# Patient Record
Sex: Female | Born: 1935
Health system: Southern US, Community
[De-identification: ages and names within clinical notes are randomized; demographics above are authoritative.]

## PROBLEM LIST (undated history)

## (undated) DIAGNOSIS — J449 Chronic obstructive pulmonary disease, unspecified: Secondary | ICD-10-CM

## (undated) DIAGNOSIS — K219 Gastro-esophageal reflux disease without esophagitis: Secondary | ICD-10-CM

## (undated) DIAGNOSIS — J45909 Unspecified asthma, uncomplicated: Secondary | ICD-10-CM

## (undated) DIAGNOSIS — C569 Malignant neoplasm of unspecified ovary: Secondary | ICD-10-CM

## (undated) DIAGNOSIS — M1990 Unspecified osteoarthritis, unspecified site: Secondary | ICD-10-CM

## (undated) DIAGNOSIS — E119 Type 2 diabetes mellitus without complications: Secondary | ICD-10-CM

## (undated) DIAGNOSIS — R0600 Dyspnea, unspecified: Secondary | ICD-10-CM

## (undated) DIAGNOSIS — H269 Unspecified cataract: Secondary | ICD-10-CM

## (undated) DIAGNOSIS — F419 Anxiety disorder, unspecified: Secondary | ICD-10-CM

## (undated) DIAGNOSIS — E039 Hypothyroidism, unspecified: Secondary | ICD-10-CM

## (undated) DIAGNOSIS — J189 Pneumonia, unspecified organism: Secondary | ICD-10-CM

## (undated) DIAGNOSIS — F32A Depression, unspecified: Secondary | ICD-10-CM

## (undated) DIAGNOSIS — Z8781 Personal history of (healed) traumatic fracture: Secondary | ICD-10-CM

## (undated) DIAGNOSIS — I10 Essential (primary) hypertension: Secondary | ICD-10-CM

## (undated) DIAGNOSIS — M797 Fibromyalgia: Secondary | ICD-10-CM

## (undated) DIAGNOSIS — F329 Major depressive disorder, single episode, unspecified: Secondary | ICD-10-CM

## (undated) DIAGNOSIS — M810 Age-related osteoporosis without current pathological fracture: Secondary | ICD-10-CM

## (undated) DIAGNOSIS — Z9221 Personal history of antineoplastic chemotherapy: Secondary | ICD-10-CM

## (undated) HISTORY — PX: OTHER SURGICAL HISTORY: SHX169

## (undated) HISTORY — PX: ANKLE FRACTURE SURGERY: SHX122

## (undated) HISTORY — PX: CATARACT EXTRACTION: SUR2

## (undated) HISTORY — PX: EYE SURGERY: SHX253

## (undated) HISTORY — PX: OOPHORECTOMY: SHX86

## (undated) HISTORY — DX: Personal history of antineoplastic chemotherapy: Z92.21

## (undated) HISTORY — PX: LUNG SURGERY: SHX703

## (undated) HISTORY — PX: COLONOSCOPY: SHX174

## (undated) HISTORY — DX: Unspecified osteoarthritis, unspecified site: M19.90

## (undated) HISTORY — DX: Personal history of (healed) traumatic fracture: Z87.81

## (undated) HISTORY — DX: Gastro-esophageal reflux disease without esophagitis: K21.9

## (undated) HISTORY — PX: BLADDER SUSPENSION: SHX72

## (undated) HISTORY — PX: TONSILLECTOMY: SUR1361

## (undated) HISTORY — DX: Age-related osteoporosis without current pathological fracture: M81.0

## (undated) HISTORY — PX: UPPER GI ENDOSCOPY: SHX6162

---
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
## 1994-03-22 HISTORY — PX: ABDOMINAL HYSTERECTOMY: SHX81

## 2000-11-13 LAB — HM DIABETES EYE EXAM

## 2012-09-26 LAB — PULMONARY FUNCTION TEST

## 2013-03-10 DIAGNOSIS — E785 Hyperlipidemia, unspecified: Secondary | ICD-10-CM | POA: Diagnosis not present

## 2013-03-10 DIAGNOSIS — M4800 Spinal stenosis, site unspecified: Secondary | ICD-10-CM | POA: Diagnosis not present

## 2013-03-10 DIAGNOSIS — J449 Chronic obstructive pulmonary disease, unspecified: Secondary | ICD-10-CM | POA: Diagnosis not present

## 2013-03-10 DIAGNOSIS — Z96659 Presence of unspecified artificial knee joint: Secondary | ICD-10-CM | POA: Diagnosis not present

## 2013-03-10 DIAGNOSIS — M353 Polymyalgia rheumatica: Secondary | ICD-10-CM | POA: Diagnosis not present

## 2013-03-10 DIAGNOSIS — Z7901 Long term (current) use of anticoagulants: Secondary | ICD-10-CM | POA: Diagnosis not present

## 2013-03-10 DIAGNOSIS — Z471 Aftercare following joint replacement surgery: Secondary | ICD-10-CM | POA: Diagnosis not present

## 2013-03-10 DIAGNOSIS — E119 Type 2 diabetes mellitus without complications: Secondary | ICD-10-CM | POA: Diagnosis not present

## 2013-03-10 DIAGNOSIS — IMO0001 Reserved for inherently not codable concepts without codable children: Secondary | ICD-10-CM | POA: Diagnosis not present

## 2013-03-10 DIAGNOSIS — I10 Essential (primary) hypertension: Secondary | ICD-10-CM | POA: Diagnosis not present

## 2013-03-10 DIAGNOSIS — J45909 Unspecified asthma, uncomplicated: Secondary | ICD-10-CM | POA: Diagnosis not present

## 2013-03-10 DIAGNOSIS — E039 Hypothyroidism, unspecified: Secondary | ICD-10-CM | POA: Diagnosis not present

## 2013-03-11 DIAGNOSIS — E119 Type 2 diabetes mellitus without complications: Secondary | ICD-10-CM | POA: Diagnosis not present

## 2013-03-11 DIAGNOSIS — J45909 Unspecified asthma, uncomplicated: Secondary | ICD-10-CM | POA: Diagnosis not present

## 2013-03-11 DIAGNOSIS — J449 Chronic obstructive pulmonary disease, unspecified: Secondary | ICD-10-CM | POA: Diagnosis not present

## 2013-03-11 DIAGNOSIS — I10 Essential (primary) hypertension: Secondary | ICD-10-CM | POA: Diagnosis not present

## 2013-03-11 DIAGNOSIS — Z471 Aftercare following joint replacement surgery: Secondary | ICD-10-CM | POA: Diagnosis not present

## 2013-03-11 DIAGNOSIS — M4800 Spinal stenosis, site unspecified: Secondary | ICD-10-CM | POA: Diagnosis not present

## 2013-03-12 DIAGNOSIS — Z471 Aftercare following joint replacement surgery: Secondary | ICD-10-CM | POA: Diagnosis not present

## 2013-03-12 DIAGNOSIS — E119 Type 2 diabetes mellitus without complications: Secondary | ICD-10-CM | POA: Diagnosis not present

## 2013-03-12 DIAGNOSIS — I10 Essential (primary) hypertension: Secondary | ICD-10-CM | POA: Diagnosis not present

## 2013-03-12 DIAGNOSIS — M4800 Spinal stenosis, site unspecified: Secondary | ICD-10-CM | POA: Diagnosis not present

## 2013-03-12 DIAGNOSIS — J45909 Unspecified asthma, uncomplicated: Secondary | ICD-10-CM | POA: Diagnosis not present

## 2013-03-12 DIAGNOSIS — J449 Chronic obstructive pulmonary disease, unspecified: Secondary | ICD-10-CM | POA: Diagnosis not present

## 2013-03-14 DIAGNOSIS — J45909 Unspecified asthma, uncomplicated: Secondary | ICD-10-CM | POA: Diagnosis not present

## 2013-03-14 DIAGNOSIS — Z471 Aftercare following joint replacement surgery: Secondary | ICD-10-CM | POA: Diagnosis not present

## 2013-03-14 DIAGNOSIS — E119 Type 2 diabetes mellitus without complications: Secondary | ICD-10-CM | POA: Diagnosis not present

## 2013-03-14 DIAGNOSIS — J449 Chronic obstructive pulmonary disease, unspecified: Secondary | ICD-10-CM | POA: Diagnosis not present

## 2013-03-14 DIAGNOSIS — I10 Essential (primary) hypertension: Secondary | ICD-10-CM | POA: Diagnosis not present

## 2013-03-14 DIAGNOSIS — M4800 Spinal stenosis, site unspecified: Secondary | ICD-10-CM | POA: Diagnosis not present

## 2013-03-16 DIAGNOSIS — J45909 Unspecified asthma, uncomplicated: Secondary | ICD-10-CM | POA: Diagnosis not present

## 2013-03-16 DIAGNOSIS — Z471 Aftercare following joint replacement surgery: Secondary | ICD-10-CM | POA: Diagnosis not present

## 2013-03-16 DIAGNOSIS — M4800 Spinal stenosis, site unspecified: Secondary | ICD-10-CM | POA: Diagnosis not present

## 2013-03-16 DIAGNOSIS — E119 Type 2 diabetes mellitus without complications: Secondary | ICD-10-CM | POA: Diagnosis not present

## 2013-03-16 DIAGNOSIS — J449 Chronic obstructive pulmonary disease, unspecified: Secondary | ICD-10-CM | POA: Diagnosis not present

## 2013-03-16 DIAGNOSIS — I10 Essential (primary) hypertension: Secondary | ICD-10-CM | POA: Diagnosis not present

## 2013-03-17 DIAGNOSIS — J45909 Unspecified asthma, uncomplicated: Secondary | ICD-10-CM | POA: Diagnosis not present

## 2013-03-17 DIAGNOSIS — I10 Essential (primary) hypertension: Secondary | ICD-10-CM | POA: Diagnosis not present

## 2013-03-17 DIAGNOSIS — J449 Chronic obstructive pulmonary disease, unspecified: Secondary | ICD-10-CM | POA: Diagnosis not present

## 2013-03-17 DIAGNOSIS — Z471 Aftercare following joint replacement surgery: Secondary | ICD-10-CM | POA: Diagnosis not present

## 2013-03-17 DIAGNOSIS — E119 Type 2 diabetes mellitus without complications: Secondary | ICD-10-CM | POA: Diagnosis not present

## 2013-03-17 DIAGNOSIS — M4800 Spinal stenosis, site unspecified: Secondary | ICD-10-CM | POA: Diagnosis not present

## 2013-03-19 DIAGNOSIS — J45909 Unspecified asthma, uncomplicated: Secondary | ICD-10-CM | POA: Diagnosis not present

## 2013-03-19 DIAGNOSIS — I10 Essential (primary) hypertension: Secondary | ICD-10-CM | POA: Diagnosis not present

## 2013-03-19 DIAGNOSIS — E119 Type 2 diabetes mellitus without complications: Secondary | ICD-10-CM | POA: Diagnosis not present

## 2013-03-19 DIAGNOSIS — M4800 Spinal stenosis, site unspecified: Secondary | ICD-10-CM | POA: Diagnosis not present

## 2013-03-19 DIAGNOSIS — Z471 Aftercare following joint replacement surgery: Secondary | ICD-10-CM | POA: Diagnosis not present

## 2013-03-19 DIAGNOSIS — J449 Chronic obstructive pulmonary disease, unspecified: Secondary | ICD-10-CM | POA: Diagnosis not present

## 2013-03-21 DIAGNOSIS — J45909 Unspecified asthma, uncomplicated: Secondary | ICD-10-CM | POA: Diagnosis not present

## 2013-03-21 DIAGNOSIS — E119 Type 2 diabetes mellitus without complications: Secondary | ICD-10-CM | POA: Diagnosis not present

## 2013-03-21 DIAGNOSIS — J449 Chronic obstructive pulmonary disease, unspecified: Secondary | ICD-10-CM | POA: Diagnosis not present

## 2013-03-21 DIAGNOSIS — M4800 Spinal stenosis, site unspecified: Secondary | ICD-10-CM | POA: Diagnosis not present

## 2013-03-21 DIAGNOSIS — I10 Essential (primary) hypertension: Secondary | ICD-10-CM | POA: Diagnosis not present

## 2013-03-21 DIAGNOSIS — Z471 Aftercare following joint replacement surgery: Secondary | ICD-10-CM | POA: Diagnosis not present

## 2013-03-23 DIAGNOSIS — K922 Gastrointestinal hemorrhage, unspecified: Secondary | ICD-10-CM | POA: Diagnosis not present

## 2013-03-23 DIAGNOSIS — J449 Chronic obstructive pulmonary disease, unspecified: Secondary | ICD-10-CM | POA: Diagnosis not present

## 2013-03-23 DIAGNOSIS — E119 Type 2 diabetes mellitus without complications: Secondary | ICD-10-CM | POA: Diagnosis not present

## 2013-03-23 DIAGNOSIS — D649 Anemia, unspecified: Secondary | ICD-10-CM | POA: Diagnosis not present

## 2013-03-23 DIAGNOSIS — I10 Essential (primary) hypertension: Secondary | ICD-10-CM | POA: Diagnosis not present

## 2013-03-23 DIAGNOSIS — E7800 Pure hypercholesterolemia, unspecified: Secondary | ICD-10-CM | POA: Diagnosis not present

## 2013-03-23 DIAGNOSIS — M4800 Spinal stenosis, site unspecified: Secondary | ICD-10-CM | POA: Diagnosis not present

## 2013-03-23 DIAGNOSIS — Z96659 Presence of unspecified artificial knee joint: Secondary | ICD-10-CM | POA: Diagnosis not present

## 2013-03-23 DIAGNOSIS — J45909 Unspecified asthma, uncomplicated: Secondary | ICD-10-CM | POA: Diagnosis not present

## 2013-03-23 DIAGNOSIS — K625 Hemorrhage of anus and rectum: Secondary | ICD-10-CM | POA: Diagnosis not present

## 2013-03-23 DIAGNOSIS — Z794 Long term (current) use of insulin: Secondary | ICD-10-CM | POA: Diagnosis not present

## 2013-03-23 DIAGNOSIS — Z471 Aftercare following joint replacement surgery: Secondary | ICD-10-CM | POA: Diagnosis not present

## 2013-03-23 DIAGNOSIS — R109 Unspecified abdominal pain: Secondary | ICD-10-CM | POA: Diagnosis not present

## 2013-03-23 DIAGNOSIS — K5289 Other specified noninfective gastroenteritis and colitis: Secondary | ICD-10-CM | POA: Diagnosis not present

## 2013-03-26 DIAGNOSIS — K5289 Other specified noninfective gastroenteritis and colitis: Secondary | ICD-10-CM | POA: Diagnosis not present

## 2013-03-28 DIAGNOSIS — E119 Type 2 diabetes mellitus without complications: Secondary | ICD-10-CM | POA: Diagnosis not present

## 2013-03-28 DIAGNOSIS — M4800 Spinal stenosis, site unspecified: Secondary | ICD-10-CM | POA: Diagnosis not present

## 2013-03-28 DIAGNOSIS — J449 Chronic obstructive pulmonary disease, unspecified: Secondary | ICD-10-CM | POA: Diagnosis not present

## 2013-03-28 DIAGNOSIS — I10 Essential (primary) hypertension: Secondary | ICD-10-CM | POA: Diagnosis not present

## 2013-03-28 DIAGNOSIS — Z471 Aftercare following joint replacement surgery: Secondary | ICD-10-CM | POA: Diagnosis not present

## 2013-03-28 DIAGNOSIS — J45909 Unspecified asthma, uncomplicated: Secondary | ICD-10-CM | POA: Diagnosis not present

## 2013-03-30 DIAGNOSIS — E119 Type 2 diabetes mellitus without complications: Secondary | ICD-10-CM | POA: Diagnosis not present

## 2013-03-30 DIAGNOSIS — K5229 Other allergic and dietetic gastroenteritis and colitis: Secondary | ICD-10-CM | POA: Diagnosis not present

## 2013-03-30 DIAGNOSIS — Z471 Aftercare following joint replacement surgery: Secondary | ICD-10-CM | POA: Diagnosis not present

## 2013-03-30 DIAGNOSIS — M4800 Spinal stenosis, site unspecified: Secondary | ICD-10-CM | POA: Diagnosis not present

## 2013-03-30 DIAGNOSIS — I10 Essential (primary) hypertension: Secondary | ICD-10-CM | POA: Diagnosis not present

## 2013-03-30 DIAGNOSIS — J45909 Unspecified asthma, uncomplicated: Secondary | ICD-10-CM | POA: Diagnosis not present

## 2013-03-30 DIAGNOSIS — J449 Chronic obstructive pulmonary disease, unspecified: Secondary | ICD-10-CM | POA: Diagnosis not present

## 2013-04-02 DIAGNOSIS — I10 Essential (primary) hypertension: Secondary | ICD-10-CM | POA: Diagnosis not present

## 2013-04-02 DIAGNOSIS — M479 Spondylosis, unspecified: Secondary | ICD-10-CM | POA: Diagnosis not present

## 2013-04-02 DIAGNOSIS — IMO0001 Reserved for inherently not codable concepts without codable children: Secondary | ICD-10-CM | POA: Diagnosis not present

## 2013-04-02 DIAGNOSIS — R197 Diarrhea, unspecified: Secondary | ICD-10-CM | POA: Diagnosis not present

## 2013-04-02 DIAGNOSIS — E782 Mixed hyperlipidemia: Secondary | ICD-10-CM | POA: Diagnosis not present

## 2013-04-02 DIAGNOSIS — Z794 Long term (current) use of insulin: Secondary | ICD-10-CM | POA: Diagnosis not present

## 2013-04-02 DIAGNOSIS — K5289 Other specified noninfective gastroenteritis and colitis: Secondary | ICD-10-CM | POA: Diagnosis not present

## 2013-04-03 DIAGNOSIS — M4800 Spinal stenosis, site unspecified: Secondary | ICD-10-CM | POA: Diagnosis not present

## 2013-04-03 DIAGNOSIS — J449 Chronic obstructive pulmonary disease, unspecified: Secondary | ICD-10-CM | POA: Diagnosis not present

## 2013-04-03 DIAGNOSIS — E119 Type 2 diabetes mellitus without complications: Secondary | ICD-10-CM | POA: Diagnosis not present

## 2013-04-03 DIAGNOSIS — Z471 Aftercare following joint replacement surgery: Secondary | ICD-10-CM | POA: Diagnosis not present

## 2013-04-03 DIAGNOSIS — J45909 Unspecified asthma, uncomplicated: Secondary | ICD-10-CM | POA: Diagnosis not present

## 2013-04-03 DIAGNOSIS — I10 Essential (primary) hypertension: Secondary | ICD-10-CM | POA: Diagnosis not present

## 2013-04-04 DIAGNOSIS — K922 Gastrointestinal hemorrhage, unspecified: Secondary | ICD-10-CM | POA: Diagnosis not present

## 2013-04-04 DIAGNOSIS — K5730 Diverticulosis of large intestine without perforation or abscess without bleeding: Secondary | ICD-10-CM | POA: Diagnosis not present

## 2013-04-04 DIAGNOSIS — D126 Benign neoplasm of colon, unspecified: Secondary | ICD-10-CM | POA: Diagnosis not present

## 2013-04-04 DIAGNOSIS — K648 Other hemorrhoids: Secondary | ICD-10-CM | POA: Diagnosis not present

## 2013-04-04 DIAGNOSIS — R197 Diarrhea, unspecified: Secondary | ICD-10-CM | POA: Diagnosis not present

## 2013-04-04 DIAGNOSIS — I10 Essential (primary) hypertension: Secondary | ICD-10-CM | POA: Diagnosis not present

## 2013-04-07 DIAGNOSIS — J449 Chronic obstructive pulmonary disease, unspecified: Secondary | ICD-10-CM | POA: Diagnosis not present

## 2013-04-07 DIAGNOSIS — E119 Type 2 diabetes mellitus without complications: Secondary | ICD-10-CM | POA: Diagnosis not present

## 2013-04-07 DIAGNOSIS — J45909 Unspecified asthma, uncomplicated: Secondary | ICD-10-CM | POA: Diagnosis not present

## 2013-04-07 DIAGNOSIS — Z471 Aftercare following joint replacement surgery: Secondary | ICD-10-CM | POA: Diagnosis not present

## 2013-04-07 DIAGNOSIS — I10 Essential (primary) hypertension: Secondary | ICD-10-CM | POA: Diagnosis not present

## 2013-04-07 DIAGNOSIS — M4800 Spinal stenosis, site unspecified: Secondary | ICD-10-CM | POA: Diagnosis not present

## 2013-04-10 DIAGNOSIS — J45909 Unspecified asthma, uncomplicated: Secondary | ICD-10-CM | POA: Diagnosis not present

## 2013-04-11 DIAGNOSIS — M4800 Spinal stenosis, site unspecified: Secondary | ICD-10-CM | POA: Diagnosis not present

## 2013-04-11 DIAGNOSIS — J449 Chronic obstructive pulmonary disease, unspecified: Secondary | ICD-10-CM | POA: Diagnosis not present

## 2013-04-11 DIAGNOSIS — I10 Essential (primary) hypertension: Secondary | ICD-10-CM | POA: Diagnosis not present

## 2013-04-11 DIAGNOSIS — J45909 Unspecified asthma, uncomplicated: Secondary | ICD-10-CM | POA: Diagnosis not present

## 2013-04-11 DIAGNOSIS — Z471 Aftercare following joint replacement surgery: Secondary | ICD-10-CM | POA: Diagnosis not present

## 2013-04-11 DIAGNOSIS — E119 Type 2 diabetes mellitus without complications: Secondary | ICD-10-CM | POA: Diagnosis not present

## 2013-04-12 DIAGNOSIS — R197 Diarrhea, unspecified: Secondary | ICD-10-CM | POA: Diagnosis not present

## 2013-04-13 DIAGNOSIS — K5289 Other specified noninfective gastroenteritis and colitis: Secondary | ICD-10-CM | POA: Diagnosis not present

## 2013-04-13 DIAGNOSIS — I10 Essential (primary) hypertension: Secondary | ICD-10-CM | POA: Diagnosis not present

## 2013-04-13 DIAGNOSIS — R5381 Other malaise: Secondary | ICD-10-CM | POA: Diagnosis not present

## 2013-04-13 DIAGNOSIS — D126 Benign neoplasm of colon, unspecified: Secondary | ICD-10-CM | POA: Diagnosis not present

## 2013-04-13 DIAGNOSIS — R5383 Other fatigue: Secondary | ICD-10-CM | POA: Diagnosis not present

## 2013-04-14 DIAGNOSIS — E119 Type 2 diabetes mellitus without complications: Secondary | ICD-10-CM | POA: Diagnosis not present

## 2013-04-14 DIAGNOSIS — J45909 Unspecified asthma, uncomplicated: Secondary | ICD-10-CM | POA: Diagnosis not present

## 2013-04-14 DIAGNOSIS — J449 Chronic obstructive pulmonary disease, unspecified: Secondary | ICD-10-CM | POA: Diagnosis not present

## 2013-04-14 DIAGNOSIS — I10 Essential (primary) hypertension: Secondary | ICD-10-CM | POA: Diagnosis not present

## 2013-04-14 DIAGNOSIS — M4800 Spinal stenosis, site unspecified: Secondary | ICD-10-CM | POA: Diagnosis not present

## 2013-04-14 DIAGNOSIS — Z471 Aftercare following joint replacement surgery: Secondary | ICD-10-CM | POA: Diagnosis not present

## 2013-04-16 DIAGNOSIS — M25539 Pain in unspecified wrist: Secondary | ICD-10-CM | POA: Diagnosis not present

## 2013-04-16 DIAGNOSIS — S1093XA Contusion of unspecified part of neck, initial encounter: Secondary | ICD-10-CM | POA: Diagnosis not present

## 2013-04-16 DIAGNOSIS — S59919A Unspecified injury of unspecified forearm, initial encounter: Secondary | ICD-10-CM | POA: Diagnosis not present

## 2013-04-16 DIAGNOSIS — S59909A Unspecified injury of unspecified elbow, initial encounter: Secondary | ICD-10-CM | POA: Diagnosis not present

## 2013-04-16 DIAGNOSIS — R079 Chest pain, unspecified: Secondary | ICD-10-CM | POA: Diagnosis not present

## 2013-04-16 DIAGNOSIS — I10 Essential (primary) hypertension: Secondary | ICD-10-CM | POA: Diagnosis not present

## 2013-04-16 DIAGNOSIS — R269 Unspecified abnormalities of gait and mobility: Secondary | ICD-10-CM | POA: Diagnosis not present

## 2013-04-16 DIAGNOSIS — G319 Degenerative disease of nervous system, unspecified: Secondary | ICD-10-CM | POA: Diagnosis not present

## 2013-04-16 DIAGNOSIS — R279 Unspecified lack of coordination: Secondary | ICD-10-CM | POA: Diagnosis not present

## 2013-04-16 DIAGNOSIS — S0990XA Unspecified injury of head, initial encounter: Secondary | ICD-10-CM | POA: Diagnosis not present

## 2013-04-16 DIAGNOSIS — S6990XA Unspecified injury of unspecified wrist, hand and finger(s), initial encounter: Secondary | ICD-10-CM | POA: Diagnosis not present

## 2013-04-16 DIAGNOSIS — S0003XA Contusion of scalp, initial encounter: Secondary | ICD-10-CM | POA: Diagnosis not present

## 2013-04-17 DIAGNOSIS — F332 Major depressive disorder, recurrent severe without psychotic features: Secondary | ICD-10-CM | POA: Diagnosis not present

## 2013-04-18 DIAGNOSIS — M4800 Spinal stenosis, site unspecified: Secondary | ICD-10-CM | POA: Diagnosis not present

## 2013-04-18 DIAGNOSIS — J449 Chronic obstructive pulmonary disease, unspecified: Secondary | ICD-10-CM | POA: Diagnosis not present

## 2013-04-18 DIAGNOSIS — I10 Essential (primary) hypertension: Secondary | ICD-10-CM | POA: Diagnosis not present

## 2013-04-18 DIAGNOSIS — Z471 Aftercare following joint replacement surgery: Secondary | ICD-10-CM | POA: Diagnosis not present

## 2013-04-18 DIAGNOSIS — E119 Type 2 diabetes mellitus without complications: Secondary | ICD-10-CM | POA: Diagnosis not present

## 2013-04-18 DIAGNOSIS — J45909 Unspecified asthma, uncomplicated: Secondary | ICD-10-CM | POA: Diagnosis not present

## 2013-04-20 DIAGNOSIS — M1710 Unilateral primary osteoarthritis, unspecified knee: Secondary | ICD-10-CM | POA: Diagnosis not present

## 2013-04-24 DIAGNOSIS — M4800 Spinal stenosis, site unspecified: Secondary | ICD-10-CM | POA: Diagnosis not present

## 2013-04-24 DIAGNOSIS — Z471 Aftercare following joint replacement surgery: Secondary | ICD-10-CM | POA: Diagnosis not present

## 2013-04-24 DIAGNOSIS — I10 Essential (primary) hypertension: Secondary | ICD-10-CM | POA: Diagnosis not present

## 2013-04-24 DIAGNOSIS — E119 Type 2 diabetes mellitus without complications: Secondary | ICD-10-CM | POA: Diagnosis not present

## 2013-04-24 DIAGNOSIS — J45909 Unspecified asthma, uncomplicated: Secondary | ICD-10-CM | POA: Diagnosis not present

## 2013-04-24 DIAGNOSIS — J449 Chronic obstructive pulmonary disease, unspecified: Secondary | ICD-10-CM | POA: Diagnosis not present

## 2013-04-26 DIAGNOSIS — E119 Type 2 diabetes mellitus without complications: Secondary | ICD-10-CM | POA: Diagnosis not present

## 2013-04-26 DIAGNOSIS — J449 Chronic obstructive pulmonary disease, unspecified: Secondary | ICD-10-CM | POA: Diagnosis not present

## 2013-04-26 DIAGNOSIS — J45909 Unspecified asthma, uncomplicated: Secondary | ICD-10-CM | POA: Diagnosis not present

## 2013-04-26 DIAGNOSIS — I10 Essential (primary) hypertension: Secondary | ICD-10-CM | POA: Diagnosis not present

## 2013-04-26 DIAGNOSIS — Z471 Aftercare following joint replacement surgery: Secondary | ICD-10-CM | POA: Diagnosis not present

## 2013-04-26 DIAGNOSIS — M4800 Spinal stenosis, site unspecified: Secondary | ICD-10-CM | POA: Diagnosis not present

## 2013-04-30 DIAGNOSIS — S52599A Other fractures of lower end of unspecified radius, initial encounter for closed fracture: Secondary | ICD-10-CM | POA: Diagnosis not present

## 2013-04-30 DIAGNOSIS — R079 Chest pain, unspecified: Secondary | ICD-10-CM | POA: Diagnosis not present

## 2013-05-01 DIAGNOSIS — J449 Chronic obstructive pulmonary disease, unspecified: Secondary | ICD-10-CM | POA: Diagnosis not present

## 2013-05-01 DIAGNOSIS — E119 Type 2 diabetes mellitus without complications: Secondary | ICD-10-CM | POA: Diagnosis not present

## 2013-05-01 DIAGNOSIS — I10 Essential (primary) hypertension: Secondary | ICD-10-CM | POA: Diagnosis not present

## 2013-05-01 DIAGNOSIS — M4800 Spinal stenosis, site unspecified: Secondary | ICD-10-CM | POA: Diagnosis not present

## 2013-05-01 DIAGNOSIS — J45909 Unspecified asthma, uncomplicated: Secondary | ICD-10-CM | POA: Diagnosis not present

## 2013-05-01 DIAGNOSIS — Z471 Aftercare following joint replacement surgery: Secondary | ICD-10-CM | POA: Diagnosis not present

## 2013-05-02 DIAGNOSIS — J45909 Unspecified asthma, uncomplicated: Secondary | ICD-10-CM | POA: Diagnosis not present

## 2013-05-02 DIAGNOSIS — E119 Type 2 diabetes mellitus without complications: Secondary | ICD-10-CM | POA: Diagnosis not present

## 2013-05-02 DIAGNOSIS — Z471 Aftercare following joint replacement surgery: Secondary | ICD-10-CM | POA: Diagnosis not present

## 2013-05-02 DIAGNOSIS — M4800 Spinal stenosis, site unspecified: Secondary | ICD-10-CM | POA: Diagnosis not present

## 2013-05-02 DIAGNOSIS — I10 Essential (primary) hypertension: Secondary | ICD-10-CM | POA: Diagnosis not present

## 2013-05-02 DIAGNOSIS — J449 Chronic obstructive pulmonary disease, unspecified: Secondary | ICD-10-CM | POA: Diagnosis not present

## 2013-05-07 DIAGNOSIS — M899 Disorder of bone, unspecified: Secondary | ICD-10-CM | POA: Diagnosis not present

## 2013-05-07 DIAGNOSIS — M353 Polymyalgia rheumatica: Secondary | ICD-10-CM | POA: Diagnosis not present

## 2013-05-07 DIAGNOSIS — M1990 Unspecified osteoarthritis, unspecified site: Secondary | ICD-10-CM | POA: Diagnosis not present

## 2013-05-07 DIAGNOSIS — M949 Disorder of cartilage, unspecified: Secondary | ICD-10-CM | POA: Diagnosis not present

## 2013-05-08 DIAGNOSIS — I10 Essential (primary) hypertension: Secondary | ICD-10-CM | POA: Diagnosis not present

## 2013-05-08 DIAGNOSIS — R079 Chest pain, unspecified: Secondary | ICD-10-CM | POA: Diagnosis not present

## 2013-05-08 DIAGNOSIS — E119 Type 2 diabetes mellitus without complications: Secondary | ICD-10-CM | POA: Diagnosis not present

## 2013-05-08 DIAGNOSIS — IMO0001 Reserved for inherently not codable concepts without codable children: Secondary | ICD-10-CM | POA: Diagnosis not present

## 2013-05-08 DIAGNOSIS — E041 Nontoxic single thyroid nodule: Secondary | ICD-10-CM | POA: Diagnosis not present

## 2013-05-08 DIAGNOSIS — J45909 Unspecified asthma, uncomplicated: Secondary | ICD-10-CM | POA: Diagnosis not present

## 2013-05-08 DIAGNOSIS — M4800 Spinal stenosis, site unspecified: Secondary | ICD-10-CM | POA: Diagnosis not present

## 2013-05-08 DIAGNOSIS — M25539 Pain in unspecified wrist: Secondary | ICD-10-CM | POA: Diagnosis not present

## 2013-05-08 DIAGNOSIS — J449 Chronic obstructive pulmonary disease, unspecified: Secondary | ICD-10-CM | POA: Diagnosis not present

## 2013-05-08 DIAGNOSIS — Z471 Aftercare following joint replacement surgery: Secondary | ICD-10-CM | POA: Diagnosis not present

## 2013-05-08 DIAGNOSIS — Z794 Long term (current) use of insulin: Secondary | ICD-10-CM | POA: Diagnosis not present

## 2013-05-08 DIAGNOSIS — E039 Hypothyroidism, unspecified: Secondary | ICD-10-CM | POA: Diagnosis not present

## 2013-05-09 DIAGNOSIS — E785 Hyperlipidemia, unspecified: Secondary | ICD-10-CM | POA: Diagnosis not present

## 2013-05-09 DIAGNOSIS — E039 Hypothyroidism, unspecified: Secondary | ICD-10-CM | POA: Diagnosis not present

## 2013-05-09 DIAGNOSIS — J449 Chronic obstructive pulmonary disease, unspecified: Secondary | ICD-10-CM | POA: Diagnosis not present

## 2013-05-09 DIAGNOSIS — J45909 Unspecified asthma, uncomplicated: Secondary | ICD-10-CM | POA: Diagnosis not present

## 2013-05-09 DIAGNOSIS — IMO0001 Reserved for inherently not codable concepts without codable children: Secondary | ICD-10-CM | POA: Diagnosis not present

## 2013-05-09 DIAGNOSIS — E119 Type 2 diabetes mellitus without complications: Secondary | ICD-10-CM | POA: Diagnosis not present

## 2013-05-09 DIAGNOSIS — M353 Polymyalgia rheumatica: Secondary | ICD-10-CM | POA: Diagnosis not present

## 2013-05-09 DIAGNOSIS — I10 Essential (primary) hypertension: Secondary | ICD-10-CM | POA: Diagnosis not present

## 2013-05-09 DIAGNOSIS — Z471 Aftercare following joint replacement surgery: Secondary | ICD-10-CM | POA: Diagnosis not present

## 2013-05-09 DIAGNOSIS — Z96659 Presence of unspecified artificial knee joint: Secondary | ICD-10-CM | POA: Diagnosis not present

## 2013-05-09 DIAGNOSIS — M4800 Spinal stenosis, site unspecified: Secondary | ICD-10-CM | POA: Diagnosis not present

## 2013-05-09 DIAGNOSIS — Z7901 Long term (current) use of anticoagulants: Secondary | ICD-10-CM | POA: Diagnosis not present

## 2013-05-10 DIAGNOSIS — J45909 Unspecified asthma, uncomplicated: Secondary | ICD-10-CM | POA: Diagnosis not present

## 2013-05-10 DIAGNOSIS — M4800 Spinal stenosis, site unspecified: Secondary | ICD-10-CM | POA: Diagnosis not present

## 2013-05-10 DIAGNOSIS — Z471 Aftercare following joint replacement surgery: Secondary | ICD-10-CM | POA: Diagnosis not present

## 2013-05-10 DIAGNOSIS — I10 Essential (primary) hypertension: Secondary | ICD-10-CM | POA: Diagnosis not present

## 2013-05-10 DIAGNOSIS — J449 Chronic obstructive pulmonary disease, unspecified: Secondary | ICD-10-CM | POA: Diagnosis not present

## 2013-05-10 DIAGNOSIS — E119 Type 2 diabetes mellitus without complications: Secondary | ICD-10-CM | POA: Diagnosis not present

## 2013-05-15 DIAGNOSIS — I10 Essential (primary) hypertension: Secondary | ICD-10-CM | POA: Diagnosis not present

## 2013-05-15 DIAGNOSIS — J45909 Unspecified asthma, uncomplicated: Secondary | ICD-10-CM | POA: Diagnosis not present

## 2013-05-15 DIAGNOSIS — J449 Chronic obstructive pulmonary disease, unspecified: Secondary | ICD-10-CM | POA: Diagnosis not present

## 2013-05-15 DIAGNOSIS — Z471 Aftercare following joint replacement surgery: Secondary | ICD-10-CM | POA: Diagnosis not present

## 2013-05-15 DIAGNOSIS — M4800 Spinal stenosis, site unspecified: Secondary | ICD-10-CM | POA: Diagnosis not present

## 2013-05-15 DIAGNOSIS — E119 Type 2 diabetes mellitus without complications: Secondary | ICD-10-CM | POA: Diagnosis not present

## 2013-05-16 DIAGNOSIS — J449 Chronic obstructive pulmonary disease, unspecified: Secondary | ICD-10-CM | POA: Diagnosis not present

## 2013-05-16 DIAGNOSIS — M4800 Spinal stenosis, site unspecified: Secondary | ICD-10-CM | POA: Diagnosis not present

## 2013-05-16 DIAGNOSIS — Z471 Aftercare following joint replacement surgery: Secondary | ICD-10-CM | POA: Diagnosis not present

## 2013-05-16 DIAGNOSIS — J45909 Unspecified asthma, uncomplicated: Secondary | ICD-10-CM | POA: Diagnosis not present

## 2013-05-16 DIAGNOSIS — E119 Type 2 diabetes mellitus without complications: Secondary | ICD-10-CM | POA: Diagnosis not present

## 2013-05-16 DIAGNOSIS — I10 Essential (primary) hypertension: Secondary | ICD-10-CM | POA: Diagnosis not present

## 2013-05-18 DIAGNOSIS — Z794 Long term (current) use of insulin: Secondary | ICD-10-CM | POA: Diagnosis not present

## 2013-05-18 DIAGNOSIS — E041 Nontoxic single thyroid nodule: Secondary | ICD-10-CM | POA: Diagnosis not present

## 2013-05-18 DIAGNOSIS — IMO0001 Reserved for inherently not codable concepts without codable children: Secondary | ICD-10-CM | POA: Diagnosis not present

## 2013-05-21 DIAGNOSIS — I10 Essential (primary) hypertension: Secondary | ICD-10-CM | POA: Diagnosis not present

## 2013-05-21 DIAGNOSIS — J449 Chronic obstructive pulmonary disease, unspecified: Secondary | ICD-10-CM | POA: Diagnosis not present

## 2013-05-21 DIAGNOSIS — M4800 Spinal stenosis, site unspecified: Secondary | ICD-10-CM | POA: Diagnosis not present

## 2013-05-21 DIAGNOSIS — Z471 Aftercare following joint replacement surgery: Secondary | ICD-10-CM | POA: Diagnosis not present

## 2013-05-21 DIAGNOSIS — E119 Type 2 diabetes mellitus without complications: Secondary | ICD-10-CM | POA: Diagnosis not present

## 2013-05-21 DIAGNOSIS — J45909 Unspecified asthma, uncomplicated: Secondary | ICD-10-CM | POA: Diagnosis not present

## 2013-05-23 DIAGNOSIS — J449 Chronic obstructive pulmonary disease, unspecified: Secondary | ICD-10-CM | POA: Diagnosis not present

## 2013-05-23 DIAGNOSIS — J45909 Unspecified asthma, uncomplicated: Secondary | ICD-10-CM | POA: Diagnosis not present

## 2013-05-23 DIAGNOSIS — I10 Essential (primary) hypertension: Secondary | ICD-10-CM | POA: Diagnosis not present

## 2013-05-23 DIAGNOSIS — Z471 Aftercare following joint replacement surgery: Secondary | ICD-10-CM | POA: Diagnosis not present

## 2013-05-23 DIAGNOSIS — E119 Type 2 diabetes mellitus without complications: Secondary | ICD-10-CM | POA: Diagnosis not present

## 2013-05-23 DIAGNOSIS — M4800 Spinal stenosis, site unspecified: Secondary | ICD-10-CM | POA: Diagnosis not present

## 2013-05-25 DIAGNOSIS — J449 Chronic obstructive pulmonary disease, unspecified: Secondary | ICD-10-CM | POA: Diagnosis not present

## 2013-05-25 DIAGNOSIS — E119 Type 2 diabetes mellitus without complications: Secondary | ICD-10-CM | POA: Diagnosis not present

## 2013-05-25 DIAGNOSIS — I10 Essential (primary) hypertension: Secondary | ICD-10-CM | POA: Diagnosis not present

## 2013-05-25 DIAGNOSIS — M4800 Spinal stenosis, site unspecified: Secondary | ICD-10-CM | POA: Diagnosis not present

## 2013-05-25 DIAGNOSIS — Z471 Aftercare following joint replacement surgery: Secondary | ICD-10-CM | POA: Diagnosis not present

## 2013-05-25 DIAGNOSIS — J45909 Unspecified asthma, uncomplicated: Secondary | ICD-10-CM | POA: Diagnosis not present

## 2013-05-29 DIAGNOSIS — M4800 Spinal stenosis, site unspecified: Secondary | ICD-10-CM | POA: Diagnosis not present

## 2013-05-29 DIAGNOSIS — H04129 Dry eye syndrome of unspecified lacrimal gland: Secondary | ICD-10-CM | POA: Diagnosis not present

## 2013-05-29 DIAGNOSIS — Z471 Aftercare following joint replacement surgery: Secondary | ICD-10-CM | POA: Diagnosis not present

## 2013-05-29 DIAGNOSIS — E119 Type 2 diabetes mellitus without complications: Secondary | ICD-10-CM | POA: Diagnosis not present

## 2013-05-29 DIAGNOSIS — H259 Unspecified age-related cataract: Secondary | ICD-10-CM | POA: Diagnosis not present

## 2013-05-29 DIAGNOSIS — H16109 Unspecified superficial keratitis, unspecified eye: Secondary | ICD-10-CM | POA: Diagnosis not present

## 2013-05-29 DIAGNOSIS — I10 Essential (primary) hypertension: Secondary | ICD-10-CM | POA: Diagnosis not present

## 2013-05-29 DIAGNOSIS — J449 Chronic obstructive pulmonary disease, unspecified: Secondary | ICD-10-CM | POA: Diagnosis not present

## 2013-05-29 DIAGNOSIS — H43399 Other vitreous opacities, unspecified eye: Secondary | ICD-10-CM | POA: Diagnosis not present

## 2013-05-29 DIAGNOSIS — J45909 Unspecified asthma, uncomplicated: Secondary | ICD-10-CM | POA: Diagnosis not present

## 2013-05-30 DIAGNOSIS — J45909 Unspecified asthma, uncomplicated: Secondary | ICD-10-CM | POA: Diagnosis not present

## 2013-05-30 DIAGNOSIS — Z96659 Presence of unspecified artificial knee joint: Secondary | ICD-10-CM | POA: Diagnosis not present

## 2013-05-30 DIAGNOSIS — J449 Chronic obstructive pulmonary disease, unspecified: Secondary | ICD-10-CM | POA: Diagnosis not present

## 2013-05-30 DIAGNOSIS — Z471 Aftercare following joint replacement surgery: Secondary | ICD-10-CM | POA: Diagnosis not present

## 2013-05-30 DIAGNOSIS — E119 Type 2 diabetes mellitus without complications: Secondary | ICD-10-CM | POA: Diagnosis not present

## 2013-05-30 DIAGNOSIS — M4800 Spinal stenosis, site unspecified: Secondary | ICD-10-CM | POA: Diagnosis not present

## 2013-05-30 DIAGNOSIS — I10 Essential (primary) hypertension: Secondary | ICD-10-CM | POA: Diagnosis not present

## 2013-05-30 DIAGNOSIS — M1710 Unilateral primary osteoarthritis, unspecified knee: Secondary | ICD-10-CM | POA: Diagnosis not present

## 2013-05-31 DIAGNOSIS — I10 Essential (primary) hypertension: Secondary | ICD-10-CM | POA: Diagnosis not present

## 2013-05-31 DIAGNOSIS — E782 Mixed hyperlipidemia: Secondary | ICD-10-CM | POA: Diagnosis not present

## 2013-05-31 DIAGNOSIS — J45901 Unspecified asthma with (acute) exacerbation: Secondary | ICD-10-CM | POA: Diagnosis not present

## 2013-05-31 DIAGNOSIS — IMO0001 Reserved for inherently not codable concepts without codable children: Secondary | ICD-10-CM | POA: Diagnosis not present

## 2013-06-01 DIAGNOSIS — Z471 Aftercare following joint replacement surgery: Secondary | ICD-10-CM | POA: Diagnosis not present

## 2013-06-01 DIAGNOSIS — J45909 Unspecified asthma, uncomplicated: Secondary | ICD-10-CM | POA: Diagnosis not present

## 2013-06-01 DIAGNOSIS — J449 Chronic obstructive pulmonary disease, unspecified: Secondary | ICD-10-CM | POA: Diagnosis not present

## 2013-06-01 DIAGNOSIS — I10 Essential (primary) hypertension: Secondary | ICD-10-CM | POA: Diagnosis not present

## 2013-06-01 DIAGNOSIS — E119 Type 2 diabetes mellitus without complications: Secondary | ICD-10-CM | POA: Diagnosis not present

## 2013-06-01 DIAGNOSIS — M4800 Spinal stenosis, site unspecified: Secondary | ICD-10-CM | POA: Diagnosis not present

## 2013-06-07 DIAGNOSIS — J449 Chronic obstructive pulmonary disease, unspecified: Secondary | ICD-10-CM | POA: Diagnosis not present

## 2013-06-07 DIAGNOSIS — M4800 Spinal stenosis, site unspecified: Secondary | ICD-10-CM | POA: Diagnosis not present

## 2013-06-07 DIAGNOSIS — I10 Essential (primary) hypertension: Secondary | ICD-10-CM | POA: Diagnosis not present

## 2013-06-07 DIAGNOSIS — J45909 Unspecified asthma, uncomplicated: Secondary | ICD-10-CM | POA: Diagnosis not present

## 2013-06-07 DIAGNOSIS — E119 Type 2 diabetes mellitus without complications: Secondary | ICD-10-CM | POA: Diagnosis not present

## 2013-06-07 DIAGNOSIS — Z471 Aftercare following joint replacement surgery: Secondary | ICD-10-CM | POA: Diagnosis not present

## 2013-06-12 DIAGNOSIS — I10 Essential (primary) hypertension: Secondary | ICD-10-CM | POA: Diagnosis not present

## 2013-06-12 DIAGNOSIS — J45909 Unspecified asthma, uncomplicated: Secondary | ICD-10-CM | POA: Diagnosis not present

## 2013-06-12 DIAGNOSIS — M4800 Spinal stenosis, site unspecified: Secondary | ICD-10-CM | POA: Diagnosis not present

## 2013-06-12 DIAGNOSIS — J449 Chronic obstructive pulmonary disease, unspecified: Secondary | ICD-10-CM | POA: Diagnosis not present

## 2013-06-12 DIAGNOSIS — E119 Type 2 diabetes mellitus without complications: Secondary | ICD-10-CM | POA: Diagnosis not present

## 2013-06-12 DIAGNOSIS — Z471 Aftercare following joint replacement surgery: Secondary | ICD-10-CM | POA: Diagnosis not present

## 2013-06-30 DIAGNOSIS — I10 Essential (primary) hypertension: Secondary | ICD-10-CM | POA: Diagnosis present

## 2013-06-30 DIAGNOSIS — E1142 Type 2 diabetes mellitus with diabetic polyneuropathy: Secondary | ICD-10-CM | POA: Diagnosis present

## 2013-06-30 DIAGNOSIS — Z96659 Presence of unspecified artificial knee joint: Secondary | ICD-10-CM | POA: Diagnosis not present

## 2013-06-30 DIAGNOSIS — Z8614 Personal history of Methicillin resistant Staphylococcus aureus infection: Secondary | ICD-10-CM | POA: Diagnosis not present

## 2013-06-30 DIAGNOSIS — E1149 Type 2 diabetes mellitus with other diabetic neurological complication: Secondary | ICD-10-CM | POA: Diagnosis present

## 2013-06-30 DIAGNOSIS — M810 Age-related osteoporosis without current pathological fracture: Secondary | ICD-10-CM | POA: Diagnosis present

## 2013-06-30 DIAGNOSIS — Z794 Long term (current) use of insulin: Secondary | ICD-10-CM | POA: Diagnosis not present

## 2013-06-30 DIAGNOSIS — M19079 Primary osteoarthritis, unspecified ankle and foot: Secondary | ICD-10-CM | POA: Diagnosis not present

## 2013-06-30 DIAGNOSIS — Z8543 Personal history of malignant neoplasm of ovary: Secondary | ICD-10-CM | POA: Diagnosis not present

## 2013-06-30 DIAGNOSIS — D649 Anemia, unspecified: Secondary | ICD-10-CM | POA: Diagnosis not present

## 2013-06-30 DIAGNOSIS — K219 Gastro-esophageal reflux disease without esophagitis: Secondary | ICD-10-CM | POA: Diagnosis present

## 2013-06-30 DIAGNOSIS — J449 Chronic obstructive pulmonary disease, unspecified: Secondary | ICD-10-CM | POA: Diagnosis present

## 2013-06-30 DIAGNOSIS — IMO0002 Reserved for concepts with insufficient information to code with codable children: Secondary | ICD-10-CM | POA: Diagnosis not present

## 2013-06-30 DIAGNOSIS — R079 Chest pain, unspecified: Secondary | ICD-10-CM | POA: Diagnosis not present

## 2013-06-30 DIAGNOSIS — E785 Hyperlipidemia, unspecified: Secondary | ICD-10-CM | POA: Diagnosis present

## 2013-06-30 DIAGNOSIS — L02619 Cutaneous abscess of unspecified foot: Secondary | ICD-10-CM | POA: Diagnosis present

## 2013-06-30 DIAGNOSIS — IMO0001 Reserved for inherently not codable concepts without codable children: Secondary | ICD-10-CM | POA: Diagnosis present

## 2013-06-30 DIAGNOSIS — M353 Polymyalgia rheumatica: Secondary | ICD-10-CM | POA: Diagnosis present

## 2013-06-30 DIAGNOSIS — M4800 Spinal stenosis, site unspecified: Secondary | ICD-10-CM | POA: Diagnosis present

## 2013-06-30 DIAGNOSIS — Z880 Allergy status to penicillin: Secondary | ICD-10-CM | POA: Diagnosis not present

## 2013-06-30 DIAGNOSIS — M79609 Pain in unspecified limb: Secondary | ICD-10-CM | POA: Diagnosis not present

## 2013-06-30 DIAGNOSIS — R7309 Other abnormal glucose: Secondary | ICD-10-CM | POA: Diagnosis not present

## 2013-06-30 DIAGNOSIS — J984 Other disorders of lung: Secondary | ICD-10-CM | POA: Diagnosis not present

## 2013-06-30 DIAGNOSIS — D72829 Elevated white blood cell count, unspecified: Secondary | ICD-10-CM | POA: Diagnosis not present

## 2013-07-01 DIAGNOSIS — L02619 Cutaneous abscess of unspecified foot: Secondary | ICD-10-CM | POA: Diagnosis not present

## 2013-07-01 DIAGNOSIS — E1142 Type 2 diabetes mellitus with diabetic polyneuropathy: Secondary | ICD-10-CM | POA: Diagnosis not present

## 2013-07-01 DIAGNOSIS — Z96659 Presence of unspecified artificial knee joint: Secondary | ICD-10-CM | POA: Diagnosis not present

## 2013-07-01 DIAGNOSIS — E1149 Type 2 diabetes mellitus with other diabetic neurological complication: Secondary | ICD-10-CM | POA: Diagnosis not present

## 2013-07-01 DIAGNOSIS — M79609 Pain in unspecified limb: Secondary | ICD-10-CM | POA: Diagnosis not present

## 2013-07-01 DIAGNOSIS — E785 Hyperlipidemia, unspecified: Secondary | ICD-10-CM | POA: Diagnosis not present

## 2013-07-01 DIAGNOSIS — M19079 Primary osteoarthritis, unspecified ankle and foot: Secondary | ICD-10-CM | POA: Diagnosis not present

## 2013-07-02 DIAGNOSIS — I10 Essential (primary) hypertension: Secondary | ICD-10-CM | POA: Diagnosis not present

## 2013-07-02 DIAGNOSIS — IMO0001 Reserved for inherently not codable concepts without codable children: Secondary | ICD-10-CM | POA: Diagnosis not present

## 2013-07-02 DIAGNOSIS — L0291 Cutaneous abscess, unspecified: Secondary | ICD-10-CM | POA: Diagnosis not present

## 2013-07-02 DIAGNOSIS — L0390 Cellulitis, unspecified: Secondary | ICD-10-CM | POA: Diagnosis not present

## 2013-07-02 DIAGNOSIS — M79609 Pain in unspecified limb: Secondary | ICD-10-CM | POA: Diagnosis not present

## 2013-07-09 DIAGNOSIS — L0291 Cutaneous abscess, unspecified: Secondary | ICD-10-CM | POA: Diagnosis not present

## 2013-07-09 DIAGNOSIS — L0390 Cellulitis, unspecified: Secondary | ICD-10-CM | POA: Diagnosis not present

## 2013-07-09 DIAGNOSIS — M79609 Pain in unspecified limb: Secondary | ICD-10-CM | POA: Diagnosis not present

## 2013-07-09 DIAGNOSIS — I10 Essential (primary) hypertension: Secondary | ICD-10-CM | POA: Diagnosis not present

## 2013-07-16 DIAGNOSIS — IMO0001 Reserved for inherently not codable concepts without codable children: Secondary | ICD-10-CM | POA: Diagnosis not present

## 2013-07-16 DIAGNOSIS — M109 Gout, unspecified: Secondary | ICD-10-CM | POA: Diagnosis not present

## 2013-07-16 DIAGNOSIS — M899 Disorder of bone, unspecified: Secondary | ICD-10-CM | POA: Diagnosis not present

## 2013-07-17 DIAGNOSIS — N879 Dysplasia of cervix uteri, unspecified: Secondary | ICD-10-CM | POA: Diagnosis not present

## 2013-07-17 DIAGNOSIS — N760 Acute vaginitis: Secondary | ICD-10-CM | POA: Diagnosis not present

## 2013-07-17 DIAGNOSIS — F332 Major depressive disorder, recurrent severe without psychotic features: Secondary | ICD-10-CM | POA: Diagnosis not present

## 2013-07-17 DIAGNOSIS — C569 Malignant neoplasm of unspecified ovary: Secondary | ICD-10-CM | POA: Diagnosis not present

## 2013-07-17 DIAGNOSIS — R32 Unspecified urinary incontinence: Secondary | ICD-10-CM | POA: Diagnosis not present

## 2013-07-18 DIAGNOSIS — J479 Bronchiectasis, uncomplicated: Secondary | ICD-10-CM | POA: Diagnosis not present

## 2013-07-18 DIAGNOSIS — J45909 Unspecified asthma, uncomplicated: Secondary | ICD-10-CM | POA: Diagnosis not present

## 2013-07-26 DIAGNOSIS — IMO0001 Reserved for inherently not codable concepts without codable children: Secondary | ICD-10-CM | POA: Diagnosis not present

## 2013-07-26 DIAGNOSIS — E7800 Pure hypercholesterolemia, unspecified: Secondary | ICD-10-CM | POA: Diagnosis not present

## 2013-07-26 DIAGNOSIS — E039 Hypothyroidism, unspecified: Secondary | ICD-10-CM | POA: Diagnosis not present

## 2013-07-26 DIAGNOSIS — I10 Essential (primary) hypertension: Secondary | ICD-10-CM | POA: Diagnosis not present

## 2013-08-23 DIAGNOSIS — Z96659 Presence of unspecified artificial knee joint: Secondary | ICD-10-CM | POA: Diagnosis not present

## 2013-08-23 DIAGNOSIS — M1710 Unilateral primary osteoarthritis, unspecified knee: Secondary | ICD-10-CM | POA: Diagnosis not present

## 2013-08-28 DIAGNOSIS — M810 Age-related osteoporosis without current pathological fracture: Secondary | ICD-10-CM | POA: Diagnosis not present

## 2013-08-29 DIAGNOSIS — E782 Mixed hyperlipidemia: Secondary | ICD-10-CM | POA: Diagnosis not present

## 2013-08-29 DIAGNOSIS — E0590 Thyrotoxicosis, unspecified without thyrotoxic crisis or storm: Secondary | ICD-10-CM | POA: Diagnosis not present

## 2013-08-29 DIAGNOSIS — Z0000 Encounter for general adult medical examination without abnormal findings: Secondary | ICD-10-CM | POA: Diagnosis not present

## 2013-08-29 DIAGNOSIS — M109 Gout, unspecified: Secondary | ICD-10-CM | POA: Diagnosis not present

## 2013-08-29 DIAGNOSIS — J45901 Unspecified asthma with (acute) exacerbation: Secondary | ICD-10-CM | POA: Diagnosis not present

## 2013-08-29 DIAGNOSIS — R5381 Other malaise: Secondary | ICD-10-CM | POA: Diagnosis not present

## 2013-08-29 DIAGNOSIS — R5383 Other fatigue: Secondary | ICD-10-CM | POA: Diagnosis not present

## 2013-08-29 DIAGNOSIS — IMO0001 Reserved for inherently not codable concepts without codable children: Secondary | ICD-10-CM | POA: Diagnosis not present

## 2013-08-29 DIAGNOSIS — J309 Allergic rhinitis, unspecified: Secondary | ICD-10-CM | POA: Diagnosis not present

## 2013-08-29 DIAGNOSIS — I10 Essential (primary) hypertension: Secondary | ICD-10-CM | POA: Diagnosis not present

## 2013-08-29 DIAGNOSIS — E559 Vitamin D deficiency, unspecified: Secondary | ICD-10-CM | POA: Diagnosis not present

## 2013-08-29 DIAGNOSIS — F341 Dysthymic disorder: Secondary | ICD-10-CM | POA: Diagnosis not present

## 2013-08-30 DIAGNOSIS — M48061 Spinal stenosis, lumbar region without neurogenic claudication: Secondary | ICD-10-CM | POA: Diagnosis not present

## 2013-08-30 DIAGNOSIS — M1710 Unilateral primary osteoarthritis, unspecified knee: Secondary | ICD-10-CM | POA: Diagnosis not present

## 2013-08-30 DIAGNOSIS — R269 Unspecified abnormalities of gait and mobility: Secondary | ICD-10-CM | POA: Diagnosis not present

## 2013-08-30 DIAGNOSIS — IMO0002 Reserved for concepts with insufficient information to code with codable children: Secondary | ICD-10-CM | POA: Diagnosis not present

## 2013-09-03 DIAGNOSIS — M1710 Unilateral primary osteoarthritis, unspecified knee: Secondary | ICD-10-CM | POA: Diagnosis not present

## 2013-09-03 DIAGNOSIS — R269 Unspecified abnormalities of gait and mobility: Secondary | ICD-10-CM | POA: Diagnosis not present

## 2013-09-03 DIAGNOSIS — M48061 Spinal stenosis, lumbar region without neurogenic claudication: Secondary | ICD-10-CM | POA: Diagnosis not present

## 2013-09-03 DIAGNOSIS — IMO0002 Reserved for concepts with insufficient information to code with codable children: Secondary | ICD-10-CM | POA: Diagnosis not present

## 2013-09-04 DIAGNOSIS — M48061 Spinal stenosis, lumbar region without neurogenic claudication: Secondary | ICD-10-CM | POA: Diagnosis not present

## 2013-09-04 DIAGNOSIS — M4310 Spondylolisthesis, site unspecified: Secondary | ICD-10-CM | POA: Diagnosis not present

## 2013-09-06 DIAGNOSIS — M48061 Spinal stenosis, lumbar region without neurogenic claudication: Secondary | ICD-10-CM | POA: Diagnosis not present

## 2013-09-06 DIAGNOSIS — R269 Unspecified abnormalities of gait and mobility: Secondary | ICD-10-CM | POA: Diagnosis not present

## 2013-09-06 DIAGNOSIS — M1710 Unilateral primary osteoarthritis, unspecified knee: Secondary | ICD-10-CM | POA: Diagnosis not present

## 2013-09-06 DIAGNOSIS — IMO0002 Reserved for concepts with insufficient information to code with codable children: Secondary | ICD-10-CM | POA: Diagnosis not present

## 2013-09-11 DIAGNOSIS — R269 Unspecified abnormalities of gait and mobility: Secondary | ICD-10-CM | POA: Diagnosis not present

## 2013-09-11 DIAGNOSIS — IMO0002 Reserved for concepts with insufficient information to code with codable children: Secondary | ICD-10-CM | POA: Diagnosis not present

## 2013-09-11 DIAGNOSIS — M48061 Spinal stenosis, lumbar region without neurogenic claudication: Secondary | ICD-10-CM | POA: Diagnosis not present

## 2013-09-11 DIAGNOSIS — M1710 Unilateral primary osteoarthritis, unspecified knee: Secondary | ICD-10-CM | POA: Diagnosis not present

## 2013-09-13 DIAGNOSIS — R269 Unspecified abnormalities of gait and mobility: Secondary | ICD-10-CM | POA: Diagnosis not present

## 2013-09-13 DIAGNOSIS — IMO0002 Reserved for concepts with insufficient information to code with codable children: Secondary | ICD-10-CM | POA: Diagnosis not present

## 2013-09-13 DIAGNOSIS — M1710 Unilateral primary osteoarthritis, unspecified knee: Secondary | ICD-10-CM | POA: Diagnosis not present

## 2013-09-13 DIAGNOSIS — M48061 Spinal stenosis, lumbar region without neurogenic claudication: Secondary | ICD-10-CM | POA: Diagnosis not present

## 2013-09-17 DIAGNOSIS — M48061 Spinal stenosis, lumbar region without neurogenic claudication: Secondary | ICD-10-CM | POA: Diagnosis not present

## 2013-09-17 DIAGNOSIS — M1710 Unilateral primary osteoarthritis, unspecified knee: Secondary | ICD-10-CM | POA: Diagnosis not present

## 2013-09-17 DIAGNOSIS — IMO0002 Reserved for concepts with insufficient information to code with codable children: Secondary | ICD-10-CM | POA: Diagnosis not present

## 2013-09-17 DIAGNOSIS — R269 Unspecified abnormalities of gait and mobility: Secondary | ICD-10-CM | POA: Diagnosis not present

## 2013-09-18 DIAGNOSIS — Z8543 Personal history of malignant neoplasm of ovary: Secondary | ICD-10-CM | POA: Diagnosis not present

## 2013-09-18 DIAGNOSIS — R922 Inconclusive mammogram: Secondary | ICD-10-CM | POA: Diagnosis not present

## 2013-09-18 DIAGNOSIS — R920 Mammographic microcalcification found on diagnostic imaging of breast: Secondary | ICD-10-CM | POA: Diagnosis not present

## 2013-09-20 DIAGNOSIS — R269 Unspecified abnormalities of gait and mobility: Secondary | ICD-10-CM | POA: Diagnosis not present

## 2013-09-20 DIAGNOSIS — M1710 Unilateral primary osteoarthritis, unspecified knee: Secondary | ICD-10-CM | POA: Diagnosis not present

## 2013-09-20 DIAGNOSIS — IMO0002 Reserved for concepts with insufficient information to code with codable children: Secondary | ICD-10-CM | POA: Diagnosis not present

## 2013-09-20 DIAGNOSIS — M48061 Spinal stenosis, lumbar region without neurogenic claudication: Secondary | ICD-10-CM | POA: Diagnosis not present

## 2013-09-24 DIAGNOSIS — M48061 Spinal stenosis, lumbar region without neurogenic claudication: Secondary | ICD-10-CM | POA: Diagnosis not present

## 2013-09-24 DIAGNOSIS — IMO0002 Reserved for concepts with insufficient information to code with codable children: Secondary | ICD-10-CM | POA: Diagnosis not present

## 2013-09-24 DIAGNOSIS — R269 Unspecified abnormalities of gait and mobility: Secondary | ICD-10-CM | POA: Diagnosis not present

## 2013-09-24 DIAGNOSIS — M1710 Unilateral primary osteoarthritis, unspecified knee: Secondary | ICD-10-CM | POA: Diagnosis not present

## 2013-09-25 DIAGNOSIS — L738 Other specified follicular disorders: Secondary | ICD-10-CM | POA: Diagnosis not present

## 2013-09-25 DIAGNOSIS — L819 Disorder of pigmentation, unspecified: Secondary | ICD-10-CM | POA: Diagnosis not present

## 2013-09-25 DIAGNOSIS — L723 Sebaceous cyst: Secondary | ICD-10-CM | POA: Diagnosis not present

## 2013-09-25 DIAGNOSIS — L821 Other seborrheic keratosis: Secondary | ICD-10-CM | POA: Diagnosis not present

## 2013-09-25 DIAGNOSIS — D485 Neoplasm of uncertain behavior of skin: Secondary | ICD-10-CM | POA: Diagnosis not present

## 2013-09-27 DIAGNOSIS — M1710 Unilateral primary osteoarthritis, unspecified knee: Secondary | ICD-10-CM | POA: Diagnosis not present

## 2013-09-27 DIAGNOSIS — R269 Unspecified abnormalities of gait and mobility: Secondary | ICD-10-CM | POA: Diagnosis not present

## 2013-09-27 DIAGNOSIS — IMO0002 Reserved for concepts with insufficient information to code with codable children: Secondary | ICD-10-CM | POA: Diagnosis not present

## 2013-09-27 DIAGNOSIS — M48061 Spinal stenosis, lumbar region without neurogenic claudication: Secondary | ICD-10-CM | POA: Diagnosis not present

## 2013-10-01 DIAGNOSIS — R269 Unspecified abnormalities of gait and mobility: Secondary | ICD-10-CM | POA: Diagnosis not present

## 2013-10-01 DIAGNOSIS — IMO0002 Reserved for concepts with insufficient information to code with codable children: Secondary | ICD-10-CM | POA: Diagnosis not present

## 2013-10-01 DIAGNOSIS — M1710 Unilateral primary osteoarthritis, unspecified knee: Secondary | ICD-10-CM | POA: Diagnosis not present

## 2013-10-01 DIAGNOSIS — M48061 Spinal stenosis, lumbar region without neurogenic claudication: Secondary | ICD-10-CM | POA: Diagnosis not present

## 2013-10-08 DIAGNOSIS — M48061 Spinal stenosis, lumbar region without neurogenic claudication: Secondary | ICD-10-CM | POA: Diagnosis not present

## 2013-10-08 DIAGNOSIS — M1710 Unilateral primary osteoarthritis, unspecified knee: Secondary | ICD-10-CM | POA: Diagnosis not present

## 2013-10-08 DIAGNOSIS — R269 Unspecified abnormalities of gait and mobility: Secondary | ICD-10-CM | POA: Diagnosis not present

## 2013-10-08 DIAGNOSIS — IMO0002 Reserved for concepts with insufficient information to code with codable children: Secondary | ICD-10-CM | POA: Diagnosis not present

## 2013-11-13 DIAGNOSIS — N390 Urinary tract infection, site not specified: Secondary | ICD-10-CM | POA: Diagnosis not present

## 2013-11-13 DIAGNOSIS — C569 Malignant neoplasm of unspecified ovary: Secondary | ICD-10-CM | POA: Diagnosis not present

## 2013-11-13 DIAGNOSIS — R32 Unspecified urinary incontinence: Secondary | ICD-10-CM | POA: Diagnosis not present

## 2013-11-13 DIAGNOSIS — N760 Acute vaginitis: Secondary | ICD-10-CM | POA: Diagnosis not present

## 2013-11-16 DIAGNOSIS — N760 Acute vaginitis: Secondary | ICD-10-CM | POA: Diagnosis not present

## 2013-11-16 DIAGNOSIS — C569 Malignant neoplasm of unspecified ovary: Secondary | ICD-10-CM | POA: Diagnosis not present

## 2013-11-16 DIAGNOSIS — R32 Unspecified urinary incontinence: Secondary | ICD-10-CM | POA: Diagnosis not present

## 2013-11-22 DIAGNOSIS — IMO0001 Reserved for inherently not codable concepts without codable children: Secondary | ICD-10-CM | POA: Diagnosis not present

## 2013-11-22 DIAGNOSIS — Z79899 Other long term (current) drug therapy: Secondary | ICD-10-CM | POA: Diagnosis not present

## 2013-11-22 DIAGNOSIS — Z794 Long term (current) use of insulin: Secondary | ICD-10-CM | POA: Diagnosis not present

## 2013-11-22 DIAGNOSIS — E7800 Pure hypercholesterolemia, unspecified: Secondary | ICD-10-CM | POA: Diagnosis not present

## 2013-11-22 DIAGNOSIS — I10 Essential (primary) hypertension: Secondary | ICD-10-CM | POA: Diagnosis not present

## 2013-11-28 DIAGNOSIS — Z79899 Other long term (current) drug therapy: Secondary | ICD-10-CM | POA: Diagnosis not present

## 2013-11-28 DIAGNOSIS — IMO0001 Reserved for inherently not codable concepts without codable children: Secondary | ICD-10-CM | POA: Diagnosis not present

## 2013-11-28 DIAGNOSIS — E7800 Pure hypercholesterolemia, unspecified: Secondary | ICD-10-CM | POA: Diagnosis not present

## 2013-12-05 DIAGNOSIS — R262 Difficulty in walking, not elsewhere classified: Secondary | ICD-10-CM | POA: Diagnosis not present

## 2013-12-05 DIAGNOSIS — M1710 Unilateral primary osteoarthritis, unspecified knee: Secondary | ICD-10-CM | POA: Diagnosis not present

## 2013-12-05 DIAGNOSIS — IMO0002 Reserved for concepts with insufficient information to code with codable children: Secondary | ICD-10-CM | POA: Diagnosis not present

## 2013-12-05 DIAGNOSIS — M542 Cervicalgia: Secondary | ICD-10-CM | POA: Diagnosis not present

## 2013-12-06 DIAGNOSIS — Z794 Long term (current) use of insulin: Secondary | ICD-10-CM | POA: Diagnosis not present

## 2013-12-06 DIAGNOSIS — IMO0001 Reserved for inherently not codable concepts without codable children: Secondary | ICD-10-CM | POA: Diagnosis not present

## 2013-12-06 DIAGNOSIS — I10 Essential (primary) hypertension: Secondary | ICD-10-CM | POA: Diagnosis not present

## 2013-12-06 DIAGNOSIS — E7800 Pure hypercholesterolemia, unspecified: Secondary | ICD-10-CM | POA: Diagnosis not present

## 2013-12-10 DIAGNOSIS — M1710 Unilateral primary osteoarthritis, unspecified knee: Secondary | ICD-10-CM | POA: Diagnosis not present

## 2013-12-10 DIAGNOSIS — IMO0002 Reserved for concepts with insufficient information to code with codable children: Secondary | ICD-10-CM | POA: Diagnosis not present

## 2013-12-10 DIAGNOSIS — M542 Cervicalgia: Secondary | ICD-10-CM | POA: Diagnosis not present

## 2013-12-10 DIAGNOSIS — R262 Difficulty in walking, not elsewhere classified: Secondary | ICD-10-CM | POA: Diagnosis not present

## 2013-12-11 DIAGNOSIS — E119 Type 2 diabetes mellitus without complications: Secondary | ICD-10-CM | POA: Diagnosis not present

## 2013-12-11 DIAGNOSIS — H259 Unspecified age-related cataract: Secondary | ICD-10-CM | POA: Diagnosis not present

## 2013-12-20 DIAGNOSIS — M179 Osteoarthritis of knee, unspecified: Secondary | ICD-10-CM | POA: Diagnosis not present

## 2013-12-20 DIAGNOSIS — M542 Cervicalgia: Secondary | ICD-10-CM | POA: Diagnosis not present

## 2013-12-20 DIAGNOSIS — R262 Difficulty in walking, not elsewhere classified: Secondary | ICD-10-CM | POA: Diagnosis not present

## 2013-12-21 DIAGNOSIS — I340 Nonrheumatic mitral (valve) insufficiency: Secondary | ICD-10-CM | POA: Diagnosis not present

## 2013-12-21 DIAGNOSIS — Z23 Encounter for immunization: Secondary | ICD-10-CM | POA: Diagnosis not present

## 2013-12-24 DIAGNOSIS — M542 Cervicalgia: Secondary | ICD-10-CM | POA: Diagnosis not present

## 2013-12-24 DIAGNOSIS — R262 Difficulty in walking, not elsewhere classified: Secondary | ICD-10-CM | POA: Diagnosis not present

## 2013-12-24 DIAGNOSIS — M179 Osteoarthritis of knee, unspecified: Secondary | ICD-10-CM | POA: Diagnosis not present

## 2013-12-26 DIAGNOSIS — N3941 Urge incontinence: Secondary | ICD-10-CM | POA: Diagnosis not present

## 2013-12-26 DIAGNOSIS — C675 Malignant neoplasm of bladder neck: Secondary | ICD-10-CM | POA: Diagnosis not present

## 2013-12-26 DIAGNOSIS — R351 Nocturia: Secondary | ICD-10-CM | POA: Diagnosis not present

## 2013-12-27 DIAGNOSIS — M542 Cervicalgia: Secondary | ICD-10-CM | POA: Diagnosis not present

## 2013-12-27 DIAGNOSIS — M179 Osteoarthritis of knee, unspecified: Secondary | ICD-10-CM | POA: Diagnosis not present

## 2013-12-27 DIAGNOSIS — R262 Difficulty in walking, not elsewhere classified: Secondary | ICD-10-CM | POA: Diagnosis not present

## 2013-12-28 DIAGNOSIS — M1712 Unilateral primary osteoarthritis, left knee: Secondary | ICD-10-CM | POA: Diagnosis not present

## 2013-12-28 DIAGNOSIS — Z96651 Presence of right artificial knee joint: Secondary | ICD-10-CM | POA: Diagnosis not present

## 2013-12-31 DIAGNOSIS — M62830 Muscle spasm of back: Secondary | ICD-10-CM | POA: Diagnosis not present

## 2013-12-31 DIAGNOSIS — M353 Polymyalgia rheumatica: Secondary | ICD-10-CM | POA: Diagnosis not present

## 2013-12-31 DIAGNOSIS — M810 Age-related osteoporosis without current pathological fracture: Secondary | ICD-10-CM | POA: Diagnosis not present

## 2013-12-31 DIAGNOSIS — M542 Cervicalgia: Secondary | ICD-10-CM | POA: Diagnosis not present

## 2013-12-31 DIAGNOSIS — M859 Disorder of bone density and structure, unspecified: Secondary | ICD-10-CM | POA: Diagnosis not present

## 2013-12-31 DIAGNOSIS — M797 Fibromyalgia: Secondary | ICD-10-CM | POA: Diagnosis not present

## 2013-12-31 DIAGNOSIS — R7989 Other specified abnormal findings of blood chemistry: Secondary | ICD-10-CM | POA: Diagnosis not present

## 2014-01-08 DIAGNOSIS — R32 Unspecified urinary incontinence: Secondary | ICD-10-CM | POA: Diagnosis not present

## 2014-01-08 DIAGNOSIS — N3941 Urge incontinence: Secondary | ICD-10-CM | POA: Diagnosis not present

## 2014-01-08 DIAGNOSIS — R351 Nocturia: Secondary | ICD-10-CM | POA: Diagnosis not present

## 2014-01-09 DIAGNOSIS — M542 Cervicalgia: Secondary | ICD-10-CM | POA: Diagnosis not present

## 2014-01-09 DIAGNOSIS — M179 Osteoarthritis of knee, unspecified: Secondary | ICD-10-CM | POA: Diagnosis not present

## 2014-01-09 DIAGNOSIS — R262 Difficulty in walking, not elsewhere classified: Secondary | ICD-10-CM | POA: Diagnosis not present

## 2014-01-09 DIAGNOSIS — Z23 Encounter for immunization: Secondary | ICD-10-CM | POA: Diagnosis not present

## 2014-01-14 DIAGNOSIS — J479 Bronchiectasis, uncomplicated: Secondary | ICD-10-CM | POA: Diagnosis not present

## 2014-01-15 DIAGNOSIS — M4802 Spinal stenosis, cervical region: Secondary | ICD-10-CM | POA: Diagnosis not present

## 2014-01-15 DIAGNOSIS — M4806 Spinal stenosis, lumbar region: Secondary | ICD-10-CM | POA: Diagnosis not present

## 2014-01-15 DIAGNOSIS — M4316 Spondylolisthesis, lumbar region: Secondary | ICD-10-CM | POA: Diagnosis not present

## 2014-01-15 DIAGNOSIS — M5416 Radiculopathy, lumbar region: Secondary | ICD-10-CM | POA: Diagnosis not present

## 2014-04-01 DIAGNOSIS — N390 Urinary tract infection, site not specified: Secondary | ICD-10-CM | POA: Diagnosis not present

## 2014-04-01 DIAGNOSIS — R351 Nocturia: Secondary | ICD-10-CM | POA: Diagnosis not present

## 2014-04-01 DIAGNOSIS — N3941 Urge incontinence: Secondary | ICD-10-CM | POA: Diagnosis not present

## 2014-04-02 DIAGNOSIS — N390 Urinary tract infection, site not specified: Secondary | ICD-10-CM | POA: Diagnosis not present

## 2014-04-03 DIAGNOSIS — R350 Frequency of micturition: Secondary | ICD-10-CM | POA: Diagnosis not present

## 2014-04-03 DIAGNOSIS — N3941 Urge incontinence: Secondary | ICD-10-CM | POA: Diagnosis not present

## 2014-04-08 DIAGNOSIS — R351 Nocturia: Secondary | ICD-10-CM | POA: Diagnosis not present

## 2014-04-08 DIAGNOSIS — R312 Other microscopic hematuria: Secondary | ICD-10-CM | POA: Diagnosis not present

## 2014-04-08 DIAGNOSIS — N3941 Urge incontinence: Secondary | ICD-10-CM | POA: Diagnosis not present

## 2014-04-09 DIAGNOSIS — N319 Neuromuscular dysfunction of bladder, unspecified: Secondary | ICD-10-CM | POA: Diagnosis not present

## 2014-04-10 DIAGNOSIS — R350 Frequency of micturition: Secondary | ICD-10-CM | POA: Diagnosis not present

## 2014-04-15 DIAGNOSIS — E039 Hypothyroidism, unspecified: Secondary | ICD-10-CM | POA: Diagnosis not present

## 2014-04-15 DIAGNOSIS — R109 Unspecified abdominal pain: Secondary | ICD-10-CM | POA: Diagnosis not present

## 2014-04-15 DIAGNOSIS — E119 Type 2 diabetes mellitus without complications: Secondary | ICD-10-CM | POA: Diagnosis not present

## 2014-04-15 DIAGNOSIS — E780 Pure hypercholesterolemia: Secondary | ICD-10-CM | POA: Diagnosis not present

## 2014-04-19 DIAGNOSIS — Z794 Long term (current) use of insulin: Secondary | ICD-10-CM | POA: Diagnosis not present

## 2014-04-19 DIAGNOSIS — E039 Hypothyroidism, unspecified: Secondary | ICD-10-CM | POA: Diagnosis not present

## 2014-04-19 DIAGNOSIS — E1065 Type 1 diabetes mellitus with hyperglycemia: Secondary | ICD-10-CM | POA: Diagnosis not present

## 2014-04-19 DIAGNOSIS — E780 Pure hypercholesterolemia: Secondary | ICD-10-CM | POA: Diagnosis not present

## 2014-05-08 DIAGNOSIS — M7051 Other bursitis of knee, right knee: Secondary | ICD-10-CM | POA: Diagnosis not present

## 2014-05-08 DIAGNOSIS — Z96651 Presence of right artificial knee joint: Secondary | ICD-10-CM | POA: Diagnosis not present

## 2014-05-08 DIAGNOSIS — M1712 Unilateral primary osteoarthritis, left knee: Secondary | ICD-10-CM | POA: Diagnosis not present

## 2014-05-20 DIAGNOSIS — M791 Myalgia: Secondary | ICD-10-CM | POA: Diagnosis not present

## 2014-05-20 DIAGNOSIS — M4806 Spinal stenosis, lumbar region: Secondary | ICD-10-CM | POA: Diagnosis not present

## 2014-06-03 DIAGNOSIS — H25813 Combined forms of age-related cataract, bilateral: Secondary | ICD-10-CM | POA: Diagnosis not present

## 2014-06-03 DIAGNOSIS — E119 Type 2 diabetes mellitus without complications: Secondary | ICD-10-CM | POA: Diagnosis not present

## 2014-06-03 DIAGNOSIS — H1851 Endothelial corneal dystrophy: Secondary | ICD-10-CM | POA: Diagnosis not present

## 2014-06-03 DIAGNOSIS — H04123 Dry eye syndrome of bilateral lacrimal glands: Secondary | ICD-10-CM | POA: Diagnosis not present

## 2014-06-06 DIAGNOSIS — M797 Fibromyalgia: Secondary | ICD-10-CM | POA: Diagnosis not present

## 2014-06-06 DIAGNOSIS — R7989 Other specified abnormal findings of blood chemistry: Secondary | ICD-10-CM | POA: Diagnosis not present

## 2014-06-06 DIAGNOSIS — M353 Polymyalgia rheumatica: Secondary | ICD-10-CM | POA: Diagnosis not present

## 2014-06-06 DIAGNOSIS — M810 Age-related osteoporosis without current pathological fracture: Secondary | ICD-10-CM | POA: Diagnosis not present

## 2014-06-06 DIAGNOSIS — M859 Disorder of bone density and structure, unspecified: Secondary | ICD-10-CM | POA: Diagnosis not present

## 2014-06-13 DIAGNOSIS — R350 Frequency of micturition: Secondary | ICD-10-CM | POA: Diagnosis not present

## 2014-06-13 DIAGNOSIS — R3914 Feeling of incomplete bladder emptying: Secondary | ICD-10-CM | POA: Diagnosis not present

## 2014-06-13 DIAGNOSIS — N3941 Urge incontinence: Secondary | ICD-10-CM | POA: Diagnosis not present

## 2014-06-25 DIAGNOSIS — M4806 Spinal stenosis, lumbar region: Secondary | ICD-10-CM | POA: Diagnosis not present

## 2014-07-10 ENCOUNTER — Other Ambulatory Visit: Payer: Self-pay | Admitting: Physical Medicine and Rehabilitation

## 2014-07-10 DIAGNOSIS — Z780 Asymptomatic menopausal state: Secondary | ICD-10-CM

## 2014-07-17 ENCOUNTER — Other Ambulatory Visit: Payer: Self-pay | Admitting: Physical Medicine and Rehabilitation

## 2014-07-17 DIAGNOSIS — E2839 Other primary ovarian failure: Secondary | ICD-10-CM

## 2014-07-25 ENCOUNTER — Ambulatory Visit
Admission: RE | Admit: 2014-07-25 | Discharge: 2014-07-25 | Disposition: A | Discharge status: Home / Self Care | Payer: Medicare Other | Source: Ambulatory Visit | Attending: Physical Medicine and Rehabilitation | Admitting: Physical Medicine and Rehabilitation

## 2014-07-25 DIAGNOSIS — E2839 Other primary ovarian failure: Secondary | ICD-10-CM

## 2014-07-25 DIAGNOSIS — M810 Age-related osteoporosis without current pathological fracture: Secondary | ICD-10-CM | POA: Diagnosis not present

## 2014-07-29 DIAGNOSIS — M810 Age-related osteoporosis without current pathological fracture: Secondary | ICD-10-CM | POA: Diagnosis not present

## 2014-08-02 ENCOUNTER — Emergency Department (HOSPITAL_COMMUNITY): Payer: Medicare Other

## 2014-08-02 ENCOUNTER — Observation Stay (HOSPITAL_COMMUNITY)
Admission: EM | Admit: 2014-08-02 | Discharge: 2014-08-03 | Disposition: A | Discharge status: Home / Self Care | Payer: Medicare Other | Attending: Internal Medicine | Admitting: Internal Medicine

## 2014-08-02 ENCOUNTER — Inpatient Hospital Stay (HOSPITAL_COMMUNITY): Payer: Medicare Other

## 2014-08-02 ENCOUNTER — Observation Stay (HOSPITAL_COMMUNITY): Payer: Medicare Other

## 2014-08-02 ENCOUNTER — Observation Stay (HOSPITAL_BASED_OUTPATIENT_CLINIC_OR_DEPARTMENT_OTHER): Payer: Medicare Other

## 2014-08-02 ENCOUNTER — Encounter (HOSPITAL_COMMUNITY): Payer: Self-pay | Admitting: Emergency Medicine

## 2014-08-02 ENCOUNTER — Observation Stay (HOSPITAL_BASED_OUTPATIENT_CLINIC_OR_DEPARTMENT_OTHER)
Admit: 2014-08-02 | Discharge: 2014-08-02 | Disposition: A | Discharge status: Home / Self Care | Payer: Medicare Other | Attending: Internal Medicine | Admitting: Internal Medicine

## 2014-08-02 DIAGNOSIS — R55 Syncope and collapse: Principal | ICD-10-CM | POA: Diagnosis present

## 2014-08-02 DIAGNOSIS — R52 Pain, unspecified: Secondary | ICD-10-CM | POA: Diagnosis not present

## 2014-08-02 DIAGNOSIS — S52612A Displaced fracture of left ulna styloid process, initial encounter for closed fracture: Secondary | ICD-10-CM | POA: Diagnosis not present

## 2014-08-02 DIAGNOSIS — T148 Other injury of unspecified body region: Secondary | ICD-10-CM | POA: Diagnosis not present

## 2014-08-02 DIAGNOSIS — I10 Essential (primary) hypertension: Secondary | ICD-10-CM

## 2014-08-02 DIAGNOSIS — Z91040 Latex allergy status: Secondary | ICD-10-CM | POA: Diagnosis not present

## 2014-08-02 DIAGNOSIS — Z794 Long term (current) use of insulin: Secondary | ICD-10-CM | POA: Diagnosis not present

## 2014-08-02 DIAGNOSIS — S299XXA Unspecified injury of thorax, initial encounter: Secondary | ICD-10-CM | POA: Diagnosis not present

## 2014-08-02 DIAGNOSIS — S022XXA Fracture of nasal bones, initial encounter for closed fracture: Secondary | ICD-10-CM

## 2014-08-02 DIAGNOSIS — E119 Type 2 diabetes mellitus without complications: Secondary | ICD-10-CM | POA: Diagnosis not present

## 2014-08-02 DIAGNOSIS — S0990XA Unspecified injury of head, initial encounter: Secondary | ICD-10-CM | POA: Diagnosis not present

## 2014-08-02 DIAGNOSIS — S62102A Fracture of unspecified carpal bone, left wrist, initial encounter for closed fracture: Secondary | ICD-10-CM | POA: Diagnosis present

## 2014-08-02 DIAGNOSIS — S52572A Other intraarticular fracture of lower end of left radius, initial encounter for closed fracture: Secondary | ICD-10-CM | POA: Diagnosis not present

## 2014-08-02 DIAGNOSIS — S62102S Fracture of unspecified carpal bone, left wrist, sequela: Secondary | ICD-10-CM

## 2014-08-02 DIAGNOSIS — Z90710 Acquired absence of both cervix and uterus: Secondary | ICD-10-CM | POA: Insufficient documentation

## 2014-08-02 DIAGNOSIS — E785 Hyperlipidemia, unspecified: Secondary | ICD-10-CM | POA: Diagnosis present

## 2014-08-02 DIAGNOSIS — W1839XA Other fall on same level, initial encounter: Secondary | ICD-10-CM | POA: Diagnosis not present

## 2014-08-02 DIAGNOSIS — E039 Hypothyroidism, unspecified: Secondary | ICD-10-CM | POA: Diagnosis not present

## 2014-08-02 DIAGNOSIS — M797 Fibromyalgia: Secondary | ICD-10-CM | POA: Diagnosis not present

## 2014-08-02 DIAGNOSIS — S52602A Unspecified fracture of lower end of left ulna, initial encounter for closed fracture: Secondary | ICD-10-CM | POA: Diagnosis not present

## 2014-08-02 DIAGNOSIS — M25562 Pain in left knee: Secondary | ICD-10-CM | POA: Diagnosis not present

## 2014-08-02 DIAGNOSIS — W19XXXS Unspecified fall, sequela: Secondary | ICD-10-CM

## 2014-08-02 DIAGNOSIS — W19XXXA Unspecified fall, initial encounter: Secondary | ICD-10-CM | POA: Insufficient documentation

## 2014-08-02 DIAGNOSIS — S199XXA Unspecified injury of neck, initial encounter: Secondary | ICD-10-CM | POA: Diagnosis not present

## 2014-08-02 DIAGNOSIS — Z880 Allergy status to penicillin: Secondary | ICD-10-CM | POA: Insufficient documentation

## 2014-08-02 DIAGNOSIS — J45909 Unspecified asthma, uncomplicated: Secondary | ICD-10-CM | POA: Insufficient documentation

## 2014-08-02 DIAGNOSIS — M1712 Unilateral primary osteoarthritis, left knee: Secondary | ICD-10-CM | POA: Diagnosis not present

## 2014-08-02 DIAGNOSIS — S52502A Unspecified fracture of the lower end of left radius, initial encounter for closed fracture: Secondary | ICD-10-CM | POA: Diagnosis not present

## 2014-08-02 DIAGNOSIS — D72820 Lymphocytosis (symptomatic): Secondary | ICD-10-CM | POA: Diagnosis not present

## 2014-08-02 DIAGNOSIS — S8992XA Unspecified injury of left lower leg, initial encounter: Secondary | ICD-10-CM | POA: Diagnosis not present

## 2014-08-02 HISTORY — DX: Type 2 diabetes mellitus without complications: E11.9

## 2014-08-02 HISTORY — DX: Unspecified asthma, uncomplicated: J45.909

## 2014-08-02 HISTORY — DX: Malignant neoplasm of unspecified ovary: C56.9

## 2014-08-02 HISTORY — DX: Essential (primary) hypertension: I10

## 2014-08-02 HISTORY — DX: Fibromyalgia: M79.7

## 2014-08-02 LAB — COMPREHENSIVE METABOLIC PANEL
ALT: 14 U/L (ref 14–54)
AST: 15 U/L (ref 15–41)
Albumin: 3.2 g/dL — ABNORMAL LOW (ref 3.5–5.0)
Alkaline Phosphatase: 47 U/L (ref 38–126)
Anion gap: 10 (ref 5–15)
BUN: 16 mg/dL (ref 6–20)
CO2: 25 mmol/L (ref 22–32)
Calcium: 8 mg/dL — ABNORMAL LOW (ref 8.9–10.3)
Chloride: 103 mmol/L (ref 101–111)
Creatinine, Ser: 0.79 mg/dL (ref 0.44–1.00)
GFR calc Af Amer: >60 mL/min (ref >60)
GFR calc non Af Amer: >60 mL/min (ref >60)
Glucose, Bld: 130 mg/dL — ABNORMAL HIGH (ref 65–99)
Potassium: 3.6 mmol/L (ref 3.5–5.1)
Sodium: 138 mmol/L (ref 135–145)
Total Bilirubin: 0.6 mg/dL (ref 0.3–1.2)
Total Protein: 6.1 g/dL — ABNORMAL LOW (ref 6.5–8.1)

## 2014-08-02 LAB — CBC WITH DIFFERENTIAL/PLATELET
Basophils Absolute: 0 10*3/uL (ref 0.0–0.1)
Basophils Absolute: 0 10*3/uL (ref 0.0–0.1)
Basophils Relative: 0 % (ref 0–1)
Basophils Relative: 0 % (ref 0–1)
Eosinophils Absolute: 1.1 10*3/uL — ABNORMAL HIGH (ref 0.0–0.7)
Eosinophils Absolute: 0.9 10*3/uL — ABNORMAL HIGH (ref 0.0–0.7)
Eosinophils Relative: 7 % — ABNORMAL HIGH (ref 0–5)
Eosinophils Relative: 6 % — ABNORMAL HIGH (ref 0–5)
HCT: 37.5 % (ref 36.0–46.0)
HCT: 32.4 % — ABNORMAL LOW (ref 36.0–46.0)
Hemoglobin: 11.7 g/dL — ABNORMAL LOW (ref 12.0–15.0)
Hemoglobin: 10.1 g/dL — ABNORMAL LOW (ref 12.0–15.0)
Lymphocytes Relative: 35 % (ref 12–46)
Lymphocytes Relative: 43 % (ref 12–46)
Lymphs Abs: 5.6 10*3/uL — ABNORMAL HIGH (ref 0.7–4.0)
Lymphs Abs: 6.4 10*3/uL — ABNORMAL HIGH (ref 0.7–4.0)
MCH: 29 pg (ref 26.0–34.0)
MCH: 28.7 pg (ref 26.0–34.0)
MCHC: 31.2 g/dL (ref 30.0–36.0)
MCHC: 31.2 g/dL (ref 30.0–36.0)
MCV: 92.8 fL (ref 78.0–100.0)
MCV: 92 fL (ref 78.0–100.0)
Monocytes Absolute: 0.6 10*3/uL (ref 0.1–1.0)
Monocytes Absolute: 0.9 10*3/uL (ref 0.1–1.0)
Monocytes Relative: 4 % (ref 3–12)
Monocytes Relative: 6 % (ref 3–12)
Neutro Abs: 8.6 10*3/uL — ABNORMAL HIGH (ref 1.7–7.7)
Neutro Abs: 6.6 10*3/uL (ref 1.7–7.7)
Neutrophils Relative %: 54 % (ref 43–77)
Neutrophils Relative %: 45 % (ref 43–77)
Platelets: 297 10*3/uL (ref 150–400)
Platelets: 307 10*3/uL (ref 150–400)
RBC: 4.04 MIL/uL (ref 3.87–5.11)
RBC: 3.52 MIL/uL — ABNORMAL LOW (ref 3.87–5.11)
RDW: 15.6 % — ABNORMAL HIGH (ref 11.5–15.5)
RDW: 15.6 % — ABNORMAL HIGH (ref 11.5–15.5)
WBC: 15.9 10*3/uL — ABNORMAL HIGH (ref 4.0–10.5)
WBC: 14.8 10*3/uL — ABNORMAL HIGH (ref 4.0–10.5)

## 2014-08-02 LAB — I-STAT TROPONIN, ED: Troponin i, poc: 0.01 ng/mL (ref 0.00–0.08)

## 2014-08-02 LAB — GLUCOSE, CAPILLARY
Glucose-Capillary: 161 mg/dL — ABNORMAL HIGH (ref 65–99)
Glucose-Capillary: 148 mg/dL — ABNORMAL HIGH (ref 65–99)

## 2014-08-02 LAB — URINE MICROSCOPIC-ADD ON

## 2014-08-02 LAB — URINALYSIS, ROUTINE W REFLEX MICROSCOPIC
Bilirubin Urine: NEGATIVE
Glucose, UA: 250 mg/dL — AB
Hgb urine dipstick: NEGATIVE
Ketones, ur: NEGATIVE mg/dL
Nitrite: NEGATIVE
Protein, ur: NEGATIVE mg/dL
Specific Gravity, Urine: 1.028 (ref 1.005–1.030)
Urobilinogen, UA: 0.2 mg/dL (ref 0.0–1.0)
pH: 5.5 (ref 5.0–8.0)

## 2014-08-02 LAB — I-STAT CHEM 8, ED
BUN: 22 mg/dL — ABNORMAL HIGH (ref 6–20)
Calcium, Ion: 1.08 mmol/L — ABNORMAL LOW (ref 1.13–1.30)
Chloride: 103 mmol/L (ref 101–111)
Creatinine, Ser: 0.9 mg/dL (ref 0.44–1.00)
Glucose, Bld: 201 mg/dL — ABNORMAL HIGH (ref 65–99)
HCT: 39 % (ref 36.0–46.0)
Hemoglobin: 13.3 g/dL (ref 12.0–15.0)
Potassium: 4.3 mmol/L (ref 3.5–5.1)
Sodium: 138 mmol/L (ref 135–145)
TCO2: 23 mmol/L (ref 0–100)

## 2014-08-02 LAB — TSH: TSH: 0.591 u[IU]/mL (ref 0.350–4.500)

## 2014-08-02 LAB — PATHOLOGIST SMEAR REVIEW

## 2014-08-02 MED ORDER — VITAMIN B-12 1000 MCG PO TABS
1000.0000 ug | ORAL_TABLET | Freq: Every day | ORAL | Status: DC
  Administered 2014-08-02 – 2014-08-03 (×2): 1000 ug via ORAL
  Filled 2014-08-02 (×2): qty 1

## 2014-08-02 MED ORDER — PREDNISONE 1 MG PO TABS
3.0000 mg | ORAL_TABLET | Freq: Every day | ORAL | Status: DC
  Administered 2014-08-02 – 2014-08-03 (×2): 3 mg via ORAL
  Filled 2014-08-02 (×3): qty 3

## 2014-08-02 MED ORDER — ALBUTEROL SULFATE (2.5 MG/3ML) 0.083% IN NEBU
2.5000 mg | INHALATION_SOLUTION | Freq: Every day | RESPIRATORY_TRACT | Status: DC
  Administered 2014-08-02 – 2014-08-03 (×2): 2.5 mg via RESPIRATORY_TRACT
  Filled 2014-08-02 (×2): qty 3

## 2014-08-02 MED ORDER — DEXTROSE 5 % IV SOLN: 1.0000 g | Freq: Once | INTRAVENOUS | Status: DC

## 2014-08-02 MED ORDER — ONDANSETRON HCL 4 MG PO TABS: 4.0000 mg | ORAL_TABLET | Freq: Four times a day (QID) | ORAL | Status: DC | PRN

## 2014-08-02 MED ORDER — DOXYCYCLINE HYCLATE 100 MG PO TABS
100.0000 mg | ORAL_TABLET | Freq: Two times a day (BID) | ORAL | Status: DC
  Administered 2014-08-02 – 2014-08-03 (×3): 100 mg via ORAL
  Filled 2014-08-02 (×3): qty 1

## 2014-08-02 MED ORDER — VITAMIN D 1000 UNITS PO TABS
2000.0000 [IU] | ORAL_TABLET | Freq: Two times a day (BID) | ORAL | Status: DC
  Administered 2014-08-02 – 2014-08-03 (×3): 2000 [IU] via ORAL
  Filled 2014-08-02 (×3): qty 2

## 2014-08-02 MED ORDER — FENTANYL CITRATE (PF) 100 MCG/2ML IJ SOLN
50.0000 ug | Freq: Once | INTRAMUSCULAR | Status: AC
  Administered 2014-08-02: 50 ug via INTRAVENOUS
  Filled 2014-08-02: qty 2

## 2014-08-02 MED ORDER — TIOTROPIUM BROMIDE MONOHYDRATE 18 MCG IN CAPS
18.0000 ug | ORAL_CAPSULE | Freq: Every day | RESPIRATORY_TRACT | Status: DC
  Administered 2014-08-02 – 2014-08-03 (×2): 18 ug via RESPIRATORY_TRACT
  Filled 2014-08-02: qty 5

## 2014-08-02 MED ORDER — FLUTICASONE PROPIONATE 50 MCG/ACT NA SUSP
1.0000 | Freq: Every day | NASAL | Status: DC | PRN
  Filled 2014-08-02: qty 16

## 2014-08-02 MED ORDER — MONTELUKAST SODIUM 10 MG PO TABS
10.0000 mg | ORAL_TABLET | Freq: Every day | ORAL | Status: DC
  Administered 2014-08-02 – 2014-08-03 (×2): 10 mg via ORAL
  Filled 2014-08-02 (×2): qty 1

## 2014-08-02 MED ORDER — OXYCODONE-ACETAMINOPHEN 5-325 MG PO TABS
1.0000 | ORAL_TABLET | Freq: Once | ORAL | Status: AC
  Administered 2014-08-02: 1 via ORAL
  Filled 2014-08-02: qty 1

## 2014-08-02 MED ORDER — ROSUVASTATIN CALCIUM 10 MG PO TABS
10.0000 mg | ORAL_TABLET | Freq: Every evening | ORAL | Status: DC
  Administered 2014-08-02: 10 mg via ORAL
  Filled 2014-08-02: qty 1

## 2014-08-02 MED ORDER — ONDANSETRON HCL 4 MG/2ML IJ SOLN: 4.0000 mg | Freq: Four times a day (QID) | INTRAMUSCULAR | Status: DC | PRN

## 2014-08-02 MED ORDER — ZOLPIDEM TARTRATE 5 MG PO TABS
5.0000 mg | ORAL_TABLET | Freq: Every day | ORAL | Status: DC
  Administered 2014-08-02: 5 mg via ORAL
  Filled 2014-08-02: qty 1

## 2014-08-02 MED ORDER — INSULIN GLARGINE 100 UNIT/ML ~~LOC~~ SOLN
30.0000 [IU] | Freq: Every morning | SUBCUTANEOUS | Status: DC
  Administered 2014-08-02 – 2014-08-03 (×2): 30 [IU] via SUBCUTANEOUS
  Filled 2014-08-02 (×2): qty 0.3

## 2014-08-02 MED ORDER — PANTOPRAZOLE SODIUM 40 MG PO TBEC
40.0000 mg | DELAYED_RELEASE_TABLET | Freq: Every day | ORAL | Status: DC
  Administered 2014-08-02 – 2014-08-03 (×2): 40 mg via ORAL
  Filled 2014-08-02 (×2): qty 1

## 2014-08-02 MED ORDER — FAMOTIDINE 20 MG PO TABS
20.0000 mg | ORAL_TABLET | Freq: Two times a day (BID) | ORAL | Status: DC
  Administered 2014-08-02 – 2014-08-03 (×3): 20 mg via ORAL
  Filled 2014-08-02 (×3): qty 1

## 2014-08-02 MED ORDER — OMEGA-3-ACID ETHYL ESTERS 1 G PO CAPS
1.0000 g | ORAL_CAPSULE | Freq: Every day | ORAL | Status: DC
  Administered 2014-08-02 – 2014-08-03 (×2): 1 g via ORAL
  Filled 2014-08-02 (×2): qty 1

## 2014-08-02 MED ORDER — LEVOTHYROXINE SODIUM 25 MCG PO TABS
50.0000 ug | ORAL_TABLET | Freq: Every day | ORAL | Status: DC
  Administered 2014-08-02 – 2014-08-03 (×2): 50 ug via ORAL
  Filled 2014-08-02 (×2): qty 2

## 2014-08-02 MED ORDER — OXYCODONE-ACETAMINOPHEN 5-325 MG PO TABS
1.0000 | ORAL_TABLET | Freq: Four times a day (QID) | ORAL | Status: DC | PRN
  Administered 2014-08-02 – 2014-08-03 (×3): 2 via ORAL
  Filled 2014-08-02: qty 2
  Filled 2014-08-02 (×2): qty 1
  Filled 2014-08-02: qty 2

## 2014-08-02 MED ORDER — SODIUM CHLORIDE 0.9 % IV SOLN
INTRAVENOUS | Status: AC
  Administered 2014-08-02 – 2014-08-03 (×2): via INTRAVENOUS

## 2014-08-02 MED ORDER — CARVEDILOL 6.25 MG PO TABS
6.2500 mg | ORAL_TABLET | Freq: Two times a day (BID) | ORAL | Status: DC
  Administered 2014-08-02 – 2014-08-03 (×3): 6.25 mg via ORAL
  Filled 2014-08-02 (×3): qty 1

## 2014-08-02 MED ORDER — MOMETASONE FURO-FORMOTEROL FUM 200-5 MCG/ACT IN AERO
2.0000 | INHALATION_SPRAY | Freq: Two times a day (BID) | RESPIRATORY_TRACT | Status: DC
  Administered 2014-08-02 – 2014-08-03 (×3): 2 via RESPIRATORY_TRACT
  Filled 2014-08-02: qty 8.8

## 2014-08-02 MED ORDER — TETANUS-DIPHTH-ACELL PERTUSSIS 5-2.5-18.5 LF-MCG/0.5 IM SUSP
0.5000 mL | Freq: Once | INTRAMUSCULAR | Status: AC
  Administered 2014-08-02: 0.5 mL via INTRAMUSCULAR
  Filled 2014-08-02: qty 0.5

## 2014-08-02 MED ORDER — KETOROLAC TROMETHAMINE 30 MG/ML IJ SOLN
30.0000 mg | Freq: Once | INTRAMUSCULAR | Status: AC
  Administered 2014-08-02: 30 mg via INTRAVENOUS
  Filled 2014-08-02: qty 1

## 2014-08-02 MED ORDER — DULOXETINE HCL 60 MG PO CPEP
60.0000 mg | ORAL_CAPSULE | Freq: Every day | ORAL | Status: DC
  Administered 2014-08-02 – 2014-08-03 (×2): 60 mg via ORAL
  Filled 2014-08-02 (×2): qty 1

## 2014-08-02 MED ORDER — ACETAMINOPHEN 650 MG RE SUPP: 650.0000 mg | Freq: Four times a day (QID) | RECTAL | Status: DC | PRN

## 2014-08-02 MED ORDER — ACETAMINOPHEN 325 MG PO TABS
650.0000 mg | ORAL_TABLET | Freq: Four times a day (QID) | ORAL | Status: DC | PRN
Start: 2014-08-02 — End: 2014-08-03
  Administered 2014-08-02: 650 mg via ORAL
  Filled 2014-08-02 (×2): qty 2

## 2014-08-02 NOTE — Progress Notes (Signed)
MD was paged to have POC discussion. Patient arrived from [facility] ED into room 1430.

## 2014-08-02 NOTE — Progress Notes (Signed)
EEG completed; results pending.    

## 2014-08-02 NOTE — ED Provider Notes (Signed)
CSN: [id number]     Arrival date & time 08/02/14  0043 History   First MD Initiated Contact with Patient 08/02/14 0109     Chief Complaint  Patient presents with  . Loss of Consciousness     (Consider location/radiation/quality/duration/timing/severity/associated sxs/prior Treatment) Patient is a 79 y.o. female presenting with syncope. The history is provided by the patient and a relative.  Loss of Consciousness Episode history:  Single Most recent episode:  Today Timing:  Constant Progression:  Resolved Chronicity:  New Context: not urination   Witnessed: no   Relieved by:  Nothing Worsened by:  Nothing tried Ineffective treatments:  None tried Associated symptoms: no chest pain, no confusion, no diaphoresis, no difficulty breathing, no palpitations, no recent fall, no seizures, no shortness of breath and no weakness   Risk factors: no seizures     History reviewed. No pertinent past medical history. History reviewed. No pertinent past surgical history. History reviewed. No pertinent family history. History  Substance Use Topics  . Smoking status: Not on file  . Smokeless tobacco: Not on file  . Alcohol Use: Not on file   OB History    No data available     Review of Systems  Constitutional: Negative for diaphoresis.  HENT: Negative for facial swelling.   Respiratory: Negative for shortness of breath.   Cardiovascular: Positive for syncope. Negative for chest pain and palpitations.  Neurological: Negative for seizures, weakness and numbness.  Psychiatric/Behavioral: Negative for confusion.  All other systems reviewed and are negative.     Allergies  Latex and Penicillins  Home Medications   Prior to Admission medications   Medication Sig Start Date End Date Taking? Authorizing Provider  albuterol (PROVENTIL) (2.5 MG/3ML) 0.083% nebulizer solution Take 2.5 mg by nebulization daily.   Yes Historical Provider, MD  Calcium Carbonate (CALTRATE 600 PO) Take 1  tablet by mouth daily.   Yes Historical Provider, MD  carvedilol (COREG) 6.25 MG tablet Take 6.25 mg by mouth 2 (two) times daily with a meal.   Yes Historical Provider, MD  cholecalciferol (VITAMIN D) 1000 UNITS tablet Take 2,000 Units by mouth 2 (two) times daily.    Yes Historical Provider, MD  denosumab (PROLIA) 60 MG/ML SOLN injection Inject 60 mg into the skin every 6 (six) months. Administer in upper arm, thigh, or abdomen   Yes Historical Provider, MD  Dextromethorphan-Guaifenesin (MUCINEX DM MAXIMUM STRENGTH) 60-1200 MG TB12 Take 1 tablet by mouth 2 (two) times daily as needed (for cough/phlegm).   Yes Historical Provider, MD  DULoxetine (CYMBALTA) 60 MG capsule Take 60 mg by mouth daily.   Yes Historical Provider, MD  fluticasone (FLONASE) 50 MCG/ACT nasal spray Place 1-2 sprays into both nostrils daily as needed for allergies or rhinitis.   Yes Historical Provider, MD  Fluticasone-Salmeterol (ADVAIR) 500-50 MCG/DOSE AEPB Inhale 1 puff into the lungs 2 (two) times daily.   Yes Historical Provider, MD  insulin aspart (NOVOLOG) 100 UNIT/ML injection Inject 4 Units into the skin 2 (two) times daily before a meal.   Yes Historical Provider, MD  insulin glargine (LANTUS) 100 UNIT/ML injection Inject 30 Units into the skin every morning.   Yes Historical Provider, MD  lansoprazole (PREVACID) 30 MG capsule Take 30 mg by mouth 2 (two) times daily.    Yes Historical Provider, MD  levothyroxine (SYNTHROID, LEVOTHROID) 50 MCG tablet Take 50 mcg by mouth daily before breakfast.   Yes Historical Provider, MD  montelukast (SINGULAIR) 10 MG tablet Take 10 mg  by mouth daily.    Yes Historical Provider, MD  Multiple Vitamin (MULTIVITAMIN WITH MINERALS) TABS tablet Take 1 tablet by mouth daily. Women 50+   Yes Historical Provider, MD  NON FORMULARY 1 Syringe by Epidural route every 3 (three) months. Epidural Steroid Injections for Lumbar Spinal Stenosis up to 4 times a year   Yes Historical Provider, MD    Omega-3 Fatty Acids (FISH OIL) 1000 MG CAPS Take 1,000 mg by mouth daily.   Yes Historical Provider, MD  phenazopyridine (PYRIDIUM) 200 MG tablet Take 200 mg by mouth 3 (three) times daily as needed for pain.   Yes Historical Provider, MD  predniSONE (DELTASONE) 1 MG tablet Take 3 mg by mouth daily with breakfast.   Yes Historical Provider, MD  ranitidine (ZANTAC) 150 MG tablet Take 150 mg by mouth 2 (two) times daily as needed for heartburn.   Yes Historical Provider, MD  rosuvastatin (CRESTOR) 10 MG tablet Take 10 mg by mouth every evening.    Yes Historical Provider, MD  tiotropium (SPIRIVA) 18 MCG inhalation capsule Place 18 mcg into inhaler and inhale daily.   Yes Historical Provider, MD  vitamin B-12 (CYANOCOBALAMIN) 1000 MCG tablet Take 1,000 mcg by mouth daily.   Yes Historical Provider, MD  zolpidem (AMBIEN) 10 MG tablet Take 10 mg by mouth at bedtime.   Yes Historical Provider, MD   BP 132/81 mmHg  Pulse 93  Temp(Src) 98 F (36.7 C) (Oral)  Resp 22  SpO2 95% Physical Exam  Constitutional: She is oriented to person, place, and time. She appears well-developed and well-nourished. No distress.  HENT:  Head: Normocephalic. Head is without raccoon's eyes and without Battle's sign.    Right Ear: No hemotympanum.  Left Ear: No hemotympanum.  Nose: No nasal septal hematoma.  Mouth/Throat: Oropharynx is clear and moist. No oropharyngeal exudate.  Eyes: Conjunctivae are normal. Pupils are equal, round, and reactive to light.  Neck: Normal range of motion. Neck supple. No tracheal deviation present.  Cardiovascular: Normal rate, regular rhythm and intact distal pulses.   Pulmonary/Chest: Effort normal and breath sounds normal. No respiratory distress. She has no wheezes. She has no rales.  Abdominal: Soft. Bowel sounds are normal. There is no tenderness. There is no rebound and no guarding.  Musculoskeletal: Normal range of motion. She exhibits no edema.  Neurological: She is alert  and oriented to person, place, and time. She has normal reflexes.  Skin: Skin is warm and dry.    ED Course  Procedures (including critical care time) Labs Review Labs Reviewed  CBC WITH DIFFERENTIAL/PLATELET - Abnormal; Notable for the following:    WBC 15.9 (*)    Hemoglobin 11.7 (*)    RDW 15.6 (*)    Eosinophils Relative 7 (*)    Neutro Abs 8.6 (*)    Lymphs Abs 5.6 (*)    Eosinophils Absolute 1.1 (*)    All other components within normal limits  I-STAT CHEM 8, ED - Abnormal; Notable for the following:    BUN 22 (*)    Glucose, Bld 201 (*)    Calcium, Ion 1.08 (*)    All other components within normal limits  URINALYSIS, ROUTINE W REFLEX MICROSCOPIC  URINALYSIS, ROUTINE W REFLEX MICROSCOPIC  I-STAT TROPOININ, ED    Imaging Review Dg Chest 2 View  08/02/2014   CLINICAL DATA:  [place] this morning.  EXAM: CHEST  2 VIEW  COMPARISON:  None.  FINDINGS: There is no pneumothorax. There is no effusion. Mediastinal  and hilar contours are normal. Heart size is normal.  There is diffuse interstitial coarsening which may be chronic. There is no focal airspace consolidation. No displaced fractures are evident.  IMPRESSION: Diffuse chronic appearing interstitial coarsening. No evidence of pneumothorax or displaced fracture.   Electronically Signed   By: Andreas Newport M.D.   On: 08/02/2014 02:18   Dg Forearm Left  08/02/2014   CLINICAL DATA:  [place] in her den this morning  EXAM: LEFT FOREARM - 2 VIEW  COMPARISON:  None.  FINDINGS: The fractures of the distal radius and ulna are again evident. Remainder of the forearm is intact. There is no radiopaque foreign body.  IMPRESSION: Fractures of the distal radius and ulna.   Electronically Signed   By: Andreas Newport M.D.   On: 08/02/2014 02:20   Dg Wrist Complete Left  08/02/2014   CLINICAL DATA:  [place] this morning  EXAM: LEFT WRIST - COMPLETE 3+ VIEW  COMPARISON:  None  FINDINGS: There is a comminuted mildly impacted intra-articular fracture  of the distal radius. There is a transverse minimally displaced ulnar styloid fracture.  There is chondrocalcinosis about the proximal carpal row and moderate degenerative changes.  IMPRESSION: Fractures of the distal radius and ulna.   Electronically Signed   By: Andreas Newport M.D.   On: 08/02/2014 02:19   Ct Head Wo Contrast  08/02/2014   CLINICAL DATA:  Syncopal episode.  Fell in [employer].  EXAM: CT HEAD WITHOUT CONTRAST  CT MAXILLOFACIAL WITHOUT CONTRAST  CT CERVICAL SPINE WITHOUT CONTRAST  TECHNIQUE: Multidetector CT imaging of the head, cervical spine, and maxillofacial structures were performed using the standard protocol without intravenous contrast. Multiplanar CT image reconstructions of the cervical spine and maxillofacial structures were also generated.  COMPARISON:  None.  FINDINGS: CT HEAD FINDINGS  There is no intracranial hemorrhage, mass or evidence of acute infarction. There is moderate atrophy and periventricular hypodensity consistent with chronic small vessel disease. There is no extra-axial fluid collection. Brainstem and posterior fossa appear intact. No bony abnormalities are evident.  CT MAXILLOFACIAL FINDINGS  There are mildly depressed nasal bone fractures, worse on the right. Orbits are intact. Orbital floors are intact. Zygomatic arches and pterygoid plates are intact. Mandible is intact.  CT CERVICAL SPINE FINDINGS  The vertebral column, pedicles and facet articulations are intact. There is no evidence of acute fracture. No acute soft tissue abnormalities are evident.  Moderate degenerative disc changes are present, greatest at C5-6 and C6-7.  IMPRESSION: 1. Negative for acute intracranial traumatic injury. 2. Moderate generalized atrophy and chronic small vessel ischemic disease. No acute intracranial findings. 3. Mildly depressed nasal bone fractures. 4. Negative for acute cervical spine fracture.   Electronically Signed   By: Andreas Newport M.D.   On: 08/02/2014 02:26   Ct  Cervical Spine Wo Contrast  08/02/2014   CLINICAL DATA:  Syncopal episode.  Fell in [employer].  EXAM: CT HEAD WITHOUT CONTRAST  CT MAXILLOFACIAL WITHOUT CONTRAST  CT CERVICAL SPINE WITHOUT CONTRAST  TECHNIQUE: Multidetector CT imaging of the head, cervical spine, and maxillofacial structures were performed using the standard protocol without intravenous contrast. Multiplanar CT image reconstructions of the cervical spine and maxillofacial structures were also generated.  COMPARISON:  None.  FINDINGS: CT HEAD FINDINGS  There is no intracranial hemorrhage, mass or evidence of acute infarction. There is moderate atrophy and periventricular hypodensity consistent with chronic small vessel disease. There is no extra-axial fluid collection. Brainstem and posterior fossa appear intact. No bony  abnormalities are evident.  CT MAXILLOFACIAL FINDINGS  There are mildly depressed nasal bone fractures, worse on the right. Orbits are intact. Orbital floors are intact. Zygomatic arches and pterygoid plates are intact. Mandible is intact.  CT CERVICAL SPINE FINDINGS  The vertebral column, pedicles and facet articulations are intact. There is no evidence of acute fracture. No acute soft tissue abnormalities are evident.  Moderate degenerative disc changes are present, greatest at C5-6 and C6-7.  IMPRESSION: 1. Negative for acute intracranial traumatic injury. 2. Moderate generalized atrophy and chronic small vessel ischemic disease. No acute intracranial findings. 3. Mildly depressed nasal bone fractures. 4. Negative for acute cervical spine fracture.   Electronically Signed   By: Andreas Newport M.D.   On: 08/02/2014 02:26   Ct Maxillofacial Wo Cm  08/02/2014   CLINICAL DATA:  Syncopal episode.  Fell in [employer].  EXAM: CT HEAD WITHOUT CONTRAST  CT MAXILLOFACIAL WITHOUT CONTRAST  CT CERVICAL SPINE WITHOUT CONTRAST  TECHNIQUE: Multidetector CT imaging of the head, cervical spine, and maxillofacial structures were performed using  the standard protocol without intravenous contrast. Multiplanar CT image reconstructions of the cervical spine and maxillofacial structures were also generated.  COMPARISON:  None.  FINDINGS: CT HEAD FINDINGS  There is no intracranial hemorrhage, mass or evidence of acute infarction. There is moderate atrophy and periventricular hypodensity consistent with chronic small vessel disease. There is no extra-axial fluid collection. Brainstem and posterior fossa appear intact. No bony abnormalities are evident.  CT MAXILLOFACIAL FINDINGS  There are mildly depressed nasal bone fractures, worse on the right. Orbits are intact. Orbital floors are intact. Zygomatic arches and pterygoid plates are intact. Mandible is intact.  CT CERVICAL SPINE FINDINGS  The vertebral column, pedicles and facet articulations are intact. There is no evidence of acute fracture. No acute soft tissue abnormalities are evident.  Moderate degenerative disc changes are present, greatest at C5-6 and C6-7.  IMPRESSION: 1. Negative for acute intracranial traumatic injury. 2. Moderate generalized atrophy and chronic small vessel ischemic disease. No acute intracranial findings. 3. Mildly depressed nasal bone fractures. 4. Negative for acute cervical spine fracture.   Electronically Signed   By: Andreas Newport M.D.   On: 08/02/2014 02:26     EKG Interpretation   Date/Time:  Friday Aug 02 2014 01:06:27 EDT Ventricular Rate:  87 PR Interval:  143 QRS Duration: 95 QT Interval:  404 QTC Calculation: 486 R Axis:   66 Text Interpretation:  Sinus rhythm Confirmed by [facility]  MD, Shaneece Stockburger  ([id number]) on 08/02/2014 2:53:41 AM      MDM   Final diagnoses:  Fall    Syncope.  Will admit     Yousif Edelson, MD 08/02/14 [phone]

## 2014-08-02 NOTE — H&P (Signed)
[facility] History and Physical  Kayla Thompson BTD:[id number] DOB: 1936/03/03 DOA: 08/02/2014  Referring physician: Dr. Nicholes Stairs. PCP: No primary care provider on file. patient just moved in from [place]. Specialists: Dr. Rip Harbour. Orthopedic surgeon.  Chief Complaint: Loss of consciousness.  HPI: Kayla Thompson is a 79 y.o. female with history of diabetes mellitus type 2, hypertension, asthma, fibromyalgia was brought to the ER after patient had a fall followed by loss of consciousness. As per patient's daughter patient is walking from the bathroom towards the hall when patient suddenly fell and lost consciousness. Prior to the fall patient felt mildly dizzy and yelled patient's daughter's name. After the fall patient does not recall the incident. Patient fell straight face forward and hit her face. In the ER CT of the maxillofacial shows nasal fracture and also has left wrist fracture. EKG is unremarkable CT head is unremarkable and patient has been admitted for further management of syncope. Patient's daughter stated that she has had recurrent falls the last one was year ago. Patient denies any chest pain shortness of breath palpitations headache nausea vomiting or diarrhea. Denies any new medications recently started. There was no episodes of hypoglycemia. Denies any incontinence of urine no seizure-like activities.   Review of Systems: As presented in the history of presenting illness, rest negative.  Past Medical History  Diagnosis Date  . Hypertension   . Diabetes mellitus without complication   . Asthma   . Ovarian cancer   . Fibromyalgia    Past Surgical History  Procedure Laterality Date  . Abdominal hysterectomy    . Hip surgey    . Knee surgey     Social History:  reports that she has never smoked. She does not have any smokeless tobacco history on file. She reports that she does not drink alcohol. Her drug history is not on file. Where does patient live home. Can  patient participate in ADLs? Yes.  Allergies  Allergen Reactions  . Latex Rash  . Penicillins Rash    Family History:  Family History  Problem Relation Age of Onset  . Lung cancer Mother   . CAD Father       Prior to Admission medications   Medication Sig Start Date End Date Taking? Authorizing Provider  albuterol (PROVENTIL) (2.5 MG/3ML) 0.083% nebulizer solution Take 2.5 mg by nebulization daily.   Yes Historical Provider, MD  Calcium Carbonate (CALTRATE 600 PO) Take 1 tablet by mouth daily.   Yes Historical Provider, MD  carvedilol (COREG) 6.25 MG tablet Take 6.25 mg by mouth 2 (two) times daily with a meal.   Yes Historical Provider, MD  cholecalciferol (VITAMIN D) 1000 UNITS tablet Take 2,000 Units by mouth 2 (two) times daily.    Yes Historical Provider, MD  denosumab (PROLIA) 60 MG/ML SOLN injection Inject 60 mg into the skin every 6 (six) months. Administer in upper arm, thigh, or abdomen   Yes Historical Provider, MD  Dextromethorphan-Guaifenesin (MUCINEX DM MAXIMUM STRENGTH) 60-1200 MG TB12 Take 1 tablet by mouth 2 (two) times daily as needed (for cough/phlegm).   Yes Historical Provider, MD  DULoxetine (CYMBALTA) 60 MG capsule Take 60 mg by mouth daily.   Yes Historical Provider, MD  fluticasone (FLONASE) 50 MCG/ACT nasal spray Place 1-2 sprays into both nostrils daily as needed for allergies or rhinitis.   Yes Historical Provider, MD  Fluticasone-Salmeterol (ADVAIR) 500-50 MCG/DOSE AEPB Inhale 1 puff into the lungs 2 (two) times daily.   Yes Historical Provider, MD  insulin aspart (  NOVOLOG) 100 UNIT/ML injection Inject 4 Units into the skin 2 (two) times daily before a meal.   Yes Historical Provider, MD  insulin glargine (LANTUS) 100 UNIT/ML injection Inject 30 Units into the skin every morning.   Yes Historical Provider, MD  lansoprazole (PREVACID) 30 MG capsule Take 30 mg by mouth 2 (two) times daily.    Yes Historical Provider, MD  levothyroxine (SYNTHROID, LEVOTHROID) 50  MCG tablet Take 50 mcg by mouth daily before breakfast.   Yes Historical Provider, MD  montelukast (SINGULAIR) 10 MG tablet Take 10 mg by mouth daily.    Yes Historical Provider, MD  Multiple Vitamin (MULTIVITAMIN WITH MINERALS) TABS tablet Take 1 tablet by mouth daily. Women 50+   Yes Historical Provider, MD  NON FORMULARY 1 Syringe by Epidural route every 3 (three) months. Epidural Steroid Injections for Lumbar Spinal Stenosis up to 4 times a year   Yes Historical Provider, MD  Omega-3 Fatty Acids (FISH OIL) 1000 MG CAPS Take 1,000 mg by mouth daily.   Yes Historical Provider, MD  phenazopyridine (PYRIDIUM) 200 MG tablet Take 200 mg by mouth 3 (three) times daily as needed for pain.   Yes Historical Provider, MD  predniSONE (DELTASONE) 1 MG tablet Take 3 mg by mouth daily with breakfast.   Yes Historical Provider, MD  ranitidine (ZANTAC) 150 MG tablet Take 150 mg by mouth 2 (two) times daily as needed for heartburn.   Yes Historical Provider, MD  rosuvastatin (CRESTOR) 10 MG tablet Take 10 mg by mouth every evening.    Yes Historical Provider, MD  tiotropium (SPIRIVA) 18 MCG inhalation capsule Place 18 mcg into inhaler and inhale daily.   Yes Historical Provider, MD  vitamin B-12 (CYANOCOBALAMIN) 1000 MCG tablet Take 1,000 mcg by mouth daily.   Yes Historical Provider, MD  zolpidem (AMBIEN) 10 MG tablet Take 10 mg by mouth at bedtime.   Yes Historical Provider, MD    Physical Exam: Filed Vitals:   08/02/14 0324 08/02/14 0330 08/02/14 0400 08/02/14 0430  BP: 141/73 144/85 142/77 134/65  Pulse: 89 88 85 91  Temp: 97.9 F (36.6 C)     TempSrc: Oral     Resp: 22 19 25 24   Height: 5\' 4"  (1.626 m)     Weight: 68.947 kg (152 lb)     SpO2: 97% 97% 95% 95%     General:  Well-developed and nourished.  Eyes: Anicteric no pallor.  ENT: No discharge from the ears eyes nose and mouth.  Neck: No mass felt.  Cardiovascular: S1 and S2 heard.  Respiratory: No rhonchi or  crepitations.  Abdomen: Soft nontender bowel sounds present.  Skin: No rash.  Musculoskeletal: Left upper extremity is in splint.  Psychiatric: Appears normal.  Neurologic: Alert awake oriented to time place and person. Moves all extremities.  Labs on Admission:  Basic Metabolic Panel:  Recent Labs Lab 08/02/14 0147  NA 138  K 4.3  CL 103  GLUCOSE 201*  BUN 22*  CREATININE 0.90   Liver Function Tests: No results for input(s): AST, ALT, ALKPHOS, BILITOT, PROT, ALBUMIN in the last 168 hours. No results for input(s): LIPASE, AMYLASE in the last 168 hours. No results for input(s): AMMONIA in the last 168 hours. CBC:  Recent Labs Lab 08/02/14 0140 08/02/14 0147  WBC 15.9*  --   NEUTROABS 8.6*  --   HGB 11.7* 13.3  HCT 37.5 39.0  MCV 92.8  --   PLT 297  --    Cardiac  Enzymes: No results for input(s): CKTOTAL, CKMB, CKMBINDEX, TROPONINI in the last 168 hours.  BNP (last 3 results) No results for input(s): BNP in the last 8760 hours.  ProBNP (last 3 results) No results for input(s): PROBNP in the last 8760 hours.  CBG: No results for input(s): GLUCAP in the last 168 hours.  Radiological Exams on Admission: Dg Chest 2 View  08/02/2014   CLINICAL DATA:  [place] this morning.  EXAM: CHEST  2 VIEW  COMPARISON:  None.  FINDINGS: There is no pneumothorax. There is no effusion. Mediastinal and hilar contours are normal. Heart size is normal.  There is diffuse interstitial coarsening which may be chronic. There is no focal airspace consolidation. No displaced fractures are evident.  IMPRESSION: Diffuse chronic appearing interstitial coarsening. No evidence of pneumothorax or displaced fracture.   Electronically Signed   By: Andreas Newport M.D.   On: 08/02/2014 02:18   Dg Forearm Left  08/02/2014   CLINICAL DATA:  [place] in her den this morning  EXAM: LEFT FOREARM - 2 VIEW  COMPARISON:  None.  FINDINGS: The fractures of the distal radius and ulna are again evident. Remainder  of the forearm is intact. There is no radiopaque foreign body.  IMPRESSION: Fractures of the distal radius and ulna.   Electronically Signed   By: Andreas Newport M.D.   On: 08/02/2014 02:20   Dg Wrist Complete Left  08/02/2014   CLINICAL DATA:  [place] this morning  EXAM: LEFT WRIST - COMPLETE 3+ VIEW  COMPARISON:  None  FINDINGS: There is a comminuted mildly impacted intra-articular fracture of the distal radius. There is a transverse minimally displaced ulnar styloid fracture.  There is chondrocalcinosis about the proximal carpal row and moderate degenerative changes.  IMPRESSION: Fractures of the distal radius and ulna.   Electronically Signed   By: Andreas Newport M.D.   On: 08/02/2014 02:19   Ct Head Wo Contrast  08/02/2014   CLINICAL DATA:  Syncopal episode.  Fell in [employer].  EXAM: CT HEAD WITHOUT CONTRAST  CT MAXILLOFACIAL WITHOUT CONTRAST  CT CERVICAL SPINE WITHOUT CONTRAST  TECHNIQUE: Multidetector CT imaging of the head, cervical spine, and maxillofacial structures were performed using the standard protocol without intravenous contrast. Multiplanar CT image reconstructions of the cervical spine and maxillofacial structures were also generated.  COMPARISON:  None.  FINDINGS: CT HEAD FINDINGS  There is no intracranial hemorrhage, mass or evidence of acute infarction. There is moderate atrophy and periventricular hypodensity consistent with chronic small vessel disease. There is no extra-axial fluid collection. Brainstem and posterior fossa appear intact. No bony abnormalities are evident.  CT MAXILLOFACIAL FINDINGS  There are mildly depressed nasal bone fractures, worse on the right. Orbits are intact. Orbital floors are intact. Zygomatic arches and pterygoid plates are intact. Mandible is intact.  CT CERVICAL SPINE FINDINGS  The vertebral column, pedicles and facet articulations are intact. There is no evidence of acute fracture. No acute soft tissue abnormalities are evident.  Moderate  degenerative disc changes are present, greatest at C5-6 and C6-7.  IMPRESSION: 1. Negative for acute intracranial traumatic injury. 2. Moderate generalized atrophy and chronic small vessel ischemic disease. No acute intracranial findings. 3. Mildly depressed nasal bone fractures. 4. Negative for acute cervical spine fracture.   Electronically Signed   By: Andreas Newport M.D.   On: 08/02/2014 02:26   Ct Cervical Spine Wo Contrast  08/02/2014   CLINICAL DATA:  Syncopal episode.  Fell in [employer].  EXAM: CT HEAD WITHOUT  CONTRAST  CT MAXILLOFACIAL WITHOUT CONTRAST  CT CERVICAL SPINE WITHOUT CONTRAST  TECHNIQUE: Multidetector CT imaging of the head, cervical spine, and maxillofacial structures were performed using the standard protocol without intravenous contrast. Multiplanar CT image reconstructions of the cervical spine and maxillofacial structures were also generated.  COMPARISON:  None.  FINDINGS: CT HEAD FINDINGS  There is no intracranial hemorrhage, mass or evidence of acute infarction. There is moderate atrophy and periventricular hypodensity consistent with chronic small vessel disease. There is no extra-axial fluid collection. Brainstem and posterior fossa appear intact. No bony abnormalities are evident.  CT MAXILLOFACIAL FINDINGS  There are mildly depressed nasal bone fractures, worse on the right. Orbits are intact. Orbital floors are intact. Zygomatic arches and pterygoid plates are intact. Mandible is intact.  CT CERVICAL SPINE FINDINGS  The vertebral column, pedicles and facet articulations are intact. There is no evidence of acute fracture. No acute soft tissue abnormalities are evident.  Moderate degenerative disc changes are present, greatest at C5-6 and C6-7.  IMPRESSION: 1. Negative for acute intracranial traumatic injury. 2. Moderate generalized atrophy and chronic small vessel ischemic disease. No acute intracranial findings. 3. Mildly depressed nasal bone fractures. 4. Negative for acute  cervical spine fracture.   Electronically Signed   By: Andreas Newport M.D.   On: 08/02/2014 02:26   Dg Knee Complete 4 Views Left  08/02/2014   CLINICAL DATA:  Fall after syncopal episode last night. Generalize left knee pain.  EXAM: LEFT KNEE - COMPLETE 4+ VIEW  COMPARISON:  None.  FINDINGS: Degenerative changes in the left knee with medial greater than lateral compartment narrowing and associated osteophytes mostly on the medial compartment. Calcification in the joint suggesting chondrocalcinosis. No significant effusion. No acute fracture or dislocation.  IMPRESSION: Degenerative changes in the left knee mostly involving the medial compartment. No acute bony abnormalities.   Electronically Signed   By: Lucienne Capers M.D.   On: 08/02/2014 05:09   Ct Maxillofacial Wo Cm  08/02/2014   CLINICAL DATA:  Syncopal episode.  Fell in [employer].  EXAM: CT HEAD WITHOUT CONTRAST  CT MAXILLOFACIAL WITHOUT CONTRAST  CT CERVICAL SPINE WITHOUT CONTRAST  TECHNIQUE: Multidetector CT imaging of the head, cervical spine, and maxillofacial structures were performed using the standard protocol without intravenous contrast. Multiplanar CT image reconstructions of the cervical spine and maxillofacial structures were also generated.  COMPARISON:  None.  FINDINGS: CT HEAD FINDINGS  There is no intracranial hemorrhage, mass or evidence of acute infarction. There is moderate atrophy and periventricular hypodensity consistent with chronic small vessel disease. There is no extra-axial fluid collection. Brainstem and posterior fossa appear intact. No bony abnormalities are evident.  CT MAXILLOFACIAL FINDINGS  There are mildly depressed nasal bone fractures, worse on the right. Orbits are intact. Orbital floors are intact. Zygomatic arches and pterygoid plates are intact. Mandible is intact.  CT CERVICAL SPINE FINDINGS  The vertebral column, pedicles and facet articulations are intact. There is no evidence of acute fracture. No acute  soft tissue abnormalities are evident.  Moderate degenerative disc changes are present, greatest at C5-6 and C6-7.  IMPRESSION: 1. Negative for acute intracranial traumatic injury. 2. Moderate generalized atrophy and chronic small vessel ischemic disease. No acute intracranial findings. 3. Mildly depressed nasal bone fractures. 4. Negative for acute cervical spine fracture.   Electronically Signed   By: Andreas Newport M.D.   On: 08/02/2014 02:26    EKG: Independently reviewed. Normal sinus rhythm with QTC of 486 ms.  Assessment/Plan Principal  Problem:   Syncope Active Problems:   Diabetes mellitus type 2, controlled   Hypertension   HLD (hyperlipidemia)   Hypothyroidism   Left wrist fracture   1. Syncope - cause not clear. At this time we will closely follow telemetry for any arrhythmias. Check orthostatics. Check 2-D echo. Check MRI and EEG as patient does not recall the incident. 2. Left wrist fracture - patient's left wrist is in a splint. Patient is being followed at [facility] and follows with Dr. Rip Harbour. Please notify Dr. Rip Harbour in a.m. 3. Nasal fracture appears stable at this time. 4. Leukocytosis - patient is on long-term steroids for fibromyalgia which could cause leukocytosis. Patient's urine showing possible UTI for which I have placed patient on doxycycline for now. Follow urine cultures and CBC. 5. Hypertension - continue home medications. 6. Diabetes mellitus type 2 - continue home medications and follow CBGs closely. 7. History of asthma - presently not wheezing continue home medications.   DVT Prophylaxis SCDs.  Code Status: Full code.  Family Communication: Patient's daughter and grandson at the bedside.  Disposition Plan: Admit for observation.    Yobani Schertzer N. [facility] Pager [phone].  If 7PM-7AM, please contact night-coverage [email or web address] Password [facility] 08/02/2014, 5:41 AM

## 2014-08-02 NOTE — Progress Notes (Signed)
Orthopedic Tech Progress Note Patient Details:  Kayla Thompson Apr 24, 1935 [id number]  Ortho Devices Type of Ortho Device: Volar splint Ortho Device/Splint Interventions: Application   Katheren Shams 08/02/2014, 3:33 AM

## 2014-08-02 NOTE — Evaluation (Signed)
Occupational Therapy Evaluation Patient Details Name: Kayla Thompson MRN: [id number] DOB: 1935-12-29 Today's Date: 08/02/2014    History of Present Illness 79 y.o. female with history of diabetes mellitus type 2, hypertension, asthma, fibromyalgia and presented to [facility] status post fall and loss of consciousness at home. Patient reports feeling slightly dizzy prior to the fall.  pt sustained L distal wrist fracture which has been splinted.   Clinical Impression   Pt was admitted for the above.  All education was completed, and pt does not need further OT at this time.  She plans to have daughter assist with ADLs and potentially use BSC next to chair when she is alone until she feels back to baseline.    Follow Up Recommendations  No OT follow up    Equipment Recommendations  3 in 1 bedside comode    Recommendations for Other Services       Precautions / Restrictions Precautions Precautions: Fall Precaution Comments: orthostatic hypotension Required Braces or Orthoses: Other Brace/Splint Other Brace/Splint: L wrist in splint Restrictions Weight Bearing Restrictions: Yes LUE Weight Bearing: Non weight bearing Other Position/Activity Restrictions: pt educated to NWB to L wrist and hand      Mobility Bed Mobility Overal bed mobility: Needs Assistance Bed Mobility: Supine to Sit;Sit to Supine     Supine to sit: Supervision Sit to supine: Supervision   General bed mobility comments: HOB elevated  Transfers Overall transfer level: Needs assistance Equipment used:  (pushed IV pole) Transfers: Sit to/from Stand Sit to Stand: Supervision         General transfer comment: no dizziness at this time    Balance                                            ADL Overall ADL's : Needs assistance/impaired Eating/Feeding: Set up;Sitting   Grooming: Minimal assistance;Standing   Upper Body Bathing: Minimal assitance;Sitting   Lower Body Bathing: Minimal  assistance;Sit to/from stand   Upper Body Dressing : Minimal assistance;Sitting   Lower Body Dressing: Moderate assistance;Sit to/from stand   Toilet Transfer: Min guard;Ambulation (IV pole)   Toileting- Clothing Manipulation and Hygiene: Minimal assistance;Sit to/from stand         General ADL Comments: Pt ambulated to bathroom with min guard with IV pole.  No dizziness.  Pt has incontinence and needs to get up frequenly at night.  Recommended 3:1 commode for bedside and also for over toilet to have R armrest to push up from.  She will likely sponge bathe, as her shower chair is in storage in [place]--recently moved up here.  She has a grab bar but only has use of RUE at this time.  Pt states bed is high.  Educated that she could get stable step stool but she should turn a light on so she doesn't forget this and trip over it.  Daughter will stay with her over weekend and when she is not working.  Suggested pt use BSC next to chair at first, when she is alone, for increased safety.  Pt plans to have daughter assist with ADLs and she plans to not wear underwear or just loose PJ bottoms for ease with clothing management.  Reinforced positioning for edema management     Vision     Perception     Praxis      Pertinent Vitals/Pain Pain Assessment: 0-10 Pain  Score: 2  Pain Location: L wrist  Pain Descriptors / Indicators: Sore Pain Intervention(s): Limited activity within patient's tolerance;Monitored during session;Repositioned     Hand Dominance Right   Extremity/Trunk Assessment Upper Extremity Assessment Upper Extremity Assessment: LUE deficits/detail LUE Deficits / Details: LUE immobilized fingers to proximal of elbow LUE: Unable to fully assess due to immobilization          Communication Communication Communication: No difficulties   Cognition Arousal/Alertness: Awake/alert Behavior During Therapy: WFL for tasks assessed/performed Overall Cognitive Status: Within  Functional Limits for tasks assessed                     General Comments       Exercises       Shoulder Instructions      Home Living Family/patient expects to be discharged to:: Private residence Living Arrangements: Children Available Help at Discharge: Family;Available PRN/intermittently Type of Home: House       Home Layout: One level     Bathroom Shower/Tub: Tub/shower unit Shower/tub characteristics: Architectural technologist: Standard     Home Equipment: Cane - single point;Grab bars - tub/shower          Prior Functioning/Environment Level of Independence: Independent             OT Diagnosis: Generalized weakness   OT Problem List:     OT Treatment/Interventions:      OT Goals(Current goals can be found in the care plan section) Acute Rehab OT Goals Patient Stated Goal: get back to driving with GPS so she can learn [place]  OT Frequency:     Barriers to D/C:            Co-evaluation              End of Session    Activity Tolerance: Patient tolerated treatment well Patient left: in bed;with bed alarm set   Time: 1520-1538 OT Time Calculation (min): 18 min Charges:  OT General Charges $OT Visit: 1 Procedure OT Evaluation $Initial OT Evaluation Tier I: 1 Procedure G-Codes: OT G-codes **NOT FOR INPATIENT CLASS** Functional Assessment Tool Used: clinical observation and judgment Functional Limitation: Self care Self Care Current Status (X5170): At least 40 percent but less than 60 percent impaired, limited or restricted Self Care Goal Status (Y1749): At least 40 percent but less than 60 percent impaired, limited or restricted Self Care Discharge Status [phone]): At least 40 percent but less than 60 percent impaired, limited or restricted  Reniah Cottingham 08/02/2014, 4:02 PM   Lesle Chris, OTR/L [phone] 08/02/2014

## 2014-08-02 NOTE — Procedures (Signed)
ELECTROENCEPHALOGRAM REPORT  Date of Study: 08/02/2014  Patient's Name: Kayla Thompson MRN: [id number] Date of Birth: 14-Oct-1935  Referring Provider: Dr. Gean Birchwood  Clinical History: This is a 79 year old woman with a fall followed by loss of consciousness.   Medications: acetaminophen (TYLENOL) tablet 650 mg albuterol (PROVENTIL) (2.5 MG/3ML) 0.083% nebulizer solution 2.5 mg carvedilol (COREG) tablet 6.25 mg cholecalciferol (VITAMIN D) tablet 2,000 Units doxycycline (VIBRA-TABS) tablet 100 mg DULoxetine (CYMBALTA) DR capsule 60 mg famotidine (PEPCID) tablet 20 mg fluticasone (FLONASE) 50 MCG/ACT nasal spray 1-2 spray insulin glargine (LANTUS) injection 30 Units levothyroxine (SYNTHROID, LEVOTHROID) tablet 50 mcg mometasone-formoterol (DULERA) 200-5 MCG/ACT inhaler 2 puff montelukast (SINGULAIR) tablet 10 mg predniSONE (DELTASONE) tablet 3 mg rosuvastatin (CRESTOR) tablet 10 mg vitamin B-12 (CYANOCOBALAMIN) tablet 1,000 mcg zolpidem (AMBIEN) tablet 5 mg  Technical Summary: A multichannel digital EEG recording measured by the international 10-20 system with electrodes applied with paste and impedances below 5000 ohms performed in our laboratory with EKG monitoring in an awake and asleep patient.  Hyperventilation and photic stimulation were not performed.  The digital EEG was referentially recorded, reformatted, and digitally filtered in a variety of bipolar and referential montages for optimal display.    Description: The patient is awake and asleep during the recording.  During maximal wakefulness, there is a symmetric, medium voltage 9 Hz posterior dominant rhythm that attenuates with eye opening.  The record is symmetric.  There is an excess amount of diffuse low voltage sharply contoured beta activity seen throughout the recording. During drowsiness and sleep, there is an increase in theta slowing of the background, with shifting asymmetry seen over the bilateral temporal  regions, left greater than right. At times, this is sharply contoured over the temporal regions, without clear epileptogenic potential. Vertex waves and symmetric sleep spindles were seen.  Hyperventilation and photic stimulation were not performed.  There were no epileptiform discharges or electrographic seizures seen.    EKG lead was unremarkable.  Impression: This awake and asleep EEG is within normal limits except for excess amount of diffuse low voltage beta activity.  Clinical Correlation: Diffuse low voltage beta activity is commonly seen with sedating medications such as benzodiazepines.  In the absence of sedating medications, anxiety and hyperthyroidism may produce generalized beta activity.  The absence of epileptiform discharges does not exclude a clinical diagnosis of epilepsy. Clinical correlation is advised.   Ellouise Newer, M.D.

## 2014-08-02 NOTE — Evaluation (Signed)
Physical Therapy Evaluation Patient Details Name: Kayla Thompson MRN: [id number] DOB: Oct 07, 1935 Today's Date: 08/02/2014   History of Present Illness  79 y.o. female with history of diabetes mellitus type 2, hypertension, asthma, fibromyalgia and presented to [facility] status post fall and loss of consciousness at home. Patient reports feeling slightly dizzy prior to the fall.  pt sustained L distal wrist fracture which has been splinted.  Clinical Impression  Pt admitted with above diagnosis. Pt currently with functional limitations due to the deficits listed below (see PT Problem List).  Pt will benefit from skilled PT to increase their independence and safety with mobility to allow discharge to the venue listed below.   Pt with slight dizziness during mobility and educated to wait for decrease in dizziness/resolve prior to continuing.  Pt states she has SPC at home she can use and her daughter can assist has needed however works during the day.  Pt reports she was seeing orthopaedic MD who recommended outpatient PT so she plans to start with outpatient and states daughter can drive her to appointments.  Recommended pt have daughter present in case of dizziness upon mobilizing initially at home.  If pt remains, will continue to assist with mobility and [facility] training.     Follow Up Recommendations Outpatient PT;Supervision for mobility/OOB    Equipment Recommendations  None recommended by PT    Recommendations for Other Services       Precautions / Restrictions Precautions Precautions: Fall Precaution Comments: orthostatic hypotension Required Braces or Orthoses: Other Brace/Splint Other Brace/Splint: L wrist in splint Restrictions Weight Bearing Restrictions: Yes LUE Weight Bearing: Non weight bearing Other Position/Activity Restrictions: pt educated to NWB to L wrist and hand      Mobility  Bed Mobility Overal bed mobility: Needs Assistance Bed Mobility: Supine to Sit;Sit to Supine     Supine to sit: Supervision Sit to supine: Supervision   General bed mobility comments: verbal cues for limited L wrist WBing  Transfers Overall transfer level: Needs assistance Equipment used:  (pushed IV pole) Transfers: Sit to/from Stand Sit to Stand: Supervision         General transfer comment: verbal cues for waiting for dizziness to subside prior to continuing with mobility, pt educated to wait upon sitting and standing and then ambulating depending on level of dizziness for safety  Ambulation/Gait Ambulation/Gait assistance: Min guard;Supervision Ambulation Distance (Feet): 120 Feet Assistive device:  (pushed IV pole) Gait Pattern/deviations: Step-through pattern;Decreased stride length;Narrow base of support Gait velocity: decreased   General Gait Details: pt utilized IV pole for a little more support, states she has SPC at home and agreeable to use upon d/c for safety  Stairs            Wheelchair Mobility    Modified Rankin (Stroke Patients Only)       Balance                                             Pertinent Vitals/Pain Pain Assessment: 0-10 Pain Score: 3  Pain Location: L wrist Pain Descriptors / Indicators: Sore Pain Intervention(s): Limited activity within patient's tolerance;Monitored during session;Repositioned    Home Living Family/patient expects to be discharged to:: Private residence Living Arrangements: Children (daughter) Available Help at Discharge: Family;Available PRN/intermittently Type of Home: House       Home Layout: One level Home Equipment: Cane - single  point      Prior Function Level of Independence: Independent               Hand Dominance        Extremity/Trunk Assessment   Upper Extremity Assessment: LUE deficits/detail       LUE Deficits / Details: L wrist splinted due to fracture   Lower Extremity Assessment: Generalized weakness         Communication    Communication: No difficulties  Cognition Arousal/Alertness: Awake/alert Behavior During Therapy: WFL for tasks assessed/performed Overall Cognitive Status: Within Functional Limits for tasks assessed                      General Comments      Exercises        Assessment/Plan    PT Assessment Patient needs continued PT services  PT Diagnosis Difficulty walking;Acute pain   PT Problem List Decreased strength;Decreased balance;Decreased mobility;Decreased knowledge of use of DME;Pain  PT Treatment Interventions DME instruction;Gait training;Functional mobility training;Patient/family education;Therapeutic activities;Therapeutic exercise;Balance training   PT Goals (Current goals can be found in the Care Plan section) Acute Rehab PT Goals PT Goal Formulation: With patient Time For Goal Achievement: 08/09/14 Potential to Achieve Goals: Good    Frequency Min 3X/week   Barriers to discharge        Co-evaluation               End of Session Equipment Utilized During Treatment: Gait belt Activity Tolerance: Patient tolerated treatment well Patient left: in bed;with call bell/phone within reach Nurse Communication: Mobility status    Functional Assessment Tool Used: clinical judgement Functional Limitation: Mobility: Walking and moving around Mobility: Walking and Moving Around Current Status (V2536): At least 1 percent but less than 20 percent impaired, limited or restricted Mobility: Walking and Moving Around Goal Status [phone]): 0 percent impaired, limited or restricted    Time: 1313-1326 PT Time Calculation (min) (ACUTE ONLY): 13 min   Charges:   PT Evaluation $Initial PT Evaluation Tier I: 1 Procedure     PT G Codes:   PT G-Codes **NOT FOR INPATIENT CLASS** Functional Assessment Tool Used: clinical judgement Functional Limitation: Mobility: Walking and moving around Mobility: Walking and Moving Around Current Status (K7425): At least 1 percent but  less than 20 percent impaired, limited or restricted Mobility: Walking and Moving Around Goal Status [phone]): 0 percent impaired, limited or restricted    Yuchen Fedor,KATHrine E 08/02/2014, 2:42 PM Carmelia Bake, PT, DPT 08/02/2014 Pager: [phone]

## 2014-08-02 NOTE — Plan of Care (Signed)
Problem: Phase I Progression Outcomes Goal: OOB as tolerated unless otherwise ordered Outcome: Progressing Got up and walked in the hall with PT today, one assist.

## 2014-08-02 NOTE — ED Notes (Signed)
[facility] presents with a 79 yo female from home with a syncopal episode.  Pt was walking in kitchen when she fell out and daughter heard pt fall; Daughter stated she was out no more than 1 minute.  Pt c/o left shoulder, wrist and knee pain; Bruising at bridge of nose with a laceration and right cheek and brow pain.  12 lead unremarkable.  CBG 210.  Hx of HTN, diabetes.  Pt was orthostatic with [facility] with lying BP at 110/80 and sitting BP at 80/50.  Pt took sleeping pill earlier around 8 pm.

## 2014-08-02 NOTE — ED Notes (Signed)
Bed: ES92 Expected date: 08/02/14 Expected time: 12:22 AM Means of arrival: Ambulance Comments: Fall, wrist and knee pain

## 2014-08-02 NOTE — Progress Notes (Signed)
Patient ID: Kayla Thompson, female   DOB: [birth year], 79 y.o.   MRN: [id number]  [facility] PROGRESS NOTE  Kayla Thompson FAO:[id number] DOB: 1935-05-26 DOA: 08/02/2014 PCP: No primary care provider on file. appreciate case management providing information on community health and wellness clinic so patient can have primary care physician established while she is here is in hospital  Brief narrative:    Addendum to admission note done 08/02/2014 79 y.o. female with history of diabetes mellitus type 2, hypertension, asthma, fibromyalgia. Patient presented to [facility] long hospital status post fall and loss of consciousness at home. Patient reports feeling slightly dizzy prior to the fall  On admission, patient was hemodynamically stable. Maxillofacial CT showed nasal fracture. She was also found to have fracture of the distal radius and ulna. Patient was admitted for further evaluation and management.   Assessment/Plan:    Principal Problem:   Syncope / fall - Unclear etiology of fall and syncope. - Patient reports feeling much better this morning. - Order placed for physical therapy evaluation - As mentioned above, CT head showed nasal fractures otherwise no acute intracranial findings. MRI of the brain did not reveal acute intracranial findings either.  Active Problems:   Diabetes mellitus type 2, controlled - Continue current insulin, Lantus 30 units daily    Hypothyroidism - Continue Synthroid    Left wrist fracture - In splint. Provide supportive care.   DVT Prophylaxis  - SCDs bilaterally   Code Status: Full.  Family Communication:  plan of care discussed with the patient; family not at the bedside. Disposition Plan: Home 08/03/2014.  IV access:  Peripheral IV  Procedures and diagnostic studies:    Dg Chest 2 View 08/02/2014  Diffuse chronic appearing interstitial coarsening. No evidence of pneumothorax or displaced fracture.   Electronically Signed   By: Andreas Newport M.D.   On: 08/02/2014 02:18   Dg Forearm Left 08/02/2014 Fractures of the distal radius and ulna.   Electronically Signed   By: Andreas Newport M.D.   On: 08/02/2014 02:20   Dg Wrist Complete Left 08/02/2014    Fractures of the distal radius and ulna.   Electronically Signed   By: Andreas Newport M.D.   On: 08/02/2014 02:19   Ct Head Wo Contrast 08/02/2014   1. Negative for acute intracranial traumatic injury. 2. Moderate generalized atrophy and chronic small vessel ischemic disease. No acute intracranial findings. 3. Mildly depressed nasal bone fractures. 4. Negative for acute cervical spine fracture.   Electronically Signed   By: Andreas Newport M.D.   On: 08/02/2014 02:26   Ct Cervical Spine Wo Contrast 08/02/2014  1. Negative for acute intracranial traumatic injury. 2. Moderate generalized atrophy and chronic small vessel ischemic disease. No acute intracranial findings. 3. Mildly depressed nasal bone fractures. 4. Negative for acute cervical spine fracture.   Electronically Signed   By: Andreas Newport M.D.   On: 08/02/2014 02:26   Mr Brain Wo Contrast 08/02/2014   1. No acute findings, including infarct. 2. Mild for age cerebral atrophy and small vessel disease. 3. Chronic right mastoiditis.   Electronically Signed   By: Monte Fantasia M.D.   On: 08/02/2014 11:38   Dg Knee Complete 4 Views Left 08/02/2014   Degenerative changes in the left knee mostly involving the medial compartment. No acute bony abnormalities.   Electronically Signed   By: Lucienne Capers M.D.   On: 08/02/2014 05:09   Ct Maxillofacial Wo Cm 08/02/2014    1.  Negative for acute intracranial traumatic injury. 2. Moderate generalized atrophy and chronic small vessel ischemic disease. No acute intracranial findings. 3. Mildly depressed nasal bone fractures. 4. Negative for acute cervical spine fracture.   Electronically Signed   By: Andreas Newport M.D.   On: 08/02/2014 02:26    Medical Consultants:  None  Other  Consultants:  Physical therapy  IAnti-Infectives:   None     Leisa Lenz, MD  [facility] Pager [phone]  Time spent in minutes: 15 minutes  If 7PM-7AM, please contact night-coverage [email or web address] Password [facility] 08/02/2014, 12:39 PM   LOS: 0 days    HPI/Subjective: No acute overnight events. Patient reports feeling better since admission. She has pain in the left arm.  Objective: Filed Vitals:   08/02/14 0541 08/02/14 0614 08/02/14 0634 08/02/14 0923  BP: 129/71 152/75 135/59   Pulse: 91 84 83   Temp:  98.1 F (36.7 C)    TempSrc:  Oral    Resp: 23 21    Height:  5' (1.524 m)    Weight:  68.992 kg (152 lb 1.6 oz)    SpO2: 95% 98%  98%   No intake or output data in the 24 hours ending 08/02/14 1239  Exam:   General:  Pt is alert, follows commands appropriately, not in acute distress  Cardiovascular: Regular rate and rhythm, S1/S2, no murmurs  Respiratory: Clear to auscultation bilaterally, no wheezing, no crackles, no rhonchi  Abdomen: Soft, non tender, non distended, bowel sounds present  Extremities: No edema, pulses DP and PT palpable bilaterally; left arm in splint  Neuro: Grossly nonfocal  Data Reviewed: Basic Metabolic Panel:  Recent Labs Lab 08/02/14 0147 08/02/14 0810  NA 138 138  K 4.3 3.6  CL 103 103  CO2  --  25  GLUCOSE 201* 130*  BUN 22* 16  CREATININE 0.90 0.79  CALCIUM  --  8.0*   Liver Function Tests:  Recent Labs Lab 08/02/14 0810  AST 15  ALT 14  ALKPHOS 47  BILITOT 0.6  PROT 6.1*  ALBUMIN 3.2*   No results for input(s): LIPASE, AMYLASE in the last 168 hours. No results for input(s): AMMONIA in the last 168 hours. CBC:  Recent Labs Lab 08/02/14 0140 08/02/14 0147 08/02/14 0810  WBC 15.9*  --  14.8*  NEUTROABS 8.6*  --  6.6  HGB 11.7* 13.3 10.1*  HCT 37.5 39.0 32.4*  MCV 92.8  --  92.0  PLT 297  --  307   Cardiac Enzymes: No results for input(s): CKTOTAL, CKMB, CKMBINDEX, TROPONINI in the last  168 hours. BNP: Invalid input(s): POCBNP CBG: No results for input(s): GLUCAP in the last 168 hours.  No results found for this or any previous visit (from the past 240 hour(s)).   Scheduled Meds: . albuterol  2.5 mg Nebulization Daily  . carvedilol  6.25 mg Oral BID WC  . cholecalciferol  2,000 Units Oral BID  . doxycycline  100 mg Oral Q12H  . DULoxetine  60 mg Oral Daily  . famotidine  20 mg Oral BID  . insulin glargine  30 Units Subcutaneous q morning - 10a  . levothyroxine  50 mcg Oral QAC breakfast  . mometasone-formoterol  2 puff Inhalation BID  . montelukast  10 mg Oral Daily  . omega-3 acid ethyl esters  1 g Oral Daily  . pantoprazole  40 mg Oral Daily  . predniSONE  3 mg Oral Q breakfast  . rosuvastatin  10 mg Oral QPM  .  tiotropium  18 mcg Inhalation Daily  . vitamin B-12  1,000 mcg Oral Daily  . zolpidem  5 mg Oral QHS   Continuous Infusions: . sodium chloride 75 mL/hr at 08/02/14 1203

## 2014-08-02 NOTE — Progress Notes (Signed)
  Echocardiogram 2D Echocardiogram has been performed.  Kayla Thompson 08/02/2014, 12:55 PM

## 2014-08-02 NOTE — Progress Notes (Signed)
Spoke with pt concerning [facility] needs. PT recommendation was OP PT.  Pt states that she is going to use [facility].  Pt states, There are no other needs at present time.

## 2014-08-03 DIAGNOSIS — S62102S Fracture of unspecified carpal bone, left wrist, sequela: Secondary | ICD-10-CM | POA: Diagnosis not present

## 2014-08-03 DIAGNOSIS — W19XXXS Unspecified fall, sequela: Secondary | ICD-10-CM | POA: Diagnosis not present

## 2014-08-03 DIAGNOSIS — R55 Syncope and collapse: Secondary | ICD-10-CM | POA: Diagnosis not present

## 2014-08-03 DIAGNOSIS — E119 Type 2 diabetes mellitus without complications: Secondary | ICD-10-CM | POA: Diagnosis not present

## 2014-08-03 LAB — URINE CULTURE: Colony Count: >=100000

## 2014-08-03 LAB — GLUCOSE, CAPILLARY: Glucose-Capillary: 97 mg/dL (ref 65–99)

## 2014-08-03 MED ORDER — DOXYCYCLINE HYCLATE 100 MG PO TABS: 100.0000 mg | ORAL_TABLET | Freq: Two times a day (BID) | ORAL | Status: DC

## 2014-08-03 NOTE — Discharge Instructions (Signed)
Syncope °Syncope is a medical term for fainting or passing out. This means you lose consciousness and drop to the ground. People are generally unconscious for less than 5 minutes. You may have some muscle twitches for up to 15 seconds before waking up and returning to normal. Syncope occurs more often in older adults, but it can happen to anyone. While most causes of syncope are not dangerous, syncope can be a sign of a serious medical problem. It is important to seek medical care.  °CAUSES  °Syncope is caused by a sudden drop in blood flow to the brain. The specific cause is often not determined. Factors that can bring on syncope include: °· Taking medicines that lower blood pressure. °· Sudden changes in posture, such as standing up quickly. °· Taking more medicine than prescribed. °· Standing in one place for too long. °· Seizure disorders. °· Dehydration and excessive exposure to heat. °· Low blood sugar (hypoglycemia). °· Straining to have a bowel movement. °· Heart disease, irregular heartbeat, or other circulatory problems. °· Fear, emotional distress, seeing blood, or severe pain. °SYMPTOMS  °Right before fainting, you may: °· Feel dizzy or light-headed. °· Feel nauseous. °· See all white or all black in your field of vision. °· Have cold, clammy skin. °DIAGNOSIS  °Your health care provider will ask about your symptoms, perform a physical exam, and perform an electrocardiogram (ECG) to record the electrical activity of your heart. Your health care provider may also perform other heart or blood tests to determine the cause of your syncope which may include: °· Transthoracic echocardiogram (TTE). During echocardiography, sound waves are used to evaluate how blood flows through your heart. °· Transesophageal echocardiogram (TEE). °· Cardiac monitoring. This allows your health care provider to monitor your heart rate and rhythm in real time. °· Holter monitor. This is a portable device that records your  heartbeat and can help diagnose heart arrhythmias. It allows your health care provider to track your heart activity for several days, if needed. °· Stress tests by exercise or by giving medicine that makes the heart beat faster. °TREATMENT  °In most cases, no treatment is needed. Depending on the cause of your syncope, your health care provider may recommend changing or stopping some of your medicines. °HOME CARE INSTRUCTIONS °· Have someone stay with you until you feel stable. °· Do not drive, use machinery, or play sports until your health care provider says it is okay. °· Keep all follow-up appointments as directed by your health care provider. °· Lie down right away if you start feeling like you might faint. Breathe deeply and steadily. Wait until all the symptoms have passed. °· Drink enough fluids to keep your urine clear or pale yellow. °· If you are taking blood pressure or heart medicine, get up slowly and take several minutes to sit and then stand. This can reduce dizziness. °SEEK IMMEDIATE MEDICAL CARE IF:  °· You have a severe headache. °· You have unusual pain in the chest, abdomen, or back. °· You are bleeding from your mouth or rectum, or you have black or tarry stool. °· You have an irregular or very fast heartbeat. °· You have pain with breathing. °· You have repeated fainting or seizure-like jerking during an episode. °· You faint when sitting or lying down. °· You have confusion. °· You have trouble walking. °· You have severe weakness. °· You have vision problems. °If you fainted, call your local emergency services (911 in [place] Do not drive   yourself to the hospital.  °MAKE SURE YOU: °· Understand these instructions. °· Will watch your condition. °· Will get help right away if you are not doing well or get worse. °Document Released: 03/08/2005 Document Revised: 03/13/2013 Document Reviewed: 05/07/2011 °[employer]® Patient Information ©2015 [employer]. This information is not intended to replace  advice given to you by your health care provider. Make sure you discuss any questions you have with your health care provider. ° °

## 2014-08-03 NOTE — Discharge Summary (Signed)
Physician Discharge Summary  Kayla Thompson [name] DOB: 1936-02-11 DOA: 08/02/2014  PCP: No primary care provider on file. Pt is new to the area and her daughter is trying to establish PCP. For now she will follow up with [facility] for her hand fracture   Admit date: 08/02/2014 Discharge date: 08/03/2014  Recommendations for Outpatient Follow-up:  1. Follow up with [facility] as scheduled 2. Please follow up with PCP as soon as follow up after this hospitalization  3. Doxycycline on discharge for UTI for 5 days.   Discharge Diagnoses:  Principal Problem:   Syncope Active Problems:   Diabetes mellitus type 2, controlled   Hypertension   HLD (hyperlipidemia)   Hypothyroidism   Left wrist fracture   Fall    Discharge Condition: stable   Diet recommendation: as tolerated   History of present illness:  79 y.o. female with history of diabetes mellitus type 2, hypertension, asthma, fibromyalgia. Patient presented to [facility] long hospital status post fall and loss of consciousness at home. Patient reports feeling slightly dizzy prior to the fall  On admission, patient was hemodynamically stable. Maxillofacial CT showed nasal fracture. She was also found to have fracture of the distal radius and ulna. EEG and MRI brain with no acute findings.    Hospital Course:   Principal Problem:  Syncope / fall - Unclear etiology of fall and syncope. - Per PT eval - outpt physical therapy recommended  - CT head and MRI did not reveal acute intracranial findings. - EEG was normal - 2-D echo showed normal ejection fraction - Patient reports feeling much better this morning. - Order placed for physical therapy evaluation - As mentioned above, CT head showed nasal fractures otherwise no acute intracranial findings. MRI of the brain did not reveal acute intracranial findings either.  Active Problems:  Diabetes mellitus type 2, controlled - Continue current  insulin, Lantus 30 units daily   Hypothyroidism - Continue Synthroid   Left wrist fracture - In splint. Pain meds tylenol or ibuprofen on discharge if needed.    Essential hypertension - Continue carvedilol 6.25 mg twice daily    UTI / Leukocytosis  - Small leukocytes on UA on admission - Urine culture not collected at the time of the admission - Pt on empiric doxycycline for 5 days on discharge  - Of note, leukocytosis probably present due to chronic steroids     Dyslipidemia  - Continue Crestor    DVT Prophylaxis  - SCDs bilaterally   Code Status: Full.     IV access:  Peripheral IV  Procedures and diagnostic studies:   Dg Chest 2 View 08/02/2014 Diffuse chronic appearing interstitial coarsening. No evidence of pneumothorax or displaced fracture. Electronically Signed By: Kayla Thompson M.D. On: 08/02/2014 02:18   Dg Forearm Left 08/02/2014 Fractures of the distal radius and ulna. Electronically Signed By: Kayla Thompson M.D. On: 08/02/2014 02:20   Dg Wrist Complete Left 08/02/2014 Fractures of the distal radius and ulna. Electronically Signed By: Kayla Thompson M.D. On: 08/02/2014 02:19   Ct Head Wo Contrast 08/02/2014 1. Negative for acute intracranial traumatic injury. 2. Moderate generalized atrophy and chronic small vessel ischemic disease. No acute intracranial findings. 3. Mildly depressed nasal bone fractures. 4. Negative for acute cervical spine fracture. Electronically Signed By: Kayla Thompson M.D. On: 08/02/2014 02:26   Ct Cervical Spine Wo Contrast 08/02/2014 1. Negative for acute intracranial traumatic injury. 2. Moderate generalized atrophy and chronic small vessel ischemic disease. No acute  intracranial findings. 3. Mildly depressed nasal bone fractures. 4. Negative for acute cervical spine fracture. Electronically Signed By: Kayla Thompson M.D. On: 08/02/2014 02:26   Mr Brain Wo Contrast 08/02/2014  1. No acute findings, including infarct. 2. Mild for age cerebral atrophy and small vessel disease. 3. Chronic right mastoiditis. Electronically Signed By: Kayla Thompson M.D. On: 08/02/2014 11:38   Dg Knee Complete 4 Views Left 08/02/2014 Degenerative changes in the left knee mostly involving the medial compartment. No acute bony abnormalities. Electronically Signed By: Kayla Thompson M.D. On: 08/02/2014 05:09   Ct Maxillofacial Wo Cm 08/02/2014 1. Negative for acute intracranial traumatic injury. 2. Moderate generalized atrophy and chronic small vessel ischemic disease. No acute intracranial findings. 3. Mildly depressed nasal bone fractures. 4. Negative for acute cervical spine fracture. Electronically Signed By: Kayla Thompson M.D. On: 08/02/2014 02:26    Medical Consultants:  None  Other Consultants:  Physical therapy    Signed:  Leisa Lenz, MD  [facility] 08/03/2014, 8:53 AM  Pager #: [phone]  Time spent in minutes: more than 30 minutes   Discharge Exam: Filed Vitals:   08/03/14 0516  BP: 124/71  Pulse: 76  Temp: 97.6 F (36.4 C)  Resp: 21   Filed Vitals:   08/02/14 1500 08/02/14 2123 08/02/14 2134 08/03/14 0516  BP: 132/63  135/63 124/71  Pulse: 78  82 76  Temp: 98.3 F (36.8 C)  98.5 F (36.9 C) 97.6 F (36.4 C)  TempSrc: Oral  Oral Oral  Resp: 22  22 21   Height:      Weight:      SpO2: 97% 96% 99% 97%    General: Pt is alert, follows commands appropriately, not in acute distress Cardiovascular: Regular rate and rhythm, S1/S2 +, no murmurs Respiratory: Clear to auscultation bilaterally, no wheezing, no crackles, no rhonchi Abdominal: Soft, non tender, non distended, bowel sounds +, no guarding Extremities: no edema, no cyanosis, pulses palpable bilaterally DP and PT Neuro: Grossly nonfocal  Discharge Instructions  Discharge Instructions    Call MD for:  difficulty breathing, headache or visual  disturbances    Complete by:  As directed      Call MD for:  persistant nausea and vomiting    Complete by:  As directed      Call MD for:  severe uncontrolled pain    Complete by:  As directed      Diet - low sodium heart healthy    Complete by:  As directed      Increase activity slowly    Complete by:  As directed             Medication List    TAKE these medications        albuterol (2.5 MG/3ML) 0.083% nebulizer solution  Commonly known as:  PROVENTIL  Take 2.5 mg by nebulization daily.     CALTRATE 600 PO  Take 1 tablet by mouth daily.     carvedilol 6.25 MG tablet  Commonly known as:  COREG  Take 6.25 mg by mouth 2 (two) times daily with a meal.     cholecalciferol 1000 UNITS tablet  Commonly known as:  VITAMIN D  Take 2,000 Units by mouth 2 (two) times daily.     denosumab 60 MG/ML Soln injection  Commonly known as:  PROLIA  Inject 60 mg into the skin every 6 (six) months. Administer in upper arm, thigh, or abdomen     doxycycline 100 MG tablet  Commonly  known as:  VIBRA-TABS  Take 1 tablet (100 mg total) by mouth every 12 (twelve) hours.     DULoxetine 60 MG capsule  Commonly known as:  CYMBALTA  Take 60 mg by mouth daily.     Fish Oil 1000 MG Caps  Take 1,000 mg by mouth daily.     fluticasone 50 MCG/ACT nasal spray  Commonly known as:  FLONASE  Place 1-2 sprays into both nostrils daily as needed for allergies or rhinitis.     Fluticasone-Salmeterol 500-50 MCG/DOSE Aepb  Commonly known as:  ADVAIR  Inhale 1 puff into the lungs 2 (two) times daily.     insulin aspart 100 UNIT/ML injection  Commonly known as:  novoLOG  Inject 4 Units into the skin 2 (two) times daily before a meal.     insulin glargine 100 UNIT/ML injection  Commonly known as:  LANTUS  Inject 30 Units into the skin every morning.     lansoprazole 30 MG capsule  Commonly known as:  PREVACID  Take 30 mg by mouth 2 (two) times daily.     levothyroxine 50 MCG tablet  Commonly  known as:  SYNTHROID, LEVOTHROID  Take 50 mcg by mouth daily before breakfast.     montelukast 10 MG tablet  Commonly known as:  SINGULAIR  Take 10 mg by mouth daily.     MUCINEX DM MAXIMUM STRENGTH 60-1200 MG Tb12  Take 1 tablet by mouth 2 (two) times daily as needed (for cough/phlegm).     multivitamin with minerals Tabs tablet  Take 1 tablet by mouth daily. Women 50+     NON FORMULARY  1 Syringe by Epidural route every 3 (three) months. Epidural Steroid Injections for Lumbar Spinal Stenosis up to 4 times a year     phenazopyridine 200 MG tablet  Commonly known as:  PYRIDIUM  Take 200 mg by mouth 3 (three) times daily as needed for pain.     predniSONE 1 MG tablet  Commonly known as:  DELTASONE  Take 3 mg by mouth daily with breakfast.     ranitidine 150 MG tablet  Commonly known as:  ZANTAC  Take 150 mg by mouth 2 (two) times daily as needed for heartburn.     rosuvastatin 10 MG tablet  Commonly known as:  CRESTOR  Take 10 mg by mouth every evening.     tiotropium 18 MCG inhalation capsule  Commonly known as:  SPIRIVA  Place 18 mcg into inhaler and inhale daily.     vitamin B-12 1000 MCG tablet  Commonly known as:  CYANOCOBALAMIN  Take 1,000 mcg by mouth daily.     zolpidem 10 MG tablet  Commonly known as:  AMBIEN  Take 10 mg by mouth at bedtime.          The results of significant diagnostics from this hospitalization (including imaging, microbiology, ancillary and laboratory) are listed below for reference.    Significant Diagnostic Studies: Dg Chest 2 View  08/02/2014   CLINICAL DATA:  [place] this morning.  EXAM: CHEST  2 VIEW  COMPARISON:  None.  FINDINGS: There is no pneumothorax. There is no effusion. Mediastinal and hilar contours are normal. Heart size is normal.  There is diffuse interstitial coarsening which may be chronic. There is no focal airspace consolidation. No displaced fractures are evident.  IMPRESSION: Diffuse chronic appearing interstitial  coarsening. No evidence of pneumothorax or displaced fracture.   Electronically Signed   By: Kayla Thompson M.D.   On:  08/02/2014 02:18   Dg Forearm Left  08/02/2014   CLINICAL DATA:  [place] in her den this morning  EXAM: LEFT FOREARM - 2 VIEW  COMPARISON:  None.  FINDINGS: The fractures of the distal radius and ulna are again evident. Remainder of the forearm is intact. There is no radiopaque foreign body.  IMPRESSION: Fractures of the distal radius and ulna.   Electronically Signed   By: Kayla Thompson M.D.   On: 08/02/2014 02:20   Dg Wrist Complete Left  08/02/2014   CLINICAL DATA:  [place] this morning  EXAM: LEFT WRIST - COMPLETE 3+ VIEW  COMPARISON:  None  FINDINGS: There is a comminuted mildly impacted intra-articular fracture of the distal radius. There is a transverse minimally displaced ulnar styloid fracture.  There is chondrocalcinosis about the proximal carpal row and moderate degenerative changes.  IMPRESSION: Fractures of the distal radius and ulna.   Electronically Signed   By: Kayla Thompson M.D.   On: 08/02/2014 02:19   Ct Head Wo Contrast  08/02/2014   CLINICAL DATA:  Syncopal episode.  Fell in [employer].  EXAM: CT HEAD WITHOUT CONTRAST  CT MAXILLOFACIAL WITHOUT CONTRAST  CT CERVICAL SPINE WITHOUT CONTRAST  TECHNIQUE: Multidetector CT imaging of the head, cervical spine, and maxillofacial structures were performed using the standard protocol without intravenous contrast. Multiplanar CT image reconstructions of the cervical spine and maxillofacial structures were also generated.  COMPARISON:  None.  FINDINGS: CT HEAD FINDINGS  There is no intracranial hemorrhage, mass or evidence of acute infarction. There is moderate atrophy and periventricular hypodensity consistent with chronic small vessel disease. There is no extra-axial fluid collection. Brainstem and posterior fossa appear intact. No bony abnormalities are evident.  CT MAXILLOFACIAL FINDINGS  There are mildly depressed nasal bone  fractures, worse on the right. Orbits are intact. Orbital floors are intact. Zygomatic arches and pterygoid plates are intact. Mandible is intact.  CT CERVICAL SPINE FINDINGS  The vertebral column, pedicles and facet articulations are intact. There is no evidence of acute fracture. No acute soft tissue abnormalities are evident.  Moderate degenerative disc changes are present, greatest at C5-6 and C6-7.  IMPRESSION: 1. Negative for acute intracranial traumatic injury. 2. Moderate generalized atrophy and chronic small vessel ischemic disease. No acute intracranial findings. 3. Mildly depressed nasal bone fractures. 4. Negative for acute cervical spine fracture.   Electronically Signed   By: Kayla Thompson M.D.   On: 08/02/2014 02:26   Ct Cervical Spine Wo Contrast  08/02/2014   CLINICAL DATA:  Syncopal episode.  Fell in [employer].  EXAM: CT HEAD WITHOUT CONTRAST  CT MAXILLOFACIAL WITHOUT CONTRAST  CT CERVICAL SPINE WITHOUT CONTRAST  TECHNIQUE: Multidetector CT imaging of the head, cervical spine, and maxillofacial structures were performed using the standard protocol without intravenous contrast. Multiplanar CT image reconstructions of the cervical spine and maxillofacial structures were also generated.  COMPARISON:  None.  FINDINGS: CT HEAD FINDINGS  There is no intracranial hemorrhage, mass or evidence of acute infarction. There is moderate atrophy and periventricular hypodensity consistent with chronic small vessel disease. There is no extra-axial fluid collection. Brainstem and posterior fossa appear intact. No bony abnormalities are evident.  CT MAXILLOFACIAL FINDINGS  There are mildly depressed nasal bone fractures, worse on the right. Orbits are intact. Orbital floors are intact. Zygomatic arches and pterygoid plates are intact. Mandible is intact.  CT CERVICAL SPINE FINDINGS  The vertebral column, pedicles and facet articulations are intact. There is no evidence of acute fracture. No  acute soft tissue  abnormalities are evident.  Moderate degenerative disc changes are present, greatest at C5-6 and C6-7.  IMPRESSION: 1. Negative for acute intracranial traumatic injury. 2. Moderate generalized atrophy and chronic small vessel ischemic disease. No acute intracranial findings. 3. Mildly depressed nasal bone fractures. 4. Negative for acute cervical spine fracture.   Electronically Signed   By: Kayla Thompson M.D.   On: 08/02/2014 02:26   Mr Brain Wo Contrast  08/02/2014   CLINICAL DATA:  Recent syncopal episodes. Previous fall. Evaluate for CVA.  EXAM: MRI HEAD WITHOUT CONTRAST  TECHNIQUE: Multiplanar, multiecho pulse sequences of the brain and surrounding structures were obtained without intravenous contrast.  COMPARISON:  Head CT from the same day  FINDINGS: Calvarium and upper cervical spine: No focal marrow signal abnormality. Degenerative disc narrowing in the visible upper cervical spine with advanced facet arthropathy and mild C2-3 anterolisthesis.  Orbits: No significant findings.  Sinuses and Mastoids: Clear paranasal sinuses. There is fluid or mucosal thickening in the right mastoid tip with sclerosis on previous head CT, consistent with chronic mastoiditis.  Brain: No acute infarct, hemorrhage, hydrocephalus, or mass lesion. No evidence of large vessel occlusion. Mild generalized cerebral volume loss. Mild chronic small vessel disease with ischemic gliosis around the lateral ventricles and in the right frontal white matter.  IMPRESSION: 1. No acute findings, including infarct. 2. Mild for age cerebral atrophy and small vessel disease. 3. Chronic right mastoiditis.   Electronically Signed   By: Kayla Thompson M.D.   On: 08/02/2014 11:38   Dg Bone Density  07/25/2014   CLINICAL DATA:  79 year old postmenopausal Caucasian female. Prior history of right hip, right ankle and right wrist fractures from simple falls. Prior 10 year history of glucocorticoid therapy for fibromyalgia. Current history of  hypothyroidism. Currently undergoing calcium and vitamin-D therapy. Currently undergoing bone building therapy with Prolia. Baseline evaluation.  EXAM: DUAL X-RAY ABSORPTIOMETRY (DXA) FOR BONE MINERAL DENSITY  FINDINGS: AP LUMBAR SPINE (L1, L2)  Bone Mineral Density (BMD):  0.970 g/cm2  Young Adult T-Score:  -0.1  Z-Score:  2.3  LEFT FEMUR NECK  Bone Mineral Density (BMD):  0.547 g/cm2  Young Adult T-Score: -2.7  Z-Score:  -0.5  L3 and L4 were excluded from the lumbar spine determination as their T-scores are greater than 1 standard deviation higher than that at L2 due to degenerative changes.  ASSESSMENT: Patient's diagnostic category is OSTEOPOROSIS by WHO Criteria.  FRACTURE RISK: Increased.  FRAX: World Health Organization FRAX assessment of absolute fracture risk is not calculated for this patient because the patient has osteoporosis and is undergoing bone building therapy currently.  COMPARISON: None.  Effective therapies are available in the form of bisphosphonates, selective estrogen receptor modulators, biologic agents, and hormone replacement therapy (for women). All patients should ensure an adequate intake of dietary calcium (1200 mg daily) and vitamin D (800 IU daily) unless contraindicated.  All treatment decisions require clinical judgment and consideration of individual patient factors, including patient preferences, co-morbidities, previous drug use, risk factors not captured in the FRAX model (e.g., frailty, falls, vitamin D deficiency, increased bone turnover, interval significant decline in bone density) and possible under- or over-estimation of fracture risk by FRAX.  The National Osteoporosis Foundation recommends that FDA-approved medical therapies be considered in postmenopausal women and men age 63 or older with a:  1. Hip or vertebral (clinical or morphometric) fracture.  2. T-score of -2.5 or lower at the spine or hip.  3. Ten-year fracture probability by  FRAX of 3% or greater for hip  fracture or 20% or greater for major osteoporotic fracture.  People with diagnosed cases of osteoporosis or at high risk for fracture should have regular bone mineral density tests. For patients eligible for Medicare, routine testing is allowed once every 2 years. The testing frequency can be increased to one year for patients who have rapidly progressing disease, those who are receiving or discontinuing medical therapy to restore bone mass, or have additional risk factors.  World Pharmacologist [facility]) Criteria:  Normal: T-scores from +1.0 to -1.0  Low Bone Mass (Osteopenia): T-scores between -1.0 and -2.5  Osteoporosis: T-scores -2.5 and below  Comparison to Reference Population:  T-score is the key measure used in the diagnosis of osteoporosis and relative risk determination for fracture. It provides a value for bone mass relative to the mean bone mass of a young adult reference population expressed in terms of standard deviation (SD).  Z-score is the age-matched score showing the patient's values compared to a population matched for age, sex, and race. This is also expressed in terms of standard deviation. The patient may have values that compare favorably to the age-matched values and still be at increased risk for fracture.  Interpreted by:  Peggye Fothergill, MD, CCD  (Certified Clinical Densitometrist)   Electronically Signed   By: Evangeline Dakin M.D.   On: 07/25/2014 15:09   Dg Knee Complete 4 Views Left  08/02/2014   CLINICAL DATA:  Fall after syncopal episode last night. Generalize left knee pain.  EXAM: LEFT KNEE - COMPLETE 4+ VIEW  COMPARISON:  None.  FINDINGS: Degenerative changes in the left knee with medial greater than lateral compartment narrowing and associated osteophytes mostly on the medial compartment. Calcification in the joint suggesting chondrocalcinosis. No significant effusion. No acute fracture or dislocation.  IMPRESSION: Degenerative changes in the left knee mostly involving the  medial compartment. No acute bony abnormalities.   Electronically Signed   By: Kayla Thompson M.D.   On: 08/02/2014 05:09   Ct Maxillofacial Wo Cm  08/02/2014   CLINICAL DATA:  Syncopal episode.  Fell in [employer].  EXAM: CT HEAD WITHOUT CONTRAST  CT MAXILLOFACIAL WITHOUT CONTRAST  CT CERVICAL SPINE WITHOUT CONTRAST  TECHNIQUE: Multidetector CT imaging of the head, cervical spine, and maxillofacial structures were performed using the standard protocol without intravenous contrast. Multiplanar CT image reconstructions of the cervical spine and maxillofacial structures were also generated.  COMPARISON:  None.  FINDINGS: CT HEAD FINDINGS  There is no intracranial hemorrhage, mass or evidence of acute infarction. There is moderate atrophy and periventricular hypodensity consistent with chronic small vessel disease. There is no extra-axial fluid collection. Brainstem and posterior fossa appear intact. No bony abnormalities are evident.  CT MAXILLOFACIAL FINDINGS  There are mildly depressed nasal bone fractures, worse on the right. Orbits are intact. Orbital floors are intact. Zygomatic arches and pterygoid plates are intact. Mandible is intact.  CT CERVICAL SPINE FINDINGS  The vertebral column, pedicles and facet articulations are intact. There is no evidence of acute fracture. No acute soft tissue abnormalities are evident.  Moderate degenerative disc changes are present, greatest at C5-6 and C6-7.  IMPRESSION: 1. Negative for acute intracranial traumatic injury. 2. Moderate generalized atrophy and chronic small vessel ischemic disease. No acute intracranial findings. 3. Mildly depressed nasal bone fractures. 4. Negative for acute cervical spine fracture.   Electronically Signed   By: Kayla Thompson M.D.   On: 08/02/2014 02:26    Microbiology: No  results found for this or any previous visit (from the past 240 hour(s)).   Labs: Basic Metabolic Panel:  Recent Labs Lab 08/02/14 0147 08/02/14 0810  NA 138  138  K 4.3 3.6  CL 103 103  CO2  --  25  GLUCOSE 201* 130*  BUN 22* 16  CREATININE 0.90 0.79  CALCIUM  --  8.0*   Liver Function Tests:  Recent Labs Lab 08/02/14 0810  AST 15  ALT 14  ALKPHOS 47  BILITOT 0.6  PROT 6.1*  ALBUMIN 3.2*   No results for input(s): LIPASE, AMYLASE in the last 168 hours. No results for input(s): AMMONIA in the last 168 hours. CBC:  Recent Labs Lab 08/02/14 0140 08/02/14 0147 08/02/14 0810  WBC 15.9*  --  14.8*  NEUTROABS 8.6*  --  6.6  HGB 11.7* 13.3 10.1*  HCT 37.5 39.0 32.4*  MCV 92.8  --  92.0  PLT 297  --  307   Cardiac Enzymes: No results for input(s): CKTOTAL, CKMB, CKMBINDEX, TROPONINI in the last 168 hours. BNP: BNP (last 3 results) No results for input(s): BNP in the last 8760 hours.  ProBNP (last 3 results) No results for input(s): PROBNP in the last 8760 hours.  CBG:  Recent Labs Lab 08/02/14 1405 08/02/14 1654 08/03/14 0742  GLUCAP 161* 148* 97

## 2014-08-07 DIAGNOSIS — S52502A Unspecified fracture of the lower end of left radius, initial encounter for closed fracture: Secondary | ICD-10-CM | POA: Diagnosis not present

## 2014-08-16 DIAGNOSIS — S52502D Unspecified fracture of the lower end of left radius, subsequent encounter for closed fracture with routine healing: Secondary | ICD-10-CM | POA: Diagnosis not present

## 2014-08-23 ENCOUNTER — Other Ambulatory Visit (HOSPITAL_COMMUNITY)
Admission: RE | Admit: 2014-08-23 | Discharge: 2014-08-23 | Disposition: A | Discharge status: Home / Self Care | Payer: Medicare Other | Source: Ambulatory Visit | Attending: Gynecology | Admitting: Gynecology

## 2014-08-23 ENCOUNTER — Encounter: Payer: Self-pay | Admitting: Gynecology

## 2014-08-23 ENCOUNTER — Ambulatory Visit (INDEPENDENT_AMBULATORY_CARE_PROVIDER_SITE_OTHER): Payer: Medicare Other | Admitting: Gynecology

## 2014-08-23 VITALS — BP 122/78

## 2014-08-23 DIAGNOSIS — Z01419 Encounter for gynecological examination (general) (routine) without abnormal findings: Secondary | ICD-10-CM

## 2014-08-23 DIAGNOSIS — R87619 Unspecified abnormal cytological findings in specimens from cervix uteri: Secondary | ICD-10-CM | POA: Diagnosis not present

## 2014-08-23 DIAGNOSIS — M810 Age-related osteoporosis without current pathological fracture: Secondary | ICD-10-CM

## 2014-08-23 DIAGNOSIS — Z124 Encounter for screening for malignant neoplasm of cervix: Secondary | ICD-10-CM | POA: Diagnosis not present

## 2014-08-23 DIAGNOSIS — Z1151 Encounter for screening for human papillomavirus (HPV): Secondary | ICD-10-CM | POA: Insufficient documentation

## 2014-08-23 DIAGNOSIS — Z8543 Personal history of malignant neoplasm of ovary: Secondary | ICD-10-CM | POA: Diagnosis not present

## 2014-08-23 DIAGNOSIS — Z01411 Encounter for gynecological examination (general) (routine) with abnormal findings: Secondary | ICD-10-CM | POA: Insufficient documentation

## 2014-08-23 NOTE — Progress Notes (Signed)
Kayla Thompson 05/09/1935 [id number]   History:    79 y.o. presented to the office today as a new patient to the practice for gynecological exam. Patient recently moved to [facility] after living in [place]. She has not established with a primary care physician year. Patient recently had been hospitalized after loss of consciousness at following and had a left forearm fracture. Patient's past medical history significant for the following:  Hypertension Diabetes type 2 Asthma Past history of ovarian cancer patient stated was a stage I and she had a radical hysterectomy as well as chemotherapy in 1996 in [place] Fibromyalgia Osteoporosis  Prior surgery consisting of radical hysterectomy, hip surgery and knee surgery. Patient also stated she has had history of hypothyroidism as well in the past currently on Synthroid 50 g daily.  Patient stated she was diagnosed with osteoporosis and was on Forteo for 2 years and this year she had her last bone density study done in [place] for which we do not have the result. She is now on probably a and her last injection was one month ago.  Patient stated she had a colonoscopy a year and half ago benign polyps were removed. She also   Past medical history,surgical history, family history and social history were all reviewed and documented in the EPIC chart.  Gynecologic History No LMP recorded. Patient has had a hysterectomy. Contraception: status post hysterectomy Last Pap: 2015. Results were: normal Last mammogram: Several years ago. Results were: normal  Obstetric History OB History  Gravida Para Term Preterm AB SAB TAB Ectopic Multiple Living  1 1        1     # Outcome Date GA Lbr Len/2nd Weight Sex Delivery Anes PTL Lv  1 Para     F Vag-Spont          ROS: A ROS was performed and pertinent positives and negatives are included in the history.  GENERAL: No fevers or chills. HEENT: No change in vision, no earache, sore  throat or sinus congestion. NECK: No pain or stiffness. CARDIOVASCULAR: No chest pain or pressure. No palpitations. PULMONARY: No shortness of breath, cough or wheeze. GASTROINTESTINAL: No abdominal pain, nausea, vomiting or diarrhea, melena or bright red blood per rectum. GENITOURINARY: No urinary frequency, urgency, hesitancy or dysuria. MUSCULOSKELETAL: No joint or muscle pain, no back pain, no recent trauma. DERMATOLOGIC: No rash, no itching, no lesions. ENDOCRINE: No polyuria, polydipsia, no heat or cold intolerance. No recent change in weight. HEMATOLOGICAL: No anemia or easy bruising or bleeding. NEUROLOGIC: No headache, seizures, numbness, tingling or weakness. PSYCHIATRIC: No depression, no loss of interest in normal activity or change in sleep pattern.     Exam: chaperone present  BP 122/78 mmHg  There is no weight on file to calculate BMI.  General appearance : Well developed well nourished female. No acute distress HEENT: Eyes: no retinal hemorrhage or exudates,  Neck supple, trachea midline, no carotid bruits, no thyroidmegaly Lungs: Clear to auscultation, no rhonchi or wheezes, or rib retractions  Heart: Regular rate and rhythm, no murmurs or gallops Breast:Examined in sitting and supine position were symmetrical in appearance, no palpable masses or tenderness,  no skin retraction, no nipple inversion, no nipple discharge, no skin discoloration, no axillary or supraclavicular lymphadenopathy Abdomen: no palpable masses or tenderness, no rebound or guarding Extremities: no edema or skin discoloration or tenderness  Pelvic:  Bartholin, Urethra, Skene Glands: Within normal limits  Vagina: No gross lesions or discharge  Cervix: Absent  Uterus  absent  Adnexa  Without masses or tenderness  Anus and perineum  normal   Rectovaginal  normal sphincter tone without palpated masses or tenderness             Hemoccult PCP will provide     Assessment/Plan:  79 y.o. female  for annual exam with past history of ovarian cancer according to patient it was a stage I and she had a radical hysterectomy and had received chemotherapy in [place] with no evidence of recurrent disease. She stated that they were checking her CA 125 yearly her GYN oncologist so we will check that today. He comes of her osteoporosis history we will check a calcium, vitamin D, as well as PTH. She was reminded of the importance of calcium and vitamin D and regular exercise. She is going to be referred to my colleagues at the [facility] to get established. Pap smear was done today.    Terrance Mass MD, 4:11 PM 08/23/2014

## 2014-08-24 LAB — VITAMIN D 25 HYDROXY (VIT D DEFICIENCY, FRACTURES): Vit D, 25-Hydroxy: 48 ng/mL (ref 30–100)

## 2014-08-24 LAB — CA 125: CA 125: 9 U/mL (ref <35)

## 2014-08-26 ENCOUNTER — Other Ambulatory Visit: Payer: Self-pay | Admitting: Gynecology

## 2014-08-26 DIAGNOSIS — E213 Hyperparathyroidism, unspecified: Secondary | ICD-10-CM

## 2014-08-26 LAB — PTH, INTACT AND CALCIUM
Calcium: 9.2 mg/dL (ref 8.4–10.5)
PTH: 243 pg/mL — ABNORMAL HIGH (ref 14–64)

## 2014-08-27 LAB — CYTOLOGY - PAP

## 2014-08-28 ENCOUNTER — Other Ambulatory Visit: Payer: Self-pay

## 2014-08-28 DIAGNOSIS — Z1231 Encounter for screening mammogram for malignant neoplasm of breast: Secondary | ICD-10-CM

## 2014-08-30 ENCOUNTER — Other Ambulatory Visit: Payer: Self-pay | Admitting: Gynecology

## 2014-08-30 MED ORDER — FLUCONAZOLE 150 MG PO TABS: 150.0000 mg | ORAL_TABLET | Freq: Once | ORAL | Status: DC

## 2014-09-10 ENCOUNTER — Other Ambulatory Visit: Payer: Medicare Other

## 2014-09-10 DIAGNOSIS — E213 Hyperparathyroidism, unspecified: Secondary | ICD-10-CM | POA: Diagnosis not present

## 2014-09-11 ENCOUNTER — Telehealth: Payer: Self-pay | Admitting: *Deleted

## 2014-09-11 DIAGNOSIS — E213 Hyperparathyroidism, unspecified: Secondary | ICD-10-CM

## 2014-09-11 LAB — PARATHYROID HORMONE, INTACT (NO CA): PTH: 190 pg/mL — ABNORMAL HIGH (ref 14–64)

## 2014-09-11 NOTE — Telephone Encounter (Signed)
Referral placed they will contact pt to schedule. 

## 2014-09-11 NOTE — Telephone Encounter (Signed)
-----   Message from Ramond Craver, [place] sent at 09/11/2014  4:54 PM EDT ----- Regarding: referral to Dr. Cruzita Lederer Notes Recorded by Terrance Mass, MD on 09/11/2014 at 2:31 PM Please inform patient that I would like to refer her to the medical endocrinologist [facility] as a result of her persistently elevated PTH (parathyroid hormone) level. This hormone is responsible for calcium metabolism. Her calcium and vitamin D level were normal. The endocrinologist needs further evaluation with a possible thyroid scan to further evaluate this Hyperparathyroidism

## 2014-09-13 DIAGNOSIS — S52502D Unspecified fracture of the lower end of left radius, subsequent encounter for closed fracture with routine healing: Secondary | ICD-10-CM | POA: Diagnosis not present

## 2014-09-16 NOTE — Telephone Encounter (Signed)
Appointment 10/28/14 @ 1:15pm

## 2014-09-17 ENCOUNTER — Encounter: Payer: Self-pay | Admitting: Internal Medicine

## 2014-09-17 ENCOUNTER — Ambulatory Visit (INDEPENDENT_AMBULATORY_CARE_PROVIDER_SITE_OTHER): Payer: Medicare Other | Admitting: Internal Medicine

## 2014-09-17 VITALS — BP 136/60 | HR 84 | Temp 98.5°F | Resp 14 | Ht 59.0 in | Wt 152.2 lb

## 2014-09-17 DIAGNOSIS — M797 Fibromyalgia: Secondary | ICD-10-CM | POA: Diagnosis not present

## 2014-09-17 DIAGNOSIS — E119 Type 2 diabetes mellitus without complications: Secondary | ICD-10-CM

## 2014-09-17 MED ORDER — PREGABALIN 50 MG PO CAPS: 50.0000 mg | ORAL_CAPSULE | Freq: Two times a day (BID) | ORAL | Status: DC

## 2014-09-17 MED ORDER — OXYCODONE-ACETAMINOPHEN 5-325 MG PO TABS: 1.0000 | ORAL_TABLET | Freq: Two times a day (BID) | ORAL | Status: DC | PRN

## 2014-09-17 MED ORDER — LEVOTHYROXINE SODIUM 50 MCG PO TABS: 50.0000 ug | ORAL_TABLET | Freq: Every day | ORAL | Status: DC

## 2014-09-17 NOTE — Patient Instructions (Signed)
We will try you on lyrica for the pain. Take 1 pill in the evening the first week. If you are having no problems you can increase to taking 1 pill twice a day after that. We may need to change the dose so call us in 2-3 weeks and let us know how you are doing.   Come back in about 3 months for a check in.   Think about going to a gym with a pool and doing some water aerobics classes to help with the fibromyalgia.

## 2014-09-17 NOTE — Progress Notes (Signed)
Pre visit review using our clinic review tool, if applicable. No additional management support is needed unless otherwise documented below in the visit note. 

## 2014-09-21 DIAGNOSIS — M797 Fibromyalgia: Secondary | ICD-10-CM | POA: Insufficient documentation

## 2014-09-21 NOTE — Assessment & Plan Note (Signed)
In fairly severe exacerbation. Given her fragility will start lyrica with low dose 50 mg BID and she will call back in 1-2 weeks and we can go up on dose. Continue cymbalta. Talked to her about starting with water exercise or water aerobics for the fibromyalgia as she needs exercise to help with pain.

## 2014-09-21 NOTE — Progress Notes (Signed)
   Subjective:    Patient ID: Kayla Thompson, female    DOB: 12-23-1935, 79 y.o.   MRN: [id number]  HPI The patient is a 79 YO female who is coming in new for fibromyalgia. She has been having a lot of problems with this for years. She is on cymbalta for it now which helps some. She is not exercising as she is in too much pain. It wanders around and affects different places. She has broken her wrist in a fall earlier this year and that worsened her fibromyalgia. She has complicated medical history but states that her other problems are fairly well controlled for now.   PMH, [facility], social history reviewed and updated.   Review of Systems  Constitutional: Positive for activity change. Negative for fever, appetite change, fatigue and unexpected weight change.  HENT: Negative.   Eyes: Negative.   Respiratory: Negative for cough, chest tightness, shortness of breath and wheezing.   Cardiovascular: Negative for chest pain, palpitations and leg swelling.  Gastrointestinal: Negative for abdominal pain, diarrhea, constipation and abdominal distention.  Musculoskeletal: Positive for myalgias, back pain and arthralgias.  Skin: Negative.   Neurological: Negative.   Psychiatric/Behavioral: Negative for dysphoric mood and decreased concentration.     Objective:   Physical Exam  Constitutional: She is oriented to person, place, and time. She appears well-developed and well-nourished.  Overweight  HENT:  Head: Normocephalic and atraumatic.  Eyes: EOM are normal.  Neck: Normal range of motion.  Cardiovascular: Normal rate and regular rhythm.   Pulmonary/Chest: Effort normal and breath sounds normal.  Abdominal: Soft. Bowel sounds are normal. She exhibits no distension. There is no tenderness. There is no rebound.  Musculoskeletal: She exhibits tenderness.  Brace on wrist  Neurological: She is alert and oriented to person, place, and time. Coordination normal.  Skin: Skin is warm and dry.    Psychiatric: She has a normal mood and affect.   Filed Vitals:   09/17/14 1451  BP: 136/60  Pulse: 84  Temp: 98.5 F (36.9 C)  TempSrc: Oral  Resp: 14  Height: 4\' 11"  (1.499 m)  Weight: 152 lb 3.2 oz (69.037 kg)  SpO2: 91%      Assessment & Plan:

## 2014-09-21 NOTE — Assessment & Plan Note (Signed)
Takes novolog and lantus with meals and seeing endocrinology. Not complicated that she knows of but will obtain records to search for complication. Not on ACE-I or ARB and unclear why. Will address once we get records. No hypoglycemia.

## 2014-09-25 ENCOUNTER — Ambulatory Visit: Payer: Medicare Other

## 2014-10-02 ENCOUNTER — Telehealth: Payer: Self-pay | Admitting: Internal Medicine

## 2014-10-02 NOTE — Telephone Encounter (Signed)
Patient was prescribed pregabalin (LYRICA) 50 MG capsule [[id number] and was told to call back if it wasn't working. It is not working and would like you to up the dose. [facility] on [address]

## 2014-10-03 NOTE — Telephone Encounter (Signed)
Informed pt per Dr. Doug Sou instructions, pt said she will do as advised.

## 2014-10-03 NOTE — Telephone Encounter (Signed)
Please advise her that she can double the dose and take 2 pills in the morning and 2 pills in the evening. When out we can send in the higher dose.

## 2014-10-08 DIAGNOSIS — M47816 Spondylosis without myelopathy or radiculopathy, lumbar region: Secondary | ICD-10-CM | POA: Diagnosis not present

## 2014-10-08 DIAGNOSIS — M1712 Unilateral primary osteoarthritis, left knee: Secondary | ICD-10-CM | POA: Diagnosis not present

## 2014-10-14 DIAGNOSIS — F331 Major depressive disorder, recurrent, moderate: Secondary | ICD-10-CM | POA: Diagnosis not present

## 2014-10-14 DIAGNOSIS — F411 Generalized anxiety disorder: Secondary | ICD-10-CM | POA: Diagnosis not present

## 2014-10-15 ENCOUNTER — Telehealth: Payer: Self-pay | Admitting: Internal Medicine

## 2014-10-15 ENCOUNTER — Other Ambulatory Visit: Payer: Self-pay | Admitting: Geriatric Medicine

## 2014-10-15 MED ORDER — FLUTICASONE PROPIONATE 50 MCG/ACT NA SUSP
1.0000 | Freq: Every day | NASAL | Status: DC | PRN
Start: 2014-10-15 — End: 2015-02-19

## 2014-10-15 MED ORDER — PREGABALIN 100 MG PO CAPS: 100.0000 mg | ORAL_CAPSULE | Freq: Two times a day (BID) | ORAL | Status: DC

## 2014-10-15 NOTE — Telephone Encounter (Signed)
Pt called in and wanted refills on:  Lyrica -200 mg twice a day  Fluticasone propionate -  She had broke her nose and Hospital gave her this to help. She is out of it

## 2014-10-15 NOTE — Telephone Encounter (Signed)
She should be taking 100 mg BID of lyrica and that refill approved please fax. Flonase sent in as well.

## 2014-10-16 ENCOUNTER — Telehealth: Payer: Self-pay | Admitting: Internal Medicine

## 2014-10-16 NOTE — Telephone Encounter (Signed)
Patient states that script for lyrica was not called in correctly.  States needs lyrica needs to be above 100mg  a day.  Patient would like pharmacy contacted at [facility] in [facility].

## 2014-10-16 NOTE — Telephone Encounter (Signed)
I spoke with patient and she says she takes lyrica 100 mg twice daily. She says her fibromyalgia is really bothering her and she would like to increase the dosage. Please advise if increase is allowed, thanks.

## 2014-10-17 NOTE — Telephone Encounter (Signed)
Pt called back in about this and also needs a referral to pulmonary because her chest has been hurting   Best number [phone]

## 2014-10-17 NOTE — Telephone Encounter (Signed)
She needs visit to discuss her chest symptoms. Can address the lyrica at the same time.

## 2014-10-17 NOTE — Telephone Encounter (Signed)
Left message advising patient to call back in to schedule appt with dr Doug Sou

## 2014-10-17 NOTE — Telephone Encounter (Signed)
Please advise on Lyrica increase and on a pulmonary referral, thanks.

## 2014-10-25 DIAGNOSIS — S52502D Unspecified fracture of the lower end of left radius, subsequent encounter for closed fracture with routine healing: Secondary | ICD-10-CM | POA: Diagnosis not present

## 2014-10-25 DIAGNOSIS — M1712 Unilateral primary osteoarthritis, left knee: Secondary | ICD-10-CM | POA: Diagnosis not present

## 2014-10-28 ENCOUNTER — Encounter: Payer: Self-pay | Admitting: Internal Medicine

## 2014-10-28 ENCOUNTER — Ambulatory Visit (INDEPENDENT_AMBULATORY_CARE_PROVIDER_SITE_OTHER): Payer: Medicare Other | Admitting: Internal Medicine

## 2014-10-28 VITALS — BP 122/68 | HR 73 | Temp 98.6°F | Resp 12 | Ht 60.0 in | Wt 149.4 lb

## 2014-10-28 DIAGNOSIS — E1165 Type 2 diabetes mellitus with hyperglycemia: Secondary | ICD-10-CM | POA: Diagnosis not present

## 2014-10-28 DIAGNOSIS — E213 Hyperparathyroidism, unspecified: Secondary | ICD-10-CM

## 2014-10-28 LAB — HEMOGLOBIN A1C: Hgb A1c MFr Bld: 6.2 % (ref 4.6–6.5)

## 2014-10-28 MED ORDER — INSULIN ASPART 100 UNIT/ML ~~LOC~~ SOLN: 4.0000 [IU] | Freq: Two times a day (BID) | SUBCUTANEOUS | Status: DC

## 2014-10-28 MED ORDER — INSULIN GLARGINE 100 UNIT/ML ~~LOC~~ SOLN: 30.0000 [IU] | Freq: Every morning | SUBCUTANEOUS | Status: DC

## 2014-10-28 MED ORDER — INSULIN PEN NEEDLE 32G X 4 MM MISC: Status: DC

## 2014-10-28 NOTE — Patient Instructions (Signed)
Please stop at the lab.  Please continue current insulin regimen for now: - Lantus 30 units in am - NovoLog 4-6 units 2x a day before meals  Check sugars 2-3x a day, usually before meals and at bedtime.  Please call and schedule an eye appt with Dr. Prudencio Burly: [facility]:  Dr. Sherlyn Lick MD ?  Address: [address], [place], [place] [address]  [phone]  Please return in 3 months with your sugar log.   PATIENT INSTRUCTIONS FOR TYPE 2 DIABETES:  **Please join MyChart!** - see attached instructions about how to join if you have not done so already.  DIET AND EXERCISE Diet and exercise is an important part of diabetic treatment.  We recommended aerobic exercise in the form of brisk walking (working between 40-60% of maximal aerobic capacity, similar to brisk walking) for 150 minutes per week (such as 30 minutes five days per week) along with 3 times per week performing 'resistance' training (using various gauge rubber tubes with handles) 5-10 exercises involving the major muscle groups (upper body, lower body and core) performing 10-15 repetitions (or near fatigue) each exercise. Start at half the above goal but build slowly to reach the above goals. If limited by weight, joint pain, or disability, we recommend daily walking in a swimming pool with water up to waist to reduce pressure from joints while allow for adequate exercise.    BLOOD GLUCOSES Monitoring your blood glucoses is important for continued management of your diabetes. Please check your blood glucoses 2-4 times a day: fasting, before meals and at bedtime (you can rotate these measurements - e.g. one day check before the 3 meals, the next day check before 2 of the meals and before bedtime, etc.).   HYPOGLYCEMIA (low blood sugar) Hypoglycemia is usually a reaction to not eating, exercising, or taking too much insulin/ other diabetes drugs.  Symptoms include tremors, sweating, hunger, confusion,  headache, etc. Treat IMMEDIATELY with 15 grams of Carbs: . 4 glucose tablets .  cup regular juice/soda . 2 tablespoons raisins . 4 teaspoons sugar . 1 tablespoon honey Recheck blood glucose in 15 mins and repeat above if still symptomatic/blood glucose <100.  RECOMMENDATIONS TO REDUCE YOUR RISK OF DIABETIC COMPLICATIONS: * Take your prescribed MEDICATION(S) * Follow a DIABETIC diet: Complex carbs, fiber rich foods, (monounsaturated and polyunsaturated) fats * AVOID saturated/trans fats, high fat foods, >2,300 mg salt per day. * EXERCISE at least 5 times a week for 30 minutes or preferably daily.  * DO NOT SMOKE OR DRINK more than 1 drink a day. * Check your FEET every day. Do not wear tightfitting shoes. Contact us if you develop an ulcer * See your EYE doctor once a year or more if needed * Get a FLU shot once a year * Get a PNEUMONIA vaccine once before and once after age 76 years  GOALS:  * Your Hemoglobin A1c of <7%  * fasting sugars need to be <130 * after meals sugars need to be <180 (2h after you start eating) * Your Systolic BP should be 854 or lower  * Your Diastolic BP should be 80 or lower  * Your HDL (Good Cholesterol) should be 40 or higher  * Your LDL (Bad Cholesterol) should be 100 or lower. * Your Triglycerides should be 150 or lower  * Your Urine microalbumin (kidney function) should be <30 * Your Body Mass Index should be 25 or lower    Please consider the following ways  to cut down carbs and fat and increase fiber and micronutrients in your diet: - substitute whole grain for white bread or pasta - substitute brown rice for white rice - substitute 90-calorie flat bread pieces for slices of bread when possible - substitute sweet potatoes or yams for white potatoes - substitute humus for margarine - substitute tofu for cheese when possible - substitute almond or rice milk for regular milk (would not drink soy milk daily due to concern for soy estrogen  influence on breast cancer risk) - substitute dark chocolate for other sweets when possible - substitute water - can add lemon or orange slices for taste - for diet sodas (artificial sweeteners will trick your body that you can eat sweets without getting calories and will lead you to overeating and weight gain in the long run) - do not skip breakfast or other meals (this will slow down the metabolism and will result in more weight gain over time)  - can try smoothies made from fruit and almond/rice milk in am instead of regular breakfast - can also try old-fashioned (not instant) oatmeal made with almond/rice milk in am - order the dressing on the side when eating salad at a restaurant (pour less than half of the dressing on the salad) - eat as little meat as possible - can try juicing, but should not forget that juicing will get rid of the fiber, so would alternate with eating raw veg./fruits or drinking smoothies - use as little oil as possible, even when using olive oil - can dress a salad with a mix of balsamic vinegar and lemon juice, for e.g. - use agave nectar, stevia sugar, or regular sugar rather than artificial sweateners - steam or broil/roast veggies  - snack on veggies/fruit/nuts (unsalted, preferably) when possible, rather than processed foods - reduce or eliminate aspartame in diet (it is in diet sodas, chewing gum, etc) Read the labels!  Try to read Dr. Janene Harvey book: "Program for Reversing Diabetes" for other ideas for healthy eating.

## 2014-10-28 NOTE — Progress Notes (Signed)
Patient ID: Kayla Thompson, female   DOB: July 16, 1935, 79 y.o.   MRN: [id number]   HPI  Kayla Thompson is a 79 y.o.-year-old female, referred by her OB/GYN doctor, Dr. Uvaldo Rising, for evaluation for hyperparathyroidism + normo/hypo-calcemia. She tells me she would also want me to address her DM2. She just moved to [place] from [place].   Pt was dx with hyperparathyroidism in 08/2014. Of note, I reviewed pt's pertinent labs: Lab Results  Component Value Date   PTH 190* 09/10/2014   PTH 243* 08/23/2014   CALCIUM 9.2 08/23/2014   CALCIUM 8.0* 08/02/2014   Of note, patient has osteoporosis, dx in 2007.  I reviewed pt's DEXA scans - last from 07/25/2014: Date L1-L2 (L3 and L4 excluded) T score FN T score  07/25/2014   -0.1  LFN: -2.7   She had multiple fractures: - 09/23/1990: pelvic fx - 02/2005: R hip fx - rod - 05/2006: rib fx - 12/2008: 3 R ankle fx - in [place] - plate and screws - 70/0174: wrist fx's 01/2012: steroid inj's R knee - 07/2014: forearm fx and nasal bone fx  She started Prolia 3 or 4 years ago - last inj 07/2014.  No h/o kidney stones.  No h/o CKD. Last BUN/Cr: Lab Results  Component Value Date   BUN 16 08/02/2014   CREATININE 0.79 08/02/2014   Pt is not on HCTZ.  No h/o vitamin D deficiency. Last vit D level was 48 in 08/23/2014.  Pt is on calcium 600 mg 2x a day and vitamin D 4000 IU daily; she also eats dairy and green, leafy, vegetables - but not lately during the move.  Had R knee replacement.  Pt does not have a FH of hypercalcemia, pituitary tumors, thyroid cancer, or osteoporosis.   She also has DM2: She was dx in 2010. She started insulin in 2010.  No HbA1c available.  She is on: - Lantus 30 units in am - Novolog 4-6 units 2x a day  She brings a log: - am: 76-122, 157  Last dilated eye exam: 06/2014 (in [place]) >> no DR.  FH of DM2 in paternal aunt.   I reviewed her chart and she also has a history of Ovarian cancer - 1996, urinary  incontinence, HTN, HL, hypothyroidism: Lab Results  Component Value Date   TSH 0.591 08/02/2014    ROS: Constitutional: no weight gain/loss, + fatigue, + hot flushes, + poor sleep, + frequent urination and nocturia Eyes: + blurry vision, no xerophthalmia ENT: no sore throat, no nodules palpated in throat, no dysphagia/odynophagia, no hoarseness Cardiovascular: no CP/SOB/palpitations/leg swelling Respiratory: + cough/+ SOB Gastrointestinal: no N/V/D/+ C, + acid reflux Musculoskeletal: + both: muscle/joint aches Skin: no rashes, + easy bruising, + itching Neurological: no tremors/numbness/tingling/dizziness, + HA Psychiatric: no depression/anxiety  Past Medical History  Diagnosis Date  . Hypertension   . Diabetes mellitus without complication   . Asthma   . Ovarian cancer   . Fibromyalgia   . Osteoporosis   . Arthritis   . GERD (gastroesophageal reflux disease)   . History of chemotherapy   . History of fractured pelvis   . Osteoporosis    Past Surgical History  Procedure Laterality Date  . Hip surgey    . Knee surgey    . Lung surgery    . Bladder suspension    . Ankle fracture surgery      plate and screws  . Abdominal hysterectomy  1996   History   Social History  .  Marital Status: Widowed    Spouse Name: N/A  . Number of Children: 2 (1 adopted)   Occupational History  . retired   Social History Main Topics  . Smoking status: Never Smoker   . Smokeless tobacco: Never Used  . Alcohol Use: No  . Drug Use: No   Current Outpatient Prescriptions on File Prior to Visit  Medication Sig Dispense Refill  . albuterol (PROVENTIL) (2.5 MG/3ML) 0.083% nebulizer solution Take 2.5 mg by nebulization daily.    . carvedilol (COREG) 6.25 MG tablet Take 6.25 mg by mouth 2 (two) times daily with a meal.    . denosumab (PROLIA) 60 MG/ML SOLN injection Inject 60 mg into the skin every 6 (six) months. Administer in upper arm, thigh, or abdomen    .  Dextromethorphan-Guaifenesin (MUCINEX DM MAXIMUM STRENGTH) 60-1200 MG TB12 Take 1 tablet by mouth 2 (two) times daily as needed (for cough/phlegm).    . DULoxetine (CYMBALTA) 60 MG capsule Take 60 mg by mouth daily.    . fluticasone (FLONASE) 50 MCG/ACT nasal spray Place 1-2 sprays into both nostrils daily as needed for allergies or rhinitis. 16 g 6  . Fluticasone-Salmeterol (ADVAIR) 500-50 MCG/DOSE AEPB Inhale 1 puff into the lungs 2 (two) times daily.    . insulin aspart (NOVOLOG) 100 UNIT/ML injection Inject 4 Units into the skin 2 (two) times daily before a meal.    . insulin glargine (LANTUS) 100 UNIT/ML injection Inject 30 Units into the skin every morning.    . lansoprazole (PREVACID) 30 MG capsule Take 30 mg by mouth 2 (two) times daily.     Marland Kitchen levothyroxine (SYNTHROID, LEVOTHROID) 50 MCG tablet Take 1 tablet (50 mcg total) by mouth daily before breakfast. 30 tablet 3  . montelukast (SINGULAIR) 10 MG tablet Take 10 mg by mouth daily.     . NON FORMULARY 1 Syringe by Epidural route every 3 (three) months. Epidural Steroid Injections for Lumbar Spinal Stenosis up to 4 times a year    . predniSONE (DELTASONE) 1 MG tablet Take 3 mg by mouth daily with breakfast.    . pregabalin (LYRICA) 100 MG capsule Take 1 capsule (100 mg total) by mouth 2 (two) times daily. 60 capsule 2  . ranitidine (ZANTAC) 150 MG tablet Take 150 mg by mouth 2 (two) times daily as needed for heartburn.    . rosuvastatin (CRESTOR) 10 MG tablet Take 10 mg by mouth every evening.     . tiotropium (SPIRIVA) 18 MCG inhalation capsule Place 18 mcg into inhaler and inhale daily.    Marland Kitchen zolpidem (AMBIEN) 10 MG tablet Take 10 mg by mouth at bedtime.    Marland Kitchen oxyCODONE-acetaminophen (PERCOCET/ROXICET) 5-325 MG per tablet Take 1 tablet by mouth 2 (two) times daily as needed for severe pain. (Patient not taking: Reported on 10/28/2014) 45 tablet 0   No current facility-administered medications on file prior to visit.   Allergies  Allergen  Reactions  . Latex Rash  . Penicillins Rash   Family History  Problem Relation Age of Onset  . Lung cancer Mother   . CAD Father   . Diabetes Father   . Lung cancer Maternal Aunt   . Diabetes Paternal Aunt   . Diabetes Paternal Uncle   . Diabetes Paternal Grandmother   . Diabetes Paternal Grandfather    PE: BP 122/68 mmHg  Pulse 73  Temp(Src) 98.6 F (37 C) (Oral)  Resp 12  Ht 5' (1.524 m)  Wt 149 lb 6.4 oz (67.767  kg)  BMI 29.18 kg/m2  SpO2 96% Wt Readings from Last 3 Encounters:  10/28/14 149 lb 6.4 oz (67.767 kg)  09/17/14 152 lb 3.2 oz (69.037 kg)  08/02/14 152 lb 1.6 oz (68.992 kg)   Constitutional: Normal weight, in NAD. No kyphosis. Eyes: PERRLA, EOMI, no exophthalmos ENT: moist mucous membranes, no thyromegaly, no cervical lymphadenopathy Cardiovascular: RRR, No MRG Respiratory: CTA B Gastrointestinal: abdomen soft, NT, ND, BS+ Musculoskeletal: no deformities, strength intact in all 4 Skin: moist, warm, no rashes Neurological: no tremor with outstretched hands, DTR normal in all 4  Assessment: 1. Hyperparathyroidism - Hypo-/normocalcemia   2. DM2, insulin-dependent, controlled, without complications  Plan: 1. [place] - Patient has had 2 instances of a high parathyroid hormone, in the setting of initially a low calcium (and low ionized calcium) and then normal calcium. Both set of labs were drawn apparently after Prolia infusion, which can lower calcium levels. She does not have a history of hypercalcemia. She is taking calcium supplements, 600 mg 2x a day (Caltrate). Of note, patient has a normal vitamin D level, at 48, 2 months ago. - Reviewing the chart, I believe that her low calcium level was due to her Prolia injection. I would like to first repeat the PTH ([facility] asay) and calcium and, if they remain abnormal, we will pursue investigation for primary hyperparathyroidism. - I discussed with the patient about the physiology of calcium and parathyroid  hormone; possible side effects from increased PTH are kidney stones (negative history), osteoporosis (positive history, with many fractures), abdominal pain (negative history), etc.  - if the PTH is still high in the setting of a normal calcium, we'll need to check: Magnesium Phosphorus 1,25 dihydroxy vitamin D 24h urinary calcium/creatinine ratio  - possible consequences of hyperparathyroidism: ~1/3 pts will develop complications over 15 years (OP, nephrolithiasis).  - If the tests indicate a parathyroid adenoma, she may need referral to surgery - criteria for parathyroid surgery:  Increased calcium by more than 1 mg/dL above the upper limit of normal  Kidney ds.  Osteoporosis (or Vb fx) Age <25 years old New (2013): High UCa >400 mg/d and increased stone risk by biochemical stone risk analysis Presence of nephrolithiasis or nephrocalcinosis Pt's preference!  - I will see the patient back in 3 months, but will discuss with her if we need further evaluation after the above results are back   2. DM2 - Patient with long-standing, on basal bolus insulin regimen, with apparently good control, however, she does not check sugars later in the day and I do not have a recent hemoglobin A1c to review. - I advised her to  Patient Instructions  Please stop at the lab.  Please continue current insulin regimen for now: - Lantus 30 units in am - NovoLog 4-6 units 2x a day before meals  Check sugars 2-3x a day, usually before meals and at bedtime.  Please call and schedule an eye appt with Dr. Prudencio Burly: [facility]:  Dr. Sherlyn Lick MD ?  Address: [address], [place], [place] [address]  [phone]  Please return in 3 months with your sugar log.   - Strongly advised her to start checking sugars at different times of the day - check 2-3 times a day, rotating checks - advised how to fill her sugar log and to bring it at next appt  - given foot care handout and  explained the principles  - given instructions for hypoglycemia management "15-15 rule"  -  advised for yearly eye exams >> recommended Dr. Prudencio Burly - Will check a hemoglobin A1c today - Return to clinic in 3 mo with sugar log   - time spent with the patient: 1 hour, of which >50% was spent in obtaining information about her hyperparathyroidism and diabetes reviewing her previous labs, evaluations, and treatments, counseling her about her conditions (please see the discussed topics above), and developing a plan to further investigate them; she had a number of questions which I addressed.  Component     Latest Ref Rng 10/28/2014 10/28/2014         1:51 PM  1:51 PM  Calcium     8.7 - 10.3 mg/dL  8.7  PTH     15 - 65 pg/mL  73 (H)  Calcium Ionized     1.12 - 1.32 mmol/L 1.21   Hemoglobin A1C     4.6 - 6.5 % 6.2    HbA1c great! Calcium still close to the lower limit of normal. PTH slightly elevated. We'll go ahead and obtain the rest of the labs to investigate for primary hyperparathyroidism, including the urine collection. If the 24 hour urinary calcium is low, I will suggest to switch to calcium citrate, which is better absorbed in the setting of PPI use.

## 2014-10-29 ENCOUNTER — Encounter: Payer: Self-pay | Admitting: *Deleted

## 2014-10-29 LAB — CALCIUM, IONIZED: Calcium, Ion: 1.21 mmol/L (ref 1.12–1.32)

## 2014-10-29 LAB — PTH, INTACT AND CALCIUM
Calcium: 8.7 mg/dL (ref 8.7–10.3)
PTH: 73 pg/mL — ABNORMAL HIGH (ref 15–65)

## 2014-11-01 ENCOUNTER — Ambulatory Visit: Payer: Medicare Other | Admitting: Internal Medicine

## 2014-11-04 ENCOUNTER — Other Ambulatory Visit: Payer: Medicare Other

## 2014-11-06 DIAGNOSIS — H2513 Age-related nuclear cataract, bilateral: Secondary | ICD-10-CM | POA: Diagnosis not present

## 2014-11-06 DIAGNOSIS — E119 Type 2 diabetes mellitus without complications: Secondary | ICD-10-CM | POA: Diagnosis not present

## 2014-11-08 ENCOUNTER — Other Ambulatory Visit (INDEPENDENT_AMBULATORY_CARE_PROVIDER_SITE_OTHER): Payer: Medicare Other

## 2014-11-08 DIAGNOSIS — E213 Hyperparathyroidism, unspecified: Secondary | ICD-10-CM

## 2014-11-08 LAB — MAGNESIUM: Magnesium: 2.1 mg/dL (ref 1.5–2.5)

## 2014-11-08 LAB — PHOSPHORUS: Phosphorus: 3.8 mg/dL (ref 2.3–4.6)

## 2014-11-12 ENCOUNTER — Ambulatory Visit (INDEPENDENT_AMBULATORY_CARE_PROVIDER_SITE_OTHER): Payer: Medicare Other | Admitting: Internal Medicine

## 2014-11-12 ENCOUNTER — Encounter: Payer: Self-pay | Admitting: Internal Medicine

## 2014-11-12 VITALS — BP 120/80 | HR 88 | Temp 98.4°F | Resp 12 | Wt 151.8 lb

## 2014-11-12 DIAGNOSIS — J4530 Mild persistent asthma, uncomplicated: Secondary | ICD-10-CM | POA: Diagnosis not present

## 2014-11-12 DIAGNOSIS — F411 Generalized anxiety disorder: Secondary | ICD-10-CM | POA: Diagnosis not present

## 2014-11-12 DIAGNOSIS — M797 Fibromyalgia: Secondary | ICD-10-CM | POA: Diagnosis not present

## 2014-11-12 DIAGNOSIS — J45909 Unspecified asthma, uncomplicated: Secondary | ICD-10-CM | POA: Insufficient documentation

## 2014-11-12 LAB — VITAMIN D 1,25 DIHYDROXY
Vitamin D 1, 25 (OH)2 Total: 52 pg/mL (ref 18–72)
Vitamin D2 1, 25 (OH)2: <8 pg/mL
Vitamin D3 1, 25 (OH)2: 52 pg/mL

## 2014-11-12 MED ORDER — BUSPIRONE HCL 10 MG PO TABS: 10.0000 mg | ORAL_TABLET | Freq: Two times a day (BID) | ORAL | Status: DC

## 2014-11-12 MED ORDER — PREGABALIN 225 MG PO CAPS: 225.0000 mg | ORAL_CAPSULE | Freq: Two times a day (BID) | ORAL | Status: DC

## 2014-11-12 MED ORDER — OXYCODONE-ACETAMINOPHEN 5-325 MG PO TABS: 1.0000 | ORAL_TABLET | Freq: Two times a day (BID) | ORAL | Status: DC | PRN

## 2014-11-12 MED ORDER — ZOLPIDEM TARTRATE 5 MG PO TABS: 5.0000 mg | ORAL_TABLET | Freq: Every day | ORAL | Status: DC

## 2014-11-12 NOTE — Assessment & Plan Note (Signed)
Unclear if the prednisone on her list is for the breathing. She is not sure and has not had flares in some times. Pulmonary referral placed today and likely needs decrease in therapy since she is flare free for several months. She does not want to make any changes with me today and wants to wait for the lung specialist. Never intubation and rare hospitalizations. Does not limit her life and denies much albuterol usage. Currently on prednisone (possibly) and singulair and advair and spiriva for maintenance.

## 2014-11-12 NOTE — Patient Instructions (Signed)
We have put in the referral to the lung doctor so you should hear back about it.  We have given you the refill of the oxycodone, the ambien.   We have also sent in the buspar which is a medicine for anxiety. It is much easier on the system than something like xanax. Xanax can cause memory problems such as forgetting things, and balance problems. It can increase your risk for falls especially with some of your other medicines and we would like to avoid it for your health.  Stress and Stress Management Stress is a normal reaction to life events. It is what you feel when life demands more than you are used to or more than you can handle. Some stress can be useful. For example, the stress reaction can help you catch the last bus of the day, study for a test, or meet a deadline at work. But stress that occurs too often or for too long can cause problems. It can affect your emotional health and interfere with relationships and normal daily activities. Too much stress can weaken your immune system and increase your risk for physical illness. If you already have a medical problem, stress can make it worse. CAUSES  All sorts of life events may cause stress. An event that causes stress for one person may not be stressful for another person. Major life events commonly cause stress. These may be positive or negative. Examples include losing your job, moving into a new home, getting married, having a baby, or losing a loved one. Less obvious life events may also cause stress, especially if they occur day after day or in combination. Examples include working long hours, driving in traffic, caring for children, being in debt, or being in a difficult relationship. SIGNS AND SYMPTOMS Stress may cause emotional symptoms including, the following:  Anxiety. This is feeling worried, afraid, on edge, overwhelmed, or out of control.  Anger. This is feeling irritated or impatient.  Depression. This is feeling sad, down,  helpless, or guilty.  Difficulty focusing, remembering, or making decisions. Stress may cause physical symptoms, including the following:   Aches and pains. These may affect your head, neck, back, stomach, or other areas of your body.  Tight muscles or clenched jaw.  Low energy or trouble sleeping. Stress may cause unhealthy behaviors, including the following:   Eating to feel better (overeating) or skipping meals.  Sleeping too little, too much, or both.  Working too much or putting off tasks (procrastination).  Smoking, drinking alcohol, or using drugs to feel better. DIAGNOSIS  Stress is diagnosed through an assessment by your health care provider. Your health care provider will ask questions about your symptoms and any stressful life events.Your health care provider will also ask about your medical history and may order blood tests or other tests. Certain medical conditions and medicine can cause physical symptoms similar to stress. Mental illness can cause emotional symptoms and unhealthy behaviors similar to stress. Your health care provider may refer you to a mental health professional for further evaluation.  TREATMENT  Stress management is the recommended treatment for stress.The goals of stress management are reducing stressful life events and coping with stress in healthy ways.  Techniques for reducing stressful life events include the following:  Stress identification. Self-monitor for stress and identify what causes stress for you. These skills may help you to avoid some stressful events.  Time management. Set your priorities, keep a calendar of events, and learn to say "no." These  tools can help you avoid making too many commitments. Techniques for coping with stress include the following:  Rethinking the problem. Try to think realistically about stressful events rather than ignoring them or overreacting. Try to find the positives in a stressful situation rather than  focusing on the negatives.  Exercise. Physical exercise can release both physical and emotional tension. The key is to find a form of exercise you enjoy and do it regularly.  Relaxation techniques. These relax the body and mind. Examples include yoga, meditation, tai chi, biofeedback, deep breathing, progressive muscle relaxation, listening to music, being out in nature, journaling, and other hobbies. Again, the key is to find one or more that you enjoy and can do regularly.  Healthy lifestyle. Eat a balanced diet, get plenty of sleep, and do not smoke. Avoid using alcohol or drugs to relax.  Strong support network. Spend time with family, friends, or other people you enjoy being around.Express your feelings and talk things over with someone you trust. Counseling or talktherapy with a mental health professional may be helpful if you are having difficulty managing stress on your own. Medicine is typically not recommended for the treatment of stress.Talk to your health care provider if you think you need medicine for symptoms of stress. HOME CARE INSTRUCTIONS  Keep all follow-up visits as directed by your health care provider.  Take all medicines as directed by your health care provider. SEEK MEDICAL CARE IF:  Your symptoms get worse or you start having new symptoms.  You feel overwhelmed by your problems and can no longer manage them on your own. SEEK IMMEDIATE MEDICAL CARE IF:  You feel like hurting yourself or someone else. Document Released: 09/01/2000 Document Revised: 07/23/2013 Document Reviewed: 10/31/2012 [facility] Patient Information 2015 [place], [place]. This information is not intended to replace advice given to you by your health care provider. Make sure you discuss any questions you have with your health care provider.

## 2014-11-12 NOTE — Assessment & Plan Note (Signed)
Would strongly oppose starting her on xanax as she is on many sedating medications already. Okay with trying buspar and prescribed today. Talked to her about the fact that xanax dramatically increases her risk of falls, memory problems, and confusion.

## 2014-11-12 NOTE — Progress Notes (Signed)
   Subjective:    Patient ID: Kayla Thompson, female    DOB: Jul 31, 1935, 79 y.o.   MRN: [id number]  HPI The patient is a 79 YO female coming in for her asthma and her fibromyalgia and anxiety. She has been doing well with her asthma but wishes to see a pulmonary doctor as she has always had one. Denies any history of intubation and rare hospitalization for her asthma (less than 3 ever). Uses inhalers everyday which generally do well. Uses albuterol maybe 1-2 times per week on a bad week.  Her fibromyalgia is better with the lyrica and she has increased (on her own against advice) to 200 mg BID of the lyrica. She would like to increase further as she is still having pain. She uses percocet for pain when she needs it. Generally at least once a day. She recently went to see an orthopedic and he gave her tramadol but she does not take at the same time as the percocet. It does not help as well.  Her anxiety is worse as she is planning to leave for one month and her daughter will be alone (and suffers from severe depression). She has been seeing a Social worker and states that they recommended she get some xanax to help her anxiety. She thinks that the counseling is helping. No panic attacks. Taking cymbalta which helps some but not that much.   Review of Systems  Constitutional: Positive for activity change. Negative for fever, appetite change, fatigue and unexpected weight change.  Respiratory: Negative for cough, chest tightness, shortness of breath and wheezing.   Cardiovascular: Negative for chest pain, palpitations and leg swelling.  Gastrointestinal: Negative for abdominal pain, diarrhea, constipation and abdominal distention.  Musculoskeletal: Positive for myalgias, back pain and arthralgias.  Skin: Negative.   Neurological: Negative.   Psychiatric/Behavioral: Positive for dysphoric mood and decreased concentration. Negative for suicidal ideas, sleep disturbance and self-injury. The patient is  nervous/anxious.       Objective:   Physical Exam  Constitutional: She is oriented to person, place, and time. She appears well-developed and well-nourished.  Overweight  HENT:  Head: Normocephalic and atraumatic.  Eyes: EOM are normal.  Neck: Normal range of motion.  Cardiovascular: Normal rate and regular rhythm.   Pulmonary/Chest: Effort normal and breath sounds normal.  Abdominal: Soft. Bowel sounds are normal. She exhibits no distension. There is no tenderness. There is no rebound.  Musculoskeletal: She exhibits tenderness.  Neurological: She is alert and oriented to person, place, and time. Coordination normal.  Skin: Skin is warm and dry.  Psychiatric: She has a normal mood and affect.   Filed Vitals:   11/12/14 1314  BP: 120/80  Pulse: 88  Temp: 98.4 F (36.9 C)  TempSrc: Oral  Resp: 12  Weight: 151 lb 12.8 oz (68.856 kg)  SpO2: 92%      Assessment & Plan:

## 2014-11-12 NOTE — Progress Notes (Signed)
Pre visit review using our clinic review tool, if applicable. No additional management support is needed unless otherwise documented below in the visit note. 

## 2014-11-12 NOTE — Assessment & Plan Note (Signed)
Can increase lyrica to 225 mg BID. She is also on cymbalta 60 mg daily. Refill of oxycodone given today and talked to her about not mixing tramadol and oxycodone and will not prescribe both and have strongly encouraged her not to take both. She will stop the tramadol as it is not as effective.

## 2014-11-14 ENCOUNTER — Telehealth: Payer: Self-pay | Admitting: Internal Medicine

## 2014-11-14 NOTE — Telephone Encounter (Signed)
Patient states Dr. Doug Sou changed her Lorrin Mais from 10mg  to 5mg .  Patient is requesting this to be changed back to 10mg  and sent to [facility] on [address].

## 2014-11-14 NOTE — Telephone Encounter (Signed)
Unfortunately it is not recommended for anyone over 65 to take more than 5 mg nightly. Would like her to try that.

## 2014-11-15 ENCOUNTER — Encounter: Payer: Self-pay | Admitting: *Deleted

## 2014-11-15 NOTE — Telephone Encounter (Signed)
Called and informed patient. 

## 2014-12-02 DIAGNOSIS — T169XXA Foreign body in ear, unspecified ear, initial encounter: Secondary | ICD-10-CM | POA: Diagnosis not present

## 2014-12-02 DIAGNOSIS — H905 Unspecified sensorineural hearing loss: Secondary | ICD-10-CM | POA: Diagnosis not present

## 2014-12-02 DIAGNOSIS — R1310 Dysphagia, unspecified: Secondary | ICD-10-CM | POA: Diagnosis not present

## 2014-12-02 DIAGNOSIS — J343 Hypertrophy of nasal turbinates: Secondary | ICD-10-CM | POA: Diagnosis not present

## 2014-12-02 DIAGNOSIS — J302 Other seasonal allergic rhinitis: Secondary | ICD-10-CM | POA: Diagnosis not present

## 2014-12-11 DIAGNOSIS — R350 Frequency of micturition: Secondary | ICD-10-CM | POA: Diagnosis not present

## 2014-12-11 DIAGNOSIS — R319 Hematuria, unspecified: Secondary | ICD-10-CM | POA: Diagnosis not present

## 2014-12-11 DIAGNOSIS — R3914 Feeling of incomplete bladder emptying: Secondary | ICD-10-CM | POA: Diagnosis not present

## 2014-12-11 DIAGNOSIS — R8299 Other abnormal findings in urine: Secondary | ICD-10-CM | POA: Diagnosis not present

## 2014-12-12 DIAGNOSIS — N390 Urinary tract infection, site not specified: Secondary | ICD-10-CM | POA: Diagnosis not present

## 2014-12-17 DIAGNOSIS — R351 Nocturia: Secondary | ICD-10-CM | POA: Diagnosis not present

## 2014-12-17 DIAGNOSIS — N319 Neuromuscular dysfunction of bladder, unspecified: Secondary | ICD-10-CM | POA: Diagnosis not present

## 2014-12-18 ENCOUNTER — Ambulatory Visit: Payer: Medicare Other | Admitting: Internal Medicine

## 2014-12-18 DIAGNOSIS — M1712 Unilateral primary osteoarthritis, left knee: Secondary | ICD-10-CM | POA: Diagnosis not present

## 2014-12-18 DIAGNOSIS — R3914 Feeling of incomplete bladder emptying: Secondary | ICD-10-CM | POA: Diagnosis not present

## 2014-12-28 ENCOUNTER — Other Ambulatory Visit: Payer: Self-pay | Admitting: Internal Medicine

## 2015-01-07 ENCOUNTER — Encounter: Payer: Self-pay | Admitting: Pulmonary Disease

## 2015-01-07 ENCOUNTER — Telehealth: Payer: Self-pay | Admitting: Pulmonary Disease

## 2015-01-07 ENCOUNTER — Ambulatory Visit (INDEPENDENT_AMBULATORY_CARE_PROVIDER_SITE_OTHER)
Admission: RE | Admit: 2015-01-07 | Discharge: 2015-01-07 | Disposition: A | Discharge status: Home / Self Care | Payer: Medicare Other | Source: Ambulatory Visit | Attending: Pulmonary Disease | Admitting: Pulmonary Disease

## 2015-01-07 ENCOUNTER — Other Ambulatory Visit (INDEPENDENT_AMBULATORY_CARE_PROVIDER_SITE_OTHER): Payer: Medicare Other

## 2015-01-07 ENCOUNTER — Ambulatory Visit (INDEPENDENT_AMBULATORY_CARE_PROVIDER_SITE_OTHER): Payer: Medicare Other | Admitting: Pulmonary Disease

## 2015-01-07 VITALS — BP 136/64 | HR 78 | Temp 98.3°F | Ht 60.0 in | Wt 153.8 lb

## 2015-01-07 DIAGNOSIS — R0600 Dyspnea, unspecified: Secondary | ICD-10-CM

## 2015-01-07 DIAGNOSIS — R05 Cough: Secondary | ICD-10-CM

## 2015-01-07 DIAGNOSIS — J479 Bronchiectasis, uncomplicated: Secondary | ICD-10-CM

## 2015-01-07 DIAGNOSIS — M797 Fibromyalgia: Secondary | ICD-10-CM

## 2015-01-07 DIAGNOSIS — M353 Polymyalgia rheumatica: Secondary | ICD-10-CM | POA: Diagnosis not present

## 2015-01-07 DIAGNOSIS — R059 Cough, unspecified: Secondary | ICD-10-CM

## 2015-01-07 DIAGNOSIS — M159 Polyosteoarthritis, unspecified: Secondary | ICD-10-CM | POA: Insufficient documentation

## 2015-01-07 DIAGNOSIS — M8949 Other hypertrophic osteoarthropathy, multiple sites: Secondary | ICD-10-CM

## 2015-01-07 DIAGNOSIS — M810 Age-related osteoporosis without current pathological fracture: Secondary | ICD-10-CM

## 2015-01-07 DIAGNOSIS — M150 Primary generalized (osteo)arthritis: Secondary | ICD-10-CM

## 2015-01-07 DIAGNOSIS — M1990 Unspecified osteoarthritis, unspecified site: Secondary | ICD-10-CM | POA: Insufficient documentation

## 2015-01-07 LAB — BASIC METABOLIC PANEL
BUN: 11 mg/dL (ref 6–23)
CO2: 32 mEq/L (ref 19–32)
Calcium: 9.7 mg/dL (ref 8.4–10.5)
Chloride: 105 mEq/L (ref 96–112)
Creatinine, Ser: 0.92 mg/dL (ref 0.40–1.20)
GFR: 62.82 mL/min (ref >60.00)
Glucose, Bld: 45 mg/dL — CL (ref 70–99)
Potassium: 4.6 mEq/L (ref 3.5–5.1)
Sodium: 140 mEq/L (ref 135–145)

## 2015-01-07 LAB — CBC WITH DIFFERENTIAL/PLATELET
Basophils Absolute: 0.1 10*3/uL (ref 0.0–0.1)
Basophils Relative: 0.3 % (ref 0.0–3.0)
Eosinophils Absolute: 0.6 10*3/uL (ref 0.0–0.7)
Eosinophils Relative: 3.8 % (ref 0.0–5.0)
HCT: 34.5 % — ABNORMAL LOW (ref 36.0–46.0)
Hemoglobin: 11 g/dL — ABNORMAL LOW (ref 12.0–15.0)
Lymphocytes Relative: 29.3 % (ref 12.0–46.0)
Lymphs Abs: 4.8 10*3/uL — ABNORMAL HIGH (ref 0.7–4.0)
MCHC: 31.9 g/dL (ref 30.0–36.0)
MCV: 87.6 fl (ref 78.0–100.0)
Monocytes Absolute: 0.6 10*3/uL (ref 0.1–1.0)
Monocytes Relative: 3.7 % (ref 3.0–12.0)
Neutro Abs: 10.2 10*3/uL — ABNORMAL HIGH (ref 1.4–7.7)
Neutrophils Relative %: 62.9 % (ref 43.0–77.0)
Platelets: 399 10*3/uL (ref 150.0–400.0)
RBC: 3.93 Mil/uL (ref 3.87–5.11)
RDW: 15.7 % — ABNORMAL HIGH (ref 11.5–15.5)
WBC: 16.3 10*3/uL — ABNORMAL HIGH (ref 4.0–10.5)

## 2015-01-07 LAB — SEDIMENTATION RATE: Sed Rate: 52 mm/hr — ABNORMAL HIGH (ref 0–22)

## 2015-01-07 NOTE — Progress Notes (Addendum)
Subjective:     Patient ID: Kayla Thompson, female   DOB: 11/21/1935, 79 y.o.   MRN: [id number]  HPI  ~  January 07, 2015:  Initial pulmonary consult w/ SN>      70 y/o WF, referred by [name]) for a pulmonary evaluation due to cough & dyspnea>  Caydee moved to [place] about 11mo ago to be near her daughter, granddaughter, and great-grands after her husb passed away;  Shortly after arrival she fell & was [facility] w/ fx nasal bones and left wrist (distal radius & ulna) managed by Burnett Sheng summary reviewed... She established Primary Care w/ DrKollar, [place], Endocrine- DrGherghe... She has a Hx of pulmonary problems identified by the Pt & records from [facility] as Bronchiectasis and Asthma;  Her CC is SOB/ DOE w/ min activity & it is apparent that she is rather sedentary and a poor historian; she notes mild cough, some yellow sputum, no hemoptysis; she denies CP, palpit, or signif cardiac history...  Smoking Hx>  She is a never smoker, but had signif 2nd hand exposure from her husb...  Pulmonary Hx>  She indicates hx left lung surg 2000 for benign granuloma; hx bronchiectasis but she does not know how the Dx was established; told asthma as well ever since early 2000's but not as a child or young adult; states she had pneumonia age 47 at same time as the measles (not hosp & made full recovery w/o apparent residual resp problems); she does admit to recurrent bronchitic infections as adult requiring antibiotics & occas Pred rx... She adds a rambling hx of work-ups at [facility] (in [place] in [place] but she does not know what this was for?       Notes from Dr. 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   Family Hx>  Mother died from lung cancer (as did one of her sisters);  Father passed from heart disease.  Occup Hx>  No known occupational exposures  Current Meds>  SEE FULL MED LIST BELOW  EXAM showed Afeb, VSS, O2sat=99% on RA;  Heent- neg, mallampati3;  Chest- decr BS bilat, not congested, w/o w/r/r heard;  Heart- RR gr1/6SEM w/o r/g detected;  Abd- soft, nontender, +panniculus;  Ext- VI w/o c/c/e...   CT Chest 11/17/09 in [place]> mult noncalcif pulm nodules bilat (stable when compared to older scans), mucoid impaction LUL & RML, pleuroparenchymal scarring right base (unchanged), no adenopathy, norm heart size & coronary calcif.  PFTs 02/04/10 in [place]> FVC=2.20 (95%), FEV1=1.42 (88%), %1sec=65, mid-flows reduced at 38% predicted; no improvement in FEV1 after bronchodil; TLC=4.70 (118%), RV=2.48 (153%), RV/TLC=53%; DLCO=54% predicted.  CXR 01/14/14 reported to show norm heart size, clear lungs, thoracic spondylosis, NAD...   CXR 08/02/14 in ER here after fall showed norm heart size,  diffuse interstitial coarsening w/o focal airsp dis...   2DEcho 08/02/14 in [place] after fall showed norm LVF w/ EF=55-60%, no regional wall motion abn, Gr1 DD, mild AS w/ mean gradient 20mmHg; mod calcif MV leaflets w/o stenosis or regurg; RV looked OK but could not est PAsys...  CXR here 01/07/15 showed norm heart size, incr AP diameter of chest & flat diaphragms, incr markings diffusely, old right rib fxs, osteopenia...   Spirometry 01/07/15> we attempted mult trial of simple spirometry but pt  could not generate an acceptable curve & results are not reliable...  Ambulatory oxygen saturation test 01/07/15> O2sat=98% on RA at rest w/ pulse=79/min; she ambulated 2 laps in office & stopped w/ dyspnea, lowest O2sat=87% w/ pulse=95/min  LABS 01/07/15>  Chems- wnl x BS=45;  CBC- Hg=11.0, MCV=87, WBC=16.3, diff ok;  Sed=52  IMP/PLAN>>  Complex 79 y/o woman:    Pulmonary> Hx COPD/ Bronchiectasis, Hx granulomatous lung dis & reported atyp mycobacterium in the past, ?other ILD- awaiting Hi-res CT Chest results; she has clinical hx asthma in the past but no reversibility on prev PFTs; REC to continue Advair500, Spiriva, NEBS, Singulair, Mucinex, Ventolin HFA for now; may need further ILD work-up w/ collagen-vasc screen QuantGold, sputum studies, etc... We will follow closely.    Cardiac> on Coreg6.25Bid for ?HBP; she has mild AS & DiastCHF on 2DEcho and this needs follow up...    Endocrine> IDDM, Hypothyroid, transiently elev serum calcium ?etiology (resolved spont); managed by DrGherghe; BS today is 45 & asked to eat & check w/ endocrine re- adjustment in Novolog...    GYN> Hx ovarian cancer (in remission- details unknown), s/p hyst    Ortho/ Rheum> DJD w/ TKR, FM on Lyrica etc, severe LBP requires epidural shots; PMR on Pred (details unknown); her Sed=52 and she will prob need Rheum to help sort this out...    Heme> Hg 10-11 range May2016 after fall w/ fx nose & wrist; similar now & may need further anemia work-up as well...  ADDENDUM>>  Hi-res CT Chest 01/14/15:  Norm heart size, atherosclerosis of Ao/ great vessels/ coronaries, no adenopathy, atrophic thyroid; ?small sliding pulm hernia betw the lat 7th & 8th ribs w/ scarring; extensive bronchiectasis bilat w/ mucoid impactions within dilated airways (infectious/inflamm bronchiolitis- r/o MAI), 34mm LLL nodule, tracheobronchomalacia w/ extensive bilat air trapping on expiration, smallHH, old healed right 6th-7th rib fx, DJD Tspine...  REC> continue  NEBS, Advair, Spiriva, Mucinex (maximize to 600mg  Qid w/ fluids), practice purse lip breathing to splint the airways open & aide in expectoration of phlegm, use FLUTTER valve & postural drainage to get the sput out; collect sput for routine C&S, AFB, Fungi => TC w/ pt: she has a lot going on, refuses further meds, refuses further eval, can't be bothered w/ learning Chest PT/ Flutter, postural drainage, purse lip breathing, collecting sputum specimen, etc; states she's had all this a long time & not interested in more aggressive pulmonary treatments at this time... She will f/u w/ me in November & we will discuss again.    Past Medical History  Diagnosis Date  . Hypertension   . Diabetes mellitus without complication ([place])   . Asthma   . Ovarian cancer ([place])   . Fibromyalgia   . Osteoporosis   . Arthritis   . GERD (gastroesophageal reflux disease)   . History of chemotherapy   . History of fractured pelvis   . Osteoporosis     Past Surgical History  Procedure Laterality Date  . Hip  surgey    . Knee surgey    . Lung surgery >> resection of nodule 2000, proved to be a benign granuloma    . Bladder suspension    . Ankle fracture surgery      plate and screws  . Abdominal hysterectomy  1996    Outpatient Encounter Prescriptions as of 01/07/2015  Medication Sig  . ADVAIR DISKUS 500-50 MCG/DOSE AEPB INHALE 1 PUFF BY MOUTH TWICE DAILY  . albuterol (PROVENTIL) (2.5 MG/3ML) 0.083% nebulizer solution Take 2.5 mg by nebulization daily.  . carvedilol (COREG) 6.25 MG tablet Take 6.25 mg by mouth 2 (two) times daily with a meal.  . clindamycin (CLEOCIN) 150 MG capsule TK 4 CS PO 1 HOUR PRIOR TO APPOINTMENT  . denosumab (PROLIA) 60 MG/ML SOLN injection Inject 60 mg into the skin every 6 (six) months. Administer in upper arm, thigh, or abdomen  . Dextromethorphan-Guaifenesin (MUCINEX DM MAXIMUM STRENGTH) 60-1200 MG TB12 Take 1 tablet by mouth 2 (two) times daily as needed (for cough/phlegm).  .  DULoxetine (CYMBALTA) 60 MG capsule Take 60 mg by mouth daily.  . fluticasone (FLONASE) 50 MCG/ACT nasal spray Place 1-2 sprays into both nostrils daily as needed for allergies or rhinitis.  Marland Kitchen insulin aspart (NOVOLOG) 100 UNIT/ML injection Inject 4-6 Units into the skin 2 (two) times daily before a meal. Pens.  . insulin glargine (LANTUS) 100 UNIT/ML injection Inject 0.3 mLs (30 Units total) into the skin every morning. Pens  . Insulin Pen Needle 32G X 4 MM MISC Use 4x a day  . lansoprazole (PREVACID) 30 MG capsule Take 30 mg by mouth 2 (two) times daily.   Marland Kitchen levothyroxine (SYNTHROID, LEVOTHROID) 50 MCG tablet Take 1 tablet (50 mcg total) by mouth daily before breakfast.  . montelukast (SINGULAIR) 10 MG tablet Take 10 mg by mouth daily.   . NON FORMULARY 1 Syringe by Epidural route every 3 (three) months. Epidural Steroid Injections for Lumbar Spinal Stenosis up to 4 times a year  . oxyCODONE-acetaminophen (PERCOCET/ROXICET) 5-325 MG per tablet Take 1 tablet by mouth 2 (two) times daily as needed for severe pain.  . predniSONE (DELTASONE) 1 MG tablet Take 2 mg by mouth daily with breakfast.   . pregabalin (LYRICA) 225 MG capsule Take 1 capsule (225 mg total) by mouth 2 (two) times daily.  . ranitidine (ZANTAC) 150 MG tablet Take 150 mg by mouth 2 (two) times daily as needed for heartburn.  . rosuvastatin (CRESTOR) 10 MG tablet Take 10 mg by mouth every evening.   . tiotropium (SPIRIVA) 18 MCG inhalation capsule Place 18 mcg into inhaler and inhale daily.  . traMADol (ULTRAM) 50 MG tablet 50 mg 2 (two) times daily.  Marland Kitchen zolpidem (AMBIEN) 5 MG tablet Take 1 tablet (5 mg total) by mouth at bedtime.  . busPIRone (BUSPAR) 10 MG tablet Take 1 tablet (10 mg total) by mouth 2 (two) times daily. (Patient not taking: Reported on 01/07/2015)   No facility-administered encounter medications on file as of 01/07/2015.    Allergies  Allergen Reactions  . Latex Rash  . Penicillins Rash    Family History   Problem Relation Age of Onset  . Lung cancer Mother   . CAD Father   . Diabetes Father   . Lung cancer Maternal Aunt   . Diabetes Paternal Aunt   . Diabetes Paternal Uncle   . Diabetes Paternal Grandmother   . Diabetes Paternal Grandfather     Social History   Social History  .  Marital Status: Widowed    Spouse Name: N/A  . Number of Children: N/A  . Years of Education: N/A   Occupational History  . Not on file.   Social History Main Topics  . Smoking status: Passive Smoke Exposure - Never Smoker  . Smokeless tobacco: Never Used  . Alcohol Use: No  . Drug Use: Not on file  . Sexual Activity: Not on file   Other Topics Concern  . Not on file   Social History Narrative    Current Medications, Allergies, Past Medical History, Past Surgical History, Family History, and Social History were reviewed in [employer] record.   Review of Systems             All symptoms NEG except where BOLDED >>  Constitutional:  F/C/S, fatigue, anorexia, unexpected weight change. HEENT:  HA, visual changes, hearing loss, earache, nasal symptoms, sore throat, mouth sores, hoarseness. Resp:  cough, sputum, hemoptysis; SOB, tightness, wheezing. Cardio:  CP, palpit, DOE, orthopnea, edema. GI:  N/V/D/C, blood in stool; reflux, abd pain, distention, gas. GU:  dysuria, freq, urgency, hematuria, flank pain, voiding difficulty. MS:  joint pain, swelling, tenderness, decr ROM; neck pain, back pain, etc. Neuro:  HA, tremors, seizures, dizziness, syncope, weakness, numbness, gait abn. Skin:  suspicious lesions or skin rash. Heme:  adenopathy, bruising, bleeding. Psyche:  confusion, agitation, sleep disturbance, hallucinations, anxiety, depression suicidal.   Objective:   Physical Exam       Vital Signs:  Reviewed...  General:  WD, WN, 79 y/o WF in NAD; alert & oriented; pleasant & cooperative... HEENT:  [place]/AT; Conjunctiva- pink, Sclera- nonicteric, EOM-wnl, PERRLA,  EACs-clear, TMs-wnl; NOSE-clear; THROAT-clear & wnl. Neck:  Supple w/ fairROM; no JVD; normal carotid impulses w/o bruits; no thyromegaly or nodules palpated; no lymphadenopathy. Chest:  Decr BS bilat, few scat rhonchi otherw clear not congested no wheezing rales or signs of consolidation... Heart:  Regular Rhythm; norm S1 & S2 w/ Gr1/6 SEM w/o rubs or gallops detected... Abdomen:  Soft & nontender, +panniculus- no guarding or rebound; normal bowel sounds; no organomegaly or masses palpated. Ext:  decrROM; without deformities +arthritic changes; +venous insuffic, no edema;  Pulses intact w/o bruits. Neuro:  No focal neuro deficit; abn gait & balance poor, uses cane... Derm:  No lesions noted; no rash etc. Lymph:  No cervical, supraclavicular, axillary, or inguinal adenopathy palpated.   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 We will follow closely.    Cardiac> on Coreg6.25Bid for ?HBP; she has mild AS & DiastCHF on 2DEcho and this needs follow up...    Endocrine> IDDM, Hypothyroid, transiently elev serum calcium ?etiology (resolved spont); managed by DrGherghe; BS today is 45 & asked to eat & check w/ endocrine re- adjustment in Novolog...    GYN> Hx ovarian cancer (in remission- details unknown), s/p hyst    Ortho/ Rheum> DJD w/ TKR, FM on Lyrica etc, severe LBP requires epidural shots; PMR on Pred (details unknown); her Sed=52 and she will prob need Rheum to help sort this out...    Heme> Hg 10-11 range May2016 after  fall w/ fx nose & wrist; similar now & may need further anemia work-up as  well...     Plan:     Patient's Medications  New Prescriptions   No medications on file  Previous Medications   ADVAIR DISKUS 500-50 MCG/DOSE AEPB    INHALE 1 PUFF BY MOUTH TWICE DAILY   ALBUTEROL (PROVENTIL) (2.5 MG/3ML) 0.083% NEBULIZER SOLUTION    Take 2.5 mg by nebulization daily.   BUSPIRONE (BUSPAR) 10 MG TABLET    Take 1 tablet (10 mg total) by mouth 2 (two) times daily.   CARVEDILOL (COREG) 6.25 MG TABLET    Take 6.25 mg by mouth 2 (two) times daily with a meal.   CLINDAMYCIN (CLEOCIN) 150 MG CAPSULE    TK 4 CS PO 1 HOUR PRIOR TO APPOINTMENT   DENOSUMAB (PROLIA) 60 MG/ML SOLN INJECTION    Inject 60 mg into the skin every 6 (six) months. Administer in upper arm, thigh, or abdomen   DEXTROMETHORPHAN-GUAIFENESIN (MUCINEX DM MAXIMUM STRENGTH) 60-1200 MG TB12    Take 1 tablet by mouth 2 (two) times daily as needed (for cough/phlegm).   DULOXETINE (CYMBALTA) 60 MG CAPSULE    Take 60 mg by mouth daily.   FLUTICASONE (FLONASE) 50 MCG/ACT NASAL SPRAY    Place 1-2 sprays into both nostrils daily as needed for allergies or rhinitis.   INSULIN ASPART (NOVOLOG) 100 UNIT/ML INJECTION    Inject 4-6 Units into the skin 2 (two) times daily before a meal. Pens.   INSULIN GLARGINE (LANTUS) 100 UNIT/ML INJECTION    Inject 0.3 mLs (30 Units total) into the skin every morning. Pens   INSULIN PEN NEEDLE 32G X 4 MM MISC    Use 4x a day   LANSOPRAZOLE (PREVACID) 30 MG CAPSULE    Take 30 mg by mouth 2 (two) times daily.    LEVOTHYROXINE (SYNTHROID, LEVOTHROID) 50 MCG TABLET    Take 1 tablet (50 mcg total) by mouth daily before breakfast.   MONTELUKAST (SINGULAIR) 10 MG TABLET    Take 10 mg by mouth daily.    NON FORMULARY    1 Syringe by Epidural route every 3 (three) months. Epidural Steroid Injections for Lumbar Spinal Stenosis up to 4 times a year   OXYCODONE-ACETAMINOPHEN (PERCOCET/ROXICET) 5-325 MG PER TABLET    Take 1 tablet by mouth 2 (two) times daily as needed for severe pain.   PREDNISONE  (DELTASONE) 1 MG TABLET    Take 2 mg by mouth daily with breakfast.    PREGABALIN (LYRICA) 225 MG CAPSULE    Take 1 capsule (225 mg total) by mouth 2 (two) times daily.   RANITIDINE (ZANTAC) 150 MG TABLET    Take 150 mg by mouth 2 (two) times daily as needed for heartburn.   ROSUVASTATIN (CRESTOR) 10 MG TABLET    Take 10 mg by mouth every evening.    TIOTROPIUM (SPIRIVA) 18 MCG INHALATION CAPSULE    Place 18 mcg into inhaler and inhale daily.   TRAMADOL (ULTRAM) 50 MG TABLET    50 mg 2 (two) times daily.   ZOLPIDEM (AMBIEN) 5 MG TABLET    Take 1 tablet (5 mg total) by mouth at bedtime.  Modified Medications   No medications on file  Discontinued Medications   No medications on file

## 2015-01-07 NOTE — Patient Instructions (Signed)
Kayla Thompson-- it was nice meeting you today...  We discussed your pulmonary history & outlined a work-up to re-assess your lung situation>>    In this regard we are checking a pulmonary function test, an oxygen saturation test, a follow up CXR AND a High resolution CT scan of your lungs...       We will contact you w/ the results when available...   In the meanwhile we are going to continue your same medication regimen>>    Use the ADVAIR 500/50 one inhalation twice daily every day...    Use the [facility] handihaler- inhale contents of one capsule daily...    Continue the NEBULIZER w/ Albuterol up to 4 times daily as needed (ok to continue your current usage)...    Ok to contnue the Singulair as well...  Continue the OTC MUCINEX at 1200mg  daily as you are currently doing...  Stay as active as possible, the exercise is good for you...  Call for any questions...  Let's plan a follow up visit in 6 weeks, sooner if needed for problems.Marland KitchenMarland Kitchen

## 2015-01-07 NOTE — Telephone Encounter (Signed)
I spoke with [place] in lab.  She reports a critical Glucose of 45.  Notified Apolonio Schneiders, Dr Jeannine Kitten nurse.  Will forward this message to her and Dr Lenna Gilford for review.

## 2015-01-08 NOTE — Telephone Encounter (Signed)
SN has reviewed message   Nothing further is needed

## 2015-01-13 ENCOUNTER — Ambulatory Visit (INDEPENDENT_AMBULATORY_CARE_PROVIDER_SITE_OTHER)
Admission: RE | Admit: 2015-01-13 | Discharge: 2015-01-13 | Disposition: A | Discharge status: Home / Self Care | Payer: Medicare Other | Source: Ambulatory Visit | Attending: Pulmonary Disease | Admitting: Pulmonary Disease

## 2015-01-13 DIAGNOSIS — R059 Cough, unspecified: Secondary | ICD-10-CM

## 2015-01-13 DIAGNOSIS — J479 Bronchiectasis, uncomplicated: Secondary | ICD-10-CM | POA: Diagnosis not present

## 2015-01-13 DIAGNOSIS — R05 Cough: Secondary | ICD-10-CM

## 2015-01-13 DIAGNOSIS — R0600 Dyspnea, unspecified: Secondary | ICD-10-CM | POA: Diagnosis not present

## 2015-01-14 ENCOUNTER — Ambulatory Visit (INDEPENDENT_AMBULATORY_CARE_PROVIDER_SITE_OTHER): Payer: Medicare Other | Admitting: Internal Medicine

## 2015-01-14 ENCOUNTER — Encounter: Payer: Self-pay | Admitting: Internal Medicine

## 2015-01-14 VITALS — BP 130/80 | HR 87 | Temp 98.2°F | Resp 12 | Ht 60.0 in | Wt 154.4 lb

## 2015-01-14 DIAGNOSIS — M797 Fibromyalgia: Secondary | ICD-10-CM | POA: Diagnosis not present

## 2015-01-14 DIAGNOSIS — Z23 Encounter for immunization: Secondary | ICD-10-CM

## 2015-01-14 MED ORDER — PREGABALIN 300 MG PO CAPS: 300.0000 mg | ORAL_CAPSULE | Freq: Two times a day (BID) | ORAL | Status: DC

## 2015-01-14 MED ORDER — DEXLANSOPRAZOLE 30 MG PO CPDR: 30.0000 mg | DELAYED_RELEASE_CAPSULE | Freq: Every day | ORAL | Status: DC

## 2015-01-14 NOTE — Progress Notes (Signed)
Pre visit review using our clinic review tool, if applicable. No additional management support is needed unless otherwise documented below in the visit note. 

## 2015-01-14 NOTE — Patient Instructions (Signed)
We will not check any blood work today.   We will increase the lyrica to 300 mg twice a day. We have sent in the dexilant for the stomach instead of the lansoprazole.

## 2015-01-16 ENCOUNTER — Telehealth: Payer: Self-pay | Admitting: Internal Medicine

## 2015-01-16 DIAGNOSIS — M1990 Unspecified osteoarthritis, unspecified site: Secondary | ICD-10-CM

## 2015-01-16 NOTE — Telephone Encounter (Signed)
For what?

## 2015-01-16 NOTE — Telephone Encounter (Signed)
Needs referral for rheumatologist.

## 2015-01-17 ENCOUNTER — Telehealth: Payer: Self-pay | Admitting: *Deleted

## 2015-01-17 ENCOUNTER — Telehealth: Payer: Self-pay | Admitting: Internal Medicine

## 2015-01-17 DIAGNOSIS — Z23 Encounter for immunization: Secondary | ICD-10-CM

## 2015-01-17 NOTE — Progress Notes (Signed)
   Subjective:    Patient ID: Kayla Thompson, female    DOB: March 21, 1936, 79 y.o.   MRN: [id number]  HPI The patient is a 79 YO female coming in for follow up of her fibromyalgia. She is doing okay on the lyrica 200 mg BID but wants to see if we can increase it slightly to do better. She is more functional. She is not drowsy or confused with the lyrica. Denies side effects. Sleeping better when she is not in pain.   Review of Systems  Constitutional: Positive for activity change. Negative for fever, appetite change, fatigue and unexpected weight change.  Respiratory: Negative for cough, chest tightness, shortness of breath and wheezing.   Cardiovascular: Negative for chest pain, palpitations and leg swelling.  Gastrointestinal: Negative for abdominal pain, diarrhea, constipation and abdominal distention.  Musculoskeletal: Positive for myalgias, back pain and arthralgias.  Skin: Negative.   Neurological: Negative.   Psychiatric/Behavioral: Negative for suicidal ideas, sleep disturbance, self-injury, dysphoric mood and decreased concentration. The patient is not nervous/anxious.       Objective:   Physical Exam  Constitutional: She is oriented to person, place, and time. She appears well-developed and well-nourished.  Overweight  HENT:  Head: Normocephalic and atraumatic.  Eyes: EOM are normal.  Neck: Normal range of motion.  Cardiovascular: Normal rate and regular rhythm.   Pulmonary/Chest: Effort normal and breath sounds normal.  Abdominal: Soft. Bowel sounds are normal. She exhibits no distension. There is no tenderness. There is no rebound.  Musculoskeletal: She exhibits tenderness.  Neurological: She is alert and oriented to person, place, and time. Coordination normal.  Skin: Skin is warm and dry.  Psychiatric: She has a normal mood and affect.    Filed Vitals:   01/14/15 1513  BP: 130/80  Pulse: 87  Temp: 98.2 F (36.8 C)  TempSrc: Oral  Resp: 12  Height: 5' (1.524 m)    Weight: 154 lb 6.4 oz (70.035 kg)  SpO2: 95%      Assessment & Plan:

## 2015-01-17 NOTE — Telephone Encounter (Signed)
Left msg on triage stating md was suppose to increase Lyrica 300 mg to twice a day. Pharmacy states they have not receive script. Pls advise...Kayla Thompson

## 2015-01-17 NOTE — Telephone Encounter (Signed)
Script was given to her at visit.

## 2015-01-17 NOTE — Telephone Encounter (Signed)
Notified pt with md response.../lmb 

## 2015-01-17 NOTE — Telephone Encounter (Signed)
Pt called stated she lost her written rx for Lyrica 300 mg , can we please send another one to [facility].

## 2015-01-17 NOTE — Assessment & Plan Note (Signed)
Improved but still needs better pain control. Increase lyrica to 300 mg BID which is maximum dosing. Rx given to her at visit. She will call back with problems or questions and follow up for control.

## 2015-01-17 NOTE — Telephone Encounter (Signed)
Referral placed.

## 2015-01-17 NOTE — Telephone Encounter (Signed)
Patient said she has always had a rheumatologist for her arthritis and she just moved here from [place] and she does not have one.

## 2015-01-17 NOTE — Addendum Note (Signed)
Addended by: Pricilla Holm A on: 01/17/2015 02:29 PM   Modules accepted: Orders

## 2015-01-28 ENCOUNTER — Ambulatory Visit (INDEPENDENT_AMBULATORY_CARE_PROVIDER_SITE_OTHER): Payer: Medicare Other | Admitting: Internal Medicine

## 2015-01-28 ENCOUNTER — Encounter: Payer: Self-pay | Admitting: Internal Medicine

## 2015-01-28 VITALS — BP 110/68 | HR 73 | Temp 98.0°F | Resp 12 | Wt 160.0 lb

## 2015-01-28 DIAGNOSIS — E213 Hyperparathyroidism, unspecified: Secondary | ICD-10-CM | POA: Diagnosis not present

## 2015-01-28 DIAGNOSIS — E1165 Type 2 diabetes mellitus with hyperglycemia: Secondary | ICD-10-CM | POA: Diagnosis not present

## 2015-01-28 DIAGNOSIS — Z794 Long term (current) use of insulin: Secondary | ICD-10-CM | POA: Diagnosis not present

## 2015-01-28 LAB — LIPID PANEL
Cholesterol: 161 mg/dL (ref 0–200)
HDL: 73.7 mg/dL (ref >39.00)
LDL Cholesterol: 76 mg/dL (ref 0–99)
NonHDL: 87
Total CHOL/HDL Ratio: 2
Triglycerides: 56 mg/dL (ref 0.0–149.0)
VLDL: 11.2 mg/dL (ref 0.0–40.0)

## 2015-01-28 LAB — HEMOGLOBIN A1C: Hgb A1c MFr Bld: 6.5 % (ref 4.6–6.5)

## 2015-01-28 LAB — VITAMIN D 25 HYDROXY (VIT D DEFICIENCY, FRACTURES): VITD: 65.6 ng/mL (ref 30.00–100.00)

## 2015-01-28 NOTE — Patient Instructions (Signed)
Please stop at the lab.  Please continue current insulin regimen for now: - Lantus 30 units in am - NovoLog 4-6 units 2x a day before meals  Check sugars 2-3x a day, usually before meals and at bedtime.  Please return in 3 months with your sugar log.   Patient information (Up-to-Date): Collection of a 24-hour urine specimen   - You should collect every drop of urine during each 24-hour period. It does not matter how much or little urine is passed each time, as long as every drop is collected. - Begin the urine collection in the morning after you wake up, after you have emptied your bladder for the first time. - Urinate (empty the bladder) for the first time and flush it down the toilet. Note the exact time (eg, 6:15 AM). You will begin the urine collection at this time. - Collect every drop of urine during the day and night in an empty collection bottle. Store the bottle at room temperature or in the refrigerator. - If you need to have a bowel movement, any urine passed with the bowel movement should be collected. Try not to include feces with the urine collection. If feces does get mixed in, do not try to remove the feces from the urine collection bottle. - Finish by collecting the first urine passed the next morning, adding it to the collection bottle. This should be within ten minutes before or after the time of the first morning void on the first day (which was flushed). In this example, you would try to void between 6:05 and 6:25 on the second day. - If you need to urinate one hour before the final collection time, drink a full glass of water so that you can void again at the appropriate time. If you have to urinate 20 minutes before, try to hold the urine until the proper time. - Please note the exact time of the final collection, even if it is not the same time as when collection began on day 1. - The bottle(s) may be kept at room temperature for a day or two, but should be kept cool or  refrigerated for longer periods of time.

## 2015-01-28 NOTE — Progress Notes (Signed)
Patient ID: Kayla Thompson, female   DOB: [birth year], 79 y.o.   MRN: [id number]   HPI  Kayla Thompson is a 79 y.o.-year-old female, initially referred by her OB/GYN doctor, Dr. Uvaldo Rising, for evaluation for hyperparathyroidism + normo/hypo-calcemia and DM2. Last visit 3 mo ago.  Reviewed and addended hx: Pt was dx with hyperparathyroidism in 08/2014.Pertinent labs: Lab Results  Component Value Date   PTH 73* 10/28/2014   PTH Comment 10/28/2014   PTH 190* 09/10/2014   PTH 243* 08/23/2014   CALCIUM 9.7 01/07/2015   CALCIUM 8.7 10/28/2014   CALCIUM 9.2 08/23/2014   CALCIUM 8.0* 08/02/2014   Of note, patient has osteoporosis, dx in 2007.  I reviewed pt's DEXA scans - last from 07/25/2014: Date L1-L2 (L3 and L4 excluded) T score FN T score  07/25/2014   -0.1  LFN: -2.7   She had multiple fractures: - 09/23/1990: pelvic fx - 02/2005: R hip fx - rod - 05/2006: rib fx - 12/2008: 3 R ankle fx - in [place] - plate and screws - 44/9201: wrist fx's 01/2012: steroid inj's R knee - 07/2014: forearm fx and nasal bone fx  She started Prolia 3 or 4 years ago - last inj 07/2014.  No h/o kidney stones.  No h/o CKD. Last BUN/Cr: Lab Results  Component Value Date   BUN 11 01/07/2015   CREATININE 0.92 01/07/2015   Pt is not on HCTZ.  No h/o vitamin D deficiency. Last vit D level was 48 in 08/23/2014.  Pt is on calcium (caltrate) 600 mg 2x a day and vitamin D 4000 IU daily; she also eats dairy and green, leafy, vegetables - but not lately during the move.  Had R knee replacement.  Pt does not have a FH of hypercalcemia, pituitary tumors, thyroid cancer, or osteoporosis.   Labs point towards secondary HPTH due to malabsorption of Ca or hyperexcretion of calcium: Component     Latest Ref Rng 10/28/2014 10/28/2014 11/08/2014         1:51 PM  1:51 PM   Calcium     8.7 - 10.3 mg/dL  8.7   PTH     15 - 65 pg/mL  73 (H)   Vitamin D 1, 25 (OH) Total     18 - 72 pg/mL   52  Vitamin D3 1,  25 (OH)        52  Vitamin D2 1, 25 (OH)        <8  Calcium Ionized     1.12 - 1.32 mmol/L 1.21    Magnesium     1.5 - 2.5 mg/dL   2.1  Phosphorus     2.3 - 4.6 mg/dL   3.8   At last visit, I advised her to bring a 24h urine collection to check urinary calcium. She did not bring this yet.  She also has DM2: She was dx in 2010. She started insulin in 2010.  Lab Results  Component Value Date   HGBA1C 6.2 10/28/2014   She is on: - Lantus 30 units in am - Novolog 4-6 units 2x a day  She brings a log: - am: 76-122, 157 >> 67, 77-137, 155 - after lunch: 120 - bedtime: 142, 228  Last dilated eye exam: 06/2014 (in [place]) >> no DR.  No Lipids available for review.She takes Crestor 10 mg daily.  Latest BUN/Cr: Lab Results  Component Value Date   BUN 11 01/07/2015   Lab Results  Component Value Date  CREATININE 0.92 01/07/2015   I reviewed her chart and she also has a history of Ovarian cancer - 1996, urinary incontinence, HTN, HL, hypothyroidism: Lab Results  Component Value Date   TSH 0.591 08/02/2014   ROS: Constitutional: no weight gain/loss, no fatigue, no subjective hyperthermia/hypothermia, + increased urination Eyes: no blurry vision, no xerophthalmia ENT: no sore throat, no nodules palpated in throat, no dysphagia/odynophagia, no hoarseness Cardiovascular: no CP/SOB/palpitations/leg swelling Respiratory: no cough/SOB Gastrointestinal: no N/V/D/C Musculoskeletal: no muscle/joint aches Skin: no rashes Neurological: no tremors/numbness/tingling/dizziness  I reviewed pt's medications, allergies, PMH, social hx, family hx, and changes were documented in the history of present illness. Otherwise, unchanged from my initial visit note.  Past Medical History  Diagnosis Date  . Hypertension   . Diabetes mellitus without complication ([place])   . Asthma   . Ovarian cancer ([place])   . Fibromyalgia   . Osteoporosis   . Arthritis   . GERD (gastroesophageal reflux  disease)   . History of chemotherapy   . History of fractured pelvis   . Osteoporosis    Past Surgical History  Procedure Laterality Date  . Hip surgey    . Knee surgey    . Lung surgery    . Bladder suspension    . Ankle fracture surgery      plate and screws  . Abdominal hysterectomy  1996   History   Social History  . Marital Status: Widowed    Spouse Name: N/A  . Number of Children: 2 (1 adopted)   Occupational History  . retired   Social History Main Topics  . Smoking status: Never Smoker   . Smokeless tobacco: Never Used  . Alcohol Use: No  . Drug Use: No   Current Outpatient Prescriptions on File Prior to Visit  Medication Sig Dispense Refill  . ADVAIR DISKUS 500-50 MCG/DOSE AEPB INHALE 1 PUFF BY MOUTH TWICE DAILY 180 each 3  . albuterol (PROVENTIL) (2.5 MG/3ML) 0.083% nebulizer solution Take 2.5 mg by nebulization daily.    . busPIRone (BUSPAR) 10 MG tablet Take 1 tablet (10 mg total) by mouth 2 (two) times daily. 60 tablet 2  . carvedilol (COREG) 6.25 MG tablet Take 6.25 mg by mouth 2 (two) times daily with a meal.    . clindamycin (CLEOCIN) 150 MG capsule TK 4 CS PO 1 HOUR PRIOR TO APPOINTMENT  0  . denosumab (PROLIA) 60 MG/ML SOLN injection Inject 60 mg into the skin every 6 (six) months. Administer in upper arm, thigh, or abdomen    . Dexlansoprazole 30 MG capsule Take 1 capsule (30 mg total) by mouth daily. 90 capsule 3  . Dextromethorphan-Guaifenesin (MUCINEX DM MAXIMUM STRENGTH) 60-1200 MG TB12 Take 1 tablet by mouth 2 (two) times daily as needed (for cough/phlegm).    . DULoxetine (CYMBALTA) 60 MG capsule Take 60 mg by mouth daily.    . fluticasone (FLONASE) 50 MCG/ACT nasal spray Place 1-2 sprays into both nostrils daily as needed for allergies or rhinitis. 16 g 6  . insulin aspart (NOVOLOG) 100 UNIT/ML injection Inject 4-6 Units into the skin 2 (two) times daily before a meal. Pens. 15 mL 2  . insulin glargine (LANTUS) 100 UNIT/ML injection Inject 0.3 mLs  (30 Units total) into the skin every morning. Pens 15 mL 2  . Insulin Pen Needle 32G X 4 MM MISC Use 4x a day 300 each 11  . lansoprazole (PREVACID) 30 MG capsule Take 30 mg by mouth 2 (two) times daily.     Marland Kitchen  levothyroxine (SYNTHROID, LEVOTHROID) 50 MCG tablet Take 1 tablet (50 mcg total) by mouth daily before breakfast. 30 tablet 3  . montelukast (SINGULAIR) 10 MG tablet Take 10 mg by mouth daily.     . NON FORMULARY 1 Syringe by Epidural route every 3 (three) months. Epidural Steroid Injections for Lumbar Spinal Stenosis up to 4 times a year    . oxyCODONE-acetaminophen (PERCOCET/ROXICET) 5-325 MG per tablet Take 1 tablet by mouth 2 (two) times daily as needed for severe pain. 45 tablet 0  . predniSONE (DELTASONE) 1 MG tablet Take 2 mg by mouth daily with breakfast.     . pregabalin (LYRICA) 300 MG capsule Take 1 capsule (300 mg total) by mouth 2 (two) times daily. 180 capsule 1  . ranitidine (ZANTAC) 150 MG tablet Take 150 mg by mouth 2 (two) times daily as needed for heartburn.    . rosuvastatin (CRESTOR) 10 MG tablet Take 10 mg by mouth every evening.     . tiotropium (SPIRIVA) 18 MCG inhalation capsule Place 18 mcg into inhaler and inhale daily.    . traMADol (ULTRAM) 50 MG tablet 50 mg 2 (two) times daily.    Marland Kitchen zolpidem (AMBIEN) 5 MG tablet Take 1 tablet (5 mg total) by mouth at bedtime. 30 tablet 2   No current facility-administered medications on file prior to visit.   Allergies  Allergen Reactions  . Latex Rash  . Penicillins Rash   Family History  Problem Relation Age of Onset  . Lung cancer Mother   . CAD Father   . Diabetes Father   . Lung cancer Maternal Aunt   . Diabetes Paternal Aunt   . Diabetes Paternal Uncle   . Diabetes Paternal Grandmother   . Diabetes Paternal Grandfather    PE: BP 110/68 mmHg  Pulse 73  Temp(Src) 98 F (36.7 C) (Oral)  Resp 12  Wt 160 lb (72.576 kg)  SpO2 94% Body mass index is 31.25 kg/(m^2). Wt Readings from Last 3 Encounters:   01/28/15 160 lb (72.576 kg)  01/14/15 154 lb 6.4 oz (70.035 kg)  01/07/15 153 lb 12.8 oz (69.763 kg)   Constitutional: Normal weight, in NAD. No kyphosis. Eyes: PERRLA, EOMI, no exophthalmos ENT: moist mucous membranes, no thyromegaly, no cervical lymphadenopathy Cardiovascular: RRR, No MRG Respiratory: CTA B Gastrointestinal: abdomen soft, NT, ND, BS+ Musculoskeletal: no deformities, strength intact in all 4 Skin: moist, warm, no rashes Neurological: no tremor with outstretched hands, DTR normal in all 4  Assessment: 1. Hyperparathyroidism - Hypo-/normocalcemia   2. DM2, insulin-dependent, controlled, without complications  Plan: 1. [place] - Patient has had 3 instances of a high parathyroid hormone, in the setting of initially a low calcium (and low ionized calcium) and then normal calcium. Both set of labs were drawn apparently after Prolia infusion, which can lower calcium levels. She does not have a history of hypercalcemia. She is taking calcium supplements, 600 mg 2x a day (Caltrate). Of note, patient has a normal vitamin D level, at 48, 4 months ago. - Her parathyroid hormone is decreasing, and her calcium is low normal. At last visit, I advised her to do the urine collection to see if her 24-hour urine calcium is low. In this case, her elevated PTH is likely secondary to low calcium intake/absorption. To help with this, we can switch to calcium citrate and increase her calcium dose. We also make sure that her vitamin D remains normal. Alternatively, if her urine calcium is high, I suspect that the  high PTH is secondary to hypercalciuria, rather than hypercalciuria being a consequence of high PTH. In this case, we may try a low-dose HCTZ. - I doubt that she has primary hyperparathyroidism, however this is a possibility. I discussed with the patient about consequences and the possibility of treatment eye surgery. She absolutely refuses surgery at this point.  2. DM2 - Patient with  long-standing, on basal bolus insulin regimen, with fairly good control, however, we again do not have enough sugars later in the day. - I advised her to  Patient Instructions  Please stop at the lab.  Please continue current insulin regimen for now: - Lantus 30 units in am - NovoLog 4-6 units 2x a day before meals  Check sugars 2-3x a day, usually before meals and at bedtime.  Please return in 3 months with your sugar log.   - continuechecking sugars at different times of the day - check 2-3 times a day, rotating checks - advised for yearly eye exams >> she is UTD - Will check a hemoglobin A1c and lipids today Return in about 4 months (around 05/28/2015).  Component     Latest Ref Rng 01/28/2015  Cholesterol     0 - 200 mg/dL 161  Triglycerides     0.0 - 149.0 mg/dL 56.0  HDL Cholesterol     >39.00 mg/dL 73.70  VLDL     0.0 - 40.0 mg/dL 11.2  LDL (calc)     0 - 99 mg/dL 76  Total CHOL/HDL Ratio      2  NonHDL      87.00  Hemoglobin A1C     4.6 - 6.5 % 6.5  VITD     30.00 - 100.00 ng/mL 65.60

## 2015-01-29 ENCOUNTER — Telehealth: Payer: Self-pay | Admitting: Internal Medicine

## 2015-01-29 MED ORDER — INSULIN PEN NEEDLE 32G X 4 MM MISC: Status: DC

## 2015-01-29 MED ORDER — INSULIN GLARGINE 100 UNIT/ML ~~LOC~~ SOLN: 30.0000 [IU] | Freq: Every morning | SUBCUTANEOUS | Status: DC

## 2015-01-29 NOTE — Telephone Encounter (Signed)
Refill sent to pt's pharmacy. 

## 2015-01-29 NOTE — Telephone Encounter (Signed)
Patient called stating that she would like a refill on her medication   Rx: Lantus and pen needles   Pharmacy: [facility]   Thank you

## 2015-01-30 ENCOUNTER — Ambulatory Visit: Payer: Medicare Other

## 2015-02-19 ENCOUNTER — Encounter: Payer: Self-pay | Admitting: Pulmonary Disease

## 2015-02-19 ENCOUNTER — Ambulatory Visit (INDEPENDENT_AMBULATORY_CARE_PROVIDER_SITE_OTHER): Payer: Medicare Other | Admitting: Pulmonary Disease

## 2015-02-19 ENCOUNTER — Telehealth: Payer: Self-pay | Admitting: Pulmonary Disease

## 2015-02-19 VITALS — BP 128/78 | HR 94 | Temp 98.0°F | Wt 162.4 lb

## 2015-02-19 DIAGNOSIS — R05 Cough: Secondary | ICD-10-CM | POA: Diagnosis not present

## 2015-02-19 DIAGNOSIS — J471 Bronchiectasis with (acute) exacerbation: Secondary | ICD-10-CM | POA: Diagnosis not present

## 2015-02-19 DIAGNOSIS — R0600 Dyspnea, unspecified: Secondary | ICD-10-CM

## 2015-02-19 DIAGNOSIS — R059 Cough, unspecified: Secondary | ICD-10-CM

## 2015-02-19 MED ORDER — LEVALBUTEROL HCL 0.63 MG/3ML IN NEBU
0.6300 mg | INHALATION_SOLUTION | Freq: Once | RESPIRATORY_TRACT | Status: AC
  Administered 2015-02-19: 0.63 mg via RESPIRATORY_TRACT

## 2015-02-19 MED ORDER — FLUTICASONE PROPIONATE 50 MCG/ACT NA SUSP: 1.0000 | Freq: Every day | NASAL | Status: DC | PRN

## 2015-02-19 MED ORDER — AZITHROMYCIN 500 MG PO TABS: 500.0000 mg | ORAL_TABLET | Freq: Every day | ORAL | Status: DC

## 2015-02-19 MED ORDER — ROSUVASTATIN CALCIUM 10 MG PO TABS: 10.0000 mg | ORAL_TABLET | Freq: Every evening | ORAL | Status: DC

## 2015-02-19 NOTE — Telephone Encounter (Signed)
Pt calling to check on status of rx, please advise.Kayla Thompson

## 2015-02-19 NOTE — Telephone Encounter (Signed)
Per 02/19/15: We are going to help your upper resp infection w/ Zithromax-ZPak (take as directed)... ---  Called spoke with pt. She reports Dr. Lenna Gilford was going to change the ZPAK since this does not help her. She checked with pharm and nothing has been called in. Please advise SN thanks  Allergies  Allergen Reactions  . Latex Rash  . Penicillins Rash     Current Outpatient Prescriptions on File Prior to Visit  Medication Sig Dispense Refill  . ADVAIR DISKUS 500-50 MCG/DOSE AEPB INHALE 1 PUFF BY MOUTH TWICE DAILY 180 each 3  . albuterol (PROVENTIL) (2.5 MG/3ML) 0.083% nebulizer solution Take 2.5 mg by nebulization daily.    . busPIRone (BUSPAR) 10 MG tablet Take 1 tablet (10 mg total) by mouth 2 (two) times daily. 60 tablet 2  . carvedilol (COREG) 6.25 MG tablet Take 6.25 mg by mouth 2 (two) times daily with a meal.    . clindamycin (CLEOCIN) 150 MG capsule TK 4 CS PO 1 HOUR PRIOR TO APPOINTMENT  0  . denosumab (PROLIA) 60 MG/ML SOLN injection Inject 60 mg into the skin every 6 (six) months. Administer in upper arm, thigh, or abdomen    . Dexlansoprazole 30 MG capsule Take 1 capsule (30 mg total) by mouth daily. 90 capsule 3  . Dextromethorphan-Guaifenesin (MUCINEX DM MAXIMUM STRENGTH) 60-1200 MG TB12 Take 1 tablet by mouth 2 (two) times daily as needed (for cough/phlegm).    . DULoxetine (CYMBALTA) 60 MG capsule Take 60 mg by mouth daily.    . fluticasone (FLONASE) 50 MCG/ACT nasal spray Place 1-2 sprays into both nostrils daily as needed for allergies or rhinitis. 16 g 6  . insulin aspart (NOVOLOG) 100 UNIT/ML injection Inject 4-6 Units into the skin 2 (two) times daily before a meal. Pens. 15 mL 2  . insulin glargine (LANTUS) 100 UNIT/ML injection Inject 0.3 mLs (30 Units total) into the skin every morning. Pens 15 mL 2  . Insulin Pen Needle 32G X 4 MM MISC Use 4x a day 360 each 1  . lansoprazole (PREVACID) 30 MG capsule Take 30 mg by mouth 2 (two) times daily.     Marland Kitchen levothyroxine  (SYNTHROID, LEVOTHROID) 50 MCG tablet Take 1 tablet (50 mcg total) by mouth daily before breakfast. (Patient not taking: Reported on 02/19/2015) 30 tablet 3  . montelukast (SINGULAIR) 10 MG tablet Take 10 mg by mouth daily.     . NON FORMULARY 1 Syringe by Epidural route every 3 (three) months. Epidural Steroid Injections for Lumbar Spinal Stenosis up to 4 times a year    . oxyCODONE-acetaminophen (PERCOCET/ROXICET) 5-325 MG per tablet Take 1 tablet by mouth 2 (two) times daily as needed for severe pain. 45 tablet 0  . predniSONE (DELTASONE) 1 MG tablet Take 2 mg by mouth daily with breakfast.     . pregabalin (LYRICA) 300 MG capsule Take 1 capsule (300 mg total) by mouth 2 (two) times daily. 180 capsule 1  . ranitidine (ZANTAC) 150 MG tablet Take 150 mg by mouth 2 (two) times daily as needed for heartburn.    . rosuvastatin (CRESTOR) 10 MG tablet Take 1 tablet (10 mg total) by mouth every evening. 30 tablet 2  . tiotropium (SPIRIVA) 18 MCG inhalation capsule Place 18 mcg into inhaler and inhale daily.    . traMADol (ULTRAM) 50 MG tablet 50 mg 2 (two) times daily.    Marland Kitchen zolpidem (AMBIEN) 5 MG tablet Take 1 tablet (5 mg total) by mouth at bedtime.  30 tablet 2   No current facility-administered medications on file prior to visit.

## 2015-02-19 NOTE — Patient Instructions (Signed)
Today we updated your med list in our EPIC system...    Continue your current medications the same...  We discussed a simple 3 time a day breathing regimen for you>> Morning/  Mid-day/  Evening    Start w/ your NEBULIZER treatment 3 times daily...    Follow this w/ ADVAIR- 2sprays after the AM & Eve nebs; & use the SPIRIVA after the mid-day neb...    Use the 1200mg  MUCINEX + water after the 1st & last treatments of the day...  Practice the purse-lip breathing to aide in expectorating the phlegm...  We are going to help your upper resp infection w/ Zithromax-ZPak (take as directed)...  Call for any questions...  Let's plan a follow up visit in 91mo, sooner if needed for problems.Marland KitchenMarland Kitchen

## 2015-02-19 NOTE — Telephone Encounter (Signed)
Per SN>> Send in Zithromax 500mg  # 5 with instructions to take 1 po q day until gone  Order sent to pharmacy  Nothing further is needed

## 2015-02-20 ENCOUNTER — Encounter: Payer: Self-pay | Admitting: Pulmonary Disease

## 2015-02-20 NOTE — Progress Notes (Signed)
Subjective:     Patient ID: Kayla Thompson, female   DOB: [birth year], 79 y.o.   MRN: [id number]  HPI   ~  January 07, 2015:  Initial pulmonary consult w/ SN>      66 y/o WF, referred by [name]) for a pulmonary evaluation due to cough & dyspnea>  Kayla Thompson moved to [place] about 80mo ago to be near her daughter, granddaughter, and great-grands after her husb passed away;  Shortly after arrival she fell & was [facility] w/ fx nasal bones and left wrist (distal radius & ulna) managed by Burnett Sheng summary reviewed... She established Primary Care w/ DrKollar, [place], Endocrine- DrGherghe... She has a Hx of pulmonary problems identified by the Pt & records from [facility] as Bronchiectasis and Asthma;  Her CC is SOB/ DOE w/ min activity & it is apparent that she is rather sedentary and a poor historian; she notes mild cough, some yellow sputum, no hemoptysis; she denies CP, palpit, or signif cardiac history...  Smoking Hx>  She is a never smoker, but had signif 2nd hand exposure from her husb...  Pulmonary Hx>  She indicates hx left lung surg 2000 for benign granuloma; hx bronchiectasis but she does not know how the Dx was established; told asthma as well ever since early 2000's but not as a child or young adult; states she had pneumonia age 87 at same time as the measles (not hosp & made full recovery w/o apparent residual resp problems); she does admit to recurrent bronchitic infections as adult requiring antibiotics & occas Pred rx... She adds a rambling hx of work-ups at [facility] (in [place] in [place] but she does not know what this was for?       Notes from Dr. 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   Family Hx>  Mother died from lung cancer (as did one of her sisters);  Father passed from heart disease.  Occup Hx>  No known occupational exposures  Current Meds>  SEE FULL MED LIST BELOW EXAM showed Afeb, VSS, O2sat=99% on RA;  Heent- neg, mallampati3;  Chest- decr BS bilat, not congested, w/o w/r/r heard;  Heart- RR gr1/6SEM w/o r/g detected;  Abd- soft, nontender, +panniculus;  Ext- VI w/o c/c/e...   CT Chest 11/17/09 in [place]> mult noncalcif pulm nodules bilat (stable when compared to older scans), mucoid impaction LUL & RML, pleuroparenchymal scarring right base (unchanged), no adenopathy, norm heart size & coronary calcif.  PFTs 02/04/10 in [place]> FVC=2.20 (95%), FEV1=1.42 (88%), %1sec=65, mid-flows reduced at 38% predicted; no improvement in FEV1 after bronchodil; TLC=4.70 (118%), RV=2.48 (153%), RV/TLC=53%; DLCO=54% predicted.  CXR 01/14/14 reported to show norm heart size, clear lungs, thoracic spondylosis, NAD...   CXR 08/02/14 in ER here after fall showed norm heart size,  diffuse interstitial coarsening w/o focal airsp dis...   2DEcho 08/02/14 in [place] after fall showed norm LVF w/ EF=55-60%, no regional wall motion abn, Gr1 DD, mild AS w/ mean gradient 55mmHg; mod calcif MV leaflets w/o stenosis or regurg; RV looked OK but could not est PAsys...  CXR here 01/07/15 showed norm heart size, incr AP diameter of chest & flat diaphragms, incr markings diffusely, old right rib fxs, osteopenia...   Spirometry 01/07/15> we attempted mult trial of simple spirometry but pt  could not generate an acceptable curve & results are not reliable...  Ambulatory oxygen saturation test 01/07/15> O2sat=98% on RA at rest w/ pulse=79/min; she ambulated 2 laps in office & stopped w/ dyspnea, lowest O2sat=87% w/ pulse=95/min  LABS 01/07/15>  Chems- wnl x BS=45;  CBC- Hg=11.0, MCV=87, WBC=16.3, diff ok;  Sed=52 IMP/PLAN>>  Complex 79 y/o woman:    Pulmonary> Hx COPD/ Bronchiectasis, Hx granulomatous lung dis & reported atyp mycobacterium in the past, ?other ILD- awaiting Hi-res CT Chest results; she has clinical hx asthma in the past but no reversibility on prev PFTs; REC to continue Advair500, Spiriva, NEBS, Singulair, Mucinex, Ventolin HFA for now; may need further ILD work-up w/ collagen-vasc screen [employer], sputum studies, etc... We will follow closely.    Cardiac> on Coreg6.25Bid for ?HBP; she has mild AS & DiastCHF on 2DEcho and this needs follow up...    Endocrine> IDDM, Hypothyroid, transiently elev serum calcium ?etiology (resolved spont); managed by DrGherghe; BS today is 45 & asked to eat & check w/ endocrine re- adjustment in [employer]...    GYN> Hx ovarian cancer (in remission- details unknown), s/p hyst    Ortho/ Rheum> DJD w/ TKR, FM on Lyrica etc, severe LBP requires epidural shots; PMR on Pred (details unknown); her Sed=52 and she will prob need Rheum to help sort this out...    Heme> Hg 10-11 range May2016 after fall w/ fx nose & wrist; similar now & may need further anemia work-up as well...  ADDENDUM>>  Hi-res CT Chest 01/14/15:  Norm heart size, atherosclerosis of Ao/ great vessels/ coronaries, no adenopathy, atrophic thyroid; ?small sliding pulm hernia betw the lat 7th & 8th ribs w/ scarring; extensive bronchiectasis bilat w/ mucoid impactions within dilated airways (infectious/inflamm bronchiolitis- r/o MAI), 7mm LLL nodule, tracheobronchomalacia w/ extensive bilat air trapping on expiration, smallHH, old healed right 6th-7th rib fx, DJD Tspine...  REC> continue  NEBS, Advair, Spiriva, Mucinex (maximize to 600mg  Qid w/ fluids), practice purse lip breathing to splint the airways open & aide in expectoration of phlegm, use FLUTTER valve & postural drainage to get the sput out; collect sput for routine C&S, AFB, Fungi => TC w/ pt: she has a lot going on, refuses further meds, refuses further eval, can't be bothered w/ learning Chest PT/ Flutter, postural drainage, purse lip breathing, collecting sputum specimen, etc; states she's had all this a long time & not interested in more aggressive pulmonary treatments at this time... She will f/u w/ me in November & we will discuss again.  ~  February 19, 2015:  6wk ROV w/ SN>  SEE ABOVE-- pt notes nasal congestion, chest congestion, sl cough & incr SOB for the last wk or so; in the next breath she says "I seldom get a cold", thinks it's all allergies, notes great-grnads recently ill;  She has NEBS w/ Albut (only using once per day), Advair500Bid, Spiriva once daily, Mucinex (?how much), Pred?1mg - 3/d, Singulair10/ Zyrtek10...  Since she was here last she has followed  up w/ PCP-DrCrawford 10/25> FM doing ok on Lyrica & she reported more functional & resting better...   She also had a f/u w/ Endocrine-DrGherghe 11/8> DM, osteoporosis, hypothyroid, & Hx hyperpara; Labs reviewed & ok (A1c=6.5 on her insulin, Ca is wnl and vitD=66)...    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     Cardiac> on Coreg6.25Bid for ?HBP; she has mild AS & DiastCHF on  2DEcho and this needs follow up w/ her PCP vs Cards...    Endocrine> IDDM, Hypothyroid, transiently elev serum calcium ?etiology (resolved spont); managed by DrGherghe...    GYN> Hx ovarian cancer (in remission- details unknown), s/p hyst & Ca125=9 (08/2014)...    Ortho/ Rheum> DJD w/ TKR, FM on Lyrica, Tramadol, Percocet,etc, severe LBP requires epidural shots; PMR on Pred (details unknown); her Sed=52 and she will prob need Rheum to help sort this out...    Heme> Hg 10-11 range May2016 after fall w/ fx nose & wrist; similar now & may need further anemia work-up as well... EXAM showed Afeb, VSS, O2sat=93% on RA;  Wt=162#;  Heent- neg, mallampati3;  Chest- decr BS bilat, not congested, w/o w/r/r heard;  Heart- RR gr1/6SEM w/o r/g detected;  Abd- soft, nontender, +panniculus;  Ext- VI w/o c/c/e...   Pt given a neb treatment in office- unable to produce any sput for cultures... IMP/PLAN>>  We discussed Rx w/ ZPak for her URI (it could also help her if she has atyp-mycobacterium); we compromised rx w/ NEBS TID- followed by AdvairBid & Spiriva once daily, plus the MUCINEX1200mg Bid w/ fluids;  She again declines the added benefit from a vigorous chest PT regimen, flutter valve, postural drainage, purse lip breathing, etc... we plan a recheck in 3 months...    Past Medical History  Diagnosis Date  . Hypertension   . Diabetes mellitus without complication ([place])   . Asthma   . Ovarian cancer ([place])   . Fibromyalgia   . Osteoporosis   . Arthritis   . GERD (gastroesophageal reflux disease)   . History of chemotherapy   . History of fractured pelvis   . Osteoporosis     Past Surgical History  Procedure Laterality Date  . Hip surgey    . Knee surgey    . Lung surgery >> resection of nodule 2000, proved to be a benign granuloma    . Bladder suspension    . Ankle fracture surgery      plate and screws  . Abdominal hysterectomy  1996    Outpatient Encounter Prescriptions as of 02/19/2015   Medication Sig  . ADVAIR DISKUS 500-50 MCG/DOSE AEPB INHALE 1 PUFF BY MOUTH TWICE DAILY  . albuterol (PROVENTIL) (2.5 MG/3ML) 0.083% nebulizer solution Take 2.5 mg by nebulization daily.  . busPIRone (BUSPAR) 10 MG tablet Take 1 tablet (10 mg total) by mouth 2 (two) times daily.  . carvedilol (COREG) 6.25 MG tablet Take 6.25 mg by mouth 2 (two) times daily with a meal.  . denosumab (PROLIA) 60 MG/ML SOLN injection Inject 60 mg into the skin every 6 (six) months. Administer in upper arm, thigh,  or abdomen  . Dexlansoprazole 30 MG capsule Take 1 capsule (30 mg total) by mouth daily.  Marland Kitchen Dextromethorphan-Guaifenesin (MUCINEX DM MAXIMUM STRENGTH) 60-1200 MG TB12 Take 1 tablet by mouth 2 (two) times daily as needed (for cough/phlegm).  . DULoxetine (CYMBALTA) 60 MG capsule Take 60 mg by mouth daily.  . fluticasone (FLONASE) 50 MCG/ACT nasal spray Place 1-2 sprays into both nostrils daily as needed for allergies or rhinitis.  Marland Kitchen insulin aspart (NOVOLOG) 100 UNIT/ML injection Inject 4-6 Units into the skin 2 (two) times daily before a meal. Pens.  . insulin glargine (LANTUS) 100 UNIT/ML injection Inject 0.3 mLs (30 Units total) into the skin every morning. Pens  . Insulin Pen Needle 32G X 4 MM MISC Use 4x a day  . lansoprazole (PREVACID) 30 MG capsule Take 30 mg by mouth 2 (two) times daily.   . montelukast (SINGULAIR) 10 MG tablet Take 10 mg by mouth daily.   . NON FORMULARY 1 Syringe by Epidural route every 3 (three) months. Epidural Steroid Injections for Lumbar Spinal Stenosis up to 4 times a year  . oxyCODONE-acetaminophen (PERCOCET/ROXICET) 5-325 MG per tablet Take 1 tablet by mouth 2 (two) times daily as needed for severe pain.  . predniSONE (DELTASONE) 1 MG tablet Take 2 mg by mouth daily with breakfast.   . pregabalin (LYRICA) 300 MG capsule Take 1 capsule (300 mg total) by mouth 2 (two) times daily.  . ranitidine (ZANTAC) 150 MG tablet Take 150 mg by mouth 2 (two) times daily as needed for  heartburn.  . rosuvastatin (CRESTOR) 10 MG tablet Take 1 tablet (10 mg total) by mouth every evening.  . tiotropium (SPIRIVA) 18 MCG inhalation capsule Place 18 mcg into inhaler and inhale daily.  . traMADol (ULTRAM) 50 MG tablet 50 mg 2 (two) times daily.  Marland Kitchen zolpidem (AMBIEN) 5 MG tablet Take 1 tablet (5 mg total) by mouth at bedtime.  . [DISCONTINUED] fluticasone (FLONASE) 50 MCG/ACT nasal spray Place 1-2 sprays into both nostrils daily as needed for allergies or rhinitis.  . [DISCONTINUED] rosuvastatin (CRESTOR) 10 MG tablet Take 10 mg by mouth every evening.   . clindamycin (CLEOCIN) 150 MG capsule TK 4 CS PO 1 HOUR PRIOR TO APPOINTMENT  . levothyroxine (SYNTHROID, LEVOTHROID) 50 MCG tablet Take 1 tablet (50 mcg total) by mouth daily before breakfast. (Patient not taking: Reported on 02/19/2015)  . [EXPIRED] levalbuterol (XOPENEX) nebulizer solution 0.63 mg    No facility-administered encounter medications on file as of 02/19/2015.    Allergies  Allergen Reactions  . Latex Rash  . Penicillins Rash    Current Medications, Allergies, Past Medical History, Past Surgical History, Family History, and Social History were reviewed in [employer] record.   Review of Systems             All symptoms NEG except where BOLDED >>  Constitutional:  F/C/S, fatigue, anorexia, unexpected weight change. HEENT:  HA, visual changes, hearing loss, earache, nasal symptoms, sore throat, mouth sores, hoarseness. Resp:  cough, sputum, hemoptysis; SOB, tightness, wheezing. Cardio:  CP, palpit, DOE, orthopnea, edema. GI:  N/V/D/C, blood in stool; reflux, abd pain, distention, gas. GU:  dysuria, freq, urgency, hematuria, flank pain, voiding difficulty. MS:  joint pain, swelling, tenderness, decr ROM; neck pain, back pain, etc. Neuro:  HA, tremors, seizures, dizziness, syncope, weakness, numbness, gait abn. Skin:  suspicious lesions or skin rash. Heme:  adenopathy, bruising,  bleeding. Psyche:  confusion, agitation, sleep disturbance, hallucinations, anxiety, depression suicidal.  Objective:   Physical Exam       Vital Signs:  Reviewed...  General:  WD, WN, 79 y/o WF in NAD; alert & oriented; pleasant & cooperative... HEENT:  [place]/AT; Conjunctiva- pink, Sclera- nonicteric, EOM-wnl, PERRLA, EACs-clear, TMs-wnl; NOSE-clear; THROAT-clear & wnl. Neck:  Supple w/ fairROM; no JVD; normal carotid impulses w/o bruits; no thyromegaly or nodules palpated; no lymphadenopathy. Chest:  Decr BS bilat, few scat rhonchi otherw clear not congested no wheezing rales or signs of consolidation... Heart:  Regular Rhythm; norm S1 & S2 w/ Gr1/6 SEM w/o rubs or gallops detected... Abdomen:  Soft & nontender, +panniculus- no guarding or rebound; normal bowel sounds; no organomegaly or masses palpated. Ext:  decrROM; without deformities +arthritic changes; +venous insuffic, no edema;  Pulses intact w/o bruits. Neuro:  No focal neuro deficit; abn gait & balance poor, uses cane... Derm:  No lesions noted; no rash etc. Lymph:  No cervical, supraclavicular, axillary, or inguinal adenopathy palpated.   Assessment:      IMP >>  Complex 79 y/o woman:    Pulmonary> Hx COPD/ Bronchiectasis, Hx granulomatous lung dis & reported atyp mycobacterium in the past>  Hi-res CTChest 12/2014 showed extensive bronchiectasis bilat w/ mucoid impactions within dilated airways, 55mm LLL nodule, and tracheobronchomalacia w/ extensive air trapping on expiration;  We encouraged a regular bronchodilator regimen, Mucinex 600mg Qid, Fluids, Flutter/ postural drainage/ purse-lip breathing exercises, & check sput for cultures but she could not be bothered & refused further pulm treatment/ testing sput/ etc...     Cardiac> on Coreg6.25Bid for ?HBP; she has mild AS & DiastCHF on 2DEcho and this needs follow up w/ her PCP vs Cards...    Endocrine> IDDM, Hypothyroid, transiently elev serum calcium ?etiology (resolved spont);  managed by DrGherghe...    GYN> Hx ovarian cancer (in remission- details unknown), s/p hyst & Ca125=9 (08/2014)...    Ortho/ Rheum> DJD w/ TKR, FM on Lyrica, Tramadol, Percocet,etc, severe LBP requires epidural shots; PMR on Pred (details unknown); her Sed=52 and she will prob need Rheum to help sort this out...    Heme> Hg 10-11 range May2016 after fall w/ fx nose & wrist; similar now & may need further anemia work-up as well...  PLAN >>  We discussed Rx w/ ZPak for her URI (it could also help her if she has atyp-mycobacterium); we compromised rx w/ NEBS TID- followed by AdvairBid & Spiriva once daily, plus the MUCINEX1200mg Bid w/ fluids;  She again declines the added benefit from a vigorous chest PT regimen, flutter valve, postural drainage, purse lip breathing, etc... we plan a recheck in 3 months...     Plan:     Patient's Medications  New Prescriptions   AZITHROMYCIN (ZITHROMAX) 500 MG TABLET    Take 1 tablet (500 mg total) by mouth daily.  Previous Medications   ADVAIR DISKUS 500-50 MCG/DOSE AEPB    INHALE 1 PUFF BY MOUTH TWICE DAILY   ALBUTEROL (PROVENTIL) (2.5 MG/3ML) 0.083% NEBULIZER SOLUTION    Take 2.5 mg by nebulization daily.   BUSPIRONE (BUSPAR) 10 MG TABLET    Take 1 tablet (10 mg total) by mouth 2 (two) times daily.   CARVEDILOL (COREG) 6.25 MG TABLET    Take 6.25 mg by mouth 2 (two) times daily with a meal.   CLINDAMYCIN (CLEOCIN) 150 MG CAPSULE    TK 4 CS PO 1 HOUR PRIOR TO APPOINTMENT   DENOSUMAB (PROLIA) 60 MG/ML SOLN INJECTION    Inject 60 mg into the skin every 6 (six)  months. Administer in upper arm, thigh, or abdomen   DEXLANSOPRAZOLE 30 MG CAPSULE    Take 1 capsule (30 mg total) by mouth daily.   DEXTROMETHORPHAN-GUAIFENESIN (MUCINEX DM MAXIMUM STRENGTH) 60-1200 MG TB12    Take 1 tablet by mouth 2 (two) times daily as needed (for cough/phlegm).   DULOXETINE (CYMBALTA) 60 MG CAPSULE    Take 60 mg by mouth daily.   INSULIN ASPART (NOVOLOG) 100 UNIT/ML INJECTION    Inject  4-6 Units into the skin 2 (two) times daily before a meal. Pens.   INSULIN GLARGINE (LANTUS) 100 UNIT/ML INJECTION    Inject 0.3 mLs (30 Units total) into the skin every morning. Pens   INSULIN PEN NEEDLE 32G X 4 MM MISC    Use 4x a day   LANSOPRAZOLE (PREVACID) 30 MG CAPSULE    Take 30 mg by mouth 2 (two) times daily.    LEVOTHYROXINE (SYNTHROID, LEVOTHROID) 50 MCG TABLET    Take 1 tablet (50 mcg total) by mouth daily before breakfast.   MONTELUKAST (SINGULAIR) 10 MG TABLET    Take 10 mg by mouth daily.    NON FORMULARY    1 Syringe by Epidural route every 3 (three) months. Epidural Steroid Injections for Lumbar Spinal Stenosis up to 4 times a year   OXYCODONE-ACETAMINOPHEN (PERCOCET/ROXICET) 5-325 MG PER TABLET    Take 1 tablet by mouth 2 (two) times daily as needed for severe pain.   PREDNISONE (DELTASONE) 1 MG TABLET    Take 2 mg by mouth daily with breakfast.    PREGABALIN (LYRICA) 300 MG CAPSULE    Take 1 capsule (300 mg total) by mouth 2 (two) times daily.   RANITIDINE (ZANTAC) 150 MG TABLET    Take 150 mg by mouth 2 (two) times daily as needed for heartburn.   TIOTROPIUM (SPIRIVA) 18 MCG INHALATION CAPSULE    Place 18 mcg into inhaler and inhale daily.   TRAMADOL (ULTRAM) 50 MG TABLET    50 mg 2 (two) times daily.   ZOLPIDEM (AMBIEN) 5 MG TABLET    Take 1 tablet (5 mg total) by mouth at bedtime.  Modified Medications   Modified Medication Previous Medication   FLUTICASONE (FLONASE) 50 MCG/ACT NASAL SPRAY fluticasone (FLONASE) 50 MCG/ACT nasal spray      Place 1-2 sprays into both nostrils daily as needed for allergies or rhinitis.    Place 1-2 sprays into both nostrils daily as needed for allergies or rhinitis.   ROSUVASTATIN (CRESTOR) 10 MG TABLET rosuvastatin (CRESTOR) 10 MG tablet      Take 1 tablet (10 mg total) by mouth every evening.    Take 10 mg by mouth every evening.   Discontinued Medications   No medications on file

## 2015-02-21 ENCOUNTER — Ambulatory Visit (INDEPENDENT_AMBULATORY_CARE_PROVIDER_SITE_OTHER)
Admission: RE | Admit: 2015-02-21 | Discharge: 2015-02-21 | Disposition: A | Discharge status: Home / Self Care | Payer: Medicare Other | Source: Ambulatory Visit | Attending: Internal Medicine | Admitting: Internal Medicine

## 2015-02-21 ENCOUNTER — Encounter: Payer: Self-pay | Admitting: Internal Medicine

## 2015-02-21 ENCOUNTER — Ambulatory Visit (INDEPENDENT_AMBULATORY_CARE_PROVIDER_SITE_OTHER): Payer: Medicare Other | Admitting: Internal Medicine

## 2015-02-21 VITALS — BP 140/78 | HR 81 | Temp 98.5°F | Ht 60.0 in | Wt 156.0 lb

## 2015-02-21 DIAGNOSIS — E119 Type 2 diabetes mellitus without complications: Secondary | ICD-10-CM

## 2015-02-21 DIAGNOSIS — I10 Essential (primary) hypertension: Secondary | ICD-10-CM | POA: Diagnosis not present

## 2015-02-21 DIAGNOSIS — R05 Cough: Secondary | ICD-10-CM | POA: Diagnosis not present

## 2015-02-21 DIAGNOSIS — J209 Acute bronchitis, unspecified: Secondary | ICD-10-CM | POA: Insufficient documentation

## 2015-02-21 MED ORDER — SULFAMETHOXAZOLE-TRIMETHOPRIM 800-160 MG PO TABS: 1.0000 | ORAL_TABLET | Freq: Two times a day (BID) | ORAL | Status: DC

## 2015-02-21 MED ORDER — HYDROCODONE-HOMATROPINE 5-1.5 MG/5ML PO SYRP: 5.0000 mL | ORAL_SOLUTION | Freq: Four times a day (QID) | ORAL | Status: DC | PRN

## 2015-02-21 NOTE — Progress Notes (Signed)
Subjective:    Patient ID: Kayla Thompson, female    DOB: October 25, 1935, 79 y.o.   MRN: [id number]  HPI 79yo F with hx of bronchiectasis, here after grandkids visited for the holidays, has just seen Dr Lenna Gilford 11/30 and rx zpack but unfortunately could not tolerate due to GI upset; Here with acute onset milST, HA, general weakness and malaise, with prod cough greenish sputum, but Pt denies chest pain, increased sob or doe, wheezing, orthopnea, PND, increased LE swelling, palpitations, dizziness or syncope.  Past Medical History  Diagnosis Date  . Hypertension   . Diabetes mellitus without complication ([place])   . Asthma   . Ovarian cancer ([place])   . Fibromyalgia   . Osteoporosis   . Arthritis   . GERD (gastroesophageal reflux disease)   . History of chemotherapy   . History of fractured pelvis   . Osteoporosis    Past Surgical History  Procedure Laterality Date  . Hip surgey    . Knee surgey    . Lung surgery    . Bladder suspension    . Ankle fracture surgery      plate and screws  . Abdominal hysterectomy  1996    reports that she has been passively smoking.  She has never used smokeless tobacco. She reports that she does not drink alcohol. Her drug history is not on file. family history includes CAD in her father; Diabetes in her father, paternal aunt, paternal grandfather, paternal grandmother, and paternal uncle; Lung cancer in her maternal aunt and mother. Allergies  Allergen Reactions  . Latex Rash  . Penicillins Rash   Current Outpatient Prescriptions on File Prior to Visit  Medication Sig Dispense Refill  . ADVAIR DISKUS 500-50 MCG/DOSE AEPB INHALE 1 PUFF BY MOUTH TWICE DAILY 180 each 3  . albuterol (PROVENTIL) (2.5 MG/3ML) 0.083% nebulizer solution Take 2.5 mg by nebulization daily.    . busPIRone (BUSPAR) 10 MG tablet Take 1 tablet (10 mg total) by mouth 2 (two) times daily. 60 tablet 2  . carvedilol (COREG) 6.25 MG tablet Take 6.25 mg by mouth 2 (two) times daily with a  meal.    . clindamycin (CLEOCIN) 150 MG capsule TK 4 CS PO 1 HOUR PRIOR TO APPOINTMENT  0  . denosumab (PROLIA) 60 MG/ML SOLN injection Inject 60 mg into the skin every 6 (six) months. Administer in upper arm, thigh, or abdomen    . Dexlansoprazole 30 MG capsule Take 1 capsule (30 mg total) by mouth daily. 90 capsule 3  . Dextromethorphan-Guaifenesin (MUCINEX DM MAXIMUM STRENGTH) 60-1200 MG TB12 Take 1 tablet by mouth 2 (two) times daily as needed (for cough/phlegm).    . DULoxetine (CYMBALTA) 60 MG capsule Take 60 mg by mouth daily.    . fluticasone (FLONASE) 50 MCG/ACT nasal spray Place 1-2 sprays into both nostrils daily as needed for allergies or rhinitis. 16 g 6  . insulin aspart (NOVOLOG) 100 UNIT/ML injection Inject 4-6 Units into the skin 2 (two) times daily before a meal. Pens. 15 mL 2  . insulin glargine (LANTUS) 100 UNIT/ML injection Inject 0.3 mLs (30 Units total) into the skin every morning. Pens 15 mL 2  . Insulin Pen Needle 32G X 4 MM MISC Use 4x a day 360 each 1  . lansoprazole (PREVACID) 30 MG capsule Take 30 mg by mouth 2 (two) times daily.     Marland Kitchen levothyroxine (SYNTHROID, LEVOTHROID) 50 MCG tablet Take 1 tablet (50 mcg total) by mouth daily before breakfast.  30 tablet 3  . montelukast (SINGULAIR) 10 MG tablet Take 10 mg by mouth daily.     . NON FORMULARY 1 Syringe by Epidural route every 3 (three) months. Epidural Steroid Injections for Lumbar Spinal Stenosis up to 4 times a year    . oxyCODONE-acetaminophen (PERCOCET/ROXICET) 5-325 MG per tablet Take 1 tablet by mouth 2 (two) times daily as needed for severe pain. 45 tablet 0  . predniSONE (DELTASONE) 1 MG tablet Take 2 mg by mouth daily with breakfast.     . pregabalin (LYRICA) 300 MG capsule Take 1 capsule (300 mg total) by mouth 2 (two) times daily. 180 capsule 1  . ranitidine (ZANTAC) 150 MG tablet Take 150 mg by mouth 2 (two) times daily as needed for heartburn.    . rosuvastatin (CRESTOR) 10 MG tablet Take 1 tablet (10 mg  total) by mouth every evening. 30 tablet 2  . tiotropium (SPIRIVA) 18 MCG inhalation capsule Place 18 mcg into inhaler and inhale daily.    . traMADol (ULTRAM) 50 MG tablet 50 mg 2 (two) times daily.    Marland Kitchen zolpidem (AMBIEN) 5 MG tablet Take 1 tablet (5 mg total) by mouth at bedtime. 30 tablet 2  . azithromycin (ZITHROMAX) 500 MG tablet Take 1 tablet (500 mg total) by mouth daily. (Patient not taking: Reported on 02/21/2015) 5 tablet 0   No current facility-administered medications on file prior to visit.   Review of Systems  Constitutional: Negative for unusual diaphoresis or night sweats HENT: Negative for ringing in ear or discharge Eyes: Negative for double vision or worsening visual disturbance.  Respiratory: Negative for choking and stridor.   Gastrointestinal: Negative for vomiting or other signifcant bowel change Genitourinary: Negative for hematuria or change in urine volume.  Musculoskeletal: Negative for other MSK pain or swelling Skin: Negative for color change and worsening wound.  Neurological: Negative for tremors and numbness other than noted  Psychiatric/Behavioral: Negative for decreased concentration or agitation other than above       Objective:   Physical Exam BP 140/78 mmHg  Pulse 81  Temp(Src) 98.5 F (36.9 C) (Oral)  Ht 5' (1.524 m)  Wt 156 lb (70.761 kg)  BMI 30.47 kg/m2  SpO2 95% VS noted,  Constitutional: Pt appears in no significant distress HENT: Head: NCAT.  Right Ear: External ear normal.  Left Ear: External ear normal.  Bilat tm's with mild erythema.  Max sinus areas non tender.  Pharynx with mild erythema, no exudate Eyes: . Pupils are equal, round, and reactive to light. Conjunctivae and EOM are normal Neck: Normal range of motion. Neck supple.  Cardiovascular: Normal rate and regular rhythm.   Pulmonary/Chest: Effort normal and breath sounds decreased bilat but without rales or wheezing.  Abd:  Soft, NT, ND, + BS Neurological: Pt is alert. Not  confused , motor grossly intact Skin: Skin is warm. No rash, no LE edema Psychiatric: Pt behavior is normal. No agitation.   CLINICAL DATA: Dyspnea. Productive cough. Nonsmoker EXAM: CT CHEST WITHOUT CONTRAST  TECHNIQUE: Multidetector CT imaging of the chest was performed following the standard protocol without intravenous contrast. High resolution imaging of the lungs, as well as inspiratory and expiratory imaging, was performed.  COMPARISON: 01/07/2015 chest radiograph. No prior chest CT.  FINDINGS: The routine volumetric sequence is significantly motion degraded.  Mediastinum/Nodes: Normal heart size. No pericardial fluid/thickening. There is atherosclerosis of the thoracic aorta, the great vessels of the mediastinum and the coronary arteries, including calcified atherosclerotic plaque in the  left anterior descending and right coronary arteries. Great vessels are normal in course and caliber. The thyroid is either atrophic or surgically absent. Normal esophagus. No axillary, mediastinal or gross hilar lymphadenopathy, noting limited sensitivity for the detection of hilar adenopathy on this noncontrast study.  Lungs/Pleura: No pneumothorax. No pleural effusion. There is a small sliding pulmonary hernia between the lateral right seventh and eighth ribs containing a small portion of the lateral right middle and right lower lobes (series 5/ image 31). There are associated peripheral parenchymal bands in the right middle and lower lobes with distortion at the areas of lung herniation, indicating scarring. No acute consolidative airspace disease. There is extensive mild cylindrical and varicoid bronchiectasis throughout both lungs. There is extensive patchy tree-in-bud opacity and centrilobular nodularity throughout both lungs, with much of the centrilobular nodularity likely representing mucoid impaction within dilated airways, as seen by a branching pattern with many  of the nodules (for example series 5/image 15 and series 602/image 20). The dominant pulmonary nodule measures 0.7 cm in the anterior left lower lobe (5/32). There is tracheobronchomalacia, which moderately involves the trachea (approximately 70% loss of AP tracheal height and cross-sectional area on expiration compared to inspiration), moderately involves the main and lobar bronchi bilaterally and severely involves the segmental and subsegmental bronchi, which demonstrate near complete collapse on expiration (for example as seen in the lower lobes on series 4/image 8). There is associated extensive air trapping throughout both lungs on the expiration sequence. There are no significant regions of traction bronchiectasis or frank honeycombing.  Upper abdomen: Small hiatal hernia.  Musculoskeletal: No aggressive appearing focal osseous lesions. Healed deformity in the right posterior sixth and seventh ribs. Marked degenerative changes in the thoracic spine.  IMPRESSION: 1. Extensive mild cylindrical and varicoid bronchiectasis throughout both lungs. 2. Extensive patchy tree-in-bud opacity and centrilobular nodularity throughout both lungs, most in keeping with infectious/inflammatory bronchiolitis (likely atypical mycobacterial infection (MAI) given the associated bronchiectasis) and mucus plugging. 3. Tracheobronchomalacia, which severely involves segmental and subsegmental bronchi, with extensive air trapping. 4. Small sliding pulmonary hernia between the lateral right seventh and eighth ribs. 5. Atherosclerosis, including two-vessel coronary artery disease. Please note that although the presence of coronary artery calcium documents the presence of coronary artery disease, the severity of this disease and any potential stenosis cannot be assessed on this non-gated CT examination. 6. Small hiatal hernia.   Electronically Signed  By: Ilona Sorrel M.D.  On: 01/14/2015  08:55     Assessment & Plan:

## 2015-02-21 NOTE — Patient Instructions (Addendum)
Please take all new medication as prescribed - the septra DS  Please continue all other medications as before, and refills have been done if requested.  Please have the pharmacy call with any other refills you may need.  Please continue your efforts at being more active, low cholesterol diet, and weight control.  Please keep your appointments with your specialists as you may have planned  Please go to the XRAY Department in the Basement (go straight as you get off the elevator) for the x-ray testing  You will be contacted by phone if any changes need to be made immediately.  Otherwise, you will receive a letter about your results with an explanation, but please check with MyChart first.  Please remember to sign up for MyChart if you have not done so, as this will be important to you in the future with finding out test results, communicating by private email, and scheduling acute appointments online when needed.

## 2015-02-21 NOTE — Assessment & Plan Note (Signed)
stable overall by history and exam, recent data reviewed with pt, and pt to continue medical treatment as before,  to f/u any worsening symptoms or concerns Lab Results  Component Value Date   HGBA1C 6.5 01/28/2015   Pt to call for onset polys or cbg > 200

## 2015-02-21 NOTE — Progress Notes (Signed)
Pre visit review using our clinic review tool, if applicable. No additional management support is needed unless otherwise documented below in the visit note. 

## 2015-02-21 NOTE — Assessment & Plan Note (Signed)
stable overall by history and exam, recent data reviewed with pt, and pt to continue medical treatment as before,  to f/u any worsening symptoms or concerns BP Readings from Last 3 Encounters:  02/21/15 140/78  02/19/15 128/78  01/28/15 110/68

## 2015-02-21 NOTE — Assessment & Plan Note (Signed)
Mild to mod, not hypoxic, suspect possible viral related to family sick contacts, but cant r/o bronchiectasis flare, cannot tolerate zpack, for bactrim course,  For cxr, to f/u any worsening symptoms or concerns

## 2015-02-24 ENCOUNTER — Ambulatory Visit: Payer: Medicare Other | Admitting: Internal Medicine

## 2015-02-25 ENCOUNTER — Encounter: Payer: Self-pay | Admitting: Pulmonary Disease

## 2015-02-27 DIAGNOSIS — F4323 Adjustment disorder with mixed anxiety and depressed mood: Secondary | ICD-10-CM | POA: Diagnosis not present

## 2015-02-28 ENCOUNTER — Telehealth: Payer: Self-pay | Admitting: Internal Medicine

## 2015-02-28 NOTE — Telephone Encounter (Signed)
Patient Name: Kayla Thompson DOB: 1935/07/11 Initial Comment Caller states she has a twitching in her hands in face. It started happening recently. Is on buspar, it helps. Would like something stronger. Nurse Assessment Nurse: Ronnald Ramp, RN, Miranda Date/Time (Eastern Time): 02/28/2015 11:30:30 AM Confirm and document reason for call. If symptomatic, describe symptoms. ---Caller states she was taking Xanex and recently changed to Buspar 4 months ago. About 1 week ago she has noticed twitching in her hands, neck, and face. She denies any weakness or numbness. No pain associated with the twitching and the twitching is not isolated to 1 side. Has the patient traveled out of the country within the last 30 days? ---Not Applicable Does the patient have any new or worsening symptoms? ---Yes Will a triage be completed? ---Yes Related visit to physician within the last 2 weeks? ---No Does the PT have any chronic conditions? (i.e. diabetes, asthma, etc.) ---Yes List chronic conditions. ---Diabetes, Asthma, Fibromyalgia. Is this a behavioral health or substance abuse call? ---No Guidelines Guideline Title Affirmed Question Affirmed Notes Final Disposition User Clinical Call Ronnald Ramp, RN, Miranda Comments No appropriate guideline available. Told caller she needs to be seen by MD within 2 weeks for evaluation. Appt scheduled with PCP, Pricilla Holm on Monday 12/12 at 4:15pm.

## 2015-03-03 ENCOUNTER — Other Ambulatory Visit (INDEPENDENT_AMBULATORY_CARE_PROVIDER_SITE_OTHER): Payer: Medicare Other

## 2015-03-03 ENCOUNTER — Ambulatory Visit (INDEPENDENT_AMBULATORY_CARE_PROVIDER_SITE_OTHER): Payer: Medicare Other | Admitting: Internal Medicine

## 2015-03-03 ENCOUNTER — Encounter: Payer: Self-pay | Admitting: Internal Medicine

## 2015-03-03 VITALS — BP 130/60 | HR 81 | Temp 98.5°F | Resp 18 | Ht 60.0 in | Wt 160.4 lb

## 2015-03-03 DIAGNOSIS — M62838 Other muscle spasm: Secondary | ICD-10-CM | POA: Diagnosis not present

## 2015-03-03 DIAGNOSIS — G252 Other specified forms of tremor: Secondary | ICD-10-CM

## 2015-03-03 DIAGNOSIS — E538 Deficiency of other specified B group vitamins: Secondary | ICD-10-CM

## 2015-03-03 LAB — CK: Total CK: 57 U/L (ref 7–177)

## 2015-03-03 LAB — COMPREHENSIVE METABOLIC PANEL
ALT: 26 U/L (ref 0–35)
AST: 18 U/L (ref 0–37)
Albumin: 3.8 g/dL (ref 3.5–5.2)
Alkaline Phosphatase: 61 U/L (ref 39–117)
BUN: 13 mg/dL (ref 6–23)
CO2: 31 mEq/L (ref 19–32)
Calcium: 9.4 mg/dL (ref 8.4–10.5)
Chloride: 103 mEq/L (ref 96–112)
Creatinine, Ser: 0.88 mg/dL (ref 0.40–1.20)
GFR: 66.1 mL/min (ref >60.00)
Glucose, Bld: 68 mg/dL — ABNORMAL LOW (ref 70–99)
Potassium: 4 mEq/L (ref 3.5–5.1)
Sodium: 141 mEq/L (ref 135–145)
Total Bilirubin: 0.3 mg/dL (ref 0.2–1.2)
Total Protein: 6.6 g/dL (ref 6.0–8.3)

## 2015-03-03 LAB — T4, FREE: Free T4: 0.69 ng/dL (ref 0.60–1.60)

## 2015-03-03 LAB — VITAMIN B12: Vitamin B-12: >1500 pg/mL — ABNORMAL HIGH (ref 211–911)

## 2015-03-03 LAB — MAGNESIUM: Magnesium: 1.9 mg/dL (ref 1.5–2.5)

## 2015-03-03 LAB — TSH: TSH: 0.36 u[IU]/mL (ref 0.35–4.50)

## 2015-03-03 MED ORDER — OXYCODONE-ACETAMINOPHEN 5-325 MG PO TABS: 1.0000 | ORAL_TABLET | Freq: Two times a day (BID) | ORAL | Status: DC | PRN

## 2015-03-03 MED ORDER — DONEPEZIL HCL 5 MG PO TABS: 5.0000 mg | ORAL_TABLET | Freq: Every day | ORAL | Status: DC

## 2015-03-03 NOTE — Progress Notes (Signed)
Pre visit review using our clinic review tool, if applicable. No additional management support is needed unless otherwise documented below in the visit note. 

## 2015-03-03 NOTE — Patient Instructions (Signed)
We are checking some blood tests today to see if we can find a good cause for the tremors.   If we cannot find a good cause and they continue we may have you see the nerve specialist.   We have sent in the medicine for the memory called aricept which helps to preserve memory.   Management of Memory Problems  There are some general things you can do to help manage your memory problems.  Your memory may not in fact recover, but by using techniques and strategies you will be able to manage your memory difficulties better.  1)  Establish a routine.  Try to establish and then stick to a regular routine.  By doing this, you will get used to what to expect and you will reduce the need to rely on your memory.  Also, try to do things at the same time of day, such as taking your medication or checking your calendar first thing in the morning.  Think about think that you can do as a part of a regular routine and make a list.  Then enter them into a daily planner to remind you.  This will help you establish a routine.  2)  Organize your environment.  Organize your environment so that it is uncluttered.  Decrease visual stimulation.  Place everyday items such as keys or cell phone in the same place every day (ie.  Basket next to front door)  Use post it notes with a brief message to yourself (ie. Turn off light, lock the door)  Use labels to indicate where things go (ie. Which cupboards are for food, dishes, etc.)  Keep a notepad and pen by the telephone to take messages  3)  Memory Aids  A diary or journal/notebook/daily planner  Making a list (shopping list, chore list, to do list that needs to be done)  Using an alarm as a reminder (kitchen timer or cell phone alarm)  Using cell phone to store information (Notes, Calendar, Reminders)  Calendar/White board placed in a prominent position  Post-it notes  In order for memory aids to be useful, you need to have good habits.  It's no good  remembering to make a note in your journal if you don't remember to look in it.  Try setting aside a certain time of day to look in journal.  4)  Improving mood and managing fatigue.  There may be other factors that contribute to memory difficulties.  Factors, such as anxiety, depression and tiredness can affect memory.  Regular gentle exercise can help improve your mood and give you more energy.  Simple relaxation techniques may help relieve symptoms of anxiety  Try to get back to completing activities or hobbies you enjoyed doing in the past.  Learn to pace yourself through activities to decrease fatigue.  Find out about some local support groups where you can share experiences with others.  Try and achieve 7-8 hours of sleep at night.

## 2015-03-04 LAB — SEDIMENTATION RATE: Sed Rate: 43 mm/hr — ABNORMAL HIGH (ref 0–22)

## 2015-03-06 ENCOUNTER — Telehealth: Payer: Self-pay | Admitting: Internal Medicine

## 2015-03-06 DIAGNOSIS — R252 Cramp and spasm: Secondary | ICD-10-CM | POA: Insufficient documentation

## 2015-03-06 NOTE — Assessment & Plan Note (Signed)
Checking thyroid levels to ensure this is not toxicity. Also checking CMP and ESR for prior history of PMR. No medicine today and if worsening she will call back.

## 2015-03-06 NOTE — Progress Notes (Signed)
   Subjective:    Patient ID: Kayla Thompson, female    DOB: Mar 25, 1935, 79 y.o.   MRN: [id number]  HPI The patient is a 79 YO female coming in for muscle spasms. Started about 1 week ago and have stayed about the same. Has not tried anything for them. No recent medicine changes to explain. No recent extra physical activity that could have caused the spasms. Prior history of PMR and she does not feel that this was like those symptoms.   Review of Systems  Constitutional: Positive for activity change. Negative for fever, appetite change, fatigue and unexpected weight change.  Respiratory: Negative for cough, chest tightness, shortness of breath and wheezing.   Cardiovascular: Negative for chest pain, palpitations and leg swelling.  Gastrointestinal: Negative for abdominal pain, diarrhea, constipation and abdominal distention.  Musculoskeletal: Positive for myalgias, back pain and arthralgias.  Skin: Negative.   Neurological: Negative.   Psychiatric/Behavioral: Negative for suicidal ideas, sleep disturbance, self-injury, dysphoric mood and decreased concentration. The patient is not nervous/anxious.       Objective:   Physical Exam  Constitutional: She is oriented to person, place, and time. She appears well-developed and well-nourished.  Overweight  HENT:  Head: Normocephalic and atraumatic.  Eyes: EOM are normal.  Neck: Normal range of motion.  Cardiovascular: Normal rate and regular rhythm.   Pulmonary/Chest: Effort normal and breath sounds normal.  Abdominal: Soft. Bowel sounds are normal. She exhibits no distension. There is no tenderness. There is no rebound.  Musculoskeletal: She exhibits tenderness.  No spasms or twitching on exam.   Neurological: She is alert and oriented to person, place, and time. Coordination normal.  Skin: Skin is warm and dry.  Psychiatric: She has a normal mood and affect.   Filed Vitals:   03/03/15 1614  BP: 130/60  Pulse: 81  Temp: 98.5 F (36.9  C)  TempSrc: Oral  Resp: 18  Height: 5' (1.524 m)  Weight: 160 lb 6.4 oz (72.757 kg)  SpO2: 91%      Assessment & Plan:

## 2015-03-10 ENCOUNTER — Encounter: Payer: Self-pay | Admitting: Internal Medicine

## 2015-03-10 ENCOUNTER — Ambulatory Visit (INDEPENDENT_AMBULATORY_CARE_PROVIDER_SITE_OTHER): Payer: Medicare Other | Admitting: Internal Medicine

## 2015-03-10 VITALS — BP 120/76 | HR 88 | Temp 97.9°F | Resp 20 | Wt 160.0 lb

## 2015-03-10 DIAGNOSIS — Z794 Long term (current) use of insulin: Secondary | ICD-10-CM

## 2015-03-10 DIAGNOSIS — E213 Hyperparathyroidism, unspecified: Secondary | ICD-10-CM | POA: Diagnosis not present

## 2015-03-10 DIAGNOSIS — E1165 Type 2 diabetes mellitus with hyperglycemia: Secondary | ICD-10-CM | POA: Diagnosis not present

## 2015-03-10 MED ORDER — INSULIN GLARGINE 100 UNIT/ML ~~LOC~~ SOLN: 25.0000 [IU] | Freq: Every morning | SUBCUTANEOUS | Status: DC

## 2015-03-10 NOTE — Patient Instructions (Signed)
Please bring the urine collection to the lab.  Please decrease the Lantus to 25 units in am. Continue Novolog 4-6 units 2x a day before meals.  Check sugars 2-3x a day, usually before meals and at bedtime.  Please return in 3 months with your sugar log.   Patient information (Up-to-Date): Collection of a 24-hour urine specimen  - You should collect every drop of urine during each 24-hour period. It does not matter how much or little urine is passed each time, as long as every drop is collected. - Begin the urine collection in the morning after you wake up, after you have emptied your bladder for the first time. - Urinate (empty the bladder) for the first time and flush it down the toilet. Note the exact time (eg, 6:15 AM). You will begin the urine collection at this time. - Collect every drop of urine during the day and night in an empty collection bottle. Store the bottle at room temperature or in the refrigerator. - If you need to have a bowel movement, any urine passed with the bowel movement should be collected. Try not to include feces with the urine collection. If feces does get mixed in, do not try to remove the feces from the urine collection bottle. - Finish by collecting the first urine passed the next morning, adding it to the collection bottle. This should be within ten minutes before or after the time of the first morning void on the first day (which was flushed). In this example, you would try to void between 6:05 and 6:25 on the second day. - If you need to urinate one hour before the final collection time, drink a full glass of water so that you can void again at the appropriate time. If you have to urinate 20 minutes before, try to hold the urine until the proper time. - Please note the exact time of the final collection, even if it is not the same time as when collection began on day 1. - The bottle(s) may be kept at room temperature for a day or two, but should be kept cool or  refrigerated for longer periods of time.

## 2015-03-10 NOTE — Progress Notes (Signed)
Patient ID: Kayla Thompson, female   DOB: [birth year], 79 y.o.   MRN: [id number]   HPI  Kayla Thompson is a 79 y.o.-year-old female, initially referred by her OB/GYN doctor, Dr. Uvaldo Rising, for evaluation for hyperparathyroidism + normo/hypo-calcemia and DM2, dx 2010, insulin-dependent since 2010, controlled, w/o complications. Last visit 1.5 mo ago.  Reviewed and addended hx: Pt was dx with hyperparathyroidism in 08/2014.Pertinent labs: Lab Results  Component Value Date   PTH 73* 10/28/2014   PTH Comment 10/28/2014   PTH 190* 09/10/2014   PTH 243* 08/23/2014   CALCIUM 9.4 03/03/2015   CALCIUM 9.7 01/07/2015   CALCIUM 8.7 10/28/2014   CALCIUM 9.2 08/23/2014   CALCIUM 8.0* 08/02/2014   Of note, patient has osteoporosis, dx in 2007.  I reviewed pt's DEXA scans - last from 07/25/2014: Date L1-L2 (L3 and L4 excluded) T score FN T score  07/25/2014   -0.1  LFN: -2.7   She had multiple fractures: - 09/23/1990: pelvic fx - 02/2005: R hip fx - rod - 05/2006: rib fx - 12/2008: 3 R ankle fx - in [place] - plate and screws - 075-GRM: wrist fx's 01/2012: steroid inj's R knee - 07/2014: forearm fx and nasal bone fx  She started Prolia 3 or 4 years ago - last inj 07/2014.  No h/o kidney stones.  No h/o CKD. Last BUN/Cr: Lab Results  Component Value Date   BUN 13 03/03/2015   CREATININE 0.88 03/03/2015   Pt is not on HCTZ.  No h/o vitamin D deficiency. Last vit D level was   - 01/28/2015: 65.6 - 08/23/2014: 48  Pt is on calcium (caltrate) 600 mg 2x a day and vitamin D 4000 IU daily; she also eats dairy and green, leafy, vegetables - but not lately during the move.  Had R knee replacement.  Pt does not have a FH of hypercalcemia, pituitary tumors, thyroid cancer, or osteoporosis.   Labs point towards secondary HPTH due to malabsorption of Ca or hyperexcretion of calcium: Component     Latest Ref Rng 10/28/2014 10/28/2014 11/08/2014         1:51 PM  1:51 PM   Calcium     8.7 -  10.3 mg/dL  8.7   PTH     15 - 65 pg/mL  73 (H)   Vitamin D 1, 25 (OH) Total     18 - 72 pg/mL   52  Vitamin D3 1, 25 (OH)        52  Vitamin D2 1, 25 (OH)        <8  Calcium Ionized     1.12 - 1.32 mmol/L 1.21    Magnesium     1.5 - 2.5 mg/dL   2.1  Phosphorus     2.3 - 4.6 mg/dL   3.8   At last visit, I advised her to bring a 24h urine collection to check urinary calcium. She did not bring this yet.  She also has DM2: She was dx in 2010. She started insulin in 2010.  Lab Results  Component Value Date   HGBA1C 6.5 01/28/2015   HGBA1C 6.2 10/28/2014   She is on: - Lantus 30 units in am - Novolog 4-6 units 2x a day  She brings a log: - am: 76-122, 157 >> 67, 77-137, 155 >> 61-119, 140 - after lunch: 120 >> 116-154 - after dinner: 132 - bedtime: 142, 228 >> 97, 116  She skipped b'fast one am >> 46 before lunch.   -  Latest lipids:  Lab Results  Component Value Date   CHOL 161 01/28/2015   HDL 73.70 01/28/2015   LDLCALC 76 01/28/2015   TRIG 56.0 01/28/2015   CHOLHDL 2 01/28/2015  On Crestor 10 mg daily.  Latest BUN/Cr: Lab Results  Component Value Date   BUN 13 03/03/2015   Lab Results  Component Value Date   CREATININE 0.88 03/03/2015   Last dilated eye exam: 06/2014 (in [place]) >> no DR.  She also has a history of Ovarian cancer - 1996, urinary incontinence, HTN, HL, hypothyroidism: Lab Results  Component Value Date   TSH 0.36 03/03/2015   ROS: Constitutional: + weight gain, + fatigue, no subjective hyperthermia/hypothermia, + increased urination Eyes: + blurry vision, no xerophthalmia ENT: no sore throat, no nodules palpated in throat, no dysphagia/odynophagia, no hoarseness Cardiovascular: no CP/+ SOB/+ palpitations/+ leg swelling R>L (metal plate) Respiratory: + cough/+ SOB Gastrointestinal: no N/V/D/+ C Musculoskeletal: no muscle/joint aches Skin: no rashes Neurological:+ tremors/no numbness/tingling/dizziness  I reviewed pt's  medications, allergies, PMH, social hx, family hx, and changes were documented in the history of present illness. Otherwise, unchanged from my initial visit note. She staopped Buspar and started Aricept.  Past Medical History  Diagnosis Date  . Hypertension   . Diabetes mellitus without complication ([place])   . Asthma   . Ovarian cancer ([place])   . Fibromyalgia   . Osteoporosis   . Arthritis   . GERD (gastroesophageal reflux disease)   . History of chemotherapy   . History of fractured pelvis   . Osteoporosis    Past Surgical History  Procedure Laterality Date  . Hip surgey    . Knee surgey    . Lung surgery    . Bladder suspension    . Ankle fracture surgery      plate and screws  . Abdominal hysterectomy  1996   History   Social History  . Marital Status: Widowed    Spouse Name: N/A  . Number of Children: 2 (1 adopted)   Occupational History  . retired   Social History Main Topics  . Smoking status: Never Smoker   . Smokeless tobacco: Never Used  . Alcohol Use: No  . Drug Use: No   Current Outpatient Prescriptions on File Prior to Visit  Medication Sig Dispense Refill  . ADVAIR DISKUS 500-50 MCG/DOSE AEPB INHALE 1 PUFF BY MOUTH TWICE DAILY 180 each 3  . albuterol (PROVENTIL) (2.5 MG/3ML) 0.083% nebulizer solution Take 2.5 mg by nebulization daily.    . carvedilol (COREG) 6.25 MG tablet Take 6.25 mg by mouth 2 (two) times daily with a meal.    . clindamycin (CLEOCIN) 150 MG capsule TK 4 CS PO 1 HOUR PRIOR TO APPOINTMENT  0  . denosumab (PROLIA) 60 MG/ML SOLN injection Inject 60 mg into the skin every 6 (six) months. Administer in upper arm, thigh, or abdomen    . Dexlansoprazole 30 MG capsule Take 1 capsule (30 mg total) by mouth daily. 90 capsule 3  . Dextromethorphan-Guaifenesin (MUCINEX DM MAXIMUM STRENGTH) 60-1200 MG TB12 Take 1 tablet by mouth 2 (two) times daily as needed (for cough/phlegm).    Marland Kitchen donepezil (ARICEPT) 5 MG tablet Take 1 tablet (5 mg total) by mouth  at bedtime. 30 tablet 3  . DULoxetine (CYMBALTA) 60 MG capsule Take 60 mg by mouth daily.    . fluticasone (FLONASE) 50 MCG/ACT nasal spray Place 1-2 sprays into both nostrils daily as needed for allergies or rhinitis. 16 g 6  .  insulin aspart (NOVOLOG) 100 UNIT/ML injection Inject 4-6 Units into the skin 2 (two) times daily before a meal. Pens. 15 mL 2  . insulin glargine (LANTUS) 100 UNIT/ML injection Inject 0.3 mLs (30 Units total) into the skin every morning. Pens 15 mL 2  . Insulin Pen Needle 32G X 4 MM MISC Use 4x a day 360 each 1  . lansoprazole (PREVACID) 30 MG capsule Take 30 mg by mouth 2 (two) times daily.     Marland Kitchen levothyroxine (SYNTHROID, LEVOTHROID) 50 MCG tablet Take 1 tablet (50 mcg total) by mouth daily before breakfast. 30 tablet 3  . montelukast (SINGULAIR) 10 MG tablet Take 10 mg by mouth daily.     . NON FORMULARY 1 Syringe by Epidural route every 3 (three) months. Epidural Steroid Injections for Lumbar Spinal Stenosis up to 4 times a year    . oxyCODONE-acetaminophen (PERCOCET/ROXICET) 5-325 MG tablet Take 1 tablet by mouth 2 (two) times daily as needed for severe pain. 45 tablet 0  . predniSONE (DELTASONE) 1 MG tablet Take 2 mg by mouth daily with breakfast.     . pregabalin (LYRICA) 300 MG capsule Take 1 capsule (300 mg total) by mouth 2 (two) times daily. 180 capsule 1  . ranitidine (ZANTAC) 150 MG tablet Take 150 mg by mouth 2 (two) times daily as needed for heartburn.    . rosuvastatin (CRESTOR) 10 MG tablet Take 1 tablet (10 mg total) by mouth every evening. 30 tablet 2  . sulfamethoxazole-trimethoprim (BACTRIM DS,SEPTRA DS) 800-160 MG tablet Take 1 tablet by mouth 2 (two) times daily. 20 tablet 0  . tiotropium (SPIRIVA) 18 MCG inhalation capsule Place 18 mcg into inhaler and inhale daily.    . traMADol (ULTRAM) 50 MG tablet 50 mg 2 (two) times daily.    Marland Kitchen zolpidem (AMBIEN) 5 MG tablet Take 1 tablet (5 mg total) by mouth at bedtime. 30 tablet 2   No current  facility-administered medications on file prior to visit.   Allergies  Allergen Reactions  . Latex Rash  . Penicillins Rash   Family History  Problem Relation Age of Onset  . Lung cancer Mother   . CAD Father   . Diabetes Father   . Lung cancer Maternal Aunt   . Diabetes Paternal Aunt   . Diabetes Paternal Uncle   . Diabetes Paternal Grandmother   . Diabetes Paternal Grandfather    PE: BP 120/76 mmHg  Pulse 88  Temp(Src) 97.9 F (36.6 C)  Resp 20  Wt 160 lb (72.576 kg)  SpO2 96% Body mass index is 31.25 kg/(m^2). Wt Readings from Last 3 Encounters:  03/10/15 160 lb (72.576 kg)  03/03/15 160 lb 6.4 oz (72.757 kg)  02/21/15 156 lb (70.761 kg)   Constitutional: Normal weight, in NAD. No kyphosis. Eyes: PERRLA, EOMI, no exophthalmos ENT: moist mucous membranes, no thyromegaly, no cervical lymphadenopathy Cardiovascular: RRR, No MRG Respiratory: CTA B Gastrointestinal: abdomen soft, NT, ND, BS+ Musculoskeletal: no deformities, strength intact in all 4 Skin: moist, warm, no rashes Neurological: no tremor with outstretched hands, DTR normal in all 4  Assessment: 1. Hyperparathyroidism - Hypo-/normocalcemia   2. DM2, insulin-dependent, controlled, without complications  Plan: 1. Carmen - Patient has had 3 instances of a high parathyroid hormone, in the setting of initially a low calcium (and low ionized calcium) and then normal calcium. Both set of labs were drawn apparently after Prolia infusion, which can lower calcium levels. She does not have a history of hypercalcemia. She is  taking calcium supplements, 600 mg 2x a day (Caltrate). Of note, patient has normal vitamin D levels. - Her parathyroid hormone is decreasing, and her calcium is low normal. At last visit, I advised her to do the urine collection to see if her 24-hour urine calcium is low. In this case, her elevated PTH is likely secondary to low calcium intake/absorption. To help with this, we can switch to calcium  citrate and increase her calcium dose. We also make sure that her vitamin D remains normal. Alternatively, if her urine calcium is high, I suspect that the high PTH is secondary to hypercalciuria, rather than hypercalciuria being a consequence of high PTH. In this case, we may try a low-dose HCTZ. She did not bring the collection yet but will do this soon >> given again written instructions. - I doubt that she has primary hyperparathyroidism, however this is a possibility. At last visit, I discussed with the patient about consequences and the possibility of surgery. She absolutely refused surgery.  2. DM2 - Patient with long-standing, on basal bolus insulin regimen, with fairly good control, however, with some sugars in the 60s, 70s in am >> will decrease Lantus to 25 units. Also, she had a low CBG episode after she took am Novolog and skipped b'fast >> sugars later 46. Discussed correct insulin adm and that she should not skip meals, but if she does, she needs to also skip Novolog. - I advised her to  Patient Instructions  Please bring the urine collection to the lab.  Please decrease the Lantus to 25 units in am. Continue Novolog 4-6 units 2x a day before meals.  Check sugars 2-3x a day, usually before meals and at bedtime.  Please return in 3 months with your sugar log.   - continue checking sugars at different times of the day - check 2-3 times a day, rotating checks - advised for yearly eye exams >> she is UTD - reviewed last hemoglobin A1c - great, at 6.5% Return in about 3 months (around 06/08/2015).

## 2015-03-11 NOTE — Telephone Encounter (Signed)
error 

## 2015-03-27 DIAGNOSIS — Z96651 Presence of right artificial knee joint: Secondary | ICD-10-CM | POA: Diagnosis not present

## 2015-03-27 DIAGNOSIS — Z9181 History of falling: Secondary | ICD-10-CM | POA: Diagnosis not present

## 2015-03-27 DIAGNOSIS — M25562 Pain in left knee: Secondary | ICD-10-CM | POA: Diagnosis not present

## 2015-03-27 DIAGNOSIS — M25561 Pain in right knee: Secondary | ICD-10-CM | POA: Diagnosis not present

## 2015-03-28 ENCOUNTER — Encounter: Payer: Medicare Other | Admitting: Dietician

## 2015-03-28 ENCOUNTER — Encounter: Payer: Medicare Other | Attending: Internal Medicine | Admitting: Dietician

## 2015-03-28 ENCOUNTER — Encounter: Payer: Self-pay | Admitting: Dietician

## 2015-03-28 VITALS — Ht 60.0 in | Wt 161.0 lb

## 2015-03-28 DIAGNOSIS — E119 Type 2 diabetes mellitus without complications: Secondary | ICD-10-CM | POA: Diagnosis not present

## 2015-03-28 DIAGNOSIS — Z713 Dietary counseling and surveillance: Secondary | ICD-10-CM | POA: Diagnosis not present

## 2015-03-28 NOTE — Progress Notes (Signed)
Diabetes Self-Management Education  Visit Type: First/Initial  Appt. Start Time: 1430 Appt. End Time: 1600  03/28/2015  [name], identified by name and date of birth, is a 80 y.o. female with a diagnosis of Diabetes: Type 2. For the past 8 years and was started on insulin at that time.  Other hx includes hyperparathyroidism, multiple fractures, and chronic pain from fibromyalgia and multiple orthopedic problems.  She manages her own medication and was able to verbalize her insulin timing and appropriate doses. She skips lunch at times because she is not home with resulting low blood sugar.  She does continue to drive and this is a concern for her daughter.  She was started on Aricept 1 month ago due to memory issues but her daughter does not notice any difference.    Patient lives with her daughter since a fall about 9 months ago.  She is from [place] and [place].  She is a retired Academic librarian.   ASSESSMENT  Height 5' (1.524 m), weight 161 lb (73.029 kg). Body mass index is 31.44 kg/(m^2).      Diabetes Self-Management Education - 03/28/15 1512    Visit Information   Visit Type First/Initial   Initial Visit   Diabetes Type Type 2   Are you taking your medications as prescribed? Yes   Date Diagnosed 2008 and insulin started 2008   Health Coping   How would you rate your overall health? Fair   Psychosocial Assessment   Patient Belief/Attitude about Diabetes Motivated to manage diabetes   Self-care barriers Other (comment)  memory problems   Self-management support Doctor's office;Family   Other persons present Patient;Family Member  daughter   Patient Concerns Nutrition/Meal planning   How often do you need to have someone help you when you read instructions, pamphlets, or other written materials from your doctor or pharmacy? 4 - Often  due to memory problems   What is the last grade level you completed in school? 2 years college   Complications   Last  HgB A1C per patient/outside source 6.5 %  01/28/15   How often do you check your blood sugar? 1-2 times/day  2x/day   Fasting Blood glucose range (mg/dL) 70-129   Postprandial Blood glucose range (mg/dL) 130-179   Number of hypoglycemic episodes per month 1   Can you tell when your blood sugar is low? Yes   What do you do if your blood sugar is low? eat   Number of hyperglycemic episodes per week 0   Have you had a dilated eye exam in the past 12 months? Yes   Have you had a dental exam in the past 12 months? Yes   Are you checking your feet? Yes   How many days per week are you checking your feet? 7   Dietary Intake   Breakfast wheat toast or everything bagel with [place] and pickles or chicken salad OR eggs and bacon OR cereal and milk  9-10   Snack (morning) --  none   Lunch sometimes skips   Snack (afternoon) pretzels or sugar free cake or peanut butter   Dinner salmon or pork chop or chicken or steaks with vegetables and salad   Snack (evening) pretzels or seldom sugar free ice cream   Beverage(s) water, seltzer, coffee with splenda and sugar free creamer   Exercise   Exercise Type ADL's   Patient Education   Previous Diabetes Education Yes (please comment)  when diagnosed   Disease state  Definition of diabetes, type 1 and 2, and the diagnosis of diabetes   Nutrition management  Role of diet in the treatment of diabetes and the relationship between the three main macronutrients and blood glucose level;Food label reading, portion sizes and measuring food.;Meal options for control of blood glucose level and chronic complications.;Other (comment)  discussed sick day nutrition   Physical activity and exercise  Helped patient identify appropriate exercises in relation to his/her diabetes, diabetes complications and other health issue.;Role of exercise on diabetes management, blood pressure control and cardiac health.   Medications Reviewed patients medication for diabetes, action,  purpose, timing of dose and side effects.   Monitoring Purpose and frequency of SMBG.;Identified appropriate SMBG and/or A1C goals.   Acute complications Taught treatment of hypoglycemia - the 15 rule.;Covered sick day management with medication and food.   Chronic complications Relationship between chronic complications and blood glucose control   Psychosocial adjustment Worked with patient to identify barriers to care and solutions;Role of stress on diabetes   Personal strategies to promote health Lifestyle issues that need to be addressed for better diabetes care   Individualized Goals (developed by patient)   Nutrition General guidelines for healthy choices and portions discussed   Physical Activity Not Applicable   Medications take my medication as prescribed   Monitoring  test my blood glucose as discussed   Outcomes   Expected Outcomes Demonstrated interest in learning. Expect positive outcomes   Future DMSE PRN   Program Status Completed      Individualized Plan for Diabetes Self-Management Training:   Learning Objective:  Patient will have a greater understanding of diabetes self-management. Patient education plan is to attend individual and/or group sessions per assessed needs and concerns.   Plan:   Patient Instructions  Do not skip meals.  Eat Breakfast, Lunch, Dinner and snacks as needed. Go no more than 4-5 hours between meals. Have a small portion of protein (peanut butter, nuts, meat, yogurt, cottage cheese) each time you have a meal or snack. Check your blood sugar 2-3 times per day rotating when you check. Take your medications as prescribed. Call or e-mail for any questions.    Expected Outcomes:  Demonstrated interest in learning. Expect positive outcomes  Education material provided: Living Well with Diabetes, Meal plan card, My Plate and Snack sheet, label reading  If problems or questions, patient to contact team via:  Phone and Email  Future DSME  appointment: PRN

## 2015-03-28 NOTE — Patient Instructions (Signed)
Do not skip meals.  Eat Breakfast, Lunch, Dinner and snacks as needed. Go no more than 4-5 hours between meals. Have a small portion of protein (peanut butter, nuts, meat, yogurt, cottage cheese) each time you have a meal or snack. Check your blood sugar 2-3 times per day rotating when you check. Take your medications as prescribed. Call or e-mail for any questions.

## 2015-03-31 ENCOUNTER — Institutional Professional Consult (permissible substitution): Payer: Medicare Other | Admitting: Pulmonary Disease

## 2015-03-31 DIAGNOSIS — E213 Hyperparathyroidism, unspecified: Secondary | ICD-10-CM | POA: Diagnosis not present

## 2015-04-03 ENCOUNTER — Other Ambulatory Visit: Payer: Medicare Other

## 2015-04-03 DIAGNOSIS — E213 Hyperparathyroidism, unspecified: Secondary | ICD-10-CM

## 2015-04-04 ENCOUNTER — Ambulatory Visit
Admission: RE | Admit: 2015-04-04 | Discharge: 2015-04-04 | Disposition: A | Discharge status: Home / Self Care | Payer: Medicare Other | Source: Ambulatory Visit

## 2015-04-04 DIAGNOSIS — Z1231 Encounter for screening mammogram for malignant neoplasm of breast: Secondary | ICD-10-CM

## 2015-04-04 DIAGNOSIS — M25571 Pain in right ankle and joints of right foot: Secondary | ICD-10-CM | POA: Diagnosis not present

## 2015-04-04 DIAGNOSIS — M25512 Pain in left shoulder: Secondary | ICD-10-CM | POA: Diagnosis not present

## 2015-04-04 LAB — CREATININE, URINE, 24 HOUR
Creatinine, 24H Ur: 0.94 g/(24.h) (ref 0.63–2.50)
Creatinine, Urine: 78 mg/dL (ref 20–320)

## 2015-04-04 LAB — CALCIUM, URINE, 24 HOUR
Calcium, 24 hour urine: 108 mg/24 h (ref 35–250)
Calcium, Ur: 9 mg/dL

## 2015-04-09 ENCOUNTER — Telehealth: Payer: Self-pay | Admitting: Internal Medicine

## 2015-04-09 NOTE — Telephone Encounter (Signed)
Patient states she is going to have several teeth taken out on 1/23.  States usually she is given antibiotic.  She is requesting clindamycin for this.  [facility] [address].

## 2015-04-10 DIAGNOSIS — F4323 Adjustment disorder with mixed anxiety and depressed mood: Secondary | ICD-10-CM | POA: Diagnosis not present

## 2015-04-10 NOTE — Telephone Encounter (Signed)
Patient states due to her asthma and diabetes and other health conditions that it is required.

## 2015-04-10 NOTE — Telephone Encounter (Signed)
Patient aware and will speak to her dentist.

## 2015-04-10 NOTE — Telephone Encounter (Signed)
Left message for patient to call back to discuss dental procedure and to find out exactly why she needs antibiotics.

## 2015-04-10 NOTE — Telephone Encounter (Signed)
Why does she take this? We do not have listed any heart conditions that would require this. She may not need.

## 2015-04-10 NOTE — Telephone Encounter (Signed)
These do not warrant antibiotics from me. If the dentist feels that she needs antibiotics for the procedure then they should prescribe those.

## 2015-04-14 DIAGNOSIS — R600 Localized edema: Secondary | ICD-10-CM | POA: Diagnosis not present

## 2015-04-14 DIAGNOSIS — R262 Difficulty in walking, not elsewhere classified: Secondary | ICD-10-CM | POA: Diagnosis not present

## 2015-04-14 DIAGNOSIS — M25571 Pain in right ankle and joints of right foot: Secondary | ICD-10-CM | POA: Diagnosis not present

## 2015-04-14 DIAGNOSIS — M25512 Pain in left shoulder: Secondary | ICD-10-CM | POA: Diagnosis not present

## 2015-04-21 DIAGNOSIS — M25512 Pain in left shoulder: Secondary | ICD-10-CM | POA: Diagnosis not present

## 2015-04-21 DIAGNOSIS — R262 Difficulty in walking, not elsewhere classified: Secondary | ICD-10-CM | POA: Diagnosis not present

## 2015-04-21 DIAGNOSIS — R600 Localized edema: Secondary | ICD-10-CM | POA: Diagnosis not present

## 2015-04-21 DIAGNOSIS — M25571 Pain in right ankle and joints of right foot: Secondary | ICD-10-CM | POA: Diagnosis not present

## 2015-04-22 ENCOUNTER — Telehealth: Payer: Self-pay | Admitting: *Deleted

## 2015-04-22 MED ORDER — DEXLANSOPRAZOLE 30 MG PO CPDR: 30.0000 mg | DELAYED_RELEASE_CAPSULE | Freq: Every day | ORAL | Status: DC

## 2015-04-22 NOTE — Telephone Encounter (Signed)
Received call pt is wanting [facility]. Inform pt will send electronically...Kayla Thompson

## 2015-04-23 DIAGNOSIS — R262 Difficulty in walking, not elsewhere classified: Secondary | ICD-10-CM | POA: Diagnosis not present

## 2015-04-23 DIAGNOSIS — M25512 Pain in left shoulder: Secondary | ICD-10-CM | POA: Diagnosis not present

## 2015-04-23 DIAGNOSIS — M25571 Pain in right ankle and joints of right foot: Secondary | ICD-10-CM | POA: Diagnosis not present

## 2015-04-23 DIAGNOSIS — R600 Localized edema: Secondary | ICD-10-CM | POA: Diagnosis not present

## 2015-05-09 DIAGNOSIS — Z1389 Encounter for screening for other disorder: Secondary | ICD-10-CM | POA: Diagnosis not present

## 2015-05-09 DIAGNOSIS — M810 Age-related osteoporosis without current pathological fracture: Secondary | ICD-10-CM | POA: Diagnosis not present

## 2015-05-09 DIAGNOSIS — E039 Hypothyroidism, unspecified: Secondary | ICD-10-CM | POA: Diagnosis not present

## 2015-05-09 DIAGNOSIS — E109 Type 1 diabetes mellitus without complications: Secondary | ICD-10-CM | POA: Diagnosis not present

## 2015-05-09 DIAGNOSIS — R5383 Other fatigue: Secondary | ICD-10-CM | POA: Diagnosis not present

## 2015-05-09 DIAGNOSIS — H00019 Hordeolum externum unspecified eye, unspecified eyelid: Secondary | ICD-10-CM | POA: Diagnosis not present

## 2015-05-09 DIAGNOSIS — I10 Essential (primary) hypertension: Secondary | ICD-10-CM | POA: Diagnosis not present

## 2015-05-09 DIAGNOSIS — Z0001 Encounter for general adult medical examination with abnormal findings: Secondary | ICD-10-CM | POA: Diagnosis not present

## 2015-05-09 DIAGNOSIS — E7801 Familial hypercholesterolemia: Secondary | ICD-10-CM | POA: Diagnosis not present

## 2015-05-14 ENCOUNTER — Telehealth: Payer: Self-pay | Admitting: Internal Medicine

## 2015-05-14 NOTE — Telephone Encounter (Signed)
Pt usually gets her prolia shot through her ortho doctor at [facility]. They no longer give those shots and told her to call her PCP. She says she is 2 mos overdue and her insurance always covers this. Please advise

## 2015-05-19 DIAGNOSIS — M25512 Pain in left shoulder: Secondary | ICD-10-CM | POA: Diagnosis not present

## 2015-05-19 DIAGNOSIS — R262 Difficulty in walking, not elsewhere classified: Secondary | ICD-10-CM | POA: Diagnosis not present

## 2015-05-19 DIAGNOSIS — M25571 Pain in right ankle and joints of right foot: Secondary | ICD-10-CM | POA: Diagnosis not present

## 2015-05-19 DIAGNOSIS — R600 Localized edema: Secondary | ICD-10-CM | POA: Diagnosis not present

## 2015-05-19 NOTE — Telephone Encounter (Signed)
Pt is following up on the encounter below. Please advise

## 2015-05-20 ENCOUNTER — Ambulatory Visit: Payer: Medicare Other | Admitting: Pulmonary Disease

## 2015-05-20 NOTE — Telephone Encounter (Signed)
Pt called back and I let her know Dr. Nathanial Millman notes. I put her through to medical records. Please give her a call if there is anything else.

## 2015-05-20 NOTE — Telephone Encounter (Signed)
Is it ok for [place] to start managing her prolia and if so, do you want me to order it? Please advise. I left a message with patient asking her to call me back.

## 2015-05-20 NOTE — Telephone Encounter (Signed)
We would need to get those records from them about last prolia and how long she has been on. But yes we would be able to do that.

## 2015-05-21 DIAGNOSIS — F4323 Adjustment disorder with mixed anxiety and depressed mood: Secondary | ICD-10-CM | POA: Diagnosis not present

## 2015-05-23 NOTE — Telephone Encounter (Signed)
So do I need to run verification or are you going to wait till the records are back?

## 2015-05-23 NOTE — Telephone Encounter (Signed)
Rose, is there a way to know (from [place] or from patient's insurance) when patient's last Prolia injection was?   If you can find that information out---we can verify with insurance now if patient is due for prolia now

## 2015-05-23 NOTE — Telephone Encounter (Signed)
The quickest way to find out would be for her to contact [facility] and find out from them.

## 2015-05-26 DIAGNOSIS — M25512 Pain in left shoulder: Secondary | ICD-10-CM | POA: Diagnosis not present

## 2015-05-26 DIAGNOSIS — M25571 Pain in right ankle and joints of right foot: Secondary | ICD-10-CM | POA: Diagnosis not present

## 2015-05-26 DIAGNOSIS — R600 Localized edema: Secondary | ICD-10-CM | POA: Diagnosis not present

## 2015-05-26 DIAGNOSIS — R262 Difficulty in walking, not elsewhere classified: Secondary | ICD-10-CM | POA: Diagnosis not present

## 2015-05-27 ENCOUNTER — Telehealth: Payer: Self-pay | Admitting: Internal Medicine

## 2015-05-27 MED ORDER — FREESTYLE LANCETS MISC: Status: DC

## 2015-05-27 MED ORDER — GLUCOSE BLOOD VI STRP: ORAL_STRIP | Status: DC

## 2015-05-27 NOTE — Telephone Encounter (Signed)
Pt called requesting refills on her Freestyle Strips and Lancets

## 2015-05-27 NOTE — Telephone Encounter (Signed)
Refill sent to pt's pharmacy. 

## 2015-05-29 DIAGNOSIS — R262 Difficulty in walking, not elsewhere classified: Secondary | ICD-10-CM | POA: Diagnosis not present

## 2015-05-29 DIAGNOSIS — R600 Localized edema: Secondary | ICD-10-CM | POA: Diagnosis not present

## 2015-05-29 DIAGNOSIS — M25571 Pain in right ankle and joints of right foot: Secondary | ICD-10-CM | POA: Diagnosis not present

## 2015-05-29 DIAGNOSIS — M25512 Pain in left shoulder: Secondary | ICD-10-CM | POA: Diagnosis not present

## 2015-05-29 NOTE — Telephone Encounter (Signed)
Spoke with patient. She is going to [facility] today. She is going to have them fax [place] the last date of her most recent Prolia injection and it will be in Tamara's attention.

## 2015-05-30 DIAGNOSIS — H0012 Chalazion right lower eyelid: Secondary | ICD-10-CM | POA: Diagnosis not present

## 2015-06-02 ENCOUNTER — Telehealth: Payer: Self-pay

## 2015-06-02 DIAGNOSIS — M25512 Pain in left shoulder: Secondary | ICD-10-CM | POA: Diagnosis not present

## 2015-06-02 DIAGNOSIS — M25571 Pain in right ankle and joints of right foot: Secondary | ICD-10-CM | POA: Diagnosis not present

## 2015-06-02 DIAGNOSIS — R600 Localized edema: Secondary | ICD-10-CM | POA: Diagnosis not present

## 2015-06-02 DIAGNOSIS — R262 Difficulty in walking, not elsewhere classified: Secondary | ICD-10-CM | POA: Diagnosis not present

## 2015-06-02 NOTE — Telephone Encounter (Signed)
i did receive faxed information for last prolia injection from ortho doctor---patient is being resubmitted in prolia portal for insurance verification and patient cost---once we receive that info from insurance co, patient will be contacted for costs of prolia and scheduled for appt----patients last prolia injection was may/2016, patient is due for prolia now (once we get approval from insurance)

## 2015-06-02 NOTE — Telephone Encounter (Signed)
Recd faxed info about prior prolia injection----patient is due now for another prolia injection, i have resubmitted to patient's insurance for verification---will advise patient after insurance responds to request

## 2015-06-04 ENCOUNTER — Ambulatory Visit: Payer: Medicare Other | Admitting: Internal Medicine

## 2015-06-04 DIAGNOSIS — M25571 Pain in right ankle and joints of right foot: Secondary | ICD-10-CM | POA: Diagnosis not present

## 2015-06-04 DIAGNOSIS — R600 Localized edema: Secondary | ICD-10-CM | POA: Diagnosis not present

## 2015-06-04 DIAGNOSIS — R262 Difficulty in walking, not elsewhere classified: Secondary | ICD-10-CM | POA: Diagnosis not present

## 2015-06-04 DIAGNOSIS — M25512 Pain in left shoulder: Secondary | ICD-10-CM | POA: Diagnosis not present

## 2015-06-04 NOTE — Telephone Encounter (Signed)
Pt called back and I let her know that we are waiting for insurance approval.  She is wanting to get this asap and was wondering if she contacted insurance, would it speed things up any? If so, can you please call her to let her know what to ask for and say.

## 2015-06-05 DIAGNOSIS — F4323 Adjustment disorder with mixed anxiety and depressed mood: Secondary | ICD-10-CM | POA: Diagnosis not present

## 2015-06-09 ENCOUNTER — Ambulatory Visit (INDEPENDENT_AMBULATORY_CARE_PROVIDER_SITE_OTHER): Payer: Medicare Other | Admitting: Internal Medicine

## 2015-06-09 ENCOUNTER — Encounter: Payer: Self-pay | Admitting: Internal Medicine

## 2015-06-09 VITALS — BP 126/68 | HR 72 | Temp 98.1°F | Ht 60.0 in | Wt 160.0 lb

## 2015-06-09 DIAGNOSIS — E1165 Type 2 diabetes mellitus with hyperglycemia: Secondary | ICD-10-CM

## 2015-06-09 DIAGNOSIS — E213 Hyperparathyroidism, unspecified: Secondary | ICD-10-CM | POA: Diagnosis not present

## 2015-06-09 DIAGNOSIS — R260 Ataxic gait: Secondary | ICD-10-CM | POA: Diagnosis not present

## 2015-06-09 DIAGNOSIS — R600 Localized edema: Secondary | ICD-10-CM | POA: Diagnosis not present

## 2015-06-09 DIAGNOSIS — Z794 Long term (current) use of insulin: Secondary | ICD-10-CM

## 2015-06-09 DIAGNOSIS — M25512 Pain in left shoulder: Secondary | ICD-10-CM | POA: Diagnosis not present

## 2015-06-09 DIAGNOSIS — M25571 Pain in right ankle and joints of right foot: Secondary | ICD-10-CM | POA: Diagnosis not present

## 2015-06-09 LAB — VITAMIN D 25 HYDROXY (VIT D DEFICIENCY, FRACTURES): VITD: 81.19 ng/mL (ref 30.00–100.00)

## 2015-06-09 LAB — HEMOGLOBIN A1C: Hgb A1c MFr Bld: 6.3 % (ref 4.6–6.5)

## 2015-06-09 MED ORDER — INSULIN ASPART 100 UNIT/ML ~~LOC~~ SOLN: 4.0000 [IU] | Freq: Every day | SUBCUTANEOUS | Status: DC

## 2015-06-09 MED ORDER — INSULIN GLARGINE 100 UNIT/ML ~~LOC~~ SOLN: 20.0000 [IU] | Freq: Every morning | SUBCUTANEOUS | Status: DC

## 2015-06-09 NOTE — Progress Notes (Signed)
Patient ID: Kayla Thompson, female   DOB: 1935/05/30, 80 y.o.   MRN: [id number]   HPI  Kayla Thompson is a 80 y.o.-year-old female, initially referred by her OB/GYN doctor, Dr. Uvaldo Rising, for evaluation for hyperparathyroidism + normo/hypo-calcemia and DM2, dx 2010, insulin-dependent since 2010, controlled, w/o complications. Last visit 3 mo ago.  Reviewed and addended hx: Pt was dx with hyperparathyroidism in 08/2014.Pertinent labs: Lab Results  Component Value Date   PTH 73* 10/28/2014   PTH Comment 10/28/2014   PTH 190* 09/10/2014   PTH 243* 08/23/2014   CALCIUM 9.4 03/03/2015   CALCIUM 9.7 01/07/2015   CALCIUM 8.7 10/28/2014   CALCIUM 9.2 08/23/2014   CALCIUM 8.0* 08/02/2014   Of note, patient has osteoporosis, dx in 2007.  I reviewed pt's DEXA scans - last from 07/25/2014: Date L1-L2 (L3 and L4 excluded) T score FN T score  07/25/2014   -0.1  LFN: -2.7   She had multiple fractures: - 09/23/1990: pelvic fx - 02/2005: R hip fx - rod - 05/2006: rib fx - 12/2008: 3 R ankle fx - in [place] - plate and screws - [id number]-GRM: wrist fx's 01/2012: steroid inj's R knee - 07/2014: forearm fx and nasal bone fx  She started Prolia 3 or 4 years ago - last inj 07/2014.  No h/o kidney stones.  No h/o CKD. Last BUN/Cr: Lab Results  Component Value Date   BUN 13 03/03/2015   CREATININE 0.88 03/03/2015   Pt is not on HCTZ.  Pt is on calcium (caltrate) 600 mg 2x a day and vitamin D 4000 IU daily; she also eats dairy and green, leafy, vegetables - but not lately during the move.  Had R knee replacement.  Pt does not have a FH of hypercalcemia, pituitary tumors, thyroid cancer, or osteoporosis.   Labs point towards secondary HPTH due to malabsorption of Ca or hyperexcretion of calcium: Component     Latest Ref Rng 10/28/2014 10/28/2014 11/08/2014         1:51 PM  1:51 PM   Calcium     8.7 - 10.3 mg/dL  8.7   PTH     15 - 65 pg/mL  73 (H)   Vitamin D 1, 25 (OH) Total     18 - 72  pg/mL   52  Vitamin D3 1, 25 (OH)        52  Vitamin D2 1, 25 (OH)        <8  Calcium Ionized     1.12 - 1.32 mmol/L 1.21    Magnesium     1.5 - 2.5 mg/dL   2.1  Phosphorus     2.3 - 4.6 mg/dL   3.8   Latest vit D normal: Component     Latest Ref Rng 01/28/2015  VITD     30.00 - 100.00 ng/mL 65.60   A 24h urine collection to check urinary calcium >> normal: Component     Latest Ref Rng 03/31/2015  Calcium, Ur     Not estab mg/dL 9  Calcium, 24 hour urine     35 - 250 mg/24 h 108  Creatinine, Urine     20 - 320 mg/dL 78  Creatinine, 24H Ur     0.63 - 2.50 g/24 h 0.94    She also has DM2: She was dx in 2010. She started insulin in 2010.  Lab Results  Component Value Date   HGBA1C 6.5 01/28/2015   HGBA1C 6.2 10/28/2014   She  is on: - Lantus 30 >> 25 units in am - Novolog 4-6 units 1x a day (in am)  She brings a log: - am: 76-122, 157 >> 67, 77-137, 155 >> 61-119, 140 >> 46, 80-121, 138 - after lunch: 120 >> 116-154 >> n/c  - after dinner: 132 >> 70 - bedtime: 142, 228 >> 97, 116 >> 60  She skipped b'fast one am >> 46 before lunch.   - Latest lipids:  Lab Results  Component Value Date   CHOL 161 01/28/2015   HDL 73.70 01/28/2015   LDLCALC 76 01/28/2015   TRIG 56.0 01/28/2015   CHOLHDL 2 01/28/2015  On Crestor 10 mg daily.  Latest BUN/Cr: Lab Results  Component Value Date   BUN 13 03/03/2015   Lab Results  Component Value Date   CREATININE 0.88 03/03/2015   Last dilated eye exam: 06/2014 (in [place]) >> no DR >> will havea new  one next month.  She also has a history of Ovarian cancer - 1996, urinary incontinence, HTN, HL, hypothyroidism: Lab Results  Component Value Date   TSH 0.36 03/03/2015   ROS: Constitutional: no weight gain, no fatigue, no subjective hyperthermia/hypothermia, + nocturia Eyes: + blurry vision, no xerophthalmia ENT: no sore throat, no nodules palpated in throat, no dysphagia/odynophagia, no hoarseness Cardiovascular: no  CP/+ SOB/no palpitations/+ leg swelling R>L (metal plate) Respiratory: + cough/+ SOB Gastrointestinal: no N/V/D/+ C Musculoskeletal: no muscle/joint aches Skin: no rashes Neurological:+ tremors/no numbness/tingling/dizziness  I reviewed pt's medications, allergies, PMH, social hx, family hx, and changes were documented in the history of present illness. Otherwise, unchanged from my initial visit note. She staopped Buspar and started Aricept.  Past Medical History  Diagnosis Date  . Hypertension   . Diabetes mellitus without complication ([place])   . Asthma   . Ovarian cancer ([place])   . Fibromyalgia   . Osteoporosis   . Arthritis   . GERD (gastroesophageal reflux disease)   . History of chemotherapy   . History of fractured pelvis   . Osteoporosis    Past Surgical History  Procedure Laterality Date  . Hip surgey    . Knee surgey    . Lung surgery    . Bladder suspension    . Ankle fracture surgery      plate and screws  . Abdominal hysterectomy  1996   History   Social History  . Marital Status: Widowed    Spouse Name: N/A  . Number of Children: 2 (1 adopted)   Occupational History  . retired   Social History Main Topics  . Smoking status: Never Smoker   . Smokeless tobacco: Never Used  . Alcohol Use: No  . Drug Use: No   Current Outpatient Prescriptions on File Prior to Visit  Medication Sig Dispense Refill  . ADVAIR DISKUS 500-50 MCG/DOSE AEPB INHALE 1 PUFF BY MOUTH TWICE DAILY 180 each 3  . albuterol (PROVENTIL) (2.5 MG/3ML) 0.083% nebulizer solution Take 2.5 mg by nebulization daily.    . carvedilol (COREG) 6.25 MG tablet Take 6.25 mg by mouth 2 (two) times daily with a meal.    . clindamycin (CLEOCIN) 150 MG capsule Reported on 03/28/2015  0  . denosumab (PROLIA) 60 MG/ML SOLN injection Inject 60 mg into the skin every 6 (six) months. Administer in upper arm, thigh, or abdomen    . Dexlansoprazole 30 MG capsule Take 1 capsule (30 mg total) by mouth daily. 90  capsule 2  . Dextromethorphan-Guaifenesin (Hardy DM MAXIMUM STRENGTH)  60-1200 MG TB12 Take 1 tablet by mouth 2 (two) times daily as needed (for cough/phlegm).    Marland Kitchen donepezil (ARICEPT) 5 MG tablet Take 1 tablet (5 mg total) by mouth at bedtime. 30 tablet 3  . DULoxetine (CYMBALTA) 60 MG capsule Take 60 mg by mouth daily.    . fluticasone (FLONASE) 50 MCG/ACT nasal spray Place 1-2 sprays into both nostrils daily as needed for allergies or rhinitis. 16 g 6  . glucose blood (FREESTYLE TEST STRIPS) test strip Use to test blood sugar 3 times daily. Dx: E11.65 100 each 3  . insulin aspart (NOVOLOG) 100 UNIT/ML injection Inject 4-6 Units into the skin 2 (two) times daily before a meal. Pens. 15 mL 2  . insulin glargine (LANTUS) 100 UNIT/ML injection Inject 0.25 mLs (25 Units total) into the skin every morning. Pens 15 mL 2  . Insulin Pen Needle 32G X 4 MM MISC Use 4x a day 360 each 1  . Lancets (FREESTYLE) lancets Use to test blood sugar 3 times daily. Dx: E11.65 100 each 3  . lansoprazole (PREVACID) 30 MG capsule Take 30 mg by mouth 2 (two) times daily. Reported on 03/28/2015    . levothyroxine (SYNTHROID, LEVOTHROID) 50 MCG tablet Take 1 tablet (50 mcg total) by mouth daily before breakfast. 30 tablet 3  . montelukast (SINGULAIR) 10 MG tablet Take 10 mg by mouth daily.     . NON FORMULARY 1 Syringe by Epidural route every 3 (three) months. Epidural Steroid Injections for Lumbar Spinal Stenosis up to 4 times a year    . oxyCODONE-acetaminophen (PERCOCET/ROXICET) 5-325 MG tablet Take 1 tablet by mouth 2 (two) times daily as needed for severe pain. 45 tablet 0  . predniSONE (DELTASONE) 1 MG tablet Take 2 mg by mouth daily with breakfast.     . pregabalin (LYRICA) 300 MG capsule Take 1 capsule (300 mg total) by mouth 2 (two) times daily. 180 capsule 1  . ranitidine (ZANTAC) 150 MG tablet Take 150 mg by mouth 2 (two) times daily as needed for heartburn.    . rosuvastatin (CRESTOR) 10 MG tablet Take 1 tablet  (10 mg total) by mouth every evening. 30 tablet 2  . tiotropium (SPIRIVA) 18 MCG inhalation capsule Place 18 mcg into inhaler and inhale daily.    . traMADol (ULTRAM) 50 MG tablet 50 mg 2 (two) times daily. Reported on 03/28/2015    . zolpidem (AMBIEN) 5 MG tablet Take 1 tablet (5 mg total) by mouth at bedtime. 30 tablet 2   No current facility-administered medications on file prior to visit.   Allergies  Allergen Reactions  . Latex Rash  . Penicillins Rash   Family History  Problem Relation Age of Onset  . Lung cancer Mother   . CAD Father   . Diabetes Father   . Lung cancer Maternal Aunt   . Diabetes Paternal Aunt   . Diabetes Paternal Uncle   . Diabetes Paternal Grandmother   . Diabetes Paternal Grandfather    PE: BP 126/68 mmHg  Pulse 72  Temp(Src) 98.1 F (36.7 C) (Oral)  Ht 5' (1.524 m)  Wt 160 lb (72.576 kg)  BMI 31.25 kg/m2 Body mass index is 31.25 kg/(m^2). Wt Readings from Last 3 Encounters:  06/09/15 160 lb (72.576 kg)  03/28/15 161 lb (73.029 kg)  03/10/15 160 lb (72.576 kg)   Constitutional: Normal weight, in NAD. No kyphosis. Eyes: PERRLA, EOMI, no exophthalmos ENT: moist mucous membranes, no thyromegaly, no cervical lymphadenopathy Cardiovascular: RRR,  No MRG, R periankle swelling Respiratory: CTA B Gastrointestinal: abdomen soft, NT, ND, BS+ Musculoskeletal: no deformities, strength intact in all 4 Skin: moist, warm, no rashes Neurological: no tremor with outstretched hands, DTR normal in all 4  Assessment: 1. Hyperparathyroidism - Hypo-/normocalcemia   2. DM2, insulin-dependent, controlled, without complications  Plan: 1. [place] - Patient has had 3 instances of a high parathyroid hormone, in the setting of initially a low calcium (and low ionized calcium) and then normal calcium. Both set of labs were drawn apparently after Prolia infusion, which can lower calcium levels. She does not have a history of hypercalcemia. She is taking calcium  supplements, 600 mg 2x a day (Caltrate). Of note, patient has normal vitamin D levels, last 65.  - Her parathyroid hormone is decreasing, and her calcium is low normal. A 24-hour urine calcium is normal.Her vitamin D is normal. - will continue to keep an eye on her PTH, Ca and vitamin D. She absolutely refused surgery for a possible parathyroid adenoma, but my suspicion is low, though.  2. DM2 - Patient with long-standing, on basal bolus insulin regimen, with fairly good control, however, with some low sugars  >> will decrease Lantus to 20 units. She is taking Novolog only once a day in am >> will continue this for now as she does not check sugars later in the day - I advised her to  Patient Instructions  Please stop at the lab.  Please decrease Lantus to 20 units in am.  Continue NovoLog 4-6 units once a day, before b'fast.  Check sugars 1x a day, rotating check times.  Please come back for a follow-up appointment in 3 months.  - continue checking sugars at different times of the day - check at least 1 x a day, rotating checks - advised for yearly eye exams >> she is UTD - reviewed last hemoglobin A1c - great, at 6.5% >> recheck today Return in about 3 months (around 09/09/2015).  Component     Latest Ref Rng 06/09/2015 06/09/2015         3:51 PM  3:51 PM  Calcium     8.7 - 10.3 mg/dL  9.2  PTH     15 - 65 pg/mL  37  Hemoglobin A1C     4.6 - 6.5 % 6.3   VITD     30.00 - 100.00 ng/mL 81.19   Labs are great!

## 2015-06-09 NOTE — Patient Instructions (Signed)
Please stop at the lab.  Please decrease Lantus to 20 units in am.  Continue NovoLog 4-6 units once a day, before b'fast.  Check sugars 1x a day, rotating check times.  Please come back for a follow-up appointment in 3 months.

## 2015-06-10 ENCOUNTER — Encounter: Payer: Self-pay | Admitting: *Deleted

## 2015-06-10 LAB — PTH, INTACT AND CALCIUM
Calcium: 9.2 mg/dL (ref 8.7–10.3)
PTH: 37 pg/mL (ref 15–65)

## 2015-06-11 DIAGNOSIS — M25512 Pain in left shoulder: Secondary | ICD-10-CM | POA: Diagnosis not present

## 2015-06-11 DIAGNOSIS — M1712 Unilateral primary osteoarthritis, left knee: Secondary | ICD-10-CM | POA: Diagnosis not present

## 2015-06-11 DIAGNOSIS — M25562 Pain in left knee: Secondary | ICD-10-CM | POA: Diagnosis not present

## 2015-06-11 DIAGNOSIS — R262 Difficulty in walking, not elsewhere classified: Secondary | ICD-10-CM | POA: Diagnosis not present

## 2015-06-11 DIAGNOSIS — R600 Localized edema: Secondary | ICD-10-CM | POA: Diagnosis not present

## 2015-06-11 DIAGNOSIS — M25571 Pain in right ankle and joints of right foot: Secondary | ICD-10-CM | POA: Diagnosis not present

## 2015-06-11 DIAGNOSIS — M19012 Primary osteoarthritis, left shoulder: Secondary | ICD-10-CM | POA: Diagnosis not present

## 2015-06-12 ENCOUNTER — Ambulatory Visit: Payer: Medicare Other | Admitting: Pulmonary Disease

## 2015-06-13 DIAGNOSIS — H2513 Age-related nuclear cataract, bilateral: Secondary | ICD-10-CM | POA: Diagnosis not present

## 2015-06-16 NOTE — Telephone Encounter (Signed)
Patient called back in. Advised that you were in a meeting and would return her call.

## 2015-06-18 DIAGNOSIS — M25571 Pain in right ankle and joints of right foot: Secondary | ICD-10-CM | POA: Diagnosis not present

## 2015-06-18 DIAGNOSIS — M25512 Pain in left shoulder: Secondary | ICD-10-CM | POA: Diagnosis not present

## 2015-06-18 DIAGNOSIS — R262 Difficulty in walking, not elsewhere classified: Secondary | ICD-10-CM | POA: Diagnosis not present

## 2015-06-18 DIAGNOSIS — R600 Localized edema: Secondary | ICD-10-CM | POA: Diagnosis not present

## 2015-06-23 ENCOUNTER — Encounter: Payer: Self-pay | Admitting: Pulmonary Disease

## 2015-06-23 ENCOUNTER — Ambulatory Visit (INDEPENDENT_AMBULATORY_CARE_PROVIDER_SITE_OTHER): Payer: Medicare Other | Admitting: Pulmonary Disease

## 2015-06-23 VITALS — BP 124/78 | HR 70 | Temp 97.7°F | Ht 60.0 in | Wt 160.6 lb

## 2015-06-23 DIAGNOSIS — M069 Rheumatoid arthritis, unspecified: Secondary | ICD-10-CM

## 2015-06-23 DIAGNOSIS — J479 Bronchiectasis, uncomplicated: Secondary | ICD-10-CM

## 2015-06-23 DIAGNOSIS — R05 Cough: Secondary | ICD-10-CM

## 2015-06-23 DIAGNOSIS — R0600 Dyspnea, unspecified: Secondary | ICD-10-CM | POA: Diagnosis not present

## 2015-06-23 DIAGNOSIS — R059 Cough, unspecified: Secondary | ICD-10-CM

## 2015-06-23 MED ORDER — MONTELUKAST SODIUM 10 MG PO TABS: 10.0000 mg | ORAL_TABLET | Freq: Every day | ORAL | Status: DC

## 2015-06-23 MED ORDER — TIOTROPIUM BROMIDE MONOHYDRATE 18 MCG IN CAPS: 18.0000 ug | ORAL_CAPSULE | Freq: Every day | RESPIRATORY_TRACT | Status: DC

## 2015-06-23 MED ORDER — FLUTICASONE-SALMETEROL 500-50 MCG/DOSE IN AEPB: 1.0000 | INHALATION_SPRAY | Freq: Two times a day (BID) | RESPIRATORY_TRACT | Status: DC

## 2015-06-23 NOTE — Progress Notes (Signed)
Subjective:     Patient ID: Kayla Thompson, female   DOB: Jul 01, 1935, 80 y.o.   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  ~  January 07, 2015:  Initial pulmonary consult w/ SN>      80 y/o WF, referred by Kayla Thompson) for a pulmonary evaluation due to cough & dyspnea>  Kayla Thompson moved to [place] about 27mo ago to be near her daughter, granddaughter, and great-grands after her husb passed away;  Shortly after arrival she fell & was [facility] w/ fx nasal bones and left wrist (distal radius & ulna) managed by Kayla Thompson summary reviewed... She established Primary Care w/ Kayla Thompson, Endocrine- Kayla Thompson... She has a Hx of pulmonary problems identified by the Pt & records from [facility] as Bronchiectasis and Asthma;  Her CC is SOB/ DOE w/ min activity & it is apparent that she is rather sedentary and a poor historian; she notes mild cough, some yellow sputum, no hemoptysis; she denies CP, palpit, or signif cardiac history...  Smoking Hx>  She is a never smoker, but had signif 2nd hand exposure from her husb...  Pulmonary Hx>  She indicates hx left lung surg 2000 for benign granuloma; hx bronchiectasis but she does not know how the Dx was established; told asthma as well ever since early 2000's but not as a child or young adult; states she had pneumonia age 52 at same time as the measles (not hosp & made full recovery w/o apparent residual resp problems); she does admit to recurrent bronchitic infections as adult requiring antibiotics & occas Pred rx... She adds a rambling hx of work-ups at [facility] (in [place] in [place] but she does not know what this was for?       Notes from Kayla Thompson in [place]> Bronchiectasis, mult pulm nodules, & mild Asthma; prev pulm nodule resected in 2000 which proved to be a granuloma; he reported that an atypical  mycobacterium was found at the time of lung bx in the past- no further details provided; seen for cough w/ yellow sput & incr dyspnea- reported to be stable on Advair500Bid, Spiriva daily, Singulair10, NEB w/ Albut-Bid, Mucinex, VentolinHFA rescue prn; she was also on Pred 3mg /d for PMR from her Rheum;   Medical Hx>  Diastolic Dysfunction on 2DEcho, mild AS, HL, IDDM, Hypothyroid, GERD, DJD w/ TKR and ankle surg; Fibromyalgia on Lyrica/ Percocet/ Tramadol; PMR per Rheum in [place] on Pred2mg /d x 34yr (it was last dropped from 3mg  ~12yr ago); Osteoporosis; Anemia; Anxiety.   Family Hx>  Mother died from lung cancer (as did one of her sisters);  Father passed from heart disease.  Occup Hx>  No known occupational exposures  Current Meds>  SEE FULL MED LIST BELOW EXAM showed Afeb, VSS, O2sat=99% on RA;  Heent- neg, mallampati3;  Chest- decr BS bilat, not congested, w/o w/r/r heard;  Heart- RR gr1/6SEM w/o r/g detected;  Abd- soft, nontender, +panniculus;  Ext- VI w/o c/c/e...   CT Chest 11/17/09 in [place]> mult noncalcif pulm nodules bilat (stable when compared to older scans), mucoid impaction LUL & RML, pleuroparenchymal scarring right base (unchanged), no adenopathy, norm heart size & coronary calcif.  PFTs 02/04/10 in [place]> FVC=2.20 (95%), FEV1=1.42 (88%), %1sec=65, mid-flows reduced at 38% predicted; no improvement in FEV1 after bronchodil; TLC=4.70 (118%), RV=2.48 (153%), RV/TLC=53%; DLCO=54% predicted.  CXR 01/14/14 reported to show norm heart  size, clear lungs, thoracic spondylosis, NAD...   CXR 08/02/14 in ER here after fall showed norm heart size, diffuse interstitial coarsening w/o focal airsp dis...   2DEcho 08/02/14 in [place] after fall showed norm LVF w/ EF=55-60%, no regional wall motion abn, Gr1 DD, mild AS w/ mean gradient 48mmHg; mod calcif MV leaflets w/o stenosis or regurg; RV looked OK but could not est PAsys...  CXR here 01/07/15 showed norm heart size, incr AP diameter of chest & flat  diaphragms, incr markings diffusely, old right rib fxs, osteopenia...   Spirometry 01/07/15> we attempted mult trial of simple spirometry but pt could not generate an acceptable curve & results are not reliable...  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 We will follow closely.    Cardiac> on Coreg6.25Bid for ?HBP; she has mild AS & DiastCHF on 2DEcho and this needs follow up...    Endocrine> IDDM, Hypothyroid, transiently elev serum calcium ?etiology (resolved spont); managed by Kayla Thompson; BS today is 45 & asked to eat & check w/ endocrine re- adjustment in Novolog...    GYN> Hx ovarian cancer (in remission- details unknown), s/p hyst    Ortho/ Rheum> DJD w/ TKR, FM on Lyrica etc, severe LBP requires epidural shots; PMR on Pred (details unknown); her Sed=52 and she will prob need Rheum to help sort this out...    Heme> Hg 10-11 range May2016 after fall w/ fx nose & wrist; similar now & may need further anemia work-up as well...  ADDENDUM>>  Hi-res CT Chest 01/14/15:  Norm heart size, atherosclerosis of Ao/ great vessels/ coronaries, no adenopathy, atrophic thyroid; ?small sliding pulm hernia betw the lat 7th & 8th ribs w/ scarring; extensive bronchiectasis bilat w/ mucoid impactions within dilated airways (infectious/inflamm bronchiolitis- r/o MAI), 1mm LLL  nodule, tracheobronchomalacia w/ extensive bilat air trapping on expiration, smallHH, old healed right 6th-7th rib fx, DJD Tspine...  REC> continue NEBS, Advair, Spiriva, Mucinex (maximize to 600mg  Qid w/ fluids), practice purse lip breathing to splint the airways open & aide in expectoration of phlegm, use FLUTTER valve & postural drainage to get the sput out; collect sput for routine C&S, AFB, Fungi => TC w/ pt: she has a lot going on, refuses further meds, refuses further eval, can't be bothered w/ learning Chest PT/ Flutter, postural drainage, purse lip breathing, collecting sputum specimen, etc; states she's had all this a long time & not interested in more aggressive pulmonary treatments at this time... She will f/u w/ me in November & we will discuss again.  ADDENDUM>>  Records from Kayla Thompson, Pulmonary in [place]=> note of 07/18/13:  Hx mild asthma & bronchiectasis, hx mult pulm nodules followed for several yrs & one removed surgically in 2000 that proved to be a granuloma & report of an atypical mycobacterium being found at the same time (but no details given), on NEB w/ Albuterol,  Advair, Spiriva, Singulair, Mucinex, low-dose Pred for PMR;  She was given all indicated vaccinations;  No change in her med regimen for yrs...   ~  February 19, 2015:  6wk ROV w/ SN>  SEE ABOVE-- pt notes nasal congestion, chest congestion, sl cough & incr SOB for the last wk or so; in the next breath she says "I seldom get a cold", thinks it's all allergies, notes great-grnads recently ill;  She has NEBS w/ Albut (only using once per day), Advair500Bid, Spiriva once daily, Mucinex (?how much), Pred?1mg - 3/d, Singulair10/ Zyrtek10...  Since she was here last she has followed up w/ PCP-DrCrawford 10/25> FM doing ok on Lyrica & she reported more functional & resting better...   She also had a f/u w/ Endocrine-Kayla Thompson 11/8> DM, osteoporosis, hypothyroid, & Hx hyperpara; Labs reviewed & ok (A1c=6.5 on her insulin,  Ca is wnl and vitD=66)...    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     Cardiac> on Coreg6.25Bid for ?HBP; she has mild AS & DiastCHF on 2DEcho and this needs follow up w/ her PCP vs Cards...    Endocrine> IDDM, Hypothyroid, transiently elev serum calcium ?etiology (resolved spont); managed by Kayla Thompson...    GYN> Hx ovarian cancer (in remission- details unknown), s/p hyst & Ca125=9 (08/2014)...    Ortho/ Rheum> DJD w/ TKR, FM on Lyrica, Tramadol, Percocet,etc, severe LBP requires epidural shots; PMR on Pred (details unknown); her Sed=52 and she will prob need Rheum to help sort this out...    Heme> Hg 10-11 range May2016 after fall w/ fx nose & wrist; similar now & may need further anemia work-up as well... EXAM showed Afeb, VSS, O2sat=93% on RA;  Wt=162#;  Heent- neg, mallampati3;  Chest- decr BS bilat, not congested, w/o w/r/r heard;  Heart- RR gr1/6SEM w/o r/g detected;  Abd- soft, nontender, +panniculus;  Ext- VI w/o c/c/e...   Pt given a neb treatment in office- unable to produce any sput for cultures... IMP/PLAN>>  We discussed Rx w/ ZPak for her URI (it could also help her if she has atyp-mycobacterium); we compromised rx w/ NEBS TID- followed by AdvairBid & Spiriva once daily, plus the MUCINEX1200mg Bid w/ fluids;  She again declines the added benefit from a vigorous chest PT regimen, flutter valve, postural drainage, purse lip breathing, etc... we plan a recheck in 3 months...  ~  June 23, 2015:  75mo ROV  w/ SN>  Kayla Thompson returns unaccompanied and states that her breathing is good- no problem she says; notes min cough, sm amt whitish sput (no color, no blood), admits to mild SOB/DOE but swears there is no progression, and denies CP/ f/c/s/ etc... She states that she was INTOL to ZPak given last OV but can't recall the reaction ("?didn't work for me"), doesn't want it again;  We reviewed her meds & again she is not being aggressive enough to effectively treat her bronchiectasis & mucoid impactions> eg. Only using the Albut NEBS prn & ave once Qod, only using the Mucinex 1200 once daily, she says she is using the ADVAIR500Bid, SPIRIVA once daily, SINGULAIR10, PRED1mg -2tabs  Qam ?per PCP/ ?Rheum... 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    Cardiac> on Coreg6.25- 1/2Bid for ?HBP; she has mild AS & DiastCHF on 2DEcho and this needs follow up w/ her PCP vs Cards...    Endocrine> IDDM on Lantus & Novolog (A1c=6.3), HL on Crestor10 (FLP- controlled), Hypothyroid on Synthroid50, transiently elev serum calcium ?etiology (resolved spont), osteoporosis w/ mult fxs on Prolia Q19mo; managed by Kayla Thompson & last seen 06/09/15- note reviewed...    GYN> Hx ovarian cancer (in remission- details unknown), s/p hyst & Ca125=9 (08/2014)...    Ortho/ Rheum> DJD w/ TKR, FM on Lyrica,  Tramadol, Percocet,etc, severe LBP requires epidural shots; PMR on Pred (details unknown); her Sed=52 and she will prob need Rheum to help sort this out...    Neuro> on Aricept5 per DrCrawford, I wonder if she is taking any/ all of her meds properly, no care giver accompanies pt to office visits, she is ?stoic ?oblivious...    Heme> Hg 10-11 range May2016 after fall w/ fx nose & wrist; similar now & may need further anemia work-up as well, deferred to PCP... EXAM showed Afeb, VSS, O2sat=98% on RA;  Wt=161#;  Heent- neg, mallampati3;  Chest- decr BS bilat, not congested, w/o w/r/r heard;  Heart- RR gr1/6SEM w/o r/g detected;  Abd- soft, nontender, +panniculus;  Ext- VI w/o c/c/e...   LABS 2016:  Reviewed... IMP/PLAN>>  We again outlined for Kayla Thompson a vigorous home treatment regimen for her bronchiectasis & mucoid impactions including a TID regimen>  NEB w/ Albut vs Duoneb Tid, followed by her Advair500Bid & Spiriva once/d, Guaifenesin 400mg  tabs- 2Tid w/ fluids, take her low dose Pred in AM & Singulair in PM; she declines to do chest PT/ Flutter & postural drainage treatments...    Past Medical History  Diagnosis Date  . Hypertension   . Diabetes mellitus without complication ([place])   . Asthma   . Ovarian cancer ([place])   . Fibromyalgia   . Osteoporosis   . Arthritis   . GERD (gastroesophageal reflux disease)   . History of chemotherapy   . History of fractured pelvis   . Osteoporosis     Past Surgical History  Procedure Laterality Date  . Hip surgey    . Knee surgey    . Lung surgery >> resection of nodule 2000, proved to be a benign granuloma & ?atyp mycobacterium ident at the same time?    . Bladder suspension    . Ankle fracture surgery      plate and screws  . Abdominal hysterectomy  1996    Outpatient Encounter Prescriptions as of 06/23/2015  Medication Sig  . albuterol (PROVENTIL) (2.5 MG/3ML) 0.083% nebulizer solution Take 2.5 mg by nebulization daily.  . carvedilol (COREG)  6.25 MG tablet Take 3.125 mg by mouth 2 (two) times daily with a meal.   . denosumab (PROLIA) 60 MG/ML SOLN  injection Inject 60 mg into the skin every 6 (six) months. Administer in upper arm, thigh, or abdomen  . Dexlansoprazole 30 MG capsule Take 1 capsule (30 mg total) by mouth daily.  Marland Kitchen Dextromethorphan-Guaifenesin (MUCINEX DM MAXIMUM STRENGTH) 60-1200 MG TB12 Take 1 tablet by mouth 2 (two) times daily as needed (for cough/phlegm). Reported on 06/23/2015  . donepezil (ARICEPT) 5 MG tablet Take 1 tablet (5 mg total) by mouth at bedtime.  . DULoxetine (CYMBALTA) 60 MG capsule Take 60 mg by mouth daily.  . fluticasone (FLONASE) 50 MCG/ACT nasal spray Place 1-2 sprays into both nostrils daily as needed for allergies or rhinitis.  . Fluticasone-Salmeterol (ADVAIR DISKUS) 500-50 MCG/DOSE AEPB Inhale 1 puff into the lungs 2 (two) times daily.  Marland Kitchen glucose blood (FREESTYLE TEST STRIPS) test strip Use to test blood sugar 3 times daily. Dx: E11.65  . insulin aspart (NOVOLOG) 100 UNIT/ML injection Inject 4-6 Units into the skin daily before breakfast. Pens. (Patient taking differently: Inject 4 Units into the skin daily before breakfast. Pens.)  . insulin glargine (LANTUS) 100 UNIT/ML injection Inject 0.2 mLs (20 Units total) into the skin every morning. Pens  . Insulin Pen Needle 32G X 4 MM MISC Use 4x a day  . Lancets (FREESTYLE) lancets Use to test blood sugar 3 times daily. Dx: E11.65  . levothyroxine (SYNTHROID, LEVOTHROID) 50 MCG tablet Take 1 tablet (50 mcg total) by mouth daily before breakfast.  . montelukast (SINGULAIR) 10 MG tablet Take 1 tablet (10 mg total) by mouth daily.  . NON FORMULARY 1 Syringe by Epidural route every 3 (three) months. Epidural Steroid Injections for Lumbar Spinal Stenosis up to 4 times a year  . oxyCODONE-acetaminophen (PERCOCET/ROXICET) 5-325 MG tablet Take 1 tablet by mouth 2 (two) times daily as needed for severe pain.  . pravastatin (PRAVACHOL) 40 MG tablet Take 40 mg by  mouth daily.  . predniSONE (DELTASONE) 1 MG tablet Take 2 mg by mouth daily with breakfast.   . pregabalin (LYRICA) 300 MG capsule Take 1 capsule (300 mg total) by mouth 2 (two) times daily.  . ranitidine (ZANTAC) 150 MG tablet Take 150 mg by mouth 2 (two) times daily as needed for heartburn.  . rosuvastatin (CRESTOR) 10 MG tablet Take 1 tablet (10 mg total) by mouth every evening.  . tiotropium (SPIRIVA) 18 MCG inhalation capsule Place 1 capsule (18 mcg total) into inhaler and inhale daily.  Marland Kitchen zolpidem (AMBIEN) 5 MG tablet Take 1 tablet (5 mg total) by mouth at bedtime. (Patient taking differently: Take 5 mg by mouth at bedtime as needed. )  . [DISCONTINUED] ADVAIR DISKUS 500-50 MCG/DOSE AEPB INHALE 1 PUFF BY MOUTH TWICE DAILY  . [DISCONTINUED] montelukast (SINGULAIR) 10 MG tablet Take 10 mg by mouth daily.   . [DISCONTINUED] tiotropium (SPIRIVA) 18 MCG inhalation capsule Place 18 mcg into inhaler and inhale daily.  . [DISCONTINUED] clindamycin (CLEOCIN) 150 MG capsule Reported on 06/23/2015  . [DISCONTINUED] lansoprazole (PREVACID) 30 MG capsule Take 30 mg by mouth 2 (two) times daily. Reported on 06/23/2015  . [DISCONTINUED] traMADol (ULTRAM) 50 MG tablet 50 mg 2 (two) times daily. Reported on 06/23/2015   No facility-administered encounter medications on file as of 06/23/2015.    Allergies  Allergen Reactions  . Latex Rash  . Penicillins Rash    Current Medications, Allergies, Past Medical History, Past Surgical History, Family History, and Social History were reviewed in [employer] record.   Review of Systems  All symptoms NEG except where BOLDED >>  Constitutional:  F/C/S, fatigue, anorexia, unexpected weight change. HEENT:  HA, visual changes, hearing loss, earache, nasal symptoms, sore throat, mouth sores, hoarseness. Resp:  cough, sputum, hemoptysis; SOB, tightness, wheezing. Cardio:  CP, palpit, DOE, orthopnea, edema. GI:  N/V/D/C, blood in stool;  reflux, abd pain, distention, gas. GU:  dysuria, freq, urgency, hematuria, flank pain, voiding difficulty. MS:  joint pain, swelling, tenderness, decr ROM; neck pain, back pain, etc. Neuro:  HA, tremors, seizures, dizziness, syncope, weakness, numbness, gait abn. Skin:  suspicious lesions or skin rash. Heme:  adenopathy, bruising, bleeding. Psyche:  confusion, agitation, sleep disturbance, hallucinations, anxiety, depression suicidal.   Objective:   Physical Exam       Vital Signs:  Reviewed...  General:  WD, WN, 80 y/o WF in NAD; alert & oriented; pleasant & cooperative... HEENT:  [place]/AT; Conjunctiva- pink, Sclera- nonicteric, EOM-wnl, PERRLA, EACs-clear, TMs-wnl; NOSE-clear; THROAT-clear & wnl. Neck:  Supple w/ fairROM; no JVD; normal carotid impulses w/o bruits; no thyromegaly or nodules palpated; no lymphadenopathy. Chest:  Decr BS bilat, few scat rhonchi otherw clear not congested no wheezing rales or signs of consolidation... Heart:  Regular Rhythm; norm S1 & S2 w/ Gr1/6 SEM w/o rubs or gallops detected... Abdomen:  Soft & nontender, +panniculus- no guarding or rebound; normal bowel sounds; no organomegaly or masses palpated. Ext:  decrROM; without deformities +arthritic changes; +venous insuffic, no edema;  Pulses intact w/o bruits. Neuro:  No focal neuro deficit; abn gait & balance poor, uses cane... Derm:  No lesions noted; no rash etc. Lymph:  No cervical, supraclavicular, axillary, or inguinal adenopathy palpated.   Assessment:      IMP >>  Complex 80 y/o woman:    Pulmonary> Hx COPD/ Bronchiectasis, Hx granulomatous lung dis & reported atyp mycobacterium in the past>  Hi-res CTChest 12/2014 showed extensive bronchiectasis bilat w/ mucoid impactions within dilated airways, 23mm LLL nodule, and tracheobronchomalacia w/ extensive air trapping on expiration;  We encouraged a regular bronchodilator regimen, Mucinex 600mg Qid, Fluids, Flutter/ postural drainage/ purse-lip breathing  exercises, & check sput for cultures but she could not be bothered & refused further pulm treatment/ testing sput/ etc...     Cardiac> on Coreg6.25Bid for ?HBP; she has mild AS & DiastCHF on 2DEcho and this needs follow up w/ her PCP vs Cards...    Endocrine> IDDM, Hypothyroid, transiently elev serum calcium ?etiology (resolved spont); managed by Kayla Thompson...    GYN> Hx ovarian cancer (in remission- details unknown), s/p hyst & Ca125=9 (08/2014)...    Ortho/ Rheum> DJD w/ TKR, FM on Lyrica, Tramadol, Percocet,etc, severe LBP requires epidural shots; PMR on Pred (details unknown); her Sed=52 and she will prob need Rheum to help sort this out...    Heme> Hg 10-11 range May2016 after fall w/ fx nose & wrist; similar now & may need further anemia work-up as well...  PLAN >>   01/19/15>   We discussed Rx w/ ZPak for her URI (it could also help her if she has atyp-mycobacterium); we compromised rx w/ NEBS TID- followed by AdvairBid & Spiriva once daily, plus the MUCINEX1200mg Bid w/ fluids;  She again declines the added benefit from a vigorous chest PT regimen, flutter valve, postural drainage, purse lip breathing, etc... 06/23/15>   We again outlined for Kayla Thompson a vigorous home treatment regimen for her bronchiectasis & mucoid impactions including a TID regimen>  NEB w/ Albut vs Duoneb Tid, followed by her Advair500Bid & Spiriva once/d, Guaifenesin 400mg  tabs- 2Tid  w/ fluids, take her low dose Pred in AM & Singulair in PM; she declines to do chest PT/ Flutter & postural drainage treatments...     Plan:     Patient's Medications  New Prescriptions   No medications on file  Previous Medications   ALBUTEROL (PROVENTIL) (2.5 MG/3ML) 0.083% NEBULIZER SOLUTION    Take 2.5 mg by nebulization daily.   CARVEDILOL (COREG) 6.25 MG TABLET    Take 3.125 mg by mouth 2 (two) times daily with a meal.    DENOSUMAB (PROLIA) 60 MG/ML SOLN INJECTION    Inject 60 mg into the skin every 6 (six) months. Administer in upper  arm, thigh, or abdomen   DEXLANSOPRAZOLE 30 MG CAPSULE    Take 1 capsule (30 mg total) by mouth daily.   DEXTROMETHORPHAN-GUAIFENESIN (MUCINEX DM MAXIMUM STRENGTH) 60-1200 MG TB12    Take 1 tablet by mouth 2 (two) times daily as needed (for cough/phlegm). Reported on 06/23/2015   DONEPEZIL (ARICEPT) 5 MG TABLET    Take 1 tablet (5 mg total) by mouth at bedtime.   DULOXETINE (CYMBALTA) 60 MG CAPSULE    Take 60 mg by mouth daily.   FLUTICASONE (FLONASE) 50 MCG/ACT NASAL SPRAY    Place 1-2 sprays into both nostrils daily as needed for allergies or rhinitis.   GLUCOSE BLOOD (FREESTYLE TEST STRIPS) TEST STRIP    Use to test blood sugar 3 times daily. Dx: E11.65   INSULIN ASPART (NOVOLOG) 100 UNIT/ML INJECTION    Inject 4-6 Units into the skin daily before breakfast. Pens.   INSULIN GLARGINE (LANTUS) 100 UNIT/ML INJECTION    Inject 0.2 mLs (20 Units total) into the skin every morning. Pens   INSULIN PEN NEEDLE 32G X 4 MM MISC    Use 4x a day   LANCETS (FREESTYLE) LANCETS    Use to test blood sugar 3 times daily. Dx: E11.65   LEVOTHYROXINE (SYNTHROID, LEVOTHROID) 50 MCG TABLET    Take 1 tablet (50 mcg total) by mouth daily before breakfast.   NON FORMULARY    1 Syringe by Epidural route every 3 (three) months. Epidural Steroid Injections for Lumbar Spinal Stenosis up to 4 times a year   OXYCODONE-ACETAMINOPHEN (PERCOCET/ROXICET) 5-325 MG TABLET    Take 1 tablet by mouth 2 (two) times daily as needed for severe pain.   PRAVASTATIN (PRAVACHOL) 40 MG TABLET    Take 40 mg by mouth daily.   PREDNISONE (DELTASONE) 1 MG TABLET    Take 2 mg by mouth daily with breakfast.    PREGABALIN (LYRICA) 300 MG CAPSULE    Take 1 capsule (300 mg total) by mouth 2 (two) times daily.   RANITIDINE (ZANTAC) 150 MG TABLET    Take 150 mg by mouth 2 (two) times daily as needed for heartburn.   ROSUVASTATIN (CRESTOR) 10 MG TABLET    Take 1 tablet (10 mg total) by mouth every evening.   ZOLPIDEM (AMBIEN) 5 MG TABLET    Take 1 tablet  (5 mg total) by mouth at bedtime.  Modified Medications   Modified Medication Previous Medication   FLUTICASONE-SALMETEROL (ADVAIR DISKUS) 500-50 MCG/DOSE AEPB ADVAIR DISKUS 500-50 MCG/DOSE AEPB      Inhale 1 puff into the lungs 2 (two) times daily.    INHALE 1 PUFF BY MOUTH TWICE DAILY   MONTELUKAST (SINGULAIR) 10 MG TABLET montelukast (SINGULAIR) 10 MG tablet      Take 1 tablet (10 mg total) by mouth daily.    Take 10 mg  by mouth daily.    TIOTROPIUM (SPIRIVA) 18 MCG INHALATION CAPSULE tiotropium (SPIRIVA) 18 MCG inhalation capsule      Place 1 capsule (18 mcg total) into inhaler and inhale daily.    Place 18 mcg into inhaler and inhale daily.  Discontinued Medications   CLINDAMYCIN (CLEOCIN) 150 MG CAPSULE    Reported on 06/23/2015   LANSOPRAZOLE (PREVACID) 30 MG CAPSULE    Take 30 mg by mouth 2 (two) times daily. Reported on 06/23/2015   TRAMADOL (ULTRAM) 50 MG TABLET    50 mg 2 (two) times daily. Reported on 06/23/2015

## 2015-06-23 NOTE — Patient Instructions (Signed)
Today we updated your med list in our EPIC system...    Continue your current medications the same...  We refilled your Advair, Spiriva, and Singulair as requested...  You may try the [employer] brand of GUAIFENESIN (generic Mucinex)...    It comes in 400mg  tabs and you still need 2400mg  per day for your bronchiectasis.Marland KitchenMarland Kitchen    I recommend taking 2 tabs 3 times daily w/ extra water/ fluids...  Call for any questions...  Let's plan a follow up visit in 37months, sooner if needed for problems.Marland KitchenMarland Kitchen

## 2015-06-24 DIAGNOSIS — F4323 Adjustment disorder with mixed anxiety and depressed mood: Secondary | ICD-10-CM | POA: Diagnosis not present

## 2015-07-02 DIAGNOSIS — H16223 Keratoconjunctivitis sicca, not specified as Sjogren's, bilateral: Secondary | ICD-10-CM | POA: Diagnosis not present

## 2015-07-10 ENCOUNTER — Other Ambulatory Visit: Payer: Self-pay | Admitting: Internal Medicine

## 2015-07-10 DIAGNOSIS — M1712 Unilateral primary osteoarthritis, left knee: Secondary | ICD-10-CM | POA: Diagnosis not present

## 2015-07-10 DIAGNOSIS — M19012 Primary osteoarthritis, left shoulder: Secondary | ICD-10-CM | POA: Diagnosis not present

## 2015-07-10 DIAGNOSIS — M7052 Other bursitis of knee, left knee: Secondary | ICD-10-CM | POA: Diagnosis not present

## 2015-07-15 ENCOUNTER — Ambulatory Visit: Payer: Medicare Other | Admitting: Internal Medicine

## 2015-07-17 DIAGNOSIS — M1712 Unilateral primary osteoarthritis, left knee: Secondary | ICD-10-CM | POA: Diagnosis not present

## 2015-07-17 DIAGNOSIS — F4323 Adjustment disorder with mixed anxiety and depressed mood: Secondary | ICD-10-CM | POA: Diagnosis not present

## 2015-07-17 DIAGNOSIS — M7052 Other bursitis of knee, left knee: Secondary | ICD-10-CM | POA: Diagnosis not present

## 2015-07-18 ENCOUNTER — Telehealth: Payer: Self-pay | Admitting: Internal Medicine

## 2015-07-18 NOTE — Telephone Encounter (Signed)
Pt called request to speak to Jonelle Sidle concern about prolia injection. Please give her a call back, she said its been a year.   # [phone]

## 2015-07-28 DIAGNOSIS — M1712 Unilateral primary osteoarthritis, left knee: Secondary | ICD-10-CM | POA: Diagnosis not present

## 2015-07-29 ENCOUNTER — Telehealth: Payer: Self-pay

## 2015-07-29 NOTE — Telephone Encounter (Signed)
Patient called requesting Kayla Thompson to call her back about a question she has---i have called patient and left message for her to call Kayla Thompson back

## 2015-07-29 NOTE — Telephone Encounter (Signed)
i have talked with patient, she is trying to find out if prolia injection has been verified with her insurance----she's never been in prolia portal yet, i am having margaret to check on insurance verif and call patient back

## 2015-07-30 ENCOUNTER — Other Ambulatory Visit: Payer: Self-pay | Admitting: Internal Medicine

## 2015-07-30 ENCOUNTER — Other Ambulatory Visit: Payer: Self-pay | Admitting: Pulmonary Disease

## 2015-07-30 DIAGNOSIS — H16223 Keratoconjunctivitis sicca, not specified as Sjogren's, bilateral: Secondary | ICD-10-CM | POA: Diagnosis not present

## 2015-07-31 DIAGNOSIS — M79642 Pain in left hand: Secondary | ICD-10-CM | POA: Diagnosis not present

## 2015-07-31 DIAGNOSIS — M150 Primary generalized (osteo)arthritis: Secondary | ICD-10-CM | POA: Diagnosis not present

## 2015-07-31 DIAGNOSIS — M2550 Pain in unspecified joint: Secondary | ICD-10-CM | POA: Diagnosis not present

## 2015-07-31 DIAGNOSIS — R700 Elevated erythrocyte sedimentation rate: Secondary | ICD-10-CM | POA: Diagnosis not present

## 2015-07-31 DIAGNOSIS — M797 Fibromyalgia: Secondary | ICD-10-CM | POA: Diagnosis not present

## 2015-07-31 DIAGNOSIS — J479 Bronchiectasis, uncomplicated: Secondary | ICD-10-CM | POA: Diagnosis not present

## 2015-07-31 DIAGNOSIS — M353 Polymyalgia rheumatica: Secondary | ICD-10-CM | POA: Diagnosis not present

## 2015-07-31 DIAGNOSIS — M79641 Pain in right hand: Secondary | ICD-10-CM | POA: Diagnosis not present

## 2015-08-01 ENCOUNTER — Ambulatory Visit (INDEPENDENT_AMBULATORY_CARE_PROVIDER_SITE_OTHER): Payer: Medicare Other | Admitting: *Deleted

## 2015-08-01 DIAGNOSIS — M810 Age-related osteoporosis without current pathological fracture: Secondary | ICD-10-CM

## 2015-08-01 MED ORDER — DENOSUMAB 60 MG/ML ~~LOC~~ SOLN
60.0000 mg | Freq: Once | SUBCUTANEOUS | Status: AC
  Administered 2015-08-01: 60 mg via SUBCUTANEOUS

## 2015-08-04 DIAGNOSIS — M1712 Unilateral primary osteoarthritis, left knee: Secondary | ICD-10-CM | POA: Diagnosis not present

## 2015-08-04 NOTE — Telephone Encounter (Signed)
Recd prolia injection 08/01/15

## 2015-08-06 DIAGNOSIS — F4323 Adjustment disorder with mixed anxiety and depressed mood: Secondary | ICD-10-CM | POA: Diagnosis not present

## 2015-08-11 DIAGNOSIS — M1712 Unilateral primary osteoarthritis, left knee: Secondary | ICD-10-CM | POA: Diagnosis not present

## 2015-08-17 ENCOUNTER — Other Ambulatory Visit: Payer: Self-pay | Admitting: Internal Medicine

## 2015-08-19 NOTE — Telephone Encounter (Signed)
Faxed to pharmacy

## 2015-09-02 ENCOUNTER — Ambulatory Visit (INDEPENDENT_AMBULATORY_CARE_PROVIDER_SITE_OTHER): Payer: Medicare Other | Admitting: Gynecology

## 2015-09-02 ENCOUNTER — Encounter: Payer: Self-pay | Admitting: Gynecology

## 2015-09-02 VITALS — BP 120/78 | Ht 59.5 in | Wt 158.0 lb

## 2015-09-02 DIAGNOSIS — L720 Epidermal cyst: Secondary | ICD-10-CM | POA: Insufficient documentation

## 2015-09-02 DIAGNOSIS — Z01419 Encounter for gynecological examination (general) (routine) without abnormal findings: Secondary | ICD-10-CM | POA: Diagnosis not present

## 2015-09-02 DIAGNOSIS — M810 Age-related osteoporosis without current pathological fracture: Secondary | ICD-10-CM

## 2015-09-02 NOTE — Progress Notes (Signed)
Kayla Thompson 22-Dec-1935 [id number]   History:    80 y.o.  for annual gyn exam who presents with no complaints today. She now has established with a PCP here in our community since she moved from [facility] far her last year. Patient with past history of osteoporosis her PCP has her currently on Prolia 60 mg subcutaneous every 6 months. She is also monitor her calcium, vitamin D and PTH levels as well. She is also being treated for hypertension as well as type 2 diabetes and has history of asthma. She is also being treated for hypothyroidism.  Patient had a colonoscopy in 2014 whereby benign polyps had been removed. Bone density study 2016 demonstrated the lowest T score was at the left femoral neck with a value of -2.7 indicating osteoporosis.  GYN history significant for the fact that patient had ovarian cancer patient stated was a stage I and she had a radical hysterectomy   Past medical history,surgical history, family history and social history were all reviewed and documented in the EPIC chart.  Gynecologic History No LMP recorded. Patient has had a hysterectomy. Contraception: status post hysterectomy Last Pap: 2016. Results were: normal Last mammogram: 2017. Results were: normal  Obstetric History OB History  Gravida Para Term Preterm AB SAB TAB Ectopic Multiple Living  1 1        1     # Outcome Date GA Lbr Len/2nd Weight Sex Delivery Anes PTL Lv  1 Para     F Vag-Spont          ROS: A ROS was performed and pertinent positives and negatives are included in the history.  GENERAL: No fevers or chills. HEENT: No change in vision, no earache, sore throat or sinus congestion. NECK: No pain or stiffness. CARDIOVASCULAR: No chest pain or pressure. No palpitations. PULMONARY: No shortness of breath, cough or wheeze. GASTROINTESTINAL: No abdominal pain, nausea, vomiting or diarrhea, melena or bright red blood per rectum. GENITOURINARY: No urinary frequency, urgency, hesitancy or  dysuria. MUSCULOSKELETAL: No joint or muscle pain, no back pain, no recent trauma. DERMATOLOGIC: No rash, no itching, no lesions. ENDOCRINE: No polyuria, polydipsia, no heat or cold intolerance. No recent change in weight. HEMATOLOGICAL: No anemia or easy bruising or bleeding. NEUROLOGIC: No headache, seizures, numbness, tingling or weakness. PSYCHIATRIC: No depression, no loss of interest in normal activity or change in sleep pattern.     Exam: chaperone present  BP 120/78 mmHg  Ht 4' 11.5" (1.511 m)  Wt 158 lb (71.668 kg)  BMI 31.39 kg/m2  Body mass index is 31.39 kg/(m^2).  General appearance : Well developed well nourished female. No acute distress HEENT: Eyes: no retinal hemorrhage or exudates,  Neck supple, trachea midline, no carotid bruits, no thyroidmegaly Lungs: Clear to auscultation, no rhonchi or wheezes, or rib retractions  Heart: Regular rate and rhythm, no murmurs or gallops Breast:Examined in sitting and supine position were symmetrical in appearance, no palpable masses or tenderness,  no skin retraction, no nipple inversion, no nipple discharge, no skin discoloration, no axillary or supraclavicular lymphadenopathy Abdomen: no palpable masses or tenderness, no rebound or guarding Extremities: no edema or skin discoloration or tenderness  Pelvic:  Bartholin, Urethra, Skene Glands: Within normal limits             Vagina: No gross lesions or discharge  Cervix: Absent  Uterus  absent  Adnexa  Without masses or tenderness  Anus and perineum  normal   Rectovaginal  normal sphincter  tone without palpated masses or tenderness             Hemoccult PCP provides     Assessment/Plan:  80 y.o. female for annual exam with past history of ovarian cancer according to patient it was a stage I and she had a radical hysterectomy and had received chemotherapy in [place] with no evidence of recurrent disease. Patient with a small epidermal inclusion cysts of left groin area patient was  reassured. Pap smear no longer needed. PCP we'll be doing her bone density study next year.   Terrance Mass MD, 2:57 PM 09/02/2015

## 2015-09-02 NOTE — Patient Instructions (Signed)
Epidermal Cyst An epidermal cyst is sometimes called a sebaceous cyst, epidermal inclusion cyst, or infundibular cyst. These cysts usually contain a substance that looks "pasty" or "cheesy" and may have a bad smell. This substance is a protein called keratin. Epidermal cysts are usually found on the face, neck, or trunk. They may also occur in the vaginal area or other parts of the genitalia of both men and women. Epidermal cysts are usually small, painless, slow-growing bumps or lumps that move freely under the skin. It is important not to try to pop them. This may cause an infection and lead to tenderness and swelling. CAUSES  Epidermal cysts may be caused by a deep penetrating injury to the skin or a plugged hair follicle, often associated with acne. SYMPTOMS  Epidermal cysts can become inflamed and cause:  Redness.  Tenderness.  Increased temperature of the skin over the bumps or lumps.  Grayish-white, bad smelling material that drains from the bump or lump. DIAGNOSIS  Epidermal cysts are easily diagnosed by your caregiver during an exam. Rarely, a tissue sample (biopsy) may be taken to rule out other conditions that may resemble epidermal cysts. TREATMENT   Epidermal cysts often get better and disappear on their own. They are rarely ever cancerous.  If a cyst becomes infected, it may become inflamed and tender. This may require opening and draining the cyst. Treatment with antibiotics may be necessary. When the infection is gone, the cyst may be removed with minor surgery.  Small, inflamed cysts can often be treated with antibiotics or by injecting steroid medicines.  Sometimes, epidermal cysts become large and bothersome. If this happens, surgical removal in your caregiver's office may be necessary. HOME CARE INSTRUCTIONS  Only take over-the-counter or prescription medicines as directed by your caregiver.  Take your antibiotics as directed. Finish them even if you start to feel  better. SEEK MEDICAL CARE IF:   Your cyst becomes tender, red, or swollen.  Your condition is not improving or is getting worse.  You have any other questions or concerns. MAKE SURE YOU:  Understand these instructions.  Will watch your condition.  Will get help right away if you are not doing well or get worse.   This information is not intended to replace advice given to you by your health care provider. Make sure you discuss any questions you have with your health care provider.   Document Released: 02/07/2004 Document Revised: 05/31/2011 Document Reviewed: 09/14/2010 Elsevier Interactive Patient Education 2016 [employer]  

## 2015-09-03 ENCOUNTER — Other Ambulatory Visit: Payer: Self-pay | Admitting: Internal Medicine

## 2015-09-03 DIAGNOSIS — M353 Polymyalgia rheumatica: Secondary | ICD-10-CM | POA: Diagnosis not present

## 2015-09-03 DIAGNOSIS — J479 Bronchiectasis, uncomplicated: Secondary | ICD-10-CM | POA: Diagnosis not present

## 2015-09-03 DIAGNOSIS — M2550 Pain in unspecified joint: Secondary | ICD-10-CM | POA: Diagnosis not present

## 2015-09-03 DIAGNOSIS — M154 Erosive (osteo)arthritis: Secondary | ICD-10-CM | POA: Diagnosis not present

## 2015-09-03 DIAGNOSIS — M150 Primary generalized (osteo)arthritis: Secondary | ICD-10-CM | POA: Diagnosis not present

## 2015-09-03 DIAGNOSIS — M797 Fibromyalgia: Secondary | ICD-10-CM | POA: Diagnosis not present

## 2015-09-04 NOTE — Telephone Encounter (Signed)
Faxed script back to [facility].../lmb 

## 2015-09-05 ENCOUNTER — Telehealth: Payer: Self-pay | Admitting: Internal Medicine

## 2015-09-05 NOTE — Telephone Encounter (Signed)
Patient called to advise that the med she needs is oxyCODONE-acetaminophen (PERCOCET/ROXICET) 5-325 MG tablet [id number]

## 2015-09-05 NOTE — Telephone Encounter (Signed)
Tried to reach patient. No answer. Left message asking patient to call back to specify which medication she wants filled.

## 2015-09-05 NOTE — Telephone Encounter (Signed)
Pt called in and would like refill on her pain meds

## 2015-09-08 ENCOUNTER — Ambulatory Visit: Payer: Medicare Other | Admitting: Internal Medicine

## 2015-10-01 ENCOUNTER — Ambulatory Visit (INDEPENDENT_AMBULATORY_CARE_PROVIDER_SITE_OTHER): Payer: Medicare Other | Admitting: Pulmonary Disease

## 2015-10-01 ENCOUNTER — Encounter: Payer: Self-pay | Admitting: Pulmonary Disease

## 2015-10-01 VITALS — BP 120/70 | HR 76 | Temp 98.3°F | Ht 60.0 in | Wt 166.5 lb

## 2015-10-01 DIAGNOSIS — M353 Polymyalgia rheumatica: Secondary | ICD-10-CM

## 2015-10-01 DIAGNOSIS — J479 Bronchiectasis, uncomplicated: Secondary | ICD-10-CM | POA: Diagnosis not present

## 2015-10-01 MED ORDER — ALBUTEROL SULFATE (2.5 MG/3ML) 0.083% IN NEBU: 2.5000 mg | INHALATION_SOLUTION | Freq: Every day | RESPIRATORY_TRACT | Status: DC

## 2015-10-01 MED ORDER — DOXYCYCLINE HYCLATE 100 MG PO TABS: 100.0000 mg | ORAL_TABLET | Freq: Two times a day (BID) | ORAL | Status: DC

## 2015-10-01 MED ORDER — FLUTICASONE-SALMETEROL 500-50 MCG/DOSE IN AEPB: 1.0000 | INHALATION_SPRAY | Freq: Two times a day (BID) | RESPIRATORY_TRACT | Status: DC

## 2015-10-01 MED ORDER — METHYLPREDNISOLONE 4 MG PO TBPK: ORAL_TABLET | ORAL | Status: DC

## 2015-10-01 MED ORDER — ALBUTEROL SULFATE HFA 108 (90 BASE) MCG/ACT IN AERS: 2.0000 | INHALATION_SPRAY | Freq: Four times a day (QID) | RESPIRATORY_TRACT | Status: DC | PRN

## 2015-10-01 MED ORDER — MONTELUKAST SODIUM 10 MG PO TABS: ORAL_TABLET | ORAL | Status: DC

## 2015-10-01 NOTE — Patient Instructions (Signed)
Today we updated your med list in our EPIC system...    Continue your current medications the same...  Continue your NEBULIZER, ADVAIR, SPIRIVA, GUAIFENESIN, FLONASE, SINGULAIR as we have outlined...  For your upper resp infection & airway inflammation>    Take the DOXYCYCLINE 100mg  twice daily til gone...    And the MEDROL Dosepak as directed...  Call for any questions...  Let's plan a follow up visit in 24mo, sooner if needed for problems.Marland KitchenMarland Kitchen

## 2015-10-02 ENCOUNTER — Ambulatory Visit: Payer: Medicare Other | Admitting: Pulmonary Disease

## 2015-10-02 NOTE — Progress Notes (Signed)
Subjective:     Patient ID: Kayla Thompson, female   DOB: 19-Mar-1936, 80 y.o.   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  ~  January 07, 2015:  Initial pulmonary consult w/ SN>      8 y/o WF, referred by [name]) for a pulmonary evaluation due to cough & dyspnea>  Joey moved to [place] about 72mo ago to be near her daughter, granddaughter, and great-grands after her husb passed away;  Shortly after arrival she fell & was [facility] w/ fx nasal bones and left wrist (distal radius & ulna) managed by Burnett Sheng summary reviewed... She established Primary Care w/ DrKollar, [place], Endocrine- DrGherghe... She has a Hx of pulmonary problems identified by the Pt & records from [facility] as Bronchiectasis and Asthma;  Her CC is SOB/ DOE w/ min activity & it is apparent that she is rather sedentary and a poor historian; she notes mild cough, some yellow sputum, no hemoptysis; she denies CP, palpit, or signif cardiac history...  Smoking Hx>  She is a never smoker, but had signif 2nd hand exposure from her husb...  Pulmonary Hx>  She indicates hx left lung surg 2000 for benign granuloma; hx bronchiectasis but she does not know how the Dx was established; told asthma as well ever since early 2000's but not as a child or young adult; states she had pneumonia age 46 at same time as the measles (not hosp & made full recovery w/o apparent residual resp problems); she does admit to recurrent bronchitic infections as adult requiring antibiotics & occas Pred rx... She adds a rambling hx of work-ups at [facility] (in [place] in [place] but she does not know what this was for?       Notes from Dr. Mick Sell in [place]> Bronchiectasis, mult pulm nodules, & mild Asthma; prev pulm nodule resected in 2000 which proved to be a granuloma; he reported that an atypical  mycobacterium was found at the time of lung bx in the past- no further details provided; seen for cough w/ yellow sput & incr dyspnea- reported to be stable on Advair500Bid, Spiriva daily, Singulair10, NEB w/ Albut-Bid, Mucinex, VentolinHFA rescue prn; she was also on Pred 3mg /d for PMR from her Rheum;   Medical Hx>  Diastolic Dysfunction on 2DEcho, mild AS, HL, IDDM, Hypothyroid, GERD, DJD w/ TKR and ankle surg; Fibromyalgia on Lyrica/ Percocet/ Tramadol; PMR per Rheum in [place] on Pred2mg /d x 29yr (it was last dropped from 3mg  ~89yr ago); Osteoporosis; Anemia; Anxiety.   Family Hx>  Mother died from lung cancer (as did one of her sisters);  Father passed from heart disease.  Occup Hx>  No known occupational exposures  Current Meds>  SEE FULL MED LIST BELOW EXAM showed Afeb, VSS, O2sat=99% on RA;  Heent- neg, mallampati3;  Chest- decr BS bilat, not congested, w/o w/r/r heard;  Heart- RR gr1/6SEM w/o r/g detected;  Abd- soft, nontender, +panniculus;  Ext- VI w/o c/c/e...   CT Chest 11/17/09 in [place]> mult noncalcif pulm nodules bilat (stable when compared to older scans), mucoid impaction LUL & RML, pleuroparenchymal scarring right base (unchanged), no adenopathy, norm heart size & coronary calcif.  PFTs 02/04/10 in [place]> FVC=2.20 (95%), FEV1=1.42 (88%), %1sec=65, mid-flows reduced at 38% predicted; no improvement in FEV1 after bronchodil; TLC=4.70 (118%), RV=2.48 (153%), RV/TLC=53%; DLCO=54% predicted.  CXR 01/14/14 reported to show norm heart  size, clear lungs, thoracic spondylosis, NAD...   CXR 08/02/14 in ER here after fall showed norm heart size, diffuse interstitial coarsening w/o focal airsp dis...   2DEcho 08/02/14 in [place] after fall showed norm LVF w/ EF=55-60%, no regional wall motion abn, Gr1 DD, mild AS w/ mean gradient 48mmHg; mod calcif MV leaflets w/o stenosis or regurg; RV looked OK but could not est PAsys...  CXR here 01/07/15 showed norm heart size, incr AP diameter of chest & flat  diaphragms, incr markings diffusely, old right rib fxs, osteopenia...   Spirometry 01/07/15> we attempted mult trial of simple spirometry but pt could not generate an acceptable curve & results are not reliable...  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 We will follow closely.    Cardiac> on Coreg6.25Bid for ?HBP; she has mild AS & DiastCHF on 2DEcho and this needs follow up...    Endocrine> IDDM, Hypothyroid, transiently elev serum calcium ?etiology (resolved spont); managed by DrGherghe; BS today is 45 & asked to eat & check w/ endocrine re- adjustment in Novolog...    GYN> Hx ovarian cancer (in remission- details unknown), s/p hyst    Ortho/ Rheum> DJD w/ TKR, FM on Lyrica etc, severe LBP requires epidural shots; PMR on Pred (details unknown); her Sed=52 and she will prob need Rheum to help sort this out...    Heme> Hg 10-11 range May2016 after fall w/ fx nose & wrist; similar now & may need further anemia work-up as well...  ADDENDUM>>  Hi-res CT Chest 01/14/15:  Norm heart size, atherosclerosis of Ao/ great vessels/ coronaries, no adenopathy, atrophic thyroid; ?small sliding pulm hernia betw the lat 7th & 8th ribs w/ scarring; extensive bronchiectasis bilat w/ mucoid impactions within dilated airways (infectious/inflamm bronchiolitis- r/o MAI), 1mm LLL  nodule, tracheobronchomalacia w/ extensive bilat air trapping on expiration, smallHH, old healed right 6th-7th rib fx, DJD Tspine...  REC> continue NEBS, Advair, Spiriva, Mucinex (maximize to 600mg  Qid w/ fluids), practice purse lip breathing to splint the airways open & aide in expectoration of phlegm, use FLUTTER valve & postural drainage to get the sput out; collect sput for routine C&S, AFB, Fungi => TC w/ pt: she has a lot going on, refuses further meds, refuses further eval, can't be bothered w/ learning Chest PT/ Flutter, postural drainage, purse lip breathing, collecting sputum specimen, etc; states she's had all this a long time & not interested in more aggressive pulmonary treatments at this time... She will f/u w/ me in November & we will discuss again.  ADDENDUM>>  Records from Dr. Mick Sell, Pulmonary in [place]=> note of 07/18/13:  Hx mild asthma & bronchiectasis, hx mult pulm nodules followed for several yrs & one removed surgically in 2000 that proved to be a granuloma & report of an atypical mycobacterium being found at the same time (but no details given), on NEB w/ Albuterol,  Advair, Spiriva, Singulair, Mucinex, low-dose Pred for PMR;  She was given all indicated vaccinations;  No change in her med regimen for yrs...   ~  February 19, 2015:  6wk ROV w/ SN>  SEE ABOVE-- pt notes nasal congestion, chest congestion, sl cough & incr SOB for the last wk or so; in the next breath she says "I seldom get a cold", thinks it's all allergies, notes great-grnads recently ill;  She has NEBS w/ Albut (only using once per day), Advair500Bid, Spiriva once daily, Mucinex (?how much), Pred?1mg - 3/d, Singulair10/ Zyrtek10...  Since she was here last she has followed up w/ PCP-DrCrawford 10/25> FM doing ok on Lyrica & she reported more functional & resting better...   She also had a f/u w/ Endocrine-DrGherghe 11/8> DM, osteoporosis, hypothyroid, & Hx hyperpara; Labs reviewed & ok (A1c=6.5 on her insulin,  Ca is wnl and vitD=66)...    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     Cardiac> on Coreg6.25Bid for ?HBP; she has mild AS & DiastCHF on 2DEcho and this needs follow up w/ her PCP vs Cards...    Endocrine> IDDM, Hypothyroid, transiently elev serum calcium ?etiology (resolved spont); managed by DrGherghe...    GYN> Hx ovarian cancer (in remission- details unknown), s/p hyst & Ca125=9 (08/2014)...    Ortho/ Rheum> DJD w/ TKR, FM on Lyrica, Tramadol, Percocet,etc, severe LBP requires epidural shots; PMR on Pred (details unknown); her Sed=52 and she will prob need Rheum to help sort this out...    Heme> Hg 10-11 range May2016 after fall w/ fx nose & wrist; similar now & may need further anemia work-up as well... EXAM showed Afeb, VSS, O2sat=93% on RA;  Wt=162#;  Heent- neg, mallampati3;  Chest- decr BS bilat, not congested, w/o w/r/r heard;  Heart- RR gr1/6SEM w/o r/g detected;  Abd- soft, nontender, +panniculus;  Ext- VI w/o c/c/e...   Pt given a neb treatment in office- unable to produce any sput for cultures... IMP/PLAN>>  We discussed Rx w/ ZPak for her URI (it could also help her if she has atyp-mycobacterium); we compromised rx w/ NEBS TID- followed by AdvairBid & Spiriva once daily, plus the MUCINEX1200mg Bid w/ fluids;  She again declines the added benefit from a vigorous chest PT regimen, flutter valve, postural drainage, purse lip breathing, etc... we plan a recheck in 3 months...  ~  June 23, 2015:  75mo ROV  w/ SN>  Kayla Thompson returns unaccompanied and states that her breathing is good- no problem she says; notes min cough, sm amt whitish sput (no color, no blood), admits to mild SOB/DOE but swears there is no progression, and denies CP/ f/c/s/ etc... She states that she was INTOL to ZPak given last OV but can't recall the reaction ("?didn't work for me"), doesn't want it again;  We reviewed her meds & again she is not being aggressive enough to effectively treat her bronchiectasis & mucoid impactions> eg. Only using the Albut NEBS prn & ave once Qod, only using the Mucinex 1200 once daily, she says she is using the ADVAIR500Bid, SPIRIVA once daily, SINGULAIR10, PRED1mg -2tabs  Qam ?per PCP/ ?Rheum... 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    Cardiac> on Coreg6.25- 1/2Bid for ?HBP; she has mild AS & DiastCHF on 2DEcho and this needs follow up w/ her PCP vs Cards...    Endocrine> IDDM on Lantus & Novolog (A1c=6.3), HL on Crestor10 (FLP- controlled), Hypothyroid on Synthroid50, transiently elev serum calcium ?etiology (resolved spont), osteoporosis w/ mult fxs on Prolia Q55mo; managed by DrGherghe & last seen 06/09/15- note reviewed...    GYN> Hx ovarian cancer (in remission- details unknown), s/p hyst & Ca125=9 (08/2014)...    Ortho/ Rheum> DJD w/ TKR, FM on Lyrica,  Tramadol, Percocet,etc, severe LBP requires epidural shots; she is on a huge dose of Lyrica; PMR on Pred (details unknown); her Sed=52 and she will prob need Rheum to help sort this out...    Neuro> on Aricept5 per DrCrawford, I wonder if she is taking any/ all of her meds properly, no care giver accompanies pt to office visits, she is ?stoic ?oblivious...    Heme> Hg 10-11 range May2016 after fall w/ fx nose & wrist; similar now & may need further anemia work-up as well, deferred to PCP... EXAM showed Afeb, VSS, O2sat=98% on RA;  Wt=161#;  Heent- neg, mallampati3;  Chest- decr BS bilat, not congested, w/o w/r/r heard;  Heart- RR gr1/6SEM w/o r/g detected;  Abd- soft, nontender, +panniculus;  Ext- VI w/o c/c/e...   LABS 2016:  Reviewed... IMP/PLAN>>  We again outlined for Kayla Thompson a vigorous home treatment regimen for her bronchiectasis & mucoid impactions including a TID regimen>  NEB w/ Albut vs Duoneb Tid, followed by her Advair500Bid & Spiriva once/d, Guaifenesin 400mg  tabs- 2Tid w/ fluids, take her low dose Pred in AM & Singulair in PM; she declines to do chest PT/ Flutter & postural drainage treatments...  ~  October 01, 2015:  42mo ROV & add-on appt requested by pt for COPD exac>  She reports that she went to a granddaughter's wedding in [place] 6/24, then spent 1wk in [place] and "caught a cold", didn't go to UC or see a physician, just "toughed it out", getting better slowly but symptoms lingering she says> head is woozy, nasal drainage, sl cough, white sput (no discoloration or blood), sl tight/ wheezy/ congested but no CP/ f/c/s, etc... She reminds me that "ZPak doesn't work for me", and she wants Doxy;  It is apparent that she is still NOT doing her Resp treatment regimen as I have prev requested & outlined for her (see below)- acts as if this is the first time she has heard this from me, she remains on Aricept5 from PCP & has not seen Neuro...we reviewed the following medical problems during today's office  visit >>     Pulmonary> Hx COPD/ Bronchiectasis/ Abn CT Chest w/ mucoid impactions, tracheobronchomalacia w/ air trapping, 10mm LLL nodule, Hx granulomatous lung dis & reported atyp mycobacterium in  the past>  See Hi-res CTChest 12/2014;  We encouraged a regular bronchodilator regimen (NEBS w/ Albut vs Duoneb Tid, ADVAIR500Bid, SPIRIVA daily), Guaifenesin 400mg -2Tid, Singulair10, Fluids, Flutter/ postural drainage/ purse-lip breathing exercises, & check sput for cultures but she could not be bothered & refused further pulm treatment/ testing sput/ etc=> still not cooperating w/ max chest physiotherapy regimen & states she only gets a sm amt of whitish sput (unable to successfully induce sput in office...    Cardiac> on Coreg6.25- 1/2Bid for ?HBP; she has mild AS & DiastCHF on 2DEcho and this needs follow up w/ her PCP vs Cards...    Endocrine> IDDM on Lantus & Novolog (A1c=6.3), HL on Crestor10 (FLP- controlled), Hypothyroid on Synthroid50, transiently elev serum calcium ?etiology (resolved spont), osteoporosis w/ mult fxs on Prolia Q53mo; managed by DrGherghe & last seen 06/09/15- note reviewed...    GYN> Hx ovarian cancer (in remission- details unknown), s/p hyst & Ca125=9 (08/2014)... She is followed by DrFernandez now...    Ortho/ Rheum> DJD w/ TKR, FM on Lyrica, Tramadol, Percocet,etc, severe LBP requires epidural shots; she is on a huge dose of Lyrica; PMR on Pred (details unknown); her Sed=52 and she saw [facility] for Rheum last 09/03/15- severe OA, FM, PMR, & osteoporosis on Lyrica, Pred, Percocet, & Prolia; she felt worse when out of her Lyrica    Neuro> on Aricept5 per DrCrawford, I wonder if she is taking any/ all of her meds properly, no care giver accompanies pt to office visits, she is ?stoic ?oblivious...    Heme> Hg 10-11 range May2016 after fall w/ fx nose & wrist; similar now & may need further anemia work-up as well, deferred to PCP... EXAM showed Afeb, VSS, O2sat=96% on RA;  Wt=167#;  Heent-  neg, mallampati3;  Chest- decr BS bilat, not congested, w/o w/r/r heard;  Heart- RR gr1/6SEM w/o r/g detected;  Abd- soft, nontender, +panniculus;  Ext- VI w/o c/c/e...  IMP/PLAN>>  Kayla Thompson has a URI & mild COPD exac=> we will treat w/ Doxy100Bid (her request) and Medrol dosepak;  We again reviewed the recommended treatment protocol for her bronchiectasis (it was like she heard it for the 1st time)>  NEBS Tid, followed by [facility] & SPIRIVA daily, +Guaifenesin400-2Tid + Fluids, take the low dose Pred in AM (per Rheum) and the Singulair10 in PM, work to expectorate the phlegm but she declines to do chest PT, Flutter, postural drainage, etc... we plan ROV recheck in 87mo...    Past Medical History  Diagnosis Date  . Hypertension   . Diabetes mellitus without complication ([place])   . Asthma   . Ovarian cancer ([place])   . Fibromyalgia   . Osteoporosis   . Arthritis   . GERD (gastroesophageal reflux disease)   . History of chemotherapy   . History of fractured pelvis   . Osteoporosis     Past Surgical History  Procedure Laterality Date  . Hip surgey    . Knee surgey    . Lung surgery >> resection of nodule 2000, proved to be a benign granuloma & ?atyp mycobacterium ident at the same time?    . Bladder suspension    . Ankle fracture surgery      plate and screws  . Abdominal hysterectomy  1996    Outpatient Encounter Prescriptions as of 10/01/2015  Medication Sig  . albuterol (PROVENTIL) (2.5 MG/3ML) 0.083% nebulizer solution Take 3 mLs (2.5 mg total) by nebulization daily.  . carvedilol (COREG) 6.25 MG tablet Take 3.125 mg by  mouth 2 (two) times daily with a meal.   . denosumab (PROLIA) 60 MG/ML SOLN injection Inject 60 mg into the skin every 6 (six) months. Administer in upper arm, thigh, or abdomen  . Dexlansoprazole 30 MG capsule Take 1 capsule (30 mg total) by mouth daily.  Marland Kitchen Dextromethorphan-Guaifenesin (MUCINEX DM MAXIMUM STRENGTH) 60-1200 MG TB12 Take 1 tablet by mouth 2 (two)  times daily as needed (for cough/phlegm). Reported on 06/23/2015  . donepezil (ARICEPT) 5 MG tablet TAKE 1 TABLET (5 MG TOTAL) BY MOUTH AT BEDTIME.  . DULoxetine (CYMBALTA) 60 MG capsule Take 60 mg by mouth daily.  . fluticasone (FLONASE) 50 MCG/ACT nasal spray Place 1-2 sprays into both nostrils daily as needed for allergies or rhinitis.  . Fluticasone-Salmeterol (ADVAIR DISKUS) 500-50 MCG/DOSE AEPB Inhale 1 puff into the lungs 2 (two) times daily.  Marland Kitchen glucose blood (FREESTYLE TEST STRIPS) test strip Use to test blood sugar 3 times daily. Dx: E11.65  . insulin aspart (NOVOLOG) 100 UNIT/ML injection Inject 4-6 Units into the skin daily before breakfast. Pens. (Patient taking differently: Inject 4 Units into the skin daily before breakfast. Pens.)  . Insulin Glargine (LANTUS SOLOSTAR) 100 UNIT/ML Solostar Pen Inject 30 Units into the skin every morning. (Patient taking differently: Inject 20 Units into the skin every morning. )  . Insulin Pen Needle 32G X 4 MM MISC Use 4x a day  . Lancets (FREESTYLE) lancets Use to test blood sugar 3 times daily. Dx: E11.65  . levothyroxine (SYNTHROID, LEVOTHROID) 50 MCG tablet Take 1 tablet (50 mcg total) by mouth daily before breakfast.  . LYRICA 300 MG capsule TAKE ONE CAPSULE BY MOUTH TWO TIMES DAILY  . montelukast (SINGULAIR) 10 MG tablet TAKE 1 TABLET (10 MG TOTAL) BY MOUTH DAILY.  . NON FORMULARY 1 Syringe by Epidural route every 3 (three) months. Epidural Steroid Injections for Lumbar Spinal Stenosis up to 4 times a year  . oxyCODONE-acetaminophen (PERCOCET/ROXICET) 5-325 MG tablet Take 1 tablet by mouth 2 (two) times daily as needed for severe pain.  . predniSONE (DELTASONE) 1 MG tablet Take 2 mg by mouth daily with breakfast.   . ranitidine (ZANTAC) 150 MG tablet Take 150 mg by mouth 2 (two) times daily as needed for heartburn.  . rosuvastatin (CRESTOR) 10 MG tablet Take 1 tablet (10 mg total) by mouth every evening.  Marland Kitchen SPIRIVA HANDIHALER 18 MCG inhalation  capsule PLACE 1 CAPSULE (18 MCG TOTAL) INTO INHALER AND INHALE DAILY.  Marland Kitchen zolpidem (AMBIEN) 5 MG tablet Take 1 tablet (5 mg total) by mouth at bedtime. (Patient taking differently: Take 5 mg by mouth at bedtime as needed. )  . [DISCONTINUED] albuterol (PROVENTIL) (2.5 MG/3ML) 0.083% nebulizer solution Take 2.5 mg by nebulization daily.  . [DISCONTINUED] Fluticasone-Salmeterol (ADVAIR DISKUS) 500-50 MCG/DOSE AEPB Inhale 1 puff into the lungs 2 (two) times daily.  . [DISCONTINUED] montelukast (SINGULAIR) 10 MG tablet TAKE 1 TABLET (10 MG TOTAL) BY MOUTH DAILY.  Marland Kitchen albuterol (PROVENTIL HFA;VENTOLIN HFA) 108 (90 Base) MCG/ACT inhaler Inhale 2 puffs into the lungs every 6 (six) hours as needed for wheezing or shortness of breath.  . doxycycline (VIBRA-TABS) 100 MG tablet Take 1 tablet (100 mg total) by mouth 2 (two) times daily.  . methylPREDNISolone (MEDROL DOSEPAK) 4 MG TBPK tablet Take as directed   No facility-administered encounter medications on file as of 10/01/2015.    Allergies  Allergen Reactions  . Latex Rash  . Penicillins Rash    Current Medications, Allergies, Past Medical History,  Past Surgical History, Family History, and Social History were reviewed in [employer] record.   Review of Systems             All symptoms NEG except where BOLDED >>  Constitutional:  F/C/S, fatigue, anorexia, unexpected weight change. HEENT:  HA, visual changes, hearing loss, earache, nasal symptoms, sore throat, mouth sores, hoarseness. Resp:  cough, sputum, hemoptysis; SOB, tightness, wheezing. Cardio:  CP, palpit, DOE, orthopnea, edema. GI:  N/V/D/C, blood in stool; reflux, abd pain, distention, gas. GU:  dysuria, freq, urgency, hematuria, flank pain, voiding difficulty. MS:  joint pain, swelling, tenderness, decr ROM; neck pain, back pain, etc. Neuro:  HA, tremors, seizures, dizziness, syncope, weakness, numbness, gait abn. Skin:  suspicious lesions or skin rash. Heme:   adenopathy, bruising, bleeding. Psyche:  confusion, agitation, sleep disturbance, hallucinations, anxiety, depression suicidal.   Objective:   Physical Exam       Vital Signs:  Reviewed...  General:  WD, WN, 80 y/o WF in NAD; alert & oriented; pleasant & cooperative... HEENT:  /AT; Conjunctiva- pink, Sclera- nonicteric, EOM-wnl, PERRLA, EACs-clear, TMs-wnl; NOSE-clear; THROAT-clear & wnl. Neck:  Supple w/ fairROM; no JVD; normal carotid impulses w/o bruits; no thyromegaly or nodules palpated; no lymphadenopathy. Chest:  Decr BS bilat, few scat rhonchi otherw clear not congested no wheezing rales or signs of consolidation... Heart:  Regular Rhythm; norm S1 & S2 w/ Gr1/6 SEM w/o rubs or gallops detected... Abdomen:  Soft & nontender, +panniculus- no guarding or rebound; normal bowel sounds; no organomegaly or masses palpated. Ext:  decrROM; without deformities +arthritic changes; +venous insuffic, no edema;  Pulses intact w/o bruits. Neuro:  No focal neuro deficit; abn gait & balance poor, uses cane... Derm:  No lesions noted; no rash etc. Lymph:  No cervical, supraclavicular, axillary, or inguinal adenopathy palpated.   Assessment:      IMP >>  Complex 80 y/o woman:    Pulmonary> Hx COPD/ Bronchiectasis/ Abn CT Chest w/ mucoid impactions, tracheobronchomalacia w/ air trapping, 4mm LLL nodule, Hx granulomatous lung dis & reported atyp mycobacterium in the past>  See Hi-res CTChest 12/2014;  We encouraged a regular bronchodilator regimen (NEBS w/ Albut vs Duoneb Tid, ADVAIR500Bid, SPIRIVA daily), Guaifenesin 400mg -2Tid, Singulair10, Fluids, Flutter/ postural drainage/ purse-lip breathing exercises, & check sput for cultures but she could not be bothered & refused further pulm treatment/ testing sput/ etc=> still not cooperating w/ max chest physiotherapy regimen & states she only gets a sm amt of whitish sput (unable to successfully induce sput in office...    Cardiac> on Coreg6.25- 1/2Bid  for ?HBP; she has mild AS & DiastCHF on 2DEcho and this needs follow up w/ her PCP vs Cards...    Endocrine> IDDM on Lantus & Novolog (A1c=6.3), HL on Crestor10 (FLP- controlled), Hypothyroid on Synthroid50, transiently elev serum calcium ?etiology (resolved spont), osteoporosis w/ mult fxs on Prolia Q72mo; managed by DrGherghe & last seen 06/09/15- note reviewed...    GYN> Hx ovarian cancer (in remission- details unknown), s/p hyst & Ca125=9 (08/2014)... She is followed by DrFernandez now...    Ortho/ Rheum> DJD w/ TKR, FM on Lyrica, Tramadol, Percocet,etc, severe LBP requires epidural shots; she is on a huge dose of Lyrica; PMR on Pred (details unknown); her Sed=52 and she saw [facility] for Rheum last 09/03/15- severe OA, FM, PMR, & osteoporosis on Lyrica, Pred, Percocet, & Prolia; she felt worse when out of her Lyrica    Neuro> on Aricept5 per DrCrawford, I wonder if she  is taking any/ all of her meds properly, no care giver accompanies pt to office visits, she is ?stoic ?oblivious...    Heme> Hg 10-11 range May2016 after fall w/ fx nose & wrist; similar now & may need further anemia work-up as well, deferred to PCP...  PLAN >>   01/19/15>   We discussed Rx w/ ZPak for her URI (it could also help her if she has atyp-mycobacterium); we compromised rx w/ NEBS TID- followed by AdvairBid & Spiriva once daily, plus the MUCINEX1200mg Bid w/ fluids;  She again declines the added benefit from a vigorous chest PT regimen, flutter valve, postural drainage, purse lip breathing, etc... 06/23/15>   We again outlined for Kayla Thompson a vigorous home treatment regimen for her bronchiectasis & mucoid impactions including a TID regimen>  NEB w/ Albut vs Duoneb Tid, followed by her Advair500Bid & Spiriva once/d, Guaifenesin 400mg  tabs- 2Tid w/ fluids, take her low dose Pred in AM & Singulair in PM; she declines to do chest PT/ Flutter & postural drainage treatments... 10/01/15>   Kayla Thompson has a URI & mild COPD exac=> we will treat w/  Doxy100Bid (her request) and Medrol dosepak;  We again reviewed the recommended treatment protocol for her bronchiectasis (it was like she heard it for the 1st time)>  NEBS Tid, followed by [facility] & SPIRIVA daily, +Guaifenesin400-2Tid + Fluids, take the low dose Pred in AM (per Rheum) and the Singulair10 in PM, work to expectorate the phlegm but she declines to do chest PT, Flutter, postural drainage, etc... we plan ROV recheck in 26mo...     Plan:     Patient's Medications  New Prescriptions   ALBUTEROL (PROVENTIL HFA;VENTOLIN HFA) 108 (90 BASE) MCG/ACT INHALER    Inhale 2 puffs into the lungs every 6 (six) hours as needed for wheezing or shortness of breath.   DOXYCYCLINE (VIBRA-TABS) 100 MG TABLET    Take 1 tablet (100 mg total) by mouth 2 (two) times daily.   METHYLPREDNISOLONE (MEDROL DOSEPAK) 4 MG TBPK TABLET    Take as directed  Previous Medications   CARVEDILOL (COREG) 6.25 MG TABLET    Take 3.125 mg by mouth 2 (two) times daily with a meal.    DENOSUMAB (PROLIA) 60 MG/ML SOLN INJECTION    Inject 60 mg into the skin every 6 (six) months. Administer in upper arm, thigh, or abdomen   DEXLANSOPRAZOLE 30 MG CAPSULE    Take 1 capsule (30 mg total) by mouth daily.   DEXTROMETHORPHAN-GUAIFENESIN (MUCINEX DM MAXIMUM STRENGTH) 60-1200 MG TB12    Take 1 tablet by mouth 2 (two) times daily as needed (for cough/phlegm). Reported on 06/23/2015   DONEPEZIL (ARICEPT) 5 MG TABLET    TAKE 1 TABLET (5 MG TOTAL) BY MOUTH AT BEDTIME.   DULOXETINE (CYMBALTA) 60 MG CAPSULE    Take 60 mg by mouth daily.   FLUTICASONE (FLONASE) 50 MCG/ACT NASAL SPRAY    Place 1-2 sprays into both nostrils daily as needed for allergies or rhinitis.   GLUCOSE BLOOD (FREESTYLE TEST STRIPS) TEST STRIP    Use to test blood sugar 3 times daily. Dx: E11.65   INSULIN ASPART (NOVOLOG) 100 UNIT/ML INJECTION    Inject 4-6 Units into the skin daily before breakfast. Pens.   INSULIN GLARGINE (LANTUS SOLOSTAR) 100 UNIT/ML SOLOSTAR PEN     Inject 30 Units into the skin every morning.   INSULIN PEN NEEDLE 32G X 4 MM MISC    Use 4x a day   LANCETS (FREESTYLE) LANCETS  Use to test blood sugar 3 times daily. Dx: E11.65   LEVOTHYROXINE (SYNTHROID, LEVOTHROID) 50 MCG TABLET    Take 1 tablet (50 mcg total) by mouth daily before breakfast.   LYRICA 300 MG CAPSULE    TAKE ONE CAPSULE BY MOUTH TWO TIMES DAILY   NON FORMULARY    1 Syringe by Epidural route every 3 (three) months. Epidural Steroid Injections for Lumbar Spinal Stenosis up to 4 times a year   OXYCODONE-ACETAMINOPHEN (PERCOCET/ROXICET) 5-325 MG TABLET    Take 1 tablet by mouth 2 (two) times daily as needed for severe pain.   PREDNISONE (DELTASONE) 1 MG TABLET    Take 2 mg by mouth daily with breakfast.    RANITIDINE (ZANTAC) 150 MG TABLET    Take 150 mg by mouth 2 (two) times daily as needed for heartburn.   ROSUVASTATIN (CRESTOR) 10 MG TABLET    Take 1 tablet (10 mg total) by mouth every evening.   SPIRIVA HANDIHALER 18 MCG INHALATION CAPSULE    PLACE 1 CAPSULE (18 MCG TOTAL) INTO INHALER AND INHALE DAILY.   ZOLPIDEM (AMBIEN) 5 MG TABLET    Take 1 tablet (5 mg total) by mouth at bedtime.  Modified Medications   Modified Medication Previous Medication   ALBUTEROL (PROVENTIL) (2.5 MG/3ML) 0.083% NEBULIZER SOLUTION albuterol (PROVENTIL) (2.5 MG/3ML) 0.083% nebulizer solution      Take 3 mLs (2.5 mg total) by nebulization daily.    Take 2.5 mg by nebulization daily.   FLUTICASONE-SALMETEROL (ADVAIR DISKUS) 500-50 MCG/DOSE AEPB Fluticasone-Salmeterol (ADVAIR DISKUS) 500-50 MCG/DOSE AEPB      Inhale 1 puff into the lungs 2 (two) times daily.    Inhale 1 puff into the lungs 2 (two) times daily.   MONTELUKAST (SINGULAIR) 10 MG TABLET montelukast (SINGULAIR) 10 MG tablet      TAKE 1 TABLET (10 MG TOTAL) BY MOUTH DAILY.    TAKE 1 TABLET (10 MG TOTAL) BY MOUTH DAILY.  Discontinued Medications   No medications on file

## 2015-10-13 DIAGNOSIS — M19012 Primary osteoarthritis, left shoulder: Secondary | ICD-10-CM | POA: Diagnosis not present

## 2015-10-13 DIAGNOSIS — M1712 Unilateral primary osteoarthritis, left knee: Secondary | ICD-10-CM | POA: Diagnosis not present

## 2015-10-16 ENCOUNTER — Telehealth: Payer: Self-pay | Admitting: Pulmonary Disease

## 2015-10-16 MED ORDER — CIPROFLOXACIN HCL 500 MG PO TABS: 500.0000 mg | ORAL_TABLET | Freq: Two times a day (BID) | ORAL | 0 refills | Status: DC

## 2015-10-16 NOTE — Telephone Encounter (Signed)
Spoke with patient, c/o cough with mucus production.  t states that the mucus is very pale yellow.  Runny nose and pt states that her head is not congested but she has a lot of mucus running from her nose.  Pt requesting Cipro 500mg  BID Pt states that the most recent treatment of Doxy 100mg  and Medrol dose pak given on 10/01/15 did not give much relief.   Allergies  Allergen Reactions  . Latex Rash  . Penicillins Rash     Medication List       Accurate as of 10/16/15 12:37 PM. Always use your most recent med list.          albuterol (2.5 MG/3ML) 0.083% nebulizer solution Commonly known as:  PROVENTIL Take 3 mLs (2.5 mg total) by nebulization daily.   albuterol 108 (90 Base) MCG/ACT inhaler Commonly known as:  PROVENTIL HFA;VENTOLIN HFA Inhale 2 puffs into the lungs every 6 (six) hours as needed for wheezing or shortness of breath.   carvedilol 6.25 MG tablet Commonly known as:  COREG Take 3.125 mg by mouth 2 (two) times daily with a meal.   denosumab 60 MG/ML Soln injection Commonly known as:  PROLIA Inject 60 mg into the skin every 6 (six) months. Administer in upper arm, thigh, or abdomen   Dexlansoprazole 30 MG capsule Take 1 capsule (30 mg total) by mouth daily.   donepezil 5 MG tablet Commonly known as:  ARICEPT TAKE 1 TABLET (5 MG TOTAL) BY MOUTH AT BEDTIME.   doxycycline 100 MG tablet Commonly known as:  VIBRA-TABS Take 1 tablet (100 mg total) by mouth 2 (two) times daily.   DULoxetine 60 MG capsule Commonly known as:  CYMBALTA Take 60 mg by mouth daily.   fluticasone 50 MCG/ACT nasal spray Commonly known as:  FLONASE Place 1-2 sprays into both nostrils daily as needed for allergies or rhinitis.   Fluticasone-Salmeterol 500-50 MCG/DOSE Aepb Commonly known as:  ADVAIR DISKUS Inhale 1 puff into the lungs 2 (two) times daily.   freestyle lancets Use to test blood sugar 3 times daily. Dx: E11.65   glucose blood test strip Commonly known as:  FREESTYLE  TEST STRIPS Use to test blood sugar 3 times daily. Dx: E11.65   insulin aspart 100 UNIT/ML injection Commonly known as:  novoLOG Inject 4-6 Units into the skin daily before breakfast. Pens.   Insulin Glargine 100 UNIT/ML Solostar Pen Commonly known as:  LANTUS SOLOSTAR Inject 30 Units into the skin every morning.   Insulin Pen Needle 32G X 4 MM Misc Use 4x a day   levothyroxine 50 MCG tablet Commonly known as:  SYNTHROID, LEVOTHROID Take 1 tablet (50 mcg total) by mouth daily before breakfast.   LYRICA 300 MG capsule Generic drug:  pregabalin TAKE ONE CAPSULE BY MOUTH TWO TIMES DAILY   methylPREDNISolone 4 MG Tbpk tablet Commonly known as:  MEDROL DOSEPAK Take as directed   montelukast 10 MG tablet Commonly known as:  SINGULAIR TAKE 1 TABLET (10 MG TOTAL) BY MOUTH DAILY.   MUCINEX DM MAXIMUM STRENGTH 60-1200 MG Tb12 Take 1 tablet by mouth 2 (two) times daily as needed (for cough/phlegm). Reported on 06/23/2015   NON FORMULARY 1 Syringe by Epidural route every 3 (three) months. Epidural Steroid Injections for Lumbar Spinal Stenosis up to 4 times a year   oxyCODONE-acetaminophen 5-325 MG tablet Commonly known as:  PERCOCET/ROXICET Take 1 tablet by mouth 2 (two) times daily as needed for severe pain.   predniSONE 1 MG tablet  Commonly known as:  DELTASONE Take 2 mg by mouth daily with breakfast.   ranitidine 150 MG tablet Commonly known as:  ZANTAC Take 150 mg by mouth 2 (two) times daily as needed for heartburn.   rosuvastatin 10 MG tablet Commonly known as:  CRESTOR Take 1 tablet (10 mg total) by mouth every evening.   SPIRIVA HANDIHALER 18 MCG inhalation capsule Generic drug:  tiotropium PLACE 1 CAPSULE (18 MCG TOTAL) INTO INHALER AND INHALE DAILY.   zolpidem 5 MG tablet Commonly known as:  AMBIEN Take 1 tablet (5 mg total) by mouth at bedtime.      Please advise Dr Lenna Gilford. Thanks.

## 2015-10-16 NOTE — Telephone Encounter (Signed)
Per SN--  Ok for pt to have cipro 500mg   #14  1 po bid per pts request. Called and spoke with pt and she is aware of this and nothing further is needed.

## 2015-10-17 ENCOUNTER — Telehealth: Payer: Self-pay | Admitting: Emergency Medicine

## 2015-10-17 DIAGNOSIS — H16223 Keratoconjunctivitis sicca, not specified as Sjogren's, bilateral: Secondary | ICD-10-CM | POA: Diagnosis not present

## 2015-10-17 MED ORDER — ZOLPIDEM TARTRATE 5 MG PO TABS: 5.0000 mg | ORAL_TABLET | Freq: Every day | ORAL | 2 refills | Status: DC

## 2015-10-17 NOTE — Telephone Encounter (Signed)
Pt called and needs a refill on zolpidem (AMBIEN) 5 MG tablet. Patient has an appt on 10/21/15. Please follow up thanks.

## 2015-10-17 NOTE — Telephone Encounter (Signed)
Sent to pharmacy 

## 2015-10-20 DIAGNOSIS — M25512 Pain in left shoulder: Secondary | ICD-10-CM | POA: Diagnosis not present

## 2015-10-20 DIAGNOSIS — M19012 Primary osteoarthritis, left shoulder: Secondary | ICD-10-CM | POA: Diagnosis not present

## 2015-10-21 ENCOUNTER — Other Ambulatory Visit (INDEPENDENT_AMBULATORY_CARE_PROVIDER_SITE_OTHER): Payer: Medicare Other

## 2015-10-21 ENCOUNTER — Encounter: Payer: Self-pay | Admitting: Internal Medicine

## 2015-10-21 ENCOUNTER — Ambulatory Visit (INDEPENDENT_AMBULATORY_CARE_PROVIDER_SITE_OTHER): Payer: Medicare Other | Admitting: Internal Medicine

## 2015-10-21 ENCOUNTER — Telehealth: Payer: Self-pay | Admitting: *Deleted

## 2015-10-21 VITALS — BP 122/60 | HR 93 | Temp 98.0°F | Resp 18 | Ht 60.0 in | Wt 162.0 lb

## 2015-10-21 DIAGNOSIS — E038 Other specified hypothyroidism: Secondary | ICD-10-CM

## 2015-10-21 DIAGNOSIS — E119 Type 2 diabetes mellitus without complications: Secondary | ICD-10-CM

## 2015-10-21 DIAGNOSIS — Z794 Long term (current) use of insulin: Secondary | ICD-10-CM

## 2015-10-21 DIAGNOSIS — J209 Acute bronchitis, unspecified: Secondary | ICD-10-CM

## 2015-10-21 DIAGNOSIS — J4530 Mild persistent asthma, uncomplicated: Secondary | ICD-10-CM | POA: Diagnosis not present

## 2015-10-21 LAB — COMPREHENSIVE METABOLIC PANEL
ALT: 15 U/L (ref 0–35)
AST: 11 U/L (ref 0–37)
Albumin: 3.6 g/dL (ref 3.5–5.2)
Alkaline Phosphatase: 41 U/L (ref 39–117)
BUN: 23 mg/dL (ref 6–23)
CO2: 30 mEq/L (ref 19–32)
Calcium: 9.2 mg/dL (ref 8.4–10.5)
Chloride: 104 mEq/L (ref 96–112)
Creatinine, Ser: 0.88 mg/dL (ref 0.40–1.20)
GFR: 65.99 mL/min (ref >60.00)
Glucose, Bld: 98 mg/dL (ref 70–99)
Potassium: 4.4 mEq/L (ref 3.5–5.1)
Sodium: 139 mEq/L (ref 135–145)
Total Bilirubin: 0.3 mg/dL (ref 0.2–1.2)
Total Protein: 6.6 g/dL (ref 6.0–8.3)

## 2015-10-21 LAB — HEMOGLOBIN A1C: Hgb A1c MFr Bld: 7.2 % — ABNORMAL HIGH (ref 4.6–6.5)

## 2015-10-21 LAB — CBC
HCT: 32 % — ABNORMAL LOW (ref 36.0–46.0)
Hemoglobin: 10.3 g/dL — ABNORMAL LOW (ref 12.0–15.0)
MCHC: 32.2 g/dL (ref 30.0–36.0)
MCV: 82.8 fl (ref 78.0–100.0)
Platelets: 302 10*3/uL (ref 150.0–400.0)
RBC: 3.87 Mil/uL (ref 3.87–5.11)
RDW: 17.9 % — ABNORMAL HIGH (ref 11.5–15.5)
WBC: 18.6 10*3/uL (ref 4.0–10.5)

## 2015-10-21 LAB — TSH: TSH: 0.61 u[IU]/mL (ref 0.35–4.50)

## 2015-10-21 MED ORDER — OXYCODONE-ACETAMINOPHEN 5-325 MG PO TABS: 1.0000 | ORAL_TABLET | Freq: Two times a day (BID) | ORAL | 0 refills | Status: DC | PRN

## 2015-10-21 MED ORDER — LEVOTHYROXINE SODIUM 50 MCG PO TABS: 50.0000 ug | ORAL_TABLET | Freq: Every day | ORAL | 3 refills | Status: DC

## 2015-10-21 NOTE — Assessment & Plan Note (Signed)
Did not respond to doxycycline and increasing her steroids. She is now taking ciprofloxacin and if that does not help she needs cxr.

## 2015-10-21 NOTE — Progress Notes (Signed)
Pre visit review using our clinic review tool, if applicable. No additional management support is needed unless otherwise documented below in the visit note. 

## 2015-10-21 NOTE — Telephone Encounter (Signed)
Patient's WBC count is critical at 18.6. PCP verbally informed as well.

## 2015-10-21 NOTE — Patient Instructions (Signed)
We have sent in the refills for your medicines except the breathing medicines. Those Dr. Lenna Gilford should fill and you can call their office or stop upstairs for a refill.   If you are not better after the ciprofloxacin call back Dr. Lenna Gilford and have him order a chest x-ray.

## 2015-10-21 NOTE — Progress Notes (Deleted)
Subjective:     Patient ID: Kayla Thompson, female   DOB: 08/30/1935, 80 y.o.   MRN: [id number]  HPI   Review of Systems     Objective:   Physical Exam     Assessment:     ***    Plan:     ***

## 2015-10-21 NOTE — Progress Notes (Signed)
   Subjective:    Patient ID: Kayla Thompson, female    DOB: [birth year], 80 y.o.   MRN: [id number]  HPI The patient is a 80 YO female coming in for cough and SOB. This is ongoing for the last 3 weeks. She saw her pulmonary doctor about 2 weeks ago and they gave her antibiotics and steroids which did not help much. They then called in another antibiotic for her ciprofloxacin. She is taking this and is on day 2 or so (7 days called in). She is still feeling poorly and mild SOB with exertion. She is also needing follow up on some of her other medical problems and we are not able to get to all of them. Please see A/P for details. Her shoulder is better and her knee is not improved (seeing ortho).  Kayla Thompson copy of blood work   Review of Systems  Constitutional: Positive for activity change. Negative for appetite change, fatigue, fever and unexpected weight change.  HENT: Positive for congestion, postnasal drip, rhinorrhea, sinus pressure and sore throat.   Eyes: Negative.   Respiratory: Positive for cough, chest tightness and shortness of breath. Negative for wheezing.   Cardiovascular: Negative for chest pain, palpitations and leg swelling.  Gastrointestinal: Negative for abdominal distention, abdominal pain, constipation and diarrhea.  Musculoskeletal: Positive for arthralgias, back pain and myalgias.  Skin: Negative.   Neurological: Negative.   Psychiatric/Behavioral: Negative for decreased concentration, dysphoric mood, self-injury, sleep disturbance and suicidal ideas. The patient is not nervous/anxious.       Objective:   Physical Exam  Constitutional: She is oriented to person, place, and time. She appears well-developed and well-nourished.  Overweight  HENT:  Head: Normocephalic and atraumatic.  Right Ear: External ear normal.  Left Ear: External ear normal.  Oropharynx with redness and drainage, no crusting nose  Eyes: EOM are normal.  Neck: Normal range of motion.    Cardiovascular: Normal rate and regular rhythm.   Pulmonary/Chest: Effort normal. No respiratory distress. She has wheezes. She has no rales. She exhibits tenderness.  Mild wheezing LUL, no rhonchi and moving air well  Abdominal: Soft. Bowel sounds are normal. She exhibits no distension. There is no tenderness. There is no rebound.  Neurological: She is alert and oriented to person, place, and time. Coordination normal.  Skin: Skin is warm and dry.   Vitals:   10/21/15 1103  BP: 122/60  Pulse: 93  Resp: 18  Temp: 98 F (36.7 C)  TempSrc: Oral  SpO2: 97%  Weight: 162 lb (73.5 kg)  Height: 5' (1.524 m)      Assessment & Plan:

## 2015-10-21 NOTE — Assessment & Plan Note (Signed)
I do not think she needs more steroids today as no wheezing on exam but needs CXR if no recovery with the cipro.

## 2015-10-21 NOTE — Assessment & Plan Note (Signed)
Last HgA1c 6.3 and will recheck and forward to her endocrinologist. It is unclear if maybe she is overtreated as several low sugars on recent CMP within the last year.

## 2015-10-21 NOTE — Telephone Encounter (Signed)
Thanks, noted

## 2015-10-21 NOTE — Assessment & Plan Note (Signed)
Checking TSH and adjust her synthroid 50 mcg daily as needed.

## 2015-10-22 DIAGNOSIS — M25562 Pain in left knee: Secondary | ICD-10-CM | POA: Diagnosis not present

## 2015-10-22 DIAGNOSIS — M25512 Pain in left shoulder: Secondary | ICD-10-CM | POA: Diagnosis not present

## 2015-10-22 DIAGNOSIS — M1712 Unilateral primary osteoarthritis, left knee: Secondary | ICD-10-CM | POA: Diagnosis not present

## 2015-10-22 DIAGNOSIS — M19012 Primary osteoarthritis, left shoulder: Secondary | ICD-10-CM | POA: Diagnosis not present

## 2015-10-23 ENCOUNTER — Other Ambulatory Visit: Payer: Self-pay | Admitting: Internal Medicine

## 2015-10-23 ENCOUNTER — Telehealth: Payer: Self-pay | Admitting: Pulmonary Disease

## 2015-10-23 MED ORDER — ALBUTEROL SULFATE HFA 108 (90 BASE) MCG/ACT IN AERS: 2.0000 | INHALATION_SPRAY | Freq: Four times a day (QID) | RESPIRATORY_TRACT | 2 refills | Status: DC | PRN

## 2015-10-23 MED ORDER — TIOTROPIUM BROMIDE MONOHYDRATE 18 MCG IN CAPS: 18.0000 ug | ORAL_CAPSULE | Freq: Every day | RESPIRATORY_TRACT | 5 refills | Status: DC

## 2015-10-23 NOTE — Telephone Encounter (Signed)
Spoke with the pt  She is requesting refills on proventil HFA and spiriva  Rxs were sent and nothing further needed

## 2015-10-29 ENCOUNTER — Telehealth: Payer: Self-pay | Admitting: Geriatric Medicine

## 2015-10-29 DIAGNOSIS — M25512 Pain in left shoulder: Secondary | ICD-10-CM | POA: Diagnosis not present

## 2015-10-29 DIAGNOSIS — M19012 Primary osteoarthritis, left shoulder: Secondary | ICD-10-CM | POA: Diagnosis not present

## 2015-10-29 NOTE — Telephone Encounter (Signed)
Patient called and said she is still having a very bad cough and was told to call back that she would probably need a chest x ray if not any better. She thinks she needs one. Please advise, thanks.

## 2015-10-30 NOTE — Telephone Encounter (Signed)
Called patient and advised her to call Dr. Lenna Gilford to get cxr ordered.

## 2015-10-30 NOTE — Telephone Encounter (Signed)
Dr. Jeannine Kitten office should order and she needs to call them but that would be my recommendation.

## 2015-10-31 ENCOUNTER — Telehealth: Payer: Self-pay | Admitting: Pulmonary Disease

## 2015-10-31 DIAGNOSIS — R05 Cough: Secondary | ICD-10-CM

## 2015-10-31 DIAGNOSIS — J4530 Mild persistent asthma, uncomplicated: Secondary | ICD-10-CM

## 2015-10-31 DIAGNOSIS — R059 Cough, unspecified: Secondary | ICD-10-CM

## 2015-10-31 NOTE — Telephone Encounter (Signed)
Per SN---  Is she wants a cxr she will need to go to the ER for eval.  We have no appt available today.  She just finished 2 abx and SN recs the following:  1.  Neb with albuterol TID--morning, midday and evening 2  advair bid and spiriva once daily 3. Use otc mucinex or generic guaffenison 2 po tid with plenty of fluids.   4. Flutter valve treatment TID 5. singulair 10 mg  1 po qhs  If fever, worsening SOB or breathing then she will need to go to the ER for full assessment.      Called and spoke with pt and she stated that she is doing all of these things that SN has recs for.  She stated that she did finish the abx last week and wanted to know how long she will need to wait before she can get the cxr done?  SN please advise. thanks

## 2015-10-31 NOTE — Telephone Encounter (Signed)
Patient states that she is coughing a lot, clear mucus.  Patient finished rounds of Doxycycline and Cipro last week. She said that she called her PCP and they recommended that she contact our office and get a CXR.  Dr. Lenna Gilford, please advise.   Current Outpatient Prescriptions on File Prior to Visit  Medication Sig Dispense Refill  . albuterol (PROVENTIL HFA;VENTOLIN HFA) 108 (90 Base) MCG/ACT inhaler Inhale 2 puffs into the lungs every 6 (six) hours as needed for wheezing or shortness of breath. 1 Inhaler 2  . albuterol (PROVENTIL) (2.5 MG/3ML) 0.083% nebulizer solution Take 3 mLs (2.5 mg total) by nebulization daily. 270 mL 3  . carvedilol (COREG) 6.25 MG tablet Take 3.125 mg by mouth 2 (two) times daily with a meal.     . ciprofloxacin (CIPRO) 500 MG tablet Take 1 tablet (500 mg total) by mouth 2 (two) times daily. 14 tablet 0  . denosumab (PROLIA) 60 MG/ML SOLN injection Inject 60 mg into the skin every 6 (six) months. Administer in upper arm, thigh, or abdomen    . Dexlansoprazole 30 MG capsule Take 1 capsule (30 mg total) by mouth daily. 90 capsule 2  . Dextromethorphan-Guaifenesin (MUCINEX DM MAXIMUM STRENGTH) 60-1200 MG TB12 Take 1 tablet by mouth 2 (two) times daily as needed (for cough/phlegm). Reported on 06/23/2015    . donepezil (ARICEPT) 5 MG tablet TAKE 1 TABLET (5 MG TOTAL) BY MOUTH AT BEDTIME. 30 tablet 3  . DULoxetine (CYMBALTA) 60 MG capsule Take 60 mg by mouth daily.    . fluticasone (FLONASE) 50 MCG/ACT nasal spray Place 1-2 sprays into both nostrils daily as needed for allergies or rhinitis. 16 g 6  . Fluticasone-Salmeterol (ADVAIR DISKUS) 500-50 MCG/DOSE AEPB Inhale 1 puff into the lungs 2 (two) times daily. 3 each 3  . glucose blood (FREESTYLE TEST STRIPS) test strip Use to test blood sugar 3 times daily. Dx: E11.65 100 each 3  . insulin aspart (NOVOLOG) 100 UNIT/ML injection Inject 4-6 Units into the skin daily before breakfast. Pens. (Patient taking differently: Inject 4 Units  into the skin daily before breakfast. Pens.) 15 mL 2  . Insulin Glargine (LANTUS SOLOSTAR) 100 UNIT/ML Solostar Pen Inject 30 Units into the skin every morning. (Patient taking differently: Inject 20 Units into the skin every morning. ) 15 mL 2  . Insulin Pen Needle 32G X 4 MM MISC Use 4x a day 360 each 1  . Lancets (FREESTYLE) lancets Use to test blood sugar 3 times daily. Dx: E11.65 100 each 3  . levothyroxine (SYNTHROID, LEVOTHROID) 50 MCG tablet Take 1 tablet (50 mcg total) by mouth daily before breakfast. 90 tablet 3  . LYRICA 300 MG capsule TAKE ONE CAPSULE BY MOUTH TWO TIMES DAILY 180 capsule 1  . montelukast (SINGULAIR) 10 MG tablet TAKE 1 TABLET (10 MG TOTAL) BY MOUTH DAILY. 90 tablet 3  . NON FORMULARY 1 Syringe by Epidural route every 3 (three) months. Epidural Steroid Injections for Lumbar Spinal Stenosis up to 4 times a year    . oxyCODONE-acetaminophen (PERCOCET/ROXICET) 5-325 MG tablet Take 1 tablet by mouth 2 (two) times daily as needed for severe pain. 45 tablet 0  . predniSONE (DELTASONE) 1 MG tablet Take 2 mg by mouth daily with breakfast.     . ranitidine (ZANTAC) 150 MG tablet Take 150 mg by mouth 2 (two) times daily as needed for heartburn.    . rosuvastatin (CRESTOR) 10 MG tablet Take 1 tablet (10 mg total) by mouth every  evening. 30 tablet 2  . tiotropium (SPIRIVA HANDIHALER) 18 MCG inhalation capsule Place 1 capsule (18 mcg total) into inhaler and inhale daily. 30 capsule 5  . zolpidem (AMBIEN) 5 MG tablet Take 1 tablet (5 mg total) by mouth at bedtime. 30 tablet 2   No current facility-administered medications on file prior to visit.    Allergies  Allergen Reactions  . Latex Rash  . Penicillins Rash

## 2015-11-03 DIAGNOSIS — F331 Major depressive disorder, recurrent, moderate: Secondary | ICD-10-CM | POA: Diagnosis not present

## 2015-11-03 NOTE — Telephone Encounter (Signed)
lmtcb x1 for pt. 

## 2015-11-03 NOTE — Telephone Encounter (Signed)
Per SN---  Ok for her to come in for cxr 2 view.  thanks

## 2015-11-04 ENCOUNTER — Ambulatory Visit (INDEPENDENT_AMBULATORY_CARE_PROVIDER_SITE_OTHER)
Admission: RE | Admit: 2015-11-04 | Discharge: 2015-11-04 | Disposition: A | Discharge status: Home / Self Care | Payer: Medicare Other | Source: Ambulatory Visit | Attending: Pulmonary Disease | Admitting: Pulmonary Disease

## 2015-11-04 DIAGNOSIS — J4530 Mild persistent asthma, uncomplicated: Secondary | ICD-10-CM

## 2015-11-04 DIAGNOSIS — R0602 Shortness of breath: Secondary | ICD-10-CM | POA: Diagnosis not present

## 2015-11-04 DIAGNOSIS — R059 Cough, unspecified: Secondary | ICD-10-CM

## 2015-11-04 DIAGNOSIS — R05 Cough: Secondary | ICD-10-CM

## 2015-11-04 NOTE — Telephone Encounter (Signed)
Called and spoke with pt and she is aware of order placed for her to come in for cxr.  Pt stated that she cannot come today but may make it by tomorrow.  She is aware of order in and can come when able. Nothing further is needed.

## 2015-11-05 ENCOUNTER — Telehealth: Payer: Self-pay | Admitting: Pulmonary Disease

## 2015-11-05 NOTE — Telephone Encounter (Signed)
Please notify patient> CXR was requested by her PCP DrCrawford...  CXR is unchanged from her old film 02/2015- no signs of pneumonia...  REC>> as we have prev discussed>> do 3 breathing treatments per day--  1) NEBS w/ Duoneb Tid  2) ADVAIR500- one inhalation twice daily  3) SPIRIVA- one inhalation daily  4) GUAIFENESIN 400mg  tabs- 2 tabs three times daily w/ fluids  5) Take the Prednisone each AM as directed by Rheum  6) take the SINGULAIR10mg  each eve  7) use the FLUTTER VALVE to help expectorate the sputum Tid after these breathing treatments...  ---  I spoke with patient about results and she verbalized understanding and had no questions

## 2015-11-06 DIAGNOSIS — M25512 Pain in left shoulder: Secondary | ICD-10-CM | POA: Diagnosis not present

## 2015-11-06 DIAGNOSIS — M19012 Primary osteoarthritis, left shoulder: Secondary | ICD-10-CM | POA: Diagnosis not present

## 2015-11-11 DIAGNOSIS — M25512 Pain in left shoulder: Secondary | ICD-10-CM | POA: Diagnosis not present

## 2015-11-11 DIAGNOSIS — M19012 Primary osteoarthritis, left shoulder: Secondary | ICD-10-CM | POA: Diagnosis not present

## 2015-11-18 ENCOUNTER — Other Ambulatory Visit: Payer: Self-pay

## 2015-11-18 ENCOUNTER — Telehealth: Payer: Self-pay | Admitting: Internal Medicine

## 2015-11-18 DIAGNOSIS — F331 Major depressive disorder, recurrent, moderate: Secondary | ICD-10-CM | POA: Diagnosis not present

## 2015-11-18 MED ORDER — GLUCOSE BLOOD VI STRP: ORAL_STRIP | 3 refills | Status: DC

## 2015-11-18 NOTE — Telephone Encounter (Signed)
glucose blood (FREESTYLE TEST STRIPS) test strip and pens [facility] - [place], [place] - [address] [phone] (Phone) [phone] (Fax)

## 2015-11-18 NOTE — Telephone Encounter (Signed)
OK to call in

## 2015-12-04 DIAGNOSIS — F331 Major depressive disorder, recurrent, moderate: Secondary | ICD-10-CM | POA: Diagnosis not present

## 2015-12-07 ENCOUNTER — Other Ambulatory Visit: Payer: Self-pay | Admitting: Pulmonary Disease

## 2015-12-15 DIAGNOSIS — M2550 Pain in unspecified joint: Secondary | ICD-10-CM | POA: Diagnosis not present

## 2015-12-15 DIAGNOSIS — M154 Erosive (osteo)arthritis: Secondary | ICD-10-CM | POA: Diagnosis not present

## 2015-12-15 DIAGNOSIS — M797 Fibromyalgia: Secondary | ICD-10-CM | POA: Diagnosis not present

## 2015-12-15 DIAGNOSIS — M150 Primary generalized (osteo)arthritis: Secondary | ICD-10-CM | POA: Diagnosis not present

## 2015-12-15 DIAGNOSIS — M353 Polymyalgia rheumatica: Secondary | ICD-10-CM | POA: Diagnosis not present

## 2015-12-18 ENCOUNTER — Other Ambulatory Visit (INDEPENDENT_AMBULATORY_CARE_PROVIDER_SITE_OTHER): Payer: Medicare Other

## 2015-12-18 ENCOUNTER — Encounter: Payer: Self-pay | Admitting: Internal Medicine

## 2015-12-18 ENCOUNTER — Telehealth: Payer: Self-pay | Admitting: Internal Medicine

## 2015-12-18 ENCOUNTER — Ambulatory Visit (INDEPENDENT_AMBULATORY_CARE_PROVIDER_SITE_OTHER): Payer: Medicare Other | Admitting: Internal Medicine

## 2015-12-18 VITALS — BP 130/78 | HR 72 | Temp 98.1°F | Resp 18 | Ht 60.0 in | Wt 163.0 lb

## 2015-12-18 DIAGNOSIS — Z23 Encounter for immunization: Secondary | ICD-10-CM

## 2015-12-18 DIAGNOSIS — M353 Polymyalgia rheumatica: Secondary | ICD-10-CM | POA: Diagnosis not present

## 2015-12-18 DIAGNOSIS — D649 Anemia, unspecified: Secondary | ICD-10-CM | POA: Diagnosis not present

## 2015-12-18 DIAGNOSIS — M797 Fibromyalgia: Secondary | ICD-10-CM | POA: Diagnosis not present

## 2015-12-18 LAB — CBC
HCT: 32.5 % — ABNORMAL LOW (ref 36.0–46.0)
Hemoglobin: 10.3 g/dL — ABNORMAL LOW (ref 12.0–15.0)
MCHC: 31.8 g/dL (ref 30.0–36.0)
MCV: 84.2 fl (ref 78.0–100.0)
Platelets: 390 10*3/uL (ref 150.0–400.0)
RBC: 3.86 Mil/uL — ABNORMAL LOW (ref 3.87–5.11)
RDW: 17.5 % — ABNORMAL HIGH (ref 11.5–15.5)
WBC: 15.7 10*3/uL — ABNORMAL HIGH (ref 4.0–10.5)

## 2015-12-18 MED ORDER — OXYCODONE-ACETAMINOPHEN 5-325 MG PO TABS: 1.0000 | ORAL_TABLET | Freq: Two times a day (BID) | ORAL | 0 refills | Status: DC | PRN

## 2015-12-18 MED ORDER — ZOLPIDEM TARTRATE 5 MG PO TABS: 5.0000 mg | ORAL_TABLET | Freq: Every day | ORAL | 2 refills | Status: DC

## 2015-12-18 NOTE — Assessment & Plan Note (Signed)
Needs UDS today, per review of [place] narcotic database she is getting alprazolam from MD in [facility] as well as hydrocodone in June from another doctor in [place]. Refill of oxycodone given today.

## 2015-12-18 NOTE — Assessment & Plan Note (Signed)
From her report it sounds like she is having a flare and she was asked to increase prednisone dosing. She is encouraged to do that and try again and call her rheumatologist if she is not able to follow their plan.

## 2015-12-18 NOTE — Telephone Encounter (Signed)
Inform pt of the test result. Pt was wondering what else can she do to get better. Please call her back

## 2015-12-18 NOTE — Progress Notes (Signed)
Pre visit review using our clinic review tool, if applicable. No additional management support is needed unless otherwise documented below in the visit note. 

## 2015-12-18 NOTE — Patient Instructions (Signed)
We would like you to take 10 mg of prednisone daily for 3 days then 5 mg daily for 3 days then go back to taking your 2 mg prednisone daily.

## 2015-12-18 NOTE — Progress Notes (Signed)
   Subjective:    Patient ID: Kayla Thompson, female    DOB: 10-16-1935, 80 y.o.   MRN: [id number]  HPI The patient is a 80 YO female coming in for diffuse body pain. Her rheumatologist gave her a prescription for prednisone but she was not able to take that. She tried and got insomnia. She was asked to take 15 mg for 3 days then 10 mg for 3 days then 5 mg for 3 days then return to her 2 mg dosing. She took 15 mg one day then 5 mg the next and now is here today has not taken any. It was helping with her pain on the 15 mg. The insomnia was bad. She does have history of PMR and was previously maintained on 2 mg prednisone daily. She also has severe OA. Pain is mostly in her knees and left shoulder (has severe OA in those joints which is known). No injury to that area or increased activity to explain the worsening pain.   Review of Systems  Constitutional: Positive for activity change. Negative for appetite change, fatigue, fever and unexpected weight change.  Respiratory: Negative for cough, chest tightness, shortness of breath and wheezing.   Cardiovascular: Negative for chest pain, palpitations and leg swelling.  Gastrointestinal: Negative for abdominal distention, abdominal pain, constipation and diarrhea.  Musculoskeletal: Positive for arthralgias, back pain and myalgias.  Neurological: Negative.   Psychiatric/Behavioral: Negative for decreased concentration, dysphoric mood, self-injury, sleep disturbance and suicidal ideas. The patient is not nervous/anxious.       Objective:   Physical Exam  Constitutional: She is oriented to person, place, and time. She appears well-developed and well-nourished.  Overweight  HENT:  Head: Normocephalic and atraumatic.  Right Ear: External ear normal.  Left Ear: External ear normal.  Eyes: EOM are normal.  Neck: Normal range of motion.  Cardiovascular: Normal rate and regular rhythm.   Pulmonary/Chest: Effort normal. No respiratory distress. She has no  wheezes. She has no rales. She exhibits no tenderness.  Abdominal: Soft. Bowel sounds are normal. She exhibits no distension. There is no tenderness. There is no rebound.  Musculoskeletal:  Pain in the left shoulder around the muscles, no swelling or tenderness in the [facility] joint itself.  Neurological: She is alert and oriented to person, place, and time. Coordination normal.  Skin: Skin is warm and dry.   Vitals:   12/18/15 0942 12/18/15 1006  BP: (!) 154/86 130/78  Pulse: 72   Resp: 18   Temp: 98.1 F (36.7 C)   TempSrc: Oral   SpO2: 94%   Weight: 163 lb (73.9 kg)   Height: 5' (1.524 m)       Assessment & Plan:  Flu and pneumonia shot today.

## 2015-12-19 ENCOUNTER — Other Ambulatory Visit: Payer: Self-pay | Admitting: *Deleted

## 2015-12-19 MED ORDER — DONEPEZIL HCL 5 MG PO TABS: ORAL_TABLET | ORAL | 3 refills | Status: DC

## 2015-12-19 NOTE — Telephone Encounter (Signed)
Rec'd call pt is requesting refill on Donepezil inform sent to [facility].../lmb

## 2015-12-19 NOTE — Telephone Encounter (Signed)
Patient wanted to know why she is taking extra prednisone. Per, Dr. Sharlet Salina patient is to follow rheumatologists plan of care and if she is having a problems call the rheumatologist. Patient aware.

## 2015-12-22 ENCOUNTER — Telehealth: Payer: Self-pay

## 2015-12-22 DIAGNOSIS — M1712 Unilateral primary osteoarthritis, left knee: Secondary | ICD-10-CM | POA: Diagnosis not present

## 2015-12-22 DIAGNOSIS — M25512 Pain in left shoulder: Secondary | ICD-10-CM | POA: Diagnosis not present

## 2015-12-22 NOTE — Telephone Encounter (Signed)
Cation ordered.

## 2015-12-23 ENCOUNTER — Ambulatory Visit: Payer: Medicare Other | Admitting: Pulmonary Disease

## 2015-12-23 DIAGNOSIS — F331 Major depressive disorder, recurrent, moderate: Secondary | ICD-10-CM | POA: Diagnosis not present

## 2015-12-24 ENCOUNTER — Other Ambulatory Visit: Payer: Self-pay

## 2015-12-24 ENCOUNTER — Ambulatory Visit: Payer: Medicare Other | Admitting: Pulmonary Disease

## 2015-12-24 MED ORDER — INSULIN ASPART 100 UNIT/ML FLEXPEN: PEN_INJECTOR | SUBCUTANEOUS | 2 refills | Status: DC

## 2015-12-25 ENCOUNTER — Encounter: Payer: Self-pay | Admitting: Pulmonary Disease

## 2015-12-25 ENCOUNTER — Telehealth: Payer: Self-pay | Admitting: Geriatric Medicine

## 2015-12-25 ENCOUNTER — Ambulatory Visit: Payer: Medicare Other | Admitting: Pulmonary Disease

## 2015-12-25 ENCOUNTER — Ambulatory Visit (INDEPENDENT_AMBULATORY_CARE_PROVIDER_SITE_OTHER): Payer: Medicare Other | Admitting: Pulmonary Disease

## 2015-12-25 VITALS — BP 128/78 | HR 77 | Temp 97.6°F | Ht 60.0 in | Wt 162.0 lb

## 2015-12-25 DIAGNOSIS — R05 Cough: Secondary | ICD-10-CM

## 2015-12-25 DIAGNOSIS — I5032 Chronic diastolic (congestive) heart failure: Secondary | ICD-10-CM | POA: Diagnosis not present

## 2015-12-25 DIAGNOSIS — M353 Polymyalgia rheumatica: Secondary | ICD-10-CM

## 2015-12-25 DIAGNOSIS — M159 Polyosteoarthritis, unspecified: Secondary | ICD-10-CM

## 2015-12-25 DIAGNOSIS — F411 Generalized anxiety disorder: Secondary | ICD-10-CM

## 2015-12-25 DIAGNOSIS — E1121 Type 2 diabetes mellitus with diabetic nephropathy: Secondary | ICD-10-CM

## 2015-12-25 DIAGNOSIS — E039 Hypothyroidism, unspecified: Secondary | ICD-10-CM

## 2015-12-25 DIAGNOSIS — M8949 Other hypertrophic osteoarthropathy, multiple sites: Secondary | ICD-10-CM

## 2015-12-25 DIAGNOSIS — R059 Cough, unspecified: Secondary | ICD-10-CM

## 2015-12-25 DIAGNOSIS — J471 Bronchiectasis with (acute) exacerbation: Secondary | ICD-10-CM | POA: Diagnosis not present

## 2015-12-25 DIAGNOSIS — K219 Gastro-esophageal reflux disease without esophagitis: Secondary | ICD-10-CM

## 2015-12-25 DIAGNOSIS — Z794 Long term (current) use of insulin: Secondary | ICD-10-CM

## 2015-12-25 DIAGNOSIS — I10 Essential (primary) hypertension: Secondary | ICD-10-CM | POA: Diagnosis not present

## 2015-12-25 DIAGNOSIS — M150 Primary generalized (osteo)arthritis: Secondary | ICD-10-CM

## 2015-12-25 MED ORDER — HYDROCODONE-HOMATROPINE 5-1.5 MG/5ML PO SYRP: 5.0000 mL | ORAL_SOLUTION | Freq: Four times a day (QID) | ORAL | 0 refills | Status: DC | PRN

## 2015-12-25 MED ORDER — ALBUTEROL SULFATE HFA 108 (90 BASE) MCG/ACT IN AERS: 2.0000 | INHALATION_SPRAY | Freq: Four times a day (QID) | RESPIRATORY_TRACT | 11 refills | Status: DC | PRN

## 2015-12-25 MED ORDER — METHYLPREDNISOLONE 4 MG PO TBPK: ORAL_TABLET | ORAL | 0 refills | Status: DC

## 2015-12-25 NOTE — Patient Instructions (Signed)
Today we updated your med list in our EPIC system...    Continue your current medications the same...  REMEMBER to use your ADVAIR twice daily & your [facility] once daily...  Be sure to take a breathing treatment on your NEBULIZER three times daily before using the Advair & Spiriva...  We wrote of a 6d MEDROL Pak for the inflammation...  We wrote for a cough syrup> HYCODAN one tsp every [place] as needed...   Finally we need to address the other mechanism causing cough & chest congestion+> your reflux!    Take the DEXLANSOPRAZOLE 30mg  in the Am before breakfast...    Take the ZANTAC 150mg  in the evening...    Do no eat or drink after dinner in the eve...    Keep the head of your be elevated by ~30 degrees...  We will arrange for a G*I consultation regarding this refracttory problem...  Call for any questions...  Let's plan a follow up visit in 3-35mo, sooner if needed for problems.Marland KitchenMarland Kitchen

## 2015-12-25 NOTE — Telephone Encounter (Signed)
Spoke with patient and she left a urine sample for a UDS. There was not enough urine to run test. Patient must come in, per Dr. Sharlet Salina within the next 24 hours to leave another sample for UDS. Patient aware and will be in by tomorrow afternoon.

## 2015-12-25 NOTE — Progress Notes (Signed)
Subjective:     Patient ID: Kayla Thompson, female   DOB: Jul 01, 1935, 80 y.o.   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  ~  January 07, 2015:  Initial pulmonary consult w/ SN>      80 y/o WF, referred by [name]) for a pulmonary evaluation due to cough & dyspnea>  Kayla Thompson moved to [place] about 27mo ago to be near her daughter, granddaughter, and great-grands after her husb passed away;  Shortly after arrival she fell & was [facility] w/ fx nasal bones and left wrist (distal radius & ulna) managed by Burnett Sheng summary reviewed... She established Primary Care w/ DrKollar, [place], Endocrine- DrGherghe... She has a Hx of pulmonary problems identified by the Pt & records from [facility] as Bronchiectasis and Asthma;  Her CC is SOB/ DOE w/ min activity & it is apparent that she is rather sedentary and a poor historian; she notes mild cough, some yellow sputum, no hemoptysis; she denies CP, palpit, or signif cardiac history...  Smoking Hx>  She is a never smoker, but had signif 2nd hand exposure from her husb...  Pulmonary Hx>  She indicates hx left lung surg 2000 for benign granuloma; hx bronchiectasis but she does not know how the Dx was established; told asthma as well ever since early 2000's but not as a child or young adult; states she had pneumonia age 52 at same time as the measles (not hosp & made full recovery w/o apparent residual resp problems); she does admit to recurrent bronchitic infections as adult requiring antibiotics & occas Pred rx... She adds a rambling hx of work-ups at [facility] (in [place] in [place] but she does not know what this was for?       Notes from Dr. Mick Sell in [place]> Bronchiectasis, mult pulm nodules, & mild Asthma; prev pulm nodule resected in 2000 which proved to be a granuloma; he reported that an atypical  mycobacterium was found at the time of lung bx in the past- no further details provided; seen for cough w/ yellow sput & incr dyspnea- reported to be stable on Advair500Bid, Spiriva daily, Singulair10, NEB w/ Albut-Bid, Mucinex, VentolinHFA rescue prn; she was also on Pred 3mg /d for PMR from her Rheum;   Medical Hx>  Diastolic Dysfunction on 2DEcho, mild AS, HL, IDDM, Hypothyroid, GERD, DJD w/ TKR and ankle surg; Fibromyalgia on Lyrica/ Percocet/ Tramadol; PMR per Rheum in [place] on Pred2mg /d x 34yr (it was last dropped from 3mg  ~12yr ago); Osteoporosis; Anemia; Anxiety.   Family Hx>  Mother died from lung cancer (as did one of her sisters);  Father passed from heart disease.  Occup Hx>  No known occupational exposures  Current Meds>  SEE FULL MED LIST BELOW EXAM showed Afeb, VSS, O2sat=99% on RA;  Heent- neg, mallampati3;  Chest- decr BS bilat, not congested, w/o w/r/r heard;  Heart- RR gr1/6SEM w/o r/g detected;  Abd- soft, nontender, +panniculus;  Ext- VI w/o c/c/e...   CT Chest 11/17/09 in [place]> mult noncalcif pulm nodules bilat (stable when compared to older scans), mucoid impaction LUL & RML, pleuroparenchymal scarring right base (unchanged), no adenopathy, norm heart size & coronary calcif.  PFTs 02/04/10 in [place]> FVC=2.20 (95%), FEV1=1.42 (88%), %1sec=65, mid-flows reduced at 38% predicted; no improvement in FEV1 after bronchodil; TLC=4.70 (118%), RV=2.48 (153%), RV/TLC=53%; DLCO=54% predicted.  CXR 01/14/14 reported to show norm heart  size, clear lungs, thoracic spondylosis, NAD...   CXR 08/02/14 in ER here after fall showed norm heart size, diffuse interstitial coarsening w/o focal airsp dis...   2DEcho 08/02/14 in [place] after fall showed norm LVF w/ EF=55-60%, no regional wall motion abn, Gr1 DD, mild AS w/ mean gradient 48mmHg; mod calcif MV leaflets w/o stenosis or regurg; RV looked OK but could not est PAsys...  CXR here 01/07/15 showed norm heart size, incr AP diameter of chest & flat  diaphragms, incr markings diffusely, old right rib fxs, osteopenia...   Spirometry 01/07/15> we attempted mult trial of simple spirometry but pt could not generate an acceptable curve & results are not reliable...  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 We will follow closely.    Cardiac> on Coreg6.25Bid for ?HBP; she has mild AS & DiastCHF on 2DEcho and this needs follow up...    Endocrine> IDDM, Hypothyroid, transiently elev serum calcium ?etiology (resolved spont); managed by DrGherghe; BS today is 45 & asked to eat & check w/ endocrine re- adjustment in Novolog...    GYN> Hx ovarian cancer (in remission- details unknown), s/p hyst    Ortho/ Rheum> DJD w/ TKR, FM on Lyrica etc, severe LBP requires epidural shots; PMR on Pred (details unknown); her Sed=52 and she will prob need Rheum to help sort this out...    Heme> Hg 10-11 range May2016 after fall w/ fx nose & wrist; similar now & may need further anemia work-up as well...  ADDENDUM>>  Hi-res CT Chest 01/14/15:  Norm heart size, atherosclerosis of Ao/ great vessels/ coronaries, no adenopathy, atrophic thyroid; ?small sliding pulm hernia betw the lat 7th & 8th ribs w/ scarring; extensive bronchiectasis bilat w/ mucoid impactions within dilated airways (infectious/inflamm bronchiolitis- r/o MAI), 1mm LLL  nodule, tracheobronchomalacia w/ extensive bilat air trapping on expiration, smallHH, old healed right 6th-7th rib fx, DJD Tspine...  REC> continue NEBS, Advair, Spiriva, Mucinex (maximize to 600mg  Qid w/ fluids), practice purse lip breathing to splint the airways open & aide in expectoration of phlegm, use FLUTTER valve & postural drainage to get the sput out; collect sput for routine C&S, AFB, Fungi => TC w/ pt: she has a lot going on, refuses further meds, refuses further eval, can't be bothered w/ learning Chest PT/ Flutter, postural drainage, purse lip breathing, collecting sputum specimen, etc; states she's had all this a long time & not interested in more aggressive pulmonary treatments at this time... She will f/u w/ me in November & we will discuss again.  ADDENDUM>>  Records from Dr. Mick Sell, Pulmonary in [place]=> note of 07/18/13:  Hx mild asthma & bronchiectasis, hx mult pulm nodules followed for several yrs & one removed surgically in 2000 that proved to be a granuloma & report of an atypical mycobacterium being found at the same time (but no details given), on NEB w/ Albuterol,  Advair, Spiriva, Singulair, Mucinex, low-dose Pred for PMR;  She was given all indicated vaccinations;  No change in her med regimen for yrs...   ~  February 19, 2015:  6wk ROV w/ SN>  SEE ABOVE-- pt notes nasal congestion, chest congestion, sl cough & incr SOB for the last wk or so; in the next breath she says "I seldom get a cold", thinks it's all allergies, notes great-grnads recently ill;  She has NEBS w/ Albut (only using once per day), Advair500Bid, Spiriva once daily, Mucinex (?how much), Pred?1mg - 3/d, Singulair10/ Zyrtek10...  Since she was here last she has followed up w/ PCP-DrCrawford 10/25> FM doing ok on Lyrica & she reported more functional & resting better...   She also had a f/u w/ Endocrine-DrGherghe 11/8> DM, osteoporosis, hypothyroid, & Hx hyperpara; Labs reviewed & ok (A1c=6.5 on her insulin,  Ca is wnl and vitD=66)...    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     Cardiac> on Coreg6.25Bid for ?HBP; she has mild AS & DiastCHF on 2DEcho and this needs follow up w/ her PCP vs Cards...    Endocrine> IDDM, Hypothyroid, transiently elev serum calcium ?etiology (resolved spont); managed by DrGherghe...    GYN> Hx ovarian cancer (in remission- details unknown), s/p hyst & Ca125=9 (08/2014)...    Ortho/ Rheum> DJD w/ TKR, FM on Lyrica, Tramadol, Percocet,etc, severe LBP requires epidural shots; PMR on Pred (details unknown); her Sed=52 and she will prob need Rheum to help sort this out...    Heme> Hg 10-11 range May2016 after fall w/ fx nose & wrist; similar now & may need further anemia work-up as well... EXAM showed Afeb, VSS, O2sat=93% on RA;  Wt=162#;  Heent- neg, mallampati3;  Chest- decr BS bilat, not congested, w/o w/r/r heard;  Heart- RR gr1/6SEM w/o r/g detected;  Abd- soft, nontender, +panniculus;  Ext- VI w/o c/c/e...   Pt given a neb treatment in office- unable to produce any sput for cultures... IMP/PLAN>>  We discussed Rx w/ ZPak for her URI (it could also help her if she has atyp-mycobacterium); we compromised rx w/ NEBS TID- followed by AdvairBid & Spiriva once daily, plus the MUCINEX1200mg Bid w/ fluids;  She again declines the added benefit from a vigorous chest PT regimen, flutter valve, postural drainage, purse lip breathing, etc... we plan a recheck in 3 months...  ~  June 23, 2015:  75mo ROV  w/ SN>  Kayla Thompson returns unaccompanied and states that her breathing is good- no problem she says; notes min cough, sm amt whitish sput (no color, no blood), admits to mild SOB/DOE but swears there is no progression, and denies CP/ f/c/s/ etc... She states that she was INTOL to ZPak given last OV but can't recall the reaction ("?didn't work for me"), doesn't want it again;  We reviewed her meds & again she is not being aggressive enough to effectively treat her bronchiectasis & mucoid impactions> eg. Only using the Albut NEBS prn & ave once Qod, only using the Mucinex 1200 once daily, she says she is using the ADVAIR500Bid, SPIRIVA once daily, SINGULAIR10, PRED1mg -2tabs  Qam ?per PCP/ ?Rheum... 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    Cardiac> on Coreg6.25- 1/2Bid for ?HBP; she has mild AS & DiastCHF on 2DEcho and this needs follow up w/ her PCP vs Cards...    Endocrine> IDDM on Lantus & Novolog (A1c=6.3), HL on Crestor10 (FLP- controlled), Hypothyroid on Synthroid50, transiently elev serum calcium ?etiology (resolved spont), osteoporosis w/ mult fxs on Prolia Q55mo; managed by DrGherghe & last seen 06/09/15- note reviewed...    GYN> Hx ovarian cancer (in remission- details unknown), s/p hyst & Ca125=9 (08/2014)...    Ortho/ Rheum> DJD w/ TKR, FM on Lyrica,  Tramadol, Percocet,etc, severe LBP requires epidural shots; she is on a huge dose of Lyrica; PMR on Pred (details unknown); her Sed=52 and she will prob need Rheum to help sort this out...    Neuro> on Aricept5 per DrCrawford, I wonder if she is taking any/ all of her meds properly, no care giver accompanies pt to office visits, she is ?stoic ?oblivious...    Heme> Hg 10-11 range May2016 after fall w/ fx nose & wrist; similar now & may need further anemia work-up as well, deferred to PCP... EXAM showed Afeb, VSS, O2sat=98% on RA;  Wt=161#;  Heent- neg, mallampati3;  Chest- decr BS bilat, not congested, w/o w/r/r heard;  Heart- RR gr1/6SEM w/o r/g detected;  Abd- soft, nontender, +panniculus;  Ext- VI w/o c/c/e...   LABS 2016:  Reviewed... IMP/PLAN>>  We again outlined for Kayla Thompson a vigorous home treatment regimen for her bronchiectasis & mucoid impactions including a TID regimen>  NEB w/ Albut vs Duoneb Tid, followed by her Advair500Bid & Spiriva once/d, Guaifenesin 400mg  tabs- 2Tid w/ fluids, take her low dose Pred in AM & Singulair in PM; she declines to do chest PT/ Flutter & postural drainage treatments...  ~  October 01, 2015:  42mo ROV & add-on appt requested by pt for COPD exac>  She reports that she went to a granddaughter's wedding in [place] 6/24, then spent 1wk in [place] and "caught a cold", didn't go to UC or see a physician, just "toughed it out", getting better slowly but symptoms lingering she says> head is woozy, nasal drainage, sl cough, white sput (no discoloration or blood), sl tight/ wheezy/ congested but no CP/ f/c/s, etc... She reminds me that "ZPak doesn't work for me", and she wants Doxy;  It is apparent that she is still NOT doing her Resp treatment regimen as I have prev requested & outlined for her (see below)- acts as if this is the first time she has heard this from me, she remains on Aricept5 from PCP & has not seen Neuro...we reviewed the following medical problems during today's office  visit >>     Pulmonary> Hx COPD/ Bronchiectasis/ Abn CT Chest w/ mucoid impactions, tracheobronchomalacia w/ air trapping, 10mm LLL nodule, Hx granulomatous lung dis & reported atyp mycobacterium in  the past>  See Hi-res CTChest 12/2014;  We encouraged a regular bronchodilator regimen (NEBS w/ Albut vs Duoneb Tid, ADVAIR500Bid, SPIRIVA daily), Guaifenesin 400mg -2Tid, Singulair10, Fluids, Flutter/ postural drainage/ purse-lip breathing exercises, & check sput for cultures but she could not be bothered & refused further pulm treatment/ testing sput/ etc=> still not cooperating w/ max chest physiotherapy regimen & states she only gets a sm amt of whitish sput (unable to successfully induce sput in office...    Cardiac> on Coreg6.25- 1/2Bid for ?HBP; she has mild AS & DiastCHF on 2DEcho and this needs follow up w/ her PCP vs Cards...    Endocrine> IDDM on Lantus & Novolog (A1c=6.3), HL on Crestor10 (FLP- controlled), Hypothyroid on Synthroid50, transiently elev serum calcium ?etiology (resolved spont), osteoporosis w/ mult fxs on Prolia Q3mo; managed by DrGherghe & last seen 06/09/15- note reviewed...    GYN> Hx ovarian cancer (in remission- details unknown), s/p hyst & Ca125=9 (08/2014)... She is followed by DrFernandez now...    Ortho/ Rheum> DJD w/ TKR, FM on Lyrica, Tramadol, Percocet,etc, severe LBP requires epidural shots; she is on a huge dose of Lyrica; PMR on Pred (details unknown); her Sed=52 and she saw [facility] for Rheum last 09/03/15- severe OA, FM, PMR, & osteoporosis on Lyrica, Pred, Percocet, & Prolia; she felt worse when out of her Lyrica    Neuro> on Aricept5 per DrCrawford, I wonder if she is taking any/ all of her meds properly, no care giver accompanies pt to office visits, she is ?stoic ?oblivious...    Heme> Hg 10-11 range May2016 after fall w/ fx nose & wrist; similar now & may need further anemia work-up as well, deferred to PCP... EXAM showed Afeb, VSS, O2sat=96% on RA;  Wt=167#;  Heent-  neg, mallampati3;  Chest- decr BS bilat, not congested, w/o w/r/r heard;  Heart- RR gr1/6SEM w/o r/g detected;  Abd- soft, nontender, +panniculus;  Ext- VI w/o c/c/e...  IMP/PLAN>>  Kayla Thompson has a URI & mild COPD exac=> we will treat w/ Doxy100Bid (her request) and Medrol dosepak;  We again reviewed the recommended treatment protocol for her bronchiectasis (it was like she heard it for the 1st time)>  NEBS Tid, followed by [facility] & SPIRIVA daily, +Guaifenesin400-2Tid + Fluids, take the low dose Pred in AM (per Rheum) and the Singulair10 in PM, work to expectorate the phlegm but she declines to do chest PT, Flutter, postural drainage, etc... we plan ROV recheck in 59mo...  ~  December 25, 2015:  53mo ROV & Kayla Thompson is c/o same chr cough, intermit wheezing & notes that the cough is worse when she talks or eats; as above she is followed for COPD/bronchiectasis and an abn CTChest w/ mucoid impactions/ tracheobronchomalacia w/ air trapping, 65mm LLL nodule, and hx granulomatous lung dis w/ reported atyp mycobac in the past; she is 80 y/o & has a senile dementia on Aricept & I have suspected that she does NOT take her meds properly- when quizzed in the office she is unsure of her diagnoses & medications...     Pulmonary> Hx COPD/ Bronchiectasis/ Abn CT Chest w/ mucoid impactions, tracheobronchomalacia w/ air trapping, 60mm LLL nodule, Hx granulomatous lung dis & reported atyp mycobacterium in the past>  See Hi-res CTChest 12/2014;  We encouraged a regular bronchodilator regimen (NEBS w/ Albut vs Duoneb Tid, ADVAIR500Bid, SPIRIVA daily), Guaifenesin 400mg -2Tid, Singulair10, Fluids, Flutter/ postural drainage/ purse-lip breathing exercises, & check sput for cultures but she could not be bothered & refused further pulm treatment/ testing sput/  etc=> still not cooperating w/ max chest physiotherapy regimen & states she only gets a sm amt of whitish sput (unable to successfully induce sput in office...    GI> prev eval  apparently in [place] pt doesn't recall, no records avail, she has Prevacid30 & Zantac150Bid listed on med list but she says just prn... EXAM showed Afeb, VSS, O2sat=97% on RA;  Wt=162#;  Heent- neg, mallampati3;  Chest- decr BS bilat, not congested, w/o w/r/r heard;  Heart- RR gr1/6SEM w/o r/g detected;  Abd- soft, nontender, +panniculus;  Ext- VI w/o c/c/e...  IMP/PLAN>>  Kayla Thompson has serious multisys dis & is managed by DrCrawford-PCP;  From the pulm standpoint she continues to downplay the severity of her lung dis & still refuses/ forgets/ etc to do her breathing treatments as we have outlined for her- REC to use NEB Tid, followed by Advair500Bid, Spiriva once dailyGFN400-2Tid w/ fluids, Singulair10, & Flutter valve/ postural drain/ purse-lip breathig treatments TID... Today we reviewed this regimen & added a Medrol Dosepak... She now appears to have signif GI-GERD/ reflux/ prob LPR & we added a vigorous antireflux regimen=> Prevacid30 taken 30 miin before dinner, NPO after dinner, elev HOB 6" blocks... She needs to see GI for further eval & needs these recommendations reinforced by them & her PCP...    Past Medical History:  Diagnosis Date  . Arthritis   . Asthma   . Diabetes mellitus without complication ([place])   . Fibromyalgia   . GERD (gastroesophageal reflux disease)   . History of chemotherapy   . History of fractured pelvis   . Hypertension   . Osteoporosis   . Osteoporosis   . Ovarian cancer [facility])     Past Surgical History  Procedure Laterality Date  . Hip surgey    . Knee surgey    . Lung surgery >> resection of nodule 2000, proved to be a benign granuloma & ?atyp mycobacterium ident at the same time?    . Bladder suspension    . Ankle fracture surgery      plate and screws  . Abdominal hysterectomy  1996    Outpatient Encounter Prescriptions as of 12/25/2015  Medication Sig  . albuterol (PROVENTIL HFA;VENTOLIN HFA) 108 (90 Base) MCG/ACT inhaler Inhale 2 puffs into  the lungs every 6 (six) hours as needed for wheezing or shortness of breath.  Marland Kitchen albuterol (PROVENTIL) (2.5 MG/3ML) 0.083% nebulizer solution Take 3 mLs (2.5 mg total) by nebulization daily.  . carvedilol (COREG) 6.25 MG tablet Take 3.125 mg by mouth 2 (two) times daily with a meal.   . denosumab (PROLIA) 60 MG/ML SOLN injection Inject 60 mg into the skin every 6 (six) months. Administer in upper arm, thigh, or abdomen  . Dexlansoprazole 30 MG capsule Take 1 capsule (30 mg total) by mouth daily.  Marland Kitchen Dextromethorphan-Guaifenesin (MUCINEX DM MAXIMUM STRENGTH) 60-1200 MG TB12 Take 1 tablet by mouth 2 (two) times daily as needed (for cough/phlegm). Reported on 06/23/2015  . donepezil (ARICEPT) 5 MG tablet TAKE 1 TABLET (5 MG TOTAL) BY MOUTH AT BEDTIME.  . DULoxetine (CYMBALTA) 60 MG capsule Take 60 mg by mouth daily.  . fluticasone (FLONASE) 50 MCG/ACT nasal spray Place 1-2 sprays into both nostrils daily as needed for allergies or rhinitis.  . Fluticasone-Salmeterol (ADVAIR DISKUS) 500-50 MCG/DOSE AEPB Inhale 1 puff into the lungs 2 (two) times daily.  Marland Kitchen glucose blood (FREESTYLE TEST STRIPS) test strip Use to test blood sugar 3 times daily. Dx: E11.65  . HYDROcodone-homatropine (  HYCODAN) 5-1.5 MG/5ML syrup Take 5 mLs by mouth every 6 (six) hours as needed for cough.  . insulin aspart (NOVOLOG FLEXPEN) 100 UNIT/ML FlexPen Inject 4-6 units into the skin daily  . Insulin Glargine (LANTUS SOLOSTAR) 100 UNIT/ML Solostar Pen Inject 30 Units into the skin every morning. (Patient taking differently: Inject 20 Units into the skin every morning. )  . Insulin Pen Needle 32G X 4 MM MISC Use 4x a day  . Lancets (FREESTYLE) lancets Use to test blood sugar 3 times daily. Dx: E11.65  . levothyroxine (SYNTHROID, LEVOTHROID) 50 MCG tablet Take 1 tablet (50 mcg total) by mouth daily before breakfast.  . LYRICA 300 MG capsule TAKE ONE CAPSULE BY MOUTH TWO TIMES DAILY  . methylPREDNISolone (MEDROL) 4 MG TBPK tablet Take as  directed  . montelukast (SINGULAIR) 10 MG tablet TAKE 1 TABLET (10 MG TOTAL) BY MOUTH DAILY.  . montelukast (SINGULAIR) 10 MG tablet TAKE 1 TABLET (10 MG TOTAL) BY MOUTH DAILY. (Patient not taking: Reported on 12/18/2015)  . NON FORMULARY 1 Syringe by Epidural route every 3 (three) months. Epidural Steroid Injections for Lumbar Spinal Stenosis up to 4 times a year  . oxyCODONE-acetaminophen (PERCOCET/ROXICET) 5-325 MG tablet Take 1 tablet by mouth 2 (two) times daily as needed for severe pain.  . predniSONE (DELTASONE) 1 MG tablet Take 2 mg by mouth daily with breakfast.   . ranitidine (ZANTAC) 150 MG tablet Take 150 mg by mouth 2 (two) times daily as needed for heartburn.  . rosuvastatin (CRESTOR) 10 MG tablet Take 1 tablet (10 mg total) by mouth every evening.  . tiotropium (SPIRIVA HANDIHALER) 18 MCG inhalation capsule Place 1 capsule (18 mcg total) into inhaler and inhale daily.  Marland Kitchen zolpidem (AMBIEN) 5 MG tablet Take 1 tablet (5 mg total) by mouth at bedtime.  . [DISCONTINUED] albuterol (PROVENTIL HFA;VENTOLIN HFA) 108 (90 Base) MCG/ACT inhaler Inhale 2 puffs into the lungs every 6 (six) hours as needed for wheezing or shortness of breath.   No facility-administered encounter medications on file as of 12/25/2015.     Allergies  Allergen Reactions  . Latex Rash  . Penicillins Rash    Current Medications, Allergies, Past Medical History, Past Surgical History, Family History, and Social History were reviewed in [employer] record.   Review of Systems             All symptoms NEG except where BOLDED >>  Constitutional:  F/C/S, fatigue, anorexia, unexpected weight change. HEENT:  HA, visual changes, hearing loss, earache, nasal symptoms, sore throat, mouth sores, hoarseness. Resp:  cough, sputum, hemoptysis; SOB, tightness, wheezing. Cardio:  CP, palpit, DOE, orthopnea, edema. GI:  N/V/D/C, blood in stool; reflux, abd pain, distention, gas. GU:  dysuria, freq,  urgency, hematuria, flank pain, voiding difficulty. MS:  joint pain, swelling, tenderness, decr ROM; neck pain, back pain, etc. Neuro:  HA, tremors, seizures, dizziness, syncope, weakness, numbness, gait abn. Skin:  suspicious lesions or skin rash. Heme:  adenopathy, bruising, bleeding. Psyche:  confusion, agitation, sleep disturbance, hallucinations, anxiety, depression suicidal.   Objective:   Physical Exam       Vital Signs:  Reviewed...   General:  WD, WN, 80 y/o WF in NAD; alert & oriented; pleasant & cooperative... HEENT:  [place]/AT; Conjunctiva- pink, Sclera- nonicteric, EOM-wnl, PERRLA, EACs-clear, TMs-wnl; NOSE-clear; THROAT-clear & wnl.  Neck:  Supple w/ fairROM; no JVD; normal carotid impulses w/o bruits; no thyromegaly or nodules palpated; no lymphadenopathy.  Chest:  Decr BS bilat,  few scat rhonchi otherw clear not congested no wheezing rales or signs of consolidation... Heart:  Regular Rhythm; norm S1 & S2 w/ Gr1/6 SEM w/o rubs or gallops detected... Abdomen:  Soft & nontender, +panniculus- no guarding or rebound; normal bowel sounds; no organomegaly or masses palpated. Ext:  decrROM; without deformities +arthritic changes; +venous insuffic, no edema;  Pulses intact w/o bruits. Neuro:  No focal neuro deficit; abn gait & balance poor, uses cane... Derm:  No lesions noted; no rash etc. Lymph:  No cervical, supraclavicular, axillary, or inguinal adenopathy palpated.   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    Cardiac> on Coreg6.25- 1/2Bid for ?HBP; she has mild AS & DiastCHF on 2DEcho and this needs follow up w/ her PCP vs Cards...    Endocrine> IDDM on Lantus & Novolog (A1c=6.3), HL on Crestor10 (FLP- controlled), Hypothyroid on Synthroid50, transiently elev serum calcium ?etiology (resolved spont), osteoporosis w/ mult fxs on Prolia Q70mo; managed by DrGherghe & last seen 06/09/15- note reviewed...    GI> She presented w/ symptoms of reflux/ LPR w/ cough while eating etc => needs GI eval & we will refer (see recs below)...    GYN> Hx ovarian cancer (in remission- details unknown), s/p hyst & Ca125=9 (08/2014)... She is followed by DrFernandez now...    Ortho/ Rheum> DJD w/ TKR, FM on Lyrica, Tramadol, Percocet,etc, severe LBP requires epidural shots; she is on a huge dose of Lyrica; PMR on Pred (details unknown); her Sed=52 and she saw [facility] for Rheum last 09/03/15- severe OA, FM, PMR, & osteoporosis on Lyrica, Pred, Percocet, & Prolia; she felt worse when out of her Lyrica    Neuro> on Aricept5 per DrCrawford, I wonder if she is taking any/ all of her meds properly, no care giver accompanies pt to office visits, she is ?stoic ?oblivious...    Heme> Hg 10-11 range May2016 after fall w/ fx nose & wrist; similar now & may need further anemia work-up as well, deferred to PCP...  PLAN >>   01/19/15>   We discussed Rx w/ ZPak for her URI (it could also help her if she has atyp-mycobacterium); we compromised rx w/ NEBS TID- followed by AdvairBid & Spiriva once daily, plus the MUCINEX1200mg Bid w/ fluids;  She again declines the added benefit from a vigorous chest PT regimen, flutter valve, postural drainage, purse lip breathing, etc... 06/23/15>   We again outlined for Kayla Thompson a vigorous  home treatment regimen for her bronchiectasis & mucoid impactions including a TID regimen>  NEB w/ Albut vs Duoneb Tid, followed by her Advair500Bid & Spiriva once/d, Guaifenesin 400mg  tabs- 2Tid w/ fluids, take her low dose Pred in AM & Singulair in PM; she declines to do chest PT/ Flutter & postural drainage treatments... 10/01/15>   Kayla Thompson has a URI & mild COPD exac=> we will treat  w/ Doxy100Bid (her request) and Medrol dosepak;  We again reviewed the recommended treatment protocol for her bronchiectasis (it was like she heard it for the 1st time)>  NEBS Tid, followed by [facility] & SPIRIVA daily, +Guaifenesin400-2Tid + Fluids, take the low dose Pred in AM (per Rheum) and the Singulair10 in PM, work to expectorate the phlegm but she declines to do chest PT, Flutter, postural drainage, etc... we plan ROV recheck in 64mo... 12/25/15>   Kayla Thompson has serious multisys dis & is managed by DrCrawford-PCP;  From the pulm standpoint she continues to downplay the severity of her lung dis & still refuses/ forgets/ etc to do her breathing treatments as we have outlined for her- REC to use NEB Tid, followed by Advair500Bid, Spiriva once dailyGFN400-2Tid w/ fluids, Singulair10, & Flutter valve/ postural drain/ purse-lip breathig treatments TID... Today we reviewed this regimen & added a Medrol Dosepak... She now appears to have signif GI-GERD/ reflux/ prob LPR & we added a vigorous antireflux regimen=> Prevacid30 taken 30 miin before dinner, NPO after dinner, elev HOB 6" blocks... She needs to see GI for further eval & needs these recommendations reinforced by them & her PCP.     Plan:     Patient's Medications  New Prescriptions   HYDROCODONE-HOMATROPINE (HYCODAN) 5-1.5 MG/5ML SYRUP    Take 5 mLs by mouth every 6 (six) hours as needed for cough.   METHYLPREDNISOLONE (MEDROL) 4 MG TBPK TABLET    Take as directed  Previous Medications   ALBUTEROL (PROVENTIL) (2.5 MG/3ML) 0.083% NEBULIZER SOLUTION    Take 3 mLs (2.5  mg total) by nebulization daily.   CARVEDILOL (COREG) 6.25 MG TABLET    Take 3.125 mg by mouth 2 (two) times daily with a meal.    DENOSUMAB (PROLIA) 60 MG/ML SOLN INJECTION    Inject 60 mg into the skin every 6 (six) months. Administer in upper arm, thigh, or abdomen   DEXLANSOPRAZOLE 30 MG CAPSULE    Take 1 capsule (30 mg total) by mouth daily.   DEXTROMETHORPHAN-GUAIFENESIN (MUCINEX DM MAXIMUM STRENGTH) 60-1200 MG TB12    Take 1 tablet by mouth 2 (two) times daily as needed (for cough/phlegm). Reported on 06/23/2015   DONEPEZIL (ARICEPT) 5 MG TABLET    TAKE 1 TABLET (5 MG TOTAL) BY MOUTH AT BEDTIME.   DULOXETINE (CYMBALTA) 60 MG CAPSULE    Take 60 mg by mouth daily.   FLUTICASONE (FLONASE) 50 MCG/ACT NASAL SPRAY    Place 1-2 sprays into both nostrils daily as needed for allergies or rhinitis.   FLUTICASONE-SALMETEROL (ADVAIR DISKUS) 500-50 MCG/DOSE AEPB    Inhale 1 puff into the lungs 2 (two) times daily.   GLUCOSE BLOOD (FREESTYLE TEST STRIPS) TEST STRIP    Use to test blood sugar 3 times daily. Dx: E11.65   INSULIN ASPART (NOVOLOG FLEXPEN) 100 UNIT/ML FLEXPEN    Inject 4-6 units into the skin daily   INSULIN GLARGINE (LANTUS SOLOSTAR) 100 UNIT/ML SOLOSTAR PEN    Inject 30 Units into the skin every morning.   INSULIN PEN NEEDLE 32G X 4 MM MISC    Use 4x a day   LANCETS (FREESTYLE) LANCETS    Use to test blood sugar 3 times daily. Dx: E11.65   LEVOTHYROXINE (SYNTHROID, LEVOTHROID) 50 MCG TABLET    Take 1 tablet (50 mcg total) by mouth daily before breakfast.   LYRICA 300 MG CAPSULE    TAKE ONE CAPSULE BY MOUTH TWO TIMES DAILY   MONTELUKAST (SINGULAIR) 10 MG TABLET  TAKE 1 TABLET (10 MG TOTAL) BY MOUTH DAILY.   MONTELUKAST (SINGULAIR) 10 MG TABLET    TAKE 1 TABLET (10 MG TOTAL) BY MOUTH DAILY.   NON FORMULARY    1 Syringe by Epidural route every 3 (three) months. Epidural Steroid Injections for Lumbar Spinal Stenosis up to 4 times a year   OXYCODONE-ACETAMINOPHEN (PERCOCET/ROXICET) 5-325 MG  TABLET    Take 1 tablet by mouth 2 (two) times daily as needed for severe pain.   PREDNISONE (DELTASONE) 1 MG TABLET    Take 2 mg by mouth daily with breakfast.    RANITIDINE (ZANTAC) 150 MG TABLET    Take 150 mg by mouth 2 (two) times daily as needed for heartburn.   ROSUVASTATIN (CRESTOR) 10 MG TABLET    Take 1 tablet (10 mg total) by mouth every evening.   TIOTROPIUM (SPIRIVA HANDIHALER) 18 MCG INHALATION CAPSULE    Place 1 capsule (18 mcg total) into inhaler and inhale daily.   ZOLPIDEM (AMBIEN) 5 MG TABLET    Take 1 tablet (5 mg total) by mouth at bedtime.  Modified Medications   Modified Medication Previous Medication   ALBUTEROL (PROVENTIL HFA;VENTOLIN HFA) 108 (90 BASE) MCG/ACT INHALER albuterol (PROVENTIL HFA;VENTOLIN HFA) 108 (90 Base) MCG/ACT inhaler      Inhale 2 puffs into the lungs every 6 (six) hours as needed for wheezing or shortness of breath.    Inhale 2 puffs into the lungs every 6 (six) hours as needed for wheezing or shortness of breath.  Discontinued Medications   No medications on file

## 2015-12-26 ENCOUNTER — Encounter: Payer: Self-pay | Admitting: Internal Medicine

## 2015-12-26 DIAGNOSIS — Z79899 Other long term (current) drug therapy: Secondary | ICD-10-CM | POA: Diagnosis not present

## 2015-12-26 DIAGNOSIS — Z79891 Long term (current) use of opiate analgesic: Secondary | ICD-10-CM | POA: Diagnosis not present

## 2015-12-27 ENCOUNTER — Other Ambulatory Visit: Payer: Self-pay | Admitting: Internal Medicine

## 2015-12-29 DIAGNOSIS — M1712 Unilateral primary osteoarthritis, left knee: Secondary | ICD-10-CM | POA: Diagnosis not present

## 2015-12-29 DIAGNOSIS — M25562 Pain in left knee: Secondary | ICD-10-CM | POA: Diagnosis not present

## 2015-12-29 DIAGNOSIS — M19021 Primary osteoarthritis, right elbow: Secondary | ICD-10-CM | POA: Diagnosis not present

## 2015-12-30 ENCOUNTER — Other Ambulatory Visit: Payer: Self-pay

## 2015-12-30 ENCOUNTER — Telehealth: Payer: Self-pay | Admitting: Internal Medicine

## 2015-12-30 MED ORDER — INSULIN LISPRO 100 UNIT/ML (KWIKPEN): PEN_INJECTOR | SUBCUTANEOUS | 1 refills | Status: DC

## 2015-12-30 NOTE — Telephone Encounter (Signed)
Yes, okay to switch.

## 2015-12-30 NOTE — Telephone Encounter (Signed)
Patient stated that her insurance will not cover the Novalog but will cover the Humalog.please advise

## 2015-12-31 ENCOUNTER — Other Ambulatory Visit: Payer: Self-pay | Admitting: Internal Medicine

## 2016-01-13 DIAGNOSIS — F331 Major depressive disorder, recurrent, moderate: Secondary | ICD-10-CM | POA: Diagnosis not present

## 2016-01-19 ENCOUNTER — Telehealth: Payer: Self-pay

## 2016-01-19 NOTE — Telephone Encounter (Signed)
Patients next prolia injection is due either on/after 02/02/16---I am verifying insurance benefits and will call patient to schedule appt---can talk with tamara if any questions

## 2016-01-26 ENCOUNTER — Encounter: Payer: Self-pay | Admitting: Gastroenterology

## 2016-01-28 ENCOUNTER — Telehealth: Payer: Self-pay

## 2016-01-28 NOTE — Telephone Encounter (Signed)
Talked with patient, insurance has been verified for prolia injection---estimated $0 copay---patient has scheduled nurse visit

## 2016-01-29 ENCOUNTER — Other Ambulatory Visit: Payer: Self-pay | Admitting: Internal Medicine

## 2016-02-03 DIAGNOSIS — E119 Type 2 diabetes mellitus without complications: Secondary | ICD-10-CM | POA: Diagnosis not present

## 2016-02-03 DIAGNOSIS — M150 Primary generalized (osteo)arthritis: Secondary | ICD-10-CM | POA: Diagnosis not present

## 2016-02-03 DIAGNOSIS — E039 Hypothyroidism, unspecified: Secondary | ICD-10-CM | POA: Diagnosis not present

## 2016-02-03 DIAGNOSIS — I10 Essential (primary) hypertension: Secondary | ICD-10-CM | POA: Diagnosis not present

## 2016-02-03 DIAGNOSIS — E7801 Familial hypercholesterolemia: Secondary | ICD-10-CM | POA: Diagnosis not present

## 2016-02-04 DIAGNOSIS — N3941 Urge incontinence: Secondary | ICD-10-CM | POA: Diagnosis not present

## 2016-02-04 DIAGNOSIS — R300 Dysuria: Secondary | ICD-10-CM | POA: Diagnosis not present

## 2016-02-04 DIAGNOSIS — R3121 Asymptomatic microscopic hematuria: Secondary | ICD-10-CM | POA: Diagnosis not present

## 2016-02-04 DIAGNOSIS — R351 Nocturia: Secondary | ICD-10-CM | POA: Diagnosis not present

## 2016-02-05 DIAGNOSIS — N390 Urinary tract infection, site not specified: Secondary | ICD-10-CM | POA: Diagnosis not present

## 2016-02-11 ENCOUNTER — Other Ambulatory Visit: Payer: Self-pay

## 2016-02-11 MED ORDER — MONTELUKAST SODIUM 10 MG PO TABS: ORAL_TABLET | ORAL | 3 refills | Status: DC

## 2016-02-16 DIAGNOSIS — E1165 Type 2 diabetes mellitus with hyperglycemia: Secondary | ICD-10-CM | POA: Diagnosis not present

## 2016-02-16 DIAGNOSIS — H04121 Dry eye syndrome of right lacrimal gland: Secondary | ICD-10-CM | POA: Diagnosis not present

## 2016-02-16 DIAGNOSIS — H25813 Combined forms of age-related cataract, bilateral: Secondary | ICD-10-CM | POA: Diagnosis not present

## 2016-02-16 DIAGNOSIS — H04122 Dry eye syndrome of left lacrimal gland: Secondary | ICD-10-CM | POA: Diagnosis not present

## 2016-02-16 DIAGNOSIS — H04123 Dry eye syndrome of bilateral lacrimal glands: Secondary | ICD-10-CM | POA: Diagnosis not present

## 2016-02-16 DIAGNOSIS — H1851 Endothelial corneal dystrophy: Secondary | ICD-10-CM | POA: Diagnosis not present

## 2016-02-18 DIAGNOSIS — F331 Major depressive disorder, recurrent, moderate: Secondary | ICD-10-CM | POA: Diagnosis not present

## 2016-02-24 ENCOUNTER — Ambulatory Visit (INDEPENDENT_AMBULATORY_CARE_PROVIDER_SITE_OTHER): Payer: Medicare Other | Admitting: General Practice

## 2016-02-24 DIAGNOSIS — Z79899 Other long term (current) drug therapy: Secondary | ICD-10-CM | POA: Diagnosis not present

## 2016-02-24 DIAGNOSIS — Z5181 Encounter for therapeutic drug level monitoring: Secondary | ICD-10-CM

## 2016-02-24 MED ORDER — DENOSUMAB 60 MG/ML ~~LOC~~ SOLN
60.0000 mg | Freq: Once | SUBCUTANEOUS | Status: AC
  Administered 2016-02-24: 60 mg via SUBCUTANEOUS

## 2016-02-24 NOTE — Progress Notes (Signed)
Medical treatment/procedure(s) were performed by non-physician practitioner and as supervising physician I was immediately available for consultation/collaboration. I agree with above. Elizabeth A Crawford, MD  

## 2016-03-23 DIAGNOSIS — M2550 Pain in unspecified joint: Secondary | ICD-10-CM | POA: Diagnosis not present

## 2016-03-23 DIAGNOSIS — D638 Anemia in other chronic diseases classified elsewhere: Secondary | ICD-10-CM | POA: Diagnosis not present

## 2016-03-23 DIAGNOSIS — M353 Polymyalgia rheumatica: Secondary | ICD-10-CM | POA: Diagnosis not present

## 2016-03-23 DIAGNOSIS — Z6830 Body mass index (BMI) 30.0-30.9, adult: Secondary | ICD-10-CM | POA: Diagnosis not present

## 2016-03-23 DIAGNOSIS — M797 Fibromyalgia: Secondary | ICD-10-CM | POA: Diagnosis not present

## 2016-03-23 DIAGNOSIS — M150 Primary generalized (osteo)arthritis: Secondary | ICD-10-CM | POA: Diagnosis not present

## 2016-03-23 DIAGNOSIS — E669 Obesity, unspecified: Secondary | ICD-10-CM | POA: Diagnosis not present

## 2016-03-23 DIAGNOSIS — M154 Erosive (osteo)arthritis: Secondary | ICD-10-CM | POA: Diagnosis not present

## 2016-03-24 DIAGNOSIS — F331 Major depressive disorder, recurrent, moderate: Secondary | ICD-10-CM | POA: Diagnosis not present

## 2016-04-05 ENCOUNTER — Ambulatory Visit: Payer: Medicare Other | Admitting: Gastroenterology

## 2016-04-06 ENCOUNTER — Encounter: Payer: Self-pay | Admitting: Internal Medicine

## 2016-04-06 ENCOUNTER — Ambulatory Visit (INDEPENDENT_AMBULATORY_CARE_PROVIDER_SITE_OTHER): Payer: Medicare Other | Admitting: Internal Medicine

## 2016-04-06 DIAGNOSIS — F411 Generalized anxiety disorder: Secondary | ICD-10-CM | POA: Diagnosis not present

## 2016-04-06 DIAGNOSIS — R059 Cough, unspecified: Secondary | ICD-10-CM

## 2016-04-06 DIAGNOSIS — J4530 Mild persistent asthma, uncomplicated: Secondary | ICD-10-CM

## 2016-04-06 DIAGNOSIS — R05 Cough: Secondary | ICD-10-CM | POA: Diagnosis not present

## 2016-04-06 MED ORDER — HYDROCODONE-HOMATROPINE 5-1.5 MG/5ML PO SYRP: 5.0000 mL | ORAL_SOLUTION | Freq: Four times a day (QID) | ORAL | 0 refills | Status: DC | PRN

## 2016-04-06 MED ORDER — PREGABALIN 300 MG PO CAPS: 300.0000 mg | ORAL_CAPSULE | Freq: Two times a day (BID) | ORAL | 1 refills | Status: DC

## 2016-04-06 MED ORDER — OXYCODONE-ACETAMINOPHEN 5-325 MG PO TABS: 1.0000 | ORAL_TABLET | Freq: Two times a day (BID) | ORAL | 0 refills | Status: DC | PRN

## 2016-04-06 MED ORDER — ZOLPIDEM TARTRATE 5 MG PO TABS: 5.0000 mg | ORAL_TABLET | Freq: Every day | ORAL | 2 refills | Status: DC

## 2016-04-06 MED ORDER — BUSPIRONE HCL 10 MG PO TABS: 10.0000 mg | ORAL_TABLET | Freq: Two times a day (BID) | ORAL | 0 refills | Status: DC

## 2016-04-06 NOTE — Progress Notes (Signed)
Pre visit review using our clinic review tool, if applicable. No additional management support is needed unless otherwise documented below in the visit note. 

## 2016-04-06 NOTE — Patient Instructions (Signed)
We have given you the refills to take to the pharmacy.   We have sent in a medicine for anxiety which is safe for you to take called buspar that you can take only as needed up to twice per day.   You can use the cough syrup to help with the cough. You do not need any antibiotics or steroids today.

## 2016-04-06 NOTE — Progress Notes (Signed)
   Subjective:    Patient ID: Kayla Thompson, female    DOB: [birth year], 81 y.o.   MRN: [id number]  HPI The patient is a 81 YO female coming in for sick visit. Initially she was having GI symptoms but these have resolved fully. She is now having some sore throat for the last 4 days or so. She is not having fevers or chills. She has taken some tylenol at home which helped with the pain. She is having some sinus drainage and congestion as well. Some cough as well but no new SOB. She has been trying some cough drops at home which have not been working well. She denies using her inhalers more.  She is also having more anxiety at her home and is requesting rx for xanax which she has been on in the past for short term.   Review of Systems  Constitutional: Positive for fatigue. Negative for activity change, appetite change, chills, fever and unexpected weight change.  HENT: Positive for congestion, postnasal drip and rhinorrhea. Negative for dental problem, drooling, ear discharge, ear pain, sinus pain, sinus pressure, sore throat and trouble swallowing.   Eyes: Negative.   Respiratory: Positive for cough. Negative for chest tightness, shortness of breath and wheezing.   Cardiovascular: Negative.   Gastrointestinal: Negative.   Musculoskeletal: Negative.       Objective:   Physical Exam  Constitutional: She is oriented to person, place, and time. She appears well-developed and well-nourished.  HENT:  Head: Normocephalic and atraumatic.  Oropharynx with redness and clear drainage, no nasal crusting, TMs normal bilaterally, no sinus pain.   Eyes: EOM are normal.  Neck: Normal range of motion.  Cardiovascular: Normal rate and regular rhythm.   Pulmonary/Chest: Effort normal and breath sounds normal. No respiratory distress. She has no wheezes. She has no rales.  Lungs sound stable from prior.   Abdominal: Soft.  Lymphadenopathy:    She has no cervical adenopathy.  Neurological: She is alert and  oriented to person, place, and time.  Skin: Skin is warm and dry.  Psychiatric:  Upset when she found out that I would not prescribe xanax and kept asking for the same thing multiple times.    Vitals:   04/06/16 1525  BP: 140/82  Pulse: 80  Resp: 12  Temp: 97.7 F (36.5 C)  TempSrc: Oral  SpO2: 98%  Weight: 157 lb (71.2 kg)  Height: 5' (1.524 m)      Assessment & Plan:

## 2016-04-09 NOTE — Assessment & Plan Note (Signed)
Discussed black box warning with benzos and her oxycodone and will not rx for her. Rx for buspar for her anxiety. She is also taking cymbalta daily. We discussed non-medication ways of reducing stress and anxiety and she will think about trying those.

## 2016-04-09 NOTE — Assessment & Plan Note (Signed)
Rx for hycodan cough syrup for the cough. She does have a likely viral illness and does not have indication for steroids or antibiotics today.

## 2016-04-09 NOTE — Assessment & Plan Note (Signed)
No flare today and no indication for steroids or antibiotics. Likely viral illness and no increase in inhaler usage.

## 2016-04-13 ENCOUNTER — Ambulatory Visit (INDEPENDENT_AMBULATORY_CARE_PROVIDER_SITE_OTHER): Payer: Medicare Other | Admitting: Pulmonary Disease

## 2016-04-13 ENCOUNTER — Telehealth: Payer: Self-pay | Admitting: Pulmonary Disease

## 2016-04-13 VITALS — BP 110/70 | HR 76 | Temp 98.6°F | Ht 60.0 in | Wt 159.4 lb

## 2016-04-13 DIAGNOSIS — Z794 Long term (current) use of insulin: Secondary | ICD-10-CM | POA: Diagnosis not present

## 2016-04-13 DIAGNOSIS — I5032 Chronic diastolic (congestive) heart failure: Secondary | ICD-10-CM

## 2016-04-13 DIAGNOSIS — J479 Bronchiectasis, uncomplicated: Secondary | ICD-10-CM

## 2016-04-13 DIAGNOSIS — F411 Generalized anxiety disorder: Secondary | ICD-10-CM

## 2016-04-13 DIAGNOSIS — K219 Gastro-esophageal reflux disease without esophagitis: Secondary | ICD-10-CM | POA: Diagnosis not present

## 2016-04-13 DIAGNOSIS — M150 Primary generalized (osteo)arthritis: Secondary | ICD-10-CM

## 2016-04-13 DIAGNOSIS — I10 Essential (primary) hypertension: Secondary | ICD-10-CM

## 2016-04-13 DIAGNOSIS — M353 Polymyalgia rheumatica: Secondary | ICD-10-CM | POA: Diagnosis not present

## 2016-04-13 DIAGNOSIS — E1121 Type 2 diabetes mellitus with diabetic nephropathy: Secondary | ICD-10-CM | POA: Diagnosis not present

## 2016-04-13 DIAGNOSIS — E039 Hypothyroidism, unspecified: Secondary | ICD-10-CM

## 2016-04-13 DIAGNOSIS — M159 Polyosteoarthritis, unspecified: Secondary | ICD-10-CM

## 2016-04-13 DIAGNOSIS — M8949 Other hypertrophic osteoarthropathy, multiple sites: Secondary | ICD-10-CM

## 2016-04-13 MED ORDER — MONTELUKAST SODIUM 10 MG PO TABS: ORAL_TABLET | ORAL | 3 refills | Status: DC

## 2016-04-13 NOTE — Telephone Encounter (Signed)
Order has been placed and SN has been asked to sign order.  I have made [facility] aware of this. Nothing further needed.

## 2016-04-13 NOTE — Patient Instructions (Addendum)
Today we updated your med list in our EPIC system...    Continue your current medications the same...  Continue the NEBULIZER w/ Albuterol 3 times daily...    Followed by the ADVAIR twice daily & the [facility] once daily...    Follow the treatments w/ the FLUTTER VALVE to aide in sputum expectoration...    Continue the GUAIFENESIN 400mg  tabs ([employer]) taking 2 tabs 3 times daily w/ FLUIDS...  Continue the ANTIREFLUX regimen>>     Take the DEXLANSOPRAZOLE (Prevacid) 30mg  about 30 min before dinner in the eve...    Do not eat or drink after dinner...    Keep the head of your bed elevated on 6" blocks (~30 degrees elevation)     Your oxygen level dropped with minimal ambulation now so you will need a portable oxygen system to help you get about safely... We will contact a DME company to provide the needed portable Oxygen concentrator at 2L/min flow...  Call for any questions...  Let's plan a follow up visit in 9mo, sooner if needed for problems.Marland KitchenMarland Kitchen

## 2016-04-14 DIAGNOSIS — F419 Anxiety disorder, unspecified: Secondary | ICD-10-CM | POA: Diagnosis not present

## 2016-04-14 DIAGNOSIS — F331 Major depressive disorder, recurrent, moderate: Secondary | ICD-10-CM | POA: Diagnosis not present

## 2016-04-15 ENCOUNTER — Telehealth: Payer: Self-pay | Admitting: Pulmonary Disease

## 2016-04-15 NOTE — Telephone Encounter (Signed)
Pt's questions about her flutter valve have been answered. Pt states she not not currently find it, however she will call us back if she is unable to find it. Pt is also concerned about her oxygen. Pt states she has not taken any of her medication that day of her apt with SN, pt feels that this may be why her O2 levels were low. Pt states she would like to call us back once she is feeling better to be reevaluated for her O2 use.  Nothing further needed.

## 2016-04-16 ENCOUNTER — Telehealth: Payer: Self-pay | Admitting: Pulmonary Disease

## 2016-04-16 DIAGNOSIS — J479 Bronchiectasis, uncomplicated: Secondary | ICD-10-CM

## 2016-04-16 NOTE — Progress Notes (Signed)
Subjective:     Patient ID: Kayla Thompson, female   DOB: [birth year], 81 y.o.   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  ~  January 07, 2015:  Initial pulmonary consult w/ SN>      6 y/o WF, referred by [name]) for a pulmonary evaluation due to cough & dyspnea>  Dosia moved to [place] about 13mo ago to be near her daughter, granddaughter, and great-grands after her husb passed away;  Shortly after arrival she fell & was [facility] w/ fx nasal bones and left wrist (distal radius & ulna) managed by Burnett Sheng summary reviewed... She established Primary Care w/ DrKollar, [place], Endocrine- DrGherghe... She has a Hx of pulmonary problems identified by the Pt & records from [facility] as Bronchiectasis and Asthma;  Her CC is SOB/ DOE w/ min activity & it is apparent that she is rather sedentary and a poor historian; she notes mild cough, some yellow sputum, no hemoptysis; she denies CP, palpit, or signif cardiac history...  Smoking Hx>  She is a never smoker, but had signif 2nd hand exposure from her husb...  Pulmonary Hx>  She indicates hx left lung surg 2000 for benign granuloma; hx bronchiectasis but she does not know how the Dx was established; told asthma as well ever since early 2000's but not as a child or young adult; states she had pneumonia age 89 at same time as the measles (not hosp & made full recovery w/o apparent residual resp problems); she does admit to recurrent bronchitic infections as adult requiring antibiotics & occas Pred rx... She adds a rambling hx of work-ups at [facility] (in [place] in [place] but she does not know what this was for?       Notes from Dr. Mick Sell in [place]> Bronchiectasis, mult pulm nodules, & mild Asthma; prev pulm nodule resected in 2000 which proved to be a granuloma; he reported that an atypical  mycobacterium was found at the time of lung bx in the past- no further details provided; seen for cough w/ yellow sput & incr dyspnea- reported to be stable on Advair500Bid, Spiriva daily, Singulair10, NEB w/ Albut-Bid, Mucinex, VentolinHFA rescue prn; she was also on Pred 3mg /d for PMR from her Rheum;   Medical Hx>  Diastolic Dysfunction on 2DEcho, mild AS, HL, IDDM, Hypothyroid, GERD, DJD w/ TKR and ankle surg; Fibromyalgia on Lyrica/ Percocet/ Tramadol; PMR per Rheum in [place] on Pred2mg /d x 42yr (it was last dropped from 3mg  ~80yr ago); Osteoporosis; Anemia; Anxiety.   Family Hx>  Mother died from lung cancer (as did one of her sisters);  Father passed from heart disease.  Occup Hx>  No known occupational exposures  Current Meds>  SEE FULL MED LIST BELOW EXAM showed Afeb, VSS, O2sat=99% on RA;  Heent- neg, mallampati3;  Chest- decr BS bilat, not congested, w/o w/r/r heard;  Heart- RR gr1/6SEM w/o r/g detected;  Abd- soft, nontender, +panniculus;  Ext- VI w/o c/c/e...   CT Chest 11/17/09 in [place]> mult noncalcif pulm nodules bilat (stable when compared to older scans), mucoid impaction LUL & RML, pleuroparenchymal scarring right base (unchanged), no adenopathy, norm heart size & coronary calcif.  PFTs 02/04/10 in [place]> FVC=2.20 (95%), FEV1=1.42 (88%), %1sec=65, mid-flows reduced at 38% predicted; no improvement in FEV1 after bronchodil; TLC=4.70 (118%), RV=2.48 (153%), RV/TLC=53%; DLCO=54% predicted.  CXR 01/14/14 reported to show norm heart  size, clear lungs, thoracic spondylosis, NAD...   CXR 08/02/14 in ER here after fall showed norm heart size, diffuse interstitial coarsening w/o focal airsp dis...   2DEcho 08/02/14 in [place] after fall showed norm LVF w/ EF=55-60%, no regional wall motion abn, Gr1 DD, mild AS w/ mean gradient 48mmHg; mod calcif MV leaflets w/o stenosis or regurg; RV looked OK but could not est PAsys...  CXR here 01/07/15 showed norm heart size, incr AP diameter of chest & flat  diaphragms, incr markings diffusely, old right rib fxs, osteopenia...   Spirometry 01/07/15> we attempted mult trial of simple spirometry but pt could not generate an acceptable curve & results are not reliable...  Ambulatory oxygen saturation test 01/07/15> O2sat=98% on RA at rest w/ pulse=79/min; she ambulated 2 laps in office & stopped w/ dyspnea, lowest O2sat=87% w/ pulse=95/min  LABS 01/07/15>  Chems- wnl x BS=45;  CBC- Hg=11.0, MCV=87, WBC=16.3, diff ok;  Sed=52 IMP/PLAN>>  Complex 81 y/o woman:    Pulmonary> Hx COPD/ Bronchiectasis, Hx granulomatous lung dis & reported atyp mycobacterium in the past, ?other ILD- awaiting Hi-res CT Chest results; she has clinical hx asthma in the past but no reversibility on prev PFTs; REC to continue Advair500, Spiriva, NEBS, Singulair, Mucinex, Ventolin HFA for now; may need further ILD work-up w/ collagen-vasc screen QuantGold, sputum studies, etc... We will follow closely.    Cardiac> on Coreg6.25Bid for ?HBP; she has mild AS & DiastCHF on 2DEcho and this needs follow up...    Endocrine> IDDM, Hypothyroid, transiently elev serum calcium ?etiology (resolved spont); managed by DrGherghe; BS today is 45 & asked to eat & check w/ endocrine re- adjustment in Novolog...    GYN> Hx ovarian cancer (in remission- details unknown), s/p hyst    Ortho/ Rheum> DJD w/ TKR, FM on Lyrica etc, severe LBP requires epidural shots; PMR on Pred (details unknown); her Sed=52 and she will prob need Rheum to help sort this out...    Heme> Hg 10-11 range May2016 after fall w/ fx nose & wrist; similar now & may need further anemia work-up as well...  ADDENDUM>>  Hi-res CT Chest 01/14/15:  Norm heart size, atherosclerosis of Ao/ great vessels/ coronaries, no adenopathy, atrophic thyroid; ?small sliding pulm hernia betw the lat 7th & 8th ribs w/ scarring; extensive bronchiectasis bilat w/ mucoid impactions within dilated airways (infectious/inflamm bronchiolitis- r/o MAI), 1mm LLL  nodule, tracheobronchomalacia w/ extensive bilat air trapping on expiration, smallHH, old healed right 6th-7th rib fx, DJD Tspine...  REC> continue NEBS, Advair, Spiriva, Mucinex (maximize to 600mg  Qid w/ fluids), practice purse lip breathing to splint the airways open & aide in expectoration of phlegm, use FLUTTER valve & postural drainage to get the sput out; collect sput for routine C&S, AFB, Fungi => TC w/ pt: she has a lot going on, refuses further meds, refuses further eval, can't be bothered w/ learning Chest PT/ Flutter, postural drainage, purse lip breathing, collecting sputum specimen, etc; states she's had all this a long time & not interested in more aggressive pulmonary treatments at this time... She will f/u w/ me in November & we will discuss again.  ADDENDUM>>  Records from Dr. Mick Sell, Pulmonary in [place]=> note of 07/18/13:  Hx mild asthma & bronchiectasis, hx mult pulm nodules followed for several yrs & one removed surgically in 2000 that proved to be a granuloma & report of an atypical mycobacterium being found at the same time (but no details given), on NEB w/ Albuterol,  Advair, Spiriva, Singulair, Mucinex, low-dose Pred for PMR;  She was given all indicated vaccinations;  No change in her med regimen for yrs...   ~  February 19, 2015:  6wk ROV w/ SN>  SEE ABOVE-- pt notes nasal congestion, chest congestion, sl cough & incr SOB for the last wk or so; in the next breath she says "I seldom get a cold", thinks it's all allergies, notes great-grnads recently ill;  She has NEBS w/ Albut (only using once per day), Advair500Bid, Spiriva once daily, Mucinex (?how much), Pred?1mg - 3/d, Singulair10/ Zyrtek10...  Since she was here last she has followed up w/ PCP-DrCrawford 10/25> FM doing ok on Lyrica & she reported more functional & resting better...   She also had a f/u w/ Endocrine-DrGherghe 11/8> DM, osteoporosis, hypothyroid, & Hx hyperpara; Labs reviewed & ok (A1c=6.5 on her insulin,  Ca is wnl and vitD=66)...    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     Cardiac> on Coreg6.25Bid for ?HBP; she has mild AS & DiastCHF on 2DEcho and this needs follow up w/ her PCP vs Cards...    Endocrine> IDDM, Hypothyroid, transiently elev serum calcium ?etiology (resolved spont); managed by DrGherghe...    GYN> Hx ovarian cancer (in remission- details unknown), s/p hyst & Ca125=9 (08/2014)...    Ortho/ Rheum> DJD w/ TKR, FM on Lyrica, Tramadol, Percocet,etc, severe LBP requires epidural shots; PMR on Pred (details unknown); her Sed=52 and she will prob need Rheum to help sort this out...    Heme> Hg 10-11 range May2016 after fall w/ fx nose & wrist; similar now & may need further anemia work-up as well... EXAM showed Afeb, VSS, O2sat=93% on RA;  Wt=162#;  Heent- neg, mallampati3;  Chest- decr BS bilat, not congested, w/o w/r/r heard;  Heart- RR gr1/6SEM w/o r/g detected;  Abd- soft, nontender, +panniculus;  Ext- VI w/o c/c/e...   Pt given a neb treatment in office- unable to produce any sput for cultures... IMP/PLAN>>  We discussed Rx w/ ZPak for her URI (it could also help her if she has atyp-mycobacterium); we compromised rx w/ NEBS TID- followed by AdvairBid & Spiriva once daily, plus the MUCINEX1200mg Bid w/ fluids;  She again declines the added benefit from a vigorous chest PT regimen, flutter valve, postural drainage, purse lip breathing, etc... we plan a recheck in 3 months...  ~  June 23, 2015:  75mo ROV  w/ SN>  Alonna returns unaccompanied and states that her breathing is good- no problem she says; notes min cough, sm amt whitish sput (no color, no blood), admits to mild SOB/DOE but swears there is no progression, and denies CP/ f/c/s/ etc... She states that she was INTOL to ZPak given last OV but can't recall the reaction ("?didn't work for me"), doesn't want it again;  We reviewed her meds & again she is not being aggressive enough to effectively treat her bronchiectasis & mucoid impactions> eg. Only using the Albut NEBS prn & ave once Qod, only using the Mucinex 1200 once daily, she says she is using the ADVAIR500Bid, SPIRIVA once daily, SINGULAIR10, PRED1mg -2tabs  Qam ?per PCP/ ?Rheum... 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    Cardiac> on Coreg6.25- 1/2Bid for ?HBP; she has mild AS & DiastCHF on 2DEcho and this needs follow up w/ her PCP vs Cards...    Endocrine> IDDM on Lantus & Novolog (A1c=6.3), HL on Crestor10 (FLP- controlled), Hypothyroid on Synthroid50, transiently elev serum calcium ?etiology (resolved spont), osteoporosis w/ mult fxs on Prolia Q55mo; managed by DrGherghe & last seen 06/09/15- note reviewed...    GYN> Hx ovarian cancer (in remission- details unknown), s/p hyst & Ca125=9 (08/2014)...    Ortho/ Rheum> DJD w/ TKR, FM on Lyrica,  Tramadol, Percocet,etc, severe LBP requires epidural shots; she is on a huge dose of Lyrica; PMR on Pred (details unknown); her Sed=52 and she will prob need Rheum to help sort this out...    Neuro> on Aricept5 per DrCrawford, I wonder if she is taking any/ all of her meds properly, no care giver accompanies pt to office visits, she is ?stoic ?oblivious...    Heme> Hg 10-11 range May2016 after fall w/ fx nose & wrist; similar now & may need further anemia work-up as well, deferred to PCP... EXAM showed Afeb, VSS, O2sat=98% on RA;  Wt=161#;  Heent- neg, mallampati3;  Chest- decr BS bilat, not congested, w/o w/r/r heard;  Heart- RR gr1/6SEM w/o r/g detected;  Abd- soft, nontender, +panniculus;  Ext- VI w/o c/c/e...   LABS 2016:  Reviewed... IMP/PLAN>>  We again outlined for MrsNatter a vigorous home treatment regimen for her bronchiectasis & mucoid impactions including a TID regimen>  NEB w/ Albut vs Duoneb Tid, followed by her Advair500Bid & Spiriva once/d, Guaifenesin 400mg  tabs- 2Tid w/ fluids, take her low dose Pred in AM & Singulair in PM; she declines to do chest PT/ Flutter & postural drainage treatments...  ~  October 01, 2015:  42mo ROV & add-on appt requested by pt for COPD exac>  She reports that she went to a granddaughter's wedding in [place] 6/24, then spent 1wk in [place] and "caught a cold", didn't go to UC or see a physician, just "toughed it out", getting better slowly but symptoms lingering she says> head is woozy, nasal drainage, sl cough, white sput (no discoloration or blood), sl tight/ wheezy/ congested but no CP/ f/c/s, etc... She reminds me that "ZPak doesn't work for me", and she wants Doxy;  It is apparent that she is still NOT doing her Resp treatment regimen as I have prev requested & outlined for her (see below)- acts as if this is the first time she has heard this from me, she remains on Aricept5 from PCP & has not seen Neuro...we reviewed the following medical problems during today's office  visit >>     Pulmonary> Hx COPD/ Bronchiectasis/ Abn CT Chest w/ mucoid impactions, tracheobronchomalacia w/ air trapping, 10mm LLL nodule, Hx granulomatous lung dis & reported atyp mycobacterium in  the past>  See Hi-res CTChest 12/2014;  We encouraged a regular bronchodilator regimen (NEBS w/ Albut vs Duoneb Tid, ADVAIR500Bid, SPIRIVA daily), Guaifenesin 400mg -2Tid, Singulair10, Fluids, Flutter/ postural drainage/ purse-lip breathing exercises, & check sput for cultures but she could not be bothered & refused further pulm treatment/ testing sput/ etc=> still not cooperating w/ max chest physiotherapy regimen & states she only gets a sm amt of whitish sput (unable to successfully induce sput in office...    Cardiac> on Coreg6.25- 1/2Bid for ?HBP; she has mild AS & DiastCHF on 2DEcho and this needs follow up w/ her PCP vs Cards...    Endocrine> IDDM on Lantus & Novolog (A1c=6.3), HL on Crestor10 (FLP- controlled), Hypothyroid on Synthroid50, transiently elev serum calcium ?etiology (resolved spont), osteoporosis w/ mult fxs on Prolia Q64mo; managed by DrGherghe & last seen 06/09/15- note reviewed...    GYN> Hx ovarian cancer (in remission- details unknown), s/p hyst & Ca125=9 (08/2014)... She is followed by DrFernandez now...    Ortho/ Rheum> DJD w/ TKR, FM on Lyrica, Tramadol, Percocet,etc, severe LBP requires epidural shots; she is on a huge dose of Lyrica; PMR on Pred (details unknown); her Sed=52 and she saw [facility] for Rheum [facility] 09/03/15- severe OA, FM, PMR, & osteoporosis on Lyrica, Pred, Percocet, & Prolia; she felt worse when out of her Lyrica    Neuro> on Aricept5 per DrCrawford, I wonder if she is taking any/ all of her meds properly, no care giver accompanies pt to office visits, she is ?stoic ?oblivious...    Heme> Hg 10-11 range May2016 after fall w/ fx nose & wrist; similar now & may need further anemia work-up as well, deferred to PCP... EXAM showed Afeb, VSS, O2sat=96% on RA;  Wt=167#;  Heent-  neg, mallampati3;  Chest- decr BS bilat, not congested, w/o w/r/r heard;  Heart- RR gr1/6SEM w/o r/g detected;  Abd- soft, nontender, +panniculus;  Ext- VI w/o c/c/e...   CXR 11/04/15 showedCOPD, scarring in right mid lung, chr changes and NAD... IMP/PLAN>>  Berneice has a URI & mild COPD exac=> we will treat w/ Doxy100Bid (her request) and Medrol dosepak;  We again reviewed the recommended treatment protocol for her bronchiectasis (it was like she heard it for the 1st time)>  NEBS Tid, followed by [facility] & SPIRIVA daily, +Guaifenesin400-2Tid + Fluids, take the low dose Pred in AM (per Rheum) and the Singulair10 in PM, work to expectorate the phlegm but she declines to do chest PT, Flutter, postural drainage, etc... we plan ROV recheck in 15mo...  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    GI> prev eval apparently in [place] pt doesn't recall, no records avail, she has Prevacid30 & Zantac150Bid listed on med list but she says just prn... EXAM showed Afeb, VSS, O2sat=97% on RA;  Wt=162#;  Heent- neg, mallampati3;  Chest- decr BS bilat, not congested, w/o w/r/r heard;  Heart- RR gr1/6SEM w/o r/g detected;  Abd- soft, nontender, +panniculus;  Ext- VI w/o c/c/e...  IMP/PLAN>>  MsNatter has serious multisys dis & is managed by DrCrawford-PCP;  From the pulm standpoint she continues to downplay the severity of her lung dis & still refuses/ forgets/ etc to do her breathing treatments as we have outlined for her- REC to use NEB Tid, followed by Advair500Bid, Spiriva once dailyGFN400-2Tid w/ fluids, Singulair10, & Flutter valve/ postural drain/ purse-lip breathig treatments TID... Today we reviewed this regimen & added a Medrol Dosepak... She now appears to have signif GI-GERD/ reflux/ prob LPR & we added a vigorous antireflux regimen=> Prevacid30 taken 30 miin before dinner, NPO after dinner, elev HOB 6" blocks... She needs to see GI for further eval & needs these recommendations reinforced by them & her PCP...  ~  April 13, 2016:  40mo ROV & once again MsNatter returns by herself for a f/u visit> she tells me that she is doing satis, no new complaints or concerns;  When asked about her meds she says they are all the same but she does not know the names or the dosing regimen;  She states her breathing is the same- min SOB w/o change- eg w/ walking (says lim by knee); notes mild cough/ sput- clear w/o color or blood seen, no CP;  Today's agenda involves getting a handicap placard- we filled it out for her... 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 We re-iterate the max regimen at each office visit & print it out on the AVS for family to review & help pt with...    Cardiac> on Coreg6.25- 1/2Bid for ?HBP; she has mild AS & DiastCHF on 2DEcho and this needs follow up w/ her PCP (DrCrawford) vs Cards consult ...    Endocrine> IDDM on Lantus & Novolog (A1c=6.3), HL on Crestor10 (FLP- controlled), Hypothyroid on Synthroid50, transiently elev serum calcium ?etiology (resolved spont), osteoporosis w/ mult fxs on Prolia Q67mo; managed by DrGherghe but last seen 06/09/15- note reviewed; she sees DrCrawford frequently...    GYN> Hx  ovarian cancer (in remission- details unknown), s/p hyst & Ca125=9 (08/2014)... She is followed by DrFernandez now...    Ortho/ Rheum> DJD w/ TKR, FM on Lyrica, Tramadol, Percocet,etc, severe LBP requires epidural shots; she is on a huge dose of Lyrica; PMR on Pred (details unknown); her Sed=52 and she saw [facility] for Rheum 03/23/16- 57mo rov for  severe OA, FM, PMR, & osteoporosis on Lyrica 300Bid, Pred 2mg /d, Percocet, & Prolia (last dose 02/24/16); she felt worse when out of her Lyrica    Neuro & Psyche> on Aricept5 per DrCrawford plus Buspar & Cymbalta- I wonder if she is taking any/ all of her meds properly, no care  giver accompanies pt to office visits, she is ?stoic ?oblivious...    Heme> Hg 10-11 range May2016 after fall w/ fx nose & wrist; similar now & may need further anemia work-up as well, deferred to PCP... EXAM showed Afeb, VSS, O2sat=95% on RA;  Wt=160#;  Heent- neg, mallampati3;  Chest- decr BS bilat, not congested, w/o w/r/r heard;  Heart- RR gr1/6SEM w/o r/g detected;  Abd- soft, nontender, +panniculus;  Ext- VI w/o c/c/e...   Ambulatory Oximetry 04/13/16>  O2sat=92% on RA at rest w/ pulse= 80/min... She ambulated 1 Lap in office (185') w/ O2sat drop to 84% with pulse=89/min... IMP/PLAN>>  Ngozi has exercise induced hypoxemia & needs a POC w/ O2 at 2L/min w/ activity;  We will check an ONO in the interim to see if she needs nocturnal O2 as well;  In the meanwhile we once again reviewe4d w/ the pt regarding her MAX respiratory regimen-- Albut via NEB Tid, followed by Advair500Bid & Spiriva daily; continue GFN 400mg -2Tid w/ fluids; on Pred 2mg /d by rheum for PMR; on Singulair10 & flonase;  We also reviewed her antireflux regimen w/ Prevacid30 taken 30 min before dinner, NPO after dinner, elev HOB 6"... we plan another recheck in 12mo...    Past Medical History:  Diagnosis Date  . Arthritis   . Asthma   . Diabetes mellitus without complication ([place])   . Fibromyalgia   . GERD (gastroesophageal reflux disease)   . History of chemotherapy   . History of fractured pelvis   . Hypertension   . Osteoporosis   . Osteoporosis   . Ovarian cancer [facility])     Past Surgical History  Procedure Laterality Date  . Hip surgey    . Knee surgey    . Lung surgery >> resection of nodule 2000, proved to be a benign granuloma & ?atyp mycobacterium ident at the same time?    . Bladder suspension    . Ankle fracture surgery      plate and screws  . Abdominal hysterectomy  1996    Outpatient Encounter Prescriptions as of 04/13/2016  Medication Sig  . albuterol (PROVENTIL HFA;VENTOLIN HFA) 108 (90 Base) MCG/ACT  inhaler Inhale 2 puffs into the lungs every 6 (six) hours as needed for wheezing or shortness of breath.  Marland Kitchen albuterol (PROVENTIL) (2.5 MG/3ML) 0.083% nebulizer solution Take 3 mLs (2.5 mg total) by nebulization daily.  . busPIRone (BUSPAR) 10 MG tablet Take 1 tablet (10 mg total) by mouth 2 (two) times daily.  . carvedilol (COREG) 6.25 MG tablet Take 3.125 mg by mouth 2 (two) times daily with a meal.   . denosumab (PROLIA) 60 MG/ML SOLN injection Inject 60 mg into the skin every 6 (six) months. Administer in upper arm, thigh, or abdomen  . Dexlansoprazole 30 MG capsule Take 1 capsule (30 mg total) by  mouth daily.  Marland Kitchen Dextromethorphan-Guaifenesin (MUCINEX DM MAXIMUM STRENGTH) 60-1200 MG TB12 Take 1 tablet by mouth 2 (two) times daily as needed (for cough/phlegm). Reported on 06/23/2015  . donepezil (ARICEPT) 5 MG tablet TAKE 1 TABLET (5 MG TOTAL) BY MOUTH AT BEDTIME.  . DULoxetine (CYMBALTA) 60 MG capsule Take 60 mg by mouth daily.  . fluticasone (FLONASE) 50 MCG/ACT nasal spray Place 1-2 sprays into both nostrils daily as needed for allergies or rhinitis.  . Fluticasone-Salmeterol (ADVAIR DISKUS) 500-50 MCG/DOSE AEPB Inhale 1 puff into the lungs 2 (two) times daily.  Marland Kitchen glucose blood (FREESTYLE TEST STRIPS) test strip Use to test blood sugar 3 times daily. Dx: E11.65  . HYDROcodone-homatropine (HYCODAN) 5-1.5 MG/5ML syrup Take 5 mLs by mouth every 6 (six) hours as needed for cough.  . insulin lispro (HUMALOG KWIKPEN) 100 UNIT/ML KiwkPen Inject 4-6 units into the skin daily.  . Insulin Pen Needle 32G X 4 MM MISC Use 4x a day  . Lancets (FREESTYLE) lancets Use to test blood sugar 3 times daily. Dx: E11.65  . LANTUS SOLOSTAR 100 UNIT/ML Solostar Pen INJECT 30 UNITS SUBCUTANEOUSLY EVERY MORNING  . levothyroxine (SYNTHROID, LEVOTHROID) 50 MCG tablet Take 1 tablet (50 mcg total) by mouth daily before breakfast.  . methylPREDNISolone (MEDROL) 4 MG TBPK tablet Take as directed  . montelukast (SINGULAIR) 10 MG  tablet TAKE 1 TABLET (10 MG TOTAL) BY MOUTH DAILY.  . NON FORMULARY 1 Syringe by Epidural route every 3 (three) months. Epidural Steroid Injections for Lumbar Spinal Stenosis up to 4 times a year  . oxyCODONE-acetaminophen (PERCOCET/ROXICET) 5-325 MG tablet Take 1 tablet by mouth 2 (two) times daily as needed for severe pain.  . predniSONE (DELTASONE) 1 MG tablet Take 2 mg by mouth daily with breakfast.   . pregabalin (LYRICA) 300 MG capsule Take 1 capsule (300 mg total) by mouth 2 (two) times daily.  . ranitidine (ZANTAC) 150 MG tablet Take 150 mg by mouth 2 (two) times daily as needed for heartburn.  . rosuvastatin (CRESTOR) 10 MG tablet Take 1 tablet (10 mg total) by mouth every evening.  . tiotropium (SPIRIVA HANDIHALER) 18 MCG inhalation capsule Place 1 capsule (18 mcg total) into inhaler and inhale daily.  Marland Kitchen zolpidem (AMBIEN) 5 MG tablet Take 1 tablet (5 mg total) by mouth at bedtime.  . [DISCONTINUED] montelukast (SINGULAIR) 10 MG tablet TAKE 1 TABLET (10 MG TOTAL) BY MOUTH DAILY.   No facility-administered encounter medications on file as of 04/13/2016.     Allergies  Allergen Reactions  . Latex Rash  . Penicillins Rash    Current Medications, Allergies, Past Medical History, Past Surgical History, Family History, and Social History were reviewed in [employer] record.   Review of Systems             All symptoms NEG except where BOLDED >>  Constitutional:  F/C/S, fatigue, anorexia, unexpected weight change. HEENT:  HA, visual changes, hearing loss, earache, nasal symptoms, sore throat, mouth sores, hoarseness. Resp:  cough, sputum, hemoptysis; SOB, tightness, wheezing. Cardio:  CP, palpit, DOE, orthopnea, edema. GI:  N/V/D/C, blood in stool; reflux, abd pain, distention, gas. GU:  dysuria, freq, urgency, hematuria, flank pain, voiding difficulty. MS:  joint pain, swelling, tenderness, decr ROM; neck pain, back pain, etc. Neuro:  HA, tremors, seizures,  dizziness, syncope, weakness, numbness, gait abn. Skin:  suspicious lesions or skin rash. Heme:  adenopathy, bruising, bleeding. Psyche:  confusion, agitation, sleep disturbance, hallucinations, anxiety, depression suicidal.  Objective:   Physical Exam       Vital Signs:  Reviewed...   General:  WD, WN, 81 y/o WF in NAD; alert & oriented; pleasant & cooperative... HEENT:  [place]/AT; Conjunctiva- pink, Sclera- nonicteric, EOM-wnl, PERRLA, EACs-clear, TMs-wnl; NOSE-clear; THROAT-clear & wnl.  Neck:  Supple w/ fairROM; no JVD; normal carotid impulses w/o bruits; no thyromegaly or nodules palpated; no lymphadenopathy.  Chest:  Decr BS bilat, few scat rhonchi otherw clear not congested no wheezing rales or signs of consolidation... Heart:  Regular Rhythm; norm S1 & S2 w/ Gr1/6 SEM w/o rubs or gallops detected... Abdomen:  Soft & nontender, +panniculus- no guarding or rebound; normal bowel sounds; no organomegaly or masses palpated. Ext:  decrROM; without deformities +arthritic changes; +venous insuffic, no edema;  Pulses intact w/o bruits. Neuro:  No focal neuro deficit; abn gait & balance poor, uses cane... Derm:  No lesions noted; no rash etc. Lymph:  No cervical, supraclavicular, axillary, or inguinal adenopathy palpated.   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    Cardiac> on Coreg6.25- 1/2Bid for ?HBP; she has mild AS & DiastCHF on 2DEcho and this needs follow up w/ her PCP vs Cards...    Endocrine> IDDM on Lantus & Novolog (A1c=6.3), HL on Crestor10 (FLP- controlled), Hypothyroid on Synthroid50, transiently elev serum calcium ?etiology (resolved spont), osteoporosis w/ mult fxs on Prolia Q22mo; managed by DrGherghe & last seen 06/09/15- note reviewed...    GI> She presented w/ symptoms of reflux/ LPR w/ cough while eating etc => needs GI eval & we will refer (see recs below)...    GYN> Hx ovarian cancer (in remission- details unknown), s/p hyst & Ca125=9 (08/2014)... She is followed by DrFernandez now...    Ortho/ Rheum> DJD w/ TKR, FM on Lyrica, Tramadol, Percocet,etc, severe LBP requires epidural shots; she is on a huge dose of Lyrica; PMR on Pred (details unknown); her Sed=52 and she saw [facility] for Rheum last 09/03/15- severe OA, FM, PMR, & osteoporosis on Lyrica, Pred, Percocet, & Prolia; she felt worse when out of her Lyrica    Neuro> on Aricept5 per DrCrawford, I wonder if she is taking any/ all of her meds properly, no care giver accompanies pt to office visits, she is ?stoic ?oblivious...    Heme> Hg 10-11 range May2016 after fall w/ fx nose & wrist; similar now & may need further anemia work-up as well, deferred to PCP...  PLAN >>   01/19/15>   We discussed Rx w/ ZPak for her URI (it could also help her if she has atyp-mycobacterium); we compromised rx w/ NEBS TID- followed by AdvairBid & Spiriva once daily, plus the MUCINEX1200mg Bid w/ fluids;  She again declines the added benefit from a vigorous chest PT regimen, flutter valve, postural drainage, purse lip breathing, etc... 06/23/15>   We again outlined for  MrsNatter a vigorous home treatment regimen for her bronchiectasis & mucoid impactions including a TID regimen>  NEB w/ Albut vs Duoneb Tid, followed by her Advair500Bid & Spiriva  once/d, Guaifenesin 400mg  tabs- 2Tid w/ fluids, take her low dose Pred in AM & Singulair in PM; she declines to do chest PT/ Flutter & postural drainage treatments... 10/01/15>   Karalena has a URI & mild COPD exac=> we will treat w/ Doxy100Bid (her request) and Medrol dosepak;  We again reviewed the recommended treatment protocol for her bronchiectasis (it was like she heard it for the 1st time)>  NEBS Tid, followed by [employer] & SPIRIVA daily, +Guaifenesin400-2Tid + Fluids, take the low dose Pred in AM (per Rheum) and the Singulair10 in PM, work to expectorate the phlegm but she declines to do chest PT, Flutter, postural drainage, etc... we plan ROV recheck in 15mo... 12/25/15>   MsNatter has serious multisys dis & is managed by DrCrawford-PCP;  From the pulm standpoint she continues to downplay the severity of her lung dis & still refuses/ forgets/ etc to do her breathing treatments as we have outlined for her- REC to use NEB Tid, followed by Advair500Bid, Spiriva once dailyGFN400-2Tid w/ fluids, Singulair10, & Flutter valve/ postural drain/ purse-lip breathig treatments TID... Today we reviewed this regimen & added a Medrol Dosepak... She now appears to have signif GI-GERD/ reflux/ prob LPR & we added a vigorous antireflux regimen=> Prevacid30 taken 30 miin before dinner, NPO after dinner, elev HOB 6" blocks... She needs to see GI for further eval & needs these recommendations reinforced by them & her PCP. 04/13/16>   Cecily has exercise induced hypoxemia & needs a POC w/ O2 at 2L/min w/ activity;  We will check an ONO in the interim to see if she needs nocturnal O2 as well;  In the meanwhile we once again reviewe4d w/ the pt regarding her MAX respiratory regimen-- Albut via NEB Tid, followed by Advair500Bid & Spiriva daily; continue GFN 400mg -2Tid w/ fluids; on Pred 2mg /d by rheum for PMR; on Singulair10 & flonase;  We also reviewed her antireflux regimen w/ Prevacid30 taken 30 min before dinner, NPO after  dinner, elev HOB 6"     Plan:     Patient's Medications  New Prescriptions   No medications on file  Previous Medications   ALBUTEROL (PROVENTIL HFA;VENTOLIN HFA) 108 (90 BASE) MCG/ACT INHALER    Inhale 2 puffs into the lungs every 6 (six) hours as needed for wheezing or shortness of breath.   ALBUTEROL (PROVENTIL) (2.5 MG/3ML) 0.083% NEBULIZER SOLUTION    Take 3 mLs (2.5 mg total) by nebulization daily.   BUSPIRONE (BUSPAR) 10 MG TABLET    Take 1 tablet (10 mg total) by mouth 2 (two) times daily.   CARVEDILOL (COREG) 6.25 MG TABLET    Take 3.125 mg by mouth 2 (two) times daily with a meal.    DENOSUMAB (PROLIA) 60 MG/ML SOLN INJECTION    Inject 60 mg into the skin every 6 (six) months. Administer in upper arm, thigh, or abdomen   DEXLANSOPRAZOLE 30 MG CAPSULE    Take 1 capsule (30 mg total) by mouth daily.   DEXTROMETHORPHAN-GUAIFENESIN (MUCINEX DM MAXIMUM STRENGTH) 60-1200 MG TB12    Take 1 tablet by mouth 2 (two) times daily as needed (for cough/phlegm). Reported on 06/23/2015   DONEPEZIL (ARICEPT) 5 MG TABLET    TAKE 1 TABLET (5 MG TOTAL) BY MOUTH AT BEDTIME.   DULOXETINE (CYMBALTA) 60 MG CAPSULE    Take  60 mg by mouth daily.   FLUTICASONE (FLONASE) 50 MCG/ACT NASAL SPRAY    Place 1-2 sprays into both nostrils daily as needed for allergies or rhinitis.   FLUTICASONE-SALMETEROL (ADVAIR DISKUS) 500-50 MCG/DOSE AEPB    Inhale 1 puff into the lungs 2 (two) times daily.   GLUCOSE BLOOD (FREESTYLE TEST STRIPS) TEST STRIP    Use to test blood sugar 3 times daily. Dx: E11.65   HYDROCODONE-HOMATROPINE (HYCODAN) 5-1.5 MG/5ML SYRUP    Take 5 mLs by mouth every 6 (six) hours as needed for cough.   INSULIN LISPRO (HUMALOG KWIKPEN) 100 UNIT/ML KIWKPEN    Inject 4-6 units into the skin daily.   INSULIN PEN NEEDLE 32G X 4 MM MISC    Use 4x a day   LANCETS (FREESTYLE) LANCETS    Use to test blood sugar 3 times daily. Dx: E11.65   LANTUS SOLOSTAR 100 UNIT/ML SOLOSTAR PEN    INJECT 30 UNITS SUBCUTANEOUSLY  EVERY MORNING   LEVOTHYROXINE (SYNTHROID, LEVOTHROID) 50 MCG TABLET    Take 1 tablet (50 mcg total) by mouth daily before breakfast.   METHYLPREDNISOLONE (MEDROL) 4 MG TBPK TABLET    Take as directed   NON FORMULARY    1 Syringe by Epidural route every 3 (three) months. Epidural Steroid Injections for Lumbar Spinal Stenosis up to 4 times a year   OXYCODONE-ACETAMINOPHEN (PERCOCET/ROXICET) 5-325 MG TABLET    Take 1 tablet by mouth 2 (two) times daily as needed for severe pain.   PREDNISONE (DELTASONE) 1 MG TABLET    Take 2 mg by mouth daily with breakfast.    PREGABALIN (LYRICA) 300 MG CAPSULE    Take 1 capsule (300 mg total) by mouth 2 (two) times daily.   RANITIDINE (ZANTAC) 150 MG TABLET    Take 150 mg by mouth 2 (two) times daily as needed for heartburn.   ROSUVASTATIN (CRESTOR) 10 MG TABLET    Take 1 tablet (10 mg total) by mouth every evening.   TIOTROPIUM (SPIRIVA HANDIHALER) 18 MCG INHALATION CAPSULE    Place 1 capsule (18 mcg total) into inhaler and inhale daily.   ZOLPIDEM (AMBIEN) 5 MG TABLET    Take 1 tablet (5 mg total) by mouth at bedtime.  Modified Medications   Modified Medication Previous Medication   MONTELUKAST (SINGULAIR) 10 MG TABLET montelukast (SINGULAIR) 10 MG tablet      TAKE 1 TABLET (10 MG TOTAL) BY MOUTH DAILY.    TAKE 1 TABLET (10 MG TOTAL) BY MOUTH DAILY.  Discontinued Medications   No medications on file

## 2016-04-16 NOTE — Telephone Encounter (Signed)
SN  Please Advise-  I spoke with pt. And she states her oxygen tank makes too much noise and causes her migraines so she no longer wants it. She has stopped using it last night, and feels like she is doing fine.

## 2016-04-19 ENCOUNTER — Telehealth: Payer: Self-pay | Admitting: Internal Medicine

## 2016-04-19 DIAGNOSIS — R32 Unspecified urinary incontinence: Secondary | ICD-10-CM

## 2016-04-19 NOTE — Telephone Encounter (Signed)
Referral placed although I do not know how many female providers they have and new patient availability but they will try.

## 2016-04-19 NOTE — Telephone Encounter (Signed)
Pt called requesting referral for incontinence to [facility], she wants a female doctor. Please advise MS

## 2016-04-19 NOTE — Telephone Encounter (Signed)
Per SN: call [facility] to see if there is anyway they can fix her current machine or find her one that is not as loud.  If this still doesn't make her happy, okay to pick up her O2 noting "patient refuses oxygen."    Order placed to [employer] spoke with patient to inform her of the above.  Pt voiced her understanding and stated that instead of [facility] coming to check her tanks, she would just like them to go ahead and pick up all her O2.  Since SN already authorized this, will adjust order.

## 2016-04-20 NOTE — Telephone Encounter (Signed)
Order placed. Nothing further needed. 

## 2016-04-21 DIAGNOSIS — M1712 Unilateral primary osteoarthritis, left knee: Secondary | ICD-10-CM | POA: Diagnosis not present

## 2016-04-21 DIAGNOSIS — M25562 Pain in left knee: Secondary | ICD-10-CM | POA: Diagnosis not present

## 2016-04-21 DIAGNOSIS — M25512 Pain in left shoulder: Secondary | ICD-10-CM | POA: Diagnosis not present

## 2016-04-21 DIAGNOSIS — M19012 Primary osteoarthritis, left shoulder: Secondary | ICD-10-CM | POA: Diagnosis not present

## 2016-04-26 ENCOUNTER — Other Ambulatory Visit: Payer: Self-pay | Admitting: Internal Medicine

## 2016-04-26 MED ORDER — OSELTAMIVIR PHOSPHATE 75 MG PO CAPS: 75.0000 mg | ORAL_CAPSULE | Freq: Two times a day (BID) | ORAL | 0 refills | Status: DC

## 2016-04-27 ENCOUNTER — Other Ambulatory Visit: Payer: Self-pay | Admitting: Internal Medicine

## 2016-04-28 DIAGNOSIS — F331 Major depressive disorder, recurrent, moderate: Secondary | ICD-10-CM | POA: Diagnosis not present

## 2016-04-28 DIAGNOSIS — F419 Anxiety disorder, unspecified: Secondary | ICD-10-CM | POA: Diagnosis not present

## 2016-05-10 ENCOUNTER — Telehealth: Payer: Self-pay | Admitting: Pulmonary Disease

## 2016-05-10 NOTE — Telephone Encounter (Signed)
[employer] parking permit forms signed by SN and given back to pt. Nothing further needed.

## 2016-05-19 ENCOUNTER — Telehealth: Payer: Self-pay | Admitting: Pulmonary Disease

## 2016-05-19 DIAGNOSIS — M25512 Pain in left shoulder: Secondary | ICD-10-CM | POA: Diagnosis not present

## 2016-05-19 DIAGNOSIS — M19012 Primary osteoarthritis, left shoulder: Secondary | ICD-10-CM | POA: Diagnosis not present

## 2016-05-19 NOTE — Telephone Encounter (Signed)
Form filled out to best of my ability and placed on SN's cart for signature. Will forward to [facility] for follow up.

## 2016-05-20 ENCOUNTER — Telehealth: Payer: Self-pay | Admitting: *Deleted

## 2016-05-20 NOTE — Telephone Encounter (Signed)
Pt called requesting order for 3D mammogram screening at [facility], I told pt no order needed to call and schedule.

## 2016-05-21 ENCOUNTER — Other Ambulatory Visit: Payer: Self-pay | Admitting: Gynecology

## 2016-05-21 DIAGNOSIS — Z1231 Encounter for screening mammogram for malignant neoplasm of breast: Secondary | ICD-10-CM

## 2016-05-25 DIAGNOSIS — M25512 Pain in left shoulder: Secondary | ICD-10-CM | POA: Diagnosis not present

## 2016-05-25 DIAGNOSIS — M19012 Primary osteoarthritis, left shoulder: Secondary | ICD-10-CM | POA: Diagnosis not present

## 2016-05-25 DIAGNOSIS — F419 Anxiety disorder, unspecified: Secondary | ICD-10-CM | POA: Diagnosis not present

## 2016-05-25 DIAGNOSIS — F331 Major depressive disorder, recurrent, moderate: Secondary | ICD-10-CM | POA: Diagnosis not present

## 2016-05-25 NOTE — Telephone Encounter (Signed)
Leigh please advise if this form has been completed. Thanks.

## 2016-05-26 NOTE — Telephone Encounter (Signed)
Spoke with pt, aware that form is filled out.  Pt requesting we mail this back to her-verified address on file.  Form placed in outgoing mail.  Nothing further needed.

## 2016-05-26 NOTE — Telephone Encounter (Signed)
Patient is calling to check on form, CB is [phone].

## 2016-05-27 DIAGNOSIS — M25512 Pain in left shoulder: Secondary | ICD-10-CM | POA: Diagnosis not present

## 2016-05-27 DIAGNOSIS — M19012 Primary osteoarthritis, left shoulder: Secondary | ICD-10-CM | POA: Diagnosis not present

## 2016-06-01 DIAGNOSIS — M25512 Pain in left shoulder: Secondary | ICD-10-CM | POA: Diagnosis not present

## 2016-06-01 DIAGNOSIS — M19012 Primary osteoarthritis, left shoulder: Secondary | ICD-10-CM | POA: Diagnosis not present

## 2016-06-03 DIAGNOSIS — M25512 Pain in left shoulder: Secondary | ICD-10-CM | POA: Diagnosis not present

## 2016-06-03 DIAGNOSIS — M19012 Primary osteoarthritis, left shoulder: Secondary | ICD-10-CM | POA: Diagnosis not present

## 2016-06-08 DIAGNOSIS — M25512 Pain in left shoulder: Secondary | ICD-10-CM | POA: Diagnosis not present

## 2016-06-08 DIAGNOSIS — M19012 Primary osteoarthritis, left shoulder: Secondary | ICD-10-CM | POA: Diagnosis not present

## 2016-06-09 ENCOUNTER — Ambulatory Visit: Payer: Medicare Other

## 2016-06-10 DIAGNOSIS — M19012 Primary osteoarthritis, left shoulder: Secondary | ICD-10-CM | POA: Diagnosis not present

## 2016-06-10 DIAGNOSIS — M25512 Pain in left shoulder: Secondary | ICD-10-CM | POA: Diagnosis not present

## 2016-06-12 ENCOUNTER — Other Ambulatory Visit: Payer: Self-pay | Admitting: Internal Medicine

## 2016-06-14 DIAGNOSIS — M7052 Other bursitis of knee, left knee: Secondary | ICD-10-CM | POA: Diagnosis not present

## 2016-06-14 DIAGNOSIS — M1712 Unilateral primary osteoarthritis, left knee: Secondary | ICD-10-CM | POA: Diagnosis not present

## 2016-06-15 DIAGNOSIS — F419 Anxiety disorder, unspecified: Secondary | ICD-10-CM | POA: Diagnosis not present

## 2016-06-15 DIAGNOSIS — F331 Major depressive disorder, recurrent, moderate: Secondary | ICD-10-CM | POA: Diagnosis not present

## 2016-06-17 DIAGNOSIS — F331 Major depressive disorder, recurrent, moderate: Secondary | ICD-10-CM | POA: Diagnosis not present

## 2016-06-29 ENCOUNTER — Ambulatory Visit: Payer: Medicare Other

## 2016-06-29 DIAGNOSIS — N3941 Urge incontinence: Secondary | ICD-10-CM | POA: Diagnosis not present

## 2016-06-29 DIAGNOSIS — R350 Frequency of micturition: Secondary | ICD-10-CM | POA: Diagnosis not present

## 2016-06-29 DIAGNOSIS — R351 Nocturia: Secondary | ICD-10-CM | POA: Diagnosis not present

## 2016-07-06 DIAGNOSIS — F419 Anxiety disorder, unspecified: Secondary | ICD-10-CM | POA: Diagnosis not present

## 2016-07-06 DIAGNOSIS — F331 Major depressive disorder, recurrent, moderate: Secondary | ICD-10-CM | POA: Diagnosis not present

## 2016-07-07 ENCOUNTER — Ambulatory Visit
Admission: RE | Admit: 2016-07-07 | Discharge: 2016-07-07 | Disposition: A | Discharge status: Home / Self Care | Payer: Medicare Other | Source: Ambulatory Visit | Attending: Gynecology | Admitting: Gynecology

## 2016-07-07 DIAGNOSIS — Z1231 Encounter for screening mammogram for malignant neoplasm of breast: Secondary | ICD-10-CM | POA: Diagnosis not present

## 2016-07-12 ENCOUNTER — Ambulatory Visit (INDEPENDENT_AMBULATORY_CARE_PROVIDER_SITE_OTHER): Payer: Medicare Other | Admitting: Pulmonary Disease

## 2016-07-12 ENCOUNTER — Encounter: Payer: Self-pay | Admitting: Pulmonary Disease

## 2016-07-12 VITALS — BP 100/66 | HR 74 | Temp 97.8°F | Ht 60.0 in | Wt 151.0 lb

## 2016-07-12 DIAGNOSIS — M8949 Other hypertrophic osteoarthropathy, multiple sites: Secondary | ICD-10-CM

## 2016-07-12 DIAGNOSIS — K219 Gastro-esophageal reflux disease without esophagitis: Secondary | ICD-10-CM

## 2016-07-12 DIAGNOSIS — M159 Polyosteoarthritis, unspecified: Secondary | ICD-10-CM

## 2016-07-12 DIAGNOSIS — E1165 Type 2 diabetes mellitus with hyperglycemia: Secondary | ICD-10-CM

## 2016-07-12 DIAGNOSIS — R0902 Hypoxemia: Secondary | ICD-10-CM | POA: Diagnosis not present

## 2016-07-12 DIAGNOSIS — Z794 Long term (current) use of insulin: Secondary | ICD-10-CM

## 2016-07-12 DIAGNOSIS — M353 Polymyalgia rheumatica: Secondary | ICD-10-CM | POA: Diagnosis not present

## 2016-07-12 DIAGNOSIS — I10 Essential (primary) hypertension: Secondary | ICD-10-CM | POA: Diagnosis not present

## 2016-07-12 DIAGNOSIS — M150 Primary generalized (osteo)arthritis: Secondary | ICD-10-CM

## 2016-07-12 DIAGNOSIS — F411 Generalized anxiety disorder: Secondary | ICD-10-CM

## 2016-07-12 DIAGNOSIS — E039 Hypothyroidism, unspecified: Secondary | ICD-10-CM | POA: Diagnosis not present

## 2016-07-12 DIAGNOSIS — J479 Bronchiectasis, uncomplicated: Secondary | ICD-10-CM | POA: Diagnosis not present

## 2016-07-12 DIAGNOSIS — I5032 Chronic diastolic (congestive) heart failure: Secondary | ICD-10-CM | POA: Diagnosis not present

## 2016-07-12 NOTE — Patient Instructions (Signed)
Today we updated your med list in our EPIC system...    Continue your current medications the same...  Continue the NEBULIZER 3 times daily followed by the ADVAIR twice daily & the [facility] once daily...  Today we rechecked your OXYGEN w/ walking, and we will set up an OVERNIGHT OXIMETRY test to check your oxygen at night -- this way we can prescribe the right amt of oxygen in each situation...   For your EYE needs I recommend >> they have specialists for all your needs... [address] [place], [place] [address] [phone]  Call for any questions...  Let's plan a follow up visit in 5mo, sooner if needed for problems.Marland KitchenMarland Kitchen

## 2016-07-12 NOTE — Progress Notes (Signed)
Subjective:     Patient ID: Tawona Filsinger, female   DOB: [birth year], 81 y.o.   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  ~  January 07, 2015:  Initial pulmonary consult w/ SN>      77 y/o WF, referred by [name]) for a pulmonary evaluation due to cough & dyspnea>  Cesia moved to [place] about 22mo ago to be near her daughter, granddaughter, and great-grands after her husb passed away;  Shortly after arrival she fell & was [facility] w/ fx nasal bones and left wrist (distal radius & ulna) managed by Burnett Sheng summary reviewed... She established Primary Care w/ DrKollar, [place], Endocrine- DrGherghe... She has a Hx of pulmonary problems identified by the Pt & records from [facility] as Bronchiectasis and Asthma;  Her CC is SOB/ DOE w/ min activity & it is apparent that she is rather sedentary and a poor historian; she notes mild cough, some yellow sputum, no hemoptysis; she denies CP, palpit, or signif cardiac history...  Smoking Hx>  She is a never smoker, but had signif 2nd hand exposure from her husb...  Pulmonary Hx>  She indicates hx left lung surg 2000 for benign granuloma; hx bronchiectasis but she does not know how the Dx was established; told asthma as well ever since early 2000's but not as a child or young adult; states she had pneumonia age 66 at same time as the measles (not hosp & made full recovery w/o apparent residual resp problems); she does admit to recurrent bronchitic infections as adult requiring antibiotics & occas Pred rx... She adds a rambling hx of work-ups at [facility] (in [place] in [place] but she does not know what this was for?       Notes from Dr. Mick Sell in [place]> Bronchiectasis, mult pulm nodules, & mild Asthma; prev pulm nodule resected in 2000 which proved to be a granuloma; he reported that an atypical  mycobacterium was found at the time of lung bx in the past- no further details provided; seen for cough w/ yellow sput & incr dyspnea- reported to be stable on Advair500Bid, Spiriva daily, Singulair10, NEB w/ Albut-Bid, Mucinex, VentolinHFA rescue prn; she was also on Pred 3mg /d for PMR from her Rheum;   Medical Hx>  Diastolic Dysfunction on 2DEcho, mild AS, HL, IDDM, Hypothyroid, GERD, DJD w/ TKR and ankle surg; Fibromyalgia on Lyrica/ Percocet/ Tramadol; PMR per Rheum in [place] on Pred2mg /d x 61yr (it was last dropped from 3mg  ~27yr ago); Osteoporosis; Anemia; Anxiety.   Family Hx>  Mother died from lung cancer (as did one of her sisters);  Father passed from heart disease.  Occup Hx>  No known occupational exposures  Current Meds>  SEE FULL MED LIST BELOW EXAM showed Afeb, VSS, O2sat=99% on RA;  Heent- neg, mallampati3;  Chest- decr BS bilat, not congested, w/o w/r/r heard;  Heart- RR gr1/6SEM w/o r/g detected;  Abd- soft, nontender, +panniculus;  Ext- VI w/o c/c/e...   CT Chest 11/17/09 in [place]> mult noncalcif pulm nodules bilat (stable when compared to older scans), mucoid impaction LUL & RML, pleuroparenchymal scarring right base (unchanged), no adenopathy, norm heart size & coronary calcif.  PFTs 02/04/10 in [place]> FVC=2.20 (95%), FEV1=1.42 (88%), %1sec=65, mid-flows reduced at 38% predicted; no improvement in FEV1 after bronchodil; TLC=4.70 (118%), RV=2.48 (153%), RV/TLC=53%; DLCO=54% predicted.  CXR 01/14/14 reported to show norm heart  size, clear lungs, thoracic spondylosis, NAD...   CXR 08/02/14 in ER here after fall showed norm heart size, diffuse interstitial coarsening w/o focal airsp dis...   2DEcho 08/02/14 in [place] after fall showed norm LVF w/ EF=55-60%, no regional wall motion abn, Gr1 DD, mild AS w/ mean gradient 48mmHg; mod calcif MV leaflets w/o stenosis or regurg; RV looked OK but could not est PAsys...  CXR here 01/07/15 showed norm heart size, incr AP diameter of chest & flat  diaphragms, incr markings diffusely, old right rib fxs, osteopenia...   Spirometry 01/07/15> we attempted mult trial of simple spirometry but pt could not generate an acceptable curve & results are not reliable...  Ambulatory oxygen saturation test 01/07/15> O2sat=98% on RA at rest w/ pulse=79/min; she ambulated 2 laps in office & stopped w/ dyspnea, lowest O2sat=87% w/ pulse=95/min  LABS 01/07/15>  Chems- wnl x BS=45;  CBC- Hg=11.0, MCV=87, WBC=16.3, diff ok;  Sed=52 IMP/PLAN>>  Complex 81 y/o woman:    Pulmonary> Hx COPD/ Bronchiectasis, Hx granulomatous lung dis & reported atyp mycobacterium in the past, ?other ILD- awaiting Hi-res CT Chest results; she has clinical hx asthma in the past but no reversibility on prev PFTs; REC to continue Advair500, Spiriva, NEBS, Singulair, Mucinex, Ventolin HFA for now; may need further ILD work-up w/ collagen-vasc screen QuantGold, sputum studies, etc... We will follow closely.    Cardiac> on Coreg6.25Bid for ?HBP; she has mild AS & DiastCHF on 2DEcho and this needs follow up...    Endocrine> IDDM, Hypothyroid, transiently elev serum calcium ?etiology (resolved spont); managed by DrGherghe; BS today is 45 & asked to eat & check w/ endocrine re- adjustment in Novolog...    GYN> Hx ovarian cancer (in remission- details unknown), s/p hyst    Ortho/ Rheum> DJD w/ TKR, FM on Lyrica etc, severe LBP requires epidural shots; PMR on Pred (details unknown); her Sed=52 and she will prob need Rheum to help sort this out...    Heme> Hg 10-11 range May2016 after fall w/ fx nose & wrist; similar now & may need further anemia work-up as well...  ADDENDUM>>  Hi-res CT Chest 01/14/15:  Norm heart size, atherosclerosis of Ao/ great vessels/ coronaries, no adenopathy, atrophic thyroid; ?small sliding pulm hernia betw the lat 7th & 8th ribs w/ scarring; extensive bronchiectasis bilat w/ mucoid impactions within dilated airways (infectious/inflamm bronchiolitis- r/o MAI), 1mm LLL  nodule, tracheobronchomalacia w/ extensive bilat air trapping on expiration, smallHH, old healed right 6th-7th rib fx, DJD Tspine...  REC> continue NEBS, Advair, Spiriva, Mucinex (maximize to 600mg  Qid w/ fluids), practice purse lip breathing to splint the airways open & aide in expectoration of phlegm, use FLUTTER valve & postural drainage to get the sput out; collect sput for routine C&S, AFB, Fungi => TC w/ pt: she has a lot going on, refuses further meds, refuses further eval, can't be bothered w/ learning Chest PT/ Flutter, postural drainage, purse lip breathing, collecting sputum specimen, etc; states she's had all this a long time & not interested in more aggressive pulmonary treatments at this time... She will f/u w/ me in November & we will discuss again.  ADDENDUM>>  Records from Dr. Mick Sell, Pulmonary in [place]=> note of 07/18/13:  Hx mild asthma & bronchiectasis, hx mult pulm nodules followed for several yrs & one removed surgically in 2000 that proved to be a granuloma & report of an atypical mycobacterium being found at the same time (but no details given), on NEB w/ Albuterol,  Advair, Spiriva, Singulair, Mucinex, low-dose Pred for PMR;  She was given all indicated vaccinations;  No change in her med regimen for yrs...   ~  February 19, 2015:  6wk ROV w/ SN>  SEE ABOVE-- pt notes nasal congestion, chest congestion, sl cough & incr SOB for the last wk or so; in the next breath she says "I seldom get a cold", thinks it's all allergies, notes great-grnads recently ill;  She has NEBS w/ Albut (only using once per day), Advair500Bid, Spiriva once daily, Mucinex (?how much), Pred?1mg - 3/d, Singulair10/ Zyrtek10...  Since she was here last she has followed up w/ PCP-DrCrawford 10/25> FM doing ok on Lyrica & she reported more functional & resting better...   She also had a f/u w/ Endocrine-DrGherghe 11/8> DM, osteoporosis, hypothyroid, & Hx hyperpara; Labs reviewed & ok (A1c=6.5 on her insulin,  Ca is wnl and vitD=66)...    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     Cardiac> on Coreg6.25Bid for ?HBP; she has mild AS & DiastCHF on 2DEcho and this needs follow up w/ her PCP vs Cards...    Endocrine> IDDM, Hypothyroid, transiently elev serum calcium ?etiology (resolved spont); managed by DrGherghe...    GYN> Hx ovarian cancer (in remission- details unknown), s/p hyst & Ca125=9 (08/2014)...    Ortho/ Rheum> DJD w/ TKR, FM on Lyrica, Tramadol, Percocet,etc, severe LBP requires epidural shots; PMR on Pred (details unknown); her Sed=52 and she will prob need Rheum to help sort this out...    Heme> Hg 10-11 range May2016 after fall w/ fx nose & wrist; similar now & may need further anemia work-up as well... EXAM showed Afeb, VSS, O2sat=93% on RA;  Wt=162#;  Heent- neg, mallampati3;  Chest- decr BS bilat, not congested, w/o w/r/r heard;  Heart- RR gr1/6SEM w/o r/g detected;  Abd- soft, nontender, +panniculus;  Ext- VI w/o c/c/e...   Pt given a neb treatment in office- unable to produce any sput for cultures... IMP/PLAN>>  We discussed Rx w/ ZPak for her URI (it could also help her if she has atyp-mycobacterium); we compromised rx w/ NEBS TID- followed by AdvairBid & Spiriva once daily, plus the MUCINEX1200mg Bid w/ fluids;  She again declines the added benefit from a vigorous chest PT regimen, flutter valve, postural drainage, purse lip breathing, etc... we plan a recheck in 3 months...  ~  June 23, 2015:  75mo ROV  w/ SN>  Alonna returns unaccompanied and states that her breathing is good- no problem she says; notes min cough, sm amt whitish sput (no color, no blood), admits to mild SOB/DOE but swears there is no progression, and denies CP/ f/c/s/ etc... She states that she was INTOL to ZPak given last OV but can't recall the reaction ("?didn't work for me"), doesn't want it again;  We reviewed her meds & again she is not being aggressive enough to effectively treat her bronchiectasis & mucoid impactions> eg. Only using the Albut NEBS prn & ave once Qod, only using the Mucinex 1200 once daily, she says she is using the ADVAIR500Bid, SPIRIVA once daily, SINGULAIR10, PRED1mg -2tabs  Qam ?per PCP/ ?Rheum... 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    Cardiac> on Coreg6.25- 1/2Bid for ?HBP; she has mild AS & DiastCHF on 2DEcho and this needs follow up w/ her PCP vs Cards...    Endocrine> IDDM on Lantus & Novolog (A1c=6.3), HL on Crestor10 (FLP- controlled), Hypothyroid on Synthroid50, transiently elev serum calcium ?etiology (resolved spont), osteoporosis w/ mult fxs on Prolia Q55mo; managed by DrGherghe & last seen 06/09/15- note reviewed...    GYN> Hx ovarian cancer (in remission- details unknown), s/p hyst & Ca125=9 (08/2014)...    Ortho/ Rheum> DJD w/ TKR, FM on Lyrica,  Tramadol, Percocet,etc, severe LBP requires epidural shots; she is on a huge dose of Lyrica; PMR on Pred (details unknown); her Sed=52 and she will prob need Rheum to help sort this out...    Neuro> on Aricept5 per DrCrawford, I wonder if she is taking any/ all of her meds properly, no care giver accompanies pt to office visits, she is ?stoic ?oblivious...    Heme> Hg 10-11 range May2016 after fall w/ fx nose & wrist; similar now & may need further anemia work-up as well, deferred to PCP... EXAM showed Afeb, VSS, O2sat=98% on RA;  Wt=161#;  Heent- neg, mallampati3;  Chest- decr BS bilat, not congested, w/o w/r/r heard;  Heart- RR gr1/6SEM w/o r/g detected;  Abd- soft, nontender, +panniculus;  Ext- VI w/o c/c/e...   LABS 2016:  Reviewed... IMP/PLAN>>  We again outlined for MrsNatter a vigorous home treatment regimen for her bronchiectasis & mucoid impactions including a TID regimen>  NEB w/ Albut vs Duoneb Tid, followed by her Advair500Bid & Spiriva once/d, Guaifenesin 400mg  tabs- 2Tid w/ fluids, take her low dose Pred in AM & Singulair in PM; she declines to do chest PT/ Flutter & postural drainage treatments...  ~  October 01, 2015:  42mo ROV & add-on appt requested by pt for COPD exac>  She reports that she went to a granddaughter's wedding in [place] 6/24, then spent 1wk in [place] and "caught a cold", didn't go to UC or see a physician, just "toughed it out", getting better slowly but symptoms lingering she says> head is woozy, nasal drainage, sl cough, white sput (no discoloration or blood), sl tight/ wheezy/ congested but no CP/ f/c/s, etc... She reminds me that "ZPak doesn't work for me", and she wants Doxy;  It is apparent that she is still NOT doing her Resp treatment regimen as I have prev requested & outlined for her (see below)- acts as if this is the first time she has heard this from me, she remains on Aricept5 from PCP & has not seen Neuro...we reviewed the following medical problems during today's office  visit >>     Pulmonary> Hx COPD/ Bronchiectasis/ Abn CT Chest w/ mucoid impactions, tracheobronchomalacia w/ air trapping, 10mm LLL nodule, Hx granulomatous lung dis & reported atyp mycobacterium in  the past>  See Hi-res CTChest 12/2014;  We encouraged a regular bronchodilator regimen (NEBS w/ Albut vs Duoneb Tid, ADVAIR500Bid, SPIRIVA daily), Guaifenesin 400mg -2Tid, Singulair10, Fluids, Flutter/ postural drainage/ purse-lip breathing exercises, & check sput for cultures but she could not be bothered & refused further pulm treatment/ testing sput/ etc=> still not cooperating w/ max chest physiotherapy regimen & states she only gets a sm amt of whitish sput (unable to successfully induce sput in office...    Cardiac> on Coreg6.25- 1/2Bid for ?HBP; she has mild AS & DiastCHF on 2DEcho and this needs follow up w/ her PCP vs Cards...    Endocrine> IDDM on Lantus & Novolog (A1c=6.3), HL on Crestor10 (FLP- controlled), Hypothyroid on Synthroid50, transiently elev serum calcium ?etiology (resolved spont), osteoporosis w/ mult fxs on Prolia Q64mo; managed by DrGherghe & last seen 06/09/15- note reviewed...    GYN> Hx ovarian cancer (in remission- details unknown), s/p hyst & Ca125=9 (08/2014)... She is followed by DrFernandez now...    Ortho/ Rheum> DJD w/ TKR, FM on Lyrica, Tramadol, Percocet,etc, severe LBP requires epidural shots; she is on a huge dose of Lyrica; PMR on Pred (details unknown); her Sed=52 and she saw [facility] for Rheum [facility] 09/03/15- severe OA, FM, PMR, & osteoporosis on Lyrica, Pred, Percocet, & Prolia; she felt worse when out of her Lyrica    Neuro> on Aricept5 per DrCrawford, I wonder if she is taking any/ all of her meds properly, no care giver accompanies pt to office visits, she is ?stoic ?oblivious...    Heme> Hg 10-11 range May2016 after fall w/ fx nose & wrist; similar now & may need further anemia work-up as well, deferred to PCP... EXAM showed Afeb, VSS, O2sat=96% on RA;  Wt=167#;  Heent-  neg, mallampati3;  Chest- decr BS bilat, not congested, w/o w/r/r heard;  Heart- RR gr1/6SEM w/o r/g detected;  Abd- soft, nontender, +panniculus;  Ext- VI w/o c/c/e...   CXR 11/04/15 showedCOPD, scarring in right mid lung, chr changes and NAD... IMP/PLAN>>  Berneice has a URI & mild COPD exac=> we will treat w/ Doxy100Bid (her request) and Medrol dosepak;  We again reviewed the recommended treatment protocol for her bronchiectasis (it was like she heard it for the 1st time)>  NEBS Tid, followed by [facility] & SPIRIVA daily, +Guaifenesin400-2Tid + Fluids, take the low dose Pred in AM (per Rheum) and the Singulair10 in PM, work to expectorate the phlegm but she declines to do chest PT, Flutter, postural drainage, etc... we plan ROV recheck in 15mo...  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    GI> prev eval apparently in [place] pt doesn't recall, no records avail, she has Prevacid30 & Zantac150Bid listed on med list but she says just prn... EXAM showed Afeb, VSS, O2sat=97% on RA;  Wt=162#;  Heent- neg, mallampati3;  Chest- decr BS bilat, not congested, w/o w/r/r heard;  Heart- RR gr1/6SEM w/o r/g detected;  Abd- soft, nontender, +panniculus;  Ext- VI w/o c/c/e...  IMP/PLAN>>  MsNatter has serious multisys dis & is managed by DrCrawford-PCP;  From the pulm standpoint she continues to downplay the severity of her lung dis & still refuses/ forgets/ etc to do her breathing treatments as we have outlined for her- REC to use NEB Tid, followed by Advair500Bid, Spiriva once dailyGFN400-2Tid w/ fluids, Singulair10, & Flutter valve/ postural drain/ purse-lip breathig treatments TID... Today we reviewed this regimen & added a Medrol Dosepak... She now appears to have signif GI-GERD/ reflux/ prob LPR & we added a vigorous antireflux regimen=> Prevacid30 taken 30 miin before dinner, NPO after dinner, elev HOB 6" blocks... She needs to see GI for further eval & needs these recommendations reinforced by them & her PCP...  ~  April 13, 2016:  40mo ROV & once again MsNatter returns by herself for a f/u visit> she tells me that she is doing satis, no new complaints or concerns;  When asked about her meds she says they are all the same but she does not know the names or the dosing regimen;  She states her breathing is the same- min SOB w/o change- eg w/ walking (says lim by knee); notes mild cough/ sput- clear w/o color or blood seen, no CP;  Today's agenda involves getting a handicap placard- we filled it out for her... 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 We re-iterate the max regimen at each office visit & print it out on the AVS for family to review & help pt with...    Cardiac> on Coreg6.25- 1/2Bid for ?HBP; she has mild AS & DiastCHF on 2DEcho and this needs follow up w/ her PCP (DrCrawford) vs Cards consult ...    Endocrine> IDDM on Lantus & Novolog (A1c=6.3), HL on Crestor10 (FLP- controlled), Hypothyroid on Synthroid50, transiently elev serum calcium ?etiology (resolved spont), osteoporosis w/ mult fxs on Prolia Q67mo; managed by DrGherghe but last seen 06/09/15- note reviewed; she sees DrCrawford frequently...    GYN> Hx  ovarian cancer (in remission- details unknown), s/p hyst & Ca125=9 (08/2014)... She is followed by DrFernandez now...    Ortho/ Rheum> DJD w/ TKR, FM on Lyrica, Tramadol, Percocet,etc, severe LBP requires epidural shots; she is on a huge dose of Lyrica; PMR on Pred (details unknown); her Sed=52 and she saw [facility] for Rheum 03/23/16- 60mo rov for  severe OA, FM, PMR, & osteoporosis on Lyrica 300Bid, Pred 2mg /d, Percocet, & Prolia (last dose 02/24/16); she felt worse when out of her Lyrica    Neuro & Psyche> on Aricept5 per DrCrawford plus Buspar & Cymbalta- I wonder if she is taking any/ all of her meds properly, no care  giver accompanies pt to office visits, she is ?stoic ?oblivious...    Heme> Hg 10-11 range May2016 after fall w/ fx nose & wrist; similar now & may need further anemia work-up as well, deferred to PCP... EXAM showed Afeb, VSS, O2sat=95% on RA;  Wt=160#;  Heent- neg, mallampati3;  Chest- decr BS bilat, not congested, w/o w/r/r heard;  Heart- RR gr1/6SEM w/o r/g detected;  Abd- soft, nontender, +panniculus;  Ext- VI w/o c/c/e...   Ambulatory Oximetry 04/13/16>  O2sat=92% on RA at rest w/ pulse= 80/min... She ambulated 1 Lap in office (185') w/ O2sat drop to 84% with pulse=89/min... IMP/PLAN>>  Kensly has exercise induced hypoxemia & needs a POC w/ O2 at 2L/min w/ activity;  We will check an ONO in the interim to see if she needs nocturnal O2 as well;  In the meanwhile we once again reviewed w/ the pt regarding her MAX respiratory regimen-- Albut via NEB Tid, followed by Advair500Bid & Spiriva daily; continue GFN 400mg -2Tid w/ fluids; on Pred 2mg /d by rheum for PMR; on Singulair10 & flonase;  We also reviewed her antireflux regimen w/ Prevacid30 taken 30 min before dinner, NPO after dinner, elev HOB 6"... we plan another recheck in 60mo...   ~  July 12, 2016:  77mo ROV & Zoiey is here by herself again today- no complaints or concerns- states her breathing is good, she uses her portable O2 as needed (she doesn't like the O2 conc- makes too much noise, gives her migraines, etc), denies CP/ palpt/ SOB/ edema; notes min cough small amt whitish sput, no hemoptysis; denies f/c/s and says she is active & exercising on her own (does not go to gym etc- we discussed silver sneakers etc...    Her PCP is DrCrawford, requested referral to Urology for urinary incont, given handicap placard for parking, wants a new eye doctor due to droopy eyelids (rec [facility]), pt says that [facility] checked her anemia- "it's good, not to worry"... We reviewed the following medical problems during today's office visit >>     Pulmonary> Hx COPD/  Bronchiectasis/ Abn CT Chest w/ mucoid impactions, tracheobronchomalacia w/ air trapping, 108mm LLL nodule, Hx granulomatous lung dis & reported atyp mycobacterium in the past>  See Hi-res CTChest 12/2014;  We encouraged a regular bronchodilator regimen (NEBS w/ Albut vs Duoneb Tid, ADVAIR500Bid, SPIRIVA daily), Guaifenesin 400mg -2Tid, Singulair10, Fluids, Flutter/ postural drainage/ purse-lip breathing exercises, & check sput for cultures but she could not be bothered & refused further pulm treatment/ testing sput/ etc=> still not cooperating w/ max chest physiotherapy regimen & states she only gets a sm amt of whitish sput (unable to successfully induce sput in office... We re-iterate the max regimen at each office visit & print it out on the AVS for family to review & help pt with...    Cardiac> on  Coreg6.25- 1/2Bid for ?HBP; she has mild AS & DiastCHF on 2DEcho and this needs follow up w/ her PCP (DrCrawford) vs Cards consult ...    Endocrine> IDDM on Lantus & Novolog (A1c=6.3), HL on Crestor10 (FLP- controlled), Hypothyroid on Synthroid50, transiently elev serum calcium ?etiology (resolved spont), osteoporosis w/ mult fxs on Prolia Q42mo; managed by DrGherghe but last seen 06/09/15- note reviewed; she sees DrCrawford frequently...    GYN> Hx ovarian cancer (in remission- details unknown), s/p hyst & Ca125=9 (08/2014)... She is followed by DrFernandez now...    Ortho/ Rheum> DJD w/ TKR, FM on Lyrica, Tramadol, Percocet,etc, severe LBP requires epidural shots; she is on a huge dose of Lyrica; PMR on Pred (details unknown); her Sed=52 and she saw [facility] for Rheum 03/23/16- 72mo rov for  severe OA, FM, PMR, & osteoporosis on Lyrica 300Bid, Pred 2mg /d, Percocet, & Prolia (last dose 02/24/16); she felt worse when out of her Lyrica    Neuro & Psyche> on Aricept5 per DrCrawford plus Buspar & Cymbalta- I wonder if she is taking any/ all of her meds properly, no care giver accompanies pt to office visits, she is ?stoic  ?oblivious...    Heme> Hg 10-11 range May2016 after fall w/ fx nose & wrist; similar now & may need further anemia work-up as well, deferred to PCP... EXAM showed Afeb, VSS, O2sat=97% on RA;  Wt=151#;  Heent- neg, mallampati3;  Chest- decr BS bilat, not congested, w/o w/r/r heard;  Heart- RR gr1/6SEM w/o r/g detected;  Abd- soft, nontender, +panniculus;  Ext- VI w/o c/c/e...   Ambulatory Oximetry 07/12/16>  O2sat=94% on RA at rest w/ pulse=97/min;  She ambulated 2 laps in office (185'ea) w/ lowest O2sat=88% w/ pulse=112/min;  This is improved from ZYS0630;  Advised O2 at 2L/min w/ activities...  IMP/PLAN>>  Pt is asked to use O2 w/ ambulation & reminded again about the recommended pulm Rx- NEBS w/ Albut vs Duoneb Tid, ADVAIR500Bid, SPIRIVA daily, Guaifenesin 400mg -2Tid, Singulair10, Fluids, Flutter/ postural drainage/ purse-lip breathing exercises; call for any breathing issues, rov ~8mo...    Past Medical History:  Diagnosis Date  . Arthritis   . Asthma   . Diabetes mellitus without complication ([place])   . Fibromyalgia   . GERD (gastroesophageal reflux disease)   . History of chemotherapy   . History of fractured pelvis   . Hypertension   . Osteoporosis   . Osteoporosis   . Ovarian cancer [facility])     Past Surgical History  Procedure Laterality Date  . Hip surgey    . Knee surgey    . Lung surgery >> resection of nodule 2000, proved to be a benign granuloma & ?atyp mycobacterium ident at the same time?    . Bladder suspension    . Ankle fracture surgery      plate and screws  . Abdominal hysterectomy  1996    Outpatient Encounter Prescriptions as of 07/12/2016  Medication Sig  . albuterol (PROVENTIL HFA;VENTOLIN HFA) 108 (90 Base) MCG/ACT inhaler Inhale 2 puffs into the lungs every 6 (six) hours as needed for wheezing or shortness of breath.  Marland Kitchen albuterol (PROVENTIL) (2.5 MG/3ML) 0.083% nebulizer solution Take 3 mLs (2.5 mg total) by nebulization daily.  . BD PEN NEEDLE NANO U/F 32G  X 4 MM MISC USE 4 TIMES A DAY  . busPIRone (BUSPAR) 10 MG tablet Take 1 tablet (10 mg total) by mouth 2 (two) times daily.  . carvedilol (COREG) 6.25 MG tablet Take 3.125 mg by mouth 2 (  two) times daily with a meal.   . denosumab (PROLIA) 60 MG/ML SOLN injection Inject 60 mg into the skin every 6 (six) months. Administer in upper arm, thigh, or abdomen  . Dexlansoprazole 30 MG capsule Take 1 capsule (30 mg total) by mouth daily.  Marland Kitchen Dextromethorphan-Guaifenesin (MUCINEX DM MAXIMUM STRENGTH) 60-1200 MG TB12 Take 1 tablet by mouth 2 (two) times daily as needed (for cough/phlegm). Reported on 06/23/2015  . donepezil (ARICEPT) 5 MG tablet TAKE 1 TABLET (5 MG TOTAL) BY MOUTH AT BEDTIME.  . DULoxetine (CYMBALTA) 60 MG capsule Take 60 mg by mouth daily.  . fluticasone (FLONASE) 50 MCG/ACT nasal spray Place 1-2 sprays into both nostrils daily as needed for allergies or rhinitis.  . Fluticasone-Salmeterol (ADVAIR DISKUS) 500-50 MCG/DOSE AEPB Inhale 1 puff into the lungs 2 (two) times daily.  Marland Kitchen glucose blood (FREESTYLE TEST STRIPS) test strip Use to test blood sugar 3 times daily. Dx: E11.65  . HYDROcodone-homatropine (HYCODAN) 5-1.5 MG/5ML syrup Take 5 mLs by mouth every 6 (six) hours as needed for cough.  . insulin lispro (HUMALOG KWIKPEN) 100 UNIT/ML KiwkPen Inject 4-6 units into the skin daily.  . Lancets (FREESTYLE) lancets Use to test blood sugar 3 times daily. Dx: E11.65  . LANTUS SOLOSTAR 100 UNIT/ML Solostar Pen INJECT 30 UNITS SUBCUTANEOUSLY EVERY MORNING  . levothyroxine (SYNTHROID, LEVOTHROID) 50 MCG tablet Take 1 tablet (50 mcg total) by mouth daily before breakfast.  . montelukast (SINGULAIR) 10 MG tablet TAKE 1 TABLET (10 MG TOTAL) BY MOUTH DAILY.  . NON FORMULARY 1 Syringe by Epidural route every 3 (three) months. Epidural Steroid Injections for Lumbar Spinal Stenosis up to 4 times a year  . oxyCODONE-acetaminophen (PERCOCET/ROXICET) 5-325 MG tablet Take 1 tablet by mouth 2 (two) times daily as  needed for severe pain.  . predniSONE (DELTASONE) 1 MG tablet Take 2 mg by mouth daily with breakfast.   . pregabalin (LYRICA) 300 MG capsule Take 1 capsule (300 mg total) by mouth 2 (two) times daily.  . ranitidine (ZANTAC) 150 MG tablet Take 150 mg by mouth 2 (two) times daily as needed for heartburn.  . rosuvastatin (CRESTOR) 10 MG tablet Take 1 tablet (10 mg total) by mouth every evening.  . tiotropium (SPIRIVA HANDIHALER) 18 MCG inhalation capsule Place 1 capsule (18 mcg total) into inhaler and inhale daily.  Marland Kitchen zolpidem (AMBIEN) 5 MG tablet Take 1 tablet (5 mg total) by mouth at bedtime.  . [DISCONTINUED] methylPREDNISolone (MEDROL) 4 MG TBPK tablet Take as directed (Patient not taking: Reported on 07/12/2016)  . [DISCONTINUED] oseltamivir (TAMIFLU) 75 MG capsule Take 1 capsule (75 mg total) by mouth 2 (two) times daily. (Patient not taking: Reported on 07/12/2016)   No facility-administered encounter medications on file as of 07/12/2016.     Allergies  Allergen Reactions  . Latex Rash  . Penicillins Rash    Current Medications, Allergies, Past Medical History, Past Surgical History, Family History, and Social History were reviewed in [employer] record.   Review of Systems             All symptoms NEG except where BOLDED >>  Constitutional:  F/C/S, fatigue, anorexia, unexpected weight change. HEENT:  HA, visual changes, hearing loss, earache, nasal symptoms, sore throat, mouth sores, hoarseness. Resp:  cough, sputum, hemoptysis; SOB, tightness, wheezing. Cardio:  CP, palpit, DOE, orthopnea, edema. GI:  N/V/D/C, blood in stool; reflux, abd pain, distention, gas. GU:  dysuria, freq, urgency, hematuria, flank pain, voiding difficulty. MS:  joint pain, swelling, tenderness, decr ROM; neck pain, back pain, etc. Neuro:  HA, tremors, seizures, dizziness, syncope, weakness, numbness, gait abn. Skin:  suspicious lesions or skin rash. Heme:  adenopathy, bruising,  bleeding. Psyche:  confusion, agitation, sleep disturbance, hallucinations, anxiety, depression suicidal.   Objective:   Physical Exam       Vital Signs:  Reviewed...   General:  WD, WN, 81 y/o WF in NAD; alert & oriented; pleasant & cooperative... HEENT:  [place]/AT; Conjunctiva- pink, Sclera- nonicteric, EOM-wnl, PERRLA, EACs-clear, TMs-wnl; NOSE-clear; THROAT-clear & wnl.  Neck:  Supple w/ fairROM; no JVD; normal carotid impulses w/o bruits; no thyromegaly or nodules palpated; no lymphadenopathy.  Chest:  Decr BS bilat, few scat rhonchi otherw clear not congested no wheezing rales or signs of consolidation... Heart:  Regular Rhythm; norm S1 & S2 w/ Gr1/6 SEM w/o rubs or gallops detected... Abdomen:  Soft & nontender, +panniculus- no guarding or rebound; normal bowel sounds; no organomegaly or masses palpated. Ext:  decrROM; without deformities +arthritic changes; +venous insuffic, no edema;  Pulses intact w/o bruits. Neuro:  No focal neuro deficit; abn gait & balance poor, uses cane... Derm:  No lesions noted; no rash etc. Lymph:  No cervical, supraclavicular, axillary, or inguinal adenopathy palpated.   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    Cardiac> on Coreg6.25- 1/2Bid for ?HBP; she has  mild AS & DiastCHF on 2DEcho and this needs follow up w/ her PCP vs Cards...    Endocrine> IDDM on Lantus & Novolog (A1c=6.3), HL on Crestor10 (FLP- controlled), Hypothyroid on Synthroid50, transiently elev serum calcium ?etiology (resolved spont), osteoporosis w/ mult fxs on Prolia Q61mo; managed by DrGherghe & last seen 06/09/15- note reviewed...    GI> She presented w/ symptoms of reflux/ LPR w/ cough while eating etc => needs GI eval & we will refer (see recs below)...    GYN> Hx ovarian cancer (in remission- details unknown), s/p hyst & Ca125=9 (08/2014)... She is followed by DrFernandez now...    Ortho/ Rheum> DJD w/ TKR, FM on Lyrica, Tramadol, Percocet,etc, severe LBP requires epidural shots; she is on a huge dose of Lyrica; PMR on Pred (details unknown); her Sed=52 and she saw [facility] for Rheum last 09/03/15- severe OA, FM, PMR, & osteoporosis on Lyrica, Pred, Percocet, & Prolia; she felt worse when out of her Lyrica    Neuro> on Aricept5 per DrCrawford, I wonder if she is taking any/ all of her meds properly, no care giver accompanies pt to office visits, she is ?stoic ?oblivious...    Heme> Hg 10-11 range May2016 after fall w/ fx nose & wrist; similar now & may need further anemia work-up as well, deferred to PCP...  PLAN >>   01/19/15>   We discussed Rx w/ ZPak for her URI (it could also help her if she has  atyp-mycobacterium); we compromised rx w/ NEBS TID- followed by AdvairBid & Spiriva once daily, plus the MUCINEX1200mg Bid w/ fluids;  She again declines the added benefit from a vigorous chest PT regimen, flutter valve, postural drainage, purse lip breathing, etc... 06/23/15>   We again outlined for MrsNatter a vigorous home treatment regimen for her bronchiectasis & mucoid impactions including a TID regimen>  NEB w/ Albut vs Duoneb Tid, followed by her Advair500Bid & Spiriva once/d, Guaifenesin 400mg  tabs- 2Tid w/ fluids, take her low dose Pred in AM & Singulair in PM; she declines to do chest  PT/ Flutter & postural drainage treatments... 10/01/15>   Marshayla has a URI & mild COPD exac=> we will treat w/ Doxy100Bid (her request) and Medrol dosepak;  We again reviewed the recommended treatment protocol for her bronchiectasis (it was like she heard it for the 1st time)>  NEBS Tid, followed by [facility] & SPIRIVA daily, +Guaifenesin400-2Tid + Fluids, take the low dose Pred in AM (per Rheum) and the Singulair10 in PM, work to expectorate the phlegm but she declines to do chest PT, Flutter, postural drainage, etc... we plan ROV recheck in 43mo... 12/25/15>   MsNatter has serious multisys dis & is managed by DrCrawford-PCP;  From the pulm standpoint she continues to downplay the severity of her lung dis & still refuses/ forgets/ etc to do her breathing treatments as we have outlined for her- REC to use NEB Tid, followed by Advair500Bid, Spiriva once dailyGFN400-2Tid w/ fluids, Singulair10, & Flutter valve/ postural drain/ purse-lip breathig treatments TID... Today we reviewed this regimen & added a Medrol Dosepak... She now appears to have signif GI-GERD/ reflux/ prob LPR & we added a vigorous antireflux regimen=> Prevacid30 taken 30 miin before dinner, NPO after dinner, elev HOB 6" blocks... She needs to see GI for further eval & needs these recommendations reinforced by them & her PCP. 04/13/16>   Mindee has exercise induced hypoxemia & needs a POC w/ O2 at 2L/min w/ activity;  We will check an ONO in the interim to see if she needs nocturnal O2 as well;  In the meanwhile we once again reviewe4d w/ the pt regarding her MAX respiratory regimen-- Albut via NEB Tid, followed by Advair500Bid & Spiriva daily; continue GFN 400mg -2Tid w/ fluids; on Pred 2mg /d by rheum for PMR; on Singulair10 & flonase;  We also reviewed her antireflux regimen w/ Prevacid30 taken 30 min before dinner, NPO after dinner, elev HOB 6" 07/12/16>   Pt is asked to use O2 w/ ambulation & reminded again about the recommended pulm Rx- NEBS w/  Albut vs Duoneb Tid, ADVAIR500Bid, SPIRIVA daily, Guaifenesin 400mg -2Tid, Singulair10, Fluids, Flutter/ postural drainage/ purse-lip breathing exercises; call for any breathing issues, rov ~89mo   Plan:     Patient's Medications  New Prescriptions   No medications on file  Previous Medications   ALBUTEROL (PROVENTIL HFA;VENTOLIN HFA) 108 (90 BASE) MCG/ACT INHALER    Inhale 2 puffs into the lungs every 6 (six) hours as needed for wheezing or shortness of breath.   ALBUTEROL (PROVENTIL) (2.5 MG/3ML) 0.083% NEBULIZER SOLUTION    Take 3 mLs (2.5 mg total) by nebulization daily.   BD PEN NEEDLE NANO U/F 32G X 4 MM MISC    USE 4 TIMES A DAY   BUSPIRONE (BUSPAR) 10 MG TABLET    Take 1 tablet (10 mg total) by mouth 2 (two) times daily.   CARVEDILOL (COREG) 6.25 MG TABLET    Take 3.125 mg by mouth 2 (two) times daily  with a meal.    DENOSUMAB (PROLIA) 60 MG/ML SOLN INJECTION    Inject 60 mg into the skin every 6 (six) months. Administer in upper arm, thigh, or abdomen   DEXLANSOPRAZOLE 30 MG CAPSULE    Take 1 capsule (30 mg total) by mouth daily.   DEXTROMETHORPHAN-GUAIFENESIN (MUCINEX DM MAXIMUM STRENGTH) 60-1200 MG TB12    Take 1 tablet by mouth 2 (two) times daily as needed (for cough/phlegm). Reported on 06/23/2015   DONEPEZIL (ARICEPT) 5 MG TABLET    TAKE 1 TABLET (5 MG TOTAL) BY MOUTH AT BEDTIME.   DULOXETINE (CYMBALTA) 60 MG CAPSULE    Take 60 mg by mouth daily.   FLUTICASONE (FLONASE) 50 MCG/ACT NASAL SPRAY    Place 1-2 sprays into both nostrils daily as needed for allergies or rhinitis.   FLUTICASONE-SALMETEROL (ADVAIR DISKUS) 500-50 MCG/DOSE AEPB    Inhale 1 puff into the lungs 2 (two) times daily.   GLUCOSE BLOOD (FREESTYLE TEST STRIPS) TEST STRIP    Use to test blood sugar 3 times daily. Dx: E11.65   HYDROCODONE-HOMATROPINE (HYCODAN) 5-1.5 MG/5ML SYRUP    Take 5 mLs by mouth every 6 (six) hours as needed for cough.   INSULIN LISPRO (HUMALOG KWIKPEN) 100 UNIT/ML KIWKPEN    Inject 4-6 units into  the skin daily.   LANCETS (FREESTYLE) LANCETS    Use to test blood sugar 3 times daily. Dx: E11.65   LANTUS SOLOSTAR 100 UNIT/ML SOLOSTAR PEN    INJECT 30 UNITS SUBCUTANEOUSLY EVERY MORNING   LEVOTHYROXINE (SYNTHROID, LEVOTHROID) 50 MCG TABLET    Take 1 tablet (50 mcg total) by mouth daily before breakfast.   MONTELUKAST (SINGULAIR) 10 MG TABLET    TAKE 1 TABLET (10 MG TOTAL) BY MOUTH DAILY.   NON FORMULARY    1 Syringe by Epidural route every 3 (three) months. Epidural Steroid Injections for Lumbar Spinal Stenosis up to 4 times a year   OXYCODONE-ACETAMINOPHEN (PERCOCET/ROXICET) 5-325 MG TABLET    Take 1 tablet by mouth 2 (two) times daily as needed for severe pain.   PREDNISONE (DELTASONE) 1 MG TABLET    Take 2 mg by mouth daily with breakfast.    PREGABALIN (LYRICA) 300 MG CAPSULE    Take 1 capsule (300 mg total) by mouth 2 (two) times daily.   RANITIDINE (ZANTAC) 150 MG TABLET    Take 150 mg by mouth 2 (two) times daily as needed for heartburn.   ROSUVASTATIN (CRESTOR) 10 MG TABLET    Take 1 tablet (10 mg total) by mouth every evening.   TIOTROPIUM (SPIRIVA HANDIHALER) 18 MCG INHALATION CAPSULE    Place 1 capsule (18 mcg total) into inhaler and inhale daily.   ZOLPIDEM (AMBIEN) 5 MG TABLET    Take 1 tablet (5 mg total) by mouth at bedtime.  Modified Medications   No medications on file  Discontinued Medications   METHYLPREDNISOLONE (MEDROL) 4 MG TBPK TABLET    Take as directed   OSELTAMIVIR (TAMIFLU) 75 MG CAPSULE    Take 1 capsule (75 mg total) by mouth 2 (two) times daily.

## 2016-07-13 ENCOUNTER — Other Ambulatory Visit: Payer: Self-pay | Admitting: Internal Medicine

## 2016-07-14 ENCOUNTER — Other Ambulatory Visit: Payer: Self-pay

## 2016-07-14 ENCOUNTER — Telehealth: Payer: Self-pay | Admitting: Internal Medicine

## 2016-07-14 MED ORDER — FREESTYLE LANCETS MISC: 3 refills | Status: DC

## 2016-07-14 NOTE — Telephone Encounter (Signed)
Need prescription for  Lancets (FREESTYLE) lancets  [id number] - [place], [place] - [address]

## 2016-07-14 NOTE — Telephone Encounter (Signed)
Submitted

## 2016-07-15 ENCOUNTER — Ambulatory Visit (INDEPENDENT_AMBULATORY_CARE_PROVIDER_SITE_OTHER): Payer: Medicare Other | Admitting: Internal Medicine

## 2016-07-15 ENCOUNTER — Encounter: Payer: Self-pay | Admitting: Internal Medicine

## 2016-07-15 ENCOUNTER — Other Ambulatory Visit (INDEPENDENT_AMBULATORY_CARE_PROVIDER_SITE_OTHER): Payer: Medicare Other

## 2016-07-15 VITALS — BP 140/90 | HR 82 | Temp 98.2°F | Resp 14 | Ht 60.0 in | Wt 152.0 lb

## 2016-07-15 DIAGNOSIS — E039 Hypothyroidism, unspecified: Secondary | ICD-10-CM | POA: Diagnosis not present

## 2016-07-15 DIAGNOSIS — M797 Fibromyalgia: Secondary | ICD-10-CM

## 2016-07-15 DIAGNOSIS — E1121 Type 2 diabetes mellitus with diabetic nephropathy: Secondary | ICD-10-CM | POA: Diagnosis not present

## 2016-07-15 DIAGNOSIS — Z794 Long term (current) use of insulin: Secondary | ICD-10-CM

## 2016-07-15 LAB — COMPREHENSIVE METABOLIC PANEL
ALT: 16 U/L (ref 0–35)
AST: 16 U/L (ref 0–37)
Albumin: 3.7 g/dL (ref 3.5–5.2)
Alkaline Phosphatase: 39 U/L (ref 39–117)
BUN: 15 mg/dL (ref 6–23)
CO2: 31 mEq/L (ref 19–32)
Calcium: 9.3 mg/dL (ref 8.4–10.5)
Chloride: 105 mEq/L (ref 96–112)
Creatinine, Ser: 0.86 mg/dL (ref 0.40–1.20)
GFR: 67.64 mL/min (ref >60.00)
Glucose, Bld: 86 mg/dL (ref 70–99)
Potassium: 4.2 mEq/L (ref 3.5–5.1)
Sodium: 141 mEq/L (ref 135–145)
Total Bilirubin: 0.3 mg/dL (ref 0.2–1.2)
Total Protein: 6.6 g/dL (ref 6.0–8.3)

## 2016-07-15 LAB — LIPID PANEL
Cholesterol: 219 mg/dL — ABNORMAL HIGH (ref 0–200)
HDL: 68.6 mg/dL (ref >39.00)
LDL Cholesterol: 113 mg/dL — ABNORMAL HIGH (ref 0–99)
NonHDL: 149.93
Total CHOL/HDL Ratio: 3
Triglycerides: 187 mg/dL — ABNORMAL HIGH (ref 0.0–149.0)
VLDL: 37.4 mg/dL (ref 0.0–40.0)

## 2016-07-15 LAB — TSH: TSH: 0.61 u[IU]/mL (ref 0.35–4.50)

## 2016-07-15 LAB — HEMOGLOBIN A1C: Hgb A1c MFr Bld: 6.9 % — ABNORMAL HIGH (ref 4.6–6.5)

## 2016-07-15 LAB — T4, FREE: Free T4: 0.69 ng/dL (ref 0.60–1.60)

## 2016-07-15 MED ORDER — GLUCOSE BLOOD VI STRP: ORAL_STRIP | 3 refills | Status: DC

## 2016-07-15 MED ORDER — OXYCODONE-ACETAMINOPHEN 5-325 MG PO TABS: 1.0000 | ORAL_TABLET | Freq: Two times a day (BID) | ORAL | 0 refills | Status: DC | PRN

## 2016-07-15 MED ORDER — FLUTICASONE PROPIONATE 50 MCG/ACT NA SUSP: 1.0000 | Freq: Every day | NASAL | 6 refills | Status: DC | PRN

## 2016-07-15 MED ORDER — ROSUVASTATIN CALCIUM 10 MG PO TABS: 10.0000 mg | ORAL_TABLET | Freq: Every evening | ORAL | 2 refills | Status: DC

## 2016-07-15 MED ORDER — ZOLPIDEM TARTRATE 5 MG PO TABS: 5.0000 mg | ORAL_TABLET | Freq: Every day | ORAL | 2 refills | Status: DC

## 2016-07-15 MED ORDER — DEXLANSOPRAZOLE 30 MG PO CPDR: 30.0000 mg | DELAYED_RELEASE_CAPSULE | Freq: Every day | ORAL | 2 refills | Status: DC

## 2016-07-15 MED ORDER — FREESTYLE LANCETS MISC: 3 refills | Status: DC

## 2016-07-15 NOTE — Patient Instructions (Signed)
We have done the refills today and will check the labs.

## 2016-07-15 NOTE — Progress Notes (Signed)
   Subjective:    Patient ID: Kayla Thompson, female    DOB: 08-14-1935, 81 y.o.   MRN: [id number]  HPI The patient is a 81 YO female coming in for follow up of her chronic pain syndrome. She has been using the oxycodone rarely when she is not able to do ADLS and be mobile. She does take the lyrica everyday and this typically helps a good amount. She does have chronic fibromyalgia and rheumatoid which causes her some of her chronic pain as it was not treated for some time due to lack of interventions. She is seeing psych for her anxiety and depression and they have recently increased her cymbalta.   Review of Systems  Constitutional: Positive for activity change. Negative for appetite change, chills, fatigue, fever and unexpected weight change.  Respiratory: Negative.   Cardiovascular: Negative.   Gastrointestinal: Negative.   Musculoskeletal: Positive for arthralgias, back pain and myalgias. Negative for gait problem and joint swelling.  Skin: Negative.   Psychiatric/Behavioral: Negative.       Objective:   Physical Exam  Constitutional: She is oriented to person, place, and time. She appears well-developed and well-nourished.  HENT:  Head: Normocephalic and atraumatic.  Eyes: EOM are normal.  Neck: Normal range of motion.  Cardiovascular: Normal rate and regular rhythm.   Pulmonary/Chest: Effort normal. No respiratory distress. She has no wheezes.  Abdominal: Soft. She exhibits no distension. There is no tenderness.  Musculoskeletal:  Chronic pain  Neurological: She is alert and oriented to person, place, and time.  Skin: Skin is warm and dry.   Vitals:   07/15/16 1451  BP: 140/90  Pulse: 82  Resp: 14  Temp: 98.2 F (36.8 C)  TempSrc: Oral  SpO2: 94%  Weight: 152 lb (68.9 kg)  Height: 5' (1.524 m)      Assessment & Plan:

## 2016-07-15 NOTE — Progress Notes (Signed)
Pre visit review using our clinic review tool, if applicable. No additional management support is needed unless otherwise documented below in the visit note. 

## 2016-07-16 NOTE — Assessment & Plan Note (Signed)
Checking Hga1c, referral to ophtho for eye exam. Well controlled and seeing endo soon and wants to do labs at the same time. Taking insulin without low sugars.

## 2016-07-16 NOTE — Assessment & Plan Note (Signed)
Seeing endo and checking TSH and free T4 in anticipation of her upcoming visit. She is taking synthroid 50 mcg daily at this time and adjust as needed.

## 2016-07-16 NOTE — Assessment & Plan Note (Signed)
Refill her oxycodone. Reminded about our controlled substance policy. Reviewed the [place] controlled substance database and no irregular fills or red flags.

## 2016-07-21 DIAGNOSIS — N390 Urinary tract infection, site not specified: Secondary | ICD-10-CM | POA: Diagnosis not present

## 2016-07-21 DIAGNOSIS — N3941 Urge incontinence: Secondary | ICD-10-CM | POA: Diagnosis not present

## 2016-07-29 DIAGNOSIS — F419 Anxiety disorder, unspecified: Secondary | ICD-10-CM | POA: Diagnosis not present

## 2016-07-29 DIAGNOSIS — F331 Major depressive disorder, recurrent, moderate: Secondary | ICD-10-CM | POA: Diagnosis not present

## 2016-08-02 DIAGNOSIS — H2513 Age-related nuclear cataract, bilateral: Secondary | ICD-10-CM | POA: Diagnosis not present

## 2016-08-02 DIAGNOSIS — Z794 Long term (current) use of insulin: Secondary | ICD-10-CM | POA: Diagnosis not present

## 2016-08-02 DIAGNOSIS — H04123 Dry eye syndrome of bilateral lacrimal glands: Secondary | ICD-10-CM | POA: Diagnosis not present

## 2016-08-02 DIAGNOSIS — E119 Type 2 diabetes mellitus without complications: Secondary | ICD-10-CM | POA: Diagnosis not present

## 2016-08-04 ENCOUNTER — Encounter: Payer: Self-pay | Admitting: Gynecology

## 2016-08-12 DIAGNOSIS — F419 Anxiety disorder, unspecified: Secondary | ICD-10-CM | POA: Diagnosis not present

## 2016-08-12 DIAGNOSIS — F331 Major depressive disorder, recurrent, moderate: Secondary | ICD-10-CM | POA: Diagnosis not present

## 2016-08-18 DIAGNOSIS — S8011XA Contusion of right lower leg, initial encounter: Secondary | ICD-10-CM | POA: Diagnosis not present

## 2016-08-26 ENCOUNTER — Ambulatory Visit: Payer: Medicare Other | Admitting: Internal Medicine

## 2016-08-30 ENCOUNTER — Ambulatory Visit (INDEPENDENT_AMBULATORY_CARE_PROVIDER_SITE_OTHER): Payer: Medicare Other | Admitting: Nurse Practitioner

## 2016-08-30 ENCOUNTER — Ambulatory Visit: Payer: Medicare Other | Admitting: Nurse Practitioner

## 2016-08-30 ENCOUNTER — Encounter: Payer: Self-pay | Admitting: Nurse Practitioner

## 2016-08-30 ENCOUNTER — Ambulatory Visit: Payer: Medicare Other | Admitting: Internal Medicine

## 2016-08-30 VITALS — BP 130/78 | HR 91 | Temp 98.6°F | Ht 60.0 in | Wt 149.0 lb

## 2016-08-30 DIAGNOSIS — R609 Edema, unspecified: Secondary | ICD-10-CM

## 2016-08-30 DIAGNOSIS — M159 Polyosteoarthritis, unspecified: Secondary | ICD-10-CM

## 2016-08-30 DIAGNOSIS — M8949 Other hypertrophic osteoarthropathy, multiple sites: Secondary | ICD-10-CM

## 2016-08-30 DIAGNOSIS — M7052 Other bursitis of knee, left knee: Secondary | ICD-10-CM | POA: Diagnosis not present

## 2016-08-30 DIAGNOSIS — E039 Hypothyroidism, unspecified: Secondary | ICD-10-CM | POA: Diagnosis not present

## 2016-08-30 DIAGNOSIS — M1712 Unilateral primary osteoarthritis, left knee: Secondary | ICD-10-CM | POA: Diagnosis not present

## 2016-08-30 DIAGNOSIS — M150 Primary generalized (osteo)arthritis: Secondary | ICD-10-CM | POA: Diagnosis not present

## 2016-08-30 DIAGNOSIS — S8011XD Contusion of right lower leg, subsequent encounter: Secondary | ICD-10-CM

## 2016-08-30 DIAGNOSIS — M797 Fibromyalgia: Secondary | ICD-10-CM

## 2016-08-30 DIAGNOSIS — M25562 Pain in left knee: Secondary | ICD-10-CM | POA: Diagnosis not present

## 2016-08-30 MED ORDER — OXYCODONE-ACETAMINOPHEN 5-325 MG PO TABS: 1.0000 | ORAL_TABLET | Freq: Two times a day (BID) | ORAL | 0 refills | Status: DC | PRN

## 2016-08-30 MED ORDER — PREGABALIN 300 MG PO CAPS: 300.0000 mg | ORAL_CAPSULE | Freq: Two times a day (BID) | ORAL | 1 refills | Status: DC

## 2016-08-30 MED ORDER — LEVOTHYROXINE SODIUM 50 MCG PO TABS: 50.0000 ug | ORAL_TABLET | Freq: Every day | ORAL | 1 refills | Status: DC

## 2016-08-30 MED ORDER — DONEPEZIL HCL 5 MG PO TABS: ORAL_TABLET | ORAL | 1 refills | Status: DC

## 2016-08-30 NOTE — Progress Notes (Signed)
Subjective:  Patient ID: Kayla Thompson, female    DOB: [birth year]  Age: 81 y.o. MRN: [id number]  CC: Follow-up (medication follow up: Aricept, oxycodone, ambian/ fell 2 wk ago, left leg pain--got knee inj from ortho doc--right leg blood clot---will hold the prolia today. )   HPI  Kayla Thompson is here for medication refill. She also informs me that she fell while at [place] 2weeks ago. States she tripped on her cane. Sustained a right LE hematoma. She was evaluated by [facility] after incident. No fracture or head injury sustained. Fall not related to dizziness or syncope.  Indication for chronic opioid: oxycodone, ambien Rate Pain (1-10 scale):10 Medication and dose: 5/325mg  and 5mg  # pills per month: 45tabs and 30tabs Date narcotic database last reviewed (include red flags): reviewed today, no red flags Pharmacy Used: [facility] on [facility].  Outpatient Medications Prior to Visit  Medication Sig Dispense Refill  . albuterol (PROVENTIL HFA;VENTOLIN HFA) 108 (90 Base) MCG/ACT inhaler Inhale 2 puffs into the lungs every 6 (six) hours as needed for wheezing or shortness of breath. 1 Inhaler 11  . albuterol (PROVENTIL) (2.5 MG/3ML) 0.083% nebulizer solution Take 3 mLs (2.5 mg total) by nebulization daily. 270 mL 3  . BD PEN NEEDLE NANO U/F 32G X 4 MM MISC USE 4 TIMES A DAY 400 each 1  . carvedilol (COREG) 6.25 MG tablet Take 3.125 mg by mouth 2 (two) times daily with a meal.     . Dexlansoprazole 30 MG capsule Take 1 capsule (30 mg total) by mouth daily. 90 capsule 2  . Dextromethorphan-Guaifenesin (MUCINEX DM MAXIMUM STRENGTH) 60-1200 MG TB12 Take 1 tablet by mouth 2 (two) times daily as needed (for cough/phlegm). Reported on 06/23/2015    . DULoxetine (CYMBALTA) 60 MG capsule Take 60 mg by mouth daily.    . fluticasone (FLONASE) 50 MCG/ACT nasal spray Place 1-2 sprays into both nostrils daily as needed for allergies or rhinitis. 16 g 6  . Fluticasone-Salmeterol (ADVAIR DISKUS) 500-50 MCG/DOSE  AEPB Inhale 1 puff into the lungs 2 (two) times daily. 3 each 3  . glucose blood (FREESTYLE TEST STRIPS) test strip Use to test blood sugar 3 times daily. Dx: E11.65 100 each 3  . insulin lispro (HUMALOG KWIKPEN) 100 UNIT/ML KiwkPen Inject 4-6 units into the skin daily. 5 pen 1  . Lancets (FREESTYLE) lancets Use to test blood sugar 3 times daily. Dx: E11.65 100 each 3  . LANTUS SOLOSTAR 100 UNIT/ML Solostar Pen INJECT 30 UNITS SUBCUTANEOUSLY EVERY MORNING 15 pen 0  . montelukast (SINGULAIR) 10 MG tablet TAKE 1 TABLET (10 MG TOTAL) BY MOUTH DAILY. 90 tablet 3  . NON FORMULARY 1 Syringe by Epidural route every 3 (three) months. Epidural Steroid Injections for Lumbar Spinal Stenosis up to 4 times a year    . predniSONE (DELTASONE) 1 MG tablet Take 2 mg by mouth daily with breakfast.     . ranitidine (ZANTAC) 150 MG tablet Take 150 mg by mouth 2 (two) times daily as needed for heartburn.    . rosuvastatin (CRESTOR) 10 MG tablet Take 1 tablet (10 mg total) by mouth every evening. 90 tablet 2  . tiotropium (SPIRIVA HANDIHALER) 18 MCG inhalation capsule Place 1 capsule (18 mcg total) into inhaler and inhale daily. 30 capsule 5  . zolpidem (AMBIEN) 5 MG tablet Take 1 tablet (5 mg total) by mouth at bedtime. 30 tablet 2  . busPIRone (BUSPAR) 10 MG tablet Take 1 tablet (10 mg total) by mouth  2 (two) times daily. 60 tablet 0  . donepezil (ARICEPT) 5 MG tablet TAKE 1 TABLET (5 MG TOTAL) BY MOUTH AT BEDTIME. 30 tablet 3  . levothyroxine (SYNTHROID, LEVOTHROID) 50 MCG tablet Take 1 tablet (50 mcg total) by mouth daily before breakfast. 90 tablet 3  . oxyCODONE-acetaminophen (PERCOCET/ROXICET) 5-325 MG tablet Take 1 tablet by mouth 2 (two) times daily as needed for severe pain. 45 tablet 0  . pregabalin (LYRICA) 300 MG capsule Take 1 capsule (300 mg total) by mouth 2 (two) times daily. 180 capsule 1  . denosumab (PROLIA) 60 MG/ML SOLN injection Inject 60 mg into the skin every 6 (six) months. Administer in upper  arm, thigh, or abdomen     No facility-administered medications prior to visit.     ROS See HPI  Objective:  BP 130/78   Pulse 91   Temp 98.6 F (37 C)   Ht 5' (1.524 m)   Wt 149 lb (67.6 kg)   SpO2 98%   BMI 29.10 kg/m   BP Readings from Last 3 Encounters:  08/30/16 130/78  07/15/16 140/90  07/12/16 100/66    Wt Readings from Last 3 Encounters:  08/30/16 149 lb (67.6 kg)  07/15/16 152 lb (68.9 kg)  07/12/16 151 lb (68.5 kg)    Physical Exam  Constitutional: She is oriented to person, place, and time. No distress.  Cardiovascular: Normal rate and normal heart sounds.   Pulmonary/Chest: Effort normal and breath sounds normal.  Musculoskeletal: She exhibits edema and tenderness. She exhibits no deformity.  Right LE hematoma with dependent edema.  Neurological: She is alert and oriented to person, place, and time.  Skin: Skin is warm and dry.  Vitals reviewed.   Lab Results  Component Value Date   WBC 15.7 (H) 12/18/2015   HGB 10.3 (L) 12/18/2015   HCT 32.5 (L) 12/18/2015   PLT 390.0 12/18/2015   GLUCOSE 86 07/15/2016   CHOL 219 (H) 07/15/2016   TRIG 187.0 (H) 07/15/2016   HDL 68.60 07/15/2016   LDLCALC 113 (H) 07/15/2016   ALT 16 07/15/2016   AST 16 07/15/2016   NA 141 07/15/2016   K 4.2 07/15/2016   CL 105 07/15/2016   CREATININE 0.86 07/15/2016   BUN 15 07/15/2016   CO2 31 07/15/2016   TSH 0.61 07/15/2016   HGBA1C 6.9 (H) 07/15/2016    Mm Screening Breast Tomo Bilateral  Result Date: 07/07/2016 CLINICAL DATA:  Screening. EXAM: 2D DIGITAL SCREENING BILATERAL MAMMOGRAM WITH CAD AND ADJUNCT TOMO COMPARISON:  Previous exam(s). ACR Breast Density Category c: The breast tissue is heterogeneously dense, which may obscure small masses. FINDINGS: There are no findings suspicious for malignancy. Images were processed with CAD. IMPRESSION: No mammographic evidence of malignancy. A result letter of this screening mammogram will be mailed directly to the patient.  RECOMMENDATION: Screening mammogram in one year. (Code:SM-B-01Y) BI-RADS CATEGORY  1: Negative. Electronically Signed   By: Franki Cabot M.D.   On: 07/07/2016 17:12    Assessment & Plan:   Anjannette was seen today for follow-up.  Diagnoses and all orders for this visit:  Hypothyroidism, unspecified type -     levothyroxine (SYNTHROID, LEVOTHROID) 50 MCG tablet; Take 1 tablet (50 mcg total) by mouth daily before breakfast.  Fibromyalgia -     pregabalin (LYRICA) 300 MG capsule; Take 1 capsule (300 mg total) by mouth 2 (two) times daily. -     oxyCODONE-acetaminophen (PERCOCET/ROXICET) 5-325 MG tablet; Take 1 tablet by mouth 2 (two)  times daily as needed for severe pain.  Primary osteoarthritis involving multiple joints  Other orders -     donepezil (ARICEPT) 5 MG tablet; TAKE 1 TABLET (5 MG TOTAL) BY MOUTH AT BEDTIME.   I have discontinued Ms. Schwalm's busPIRone. I am also having her maintain her carvedilol, predniSONE, MUCINEX DM MAXIMUM STRENGTH, ranitidine, denosumab, DULoxetine, NON FORMULARY, albuterol, Fluticasone-Salmeterol, tiotropium, albuterol, insulin lispro, montelukast, BD PEN NEEDLE NANO U/F, LANTUS SOLOSTAR, freestyle, zolpidem, Dexlansoprazole, fluticasone, glucose blood, rosuvastatin, pregabalin, levothyroxine, oxyCODONE-acetaminophen, and donepezil.  Meds ordered this encounter  Medications  . pregabalin (LYRICA) 300 MG capsule    Sig: Take 1 capsule (300 mg total) by mouth 2 (two) times daily.    Dispense:  180 capsule    Refill:  1    This request is for a new prescription for a controlled substance as required by Federal/State law..    Order Specific Question:   Supervising Provider    Answer:   Cassandria Anger [[id number]]  . levothyroxine (SYNTHROID, LEVOTHROID) 50 MCG tablet    Sig: Take 1 tablet (50 mcg total) by mouth daily before breakfast.    Dispense:  90 tablet    Refill:  1    Order Specific Question:   Supervising Provider    Answer:   Cassandria Anger [[id number]]  . oxyCODONE-acetaminophen (PERCOCET/ROXICET) 5-325 MG tablet    Sig: Take 1 tablet by mouth 2 (two) times daily as needed for severe pain.    Dispense:  45 tablet    Refill:  0    This is a 30 day supply    Order Specific Question:   Supervising Provider    Answer:   Cassandria Anger [[id number]]  . donepezil (ARICEPT) 5 MG tablet    Sig: TAKE 1 TABLET (5 MG TOTAL) BY MOUTH AT BEDTIME.    Dispense:  90 tablet    Refill:  1    Order Specific Question:   Supervising Provider    Answer:   Cassandria Anger [[id number]]   Follow-up: Return in about 6 months (around 03/01/2017).  Wilfred Lacy, NP

## 2016-08-30 NOTE — Patient Instructions (Addendum)
I do not see any documentation from Dr. Sharlet Salina about increasing Aricept dosage. Please discuss dosage increase with Dr. Sharlet Salina when she return. Last prescription was 03/2016.  Call office 09/2016 for [facility] prescription.

## 2016-08-31 DIAGNOSIS — M79661 Pain in right lower leg: Secondary | ICD-10-CM | POA: Diagnosis not present

## 2016-08-31 DIAGNOSIS — Z9181 History of falling: Secondary | ICD-10-CM | POA: Diagnosis not present

## 2016-09-07 ENCOUNTER — Ambulatory Visit: Payer: Medicare Other

## 2016-09-08 DIAGNOSIS — M25562 Pain in left knee: Secondary | ICD-10-CM | POA: Diagnosis not present

## 2016-09-12 DIAGNOSIS — M25562 Pain in left knee: Secondary | ICD-10-CM | POA: Diagnosis not present

## 2016-09-14 DIAGNOSIS — M1712 Unilateral primary osteoarthritis, left knee: Secondary | ICD-10-CM | POA: Diagnosis not present

## 2016-09-14 DIAGNOSIS — M25462 Effusion, left knee: Secondary | ICD-10-CM | POA: Diagnosis not present

## 2016-09-14 DIAGNOSIS — M25562 Pain in left knee: Secondary | ICD-10-CM | POA: Diagnosis not present

## 2016-09-20 DIAGNOSIS — M1712 Unilateral primary osteoarthritis, left knee: Secondary | ICD-10-CM | POA: Diagnosis not present

## 2016-09-20 DIAGNOSIS — M79661 Pain in right lower leg: Secondary | ICD-10-CM | POA: Diagnosis not present

## 2016-09-20 DIAGNOSIS — Z9181 History of falling: Secondary | ICD-10-CM | POA: Diagnosis not present

## 2016-09-21 ENCOUNTER — Ambulatory Visit: Payer: Medicare Other

## 2016-09-27 DIAGNOSIS — N3941 Urge incontinence: Secondary | ICD-10-CM | POA: Diagnosis not present

## 2016-09-27 DIAGNOSIS — R351 Nocturia: Secondary | ICD-10-CM | POA: Diagnosis not present

## 2016-09-27 DIAGNOSIS — M1712 Unilateral primary osteoarthritis, left knee: Secondary | ICD-10-CM | POA: Diagnosis not present

## 2016-09-27 DIAGNOSIS — M25562 Pain in left knee: Secondary | ICD-10-CM | POA: Diagnosis not present

## 2016-09-28 DIAGNOSIS — M1712 Unilateral primary osteoarthritis, left knee: Secondary | ICD-10-CM | POA: Diagnosis not present

## 2016-09-28 DIAGNOSIS — Z9181 History of falling: Secondary | ICD-10-CM | POA: Diagnosis not present

## 2016-09-28 DIAGNOSIS — M79661 Pain in right lower leg: Secondary | ICD-10-CM | POA: Diagnosis not present

## 2016-09-29 ENCOUNTER — Ambulatory Visit (INDEPENDENT_AMBULATORY_CARE_PROVIDER_SITE_OTHER): Payer: Medicare Other

## 2016-09-29 DIAGNOSIS — M810 Age-related osteoporosis without current pathological fracture: Secondary | ICD-10-CM | POA: Diagnosis not present

## 2016-09-29 MED ORDER — DENOSUMAB 60 MG/ML ~~LOC~~ SOLN
60.0000 mg | Freq: Once | SUBCUTANEOUS | Status: AC
  Administered 2016-09-29: 60 mg via SUBCUTANEOUS

## 2016-09-29 NOTE — Progress Notes (Signed)
prolia Injection given.   Kayla Scadden J [name], MD  

## 2016-09-30 ENCOUNTER — Ambulatory Visit (INDEPENDENT_AMBULATORY_CARE_PROVIDER_SITE_OTHER): Payer: Medicare Other | Admitting: Gynecology

## 2016-09-30 ENCOUNTER — Encounter: Payer: Self-pay | Admitting: Gynecology

## 2016-09-30 VITALS — BP 114/74 | Ht 60.0 in | Wt 153.0 lb

## 2016-09-30 DIAGNOSIS — Z8543 Personal history of malignant neoplasm of ovary: Secondary | ICD-10-CM

## 2016-09-30 DIAGNOSIS — Z01419 Encounter for gynecological examination (general) (routine) without abnormal findings: Secondary | ICD-10-CM | POA: Diagnosis not present

## 2016-09-30 DIAGNOSIS — Z9289 Personal history of other medical treatment: Secondary | ICD-10-CM | POA: Diagnosis not present

## 2016-09-30 DIAGNOSIS — Z9189 Other specified personal risk factors, not elsewhere classified: Secondary | ICD-10-CM

## 2016-09-30 DIAGNOSIS — M810 Age-related osteoporosis without current pathological fracture: Secondary | ICD-10-CM

## 2016-09-30 NOTE — Progress Notes (Signed)
Kayla Thompson 1935/05/30 [id number]   History:    81 y.o.  for annual gyn exam who is asymptomatic today.Patient with past history of osteoporosis her PCP has her currently on Prolia 60 mg subcutaneous every 6 months received an injection yesterday at her PCP office. They have been monitoring her calcium vitamin D and PTH levels. They have also been treating her for hypertension and type 2 diabetes as well as asthma. Also she's been treating her hypothyroidism.Patient had a colonoscopy in 2014 whereby benign polyps had been removed. Bone density study 2016 demonstrated the lowest T score was at the left femoral neck with a value of -2.7 indicating osteoporosis.  GYN history significant for the fact that patient had ovarian cancer patient stated was a stage I and she had a radical hysterectomy    Past medical history,surgical history, family history and social history were all reviewed and documented in the EPIC chart.  Gynecologic History No LMP recorded. Patient has had a hysterectomy. Contraception: status post hysterectomy Last Pap: 2016. Results were: normal Last mammogram: 2018. Results were: normal  Obstetric History OB History  Gravida Para Term Preterm AB Living  1 1       1   SAB TAB Ectopic Multiple Live Births               # Outcome Date GA Lbr Len/2nd Weight Sex Delivery Anes PTL Lv  1 Para     F Vag-Spont          ROS: A ROS was performed and pertinent positives and negatives are included in the history.  GENERAL: No fevers or chills. HEENT: No change in vision, no earache, sore throat or sinus congestion. NECK: No pain or stiffness. CARDIOVASCULAR: No chest pain or pressure. No palpitations. PULMONARY: No shortness of breath, cough or wheeze. GASTROINTESTINAL: No abdominal pain, nausea, vomiting or diarrhea, melena or bright red blood per rectum. GENITOURINARY: No urinary frequency, urgency, hesitancy or dysuria. MUSCULOSKELETAL: No joint or muscle pain, no back pain,  no recent trauma. DERMATOLOGIC: No rash, no itching, no lesions. ENDOCRINE: No polyuria, polydipsia, no heat or cold intolerance. No recent change in weight. HEMATOLOGICAL: No anemia or easy bruising or bleeding. NEUROLOGIC: No headache, seizures, numbness, tingling or weakness. PSYCHIATRIC: No depression, no loss of interest in normal activity or change in sleep pattern.     Exam: chaperone present  BP 114/74   Ht 5' (1.524 m)   Wt 153 lb (69.4 kg)   BMI 29.88 kg/m   Body mass index is 29.88 kg/m.  General appearance : Well developed well nourished female. No acute distress HEENT: Eyes: no retinal hemorrhage or exudates,  Neck supple, trachea midline, no carotid bruits, no thyroidmegaly Lungs: Clear to auscultation, no rhonchi or wheezes, or rib retractions  Heart: Regular rate and rhythm, no murmurs or gallops Breast:Examined in sitting and supine position were symmetrical in appearance, no palpable masses or tenderness,  no skin retraction, no nipple inversion, no nipple discharge, no skin discoloration, no axillary or supraclavicular lymphadenopathy Abdomen: no palpable masses or tenderness, no rebound or guarding Extremities: no edema or skin discoloration or tenderness  Pelvic:  Bartholin, Urethra, Skene Glands: Within normal limits             Vagina: No gross lesions or discharge, atrophic changes  Cervix: Absent  Uterus  absent  Adnexa  Without masses or tenderness  Anus and perineum  normal   Rectovaginal  normal sphincter tone without palpated masses or  tenderness             Hemoccult PCP provides     Assessment/Plan:  81 y.o. female for annual exam with past history of ovarian cancer according to patient it was a stage I and she had a radical hysterectomy and had received chemotherapy in [place] with no evidence of recurrent disease. Patient with a small epidermal inclusion cysts of left groin area patient was reassured. Pap smear no longer needed. Patient scheduled  to see her PCP later this month will be scheduling her bone density study.    Terrance Mass MD, 3:30 PM 09/30/2016

## 2016-10-04 DIAGNOSIS — M1712 Unilateral primary osteoarthritis, left knee: Secondary | ICD-10-CM | POA: Diagnosis not present

## 2016-10-05 DIAGNOSIS — M1712 Unilateral primary osteoarthritis, left knee: Secondary | ICD-10-CM | POA: Diagnosis not present

## 2016-10-05 DIAGNOSIS — M79661 Pain in right lower leg: Secondary | ICD-10-CM | POA: Diagnosis not present

## 2016-10-05 DIAGNOSIS — Z9181 History of falling: Secondary | ICD-10-CM | POA: Diagnosis not present

## 2016-10-07 DIAGNOSIS — F419 Anxiety disorder, unspecified: Secondary | ICD-10-CM | POA: Diagnosis not present

## 2016-10-07 DIAGNOSIS — F331 Major depressive disorder, recurrent, moderate: Secondary | ICD-10-CM | POA: Diagnosis not present

## 2016-10-08 DIAGNOSIS — M79661 Pain in right lower leg: Secondary | ICD-10-CM | POA: Diagnosis not present

## 2016-10-08 DIAGNOSIS — Z9181 History of falling: Secondary | ICD-10-CM | POA: Diagnosis not present

## 2016-10-08 DIAGNOSIS — M1712 Unilateral primary osteoarthritis, left knee: Secondary | ICD-10-CM | POA: Diagnosis not present

## 2016-10-11 DIAGNOSIS — H00024 Hordeolum internum left upper eyelid: Secondary | ICD-10-CM | POA: Diagnosis not present

## 2016-10-14 ENCOUNTER — Ambulatory Visit (INDEPENDENT_AMBULATORY_CARE_PROVIDER_SITE_OTHER): Payer: Medicare Other | Admitting: Internal Medicine

## 2016-10-14 ENCOUNTER — Encounter: Payer: Self-pay | Admitting: Internal Medicine

## 2016-10-14 VITALS — BP 122/80 | HR 104 | Ht 60.0 in | Wt 150.0 lb

## 2016-10-14 DIAGNOSIS — E559 Vitamin D deficiency, unspecified: Secondary | ICD-10-CM | POA: Diagnosis not present

## 2016-10-14 DIAGNOSIS — Z794 Long term (current) use of insulin: Secondary | ICD-10-CM

## 2016-10-14 DIAGNOSIS — E1165 Type 2 diabetes mellitus with hyperglycemia: Secondary | ICD-10-CM | POA: Diagnosis not present

## 2016-10-14 LAB — POCT GLYCOSYLATED HEMOGLOBIN (HGB A1C): Hemoglobin A1C: 6.7

## 2016-10-14 MED ORDER — INSULIN GLARGINE 100 UNIT/ML SOLOSTAR PEN: PEN_INJECTOR | SUBCUTANEOUS | 5 refills | Status: DC

## 2016-10-14 MED ORDER — GLUCOSE BLOOD VI STRP: ORAL_STRIP | 3 refills | Status: DC

## 2016-10-14 MED ORDER — INSULIN LISPRO 100 UNIT/ML (KWIKPEN): PEN_INJECTOR | SUBCUTANEOUS | 5 refills | Status: DC

## 2016-10-14 MED ORDER — FREESTYLE LANCETS MISC: 3 refills | Status: DC

## 2016-10-14 NOTE — Patient Instructions (Addendum)
Please stop at the lab.  Please continue: - Lantus 20 units in am. - Humalog 4-6 units once a day, before b'fast.  Check sugars 1x a day, rotating check times.  Please come back for a follow-up appointment in 6 months.

## 2016-10-14 NOTE — Progress Notes (Signed)
Patient ID: Kayla Thompson, female   DOB: [birth year], 81 y.o.   MRN: [id number]   HPI  Kayla Thompson is a 81 y.o.-year-old female, initially referred by her OB/GYN doctor, Dr. Uvaldo Rising, for evaluation for hyperparathyroidism + normo/hypo-calcemia, vitamin D deficiency,  and DM2, dx 2010, insulin-dependent since 2010, controlled, w/o long-term complications. Last visit 1 year and 4 months ago.  She fell x 1 since last visit. Pain in L knee (tear in medial meniscus) >> getting gel inj's >> they do no work.  Reviewed and addended hx: Pt was dx with hyperparathyroidism in 08/2014.Pertinent labs: Lab Results  Component Value Date   PTH 37 06/09/2015   PTH Comment 06/09/2015   PTH 73 (H) 10/28/2014   PTH Comment 10/28/2014   PTH 190 (H) 09/10/2014   PTH 243 (H) 08/23/2014   CALCIUM 9.3 07/15/2016   CALCIUM 9.2 10/21/2015   CALCIUM 9.2 06/09/2015   CALCIUM 9.4 03/03/2015   CALCIUM 9.7 01/07/2015   CALCIUM 8.7 10/28/2014   CALCIUM 9.2 08/23/2014   CALCIUM 8.0 (L) 08/02/2014   Of note, patient has osteoporosis, dx in 2007.  I reviewed pt's DEXA scans - last from 07/25/2014: Date L1-L2 (L3 and L4 excluded) T score FN T score  07/25/2014   -0.1  LFN: -2.7   She has a history of multiple fractures: - 09/23/1990: pelvic fx - 02/2005: R hip fx - rod - 05/2006: rib fx - 12/2008: 3 R ankle fx - in [place] - plate and screws - 40/9811: wrist fx's 01/2012: steroid inj's R knee - 07/2014: forearm fx and nasal bone fx  She continues Prolia - started ~3 years.  No h/o kidney stones.  No h/o CKD. Last BUN/Cr: Lab Results  Component Value Date   BUN 15 07/15/2016   CREATININE 0.86 07/15/2016   Pt is not on HCTZ.  Patient continues on Caltrate 600 mg 2x a day and vitamin D 4000 units daily. Had R knee replacement.  Pt does not have a FH of hypercalcemia, pituitary tumors, thyroid cancer, or osteoporosis.   Latest vit D normal: Lab Results  Component Value Date   VD25OH 81.19  06/09/2015   VD25OH 65.60 01/28/2015   VD25OH 48 08/23/2014   A 24h urine collection to check urinary calcium >> Normal: Component     Latest Ref Rng 03/31/2015  Calcium, Ur     Not estab mg/dL 9  Calcium, 24 hour urine     35 - 250 mg/24 h 108  Creatinine, Urine     20 - 320 mg/dL 78  Creatinine, 24H Ur     0.63 - 2.50 g/24 h 0.94    She also has DM2: - dx 20101 >> Started insulin at diagnosis  Lab Results  Component Value Date   HGBA1C 6.9 (H) 07/15/2016   HGBA1C 7.2 (H) 10/21/2015   HGBA1C 6.3 06/09/2015   She is on: - Lantus 30 >> 25 >> 20 units in am - Humalog 4-6 units 1x a day (in am)  She checks sugars 1x a day: - am:  61-119, 140 >> 46, 80-121, 138 >> 102-112 - after lunch: 120 >> 116-154 >> n/c  - after dinner: 132 >> 70 >> n/c - bedtime: 142, 228 >> 97, 116 >> 60 >> n/c Lowest: 92. Highest: 189.  - Latest lipids:  Lab Results  Component Value Date   CHOL 219 (H) 07/15/2016   HDL 68.60 07/15/2016   LDLCALC 113 (H) 07/15/2016   TRIG 187.0 (H) 07/15/2016  CHOLHDL 3 07/15/2016  On Crestor 10.  No CKD. Latest BUN/Cr: Lab Results  Component Value Date   BUN 15 07/15/2016   Lab Results  Component Value Date   CREATININE 0.86 07/15/2016   Last dilated eye exam: 2018. No DR. Dr Gershon Crane. She will have cataract sx. She has a chalazion.  She also has a history of Ovarian cancer - 1996, urinary incontinence, HTN, HL, hypothyroidism - last TSH reviewed >> normal: Lab Results  Component Value Date   TSH 0.61 07/15/2016   ROS: Constitutional: no weight gain/no weight loss, + fatigue, no subjective hyperthermia, no subjective hypothermia Eyes: + blurry vision, no xerophthalmia ENT: no sore throat, no nodules palpated in throat, no dysphagia, no odynophagia, + hoarseness Cardiovascular: no CP/+ SOB/no palpitations/no leg swelling Respiratory: no cough/+ SOB/no wheezing Gastrointestinal: no N/no V/no D/no C/no acid reflux Musculoskeletal: + muscle aches/+  joint aches Skin: no rashes, no hair loss Neurological: + tremors/no numbness/no tingling/no dizziness, + HA  I reviewed pt's medications, allergies, PMH, social hx, family hx, and changes were documented in the history of present illness. Otherwise, unchanged from my initial visit note.  Past Medical History:  Diagnosis Date  . Arthritis   . Asthma   . Diabetes mellitus without complication ([place])   . Fibromyalgia   . GERD (gastroesophageal reflux disease)   . History of chemotherapy   . History of fractured pelvis   . Hypertension   . Osteoporosis   . Osteoporosis   . Ovarian cancer [facility]    Past Surgical History:  Procedure Laterality Date  . ABDOMINAL HYSTERECTOMY  1996  . ANKLE FRACTURE SURGERY     plate and screws  . BLADDER SUSPENSION    . hip surgey    . knee surgey    . LUNG SURGERY    . OOPHORECTOMY     BSO   History   Social History  . Marital Status: Widowed    Spouse Name: N/A  . Number of Children: 2 (1 adopted)   Occupational History  . retired   Social History Main Topics  . Smoking status: Never Smoker   . Smokeless tobacco: Never Used  . Alcohol Use: No  . Drug Use: No   Current Outpatient Prescriptions on File Prior to Visit  Medication Sig Dispense Refill  . albuterol (PROVENTIL HFA;VENTOLIN HFA) 108 (90 Base) MCG/ACT inhaler Inhale 2 puffs into the lungs every 6 (six) hours as needed for wheezing or shortness of breath. 1 Inhaler 11  . albuterol (PROVENTIL) (2.5 MG/3ML) 0.083% nebulizer solution Take 3 mLs (2.5 mg total) by nebulization daily. 270 mL 3  . BD PEN NEEDLE NANO U/F 32G X 4 MM MISC USE 4 TIMES A DAY 400 each 1  . carvedilol (COREG) 6.25 MG tablet Take 3.125 mg by mouth 2 (two) times daily with a meal.     . denosumab (PROLIA) 60 MG/ML SOLN injection Inject 60 mg into the skin every 6 (six) months. Administer in upper arm, thigh, or abdomen    . Dexlansoprazole 30 MG capsule Take 1 capsule (30 mg total) by mouth daily. 90 capsule 2   . Dextromethorphan-Guaifenesin (MUCINEX DM MAXIMUM STRENGTH) 60-1200 MG TB12 Take 1 tablet by mouth 2 (two) times daily as needed (for cough/phlegm). Reported on 06/23/2015    . donepezil (ARICEPT) 5 MG tablet TAKE 1 TABLET (5 MG TOTAL) BY MOUTH AT BEDTIME. 90 tablet 1  . DULoxetine (CYMBALTA) 60 MG capsule Take 60 mg by mouth daily.    Marland Kitchen  fluticasone (FLONASE) 50 MCG/ACT nasal spray Place 1-2 sprays into both nostrils daily as needed for allergies or rhinitis. 16 g 6  . Fluticasone-Salmeterol (ADVAIR DISKUS) 500-50 MCG/DOSE AEPB Inhale 1 puff into the lungs 2 (two) times daily. 3 each 3  . glucose blood (FREESTYLE TEST STRIPS) test strip Use to test blood sugar 3 times daily. Dx: E11.65 100 each 3  . insulin lispro (HUMALOG KWIKPEN) 100 UNIT/ML KiwkPen Inject 4-6 units into the skin daily. 5 pen 1  . Lancets (FREESTYLE) lancets Use to test blood sugar 3 times daily. Dx: E11.65 100 each 3  . LANTUS SOLOSTAR 100 UNIT/ML Solostar Pen INJECT 30 UNITS SUBCUTANEOUSLY EVERY MORNING 15 pen 0  . levothyroxine (SYNTHROID, LEVOTHROID) 50 MCG tablet Take 1 tablet (50 mcg total) by mouth daily before breakfast. 90 tablet 1  . montelukast (SINGULAIR) 10 MG tablet TAKE 1 TABLET (10 MG TOTAL) BY MOUTH DAILY. 90 tablet 3  . NON FORMULARY 1 Syringe by Epidural route every 3 (three) months. Epidural Steroid Injections for Lumbar Spinal Stenosis up to 4 times a year    . oxyCODONE-acetaminophen (PERCOCET/ROXICET) 5-325 MG tablet Take 1 tablet by mouth 2 (two) times daily as needed for severe pain. 45 tablet 0  . predniSONE (DELTASONE) 1 MG tablet Take 2 mg by mouth daily with breakfast.     . pregabalin (LYRICA) 300 MG capsule Take 1 capsule (300 mg total) by mouth 2 (two) times daily. 180 capsule 1  . ranitidine (ZANTAC) 150 MG tablet Take 150 mg by mouth 2 (two) times daily as needed for heartburn.    . rosuvastatin (CRESTOR) 10 MG tablet Take 1 tablet (10 mg total) by mouth every evening. 90 tablet 2  . tiotropium  (SPIRIVA HANDIHALER) 18 MCG inhalation capsule Place 1 capsule (18 mcg total) into inhaler and inhale daily. 30 capsule 5  . zolpidem (AMBIEN) 5 MG tablet Take 1 tablet (5 mg total) by mouth at bedtime. 30 tablet 2   No current facility-administered medications on file prior to visit.    Allergies  Allergen Reactions  . Latex Rash  . Penicillins Rash   Family History  Problem Relation Age of Onset  . Lung cancer Mother   . CAD Father   . Diabetes Father   . Lung cancer Maternal Aunt   . Diabetes Paternal Aunt   . Diabetes Paternal Uncle   . Diabetes Paternal Grandmother   . Diabetes Paternal Grandfather    PE: BP 122/80 (BP Location: Left Arm, Patient Position: Sitting)   Pulse (!) 104   Ht 5' (1.524 m)   Wt 150 lb (68 kg)   SpO2 95%   BMI 29.29 kg/m  Body mass index is 29.29 kg/m. Wt Readings from Last 3 Encounters:  10/14/16 150 lb (68 kg)  09/30/16 153 lb (69.4 kg)  08/30/16 149 lb (67.6 kg)   Constitutional: overweight, in NAD, has a walker Eyes: PERRLA, EOMI, no exophthalmos ENT: moist mucous membranes, no thyromegaly, no cervical lymphadenopathy Cardiovascular: RRR, No MRG Respiratory: CTA B Gastrointestinal: abdomen soft, NT, ND, BS+ Musculoskeletal: no deformities, strength intact in all 4 Skin: moist, warm, no rashes Neurological: + tremor with outstretched hands, DTR normal in all 4  Assessment: 1. H/o Hyperparathyroidism - recent normocalcemia  2. Vitamin D def  3. DM2, insulin-dependent, controlled, without complications  Plan: 1. HPTH and 2. Vit D def - pt with h/o high parathyroid hormone, in the setting of low calcium initially, which normalized subsequently.  Both set  of labs were drawn apparently after Prolia infusion, which can lower calcium levels. A 24 h UCa was normal. No h/o hypercalcemia. She continues calcium (600 mg 2x a day) and vit D supplements (4000 IU daily).  - at last visit, her PTH was normal and calcium was also normal. A  calcium level was again normal 3 mo ago. Will not recheck. - will continue to follow calcium but will not check PTH levels unless Calcium starts rising - will check vit D today. For now, continue current dose.  3. DM2 - Patient with long standing, controlled DM now, on asal-bolus regimen, with good control and no more low CBGs after we decreased Lantus to 20 units.will decrease Lantus to 20 units. She is taking NovoLog only once a day but this appears to be enough. Today, HbA1c is 6.7% (Great!) - I advised her to  Patient Instructions  Please stop at the lab.  Please continue: - Lantus 20 units in am. - NovoLog 4-6 units once a day, before b'fast.  Check sugars 1x a day, rotating check times.  Please come back for a follow-up appointment in 3 months.  - continue checking sugars at different times of the day - check 1-2x a day, rotating checks - advised for yearly eye exams >> she is UTD - Return to clinic in 3 mo with sugar log   Pt did not stop at the lab >> will check vit D at next visit...  Philemon Kingdom, MD PhD [facility] Endocrinology

## 2016-10-18 DIAGNOSIS — M1712 Unilateral primary osteoarthritis, left knee: Secondary | ICD-10-CM | POA: Diagnosis not present

## 2016-10-18 DIAGNOSIS — Z9181 History of falling: Secondary | ICD-10-CM | POA: Diagnosis not present

## 2016-10-18 DIAGNOSIS — M79661 Pain in right lower leg: Secondary | ICD-10-CM | POA: Diagnosis not present

## 2016-10-21 DIAGNOSIS — M79661 Pain in right lower leg: Secondary | ICD-10-CM | POA: Diagnosis not present

## 2016-10-21 DIAGNOSIS — Z9181 History of falling: Secondary | ICD-10-CM | POA: Diagnosis not present

## 2016-10-21 DIAGNOSIS — N3941 Urge incontinence: Secondary | ICD-10-CM | POA: Diagnosis not present

## 2016-10-21 DIAGNOSIS — R350 Frequency of micturition: Secondary | ICD-10-CM | POA: Diagnosis not present

## 2016-10-21 DIAGNOSIS — M1712 Unilateral primary osteoarthritis, left knee: Secondary | ICD-10-CM | POA: Diagnosis not present

## 2016-10-25 DIAGNOSIS — F331 Major depressive disorder, recurrent, moderate: Secondary | ICD-10-CM | POA: Diagnosis not present

## 2016-10-25 DIAGNOSIS — F419 Anxiety disorder, unspecified: Secondary | ICD-10-CM | POA: Diagnosis not present

## 2016-10-28 DIAGNOSIS — M79661 Pain in right lower leg: Secondary | ICD-10-CM | POA: Diagnosis not present

## 2016-10-28 DIAGNOSIS — N3941 Urge incontinence: Secondary | ICD-10-CM | POA: Diagnosis not present

## 2016-10-28 DIAGNOSIS — Z9181 History of falling: Secondary | ICD-10-CM | POA: Diagnosis not present

## 2016-10-28 DIAGNOSIS — M1712 Unilateral primary osteoarthritis, left knee: Secondary | ICD-10-CM | POA: Diagnosis not present

## 2016-11-01 DIAGNOSIS — M79661 Pain in right lower leg: Secondary | ICD-10-CM | POA: Diagnosis not present

## 2016-11-01 DIAGNOSIS — Z9181 History of falling: Secondary | ICD-10-CM | POA: Diagnosis not present

## 2016-11-01 DIAGNOSIS — M1712 Unilateral primary osteoarthritis, left knee: Secondary | ICD-10-CM | POA: Diagnosis not present

## 2016-11-04 DIAGNOSIS — N3941 Urge incontinence: Secondary | ICD-10-CM | POA: Diagnosis not present

## 2016-11-04 DIAGNOSIS — Z9181 History of falling: Secondary | ICD-10-CM | POA: Diagnosis not present

## 2016-11-04 DIAGNOSIS — M1712 Unilateral primary osteoarthritis, left knee: Secondary | ICD-10-CM | POA: Diagnosis not present

## 2016-11-04 DIAGNOSIS — M79661 Pain in right lower leg: Secondary | ICD-10-CM | POA: Diagnosis not present

## 2016-11-08 DIAGNOSIS — M79661 Pain in right lower leg: Secondary | ICD-10-CM | POA: Diagnosis not present

## 2016-11-08 DIAGNOSIS — M1712 Unilateral primary osteoarthritis, left knee: Secondary | ICD-10-CM | POA: Diagnosis not present

## 2016-11-08 DIAGNOSIS — Z9181 History of falling: Secondary | ICD-10-CM | POA: Diagnosis not present

## 2016-11-09 DIAGNOSIS — N3941 Urge incontinence: Secondary | ICD-10-CM | POA: Diagnosis not present

## 2016-11-11 ENCOUNTER — Ambulatory Visit: Payer: Medicare Other | Admitting: Pulmonary Disease

## 2016-11-11 DIAGNOSIS — L570 Actinic keratosis: Secondary | ICD-10-CM | POA: Diagnosis not present

## 2016-11-15 DIAGNOSIS — F419 Anxiety disorder, unspecified: Secondary | ICD-10-CM | POA: Diagnosis not present

## 2016-11-15 DIAGNOSIS — F331 Major depressive disorder, recurrent, moderate: Secondary | ICD-10-CM | POA: Diagnosis not present

## 2016-11-18 ENCOUNTER — Ambulatory Visit: Payer: Medicare Other | Admitting: Pulmonary Disease

## 2016-11-18 DIAGNOSIS — R350 Frequency of micturition: Secondary | ICD-10-CM | POA: Diagnosis not present

## 2016-11-18 DIAGNOSIS — N3941 Urge incontinence: Secondary | ICD-10-CM | POA: Diagnosis not present

## 2016-11-21 ENCOUNTER — Other Ambulatory Visit: Payer: Self-pay | Admitting: Internal Medicine

## 2016-11-23 DIAGNOSIS — D485 Neoplasm of uncertain behavior of skin: Secondary | ICD-10-CM | POA: Diagnosis not present

## 2016-11-26 DIAGNOSIS — L720 Epidermal cyst: Secondary | ICD-10-CM | POA: Diagnosis not present

## 2016-12-01 ENCOUNTER — Encounter: Payer: Self-pay | Admitting: Pulmonary Disease

## 2016-12-01 ENCOUNTER — Ambulatory Visit (INDEPENDENT_AMBULATORY_CARE_PROVIDER_SITE_OTHER): Payer: Medicare Other | Admitting: Pulmonary Disease

## 2016-12-01 ENCOUNTER — Other Ambulatory Visit (INDEPENDENT_AMBULATORY_CARE_PROVIDER_SITE_OTHER): Payer: Medicare Other

## 2016-12-01 ENCOUNTER — Encounter: Payer: Self-pay | Admitting: Internal Medicine

## 2016-12-01 ENCOUNTER — Ambulatory Visit (INDEPENDENT_AMBULATORY_CARE_PROVIDER_SITE_OTHER): Payer: Medicare Other | Admitting: Internal Medicine

## 2016-12-01 VITALS — BP 128/74 | HR 60 | Temp 97.7°F | Ht 60.0 in | Wt 144.0 lb

## 2016-12-01 VITALS — BP 130/78 | HR 81 | Temp 98.3°F | Ht 60.0 in | Wt 144.0 lb

## 2016-12-01 DIAGNOSIS — E039 Hypothyroidism, unspecified: Secondary | ICD-10-CM

## 2016-12-01 DIAGNOSIS — Z794 Long term (current) use of insulin: Secondary | ICD-10-CM

## 2016-12-01 DIAGNOSIS — M353 Polymyalgia rheumatica: Secondary | ICD-10-CM

## 2016-12-01 DIAGNOSIS — R0902 Hypoxemia: Secondary | ICD-10-CM | POA: Diagnosis not present

## 2016-12-01 DIAGNOSIS — M818 Other osteoporosis without current pathological fracture: Secondary | ICD-10-CM | POA: Diagnosis not present

## 2016-12-01 DIAGNOSIS — I5032 Chronic diastolic (congestive) heart failure: Secondary | ICD-10-CM

## 2016-12-01 DIAGNOSIS — J479 Bronchiectasis, uncomplicated: Secondary | ICD-10-CM

## 2016-12-01 DIAGNOSIS — M150 Primary generalized (osteo)arthritis: Secondary | ICD-10-CM | POA: Diagnosis not present

## 2016-12-01 DIAGNOSIS — M797 Fibromyalgia: Secondary | ICD-10-CM

## 2016-12-01 DIAGNOSIS — Z23 Encounter for immunization: Secondary | ICD-10-CM

## 2016-12-01 DIAGNOSIS — E1165 Type 2 diabetes mellitus with hyperglycemia: Secondary | ICD-10-CM | POA: Diagnosis not present

## 2016-12-01 DIAGNOSIS — I10 Essential (primary) hypertension: Secondary | ICD-10-CM

## 2016-12-01 DIAGNOSIS — K219 Gastro-esophageal reflux disease without esophagitis: Secondary | ICD-10-CM

## 2016-12-01 DIAGNOSIS — E538 Deficiency of other specified B group vitamins: Secondary | ICD-10-CM

## 2016-12-01 DIAGNOSIS — M8949 Other hypertrophic osteoarthropathy, multiple sites: Secondary | ICD-10-CM

## 2016-12-01 DIAGNOSIS — M159 Polyosteoarthritis, unspecified: Secondary | ICD-10-CM

## 2016-12-01 LAB — COMPREHENSIVE METABOLIC PANEL
ALT: 14 U/L (ref 0–35)
AST: 13 U/L (ref 0–37)
Albumin: 3.7 g/dL (ref 3.5–5.2)
Alkaline Phosphatase: 30 U/L — ABNORMAL LOW (ref 39–117)
BUN: 15 mg/dL (ref 6–23)
CO2: 30 mEq/L (ref 19–32)
Calcium: 9.3 mg/dL (ref 8.4–10.5)
Chloride: 103 mEq/L (ref 96–112)
Creatinine, Ser: 0.98 mg/dL (ref 0.40–1.20)
GFR: 58.12 mL/min — ABNORMAL LOW (ref >60.00)
Glucose, Bld: 110 mg/dL — ABNORMAL HIGH (ref 70–99)
Potassium: 4.2 mEq/L (ref 3.5–5.1)
Sodium: 142 mEq/L (ref 135–145)
Total Bilirubin: 0.3 mg/dL (ref 0.2–1.2)
Total Protein: 6.2 g/dL (ref 6.0–8.3)

## 2016-12-01 LAB — CBC
HCT: 38.5 % (ref 36.0–46.0)
Hemoglobin: 12.3 g/dL (ref 12.0–15.0)
MCHC: 32 g/dL (ref 30.0–36.0)
MCV: 96.8 fl (ref 78.0–100.0)
Platelets: 326 10*3/uL (ref 150.0–400.0)
RBC: 3.98 Mil/uL (ref 3.87–5.11)
RDW: 15.5 % (ref 11.5–15.5)
WBC: 11.5 10*3/uL — ABNORMAL HIGH (ref 4.0–10.5)

## 2016-12-01 LAB — VITAMIN B12: Vitamin B-12: >1500 pg/mL — ABNORMAL HIGH (ref 211–911)

## 2016-12-01 LAB — T4, FREE: Free T4: 0.82 ng/dL (ref 0.60–1.60)

## 2016-12-01 LAB — TSH: TSH: 0.23 u[IU]/mL — ABNORMAL LOW (ref 0.35–4.50)

## 2016-12-01 MED ORDER — OXYCODONE-ACETAMINOPHEN 5-325 MG PO TABS: 1.0000 | ORAL_TABLET | Freq: Two times a day (BID) | ORAL | 0 refills | Status: DC | PRN

## 2016-12-01 MED ORDER — ZOLPIDEM TARTRATE 5 MG PO TABS: 5.0000 mg | ORAL_TABLET | Freq: Every day | ORAL | 2 refills | Status: DC

## 2016-12-01 MED ORDER — FLUTICASONE PROPIONATE 50 MCG/ACT NA SUSP: 1.0000 | Freq: Every day | NASAL | 6 refills | Status: DC | PRN

## 2016-12-01 MED ORDER — APOAEQUORIN 10 MG PO CAPS: 10.0000 | ORAL_CAPSULE | Freq: Every day | ORAL | 3 refills | Status: DC

## 2016-12-01 NOTE — Assessment & Plan Note (Signed)
She will continue the lyrica 300 mg BID for pain. The tremor is not noticeable on exam and she is not bothered by it enough to change.

## 2016-12-01 NOTE — Patient Instructions (Addendum)
Today we updated your med list in our EPIC system...    Continue your current medications the same...  Remember to use your NEBULIZER w/ Duoneb medication 3 times daily (Morn, Mid-day, Eve)...  Follow these treatments w/ your ADVAIR (morn & eve) and the SPIRIVA (mid-day)...  Stay as active as possible & use your portable oxygen at 2L/min flow while walking & exercising...  Call for any questions...  Let's plan a follow up visit in 61mo, sooner if needed for problems.Marland KitchenMarland Kitchen

## 2016-12-01 NOTE — Assessment & Plan Note (Signed)
BP at goal on her coreg. No problems with lightheaded or dizziness.

## 2016-12-01 NOTE — Assessment & Plan Note (Signed)
Checking TSH and free t4 and adjust levothyroxine 50 mcg daily as needed.

## 2016-12-01 NOTE — Progress Notes (Signed)
   Subjective:    Patient ID: Kayla Thompson, female    DOB: 1935/07/13, 81 y.o.   MRN: [id number]  HPI The patient is a 81 YO female coming in for tremors that have been ongoing. She is concerned that her lyrica could be the cause. She is using lyrica for pain and it is working well. She is not wanting to change. Tremors are mild in the neck but not noticeable.  She is also wanting to change her aricept to prevagen as she has heard it works better. She is not having any side effects from aricept. She has not noticed much difference but a little bit better. She does have some problems with memory and short term mostly but she is not bothered by it.   She has had Dr Dema Severin [facility] eyelid ([address])  Review of Systems  Constitutional: Negative.   HENT: Negative.   Eyes: Negative.   Respiratory: Positive for shortness of breath. Negative for cough and chest tightness.        Chronic and stable  Cardiovascular: Negative for chest pain, palpitations and leg swelling.  Gastrointestinal: Negative for abdominal distention, abdominal pain, constipation, diarrhea, nausea and vomiting.  Musculoskeletal: Positive for arthralgias, back pain and myalgias.  Skin: Negative.   Neurological: Positive for numbness. Negative for dizziness, tremors, light-headedness and headaches.       Memory change  Psychiatric/Behavioral: Negative.       Objective:   Physical Exam  Constitutional: She is oriented to person, place, and time. She appears well-developed and well-nourished.  HENT:  Head: Normocephalic and atraumatic.  Eyes: EOM are normal.  Neck: Normal range of motion.  Cardiovascular: Normal rate and regular rhythm.   Pulmonary/Chest: Effort normal and breath sounds normal. No respiratory distress. She has no wheezes. She has no rales.  Abdominal: Soft. Bowel sounds are normal. She exhibits no distension. There is no tenderness. There is no rebound.  Musculoskeletal: She  exhibits no edema.  Neurological: She is alert and oriented to person, place, and time. Coordination normal.  Skin: Skin is warm and dry.  Psychiatric: She has a normal mood and affect.   Vitals:   12/01/16 0932  BP: 130/78  Pulse: 81  Temp: 98.3 F (36.8 C)  TempSrc: Oral  SpO2: 93%  Weight: 144 lb (65.3 kg)  Height: 5' (1.524 m)      Assessment & Plan:  Flu shot given at visit.

## 2016-12-01 NOTE — Patient Instructions (Addendum)
We will check the labs today and call you back with the results.   We have sent in the prevagen but as I mentioned it could be very expensive.

## 2016-12-01 NOTE — Progress Notes (Signed)
Subjective:     Patient ID: Kayla Thompson, female   DOB: Sep 26, 1935, 81 y.o.   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  ~  January 07, 2015:  Initial pulmonary consult w/ SN>      39 y/o WF, referred by [name]) for a pulmonary evaluation due to cough & dyspnea>  [place]alia moved to [place] about 41mo ago to be near her daughter, granddaughter, and great-grands after her husb passed away;  Shortly after arrival she fell & was [facility] w/ fx nasal bones and left wrist (distal radius & ulna) managed by Burnett Sheng summary reviewed... She established Primary Care w/ DrKollar, Naz[place], Endocrine- DrGherghe... She has a Hx of pulmonary problems identified by the Pt & records from [facility] as Bronchiectasis and Asthma;  Her CC is SOB/ DOE w/ min activity & it is apparent that she is rather sedentary and a poor historian; she notes mild cough, some yellow sputum, no hemoptysis; she denies CP, palpit, or signif cardiac history...  Smoking Hx>  She is a never smoker, but had signif 2nd hand exposure from her husb...  Pulmonary Hx>  She indicates hx left lung surg 2000 for benign granuloma; hx bronchiectasis but she does not know how the Dx was established; told asthma as well ever since early 2000's but not as a child or young adult; states she had pneumonia age 81 at same time as the measles (not hosp & made full recovery w/o apparent residual resp problems); she does admit to recurrent bronchitic infections as adult requiring antibiotics & occas Pred rx... She adds a rambling hx of work-ups at [facility] (in [place] in [place] but she does not know what this was for?       Notes from Dr. Mick Sell in [place]> Bronchiectasis, mult pulm nodules, & mild Asthma; prev pulm nodule resected in 2000 which proved to be a granuloma; he reported that an atypical  mycobacterium was found at the time of lung bx in the past- no further details provided; seen for cough w/ yellow sput & incr dyspnea- reported to be stable on Advair500Bid, Spiriva daily, Singulair10, NEB w/ Albut-Bid, Mucinex, VentolinHFA rescue prn; she was also on Pred 3mg /d for PMR from her Rheum;   Medical Hx>  Diastolic Dysfunction on 2DEcho, mild AS, HL, IDDM, Hypothyroid, GERD, DJD w/ TKR and ankle surg; Fibromyalgia on Lyrica/ Percocet/ Tramadol; PMR per Rheum in [place] on Pred2mg /d x 20yr (it was last dropped from 3mg  ~48yr ago); Osteoporosis; Anemia; Anxiety.   Family Hx>  Mother died from lung cancer (as did one of her sisters);  Father passed from heart disease.  Occup Hx>  No known occupational exposures  Current Meds>  SEE FULL MED LIST BELOW EXAM showed Afeb, VSS, O2sat=99% on RA;  Heent- neg, mallampati3;  Chest- decr BS bilat, not congested, w/o w/r/r heard;  Heart- RR gr1/6SEM w/o r/g detected;  Abd- soft, nontender, +panniculus;  Ext- VI w/o c/c/e...   CT Chest 11/17/09 in [place]> mult noncalcif pulm nodules bilat (stable when compared to older scans), mucoid impaction LUL & RML, pleuroparenchymal scarring right base (unchanged), no adenopathy, norm heart size & coronary calcif.  PFTs 02/04/10 in [place]> FVC=2.20 (95%), FEV1=1.42 (88%), %1sec=65, mid-flows reduced at 38% predicted; no improvement in FEV1 after bronchodil; TLC=4.70 (118%), RV=2.48 (153%), RV/TLC=53%; DLCO=54% predicted.  CXR 01/14/14 reported to show norm heart  size, clear lungs, thoracic spondylosis, NAD...   CXR 08/02/14 in ER here after fall showed norm heart size, diffuse interstitial coarsening w/o focal airsp dis...   2DEcho 08/02/14 in [place] after fall showed norm LVF w/ EF=55-60%, no regional wall motion abn, Gr1 DD, mild AS w/ mean gradient 48mmHg; mod calcif MV leaflets w/o stenosis or regurg; RV looked OK but could not est PAsys...  CXR here 01/07/15 showed norm heart size, incr AP diameter of chest & flat  diaphragms, incr markings diffusely, old right rib fxs, osteopenia...   Spirometry 01/07/15> we attempted mult trial of simple spirometry but pt could not generate an acceptable curve & results are not reliable...  Ambulatory oxygen saturation test 01/07/15> O2sat=98% on RA at rest w/ pulse=79/min; she ambulated 2 laps in office & stopped w/ dyspnea, lowest O2sat=87% w/ pulse=95/min  LABS 01/07/15>  Chems- wnl x BS=45;  CBC- Hg=11.0, MCV=87, WBC=16.3, diff ok;  Sed=52 IMP/PLAN>>  Complex 81 y/o woman:    Pulmonary> Hx COPD/ Bronchiectasis, Hx granulomatous lung dis & reported atyp mycobacterium in the past, ?other ILD- awaiting Hi-res CT Chest results; she has clinical hx asthma in the past but no reversibility on prev PFTs; REC to continue Advair500, Spiriva, NEBS, Singulair, Mucinex, Ventolin HFA for now; may need further ILD work-up w/ collagen-vasc screen QuantGold, sputum studies, etc... We will follow closely.    Cardiac> on Coreg6.25Bid for ?HBP; she has mild AS & DiastCHF on 2DEcho and this needs follow up...    Endocrine> IDDM, Hypothyroid, transiently elev serum calcium ?etiology (resolved spont); managed by DrGherghe; BS today is 45 & asked to eat & check w/ endocrine re- adjustment in Novolog...    GYN> Hx ovarian cancer (in remission- details unknown), s/p hyst    Ortho/ Rheum> DJD w/ TKR, FM on Lyrica etc, severe LBP requires epidural shots; PMR on Pred (details unknown); her Sed=52 and she will prob need Rheum to help sort this out...    Heme> Hg 10-11 range May2016 after fall w/ fx nose & wrist; similar now & may need further anemia work-up as well...  ADDENDUM>>  Hi-res CT Chest 01/14/15:  Norm heart size, atherosclerosis of Ao/ great vessels/ coronaries, no adenopathy, atrophic thyroid; ?small sliding pulm hernia betw the lat 7th & 8th ribs w/ scarring; extensive bronchiectasis bilat w/ mucoid impactions within dilated airways (infectious/inflamm bronchiolitis- r/o MAI), 1mm LLL  nodule, tracheobronchomalacia w/ extensive bilat air trapping on expiration, smallHH, old healed right 6th-7th rib fx, DJD Tspine...  REC> continue NEBS, Advair, Spiriva, Mucinex (maximize to 600mg  Qid w/ fluids), practice purse lip breathing to splint the airways open & aide in expectoration of phlegm, use FLUTTER valve & postural drainage to get the sput out; collect sput for routine C&S, AFB, Fungi => TC w/ pt: she has a lot going on, refuses further meds, refuses further eval, can't be bothered w/ learning Chest PT/ Flutter, postural drainage, purse lip breathing, collecting sputum specimen, etc; states she's had all this a long time & not interested in more aggressive pulmonary treatments at this time... She will f/u w/ me in November & we will discuss again.  ADDENDUM>>  Records from Dr. Mick Sell, Pulmonary in [place]=> note of 07/18/13:  Hx mild asthma & bronchiectasis, hx mult pulm nodules followed for several yrs & one removed surgically in 2000 that proved to be a granuloma & report of an atypical mycobacterium being found at the same time (but no details given), on NEB w/ Albuterol,  Advair, Spiriva, Singulair, Mucinex, low-dose Pred for PMR;  She was given all indicated vaccinations;  No change in her med regimen for yrs...   ~  February 19, 2015:  6wk ROV w/ SN>  SEE ABOVE-- pt notes nasal congestion, chest congestion, sl cough & incr SOB for the last wk or so; in the next breath she says "I seldom get a cold", thinks it's all allergies, notes great-grnads recently ill;  She has NEBS w/ Albut (only using once per day), Advair500Bid, Spiriva once daily, Mucinex (?how much), Pred?1mg - 3/d, Singulair10/ Zyrtek10...  Since she was here last she has followed up w/ PCP-DrCrawford 10/25> FM doing ok on Lyrica & she reported more functional & resting better...   She also had a f/u w/ Endocrine-DrGherghe 11/8> DM, osteoporosis, hypothyroid, & Hx hyperpara; Labs reviewed & ok (A1c=6.5 on her insulin,  Ca is wnl and vitD=66)...    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     Cardiac> on Coreg6.25Bid for ?HBP; she has mild AS & DiastCHF on 2DEcho and this needs follow up w/ her PCP vs Cards...    Endocrine> IDDM, Hypothyroid, transiently elev serum calcium ?etiology (resolved spont); managed by DrGherghe...    GYN> Hx ovarian cancer (in remission- details unknown), s/p hyst & Ca125=9 (08/2014)...    Ortho/ Rheum> DJD w/ TKR, FM on Lyrica, Tramadol, Percocet,etc, severe LBP requires epidural shots; PMR on Pred (details unknown); her Sed=52 and she will prob need Rheum to help sort this out...    Heme> Hg 10-11 range May2016 after fall w/ fx nose & wrist; similar now & may need further anemia work-up as well... EXAM showed Afeb, VSS, O2sat=93% on RA;  Wt=162#;  Heent- neg, mallampati3;  Chest- decr BS bilat, not congested, w/o w/r/r heard;  Heart- RR gr1/6SEM w/o r/g detected;  Abd- soft, nontender, +panniculus;  Ext- VI w/o c/c/e...   Pt given a neb treatment in office- unable to produce any sput for cultures... IMP/PLAN>>  We discussed Rx w/ ZPak for her URI (it could also help her if she has atyp-mycobacterium); we compromised rx w/ NEBS TID- followed by AdvairBid & Spiriva once daily, plus the MUCINEX1200mg Bid w/ fluids;  She again declines the added benefit from a vigorous chest PT regimen, flutter valve, postural drainage, purse lip breathing, etc... we plan a recheck in 3 months...  ~  June 23, 2015:  75mo ROV  w/ SN>  Alonna returns unaccompanied and states that her breathing is good- no problem she says; notes min cough, sm amt whitish sput (no color, no blood), admits to mild SOB/DOE but swears there is no progression, and denies CP/ f/c/s/ etc... She states that she was INTOL to ZPak given last OV but can't recall the reaction ("?didn't work for me"), doesn't want it again;  We reviewed her meds & again she is not being aggressive enough to effectively treat her bronchiectasis & mucoid impactions> eg. Only using the Albut NEBS prn & ave once Qod, only using the Mucinex 1200 once daily, she says she is using the ADVAIR500Bid, SPIRIVA once daily, SINGULAIR10, PRED1mg -2tabs  Qam ?per PCP/ ?Rheum... 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    Cardiac> on Coreg6.25- 1/2Bid for ?HBP; she has mild AS & DiastCHF on 2DEcho and this needs follow up w/ her PCP vs Cards...    Endocrine> IDDM on Lantus & Novolog (A1c=6.3), HL on Crestor10 (FLP- controlled), Hypothyroid on Synthroid50, transiently elev serum calcium ?etiology (resolved spont), osteoporosis w/ mult fxs on Prolia Q55mo; managed by DrGherghe & last seen 06/09/15- note reviewed...    GYN> Hx ovarian cancer (in remission- details unknown), s/p hyst & Ca125=9 (08/2014)...    Ortho/ Rheum> DJD w/ TKR, FM on Lyrica,  Tramadol, Percocet,etc, severe LBP requires epidural shots; she is on a huge dose of Lyrica; PMR on Pred (details unknown); her Sed=52 and she will prob need Rheum to help sort this out...    Neuro> on Aricept5 per DrCrawford, I wonder if she is taking any/ all of her meds properly, no care giver accompanies pt to office visits, she is ?stoic ?oblivious...    Heme> Hg 10-11 range May2016 after fall w/ fx nose & wrist; similar now & may need further anemia work-up as well, deferred to PCP... EXAM showed Afeb, VSS, O2sat=98% on RA;  Wt=161#;  Heent- neg, mallampati3;  Chest- decr BS bilat, not congested, w/o w/r/r heard;  Heart- RR gr1/6SEM w/o r/g detected;  Abd- soft, nontender, +panniculus;  Ext- VI w/o c/c/e...   LABS 2016:  Reviewed... IMP/PLAN>>  We again outlined for MrsNatter a vigorous home treatment regimen for her bronchiectasis & mucoid impactions including a TID regimen>  NEB w/ Albut vs Duoneb Tid, followed by her Advair500Bid & Spiriva once/d, Guaifenesin 400mg  tabs- 2Tid w/ fluids, take her low dose Pred in AM & Singulair in PM; she declines to do chest PT/ Flutter & postural drainage treatments...  ~  October 01, 2015:  42mo ROV & add-on appt requested by pt for COPD exac>  She reports that she went to a granddaughter's wedding in [place] 6/24, then spent 1wk in [place] and "caught a cold", didn't go to UC or see a physician, just "toughed it out", getting better slowly but symptoms lingering she says> head is woozy, nasal drainage, sl cough, white sput (no discoloration or blood), sl tight/ wheezy/ congested but no CP/ f/c/s, etc... She reminds me that "ZPak doesn't work for me", and she wants Doxy;  It is apparent that she is still NOT doing her Resp treatment regimen as I have prev requested & outlined for her (see below)- acts as if this is the first time she has heard this from me, she remains on Aricept5 from PCP & has not seen Neuro...we reviewed the following medical problems during today's office  visit >>     Pulmonary> Hx COPD/ Bronchiectasis/ Abn CT Chest w/ mucoid impactions, tracheobronchomalacia w/ air trapping, 10mm LLL nodule, Hx granulomatous lung dis & reported atyp mycobacterium in  the past>  See Hi-res CTChest 12/2014;  We encouraged a regular bronchodilator regimen (NEBS w/ Albut vs Duoneb Tid, ADVAIR500Bid, SPIRIVA daily), Guaifenesin 400mg -2Tid, Singulair10, Fluids, Flutter/ postural drainage/ purse-lip breathing exercises, & check sput for cultures but she could not be bothered & refused further pulm treatment/ testing sput/ etc=> still not cooperating w/ max chest physiotherapy regimen & states she only gets a sm amt of whitish sput (unable to successfully induce sput in office...    Cardiac> on Coreg6.25- 1/2Bid for ?HBP; she has mild AS & DiastCHF on 2DEcho and this needs follow up w/ her PCP vs Cards...    Endocrine> IDDM on Lantus & Novolog (A1c=6.3), HL on Crestor10 (FLP- controlled), Hypothyroid on Synthroid50, transiently elev serum calcium ?etiology (resolved spont), osteoporosis w/ mult fxs on Prolia Q64mo; managed by DrGherghe & last seen 06/09/15- note reviewed...    GYN> Hx ovarian cancer (in remission- details unknown), s/p hyst & Ca125=9 (08/2014)... She is followed by DrFernandez now...    Ortho/ Rheum> DJD w/ TKR, FM on Lyrica, Tramadol, Percocet,etc, severe LBP requires epidural shots; she is on a huge dose of Lyrica; PMR on Pred (details unknown); her Sed=52 and she saw [facility] for Rheum [facility] 09/03/15- severe OA, FM, PMR, & osteoporosis on Lyrica, Pred, Percocet, & Prolia; she felt worse when out of her Lyrica    Neuro> on Aricept5 per DrCrawford, I wonder if she is taking any/ all of her meds properly, no care giver accompanies pt to office visits, she is ?stoic ?oblivious...    Heme> Hg 10-11 range May2016 after fall w/ fx nose & wrist; similar now & may need further anemia work-up as well, deferred to PCP... EXAM showed Afeb, VSS, O2sat=96% on RA;  Wt=167#;  Heent-  neg, mallampati3;  Chest- decr BS bilat, not congested, w/o w/r/r heard;  Heart- RR gr1/6SEM w/o r/g detected;  Abd- soft, nontender, +panniculus;  Ext- VI w/o c/c/e...   CXR 11/04/15 showedCOPD, scarring in right mid lung, chr changes and NAD... IMP/PLAN>>  Berneice has a URI & mild COPD exac=> we will treat w/ Doxy100Bid (her request) and Medrol dosepak;  We again reviewed the recommended treatment protocol for her bronchiectasis (it was like she heard it for the 1st time)>  NEBS Tid, followed by [facility] & SPIRIVA daily, +Guaifenesin400-2Tid + Fluids, take the low dose Pred in AM (per Rheum) and the Singulair10 in PM, work to expectorate the phlegm but she declines to do chest PT, Flutter, postural drainage, etc... we plan ROV recheck in 15mo...  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    GI> prev eval apparently in [place] pt doesn't recall, no records avail, she has Prevacid30 & Zantac150Bid listed on med list but she says just prn... EXAM showed Afeb, VSS, O2sat=97% on RA;  Wt=162#;  Heent- neg, mallampati3;  Chest- decr BS bilat, not congested, w/o w/r/r heard;  Heart- RR gr1/6SEM w/o r/g detected;  Abd- soft, nontender, +panniculus;  Ext- VI w/o c/c/e...  IMP/PLAN>>  MsNatter has serious multisys dis & is managed by DrCrawford-PCP;  From the pulm standpoint she continues to downplay the severity of her lung dis & still refuses/ forgets/ etc to do her breathing treatments as we have outlined for her- REC to use NEB Tid, followed by Advair500Bid, Spiriva once dailyGFN400-2Tid w/ fluids, Singulair10, & Flutter valve/ postural drain/ purse-lip breathig treatments TID... Today we reviewed this regimen & added a Medrol Dosepak... She now appears to have signif GI-GERD/ reflux/ prob LPR & we added a vigorous antireflux regimen=> Prevacid30 taken 30 miin before dinner, NPO after dinner, elev HOB 6" blocks... She needs to see GI for further eval & needs these recommendations reinforced by them & her PCP...  ~  April 13, 2016:  40mo ROV & once again MsNatter returns by herself for a f/u visit> she tells me that she is doing satis, no new complaints or concerns;  When asked about her meds she says they are all the same but she does not know the names or the dosing regimen;  She states her breathing is the same- min SOB w/o change- eg w/ walking (says lim by knee); notes mild cough/ sput- clear w/o color or blood seen, no CP;  Today's agenda involves getting a handicap placard- we filled it out for her... 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 We re-iterate the max regimen at each office visit & print it out on the AVS for family to review & help pt with...    Cardiac> on Coreg6.25- 1/2Bid for ?HBP; she has mild AS & DiastCHF on 2DEcho and this needs follow up w/ her PCP (DrCrawford) vs Cards consult ...    Endocrine> IDDM on Lantus & Novolog (A1c=6.3), HL on Crestor10 (FLP- controlled), Hypothyroid on Synthroid50, transiently elev serum calcium ?etiology (resolved spont), osteoporosis w/ mult fxs on Prolia Q67mo; managed by DrGherghe but last seen 06/09/15- note reviewed; she sees DrCrawford frequently...    GYN> Hx  ovarian cancer (in remission- details unknown), s/p hyst & Ca125=9 (08/2014)... She is followed by DrFernandez now...    Ortho/ Rheum> DJD w/ TKR, FM on Lyrica, Tramadol, Percocet,etc, severe LBP requires epidural shots; she is on a huge dose of Lyrica; PMR on Pred (details unknown); her Sed=52 and she saw [facility] for Rheum 03/23/16- 26mo rov for  severe OA, FM, PMR, & osteoporosis on Lyrica 300Bid, Pred 2mg /d, Percocet, & Prolia (last dose 02/24/16); she felt worse when out of her Lyrica    Neuro & Psyche> on Aricept5 per DrCrawford plus Buspar & Cymbalta- I wonder if she is taking any/ all of her meds properly, no care  giver accompanies pt to office visits, she is ?stoic ?oblivious...    Heme> Hg 10-11 range May2016 after fall w/ fx nose & wrist; similar now & may need further anemia work-up as well, deferred to PCP... EXAM showed Afeb, VSS, O2sat=95% on RA;  Wt=160#;  Heent- neg, mallampati3;  Chest- decr BS bilat, not congested, w/o w/r/r heard;  Heart- RR gr1/6SEM w/o r/g detected;  Abd- soft, nontender, +panniculus;  Ext- VI w/o c/c/e...   Ambulatory Oximetry 04/13/16>  O2sat=92% on RA at rest w/ pulse= 80/min... She ambulated 1 Lap in office (185') w/ O2sat drop to 84% with pulse=89/min... IMP/PLAN>>  Zayonna has exercise induced hypoxemia & needs a POC w/ O2 at 2L/min w/ activity;  We will check an ONO in the interim to see if she needs nocturnal O2 as well;  In the meanwhile we once again reviewed w/ the pt regarding her MAX respiratory regimen-- Albut via NEB Tid, followed by Advair500Bid & Spiriva daily; continue GFN 400mg -2Tid w/ fluids; on Pred 2mg /d by rheum for PMR; on Singulair10 & flonase;  We also reviewed her antireflux regimen w/ Prevacid30 taken 30 min before dinner, NPO after dinner, elev HOB 6"... we plan another recheck in 43mo...  ~  July 12, 2016:  4mo ROV & Marlaine is here by herself again today- no complaints or concerns- states her breathing is good, she uses her portable O2 as needed (she doesn't like the O2 conc- makes too much noise, gives her migraines, etc), denies CP/ palpt/ SOB/ edema; notes min cough small amt whitish sput, no hemoptysis; denies f/c/s and says she is active & exercising on her own (does not go to gym etc- we discussed silver sneakers etc...    Her PCP is DrCrawford, requested referral to Urology for urinary incont, given handicap placard for parking, wants a new eye doctor due to droopy eyelids (rec [facility]), pt says that [facility] checked her anemia- "it's good, not to worry"... We reviewed the following medical problems during today's office visit >>     Pulmonary> Hx COPD/  Bronchiectasis/ Abn CT Chest w/ mucoid impactions, tracheobronchomalacia w/ air trapping, 55mm LLL nodule, Hx granulomatous lung dis & reported atyp mycobacterium in the past>  See Hi-res CTChest 12/2014;  We encouraged a regular bronchodilator regimen (NEBS w/ Albut vs Duoneb Tid, ADVAIR500Bid, SPIRIVA daily), Guaifenesin 400mg -2Tid, Singulair10, Fluids, Flutter/ postural drainage/ purse-lip breathing exercises, & check sput for cultures but she could not be bothered & refused further pulm treatment/ testing sput/ etc=> still not cooperating w/ max chest physiotherapy regimen & states she only gets a sm amt of whitish sput (unable to successfully induce sput in office... We re-iterate the max regimen at each office visit & print it out on the AVS for family to review & help pt with...    Cardiac> on Coreg6.25-  1/2Bid for ?HBP; she has mild AS & DiastCHF on 2DEcho and this needs follow up w/ her PCP (DrCrawford) vs Cards consult ...    Endocrine> IDDM on Lantus & Novolog (A1c=6.3), HL on Crestor10 (FLP- controlled), Hypothyroid on Synthroid50, transiently elev serum calcium ?etiology (resolved spont), osteoporosis w/ mult fxs on Prolia Q64mo; managed by DrGherghe but last seen 06/09/15- note reviewed; she sees DrCrawford frequently...    GYN> Hx ovarian cancer (in remission- details unknown), s/p hyst & Ca125=9 (08/2014)... She is followed by DrFernandez now...    Ortho/ Rheum> DJD w/ TKR, FM on Lyrica, Tramadol, Percocet,etc, severe LBP requires epidural shots; she is on a huge dose of Lyrica; PMR on Pred (details unknown); her Sed=52 and she saw [facility] for Rheum 03/23/16- 73mo rov for  severe OA, FM, PMR, & osteoporosis on Lyrica 300Bid, Pred 2mg /d, Percocet, & Prolia (last dose 02/24/16); she felt worse when out of her Lyrica    Neuro & Psyche> on Aricept5 per DrCrawford plus Buspar & Cymbalta- I wonder if she is taking any/ all of her meds properly, no care giver accompanies pt to office visits, she is ?stoic  ?oblivious...    Heme> Hg 10-11 range May2016 after fall w/ fx nose & wrist; similar now & may need further anemia work-up as well, deferred to PCP... EXAM showed Afeb, VSS, O2sat=97% on RA;  Wt=151#;  Heent- neg, mallampati3;  Chest- decr BS bilat, not congested, w/o w/r/r heard;  Heart- RR gr1/6SEM w/o r/g detected;  Abd- soft, nontender, +panniculus;  Ext- VI w/o c/c/e...   Ambulatory Oximetry 07/12/16>  O2sat=94% on RA at rest w/ pulse=97/min;  She ambulated 2 laps in office (185'ea) w/ lowest O2sat=88% w/ pulse=112/min;  This is improved from [id number];  Advised O2 at 2L/min w/ activities...  IMP/PLAN>>  Pt is asked to use O2 w/ ambulation & reminded again about the recommended pulm Rx- NEBS w/ Albut vs Duoneb Tid, ADVAIR500Bid, SPIRIVA daily, Guaifenesin 400mg -2Tid, Singulair10, Fluids, Flutter/ postural drainage/ purse-lip breathing exercises; call for any breathing issues, rov ~76mo...   ~  December 01, 2016:  61mo ROV & Brealynn is here by herself again today (duaghter brought her but is waiting in the car per pt)>  She was referred by DrCrawford for pulm eval due to cough & dyspnea, records from [place] indicated a hx of asthma/ bronchiectasis/ mult pulm nodules, she had left lung surg in 2000 for a benign granuloma & there was a question of an atypical mycobacterium; she is rather sedentary & poorly conditioned; she is a never smoker w/ 2nd hand smoke exposure;  CT Chest 10/16 showed> Norm heart size, atherosclerosis of Ao/ great vessels/ coronaries, no adenopathy, atrophic thyroid; ?small sliding pulm hernia betw the lat 7th & 8th ribs w/ scarring; extensive bronchiectasis bilat w/ mucoid impactions within dilated airways (infectious/inflamm bronchiolitis- r/o MAI), 82mm LLL nodule, tracheobronchomalacia w/ extensive bilat air trapping on expiration, [place], old healed right 6th-7th rib fx, DJD Tspine... She has demonstrated exercise hypoxemia & never did the requested ONO- she refuses to use her [place] O2  stating "I have allergies" and uses it prn only; she lives w/ daughter but she never comes back to the exam room w/ her mother during the OVs; pt says she rests well, not SOB, breathing well & denies cough/ sput/ hemoptysis, CP, f/c/s, etc... We have stressed a MAX bronchodil regimen w/ NEBS w/ Albut vs Duoneb Tid, ADVAIR500Bid, SPIRIVA daily, Guaifenesin 400mg -2Tid, Singulair10, Fluids, Flutter/ postural drainage/ purse-lip breathing exercises and an  antireflux regimen...     She has numerous medical specialists per Epic review>  Ophthalmology- DrShapiro, sent to oculofacial plastic surg DrWhite, Endocrine- DrGherghe, [place], PCP- DrCrawford, on Prolia q65mo... EXAM showed Afeb, VSS, O2sat=100% on RA;  Wt=144#;  Heent- neg, mallampati3;  Chest- decr BS bilat, not congested, w/o w/r/r heard;  Heart- RR gr1/6SEM w/o r/g detected;  Abd- soft, nontender, +panniculus;  Ext- VI w/o c/c/e...  IMP/PLAN>>  She is reminded about her mandatory pulm regimen & this is again written for her to share w/ family/ care givers but she remains unconcerned, indifferent, not motivated...     Past Medical History:  Diagnosis Date  . Arthritis   . Asthma   . Diabetes mellitus without complication ([place])   . Fibromyalgia   . GERD (gastroesophageal reflux disease)   . History of chemotherapy   . History of fractured pelvis   . Hypertension   . Osteoporosis   . Osteoporosis   . Ovarian cancer [facility])     Past Surgical History  Procedure Laterality Date  . Hip surgey    . Knee surgey    . Lung surgery >> resection of nodule 2000, proved to be a benign granuloma & ?atyp mycobacterium ident at the same time?    . Bladder suspension    . Ankle fracture surgery      plate and screws  . Abdominal hysterectomy  1996    Outpatient Encounter Prescriptions as of 12/01/2016  Medication Sig  . albuterol (PROVENTIL HFA;VENTOLIN HFA) 108 (90 Base) MCG/ACT inhaler Inhale 2 puffs into the lungs every 6 (six) hours as  needed for wheezing or shortness of breath.  Marland Kitchen albuterol (PROVENTIL) (2.5 MG/3ML) 0.083% nebulizer solution Take 3 mLs (2.5 mg total) by nebulization daily.  Marland Kitchen Apoaequorin (PREVAGEN) 10 MG CAPS Take 10 m by mouth daily.  . BD PEN NEEDLE NANO U/F 32G X 4 MM MISC USE 4 TIMES A DAY  . carvedilol (COREG) 6.25 MG tablet Take 3.125 mg by mouth 2 (two) times daily with a meal.   . denosumab (PROLIA) 60 MG/ML SOLN injection Inject 60 mg into the skin every 6 (six) months. Administer in upper arm, thigh, or abdomen  . Dexlansoprazole 30 MG capsule Take 1 capsule (30 mg total) by mouth daily.  Marland Kitchen Dextromethorphan-Guaifenesin (MUCINEX DM MAXIMUM STRENGTH) 60-1200 MG TB12 Take 1 tablet by mouth 2 (two) times daily as needed (for cough/phlegm). Reported on 06/23/2015  . donepezil (ARICEPT) 5 MG tablet TAKE 1 TABLET (5 MG TOTAL) BY MOUTH AT BEDTIME.  . DULoxetine (CYMBALTA) 60 MG capsule Take 60 mg by mouth daily.  . fluticasone (FLONASE) 50 MCG/ACT nasal spray Place 1-2 sprays into both nostrils daily as needed for allergies or rhinitis.  . Fluticasone-Salmeterol (ADVAIR DISKUS) 500-50 MCG/DOSE AEPB Inhale 1 puff into the lungs 2 (two) times daily.  Marland Kitchen glucose blood (FREESTYLE TEST STRIPS) test strip Use to test blood sugar 3 times daily. Dx: E11.65  . Insulin Glargine (LANTUS SOLOSTAR) 100 UNIT/ML Solostar Pen INJECT 20 UNITS SUBCUTANEOUSLY EVERY MORNING  . insulin lispro (HUMALOG KWIKPEN) 100 UNIT/ML KiwkPen Inject 4-6 units into the skin daily.  . Lancets (FREESTYLE) lancets Use to test blood sugar 3 times daily. Dx: E11.65  . LANTUS SOLOSTAR 100 UNIT/ML Solostar Pen INJECT 30 UNITS SUBCUTANEOUSLY EVERY MORNING  . levothyroxine (SYNTHROID, LEVOTHROID) 50 MCG tablet Take 1 tablet (50 mcg total) by mouth daily before breakfast.  . montelukast (SINGULAIR) 10 MG tablet TAKE 1 TABLET (10 MG TOTAL)  BY MOUTH DAILY.  . NON FORMULARY 1 Syringe by Epidural route every 3 (three) months. Epidural Steroid Injections for  Lumbar Spinal Stenosis up to 4 times a year  . oxyCODONE-acetaminophen (PERCOCET/ROXICET) 5-325 MG tablet Take 1 tablet by mouth 2 (two) times daily as needed for severe pain.  . predniSONE (DELTASONE) 1 MG tablet Take 2 mg by mouth daily with breakfast.   . pregabalin (LYRICA) 300 MG capsule Take 1 capsule (300 mg total) by mouth 2 (two) times daily.  . ranitidine (ZANTAC) 150 MG tablet Take 150 mg by mouth 2 (two) times daily as needed for heartburn.  . rosuvastatin (CRESTOR) 10 MG tablet Take 1 tablet (10 mg total) by mouth every evening.  . tiotropium (SPIRIVA HANDIHALER) 18 MCG inhalation capsule Place 1 capsule (18 mcg total) into inhaler and inhale daily.  Marland Kitchen zolpidem (AMBIEN) 5 MG tablet Take 1 tablet (5 mg total) by mouth at bedtime.   No facility-administered encounter medications on file as of 12/01/2016.     Allergies  Allergen Reactions  . Latex Rash  . Penicillins Rash     Immunization History  Administered Date(s) Administered  . Influenza, High Dose Seasonal PF 12/01/2016  . Influenza,inj,Quad PF,6+ Mos 01/17/2015, 12/18/2015  . Pneumococcal Polysaccharide-23 12/18/2015  . Tdap 08/02/2014    Current Medications, Allergies, Past Medical History, Past Surgical History, Family History, and Social History were reviewed in [employer] record.   Review of Systems             All symptoms NEG except where BOLDED >>  Constitutional:  F/C/S, fatigue, anorexia, unexpected weight change. HEENT:  HA, visual changes, hearing loss, earache, nasal symptoms, sore throat, mouth sores, hoarseness. Resp:  cough, sputum, hemoptysis; SOB, tightness, wheezing. Cardio:  CP, palpit, DOE, orthopnea, edema. GI:  N/V/D/C, blood in stool; reflux, abd pain, distention, gas. GU:  dysuria, freq, urgency, hematuria, flank pain, voiding difficulty. MS:  joint pain, swelling, tenderness, decr ROM; neck pain, back pain, etc. Neuro:  HA, tremors, seizures, dizziness, syncope,  weakness, numbness, gait abn. Skin:  suspicious lesions or skin rash. Heme:  adenopathy, bruising, bleeding. Psyche:  confusion, agitation, sleep disturbance, hallucinations, anxiety, depression suicidal.   Objective:   Physical Exam       Vital Signs:  Reviewed...   General:  WD, WN, 81 y/o WF in NAD; alert & oriented; pleasant & cooperative... HEENT:  [place]/AT; Conjunctiva- pink, Sclera- nonicteric, EOM-wnl, PERRLA, EACs-clear, TMs-wnl; NOSE-clear; THROAT-clear & wnl.  Neck:  Supple w/ fairROM; no JVD; normal carotid impulses w/o bruits; no thyromegaly or nodules palpated; no lymphadenopathy.  Chest:  Decr BS bilat, few scat rhonchi otherw clear not congested no wheezing rales or signs of consolidation... Heart:  Regular Rhythm; norm S1 & S2 w/ Gr1/6 SEM w/o rubs or gallops detected... Abdomen:  Soft & nontender, +panniculus- no guarding or rebound; normal bowel sounds; no organomegaly or masses palpated. Ext:  decrROM; without deformities +arthritic changes; +venous insuffic, no edema;  Pulses intact w/o bruits. Neuro:  No focal neuro deficit; abn gait & balance poor, uses cane... Derm:  No lesions noted; no rash etc. Lymph:  No cervical, supraclavicular, axillary, or inguinal adenopathy palpated.   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    Cardiac> on Coreg6.25- 1/2Bid for ?HBP; she has mild AS & DiastCHF on 2DEcho and this needs follow up w/ her PCP vs Cards...    Endocrine> IDDM on Lantus & Novolog (A1c=6.3), HL on Crestor10 (FLP- controlled), Hypothyroid on Synthroid50, transiently elev serum calcium ?etiology (resolved spont), osteoporosis w/ mult fxs on Prolia Q15mo; managed by DrGherghe & last seen 06/09/15- note reviewed...    GI> She presented w/ symptoms of reflux/ LPR w/ cough while eating etc => needs GI eval & we will refer (see recs below)...    GYN> Hx ovarian cancer (in remission- details unknown), s/p hyst & Ca125=9 (08/2014)... She is followed by DrFernandez now...    Ortho/ Rheum> DJD w/ TKR, FM on Lyrica, Tramadol, Percocet,etc, severe LBP requires epidural shots; she is on a huge dose of Lyrica; PMR on Pred (details unknown); her Sed=52 and she saw [facility] for Rheum last 09/03/15- severe OA, FM, PMR, & osteoporosis on Lyrica, Pred, Percocet, & Prolia; she felt worse when out of her Lyrica    Neuro> on Aricept5 per DrCrawford, I wonder if she is taking any/ all of her meds properly, no care giver accompanies pt to office visits, she is ?stoic ?oblivious...    Heme> Hg 10-11 range May2016 after fall w/ fx nose & wrist; similar now & may need further anemia work-up as well, deferred to PCP...  PLAN >>   01/19/15>   We discussed Rx w/ ZPak for her URI (it could also help her if she has atyp-mycobacterium); we compromised rx w/ NEBS TID- followed by AdvairBid & Spiriva once daily, plus the MUCINEX1200mg Bid w/ fluids;  She again declines the added benefit from a vigorous chest PT regimen, flutter valve, postural drainage, purse lip breathing, etc... 06/23/15>   We again outlined for MrsNatter a vigorous home treatment regimen for her bronchiectasis & mucoid impactions including a TID regimen>  NEB w/ Albut vs Duoneb Tid, followed by her Advair500Bid & Spiriva once/d, Guaifenesin  400mg  tabs- 2Tid w/ fluids, take her low dose Pred in AM & Singulair in PM; she declines to do chest PT/ Flutter & postural drainage treatments... 10/01/15>   Pleshette has a URI & mild COPD exac=> we will treat w/ Doxy100Bid (her request) and Medrol dosepak;  We again reviewed the recommended treatment protocol for her bronchiectasis (it was like she heard it for the 1st time)>  NEBS Tid, followed by [facility] & SPIRIVA daily, +Guaifenesin400-2Tid + Fluids, take the low dose Pred in AM (per Rheum) and the Singulair10 in PM, work to expectorate the phlegm but she declines to do chest PT, Flutter, postural drainage, etc... we plan ROV recheck in 89mo... 12/25/15>   MsNatter has serious multisys dis & is managed by DrCrawford-PCP;  From the pulm standpoint she continues to downplay the severity of her lung dis & still refuses/ forgets/ etc to do her breathing treatments as we have outlined for her- REC to use NEB Tid, followed by Advair500Bid, Spiriva once dailyGFN400-2Tid w/ fluids, Singulair10, & Flutter valve/ postural drain/ purse-lip breathig treatments TID... Today we reviewed this regimen &  added a Medrol Dosepak... She now appears to have signif GI-GERD/ reflux/ prob LPR & we added a vigorous antireflux regimen=> Prevacid30 taken 30 miin before dinner, NPO after dinner, elev HOB 6" blocks... She needs to see GI for further eval & needs these recommendations reinforced by them & her PCP. 04/13/16>   Shataria has exercise induced hypoxemia & needs a POC w/ O2 at 2L/min w/ activity;  We will check an ONO in the interim to see if she needs nocturnal O2 as well;  In the meanwhile we once again reviewe4d w/ the pt regarding her MAX respiratory regimen-- Albut via NEB Tid, followed by Advair500Bid & Spiriva daily; continue GFN 400mg -2Tid w/ fluids; on Pred 2mg /d by rheum for PMR; on Singulair10 & flonase;  We also reviewed her antireflux regimen w/ Prevacid30 taken 30 min before dinner, NPO after dinner, elev HOB  6" 07/12/16>   Pt is asked to use O2 w/ ambulation & reminded again about the recommended pulm Rx- NEBS w/ Albut vs Duoneb Tid, ADVAIR500Bid, SPIRIVA daily, Guaifenesin 400mg -2Tid, Singulair10, Fluids, Flutter/ postural drainage/ purse-lip breathing exercises; call for any breathing issues, rov ~64mo 12/01/16>   She is reminded about her mandatory pulm regimen & this is again written for her to share w/ family/ care givers but she remains unconcerned, indifferent, not motivated   Plan:     Patient's Medications  New Prescriptions   No medications on file  Previous Medications   ALBUTEROL (PROVENTIL HFA;VENTOLIN HFA) 108 (90 BASE) MCG/ACT INHALER    Inhale 2 puffs into the lungs every 6 (six) hours as needed for wheezing or shortness of breath.   ALBUTEROL (PROVENTIL) (2.5 MG/3ML) 0.083% NEBULIZER SOLUTION    Take 3 mLs (2.5 mg total) by nebulization daily.   APOAEQUORIN (PREVAGEN) 10 MG CAPS    Take 10 m by mouth daily.   BD PEN NEEDLE NANO U/F 32G X 4 MM MISC    USE 4 TIMES A DAY   CARVEDILOL (COREG) 6.25 MG TABLET    Take 3.125 mg by mouth 2 (two) times daily with a meal.    DENOSUMAB (PROLIA) 60 MG/ML SOLN INJECTION    Inject 60 mg into the skin every 6 (six) months. Administer in upper arm, thigh, or abdomen   DEXLANSOPRAZOLE 30 MG CAPSULE    Take 1 capsule (30 mg total) by mouth daily.   DEXTROMETHORPHAN-GUAIFENESIN (MUCINEX DM MAXIMUM STRENGTH) 60-1200 MG TB12    Take 1 tablet by mouth 2 (two) times daily as needed (for cough/phlegm). Reported on 06/23/2015   DONEPEZIL (ARICEPT) 5 MG TABLET    TAKE 1 TABLET (5 MG TOTAL) BY MOUTH AT BEDTIME.   DULOXETINE (CYMBALTA) 60 MG CAPSULE    Take 60 mg by mouth daily.   FLUTICASONE (FLONASE) 50 MCG/ACT NASAL SPRAY    Place 1-2 sprays into both nostrils daily as needed for allergies or rhinitis.   FLUTICASONE-SALMETEROL (ADVAIR DISKUS) 500-50 MCG/DOSE AEPB    Inhale 1 puff into the lungs 2 (two) times daily.   GLUCOSE BLOOD (FREESTYLE TEST STRIPS) TEST  STRIP    Use to test blood sugar 3 times daily. Dx: E11.65   INSULIN GLARGINE (LANTUS SOLOSTAR) 100 UNIT/ML SOLOSTAR PEN    INJECT 20 UNITS SUBCUTANEOUSLY EVERY MORNING   INSULIN LISPRO (HUMALOG KWIKPEN) 100 UNIT/ML KIWKPEN    Inject 4-6 units into the skin daily.   LANCETS (FREESTYLE) LANCETS    Use to test blood sugar 3 times daily. Dx: E11.65   LANTUS SOLOSTAR 100  UNIT/ML SOLOSTAR PEN    INJECT 30 UNITS SUBCUTANEOUSLY EVERY MORNING   LEVOTHYROXINE (SYNTHROID, LEVOTHROID) 50 MCG TABLET    Take 1 tablet (50 mcg total) by mouth daily before breakfast.   MONTELUKAST (SINGULAIR) 10 MG TABLET    TAKE 1 TABLET (10 MG TOTAL) BY MOUTH DAILY.   NON FORMULARY    1 Syringe by Epidural route every 3 (three) months. Epidural Steroid Injections for Lumbar Spinal Stenosis up to 4 times a year   OXYCODONE-ACETAMINOPHEN (PERCOCET/ROXICET) 5-325 MG TABLET    Take 1 tablet by mouth 2 (two) times daily as needed for severe pain.   PREDNISONE (DELTASONE) 1 MG TABLET    Take 2 mg by mouth daily with breakfast.    PREGABALIN (LYRICA) 300 MG CAPSULE    Take 1 capsule (300 mg total) by mouth 2 (two) times daily.   RANITIDINE (ZANTAC) 150 MG TABLET    Take 150 mg by mouth 2 (two) times daily as needed for heartburn.   ROSUVASTATIN (CRESTOR) 10 MG TABLET    Take 1 tablet (10 mg total) by mouth every evening.   TIOTROPIUM (SPIRIVA HANDIHALER) 18 MCG INHALATION CAPSULE    Place 1 capsule (18 mcg total) into inhaler and inhale daily.   ZOLPIDEM (AMBIEN) 5 MG TABLET    Take 1 tablet (5 mg total) by mouth at bedtime.  Modified Medications   No medications on file  Discontinued Medications   No medications on file

## 2016-12-02 DIAGNOSIS — M79661 Pain in right lower leg: Secondary | ICD-10-CM | POA: Diagnosis not present

## 2016-12-02 DIAGNOSIS — R350 Frequency of micturition: Secondary | ICD-10-CM | POA: Diagnosis not present

## 2016-12-02 DIAGNOSIS — M1712 Unilateral primary osteoarthritis, left knee: Secondary | ICD-10-CM | POA: Diagnosis not present

## 2016-12-02 DIAGNOSIS — N3941 Urge incontinence: Secondary | ICD-10-CM | POA: Diagnosis not present

## 2016-12-02 DIAGNOSIS — Z9181 History of falling: Secondary | ICD-10-CM | POA: Diagnosis not present

## 2016-12-09 DIAGNOSIS — R350 Frequency of micturition: Secondary | ICD-10-CM | POA: Diagnosis not present

## 2016-12-09 DIAGNOSIS — N3941 Urge incontinence: Secondary | ICD-10-CM | POA: Diagnosis not present

## 2016-12-10 DIAGNOSIS — M1712 Unilateral primary osteoarthritis, left knee: Secondary | ICD-10-CM | POA: Diagnosis not present

## 2016-12-10 DIAGNOSIS — M79661 Pain in right lower leg: Secondary | ICD-10-CM | POA: Diagnosis not present

## 2016-12-10 DIAGNOSIS — Z9181 History of falling: Secondary | ICD-10-CM | POA: Diagnosis not present

## 2016-12-13 DIAGNOSIS — F331 Major depressive disorder, recurrent, moderate: Secondary | ICD-10-CM | POA: Diagnosis not present

## 2016-12-13 DIAGNOSIS — F419 Anxiety disorder, unspecified: Secondary | ICD-10-CM | POA: Diagnosis not present

## 2016-12-16 DIAGNOSIS — R350 Frequency of micturition: Secondary | ICD-10-CM | POA: Diagnosis not present

## 2016-12-16 DIAGNOSIS — N3941 Urge incontinence: Secondary | ICD-10-CM | POA: Diagnosis not present

## 2016-12-22 DIAGNOSIS — M79661 Pain in right lower leg: Secondary | ICD-10-CM | POA: Diagnosis not present

## 2016-12-22 DIAGNOSIS — Z9181 History of falling: Secondary | ICD-10-CM | POA: Diagnosis not present

## 2016-12-22 DIAGNOSIS — M1712 Unilateral primary osteoarthritis, left knee: Secondary | ICD-10-CM | POA: Diagnosis not present

## 2016-12-23 DIAGNOSIS — R350 Frequency of micturition: Secondary | ICD-10-CM | POA: Diagnosis not present

## 2016-12-23 DIAGNOSIS — N3941 Urge incontinence: Secondary | ICD-10-CM | POA: Diagnosis not present

## 2016-12-30 DIAGNOSIS — R350 Frequency of micturition: Secondary | ICD-10-CM | POA: Diagnosis not present

## 2016-12-30 DIAGNOSIS — N3941 Urge incontinence: Secondary | ICD-10-CM | POA: Diagnosis not present

## 2017-01-05 DIAGNOSIS — M25512 Pain in left shoulder: Secondary | ICD-10-CM | POA: Diagnosis not present

## 2017-01-05 DIAGNOSIS — M19012 Primary osteoarthritis, left shoulder: Secondary | ICD-10-CM | POA: Diagnosis not present

## 2017-01-06 DIAGNOSIS — R350 Frequency of micturition: Secondary | ICD-10-CM | POA: Diagnosis not present

## 2017-01-06 DIAGNOSIS — N3941 Urge incontinence: Secondary | ICD-10-CM | POA: Diagnosis not present

## 2017-01-11 DIAGNOSIS — F419 Anxiety disorder, unspecified: Secondary | ICD-10-CM | POA: Diagnosis not present

## 2017-01-11 DIAGNOSIS — F331 Major depressive disorder, recurrent, moderate: Secondary | ICD-10-CM | POA: Diagnosis not present

## 2017-01-13 DIAGNOSIS — R350 Frequency of micturition: Secondary | ICD-10-CM | POA: Diagnosis not present

## 2017-01-13 DIAGNOSIS — N3941 Urge incontinence: Secondary | ICD-10-CM | POA: Diagnosis not present

## 2017-01-21 DIAGNOSIS — M25512 Pain in left shoulder: Secondary | ICD-10-CM | POA: Diagnosis not present

## 2017-01-21 DIAGNOSIS — M13812 Other specified arthritis, left shoulder: Secondary | ICD-10-CM | POA: Diagnosis not present

## 2017-01-28 DIAGNOSIS — M13812 Other specified arthritis, left shoulder: Secondary | ICD-10-CM | POA: Diagnosis not present

## 2017-01-28 DIAGNOSIS — M25512 Pain in left shoulder: Secondary | ICD-10-CM | POA: Diagnosis not present

## 2017-01-31 DIAGNOSIS — M25512 Pain in left shoulder: Secondary | ICD-10-CM | POA: Diagnosis not present

## 2017-01-31 DIAGNOSIS — M13812 Other specified arthritis, left shoulder: Secondary | ICD-10-CM | POA: Diagnosis not present

## 2017-02-07 DIAGNOSIS — M13812 Other specified arthritis, left shoulder: Secondary | ICD-10-CM | POA: Diagnosis not present

## 2017-02-07 DIAGNOSIS — M25512 Pain in left shoulder: Secondary | ICD-10-CM | POA: Diagnosis not present

## 2017-02-22 DIAGNOSIS — N3941 Urge incontinence: Secondary | ICD-10-CM | POA: Diagnosis not present

## 2017-02-24 ENCOUNTER — Ambulatory Visit (INDEPENDENT_AMBULATORY_CARE_PROVIDER_SITE_OTHER): Payer: Medicare Other | Admitting: Pulmonary Disease

## 2017-02-24 ENCOUNTER — Encounter: Payer: Self-pay | Admitting: Pulmonary Disease

## 2017-02-24 VITALS — BP 118/72 | HR 67 | Temp 98.0°F | Ht 60.0 in | Wt 137.6 lb

## 2017-02-24 DIAGNOSIS — M150 Primary generalized (osteo)arthritis: Secondary | ICD-10-CM | POA: Diagnosis not present

## 2017-02-24 DIAGNOSIS — E039 Hypothyroidism, unspecified: Secondary | ICD-10-CM

## 2017-02-24 DIAGNOSIS — I5032 Chronic diastolic (congestive) heart failure: Secondary | ICD-10-CM

## 2017-02-24 DIAGNOSIS — Z794 Long term (current) use of insulin: Secondary | ICD-10-CM | POA: Diagnosis not present

## 2017-02-24 DIAGNOSIS — M159 Polyosteoarthritis, unspecified: Secondary | ICD-10-CM

## 2017-02-24 DIAGNOSIS — M8949 Other hypertrophic osteoarthropathy, multiple sites: Secondary | ICD-10-CM

## 2017-02-24 DIAGNOSIS — M353 Polymyalgia rheumatica: Secondary | ICD-10-CM | POA: Diagnosis not present

## 2017-02-24 DIAGNOSIS — R0902 Hypoxemia: Secondary | ICD-10-CM | POA: Diagnosis not present

## 2017-02-24 DIAGNOSIS — M818 Other osteoporosis without current pathological fracture: Secondary | ICD-10-CM

## 2017-02-24 DIAGNOSIS — E1165 Type 2 diabetes mellitus with hyperglycemia: Secondary | ICD-10-CM | POA: Diagnosis not present

## 2017-02-24 DIAGNOSIS — J479 Bronchiectasis, uncomplicated: Secondary | ICD-10-CM

## 2017-02-24 DIAGNOSIS — K219 Gastro-esophageal reflux disease without esophagitis: Secondary | ICD-10-CM

## 2017-02-24 DIAGNOSIS — I10 Essential (primary) hypertension: Secondary | ICD-10-CM | POA: Diagnosis not present

## 2017-02-24 NOTE — Patient Instructions (Signed)
Today we updated your med list in our EPIC system...    Keep up the good work & continue your current medications the same...  Today we did an ambulatory oximetry test in the office...    And we will arrange for a home care company to do an [place] test at home...  Once we have both results we will contact your OXYGEN supplier to determine if you qualify to continue your oxygen treatment or not (based on Medicare's requirements for continued oxygen)...  Call for any questions...  Let's plan a follow up visit in 50mo, sooner if needed for problems.Marland KitchenMarland Kitchen

## 2017-02-24 NOTE — Progress Notes (Signed)
Subjective:     Patient ID: Kayla Thompson, female   DOB: [birth year], 81 y.o.   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  ~  January 07, 2015:  Initial pulmonary consult w/ SN>      77 y/o WF, referred by [name]) for a pulmonary evaluation due to cough & dyspnea>  Kayla Thompson moved to [place] about 22mo ago to be near her daughter, granddaughter, and great-grands after her husb passed away;  Shortly after arrival she fell & was [facility] w/ fx nasal bones and left wrist (distal radius & ulna) managed by Burnett Sheng summary reviewed... She established Primary Care w/ DrKollar, [place], Endocrine- DrGherghe... She has a Hx of pulmonary problems identified by the Pt & records from [facility] as Bronchiectasis and Asthma;  Her CC is SOB/ DOE w/ min activity & it is apparent that she is rather sedentary and a poor historian; she notes mild cough, some yellow sputum, no hemoptysis; she denies CP, palpit, or signif cardiac history...  Smoking Hx>  She is a never smoker, but had signif 2nd hand exposure from her husb...  Pulmonary Hx>  She indicates hx left lung surg 2000 for benign granuloma; hx bronchiectasis but she does not know how the Dx was established; told asthma as well ever since early 2000's but not as a child or young adult; states she had pneumonia age 66 at same time as the measles (not hosp & made full recovery w/o apparent residual resp problems); she does admit to recurrent bronchitic infections as adult requiring antibiotics & occas Pred rx... She adds a rambling hx of work-ups at [facility] (in [place] in [place] but she does not know what this was for?       Notes from Dr. Mick Sell in [place]> Bronchiectasis, mult pulm nodules, & mild Asthma; prev pulm nodule resected in 2000 which proved to be a granuloma; he reported that an atypical  mycobacterium was found at the time of lung bx in the past- no further details provided; seen for cough w/ yellow sput & incr dyspnea- reported to be stable on Advair500Bid, Spiriva daily, Singulair10, NEB w/ Albut-Bid, Mucinex, VentolinHFA rescue prn; she was also on Pred 3mg /d for PMR from her Rheum;   Medical Hx>  Diastolic Dysfunction on 2DEcho, mild AS, HL, IDDM, Hypothyroid, GERD, DJD w/ TKR and ankle surg; Fibromyalgia on Lyrica/ Percocet/ Tramadol; PMR per Rheum in [place] on Pred2mg /d x 61yr (it was last dropped from 3mg  ~27yr ago); Osteoporosis; Anemia; Anxiety.   Family Hx>  Mother died from lung cancer (as did one of her sisters);  Father passed from heart disease.  Occup Hx>  No known occupational exposures  Current Meds>  SEE FULL MED LIST BELOW EXAM showed Afeb, VSS, O2sat=99% on RA;  Heent- neg, mallampati3;  Chest- decr BS bilat, not congested, w/o w/r/r heard;  Heart- RR gr1/6SEM w/o r/g detected;  Abd- soft, nontender, +panniculus;  Ext- VI w/o c/c/e...   CT Chest 11/17/09 in [place]> mult noncalcif pulm nodules bilat (stable when compared to older scans), mucoid impaction LUL & RML, pleuroparenchymal scarring right base (unchanged), no adenopathy, norm heart size & coronary calcif.  PFTs 02/04/10 in [place]> FVC=2.20 (95%), FEV1=1.42 (88%), %1sec=65, mid-flows reduced at 38% predicted; no improvement in FEV1 after bronchodil; TLC=4.70 (118%), RV=2.48 (153%), RV/TLC=53%; DLCO=54% predicted.  CXR 01/14/14 reported to show norm heart  size, clear lungs, thoracic spondylosis, NAD...   CXR 08/02/14 in ER here after fall showed norm heart size, diffuse interstitial coarsening w/o focal airsp dis...   2DEcho 08/02/14 in [place] after fall showed norm LVF w/ EF=55-60%, no regional wall motion abn, Gr1 DD, mild AS w/ mean gradient 48mmHg; mod calcif MV leaflets w/o stenosis or regurg; RV looked OK but could not est PAsys...  CXR here 01/07/15 showed norm heart size, incr AP diameter of chest & flat  diaphragms, incr markings diffusely, old right rib fxs, osteopenia...   Spirometry 01/07/15> we attempted mult trial of simple spirometry but pt could not generate an acceptable curve & results are not reliable...  Ambulatory oxygen saturation test 01/07/15> O2sat=98% on RA at rest w/ pulse=79/min; she ambulated 2 laps in office & stopped w/ dyspnea, lowest O2sat=87% w/ pulse=95/min  LABS 01/07/15>  Chems- wnl x BS=45;  CBC- Hg=11.0, MCV=87, WBC=16.3, diff ok;  Sed=52 IMP/PLAN>>  Complex 81 y/o woman:    Pulmonary> Hx COPD/ Bronchiectasis, Hx granulomatous lung dis & reported atyp mycobacterium in the past, ?other ILD- awaiting Hi-res CT Chest results; she has clinical hx asthma in the past but no reversibility on prev PFTs; REC to continue Advair500, Spiriva, NEBS, Singulair, Mucinex, Ventolin HFA for now; may need further ILD work-up w/ collagen-vasc screen QuantGold, sputum studies, etc... We will follow closely.    Cardiac> on Coreg6.25Bid for ?HBP; she has mild AS & DiastCHF on 2DEcho and this needs follow up...    Endocrine> IDDM, Hypothyroid, transiently elev serum calcium ?etiology (resolved spont); managed by DrGherghe; BS today is 45 & asked to eat & check w/ endocrine re- adjustment in Novolog...    GYN> Hx ovarian cancer (in remission- details unknown), s/p hyst    Ortho/ Rheum> DJD w/ TKR, FM on Lyrica etc, severe LBP requires epidural shots; PMR on Pred (details unknown); her Sed=52 and she will prob need Rheum to help sort this out...    Heme> Hg 10-11 range May2016 after fall w/ fx nose & wrist; similar now & may need further anemia work-up as well...  ADDENDUM>>  Hi-res CT Chest 01/14/15:  Norm heart size, atherosclerosis of Ao/ great vessels/ coronaries, no adenopathy, atrophic thyroid; ?small sliding pulm hernia betw the lat 7th & 8th ribs w/ scarring; extensive bronchiectasis bilat w/ mucoid impactions within dilated airways (infectious/inflamm bronchiolitis- r/o MAI), 1mm LLL  nodule, tracheobronchomalacia w/ extensive bilat air trapping on expiration, smallHH, old healed right 6th-7th rib fx, DJD Tspine...  REC> continue NEBS, Advair, Spiriva, Mucinex (maximize to 600mg  Qid w/ fluids), practice purse lip breathing to splint the airways open & aide in expectoration of phlegm, use FLUTTER valve & postural drainage to get the sput out; collect sput for routine C&S, AFB, Fungi => TC w/ pt: she has a lot going on, refuses further meds, refuses further eval, can't be bothered w/ learning Chest PT/ Flutter, postural drainage, purse lip breathing, collecting sputum specimen, etc; states she's had all this a long time & not interested in more aggressive pulmonary treatments at this time... She will f/u w/ me in November & we will discuss again.  ADDENDUM>>  Records from Dr. Mick Sell, Pulmonary in [place]=> note of 07/18/13:  Hx mild asthma & bronchiectasis, hx mult pulm nodules followed for several yrs & one removed surgically in 2000 that proved to be a granuloma & report of an atypical mycobacterium being found at the same time (but no details given), on NEB w/ Albuterol,  Advair, Spiriva, Singulair, Mucinex, low-dose Pred for PMR;  She was given all indicated vaccinations;  No change in her med regimen for yrs...   ~  February 19, 2015:  6wk ROV w/ SN>  SEE ABOVE-- pt notes nasal congestion, chest congestion, sl cough & incr SOB for the last wk or so; in the next breath she says "I seldom get a cold", thinks it's all allergies, notes great-grnads recently ill;  She has NEBS w/ Albut (only using once per day), Advair500Bid, Spiriva once daily, Mucinex (?how much), Pred?1mg - 3/d, Singulair10/ Zyrtek10...  Since she was here last she has followed up w/ PCP-DrCrawford 10/25> FM doing ok on Lyrica & she reported more functional & resting better...   She also had a f/u w/ Endocrine-DrGherghe 11/8> DM, osteoporosis, hypothyroid, & Hx hyperpara; Labs reviewed & ok (A1c=6.5 on her insulin,  Ca is wnl and vitD=66)...    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     Cardiac> on Coreg6.25Bid for ?HBP; she has mild AS & DiastCHF on 2DEcho and this needs follow up w/ her PCP vs Cards...    Endocrine> IDDM, Hypothyroid, transiently elev serum calcium ?etiology (resolved spont); managed by DrGherghe...    GYN> Hx ovarian cancer (in remission- details unknown), s/p hyst & Ca125=9 (08/2014)...    Ortho/ Rheum> DJD w/ TKR, FM on Lyrica, Tramadol, Percocet,etc, severe LBP requires epidural shots; PMR on Pred (details unknown); her Sed=52 and she will prob need Rheum to help sort this out...    Heme> Hg 10-11 range May2016 after fall w/ fx nose & wrist; similar now & may need further anemia work-up as well... EXAM showed Afeb, VSS, O2sat=93% on RA;  Wt=162#;  Heent- neg, mallampati3;  Chest- decr BS bilat, not congested, w/o w/r/r heard;  Heart- RR gr1/6SEM w/o r/g detected;  Abd- soft, nontender, +panniculus;  Ext- VI w/o c/c/e...   Pt given a neb treatment in office- unable to produce any sput for cultures... IMP/PLAN>>  We discussed Rx w/ ZPak for her URI (it could also help her if she has atyp-mycobacterium); we compromised rx w/ NEBS TID- followed by AdvairBid & Spiriva once daily, plus the MUCINEX1200mg Bid w/ fluids;  She again declines the added benefit from a vigorous chest PT regimen, flutter valve, postural drainage, purse lip breathing, etc... we plan a recheck in 3 months...  ~  June 23, 2015:  75mo ROV  w/ SN>  Kayla Thompson returns unaccompanied and states that her breathing is good- no problem she says; notes min cough, sm amt whitish sput (no color, no blood), admits to mild SOB/DOE but swears there is no progression, and denies CP/ f/c/s/ etc... She states that she was INTOL to ZPak given last OV but can't recall the reaction ("?didn't work for me"), doesn't want it again;  We reviewed her meds & again she is not being aggressive enough to effectively treat her bronchiectasis & mucoid impactions> eg. Only using the Albut NEBS prn & ave once Qod, only using the Mucinex 1200 once daily, she says she is using the ADVAIR500Bid, SPIRIVA once daily, SINGULAIR10, PRED1mg -2tabs  Qam ?per PCP/ ?Rheum... 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    Cardiac> on Coreg6.25- 1/2Bid for ?HBP; she has mild AS & DiastCHF on 2DEcho and this needs follow up w/ her PCP vs Cards...    Endocrine> IDDM on Lantus & Novolog (A1c=6.3), HL on Crestor10 (FLP- controlled), Hypothyroid on Synthroid50, transiently elev serum calcium ?etiology (resolved spont), osteoporosis w/ mult fxs on Prolia Q55mo; managed by DrGherghe & last seen 06/09/15- note reviewed...    GYN> Hx ovarian cancer (in remission- details unknown), s/p hyst & Ca125=9 (08/2014)...    Ortho/ Rheum> DJD w/ TKR, FM on Lyrica,  Tramadol, Percocet,etc, severe LBP requires epidural shots; she is on a huge dose of Lyrica; PMR on Pred (details unknown); her Sed=52 and she will prob need Rheum to help sort this out...    Neuro> on Aricept5 per DrCrawford, I wonder if she is taking any/ all of her meds properly, no care giver accompanies pt to office visits, she is ?stoic ?oblivious...    Heme> Hg 10-11 range May2016 after fall w/ fx nose & wrist; similar now & may need further anemia work-up as well, deferred to PCP... EXAM showed Afeb, VSS, O2sat=98% on RA;  Wt=161#;  Heent- neg, mallampati3;  Chest- decr BS bilat, not congested, w/o w/r/r heard;  Heart- RR gr1/6SEM w/o r/g detected;  Abd- soft, nontender, +panniculus;  Ext- VI w/o c/c/e...   LABS 2016:  Reviewed... IMP/PLAN>>  We again outlined for Kayla Thompson a vigorous home treatment regimen for her bronchiectasis & mucoid impactions including a TID regimen>  NEB w/ Albut vs Duoneb Tid, followed by her Advair500Bid & Spiriva once/d, Guaifenesin 400mg  tabs- 2Tid w/ fluids, take her low dose Pred in AM & Singulair in PM; she declines to do chest PT/ Flutter & postural drainage treatments...  ~  October 01, 2015:  42mo ROV & add-on appt requested by pt for COPD exac>  She reports that she went to a granddaughter's wedding in [place] 6/24, then spent 1wk in [place] and "caught a cold", didn't go to UC or see a physician, just "toughed it out", getting better slowly but symptoms lingering she says> head is woozy, nasal drainage, sl cough, white sput (no discoloration or blood), sl tight/ wheezy/ congested but no CP/ f/c/s, etc... She reminds me that "ZPak doesn't work for me", and she wants Doxy;  It is apparent that she is still NOT doing her Resp treatment regimen as I have prev requested & outlined for her (see below)- acts as if this is the first time she has heard this from me, she remains on Aricept5 from PCP & has not seen Neuro...we reviewed the following medical problems during today's office  visit >>     Pulmonary> Hx COPD/ Bronchiectasis/ Abn CT Chest w/ mucoid impactions, tracheobronchomalacia w/ air trapping, 10mm LLL nodule, Hx granulomatous lung dis & reported atyp mycobacterium in  the past>  See Hi-res CTChest 12/2014;  We encouraged a regular bronchodilator regimen (NEBS w/ Albut vs Duoneb Tid, ADVAIR500Bid, SPIRIVA daily), Guaifenesin 400mg -2Tid, Singulair10, Fluids, Flutter/ postural drainage/ purse-lip breathing exercises, & check sput for cultures but she could not be bothered & refused further pulm treatment/ testing sput/ etc=> still not cooperating w/ max chest physiotherapy regimen & states she only gets a sm amt of whitish sput (unable to successfully induce sput in office...    Cardiac> on Coreg6.25- 1/2Bid for ?HBP; she has mild AS & DiastCHF on 2DEcho and this needs follow up w/ her PCP vs Cards...    Endocrine> IDDM on Lantus & Novolog (A1c=6.3), HL on Crestor10 (FLP- controlled), Hypothyroid on Synthroid50, transiently elev serum calcium ?etiology (resolved spont), osteoporosis w/ mult fxs on Prolia Q64mo; managed by DrGherghe & last seen 06/09/15- note reviewed...    GYN> Hx ovarian cancer (in remission- details unknown), s/p hyst & Ca125=9 (08/2014)... She is followed by DrFernandez now...    Ortho/ Rheum> DJD w/ TKR, FM on Lyrica, Tramadol, Percocet,etc, severe LBP requires epidural shots; she is on a huge dose of Lyrica; PMR on Pred (details unknown); her Sed=52 and she saw [facility] for Rheum [facility] 09/03/15- severe OA, FM, PMR, & osteoporosis on Lyrica, Pred, Percocet, & Prolia; she felt worse when out of her Lyrica    Neuro> on Aricept5 per DrCrawford, I wonder if she is taking any/ all of her meds properly, no care giver accompanies pt to office visits, she is ?stoic ?oblivious...    Heme> Hg 10-11 range May2016 after fall w/ fx nose & wrist; similar now & may need further anemia work-up as well, deferred to PCP... EXAM showed Afeb, VSS, O2sat=96% on RA;  Wt=167#;  Heent-  neg, mallampati3;  Chest- decr BS bilat, not congested, w/o w/r/r heard;  Heart- RR gr1/6SEM w/o r/g detected;  Abd- soft, nontender, +panniculus;  Ext- VI w/o c/c/e...   CXR 11/04/15 showedCOPD, scarring in right mid lung, chr changes and NAD... IMP/PLAN>>  Berneice has a URI & mild COPD exac=> we will treat w/ Doxy100Bid (her request) and Medrol dosepak;  We again reviewed the recommended treatment protocol for her bronchiectasis (it was like she heard it for the 1st time)>  NEBS Tid, followed by [facility] & SPIRIVA daily, +Guaifenesin400-2Tid + Fluids, take the low dose Pred in AM (per Rheum) and the Singulair10 in PM, work to expectorate the phlegm but she declines to do chest PT, Flutter, postural drainage, etc... we plan ROV recheck in 15mo...  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    GI> prev eval apparently in [place] pt doesn't recall, no records avail, she has Prevacid30 & Zantac150Bid listed on med list but she says just prn... EXAM showed Afeb, VSS, O2sat=97% on RA;  Wt=162#;  Heent- neg, mallampati3;  Chest- decr BS bilat, not congested, w/o w/r/r heard;  Heart- RR gr1/6SEM w/o r/g detected;  Abd- soft, nontender, +panniculus;  Ext- VI w/o c/c/e...  IMP/PLAN>>  Kayla Thompson has serious multisys dis & is managed by DrCrawford-PCP;  From the pulm standpoint she continues to downplay the severity of her lung dis & still refuses/ forgets/ etc to do her breathing treatments as we have outlined for her- REC to use NEB Tid, followed by Advair500Bid, Spiriva once dailyGFN400-2Tid w/ fluids, Singulair10, & Flutter valve/ postural drain/ purse-lip breathig treatments TID... Today we reviewed this regimen & added a Medrol Dosepak... She now appears to have signif GI-GERD/ reflux/ prob LPR & we added a vigorous antireflux regimen=> Prevacid30 taken 30 miin before dinner, NPO after dinner, elev HOB 6" blocks... She needs to see GI for further eval & needs these recommendations reinforced by them & her PCP...  ~  April 13, 2016:  40mo ROV & once again Kayla Thompson returns by herself for a f/u visit> she tells me that she is doing satis, no new complaints or concerns;  When asked about her meds she says they are all the same but she does not know the names or the dosing regimen;  She states her breathing is the same- min SOB w/o change- eg w/ walking (says lim by knee); notes mild cough/ sput- clear w/o color or blood seen, no CP;  Today's agenda involves getting a handicap placard- we filled it out for her... 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 We re-iterate the max regimen at each office visit & print it out on the AVS for family to review & help pt with...    Cardiac> on Coreg6.25- 1/2Bid for ?HBP; she has mild AS & DiastCHF on 2DEcho and this needs follow up w/ her PCP (DrCrawford) vs Cards consult ...    Endocrine> IDDM on Lantus & Novolog (A1c=6.3), HL on Crestor10 (FLP- controlled), Hypothyroid on Synthroid50, transiently elev serum calcium ?etiology (resolved spont), osteoporosis w/ mult fxs on Prolia Q67mo; managed by DrGherghe but last seen 06/09/15- note reviewed; she sees DrCrawford frequently...    GYN> Hx  ovarian cancer (in remission- details unknown), s/p hyst & Ca125=9 (08/2014)... She is followed by DrFernandez now...    Ortho/ Rheum> DJD w/ TKR, FM on Lyrica, Tramadol, Percocet,etc, severe LBP requires epidural shots; she is on a huge dose of Lyrica; PMR on Pred (details unknown); her Sed=52 and she saw [facility] for Rheum 03/23/16- 26mo rov for  severe OA, FM, PMR, & osteoporosis on Lyrica 300Bid, Pred 2mg /d, Percocet, & Prolia (last dose 02/24/16); she felt worse when out of her Lyrica    Neuro & Psyche> on Aricept5 per DrCrawford plus Buspar & Cymbalta- I wonder if she is taking any/ all of her meds properly, no care  giver accompanies pt to office visits, she is ?stoic ?oblivious...    Heme> Hg 10-11 range May2016 after fall w/ fx nose & wrist; similar now & may need further anemia work-up as well, deferred to PCP... EXAM showed Afeb, VSS, O2sat=95% on RA;  Wt=160#;  Heent- neg, mallampati3;  Chest- decr BS bilat, not congested, w/o w/r/r heard;  Heart- RR gr1/6SEM w/o r/g detected;  Abd- soft, nontender, +panniculus;  Ext- VI w/o c/c/e...   Ambulatory Oximetry 04/13/16>  O2sat=92% on RA at rest w/ pulse= 80/min... She ambulated 1 Lap in office (185') w/ O2sat drop to 84% with pulse=89/min... IMP/PLAN>>  Kayla Thompson has exercise induced hypoxemia & needs a POC w/ O2 at 2L/min w/ activity;  We will check an ONO in the interim to see if she needs nocturnal O2 as well;  In the meanwhile we once again reviewed w/ the pt regarding her MAX respiratory regimen-- Albut via NEB Tid, followed by Advair500Bid & Spiriva daily; continue GFN 400mg -2Tid w/ fluids; on Pred 2mg /d by rheum for PMR; on Singulair10 & flonase;  We also reviewed her antireflux regimen w/ Prevacid30 taken 30 min before dinner, NPO after dinner, elev HOB 6"... we plan another recheck in 43mo...  ~  July 12, 2016:  4mo ROV & Kayla Thompson is here by herself again today- no complaints or concerns- states her breathing is good, she uses her portable O2 as needed (she doesn't like the O2 conc- makes too much noise, gives her migraines, etc), denies CP/ palpt/ SOB/ edema; notes min cough small amt whitish sput, no hemoptysis; denies f/c/s and says she is active & exercising on her own (does not go to gym etc- we discussed silver sneakers etc...    Her PCP is DrCrawford, requested referral to Urology for urinary incont, given handicap placard for parking, wants a new eye doctor due to droopy eyelids (rec [facility]), pt says that [facility] checked her anemia- "it's good, not to worry"... We reviewed the following medical problems during today's office visit >>     Pulmonary> Hx COPD/  Bronchiectasis/ Abn CT Chest w/ mucoid impactions, tracheobronchomalacia w/ air trapping, 55mm LLL nodule, Hx granulomatous lung dis & reported atyp mycobacterium in the past>  See Hi-res CTChest 12/2014;  We encouraged a regular bronchodilator regimen (NEBS w/ Albut vs Duoneb Tid, ADVAIR500Bid, SPIRIVA daily), Guaifenesin 400mg -2Tid, Singulair10, Fluids, Flutter/ postural drainage/ purse-lip breathing exercises, & check sput for cultures but she could not be bothered & refused further pulm treatment/ testing sput/ etc=> still not cooperating w/ max chest physiotherapy regimen & states she only gets a sm amt of whitish sput (unable to successfully induce sput in office... We re-iterate the max regimen at each office visit & print it out on the AVS for family to review & help pt with...    Cardiac> on Coreg6.25-  1/2Bid for ?HBP; she has mild AS & DiastCHF on 2DEcho and this needs follow up w/ her PCP (DrCrawford) vs Cards consult ...    Endocrine> IDDM on Lantus & Novolog (A1c=6.3), HL on Crestor10 (FLP- controlled), Hypothyroid on Synthroid50, transiently elev serum calcium ?etiology (resolved spont), osteoporosis w/ mult fxs on Prolia Q11mo; managed by DrGherghe but last seen 06/09/15- note reviewed; she sees DrCrawford frequently...    GYN> Hx ovarian cancer (in remission- details unknown), s/p hyst & Ca125=9 (08/2014)... She is followed by DrFernandez now...    Ortho/ Rheum> DJD w/ TKR, FM on Lyrica, Tramadol, Percocet,etc, severe LBP requires epidural shots; she is on a huge dose of Lyrica; PMR on Pred (details unknown); her Sed=52 and she [facility] for [facility]um 03/23/16- 108mo rov for  severe OA, FM, PMR, & osteoporosis on Lyrica 300Bid, Pred 2mg /d, Percocet, & Prolia (last dose 02/24/16); she felt worse when out of her Lyrica    Neuro & Psyche> on Aricept5 per DrCrawford plus Buspar & Cymbalta- I wonder if she is taking any/ all of her meds properly, no care giver accompanies pt to office visits, she is ?stoic  ?oblivious...    Heme> Hg 10-11 range May2016 after fall w/ fx nose & wrist; similar now & may need further anemia work-up as well, deferred to PCP... EXAM showed Afeb, VSS, O2sat=97% on RA;  Wt=151#;  Heent- neg, mallampati3;  Chest- decr BS bilat, not congested, w/o w/r/r heard;  Heart- RR gr1/6SEM w/o r/g detected;  Abd- soft, nontender, +panniculus;  Ext- VI w/o c/c/e...   Ambulatory Oximetry 07/12/16>  O2sat=94% on RA at rest w/ pulse=97/min;  She ambulated 2 laps in office (185'ea) w/ lowest O2sat=88% w/ pulse=112/min;  This is improved from [id number];  Advised O2 at 2L/min w/ activities...  IMP/PLAN>>  Pt is asked to use O2 w/ ambulation & reminded again about the recommended pulm Rx- NEBS w/ Albut vs Duoneb Tid, ADVAIR500Bid, SPIRIVA daily, Guaifenesin 400mg -2Tid, Singulair10, Fluids, Flutter/ postural drainage/ purse-lip breathing exercises; call for any breathing issues, rov ~32mo...  ~  December 01, 2016:  62mo ROV & Kayla Thompson is here by herself again today (daughter brought her but is waiting in the car per pt)>  She was referred by DrCrawford for pulm eval due to cough & dyspnea, records from [facility] indicated a hx of asthma/ bronchiectasis/ mult pulm nodules, she had left lung surg in 2000 for a benign granuloma & there was a question of an atypical mycobacterium; she is rather sedentary & poorly conditioned; she is a never smoker w/ 2nd hand smoke exposure;  CT Chest 10/16 showed> Norm heart size, atherosclerosis of Ao/ great vessels/ coronaries, no adenopathy, atrophic thyroid; ?small sliding pulm hernia betw the lat 7th & 8th ribs w/ scarring; extensive bronchiectasis bilat w/ mucoid impactions within dilated airways (infectious/inflamm bronchiolitis- r/o MAI), 80mm LLL nodule, tracheobronchomalacia w/ extensive bilat air trapping on expiration, [place], old healed right 6th-7th rib fx, DJD Tspine... She has demonstrated exercise hypoxemia & never did the requested ONO- she refuses to use her Port O2  stating "I have allergies" and uses it prn only; she lives w/ daughter but she never comes back to the exam room w/ her mother during the OVs; pt says she rests well, not SOB, breathing well & denies cough/ sput/ hemoptysis, CP, f/c/s, etc... We have stressed a MAX bronchodil regimen w/ NEBS w/ Albut vs Duoneb Tid, ADVAIR500Bid, SPIRIVA daily, Guaifenesin 400mg -2Tid, Singulair10, Fluids, Flutter/ postural drainage/ purse-lip breathing exercises and an antireflux  regimen...     She has numerous medical specialists per Epic review>  Ophthalmology- DrShapiro, sent to oculofacial plastic surg DrWhite, Endocrine- DrGherghe, [place], PCP- DrCrawford, on Prolia q10mo... EXAM showed Afeb, VSS, O2sat=100% on RA;  Wt=144#;  Heent- neg, mallampati3;  Chest- decr BS bilat, not congested, w/o w/r/r heard;  Heart- RR gr1/6SEM w/o r/g detected;  Abd- soft, nontender, +panniculus;  Ext- VI w/o c/c/e...  IMP/PLAN>>  She is reminded about her mandatory pulm regimen & this is again written for her to share w/ family/ care givers but she remains unconcerned, indifferent, not motivated...    ~  February 24, 2017:  95mo ROV & add-on appt> pt has returned for oxygen recertification per [employer]> Their letter indicates that a new CMN is needed after 51mo of service & requires re-eval within 90d of the new medicare oxygen CMN... SEE ABOVE NOTES-- She was referred by DrCrawford for pulm eval due to cough & dyspnea, records from [facility] indicated a hx of asthma/ bronchiectasis/ mult pulm nodules, she had left lung surg in 2000 for a benign granuloma & there was a question of an atypical mycobacterium; she is rather sedentary & poorly conditioned; she is a never smoker w/ 2nd hand smoke exposure;  CT Chest 10/16 showed> Norm heart size, atherosclerosis of Ao/ great vessels/ coronaries, no adenopathy, atrophic thyroid; ?small sliding pulm hernia betw the lat 7th & 8th ribs w/ scarring; extensive bronchiectasis bilat w/ mucoid impactions  within dilated airways (infectious/inflamm bronchiolitis- r/o MAI), 22mm LLL nodule, tracheobronchomalacia w/ extensive bilat air trapping on expiration, [place], old healed right 6th-7th rib fx, DJD Tspine... She has demonstrated exercise hypoxemia & never did the requested ONO- she refuses to use her Port O2 stating "I have allergies" and uses it prn only; she lives w/ daughter but she never comes back to the exam room w/ her mother during the OVs; pt says she rests well, not SOB, breathing well & denies cough/ sput/ hemoptysis, CP, f/c/s, etc... We have stressed a MAX bronchodil regimen w/ NEBS w/ Albut vs Duoneb Tid, ADVAIR500Bid, SPIRIVA daily, Guaifenesin 400mg -2Tid, Singulair10, Fluids, Flutter/ postural drainage/ purse-lip breathing exercises and an antireflux regimen...  EXAM showed Afeb, VSS, O2sat=100% on RA;  Wt=144#;  Heent- neg, mallampati3;  Chest- decr BS bilat, not congested, w/o w/r/r heard;  Heart- RR gr1/6SEM w/o r/g detected;  Abd- soft, nontender, +panniculus;  Ext- VI w/o c/c/e...   Ambulatory oximetry 02/24/17>  O2sat=96% on RA at rest w/ pulse=87/min;  She ambulated 3 laps (185'ea) in the office w/ nadir O2sat=88% w/ pulse=113/min IMP/PLAN>>  She has an O2 conc at home & not using it, won't use it at night (too noisey, the cord is yoo long, she's NEVER usedit she says);  [employer]- uses "as needed" "I like it" she says, averages once per week "to give myself a breather";  She didn't take it on her trip to [facility], not using it when out & about, uses it some when doing PT exercises... She is again reminded to use the O2 at 1L/min Qhs & should check w/ DME [employer] regarding the cord & machine noise;  Advised to use the O2 at 2L/min w/ ambulation;  Continue Rx w/ NEB rx Tid followed by Advair250Bid & Spiriva once daily, mucinex 1200Bid + fluids, and the antireflux regimen- Axid150 ~78min before dinner, NPO after dinner, elev HOB 6", ok Zantac150 at bedtime w/ sip of water... We plan ROV in  65mo w/ CXR...    Past Medical  History:  Diagnosis Date  . Arthritis   . Asthma   . Diabetes mellitus without complication ([place])   . Fibromyalgia   . GERD (gastroesophageal reflux disease)   . History of chemotherapy   . History of fractured pelvis   . Hypertension   . Osteoporosis   . Osteoporosis   . Ovarian cancer [facility])     Past Surgical History  Procedure Laterality Date  . Hip surgey    . Knee surgey    . Lung surgery >> resection of nodule 2000, proved to be a benign granuloma & ?atyp mycobacterium ident at the same time?    . Bladder suspension    . Ankle fracture surgery      plate and screws  . Abdominal hysterectomy  1996    Outpatient Encounter Medications as of 02/24/2017  Medication Sig  . albuterol (PROVENTIL HFA;VENTOLIN HFA) 108 (90 Base) MCG/ACT inhaler Inhale 2 puffs into the lungs every 6 (six) hours as needed for wheezing or shortness of breath.  Marland Kitchen albuterol (PROVENTIL) (2.5 MG/3ML) 0.083% nebulizer solution Take 3 mLs (2.5 mg total) by nebulization daily.  Marland Kitchen Apoaequorin (PREVAGEN) 10 MG CAPS Take 10 m by mouth daily.  . BD PEN NEEDLE NANO U/F 32G X 4 MM MISC USE 4 TIMES A DAY  . carvedilol (COREG) 6.25 MG tablet Take 3.125 mg by mouth 2 (two) times daily with a meal.   . denosumab (PROLIA) 60 MG/ML SOLN injection Inject 60 mg into the skin every 6 (six) months. Administer in upper arm, thigh, or abdomen  . Dexlansoprazole 30 MG capsule Take 1 capsule (30 mg total) by mouth daily.  Marland Kitchen Dextromethorphan-Guaifenesin (MUCINEX DM MAXIMUM STRENGTH) 60-1200 MG TB12 Take 1 tablet by mouth 2 (two) times daily as needed (for cough/phlegm). Reported on 06/23/2015  . donepezil (ARICEPT) 5 MG tablet TAKE 1 TABLET (5 MG TOTAL) BY MOUTH AT BEDTIME.  . DULoxetine (CYMBALTA) 60 MG capsule Take 60 mg by mouth daily.  . fluticasone (FLONASE) 50 MCG/ACT nasal spray Place 1-2 sprays into both nostrils daily as needed for allergies or rhinitis.  . Fluticasone-Salmeterol (ADVAIR  DISKUS) 500-50 MCG/DOSE AEPB Inhale 1 puff into the lungs 2 (two) times daily.  Marland Kitchen glucose blood (FREESTYLE TEST STRIPS) test strip Use to test blood sugar 3 times daily. Dx: E11.65  . Insulin Glargine (LANTUS SOLOSTAR) 100 UNIT/ML Solostar Pen INJECT 20 UNITS SUBCUTANEOUSLY EVERY MORNING  . insulin lispro (HUMALOG KWIKPEN) 100 UNIT/ML KiwkPen Inject 4-6 units into the skin daily.  . Lancets (FREESTYLE) lancets Use to test blood sugar 3 times daily. Dx: E11.65  . LANTUS SOLOSTAR 100 UNIT/ML Solostar Pen INJECT 30 UNITS SUBCUTANEOUSLY EVERY MORNING  . levothyroxine (SYNTHROID, LEVOTHROID) 50 MCG tablet Take 1 tablet (50 mcg total) by mouth daily before breakfast.  . montelukast (SINGULAIR) 10 MG tablet TAKE 1 TABLET (10 MG TOTAL) BY MOUTH DAILY.  . NON FORMULARY 1 Syringe by Epidural route every 3 (three) months. Epidural Steroid Injections for Lumbar Spinal Stenosis up to 4 times a year  . oxyCODONE-acetaminophen (PERCOCET/ROXICET) 5-325 MG tablet Take 1 tablet by mouth 2 (two) times daily as needed for severe pain.  . predniSONE (DELTASONE) 1 MG tablet Take 2 mg by mouth daily with breakfast.   . pregabalin (LYRICA) 300 MG capsule Take 1 capsule (300 mg total) by mouth 2 (two) times daily.  . ranitidine (ZANTAC) 150 MG tablet Take 150 mg by mouth 2 (two) times daily as needed for heartburn.  . rosuvastatin (CRESTOR)  10 MG tablet Take 1 tablet (10 mg total) by mouth every evening.  . tiotropium (SPIRIVA HANDIHALER) 18 MCG inhalation capsule Place 1 capsule (18 mcg total) into inhaler and inhale daily.  Marland Kitchen zolpidem (AMBIEN) 5 MG tablet Take 1 tablet (5 mg total) by mouth at bedtime.   No facility-administered encounter medications on file as of 02/24/2017.     Allergies  Allergen Reactions  . Latex Rash  . Penicillins Rash     Immunization History  Administered Date(s) Administered  . Influenza, High Dose Seasonal PF 12/01/2016  . Influenza,inj,Quad PF,6+ Mos 01/17/2015, 12/18/2015  .  Pneumococcal Polysaccharide-23 12/18/2015  . Tdap 08/02/2014    Current Medications, Allergies, Past Medical History, Past Surgical History, Family History, and Social History were reviewed in [employer] record.   Review of Systems             All symptoms NEG except where BOLDED >>  Constitutional:  F/C/S, fatigue, anorexia, unexpected weight change. HEENT:  HA, visual changes, hearing loss, earache, nasal symptoms, sore throat, mouth sores, hoarseness. Resp:  cough, sputum, hemoptysis; SOB, tightness, wheezing. Cardio:  CP, palpit, DOE, orthopnea, edema. GI:  N/V/D/C, blood in stool; reflux, abd pain, distention, gas. GU:  dysuria, freq, urgency, hematuria, flank pain, voiding difficulty. MS:  joint pain, swelling, tenderness, decr ROM; neck pain, back pain, etc. Neuro:  HA, tremors, seizures, dizziness, syncope, weakness, numbness, gait abn. Skin:  suspicious lesions or skin rash. Heme:  adenopathy, bruising, bleeding. Psyche:  confusion, agitation, sleep disturbance, hallucinations, anxiety, depression suicidal.   Objective:   Physical Exam       Vital Signs:  Reviewed...   General:  WD, WN, 81 y/o WF in NAD; alert & oriented; pleasant & cooperative... HEENT:  [place]/AT; Conjunctiva- pink, Sclera- nonicteric, EOM-wnl, PERRLA, EACs-clear, TMs-wnl; NOSE-clear; THROAT-clear & wnl.  Neck:  Supple w/ fairROM; no JVD; normal carotid impulses w/o bruits; no thyromegaly or nodules palpated; no lymphadenopathy.  Chest:  Decr BS bilat, few scat rhonchi otherw clear not congested no wheezing rales or signs of consolidation... Heart:  Regular Rhythm; norm S1 & S2 w/ Gr1/6 SEM w/o rubs or gallops detected... Abdomen:  Soft & nontender, +panniculus- no guarding or rebound; normal bowel sounds; no organomegaly or masses palpated. Ext:  decrROM; without deformities +arthritic changes; +venous insuffic, no edema;  Pulses intact w/o bruits. Neuro:  No focal neuro deficit; abn  gait & balance poor, uses cane... Derm:  No lesions noted; no rash etc. Lymph:  No cervical, supraclavicular, axillary, or inguinal adenopathy palpated.   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    Cardiac> on Coreg6.25- 1/2Bid for ?HBP; she has mild AS & DiastCHF on 2DEcho and this needs follow up w/ her PCP vs Cards...    Endocrine> IDDM on Lantus & Novolog (A1c=6.3), HL on Crestor10 (  FLP- controlled), Hypothyroid on Synthroid50, transiently elev serum calcium ?etiology (resolved spont), osteoporosis w/ mult fxs on Prolia Q79mo; managed by DrGherghe & last seen 06/09/15- note reviewed...    GI> She presented w/ symptoms of reflux/ LPR w/ cough while eating etc => needs GI eval & we will refer (see recs below)...    GYN> Hx ovarian cancer (in remission- details unknown), s/p hyst & Ca125=9 (08/2014)... She is followed by DrFernandez now...    Ortho/ Rheum> DJD w/ TKR, FM on Lyrica, Tramadol, Percocet,etc, severe LBP requires epidural shots; she is on a huge dose of Lyrica; PMR on Pred (details unknown); her Sed=52 and she saw [facility] for Rheum last 09/03/15- severe OA, FM, PMR, & osteoporosis on Lyrica, Pred, Percocet, & Prolia; she felt worse when out of her Lyrica    Neuro> on Aricept5 per DrCrawford, I  wonder if she is taking any/ all of her meds properly, no care giver accompanies pt to office visits, she is ?stoic ?oblivious...    Heme> Hg 10-11 range May2016 after fall w/ fx nose & wrist; similar now & may need further anemia work-up as well, deferred to PCP...  PLAN >>   01/19/15>   We discussed Rx w/ ZPak for her URI (it could also help her if she has atyp-mycobacterium); we compromised rx w/ NEBS TID- followed by AdvairBid & Spiriva once daily, plus the MUCINEX1200mg Bid w/ fluids;  She again declines the added benefit from a vigorous chest PT regimen, flutter valve, postural drainage, purse lip breathing, etc... 06/23/15>   We again outlined for Kayla Thompson a vigorous home treatment regimen for her bronchiectasis & mucoid impactions including a TID regimen>  NEB w/ Albut vs Duoneb Tid, followed by her Advair500Bid & Spiriva once/d, Guaifenesin 400mg  tabs- 2Tid w/ fluids, take her low dose Pred in AM & Singulair in PM; she declines to do chest PT/ Flutter & postural drainage treatments... 10/01/15>   Kayla Thompson has a URI & mild COPD exac=> we will treat w/ Doxy100Bid (her request) and Medrol dosepak;  We again reviewed the recommended treatment protocol for her bronchiectasis (it was like she heard it for the 1st time)>  NEBS Tid, followed by [facility] & SPIRIVA daily, +Guaifenesin400-2Tid + Fluids, take the low dose Pred in AM (per Rheum) and the Singulair10 in PM, work to expectorate the phlegm but she declines to do chest PT, Flutter, postural drainage, etc... we plan ROV recheck in 70mo... 12/25/15>   Kayla Thompson has serious multisys dis & is managed by DrCrawford-PCP;  From the pulm standpoint she continues to downplay the severity of her lung dis & still refuses/ forgets/ etc to do her breathing treatments as we have outlined for her- REC to use NEB Tid, followed by Advair500Bid, Spiriva once dailyGFN400-2Tid w/ fluids, Singulair10, & Flutter valve/ postural drain/ purse-lip breathig treatments TID... Today  we reviewed this regimen & added a Medrol Dosepak... She now appears to have signif GI-GERD/ reflux/ prob LPR & we added a vigorous antireflux regimen=> Prevacid30 taken 30 miin before dinner, NPO after dinner, elev HOB 6" blocks... She needs to see GI for further eval & needs these recommendations reinforced by them & her PCP. 04/13/16>   Zykira has exercise induced hypoxemia & needs a POC w/ O2 at 2L/min w/ activity;  We will check an ONO in the interim to see if she needs nocturnal O2 as well;  In the meanwhile we once again reviewe4d w/ the pt regarding her MAX respiratory regimen-- Albut via NEB Tid,  followed by Advair500Bid & Spiriva daily; continue GFN 400mg -2Tid w/ fluids; on Pred 2mg /d by rheum for PMR; on Singulair10 & flonase;  We also reviewed her antireflux regimen w/ Prevacid30 taken 30 min before dinner, NPO after dinner, elev HOB 6" 07/12/16>   Pt is asked to use O2 w/ ambulation & reminded again about the recommended pulm Rx- NEBS w/ Albut vs Duoneb Tid, ADVAIR500Bid, SPIRIVA daily, Guaifenesin 400mg -2Tid, Singulair10, Fluids, Flutter/ postural drainage/ purse-lip breathing exercises; call for any breathing issues, rov ~49mo 12/01/16>   She is reminded about her mandatory pulm regimen & this is again written for her to share w/ family/ care givers but she remains unconcerned, indifferent, not motivated 02/24/17>   Sent by [employer] for O2 recert...   Plan:       Medication List        Accurate as of 02/24/17  3:20 PM. Always use your most recent med list.          * albuterol (2.5 MG/3ML) 0.083% nebulizer solution Commonly known as:  PROVENTIL Take 3 mLs (2.5 mg total) by nebulization daily.   * albuterol 108 (90 Base) MCG/ACT inhaler Commonly known as:  PROVENTIL HFA;VENTOLIN HFA Inhale 2 puffs into the lungs every 6 (six) hours as needed for wheezing or shortness of breath.   Apoaequorin 10 MG Caps Commonly known as:  PREVAGEN Take 10 m by mouth daily.   BD PEN NEEDLE NANO  U/F 32G X 4 MM Misc Generic drug:  Insulin Pen Needle USE 4 TIMES A DAY   carvedilol 6.25 MG tablet Commonly known as:  COREG   denosumab 60 MG/ML Soln injection Commonly known as:  PROLIA   Dexlansoprazole 30 MG capsule Take 1 capsule (30 mg total) by mouth daily.   donepezil 5 MG tablet Commonly known as:  ARICEPT TAKE 1 TABLET (5 MG TOTAL) BY MOUTH AT BEDTIME.   DULoxetine 60 MG capsule Commonly known as:  CYMBALTA   fluticasone 50 MCG/ACT nasal spray Commonly known as:  FLONASE Place 1-2 sprays into both nostrils daily as needed for allergies or rhinitis.   Fluticasone-Salmeterol 500-50 MCG/DOSE Aepb Commonly known as:  ADVAIR DISKUS Inhale 1 puff into the lungs 2 (two) times daily.   freestyle lancets Use to test blood sugar 3 times daily. Dx: E11.65   glucose blood test strip Commonly known as:  FREESTYLE TEST STRIPS Use to test blood sugar 3 times daily. Dx: E11.65   * Insulin Glargine 100 UNIT/ML Solostar Pen Commonly known as:  LANTUS SOLOSTAR INJECT 20 UNITS SUBCUTANEOUSLY EVERY MORNING   * LANTUS SOLOSTAR 100 UNIT/ML Solostar Pen Generic drug:  Insulin Glargine INJECT 30 UNITS SUBCUTANEOUSLY EVERY MORNING   insulin lispro 100 UNIT/ML KiwkPen Commonly known as:  HUMALOG KWIKPEN Inject 4-6 units into the skin daily.   levothyroxine 50 MCG tablet Commonly known as:  SYNTHROID, LEVOTHROID Take 1 tablet (50 mcg total) by mouth daily before breakfast.   montelukast 10 MG tablet Commonly known as:  SINGULAIR TAKE 1 TABLET (10 MG TOTAL) BY MOUTH DAILY.   MUCINEX DM MAXIMUM STRENGTH 60-1200 MG Tb12   NON FORMULARY   oxyCODONE-acetaminophen 5-325 MG tablet Commonly known as:  PERCOCET/ROXICET Take 1 tablet by mouth 2 (two) times daily as needed for severe pain.   predniSONE 1 MG tablet Commonly known as:  DELTASONE   pregabalin 300 MG capsule Commonly known as:  LYRICA Take 1 capsule (300 mg total) by mouth 2 (two) times daily.   ranitidine 150  MG  tablet Commonly known as:  ZANTAC   rosuvastatin 10 MG tablet Commonly known as:  CRESTOR Take 1 tablet (10 mg total) by mouth every evening.   tiotropium 18 MCG inhalation capsule Commonly known as:  SPIRIVA HANDIHALER Place 1 capsule (18 mcg total) into inhaler and inhale daily.   zolpidem 5 MG tablet Commonly known as:  AMBIEN Take 1 tablet (5 mg total) by mouth at bedtime.      * This list has 4 medication(s) that are the same as other medications prescribed for you. Read the directions carefully, and ask your doctor or other care provider to review them with you.

## 2017-03-04 ENCOUNTER — Telehealth: Payer: Self-pay | Admitting: Pulmonary Disease

## 2017-03-04 ENCOUNTER — Encounter: Payer: Self-pay | Admitting: Internal Medicine

## 2017-03-04 ENCOUNTER — Ambulatory Visit (INDEPENDENT_AMBULATORY_CARE_PROVIDER_SITE_OTHER): Payer: Medicare Other | Admitting: Internal Medicine

## 2017-03-04 DIAGNOSIS — R42 Dizziness and giddiness: Secondary | ICD-10-CM

## 2017-03-04 MED ORDER — FLUTICASONE-SALMETEROL 500-50 MCG/DOSE IN AEPB: 1.0000 | INHALATION_SPRAY | Freq: Two times a day (BID) | RESPIRATORY_TRACT | 3 refills | Status: DC

## 2017-03-04 MED ORDER — ALBUTEROL SULFATE HFA 108 (90 BASE) MCG/ACT IN AERS: 2.0000 | INHALATION_SPRAY | Freq: Four times a day (QID) | RESPIRATORY_TRACT | 11 refills | Status: DC | PRN

## 2017-03-04 MED ORDER — GUAIFENESIN-CODEINE 100-10 MG/5ML PO SYRP: 5.0000 mL | ORAL_SOLUTION | Freq: Four times a day (QID) | ORAL | 0 refills | Status: DC | PRN

## 2017-03-04 NOTE — Telephone Encounter (Signed)
Per SN: Ok to fill Cheratussin AC #29ml 1 tsp po q6h prn cough.  rx printed and left up front for pickup.  Pt aware.  Nothing further needed.

## 2017-03-04 NOTE — Assessment & Plan Note (Signed)
Likely due to Lisbeth Ply which is common side effect. Since it is not effective ask her to stop taking and have removed from her list. If symptoms do not resolve with medication change would be reasonable to do some lab work. Will not repeat today.

## 2017-03-04 NOTE — Telephone Encounter (Signed)
Spoke with pt, she states she is coughing with mucus and would like to get a Rx for cough medication, hydrocodone syrup. She states SN has written this before and uses it as needed for cough. SN please advise on refill.

## 2017-03-04 NOTE — Progress Notes (Signed)
   Subjective:    Patient ID: Kayla Thompson, female    DOB: August 21, 1935, 82 y.o.   MRN: [id number]  HPI The patient is a 81 YO female coming in for dizziness. Going on since starting [place] although she is not sure that is related. Started Lisbeth Ply about 4 weeks ago and does not think it is helping her bladder problems at all. She denies other medication changes or dose adjustments. She is not exercising in the last 1-2 weeks and this is making her hurt more. Denies headaches or recent falls. She denies numbness, weakness, speech changes. Denies syncope. Denies worse with certain head movements.   Review of Systems  Constitutional: Positive for activity change and fatigue. Negative for appetite change, fever and unexpected weight change.  Respiratory: Negative for cough, chest tightness and shortness of breath.   Cardiovascular: Negative for chest pain, palpitations and leg swelling.  Gastrointestinal: Negative for abdominal distention, abdominal pain, constipation, diarrhea, nausea and vomiting.  Musculoskeletal: Positive for arthralgias, joint swelling and myalgias.  Skin: Negative.   Neurological: Positive for dizziness and light-headedness. Negative for seizures, speech difficulty, weakness and numbness.  Psychiatric/Behavioral: Negative.       Objective:   Physical Exam  Constitutional: She is oriented to person, place, and time. She appears well-developed and well-nourished.  HENT:  Head: Normocephalic and atraumatic.  Eyes: EOM are normal.  Neck: Normal range of motion.  Cardiovascular: Normal rate and regular rhythm.  Pulmonary/Chest: Effort normal and breath sounds normal. No respiratory distress. She has no wheezes. She has no rales.  Abdominal: Soft. Bowel sounds are normal. She exhibits no distension. There is no tenderness. There is no rebound.  Musculoskeletal: She exhibits tenderness. She exhibits no edema.  Neurological: She is alert and oriented to person, place, and time.  Coordination normal.  Skin: Skin is warm and dry.   Vitals:   03/04/17 1340  BP: 110/70  Pulse: 89  Temp: 98.4 F (36.9 C)  TempSrc: Oral  SpO2: 98%  Weight: 140 lb (63.5 kg)  Height: 5' (1.524 m)      Assessment & Plan:

## 2017-03-04 NOTE — Patient Instructions (Addendum)
We have sent in the albuterol and advair.  Do not take more than 3000 mg of the tylenol daily.   Stop taking Kayla Thompson as it can be causing the dizziness.

## 2017-03-09 DIAGNOSIS — M13812 Other specified arthritis, left shoulder: Secondary | ICD-10-CM | POA: Diagnosis not present

## 2017-03-09 DIAGNOSIS — M25512 Pain in left shoulder: Secondary | ICD-10-CM | POA: Diagnosis not present

## 2017-03-11 ENCOUNTER — Telehealth: Payer: Self-pay | Admitting: Pulmonary Disease

## 2017-03-11 DIAGNOSIS — H2513 Age-related nuclear cataract, bilateral: Secondary | ICD-10-CM | POA: Diagnosis not present

## 2017-03-11 DIAGNOSIS — H25013 Cortical age-related cataract, bilateral: Secondary | ICD-10-CM | POA: Diagnosis not present

## 2017-03-11 MED ORDER — HYDROCODONE-HOMATROPINE 5-1.5 MG/5ML PO SYRP: 5.0000 mL | ORAL_SOLUTION | Freq: Four times a day (QID) | ORAL | 0 refills | Status: DC | PRN

## 2017-03-11 NOTE — Telephone Encounter (Signed)
Spoke with pt. We gave her Cheratussin cough syrup last week. This isn't working for her. Pt would like a prescription for Hycodan.  SN - please advise. Thanks.

## 2017-03-11 NOTE — Telephone Encounter (Signed)
Per SN >> okay to refill Hycodan 8oz 1 tsp po q6h prn cough.  Rx has been printed, signed and placed up front for pick up. Pt is aware. Nothing further was needed.

## 2017-03-17 DIAGNOSIS — M19012 Primary osteoarthritis, left shoulder: Secondary | ICD-10-CM | POA: Diagnosis not present

## 2017-03-17 DIAGNOSIS — M25562 Pain in left knee: Secondary | ICD-10-CM | POA: Diagnosis not present

## 2017-03-17 DIAGNOSIS — M1712 Unilateral primary osteoarthritis, left knee: Secondary | ICD-10-CM | POA: Diagnosis not present

## 2017-03-17 DIAGNOSIS — M25512 Pain in left shoulder: Secondary | ICD-10-CM | POA: Diagnosis not present

## 2017-03-23 DIAGNOSIS — H25013 Cortical age-related cataract, bilateral: Secondary | ICD-10-CM | POA: Diagnosis not present

## 2017-03-23 DIAGNOSIS — H2513 Age-related nuclear cataract, bilateral: Secondary | ICD-10-CM | POA: Diagnosis not present

## 2017-03-27 ENCOUNTER — Other Ambulatory Visit: Payer: Self-pay | Admitting: Internal Medicine

## 2017-03-30 DIAGNOSIS — H2512 Age-related nuclear cataract, left eye: Secondary | ICD-10-CM | POA: Diagnosis not present

## 2017-03-30 DIAGNOSIS — H25012 Cortical age-related cataract, left eye: Secondary | ICD-10-CM | POA: Diagnosis not present

## 2017-04-05 DIAGNOSIS — R350 Frequency of micturition: Secondary | ICD-10-CM | POA: Diagnosis not present

## 2017-04-06 ENCOUNTER — Ambulatory Visit: Payer: Self-pay | Admitting: *Deleted

## 2017-04-06 ENCOUNTER — Telehealth: Payer: Self-pay | Admitting: Internal Medicine

## 2017-04-06 NOTE — Telephone Encounter (Signed)
Opened in error. Converting to Telephone encounter.

## 2017-04-06 NOTE — Telephone Encounter (Signed)
Kayla Thompson phoned to say she was having the "jitters and tremors" from her medication, Lyrica. She stated she has just started back on the medication after not having taken it for about 4 weeks. Today is day #2 of starting back up with Lyrica 300mg  daily for fibromyalgia. Stated she no longer wants to take this medication and is requesting a different medication for fibromyalgia, please. Please advise. Preferred Pharmacy on file.

## 2017-04-07 ENCOUNTER — Ambulatory Visit: Payer: Medicare Other | Admitting: Internal Medicine

## 2017-04-07 ENCOUNTER — Emergency Department (HOSPITAL_BASED_OUTPATIENT_CLINIC_OR_DEPARTMENT_OTHER)
Admission: EM | Admit: 2017-04-07 | Discharge: 2017-04-07 | Disposition: A | Discharge status: Home / Self Care | Payer: Medicare Other | Attending: Emergency Medicine | Admitting: Emergency Medicine

## 2017-04-07 ENCOUNTER — Emergency Department (HOSPITAL_BASED_OUTPATIENT_CLINIC_OR_DEPARTMENT_OTHER): Payer: Medicare Other

## 2017-04-07 ENCOUNTER — Encounter (HOSPITAL_BASED_OUTPATIENT_CLINIC_OR_DEPARTMENT_OTHER): Payer: Self-pay

## 2017-04-07 ENCOUNTER — Other Ambulatory Visit: Payer: Self-pay

## 2017-04-07 DIAGNOSIS — M25522 Pain in left elbow: Secondary | ICD-10-CM | POA: Diagnosis not present

## 2017-04-07 DIAGNOSIS — Z91040 Latex allergy status: Secondary | ICD-10-CM | POA: Diagnosis not present

## 2017-04-07 DIAGNOSIS — Z794 Long term (current) use of insulin: Secondary | ICD-10-CM | POA: Diagnosis not present

## 2017-04-07 DIAGNOSIS — S299XXA Unspecified injury of thorax, initial encounter: Secondary | ICD-10-CM | POA: Diagnosis not present

## 2017-04-07 DIAGNOSIS — Z23 Encounter for immunization: Secondary | ICD-10-CM | POA: Insufficient documentation

## 2017-04-07 DIAGNOSIS — I110 Hypertensive heart disease with heart failure: Secondary | ICD-10-CM | POA: Insufficient documentation

## 2017-04-07 DIAGNOSIS — Y999 Unspecified external cause status: Secondary | ICD-10-CM | POA: Insufficient documentation

## 2017-04-07 DIAGNOSIS — W0110XA Fall on same level from slipping, tripping and stumbling with subsequent striking against unspecified object, initial encounter: Secondary | ICD-10-CM | POA: Insufficient documentation

## 2017-04-07 DIAGNOSIS — Y9301 Activity, walking, marching and hiking: Secondary | ICD-10-CM | POA: Diagnosis not present

## 2017-04-07 DIAGNOSIS — J45909 Unspecified asthma, uncomplicated: Secondary | ICD-10-CM | POA: Insufficient documentation

## 2017-04-07 DIAGNOSIS — R0781 Pleurodynia: Secondary | ICD-10-CM | POA: Diagnosis not present

## 2017-04-07 DIAGNOSIS — Z79899 Other long term (current) drug therapy: Secondary | ICD-10-CM | POA: Diagnosis not present

## 2017-04-07 DIAGNOSIS — Y929 Unspecified place or not applicable: Secondary | ICD-10-CM | POA: Insufficient documentation

## 2017-04-07 DIAGNOSIS — S51812A Laceration without foreign body of left forearm, initial encounter: Secondary | ICD-10-CM | POA: Diagnosis not present

## 2017-04-07 DIAGNOSIS — E119 Type 2 diabetes mellitus without complications: Secondary | ICD-10-CM | POA: Insufficient documentation

## 2017-04-07 DIAGNOSIS — S51012A Laceration without foreign body of left elbow, initial encounter: Secondary | ICD-10-CM | POA: Diagnosis not present

## 2017-04-07 DIAGNOSIS — S59902A Unspecified injury of left elbow, initial encounter: Secondary | ICD-10-CM | POA: Diagnosis not present

## 2017-04-07 DIAGNOSIS — E039 Hypothyroidism, unspecified: Secondary | ICD-10-CM | POA: Diagnosis not present

## 2017-04-07 DIAGNOSIS — S41112A Laceration without foreign body of left upper arm, initial encounter: Secondary | ICD-10-CM

## 2017-04-07 DIAGNOSIS — R0789 Other chest pain: Secondary | ICD-10-CM | POA: Diagnosis not present

## 2017-04-07 DIAGNOSIS — I5032 Chronic diastolic (congestive) heart failure: Secondary | ICD-10-CM | POA: Diagnosis not present

## 2017-04-07 DIAGNOSIS — W19XXXA Unspecified fall, initial encounter: Secondary | ICD-10-CM

## 2017-04-07 MED ORDER — HYDROCODONE-ACETAMINOPHEN 5-325 MG PO TABS: 1.0000 | ORAL_TABLET | ORAL | 0 refills | Status: DC | PRN

## 2017-04-07 MED ORDER — TETANUS-DIPHTH-ACELL PERTUSSIS 5-2.5-18.5 LF-MCG/0.5 IM SUSP
0.5000 mL | Freq: Once | INTRAMUSCULAR | Status: AC
  Administered 2017-04-07: 0.5 mL via INTRAMUSCULAR
  Filled 2017-04-07: qty 0.5

## 2017-04-07 MED ORDER — LIDOCAINE-EPINEPHRINE 2 %-1:100000 IJ SOLN
20.0000 mL | Freq: Once | INTRAMUSCULAR | Status: AC
  Administered 2017-04-07: 1 mL via INTRADERMAL
  Filled 2017-04-07: qty 1

## 2017-04-07 MED ORDER — DOXYCYCLINE HYCLATE 100 MG PO TABS
100.0000 mg | ORAL_TABLET | Freq: Once | ORAL | Status: AC
  Administered 2017-04-07: 100 mg via ORAL
  Filled 2017-04-07: qty 1

## 2017-04-07 MED ORDER — DOXYCYCLINE HYCLATE 100 MG PO CAPS: 100.0000 mg | ORAL_CAPSULE | Freq: Two times a day (BID) | ORAL | 0 refills | Status: DC

## 2017-04-07 NOTE — ED Notes (Signed)
Family at bedside. 

## 2017-04-07 NOTE — ED Notes (Signed)
ED Provider at bedside. 

## 2017-04-07 NOTE — ED Triage Notes (Signed)
Pt states she tripped/fell into coffee table last night -pain to left rib area and left elbow-large lac noted to elbow area-gauze/kling applied-pt denies head/neck pain/injury-denies LOC

## 2017-04-07 NOTE — Telephone Encounter (Signed)
There really are no good alternatives to the lyrica for her. She should not have started back on such a high dose of lyrica after stopping. This medication has to be gradually increased and this may be why she is having problems. We can start her back on small dose if she wants.

## 2017-04-07 NOTE — ED Provider Notes (Signed)
[facility] EMERGENCY DEPARTMENT Provider Note   CSN: [id number] Arrival date & time: 04/07/17  1302     History   Chief Complaint Chief Complaint  Patient presents with  . Fall    HPI Kayla Thompson is a 82 y.o. female.  The history is provided by the patient, a relative and medical records. No language interpreter was used.  Fall  This is a new problem. The current episode started yesterday (over 24 hours ago). The problem occurs constantly. The problem has not changed since onset.Associated symptoms include chest pain. Pertinent negatives include no abdominal pain, no headaches and no shortness of breath. The symptoms are aggravated by coughing. Nothing relieves the symptoms. She has tried nothing for the symptoms. The treatment provided no relief.    Past Medical History:  Diagnosis Date  . Arthritis   . Asthma   . Diabetes mellitus without complication ([place])   . Fibromyalgia   . GERD (gastroesophageal reflux disease)   . History of chemotherapy   . History of fractured pelvis   . Hypertension   . Osteoporosis   . Osteoporosis   . Ovarian cancer [facility])     Patient Active Problem List   Diagnosis Date Noted  . Dizziness 03/04/2017  . Age-related osteoporosis without current pathological fracture 09/30/2016  . Exercise hypoxemia 07/12/2016  . Diastolic CHF, chronic ([place]) 12/25/2015  . GERD (gastroesophageal reflux disease) 12/25/2015  . Epidermal inclusion cyst 09/02/2015  . Type 2 diabetes mellitus with hyperglycemia, with long-term current use of insulin ([place]) 03/10/2015  . Bronchiectasis without acute exacerbation ([place]) 01/07/2015  . PMR (polymyalgia rheumatica) ([place]) 01/07/2015  . Osteoarthritis 01/07/2015  . Asthma, chronic 11/12/2014  . Anxiety state 11/12/2014  . Hyperparathyroidism ([place]) 10/28/2014  . Fibromyalgia 09/21/2014  . Personal history of ovarian cancer 08/23/2014  . Osteoporosis 08/23/2014  . Syncope 08/02/2014  . Hypertension  08/02/2014  . HLD (hyperlipidemia) 08/02/2014  . Hypothyroidism 08/02/2014    Past Surgical History:  Procedure Laterality Date  . ABDOMINAL HYSTERECTOMY  1996  . ANKLE FRACTURE SURGERY     plate and screws  . BLADDER SUSPENSION    . CATARACT EXTRACTION    . EYE SURGERY    . hip surgey    . knee surgey    . LUNG SURGERY    . OOPHORECTOMY     BSO    OB History    Gravida Para Term Preterm AB Living   1 1       1    SAB TAB Ectopic Multiple Live Births                   Home Medications    Prior to Admission medications   Medication Sig Start Date End Date Taking? Authorizing Provider  albuterol (PROVENTIL HFA;VENTOLIN HFA) 108 (90 Base) MCG/ACT inhaler Inhale 2 puffs into the lungs every 6 (six) hours as needed for wheezing or shortness of breath. 03/04/17   [name], MD  albuterol (PROVENTIL) (2.5 MG/3ML) 0.083% nebulizer solution Take 3 mLs (2.5 mg total) by nebulization daily. 10/01/15   Noralee Space, MD  Apoaequorin (PREVAGEN) 10 MG CAPS Take 10 m by mouth daily. 12/01/16   [name], MD  BD PEN NEEDLE NANO U/F 32G X 4 MM MISC USE 4 TIMES A DAY 06/14/16   [name], MD  carvedilol (COREG) 6.25 MG tablet Take 3.125 mg by mouth 2 (two) times daily with a meal.     [provider]  denosumab (PROLIA) 60 MG/ML SOLN injection Inject 60 mg into the skin every 6 (six) months. Administer in upper arm, thigh, or abdomen    [provider]  Dexlansoprazole 30 MG capsule Take 1 capsule (30 mg total) by mouth daily. Patient not taking: Reported on 03/04/2017 07/15/16   [name], MD  Dextromethorphan-Guaifenesin [facility] DM MAXIMUM STRENGTH) 60-1200 MG TB12 Take 1 tablet by mouth 2 (two) times daily as needed (for cough/phlegm). Reported on 06/23/2015    [provider]  donepezil (ARICEPT) 5 MG tablet TAKE 1 TABLET (5 MG TOTAL) BY MOUTH AT BEDTIME. Patient not taking: Reported on 03/04/2017 08/30/16   Nche,  Charlene Brooke, NP  DULoxetine (CYMBALTA) 60 MG capsule Take 60 mg by mouth daily.    [provider]  fluticasone (FLONASE) 50 MCG/ACT nasal spray Place 1-2 sprays into both nostrils daily as needed for allergies or rhinitis. 12/01/16   [name], MD  Fluticasone-Salmeterol (ADVAIR DISKUS) 500-50 MCG/DOSE AEPB Inhale 1 puff into the lungs 2 (two) times daily. 03/04/17   [name], MD  glucose blood (FREESTYLE TEST STRIPS) test strip Use to test blood sugar 3 times daily. Dx: E11.65 10/14/16   Philemon Kingdom, MD  guaiFENesin-codeine (CHERATUSSIN AC) 100-10 MG/5ML syrup Take 5 mLs by mouth 4 (four) times daily as needed for cough. 03/04/17   Noralee Space, MD  HYDROcodone-homatropine [facility]) 5-1.5 MG/5ML syrup Take 5 mLs by mouth every 6 (six) hours as needed for cough. 03/11/17   Noralee Space, MD  Insulin Glargine (LANTUS SOLOSTAR) 100 UNIT/ML Solostar Pen INJECT 20 UNITS SUBCUTANEOUSLY EVERY MORNING 10/14/16   Philemon Kingdom, MD  insulin lispro (HUMALOG KWIKPEN) 100 UNIT/ML KiwkPen Inject 4-6 units into the skin daily. 10/14/16   Philemon Kingdom, MD  Lancets (FREESTYLE) lancets Use to test blood sugar 3 times daily. Dx: E11.65 10/14/16   Philemon Kingdom, MD  LANTUS SOLOSTAR 100 UNIT/ML Solostar Pen INJECT 30 UNITS SUBCUTANEOUSLY EVERY MORNING 03/28/17   Philemon Kingdom, MD  levothyroxine (SYNTHROID, LEVOTHROID) 50 MCG tablet Take 1 tablet (50 mcg total) by mouth daily before breakfast. 08/30/16   Nche, Charlene Brooke, NP  montelukast (SINGULAIR) 10 MG tablet TAKE 1 TABLET (10 MG TOTAL) BY MOUTH DAILY. 04/13/16   Noralee Space, MD  NON FORMULARY 1 Syringe by Epidural route every 3 (three) months. Epidural Steroid Injections for Lumbar Spinal Stenosis up to 4 times a year    [provider]  oxyCODONE-acetaminophen (PERCOCET/ROXICET) 5-325 MG tablet Take 1 tablet by mouth 2 (two) times daily as needed for severe pain. 12/01/16   [name], MD    predniSONE (DELTASONE) 1 MG tablet Take 2 mg by mouth daily with breakfast.     [provider]  pregabalin (LYRICA) 300 MG capsule Take 1 capsule (300 mg total) by mouth 2 (two) times daily. 08/30/16   Nche, Charlene Brooke, NP  ranitidine (ZANTAC) 150 MG tablet Take 150 mg by mouth 2 (two) times daily as needed for heartburn.    [provider]  rosuvastatin (CRESTOR) 10 MG tablet Take 1 tablet (10 mg total) by mouth every evening. 07/15/16   [name], MD  tiotropium (SPIRIVA HANDIHALER) 18 MCG inhalation capsule Place 1 capsule (18 mcg total) into inhaler and inhale daily. 10/23/15   Noralee Space, MD  trimethoprim (TRIMPEX) 100 MG tablet Take 100 mg by mouth daily. 02/23/17   [provider]  zolpidem (AMBIEN) 5 MG tablet Take 1  tablet (5 mg total) by mouth at bedtime. 12/01/16   [name], MD    Family History Family History  Problem Relation Age of Onset  . Lung cancer Mother   . CAD Father   . Diabetes Father   . Lung cancer Maternal Aunt   . Diabetes Paternal Aunt   . Diabetes Paternal Uncle   . Diabetes Paternal Grandmother   . Diabetes Paternal Grandfather     Social History Social History   Tobacco Use  . Smoking status: Never Smoker  . Smokeless tobacco: Never Used  Substance Use Topics  . Alcohol use: No    Alcohol/week: 0.0 oz  . Drug use: No     Allergies   Latex and Penicillins   Review of Systems Review of Systems  Constitutional: Negative for chills, diaphoresis, fatigue and fever.  HENT: Negative for congestion.   Eyes: Negative for visual disturbance.  Respiratory: Positive for chest tightness. Negative for cough, choking, shortness of breath, wheezing and stridor.   Cardiovascular: Positive for chest pain. Negative for palpitations and leg swelling.  Gastrointestinal: Negative for abdominal pain, constipation, diarrhea, nausea and vomiting.  Genitourinary: Negative for dysuria and flank pain.   Musculoskeletal: Negative for back pain and neck stiffness.  Skin: Positive for wound. Negative for rash.  Neurological: Negative for light-headedness and headaches.  All other systems reviewed and are negative.    Physical Exam Updated Vital Signs BP 121/74 (BP Location: Right Arm)   Pulse 86   Temp 98.8 F (37.1 C) (Oral)   Resp 18   Ht 5' (1.524 m)   Wt 63.5 kg (140 lb)   SpO2 100%   BMI 27.34 kg/m   Physical Exam  Constitutional: She is oriented to person, place, and time. She appears well-developed and well-nourished. No distress.  HENT:  Head: Normocephalic.  Mouth/Throat: Oropharynx is clear and moist. No oropharyngeal exudate.  Eyes: Conjunctivae are normal. Pupils are equal, round, and reactive to light.  Neck: Normal range of motion. No JVD present.  Cardiovascular: Normal rate, regular rhythm and intact distal pulses.  No murmur heard. Pulmonary/Chest: Effort normal and breath sounds normal. No stridor. No respiratory distress. She has no wheezes. She exhibits tenderness. She exhibits no laceration, no crepitus, no edema, no deformity and no retraction.    Abdominal: Bowel sounds are normal. There is no tenderness. There is no guarding.  Musculoskeletal: She exhibits tenderness. She exhibits no edema.       Left elbow: She exhibits laceration. She exhibits normal range of motion, no swelling and no effusion. Tenderness found.       Arms: 6cm laceration in a V shape on the elbow. Normal elbow ROM. Deep laceration. Normal Pulse in R wrist. Normal sensation and grip strength. Hemostatic.   Neurological: She is alert and oriented to person, place, and time. No sensory deficit. She exhibits normal muscle tone.  Skin: Capillary refill takes less than 2 seconds. No rash noted. She is not diaphoretic. No erythema.  Psychiatric: She has a normal mood and affect.  Nursing note and vitals reviewed.    ED Treatments / Results  Labs (all labs ordered are listed, but only  abnormal results are displayed) Labs Reviewed - No data to display  EKG  EKG Interpretation None       Radiology Dg Ribs Unilateral W/chest Left  Result Date: 04/07/2017 CLINICAL DATA:  Left rib pain after fall. EXAM: LEFT RIBS AND CHEST - 3+ VIEW COMPARISON:  Chest x-ray dated  November 04, 2015. FINDINGS: No acute fracture or other bone lesions are seen involving the ribs. Old right-sided rib fractures. There is no evidence of pneumothorax or pleural effusion. The lungs remain hyperinflated. Unchanged small pulmonary hernia along the right lower lung. Heart size and mediastinal contours are within normal limits. IMPRESSION: 1. No acute fracture.  No acute cardiopulmonary disease. 2. Stable chronic changes in both lungs, with small pulmonary hernia along the right lower lung. Electronically Signed   By: Titus Dubin M.D.   On: 04/07/2017 13:37   Dg Elbow Complete Left  Result Date: 04/07/2017 CLINICAL DATA:  Left elbow pain after fall. EXAM: LEFT ELBOW - COMPLETE 3+ VIEW COMPARISON:  Left forearm x-rays dated Aug 02, 2014. FINDINGS: There is no evidence of fracture, dislocation, or joint effusion. There is no evidence of arthropathy or other focal bone abnormality. Soft tissues are unremarkable. IMPRESSION: Negative. Electronically Signed   By: Titus Dubin M.D.   On: 04/07/2017 13:39    Procedures .Marland KitchenLaceration Repair Date/Time: 04/08/2017 4:01 AM Performed by: Courtney Paris, MD Authorized by: Courtney Paris, MD   Consent:    Consent obtained:  Verbal   Consent given by:  Patient   Risks discussed:  Infection, pain, poor cosmetic result, poor wound healing, retained foreign body and need for additional repair   Alternatives discussed:  No treatment Anesthesia (see MAR for exact dosages):    Anesthesia method:  Local infiltration   Local anesthetic:  Lidocaine 1% WITH epi Laceration details:    Location:  Shoulder/arm   Shoulder/arm location:  L elbow   Length  (cm):  6   Depth (mm):  1 Repair type:    Repair type:  Complex Pre-procedure details:    Preparation:  Patient was prepped and draped in usual sterile fashion and imaging obtained to evaluate for foreign bodies Exploration:    Hemostasis achieved with:  Direct pressure   Wound exploration: wound explored through full range of motion and entire depth of wound probed and visualized     Wound extent: no muscle damage noted   Treatment:    Area cleansed with:  Saline   Amount of cleaning:  Extensive   Irrigation solution:  Sterile saline   Irrigation volume:  2L   Irrigation method:  Syringe   Debridement:  Minimal Skin repair:    Repair method:  Sutures   Suture size:  4-0   Suture material:  Nylon   Suture technique:  Horizontal mattress   Number of sutures:  3 Approximation:    Approximation:  Loose Post-procedure details:    Dressing:  Non-adherent dressing   Patient tolerance of procedure:  Tolerated well, no immediate complications   (including critical care time)  Medications Ordered in ED Medications  Tdap (BOOSTRIX) injection 0.5 mL (0.5 mLs Intramuscular Given 04/07/17 1553)  lidocaine-EPINEPHrine (XYLOCAINE W/EPI) 2 %-1:100000 (with pres) injection 20 mL (1 mL Intradermal Given 04/07/17 1918)  doxycycline (VIBRA-TABS) tablet 100 mg (100 mg Oral Given 04/07/17 1920)     Initial Impression / Assessment and Plan / ED Course  I have reviewed the triage vital signs and the nursing notes.  Pertinent labs & imaging results that were available during my care of the patient were reviewed by me and considered in my medical decision making (see chart for details).     Kayla Thompson is a 82 y.o. female with a past medical history significant for hypertension, diabetes, GERD, asthma and prior ovarian cancer who presents for fall.  Patient reports that yesterday, she was at home and bent over to clean up some mass that a new puppy in the home made when she had a mechanical fall  and tripped and fell into a coffee table.  She reports she had her left lateral chest and elbow on the coffee table.  She denied hitting her head and denied loss of consciousness.  She denies any neck pain, headache, back pain, anterior chest pain, abdominal pain, or lower extremity pains.  She reports that she sustained a laceration to the left elbow but did not want to bother her family so she cleaned it up, used rubbing alcohol on it, and bandage it up.  She says that her left chest does hurt when she takes a deep breath but otherwise is not painful when she is at rest.  She denies any shortness of breath, nausea, vomiting, vision changes, or other symptoms.  She denied any preceding symptoms.  On exam, patient has a large deep 7 cm laceration to the left elbow.  It is in a angle at the elbow.  Patient has normal sensation, grip strength, and pulses in upper extremities.  Patient's lungs were clear.  Left lateral chest was tender to palpation however no significant lacerations or bruising was seen on the chest wall.  Neck was nontender.  No focal neurologic deficits.  Patient is right-handed.  Patient reports that her fall occurred at 3 PM yesterday, just over 24 hours ago.  Normally, a skin laceration would not be repaired after 24 hours however, as this is a very high tension area over the elbow and it is a deep laceration with a large flap, patient will need some sort of approximation.  Patient will have her tetanus updated.  Wound will be washed out.  Patient will have minimal sutures placed in order to loosely approximate the wound and will likely be started on antibiotics.  Patient will have x-rays of the elbow and chest prior to wound management.  Anticipate discharge with antibiotics with close follow-up with PCP.  X-rays revealed no evidence of acute fracture or dislocation in the ribs or chest.  No pneumothorax or pulmonary contusion seen.  Suspect muscular injury to the chest wall.  Patient  remained on room air and was able to ambulate.  X-ray obtained of the elbow showing no evidence of foreign body, fracture, or dislocation.  No air inside the elbow joint on x-ray.  Given reassuring x-rays, wound was repaired.  As it has been greater than 24 hours, there was very loose approximation performed with horizontal mattress suture and 2 simple interrupted sutures.  Wound was extensively washed with several liters of normal saline.  Decision made to do a partial repair given the large flap on the elbow and a high tension area, I do not feel this would heal and was not approximated without suture placement.  Patient advised that this will likely get infected and to help prevent this, patient was started on antibiotics.  Patient had tetanus updated.  Patient counseled on wound care as well as PCP follow-up in the next several days.  Patient was given incentive spirometer to help prevent pneumonia.  Patient understands return precautions for any signs or symptoms of arm infection patient and family had no other questions or concerns and patient was discharged in good condition.   Final Clinical Impressions(s) / ED Diagnoses   Final diagnoses:  Fall, initial encounter  Laceration of arm, left, initial encounter  Left-sided chest wall pain  ED Discharge Orders        Ordered    doxycycline (VIBRAMYCIN) 100 MG capsule  2 times daily     04/07/17 1853    HYDROcodone-acetaminophen (NORCO/VICODIN) 5-325 MG tablet  Every 4 hours PRN     04/07/17 1853      Clinical Impression: 1. Fall, initial encounter   2. Laceration of arm, left, initial encounter   3. Left-sided chest wall pain     Disposition: Discharge  Condition: Good  I have discussed the results, Dx and Tx plan with the pt(& family if present). He/she/they expressed understanding and agree(s) with the plan. Discharge instructions discussed at great length. Strict return precautions discussed and pt &/or family have  verbalized understanding of the instructions. No further questions at time of discharge.    Discharge Medication List as of 04/07/2017  6:55 PM    START taking these medications   Details  doxycycline (VIBRAMYCIN) 100 MG capsule Take 1 capsule (100 mg total) by mouth 2 (two) times daily for 10 days., Starting Thu 04/07/2017, Until Sun 04/17/2017, Print    HYDROcodone-acetaminophen (NORCO/VICODIN) 5-325 MG tablet Take 1 tablet by mouth every 4 (four) hours as needed., Starting Thu 04/07/2017, Print        Follow Up: [name], MD [place] [id number] [place] [address] [id number] PQ [facility]       Carlas Vandyne, Gwenyth Allegra, MD 04/08/17 [phone]

## 2017-04-07 NOTE — Telephone Encounter (Signed)
Called patient and stated that she had fallen from the "tremors" and wanted to make a visit for today

## 2017-04-07 NOTE — Discharge Instructions (Signed)
Your imaging showed no evidence of rib fracture or severe traumatic injury to the chest wall however I suspect he will have pain for the next few days.  Please use the incentive spirometer as instructed by the respiratory therapy team to prevent development of pneumonias and keep taking deep breaths.  Please use the pain medicine to help with your discomfort.  Your laceration on the elbow was loosely approximated with several sutures.  The wound will continue to drain after it was extensively washed.  Please take the antibiotics to help prevent infection.  Please follow-up with your primary care physician in several days for reassessment and follow-up in approximately 7-10 days for suture removal.  Please try and keep the elbow straight so as not to put tension on the sutures.  Please watch for signs and symptoms of infection.  If any symptoms change or worsen, please return to the nearest emergency department.

## 2017-04-12 ENCOUNTER — Ambulatory Visit: Payer: Self-pay | Admitting: *Deleted

## 2017-04-12 NOTE — Telephone Encounter (Signed)
Patient's daughter phoned to report Mrs. Dizon noticed blood in her soft stool today after taking 4 stool softners yesterday. She denies any other symptoms and already has a hospital follow-up appointment with Dr. Sharlet Salina on 1/24. Gave home care advice at this time as protocol read see PCP within 3 days.  Reason for Disposition . MILD rectal bleeding (more than just a few drops or streaks)  Answer Assessment - Initial Assessment Questions 1. APPEARANCE of BLOOD: "What color is it?" "Is it passed separately, on the surface of the stool, or mixed in with the stool?"      Bright red blood noticed in sani pad and in water with today's BM. 2. AMOUNT: "How much blood was passed?"      Medium amount of blood noticed. 3. FREQUENCY: "How many times has blood been passed with the stools?"      Has had one stool today and it was soft-mushy. 4. ONSET: "When was the blood first seen in the stools?" (Days or weeks)      Blood noticed only today 5. DIARRHEA: "Is there also some diarrhea?" If so, ask: "How many diarrhea stools were passed in past 24 hours?"      She took 4 stool softners yesterday then had several loose stools last night. 6. CONSTIPATION: "Do you have constipation?" If so, "How bad is it?"    Had been on pain medication with cataract surgery so it had been almost a week without a BM. 7. RECURRENT SYMPTOMS: "Have you had blood in your stools before?" If so, ask: "When was the last time?" and "What happened that time?"     A long time ago. 8. BLOOD THINNERS: "Do you take any blood thinners?" (e.g., Coumadin/warfarin, Pradaxa/dabigatran, aspirin)     no 9. OTHER SYMPTOMS: "Do you have any other symptoms?"  (e.g., abdominal pain, vomiting, dizziness, fever)     Only nausea this morning but she has nausea every morning 10. PREGNANCY: "Is there any chance you are pregnant?" "When was your last menstrual period?"    no  Protocols used: RECTAL BLEEDING-A-AH

## 2017-04-14 ENCOUNTER — Encounter: Payer: Self-pay | Admitting: Internal Medicine

## 2017-04-14 ENCOUNTER — Ambulatory Visit (INDEPENDENT_AMBULATORY_CARE_PROVIDER_SITE_OTHER): Payer: Medicare Other | Admitting: Internal Medicine

## 2017-04-14 ENCOUNTER — Telehealth: Payer: Self-pay | Admitting: Internal Medicine

## 2017-04-14 VITALS — BP 128/82 | HR 91 | Temp 98.6°F | Ht 60.0 in | Wt 132.0 lb

## 2017-04-14 DIAGNOSIS — M810 Age-related osteoporosis without current pathological fracture: Secondary | ICD-10-CM

## 2017-04-14 DIAGNOSIS — R531 Weakness: Secondary | ICD-10-CM

## 2017-04-14 DIAGNOSIS — R0781 Pleurodynia: Secondary | ICD-10-CM | POA: Diagnosis not present

## 2017-04-14 DIAGNOSIS — M25522 Pain in left elbow: Secondary | ICD-10-CM | POA: Diagnosis not present

## 2017-04-14 DIAGNOSIS — J479 Bronchiectasis, uncomplicated: Secondary | ICD-10-CM | POA: Diagnosis not present

## 2017-04-14 MED ORDER — DICLOFENAC SODIUM 1 % TD GEL: 2.0000 g | Freq: Four times a day (QID) | TRANSDERMAL | 3 refills | Status: DC

## 2017-04-14 NOTE — Telephone Encounter (Signed)
I'll start this tomorrow

## 2017-04-14 NOTE — Patient Instructions (Signed)
We have sent in the refill of hydrocodone. You can take 1 pill every 4 hours as needed. As the pain decreases in the ribs you will not need this as often.   We have also sent in voltaren gel to rub on the area where it hurts. Try this before the hydrocodone to see if it works as it is safer.   Do not take any more antibiotics as they are not needed.   We have removed the sutures so just keep the wound covered and you can change the bandage every 2-3 days. It is okay to gently pat the wound clean if needed.   We will get home health out to the house to help while you are recovering.

## 2017-04-14 NOTE — Telephone Encounter (Signed)
Spoke to pharmacy regarding the patient's diclofenac sodium (VOLTAREN) 1 % GEL. This is needing a prior authorization.

## 2017-04-14 NOTE — Progress Notes (Signed)
   Subjective:    Patient ID: Kayla Thompson, female    DOB: 06-05-1935, 82 y.o.   MRN: [id number]  HPI The patient is a 82 YO female coming in for ER follow up (in for fall after mechanical fall, cut elbow on the table but denies head injury or LOC, given several stitches and doxycycline for ppx of infection). She is taking the doxycycline but it is making her very nauseous and she is wondering if we can switch it. She is taking it with food. She denies further fall or injury. She had taken some of her codeine cough syrup prior to fall and her daughter made her get rid of it. She is taking hydrocodone for the pain still and is about the run out. She does not have other pain medications at home.Tried plain tylenol and this did not help at all. She is wanting to get the wound checked for infection today.  Review of Systems  Constitutional: Negative.   HENT: Negative.   Eyes: Negative.   Respiratory: Positive for cough. Negative for chest tightness and shortness of breath.   Cardiovascular: Positive for chest pain. Negative for palpitations and leg swelling.  Gastrointestinal: Negative for abdominal distention, abdominal pain, constipation, diarrhea, nausea and vomiting.  Musculoskeletal: Positive for myalgias.  Skin: Positive for wound.  Neurological: Negative.   Psychiatric/Behavioral: Negative.       Objective:   Physical Exam  Constitutional: She is oriented to person, place, and time. She appears well-developed and well-nourished.  HENT:  Head: Normocephalic and atraumatic.  Eyes: EOM are normal.  Neck: Normal range of motion.  Cardiovascular: Normal rate and regular rhythm.  Pulmonary/Chest: Effort normal and breath sounds normal. No respiratory distress. She has no wheezes. She has no rales.  Abdominal: Soft. Bowel sounds are normal. She exhibits no distension. There is no tenderness. There is no rebound.  Musculoskeletal: She exhibits no edema.  Neurological: She is alert and  oriented to person, place, and time. Coordination normal.  Skin: Skin is warm and dry.  V shaped skin tear on the left elbow with no signs of infection. Good granulation tissue present, no abscess or fluctuance.   Psychiatric: She has a normal mood and affect.   Vitals:   04/14/17 1351  BP: 128/82  Pulse: 91  Temp: 98.6 F (37 C)  TempSrc: Oral  SpO2: 98%  Weight: 132 lb (59.9 kg)  Height: 5' (1.524 m)   Suture removal done in the office with removal of 3 sutures (per ER notes this is 100% removal). Patient tolerated the procedure well with 0 EBL and no topical anaesthetic required    Assessment & Plan:

## 2017-04-15 ENCOUNTER — Other Ambulatory Visit: Payer: Self-pay

## 2017-04-15 MED ORDER — HYDROCODONE-ACETAMINOPHEN 5-325 MG PO TABS: 1.0000 | ORAL_TABLET | ORAL | 0 refills | Status: DC | PRN

## 2017-04-15 MED ORDER — ZOLPIDEM TARTRATE 5 MG PO TABS: 5.0000 mg | ORAL_TABLET | Freq: Every day | ORAL | 2 refills | Status: DC

## 2017-04-15 MED ORDER — DICLOFENAC SODIUM 1 % TD GEL: 2.0000 g | Freq: Four times a day (QID) | TRANSDERMAL | 3 refills | Status: DC

## 2017-04-15 NOTE — Telephone Encounter (Signed)
Contacted patient to inform them that no insurance covers this medication but that I can send it to [facility] out of [place] and they mail the medication to her for anywhere between 0-10 dollars. Patient agreed and medication sent there.

## 2017-04-16 DIAGNOSIS — R0781 Pleurodynia: Secondary | ICD-10-CM | POA: Insufficient documentation

## 2017-04-16 DIAGNOSIS — M25522 Pain in left elbow: Secondary | ICD-10-CM | POA: Insufficient documentation

## 2017-04-16 NOTE — Assessment & Plan Note (Signed)
Sutures removed as wound is granulated and healing well, no signs of infection. Wound was not dirty and mechanism of cut was clean so it is unclear why she was given 10 days of antibiotics. As no signs of infection will just stop doxycycline and not replace. She has done about 1 week of antibiotics currently.

## 2017-04-16 NOTE — Assessment & Plan Note (Signed)
Since her codeine/hydrocodone cough syrup was to blame for her unsteadiness have removed from her list and would recommend not to prescribe any codeine or hydrocodone cough syrups in the future. Given her osteoporosis she is high risk for fracture with fall.

## 2017-04-16 NOTE — Assessment & Plan Note (Signed)
Doing prolia currently for treatment. No fracture with recent fall.

## 2017-04-16 NOTE — Assessment & Plan Note (Signed)
Rx for 5 day supply of hydrocodone and [place] narcotic database reviewed and appropriate prior to rx. She is advised not to drive after taking and discussed precautions for return. Should try heat prior to using,. Rx for voltaren gel which she is also supposed to try prior to any pain medication. Rx for home health for stability wound care at home.

## 2017-04-18 ENCOUNTER — Ambulatory Visit: Payer: Medicare Other | Admitting: Internal Medicine

## 2017-04-19 ENCOUNTER — Telehealth: Payer: Self-pay

## 2017-04-19 NOTE — Telephone Encounter (Signed)
Left message advising patient that her next prolia injection is due, she can schedule at her earliest [facility] [place] has been verified for 2019 and she has estimated copay is $0---can schedule nurse visit to get injection---can talk with Teyah Rossy,RN at [place] office if any further questions

## 2017-04-21 DIAGNOSIS — I5032 Chronic diastolic (congestive) heart failure: Secondary | ICD-10-CM | POA: Diagnosis not present

## 2017-04-21 DIAGNOSIS — M810 Age-related osteoporosis without current pathological fracture: Secondary | ICD-10-CM | POA: Diagnosis not present

## 2017-04-21 DIAGNOSIS — I110 Hypertensive heart disease with heart failure: Secondary | ICD-10-CM | POA: Diagnosis not present

## 2017-04-21 DIAGNOSIS — M797 Fibromyalgia: Secondary | ICD-10-CM | POA: Diagnosis not present

## 2017-04-21 DIAGNOSIS — J479 Bronchiectasis, uncomplicated: Secondary | ICD-10-CM | POA: Diagnosis not present

## 2017-04-21 DIAGNOSIS — J45909 Unspecified asthma, uncomplicated: Secondary | ICD-10-CM | POA: Diagnosis not present

## 2017-04-21 DIAGNOSIS — Z794 Long term (current) use of insulin: Secondary | ICD-10-CM | POA: Diagnosis not present

## 2017-04-21 DIAGNOSIS — Z7951 Long term (current) use of inhaled steroids: Secondary | ICD-10-CM | POA: Diagnosis not present

## 2017-04-21 DIAGNOSIS — S51812D Laceration without foreign body of left forearm, subsequent encounter: Secondary | ICD-10-CM | POA: Diagnosis not present

## 2017-04-21 DIAGNOSIS — Z9181 History of falling: Secondary | ICD-10-CM | POA: Diagnosis not present

## 2017-04-21 DIAGNOSIS — M353 Polymyalgia rheumatica: Secondary | ICD-10-CM | POA: Diagnosis not present

## 2017-04-21 DIAGNOSIS — M25522 Pain in left elbow: Secondary | ICD-10-CM | POA: Diagnosis not present

## 2017-04-21 DIAGNOSIS — Z7952 Long term (current) use of systemic steroids: Secondary | ICD-10-CM | POA: Diagnosis not present

## 2017-04-21 DIAGNOSIS — E1165 Type 2 diabetes mellitus with hyperglycemia: Secondary | ICD-10-CM | POA: Diagnosis not present

## 2017-04-22 DIAGNOSIS — I110 Hypertensive heart disease with heart failure: Secondary | ICD-10-CM | POA: Diagnosis not present

## 2017-04-22 DIAGNOSIS — S51812D Laceration without foreign body of left forearm, subsequent encounter: Secondary | ICD-10-CM | POA: Diagnosis not present

## 2017-04-22 DIAGNOSIS — I5032 Chronic diastolic (congestive) heart failure: Secondary | ICD-10-CM | POA: Diagnosis not present

## 2017-04-22 DIAGNOSIS — E1165 Type 2 diabetes mellitus with hyperglycemia: Secondary | ICD-10-CM | POA: Diagnosis not present

## 2017-04-22 DIAGNOSIS — M353 Polymyalgia rheumatica: Secondary | ICD-10-CM | POA: Diagnosis not present

## 2017-04-22 DIAGNOSIS — M797 Fibromyalgia: Secondary | ICD-10-CM | POA: Diagnosis not present

## 2017-04-23 DIAGNOSIS — M353 Polymyalgia rheumatica: Secondary | ICD-10-CM | POA: Diagnosis not present

## 2017-04-23 DIAGNOSIS — E1165 Type 2 diabetes mellitus with hyperglycemia: Secondary | ICD-10-CM | POA: Diagnosis not present

## 2017-04-23 DIAGNOSIS — I110 Hypertensive heart disease with heart failure: Secondary | ICD-10-CM | POA: Diagnosis not present

## 2017-04-23 DIAGNOSIS — I5032 Chronic diastolic (congestive) heart failure: Secondary | ICD-10-CM | POA: Diagnosis not present

## 2017-04-23 DIAGNOSIS — S51812D Laceration without foreign body of left forearm, subsequent encounter: Secondary | ICD-10-CM | POA: Diagnosis not present

## 2017-04-23 DIAGNOSIS — M797 Fibromyalgia: Secondary | ICD-10-CM | POA: Diagnosis not present

## 2017-04-25 ENCOUNTER — Telehealth: Payer: Self-pay | Admitting: Internal Medicine

## 2017-04-25 ENCOUNTER — Ambulatory Visit: Payer: Medicare Other | Admitting: Internal Medicine

## 2017-04-25 DIAGNOSIS — S51812D Laceration without foreign body of left forearm, subsequent encounter: Secondary | ICD-10-CM | POA: Diagnosis not present

## 2017-04-25 DIAGNOSIS — I110 Hypertensive heart disease with heart failure: Secondary | ICD-10-CM | POA: Diagnosis not present

## 2017-04-25 DIAGNOSIS — I5032 Chronic diastolic (congestive) heart failure: Secondary | ICD-10-CM | POA: Diagnosis not present

## 2017-04-25 DIAGNOSIS — M797 Fibromyalgia: Secondary | ICD-10-CM | POA: Diagnosis not present

## 2017-04-25 DIAGNOSIS — E1165 Type 2 diabetes mellitus with hyperglycemia: Secondary | ICD-10-CM | POA: Diagnosis not present

## 2017-04-25 DIAGNOSIS — M353 Polymyalgia rheumatica: Secondary | ICD-10-CM | POA: Diagnosis not present

## 2017-04-25 NOTE — Telephone Encounter (Signed)
Copied from [place]. Topic: General - Other >> Apr 25, 2017  9:44 AM Lolita Rieger, RMA wrote: Reason for CRM: Liji from [facility] home health called needing verbal for pt for 2 times for 6 weeks  please call [phone]

## 2017-04-26 ENCOUNTER — Encounter: Payer: Self-pay | Admitting: Internal Medicine

## 2017-04-26 ENCOUNTER — Ambulatory Visit (INDEPENDENT_AMBULATORY_CARE_PROVIDER_SITE_OTHER): Payer: Medicare Other | Admitting: Internal Medicine

## 2017-04-26 ENCOUNTER — Telehealth: Payer: Self-pay | Admitting: Internal Medicine

## 2017-04-26 VITALS — BP 110/70 | HR 82 | Temp 97.6°F | Ht 60.0 in | Wt 133.0 lb

## 2017-04-26 DIAGNOSIS — M25522 Pain in left elbow: Secondary | ICD-10-CM | POA: Diagnosis not present

## 2017-04-26 DIAGNOSIS — R0781 Pleurodynia: Secondary | ICD-10-CM

## 2017-04-26 DIAGNOSIS — I110 Hypertensive heart disease with heart failure: Secondary | ICD-10-CM | POA: Diagnosis not present

## 2017-04-26 DIAGNOSIS — S51812D Laceration without foreign body of left forearm, subsequent encounter: Secondary | ICD-10-CM | POA: Diagnosis not present

## 2017-04-26 DIAGNOSIS — M810 Age-related osteoporosis without current pathological fracture: Secondary | ICD-10-CM | POA: Diagnosis not present

## 2017-04-26 DIAGNOSIS — I5032 Chronic diastolic (congestive) heart failure: Secondary | ICD-10-CM | POA: Diagnosis not present

## 2017-04-26 DIAGNOSIS — M797 Fibromyalgia: Secondary | ICD-10-CM | POA: Diagnosis not present

## 2017-04-26 DIAGNOSIS — M353 Polymyalgia rheumatica: Secondary | ICD-10-CM | POA: Diagnosis not present

## 2017-04-26 DIAGNOSIS — E1165 Type 2 diabetes mellitus with hyperglycemia: Secondary | ICD-10-CM | POA: Diagnosis not present

## 2017-04-26 MED ORDER — MIRABEGRON ER 50 MG PO TB24: 50.0000 mg | ORAL_TABLET | Freq: Every day | ORAL | 3 refills | Status: DC

## 2017-04-26 MED ORDER — HYDROCODONE-ACETAMINOPHEN 5-325 MG PO TABS: 1.0000 | ORAL_TABLET | ORAL | 0 refills | Status: DC | PRN

## 2017-04-26 MED ORDER — DENOSUMAB 60 MG/ML ~~LOC~~ SOLN
60.0000 mg | Freq: Once | SUBCUTANEOUS | Status: AC
  Administered 2017-04-26: 60 mg via SUBCUTANEOUS

## 2017-04-26 MED ORDER — PREGABALIN 100 MG PO CAPS: 100.0000 mg | ORAL_CAPSULE | Freq: Two times a day (BID) | ORAL | 2 refills | Status: DC

## 2017-04-26 MED ORDER — MIRABEGRON ER 50 MG PO TB24
50.0000 mg | ORAL_TABLET | Freq: Every day | ORAL | 3 refills | Status: DC
Start: 2017-04-26 — End: 2017-04-26

## 2017-04-26 NOTE — Telephone Encounter (Signed)
Copied from [place] [id number]. Topic: Quick Communication - See Telephone Encounter >> Apr 26, 2017  9:00 AM Cleaster Corin, NT wrote: CRM for notification. See Telephone encounter for:   04/26/17. Sharyn Lull from [place] home health calling to get verbal orders for pt. For home OT 1 week 1 then place on hold for 2 weeks per pt. Request then OT 2 week 2 and 1 week1. Sharyn Lull can be reached at [phone].

## 2017-04-26 NOTE — Telephone Encounter (Signed)
Fine

## 2017-04-26 NOTE — Patient Instructions (Signed)
We have given you the prolia today.  We have sent in the hydrocodone and the lyrica.   We have sent in the myrbetriq to take 1 pill daily for the bladder. Give it 1-2 months to see if it works.

## 2017-04-26 NOTE — Telephone Encounter (Signed)
Verbals given  

## 2017-04-26 NOTE — Progress Notes (Signed)
   Subjective:    Patient ID: Kayla Thompson, female    DOB: 20-Apr-1935, 83 y.o.   MRN: [id number]  HPI The patient is a 82 YO female coming in for several concerns including follow up on her suture removal of skin tear on her elbow (home health is changing bandages about every 1-2 days, she is not having as much pain from the area currently, no drainage or fevers or rash), and rib pain on the left (still present but not as severe as prior, still has some hydrocodone but not much left, has tried otc medications and they have not been helpful), and her fibromyalgia (she is now off lyrica as she felt it was causing shaking/tremors, previously was on maximum dosing, she does not have a lot of good options for the pain, is not active right now due to the injury and fall in the last month or so, wants to try getting back on lyrica lower dose if that is possible).   Review of Systems  Constitutional: Positive for activity change. Negative for appetite change, chills, fatigue, fever and unexpected weight change.  HENT: Negative.   Eyes: Negative.   Respiratory: Negative.   Cardiovascular: Negative.   Gastrointestinal: Negative.   Musculoskeletal: Positive for arthralgias, back pain, myalgias and neck pain. Negative for gait problem and joint swelling.  Skin: Positive for wound.  Neurological: Negative.   Psychiatric/Behavioral: Negative.       Objective:   Physical Exam  Constitutional: She is oriented to person, place, and time. She appears well-developed and well-nourished.  HENT:  Head: Normocephalic and atraumatic.  Eyes: EOM are normal.  Neck: Normal range of motion.  Cardiovascular: Normal rate and regular rhythm.  Pulmonary/Chest: Effort normal and breath sounds normal. No respiratory distress. She has no wheezes. She has no rales.  Abdominal: Soft. Bowel sounds are normal. She exhibits no distension. There is no tenderness. There is no rebound.  Musculoskeletal: She exhibits no edema.    Neurological: She is alert and oriented to person, place, and time. Coordination normal.  Skin: Skin is warm and dry.  Skin tear on the left elbow region covered in granulation tissue without signs of infection and decreasing in size since suture removal about 1-2 weeks ago.   Psychiatric: She has a normal mood and affect.   Vitals:   04/26/17 1328  BP: 110/70  Pulse: 82  Temp: 97.6 F (36.4 C)  TempSrc: Oral  SpO2: 98%  Weight: 133 lb (60.3 kg)  Height: 5' (1.524 m)      Assessment & Plan:  prolia given at visit

## 2017-04-27 DIAGNOSIS — E1165 Type 2 diabetes mellitus with hyperglycemia: Secondary | ICD-10-CM | POA: Diagnosis not present

## 2017-04-27 DIAGNOSIS — M797 Fibromyalgia: Secondary | ICD-10-CM | POA: Diagnosis not present

## 2017-04-27 DIAGNOSIS — I110 Hypertensive heart disease with heart failure: Secondary | ICD-10-CM | POA: Diagnosis not present

## 2017-04-27 DIAGNOSIS — I5032 Chronic diastolic (congestive) heart failure: Secondary | ICD-10-CM | POA: Diagnosis not present

## 2017-04-27 DIAGNOSIS — M353 Polymyalgia rheumatica: Secondary | ICD-10-CM | POA: Diagnosis not present

## 2017-04-27 DIAGNOSIS — S51812D Laceration without foreign body of left forearm, subsequent encounter: Secondary | ICD-10-CM | POA: Diagnosis not present

## 2017-04-29 ENCOUNTER — Other Ambulatory Visit: Payer: Self-pay | Admitting: Internal Medicine

## 2017-04-29 ENCOUNTER — Encounter: Payer: Self-pay | Admitting: Internal Medicine

## 2017-04-29 DIAGNOSIS — S51812D Laceration without foreign body of left forearm, subsequent encounter: Secondary | ICD-10-CM | POA: Diagnosis not present

## 2017-04-29 DIAGNOSIS — M353 Polymyalgia rheumatica: Secondary | ICD-10-CM | POA: Diagnosis not present

## 2017-04-29 DIAGNOSIS — E1165 Type 2 diabetes mellitus with hyperglycemia: Secondary | ICD-10-CM | POA: Diagnosis not present

## 2017-04-29 DIAGNOSIS — I110 Hypertensive heart disease with heart failure: Secondary | ICD-10-CM | POA: Diagnosis not present

## 2017-04-29 DIAGNOSIS — M797 Fibromyalgia: Secondary | ICD-10-CM | POA: Diagnosis not present

## 2017-04-29 DIAGNOSIS — I5032 Chronic diastolic (congestive) heart failure: Secondary | ICD-10-CM | POA: Diagnosis not present

## 2017-04-29 NOTE — Assessment & Plan Note (Signed)
Rx for 5 day supply hydrocodone for pain and she should be healing fairly soon and will need to transition to tramadol or otc at next visit.

## 2017-04-29 NOTE — Assessment & Plan Note (Signed)
Wound is healing nicely without signs of infection. Good granulation tissue and mild clear drainage.

## 2017-04-29 NOTE — Assessment & Plan Note (Signed)
Add back lyrica 100 mg BID and adjust if needed in about 1 month. She is also taking cymbalta which does help her some.

## 2017-04-29 NOTE — Assessment & Plan Note (Signed)
Prolia due and given at visit. She will need to continue. Likely related to her chronic prednisone usage.

## 2017-04-29 NOTE — Telephone Encounter (Signed)
Copied from [place]. Topic: Inquiry >> Apr 29, 2017  4:41 PM Neva Seat wrote: [place] [phone]  Pt couldn't have her evaluation this week.  Needing Verbal Orders:  Evaluation LCSW to next week

## 2017-05-02 ENCOUNTER — Ambulatory Visit: Payer: Self-pay | Admitting: *Deleted

## 2017-05-02 DIAGNOSIS — M353 Polymyalgia rheumatica: Secondary | ICD-10-CM | POA: Diagnosis not present

## 2017-05-02 DIAGNOSIS — I110 Hypertensive heart disease with heart failure: Secondary | ICD-10-CM | POA: Diagnosis not present

## 2017-05-02 DIAGNOSIS — M797 Fibromyalgia: Secondary | ICD-10-CM | POA: Diagnosis not present

## 2017-05-02 DIAGNOSIS — I5032 Chronic diastolic (congestive) heart failure: Secondary | ICD-10-CM | POA: Diagnosis not present

## 2017-05-02 DIAGNOSIS — E1165 Type 2 diabetes mellitus with hyperglycemia: Secondary | ICD-10-CM | POA: Diagnosis not present

## 2017-05-02 DIAGNOSIS — S51812D Laceration without foreign body of left forearm, subsequent encounter: Secondary | ICD-10-CM | POA: Diagnosis not present

## 2017-05-02 NOTE — Telephone Encounter (Signed)
Eleanor phoned to report Mrs. 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 She is asking if she could pack the pocket in the wound with aquacell packing or Ca alginate dressing, as there has been no change in the would over the last 3 visits of dressing it with Neosporin and a dry dressing cover.  Please advise [place] RN, Langley Gauss. [phone]

## 2017-05-02 NOTE — Telephone Encounter (Signed)
Fine

## 2017-05-02 NOTE — Telephone Encounter (Signed)
Please do not pack wound. She was seen in the office last week and it is healing appropriately. Can use aquacell on the wound or liberal vaseline with gauze.

## 2017-05-02 NOTE — Telephone Encounter (Signed)
Kayla Thompson was informed, states that she did not see any reason for aquacell. So will continue with previous plan from patients office visit

## 2017-05-02 NOTE — Telephone Encounter (Signed)
Verbals given  

## 2017-05-03 DIAGNOSIS — E1165 Type 2 diabetes mellitus with hyperglycemia: Secondary | ICD-10-CM | POA: Diagnosis not present

## 2017-05-03 DIAGNOSIS — M353 Polymyalgia rheumatica: Secondary | ICD-10-CM | POA: Diagnosis not present

## 2017-05-03 DIAGNOSIS — I5032 Chronic diastolic (congestive) heart failure: Secondary | ICD-10-CM | POA: Diagnosis not present

## 2017-05-03 DIAGNOSIS — I110 Hypertensive heart disease with heart failure: Secondary | ICD-10-CM | POA: Diagnosis not present

## 2017-05-03 DIAGNOSIS — S51812D Laceration without foreign body of left forearm, subsequent encounter: Secondary | ICD-10-CM | POA: Diagnosis not present

## 2017-05-03 DIAGNOSIS — M797 Fibromyalgia: Secondary | ICD-10-CM | POA: Diagnosis not present

## 2017-05-04 DIAGNOSIS — M353 Polymyalgia rheumatica: Secondary | ICD-10-CM | POA: Diagnosis not present

## 2017-05-04 DIAGNOSIS — I110 Hypertensive heart disease with heart failure: Secondary | ICD-10-CM | POA: Diagnosis not present

## 2017-05-04 DIAGNOSIS — S51812D Laceration without foreign body of left forearm, subsequent encounter: Secondary | ICD-10-CM | POA: Diagnosis not present

## 2017-05-04 DIAGNOSIS — E1165 Type 2 diabetes mellitus with hyperglycemia: Secondary | ICD-10-CM | POA: Diagnosis not present

## 2017-05-04 DIAGNOSIS — M797 Fibromyalgia: Secondary | ICD-10-CM | POA: Diagnosis not present

## 2017-05-04 DIAGNOSIS — I5032 Chronic diastolic (congestive) heart failure: Secondary | ICD-10-CM | POA: Diagnosis not present

## 2017-05-05 ENCOUNTER — Telehealth: Payer: Self-pay | Admitting: Internal Medicine

## 2017-05-05 NOTE — Telephone Encounter (Signed)
Should not be refilled. Is her pain improving at all?

## 2017-05-05 NOTE — Telephone Encounter (Signed)
Rx refill request for provider review:  Hydrocodone-acetaminophen 5-325 mg  LOV:04/26/17 Last filled - 04/26/17  Pharmacy: [facility]

## 2017-05-05 NOTE — Telephone Encounter (Signed)
Copied from [place] [phone]. Topic: Quick Communication - Rx Refill/Question >> May 05, 2017  2:21 PM Bea Graff, NT wrote: Medication:  HYDROcodone-acetaminophen (NORCO/VICODIN)    Has the patient contacted their pharmacy? Yes.     (Agent: If no, request that the patient contact the pharmacy for the refill.)   Preferred Pharmacy (with phone number or street name): [facility]  [address]   Agent: Please be advised that RX refills may take up to 3 business days. We ask that you follow-up with your pharmacy.

## 2017-05-06 MED ORDER — TRAMADOL HCL 50 MG PO TABS: 50.0000 mg | ORAL_TABLET | Freq: Three times a day (TID) | ORAL | 0 refills | Status: DC | PRN

## 2017-05-06 NOTE — Telephone Encounter (Signed)
We have sent in tramadol to help with her pain. She can take 1 pill up to 3 times per day.

## 2017-05-06 NOTE — Telephone Encounter (Signed)
Notified pt w/MD response. Pt states no her pain is still there. She didn't have anything to take all day yesterday, and when she went to bed the pain was very severe. She states she is needing something to relieve the pain.Marland KitchenJohny Chess

## 2017-05-06 NOTE — Telephone Encounter (Signed)
Called pt no answer LMOM w/MD response../lmb 

## 2017-05-06 NOTE — Telephone Encounter (Signed)
E-prescribe is not working so printed and signed for tramadol to help with her pain.

## 2017-05-09 ENCOUNTER — Telehealth: Payer: Self-pay | Admitting: Internal Medicine

## 2017-05-09 DIAGNOSIS — M797 Fibromyalgia: Secondary | ICD-10-CM | POA: Diagnosis not present

## 2017-05-09 DIAGNOSIS — M353 Polymyalgia rheumatica: Secondary | ICD-10-CM | POA: Diagnosis not present

## 2017-05-09 DIAGNOSIS — I5032 Chronic diastolic (congestive) heart failure: Secondary | ICD-10-CM | POA: Diagnosis not present

## 2017-05-09 DIAGNOSIS — S51812D Laceration without foreign body of left forearm, subsequent encounter: Secondary | ICD-10-CM | POA: Diagnosis not present

## 2017-05-09 DIAGNOSIS — I110 Hypertensive heart disease with heart failure: Secondary | ICD-10-CM | POA: Diagnosis not present

## 2017-05-09 DIAGNOSIS — E1165 Type 2 diabetes mellitus with hyperglycemia: Secondary | ICD-10-CM | POA: Diagnosis not present

## 2017-05-09 NOTE — Telephone Encounter (Signed)
Copied from [place] [phone]. Topic: Quick Communication - See Telephone Encounter >> May 09, 2017 12:33 PM Vernona Rieger wrote: CRM for notification. See Telephone encounter for:   05/09/17.  Geni Bers from [employer] ([employer] officer, [employer]) needs verbal orders for 1 time a week for one week to follow up on community resources.  Call back is [phone]

## 2017-05-09 NOTE — Telephone Encounter (Signed)
Called jacqueline back no answer LMOM w/MD response.Marland KitchenJohny Chess

## 2017-05-09 NOTE — Telephone Encounter (Signed)
Follow

## 2017-05-10 DIAGNOSIS — I5032 Chronic diastolic (congestive) heart failure: Secondary | ICD-10-CM | POA: Diagnosis not present

## 2017-05-10 DIAGNOSIS — E1165 Type 2 diabetes mellitus with hyperglycemia: Secondary | ICD-10-CM | POA: Diagnosis not present

## 2017-05-10 DIAGNOSIS — S51812D Laceration without foreign body of left forearm, subsequent encounter: Secondary | ICD-10-CM | POA: Diagnosis not present

## 2017-05-10 DIAGNOSIS — M353 Polymyalgia rheumatica: Secondary | ICD-10-CM | POA: Diagnosis not present

## 2017-05-10 DIAGNOSIS — M797 Fibromyalgia: Secondary | ICD-10-CM | POA: Diagnosis not present

## 2017-05-10 DIAGNOSIS — I110 Hypertensive heart disease with heart failure: Secondary | ICD-10-CM | POA: Diagnosis not present

## 2017-05-11 ENCOUNTER — Encounter: Payer: Self-pay | Admitting: Internal Medicine

## 2017-05-11 ENCOUNTER — Telehealth: Payer: Self-pay | Admitting: Internal Medicine

## 2017-05-11 ENCOUNTER — Ambulatory Visit (INDEPENDENT_AMBULATORY_CARE_PROVIDER_SITE_OTHER): Payer: Medicare Other | Admitting: Internal Medicine

## 2017-05-11 DIAGNOSIS — M25522 Pain in left elbow: Secondary | ICD-10-CM

## 2017-05-11 DIAGNOSIS — M797 Fibromyalgia: Secondary | ICD-10-CM

## 2017-05-11 NOTE — Telephone Encounter (Signed)
Patient stated she need dates for insurance co. for the last time she was seen by the MD  Called [facility]

## 2017-05-11 NOTE — Assessment & Plan Note (Signed)
Kayla Thompson is improving and now she is using tylenol for pain mostly. Rx for tramadol at home which she can use for severe pain. She is breathing deeper on exam and less tenderness along the left flank on exam today. Advised her to call her orthopedic for her left shoulder and left knee pain.

## 2017-05-11 NOTE — Progress Notes (Signed)
   Subjective:    Patient ID: Kayla Thompson, female    DOB: 04/30/1935, 82 y.o.   MRN: [id number]  HPI The patient is a 82 YO female coming in for wound check of left elbow. She is having more pain in that area and some itching. She has been having nurses come out and change bandages about 2-3 times per week. She denies drainage or fevers. No chills. She denies further injury. She is still having pain in her shoulder and knee (both left). She has not contacted her orthopedic (dr Rip Harbour) yet but has severe arthritis in those joints. She still takes prednisone daily for her RA.   Review of Systems  Constitutional: Negative.   HENT: Negative.   Eyes: Negative.   Respiratory: Negative for cough, chest tightness and shortness of breath.   Cardiovascular: Negative for chest pain, palpitations and leg swelling.  Gastrointestinal: Negative for abdominal distention, abdominal pain, constipation, diarrhea, nausea and vomiting.  Musculoskeletal: Positive for arthralgias, gait problem and myalgias. Negative for joint swelling.  Skin: Positive for wound.  Psychiatric/Behavioral: Negative.       Objective:   Physical Exam  Constitutional: She is oriented to person, place, and time. She appears well-developed and well-nourished.  HENT:  Head: Normocephalic and atraumatic.  Eyes: EOM are normal.  Neck: Normal range of motion.  Cardiovascular: Normal rate and regular rhythm.  Pulmonary/Chest: Effort normal and breath sounds normal. No respiratory distress. She has no wheezes. She has no rales.  Abdominal: Soft. Bowel sounds are normal. She exhibits no distension. There is no tenderness. There is no rebound.  Musculoskeletal: She exhibits tenderness. She exhibits no edema.  Pain in the left shoulder and neck region, pain in the left knee and some more swollen than usual  Neurological: She is alert and oriented to person, place, and time. Coordination abnormal.  Walker for ambulation  Skin: Skin is  warm and dry.  Wound on the left elbow some smaller than prior, still about 1-2 mm open wound without drainage and no abscess, no redness around the wound and no cellulitis   Vitals:   05/11/17 0821  BP: 130/70  Pulse: 85  Temp: 97.7 F (36.5 C)  TempSrc: Oral  SpO2: 97%  Weight: 132 lb (59.9 kg)  Height: 5' (1.524 m)      Assessment & Plan:

## 2017-05-11 NOTE — Patient Instructions (Signed)
We have bandaged you up today and it is okay if you don't at home. The bandage is just for protection and you are able to bath and shower and travel with it.   If the wound gets dirty use gentle soapy water to clean.

## 2017-05-11 NOTE — Assessment & Plan Note (Signed)
Wound continues to heal, no signs of infection. No indication for antibiotics today. Using tylenol for pain only which is an improvement. Continue with bandaging for protection and allowing to heal. Neosporin as needed.

## 2017-05-11 NOTE — Telephone Encounter (Signed)
Called pt and gave her the dates of her last OV and her first OV as requested

## 2017-05-12 DIAGNOSIS — I5032 Chronic diastolic (congestive) heart failure: Secondary | ICD-10-CM | POA: Diagnosis not present

## 2017-05-12 DIAGNOSIS — M25562 Pain in left knee: Secondary | ICD-10-CM | POA: Diagnosis not present

## 2017-05-12 DIAGNOSIS — M797 Fibromyalgia: Secondary | ICD-10-CM | POA: Diagnosis not present

## 2017-05-12 DIAGNOSIS — M25512 Pain in left shoulder: Secondary | ICD-10-CM | POA: Diagnosis not present

## 2017-05-12 DIAGNOSIS — S51812D Laceration without foreign body of left forearm, subsequent encounter: Secondary | ICD-10-CM | POA: Diagnosis not present

## 2017-05-12 DIAGNOSIS — E1165 Type 2 diabetes mellitus with hyperglycemia: Secondary | ICD-10-CM | POA: Diagnosis not present

## 2017-05-12 DIAGNOSIS — I110 Hypertensive heart disease with heart failure: Secondary | ICD-10-CM | POA: Diagnosis not present

## 2017-05-12 DIAGNOSIS — M353 Polymyalgia rheumatica: Secondary | ICD-10-CM | POA: Diagnosis not present

## 2017-05-13 ENCOUNTER — Ambulatory Visit: Payer: Medicare Other | Admitting: Internal Medicine

## 2017-05-16 ENCOUNTER — Telehealth: Payer: Self-pay | Admitting: Internal Medicine

## 2017-05-16 ENCOUNTER — Telehealth: Payer: Self-pay

## 2017-05-16 DIAGNOSIS — M353 Polymyalgia rheumatica: Secondary | ICD-10-CM | POA: Diagnosis not present

## 2017-05-16 DIAGNOSIS — I5032 Chronic diastolic (congestive) heart failure: Secondary | ICD-10-CM | POA: Diagnosis not present

## 2017-05-16 DIAGNOSIS — E1165 Type 2 diabetes mellitus with hyperglycemia: Secondary | ICD-10-CM | POA: Diagnosis not present

## 2017-05-16 DIAGNOSIS — M797 Fibromyalgia: Secondary | ICD-10-CM | POA: Diagnosis not present

## 2017-05-16 DIAGNOSIS — S51812D Laceration without foreign body of left forearm, subsequent encounter: Secondary | ICD-10-CM | POA: Diagnosis not present

## 2017-05-16 DIAGNOSIS — I110 Hypertensive heart disease with heart failure: Secondary | ICD-10-CM | POA: Diagnosis not present

## 2017-05-16 NOTE — Telephone Encounter (Signed)
Tried calling Kayla Thompson she was on another call she will be calling back. Dr. Jolene Provost that she is patients PCP and will sign off on plan of care

## 2017-05-16 NOTE — Telephone Encounter (Signed)
Verbals given to beth 

## 2017-05-16 NOTE — Telephone Encounter (Signed)
fine

## 2017-05-16 NOTE — Telephone Encounter (Signed)
Copied from [place] [phone]. Topic: Quick Communication - See Telephone Encounter >> May 16, 2017  9:45 AM Vernona Rieger wrote: CRM for notification. See Telephone encounter for:   05/16/17.   Beth RN from [employer] needs verbal orders that Dr Sharlet Salina is her PCP & will sign off on plan of care. [phone].

## 2017-05-16 NOTE — Telephone Encounter (Signed)
Copied from [place] [id number]. Topic: Quick Communication - See Telephone Encounter >> May 16, 2017 10:59 AM Blondell Reveal, CMA wrote: CRM for notification. See Telephone encounter for: 05/16/2017  Tried calling beth she was on another call she will be calling back. Dr. Sharlet Salina Isaias Cowman that she is patients PCP and will sign off on plan of care  05/16/17. >> May 16, 2017  2:55 PM Hewitt Shorts wrote: Sharyn Lull -from [place] needs a verbal home aid 2x  3 weeks   Best number [phone]

## 2017-05-16 NOTE — Telephone Encounter (Signed)
Fine

## 2017-05-16 NOTE — Telephone Encounter (Signed)
Verbal given 

## 2017-05-17 ENCOUNTER — Encounter (HOSPITAL_BASED_OUTPATIENT_CLINIC_OR_DEPARTMENT_OTHER): Payer: Medicare Other | Attending: Internal Medicine

## 2017-05-17 DIAGNOSIS — Z7952 Long term (current) use of systemic steroids: Secondary | ICD-10-CM | POA: Diagnosis not present

## 2017-05-17 DIAGNOSIS — Z9221 Personal history of antineoplastic chemotherapy: Secondary | ICD-10-CM | POA: Insufficient documentation

## 2017-05-17 DIAGNOSIS — E11622 Type 2 diabetes mellitus with other skin ulcer: Secondary | ICD-10-CM | POA: Insufficient documentation

## 2017-05-17 DIAGNOSIS — M069 Rheumatoid arthritis, unspecified: Secondary | ICD-10-CM | POA: Diagnosis not present

## 2017-05-17 DIAGNOSIS — I5032 Chronic diastolic (congestive) heart failure: Secondary | ICD-10-CM | POA: Diagnosis not present

## 2017-05-17 DIAGNOSIS — Z8543 Personal history of malignant neoplasm of ovary: Secondary | ICD-10-CM | POA: Insufficient documentation

## 2017-05-17 DIAGNOSIS — M353 Polymyalgia rheumatica: Secondary | ICD-10-CM | POA: Diagnosis not present

## 2017-05-17 DIAGNOSIS — L98493 Non-pressure chronic ulcer of skin of other sites with necrosis of muscle: Secondary | ICD-10-CM | POA: Insufficient documentation

## 2017-05-17 DIAGNOSIS — J449 Chronic obstructive pulmonary disease, unspecified: Secondary | ICD-10-CM | POA: Insufficient documentation

## 2017-05-17 DIAGNOSIS — Z794 Long term (current) use of insulin: Secondary | ICD-10-CM | POA: Insufficient documentation

## 2017-05-17 DIAGNOSIS — E1165 Type 2 diabetes mellitus with hyperglycemia: Secondary | ICD-10-CM | POA: Diagnosis not present

## 2017-05-17 DIAGNOSIS — I110 Hypertensive heart disease with heart failure: Secondary | ICD-10-CM | POA: Diagnosis not present

## 2017-05-17 DIAGNOSIS — M797 Fibromyalgia: Secondary | ICD-10-CM | POA: Diagnosis not present

## 2017-05-17 DIAGNOSIS — S51812D Laceration without foreign body of left forearm, subsequent encounter: Secondary | ICD-10-CM | POA: Diagnosis not present

## 2017-05-17 DIAGNOSIS — M810 Age-related osteoporosis without current pathological fracture: Secondary | ICD-10-CM | POA: Diagnosis not present

## 2017-05-17 DIAGNOSIS — S51012A Laceration without foreign body of left elbow, initial encounter: Secondary | ICD-10-CM | POA: Diagnosis not present

## 2017-05-18 ENCOUNTER — Ambulatory Visit (INDEPENDENT_AMBULATORY_CARE_PROVIDER_SITE_OTHER): Payer: Medicare Other | Admitting: Pulmonary Disease

## 2017-05-18 ENCOUNTER — Other Ambulatory Visit (INDEPENDENT_AMBULATORY_CARE_PROVIDER_SITE_OTHER): Payer: Medicare Other

## 2017-05-18 ENCOUNTER — Telehealth: Payer: Self-pay | Admitting: Pulmonary Disease

## 2017-05-18 ENCOUNTER — Ambulatory Visit (INDEPENDENT_AMBULATORY_CARE_PROVIDER_SITE_OTHER): Payer: Medicare Other

## 2017-05-18 ENCOUNTER — Encounter: Payer: Self-pay | Admitting: Pulmonary Disease

## 2017-05-18 VITALS — BP 112/80 | HR 92 | Temp 98.2°F | Ht 60.0 in | Wt 131.2 lb

## 2017-05-18 DIAGNOSIS — I5032 Chronic diastolic (congestive) heart failure: Secondary | ICD-10-CM | POA: Diagnosis not present

## 2017-05-18 DIAGNOSIS — R0902 Hypoxemia: Secondary | ICD-10-CM

## 2017-05-18 DIAGNOSIS — J441 Chronic obstructive pulmonary disease with (acute) exacerbation: Secondary | ICD-10-CM

## 2017-05-18 DIAGNOSIS — J479 Bronchiectasis, uncomplicated: Secondary | ICD-10-CM | POA: Diagnosis not present

## 2017-05-18 DIAGNOSIS — I110 Hypertensive heart disease with heart failure: Secondary | ICD-10-CM | POA: Diagnosis not present

## 2017-05-18 DIAGNOSIS — M797 Fibromyalgia: Secondary | ICD-10-CM | POA: Diagnosis not present

## 2017-05-18 DIAGNOSIS — M25522 Pain in left elbow: Secondary | ICD-10-CM | POA: Diagnosis not present

## 2017-05-18 DIAGNOSIS — Z794 Long term (current) use of insulin: Secondary | ICD-10-CM | POA: Diagnosis not present

## 2017-05-18 DIAGNOSIS — S51812D Laceration without foreign body of left forearm, subsequent encounter: Secondary | ICD-10-CM | POA: Diagnosis not present

## 2017-05-18 DIAGNOSIS — E1165 Type 2 diabetes mellitus with hyperglycemia: Secondary | ICD-10-CM | POA: Diagnosis not present

## 2017-05-18 DIAGNOSIS — I10 Essential (primary) hypertension: Secondary | ICD-10-CM | POA: Diagnosis not present

## 2017-05-18 DIAGNOSIS — Z7951 Long term (current) use of inhaled steroids: Secondary | ICD-10-CM | POA: Diagnosis not present

## 2017-05-18 DIAGNOSIS — M353 Polymyalgia rheumatica: Secondary | ICD-10-CM | POA: Diagnosis not present

## 2017-05-18 DIAGNOSIS — J45909 Unspecified asthma, uncomplicated: Secondary | ICD-10-CM | POA: Diagnosis not present

## 2017-05-18 DIAGNOSIS — Z7952 Long term (current) use of systemic steroids: Secondary | ICD-10-CM

## 2017-05-18 DIAGNOSIS — Z9181 History of falling: Secondary | ICD-10-CM

## 2017-05-18 DIAGNOSIS — M810 Age-related osteoporosis without current pathological fracture: Secondary | ICD-10-CM | POA: Diagnosis not present

## 2017-05-18 LAB — COMPREHENSIVE METABOLIC PANEL
ALT: 26 U/L (ref 0–35)
AST: 17 U/L (ref 0–37)
Albumin: 3.6 g/dL (ref 3.5–5.2)
Alkaline Phosphatase: 41 U/L (ref 39–117)
BUN: 22 mg/dL (ref 6–23)
CO2: 31 mEq/L (ref 19–32)
Calcium: 9.5 mg/dL (ref 8.4–10.5)
Chloride: 102 mEq/L (ref 96–112)
Creatinine, Ser: 1.01 mg/dL (ref 0.40–1.20)
GFR: 56.07 mL/min — ABNORMAL LOW (ref >60.00)
Glucose, Bld: 37 mg/dL — CL (ref 70–99)
Potassium: 4.6 mEq/L (ref 3.5–5.1)
Sodium: 139 mEq/L (ref 135–145)
Total Bilirubin: 0.5 mg/dL (ref 0.2–1.2)
Total Protein: 6.7 g/dL (ref 6.0–8.3)

## 2017-05-18 LAB — CBC WITH DIFFERENTIAL/PLATELET
Basophils Absolute: 0.2 10*3/uL — ABNORMAL HIGH (ref 0.0–0.1)
Basophils Relative: 0.9 % (ref 0.0–3.0)
Eosinophils Absolute: 0.5 10*3/uL (ref 0.0–0.7)
Eosinophils Relative: 2.6 % (ref 0.0–5.0)
HCT: 37.9 % (ref 36.0–46.0)
Hemoglobin: 12.6 g/dL (ref 12.0–15.0)
Lymphocytes Relative: 23.8 % (ref 12.0–46.0)
Lymphs Abs: 4.8 10*3/uL — ABNORMAL HIGH (ref 0.7–4.0)
MCHC: 33.1 g/dL (ref 30.0–36.0)
MCV: 100 fl (ref 78.0–100.0)
Monocytes Absolute: 0.9 10*3/uL (ref 0.1–1.0)
Monocytes Relative: 4.3 % (ref 3.0–12.0)
Neutro Abs: 13.7 10*3/uL — ABNORMAL HIGH (ref 1.4–7.7)
Neutrophils Relative %: 68.4 % (ref 43.0–77.0)
Platelets: 430 10*3/uL — ABNORMAL HIGH (ref 150.0–400.0)
RBC: 3.79 Mil/uL — ABNORMAL LOW (ref 3.87–5.11)
RDW: 14.1 % (ref 11.5–15.5)
WBC: 20.1 10*3/uL (ref 4.0–10.5)

## 2017-05-18 LAB — SEDIMENTATION RATE: Sed Rate: 23 mm/hr (ref 0–30)

## 2017-05-18 MED ORDER — IPRATROPIUM-ALBUTEROL 0.5-2.5 (3) MG/3ML IN SOLN: 3.0000 mL | Freq: Three times a day (TID) | RESPIRATORY_TRACT | 0 refills | Status: DC

## 2017-05-18 MED ORDER — LEVOFLOXACIN 750 MG PO TABS: 750.0000 mg | ORAL_TABLET | Freq: Every day | ORAL | 0 refills | Status: DC

## 2017-05-18 MED ORDER — METHYLPREDNISOLONE 4 MG PO TBPK: ORAL_TABLET | ORAL | 0 refills | Status: DC

## 2017-05-18 MED ORDER — METHYLPREDNISOLONE ACETATE 80 MG/ML IJ SUSP
80.0000 mg | Freq: Once | INTRAMUSCULAR | Status: AC
  Administered 2017-05-18: 80 mg via INTRAMUSCULAR

## 2017-05-18 NOTE — Telephone Encounter (Signed)
Lab just called with critical lab result. Glucose level 37.  Will route to [facility] and verbally tell Dr. Lenna Gilford.

## 2017-05-18 NOTE — Patient Instructions (Addendum)
Today we updated your med list in our EPIC system...    Continue your current medications the same...  We discussed using the new DUONEB in your neb machine 3 times daily (morn, mid-day, eve)    Followed by your ADVAIR twice daily & the [facility] once daily...  Continue the [facility] 1200mg  twice daily w/ lots of water...  Use the INCENTIVE SPIROMETER (lung exerciser) to get goo deep breaths & help w/ coughing up the mucus...  Today we did some follow up blood work & tried to get a good deep sputum specimen for culture...    We will contact you w/ the results when available...   In the meanwhile we gave you a Depo shot for the inflammation...    Start the MEDROL Dosepak 2/28 AM & follw directions on the pack...    Take the new ANTIBIOTIC- Levaquin one tab daily x 7d til gone...  Call for any questions or if we can be of service in any way...  Let's plan a follow up visit in 63mo, sooner if needed for problems.Marland KitchenMarland Kitchen

## 2017-05-18 NOTE — Progress Notes (Signed)
Subjective:     Patient ID: Kayla Thompson, female   DOB: [birth year], 82 y.o.   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  ~  January 07, 2015:  Initial pulmonary consult w/ SN>      45 y/o WF, referred by [name]) for a pulmonary evaluation due to cough & dyspnea>  [name] moved to [place] about 40mo ago to be near her daughter, granddaughter, and great-grands after her husb passed away;  Shortly after arrival she fell & was [facility] w/ fx nasal bones and left wrist (distal radius & ulna) managed by Burnett Sheng summary reviewed... She established Primary Care w/ DrKollar, [place], Endocrine- DrGherghe... She has a Hx of pulmonary problems identified by the Pt & records from [facility] as Bronchiectasis and Asthma;  Her CC is SOB/ DOE w/ min activity & it is apparent that she is rather sedentary and a poor historian; she notes mild cough, some yellow sputum, no hemoptysis; she denies CP, palpit, or signif cardiac history...  Smoking Hx>  She is a never smoker, but had signif 2nd hand exposure from her husb...  Pulmonary Hx>  She indicates hx left lung surg 2000 for benign granuloma; hx bronchiectasis but she does not know how the Dx was established; told asthma as well ever since early 2000's but not as a child or young adult; states she had pneumonia age 87 at same time as the measles (not hosp & made full recovery w/o apparent residual resp problems); she does admit to recurrent bronchitic infections as adult requiring antibiotics & occas Pred rx... She adds a rambling hx of work-ups at [facility] (in [place] in [place] but she does not know what this was for?       Notes from Dr. Mick Sell in [place]> Bronchiectasis, mult pulm nodules, & mild Asthma; prev pulm nodule resected in 2000 which proved to be a granuloma; he reported that an atypical  mycobacterium was found at the time of lung bx in the past- no further details provided; seen for cough w/ yellow sput & incr dyspnea- reported to be stable on Advair500Bid, Spiriva daily, Singulair10, NEB w/ Albut-Bid, Mucinex, VentolinHFA rescue prn; she was also on Pred 3mg /d for PMR from her Rheum;   Medical Hx>  Diastolic Dysfunction on 2DEcho, mild AS, HL, IDDM, Hypothyroid, GERD, DJD w/ TKR and ankle surg; Fibromyalgia on Lyrica/ Percocet/ Tramadol; PMR per Rheum in [place] on Pred2mg /d x 52yr (it was last dropped from 3mg  ~54yr ago); Osteoporosis; Anemia; Anxiety.   Family Hx>  Mother died from lung cancer (as did one of her sisters);  Father passed from heart disease.  Occup Hx>  No known occupational exposures  Current Meds>  SEE FULL MED LIST BELOW EXAM showed Afeb, VSS, O2sat=99% on RA;  Heent- neg, mallampati3;  Chest- decr BS bilat, not congested, w/o w/r/r heard;  Heart- RR gr1/6SEM w/o r/g detected;  Abd- soft, nontender, +panniculus;  Ext- VI w/o c/c/e...   CT Chest 11/17/09 in [place]> mult noncalcif pulm nodules bilat (stable when compared to older scans), mucoid impaction LUL & RML, pleuroparenchymal scarring right base (unchanged), no adenopathy, norm heart size & coronary calcif.  PFTs 02/04/10 in [place]> FVC=2.20 (95%), FEV1=1.42 (88%), %1sec=65, mid-flows reduced at 38% predicted; no improvement in FEV1 after bronchodil; TLC=4.70 (118%), RV=2.48 (153%), RV/TLC=53%; DLCO=54% predicted.  CXR 01/14/14 reported to show norm heart  size, clear lungs, thoracic spondylosis, NAD...   CXR 08/02/14 in ER here after fall showed norm heart size, diffuse interstitial coarsening w/o focal airsp dis...   2DEcho 08/02/14 in [place] after fall showed norm LVF w/ EF=55-60%, no regional wall motion abn, Gr1 DD, mild AS w/ mean gradient 48mmHg; mod calcif MV leaflets w/o stenosis or regurg; RV looked OK but could not est PAsys...  CXR here 01/07/15 showed norm heart size, incr AP diameter of chest & flat  diaphragms, incr markings diffusely, old right rib fxs, osteopenia...   Spirometry 01/07/15> we attempted mult trial of simple spirometry but pt could not generate an acceptable curve & results are not reliable...  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 We will follow closely.    Cardiac> on Coreg6.25Bid for ?HBP; she has mild AS & DiastCHF on 2DEcho and this needs follow up...    Endocrine> IDDM, Hypothyroid, transiently elev serum calcium ?etiology (resolved spont); managed by DrGherghe; BS today is 45 & asked to eat & check w/ endocrine re- adjustment in Novolog...    GYN> Hx ovarian cancer (in remission- details unknown), s/p hyst    Ortho/ Rheum> DJD w/ TKR, FM on Lyrica etc, severe LBP requires epidural shots; PMR on Pred (details unknown); her Sed=52 and she will prob need Rheum to help sort this out...    Heme> Hg 10-11 range May2016 after fall w/ fx nose & wrist; similar now & may need further anemia work-up as well...  ADDENDUM>>  Hi-res CT Chest 01/14/15:  Norm heart size, atherosclerosis of Ao/ great vessels/ coronaries, no adenopathy, atrophic thyroid; ?small sliding pulm hernia betw the lat 7th & 8th ribs w/ scarring; extensive bronchiectasis bilat w/ mucoid impactions within dilated airways (infectious/inflamm bronchiolitis- r/o MAI), 1mm LLL  nodule, tracheobronchomalacia w/ extensive bilat air trapping on expiration, smallHH, old healed right 6th-7th rib fx, DJD Tspine...  REC> continue NEBS, Advair, Spiriva, Mucinex (maximize to 600mg  Qid w/ fluids), practice purse lip breathing to splint the airways open & aide in expectoration of phlegm, use FLUTTER valve & postural drainage to get the sput out; collect sput for routine C&S, AFB, Fungi => TC w/ pt: she has a lot going on, refuses further meds, refuses further eval, can't be bothered w/ learning Chest PT/ Flutter, postural drainage, purse lip breathing, collecting sputum specimen, etc; states she's had all this a long time & not interested in more aggressive pulmonary treatments at this time... She will f/u w/ me in November & we will discuss again.  ADDENDUM>>  Records from Dr. Mick Sell, Pulmonary in [place]=> note of 07/18/13:  Hx mild asthma & bronchiectasis, hx mult pulm nodules followed for several yrs & one removed surgically in 2000 that proved to be a granuloma & report of an atypical mycobacterium being found at the same time (but no details given), on NEB w/ Albuterol,  Advair, Spiriva, Singulair, Mucinex, low-dose Pred for PMR;  She was given all indicated vaccinations;  No change in her med regimen for yrs...   ~  February 19, 2015:  6wk ROV w/ SN>  SEE ABOVE-- pt notes nasal congestion, chest congestion, sl cough & incr SOB for the last wk or so; in the next breath she says "I seldom get a cold", thinks it's all allergies, notes great-grnads recently ill;  She has NEBS w/ Albut (only using once per day), Advair500Bid, Spiriva once daily, Mucinex (?how much), Pred?1mg - 3/d, Singulair10/ Zyrtek10...  Since she was here last she has followed up w/ PCP-DrCrawford 10/25> FM doing ok on Lyrica & she reported more functional & resting better...   She also had a f/u w/ Endocrine-DrGherghe 11/8> DM, osteoporosis, hypothyroid, & Hx hyperpara; Labs reviewed & ok (A1c=6.5 on her insulin,  Ca is wnl and vitD=66)...    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     Cardiac> on Coreg6.25Bid for ?HBP; she has mild AS & DiastCHF on 2DEcho and this needs follow up w/ her PCP vs Cards...    Endocrine> IDDM, Hypothyroid, transiently elev serum calcium ?etiology (resolved spont); managed by DrGherghe...    GYN> Hx ovarian cancer (in remission- details unknown), s/p hyst & Ca125=9 (08/2014)...    Ortho/ Rheum> DJD w/ TKR, FM on Lyrica, Tramadol, Percocet,etc, severe LBP requires epidural shots; PMR on Pred (details unknown); her Sed=52 and she will prob need Rheum to help sort this out...    Heme> Hg 10-11 range May2016 after fall w/ fx nose & wrist; similar now & may need further anemia work-up as well... EXAM showed Afeb, VSS, O2sat=93% on RA;  Wt=162#;  Heent- neg, mallampati3;  Chest- decr BS bilat, not congested, w/o w/r/r heard;  Heart- RR gr1/6SEM w/o r/g detected;  Abd- soft, nontender, +panniculus;  Ext- VI w/o c/c/e...   Pt given a neb treatment in office- unable to produce any sput for cultures... IMP/PLAN>>  We discussed Rx w/ ZPak for her URI (it could also help her if she has atyp-mycobacterium); we compromised rx w/ NEBS TID- followed by AdvairBid & Spiriva once daily, plus the MUCINEX1200mg Bid w/ fluids;  She again declines the added benefit from a vigorous chest PT regimen, flutter valve, postural drainage, purse lip breathing, etc... we plan a recheck in 3 months...  ~  June 23, 2015:  75mo ROV  w/ SN>  Alonna returns unaccompanied and states that her breathing is good- no problem she says; notes min cough, sm amt whitish sput (no color, no blood), admits to mild SOB/DOE but swears there is no progression, and denies CP/ f/c/s/ etc... She states that she was INTOL to ZPak given last OV but can't recall the reaction ("?didn't work for me"), doesn't want it again;  We reviewed her meds & again she is not being aggressive enough to effectively treat her bronchiectasis & mucoid impactions> eg. Only using the Albut NEBS prn & ave once Qod, only using the Mucinex 1200 once daily, she says she is using the ADVAIR500Bid, SPIRIVA once daily, SINGULAIR10, PRED1mg -2tabs  Qam ?per PCP/ ?Rheum... 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    Cardiac> on Coreg6.25- 1/2Bid for ?HBP; she has mild AS & DiastCHF on 2DEcho and this needs follow up w/ her PCP vs Cards...    Endocrine> IDDM on Lantus & Novolog (A1c=6.3), HL on Crestor10 (FLP- controlled), Hypothyroid on Synthroid50, transiently elev serum calcium ?etiology (resolved spont), osteoporosis w/ mult fxs on Prolia Q55mo; managed by DrGherghe & last seen 06/09/15- note reviewed...    GYN> Hx ovarian cancer (in remission- details unknown), s/p hyst & Ca125=9 (08/2014)...    Ortho/ Rheum> DJD w/ TKR, FM on Lyrica,  Tramadol, Percocet,etc, severe LBP requires epidural shots; she is on a huge dose of Lyrica; PMR on Pred (details unknown); her Sed=52 and she will prob need Rheum to help sort this out...    Neuro> on Aricept5 per DrCrawford, I wonder if she is taking any/ all of her meds properly, no care giver accompanies pt to office visits, she is ?stoic ?oblivious...    Heme> Hg 10-11 range May2016 after fall w/ fx nose & wrist; similar now & may need further anemia work-up as well, deferred to PCP... EXAM showed Afeb, VSS, O2sat=98% on RA;  Wt=161#;  Heent- neg, mallampati3;  Chest- decr BS bilat, not congested, w/o w/r/r heard;  Heart- RR gr1/6SEM w/o r/g detected;  Abd- soft, nontender, +panniculus;  Ext- VI w/o c/c/e...   LABS 2016:  Reviewed... IMP/PLAN>>  We again outlined for MrsNatter a vigorous home treatment regimen for her bronchiectasis & mucoid impactions including a TID regimen>  NEB w/ Albut vs Duoneb Tid, followed by her Advair500Bid & Spiriva once/d, Guaifenesin 400mg  tabs- 2Tid w/ fluids, take her low dose Pred in AM & Singulair in PM; she declines to do chest PT/ Flutter & postural drainage treatments...  ~  October 01, 2015:  42mo ROV & add-on appt requested by pt for COPD exac>  She reports that she went to a granddaughter's wedding in [place] 6/24, then spent 1wk in [place] and "caught a cold", didn't go to UC or see a physician, just "toughed it out", getting better slowly but symptoms lingering she says> head is woozy, nasal drainage, sl cough, white sput (no discoloration or blood), sl tight/ wheezy/ congested but no CP/ f/c/s, etc... She reminds me that "ZPak doesn't work for me", and she wants Doxy;  It is apparent that she is still NOT doing her Resp treatment regimen as I have prev requested & outlined for her (see below)- acts as if this is the first time she has heard this from me, she remains on Aricept5 from PCP & has not seen Neuro...we reviewed the following medical problems during today's office  visit >>     Pulmonary> Hx COPD/ Bronchiectasis/ Abn CT Chest w/ mucoid impactions, tracheobronchomalacia w/ air trapping, 10mm LLL nodule, Hx granulomatous lung dis & reported atyp mycobacterium in  the past>  See Hi-res CTChest 12/2014;  We encouraged a regular bronchodilator regimen (NEBS w/ Albut vs Duoneb Tid, ADVAIR500Bid, SPIRIVA daily), Guaifenesin 400mg -2Tid, Singulair10, Fluids, Flutter/ postural drainage/ purse-lip breathing exercises, & check sput for cultures but she could not be bothered & refused further pulm treatment/ testing sput/ etc=> still not cooperating w/ max chest physiotherapy regimen & states she only gets a sm amt of whitish sput (unable to successfully induce sput in office...    Cardiac> on Coreg6.25- 1/2Bid for ?HBP; she has mild AS & DiastCHF on 2DEcho and this needs follow up w/ her PCP vs Cards...    Endocrine> IDDM on Lantus & Novolog (A1c=6.3), HL on Crestor10 (FLP- controlled), Hypothyroid on Synthroid50, transiently elev serum calcium ?etiology (resolved spont), osteoporosis w/ mult fxs on Prolia Q64mo; managed by DrGherghe & last seen 06/09/15- note reviewed...    GYN> Hx ovarian cancer (in remission- details unknown), s/p hyst & Ca125=9 (08/2014)... She is followed by DrFernandez now...    Ortho/ Rheum> DJD w/ TKR, FM on Lyrica, Tramadol, Percocet,etc, severe LBP requires epidural shots; she is on a huge dose of Lyrica; PMR on Pred (details unknown); her Sed=52 and she saw [facility] for Rheum last 09/03/15- severe OA, FM, PMR, & osteoporosis on Lyrica, Pred, Percocet, & Prolia; she felt worse when out of her Lyrica    Neuro> on Aricept5 per DrCrawford, I wonder if she is taking any/ all of her meds properly, no care giver accompanies pt to office visits, she is ?stoic ?oblivious...    Heme> Hg 10-11 range May2016 after fall w/ fx nose & wrist; similar now & may need further anemia work-up as well, deferred to PCP... EXAM showed Afeb, VSS, O2sat=96% on RA;  Wt=167#;  Heent-  neg, mallampati3;  Chest- decr BS bilat, not congested, w/o w/r/r heard;  Heart- RR gr1/6SEM w/o r/g detected;  Abd- soft, nontender, +panniculus;  Ext- VI w/o c/c/e...   CXR 11/04/15 showedCOPD, scarring in right mid lung, chr changes and NAD... IMP/PLAN>>  Berneice has a URI & mild COPD exac=> we will treat w/ Doxy100Bid (her request) and Medrol dosepak;  We again reviewed the recommended treatment protocol for her bronchiectasis (it was like she heard it for the 1st time)>  NEBS Tid, followed by [facility] & SPIRIVA daily, +Guaifenesin400-2Tid + Fluids, take the low dose Pred in AM (per Rheum) and the Singulair10 in PM, work to expectorate the phlegm but she declines to do chest PT, Flutter, postural drainage, etc... we plan ROV recheck in 15mo...  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    GI> prev eval apparently in [place] pt doesn't recall, no records avail, she has Prevacid30 & Zantac150Bid listed on med list but she says just prn... EXAM showed Afeb, VSS, O2sat=97% on RA;  Wt=162#;  Heent- neg, mallampati3;  Chest- decr BS bilat, not congested, w/o w/r/r heard;  Heart- RR gr1/6SEM w/o r/g detected;  Abd- soft, nontender, +panniculus;  Ext- VI w/o c/c/e...  IMP/PLAN>>  MsNatter has serious multisys dis & is managed by DrCrawford-PCP;  From the pulm standpoint she continues to downplay the severity of her lung dis & still refuses/ forgets/ etc to do her breathing treatments as we have outlined for her- REC to use NEB Tid, followed by Advair500Bid, Spiriva once dailyGFN400-2Tid w/ fluids, Singulair10, & Flutter valve/ postural drain/ purse-lip breathig treatments TID... Today we reviewed this regimen & added a Medrol Dosepak... She now appears to have signif GI-GERD/ reflux/ prob LPR & we added a vigorous antireflux regimen=> Prevacid30 taken 30 miin before dinner, NPO after dinner, elev HOB 6" blocks... She needs to see GI for further eval & needs these recommendations reinforced by them & her PCP...  ~  April 13, 2016:  40mo ROV & once again MsNatter returns by herself for a f/u visit> she tells me that she is doing satis, no new complaints or concerns;  When asked about her meds she says they are all the same but she does not know the names or the dosing regimen;  She states her breathing is the same- min SOB w/o change- eg w/ walking (says lim by knee); notes mild cough/ sput- clear w/o color or blood seen, no CP;  Today's agenda involves getting a handicap placard- we filled it out for her... 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 We re-iterate the max regimen at each office visit & print it out on the AVS for family to review & help pt with...    Cardiac> on Coreg6.25- 1/2Bid for ?HBP; she has mild AS & DiastCHF on 2DEcho and this needs follow up w/ her PCP (DrCrawford) vs Cards consult ...    Endocrine> IDDM on Lantus & Novolog (A1c=6.3), HL on Crestor10 (FLP- controlled), Hypothyroid on Synthroid50, transiently elev serum calcium ?etiology (resolved spont), osteoporosis w/ mult fxs on Prolia Q67mo; managed by DrGherghe but last seen 06/09/15- note reviewed; she sees DrCrawford frequently...    GYN> Hx  ovarian cancer (in remission- details unknown), s/p hyst & Ca125=9 (08/2014)... She is followed by DrFernandez now...    Ortho/ Rheum> DJD w/ TKR, FM on Lyrica, Tramadol, Percocet,etc, severe LBP requires epidural shots; she is on a huge dose of Lyrica; PMR on Pred (details unknown); her Sed=52 and she saw [facility] for Rheum 03/23/16- 26mo rov for  severe OA, FM, PMR, & osteoporosis on Lyrica 300Bid, Pred 2mg /d, Percocet, & Prolia (last dose 02/24/16); she felt worse when out of her Lyrica    Neuro & Psyche> on Aricept5 per DrCrawford plus Buspar & Cymbalta- I wonder if she is taking any/ all of her meds properly, no care  giver accompanies pt to office visits, she is ?stoic ?oblivious...    Heme> Hg 10-11 range May2016 after fall w/ fx nose & wrist; similar now & may need further anemia work-up as well, deferred to PCP... EXAM showed Afeb, VSS, O2sat=95% on RA;  Wt=160#;  Heent- neg, mallampati3;  Chest- decr BS bilat, not congested, w/o w/r/r heard;  Heart- RR gr1/6SEM w/o r/g detected;  Abd- soft, nontender, +panniculus;  Ext- VI w/o c/c/e...   Ambulatory Oximetry 04/13/16>  O2sat=92% on RA at rest w/ pulse= 80/min... She ambulated 1 Lap in office (185') w/ O2sat drop to 84% with pulse=89/min... IMP/PLAN>>  Zayonna has exercise induced hypoxemia & needs a POC w/ O2 at 2L/min w/ activity;  We will check an ONO in the interim to see if she needs nocturnal O2 as well;  In the meanwhile we once again reviewed w/ the pt regarding her MAX respiratory regimen-- Albut via NEB Tid, followed by Advair500Bid & Spiriva daily; continue GFN 400mg -2Tid w/ fluids; on Pred 2mg /d by rheum for PMR; on Singulair10 & flonase;  We also reviewed her antireflux regimen w/ Prevacid30 taken 30 min before dinner, NPO after dinner, elev HOB 6"... we plan another recheck in 43mo...  ~  July 12, 2016:  4mo ROV & Marlaine is here by herself again today- no complaints or concerns- states her breathing is good, she uses her portable O2 as needed (she doesn't like the O2 conc- makes too much noise, gives her migraines, etc), denies CP/ palpt/ SOB/ edema; notes min cough small amt whitish sput, no hemoptysis; denies f/c/s and says she is active & exercising on her own (does not go to gym etc- we discussed silver sneakers etc...    Her PCP is DrCrawford, requested referral to Urology for urinary incont, given handicap placard for parking, wants a new eye doctor due to droopy eyelids (rec [facility]), pt says that [facility] checked her anemia- "it's good, not to worry"... We reviewed the following medical problems during today's office visit >>     Pulmonary> Hx COPD/  Bronchiectasis/ Abn CT Chest w/ mucoid impactions, tracheobronchomalacia w/ air trapping, 55mm LLL nodule, Hx granulomatous lung dis & reported atyp mycobacterium in the past>  See Hi-res CTChest 12/2014;  We encouraged a regular bronchodilator regimen (NEBS w/ Albut vs Duoneb Tid, ADVAIR500Bid, SPIRIVA daily), Guaifenesin 400mg -2Tid, Singulair10, Fluids, Flutter/ postural drainage/ purse-lip breathing exercises, & check sput for cultures but she could not be bothered & refused further pulm treatment/ testing sput/ etc=> still not cooperating w/ max chest physiotherapy regimen & states she only gets a sm amt of whitish sput (unable to successfully induce sput in office... We re-iterate the max regimen at each office visit & print it out on the AVS for family to review & help pt with...    Cardiac> on Coreg6.25-  1/2Bid for ?HBP; she has mild AS & DiastCHF on 2DEcho and this needs follow up w/ her PCP (DrCrawford) vs Cards consult ...    Endocrine> IDDM on Lantus & Novolog (A1c=6.3), HL on Crestor10 (FLP- controlled), Hypothyroid on Synthroid50, transiently elev serum calcium ?etiology (resolved spont), osteoporosis w/ mult fxs on Prolia Q11mo; managed by DrGherghe but last seen 06/09/15- note reviewed; she sees DrCrawford frequently...    GYN> Hx ovarian cancer (in remission- details unknown), s/p hyst & Ca125=9 (08/2014)... She is followed by DrFernandez now...    Ortho/ Rheum> DJD w/ TKR, FM on Lyrica, Tramadol, Percocet,etc, severe LBP requires epidural shots; she is on a huge dose of Lyrica; PMR on Pred (details unknown); her Sed=52 and she saw [facility] for Rheum 03/23/16- 108mo rov for  severe OA, FM, PMR, & osteoporosis on Lyrica 300Bid, Pred 2mg /d, Percocet, & Prolia (last dose 02/24/16); she felt worse when out of her Lyrica    Neuro & Psyche> on Aricept5 per DrCrawford plus Buspar & Cymbalta- I wonder if she is taking any/ all of her meds properly, no care giver accompanies pt to office visits, she is ?stoic  ?oblivious...    Heme> Hg 10-11 range May2016 after fall w/ fx nose & wrist; similar now & may need further anemia work-up as well, deferred to PCP... EXAM showed Afeb, VSS, O2sat=97% on RA;  Wt=151#;  Heent- neg, mallampati3;  Chest- decr BS bilat, not congested, w/o w/r/r heard;  Heart- RR gr1/6SEM w/o r/g detected;  Abd- soft, nontender, +panniculus;  Ext- VI w/o c/c/e...   Ambulatory Oximetry 07/12/16>  O2sat=94% on RA at rest w/ pulse=97/min;  She ambulated 2 laps in office (185'ea) w/ lowest O2sat=88% w/ pulse=112/min;  This is improved from [id number];  Advised O2 at 2L/min w/ activities...  IMP/PLAN>>  Pt is asked to use O2 w/ ambulation & reminded again about the recommended pulm Rx- NEBS w/ Albut vs Duoneb Tid, ADVAIR500Bid, SPIRIVA daily, Guaifenesin 400mg -2Tid, Singulair10, Fluids, Flutter/ postural drainage/ purse-lip breathing exercises; call for any breathing issues, rov ~32mo...  ~  December 01, 2016:  62mo ROV & Rozlyn is here by herself again today (daughter brought her but is waiting in the car per pt)>  She was referred by DrCrawford for pulm eval due to cough & dyspnea, records from [facility] indicated a hx of asthma/ bronchiectasis/ mult pulm nodules, she had left lung surg in 2000 for a benign granuloma & there was a question of an atypical mycobacterium; she is rather sedentary & poorly conditioned; she is a never smoker w/ 2nd hand smoke exposure;  CT Chest 10/16 showed> Norm heart size, atherosclerosis of Ao/ great vessels/ coronaries, no adenopathy, atrophic thyroid; ?small sliding pulm hernia betw the lat 7th & 8th ribs w/ scarring; extensive bronchiectasis bilat w/ mucoid impactions within dilated airways (infectious/inflamm bronchiolitis- r/o MAI), 80mm LLL nodule, tracheobronchomalacia w/ extensive bilat air trapping on expiration, [place], old healed right 6th-7th rib fx, DJD Tspine... She has demonstrated exercise hypoxemia & never did the requested ONO- she refuses to use her Port O2  stating "I have allergies" and uses it prn only; she lives w/ daughter but she never comes back to the exam room w/ her mother during the OVs; pt says she rests well, not SOB, breathing well & denies cough/ sput/ hemoptysis, CP, f/c/s, etc... We have stressed a MAX bronchodil regimen w/ NEBS w/ Albut vs Duoneb Tid, ADVAIR500Bid, SPIRIVA daily, Guaifenesin 400mg -2Tid, Singulair10, Fluids, Flutter/ postural drainage/ purse-lip breathing exercises and an antireflux  regimen...     She has numerous medical specialists per Epic review>  Ophthalmology- DrShapiro, sent to oculofacial plastic surg DrWhite, Endocrine- DrGherghe, [place], PCP- DrCrawford, on Prolia q1mo... EXAM showed Afeb, VSS, O2sat=100% on RA;  Wt=144#;  Heent- neg, mallampati3;  Chest- decr BS bilat, not congested, w/o w/r/r heard;  Heart- RR gr1/6SEM w/o r/g detected;  Abd- soft, nontender, +panniculus;  Ext- VI w/o c/c/e...  IMP/PLAN>>  She is reminded about her mandatory pulm regimen & this is again written for her to share w/ family/ care givers but she remains unconcerned, indifferent, not motivated...   ~  February 24, 2017:  39mo ROV & add-on appt> pt has returned for oxygen recertification per [employer]> Their letter indicates that a new CMN is needed after 39mo of service & requires re-eval within 90d of the new medicare oxygen CMN... SEE ABOVE NOTES-- She was referred by DrCrawford for pulm eval due to cough & dyspnea, records from [facility] indicated a hx of asthma/ bronchiectasis/ mult pulm nodules, she had left lung surg in 2000 for a benign granuloma & there was a question of an atypical mycobacterium; she is rather sedentary & poorly conditioned; she is a never smoker w/ 2nd hand smoke exposure;  CT Chest 10/16 showed> Norm heart size, atherosclerosis of Ao/ great vessels/ coronaries, no adenopathy, atrophic thyroid; ?small sliding pulm hernia betw the lat 7th & 8th ribs w/ scarring; extensive bronchiectasis bilat w/ mucoid impactions  within dilated airways (infectious/inflamm bronchiolitis- r/o MAI), 12mm LLL nodule, tracheobronchomalacia w/ extensive bilat air trapping on expiration, [place], old healed right 6th-7th rib fx, DJD Tspine... She has demonstrated exercise hypoxemia & never did the requested ONO- she refuses to use her Port O2 stating "I have allergies" and uses it prn only; she lives w/ daughter but she never comes back to the exam room w/ her mother during the OVs; pt says she rests well, not SOB, breathing well & denies cough/ sput/ hemoptysis, CP, f/c/s, etc... We have stressed a MAX bronchodil regimen w/ NEBS w/ Albut vs Duoneb Tid, ADVAIR500Bid, SPIRIVA daily, Guaifenesin 400mg -2Tid, Singulair10, Fluids, Flutter/ postural drainage/ purse-lip breathing exercises and an antireflux regimen...  EXAM showed Afeb, VSS, O2sat=100% on RA;  Wt=144#;  Heent- neg, mallampati3;  Chest- decr BS bilat, not congested, w/o w/r/r heard;  Heart- RR gr1/6SEM w/o r/g detected;  Abd- soft, nontender, +panniculus;  Ext- VI w/o c/c/e...   Ambulatory oximetry 02/24/17>  O2sat=96% on RA at rest w/ pulse=87/min;  She ambulated 3 laps (185'ea) in the office w/ nadir O2sat=88% w/ pulse=113/min IMP/PLAN>>  She has an O2 conc at home & not using it, won't use it at night (too noisey, the cord is yoo long, she's NEVER usedit she says);  [employer]- uses "as needed" "I like it" she says, averages once per week "to give myself a breather";  She didn't take it on her trip to [facility], not using it when out & about, uses it some when doing PT exercises... She is again reminded to use the O2 at 1L/min Qhs & should check w/ DME [employer] regarding the cord & machine noise;  Advised to use the O2 at 2L/min w/ ambulation;  Continue Rx w/ NEB rx Tid followed by Advair250Bid & Spiriva once daily, mucinex 1200Bid + fluids, and the antireflux regimen- Axid150 ~106min before dinner, NPO after dinner, elev HOB 6", ok Zantac150 at bedtime w/ sip of water... We plan ROV in  104mo w/ CXR...   ~  May 18, 2017:  32mo ROV & add-on appt requested for increased cough, brown mucus, chest soreness>  Nini returns for an add-on appt w/ her daughter from [place] c/o several days hx cogh, yellow brown mucus, incr SOB, etc;  She fell 6wks ago w/ ER eval after injuring lower left ribs & laceration at left elbow;  CXR/Ribs 04/07/17 showed no acute fx, stable chr changes in both lungs- NAD;  Placed on Doxy & given hydrocodone for prn use...     Rye says she knows her meds and is great w/ the dosages; they have upcoming appt at the wound care clinic due to the elbow laceration healing...    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  EXAM showed Afeb, VSS, O2sat=96% on RA;  Wt=131#;  Heent- neg, mallampati3;  Chest- decr BS bilat, not congested, w/o w/r/r heard;  Heart- RR gr1/6SEM w/o r/g detected;  Abd- soft, nontender, +panniculus;  Ext- VI w/o c/c/e...   LABS 05/18/17>  [place]- ok x BS=37 (asked to eat ASAP), Cr=1.01, LFTs- wnl;  CBC- Hg=12.6, WBC=20.1;  Sed=23  Sputum C&S> she was unable to collect a specimen while here in the office... IMP/PLAN>>  Beaulah has a COPD exac w/ yellow brown mucus and WBC ct of 20K-- we deiced to treat w/ LEVAQUIN 750mg  one daily x 7d, and give her a Depo shot & Medrol dosepak for the bronchial inflamm;  We are going to send in new rx for DUONEB to [place] ask her (again) to do this TID regularly followed by the Advair bid & Spiriva Qd;  She will continue the [facility] 1200mg  Bid, fluids, and use the IS Tid as part of her breathing treatments    Past Medical History:  Diagnosis Date  . Arthritis   . Asthma   . Diabetes mellitus without complication ([place])    . Fibromyalgia   . GERD (gastroesophageal reflux disease)   . History of chemotherapy   . History of fractured pelvis   . Hypertension   . Osteoporosis   . Osteoporosis   . Ovarian cancer [facility])     Past Surgical History  Procedure Laterality Date  . Hip surgey    . Knee surgey    . Lung surgery >> resection of nodule 2000, proved to be a benign granuloma & ?atyp mycobacterium ident at the same time?    . Bladder suspension    . Ankle fracture surgery      plate and screws  . Abdominal hysterectomy  1996    Outpatient Encounter Medications as of 05/18/2017  Medication Sig  . albuterol (PROVENTIL HFA;VENTOLIN HFA) 108 (90 Base) MCG/ACT inhaler Inhale 2 puffs into the lungs every 6 (six) hours as needed for wheezing or shortness of breath.  . Apoaequorin (PREVAGEN) 10 MG CAPS Take 10 m by mouth daily.  . BD PEN NEEDLE NANO U/F 32G X 4 MM MISC USE 4 TIMES A DAY  . carvedilol (COREG) 6.25 MG tablet Take 3.125 mg by mouth 2 (two) times daily with a meal.   . denosumab (PROLIA) 60 MG/ML SOLN injection Inject 60 mg into the skin every 6 (six) months. Administer in upper arm, thigh, or abdomen  . Dexlansoprazole 30 MG capsule Take 1 capsule (  30 mg total) by mouth daily.  Marland Kitchen Dextromethorphan-Guaifenesin (MUCINEX DM MAXIMUM STRENGTH) 60-1200 MG TB12 Take 1 tablet by mouth 2 (two) times daily as needed (for cough/phlegm). Reported on 06/23/2015  . donepezil (ARICEPT) 5 MG tablet TAKE 1 TABLET (5 MG TOTAL) BY MOUTH AT BEDTIME.  . DULoxetine HCl (CYMBALTA PO) Take 90 mg by mouth daily.  . Fluticasone-Salmeterol (ADVAIR DISKUS) 500-50 MCG/DOSE AEPB Inhale 1 puff into the lungs 2 (two) times daily.  Marland Kitchen glucose blood (FREESTYLE TEST STRIPS) test strip Use to test blood sugar 3 times daily. Dx: E11.65  . Insulin Glargine (LANTUS SOLOSTAR) 100 UNIT/ML Solostar Pen INJECT 20 UNITS SUBCUTANEOUSLY EVERY MORNING  . insulin lispro (HUMALOG KWIKPEN) 100 UNIT/ML KiwkPen Inject 4-6 units into the skin daily.   . Lancets (FREESTYLE) lancets Use to test blood sugar 3 times daily. Dx: E11.65  . LANTUS SOLOSTAR 100 UNIT/ML Solostar Pen INJECT 30 UNITS SUBCUTANEOUSLY EVERY MORNING  . levothyroxine (SYNTHROID, LEVOTHROID) 50 MCG tablet Take 1 tablet (50 mcg total) by mouth daily before breakfast.  . montelukast (SINGULAIR) 10 MG tablet TAKE 1 TABLET (10 MG TOTAL) BY MOUTH DAILY.  Marland Kitchen predniSONE 2 MG TBEC Take 2 mg by mouth daily.  . pregabalin (LYRICA) 100 MG capsule Take 1 capsule (100 mg total) by mouth 2 (two) times daily.  . ranitidine (ZANTAC) 150 MG tablet Take 150 mg by mouth 2 (two) times daily as needed for heartburn.  . rosuvastatin (CRESTOR) 10 MG tablet Take 1 tablet (10 mg total) by mouth every evening.  . tiotropium (SPIRIVA HANDIHALER) 18 MCG inhalation capsule Place 1 capsule (18 mcg total) into inhaler and inhale daily.  . traMADol (ULTRAM) 50 MG tablet Take 1 tablet (50 mg total) by mouth every 8 (eight) hours as needed.  . trimethoprim (TRIMPEX) 100 MG tablet Take 100 mg by mouth daily.  . [DISCONTINUED] albuterol (PROVENTIL) (2.5 MG/3ML) 0.083% nebulizer solution Take 3 mLs (2.5 mg total) by nebulization daily.  . [DISCONTINUED] diclofenac sodium (VOLTAREN) 1 % GEL Apply 2 g topically 4 (four) times daily.  . [DISCONTINUED] DULoxetine (CYMBALTA) 60 MG capsule Take 60 mg by mouth daily.  . [DISCONTINUED] fluticasone (FLONASE) 50 MCG/ACT nasal spray Place 1-2 sprays into both nostrils daily as needed for allergies or rhinitis.  . [DISCONTINUED] predniSONE (DELTASONE) 1 MG tablet Take 2 mg by mouth daily with breakfast.   . ipratropium-albuterol (DUONEB) 0.5-2.5 (3) MG/3ML SOLN Take 3 mLs by nebulization 3 (three) times daily.  Marland Kitchen levofloxacin (LEVAQUIN) 750 MG tablet Take 1 tablet (750 mg total) by mouth daily.  . methylPREDNISolone (MEDROL DOSEPAK) 4 MG TBPK tablet 6 day pak-take as directed  . mirabegron ER (MYRBETRIQ) 50 MG TB24 tablet Take 1 tablet (50 mg total) by mouth daily. (Patient  not taking: Reported on 05/18/2017)  . zolpidem (AMBIEN) 5 MG tablet Take 1 tablet (5 mg total) by mouth at bedtime. (Patient not taking: Reported on 05/18/2017)   No facility-administered encounter medications on file as of 05/18/2017.     Allergies  Allergen Reactions  . Latex Rash  . Penicillins Rash     Immunization History  Administered Date(s) Administered  . Influenza, High Dose Seasonal PF 12/01/2016  . Influenza,inj,Quad PF,6+ Mos 01/17/2015, 12/18/2015  . Pneumococcal Polysaccharide-23 12/18/2015  . Tdap 08/02/2014, 04/07/2017    Current Medications, Allergies, Past Medical History, Past Surgical History, Family History, and Social History were reviewed in [employer] record.   Review of Systems  All symptoms NEG except where BOLDED >>  Constitutional:  F/C/S, fatigue, anorexia, unexpected weight change. HEENT:  HA, visual changes, hearing loss, earache, nasal symptoms, sore throat, mouth sores, hoarseness. Resp:  cough, sputum, hemoptysis; SOB, tightness, wheezing. Cardio:  CP, palpit, DOE, orthopnea, edema. GI:  N/V/D/C, blood in stool; reflux, abd pain, distention, gas. GU:  dysuria, freq, urgency, hematuria, flank pain, voiding difficulty. MS:  joint pain, swelling, tenderness, decr ROM; neck pain, back pain, etc. Neuro:  HA, tremors, seizures, dizziness, syncope, weakness, numbness, gait abn. Skin:  suspicious lesions or skin rash. Heme:  adenopathy, bruising, bleeding. Psyche:  confusion, agitation, sleep disturbance, hallucinations, anxiety, depression suicidal.   Objective:   Physical Exam       Vital Signs:  Reviewed...   General:  WD, WN, 82 y/o WF in NAD; alert & oriented; pleasant & cooperative... HEENT:  [place]/AT; Conjunctiva- pink, Sclera- nonicteric, EOM-wnl, PERRLA, EACs-clear, TMs-wnl; NOSE-clear; THROAT-clear & wnl.  Neck:  Supple w/ fairROM; no JVD; normal carotid impulses w/o bruits; no thyromegaly or nodules  palpated; no lymphadenopathy.  Chest:  Decr BS bilat, few scat rhonchi otherw clear not congested no wheezing rales or signs of consolidation... Heart:  Regular Rhythm; norm S1 & S2 w/ Gr1/6 SEM w/o rubs or gallops detected... Abdomen:  Soft & nontender, +panniculus- no guarding or rebound; normal bowel sounds; no organomegaly or masses palpated. Ext:  decrROM; without deformities +arthritic changes; +venous insuffic, no edema;  Pulses intact w/o bruits. Neuro:  No focal neuro deficit; abn gait & balance poor, uses cane... Derm:  No lesions noted; no rash etc. Lymph:  No cervical, supraclavicular, axillary, or inguinal adenopathy palpated.   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    Cardiac> on Coreg6.25- 1/2Bid for ?HBP; she has mild AS & DiastCHF on 2DEcho and this needs follow up w/ her PCP vs Cards...    Endocrine> IDDM on Lantus & Novolog (A1c=6.3), HL on Crestor10 (FLP- controlled), Hypothyroid on Synthroid50, transiently elev serum calcium ?etiology (resolved spont), osteoporosis w/ mult fxs on Prolia Q59mo; managed by DrGherghe & last seen 06/09/15- note reviewed...    GI> She presented w/ symptoms of reflux/ LPR w/ cough while eating etc => needs GI eval & we will refer (see recs below)...     GYN> Hx ovarian cancer (in remission- details unknown), s/p hyst & Ca125=9 (08/2014)... She is followed by DrFernandez now...    Ortho/ Rheum> DJD w/ TKR, FM on Lyrica, Tramadol, Percocet,etc, severe LBP requires epidural shots; she is on a huge dose of Lyrica; PMR on Pred (details unknown); her Sed=52 and she saw [facility] for Rheum last 09/03/15- severe OA, FM, PMR, & osteoporosis on Lyrica, Pred, Percocet, & Prolia; she felt worse when out of her Lyrica    Neuro> on Aricept5 per DrCrawford, I wonder if she is taking any/  all of her meds properly, no care giver accompanies pt to office visits, she is ?stoic ?oblivious...    Heme> Hg 10-11 range May2016 after fall w/ fx nose & wrist; similar now & may need further anemia work-up as well, deferred to PCP...  PLAN >>   01/19/15>   We discussed Rx w/ ZPak for her URI (it could also help her if she has atyp-mycobacterium); we compromised rx w/ NEBS TID- followed by AdvairBid & Spiriva once daily, plus the MUCINEX1200mg Bid w/ fluids;  She again declines the added benefit from a vigorous chest PT regimen, flutter valve, postural drainage, purse lip breathing, etc... 06/23/15>   We again outlined for MrsNatter a vigorous home treatment regimen for her bronchiectasis & mucoid impactions including a TID regimen>  NEB w/ Albut vs Duoneb Tid, followed by her Advair500Bid & Spiriva once/d, Guaifenesin 400mg  tabs- 2Tid w/ fluids, take her low dose Pred in AM & Singulair in PM; she declines to do chest PT/ Flutter & postural drainage treatments... 10/01/15>   Yaris has a URI & mild COPD exac=> we will treat w/ Doxy100Bid (her request) and Medrol dosepak;  We again reviewed the recommended treatment protocol for her bronchiectasis (it was like she heard it for the 1st time)>  NEBS Tid, followed by [facility] & SPIRIVA daily, +Guaifenesin400-2Tid + Fluids, take the low dose Pred in AM (per Rheum) and the Singulair10 in PM, work to expectorate the phlegm but she declines to do  chest PT, Flutter, postural drainage, etc... we plan ROV recheck in 54mo... 12/25/15>   MsNatter has serious multisys dis & is managed by DrCrawford-PCP;  From the pulm standpoint she continues to downplay the severity of her lung dis & still refuses/ forgets/ etc to do her breathing treatments as we have outlined for her- REC to use NEB Tid, followed by Advair500Bid, Spiriva once dailyGFN400-2Tid w/ fluids, Singulair10, & Flutter valve/ postural drain/ purse-lip breathig treatments TID... Today we reviewed this regimen & added a Medrol Dosepak... She now appears to have signif GI-GERD/ reflux/ prob LPR & we added a vigorous antireflux regimen=> Prevacid30 taken 30 miin before dinner, NPO after dinner, elev HOB 6" blocks... She needs to see GI for further eval & needs these recommendations reinforced by them & her PCP. 04/13/16>   Genie has exercise induced hypoxemia & needs a POC w/ O2 at 2L/min w/ activity;  We will check an ONO in the interim to see if she needs nocturnal O2 as well;  In the meanwhile we once again reviewe4d w/ the pt regarding her MAX respiratory regimen-- Albut via NEB Tid, followed by Advair500Bid & Spiriva daily; continue GFN 400mg -2Tid w/ fluids; on Pred 2mg /d by rheum for PMR; on Singulair10 & flonase;  We also reviewed her antireflux regimen w/ Prevacid30 taken 30 min before dinner, NPO after dinner, elev HOB 6" 07/12/16>   Pt is asked to use O2 w/ ambulation & reminded again about the recommended pulm Rx- NEBS w/ Albut vs Duoneb Tid, ADVAIR500Bid, SPIRIVA daily, Guaifenesin 400mg -2Tid, Singulair10, Fluids, Flutter/ postural drainage/ purse-lip breathing exercises; call for any breathing issues, rov ~40mo 12/01/16>   She is reminded about her mandatory pulm regimen & this is again written for her to share w/ family/ care givers but she remains unconcerned, indifferent, not motivated 02/24/17>   Sent by [facility] for O2 recert... 05/18/17>    Neria has a COPD exac w/ yellow brown mucus and  WBC ct of 20K-- we deiced to treat w/ LEVAQUIN 750mg  one  daily x 7d, and give her a Depo shot & Medrol dosepak for the bronchial inflamm;  We are going to send in new rx for DUONEB to [place] ask her (again) to do this TID regularly followed by the Advair bid & Spiriva Qd;  She will continue the [facility] 1200mg  Bid, fluids, and use the IS Tid as part of her breathing treatments   Plan:     Patient's Medications  New Prescriptions   IPRATROPIUM-ALBUTEROL (DUONEB) 0.5-2.5 (3) MG/3ML SOLN    Take 3 mLs by nebulization 3 (three) times daily.   LEVOFLOXACIN (LEVAQUIN) 750 MG TABLET    Take 1 tablet (750 mg total) by mouth daily.   METHYLPREDNISOLONE (MEDROL DOSEPAK) 4 MG TBPK TABLET    6 day pak-take as directed  Previous Medications   ALBUTEROL (PROVENTIL HFA;VENTOLIN HFA) 108 (90 BASE) MCG/ACT INHALER    Inhale 2 puffs into the lungs every 6 (six) hours as needed for wheezing or shortness of breath.   APOAEQUORIN (PREVAGEN) 10 MG CAPS    Take 10 m by mouth daily.   BD PEN NEEDLE NANO U/F 32G X 4 MM MISC    USE 4 TIMES A DAY   CARVEDILOL (COREG) 6.25 MG TABLET    Take 3.125 mg by mouth 2 (two) times daily with a meal.    DENOSUMAB (PROLIA) 60 MG/ML SOLN INJECTION    Inject 60 mg into the skin every 6 (six) months. Administer in upper arm, thigh, or abdomen   DEXLANSOPRAZOLE 30 MG CAPSULE    Take 1 capsule (30 mg total) by mouth daily.   DEXTROMETHORPHAN-GUAIFENESIN (MUCINEX DM MAXIMUM STRENGTH) 60-1200 MG TB12    Take 1 tablet by mouth 2 (two) times daily as needed (for cough/phlegm). Reported on 06/23/2015   DONEPEZIL (ARICEPT) 5 MG TABLET    TAKE 1 TABLET (5 MG TOTAL) BY MOUTH AT BEDTIME.   DULOXETINE HCL (CYMBALTA PO)    Take 90 mg by mouth daily.   FLUTICASONE-SALMETEROL (ADVAIR DISKUS) 500-50 MCG/DOSE AEPB    Inhale 1 puff into the lungs 2 (two) times daily.   GLUCOSE BLOOD (FREESTYLE TEST STRIPS) TEST STRIP    Use to test blood sugar 3 times daily. Dx: E11.65   INSULIN GLARGINE (LANTUS  SOLOSTAR) 100 UNIT/ML SOLOSTAR PEN    INJECT 20 UNITS SUBCUTANEOUSLY EVERY MORNING   INSULIN LISPRO (HUMALOG KWIKPEN) 100 UNIT/ML KIWKPEN    Inject 4-6 units into the skin daily.   LANCETS (FREESTYLE) LANCETS    Use to test blood sugar 3 times daily. Dx: E11.65   LANTUS SOLOSTAR 100 UNIT/ML SOLOSTAR PEN    INJECT 30 UNITS SUBCUTANEOUSLY EVERY MORNING   LEVOTHYROXINE (SYNTHROID, LEVOTHROID) 50 MCG TABLET    Take 1 tablet (50 mcg total) by mouth daily before breakfast.   MIRABEGRON ER (MYRBETRIQ) 50 MG TB24 TABLET    Take 1 tablet (50 mg total) by mouth daily.   MONTELUKAST (SINGULAIR) 10 MG TABLET    TAKE 1 TABLET (10 MG TOTAL) BY MOUTH DAILY.   PREDNISONE 2 MG TBEC    Take 2 mg by mouth daily.   PREGABALIN (LYRICA) 100 MG CAPSULE    Take 1 capsule (100 mg total) by mouth 2 (two) times daily.   RANITIDINE (ZANTAC) 150 MG TABLET    Take 150 mg by mouth 2 (two) times daily as needed for heartburn.   ROSUVASTATIN (CRESTOR) 10 MG TABLET    Take 1 tablet (10 mg total) by mouth every evening.   TIOTROPIUM (  SPIRIVA HANDIHALER) 18 MCG INHALATION CAPSULE    Place 1 capsule (18 mcg total) into inhaler and inhale daily.   TRAMADOL (ULTRAM) 50 MG TABLET    Take 1 tablet (50 mg total) by mouth every 8 (eight) hours as needed.   TRIMETHOPRIM (TRIMPEX) 100 MG TABLET    Take 100 mg by mouth daily.   ZOLPIDEM (AMBIEN) 5 MG TABLET    Take 1 tablet (5 mg total) by mouth at bedtime.  Modified Medications   No medications on file  Discontinued Medications   ALBUTEROL (PROVENTIL) (2.5 MG/3ML) 0.083% NEBULIZER SOLUTION    Take 3 mLs (2.5 mg total) by nebulization daily.   DICLOFENAC SODIUM (VOLTAREN) 1 % GEL    Apply 2 g topically 4 (four) times daily.   DULOXETINE (CYMBALTA) 60 MG CAPSULE    Take 60 mg by mouth daily.   FLUTICASONE (FLONASE) 50 MCG/ACT NASAL SPRAY    Place 1-2 sprays into both nostrils daily as needed for allergies or rhinitis.   PREDNISONE (DELTASONE) 1 MG TABLET    Take 2 mg by mouth daily with  breakfast.

## 2017-05-19 ENCOUNTER — Other Ambulatory Visit: Payer: Self-pay | Admitting: Family Medicine

## 2017-05-19 ENCOUNTER — Telehealth: Payer: Self-pay | Admitting: Pulmonary Disease

## 2017-05-19 ENCOUNTER — Ambulatory Visit
Admission: RE | Admit: 2017-05-19 | Discharge: 2017-05-19 | Disposition: A | Discharge status: Home / Self Care | Payer: Medicare Other | Source: Ambulatory Visit | Attending: Family Medicine | Admitting: Family Medicine

## 2017-05-19 DIAGNOSIS — M25551 Pain in right hip: Secondary | ICD-10-CM | POA: Diagnosis not present

## 2017-05-19 DIAGNOSIS — M353 Polymyalgia rheumatica: Secondary | ICD-10-CM | POA: Diagnosis not present

## 2017-05-19 DIAGNOSIS — I110 Hypertensive heart disease with heart failure: Secondary | ICD-10-CM | POA: Diagnosis not present

## 2017-05-19 DIAGNOSIS — I5032 Chronic diastolic (congestive) heart failure: Secondary | ICD-10-CM | POA: Diagnosis not present

## 2017-05-19 DIAGNOSIS — S51812D Laceration without foreign body of left forearm, subsequent encounter: Secondary | ICD-10-CM | POA: Diagnosis not present

## 2017-05-19 DIAGNOSIS — S32511A Fracture of superior rim of right pubis, initial encounter for closed fracture: Secondary | ICD-10-CM | POA: Diagnosis not present

## 2017-05-19 DIAGNOSIS — E1165 Type 2 diabetes mellitus with hyperglycemia: Secondary | ICD-10-CM | POA: Diagnosis not present

## 2017-05-19 DIAGNOSIS — M797 Fibromyalgia: Secondary | ICD-10-CM | POA: Diagnosis not present

## 2017-05-19 NOTE — Telephone Encounter (Signed)
SN---  Jonelle Sidle from [facility] needs you to sign off of the OV from 2/27 so they can get her order filled that was sent to them for the duoneb.  Thanks

## 2017-05-20 ENCOUNTER — Telehealth: Payer: Self-pay | Admitting: Pulmonary Disease

## 2017-05-20 DIAGNOSIS — I110 Hypertensive heart disease with heart failure: Secondary | ICD-10-CM | POA: Diagnosis not present

## 2017-05-20 DIAGNOSIS — M353 Polymyalgia rheumatica: Secondary | ICD-10-CM | POA: Diagnosis not present

## 2017-05-20 DIAGNOSIS — E1165 Type 2 diabetes mellitus with hyperglycemia: Secondary | ICD-10-CM | POA: Diagnosis not present

## 2017-05-20 DIAGNOSIS — M797 Fibromyalgia: Secondary | ICD-10-CM | POA: Diagnosis not present

## 2017-05-20 DIAGNOSIS — I5032 Chronic diastolic (congestive) heart failure: Secondary | ICD-10-CM | POA: Diagnosis not present

## 2017-05-20 DIAGNOSIS — S51812D Laceration without foreign body of left forearm, subsequent encounter: Secondary | ICD-10-CM | POA: Diagnosis not present

## 2017-05-20 NOTE — Telephone Encounter (Signed)
Patient's daughter, Monico Hoar calling about order for Duoneb.  CB is [phone].

## 2017-05-20 NOTE — Telephone Encounter (Signed)
Called and spoke with pt letting her know we are waiting for Dr. Lenna Gilford to sign off on note and at that point we can fill duoneb for pt.  Called [facility] and spoke with Estill Bamberg who stated that the pt's office visit has to be signed before they can have pharmacy ship pt's neb sol.  Stated to [place] that Dr. Lenna Gilford is working on note and will have it signed today. Estill Bamberg expressed understanding and stated once visit is signed, they will ship pt's meds.

## 2017-05-20 NOTE — Telephone Encounter (Signed)
Spoke with Langley Gauss verifying the current meds pt was put on based on pt's last OV with Dr. Lenna Gilford.  Denise expressed understanding. Nothing further needed at this current time.

## 2017-05-20 NOTE — Telephone Encounter (Signed)
Routing this note to [name] to keep an eye out for Dr. Lenna Gilford to sign off on note.

## 2017-05-23 ENCOUNTER — Telehealth: Payer: Self-pay | Admitting: Internal Medicine

## 2017-05-23 DIAGNOSIS — I5032 Chronic diastolic (congestive) heart failure: Secondary | ICD-10-CM | POA: Diagnosis not present

## 2017-05-23 DIAGNOSIS — E1165 Type 2 diabetes mellitus with hyperglycemia: Secondary | ICD-10-CM | POA: Diagnosis not present

## 2017-05-23 DIAGNOSIS — M797 Fibromyalgia: Secondary | ICD-10-CM | POA: Diagnosis not present

## 2017-05-23 DIAGNOSIS — S51812D Laceration without foreign body of left forearm, subsequent encounter: Secondary | ICD-10-CM | POA: Diagnosis not present

## 2017-05-23 DIAGNOSIS — I110 Hypertensive heart disease with heart failure: Secondary | ICD-10-CM | POA: Diagnosis not present

## 2017-05-23 DIAGNOSIS — M353 Polymyalgia rheumatica: Secondary | ICD-10-CM | POA: Diagnosis not present

## 2017-05-23 NOTE — Telephone Encounter (Signed)
After looking through chart it appears it may have been Pulmonary regarding her labs. Called patient and transferred to Pulmonary.

## 2017-05-23 NOTE — Telephone Encounter (Signed)
Copied from [place] [phone]. Topic: Quick Communication - Office Called Patient >> May 23, 2017 11:03 AM Robina Ade, Helene Kelp D wrote: Reason for CRM: Patient called and said that she had a missed call from the office. I could not fine the reason of the call. Patient said there was no vm left. Please call patient back, thanks.

## 2017-05-24 DIAGNOSIS — M353 Polymyalgia rheumatica: Secondary | ICD-10-CM | POA: Diagnosis not present

## 2017-05-24 DIAGNOSIS — I110 Hypertensive heart disease with heart failure: Secondary | ICD-10-CM | POA: Diagnosis not present

## 2017-05-24 DIAGNOSIS — I5032 Chronic diastolic (congestive) heart failure: Secondary | ICD-10-CM | POA: Diagnosis not present

## 2017-05-24 DIAGNOSIS — M797 Fibromyalgia: Secondary | ICD-10-CM | POA: Diagnosis not present

## 2017-05-24 DIAGNOSIS — S51812D Laceration without foreign body of left forearm, subsequent encounter: Secondary | ICD-10-CM | POA: Diagnosis not present

## 2017-05-24 DIAGNOSIS — E1165 Type 2 diabetes mellitus with hyperglycemia: Secondary | ICD-10-CM | POA: Diagnosis not present

## 2017-05-25 ENCOUNTER — Encounter (HOSPITAL_BASED_OUTPATIENT_CLINIC_OR_DEPARTMENT_OTHER): Payer: Medicare Other

## 2017-05-25 DIAGNOSIS — I110 Hypertensive heart disease with heart failure: Secondary | ICD-10-CM | POA: Diagnosis not present

## 2017-05-25 DIAGNOSIS — S51812D Laceration without foreign body of left forearm, subsequent encounter: Secondary | ICD-10-CM | POA: Diagnosis not present

## 2017-05-25 DIAGNOSIS — E1165 Type 2 diabetes mellitus with hyperglycemia: Secondary | ICD-10-CM | POA: Diagnosis not present

## 2017-05-25 DIAGNOSIS — M353 Polymyalgia rheumatica: Secondary | ICD-10-CM | POA: Diagnosis not present

## 2017-05-25 DIAGNOSIS — I5032 Chronic diastolic (congestive) heart failure: Secondary | ICD-10-CM | POA: Diagnosis not present

## 2017-05-25 DIAGNOSIS — M797 Fibromyalgia: Secondary | ICD-10-CM | POA: Diagnosis not present

## 2017-05-26 DIAGNOSIS — E1165 Type 2 diabetes mellitus with hyperglycemia: Secondary | ICD-10-CM | POA: Diagnosis not present

## 2017-05-26 DIAGNOSIS — M797 Fibromyalgia: Secondary | ICD-10-CM | POA: Diagnosis not present

## 2017-05-26 DIAGNOSIS — I110 Hypertensive heart disease with heart failure: Secondary | ICD-10-CM | POA: Diagnosis not present

## 2017-05-26 DIAGNOSIS — M353 Polymyalgia rheumatica: Secondary | ICD-10-CM | POA: Diagnosis not present

## 2017-05-26 DIAGNOSIS — S51812D Laceration without foreign body of left forearm, subsequent encounter: Secondary | ICD-10-CM | POA: Diagnosis not present

## 2017-05-26 DIAGNOSIS — I5032 Chronic diastolic (congestive) heart failure: Secondary | ICD-10-CM | POA: Diagnosis not present

## 2017-05-27 DIAGNOSIS — E1165 Type 2 diabetes mellitus with hyperglycemia: Secondary | ICD-10-CM | POA: Diagnosis not present

## 2017-05-27 DIAGNOSIS — S51812D Laceration without foreign body of left forearm, subsequent encounter: Secondary | ICD-10-CM | POA: Diagnosis not present

## 2017-05-27 DIAGNOSIS — I5032 Chronic diastolic (congestive) heart failure: Secondary | ICD-10-CM | POA: Diagnosis not present

## 2017-05-27 DIAGNOSIS — I110 Hypertensive heart disease with heart failure: Secondary | ICD-10-CM | POA: Diagnosis not present

## 2017-05-27 DIAGNOSIS — M353 Polymyalgia rheumatica: Secondary | ICD-10-CM | POA: Diagnosis not present

## 2017-05-27 DIAGNOSIS — M797 Fibromyalgia: Secondary | ICD-10-CM | POA: Diagnosis not present

## 2017-05-30 DIAGNOSIS — I110 Hypertensive heart disease with heart failure: Secondary | ICD-10-CM | POA: Diagnosis not present

## 2017-05-30 DIAGNOSIS — M353 Polymyalgia rheumatica: Secondary | ICD-10-CM | POA: Diagnosis not present

## 2017-05-30 DIAGNOSIS — I5032 Chronic diastolic (congestive) heart failure: Secondary | ICD-10-CM | POA: Diagnosis not present

## 2017-05-30 DIAGNOSIS — E1165 Type 2 diabetes mellitus with hyperglycemia: Secondary | ICD-10-CM | POA: Diagnosis not present

## 2017-05-30 DIAGNOSIS — M797 Fibromyalgia: Secondary | ICD-10-CM | POA: Diagnosis not present

## 2017-05-30 DIAGNOSIS — S51812D Laceration without foreign body of left forearm, subsequent encounter: Secondary | ICD-10-CM | POA: Diagnosis not present

## 2017-05-31 ENCOUNTER — Encounter (HOSPITAL_BASED_OUTPATIENT_CLINIC_OR_DEPARTMENT_OTHER): Payer: Medicare Other | Attending: Internal Medicine

## 2017-05-31 DIAGNOSIS — M069 Rheumatoid arthritis, unspecified: Secondary | ICD-10-CM | POA: Diagnosis not present

## 2017-05-31 DIAGNOSIS — E119 Type 2 diabetes mellitus without complications: Secondary | ICD-10-CM | POA: Insufficient documentation

## 2017-05-31 DIAGNOSIS — I509 Heart failure, unspecified: Secondary | ICD-10-CM | POA: Diagnosis not present

## 2017-05-31 DIAGNOSIS — Z7952 Long term (current) use of systemic steroids: Secondary | ICD-10-CM | POA: Diagnosis not present

## 2017-05-31 DIAGNOSIS — Z9221 Personal history of antineoplastic chemotherapy: Secondary | ICD-10-CM | POA: Insufficient documentation

## 2017-05-31 DIAGNOSIS — T8131XA Disruption of external operation (surgical) wound, not elsewhere classified, initial encounter: Secondary | ICD-10-CM | POA: Diagnosis not present

## 2017-05-31 DIAGNOSIS — J449 Chronic obstructive pulmonary disease, unspecified: Secondary | ICD-10-CM | POA: Insufficient documentation

## 2017-05-31 DIAGNOSIS — Y838 Other surgical procedures as the cause of abnormal reaction of the patient, or of later complication, without mention of misadventure at the time of the procedure: Secondary | ICD-10-CM | POA: Diagnosis not present

## 2017-05-31 DIAGNOSIS — S51012A Laceration without foreign body of left elbow, initial encounter: Secondary | ICD-10-CM | POA: Diagnosis not present

## 2017-06-01 DIAGNOSIS — I5032 Chronic diastolic (congestive) heart failure: Secondary | ICD-10-CM | POA: Diagnosis not present

## 2017-06-01 DIAGNOSIS — S51812D Laceration without foreign body of left forearm, subsequent encounter: Secondary | ICD-10-CM | POA: Diagnosis not present

## 2017-06-01 DIAGNOSIS — M797 Fibromyalgia: Secondary | ICD-10-CM | POA: Diagnosis not present

## 2017-06-01 DIAGNOSIS — M353 Polymyalgia rheumatica: Secondary | ICD-10-CM | POA: Diagnosis not present

## 2017-06-01 DIAGNOSIS — E1165 Type 2 diabetes mellitus with hyperglycemia: Secondary | ICD-10-CM | POA: Diagnosis not present

## 2017-06-01 DIAGNOSIS — I110 Hypertensive heart disease with heart failure: Secondary | ICD-10-CM | POA: Diagnosis not present

## 2017-06-02 DIAGNOSIS — E1165 Type 2 diabetes mellitus with hyperglycemia: Secondary | ICD-10-CM | POA: Diagnosis not present

## 2017-06-02 DIAGNOSIS — S51812D Laceration without foreign body of left forearm, subsequent encounter: Secondary | ICD-10-CM | POA: Diagnosis not present

## 2017-06-02 DIAGNOSIS — I5032 Chronic diastolic (congestive) heart failure: Secondary | ICD-10-CM | POA: Diagnosis not present

## 2017-06-02 DIAGNOSIS — I110 Hypertensive heart disease with heart failure: Secondary | ICD-10-CM | POA: Diagnosis not present

## 2017-06-02 DIAGNOSIS — M797 Fibromyalgia: Secondary | ICD-10-CM | POA: Diagnosis not present

## 2017-06-02 DIAGNOSIS — M353 Polymyalgia rheumatica: Secondary | ICD-10-CM | POA: Diagnosis not present

## 2017-06-03 DIAGNOSIS — I5032 Chronic diastolic (congestive) heart failure: Secondary | ICD-10-CM | POA: Diagnosis not present

## 2017-06-03 DIAGNOSIS — M797 Fibromyalgia: Secondary | ICD-10-CM | POA: Diagnosis not present

## 2017-06-03 DIAGNOSIS — E1165 Type 2 diabetes mellitus with hyperglycemia: Secondary | ICD-10-CM | POA: Diagnosis not present

## 2017-06-03 DIAGNOSIS — S51812D Laceration without foreign body of left forearm, subsequent encounter: Secondary | ICD-10-CM | POA: Diagnosis not present

## 2017-06-03 DIAGNOSIS — I110 Hypertensive heart disease with heart failure: Secondary | ICD-10-CM | POA: Diagnosis not present

## 2017-06-03 DIAGNOSIS — M353 Polymyalgia rheumatica: Secondary | ICD-10-CM | POA: Diagnosis not present

## 2017-06-06 ENCOUNTER — Other Ambulatory Visit: Payer: Self-pay | Admitting: Pulmonary Disease

## 2017-06-06 DIAGNOSIS — E1165 Type 2 diabetes mellitus with hyperglycemia: Secondary | ICD-10-CM | POA: Diagnosis not present

## 2017-06-06 DIAGNOSIS — M353 Polymyalgia rheumatica: Secondary | ICD-10-CM | POA: Diagnosis not present

## 2017-06-06 DIAGNOSIS — I5032 Chronic diastolic (congestive) heart failure: Secondary | ICD-10-CM | POA: Diagnosis not present

## 2017-06-06 DIAGNOSIS — M797 Fibromyalgia: Secondary | ICD-10-CM | POA: Diagnosis not present

## 2017-06-06 DIAGNOSIS — I110 Hypertensive heart disease with heart failure: Secondary | ICD-10-CM | POA: Diagnosis not present

## 2017-06-06 DIAGNOSIS — S51812D Laceration without foreign body of left forearm, subsequent encounter: Secondary | ICD-10-CM | POA: Diagnosis not present

## 2017-06-07 DIAGNOSIS — M353 Polymyalgia rheumatica: Secondary | ICD-10-CM | POA: Diagnosis not present

## 2017-06-07 DIAGNOSIS — I110 Hypertensive heart disease with heart failure: Secondary | ICD-10-CM | POA: Diagnosis not present

## 2017-06-07 DIAGNOSIS — I5032 Chronic diastolic (congestive) heart failure: Secondary | ICD-10-CM | POA: Diagnosis not present

## 2017-06-07 DIAGNOSIS — E1165 Type 2 diabetes mellitus with hyperglycemia: Secondary | ICD-10-CM | POA: Diagnosis not present

## 2017-06-07 DIAGNOSIS — S51812D Laceration without foreign body of left forearm, subsequent encounter: Secondary | ICD-10-CM | POA: Diagnosis not present

## 2017-06-07 DIAGNOSIS — M797 Fibromyalgia: Secondary | ICD-10-CM | POA: Diagnosis not present

## 2017-06-08 DIAGNOSIS — M353 Polymyalgia rheumatica: Secondary | ICD-10-CM | POA: Diagnosis not present

## 2017-06-08 DIAGNOSIS — S51812D Laceration without foreign body of left forearm, subsequent encounter: Secondary | ICD-10-CM | POA: Diagnosis not present

## 2017-06-08 DIAGNOSIS — M797 Fibromyalgia: Secondary | ICD-10-CM | POA: Diagnosis not present

## 2017-06-08 DIAGNOSIS — I5032 Chronic diastolic (congestive) heart failure: Secondary | ICD-10-CM | POA: Diagnosis not present

## 2017-06-08 DIAGNOSIS — E1165 Type 2 diabetes mellitus with hyperglycemia: Secondary | ICD-10-CM | POA: Diagnosis not present

## 2017-06-08 DIAGNOSIS — I110 Hypertensive heart disease with heart failure: Secondary | ICD-10-CM | POA: Diagnosis not present

## 2017-06-09 DIAGNOSIS — M797 Fibromyalgia: Secondary | ICD-10-CM | POA: Diagnosis not present

## 2017-06-09 DIAGNOSIS — S51812D Laceration without foreign body of left forearm, subsequent encounter: Secondary | ICD-10-CM | POA: Diagnosis not present

## 2017-06-09 DIAGNOSIS — I5032 Chronic diastolic (congestive) heart failure: Secondary | ICD-10-CM | POA: Diagnosis not present

## 2017-06-09 DIAGNOSIS — M353 Polymyalgia rheumatica: Secondary | ICD-10-CM | POA: Diagnosis not present

## 2017-06-09 DIAGNOSIS — I110 Hypertensive heart disease with heart failure: Secondary | ICD-10-CM | POA: Diagnosis not present

## 2017-06-09 DIAGNOSIS — E1165 Type 2 diabetes mellitus with hyperglycemia: Secondary | ICD-10-CM | POA: Diagnosis not present

## 2017-06-10 DIAGNOSIS — I5032 Chronic diastolic (congestive) heart failure: Secondary | ICD-10-CM | POA: Diagnosis not present

## 2017-06-10 DIAGNOSIS — M797 Fibromyalgia: Secondary | ICD-10-CM | POA: Diagnosis not present

## 2017-06-10 DIAGNOSIS — E1165 Type 2 diabetes mellitus with hyperglycemia: Secondary | ICD-10-CM | POA: Diagnosis not present

## 2017-06-10 DIAGNOSIS — I110 Hypertensive heart disease with heart failure: Secondary | ICD-10-CM | POA: Diagnosis not present

## 2017-06-10 DIAGNOSIS — S51812D Laceration without foreign body of left forearm, subsequent encounter: Secondary | ICD-10-CM | POA: Diagnosis not present

## 2017-06-10 DIAGNOSIS — M353 Polymyalgia rheumatica: Secondary | ICD-10-CM | POA: Diagnosis not present

## 2017-06-12 ENCOUNTER — Other Ambulatory Visit: Payer: Self-pay | Admitting: Pulmonary Disease

## 2017-06-13 DIAGNOSIS — M353 Polymyalgia rheumatica: Secondary | ICD-10-CM | POA: Diagnosis not present

## 2017-06-13 DIAGNOSIS — S51812D Laceration without foreign body of left forearm, subsequent encounter: Secondary | ICD-10-CM | POA: Diagnosis not present

## 2017-06-13 DIAGNOSIS — M797 Fibromyalgia: Secondary | ICD-10-CM | POA: Diagnosis not present

## 2017-06-13 DIAGNOSIS — I5032 Chronic diastolic (congestive) heart failure: Secondary | ICD-10-CM | POA: Diagnosis not present

## 2017-06-13 DIAGNOSIS — E1165 Type 2 diabetes mellitus with hyperglycemia: Secondary | ICD-10-CM | POA: Diagnosis not present

## 2017-06-13 DIAGNOSIS — I110 Hypertensive heart disease with heart failure: Secondary | ICD-10-CM | POA: Diagnosis not present

## 2017-06-14 ENCOUNTER — Ambulatory Visit (INDEPENDENT_AMBULATORY_CARE_PROVIDER_SITE_OTHER): Payer: Medicare Other | Admitting: Pulmonary Disease

## 2017-06-14 ENCOUNTER — Encounter: Payer: Self-pay | Admitting: Pulmonary Disease

## 2017-06-14 VITALS — BP 117/72 | HR 81 | Temp 98.0°F | Ht 60.0 in | Wt 137.6 lb

## 2017-06-14 DIAGNOSIS — I5032 Chronic diastolic (congestive) heart failure: Secondary | ICD-10-CM

## 2017-06-14 DIAGNOSIS — E119 Type 2 diabetes mellitus without complications: Secondary | ICD-10-CM | POA: Diagnosis not present

## 2017-06-14 DIAGNOSIS — J398 Other specified diseases of upper respiratory tract: Secondary | ICD-10-CM | POA: Diagnosis not present

## 2017-06-14 DIAGNOSIS — J479 Bronchiectasis, uncomplicated: Secondary | ICD-10-CM | POA: Diagnosis not present

## 2017-06-14 DIAGNOSIS — Z9221 Personal history of antineoplastic chemotherapy: Secondary | ICD-10-CM | POA: Diagnosis not present

## 2017-06-14 DIAGNOSIS — R0902 Hypoxemia: Secondary | ICD-10-CM

## 2017-06-14 DIAGNOSIS — I10 Essential (primary) hypertension: Secondary | ICD-10-CM | POA: Diagnosis not present

## 2017-06-14 DIAGNOSIS — S51012A Laceration without foreign body of left elbow, initial encounter: Secondary | ICD-10-CM | POA: Diagnosis not present

## 2017-06-14 DIAGNOSIS — J8410 Pulmonary fibrosis, unspecified: Secondary | ICD-10-CM | POA: Insufficient documentation

## 2017-06-14 DIAGNOSIS — J449 Chronic obstructive pulmonary disease, unspecified: Secondary | ICD-10-CM | POA: Diagnosis not present

## 2017-06-14 DIAGNOSIS — T8131XA Disruption of external operation (surgical) wound, not elsewhere classified, initial encounter: Secondary | ICD-10-CM | POA: Diagnosis not present

## 2017-06-14 DIAGNOSIS — M069 Rheumatoid arthritis, unspecified: Secondary | ICD-10-CM | POA: Diagnosis not present

## 2017-06-14 DIAGNOSIS — I509 Heart failure, unspecified: Secondary | ICD-10-CM | POA: Diagnosis not present

## 2017-06-14 NOTE — Progress Notes (Signed)
Subjective:     Patient ID: Kayla Thompson, female   DOB: May 05, 1935, 82 y.o.   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  ~  January 07, 2015:  Initial pulmonary consult w/ SN>      9 y/o WF, referred by Kayla Thompson) for a pulmonary evaluation due to cough & dyspnea>  Kayla Thompson moved to [place] about 55mo ago to be near her daughter, granddaughter, and great-grands after her husb passed away;  Shortly after arrival she fell & was [facility] w/ fx nasal bones and left wrist (distal radius & ulna) managed by Kayla Thompson summary reviewed... She established Primary Care w/ Kayla Thompson, [place], Endocrine- Kayla Thompson... She has a Hx of pulmonary problems identified by the Pt & records from [facility] as Bronchiectasis and Asthma;  Her CC is SOB/ DOE w/ min activity & it is apparent that she is rather sedentary and a poor historian; she notes mild cough, some yellow sputum, no hemoptysis; she denies CP, palpit, or signif cardiac history...  Smoking Hx>  She is a never smoker, but had signif 2nd hand exposure from her husb...  Pulmonary Hx>  She indicates hx left lung surg 2000 for benign granuloma; hx bronchiectasis but she does not know how the Dx was established; told asthma as well ever since early 2000's but not as a child or young adult; states she had pneumonia age 52 at same time as the measles (not hosp & made full recovery w/o apparent residual resp problems); she does admit to recurrent bronchitic infections as adult requiring antibiotics & occas Pred rx... She adds a rambling hx of work-ups at [facility] (in [place] in [place] but she does not know what this was for?       Notes from Kayla Thompson in [place]> Bronchiectasis, mult pulm nodules, & mild Asthma; prev pulm nodule resected in 2000 which proved to be a granuloma; he reported that an atypical  mycobacterium was found at the time of lung bx in the past- no further details provided; seen for cough w/ yellow sput & incr dyspnea- reported to be stable on Advair500Bid, Spiriva daily, Singulair10, NEB w/ Albut-Bid, Mucinex, VentolinHFA rescue prn; she was also on Pred 3mg /d for PMR from her Rheum;   Medical Hx>  Diastolic Dysfunction on 2DEcho, mild AS, HL, IDDM, Hypothyroid, GERD, DJD w/ TKR and ankle surg; Fibromyalgia on Lyrica/ Percocet/ Tramadol; PMR per Rheum in [place] on Pred2mg /d x 45yr (it was last dropped from 3mg  ~16yr ago); Osteoporosis; Anemia; Anxiety.   Family Hx>  Mother died from lung cancer (as did one of her sisters);  Father passed from heart disease.  Occup Hx>  No known occupational exposures  Current Meds>  SEE FULL MED LIST BELOW EXAM showed Afeb, VSS, O2sat=99% on RA;  Heent- neg, mallampati3;  Chest- decr BS bilat, not congested, w/o w/r/r heard;  Heart- RR gr1/6SEM w/o r/g detected;  Abd- soft, nontender, +panniculus;  Ext- VI w/o c/c/e...   CT Chest 11/17/09 in [place]> mult noncalcif pulm nodules bilat (stable when compared to older scans), mucoid impaction LUL & RML, pleuroparenchymal scarring right base (unchanged), no adenopathy, norm heart size & coronary calcif.  PFTs 02/04/10 in [place]> FVC=2.20 (95%), FEV1=1.42 (88%), %1sec=65, mid-flows reduced at 38% predicted; no improvement in FEV1 after bronchodil; TLC=4.70 (118%), RV=2.48 (153%), RV/TLC=53%; DLCO=54% predicted.  CXR 01/14/14 reported to show norm heart  size, clear lungs, thoracic spondylosis, NAD...   CXR 08/02/14 in ER here after fall showed norm heart size, diffuse interstitial coarsening w/o focal airsp dis...   2DEcho 08/02/14 in [place] after fall showed norm LVF w/ EF=55-60%, no regional wall motion abn, Gr1 DD, mild AS w/ mean gradient 48mmHg; mod calcif MV leaflets w/o stenosis or regurg; RV looked OK but could not est PAsys...  CXR here 01/07/15 showed norm heart size, incr AP diameter of chest & flat  diaphragms, incr markings diffusely, old right rib fxs, osteopenia...   Spirometry 01/07/15> we attempted mult trial of simple spirometry but pt could not generate an acceptable curve & results are not reliable...  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 We will follow closely.    Cardiac> on Coreg6.25Bid for ?HBP; she has mild AS & DiastCHF on 2DEcho and this needs follow up...    Endocrine> IDDM, Hypothyroid, transiently elev serum calcium ?etiology (resolved spont); managed by Kayla Thompson; BS today is 45 & asked to eat & check w/ endocrine re- adjustment in Novolog...    GYN> Hx ovarian cancer (in remission- details unknown), s/p hyst    Ortho/ Rheum> DJD w/ TKR, FM on Lyrica etc, severe LBP requires epidural shots; PMR on Pred (details unknown); her Sed=52 and she will prob need Rheum to help sort this out...    Heme> Hg 10-11 range May2016 after fall w/ fx nose & wrist; similar now & may need further anemia work-up as well...  ADDENDUM>>  Hi-res CT Chest 01/14/15:  Norm heart size, atherosclerosis of Ao/ great vessels/ coronaries, no adenopathy, atrophic thyroid; ?small sliding pulm hernia betw the lat 7th & 8th ribs w/ scarring; extensive bronchiectasis bilat w/ mucoid impactions within dilated airways (infectious/inflamm bronchiolitis- r/o MAI), 1mm LLL  nodule, tracheobronchomalacia w/ extensive bilat air trapping on expiration, smallHH, old healed right 6th-7th rib fx, DJD Tspine...  REC> continue NEBS, Advair, Spiriva, Mucinex (maximize to 600mg  Qid w/ fluids), practice purse lip breathing to splint the airways open & aide in expectoration of phlegm, use FLUTTER valve & postural drainage to get the sput out; collect sput for routine C&S, AFB, Fungi => TC w/ pt: she has a lot going on, refuses further meds, refuses further eval, can't be bothered w/ learning Chest PT/ Flutter, postural drainage, purse lip breathing, collecting sputum specimen, etc; states she's had all this a long time & not interested in more aggressive pulmonary treatments at this time... She will f/u w/ me in November & we will discuss again.  ADDENDUM>>  Records from Kayla Thompson, Pulmonary in [place]=> note of 07/18/13:  Hx mild asthma & bronchiectasis, hx mult pulm nodules followed for several yrs & one removed surgically in 2000 that proved to be a granuloma & report of an atypical mycobacterium being found at the same time (but no details given), on NEB w/ Albuterol,  Advair, Spiriva, Singulair, Mucinex, low-dose Pred for PMR;  She was given all indicated vaccinations;  No change in her med regimen for yrs...   ~  February 19, 2015:  6wk ROV w/ SN>  SEE ABOVE-- pt notes nasal congestion, chest congestion, sl cough & incr SOB for the last wk or so; in the next breath she says "I seldom get a cold", thinks it's all allergies, notes great-grnads recently ill;  She has NEBS w/ Albut (only using once per day), Advair500Bid, Spiriva once daily, Mucinex (?how much), Pred?1mg - 3/d, Singulair10/ Zyrtek10...  Since she was here last she has followed up w/ PCP-DrCrawford 10/25> FM doing ok on Lyrica & she reported more functional & resting better...   She also had a f/u w/ Endocrine-Kayla Thompson 11/8> DM, osteoporosis, hypothyroid, & Hx hyperpara; Labs reviewed & ok (A1c=6.5 on her insulin,  Ca is wnl and vitD=66)...    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     Cardiac> on Coreg6.25Bid for ?HBP; she has mild AS & DiastCHF on 2DEcho and this needs follow up w/ her PCP vs Cards...    Endocrine> IDDM, Hypothyroid, transiently elev serum calcium ?etiology (resolved spont); managed by Kayla Thompson...    GYN> Hx ovarian cancer (in remission- details unknown), s/p hyst & Ca125=9 (08/2014)...    Ortho/ Rheum> DJD w/ TKR, FM on Lyrica, Tramadol, Percocet,etc, severe LBP requires epidural shots; PMR on Pred (details unknown); her Sed=52 and she will prob need Rheum to help sort this out...    Heme> Hg 10-11 range May2016 after fall w/ fx nose & wrist; similar now & may need further anemia work-up as well... EXAM showed Afeb, VSS, O2sat=93% on RA;  Wt=162#;  Heent- neg, mallampati3;  Chest- decr BS bilat, not congested, w/o w/r/r heard;  Heart- RR gr1/6SEM w/o r/g detected;  Abd- soft, nontender, +panniculus;  Ext- VI w/o c/c/e...   Pt given a neb treatment in office- unable to produce any sput for cultures... IMP/PLAN>>  We discussed Rx w/ ZPak for her URI (it could also help her if she has atyp-mycobacterium); we compromised rx w/ NEBS TID- followed by AdvairBid & Spiriva once daily, plus the MUCINEX1200mg Bid w/ fluids;  She again declines the added benefit from a vigorous chest PT regimen, flutter valve, postural drainage, purse lip breathing, etc... we plan a recheck in 3 months...  ~  June 23, 2015:  75mo ROV  w/ SN>  Kayla Thompson returns unaccompanied and states that her breathing is good- no problem she says; notes min cough, sm amt whitish sput (no color, no blood), admits to mild SOB/DOE but swears there is no progression, and denies CP/ f/c/s/ etc... She states that she was INTOL to ZPak given last OV but can't recall the reaction ("?didn't work for me"), doesn't want it again;  We reviewed her meds & again she is not being aggressive enough to effectively treat her bronchiectasis & mucoid impactions> eg. Only using the Albut NEBS prn & ave once Qod, only using the Mucinex 1200 once daily, she says she is using the ADVAIR500Bid, SPIRIVA once daily, SINGULAIR10, PRED1mg -2tabs  Qam ?per PCP/ ?Rheum... 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    Cardiac> on Coreg6.25- 1/2Bid for ?HBP; she has mild AS & DiastCHF on 2DEcho and this needs follow up w/ her PCP vs Cards...    Endocrine> IDDM on Lantus & Novolog (A1c=6.3), HL on Crestor10 (FLP- controlled), Hypothyroid on Synthroid50, transiently elev serum calcium ?etiology (resolved spont), osteoporosis w/ mult fxs on Prolia Q55mo; managed by Kayla Thompson & last seen 06/09/15- note reviewed...    GYN> Hx ovarian cancer (in remission- details unknown), s/p hyst & Ca125=9 (08/2014)...    Ortho/ Rheum> DJD w/ TKR, FM on Lyrica,  Tramadol, Percocet,etc, severe LBP requires epidural shots; she is on a huge dose of Lyrica; PMR on Pred (details unknown); her Sed=52 and she will prob need Rheum to help sort this out...    Neuro> on Aricept5 per DrCrawford, I wonder if she is taking any/ all of her meds properly, no care giver accompanies pt to office visits, she is ?stoic ?oblivious...    Heme> Hg 10-11 range May2016 after fall w/ fx nose & wrist; similar now & may need further anemia work-up as well, deferred to PCP... EXAM showed Afeb, VSS, O2sat=98% on RA;  Wt=161#;  Heent- neg, mallampati3;  Chest- decr BS bilat, not congested, w/o w/r/r heard;  Heart- RR gr1/6SEM w/o r/g detected;  Abd- soft, nontender, +panniculus;  Ext- VI w/o c/c/e...   LABS 2016:  Reviewed... IMP/PLAN>>  We again outlined for Kayla Thompson a vigorous home treatment regimen for her bronchiectasis & mucoid impactions including a TID regimen>  NEB w/ Albut vs Duoneb Tid, followed by her Advair500Bid & Spiriva once/d, Guaifenesin 400mg  tabs- 2Tid w/ fluids, take her low dose Pred in AM & Singulair in PM; she declines to do chest PT/ Flutter & postural drainage treatments...  ~  October 01, 2015:  42mo ROV & add-on appt requested by pt for COPD exac>  She reports that she went to a granddaughter's wedding in [place] 6/24, then spent 1wk in [place] and "caught a cold", didn't go to UC or see a physician, just "toughed it out", getting better slowly but symptoms lingering she says> head is woozy, nasal drainage, sl cough, white sput (no discoloration or blood), sl tight/ wheezy/ congested but no CP/ f/c/s, etc... She reminds me that "ZPak doesn't work for me", and she wants Doxy;  It is apparent that she is still NOT doing her Resp treatment regimen as I have prev requested & outlined for her (see below)- acts as if this is the first time she has heard this from me, she remains on Aricept5 from PCP & has not seen Neuro...we reviewed the following medical problems during today's office  visit >>     Pulmonary> Hx COPD/ Bronchiectasis/ Abn CT Chest w/ mucoid impactions, tracheobronchomalacia w/ air trapping, 10mm LLL nodule, Hx granulomatous lung dis & reported atyp mycobacterium in  the past>  See Hi-res CTChest 12/2014;  We encouraged a regular bronchodilator regimen (NEBS w/ Albut vs Duoneb Tid, ADVAIR500Bid, SPIRIVA daily), Guaifenesin 400mg -2Tid, Singulair10, Fluids, Flutter/ postural drainage/ purse-lip breathing exercises, & check sput for cultures but she could not be bothered & refused further pulm treatment/ testing sput/ etc=> still not cooperating w/ max chest physiotherapy regimen & states she only gets a sm amt of whitish sput (unable to successfully induce sput in office...    Cardiac> on Coreg6.25- 1/2Bid for ?HBP; she has mild AS & DiastCHF on 2DEcho and this needs follow up w/ her PCP vs Cards...    Endocrine> IDDM on Lantus & Novolog (A1c=6.3), HL on Crestor10 (FLP- controlled), Hypothyroid on Synthroid50, transiently elev serum calcium ?etiology (resolved spont), osteoporosis w/ mult fxs on Prolia Q64mo; managed by Kayla Thompson & last seen 06/09/15- note reviewed...    GYN> Hx ovarian cancer (in remission- details unknown), s/p hyst & Ca125=9 (08/2014)... She is followed by DrFernandez now...    Ortho/ Rheum> DJD w/ TKR, FM on Lyrica, Tramadol, Percocet,etc, severe LBP requires epidural shots; she is on a huge dose of Lyrica; PMR on Pred (details unknown); her Sed=52 and she saw [facility] for Rheum [facility] 09/03/15- severe OA, FM, PMR, & osteoporosis on Lyrica, Pred, Percocet, & Prolia; she felt worse when out of her Lyrica    Neuro> on Aricept5 per DrCrawford, I wonder if she is taking any/ all of her meds properly, no care giver accompanies pt to office visits, she is ?stoic ?oblivious...    Heme> Hg 10-11 range May2016 after fall w/ fx nose & wrist; similar now & may need further anemia work-up as well, deferred to PCP... EXAM showed Afeb, VSS, O2sat=96% on RA;  Wt=167#;  Heent-  neg, mallampati3;  Chest- decr BS bilat, not congested, w/o w/r/r heard;  Heart- RR gr1/6SEM w/o r/g detected;  Abd- soft, nontender, +panniculus;  Ext- VI w/o c/c/e...   CXR 11/04/15 showedCOPD, scarring in right mid lung, chr changes and NAD... IMP/PLAN>>  Berneice has a URI & mild COPD exac=> we will treat w/ Doxy100Bid (her request) and Medrol dosepak;  We again reviewed the recommended treatment protocol for her bronchiectasis (it was like she heard it for the 1st time)>  NEBS Tid, followed by [facility] & SPIRIVA daily, +Guaifenesin400-2Tid + Fluids, take the low dose Pred in AM (per Rheum) and the Singulair10 in PM, work to expectorate the phlegm but she declines to do chest PT, Flutter, postural drainage, etc... we plan ROV recheck in 15mo...  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    GI> prev eval apparently in [place] pt doesn't recall, no records avail, she has Prevacid30 & Zantac150Bid listed on med list but she says just prn... EXAM showed Afeb, VSS, O2sat=97% on RA;  Wt=162#;  Heent- neg, mallampati3;  Chest- decr BS bilat, not congested, w/o w/r/r heard;  Heart- RR gr1/6SEM w/o r/g detected;  Abd- soft, nontender, +panniculus;  Ext- VI w/o c/c/e...  IMP/PLAN>>  Kayla Thompson has serious multisys dis & is managed by DrCrawford-PCP;  From the pulm standpoint she continues to downplay the severity of her lung dis & still refuses/ forgets/ etc to do her breathing treatments as we have outlined for her- REC to use NEB Tid, followed by Advair500Bid, Spiriva once dailyGFN400-2Tid w/ fluids, Singulair10, & Flutter valve/ postural drain/ purse-lip breathig treatments TID... Today we reviewed this regimen & added a Medrol Dosepak... She now appears to have signif GI-GERD/ reflux/ prob LPR & we added a vigorous antireflux regimen=> Prevacid30 taken 30 miin before dinner, NPO after dinner, elev HOB 6" blocks... She needs to see GI for further eval & needs these recommendations reinforced by them & her PCP...  ~  April 13, 2016:  40mo ROV & once again Kayla Thompson returns by herself for a f/u visit> she tells me that she is doing satis, no new complaints or concerns;  When asked about her meds she says they are all the same but she does not know the names or the dosing regimen;  She states her breathing is the same- min SOB w/o change- eg w/ walking (says lim by knee); notes mild cough/ sput- clear w/o color or blood seen, no CP;  Today's agenda involves getting a handicap placard- we filled it out for her... 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 We re-iterate the max regimen at each office visit & print it out on the AVS for family to review & help pt with...    Cardiac> on Coreg6.25- 1/2Bid for ?HBP; she has mild AS & DiastCHF on 2DEcho and this needs follow up w/ her PCP (DrCrawford) vs Cards consult ...    Endocrine> IDDM on Lantus & Novolog (A1c=6.3), HL on Crestor10 (FLP- controlled), Hypothyroid on Synthroid50, transiently elev serum calcium ?etiology (resolved spont), osteoporosis w/ mult fxs on Prolia Q67mo; managed by Kayla Thompson but last seen 06/09/15- note reviewed; she sees DrCrawford frequently...    GYN> Hx  ovarian cancer (in remission- details unknown), s/p hyst & Ca125=9 (08/2014)... She is followed by DrFernandez now...    Ortho/ Rheum> DJD w/ TKR, FM on Lyrica, Tramadol, Percocet,etc, severe LBP requires epidural shots; she is on a huge dose of Lyrica; PMR on Pred (details unknown); her Sed=52 and she saw [facility] for Rheum 03/23/16- 26mo rov for  severe OA, FM, PMR, & osteoporosis on Lyrica 300Bid, Pred 2mg /d, Percocet, & Prolia (last dose 02/24/16); she felt worse when out of her Lyrica    Neuro & Psyche> on Aricept5 per DrCrawford plus Buspar & Cymbalta- I wonder if she is taking any/ all of her meds properly, no care  giver accompanies pt to office visits, she is ?stoic ?oblivious...    Heme> Hg 10-11 range May2016 after fall w/ fx nose & wrist; similar now & may need further anemia work-up as well, deferred to PCP... EXAM showed Afeb, VSS, O2sat=95% on RA;  Wt=160#;  Heent- neg, mallampati3;  Chest- decr BS bilat, not congested, w/o w/r/r heard;  Heart- RR gr1/6SEM w/o r/g detected;  Abd- soft, nontender, +panniculus;  Ext- VI w/o c/c/e...   Ambulatory Oximetry 04/13/16>  O2sat=92% on RA at rest w/ pulse= 80/min... She ambulated 1 Lap in office (185') w/ O2sat drop to 84% with pulse=89/min... IMP/PLAN>>  Kayla Thompson has exercise induced hypoxemia & needs a POC w/ O2 at 2L/min w/ activity;  We will check an ONO in the interim to see if she needs nocturnal O2 as well;  In the meanwhile we once again reviewed w/ the pt regarding her MAX respiratory regimen-- Albut via NEB Tid, followed by Advair500Bid & Spiriva daily; continue GFN 400mg -2Tid w/ fluids; on Pred 2mg /d by rheum for PMR; on Singulair10 & flonase;  We also reviewed her antireflux regimen w/ Prevacid30 taken 30 min before dinner, NPO after dinner, elev HOB 6"... we plan another recheck in 43mo...  ~  July 12, 2016:  4mo ROV & Kayla Thompson is here by herself again today- no complaints or concerns- states her breathing is good, she uses her portable O2 as needed (she doesn't like the O2 conc- makes too much noise, gives her migraines, etc), denies CP/ palpt/ SOB/ edema; notes min cough small amt whitish sput, no hemoptysis; denies f/c/s and says she is active & exercising on her own (does not go to gym etc- we discussed silver sneakers etc...    Her PCP is DrCrawford, requested referral to Urology for urinary incont, given handicap placard for parking, wants a new eye doctor due to droopy eyelids (rec [facility]), pt says that [facility] checked her anemia- "it's good, not to worry"... We reviewed the following medical problems during today's office visit >>     Pulmonary> Hx COPD/  Bronchiectasis/ Abn CT Chest w/ mucoid impactions, tracheobronchomalacia w/ air trapping, 55mm LLL nodule, Hx granulomatous lung dis & reported atyp mycobacterium in the past>  See Hi-res CTChest 12/2014;  We encouraged a regular bronchodilator regimen (NEBS w/ Albut vs Duoneb Tid, ADVAIR500Bid, SPIRIVA daily), Guaifenesin 400mg -2Tid, Singulair10, Fluids, Flutter/ postural drainage/ purse-lip breathing exercises, & check sput for cultures but she could not be bothered & refused further pulm treatment/ testing sput/ etc=> still not cooperating w/ max chest physiotherapy regimen & states she only gets a sm amt of whitish sput (unable to successfully induce sput in office... We re-iterate the max regimen at each office visit & print it out on the AVS for family to review & help pt with...    Cardiac> on Coreg6.25-  1/2Bid for ?HBP; she has mild AS & DiastCHF on 2DEcho and this needs follow up w/ her PCP (DrCrawford) vs Cards consult ...    Endocrine> IDDM on Lantus & Novolog (A1c=6.3), HL on Crestor10 (FLP- controlled), Hypothyroid on Synthroid50, transiently elev serum calcium ?etiology (resolved spont), osteoporosis w/ mult fxs on Prolia Q11mo; managed by Kayla Thompson but last seen 06/09/15- note reviewed; she sees DrCrawford frequently...    GYN> Hx ovarian cancer (in remission- details unknown), s/p hyst & Ca125=9 (08/2014)... She is followed by DrFernandez now...    Ortho/ Rheum> DJD w/ TKR, FM on Lyrica, Tramadol, Percocet,etc, severe LBP requires epidural shots; she is on a huge dose of Lyrica; PMR on Pred (details unknown); her Sed=52 and she [facility] for Rheum 03/23/16- 108mo rov for  severe OA, FM, PMR, & osteoporosis on Lyrica 300Bid, Pred 2mg /d, Percocet, & Prolia (last dose 02/24/16); she felt worse when out of her Lyrica    Neuro & Psyche> on Aricept5 per DrCrawford plus Buspar & Cymbalta- I wonder if she is taking any/ all of her meds properly, no care giver accompanies pt to office visits, she is ?stoic  ?oblivious...    Heme> Hg 10-11 range May2016 after fall w/ fx nose & wrist; similar now & may need further anemia work-up as well, deferred to PCP... EXAM showed Afeb, VSS, O2sat=97% on RA;  Wt=151#;  Heent- neg, mallampati3;  Chest- decr BS bilat, not congested, w/o w/r/r heard;  Heart- RR gr1/6SEM w/o r/g detected;  Abd- soft, nontender, +panniculus;  Ext- VI w/o c/c/e...   Ambulatory Oximetry 07/12/16>  O2sat=94% on RA at rest w/ pulse=97/min;  She ambulated 2 laps in office (185'ea) w/ lowest O2sat=88% w/ pulse=112/min;  This is improved from [id number];  Advised O2 at 2L/min w/ activities...  IMP/PLAN>>  Pt is asked to use O2 w/ ambulation & reminded again about the recommended pulm Rx- NEBS w/ Albut vs Duoneb Tid, ADVAIR500Bid, SPIRIVA daily, Guaifenesin 400mg -2Tid, Singulair10, Fluids, Flutter/ postural drainage/ purse-lip breathing exercises; call for any breathing issues, rov ~32mo...  ~  December 01, 2016:  62mo ROV & Kayla Thompson is here by herself again today (daughter brought her but is waiting in the car per pt)>  She was referred by DrCrawford for pulm eval due to cough & dyspnea, records from [facility] indicated a hx of asthma/ bronchiectasis/ mult pulm nodules, she had left lung surg in 2000 for a benign granuloma & there was a question of an atypical mycobacterium; she is rather sedentary & poorly conditioned; she is a never smoker w/ 2nd hand smoke exposure;  CT Chest 10/16 showed> Norm heart size, atherosclerosis of Ao/ great vessels/ coronaries, no adenopathy, atrophic thyroid; ?small sliding pulm hernia betw the lat 7th & 8th ribs w/ scarring; extensive bronchiectasis bilat w/ mucoid impactions within dilated airways (infectious/inflamm bronchiolitis- r/o MAI), 80mm LLL nodule, tracheobronchomalacia w/ extensive bilat air trapping on expiration, [place], old healed right 6th-7th rib fx, DJD Tspine... She has demonstrated exercise hypoxemia & never did the requested ONO- she refuses to use her Port O2  stating "I have allergies" and uses it prn only; she lives w/ daughter but she never comes back to the exam room w/ her mother during the OVs; pt says she rests well, not SOB, breathing well & denies cough/ sput/ hemoptysis, CP, f/c/s, etc... We have stressed a MAX bronchodil regimen w/ NEBS w/ Albut vs Duoneb Tid, ADVAIR500Bid, SPIRIVA daily, Guaifenesin 400mg -2Tid, Singulair10, Fluids, Flutter/ postural drainage/ purse-lip breathing exercises and an antireflux  regimen...     She has numerous medical specialists per Epic review>  Ophthalmology- DrShapiro, sent to oculofacial plastic surg DrWhite, Endocrine- Kayla Thompson, [place], PCP- DrCrawford, on Prolia q33mo... EXAM showed Afeb, VSS, O2sat=100% on RA;  Wt=144#;  Heent- neg, mallampati3;  Chest- decr BS bilat, not congested, w/o w/r/r heard;  Heart- RR gr1/6SEM w/o r/g detected;  Abd- soft, nontender, +panniculus;  Ext- VI w/o c/c/e...  IMP/PLAN>>  She is reminded about her mandatory pulm regimen & this is again written for her to share w/ family/ care givers but she remains unconcerned, indifferent, not motivated...   ~  February 24, 2017:  15mo ROV & add-on appt> pt has returned for oxygen recertification per [employer]> Their letter indicates that a new CMN is needed after 2mo of service & requires re-eval within 90d of the new medicare oxygen CMN... SEE ABOVE NOTES-- She was referred by DrCrawford for pulm eval due to cough & dyspnea, records from [facility] indicated a hx of asthma/ bronchiectasis/ mult pulm nodules, she had left lung surg in 2000 for a benign granuloma & there was a question of an atypical mycobacterium; she is rather sedentary & poorly conditioned; she is a never smoker w/ 2nd hand smoke exposure;  CT Chest 10/16 showed> Norm heart size, atherosclerosis of Ao/ great vessels/ coronaries, no adenopathy, atrophic thyroid; ?small sliding pulm hernia betw the lat 7th & 8th ribs w/ scarring; extensive bronchiectasis bilat w/ mucoid impactions  within dilated airways (infectious/inflamm bronchiolitis- r/o MAI), 63mm LLL nodule, tracheobronchomalacia w/ extensive bilat air trapping on expiration, [place], old healed right 6th-7th rib fx, DJD Tspine... She has demonstrated exercise hypoxemia & never did the requested ONO- she refuses to use her Port O2 stating "I have allergies" and uses it prn only; she lives w/ daughter but she never comes back to the exam room w/ her mother during the OVs; pt says she rests well, not SOB, breathing well & denies cough/ sput/ hemoptysis, CP, f/c/s, etc... We have stressed a MAX bronchodil regimen w/ NEBS w/ Albut vs Duoneb Tid, ADVAIR500Bid, SPIRIVA daily, Guaifenesin 400mg -2Tid, Singulair10, Fluids, Flutter/ postural drainage/ purse-lip breathing exercises and an antireflux regimen...  EXAM showed Afeb, VSS, O2sat=100% on RA;  Wt=144#;  Heent- neg, mallampati3;  Chest- decr BS bilat, not congested, w/o w/r/r heard;  Heart- RR gr1/6SEM w/o r/g detected;  Abd- soft, nontender, +panniculus;  Ext- VI w/o c/c/e...   Ambulatory oximetry 02/24/17>  O2sat=96% on RA at rest w/ pulse=87/min;  She ambulated 3 laps (185'ea) in the office w/ nadir O2sat=88% w/ pulse=113/min IMP/PLAN>>  She has an O2 conc at home & not using it, won't use it at night (too noisey, the cord is yoo long, she's NEVER usedit she says);  [employer]- uses "as needed" "I like it" she says, averages once per week "to give myself a breather";  She didn't take it on her trip to [facility], not using it when out & about, uses it some when doing PT exercises... She is again reminded to use the O2 at 1L/min Qhs & should check w/ DME [employer] regarding the cord & machine noise;  Advised to use the O2 at 2L/min w/ ambulation;  Continue Rx w/ NEB rx Tid followed by Advair250Bid & Spiriva once daily, mucinex 1200Bid + fluids, and the antireflux regimen- Axid150 ~28min before dinner, NPO after dinner, elev HOB 6", ok Zantac150 at bedtime w/ sip of water... We plan ROV in  65mo w/ CXR...  ~  May 18, 2017:  27mo ROV & add-on appt requested for increased cough, brown mucus, chest soreness>  Kayla Thompson returns for an add-on appt w/ her daughter from [place] c/o several days hx cogh, yellow brown mucus, incr SOB, etc;  She fell 6wks ago w/ ER eval after injuring lower left ribs & laceration at left elbow;  CXR/Ribs 04/07/17 showed no acute fx, stable chr changes in both lungs- NAD;  Placed on Doxy & given hydrocodone for prn use...     [place] says she knows her meds and is great w/ the dosages; they have upcoming appt at the wound care clinic due to the elbow laceration healing...    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  EXAM showed Afeb, VSS, O2sat=96% on RA;  Wt=131#;  Heent- neg, mallampati3;  Chest- decr BS bilat, not congested, w/o w/r/r heard;  Heart- RR gr1/6SEM w/o r/g detected;  Abd- soft, nontender, +panniculus;  Ext- VI w/o c/c/e...   LABS 05/18/17>  [place]- ok x BS=37 (asked to eat ASAP), Cr=1.01, LFTs- wnl;  CBC- Hg=12.6, WBC=20.1;  Sed=23  Sputum C&S> she was unable to collect a specimen while here in the office... IMP/PLAN>>  Kayla Thompson has a COPD exac w/ yellow brown mucus and WBC ct of 20K-- we deiced to treat w/ LEVAQUIN 750mg  one daily x 7d, and give her a Depo shot & Medrol dosepak for the bronchial inflamm;  We are going to send in new rx for DUONEB to [place] ask her (again) to do this TID regularly followed by the Advair bid & Spiriva Qd;  She will continue the [facility] 1200mg  Bid, fluids, and use the IS Tid as part of her breathing treatments   ~  June 14, 2017:  17mo ROV & Kayla Thompson returns having fallen again, this time w/ a pelvic fx & presents in wheelchair; she  has had XRays and CT scan by Ortho- DrMcKinley ([facility]- PCP & sports med) but we do not have notes from them;  She has Hx of COPD/ Bronchiectasis/ Abn CT Chest w/ mucoid impactions, tracheobronchomalacia w/ air trapping, 26mm LLL nodule, granulomatous lung dis & 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     She notes min cough/ sput/ DOE w/ min activ & getting PT at home; prev treated w/ Levaquin & Medrol dosepak=> she states improved back to baseline... EXAM showed Afeb, VSS, O2sat=95% on RA;  Wt=137#;  Heent- neg, mallampati3;  Chest- decr BS bilat, not congested, w/o w/r/r heard;  Heart- RR gr1/6SEM w/o r/g detected;  Abd- soft, nontender, +panniculus;  Ext- VI w/o c/c/e...  IMP/PLAN>>  Again reminded to be diligent w/ her bronchodilator regimen, Mucinex, fluids, IS/flutter;  We plan recheck in 24mo...    Past Medical History:  Diagnosis Date  . Arthritis   . Asthma   . Diabetes mellitus without complication ([place])   . Fibromyalgia   . GERD (gastroesophageal reflux disease)   . History of chemotherapy   . History of fractured pelvis   . Hypertension   . Osteoporosis   . Osteoporosis   .  Ovarian cancer [facility])     Past Surgical History  Procedure Laterality Date  . Hip surgey    . Knee surgey    . Lung surgery >> resection of nodule 2000, proved to be a benign granuloma & ?atyp mycobacterium ident at the same time?    . Bladder suspension    . Ankle fracture surgery      plate and screws  . Abdominal hysterectomy  1996    Outpatient Encounter Medications as of 06/14/2017  Medication Sig  . albuterol (PROVENTIL HFA;VENTOLIN HFA) 108 (90 Base) MCG/ACT inhaler Inhale 2 puffs into the lungs every 6 (six) hours as needed for wheezing or shortness of breath.  . Apoaequorin (PREVAGEN) 10 MG CAPS Take 10 m  by mouth daily.  . BD PEN NEEDLE NANO U/F 32G X 4 MM MISC USE 4 TIMES A DAY  . carvedilol (COREG) 6.25 MG tablet Take 3.125 mg by mouth 2 (two) times daily with a meal.   . denosumab (PROLIA) 60 MG/ML SOLN injection Inject 60 mg into the skin every 6 (six) months. Administer in upper arm, thigh, or abdomen  . Dexlansoprazole 30 MG capsule Take 1 capsule (30 mg total) by mouth daily.  Marland Kitchen Dextromethorphan-Guaifenesin (MUCINEX DM MAXIMUM STRENGTH) 60-1200 MG TB12 Take 1 tablet by mouth 2 (two) times daily as needed (for cough/phlegm). Reported on 06/23/2015  . donepezil (ARICEPT) 5 MG tablet TAKE 1 TABLET (5 MG TOTAL) BY MOUTH AT BEDTIME.  . DULoxetine HCl (CYMBALTA PO) Take 90 mg by mouth daily.  . Fluticasone-Salmeterol (ADVAIR DISKUS) 500-50 MCG/DOSE AEPB Inhale 1 puff into the lungs 2 (two) times daily.  Marland Kitchen glucose blood (FREESTYLE TEST STRIPS) test strip Use to test blood sugar 3 times daily. Dx: E11.65  . Insulin Glargine (LANTUS SOLOSTAR) 100 UNIT/ML Solostar Pen INJECT 20 UNITS SUBCUTANEOUSLY EVERY MORNING  . insulin lispro (HUMALOG KWIKPEN) 100 UNIT/ML KiwkPen Inject 4-6 units into the skin daily.  Marland Kitchen ipratropium-albuterol (DUONEB) 0.5-2.5 (3) MG/3ML SOLN Take 3 mLs by nebulization 3 (three) times daily.  . Lancets (FREESTYLE) lancets Use to test blood sugar 3 times daily. Dx: E11.65  . LANTUS SOLOSTAR 100 UNIT/ML Solostar Pen INJECT 30 UNITS SUBCUTANEOUSLY EVERY MORNING  . levothyroxine (SYNTHROID, LEVOTHROID) 50 MCG tablet Take 1 tablet (50 mcg total) by mouth daily before breakfast.  . montelukast (SINGULAIR) 10 MG tablet TAKE 1 TABLET (10 MG TOTAL) BY MOUTH DAILY.  Marland Kitchen predniSONE 2 MG TBEC Take 2 mg by mouth daily.  . pregabalin (LYRICA) 100 MG capsule Take 1 capsule (100 mg total) by mouth 2 (two) times daily.  . ranitidine (ZANTAC) 150 MG tablet Take 150 mg by mouth 2 (two) times daily as needed for heartburn.  . rosuvastatin (CRESTOR) 10 MG tablet Take 1 tablet (10 mg total) by mouth every  evening.  Marland Kitchen SPIRIVA HANDIHALER 18 MCG inhalation capsule PLACE 1 CAPSULE INTO INHALER AND INHALE AS DIRECTED EVERY DAY  . traMADol (ULTRAM) 50 MG tablet Take 1 tablet (50 mg total) by mouth every 8 (eight) hours as needed.  . trimethoprim (TRIMPEX) 100 MG tablet Take 100 mg by mouth daily.  Marland Kitchen zolpidem (AMBIEN) 5 MG tablet Take 1 tablet (5 mg total) by mouth at bedtime.  . [DISCONTINUED] levofloxacin (LEVAQUIN) 750 MG tablet Take 1 tablet (750 mg total) by mouth daily.  . [DISCONTINUED] methylPREDNISolone (MEDROL DOSEPAK) 4 MG TBPK tablet 6 day pak-take as directed  . [DISCONTINUED] mirabegron ER (MYRBETRIQ) 50 MG TB24 tablet Take 1 tablet (50 mg total)  by mouth daily.   No facility-administered encounter medications on file as of 06/14/2017.     Allergies  Allergen Reactions  . Latex Rash  . Penicillins Rash     Immunization History  Administered Date(s) Administered  . Influenza, High Dose Seasonal PF 12/01/2016  . Influenza,inj,Quad PF,6+ Mos 01/17/2015, 12/18/2015  . Pneumococcal Polysaccharide-23 12/18/2015  . Tdap 08/02/2014, 04/07/2017    Current Medications, Allergies, Past Medical History, Past Surgical History, Family History, and Social History were reviewed in [employer] record.   Review of Systems             All symptoms NEG except where BOLDED >>  Constitutional:  F/C/S, fatigue, anorexia, unexpected weight change. HEENT:  HA, visual changes, hearing loss, earache, nasal symptoms, sore throat, mouth sores, hoarseness. Resp:  cough, sputum, hemoptysis; SOB, tightness, wheezing. Cardio:  CP, palpit, DOE, orthopnea, edema. GI:  N/V/D/C, blood in stool; reflux, abd pain, distention, gas. GU:  dysuria, freq, urgency, hematuria, flank pain, voiding difficulty. MS:  joint pain, swelling, tenderness, decr ROM; neck pain, back pain, etc. Neuro:  HA, tremors, seizures, dizziness, syncope, weakness, numbness, gait abn. Skin:  suspicious lesions or  skin rash. Heme:  adenopathy, bruising, bleeding. Psyche:  confusion, agitation, sleep disturbance, hallucinations, anxiety, depression suicidal.   Objective:   Physical Exam       Vital Signs:  Reviewed...   General:  WD, WN, 82 y/o WF in NAD; alert & oriented; pleasant & cooperative... HEENT:  [place]/AT; Conjunctiva- pink, Sclera- nonicteric, EOM-wnl, PERRLA, EACs-clear, TMs-wnl; NOSE-clear; THROAT-clear & wnl.  Neck:  Supple w/ fairROM; no JVD; normal carotid impulses w/o bruits; no thyromegaly or nodules palpated; no lymphadenopathy.  Chest:  Decr BS bilat, few scat rhonchi otherw clear not congested no wheezing rales or signs of consolidation... Heart:  Regular Rhythm; norm S1 & S2 w/ Gr1/6 SEM w/o rubs or gallops detected... Abdomen:  Soft & nontender, +panniculus- no guarding or rebound; normal bowel sounds; no organomegaly or masses palpated. Ext:  decrROM; without deformities +arthritic changes; +venous insuffic, no edema;  Pulses intact w/o bruits. Neuro:  No focal neuro deficit; abn gait & balance poor, uses cane... Derm:  No lesions noted; no rash etc. Lymph:  No cervical, supraclavicular, axillary, or inguinal adenopathy palpated.   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    Cardiac>  on Coreg6.25- 1/2Bid for ?HBP; she has mild AS & DiastCHF on 2DEcho and this needs follow up w/ her PCP vs Cards...    Endocrine> IDDM on Lantus & Novolog (A1c=6.3), HL on Crestor10 (FLP- controlled), Hypothyroid on Synthroid50, transiently elev serum calcium ?etiology (resolved spont), osteoporosis w/ mult fxs on Prolia Q18mo; managed by Kayla Thompson & last seen 06/09/15- note reviewed...    GI> She presented w/ symptoms of reflux/ LPR w/ cough while eating etc => needs  GI eval & we will refer (see recs below)...    GYN> Hx ovarian cancer (in remission- details unknown), s/p hyst & Ca125=9 (08/2014)... She is followed by DrFernandez now...    Ortho/ Rheum> DJD w/ TKR, FM on Lyrica, Tramadol, Percocet,etc, severe LBP requires epidural shots; she is on a huge dose of Lyrica; PMR on Pred (details unknown); her Sed=52 and she saw [facility] for Rheum last 09/03/15- severe OA, FM, PMR, & osteoporosis on Lyrica, Pred, Percocet, & Prolia; she felt worse when out of her Lyrica    Neuro> on Aricept5 per DrCrawford, I wonder if she is taking any/ all of her meds properly, no care giver accompanies pt to office visits, she is ?stoic ?oblivious...    Heme> Hg 10-11 range May2016 after fall w/ fx nose & wrist; similar now & may need further anemia work-up as well, deferred to PCP...  PLAN >>   01/19/15>   We discussed Rx w/ ZPak for her URI (it could also help her if she has atyp-mycobacterium); we compromised rx w/ NEBS TID- followed by AdvairBid & Spiriva once daily, plus the MUCINEX1200mg Bid w/ fluids;  She again declines the added benefit from a vigorous chest PT regimen, flutter valve, postural drainage, purse lip breathing, etc... 06/23/15>   We again outlined for Kayla Thompson a vigorous home treatment regimen for her bronchiectasis & mucoid impactions including a TID regimen>  NEB w/ Albut vs Duoneb Tid, followed by her Advair500Bid & Spiriva once/d, Guaifenesin 400mg  tabs- 2Tid w/ fluids, take her low dose Pred in AM &  Singulair in PM; she declines to do chest PT/ Flutter & postural drainage treatments... 10/01/15>   Kayla Thompson has a URI & mild COPD exac=> we will treat w/ Doxy100Bid (her request) and Medrol dosepak;  We again reviewed the recommended treatment protocol for her bronchiectasis (it was like she heard it for the 1st time)>  NEBS Tid, followed by [facility] & SPIRIVA daily, +Guaifenesin400-2Tid + Fluids, take the low dose Pred in AM (per Rheum) and the Singulair10 in PM, work to expectorate the phlegm but she declines to do chest PT, Flutter, postural drainage, etc... we plan ROV recheck in 43mo... 12/25/15>   Kayla Thompson has serious multisys dis & is managed by DrCrawford-PCP;  From the pulm standpoint she continues to downplay the severity of her lung dis & still refuses/ forgets/ etc to do her breathing treatments as we have outlined for her- REC to use NEB Tid, followed by Advair500Bid, Spiriva once dailyGFN400-2Tid w/ fluids, Singulair10, & Flutter valve/ postural drain/ purse-lip breathig treatments TID... Today we reviewed this regimen & added a Medrol Dosepak... She now appears to have signif GI-GERD/ reflux/ prob LPR & we added a vigorous antireflux regimen=> Prevacid30 taken 30 miin before dinner, NPO after dinner, elev HOB 6" blocks... She needs to see GI for further eval & needs these recommendations reinforced by them & her PCP. 04/13/16>   Kayla Thompson has exercise induced hypoxemia & needs a POC w/ O2 at 2L/min w/ activity;  We will check an ONO in the interim to see if she needs nocturnal O2 as well;  In the meanwhile we once again reviewe4d w/ the pt regarding her MAX respiratory regimen-- Albut via NEB Tid, followed by Advair500Bid & Spiriva daily; continue GFN 400mg -2Tid w/ fluids; on Pred 2mg /d by rheum for PMR; on Singulair10 & flonase;  We also reviewed her antireflux regimen w/ Prevacid30 taken 30 min before dinner, NPO after dinner, elev HOB 6" 07/12/16>   Pt is  asked to use O2 w/ ambulation & reminded  again about the recommended pulm Rx- NEBS w/ Albut vs Duoneb Tid, ADVAIR500Bid, SPIRIVA daily, Guaifenesin 400mg -2Tid, Singulair10, Fluids, Flutter/ postural drainage/ purse-lip breathing exercises; call for any breathing issues, rov ~55mo 12/01/16>   She is reminded about her mandatory pulm regimen & this is again written for her to share w/ family/ care givers but she remains unconcerned, indifferent, not motivated 02/24/17>   Sent by [employer] for O2 recert... 05/18/17>    Kayla Thompson has a COPD exac w/ yellow brown mucus and WBC ct of 20K-- we deiced to treat w/ LEVAQUIN 750mg  one daily x 7d, and give her a Depo shot & Medrol dosepak for the bronchial inflamm;  We are going to send in new rx for DUONEB to [place] ask her (again) to do this TID regularly followed by the Advair bid & Spiriva Qd;  She will continue the [facility] 1200mg  Bid, fluids, and use the IS Tid as part of her breathing treatments 06/14/17>   Again reminded to be diligent w/ her bronchodilator regimen, Mucinex, fluids, IS/flutter;  We plan recheck in 72mo...   Plan:     Patient's Medications  New Prescriptions   No medications on file  Previous Medications   ALBUTEROL (PROVENTIL HFA;VENTOLIN HFA) 108 (90 BASE) MCG/ACT INHALER    Inhale 2 puffs into the lungs every 6 (six) hours as needed for wheezing or shortness of breath.   APOAEQUORIN (PREVAGEN) 10 MG CAPS    Take 10 m by mouth daily.   BD PEN NEEDLE NANO U/F 32G X 4 MM MISC    USE 4 TIMES A DAY   CARVEDILOL (COREG) 6.25 MG TABLET    Take 3.125 mg by mouth 2 (two) times daily with a meal.    DENOSUMAB (PROLIA) 60 MG/ML SOLN INJECTION    Inject 60 mg into the skin every 6 (six) months. Administer in upper arm, thigh, or abdomen   DEXLANSOPRAZOLE 30 MG CAPSULE    Take 1 capsule (30 mg total) by mouth daily.   DEXTROMETHORPHAN-GUAIFENESIN (MUCINEX DM MAXIMUM STRENGTH) 60-1200 MG TB12    Take 1 tablet by mouth 2 (two) times daily as needed (for cough/phlegm). Reported on 06/23/2015    DONEPEZIL (ARICEPT) 5 MG TABLET    TAKE 1 TABLET (5 MG TOTAL) BY MOUTH AT BEDTIME.   DULOXETINE HCL (CYMBALTA PO)    Take 90 mg by mouth daily.   FLUTICASONE-SALMETEROL (ADVAIR DISKUS) 500-50 MCG/DOSE AEPB    Inhale 1 puff into the lungs 2 (two) times daily.   GLUCOSE BLOOD (FREESTYLE TEST STRIPS) TEST STRIP    Use to test blood sugar 3 times daily. Dx: E11.65   INSULIN GLARGINE (LANTUS SOLOSTAR) 100 UNIT/ML SOLOSTAR PEN    INJECT 20 UNITS SUBCUTANEOUSLY EVERY MORNING   INSULIN LISPRO (HUMALOG KWIKPEN) 100 UNIT/ML KIWKPEN    Inject 4-6 units into the skin daily.   IPRATROPIUM-ALBUTEROL (DUONEB) 0.5-2.5 (3) MG/3ML SOLN    Take 3 mLs by nebulization 3 (three) times daily.   LANCETS (FREESTYLE) LANCETS    Use to test blood sugar 3 times daily. Dx: E11.65   LANTUS SOLOSTAR 100 UNIT/ML SOLOSTAR PEN    INJECT 30 UNITS SUBCUTANEOUSLY EVERY MORNING   LEVOTHYROXINE (SYNTHROID, LEVOTHROID) 50 MCG TABLET    Take 1 tablet (50 mcg total) by mouth daily before breakfast.   MONTELUKAST (SINGULAIR) 10 MG TABLET    TAKE 1 TABLET (10 MG TOTAL) BY MOUTH DAILY.   PREDNISONE 2 MG TBEC  Take 2 mg by mouth daily.   PREGABALIN (LYRICA) 100 MG CAPSULE    Take 1 capsule (100 mg total) by mouth 2 (two) times daily.   RANITIDINE (ZANTAC) 150 MG TABLET    Take 150 mg by mouth 2 (two) times daily as needed for heartburn.   ROSUVASTATIN (CRESTOR) 10 MG TABLET    Take 1 tablet (10 mg total) by mouth every evening.   SPIRIVA HANDIHALER 18 MCG INHALATION CAPSULE    PLACE 1 CAPSULE INTO INHALER AND INHALE AS DIRECTED EVERY DAY   TRAMADOL (ULTRAM) 50 MG TABLET    Take 1 tablet (50 mg total) by mouth every 8 (eight) hours as needed.   TRIMETHOPRIM (TRIMPEX) 100 MG TABLET    Take 100 mg by mouth daily.   ZOLPIDEM (AMBIEN) 5 MG TABLET    Take 1 tablet (5 mg total) by mouth at bedtime.  Modified Medications   No medications on file  Discontinued Medications   LEVOFLOXACIN (LEVAQUIN) 750 MG TABLET    Take 1 tablet (750 mg total) by  mouth daily.   METHYLPREDNISOLONE (MEDROL DOSEPAK) 4 MG TBPK TABLET    6 day pak-take as directed   MIRABEGRON ER (MYRBETRIQ) 50 MG TB24 TABLET    Take 1 tablet (50 mg total) by mouth daily.

## 2017-06-14 NOTE — Patient Instructions (Signed)
Today we updated your med list in our EPIC system...    Continue your current medications the same...  Keep up the good work w/ your breathing treatments--    DUONEB via nebulizer machine 3 times daily...    Followed by the ADVIAR twice daily & the Spiriva once daily...    Add-in the INCENTIVE SPIROMETER lung exerciser 3 times daily as well...  Continue the [facility] 1200mg  twice daily w/ extra fluids by mouth...  Keep up the good work w/ your physical therapy & gradually increase your activity level...  Let's plan a follow up visit in 33mo, sooner if needed for problems.Marland KitchenMarland Kitchen

## 2017-06-15 ENCOUNTER — Ambulatory Visit: Payer: Medicare Other | Admitting: Pulmonary Disease

## 2017-06-15 DIAGNOSIS — M353 Polymyalgia rheumatica: Secondary | ICD-10-CM | POA: Diagnosis not present

## 2017-06-15 DIAGNOSIS — I110 Hypertensive heart disease with heart failure: Secondary | ICD-10-CM | POA: Diagnosis not present

## 2017-06-15 DIAGNOSIS — S51812D Laceration without foreign body of left forearm, subsequent encounter: Secondary | ICD-10-CM | POA: Diagnosis not present

## 2017-06-15 DIAGNOSIS — E1165 Type 2 diabetes mellitus with hyperglycemia: Secondary | ICD-10-CM | POA: Diagnosis not present

## 2017-06-15 DIAGNOSIS — M797 Fibromyalgia: Secondary | ICD-10-CM | POA: Diagnosis not present

## 2017-06-15 DIAGNOSIS — I5032 Chronic diastolic (congestive) heart failure: Secondary | ICD-10-CM | POA: Diagnosis not present

## 2017-06-16 DIAGNOSIS — S51812D Laceration without foreign body of left forearm, subsequent encounter: Secondary | ICD-10-CM | POA: Diagnosis not present

## 2017-06-16 DIAGNOSIS — I5032 Chronic diastolic (congestive) heart failure: Secondary | ICD-10-CM | POA: Diagnosis not present

## 2017-06-16 DIAGNOSIS — M353 Polymyalgia rheumatica: Secondary | ICD-10-CM | POA: Diagnosis not present

## 2017-06-16 DIAGNOSIS — I110 Hypertensive heart disease with heart failure: Secondary | ICD-10-CM | POA: Diagnosis not present

## 2017-06-16 DIAGNOSIS — M25551 Pain in right hip: Secondary | ICD-10-CM | POA: Diagnosis not present

## 2017-06-16 DIAGNOSIS — M797 Fibromyalgia: Secondary | ICD-10-CM | POA: Diagnosis not present

## 2017-06-16 DIAGNOSIS — E1165 Type 2 diabetes mellitus with hyperglycemia: Secondary | ICD-10-CM | POA: Diagnosis not present

## 2017-06-20 DIAGNOSIS — M25522 Pain in left elbow: Secondary | ICD-10-CM | POA: Diagnosis not present

## 2017-06-20 DIAGNOSIS — M810 Age-related osteoporosis without current pathological fracture: Secondary | ICD-10-CM | POA: Diagnosis not present

## 2017-06-20 DIAGNOSIS — Z9181 History of falling: Secondary | ICD-10-CM | POA: Diagnosis not present

## 2017-06-20 DIAGNOSIS — E1165 Type 2 diabetes mellitus with hyperglycemia: Secondary | ICD-10-CM | POA: Diagnosis not present

## 2017-06-20 DIAGNOSIS — M353 Polymyalgia rheumatica: Secondary | ICD-10-CM | POA: Diagnosis not present

## 2017-06-20 DIAGNOSIS — Z7952 Long term (current) use of systemic steroids: Secondary | ICD-10-CM | POA: Diagnosis not present

## 2017-06-20 DIAGNOSIS — I110 Hypertensive heart disease with heart failure: Secondary | ICD-10-CM | POA: Diagnosis not present

## 2017-06-20 DIAGNOSIS — Z794 Long term (current) use of insulin: Secondary | ICD-10-CM | POA: Diagnosis not present

## 2017-06-20 DIAGNOSIS — J479 Bronchiectasis, uncomplicated: Secondary | ICD-10-CM | POA: Diagnosis not present

## 2017-06-20 DIAGNOSIS — M797 Fibromyalgia: Secondary | ICD-10-CM | POA: Diagnosis not present

## 2017-06-20 DIAGNOSIS — I5032 Chronic diastolic (congestive) heart failure: Secondary | ICD-10-CM | POA: Diagnosis not present

## 2017-06-20 DIAGNOSIS — J45909 Unspecified asthma, uncomplicated: Secondary | ICD-10-CM | POA: Diagnosis not present

## 2017-06-20 DIAGNOSIS — Z7951 Long term (current) use of inhaled steroids: Secondary | ICD-10-CM | POA: Diagnosis not present

## 2017-06-21 DIAGNOSIS — E1165 Type 2 diabetes mellitus with hyperglycemia: Secondary | ICD-10-CM | POA: Diagnosis not present

## 2017-06-21 DIAGNOSIS — I5032 Chronic diastolic (congestive) heart failure: Secondary | ICD-10-CM | POA: Diagnosis not present

## 2017-06-21 DIAGNOSIS — M25522 Pain in left elbow: Secondary | ICD-10-CM | POA: Diagnosis not present

## 2017-06-21 DIAGNOSIS — M797 Fibromyalgia: Secondary | ICD-10-CM | POA: Diagnosis not present

## 2017-06-21 DIAGNOSIS — M353 Polymyalgia rheumatica: Secondary | ICD-10-CM | POA: Diagnosis not present

## 2017-06-21 DIAGNOSIS — I110 Hypertensive heart disease with heart failure: Secondary | ICD-10-CM | POA: Diagnosis not present

## 2017-06-22 ENCOUNTER — Other Ambulatory Visit: Payer: Self-pay | Admitting: Internal Medicine

## 2017-06-22 NOTE — Telephone Encounter (Signed)
Please schedule appt

## 2017-06-23 DIAGNOSIS — I5032 Chronic diastolic (congestive) heart failure: Secondary | ICD-10-CM | POA: Diagnosis not present

## 2017-06-23 DIAGNOSIS — E1165 Type 2 diabetes mellitus with hyperglycemia: Secondary | ICD-10-CM | POA: Diagnosis not present

## 2017-06-23 DIAGNOSIS — I110 Hypertensive heart disease with heart failure: Secondary | ICD-10-CM | POA: Diagnosis not present

## 2017-06-23 DIAGNOSIS — M25522 Pain in left elbow: Secondary | ICD-10-CM | POA: Diagnosis not present

## 2017-06-23 DIAGNOSIS — M797 Fibromyalgia: Secondary | ICD-10-CM | POA: Diagnosis not present

## 2017-06-23 DIAGNOSIS — M353 Polymyalgia rheumatica: Secondary | ICD-10-CM | POA: Diagnosis not present

## 2017-06-28 DIAGNOSIS — E1165 Type 2 diabetes mellitus with hyperglycemia: Secondary | ICD-10-CM | POA: Diagnosis not present

## 2017-06-28 DIAGNOSIS — I110 Hypertensive heart disease with heart failure: Secondary | ICD-10-CM | POA: Diagnosis not present

## 2017-06-28 DIAGNOSIS — I5032 Chronic diastolic (congestive) heart failure: Secondary | ICD-10-CM | POA: Diagnosis not present

## 2017-06-28 DIAGNOSIS — M353 Polymyalgia rheumatica: Secondary | ICD-10-CM | POA: Diagnosis not present

## 2017-06-28 DIAGNOSIS — M797 Fibromyalgia: Secondary | ICD-10-CM | POA: Diagnosis not present

## 2017-06-28 DIAGNOSIS — M25522 Pain in left elbow: Secondary | ICD-10-CM | POA: Diagnosis not present

## 2017-06-30 DIAGNOSIS — M25522 Pain in left elbow: Secondary | ICD-10-CM | POA: Diagnosis not present

## 2017-06-30 DIAGNOSIS — M797 Fibromyalgia: Secondary | ICD-10-CM | POA: Diagnosis not present

## 2017-06-30 DIAGNOSIS — M353 Polymyalgia rheumatica: Secondary | ICD-10-CM | POA: Diagnosis not present

## 2017-06-30 DIAGNOSIS — I5032 Chronic diastolic (congestive) heart failure: Secondary | ICD-10-CM | POA: Diagnosis not present

## 2017-06-30 DIAGNOSIS — I110 Hypertensive heart disease with heart failure: Secondary | ICD-10-CM | POA: Diagnosis not present

## 2017-06-30 DIAGNOSIS — E1165 Type 2 diabetes mellitus with hyperglycemia: Secondary | ICD-10-CM | POA: Diagnosis not present

## 2017-07-04 DIAGNOSIS — I5032 Chronic diastolic (congestive) heart failure: Secondary | ICD-10-CM | POA: Diagnosis not present

## 2017-07-04 DIAGNOSIS — M25522 Pain in left elbow: Secondary | ICD-10-CM | POA: Diagnosis not present

## 2017-07-04 DIAGNOSIS — M797 Fibromyalgia: Secondary | ICD-10-CM | POA: Diagnosis not present

## 2017-07-04 DIAGNOSIS — M353 Polymyalgia rheumatica: Secondary | ICD-10-CM | POA: Diagnosis not present

## 2017-07-04 DIAGNOSIS — I110 Hypertensive heart disease with heart failure: Secondary | ICD-10-CM | POA: Diagnosis not present

## 2017-07-04 DIAGNOSIS — E1165 Type 2 diabetes mellitus with hyperglycemia: Secondary | ICD-10-CM | POA: Diagnosis not present

## 2017-07-05 DIAGNOSIS — M353 Polymyalgia rheumatica: Secondary | ICD-10-CM | POA: Diagnosis not present

## 2017-07-05 DIAGNOSIS — I5032 Chronic diastolic (congestive) heart failure: Secondary | ICD-10-CM | POA: Diagnosis not present

## 2017-07-05 DIAGNOSIS — E1165 Type 2 diabetes mellitus with hyperglycemia: Secondary | ICD-10-CM | POA: Diagnosis not present

## 2017-07-05 DIAGNOSIS — M25522 Pain in left elbow: Secondary | ICD-10-CM | POA: Diagnosis not present

## 2017-07-05 DIAGNOSIS — I110 Hypertensive heart disease with heart failure: Secondary | ICD-10-CM | POA: Diagnosis not present

## 2017-07-05 DIAGNOSIS — M797 Fibromyalgia: Secondary | ICD-10-CM | POA: Diagnosis not present

## 2017-07-10 ENCOUNTER — Other Ambulatory Visit: Payer: Self-pay | Admitting: Internal Medicine

## 2017-07-12 DIAGNOSIS — M353 Polymyalgia rheumatica: Secondary | ICD-10-CM | POA: Diagnosis not present

## 2017-07-12 DIAGNOSIS — Z7951 Long term (current) use of inhaled steroids: Secondary | ICD-10-CM | POA: Diagnosis not present

## 2017-07-12 DIAGNOSIS — J45909 Unspecified asthma, uncomplicated: Secondary | ICD-10-CM | POA: Diagnosis not present

## 2017-07-12 DIAGNOSIS — E1165 Type 2 diabetes mellitus with hyperglycemia: Secondary | ICD-10-CM | POA: Diagnosis not present

## 2017-07-12 DIAGNOSIS — J479 Bronchiectasis, uncomplicated: Secondary | ICD-10-CM | POA: Diagnosis not present

## 2017-07-12 DIAGNOSIS — Z794 Long term (current) use of insulin: Secondary | ICD-10-CM | POA: Diagnosis not present

## 2017-07-12 DIAGNOSIS — Z9181 History of falling: Secondary | ICD-10-CM

## 2017-07-12 DIAGNOSIS — I5032 Chronic diastolic (congestive) heart failure: Secondary | ICD-10-CM | POA: Diagnosis not present

## 2017-07-12 DIAGNOSIS — Z7952 Long term (current) use of systemic steroids: Secondary | ICD-10-CM | POA: Diagnosis not present

## 2017-07-12 DIAGNOSIS — I110 Hypertensive heart disease with heart failure: Secondary | ICD-10-CM | POA: Diagnosis not present

## 2017-07-12 DIAGNOSIS — M25551 Pain in right hip: Secondary | ICD-10-CM | POA: Diagnosis not present

## 2017-07-12 DIAGNOSIS — M797 Fibromyalgia: Secondary | ICD-10-CM | POA: Diagnosis not present

## 2017-07-12 DIAGNOSIS — M810 Age-related osteoporosis without current pathological fracture: Secondary | ICD-10-CM | POA: Diagnosis not present

## 2017-07-12 DIAGNOSIS — M25522 Pain in left elbow: Secondary | ICD-10-CM | POA: Diagnosis not present

## 2017-07-12 DIAGNOSIS — S32511D Fracture of superior rim of right pubis, subsequent encounter for fracture with routine healing: Secondary | ICD-10-CM | POA: Diagnosis not present

## 2017-07-13 ENCOUNTER — Telehealth: Payer: Self-pay | Admitting: Internal Medicine

## 2017-07-13 NOTE — Telephone Encounter (Signed)
FYI

## 2017-07-13 NOTE — Telephone Encounter (Unsigned)
Copied from [place] [id number]. Topic: Quick Communication - See Telephone Encounter >> Jul 13, 2017  2:36 PM Neva Seat wrote: [place]  Pt has refused PT visit this week due to her orthopedic dr says she has a sprain.

## 2017-07-15 ENCOUNTER — Other Ambulatory Visit: Payer: Self-pay | Admitting: Internal Medicine

## 2017-07-15 NOTE — Telephone Encounter (Signed)
Copied from [place]. Topic: Quick Communication - Rx Refill/Question >> Jul 15, 2017 11:17 AM Antonieta Iba C wrote: Medication: carvedilol (COREG) 6.25 MG tablet   Has the patient contacted their pharmacy? No   (Agent: If no, request that the patient contact the pharmacy for the refill.)  Preferred Pharmacy (with phone number or street name): [id number] - [place], [place]: Please be advised that RX refills may take up to 3 business days. We ask that you follow-up with your pharmacy.

## 2017-07-16 NOTE — Telephone Encounter (Signed)
Carvedilol refill Last OV:12/01/16 Last filled:08/02/2014 by historical provider PCP: [place]: [id number] [address], [place] [phone] (Phone) [phone] (Fax)

## 2017-07-17 ENCOUNTER — Other Ambulatory Visit: Payer: Self-pay | Admitting: Internal Medicine

## 2017-07-18 MED ORDER — CARVEDILOL 6.25 MG PO TABS: 3.1250 mg | ORAL_TABLET | Freq: Two times a day (BID) | ORAL | 5 refills | Status: DC

## 2017-07-25 ENCOUNTER — Telehealth: Payer: Self-pay

## 2017-07-25 DIAGNOSIS — M25512 Pain in left shoulder: Secondary | ICD-10-CM | POA: Diagnosis not present

## 2017-07-25 DIAGNOSIS — M25562 Pain in left knee: Secondary | ICD-10-CM | POA: Diagnosis not present

## 2017-07-25 NOTE — Telephone Encounter (Signed)
Recd call from dr Rip Harbour at [facility] feels patient needs appt with dr crawford this week, probably a social type visit to evaluate reoccurent falls---there are some descrepancies between what grandson says he is seeing and what patient is reporting as happening---Dr. Rip Harbour has requested we call her grandson, Burt Ek at [phone] to schedule appt for this week with patient to address this issue---routing to dr Sharlet Salina, Juluis Rainier.....routing to scheduling, can you please call grandson, Elta Guadeloupe to schedule appt this week for patient evaluation

## 2017-07-26 DIAGNOSIS — M797 Fibromyalgia: Secondary | ICD-10-CM | POA: Diagnosis not present

## 2017-07-26 DIAGNOSIS — I110 Hypertensive heart disease with heart failure: Secondary | ICD-10-CM | POA: Diagnosis not present

## 2017-07-26 DIAGNOSIS — M353 Polymyalgia rheumatica: Secondary | ICD-10-CM | POA: Diagnosis not present

## 2017-07-26 DIAGNOSIS — E1165 Type 2 diabetes mellitus with hyperglycemia: Secondary | ICD-10-CM | POA: Diagnosis not present

## 2017-07-26 DIAGNOSIS — I5032 Chronic diastolic (congestive) heart failure: Secondary | ICD-10-CM | POA: Diagnosis not present

## 2017-07-26 DIAGNOSIS — M25522 Pain in left elbow: Secondary | ICD-10-CM | POA: Diagnosis not present

## 2017-07-27 NOTE — Telephone Encounter (Signed)
Appointment scheduled.

## 2017-07-28 DIAGNOSIS — E1165 Type 2 diabetes mellitus with hyperglycemia: Secondary | ICD-10-CM | POA: Diagnosis not present

## 2017-07-28 DIAGNOSIS — M353 Polymyalgia rheumatica: Secondary | ICD-10-CM | POA: Diagnosis not present

## 2017-07-28 DIAGNOSIS — I110 Hypertensive heart disease with heart failure: Secondary | ICD-10-CM | POA: Diagnosis not present

## 2017-07-28 DIAGNOSIS — I5032 Chronic diastolic (congestive) heart failure: Secondary | ICD-10-CM | POA: Diagnosis not present

## 2017-07-28 DIAGNOSIS — M797 Fibromyalgia: Secondary | ICD-10-CM | POA: Diagnosis not present

## 2017-07-28 DIAGNOSIS — M25522 Pain in left elbow: Secondary | ICD-10-CM | POA: Diagnosis not present

## 2017-07-29 ENCOUNTER — Ambulatory Visit (INDEPENDENT_AMBULATORY_CARE_PROVIDER_SITE_OTHER): Payer: Medicare Other | Admitting: Internal Medicine

## 2017-07-29 ENCOUNTER — Encounter: Payer: Self-pay | Admitting: Internal Medicine

## 2017-07-29 VITALS — BP 120/68 | HR 74 | Temp 98.2°F | Ht 60.0 in | Wt 138.0 lb

## 2017-07-29 DIAGNOSIS — R413 Other amnesia: Secondary | ICD-10-CM

## 2017-07-29 DIAGNOSIS — Z794 Long term (current) use of insulin: Secondary | ICD-10-CM

## 2017-07-29 DIAGNOSIS — M810 Age-related osteoporosis without current pathological fracture: Secondary | ICD-10-CM

## 2017-07-29 DIAGNOSIS — E1165 Type 2 diabetes mellitus with hyperglycemia: Secondary | ICD-10-CM

## 2017-07-29 NOTE — Patient Instructions (Signed)
We will get the social worker in touch with you for the options.   Think about a nanny camera to keep watch in case of falls.

## 2017-07-29 NOTE — Assessment & Plan Note (Signed)
Continues with prolia. Her bone loss is related to chronic prednisone usage.

## 2017-07-29 NOTE — Progress Notes (Signed)
   Subjective:    Patient ID: Kayla Thompson, female    DOB: 1935/06/27, 82 y.o.   MRN: [id number]  HPI The patient is an 82 YO female coming in at the request of her family and orthopedic doctor for evaluation of her social situation and safety. She is having more problems with memory at home and falls. They are currently having someone with her all the time and this is very costly. She is getting up at night to urinate. She has fallen several times in the last several months. She currently has someone with her 24/7 but this is expensive and her granddaughter is with her and asking about other options or if medicare covers this at all. The patient does admit to some memory problems over the years but does not feel it is worsening. Her family is concerned as there has been some more confusion about these falls with stories sometimes not matching between patient and other witnesses to the falls. She is seeing orthopedics for healing. She is no longer taking ambien or any pain medication. She denies high or low sugars recently. No throw rugs or cords obstructing her path.   Review of Systems  Constitutional: Positive for activity change. Negative for appetite change, chills, fatigue, fever and unexpected weight change.  HENT: Negative.   Eyes: Negative.   Respiratory: Positive for cough and shortness of breath. Negative for chest tightness.   Cardiovascular: Negative for chest pain, palpitations and leg swelling.  Gastrointestinal: Negative for abdominal distention, abdominal pain, constipation, diarrhea, nausea and vomiting.  Musculoskeletal: Positive for arthralgias, gait problem and myalgias.  Skin: Negative.   Neurological: Positive for weakness. Negative for dizziness, facial asymmetry, light-headedness and numbness.       Memory change  Psychiatric/Behavioral: Negative.       Objective:   Physical Exam  Constitutional: She is oriented to person, place, and time. She appears well-developed  and well-nourished.  HENT:  Head: Normocephalic and atraumatic.  Eyes: EOM are normal.  Neck: Normal range of motion.  Cardiovascular: Normal rate and regular rhythm.  Pulmonary/Chest: Effort normal. No respiratory distress. She has no wheezes. She has no rales.  Lung exam stable.  Abdominal: Soft. Bowel sounds are normal. She exhibits no distension. There is no tenderness. There is no rebound.  Musculoskeletal: She exhibits tenderness. She exhibits no edema.  Neurological: She is alert and oriented to person, place, and time. Coordination abnormal.  Wheeled walker  Skin: Skin is warm and dry.  Psychiatric:  Not defensive during the visit even when discussing possible memory changes   Vitals:   07/29/17 1310  BP: 120/68  Pulse: 74  Temp: 98.2 F (36.8 C)  TempSrc: Oral  SpO2: 97%  Weight: 138 lb (62.6 kg)  Height: 5' (1.524 m)      Assessment & Plan:

## 2017-07-29 NOTE — Assessment & Plan Note (Signed)
She agrees to neuropsych testing to help [place] determine if some of her inattention to details and mild change is related to dementia or possible depression from change in her health situation. Referral to thn for social worker to talk to family about options for home care, daycare, assistance from insurance options for care. She is currently with care all the time. Has life alert.

## 2017-07-29 NOTE — Assessment & Plan Note (Signed)
Sugars stable recently.

## 2017-08-01 ENCOUNTER — Encounter: Payer: Self-pay | Admitting: Neurology

## 2017-08-02 DIAGNOSIS — I5032 Chronic diastolic (congestive) heart failure: Secondary | ICD-10-CM | POA: Diagnosis not present

## 2017-08-02 DIAGNOSIS — I110 Hypertensive heart disease with heart failure: Secondary | ICD-10-CM | POA: Diagnosis not present

## 2017-08-02 DIAGNOSIS — E1165 Type 2 diabetes mellitus with hyperglycemia: Secondary | ICD-10-CM | POA: Diagnosis not present

## 2017-08-02 DIAGNOSIS — M25522 Pain in left elbow: Secondary | ICD-10-CM | POA: Diagnosis not present

## 2017-08-02 DIAGNOSIS — M797 Fibromyalgia: Secondary | ICD-10-CM | POA: Diagnosis not present

## 2017-08-02 DIAGNOSIS — M353 Polymyalgia rheumatica: Secondary | ICD-10-CM | POA: Diagnosis not present

## 2017-08-07 ENCOUNTER — Other Ambulatory Visit: Payer: Self-pay | Admitting: Internal Medicine

## 2017-08-08 DIAGNOSIS — F419 Anxiety disorder, unspecified: Secondary | ICD-10-CM | POA: Diagnosis not present

## 2017-08-08 DIAGNOSIS — F331 Major depressive disorder, recurrent, moderate: Secondary | ICD-10-CM | POA: Diagnosis not present

## 2017-08-09 DIAGNOSIS — M25462 Effusion, left knee: Secondary | ICD-10-CM | POA: Diagnosis not present

## 2017-08-09 DIAGNOSIS — M19012 Primary osteoarthritis, left shoulder: Secondary | ICD-10-CM | POA: Diagnosis not present

## 2017-08-10 DIAGNOSIS — E1165 Type 2 diabetes mellitus with hyperglycemia: Secondary | ICD-10-CM | POA: Diagnosis not present

## 2017-08-10 DIAGNOSIS — I110 Hypertensive heart disease with heart failure: Secondary | ICD-10-CM | POA: Diagnosis not present

## 2017-08-10 DIAGNOSIS — M797 Fibromyalgia: Secondary | ICD-10-CM | POA: Diagnosis not present

## 2017-08-10 DIAGNOSIS — I5032 Chronic diastolic (congestive) heart failure: Secondary | ICD-10-CM | POA: Diagnosis not present

## 2017-08-10 DIAGNOSIS — M25522 Pain in left elbow: Secondary | ICD-10-CM | POA: Diagnosis not present

## 2017-08-10 DIAGNOSIS — M353 Polymyalgia rheumatica: Secondary | ICD-10-CM | POA: Diagnosis not present

## 2017-08-17 ENCOUNTER — Other Ambulatory Visit: Payer: Self-pay | Admitting: *Deleted

## 2017-08-17 NOTE — Patient Outreach (Signed)
[place] [facility]) Care Management  08/17/2017  Kayla Thompson [birth year] [id number]  Referral from MD office: Reason: recurrent falls; want to know about resources;  Per office visit chart review: Last visit 07/29/2017  Patient has problem with memory & falls at home. Wants [facility] CSW referral for options for home care, day care.  Telephone call to patient who responded her name and asked reason for call. Patient was advised of referral & [facility] services. States she has caregiver but she takes her own calls. Patient advised that she did not need [facility] care management services and disconnected call. .   Plan: Will follow up.  Sherrin Daisy, RN BSN [place] Management Coordinator [facility] Care Management  [phone]

## 2017-08-18 DIAGNOSIS — H2511 Age-related nuclear cataract, right eye: Secondary | ICD-10-CM | POA: Diagnosis not present

## 2017-08-18 DIAGNOSIS — Z961 Presence of intraocular lens: Secondary | ICD-10-CM | POA: Diagnosis not present

## 2017-08-25 ENCOUNTER — Ambulatory Visit: Payer: Medicare Other | Admitting: Pulmonary Disease

## 2017-08-30 ENCOUNTER — Other Ambulatory Visit: Payer: Self-pay | Admitting: *Deleted

## 2017-08-30 NOTE — Patient Outreach (Signed)
[name] [facility]) Care Management  08/30/2017  Kayla Thompson 22-Apr-1935 [id number]  Referral from MD office: Reason: recurrent falls; want to know about resources;  Late entry- Telephone call made to MD office 08/26/2017. [name] was advised that patient had refused [facility] services. Patient also advised that she had caregiver, but that she took her own calls and spoke for herself. [name] took message and will have someone call back.  Plan. Close case. Can reopen as needed.  Sherrin Daisy, RN BSN [place] Management Coordinator [facility] Care Management  [phone]

## 2017-09-01 ENCOUNTER — Other Ambulatory Visit: Payer: Self-pay | Admitting: *Deleted

## 2017-09-01 NOTE — Patient Outreach (Signed)
[facility]) Care Management  09/01/2017  Kayla Thompson 1935/11/05 [id number]  Received voice message from Cecille Rubin with [facility] that she has received message regarding patient and will forward to primary care provider.   Sherrin Daisy, RN BSN [place] Management Coordinator [facility] Care Management  [phone]

## 2017-09-02 ENCOUNTER — Other Ambulatory Visit: Payer: Self-pay | Admitting: Internal Medicine

## 2017-09-14 DIAGNOSIS — H2511 Age-related nuclear cataract, right eye: Secondary | ICD-10-CM | POA: Diagnosis not present

## 2017-09-19 ENCOUNTER — Ambulatory Visit (INDEPENDENT_AMBULATORY_CARE_PROVIDER_SITE_OTHER)
Admission: RE | Admit: 2017-09-19 | Discharge: 2017-09-19 | Disposition: A | Discharge status: Home / Self Care | Payer: Medicare Other | Source: Ambulatory Visit | Attending: Pulmonary Disease | Admitting: Pulmonary Disease

## 2017-09-19 ENCOUNTER — Encounter: Payer: Self-pay | Admitting: Pulmonary Disease

## 2017-09-19 ENCOUNTER — Ambulatory Visit (INDEPENDENT_AMBULATORY_CARE_PROVIDER_SITE_OTHER): Payer: Medicare Other | Admitting: Pulmonary Disease

## 2017-09-19 VITALS — BP 130/76 | HR 77 | Temp 97.9°F | Ht 60.0 in | Wt 138.4 lb

## 2017-09-19 DIAGNOSIS — R413 Other amnesia: Secondary | ICD-10-CM | POA: Diagnosis not present

## 2017-09-19 DIAGNOSIS — J479 Bronchiectasis, uncomplicated: Secondary | ICD-10-CM | POA: Diagnosis not present

## 2017-09-19 DIAGNOSIS — I5032 Chronic diastolic (congestive) heart failure: Secondary | ICD-10-CM | POA: Diagnosis not present

## 2017-09-19 DIAGNOSIS — I10 Essential (primary) hypertension: Secondary | ICD-10-CM | POA: Diagnosis not present

## 2017-09-19 DIAGNOSIS — J398 Other specified diseases of upper respiratory tract: Secondary | ICD-10-CM

## 2017-09-19 DIAGNOSIS — J8410 Pulmonary fibrosis, unspecified: Secondary | ICD-10-CM

## 2017-09-19 DIAGNOSIS — R0902 Hypoxemia: Secondary | ICD-10-CM

## 2017-09-19 DIAGNOSIS — S2242XA Multiple fractures of ribs, left side, initial encounter for closed fracture: Secondary | ICD-10-CM | POA: Diagnosis not present

## 2017-09-19 MED ORDER — ALBUTEROL SULFATE HFA 108 (90 BASE) MCG/ACT IN AERS: 2.0000 | INHALATION_SPRAY | Freq: Four times a day (QID) | RESPIRATORY_TRACT | 11 refills | Status: DC | PRN

## 2017-09-19 NOTE — Patient Instructions (Signed)
Today we updated your med list in our EPIC system...    Continue your current medications the same...  Try to get 3 breathing treatments in every day & follow these w/ your ADVAIR twice & Spiriva once...  Continue the MUCINEX + fluids...  Today we did a follow up CXR...    We will contact you w/ the results when available...   Stay as active as possible, but be careful- no falling allowed!!!  Call for any questions...  Let's plan a follow up visit in 5-3mo, sooner if needed for problems.Marland KitchenMarland Kitchen

## 2017-09-19 NOTE — Progress Notes (Addendum)
Subjective:     Patient ID: Kayla Thompson, female   DOB: May 26, 1935, 82 y.o.   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  ~  January 07, 2015:  Initial pulmonary consult w/ SN>      89 y/o WF, referred by Kayla Thompson) for a pulmonary evaluation due to cough & dyspnea>  Kayla Thompson moved to [place] about 74mo ago to be near her daughter, granddaughter, and great-grands after her husb passed away;  Shortly after arrival she fell & was [facility] w/ fx nasal bones and left wrist (distal radius & ulna) managed by Kayla Thompson summary reviewed... She established Primary Care w/ Kayla Thompson, [place], Endocrine- Kayla Thompson... She has a Hx of pulmonary problems identified by the Pt & records from [facility] as Bronchiectasis and Asthma;  Her CC is SOB/ DOE w/ min activity & it is apparent that she is rather sedentary and a poor historian; she notes mild cough, some yellow sputum, no hemoptysis; she denies CP, palpit, or signif cardiac history...  Smoking Hx>  She is a never smoker, but had signif 2nd hand exposure from her husb...  Pulmonary Hx>  She indicates hx left lung surg 2000 for benign granuloma; hx bronchiectasis but she does not know how the Dx was established; told asthma as well ever since early 2000's but not as a child or young adult; states she had pneumonia age 51 at same time as the measles (not hosp & made full recovery w/o apparent residual resp problems); she does admit to recurrent bronchitic infections as adult requiring antibiotics & occas Pred rx... She adds a rambling hx of work-ups at [facility] (in [place] in [place] but she does not know what this was for?       Notes from Kayla Thompson in [place]> Bronchiectasis, mult pulm nodules, & mild Asthma; prev pulm nodule resected in 2000 which proved to be a granuloma; he reported that an atypical  mycobacterium was found at the time of lung bx in the past- no further details provided; seen for cough w/ yellow sput & incr dyspnea- reported to be stable on Advair500Bid, Spiriva daily, Singulair10, NEB w/ Albut-Bid, Mucinex, VentolinHFA rescue prn; she was also on Pred 3mg /d for PMR from her Rheum;   Medical Hx>  Diastolic Dysfunction on 2DEcho, mild AS, HL, IDDM, Hypothyroid, GERD, DJD w/ TKR and ankle surg; Fibromyalgia on Lyrica/ Percocet/ Tramadol; PMR per Rheum in [place] on Pred2mg /d x 58yr (it was last dropped from 3mg  ~60yr ago); Osteoporosis; Anemia; Anxiety.   Family Hx>  Mother died from lung cancer (as did one of her sisters);  Father passed from heart disease.  Occup Hx>  No known occupational exposures  Current Meds>  SEE FULL MED LIST BELOW EXAM showed Afeb, VSS, O2sat=99% on RA;  Heent- neg, mallampati3;  Chest- decr BS bilat, not congested, w/o w/r/r heard;  Heart- RR gr1/6SEM w/o r/g detected;  Abd- soft, nontender, +panniculus;  Ext- VI w/o c/c/e...   CT Chest 11/17/09 in [place]> mult noncalcif pulm nodules bilat (stable when compared to older scans), mucoid impaction LUL & RML, pleuroparenchymal scarring right base (unchanged), no adenopathy, norm heart size & coronary calcif.  PFTs 02/04/10 in [place]> FVC=2.20 (95%), FEV1=1.42 (88%), %1sec=65, mid-flows reduced at 38% predicted; no improvement in FEV1 after bronchodil; TLC=4.70 (118%), RV=2.48 (153%), RV/TLC=53%; DLCO=54% predicted.  CXR 01/14/14 reported to show norm heart  size, clear lungs, thoracic spondylosis, NAD...   CXR 08/02/14 in ER here after fall showed norm heart size, diffuse interstitial coarsening w/o focal airsp dis...   2DEcho 08/02/14 in [place] after fall showed norm LVF w/ EF=55-60%, no regional wall motion abn, Gr1 DD, mild AS w/ mean gradient 48mmHg; mod calcif MV leaflets w/o stenosis or regurg; RV looked OK but could not est PAsys...  CXR here 01/07/15 showed norm heart size, incr AP diameter of chest & flat  diaphragms, incr markings diffusely, old right rib fxs, osteopenia...   Spirometry 01/07/15> we attempted mult trial of simple spirometry but pt could not generate an acceptable curve & results are not reliable...  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 We will follow closely.    Cardiac> on Coreg6.25Bid for ?HBP; she has mild AS & DiastCHF on 2DEcho and this needs follow up...    Endocrine> IDDM, Hypothyroid, transiently elev serum calcium ?etiology (resolved spont); managed by Kayla Thompson; BS today is 45 & asked to eat & check w/ endocrine re- adjustment in Novolog...    GYN> Hx ovarian cancer (in remission- details unknown), s/p hyst    Ortho/ Rheum> DJD w/ TKR, FM on Lyrica etc, severe LBP requires epidural shots; PMR on Pred (details unknown); her Sed=52 and she will prob need Rheum to help sort this out...    Heme> Hg 10-11 range May2016 after fall w/ fx nose & wrist; similar now & may need further anemia work-up as well...  ADDENDUM>>  Hi-res CT Chest 01/14/15:  Norm heart size, atherosclerosis of Ao/ great vessels/ coronaries, no adenopathy, atrophic thyroid; ?small sliding pulm hernia betw the lat 7th & 8th ribs w/ scarring; extensive bronchiectasis bilat w/ mucoid impactions within dilated airways (infectious/inflamm bronchiolitis- r/o MAI), 1mm LLL  nodule, tracheobronchomalacia w/ extensive bilat air trapping on expiration, smallHH, old healed right 6th-7th rib fx, DJD Tspine...  REC> continue NEBS, Advair, Spiriva, Mucinex (maximize to 600mg  Qid w/ fluids), practice purse lip breathing to splint the airways open & aide in expectoration of phlegm, use FLUTTER valve & postural drainage to get the sput out; collect sput for routine C&S, AFB, Fungi => TC w/ pt: she has a lot going on, refuses further meds, refuses further eval, can't be bothered w/ learning Chest PT/ Flutter, postural drainage, purse lip breathing, collecting sputum specimen, etc; states she's had all this a long time & not interested in more aggressive pulmonary treatments at this time... She will f/u w/ me in November & we will discuss again.  ADDENDUM>>  Records from Kayla Thompson, Pulmonary in [place]=> note of 07/18/13:  Hx mild asthma & bronchiectasis, hx mult pulm nodules followed for several yrs & one removed surgically in 2000 that proved to be a granuloma & report of an atypical mycobacterium being found at the same time (but no details given), on NEB w/ Albuterol,  Advair, Spiriva, Singulair, Mucinex, low-dose Pred for PMR;  She was given all indicated vaccinations;  No change in her med regimen for yrs...   ~  February 19, 2015:  6wk ROV w/ SN>  SEE ABOVE-- pt notes nasal congestion, chest congestion, sl cough & incr SOB for the last wk or so; in the next breath she says "I seldom get a cold", thinks it's all allergies, notes great-grnads recently ill;  She has NEBS w/ Albut (only using once per day), Advair500Bid, Spiriva once daily, Mucinex (?how much), Pred?1mg - 3/d, Singulair10/ Zyrtek10...  Since she was here last she has followed up w/ PCP-DrCrawford 10/25> FM doing ok on Lyrica & she reported more functional & resting better...   She also had a f/u w/ Endocrine-Kayla Thompson 11/8> DM, osteoporosis, hypothyroid, & Hx hyperpara; Labs reviewed & ok (A1c=6.5 on her insulin,  Ca is wnl and vitD=66)...    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     Cardiac> on Coreg6.25Bid for ?HBP; she has mild AS & DiastCHF on 2DEcho and this needs follow up w/ her PCP vs Cards...    Endocrine> IDDM, Hypothyroid, transiently elev serum calcium ?etiology (resolved spont); managed by Kayla Thompson...    GYN> Hx ovarian cancer (in remission- details unknown), s/p hyst & Ca125=9 (08/2014)...    Ortho/ Rheum> DJD w/ TKR, FM on Lyrica, Tramadol, Percocet,etc, severe LBP requires epidural shots; PMR on Pred (details unknown); her Sed=52 and she will prob need Rheum to help sort this out...    Heme> Hg 10-11 range May2016 after fall w/ fx nose & wrist; similar now & may need further anemia work-up as well... EXAM showed Afeb, VSS, O2sat=93% on RA;  Wt=162#;  Heent- neg, mallampati3;  Chest- decr BS bilat, not congested, w/o w/r/r heard;  Heart- RR gr1/6SEM w/o r/g detected;  Abd- soft, nontender, +panniculus;  Ext- VI w/o c/c/e...   Pt given a neb treatment in office- unable to produce any sput for cultures... IMP/PLAN>>  We discussed Rx w/ ZPak for her URI (it could also help her if she has atyp-mycobacterium); we compromised rx w/ NEBS TID- followed by AdvairBid & Spiriva once daily, plus the MUCINEX1200mg Bid w/ fluids;  She again declines the added benefit from a vigorous chest PT regimen, flutter valve, postural drainage, purse lip breathing, etc... we plan a recheck in 3 months...  ~  June 23, 2015:  75mo ROV  w/ SN>  Kayla Thompson returns unaccompanied and states that her breathing is good- no problem she says; notes min cough, sm amt whitish sput (no color, no blood), admits to mild SOB/DOE but swears there is no progression, and denies CP/ f/c/s/ etc... She states that she was INTOL to ZPak given last OV but can't recall the reaction ("?didn't work for me"), doesn't want it again;  We reviewed her meds & again she is not being aggressive enough to effectively treat her bronchiectasis & mucoid impactions> eg. Only using the Albut NEBS prn & ave once Qod, only using the Mucinex 1200 once daily, she says she is using the ADVAIR500Bid, SPIRIVA once daily, SINGULAIR10, PRED1mg -2tabs  Qam ?per PCP/ ?Rheum... 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    Cardiac> on Coreg6.25- 1/2Bid for ?HBP; she has mild AS & DiastCHF on 2DEcho and this needs follow up w/ her PCP vs Cards...    Endocrine> IDDM on Lantus & Novolog (A1c=6.3), HL on Crestor10 (FLP- controlled), Hypothyroid on Synthroid50, transiently elev serum calcium ?etiology (resolved spont), osteoporosis w/ mult fxs on Prolia Q55mo; managed by Kayla Thompson & last seen 06/09/15- note reviewed...    GYN> Hx ovarian cancer (in remission- details unknown), s/p hyst & Ca125=9 (08/2014)...    Ortho/ Rheum> DJD w/ TKR, FM on Lyrica,  Tramadol, Percocet,etc, severe LBP requires epidural shots; she is on a huge dose of Lyrica; PMR on Pred (details unknown); her Sed=52 and she will prob need Rheum to help sort this out...    Neuro> on Aricept5 per DrCrawford, I wonder if she is taking any/ all of her meds properly, no care giver accompanies pt to office visits, she is ?stoic ?oblivious...    Heme> Hg 10-11 range May2016 after fall w/ fx nose & wrist; similar now & may need further anemia work-up as well, deferred to PCP... EXAM showed Afeb, VSS, O2sat=98% on RA;  Wt=161#;  Heent- neg, mallampati3;  Chest- decr BS bilat, not congested, w/o w/r/r heard;  Heart- RR gr1/6SEM w/o r/g detected;  Abd- soft, nontender, +panniculus;  Ext- VI w/o c/c/e...   LABS 2016:  Reviewed... IMP/PLAN>>  We again outlined for Kayla Thompson a vigorous home treatment regimen for her bronchiectasis & mucoid impactions including a TID regimen>  NEB w/ Albut vs Duoneb Tid, followed by her Advair500Bid & Spiriva once/d, Guaifenesin 400mg  tabs- 2Tid w/ fluids, take her low dose Pred in AM & Singulair in PM; she declines to do chest PT/ Flutter & postural drainage treatments...  ~  October 01, 2015:  42mo ROV & add-on appt requested by pt for COPD exac>  She reports that she went to a granddaughter's wedding in [place] 6/24, then spent 1wk in [place] and "caught a cold", didn't go to UC or see a physician, just "toughed it out", getting better slowly but symptoms lingering she says> head is woozy, nasal drainage, sl cough, white sput (no discoloration or blood), sl tight/ wheezy/ congested but no CP/ f/c/s, etc... She reminds me that "ZPak doesn't work for me", and she wants Doxy;  It is apparent that she is still NOT doing her Resp treatment regimen as I have prev requested & outlined for her (see below)- acts as if this is the first time she has heard this from me, she remains on Aricept5 from PCP & has not seen Neuro...we reviewed the following medical problems during today's office  visit >>     Pulmonary> Hx COPD/ Bronchiectasis/ Abn CT Chest w/ mucoid impactions, tracheobronchomalacia w/ air trapping, 10mm LLL nodule, Hx granulomatous lung dis & reported atyp mycobacterium in  the past>  See Hi-res CTChest 12/2014;  We encouraged a regular bronchodilator regimen (NEBS w/ Albut vs Duoneb Tid, ADVAIR500Bid, SPIRIVA daily), Guaifenesin 400mg -2Tid, Singulair10, Fluids, Flutter/ postural drainage/ purse-lip breathing exercises, & check sput for cultures but she could not be bothered & refused further pulm treatment/ testing sput/ etc=> still not cooperating w/ max chest physiotherapy regimen & states she only gets a sm amt of whitish sput (unable to successfully induce sput in office...    Cardiac> on Coreg6.25- 1/2Bid for ?HBP; she has mild AS & DiastCHF on 2DEcho and this needs follow up w/ her PCP vs Cards...    Endocrine> IDDM on Lantus & Novolog (A1c=6.3), HL on Crestor10 (FLP- controlled), Hypothyroid on Synthroid50, transiently elev serum calcium ?etiology (resolved spont), osteoporosis w/ mult fxs on Prolia Q64mo; managed by Kayla Thompson & last seen 06/09/15- note reviewed...    GYN> Hx ovarian cancer (in remission- details unknown), s/p hyst & Ca125=9 (08/2014)... She is followed by DrFernandez now...    Ortho/ Rheum> DJD w/ TKR, FM on Lyrica, Tramadol, Percocet,etc, severe LBP requires epidural shots; she is on a huge dose of Lyrica; PMR on Pred (details unknown); her Sed=52 and she saw [facility] for Rheum last 09/03/15- severe OA, FM, PMR, & osteoporosis on Lyrica, Pred, Percocet, & Prolia; she felt worse when out of her Lyrica    Neuro> on Aricept5 per DrCrawford, I wonder if she is taking any/ all of her meds properly, no care giver accompanies pt to office visits, she is ?stoic ?oblivious...    Heme> Hg 10-11 range May2016 after fall w/ fx nose & wrist; similar now & may need further anemia work-up as well, deferred to PCP... EXAM showed Afeb, VSS, O2sat=96% on RA;  Wt=167#;  Heent-  neg, mallampati3;  Chest- decr BS bilat, not congested, w/o w/r/r heard;  Heart- RR gr1/6SEM w/o r/g detected;  Abd- soft, nontender, +panniculus;  Ext- VI w/o c/c/e...   CXR 11/04/15 showedCOPD, scarring in right mid lung, chr changes and NAD... IMP/PLAN>>  Kayla Thompson has a URI & mild COPD exac=> we will treat w/ Doxy100Bid (her request) and Medrol dosepak;  We again reviewed the recommended treatment protocol for her bronchiectasis (it was like she heard it for the 1st time)>  NEBS Tid, followed by [facility] & SPIRIVA daily, +Guaifenesin400-2Tid + Fluids, take the low dose Pred in AM (per Rheum) and the Singulair10 in PM, work to expectorate the phlegm but she declines to do chest PT, Flutter, postural drainage, etc... we plan ROV recheck in 15mo...  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    GI> prev eval apparently in [place] pt doesn't recall, no records avail, she has Prevacid30 & Zantac150Bid listed on med list but she says just prn... EXAM showed Afeb, VSS, O2sat=97% on RA;  Wt=162#;  Heent- neg, mallampati3;  Chest- decr BS bilat, not congested, w/o w/r/r heard;  Heart- RR gr1/6SEM w/o r/g detected;  Abd- soft, nontender, +panniculus;  Ext- VI w/o c/c/e...  IMP/PLAN>>  Kayla Thompson has serious multisys dis & is managed by DrCrawford-PCP;  From the pulm standpoint she continues to downplay the severity of her lung dis & still refuses/ forgets/ etc to do her breathing treatments as we have outlined for her- REC to use NEB Tid, followed by Advair500Bid, Spiriva once dailyGFN400-2Tid w/ fluids, Singulair10, & Flutter valve/ postural drain/ purse-lip breathig treatments TID... Today we reviewed this regimen & added a Medrol Dosepak... She now appears to have signif GI-GERD/ reflux/ prob LPR & we added a vigorous antireflux regimen=> Prevacid30 taken 30 miin before dinner, NPO after dinner, elev HOB 6" blocks... She needs to see GI for further eval & needs these recommendations reinforced by them & her PCP...  ~  April 13, 2016:  40mo ROV & once again Kayla Thompson returns by herself for a f/u visit> she tells me that she is doing satis, no new complaints or concerns;  When asked about her meds she says they are all the same but she does not know the names or the dosing regimen;  She states her breathing is the same- min SOB w/o change- eg w/ walking (says lim by knee); notes mild cough/ sput- clear w/o color or blood seen, no CP;  Today's agenda involves getting a handicap placard- we filled it out for her... 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 We re-iterate the max regimen at each office visit & print it out on the AVS for family to review & help pt with...    Cardiac> on Coreg6.25- 1/2Bid for ?HBP; she has mild AS & DiastCHF on 2DEcho and this needs follow up w/ her PCP (DrCrawford) vs Cards consult ...    Endocrine> IDDM on Lantus & Novolog (A1c=6.3), HL on Crestor10 (FLP- controlled), Hypothyroid on Synthroid50, transiently elev serum calcium ?etiology (resolved spont), osteoporosis w/ mult fxs on Prolia Q67mo; managed by Kayla Thompson but last seen 06/09/15- note reviewed; she sees DrCrawford frequently...    GYN> Hx  ovarian cancer (in remission- details unknown), s/p hyst & Ca125=9 (08/2014)... She is followed by DrFernandez now...    Ortho/ Rheum> DJD w/ TKR, FM on Lyrica, Tramadol, Percocet,etc, severe LBP requires epidural shots; she is on a huge dose of Lyrica; PMR on Pred (details unknown); her Sed=52 and she saw [facility] for Rheum 03/23/16- 26mo rov for  severe OA, FM, PMR, & osteoporosis on Lyrica 300Bid, Pred 2mg /d, Percocet, & Prolia (last dose 02/24/16); she felt worse when out of her Lyrica    Neuro & Psyche> on Aricept5 per DrCrawford plus Buspar & Cymbalta- I wonder if she is taking any/ all of her meds properly, no care  giver accompanies pt to office visits, she is ?stoic ?oblivious...    Heme> Hg 10-11 range May2016 after fall w/ fx nose & wrist; similar now & may need further anemia work-up as well, deferred to PCP... EXAM showed Afeb, VSS, O2sat=95% on RA;  Wt=160#;  Heent- neg, mallampati3;  Chest- decr BS bilat, not congested, w/o w/r/r heard;  Heart- RR gr1/6SEM w/o r/g detected;  Abd- soft, nontender, +panniculus;  Ext- VI w/o c/c/e...   Ambulatory Oximetry 04/13/16>  O2sat=92% on RA at rest w/ pulse= 80/min... She ambulated 1 Lap in office (185') w/ O2sat drop to 84% with pulse=89/min... IMP/PLAN>>  Zayonna has exercise induced hypoxemia & needs a POC w/ O2 at 2L/min w/ activity;  We will check an ONO in the interim to see if she needs nocturnal O2 as well;  In the meanwhile we once again reviewed w/ the pt regarding her MAX respiratory regimen-- Albut via NEB Tid, followed by Advair500Bid & Spiriva daily; continue GFN 400mg -2Tid w/ fluids; on Pred 2mg /d by rheum for PMR; on Singulair10 & flonase;  We also reviewed her antireflux regimen w/ Prevacid30 taken 30 min before dinner, NPO after dinner, elev HOB 6"... we plan another recheck in 43mo...  ~  July 12, 2016:  4mo ROV & Kayla Thompson is here by herself again today- no complaints or concerns- states her breathing is good, she uses her portable O2 as needed (she doesn't like the O2 conc- makes too much noise, gives her migraines, etc), denies CP/ palpt/ SOB/ edema; notes min cough small amt whitish sput, no hemoptysis; denies f/c/s and says she is active & exercising on her own (does not go to gym etc- we discussed silver sneakers etc...    Her PCP is DrCrawford, requested referral to Urology for urinary incont, given handicap placard for parking, wants a new eye doctor due to droopy eyelids (rec [facility]), pt says that [facility] checked her anemia- "it's good, not to worry"... We reviewed the following medical problems during today's office visit >>     Pulmonary> Hx COPD/  Bronchiectasis/ Abn CT Chest w/ mucoid impactions, tracheobronchomalacia w/ air trapping, 55mm LLL nodule, Hx granulomatous lung dis & reported atyp mycobacterium in the past>  See Hi-res CTChest 12/2014;  We encouraged a regular bronchodilator regimen (NEBS w/ Albut vs Duoneb Tid, ADVAIR500Bid, SPIRIVA daily), Guaifenesin 400mg -2Tid, Singulair10, Fluids, Flutter/ postural drainage/ purse-lip breathing exercises, & check sput for cultures but she could not be bothered & refused further pulm treatment/ testing sput/ etc=> still not cooperating w/ max chest physiotherapy regimen & states she only gets a sm amt of whitish sput (unable to successfully induce sput in office... We re-iterate the max regimen at each office visit & print it out on the AVS for family to review & help pt with...    Cardiac> on Coreg6.25-  1/2Bid for ?HBP; she has mild AS & DiastCHF on 2DEcho and this needs follow up w/ her PCP (DrCrawford) vs Cards consult ...    Endocrine> IDDM on Lantus & Novolog (A1c=6.3), HL on Crestor10 (FLP- controlled), Hypothyroid on Synthroid50, transiently elev serum calcium ?etiology (resolved spont), osteoporosis w/ mult fxs on Prolia Q11mo; managed by Kayla Thompson but last seen 06/09/15- note reviewed; she sees DrCrawford frequently...    GYN> Hx ovarian cancer (in remission- details unknown), s/p hyst & Ca125=9 (08/2014)... She is followed by DrFernandez now...    Ortho/ Rheum> DJD w/ TKR, FM on Lyrica, Tramadol, Percocet,etc, severe LBP requires epidural shots; she is on a huge dose of Lyrica; PMR on Pred (details unknown); her Sed=52 and she saw [facility] for Rheum 03/23/16- 108mo rov for  severe OA, FM, PMR, & osteoporosis on Lyrica 300Bid, Pred 2mg /d, Percocet, & Prolia (last dose 02/24/16); she felt worse when out of her Lyrica    Neuro & Psyche> on Aricept5 per DrCrawford plus Buspar & Cymbalta- I wonder if she is taking any/ all of her meds properly, no care giver accompanies pt to office visits, she is ?stoic  ?oblivious...    Heme> Hg 10-11 range May2016 after fall w/ fx nose & wrist; similar now & may need further anemia work-up as well, deferred to PCP... EXAM showed Afeb, VSS, O2sat=97% on RA;  Wt=151#;  Heent- neg, mallampati3;  Chest- decr BS bilat, not congested, w/o w/r/r heard;  Heart- RR gr1/6SEM w/o r/g detected;  Abd- soft, nontender, +panniculus;  Ext- VI w/o c/c/e...   Ambulatory Oximetry 07/12/16>  O2sat=94% on RA at rest w/ pulse=97/min;  She ambulated 2 laps in office (185'ea) w/ lowest O2sat=88% w/ pulse=112/min;  This is improved from [id number];  Advised O2 at 2L/min w/ activities...  IMP/PLAN>>  Pt is asked to use O2 w/ ambulation & reminded again about the recommended pulm Rx- NEBS w/ Albut vs Duoneb Tid, ADVAIR500Bid, SPIRIVA daily, Guaifenesin 400mg -2Tid, Singulair10, Fluids, Flutter/ postural drainage/ purse-lip breathing exercises; call for any breathing issues, rov ~32mo...  ~  December 01, 2016:  62mo ROV & Kayla Thompson is here by herself again today (daughter brought her but is waiting in the car per pt)>  She was referred by DrCrawford for pulm eval due to cough & dyspnea, records from [facility] indicated a hx of asthma/ bronchiectasis/ mult pulm nodules, she had left lung surg in 2000 for a benign granuloma & there was a question of an atypical mycobacterium; she is rather sedentary & poorly conditioned; she is a never smoker w/ 2nd hand smoke exposure;  CT Chest 10/16 showed> Norm heart size, atherosclerosis of Ao/ great vessels/ coronaries, no adenopathy, atrophic thyroid; ?small sliding pulm hernia betw the lat 7th & 8th ribs w/ scarring; extensive bronchiectasis bilat w/ mucoid impactions within dilated airways (infectious/inflamm bronchiolitis- r/o MAI), 80mm LLL nodule, tracheobronchomalacia w/ extensive bilat air trapping on expiration, [place], old healed right 6th-7th rib fx, DJD Tspine... She has demonstrated exercise hypoxemia & never did the requested ONO- she refuses to use her Port O2  stating "I have allergies" and uses it prn only; she lives w/ daughter but she never comes back to the exam room w/ her mother during the OVs; pt says she rests well, not SOB, breathing well & denies cough/ sput/ hemoptysis, CP, f/c/s, etc... We have stressed a MAX bronchodil regimen w/ NEBS w/ Albut vs Duoneb Tid, ADVAIR500Bid, SPIRIVA daily, Guaifenesin 400mg -2Tid, Singulair10, Fluids, Flutter/ postural drainage/ purse-lip breathing exercises and an antireflux  regimen...     She has numerous medical specialists per Epic review>  Ophthalmology- DrShapiro, sent to oculofacial plastic surg DrWhite, Endocrine- Kayla Thompson, Ivor, PCP- DrCrawford, on Prolia q33mo... EXAM showed Afeb, VSS, O2sat=100% on RA;  Wt=144#;  Heent- neg, mallampati3;  Chest- decr BS bilat, not congested, w/o w/r/r heard;  Heart- RR gr1/6SEM w/o r/g detected;  Abd- soft, nontender, +panniculus;  Ext- VI w/o c/c/e...  IMP/PLAN>>  She is reminded about her mandatory pulm regimen & this is again written for her to share w/ family/ care givers but she remains unconcerned, indifferent, not motivated...   ~  February 24, 2017:  15mo ROV & add-on appt> pt has returned for oxygen recertification per [employer]> Their letter indicates that a new CMN is needed after 2mo of service & requires re-eval within 90d of the new medicare oxygen CMN... SEE ABOVE NOTES-- She was referred by DrCrawford for pulm eval due to cough & dyspnea, records from [facility] indicated a hx of asthma/ bronchiectasis/ mult pulm nodules, she had left lung surg in 2000 for a benign granuloma & there was a question of an atypical mycobacterium; she is rather sedentary & poorly conditioned; she is a never smoker w/ 2nd hand smoke exposure;  CT Chest 10/16 showed> Norm heart size, atherosclerosis of Ao/ great vessels/ coronaries, no adenopathy, atrophic thyroid; ?small sliding pulm hernia betw the lat 7th & 8th ribs w/ scarring; extensive bronchiectasis bilat w/ mucoid impactions  within dilated airways (infectious/inflamm bronchiolitis- r/o MAI), 63mm LLL nodule, tracheobronchomalacia w/ extensive bilat air trapping on expiration, [place], old healed right 6th-7th rib fx, DJD Tspine... She has demonstrated exercise hypoxemia & never did the requested ONO- she refuses to use her Port O2 stating "I have allergies" and uses it prn only; she lives w/ daughter but she never comes back to the exam room w/ her mother during the OVs; pt says she rests well, not SOB, breathing well & denies cough/ sput/ hemoptysis, CP, f/c/s, etc... We have stressed a MAX bronchodil regimen w/ NEBS w/ Albut vs Duoneb Tid, ADVAIR500Bid, SPIRIVA daily, Guaifenesin 400mg -2Tid, Singulair10, Fluids, Flutter/ postural drainage/ purse-lip breathing exercises and an antireflux regimen...  EXAM showed Afeb, VSS, O2sat=100% on RA;  Wt=144#;  Heent- neg, mallampati3;  Chest- decr BS bilat, not congested, w/o w/r/r heard;  Heart- RR gr1/6SEM w/o r/g detected;  Abd- soft, nontender, +panniculus;  Ext- VI w/o c/c/e...   Ambulatory oximetry 02/24/17>  O2sat=96% on RA at rest w/ pulse=87/min;  She ambulated 3 laps (185'ea) in the office w/ nadir O2sat=88% w/ pulse=113/min IMP/PLAN>>  She has an O2 conc at home & not using it, won't use it at night (too noisey, the cord is yoo long, she's NEVER usedit she says);  [employer]- uses "as needed" "I like it" she says, averages once per week "to give myself a breather";  She didn't take it on her trip to [facility], not using it when out & about, uses it some when doing PT exercises... She is again reminded to use the O2 at 1L/min Qhs & should check w/ DME [employer] regarding the cord & machine noise;  Advised to use the O2 at 2L/min w/ ambulation;  Continue Rx w/ NEB rx Tid followed by Advair250Bid & Spiriva once daily, mucinex 1200Bid + fluids, and the antireflux regimen- Axid150 ~28min before dinner, NPO after dinner, elev HOB 6", ok Zantac150 at bedtime w/ sip of water... We plan ROV in  65mo w/ CXR...  ~  May 18, 2017:  73mo ROV & add-on appt requested for increased cough, brown mucus, chest soreness>  Kayla Thompson returns for an add-on appt w/ her daughter from [place] c/o several days hx cogh, yellow brown mucus, incr SOB, etc;  She fell 6wks ago w/ ER eval after injuring lower left ribs & laceration at left elbow;  CXR/Ribs 04/07/17 showed no acute fx, stable chr changes in both lungs- NAD;  Placed on Doxy & given hydrocodone for prn use...     [place] says she knows her meds and is great w/ the dosages; they have upcoming appt at the wound care clinic due to the elbow laceration healing...    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  EXAM showed Afeb, VSS, O2sat=96% on RA;  Wt=131#;  Heent- neg, mallampati3;  Chest- decr BS bilat, not congested, w/o w/r/r heard;  Heart- RR gr1/6SEM w/o r/g detected;  Abd- soft, nontender, +panniculus;  Ext- VI w/o c/c/e...   LABS 05/18/17>  Chems- ok x BS=37 (asked to eat ASAP), Cr=1.01, LFTs- wnl;  CBC- Hg=12.6, WBC=20.1;  Sed=23  Sputum C&S> she was unable to collect a specimen while here in the office... IMP/PLAN>>  Kayla Thompson has a COPD exac w/ yellow brown mucus and WBC ct of 20K-- we deiced to treat w/ LEVAQUIN 750mg  one daily x 7d, and give her a Depo shot & Medrol dosepak for the bronchial inflamm;  We are going to send in new rx for DUONEB to [place] ask her (again) to do this TID regularly followed by the Advair bid & Spiriva Qd;  She will continue the [facility] 1200mg  Bid, fluids, and use the IS Tid as part of her breathing treatments  ~  June 14, 2017:  39mo ROV & Kayla Thompson returns having fallen again, this time w/ a pelvic fx & presents in wheelchair; she  has had XRays and CT scan by Ortho- DrMcKinley ([facility]- PCP & sports med) but we do not have notes from them;  She has Hx of COPD/ Bronchiectasis/ Abn CT Chest w/ mucoid impactions, tracheobronchomalacia w/ air trapping, 25mm LLL nodule, granulomatous lung dis & 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     She notes min cough/ sput/ DOE w/ min activ & getting PT at home; prev treated w/ Levaquin & Medrol dosepak=> she states improved back to baseline... EXAM showed Afeb, VSS, O2sat=95% on RA;  Wt=137#;  Heent- neg, mallampati3;  Chest- decr BS bilat, not congested, w/o w/r/r heard;  Heart- RR gr1/6SEM w/o r/g detected;  Abd- soft, nontender, +panniculus;  Ext- VI w/o c/c/e...  IMP/PLAN>>  Again reminded to be diligent w/ her bronchodilator regimen, Mucinex, fluids, IS/flutter;  We plan recheck in 37mo...   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  She denies SOB "except after walking 2 blocks", denies CP, denies f/c/s... Her PCP is DrCrawford and last medical note was 07/29/17, reviewed... Pt refused [facility] services...    EXAM showed Afeb, VSS, O2sat=95% on RA;  Wt=137#;  Heent- neg, mallampati3;  Chest- decr BS bilat, not congested, w/o w/r/r heard;  Heart- RR gr1/6SEM w/o r/g detected;   Abd- soft, nontender, +panniculus;  Ext- VI w/o c/c/e...   CXR 09/19/17 (independently reviewed by me in the PACS system) showed norm heart size, clear lungs- NAD, healing bilat rib fxs...  IMP/PLAN>>  Kayla Thompson remains stable on her NEBULIZER w/ Duoneb (using it Bid) followed by the Advair500Bid & Spiriva once daily;  She has NOT been using her Oxygen at all "I'm fine" she says & "I haven't needed a breather" (she prev refused to do an ONO & qualified for port O2 w/ desat to 88% while walking);  She is asked to let me know if she feels increasingly short of breath etc, and asked to maintain a regular exercise program...     Past Medical History:  Diagnosis Date  . Arthritis   . Asthma   . Diabetes mellitus without complication ([place])   . Fibromyalgia   . GERD (gastroesophageal reflux disease)   . History of chemotherapy   . History of fractured pelvis   . Hypertension   . Osteoporosis   . Osteoporosis   . Ovarian cancer [facility])     Past Surgical History  Procedure Laterality Date  . Hip surgey    . Knee surgey    . Lung surgery >> resection of nodule 2000, proved to be a benign granuloma & ?atyp mycobacterium ident at the same time?    . Bladder suspension    . Ankle fracture surgery      plate and screws  . Abdominal hysterectomy  1996    Outpatient Encounter Medications as of 09/19/2017  Medication Sig  . albuterol (PROVENTIL HFA;VENTOLIN HFA) 108 (90 Base) MCG/ACT inhaler Inhale 2 puffs into the lungs every 6 (six) hours as needed for wheezing or shortness of breath.  . Apoaequorin (PREVAGEN) 10 MG CAPS Take 10 m by mouth daily.  . BD PEN NEEDLE NANO U/F 32G X 4 MM MISC USE 4 TIMES A DAY  . carvedilol (COREG) 6.25 MG tablet Take 0.5 tablets (3.125 mg total) by mouth 2 (two) times daily with a meal.  . denosumab (PROLIA) 60 MG/ML SOLN injection Inject 60 mg into the skin every 6 (six) months. Administer in upper arm, thigh, or abdomen  . Dextromethorphan-Guaifenesin (MUCINEX DM  MAXIMUM STRENGTH) 60-1200 MG TB12 Take 1 tablet by mouth 2 (two) times daily as needed (for cough/phlegm). Reported on 06/23/2015  . donepezil (ARICEPT) 5 MG tablet TAKE 1 TABLET (5 MG TOTAL) BY MOUTH AT BEDTIME.  . DULoxetine HCl (CYMBALTA PO) Take 90 mg by mouth daily.  Marland Kitchen glucose blood (FREESTYLE TEST STRIPS) test strip Use to test blood sugar 3 times daily. Dx: E11.65  . insulin lispro (HUMALOG KWIKPEN) 100 UNIT/ML KiwkPen Inject 4-6 units into the skin daily.  Marland Kitchen ipratropium-albuterol (DUONEB) 0.5-2.5 (3) MG/3ML SOLN Take 3 mLs by nebulization 3 (three) times daily.  . Lancets (FREESTYLE) lancets Use to test blood sugar 3  times daily. Dx: E11.65  . LANTUS SOLOSTAR 100 UNIT/ML Solostar Pen INJECT 30 UNITS SUBCUTANEOUSLY EVERY MORNING  . levothyroxine (SYNTHROID, LEVOTHROID) 50 MCG tablet Take 1 tablet (50 mcg total) by mouth daily before breakfast.  . montelukast (SINGULAIR) 10 MG tablet TAKE 1 TABLET (10 MG TOTAL) BY MOUTH DAILY.  Marland Kitchen predniSONE 2 MG TBEC Take 2 mg by mouth daily.  . ranitidine (ZANTAC) 150 MG tablet Take 150 mg by mouth 2 (two) times daily as needed for heartburn.  . rosuvastatin (CRESTOR) 10 MG tablet TAKE 1 TABLET (10 MG TOTAL) BY MOUTH EVERY EVENING.  . SPIRIVA HANDIHALER 18 MCG inhalation capsule PLACE 1 CAPSULE INTO INHALER AND INHALE AS DIRECTED EVERY DAY  . trimethoprim (TRIMPEX) 100 MG tablet Take 100 mg by mouth daily.  Grant Ruts INHUB 500-50 MCG/DOSE AEPB INHALE 1 PUFF BY MOUTH TWICE A DAY  . [DISCONTINUED] albuterol (PROVENTIL HFA;VENTOLIN HFA) 108 (90 Base) MCG/ACT inhaler Inhale 2 puffs into the lungs every 6 (six) hours as needed for wheezing or shortness of breath.  . [DISCONTINUED] pregabalin (LYRICA) 100 MG capsule Take 1 capsule (100 mg total) by mouth 2 (two) times daily.   No facility-administered encounter medications on file as of 09/19/2017.     Allergies  Allergen Reactions  . Latex Rash  . Penicillins Rash     Immunization History  Administered  Date(s) Administered  . Influenza, High Dose Seasonal PF 12/01/2016  . Influenza,inj,Quad PF,6+ Mos 01/17/2015, 12/18/2015  . Pneumococcal Polysaccharide-23 12/18/2015  . Tdap 08/02/2014, 04/07/2017    Current Medications, Allergies, Past Medical History, Past Surgical History, Family History, and Social History were reviewed in [employer] record.   Review of Systems             All symptoms NEG except where BOLDED >>  Constitutional:  F/C/S, fatigue, anorexia, unexpected weight change. HEENT:  HA, visual changes, hearing loss, earache, nasal symptoms, sore throat, mouth sores, hoarseness. Resp:  cough, sputum, hemoptysis; SOB, tightness, wheezing. Cardio:  CP, palpit, DOE, orthopnea, edema. GI:  N/V/D/C, blood in stool; reflux, abd pain, distention, gas. GU:  dysuria, freq, urgency, hematuria, flank pain, voiding difficulty. MS:  joint pain, swelling, tenderness, decr ROM; neck pain, back pain, etc. Neuro:  HA, tremors, seizures, dizziness, syncope, weakness, numbness, gait abn. Skin:  suspicious lesions or skin rash. Heme:  adenopathy, bruising, bleeding. Psyche:  confusion, agitation, sleep disturbance, hallucinations, anxiety, depression suicidal.   Objective:   Physical Exam       Vital Signs:  Reviewed...   General:  WD, WN, 82 y/o WF in NAD; alert & oriented; pleasant & cooperative... HEENT:  [place]/AT; Conjunctiva- pink, Sclera- nonicteric, EOM-wnl, PERRLA, EACs-clear, TMs-wnl; NOSE-clear; THROAT-clear & wnl.  Neck:  Supple w/ fairROM; no JVD; normal carotid impulses w/o bruits; no thyromegaly or nodules palpated; no lymphadenopathy.  Chest:  Decr BS bilat, few scat rhonchi otherw clear not congested no wheezing rales or signs of consolidation... Heart:  Regular Rhythm; norm S1 & S2 w/ Gr1/6 SEM w/o rubs or gallops detected... Abdomen:  Soft & nontender, +panniculus- no guarding or rebound; normal bowel sounds; no organomegaly or masses palpated. Ext:   decrROM; without deformities +arthritic changes; +venous insuffic, no edema;  Pulses intact w/o bruits. Neuro:  No focal neuro deficit; abn gait & balance poor, uses cane... Derm:  No lesions noted; no rash etc. Lymph:  No cervical, supraclavicular, axillary, or inguinal adenopathy palpated.   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    Cardiac> on Coreg6.25- 1/2Bid for ?HBP; she has mild AS & DiastCHF on 2DEcho and this needs follow up w/ her PCP vs Cards...    Endocrine> IDDM on Lantus & Novolog (A1c=6.3), HL on Crestor10 (FLP- controlled), Hypothyroid on Synthroid50, transiently elev serum calcium ?etiology (resolved spont), osteoporosis w/ mult fxs on Prolia Q62mo; managed by Kayla Thompson & last seen 06/09/15- note reviewed...    GI> She presented w/ symptoms of reflux/ LPR w/ cough while eating etc => needs GI eval & we will refer (see recs below)...    GYN> Hx ovarian cancer (in remission- details unknown), s/p hyst & Ca125=9 (08/2014)... She is followed by DrFernandez now...    Ortho/ Rheum> DJD w/ TKR, FM on Lyrica, Tramadol, Percocet,etc, severe LBP requires epidural shots; she is on a huge dose of Lyrica; PMR on Pred (details unknown); her Sed=52 and she saw [facility] for Rheum last 09/03/15- severe OA,  FM, PMR, & osteoporosis on Lyrica, Pred, Percocet, & Prolia; she felt worse when out of her Lyrica    Neuro> on Aricept5 per DrCrawford, I wonder if she is taking any/ all of her meds properly, no care giver accompanies pt to office visits, she is ?stoic ?oblivious...    Heme> Hg 10-11 range May2016 after fall w/ fx nose & wrist; similar now & may need further anemia work-up as well, deferred to PCP...  PLAN >>   01/19/15>   We discussed Rx w/ ZPak for her URI (it could also help her if she has atyp-mycobacterium); we compromised rx w/ NEBS TID- followed by AdvairBid & Spiriva once daily, plus the MUCINEX1200mg Bid w/ fluids;  She again declines the added benefit from a vigorous chest PT regimen, flutter valve, postural drainage, purse lip breathing, etc... 06/23/15>   We again outlined for Kayla Thompson a vigorous home treatment regimen for her bronchiectasis & mucoid impactions including a TID regimen>  NEB w/ Albut vs Duoneb Tid, followed by her Advair500Bid & Spiriva once/d, Guaifenesin 400mg  tabs- 2Tid w/ fluids, take her low dose Pred in AM & Singulair in PM; she declines to do chest PT/ Flutter & postural drainage treatments... 10/01/15>   Kayla Thompson has a URI & mild COPD exac=> we will treat w/ Doxy100Bid (her request) and Medrol dosepak;  We again reviewed the recommended treatment protocol for her bronchiectasis (it was like she heard it for the 1st time)>  NEBS Tid, followed by [facility] & SPIRIVA daily, +Guaifenesin400-2Tid + Fluids, take the low dose Pred in AM (per Rheum) and the Singulair10 in PM, work to expectorate the phlegm but she declines to do chest PT, Flutter, postural drainage, etc... we plan ROV recheck in 56mo... 12/25/15>   Kayla Thompson has serious multisys dis & is managed by DrCrawford-PCP;  From the pulm standpoint she continues to downplay the severity of her lung dis & still refuses/ forgets/  etc to do her breathing treatments as we have outlined for her- REC to use NEB Tid, followed by  Advair500Bid, Spiriva once dailyGFN400-2Tid w/ fluids, Singulair10, & Flutter valve/ postural drain/ purse-lip breathig treatments TID... Today we reviewed this regimen & added a Medrol Dosepak... She now appears to have signif GI-GERD/ reflux/ prob LPR & we added a vigorous antireflux regimen=> Prevacid30 taken 30 miin before dinner, NPO after dinner, elev HOB 6" blocks... She needs to see GI for further eval & needs these recommendations reinforced by them & her PCP. 04/13/16>   Kayla Thompson has exercise induced hypoxemia & needs a POC w/ O2 at 2L/min w/ activity;  We will check an ONO in the interim to see if she needs nocturnal O2 as well;  In the meanwhile we once again reviewe4d w/ the pt regarding her MAX respiratory regimen-- Albut via NEB Tid, followed by Advair500Bid & Spiriva daily; continue GFN 400mg -2Tid w/ fluids; on Pred 2mg /d by rheum for PMR; on Singulair10 & flonase;  We also reviewed her antireflux regimen w/ Prevacid30 taken 30 min before dinner, NPO after dinner, elev HOB 6" 07/12/16>   Pt is asked to use O2 w/ ambulation & reminded again about the recommended pulm Rx- NEBS w/ Albut vs Duoneb Tid, ADVAIR500Bid, SPIRIVA daily, Guaifenesin 400mg -2Tid, Singulair10, Fluids, Flutter/ postural drainage/ purse-lip breathing exercises; call for any breathing issues, rov ~54mo 12/01/16>   She is reminded about her mandatory pulm regimen & this is again written for her to share w/ family/ care givers but she remains unconcerned, indifferent, not motivated 02/24/17>   Sent by [employer] for O2 recert... 05/18/17>    Kayla Thompson has a COPD exac w/ yellow brown mucus and WBC ct of 20K-- we deiced to treat w/ LEVAQUIN 750mg  one daily x 7d, and give her a Depo shot & Medrol dosepak for the bronchial inflamm;  We are going to send in new rx for DUONEB to [place] ask her (again) to do this TID regularly followed by the Advair bid & Spiriva Qd;  She will continue the [facility] 1200mg  Bid, fluids, and use the IS Tid as part of  her breathing treatments 06/14/17>   Again reminded to be diligent w/ her bronchodilator regimen, Mucinex, fluids, IS/flutter;  We plan recheck in 73mo... 09/19/17>   Kayla Thompson remains stable on her NEBULIZER w/ Duoneb (using it Bid) followed by the Advair500Bid & Spiriva once daily;  She has NOT been using her Oxygen at all "I'm fine" she says & "I haven't needed a breather" (she prev refused to do an ONO & qualified for port O2 w/ desat to 88% while walking);  She is asked to let me know if she feels increasingly short of breath etc, and asked to maintain a regular exercise program...    Plan:     Patient's Medications  New Prescriptions   No medications on file  Previous Medications   APOAEQUORIN (PREVAGEN) 10 MG CAPS    Take 10 m by mouth daily.   BD PEN NEEDLE NANO U/F 32G X 4 MM MISC    USE 4 TIMES A DAY   CARVEDILOL (COREG) 6.25 MG TABLET    Take 0.5 tablets (3.125 mg total) by mouth 2 (two) times daily with a meal.   DENOSUMAB (PROLIA) 60 MG/ML SOLN INJECTION    Inject 60 mg into the skin every 6 (six) months. Administer in upper arm, thigh, or abdomen   DEXTROMETHORPHAN-GUAIFENESIN (MUCINEX DM MAXIMUM STRENGTH) 60-1200 MG TB12    Take  1 tablet by mouth 2 (two) times daily as needed (for cough/phlegm). Reported on 06/23/2015   DONEPEZIL (ARICEPT) 5 MG TABLET    TAKE 1 TABLET (5 MG TOTAL) BY MOUTH AT BEDTIME.   DULOXETINE HCL (CYMBALTA PO)    Take 90 mg by mouth daily.   GLUCOSE BLOOD (FREESTYLE TEST STRIPS) TEST STRIP    Use to test blood sugar 3 times daily. Dx: E11.65   INSULIN LISPRO (HUMALOG KWIKPEN) 100 UNIT/ML KIWKPEN    Inject 4-6 units into the skin daily.   IPRATROPIUM-ALBUTEROL (DUONEB) 0.5-2.5 (3) MG/3ML SOLN    Take 3 mLs by nebulization 3 (three) times daily.   LANCETS (FREESTYLE) LANCETS    Use to test blood sugar 3 times daily. Dx: E11.65   LANTUS SOLOSTAR 100 UNIT/ML SOLOSTAR PEN    INJECT 30 UNITS SUBCUTANEOUSLY EVERY MORNING   LEVOTHYROXINE (SYNTHROID, LEVOTHROID) 50 MCG  TABLET    Take 1 tablet (50 mcg total) by mouth daily before breakfast.   MONTELUKAST (SINGULAIR) 10 MG TABLET    TAKE 1 TABLET (10 MG TOTAL) BY MOUTH DAILY.   PREDNISONE 2 MG TBEC    Take 2 mg by mouth daily.   RANITIDINE (ZANTAC) 150 MG TABLET    Take 150 mg by mouth 2 (two) times daily as needed for heartburn.   ROSUVASTATIN (CRESTOR) 10 MG TABLET    TAKE 1 TABLET (10 MG TOTAL) BY MOUTH EVERY EVENING.   SPIRIVA HANDIHALER 18 MCG INHALATION CAPSULE    PLACE 1 CAPSULE INTO INHALER AND INHALE AS DIRECTED EVERY DAY   TRIMETHOPRIM (TRIMPEX) 100 MG TABLET    Take 100 mg by mouth daily.   WIXELA INHUB 500-50 MCG/DOSE AEPB    INHALE 1 PUFF BY MOUTH TWICE A DAY  Modified Medications   Modified Medication Previous Medication   ALBUTEROL (PROVENTIL HFA;VENTOLIN HFA) 108 (90 BASE) MCG/ACT INHALER albuterol (PROVENTIL HFA;VENTOLIN HFA) 108 (90 Base) MCG/ACT inhaler      Inhale 2 puffs into the lungs every 6 (six) hours as needed for wheezing or shortness of breath.    Inhale 2 puffs into the lungs every 6 (six) hours as needed for wheezing or shortness of breath.  Discontinued Medications   PREGABALIN (LYRICA) 100 MG CAPSULE    Take 1 capsule (100 mg total) by mouth 2 (two) times daily.

## 2017-09-21 ENCOUNTER — Telehealth: Payer: Self-pay | Admitting: Pulmonary Disease

## 2017-09-21 NOTE — Telephone Encounter (Signed)
Per SN- notify Patient of CXR results.  CXR is ok, heart size is normal, clear lungs, and bilateral old rib fracture noted. Patient notified of CXR results.  Understanding stated. Nothing further needed at this time.

## 2017-10-06 ENCOUNTER — Ambulatory Visit: Payer: Medicare Other | Admitting: Neurology

## 2017-10-09 ENCOUNTER — Inpatient Hospital Stay (HOSPITAL_BASED_OUTPATIENT_CLINIC_OR_DEPARTMENT_OTHER)
Admission: EM | Admit: 2017-10-09 | Discharge: 2017-10-12 | DRG: 871 | Disposition: A | Discharge status: Home Health | Payer: Medicare Other | Attending: Internal Medicine | Admitting: Internal Medicine

## 2017-10-09 ENCOUNTER — Emergency Department (HOSPITAL_BASED_OUTPATIENT_CLINIC_OR_DEPARTMENT_OTHER): Payer: Medicare Other

## 2017-10-09 ENCOUNTER — Other Ambulatory Visit: Payer: Self-pay

## 2017-10-09 ENCOUNTER — Encounter (HOSPITAL_BASED_OUTPATIENT_CLINIC_OR_DEPARTMENT_OTHER): Payer: Self-pay | Admitting: Emergency Medicine

## 2017-10-09 DIAGNOSIS — E1165 Type 2 diabetes mellitus with hyperglycemia: Secondary | ICD-10-CM | POA: Diagnosis present

## 2017-10-09 DIAGNOSIS — J189 Pneumonia, unspecified organism: Secondary | ICD-10-CM | POA: Diagnosis present

## 2017-10-09 DIAGNOSIS — Z7952 Long term (current) use of systemic steroids: Secondary | ICD-10-CM | POA: Diagnosis not present

## 2017-10-09 DIAGNOSIS — Y92230 Patient room in hospital as the place of occurrence of the external cause: Secondary | ICD-10-CM | POA: Diagnosis not present

## 2017-10-09 DIAGNOSIS — R05 Cough: Secondary | ICD-10-CM | POA: Diagnosis not present

## 2017-10-09 DIAGNOSIS — M25531 Pain in right wrist: Secondary | ICD-10-CM | POA: Diagnosis not present

## 2017-10-09 DIAGNOSIS — S6992XA Unspecified injury of left wrist, hand and finger(s), initial encounter: Secondary | ICD-10-CM | POA: Diagnosis not present

## 2017-10-09 DIAGNOSIS — Z6829 Body mass index (BMI) 29.0-29.9, adult: Secondary | ICD-10-CM | POA: Diagnosis not present

## 2017-10-09 DIAGNOSIS — K219 Gastro-esophageal reflux disease without esophagitis: Secondary | ICD-10-CM | POA: Diagnosis present

## 2017-10-09 DIAGNOSIS — M810 Age-related osteoporosis without current pathological fracture: Secondary | ICD-10-CM | POA: Diagnosis present

## 2017-10-09 DIAGNOSIS — Z91040 Latex allergy status: Secondary | ICD-10-CM

## 2017-10-09 DIAGNOSIS — R51 Headache: Secondary | ICD-10-CM | POA: Diagnosis present

## 2017-10-09 DIAGNOSIS — E440 Moderate protein-calorie malnutrition: Secondary | ICD-10-CM | POA: Diagnosis present

## 2017-10-09 DIAGNOSIS — Z9221 Personal history of antineoplastic chemotherapy: Secondary | ICD-10-CM | POA: Diagnosis not present

## 2017-10-09 DIAGNOSIS — W19XXXA Unspecified fall, initial encounter: Secondary | ICD-10-CM

## 2017-10-09 DIAGNOSIS — Z8543 Personal history of malignant neoplasm of ovary: Secondary | ICD-10-CM

## 2017-10-09 DIAGNOSIS — S6991XA Unspecified injury of right wrist, hand and finger(s), initial encounter: Secondary | ICD-10-CM | POA: Diagnosis not present

## 2017-10-09 DIAGNOSIS — F0390 Unspecified dementia without behavioral disturbance: Secondary | ICD-10-CM | POA: Diagnosis present

## 2017-10-09 DIAGNOSIS — Z7983 Long term (current) use of bisphosphonates: Secondary | ICD-10-CM | POA: Diagnosis not present

## 2017-10-09 DIAGNOSIS — E785 Hyperlipidemia, unspecified: Secondary | ICD-10-CM | POA: Diagnosis present

## 2017-10-09 DIAGNOSIS — J440 Chronic obstructive pulmonary disease with acute lower respiratory infection: Secondary | ICD-10-CM | POA: Diagnosis present

## 2017-10-09 DIAGNOSIS — S79911A Unspecified injury of right hip, initial encounter: Secondary | ICD-10-CM | POA: Diagnosis not present

## 2017-10-09 DIAGNOSIS — M797 Fibromyalgia: Secondary | ICD-10-CM | POA: Diagnosis present

## 2017-10-09 DIAGNOSIS — J9601 Acute respiratory failure with hypoxia: Secondary | ICD-10-CM

## 2017-10-09 DIAGNOSIS — I10 Essential (primary) hypertension: Secondary | ICD-10-CM | POA: Diagnosis present

## 2017-10-09 DIAGNOSIS — J181 Lobar pneumonia, unspecified organism: Secondary | ICD-10-CM | POA: Diagnosis present

## 2017-10-09 DIAGNOSIS — S8992XA Unspecified injury of left lower leg, initial encounter: Secondary | ICD-10-CM | POA: Diagnosis not present

## 2017-10-09 DIAGNOSIS — Z794 Long term (current) use of insulin: Secondary | ICD-10-CM | POA: Diagnosis not present

## 2017-10-09 DIAGNOSIS — W1839XA Other fall on same level, initial encounter: Secondary | ICD-10-CM | POA: Diagnosis not present

## 2017-10-09 DIAGNOSIS — I5032 Chronic diastolic (congestive) heart failure: Secondary | ICD-10-CM | POA: Diagnosis present

## 2017-10-09 DIAGNOSIS — Z79899 Other long term (current) drug therapy: Secondary | ICD-10-CM | POA: Diagnosis not present

## 2017-10-09 DIAGNOSIS — R000 Tachycardia, unspecified: Secondary | ICD-10-CM | POA: Diagnosis not present

## 2017-10-09 DIAGNOSIS — R509 Fever, unspecified: Secondary | ICD-10-CM | POA: Diagnosis not present

## 2017-10-09 DIAGNOSIS — J9621 Acute and chronic respiratory failure with hypoxia: Secondary | ICD-10-CM | POA: Diagnosis present

## 2017-10-09 DIAGNOSIS — M25562 Pain in left knee: Secondary | ICD-10-CM | POA: Diagnosis not present

## 2017-10-09 DIAGNOSIS — Z880 Allergy status to penicillin: Secondary | ICD-10-CM

## 2017-10-09 DIAGNOSIS — M542 Cervicalgia: Secondary | ICD-10-CM | POA: Diagnosis not present

## 2017-10-09 DIAGNOSIS — E039 Hypothyroidism, unspecified: Secondary | ICD-10-CM | POA: Diagnosis present

## 2017-10-09 DIAGNOSIS — A419 Sepsis, unspecified organism: Secondary | ICD-10-CM | POA: Diagnosis present

## 2017-10-09 DIAGNOSIS — M25552 Pain in left hip: Secondary | ICD-10-CM | POA: Diagnosis not present

## 2017-10-09 DIAGNOSIS — R413 Other amnesia: Secondary | ICD-10-CM | POA: Diagnosis present

## 2017-10-09 DIAGNOSIS — S8991XA Unspecified injury of right lower leg, initial encounter: Secondary | ICD-10-CM | POA: Diagnosis not present

## 2017-10-09 DIAGNOSIS — S199XXA Unspecified injury of neck, initial encounter: Secondary | ICD-10-CM | POA: Diagnosis not present

## 2017-10-09 DIAGNOSIS — Z9981 Dependence on supplemental oxygen: Secondary | ICD-10-CM

## 2017-10-09 DIAGNOSIS — S79912A Unspecified injury of left hip, initial encounter: Secondary | ICD-10-CM | POA: Diagnosis not present

## 2017-10-09 DIAGNOSIS — M25551 Pain in right hip: Secondary | ICD-10-CM | POA: Diagnosis not present

## 2017-10-09 DIAGNOSIS — Z96651 Presence of right artificial knee joint: Secondary | ICD-10-CM | POA: Diagnosis present

## 2017-10-09 DIAGNOSIS — E43 Unspecified severe protein-calorie malnutrition: Secondary | ICD-10-CM

## 2017-10-09 DIAGNOSIS — M25532 Pain in left wrist: Secondary | ICD-10-CM | POA: Diagnosis not present

## 2017-10-09 DIAGNOSIS — I110 Hypertensive heart disease with heart failure: Secondary | ICD-10-CM | POA: Diagnosis present

## 2017-10-09 DIAGNOSIS — E119 Type 2 diabetes mellitus without complications: Secondary | ICD-10-CM | POA: Diagnosis present

## 2017-10-09 DIAGNOSIS — M25561 Pain in right knee: Secondary | ICD-10-CM | POA: Diagnosis not present

## 2017-10-09 LAB — CBC WITH DIFFERENTIAL/PLATELET
Basophils Absolute: 0 10*3/uL (ref 0.0–0.1)
Basophils Relative: 0 %
Eosinophils Absolute: 0.3 10*3/uL (ref 0.0–0.7)
Eosinophils Relative: 2 %
HCT: 36.2 % (ref 36.0–46.0)
Hemoglobin: 11.8 g/dL — ABNORMAL LOW (ref 12.0–15.0)
Lymphocytes Relative: 15 %
Lymphs Abs: 3.1 10*3/uL (ref 0.7–4.0)
MCH: 34.7 pg — ABNORMAL HIGH (ref 26.0–34.0)
MCHC: 32.6 g/dL (ref 30.0–36.0)
MCV: 106.5 fL — ABNORMAL HIGH (ref 78.0–100.0)
Monocytes Absolute: 1.1 10*3/uL — ABNORMAL HIGH (ref 0.1–1.0)
Monocytes Relative: 5 %
Neutro Abs: 15.8 10*3/uL — ABNORMAL HIGH (ref 1.7–7.7)
Neutrophils Relative %: 78 %
Platelets: 271 10*3/uL (ref 150–400)
RBC: 3.4 MIL/uL — ABNORMAL LOW (ref 3.87–5.11)
RDW: 12.3 % (ref 11.5–15.5)
WBC: 20.3 10*3/uL — ABNORMAL HIGH (ref 4.0–10.5)

## 2017-10-09 LAB — COMPREHENSIVE METABOLIC PANEL
ALT: 23 U/L (ref 0–44)
AST: 18 U/L (ref 15–41)
Albumin: 3.4 g/dL — ABNORMAL LOW (ref 3.5–5.0)
Alkaline Phosphatase: 65 U/L (ref 38–126)
Anion gap: 10 (ref 5–15)
BUN: 16 mg/dL (ref 8–23)
CO2: 25 mmol/L (ref 22–32)
Calcium: 8.7 mg/dL — ABNORMAL LOW (ref 8.9–10.3)
Chloride: 100 mmol/L (ref 98–111)
Creatinine, Ser: 0.88 mg/dL (ref 0.44–1.00)
GFR calc Af Amer: >60 mL/min (ref >60)
GFR calc non Af Amer: >60 mL/min (ref >60)
Glucose, Bld: 139 mg/dL — ABNORMAL HIGH (ref 70–99)
Potassium: 4 mmol/L (ref 3.5–5.1)
Sodium: 135 mmol/L (ref 135–145)
Total Bilirubin: 0.9 mg/dL (ref 0.3–1.2)
Total Protein: 6.9 g/dL (ref 6.5–8.1)

## 2017-10-09 LAB — URINALYSIS, MICROSCOPIC (REFLEX)

## 2017-10-09 LAB — URINALYSIS, ROUTINE W REFLEX MICROSCOPIC
Bilirubin Urine: NEGATIVE
Glucose, UA: NEGATIVE mg/dL
Ketones, ur: 15 mg/dL — AB
Leukocytes, UA: NEGATIVE
Nitrite: NEGATIVE
Protein, ur: NEGATIVE mg/dL
Specific Gravity, Urine: 1.01 (ref 1.005–1.030)
pH: 6 (ref 5.0–8.0)

## 2017-10-09 LAB — TROPONIN I: Troponin I: <0.03 ng/mL (ref <0.03)

## 2017-10-09 LAB — CBG MONITORING, ED: Glucose-Capillary: 101 mg/dL — ABNORMAL HIGH (ref 70–99)

## 2017-10-09 LAB — I-STAT CG4 LACTIC ACID, ED: Lactic Acid, Venous: 1.38 mmol/L (ref 0.5–1.9)

## 2017-10-09 LAB — GLUCOSE, CAPILLARY: Glucose-Capillary: 178 mg/dL — ABNORMAL HIGH (ref 70–99)

## 2017-10-09 MED ORDER — AZITHROMYCIN 250 MG PO TABS
500.0000 mg | ORAL_TABLET | ORAL | Status: DC
  Administered 2017-10-09 – 2017-10-11 (×3): 500 mg via ORAL
  Filled 2017-10-09 (×3): qty 2

## 2017-10-09 MED ORDER — TIOTROPIUM BROMIDE MONOHYDRATE 18 MCG IN CAPS
18.0000 ug | ORAL_CAPSULE | Freq: Every day | RESPIRATORY_TRACT | Status: DC
  Administered 2017-10-10 – 2017-10-11 (×2): 18 ug via RESPIRATORY_TRACT
  Filled 2017-10-09: qty 5

## 2017-10-09 MED ORDER — DONEPEZIL HCL 10 MG PO TABS
5.0000 mg | ORAL_TABLET | Freq: Every day | ORAL | Status: DC
  Administered 2017-10-09 – 2017-10-11 (×3): 5 mg via ORAL
  Filled 2017-10-09 (×3): qty 1

## 2017-10-09 MED ORDER — SODIUM CHLORIDE 0.9% FLUSH: 3.0000 mL | INTRAVENOUS | Status: DC | PRN

## 2017-10-09 MED ORDER — DULOXETINE HCL 60 MG PO CPEP
90.0000 mg | ORAL_CAPSULE | Freq: Every day | ORAL | Status: DC
  Administered 2017-10-10 – 2017-10-12 (×3): 90 mg via ORAL
  Filled 2017-10-09 (×3): qty 1

## 2017-10-09 MED ORDER — ENSURE ENLIVE PO LIQD: 237.0000 mL | Freq: Two times a day (BID) | ORAL | Status: DC

## 2017-10-09 MED ORDER — DM-GUAIFENESIN ER 30-600 MG PO TB12
2.0000 | ORAL_TABLET | Freq: Two times a day (BID) | ORAL | Status: DC | PRN
Start: 2017-10-09 — End: 2017-10-11
  Administered 2017-10-09: 2 via ORAL
  Filled 2017-10-09 (×2): qty 2

## 2017-10-09 MED ORDER — MONTELUKAST SODIUM 10 MG PO TABS
10.0000 mg | ORAL_TABLET | Freq: Every day | ORAL | Status: DC
  Administered 2017-10-09 – 2017-10-11 (×3): 10 mg via ORAL
  Filled 2017-10-09 (×3): qty 1

## 2017-10-09 MED ORDER — ALBUTEROL SULFATE (2.5 MG/3ML) 0.083% IN NEBU
3.0000 mL | INHALATION_SOLUTION | Freq: Four times a day (QID) | RESPIRATORY_TRACT | Status: DC | PRN
Start: 2017-10-09 — End: 2017-10-12

## 2017-10-09 MED ORDER — HYDROCODONE-ACETAMINOPHEN 5-325 MG PO TABS
1.0000 | ORAL_TABLET | ORAL | Status: DC | PRN
  Administered 2017-10-10: 1 via ORAL
  Filled 2017-10-09: qty 1

## 2017-10-09 MED ORDER — ACETAMINOPHEN 325 MG PO TABS
650.0000 mg | ORAL_TABLET | Freq: Four times a day (QID) | ORAL | Status: DC | PRN
  Administered 2017-10-09 – 2017-10-10 (×3): 650 mg via ORAL
  Filled 2017-10-09 (×3): qty 2

## 2017-10-09 MED ORDER — INSULIN ASPART 100 UNIT/ML ~~LOC~~ SOLN: 0.0000 [IU] | Freq: Every day | SUBCUTANEOUS | Status: DC

## 2017-10-09 MED ORDER — ACETAMINOPHEN 650 MG RE SUPP: 650.0000 mg | Freq: Four times a day (QID) | RECTAL | Status: DC | PRN

## 2017-10-09 MED ORDER — ENOXAPARIN SODIUM 40 MG/0.4ML ~~LOC~~ SOLN
40.0000 mg | SUBCUTANEOUS | Status: DC
  Administered 2017-10-09 – 2017-10-11 (×3): 40 mg via SUBCUTANEOUS
  Filled 2017-10-09 (×3): qty 0.4

## 2017-10-09 MED ORDER — ENOXAPARIN SODIUM 40 MG/0.4ML ~~LOC~~ SOLN: 40.0000 mg | SUBCUTANEOUS | Status: DC

## 2017-10-09 MED ORDER — SODIUM CHLORIDE 0.9 % IV BOLUS
500.0000 mL | Freq: Once | INTRAVENOUS | Status: AC
Start: 2017-10-09 — End: 2017-10-09
  Administered 2017-10-09: 500 mL via INTRAVENOUS

## 2017-10-09 MED ORDER — SENNOSIDES-DOCUSATE SODIUM 8.6-50 MG PO TABS: 1.0000 | ORAL_TABLET | Freq: Every evening | ORAL | Status: DC | PRN

## 2017-10-09 MED ORDER — IPRATROPIUM-ALBUTEROL 0.5-2.5 (3) MG/3ML IN SOLN
3.0000 mL | Freq: Three times a day (TID) | RESPIRATORY_TRACT | Status: DC
  Administered 2017-10-09 – 2017-10-11 (×4): 3 mL via RESPIRATORY_TRACT
  Filled 2017-10-09 (×5): qty 3

## 2017-10-09 MED ORDER — SODIUM CHLORIDE 0.9 % IV SOLN
1.0000 g | Freq: Once | INTRAVENOUS | Status: AC
  Administered 2017-10-09: 1 g via INTRAVENOUS
  Filled 2017-10-09: qty 10

## 2017-10-09 MED ORDER — DOXYCYCLINE HYCLATE 100 MG PO TABS
100.0000 mg | ORAL_TABLET | Freq: Once | ORAL | Status: AC
  Administered 2017-10-09: 100 mg via ORAL
  Filled 2017-10-09: qty 1

## 2017-10-09 MED ORDER — INSULIN GLARGINE 100 UNIT/ML ~~LOC~~ SOLN
30.0000 [IU] | Freq: Every day | SUBCUTANEOUS | Status: DC
  Administered 2017-10-09: 30 [IU] via SUBCUTANEOUS
  Filled 2017-10-09 (×2): qty 0.3

## 2017-10-09 MED ORDER — LEVOTHYROXINE SODIUM 50 MCG PO TABS
50.0000 ug | ORAL_TABLET | Freq: Every day | ORAL | Status: DC
  Administered 2017-10-10 – 2017-10-12 (×3): 50 ug via ORAL
  Filled 2017-10-09 (×3): qty 1

## 2017-10-09 MED ORDER — CARVEDILOL 3.125 MG PO TABS
3.1250 mg | ORAL_TABLET | Freq: Two times a day (BID) | ORAL | Status: DC
  Administered 2017-10-09 – 2017-10-12 (×6): 3.125 mg via ORAL
  Filled 2017-10-09 (×6): qty 1

## 2017-10-09 MED ORDER — ONDANSETRON HCL 4 MG/2ML IJ SOLN
4.0000 mg | Freq: Four times a day (QID) | INTRAMUSCULAR | Status: DC | PRN
  Administered 2017-10-09: 4 mg via INTRAVENOUS
  Filled 2017-10-09: qty 2

## 2017-10-09 MED ORDER — CEFTRIAXONE SODIUM 1 G IJ SOLR
1.0000 g | INTRAMUSCULAR | Status: DC
  Administered 2017-10-10 – 2017-10-12 (×3): 1 g via INTRAVENOUS
  Filled 2017-10-09 (×3): qty 1

## 2017-10-09 MED ORDER — INSULIN ASPART 100 UNIT/ML ~~LOC~~ SOLN
0.0000 [IU] | Freq: Three times a day (TID) | SUBCUTANEOUS | Status: DC
  Administered 2017-10-10: 2 [IU] via SUBCUTANEOUS
  Administered 2017-10-10: 1 [IU] via SUBCUTANEOUS
  Administered 2017-10-11 – 2017-10-12 (×2): 2 [IU] via SUBCUTANEOUS

## 2017-10-09 MED ORDER — APOAEQUORIN 10 MG PO CAPS: 10.0000 | ORAL_CAPSULE | Freq: Every day | ORAL | Status: DC

## 2017-10-09 MED ORDER — ONDANSETRON HCL 4 MG PO TABS: 4.0000 mg | ORAL_TABLET | Freq: Four times a day (QID) | ORAL | Status: DC | PRN

## 2017-10-09 MED ORDER — ROSUVASTATIN CALCIUM 10 MG PO TABS
10.0000 mg | ORAL_TABLET | Freq: Every evening | ORAL | Status: DC
  Administered 2017-10-09 – 2017-10-11 (×3): 10 mg via ORAL
  Filled 2017-10-09 (×3): qty 1

## 2017-10-09 MED ORDER — FAMOTIDINE 20 MG PO TABS
20.0000 mg | ORAL_TABLET | Freq: Two times a day (BID) | ORAL | Status: DC
  Administered 2017-10-09 – 2017-10-12 (×6): 20 mg via ORAL
  Filled 2017-10-09 (×6): qty 1

## 2017-10-09 MED ORDER — PREDNISONE 1 MG PO TABS
2.0000 mg | ORAL_TABLET | Freq: Every day | ORAL | Status: DC
  Administered 2017-10-10 – 2017-10-12 (×3): 2 mg via ORAL
  Filled 2017-10-09 (×4): qty 2

## 2017-10-09 MED ORDER — SODIUM CHLORIDE 0.9% FLUSH
3.0000 mL | Freq: Two times a day (BID) | INTRAVENOUS | Status: DC
  Administered 2017-10-10 – 2017-10-12 (×5): 3 mL via INTRAVENOUS

## 2017-10-09 MED ORDER — SODIUM CHLORIDE 0.9 % IV SOLN: 250.0000 mL | INTRAVENOUS | Status: DC | PRN

## 2017-10-09 MED ORDER — IPRATROPIUM-ALBUTEROL 0.5-2.5 (3) MG/3ML IN SOLN
3.0000 mL | Freq: Once | RESPIRATORY_TRACT | Status: AC
  Administered 2017-10-09: 3 mL via RESPIRATORY_TRACT
  Filled 2017-10-09: qty 3

## 2017-10-09 NOTE — ED Notes (Signed)
CBG measured per pt request. Family states she has not eaten all day and is diabetic.

## 2017-10-09 NOTE — ED Provider Notes (Addendum)
[facility] EMERGENCY DEPARTMENT Provider Note   CSN: [id number] Arrival date & time: 10/09/17  1242     History   Chief Complaint Chief Complaint  Patient presents with  . Cough  . Fever    HPI Kayla Thompson is a 82 y.o. female.  82 year old female with past medical history including bronchiectasis, asthma, memory loss, dCHF, hypertension, ovarian cancer, type 2 diabetes mellitus who presents with cough.  The patient has had 3 days of cough productive of yellow to green sputum associated with congestion, shortness of breath, and malaise.  She reports body aches.  She has taken Mucinex at home without relief.  No known fevers and no vomiting, diarrhea, sick contacts, or recent travel.  Daughter states that she normally does breathing treatments twice daily but has not had one today and she has not taken her morning medications because she did not feel well.  Patient denies any chest pain. She can't remember whether she took tylenol PTA.   The history is provided by the patient and a relative.  Cough   Fever   Associated symptoms include cough.    Past Medical History:  Diagnosis Date  . Arthritis   . Asthma   . Diabetes mellitus without complication ([place])   . Fibromyalgia   . GERD (gastroesophageal reflux disease)   . History of chemotherapy   . History of fractured pelvis   . Hypertension   . Osteoporosis   . Osteoporosis   . Ovarian cancer [facility])     Patient Active Problem List   Diagnosis Date Noted  . Memory changes 07/29/2017  . Granulomatous lung disease ([place]) 06/14/2017  . Tracheobronchomalacia 06/14/2017  . Dizziness 03/04/2017  . Age-related osteoporosis without current pathological fracture 09/30/2016  . Exercise hypoxemia 07/12/2016  . Diastolic CHF, chronic ([place]) 12/25/2015  . GERD (gastroesophageal reflux disease) 12/25/2015  . Epidermal inclusion cyst 09/02/2015  . Type 2 diabetes mellitus with hyperglycemia, with long-term current use of  insulin ([place]) 03/10/2015  . Bronchiectasis without acute exacerbation ([place]) 01/07/2015  . PMR (polymyalgia rheumatica) ([place]) 01/07/2015  . Osteoarthritis 01/07/2015  . Asthma, chronic 11/12/2014  . Anxiety state 11/12/2014  . Hyperparathyroidism ([place]) 10/28/2014  . Fibromyalgia 09/21/2014  . Personal history of ovarian cancer 08/23/2014  . Osteoporosis 08/23/2014  . Syncope 08/02/2014  . Hypertension 08/02/2014  . HLD (hyperlipidemia) 08/02/2014  . Hypothyroidism 08/02/2014    Past Surgical History:  Procedure Laterality Date  . ABDOMINAL HYSTERECTOMY  1996  . ANKLE FRACTURE SURGERY     plate and screws  . BLADDER SUSPENSION    . CATARACT EXTRACTION    . EYE SURGERY    . hip surgey    . knee surgey    . LUNG SURGERY    . OOPHORECTOMY     BSO     OB History    Gravida  1   Para  1   Term      Preterm      AB      Living  1     SAB      TAB      Ectopic      Multiple      Live Births               Home Medications    Prior to Admission medications   Medication Sig Start Date End Date Taking? Authorizing Provider  albuterol (PROVENTIL HFA;VENTOLIN HFA) 108 (90 Base) MCG/ACT inhaler Inhale 2 puffs into the  lungs every 6 (six) hours as needed for wheezing or shortness of breath. 09/19/17   Noralee Space, MD  Apoaequorin (PREVAGEN) 10 MG CAPS Take 10 m by mouth daily. 12/01/16   [name], MD  BD PEN NEEDLE NANO U/F 32G X 4 MM MISC USE 4 TIMES A DAY 08/08/17   [name], MD  carvedilol (COREG) 6.25 MG tablet Take 0.5 tablets (3.125 mg total) by mouth 2 (two) times daily with a meal. 07/18/17   [name], MD  denosumab (PROLIA) 60 MG/ML SOLN injection Inject 60 mg into the skin every 6 (six) months. Administer in upper arm, thigh, or abdomen    [provider]  Dextromethorphan-Guaifenesin (MUCINEX DM MAXIMUM STRENGTH) 60-1200 MG TB12 Take 1 tablet by mouth 2 (two) times daily as needed (for cough/phlegm).  Reported on 06/23/2015    [provider]  donepezil (ARICEPT) 5 MG tablet TAKE 1 TABLET (5 MG TOTAL) BY MOUTH AT BEDTIME. 08/30/16   Nche, Charlene Brooke, NP  DULoxetine HCl (CYMBALTA PO) Take 90 mg by mouth daily.    [provider]  glucose blood (FREESTYLE TEST STRIPS) test strip Use to test blood sugar 3 times daily. Dx: E11.65 10/14/16   Philemon Kingdom, MD  insulin lispro (HUMALOG KWIKPEN) 100 UNIT/ML KiwkPen Inject 4-6 units into the skin daily. 10/14/16   Philemon Kingdom, MD  ipratropium-albuterol (DUONEB) 0.5-2.5 (3) MG/3ML SOLN Take 3 mLs by nebulization 3 (three) times daily. 05/18/17   Noralee Space, MD  Lancets (FREESTYLE) lancets Use to test blood sugar 3 times daily. Dx: E11.65 10/14/16   Philemon Kingdom, MD  LANTUS SOLOSTAR 100 UNIT/ML Solostar Pen INJECT 30 UNITS SUBCUTANEOUSLY EVERY MORNING 06/22/17   Philemon Kingdom, MD  levothyroxine (SYNTHROID, LEVOTHROID) 50 MCG tablet Take 1 tablet (50 mcg total) by mouth daily before breakfast. 08/30/16   Nche, Charlene Brooke, NP  montelukast (SINGULAIR) 10 MG tablet TAKE 1 TABLET (10 MG TOTAL) BY MOUTH DAILY. 06/06/17   Noralee Space, MD  predniSONE 2 MG TBEC Take 2 mg by mouth daily.    [provider]  ranitidine (ZANTAC) 150 MG tablet Take 150 mg by mouth 2 (two) times daily as needed for heartburn.    [provider]  rosuvastatin (CRESTOR) 10 MG tablet TAKE 1 TABLET (10 MG TOTAL) BY MOUTH EVERY EVENING. 09/02/17   [name], MD  SPIRIVA HANDIHALER 18 MCG inhalation capsule PLACE 1 CAPSULE INTO INHALER AND INHALE AS DIRECTED EVERY DAY 06/13/17   Noralee Space, MD  trimethoprim (TRIMPEX) 100 MG tablet Take 100 mg by mouth daily. 02/23/17   [provider]    Family History Family History  Problem Relation Age of Onset  . Lung cancer Mother   . CAD Father   . Diabetes Father   . Lung cancer Maternal Aunt   . Diabetes Paternal Aunt   . Diabetes Paternal Uncle   . Diabetes  Paternal Grandmother   . Diabetes Paternal Grandfather     Social History Social History   Tobacco Use  . Smoking status: Never Smoker  . Smokeless tobacco: Never Used  Substance Use Topics  . Alcohol use: No    Alcohol/week: 0.0 oz  . Drug use: No     Allergies   Latex and Penicillins   Review of Systems Review of Systems  Constitutional: Positive for fever.  Respiratory: Positive for cough.    All other systems reviewed and are negative except that which was  mentioned in HPI   Physical Exam Updated Vital Signs BP (!) 152/84   Pulse (!) 113   Temp 100.3 F (37.9 C) (Oral)   Resp (!) 27   Ht 5' (1.524 m)   Wt 69.4 kg (153 lb)   SpO2 98%   BMI 29.88 kg/m   Physical Exam  Constitutional: She is oriented to person, place, and time. She appears well-developed and well-nourished. No distress.  HENT:  Head: Normocephalic and atraumatic.  dry mucous membranes  Eyes: Conjunctivae are normal.  Neck: Neck supple.  Cardiovascular: Regular rhythm and normal heart sounds. Tachycardia present.  No murmur heard. Pulmonary/Chest: She has no wheezes. She has no rales.  Very mildly increased WOB, diminished b/l  Abdominal: Soft. Bowel sounds are normal. She exhibits no distension. There is no tenderness.  Musculoskeletal: She exhibits no edema.  Neurological: She is alert and oriented to person, place, and time.  Fluent speech  Skin: Skin is warm and dry.  Psychiatric: She has a normal mood and affect. Judgment normal.  Nursing note and vitals reviewed.    ED Treatments / Results  Labs (all labs ordered are listed, but only abnormal results are displayed) Labs Reviewed  COMPREHENSIVE METABOLIC PANEL - Abnormal; Notable for the following components:      Result Value   Glucose, Bld 139 (*)    Calcium 8.7 (*)    Albumin 3.4 (*)    All other components within normal limits  CBC WITH DIFFERENTIAL/PLATELET - Abnormal; Notable for the following components:   WBC 20.3  (*)    RBC 3.40 (*)    Hemoglobin 11.8 (*)    MCV 106.5 (*)    MCH 34.7 (*)    Neutro Abs 15.8 (*)    Monocytes Absolute 1.1 (*)    All other components within normal limits  CBG MONITORING, ED - Abnormal; Notable for the following components:   Glucose-Capillary 101 (*)    All other components within normal limits  CULTURE, BLOOD (ROUTINE X 2)  CULTURE, BLOOD (ROUTINE X 2)  URINALYSIS, ROUTINE W REFLEX MICROSCOPIC  I-STAT CG4 LACTIC ACID, ED  CBG MONITORING, ED  I-STAT CG4 LACTIC ACID, ED    EKG EKG Interpretation  Date/Time:  Sunday October 09 2017 12:56:55 EDT Ventricular Rate:  112 PR Interval:    QRS Duration: 102 QT Interval:  340 QTC Calculation: 465 R Axis:   75 Text Interpretation:  Sinus tachycardia Paired ventricular premature complexes Aberrant conduction of SV complex(es) Low voltage, precordial leads Borderline ST depression, anterolateral leads Minimal ST elevation, inferior leads Baseline wander in lead(s) V5 Interpretation limited secondary to artifact roughly similar to previous Confirmed by Theotis Burrow [phone]) on 10/09/2017 1:25:50 PM   Radiology Dg Chest 2 View  Result Date: 10/09/2017 CLINICAL DATA:  Cough EXAM: CHEST - 2 VIEW COMPARISON:  09/19/2017 FINDINGS: Increased left lower lobe nodular bronchovascular opacity suspicious for pneumonia when compared to the prior study. Normal heart size and vascularity. Right lung remains clear. No large effusion or pneumothorax. Rib fracture deformities bilaterally. Degenerative changes of the spine and shoulders, worse on the left. Trachea is midline. Aorta is atherosclerotic. IMPRESSION: Left lower lobe nodular bronchovascular opacities suspicious for pneumonia. Electronically Signed   By: Jerilynn Mages.  Shick M.D.   On: 10/09/2017 14:04    Procedures .Critical Care Performed by: Sharlett Iles, MD Authorized by: Sharlett Iles, MD   Critical care provider statement:    Critical care time (minutes):  30    Critical  care time was exclusive of:  Separately billable procedures and treating other patients   Critical care was necessary to treat or prevent imminent or life-threatening deterioration of the following conditions:  Sepsis and respiratory failure   Critical care was time spent personally by me on the following activities:  Development of treatment plan with patient or surrogate, evaluation of patient's response to treatment, examination of patient, obtaining history from patient or surrogate, ordering and performing treatments and interventions, ordering and review of laboratory studies, ordering and review of radiographic studies, re-evaluation of patient's condition and review of old charts   (including critical care time)  Medications Ordered in ED Medications  sodium chloride 0.9 % bolus 500 mL (0 mLs Intravenous Stopped 10/09/17 1421)  ipratropium-albuterol (DUONEB) 0.5-2.5 (3) MG/3ML nebulizer solution 3 mL (3 mLs Nebulization Given 10/09/17 1346)  cefTRIAXone (ROCEPHIN) 1 g in sodium chloride 0.9 % 100 mL IVPB (1 g Intravenous New Bag/Given 10/09/17 1420)  doxycycline (VIBRA-TABS) tablet 100 mg (100 mg Oral Given 10/09/17 1424)     Initial Impression / Assessment and Plan / ED Course  I have reviewed the triage vital signs and the nursing notes.  Pertinent labs & imaging results that were available during my care of the patient were reviewed by me and considered in my medical decision making (see chart for details).    PT uncomfortable but non-toxic on exam, T 100.3, HR 117, mild hypoxia requiring 2L [place]. No respiratory distress. Obtained labs including blood cultures and gave CTX and doxycycline for presumed pneumonia.  LAb work shows normal lactate, normal creatinine, WBC 20.3, unclear when the last time patient was on prednisone.  X x-ray shows left lower lobe pneumonia.  The patient remains on 2 L oxygen requirement which is new for her.  Therefore recommended admission for pneumonia  treatment.  Discussed admission with [facility], Dr. Zigmund Daniel, appreciate assistance.  Patient will be transferred to [facility] for [facility].  Final Clinical Impressions(s) / ED Diagnoses   Final diagnoses:  Pneumonia of left lower lobe due to infectious organism [facility])  Acute respiratory failure with hypoxia [facility])    ED Discharge Orders    None       Little, Wenda Overland, MD 10/09/17 1501    Little, Wenda Overland, MD 10/24/17 1258

## 2017-10-09 NOTE — ED Notes (Signed)
Per Dr Rex Kras, cancel 2nd lactic acid due to first one is normal

## 2017-10-09 NOTE — Progress Notes (Signed)
Per pt and her daughter she is supposed to wear 02 at home but does not use it.  Pt placed on 2L [place] due to sats of 89-90% on RA.  BBS are clear to diminished.  Pt complains of head and ear pain.  RT will continue to monitor.  Sats are 94% on 2L.

## 2017-10-09 NOTE — Plan of Care (Signed)
82 yo female comes from home with cough fever and congestion.she has history of bronchiectasis, asthma.  In the ER she was tachypneic tachycardic pulse ox in the 80s had a temp of 100.3. She was placed on 2 L of oxygen.  She also reported that she use it.  Has oxygen at home to be used as needed but she does not she was also found to have leukocytosis 20,000 WBC count, chest x-ray showed left lower lobe pneumonia normal lactate otherwise lab work is essentially normal.  She received doxycycline and ceftriaxone in the ER.  She is being admitted to [facility].

## 2017-10-09 NOTE — ED Notes (Signed)
Attempt to call report to floor, RN unavailable

## 2017-10-09 NOTE — Progress Notes (Signed)
PHARMACIST - PHYSICIAN ORDER COMMUNICATION  CONCERNING: P&T Medication Policy on Herbal Medications  DESCRIPTION:  This patient's order for:  Apoaequorin (Prevagen)  has been noted.  This product(s) is classified as an "herbal" or natural product. Due to a lack of definitive safety studies or FDA approval, nonstandard manufacturing practices, plus the potential risk of unknown drug-drug interactions while on inpatient medications, the Pharmacy and Therapeutics Committee does not permit the use of "herbal" or natural products of this type within [facility].   ACTION TAKEN: The pharmacy department is unable to verify this order at this time and your patient has been informed of this safety policy. Please reevaluate patient's clinical condition at discharge and address if the herbal or natural product(s) should be resumed at that time.  Leone Haven, PharmD

## 2017-10-09 NOTE — ED Triage Notes (Signed)
Cough and congestion x 3 days

## 2017-10-09 NOTE — H&P (Signed)
History and Physical    Kayla Thompson ZOX:[id number] DOB: March 19, 1936 DOA: 10/09/2017  PCP: [name], MD  Patient coming from: Home --> [facility]   Chief Complaint: Cough, shortness of breath  HPI: Kayla Thompson is a 82 y.o. female with medical history significant of COPD, on home O2 as needed, fibromyalgia, HTN, HLD, DM who presented to [place] with chief complaint of cough and shortness of breath.  She states that she always has a cough.  However in the last 2 days, her productive cough has increased in nature.  She also admits to shortness of breath, central chest pain that rates at about an 8 and is a pressure sensation in nature.  She complains of a headache, nausea without vomiting or diarrhea or abdominal pain.  She denies any peripheral edema.  No fevers or chills at home.  ED Course:  In the ED, her pulse ox was in the 80s and was placed on [place] O2.  She had a leukocytosis of 20.3.  Chest x-ray revealed left lobar pneumonia.  She was transferred to [facility] for further treatment and care.  Review of Systems: As per HPI otherwise 10 point review of systems negative.   Past Medical History:  Diagnosis Date  . Arthritis   . Asthma   . Diabetes mellitus without complication ([place])   . Fibromyalgia   . GERD (gastroesophageal reflux disease)   . History of chemotherapy   . History of fractured pelvis   . Hypertension   . Osteoporosis   . Osteoporosis   . Ovarian cancer [facility])     Past Surgical History:  Procedure Laterality Date  . ABDOMINAL HYSTERECTOMY  1996  . ANKLE FRACTURE SURGERY     plate and screws  . BLADDER SUSPENSION    . CATARACT EXTRACTION    . EYE SURGERY    . hip surgey    . knee surgey    . LUNG SURGERY    . OOPHORECTOMY     BSO     reports that she has never smoked. She has never used smokeless tobacco. She reports that she does not drink alcohol or use drugs.  Allergies  Allergen Reactions  . Latex Rash  . Penicillins  Rash    Family History  Problem Relation Age of Onset  . Lung cancer Mother   . CAD Father   . Diabetes Father   . Lung cancer Maternal Aunt   . Diabetes Paternal Aunt   . Diabetes Paternal Uncle   . Diabetes Paternal Grandmother   . Diabetes Paternal Grandfather     Prior to Admission medications   Medication Sig Start Date End Date Taking? Authorizing Provider  albuterol (PROVENTIL HFA;VENTOLIN HFA) 108 (90 Base) MCG/ACT inhaler Inhale 2 puffs into the lungs every 6 (six) hours as needed for wheezing or shortness of breath. 09/19/17   Noralee Space, MD  Apoaequorin (PREVAGEN) 10 MG CAPS Take 10 m by mouth daily. 12/01/16   [name], MD  BD PEN NEEDLE NANO U/F 32G X 4 MM MISC USE 4 TIMES A DAY 08/08/17   [name], MD  carvedilol (COREG) 6.25 MG tablet Take 0.5 tablets (3.125 mg total) by mouth 2 (two) times daily with a meal. 07/18/17   [name], MD  denosumab (PROLIA) 60 MG/ML SOLN injection Inject 60 mg into the skin every 6 (six) months. Administer in upper arm, thigh, or abdomen    [provider]  Dextromethorphan-Guaifenesin (MUCINEX DM MAXIMUM STRENGTH) 60-1200 MG TB12 Take 1 tablet by mouth 2 (two) times daily as needed (for cough/phlegm). Reported on 06/23/2015    [provider]  donepezil (ARICEPT) 5 MG tablet TAKE 1 TABLET (5 MG TOTAL) BY MOUTH AT BEDTIME. 08/30/16   Nche, Charlene Brooke, NP  DULoxetine HCl (CYMBALTA PO) Take 90 mg by mouth daily.    [provider]  glucose blood (FREESTYLE TEST STRIPS) test strip Use to test blood sugar 3 times daily. Dx: E11.65 10/14/16   Philemon Kingdom, MD  insulin lispro (HUMALOG KWIKPEN) 100 UNIT/ML KiwkPen Inject 4-6 units into the skin daily. 10/14/16   Philemon Kingdom, MD  ipratropium-albuterol (DUONEB) 0.5-2.5 (3) MG/3ML SOLN Take 3 mLs by nebulization 3 (three) times daily. 05/18/17   Noralee Space, MD  Lancets (FREESTYLE) lancets Use to test blood sugar 3 times daily.  Dx: E11.65 10/14/16   Philemon Kingdom, MD  LANTUS SOLOSTAR 100 UNIT/ML Solostar Pen INJECT 30 UNITS SUBCUTANEOUSLY EVERY MORNING 06/22/17   Philemon Kingdom, MD  levothyroxine (SYNTHROID, LEVOTHROID) 50 MCG tablet Take 1 tablet (50 mcg total) by mouth daily before breakfast. 08/30/16   Nche, Charlene Brooke, NP  montelukast (SINGULAIR) 10 MG tablet TAKE 1 TABLET (10 MG TOTAL) BY MOUTH DAILY. 06/06/17   Noralee Space, MD  predniSONE 2 MG TBEC Take 2 mg by mouth daily.    [provider]  ranitidine (ZANTAC) 150 MG tablet Take 150 mg by mouth 2 (two) times daily as needed for heartburn.    [provider]  rosuvastatin (CRESTOR) 10 MG tablet TAKE 1 TABLET (10 MG TOTAL) BY MOUTH EVERY EVENING. 09/02/17   [name], MD  SPIRIVA HANDIHALER 18 MCG inhalation capsule PLACE 1 CAPSULE INTO INHALER AND INHALE AS DIRECTED EVERY DAY 06/13/17   Noralee Space, MD  trimethoprim (TRIMPEX) 100 MG tablet Take 100 mg by mouth daily. 02/23/17   [provider]    Physical Exam: Vitals:   10/09/17 1439 10/09/17 1532 10/09/17 1601 10/09/17 1715  BP: (!) 152/84 (!) 143/60  104/65  Pulse:    (!) 112  Resp: (!) 27 (!) 27  20  Temp:   99.5 F (37.5 C) 100.3 F (37.9 C)  TempSrc:   Oral Oral  SpO2:    96%  Weight:    67.4 kg (148 lb 9.4 oz)  Height:    5' (1.524 m)    Constitutional: NAD, calm, comfortable Eyes: PERRL, lids and conjunctivae normal ENMT: Mucous membranes are moist. Posterior pharynx clear of any exudate or lesions.Normal dentition.  Neck: normal, supple, no masses, no thyromegaly Respiratory: clear to auscultation bilaterally, diminished in the bases, no wheeze. Normal respiratory effort. No accessory muscle use.  On nasal cannula O2 Cardiovascular: Tachycardic rate and regular rhythm, no murmurs / rubs / gallops. No extremity edema.  Abdomen: no tenderness, no masses palpated. No hepatosplenomegaly. Bowel sounds positive.  Musculoskeletal: no clubbing /  cyanosis. No joint deformity upper and lower extremities. Good ROM, no contractures. Normal muscle tone.  Skin: no rashes, lesions, ulcers. No induration Neurologic: CN 2-12 grossly intact. Strength 5/5 in all 4.  Psychiatric: Normal judgment and insight. Alert and oriented x 3. Normal mood.   Labs on Admission: I have personally reviewed following labs and imaging studies  CBC: Recent Labs  Lab 10/09/17 1318  WBC 20.3*  NEUTROABS 15.8*  HGB 11.8*  HCT 36.2  MCV 106.5*  PLT 009   Basic Metabolic Panel: Recent Labs  Lab 10/09/17 1318  NA 135  K 4.0  CL 100  CO2 25  GLUCOSE 139*  BUN 16  CREATININE 0.88  CALCIUM 8.7*   GFR: Estimated Creatinine Clearance: 43.7 mL/min (by C-G formula based on SCr of 0.88 mg/dL). Liver Function Tests: Recent Labs  Lab 10/09/17 1318  AST 18  ALT 23  ALKPHOS 65  BILITOT 0.9  PROT 6.9  ALBUMIN 3.4*   No results for input(s): LIPASE, AMYLASE in the last 168 hours. No results for input(s): AMMONIA in the last 168 hours. Coagulation Profile: No results for input(s): INR, PROTIME in the last 168 hours. Cardiac Enzymes: No results for input(s): CKTOTAL, CKMB, CKMBINDEX, TROPONINI in the last 168 hours. BNP (last 3 results) No results for input(s): PROBNP in the last 8760 hours. HbA1C: No results for input(s): HGBA1C in the last 72 hours. CBG: Recent Labs  Lab 10/09/17 1359  GLUCAP 101*   Lipid Profile: No results for input(s): CHOL, HDL, LDLCALC, TRIG, CHOLHDL, LDLDIRECT in the last 72 hours. Thyroid Function Tests: No results for input(s): TSH, T4TOTAL, FREET4, T3FREE, THYROIDAB in the last 72 hours. Anemia Panel: No results for input(s): VITAMINB12, FOLATE, FERRITIN, TIBC, IRON, RETICCTPCT in the last 72 hours. Urine analysis:    Component Value Date/Time   COLORURINE YELLOW 10/09/2017 1507   APPEARANCEUR CLEAR 10/09/2017 1507   LABSPEC 1.010 10/09/2017 1507   PHURINE 6.0 10/09/2017 1507   GLUCOSEU NEGATIVE 10/09/2017  1507   HGBUR TRACE (A) 10/09/2017 1507   BILIRUBINUR NEGATIVE 10/09/2017 1507   KETONESUR 15 (A) 10/09/2017 1507   PROTEINUR NEGATIVE 10/09/2017 1507   UROBILINOGEN 0.2 08/02/2014 0231   NITRITE NEGATIVE 10/09/2017 1507   LEUKOCYTESUR NEGATIVE 10/09/2017 1507   Sepsis Labs: !!!!!!!!!!!!!!!!!!!!!!!!!!!!!!!!!!!!!!!!!!!! @LABRCNTIP (procalcitonin:4,lacticidven:4) )No results found for this or any previous visit (from the past 240 hour(s)).   Radiological Exams on Admission: Dg Chest 2 View  Result Date: 10/09/2017 CLINICAL DATA:  Cough EXAM: CHEST - 2 VIEW COMPARISON:  09/19/2017 FINDINGS: Increased left lower lobe nodular bronchovascular opacity suspicious for pneumonia when compared to the prior study. Normal heart size and vascularity. Right lung remains clear. No large effusion or pneumothorax. Rib fracture deformities bilaterally. Degenerative changes of the spine and shoulders, worse on the left. Trachea is midline. Aorta is atherosclerotic. IMPRESSION: Left lower lobe nodular bronchovascular opacities suspicious for pneumonia. Electronically Signed   By: Jerilynn Mages.  Shick M.D.   On: 10/09/2017 14:04    EKG: Independently reviewed.  Sinus tachycardia, no ST elevation noted.  Artifact noted.  Assessment/Plan Principal Problem:   Pneumonia Active Problems:   Hypertension   HLD (hyperlipidemia)   Hypothyroidism   Fibromyalgia   Type 2 diabetes mellitus with hyperglycemia, with long-term current use of insulin (HCC)   Memory changes   Sepsis secondary to left lobar pneumonia -Blood cultures pending -Continue rocephin/azithromycin   Acute on chronic hypoxemic respiratory failure  -Patient reports that she does not always wear her oxygen at home.  She is supposed to use it as needed.   -Continue [place] O2, wean as able  COPD without exacerbation -Continue nebulizer treatments -Chronic prednisone 2mg  daily   Atypical chest pain -Reproducible with palpation -EKG without acute ST  elevation -Trend trop  Diabetes mellitus type 2 -Continue Lantus, sliding scale insulin  Hypothyroidism -Continue Synthroid  Hyperlipidemia -Continue Crestor  Essential hypertension -Continue Coreg  Fibromyalgia -Continue Cymbalta  Dementia -Continue Aricept   DVT prophylaxis: Lovenox Code Status: Full  Family Communication: Daughter at bedside Disposition Plan: Pending improvement, will  need PT OT prior to discharge to determine disposition  Consults called: None  Admission status: Inpatient   * I certify that at the point of admission it is my clinical judgment that the patient will require inpatient hospital care spanning beyond 2 midnights from the point of admission due to high intensity of service, high risk for further deterioration and high frequency of surveillance required.*   Dessa Phi, DO [facility] [email or web address] Password [facility] 10/09/2017, 5:40 PM

## 2017-10-10 DIAGNOSIS — E43 Unspecified severe protein-calorie malnutrition: Secondary | ICD-10-CM

## 2017-10-10 DIAGNOSIS — E440 Moderate protein-calorie malnutrition: Secondary | ICD-10-CM

## 2017-10-10 LAB — CBC
HCT: 35.3 % — ABNORMAL LOW (ref 36.0–46.0)
Hemoglobin: 11.2 g/dL — ABNORMAL LOW (ref 12.0–15.0)
MCH: 33.8 pg (ref 26.0–34.0)
MCHC: 31.7 g/dL (ref 30.0–36.0)
MCV: 106.6 fL — ABNORMAL HIGH (ref 78.0–100.0)
Platelets: 264 10*3/uL (ref 150–400)
RBC: 3.31 MIL/uL — ABNORMAL LOW (ref 3.87–5.11)
RDW: 13.1 % (ref 11.5–15.5)
WBC: 18.5 10*3/uL — ABNORMAL HIGH (ref 4.0–10.5)

## 2017-10-10 LAB — GLUCOSE, CAPILLARY
Glucose-Capillary: 101 mg/dL — ABNORMAL HIGH (ref 70–99)
Glucose-Capillary: 194 mg/dL — ABNORMAL HIGH (ref 70–99)
Glucose-Capillary: 127 mg/dL — ABNORMAL HIGH (ref 70–99)
Glucose-Capillary: 145 mg/dL — ABNORMAL HIGH (ref 70–99)

## 2017-10-10 LAB — BASIC METABOLIC PANEL
Anion gap: 11 (ref 5–15)
BUN: 13 mg/dL (ref 8–23)
CO2: 29 mmol/L (ref 22–32)
Calcium: 9 mg/dL (ref 8.9–10.3)
Chloride: 105 mmol/L (ref 98–111)
Creatinine, Ser: 0.79 mg/dL (ref 0.44–1.00)
GFR calc Af Amer: >60 mL/min (ref >60)
GFR calc non Af Amer: >60 mL/min (ref >60)
Glucose, Bld: 115 mg/dL — ABNORMAL HIGH (ref 70–99)
Potassium: 3.9 mmol/L (ref 3.5–5.1)
Sodium: 145 mmol/L (ref 135–145)

## 2017-10-10 LAB — EXPECTORATED SPUTUM ASSESSMENT W GRAM STAIN, RFLX TO RESP C

## 2017-10-10 LAB — TROPONIN I
Troponin I: <0.03 ng/mL (ref <0.03)
Troponin I: <0.03 ng/mL (ref <0.03)

## 2017-10-10 LAB — EXPECTORATED SPUTUM ASSESSMENT W REFEX TO RESP CULTURE

## 2017-10-10 MED ORDER — MELATONIN 5 MG PO TABS
5.0000 mg | ORAL_TABLET | Freq: Every day | ORAL | Status: DC
  Filled 2017-10-10 (×2): qty 1

## 2017-10-10 MED ORDER — DIFLUPREDNATE 0.05 % OP EMUL: 1.0000 [drp] | Freq: Two times a day (BID) | OPHTHALMIC | Status: DC

## 2017-10-10 MED ORDER — PANTOPRAZOLE SODIUM 40 MG PO TBEC
40.0000 mg | DELAYED_RELEASE_TABLET | Freq: Every day | ORAL | Status: DC
Start: 2017-10-10 — End: 2017-10-12
  Administered 2017-10-10 – 2017-10-12 (×3): 40 mg via ORAL
  Filled 2017-10-10 (×3): qty 1

## 2017-10-10 MED ORDER — OFLOXACIN 0.3 % OP SOLN
1.0000 [drp] | Freq: Two times a day (BID) | OPHTHALMIC | Status: DC
  Filled 2017-10-10: qty 5

## 2017-10-10 MED ORDER — ACETAMINOPHEN 500 MG PO TABS
1000.0000 mg | ORAL_TABLET | Freq: Four times a day (QID) | ORAL | Status: DC | PRN
  Administered 2017-10-10 – 2017-10-12 (×4): 1000 mg via ORAL
  Filled 2017-10-10 (×4): qty 2

## 2017-10-10 MED ORDER — CYCLOSPORINE 0.05 % OP EMUL
1.0000 [drp] | Freq: Two times a day (BID) | OPHTHALMIC | Status: DC
  Administered 2017-10-10 – 2017-10-12 (×5): 1 [drp] via OPHTHALMIC
  Filled 2017-10-10 (×5): qty 1

## 2017-10-10 MED ORDER — FLUTICASONE PROPIONATE 50 MCG/ACT NA SUSP
2.0000 | Freq: Every day | NASAL | Status: DC
  Administered 2017-10-10 – 2017-10-12 (×3): 2 via NASAL
  Filled 2017-10-10: qty 16

## 2017-10-10 MED ORDER — INSULIN GLARGINE 100 UNIT/ML ~~LOC~~ SOLN
20.0000 [IU] | Freq: Every day | SUBCUTANEOUS | Status: DC
  Administered 2017-10-10 – 2017-10-11 (×2): 20 [IU] via SUBCUTANEOUS
  Filled 2017-10-10 (×3): qty 0.2

## 2017-10-10 MED ORDER — GLUCERNA SHAKE PO LIQD
237.0000 mL | Freq: Three times a day (TID) | ORAL | Status: DC
  Administered 2017-10-10 – 2017-10-12 (×5): 237 mL via ORAL
  Filled 2017-10-10 (×8): qty 237

## 2017-10-10 MED ORDER — MOMETASONE FURO-FORMOTEROL FUM 200-5 MCG/ACT IN AERO
2.0000 | INHALATION_SPRAY | Freq: Two times a day (BID) | RESPIRATORY_TRACT | Status: DC
  Administered 2017-10-11 – 2017-10-12 (×3): 2 via RESPIRATORY_TRACT
  Filled 2017-10-10: qty 8.8

## 2017-10-10 MED ORDER — ZOLPIDEM TARTRATE 5 MG PO TABS
5.0000 mg | ORAL_TABLET | Freq: Every day | ORAL | Status: DC
  Administered 2017-10-10 – 2017-10-11 (×2): 5 mg via ORAL
  Filled 2017-10-10 (×2): qty 1

## 2017-10-10 NOTE — Progress Notes (Signed)
PROGRESS NOTE    Kayla Thompson  UVO:[id number] DOB: 08/12/1935 DOA: 10/09/2017 PCP: [name], MD     Brief Narrative:  Kayla Thompson is a 82 y.o. female with medical history significant of COPD, on home O2 as needed, fibromyalgia, HTN, HLD, DM who presented to [place] with chief complaint of cough and shortness of breath.  She states that she always has a cough.  However in the last 2 days, her productive cough has increased in nature.  She also admits to shortness of breath, central chest pain that rates at about an 8 and is a pressure sensation in nature.  She complains of a headache, nausea without vomiting or diarrhea or abdominal pain.  She denies any peripheral edema.  No fevers or chills at home.  In the ED, her pulse ox was in the 80s and was placed on [place] O2.  She had a leukocytosis of 20.3.  Chest x-ray revealed left lobar pneumonia.  She was transferred to [facility] for further treatment and care.  New events last 24 hours / Subjective: Continues to have a productive cough, which per patient is chronic for her. States she feels a little bit better. Has a headache and usually takes 2 x 650mg  tylenol at home.   Assessment & Plan:   Principal Problem:   Pneumonia Active Problems:   Hypertension   HLD (hyperlipidemia)   Hypothyroidism   Fibromyalgia   Type 2 diabetes mellitus with hyperglycemia, with long-term current use of insulin (HCC)   Memory changes   Sepsis secondary to left lobar pneumonia -Blood cultures pending -Continue rocephin/azithromycin   Acute on chronic hypoxemic respiratory failure  -Patient reports that she does not always wear her oxygen at home.  She is supposed to use it as needed.   -Continue [place] O2, wean as able  COPD without exacerbation -Continue nebulizer treatments, dulera, spiriva  -Chronic prednisone 2mg  daily   Atypical chest pain -Reproducible with palpation -EKG without acute ST elevation -Trop  neg x 3   Diabetes mellitus type 2 -Continue Lantus, sliding scale insulin  Hypothyroidism -Continue Synthroid  Hyperlipidemia -Continue Crestor  Essential hypertension -Continue Coreg  Fibromyalgia -Continue Cymbalta  Dementia -Continue Aricept     DVT prophylaxis: Lovenox Code Status: Full Family Communication: No family at bedside Disposition Plan: Pending improvement, PT OT eval   Consultants:   None  Procedures:   None   Antimicrobials:  Anti-infectives (From admission, onward)   Start     Dose/Rate Route Frequency Ordered Stop   10/10/17 1400  cefTRIAXone (ROCEPHIN) 1 g in sodium chloride 0.9 % 100 mL IVPB     1 g 200 mL/hr over 30 Minutes Intravenous Every 24 hours 10/09/17 1807 10/17/17 1359   10/09/17 1830  azithromycin (ZITHROMAX) tablet 500 mg     500 mg Oral Every 24 hours 10/09/17 1807 10/16/17 1829   10/09/17 1330  cefTRIAXone (ROCEPHIN) 1 g in sodium chloride 0.9 % 100 mL IVPB     1 g 200 mL/hr over 30 Minutes Intravenous  Once 10/09/17 1320 10/09/17 1532   10/09/17 1330  doxycycline (VIBRA-TABS) tablet 100 mg     100 mg Oral  Once 10/09/17 1320 10/09/17 1424        Objective: Vitals:   10/09/17 1715 10/09/17 2053 10/10/17 0528 10/10/17 0748  BP: 104/65 (!) 104/58 126/60   Pulse: (!) 112 92 75   Resp: 20 20 20    Temp: 100.3 F (37.9 C) 99.1  F (37.3 C) 98.5 F (36.9 C)   TempSrc: Oral Oral Oral   SpO2: 96% 96% 98% 96%  Weight: 67.4 kg (148 lb 9.4 oz)     Height: 5' (1.524 m)       Intake/Output Summary (Last 24 hours) at 10/10/2017 0941 Last data filed at 10/10/2017 0842 Gross per 24 hour  Intake 860 ml  Output 100 ml  Net 760 ml   Filed Weights   10/09/17 1249 10/09/17 1715  Weight: 69.4 kg (153 lb) 67.4 kg (148 lb 9.4 oz)    Examination:  General exam: Appears calm and comfortable  Respiratory system: Clear to auscultation. Respiratory effort normal. Minimal wheezes  Cardiovascular system: S1 & S2 heard, RRR.  No JVD, murmurs, rubs, gallops or clicks. No pedal edema. Gastrointestinal system: Abdomen is nondistended, soft and nontender. No organomegaly or masses felt. Normal bowel sounds heard. Central nervous system: Alert and oriented. No focal neurological deficits. Extremities: Symmetric 5 x 5 power. Skin: No rashes, lesions or ulcers Psychiatry: Judgement and insight appear normal. Mood & affect appropriate.   Data Reviewed: I have personally reviewed following labs and imaging studies  CBC: Recent Labs  Lab 10/09/17 1318 10/10/17 0636  WBC 20.3* 18.5*  NEUTROABS 15.8*  --   HGB 11.8* 11.2*  HCT 36.2 35.3*  MCV 106.5* 106.6*  PLT 271 443   Basic Metabolic Panel: Recent Labs  Lab 10/09/17 1318 10/10/17 0636  NA 135 145  K 4.0 3.9  CL 100 105  CO2 25 29  GLUCOSE 139* 115*  BUN 16 13  CREATININE 0.88 0.79  CALCIUM 8.7* 9.0   GFR: Estimated Creatinine Clearance: 48.1 mL/min (by C-G formula based on SCr of 0.79 mg/dL). Liver Function Tests: Recent Labs  Lab 10/09/17 1318  AST 18  ALT 23  ALKPHOS 65  BILITOT 0.9  PROT 6.9  ALBUMIN 3.4*   No results for input(s): LIPASE, AMYLASE in the last 168 hours. No results for input(s): AMMONIA in the last 168 hours. Coagulation Profile: No results for input(s): INR, PROTIME in the last 168 hours. Cardiac Enzymes: Recent Labs  Lab 10/09/17 1842 10/10/17 0012 10/10/17 0636  TROPONINI <0.03 <0.03 <0.03   BNP (last 3 results) No results for input(s): PROBNP in the last 8760 hours. HbA1C: No results for input(s): HGBA1C in the last 72 hours. CBG: Recent Labs  Lab 10/09/17 1359 10/09/17 2049 10/10/17 0753  GLUCAP 101* 178* 101*   Lipid Profile: No results for input(s): CHOL, HDL, LDLCALC, TRIG, CHOLHDL, LDLDIRECT in the last 72 hours. Thyroid Function Tests: No results for input(s): TSH, T4TOTAL, FREET4, T3FREE, THYROIDAB in the last 72 hours. Anemia Panel: No results for input(s): VITAMINB12, FOLATE, FERRITIN,  TIBC, IRON, RETICCTPCT in the last 72 hours. Sepsis Labs: Recent Labs  Lab 10/09/17 1330  LATICACIDVEN 1.38    Recent Results (from the past 240 hour(s))  Culture, sputum-assessment     Status: None   Collection Time: 10/10/17 12:45 AM  Result Value Ref Range Status   Specimen Description EXPECTORATED SPUTUM  Final   Special Requests NONE  Final   Sputum evaluation   Final    THIS SPECIMEN IS ACCEPTABLE FOR SPUTUM CULTURE Performed at [facility], [place] [address] [place], [place] [address]    Report Status 10/10/2017 FINAL  Final  Culture, respiratory (NON-Expectorated)     Status: None (Preliminary result)   Collection Time: 10/10/17 12:45 AM  Result Value Ref Range Status   Specimen Description  Final    EXPECTORATED SPUTUM Performed at [facility] [address], [place], [place] [address]    Special Requests   Final    NONE Reflexed from [id number] Performed at [facility], [place] [address], [place], [place] [address]    Gram Stain   Final    NO WBC SEEN FEW SQUAMOUS EPITHELIAL CELLS PRESENT RARE GRAM POSITIVE COCCI IN PAIRS Performed at [facility], [place] [address], [place], [place] [address]    Culture PENDING  Incomplete   Report Status PENDING  Incomplete       Radiology Studies: Dg Chest 2 View  Result Date: 10/09/2017 CLINICAL DATA:  Cough EXAM: CHEST - 2 VIEW COMPARISON:  09/19/2017 FINDINGS: Increased left lower lobe nodular bronchovascular opacity suspicious for pneumonia when compared to the prior study. Normal heart size and vascularity. Right lung remains clear. No large effusion or pneumothorax. Rib fracture deformities bilaterally. Degenerative changes of the spine and shoulders, worse on the left. Trachea is midline. Aorta is atherosclerotic. IMPRESSION: Left lower lobe nodular bronchovascular opacities suspicious for pneumonia. Electronically Signed   By: Jerilynn Mages.  Shick M.D.   On: 10/09/2017  14:04      Scheduled Meds: . azithromycin  500 mg Oral Q24H  . carvedilol  3.125 mg Oral BID WC  . cycloSPORINE  1 drop Both Eyes BID  . Difluprednate  1 drop Right Eye BID  . donepezil  5 mg Oral QHS  . DULoxetine  90 mg Oral Daily  . enoxaparin (LOVENOX) injection  40 mg Subcutaneous Q24H  . famotidine  20 mg Oral BID  . feeding supplement (ENSURE ENLIVE)  237 mL Oral BID BM  . fluticasone  2 spray Each Nare Daily  . insulin aspart  0-5 Units Subcutaneous QHS  . insulin aspart  0-9 Units Subcutaneous TID WC  . insulin glargine  30 Units Subcutaneous QHS  . ipratropium-albuterol  3 mL Nebulization TID  . levothyroxine  50 mcg Oral QAC breakfast  . Melatonin  5 mg Oral QHS  . mometasone-formoterol  2 puff Inhalation BID  . montelukast  10 mg Oral QHS  . ofloxacin  1 drop Right Eye BID  . predniSONE  2 mg Oral Q breakfast  . rosuvastatin  10 mg Oral QPM  . sodium chloride flush  3 mL Intravenous Q12H  . tiotropium  18 mcg Inhalation Daily   Continuous Infusions: . sodium chloride    . cefTRIAXone (ROCEPHIN)  IV       LOS: 1 day    Time spent: 25 minutes   Dessa Phi, DO [facility] [email or web address] Password [facility] 10/10/2017, 9:41 AM

## 2017-10-10 NOTE — Evaluation (Addendum)
Occupational Therapy Evaluation Patient Details Name: Kayla Thompson MRN: [id number] DOB: Nov 10, 1935 Today's Date: 10/10/2017    History of Present Illness Pt was admitted for pna. PMH:  COPD, fibromyalgia, HTN, DM and dementia   Clinical Impression   This 82 year old female was admitted for the above.  She has ADL assistance at home 5x week but performs a lof of this by herself. She and daughter live together.  Will follow in acute setting with supervision level goals for toileting    Follow Up Recommendations  Supervision/Assistance - 24 hour    Equipment Recommendations  None recommended by OT    Recommendations for Other Services       Precautions / Restrictions Precautions Precautions: Fall Restrictions Weight Bearing Restrictions: No      Mobility Bed Mobility               General bed mobility comments: oob  Transfers Overall transfer level: Needs assistance Equipment used: Rolling walker (2 wheeled) Transfers: Sit to/from Stand Sit to Stand: Supervision              Balance                                           ADL either performed or assessed with clinical judgement   ADL Overall ADL's : Needs assistance/impaired     Grooming: Oral care;Supervision/safety;Standing       Lower Body Bathing: Minimal assistance;Sit to/from stand       Lower Body Dressing: Moderate assistance;Sit to/from stand   Toilet Transfer: Min guard;Ambulation;BSC;RW   Toileting- Clothing Manipulation and Hygiene: Supervision/safety;Sit to/from stand         General ADL Comments: performed ADL in bathroom. Used RW and cues to keep it in front of her.  Pt has ADL assistance at baseline     Vision         Perception     Praxis      Pertinent Vitals/Pain Pain Assessment: No/denies pain     Hand Dominance     Extremity/Trunk Assessment Upper Extremity Assessment Upper Extremity Assessment: Generalized weakness(decreased  ROM/strength L shoulder)           Communication Communication Communication: No difficulties   Cognition Arousal/Alertness: Awake/alert Behavior During Therapy: WFL for tasks assessed/performed Overall Cognitive Status: h/o cognitive impairment at baseline:  Decreased memory/safety                                     General Comments   Pt has not been wearing 02 all the time at home. Used 2 liters during adl    Exercises     Shoulder Instructions      Home Living Family/patient expects to be discharged to:: Private residence Living Arrangements: Children Available Help at Discharge: Family               Bathroom Shower/Tub: Walk-in shower   Bathroom Toilet: Handicapped height     Home Equipment: Environmental consultant - 2 wheels;Cane - single point;Shower seat          Prior Functioning/Environment          Comments: has aidex 5x week; did a lot on her own        OT Problem List: Decreased strength;Decreased activity tolerance;Decreased safety awareness  OT Treatment/Interventions: Self-care/ADL training;Energy conservation;DME and/or AE instruction;Patient/family education;Balance training    OT Goals(Current goals can be found in the care plan section) Acute Rehab OT Goals Patient Stated Goal: none stated OT Goal Formulation: With patient Time For Goal Achievement: 10/24/17 Potential to Achieve Goals: Good ADL Goals Pt Will Transfer to Toilet: with modified independence;ambulating;regular height toilet;bedside commode Pt Will Perform Toileting - Clothing Manipulation and hygiene: with modified independence;sit to/from stand  OT Frequency: Min 2X/week   Barriers to D/C:            Co-evaluation              AM-PAC PT "6 Clicks" Daily Activity     Outcome Measure Help from another person eating meals?: None Help from another person taking care of personal grooming?: A Little Help from another person toileting, which includes  using toliet, bedpan, or urinal?: A Little Help from another person bathing (including washing, rinsing, drying)?: A Little Help from another person to put on and taking off regular upper body clothing?: A Little Help from another person to put on and taking off regular lower body clothing?: A Lot 6 Click Score: 18   End of Session    Activity Tolerance: Patient tolerated treatment well Patient left: in chair;with call bell/phone within reach;with family/visitor present  OT Visit Diagnosis: Muscle weakness (generalized) (M62.81)                Time: 0102-7253 OT Time Calculation (min): 29 min Charges:  OT General Charges $OT Visit: 1 Visit OT Evaluation $OT Eval Low Complexity: 1 Low OT Treatments $Self Care/Home Management : 8-22 mins G-Codes:     [place], OTR/L [phone] 10/10/2017  Kayla Thompson 10/10/2017, 12:55 PM

## 2017-10-10 NOTE — Evaluation (Signed)
Physical Therapy Evaluation Patient Details Name: Kayla Thompson MRN: [id number] DOB: [birth year] Today's Date: 10/10/2017   History of Present Illness  82 yo female admitted with pna. Hx of COPD, fibromyalgia, HTN, DM, dementia  Clinical Impression  On eval, pt was Min guard assist for mobility. She walked ~60 feet with a RW. Pt participated well. Dyspnea 2/4 with ambulation. Will follow during hospital stay. Recommend HHPT f/u, if pt and family are agreeable.     Follow Up Recommendations Home health PT;Supervision - Intermittent    Equipment Recommendations  None recommended by PT    Recommendations for Other Services       Precautions / Restrictions Precautions Precautions: Fall Precaution Comments: monitor O2. O2 dep Restrictions Weight Bearing Restrictions: No      Mobility  Bed Mobility Overal bed mobility: Needs Assistance Bed Mobility: Supine to Sit;Sit to Supine     Supine to sit: Supervision Sit to supine: Supervision   General bed mobility comments: for safety  Transfers Overall transfer level: Needs assistance Equipment used: Rolling walker (2 wheeled) Transfers: Sit to/from Stand Sit to Stand: Supervision         General transfer comment: for safety  Ambulation/Gait Ambulation/Gait assistance: Min guard Gait Distance (Feet): 60 Feet Assistive device: Rolling walker (2 wheeled) Gait Pattern/deviations: Step-through pattern     General Gait Details: close guard for safety. dyspnea 2/4.   Stairs            Wheelchair Mobility    Modified Rankin (Stroke Patients Only)       Balance Overall balance assessment: Needs assistance         Standing balance support: Bilateral upper extremity supported Standing balance-Leahy Scale: Poor Standing balance comment: requires RW                             Pertinent Vitals/Pain Pain Assessment: No/denies pain    Home Living Family/patient expects to be discharged to::  Private residence Living Arrangements: Children Available Help at Discharge: Family Type of Home: House         Home Equipment: Gilford Rile - 2 wheels;Cane - single point;Shower seat      Prior Function Level of Independence: Independent with assistive device(s)         Comments: has aidex 5x week; did a lot on her own. uses tripod walker for ambulation     Hand Dominance        Extremity/Trunk Assessment   Upper Extremity Assessment Upper Extremity Assessment: Defer to OT evaluation    Lower Extremity Assessment Lower Extremity Assessment: Generalized weakness    Cervical / Trunk Assessment Cervical / Trunk Assessment: Normal  Communication   Communication: No difficulties  Cognition Arousal/Alertness: Awake/alert Behavior During Therapy: WFL for tasks assessed/performed Overall Cognitive Status: History of cognitive impairments - at baseline                                        General Comments      Exercises     Assessment/Plan    PT Assessment Patient needs continued PT services  PT Problem List Decreased mobility;Decreased activity tolerance       PT Treatment Interventions DME instruction;Gait training;Functional mobility training;Therapeutic activities;Balance training;Patient/family education;Therapeutic exercise    PT Goals (Current goals can be found in the Care Plan section)  Acute Rehab PT Goals Patient  Stated Goal: none stated PT Goal Formulation: With patient Time For Goal Achievement: 10/24/17 Potential to Achieve Goals: Good    Frequency Min 3X/week   Barriers to discharge        Co-evaluation               AM-PAC PT "6 Clicks" Daily Activity  Outcome Measure Difficulty turning over in bed (including adjusting bedclothes, sheets and blankets)?: None Difficulty moving from lying on back to sitting on the side of the bed? : None Difficulty sitting down on and standing up from a chair with arms (e.g.,  wheelchair, bedside commode, etc,.)?: None Help needed moving to and from a bed to chair (including a wheelchair)?: A Little Help needed walking in hospital room?: A Little Help needed climbing 3-5 steps with a railing? : A Little 6 Click Score: 21    End of Session Equipment Utilized During Treatment: Gait belt Activity Tolerance: Patient tolerated treatment well Patient left: in bed;with call bell/phone within reach;with bed alarm set Nurse Communication: (made NT aware of need for purewick replacement) PT Visit Diagnosis: Muscle weakness (generalized) (M62.81)    Time: 3612-2449 PT Time Calculation (min) (ACUTE ONLY): 16 min   Charges:   PT Evaluation $PT Eval Moderate Complexity: 1 Mod     PT G Codes:          Weston Anna, MPT Pager: [phone]

## 2017-10-10 NOTE — Progress Notes (Signed)
Initial Nutrition Assessment  DOCUMENTATION CODES:   Non-severe (moderate) malnutrition in context of chronic illness  INTERVENTION:   - Glucerna Shake po TID, each supplement provides 220 kcal and 10 grams of protein (chocolate)  - d/c Ensure Enlive po BID  NUTRITION DIAGNOSIS:   Moderate Malnutrition related to chronic illness(COPD) as evidenced by mild fat depletion, mild muscle depletion, moderate muscle depletion.  GOAL:   Patient will meet greater than or equal to 90% of their needs  MONITOR:   PO intake, Supplement acceptance, Labs, Weight trends, I & O's  REASON FOR ASSESSMENT:   Malnutrition Screening Tool    ASSESSMENT:   82 year old female who presented to the ED with cough and fever. PMH significant for bronchiectasis, COPD, asthma, hypertension, ovarian cancer, and type 2 diabetes mellitus. Chest xray revealed L lobar PNA.  Spoke with pt and daughter at bedside. Pt reports that her appetite is "up and down." Pt endorses eating 2 meals daily. Pt reports that she typically skips breakfast and just has coffee. A typical lunch may include tuna salad, egg salad, or peanut butter toast. Pt reports she dislikes peanut butter but eats it "for the protein." A typical dinner may include rotisserie chicken or salmon with vegetables. Pt endorses purchasing oral nutrition supplements for home use but denies consuming them. Pt agreeable to trying Glucerna during admission.  Pt states that "the food is good" and reports consuming 75% of breakfast. No meal completion noted in pt's chart.  Pt endorses weight loss, stating that her UBW is 160-170 lbs and that she last weighed this 5 months ago; however, this does not align with weight history in chart that lists 131 lbs in Feburary 2010. Last measured weight above 160 lbs was charted in September 2017.  Pt and daughter with questions about diabetes nutrition therapy. All questions answered.  Medications reviewed and include: 20 mg  Pepcid BID, Ensure Enlive BID, sliding scale Novolog, 30 units Lantus daily, 50 mcg levothyroxine daily  Labs reviewed. CBG's: 101, 178, 101  NUTRITION - FOCUSED PHYSICAL EXAM:    Most Recent Value  Orbital Region  Mild depletion  Upper Arm Region  Mild depletion  Thoracic and Lumbar Region  No depletion  Buccal Region  Mild depletion  Temple Region  Mild depletion  Clavicle Bone Region  Mild depletion  Clavicle and Acromion Bone Region  Moderate depletion  Scapular Bone Region  Unable to assess  Dorsal Hand  Mild depletion  Patellar Region  Mild depletion  Anterior Thigh Region  Moderate depletion  Posterior Calf Region  Mild depletion  Edema (RD Assessment)  None  Hair  Reviewed  Eyes  Reviewed  Mouth  Reviewed  Skin  Reviewed  Nails  Reviewed       Diet Order:   Diet Order           Diet Carb Modified Fluid consistency: Thin; Room service appropriate? Yes  Diet effective now          EDUCATION NEEDS:   Education needs have been addressed  Skin:  Skin Assessment: Reviewed RN Assessment  Last BM:  10/08/17  Height:   Ht Readings from Last 1 Encounters:  10/09/17 5' (1.524 m)    Weight:   Wt Readings from Last 1 Encounters:  10/09/17 148 lb 9.4 oz (67.4 kg)    Ideal Body Weight:  45.45 kg  BMI:  Body mass index is 29.02 kg/m.  Estimated Nutritional Needs:   Kcal:  1550-1750 kcal/day  Protein:  75-90 grams/day  Fluid:  >/= 1.6 L/day    Gaynell Face, MS, RD, LDN Pager: [phone] Weekend/After Hours: [phone]

## 2017-10-11 ENCOUNTER — Encounter (HOSPITAL_COMMUNITY): Payer: Self-pay | Admitting: Radiology

## 2017-10-11 ENCOUNTER — Inpatient Hospital Stay (HOSPITAL_COMMUNITY): Payer: Medicare Other

## 2017-10-11 LAB — GLUCOSE, CAPILLARY
Glucose-Capillary: 104 mg/dL — ABNORMAL HIGH (ref 70–99)
Glucose-Capillary: 180 mg/dL — ABNORMAL HIGH (ref 70–99)
Glucose-Capillary: 83 mg/dL (ref 70–99)
Glucose-Capillary: 140 mg/dL — ABNORMAL HIGH (ref 70–99)

## 2017-10-11 LAB — CBC
HCT: 34.2 % — ABNORMAL LOW (ref 36.0–46.0)
Hemoglobin: 10.7 g/dL — ABNORMAL LOW (ref 12.0–15.0)
MCH: 33.4 pg (ref 26.0–34.0)
MCHC: 31.3 g/dL (ref 30.0–36.0)
MCV: 106.9 fL — ABNORMAL HIGH (ref 78.0–100.0)
Platelets: 270 10*3/uL (ref 150–400)
RBC: 3.2 MIL/uL — ABNORMAL LOW (ref 3.87–5.11)
RDW: 12.9 % (ref 11.5–15.5)
WBC: 15.7 10*3/uL — ABNORMAL HIGH (ref 4.0–10.5)

## 2017-10-11 MED ORDER — HYDROCODONE-ACETAMINOPHEN 5-325 MG PO TABS: 1.0000 | ORAL_TABLET | ORAL | Status: DC | PRN

## 2017-10-11 MED ORDER — IPRATROPIUM-ALBUTEROL 0.5-2.5 (3) MG/3ML IN SOLN
3.0000 mL | Freq: Two times a day (BID) | RESPIRATORY_TRACT | Status: DC
  Administered 2017-10-11 – 2017-10-12 (×2): 3 mL via RESPIRATORY_TRACT
  Filled 2017-10-11 (×2): qty 3

## 2017-10-11 MED ORDER — BENZONATATE 100 MG PO CAPS: 100.0000 mg | ORAL_CAPSULE | Freq: Three times a day (TID) | ORAL | Status: DC | PRN

## 2017-10-11 NOTE — Care Management Note (Signed)
Case Management Note  Patient Details  Name: Joleena Weisenburger MRN: [id number] Date of Birth: 02-05-1936  Subjective/Objective:     82 yo admitted with Pneumonia. Hx of COPD.              Action/Plan: Pt has home 02 but per report from pt and family she doesn't use it. PT recommendations gone over with pt at bedside and choice offered for home health services. Pt states she has used [facility] in the past and would like to use them again. [facility] rep alerted of referral. Will need MD orders for HHPT at discharge.  Expected Discharge Date:                  Expected Discharge Plan:  [place]  In-House Referral:     Discharge planning Services  CM Consult  Post Acute Care Choice:  Home Health Choice offered to:  Patient  DME Arranged:    DME Agency:     HH Arranged:  PT HH Agency:  [facility]  Status of Service:  In process, will continue to follow  If discussed at Long Length of Stay Meetings, dates discussed:    Additional CommentsLynnell Catalan, RN 10/11/2017, 2:27 PM  [phone]

## 2017-10-11 NOTE — Progress Notes (Signed)
PROGRESS NOTE    Kayla Thompson  XBL:[id number] DOB: [birth year] DOA: 10/09/2017 PCP: [name], MD     Brief Narrative:  Kayla Thompson is a 82 y.o. female with medical history significant of COPD, on home O2 as needed, fibromyalgia, HTN, HLD, DM who presented to [facility] with chief complaint of cough and shortness of breath.  She states that she always has a cough.  However in the last 2 days, her productive cough has increased in nature.  She also admits to shortness of breath, central chest pain that rates at about an 8 and is a pressure sensation in nature.  She complains of a headache, nausea without vomiting or diarrhea or abdominal pain.  She denies any peripheral edema.  No fevers or chills at home.  In the ED, her pulse ox was in the 80s and was placed on [place] O2.  She had a leukocytosis of 20.3.  Chest x-ray revealed left lobar pneumonia.  She was transferred to [facility] for further treatment and care.  New events last 24 hours / Subjective: Had an unwitnessed fall last night. She states she was trying to get some pads and sheets to change the bed when she lost her balance and fell on her wrists and knees. This morning, she underwent xrays which all were negative for acute fractures. She complains of left knee swelling. Breathing is better, cough has improved.   Assessment & Plan:   Principal Problem:   Pneumonia Active Problems:   Hypertension   HLD (hyperlipidemia)   Hypothyroidism   Fibromyalgia   Type 2 diabetes mellitus with hyperglycemia, with long-term current use of insulin (HCC)   Memory changes   Malnutrition of moderate degree   Sepsis secondary to left lobar pneumonia -Blood cultures negative to date -Sputum culture pending  -Continue rocephin/azithromycin   Acute on chronic hypoxemic respiratory failure  -Patient reports that she does not always wear her oxygen at home.  She is supposed to use it as needed.   -Continue [place] O2,  wean as able  COPD without exacerbation -Continue nebulizer treatments, dulera, spiriva  -Chronic prednisone 2mg  daily   Atypical chest pain -Reproducible with palpation -EKG without acute ST elevation -Trop neg x 3   Diabetes mellitus type 2 -Continue Lantus, sliding scale insulin  Hypothyroidism -Continue Synthroid  Hyperlipidemia -Continue Crestor  Essential hypertension -Continue Coreg  Fibromyalgia -Continue Cymbalta  Dementia -Continue Aricept  Fall -Xrays without acute fractures  -PT OT     DVT prophylaxis: Lovenox Code Status: Full Family Communication: Daughter at bedside  Disposition Plan: Will keep another night due to fall overnight, need PT OT again to evaluate. Continue to wean O2. Possibly discharge 7/24 if improved and stable.    Consultants:   None  Procedures:   None   Antimicrobials:  Anti-infectives (From admission, onward)   Start     Dose/Rate Route Frequency Ordered Stop   10/10/17 1400  cefTRIAXone (ROCEPHIN) 1 g in sodium chloride 0.9 % 100 mL IVPB     1 g 200 mL/hr over 30 Minutes Intravenous Every 24 hours 10/09/17 1807 10/17/17 1359   10/09/17 1830  azithromycin (ZITHROMAX) tablet 500 mg     500 mg Oral Every 24 hours 10/09/17 1807 10/16/17 1829   10/09/17 1330  cefTRIAXone (ROCEPHIN) 1 g in sodium chloride 0.9 % 100 mL IVPB     1 g 200 mL/hr over 30 Minutes Intravenous  Once 10/09/17 1320 10/09/17 1532  10/09/17 1330  doxycycline (VIBRA-TABS) tablet 100 mg     100 mg Oral  Once 10/09/17 1320 10/09/17 1424       Objective: Vitals:   10/10/17 1523 10/10/17 1755 10/10/17 2059 10/11/17 0449  BP:  135/72 133/62 115/74  Pulse:  81 87 79  Resp:   15 16  Temp:   99 F (37.2 C) 98 F (36.7 C)  TempSrc:   Oral Oral  SpO2: 100%  100% 99%  Weight:      Height:        Intake/Output Summary (Last 24 hours) at 10/11/2017 0926 Last data filed at 10/11/2017 0500 Gross per 24 hour  Intake 366.33 ml  Output 200 ml    Net 166.33 ml   Filed Weights   10/09/17 1249 10/09/17 1715  Weight: 69.4 kg (153 lb) 67.4 kg (148 lb 9.4 oz)    Examination: General exam: Appears calm and comfortable  Respiratory system: Clear to auscultation. Respiratory effort normal. Cardiovascular system: S1 & S2 heard, RRR. No JVD, murmurs, rubs, gallops or clicks. No pedal edema. Gastrointestinal system: Abdomen is nondistended, soft and nontender. No organomegaly or masses felt. Normal bowel sounds heard. Central nervous system: Alert and oriented. No focal neurological deficits. Extremities: Left knee with swelling, not TTP Skin: No rashes, lesions or ulcers Psychiatry: Judgement and insight appear normal. Mood & affect appropriate.    Data Reviewed: I have personally reviewed following labs and imaging studies  CBC: Recent Labs  Lab 10/09/17 1318 10/10/17 0636 10/11/17 0521  WBC 20.3* 18.5* 15.7*  NEUTROABS 15.8*  --   --   HGB 11.8* 11.2* 10.7*  HCT 36.2 35.3* 34.2*  MCV 106.5* 106.6* 106.9*  PLT 271 264 081   Basic Metabolic Panel: Recent Labs  Lab 10/09/17 1318 10/10/17 0636  NA 135 145  K 4.0 3.9  CL 100 105  CO2 25 29  GLUCOSE 139* 115*  BUN 16 13  CREATININE 0.88 0.79  CALCIUM 8.7* 9.0   GFR: Estimated Creatinine Clearance: 48.1 mL/min (by C-G formula based on SCr of 0.79 mg/dL). Liver Function Tests: Recent Labs  Lab 10/09/17 1318  AST 18  ALT 23  ALKPHOS 65  BILITOT 0.9  PROT 6.9  ALBUMIN 3.4*   No results for input(s): LIPASE, AMYLASE in the last 168 hours. No results for input(s): AMMONIA in the last 168 hours. Coagulation Profile: No results for input(s): INR, PROTIME in the last 168 hours. Cardiac Enzymes: Recent Labs  Lab 10/09/17 1842 10/10/17 0012 10/10/17 0636  TROPONINI <0.03 <0.03 <0.03   BNP (last 3 results) No results for input(s): PROBNP in the last 8760 hours. HbA1C: No results for input(s): HGBA1C in the last 72 hours. CBG: Recent Labs  Lab  10/10/17 0753 10/10/17 1153 10/10/17 1714 10/10/17 2101 10/11/17 0907  GLUCAP 101* 194* 127* 145* 104*   Lipid Profile: No results for input(s): CHOL, HDL, LDLCALC, TRIG, CHOLHDL, LDLDIRECT in the last 72 hours. Thyroid Function Tests: No results for input(s): TSH, T4TOTAL, FREET4, T3FREE, THYROIDAB in the last 72 hours. Anemia Panel: No results for input(s): VITAMINB12, FOLATE, FERRITIN, TIBC, IRON, RETICCTPCT in the last 72 hours. Sepsis Labs: Recent Labs  Lab 10/09/17 1330  LATICACIDVEN 1.38    Recent Results (from the past 240 hour(s))  Culture, blood (routine x 2)     Status: None (Preliminary result)   Collection Time: 10/09/17  1:18 PM  Result Value Ref Range Status   Specimen Description   Final  BLOOD RIGHT ARM Performed at [facility], [address] [place], [place] [address]    Special Requests   Final    BOTTLES DRAWN AEROBIC AND ANAEROBIC Blood Culture adequate volume Performed at [facility] [address] [place], [place] [address]    Culture   Final    NO GROWTH 1 DAY Performed at [facility], [place] [address], [place], [place] [address]    Report Status PENDING  Incomplete  Culture, blood (routine x 2)     Status: None (Preliminary result)   Collection Time: 10/09/17  2:19 PM  Result Value Ref Range Status   Specimen Description   Final    BLOOD RIGHT HAND Performed at [facility], [address] [place], [place] [address]    Special Requests   Final    BOTTLES DRAWN AEROBIC AND ANAEROBIC Blood Culture adequate volume Performed at [facility], [address] [place], [place] [address]    Culture   Final    NO GROWTH < 24 HOURS Performed at [facility], [place] [address], [place], [place] [address]    Report Status PENDING  Incomplete  Culture, sputum-assessment     Status: None   Collection Time: 10/10/17 12:45 AM  Result Value Ref Range Status   Specimen Description EXPECTORATED  SPUTUM  Final   Special Requests NONE  Final   Sputum evaluation   Final    THIS SPECIMEN IS ACCEPTABLE FOR SPUTUM CULTURE Performed at [facility], [place] [address], [place], [place] [address]    Report Status 10/10/2017 FINAL  Final  Culture, respiratory (NON-Expectorated)     Status: None (Preliminary result)   Collection Time: 10/10/17 12:45 AM  Result Value Ref Range Status   Specimen Description   Final    EXPECTORATED SPUTUM Performed at [place] [address], [place], [place] [address]    Special Requests   Final    NONE Reflexed from [id number] Performed at [facility], [place] [address], [place], [place] [address]    Gram Stain   Final    NO WBC SEEN FEW SQUAMOUS EPITHELIAL CELLS PRESENT RARE GRAM POSITIVE COCCI IN PAIRS Performed at [facility], [place] [address] [place], [place] [address]    Culture PENDING  Incomplete   Report Status PENDING  Incomplete       Radiology Studies: Dg Chest 2 View  Result Date: 10/09/2017 CLINICAL DATA:  Cough EXAM: CHEST - 2 VIEW COMPARISON:  09/19/2017 FINDINGS: Increased left lower lobe nodular bronchovascular opacity suspicious for pneumonia when compared to the prior study. Normal heart size and vascularity. Right lung remains clear. No large effusion or pneumothorax. Rib fracture deformities bilaterally. Degenerative changes of the spine and shoulders, worse on the left. Trachea is midline. Aorta is atherosclerotic. IMPRESSION: Left lower lobe nodular bronchovascular opacities suspicious for pneumonia. Electronically Signed   By: Jerilynn Mages.  Shick M.D.   On: 10/09/2017 14:04   Dg Wrist Complete Left  Result Date: 10/11/2017 CLINICAL DATA:  [place] yesterday with hip, wrist, and knee pain EXAM: LEFT WRIST - COMPLETE 3+ VIEW COMPARISON:  None. FINDINGS: There is deformity of the distal left radius which appears old consistent with old impaction fracture with healing. No acute  cortical disruption is seen. There are significant degenerative changes along the radial aspect of the carpal bones particularly involving the articulation of the navicula with  the trapezium where there is compression of the trapezium and degenerative change. No acute fracture is seen. IMPRESSION: 1. Probable old healed fracture of the distal left radius. 2. Significant degenerative change in the radial aspect of the wrist. Electronically Signed   By: Ivar Drape M.D.   On: 10/11/2017 08:40   Dg Wrist Complete Right  Result Date: 10/11/2017 CLINICAL DATA:  [place] yesterday with wrist pain bilaterally EXAM: RIGHT WRIST - COMPLETE 3+ VIEW COMPARISON:  None. FINDINGS: There is and old healed fracture of the distal right radius present. Significant degenerative changes present along the radial aspect of the wrist at the articulation of the navicula with the trapezium and the trapezium with the first metacarpal with loss of joint space and sclerosis. No acute fracture is seen. Chondrocalcinosis of the triangular fibrocartilage is present which may indicate CPPD arthropathy. IMPRESSION: 1. No acute fracture. 2. Old deformity of the distal right radius from prior fracture. 3. Degenerative change along the radial aspect of the wrist. 4. Chondrocalcinosis consistent with CPPD arthropathy. Electronically Signed   By: Ivar Drape M.D.   On: 10/11/2017 08:42   Ct Cervical Spine Wo Contrast  Result Date: 10/11/2017 CLINICAL DATA:  Fall, neck pain EXAM: CT CERVICAL SPINE WITHOUT CONTRAST TECHNIQUE: Multidetector CT imaging of the cervical spine was performed without intravenous contrast. Multiplanar CT image reconstructions were also generated. COMPARISON:  08/02/2014 FINDINGS: Alignment: Slight anterolisthesis of C2 on C3 and C7 on T1 related to facet disease. Skull base and vertebrae: No acute fracture. No primary bone lesion or focal pathologic process. Soft tissues and spinal canal: No prevertebral fluid or swelling. No  visible canal hematoma. Disc levels: Severe diffuse degenerative disc disease and facet disease bilaterally. Upper chest: No acute findings Other: No acute findings IMPRESSION: Advanced diffuse degenerative disc and facet disease. Associated slight grade 1 anterolisthesis of C2 on C3 and C7 on T1. No acute bony abnormality. Electronically Signed   By: Rolm Baptise M.D.   On: 10/11/2017 08:54   Dg Hips Bilat With Pelvis 3-4 Views  Result Date: 10/11/2017 CLINICAL DATA:  Fall yesterday.  Bilateral hip pain. EXAM: DG HIP (WITH OR WITHOUT PELVIS) 3-4V BILAT COMPARISON:  CT pelvis 05/19/2017 FINDINGS: Hardware noted in the right femur related to old fracture. Old bilateral inferior pubic rami and right superior pubic ramus fractures noted. No acute fracture. No subluxation or dislocation. Mild degenerative changes in the hips bilaterally. IMPRESSION: Evidence of multiple remote fractures as above. No acute bony abnormality. Electronically Signed   By: Rolm Baptise M.D.   On: 10/11/2017 08:36   Dg Knee 3 Views Left  Result Date: 10/11/2017 CLINICAL DATA:  [place] yesterday with hip, wrist, and knee pain EXAM: LEFT KNEE - 3 VIEW COMPARISON:  MR left knee of 09/12/2016 FINDINGS: There is tricompartmental degenerative joint disease of the left knee primarily involving the medial compartment, where there is more loss of joint space, sclerosis, and spurring present. There is spurring from both the patellofemoral and lateral compartments as well with better preservation of joint spaces. Linear calcification in the knee is consistent with chondrocalcinosis indicative of CPPD arthropathy. No joint effusion is seen and no fracture is noted. IMPRESSION: 1. Tricompartmental degenerative joint disease of the left knee primarily involving the medial compartment. 2. No fracture or effusion. 3. Calcification consistent with CPPD arthropathy. Electronically Signed   By: Ivar Drape M.D.   On: 10/11/2017 08:39   Dg Knee 3 View  Right  Result Date: 10/11/2017 CLINICAL DATA:  Fell yesterday with hip, knee, and wrist pain EXAM: RIGHT KNEE - 3 VIEW COMPARISON:  None. FINDINGS: The femoral and tibial components of the right total knee replacement appear to be in good position. No fracture is seen. No joint effusion is noted. IMPRESSION: Stable appearance of right total knee replacement components. No acute abnormality. Electronically Signed   By: Ivar Drape M.D.   On: 10/11/2017 08:37      Scheduled Meds: . azithromycin  500 mg Oral Q24H  . carvedilol  3.125 mg Oral BID WC  . cycloSPORINE  1 drop Both Eyes BID  . donepezil  5 mg Oral QHS  . DULoxetine  90 mg Oral Daily  . enoxaparin (LOVENOX) injection  40 mg Subcutaneous Q24H  . famotidine  20 mg Oral BID  . feeding supplement (GLUCERNA SHAKE)  237 mL Oral TID WC  . fluticasone  2 spray Each Nare Daily  . insulin aspart  0-5 Units Subcutaneous QHS  . insulin aspart  0-9 Units Subcutaneous TID WC  . insulin glargine  20 Units Subcutaneous QHS  . ipratropium-albuterol  3 mL Nebulization TID  . levothyroxine  50 mcg Oral QAC breakfast  . Melatonin  5 mg Oral QHS  . mometasone-formoterol  2 puff Inhalation BID  . montelukast  10 mg Oral QHS  . ofloxacin  1 drop Right Eye BID  . pantoprazole  40 mg Oral Daily  . predniSONE  2 mg Oral Q breakfast  . rosuvastatin  10 mg Oral QPM  . sodium chloride flush  3 mL Intravenous Q12H  . tiotropium  18 mcg Inhalation Daily  . zolpidem  5 mg Oral QHS   Continuous Infusions: . sodium chloride    . cefTRIAXone (ROCEPHIN)  IV Stopped (10/10/17 1400)     LOS: 2 days    Time spent: 25 minutes   Dessa Phi, DO [facility] [email or web address] Password [facility] 10/11/2017, 9:26 AM

## 2017-10-11 NOTE — Progress Notes (Signed)
Daughter at bedside, requesting full body scan rather than xray due to pt's frequent falls and fragile bones.

## 2017-10-11 NOTE — Progress Notes (Signed)
Physical Therapy Treatment Patient Details Name: Kayla Thompson MRN: [id number] DOB: 1935/06/14 Today's Date: 10/11/2017    History of Present Illness 82 yo female admitted with pna. Hx of COPD, fibromyalgia, HTN, DM, dementia    PT Comments    Pt participated well. No LOB with walker use. No c/o pain.    Follow Up Recommendations  Home health PT;Supervision - Intermittent     Equipment Recommendations  None recommended by PT    Recommendations for Other Services       Precautions / Restrictions Precautions Precautions: Fall Restrictions Weight Bearing Restrictions: No    Mobility  Bed Mobility Overal bed mobility: Needs Assistance Bed Mobility: Supine to Sit;Sit to Supine     Supine to sit: Supervision Sit to supine: Supervision   General bed mobility comments: for safety, lines  Transfers Overall transfer level: Needs assistance Equipment used: 4-wheeled walker Transfers: Sit to/from Stand Sit to Stand: Supervision         General transfer comment: for safety  Ambulation/Gait Ambulation/Gait assistance: Supervision Gait Distance (Feet): 160 Feet Assistive device: 4-wheeled walker Gait Pattern/deviations: Step-through pattern;Decreased stride length     General Gait Details: for safety.    Stairs             Wheelchair Mobility    Modified Rankin (Stroke Patients Only)       Balance                                            Cognition Arousal/Alertness: Awake/alert Behavior During Therapy: WFL for tasks assessed/performed Overall Cognitive Status: History of cognitive impairments - at baseline                                        Exercises      General Comments        Pertinent Vitals/Pain Pain Assessment: No/denies pain    Home Living                      Prior Function            PT Goals (current goals can now be found in the care plan section) Progress towards PT  goals: Progressing toward goals    Frequency    Min 3X/week      PT Plan Current plan remains appropriate    Co-evaluation              AM-PAC PT "6 Clicks" Daily Activity  Outcome Measure  Difficulty turning over in bed (including adjusting bedclothes, sheets and blankets)?: None Difficulty moving from lying on back to sitting on the side of the bed? : None Difficulty sitting down on and standing up from a chair with arms (e.g., wheelchair, bedside commode, etc,.)?: None Help needed moving to and from a bed to chair (including a wheelchair)?: A Little Help needed walking in hospital room?: A Little Help needed climbing 3-5 steps with a railing? : A Little 6 Click Score: 21    End of Session Equipment Utilized During Treatment: Gait belt Activity Tolerance: Patient tolerated treatment well Patient left: in bed;with call bell/phone within reach;with bed alarm set(tele-sitter)   PT Visit Diagnosis: Muscle weakness (generalized) (M62.81)     Time: 3419-3790 PT Time Calculation (min) (ACUTE ONLY): 18  min  Charges:  $Gait Training: 8-22 mins                    G Codes:          Weston Anna, MPT Pager: [phone]

## 2017-10-11 NOTE — Progress Notes (Addendum)
Pt called RN phone around [phone] and asked RN to come to her room. RN went to room and pt was resting in bed. Patient stated "I had a fall over there near the closet, my bed was wet due to the thing not working (purwick) and I went to get a pad from the couch to change the bed." Pt states that she lost her balance and fell on her knees and pulled herself up by the couch in the room. When asked why pt didn't notify staff, pt stated "I didn't want to bother you all, you might be out there resting or sleeping." TRH paged and notified at 0418 and daughter [name] notified at 32. Daughter states that mom has a hx of osteoporosis, diabetes and a knee replacement, states that mom breaks easily so she would feel comfortable if mom had an x-ray done to confirm or deny injuries. TR[facility] notified of x-ray need also.  Add on - When pt's daughter came pt told her that she broke her fall with her hands (information was not disclosed to RN when she was assessed)

## 2017-10-11 NOTE — Progress Notes (Signed)
OT Cancellation Note  Patient Details Name: [name] MRN: [id number] DOB: 1935-08-29   Cancelled Treatment:    Reason Eval/Treat Not Completed: Fatigue/lethargy limiting ability to participate. Will check back another day.  Alixandra Alfieri 10/11/2017, 2:08 PM  Lesle Chris, OTR/L [phone] 10/11/2017

## 2017-10-12 DIAGNOSIS — J181 Lobar pneumonia, unspecified organism: Secondary | ICD-10-CM

## 2017-10-12 LAB — CBC
HCT: 32.4 % — ABNORMAL LOW (ref 36.0–46.0)
Hemoglobin: 10.3 g/dL — ABNORMAL LOW (ref 12.0–15.0)
MCH: 33.8 pg (ref 26.0–34.0)
MCHC: 31.8 g/dL (ref 30.0–36.0)
MCV: 106.2 fL — ABNORMAL HIGH (ref 78.0–100.0)
Platelets: 305 10*3/uL (ref 150–400)
RBC: 3.05 MIL/uL — ABNORMAL LOW (ref 3.87–5.11)
RDW: 12.8 % (ref 11.5–15.5)
WBC: 12.9 10*3/uL — ABNORMAL HIGH (ref 4.0–10.5)

## 2017-10-12 LAB — CULTURE, RESPIRATORY W GRAM STAIN: Culture: NORMAL

## 2017-10-12 LAB — GLUCOSE, CAPILLARY
Glucose-Capillary: 96 mg/dL (ref 70–99)
Glucose-Capillary: 169 mg/dL — ABNORMAL HIGH (ref 70–99)

## 2017-10-12 LAB — CULTURE, RESPIRATORY: Gram Stain: NONE SEEN

## 2017-10-12 MED ORDER — CEFDINIR 300 MG PO CAPS: 300.0000 mg | ORAL_CAPSULE | Freq: Two times a day (BID) | ORAL | 0 refills | Status: AC

## 2017-10-12 NOTE — Discharge Instructions (Signed)

## 2017-10-12 NOTE — Care Management Important Message (Signed)
Important Message  Patient Details  Name: Kayla Thompson MRN: [id number] Date of Birth: 04-15-1935   Medicare Important Message Given:  Yes    Kerin Salen 10/12/2017, 10:25 [place] Message  Patient Details  Name: Kayla Thompson MRN: [id number] Date of Birth: Sep 08, 1935   Medicare Important Message Given:  Yes    Kerin Salen 10/12/2017, 10:25 AM

## 2017-10-12 NOTE — Discharge Summary (Signed)
Physician Discharge Summary  Kayla Thompson KXF:[id number] DOB: December 04, 1935 DOA: 10/09/2017  PCP: [name], MD  Admit date: 10/09/2017 Discharge date: 10/12/2017  Admitted From: Home Disposition: Home Recommendations for Outpatient Follow-up:  1. Follow up with PCP in 1-2 weeks 2. Please obtain BMP/CBC in one week   Home Health: Home health PT Equipment/Devices: None  Discharge Condition none CODE STATUS: Full Diet recommendation: Regular  Brief/Interim Summary:  #) Community acquired pneumonia: Patient was admitted with community acquired pneumonia.  She was given IV ceftriaxone and azithromycin and completed 3 days of azithromycin.  She was discharged home on oral Ceftin ear to complete a total of 7 days.  Her blood cultures were negative and her sputum cultures were negative.  #) COPD: Patient was continued on PRN bronchodilators, ICS/LABA and L AMA.  She did not require any steroids as she was not wheezing.  #) Type 2 diabetes: Patient was continued on home glargine and lispro.  #) Pain/psych: Patient was maintained on home duloxetine, donepezil.  #) Hypertension/hyperlipidemia: Patient was maintained on rosuvastatin, carvedilol.  #) Hypothyroidism: Patient was maintained on home levothyroxine.    Discharge Diagnoses:  Principal Problem:   Pneumonia Active Problems:   Hypertension   HLD (hyperlipidemia)   Hypothyroidism   Fibromyalgia   Type 2 diabetes mellitus with hyperglycemia, with long-term current use of insulin (HCC)   Memory changes   Malnutrition of moderate degree    Discharge Instructions  Discharge Instructions    Call MD for:  difficulty breathing, headache or visual disturbances   Complete by:  As directed    Call MD for:  extreme fatigue   Complete by:  As directed    Call MD for:  persistant nausea and vomiting   Complete by:  As directed    Call MD for:  temperature >100.4   Complete by:  As directed    Diet - low sodium heart  healthy   Complete by:  As directed    Discharge instructions   Complete by:  As directed    Please follow-up with your primary care doctor in 1 to 2 weeks.  Please take antibiotics as prescribed.   Increase activity slowly   Complete by:  As directed      Allergies as of 10/12/2017      Reactions   Latex Rash   Penicillins Rash   Has patient had a PCN reaction causing immediate rash, facial/tongue/throat swelling, SOB or lightheadedness with hypotension: No Has patient had a PCN reaction causing severe rash involving mucus membranes or skin necrosis: No Has patient had a PCN reaction that required hospitalization: No Has patient had a PCN reaction occurring within the last 10 years: No If all of the above answers are "NO", then may proceed with Cephalosporin use.      Medication List    TAKE these medications   albuterol 108 (90 Base) MCG/ACT inhaler Commonly known as:  PROVENTIL HFA;VENTOLIN HFA Inhale 2 puffs into the lungs every 6 (six) hours as needed for wheezing or shortness of breath.   Apoaequorin 10 MG Caps Commonly known as:  PREVAGEN Take 10 m by mouth daily.   BD PEN NEEDLE NANO U/F 32G X 4 MM Misc Generic drug:  Insulin Pen Needle USE 4 TIMES A DAY   calcium carbonate 1500 (600 Ca) MG Tabs tablet Commonly known as:  OSCAL Take 1,500 mg by mouth 2 (two) times daily with a meal.   CALTRATE 600+D PLUS MINERALS PO Take 1  tablet by mouth 2 (two) times daily.   carvedilol 6.25 MG tablet Commonly known as:  COREG Take 0.5 tablets (3.125 mg total) by mouth 2 (two) times daily with a meal.   cefdinir 300 MG capsule Commonly known as:  OMNICEF Take 1 capsule (300 mg total) by mouth 2 (two) times daily for 5 days.   CYMBALTA PO Take 90 mg by mouth daily.   denosumab 60 MG/ML Soln injection Commonly known as:  PROLIA Inject 60 mg into the skin every 6 (six) months. Administer in upper arm, thigh, or abdomen   donepezil 5 MG tablet Commonly known as:   ARICEPT TAKE 1 TABLET (5 MG TOTAL) BY MOUTH AT BEDTIME.   fluticasone 50 MCG/ACT nasal spray Commonly known as:  FLONASE Place 2 sprays into both nostrils daily.   Fluticasone-Salmeterol 500-50 MCG/DOSE Aepb Commonly known as:  ADVAIR Inhale 1 puff into the lungs 2 (two) times daily.   freestyle lancets Use to test blood sugar 3 times daily. Dx: E11.65   glucose blood test strip Commonly known as:  FREESTYLE TEST STRIPS Use to test blood sugar 3 times daily. Dx: E11.65   insulin lispro 100 UNIT/ML KiwkPen Commonly known as:  HUMALOG KWIKPEN Inject 4-6 units into the skin daily. What changed:    how much to take  how to take this  when to take this  additional instructions   ipratropium-albuterol 0.5-2.5 (3) MG/3ML Soln Commonly known as:  DUONEB Take 3 mLs by nebulization 3 (three) times daily.   insulin glargine 100 UNIT/ML injection Commonly known as:  LANTUS Inject 20 Units into the skin every morning.   LANTUS SOLOSTAR 100 UNIT/ML Solostar Pen Generic drug:  Insulin Glargine INJECT 30 UNITS SUBCUTANEOUSLY EVERY MORNING   levothyroxine 50 MCG tablet Commonly known as:  SYNTHROID, LEVOTHROID Take 1 tablet (50 mcg total) by mouth daily before breakfast.   Melatonin 5 MG Tabs Take 5 mg by mouth at bedtime.   montelukast 10 MG tablet Commonly known as:  SINGULAIR TAKE 1 TABLET (10 MG TOTAL) BY MOUTH DAILY.   MUCINEX DM MAXIMUM STRENGTH 60-1200 MG Tb12 Take 1 tablet by mouth 2 (two) times daily. Reported on 06/23/2015   omeprazole 20 MG capsule Commonly known as:  PRILOSEC Take 20 mg by mouth daily.   predniSONE 1 MG tablet Commonly known as:  DELTASONE Take 2 mg by mouth daily.   ranitidine 150 MG tablet Commonly known as:  ZANTAC Take 150 mg by mouth 2 (two) times daily as needed for heartburn.   RESTASIS 0.05 % ophthalmic emulsion Generic drug:  cycloSPORINE Place 1 drop into both eyes 2 (two) times daily.   rosuvastatin 10 MG tablet Commonly  known as:  CRESTOR TAKE 1 TABLET (10 MG TOTAL) BY MOUTH EVERY EVENING.   SPIRIVA HANDIHALER 18 MCG inhalation capsule Generic drug:  tiotropium PLACE 1 CAPSULE INTO INHALER AND INHALE AS DIRECTED EVERY DAY   SYSTANE FREE OP Apply 1 drop to eye 3 (three) times daily.   trimethoprim 100 MG tablet Commonly known as:  TRIMPEX Take 100 mg by mouth daily.   vitamin B-12 250 MCG tablet Commonly known as:  CYANOCOBALAMIN Take 250 mcg by mouth daily.   vitamin C 1000 MG tablet Take 1,000 mg by mouth daily.   Vitamin D3 2000 units Tabs Take 2 tablets by mouth daily.   zolpidem 5 MG tablet Commonly known as:  AMBIEN Take 5 mg by mouth at bedtime.      Follow-up Information  [place] [place] Follow up.   Specialty:  [place] Why:  For home health physical therapy Contact information: [place] [address] [phone]          Allergies  Allergen Reactions  . Latex Rash  . Penicillins Rash    Has patient had a PCN reaction causing immediate rash, facial/tongue/throat swelling, SOB or lightheadedness with hypotension: No Has patient had a PCN reaction causing severe rash involving mucus membranes or skin necrosis: No Has patient had a PCN reaction that required hospitalization: No Has patient had a PCN reaction occurring within the last 10 years: No If all of the above answers are "NO", then may proceed with Cephalosporin use.     Consultations:  None   Procedures/Studies: Dg Chest 2 View  Result Date: 10/09/2017 CLINICAL DATA:  Cough EXAM: CHEST - 2 VIEW COMPARISON:  09/19/2017 FINDINGS: Increased left lower lobe nodular bronchovascular opacity suspicious for pneumonia when compared to the prior study. Normal heart size and vascularity. Right lung remains clear. No large effusion or pneumothorax. Rib fracture deformities bilaterally. Degenerative changes of the spine and shoulders, worse on the left. Trachea  is midline. Aorta is atherosclerotic. IMPRESSION: Left lower lobe nodular bronchovascular opacities suspicious for pneumonia. Electronically Signed   By: Jerilynn Mages.  Shick M.D.   On: 10/09/2017 14:04   Dg Chest 2 View  Result Date: 09/19/2017 CLINICAL DATA:  History of bronchiectasis, follow-up examination EXAM: CHEST - 2 VIEW COMPARISON:  04/07/2017 FINDINGS: Cardiac shadow is within normal limits. Bilateral healing rib fractures are noted callus formation particularly on left involving the anterior aspects of the third through the sixth ribs. No pneumothorax is noted. No other focal abnormality is seen. IMPRESSION: Healing rib fractures bilaterally particularly on the left. No acute abnormality is noted. Electronically Signed   By: Inez Catalina M.D.   On: 09/19/2017 15:51   Dg Wrist Complete Left  Result Date: 10/11/2017 CLINICAL DATA:  [place] yesterday with hip, wrist, and knee pain EXAM: LEFT WRIST - COMPLETE 3+ VIEW COMPARISON:  None. FINDINGS: There is deformity of the distal left radius which appears old consistent with old impaction fracture with healing. No acute cortical disruption is seen. There are significant degenerative changes along the radial aspect of the carpal bones particularly involving the articulation of the navicula with the trapezium where there is compression of the trapezium and degenerative change. No acute fracture is seen. IMPRESSION: 1. Probable old healed fracture of the distal left radius. 2. Significant degenerative change in the radial aspect of the wrist. Electronically Signed   By: Ivar Drape M.D.   On: 10/11/2017 08:40   Dg Wrist Complete Right  Result Date: 10/11/2017 CLINICAL DATA:  [place] yesterday with wrist pain bilaterally EXAM: RIGHT WRIST - COMPLETE 3+ VIEW COMPARISON:  None. FINDINGS: There is and old healed fracture of the distal right radius present. Significant degenerative changes present along the radial aspect of the wrist at the articulation of the navicula with  the trapezium and the trapezium with the first metacarpal with loss of joint space and sclerosis. No acute fracture is seen. Chondrocalcinosis of the triangular fibrocartilage is present which may indicate CPPD arthropathy. IMPRESSION: 1. No acute fracture. 2. Old deformity of the distal right radius from prior fracture. 3. Degenerative change along the radial aspect of the wrist. 4. Chondrocalcinosis consistent with CPPD arthropathy. Electronically Signed   By: Ivar Drape M.D.   On: 10/11/2017 08:42   Ct Cervical  Spine Wo Contrast  Result Date: 10/11/2017 CLINICAL DATA:  Fall, neck pain EXAM: CT CERVICAL SPINE WITHOUT CONTRAST TECHNIQUE: Multidetector CT imaging of the cervical spine was performed without intravenous contrast. Multiplanar CT image reconstructions were also generated. COMPARISON:  08/02/2014 FINDINGS: Alignment: Slight anterolisthesis of C2 on C3 and C7 on T1 related to facet disease. Skull base and vertebrae: No acute fracture. No primary bone lesion or focal pathologic process. Soft tissues and spinal canal: No prevertebral fluid or swelling. No visible canal hematoma. Disc levels: Severe diffuse degenerative disc disease and facet disease bilaterally. Upper chest: No acute findings Other: No acute findings IMPRESSION: Advanced diffuse degenerative disc and facet disease. Associated slight grade 1 anterolisthesis of C2 on C3 and C7 on T1. No acute bony abnormality. Electronically Signed   By: Rolm Baptise M.D.   On: 10/11/2017 08:54   Dg Hips Bilat With Pelvis 3-4 Views  Result Date: 10/11/2017 CLINICAL DATA:  Fall yesterday.  Bilateral hip pain. EXAM: DG HIP (WITH OR WITHOUT PELVIS) 3-4V BILAT COMPARISON:  CT pelvis 05/19/2017 FINDINGS: Hardware noted in the right femur related to old fracture. Old bilateral inferior pubic rami and right superior pubic ramus fractures noted. No acute fracture. No subluxation or dislocation. Mild degenerative changes in the hips bilaterally. IMPRESSION:  Evidence of multiple remote fractures as above. No acute bony abnormality. Electronically Signed   By: Rolm Baptise M.D.   On: 10/11/2017 08:36   Dg Knee 3 Views Left  Result Date: 10/11/2017 CLINICAL DATA:  [place] yesterday with hip, wrist, and knee pain EXAM: LEFT KNEE - 3 VIEW COMPARISON:  MR left knee of 09/12/2016 FINDINGS: There is tricompartmental degenerative joint disease of the left knee primarily involving the medial compartment, where there is more loss of joint space, sclerosis, and spurring present. There is spurring from both the patellofemoral and lateral compartments as well with better preservation of joint spaces. Linear calcification in the knee is consistent with chondrocalcinosis indicative of CPPD arthropathy. No joint effusion is seen and no fracture is noted. IMPRESSION: 1. Tricompartmental degenerative joint disease of the left knee primarily involving the medial compartment. 2. No fracture or effusion. 3. Calcification consistent with CPPD arthropathy. Electronically Signed   By: Ivar Drape M.D.   On: 10/11/2017 08:39   Dg Knee 3 View Right  Result Date: 10/11/2017 CLINICAL DATA:  [place] yesterday with hip, knee, and wrist pain EXAM: RIGHT KNEE - 3 VIEW COMPARISON:  None. FINDINGS: The femoral and tibial components of the right total knee replacement appear to be in good position. No fracture is seen. No joint effusion is noted. IMPRESSION: Stable appearance of right total knee replacement components. No acute abnormality. Electronically Signed   By: Ivar Drape M.D.   On: 10/11/2017 08:37       Subjective:   Discharge Exam: Vitals:   10/12/17 0448 10/12/17 1042  BP: (!) 102/46   Pulse: 81   Resp: 16   Temp: 98.1 F (36.7 C)   SpO2: 92% 90%   Vitals:   10/11/17 1914 10/11/17 2021 10/12/17 0448 10/12/17 1042  BP:  104/61 (!) 102/46   Pulse:  76 81   Resp:  17 16   Temp:  98.3 F (36.8 C) 98.1 F (36.7 C)   TempSrc:  Oral Oral   SpO2: 93% 96% 92% 90%   Weight:      Height:        General: Pt is alert, awake, not in acute distress Cardiovascular: Regular rate  and rhythm, no murmurs Respiratory: CTA bilaterally, no wheezing, scattered rhonchi on right base Abdominal: Soft, NT, ND, bowel sounds + Extremities: 1+ lower extremity edema   The results of significant diagnostics from this hospitalization (including imaging, microbiology, ancillary and laboratory) are listed below for reference.     Microbiology: Recent Results (from the past 240 hour(s))  Culture, blood (routine x 2)     Status: None (Preliminary result)   Collection Time: 10/09/17  1:18 PM  Result Value Ref Range Status   Specimen Description   Final    BLOOD RIGHT ARM Performed at [facility], [address] [place], [place] [address]    Special Requests   Final    BOTTLES DRAWN AEROBIC AND ANAEROBIC Blood Culture adequate volume Performed at [facility], [address] [place], [place] [address]    Culture   Final    NO GROWTH 2 DAYS Performed at [facility], [place] [address] [place], [place] [address]    Report Status PENDING  Incomplete  Culture, blood (routine x 2)     Status: None (Preliminary result)   Collection Time: 10/09/17  2:19 PM  Result Value Ref Range Status   Specimen Description   Final    BLOOD RIGHT HAND Performed at [facility], [address] [place], [place] [address]    Special Requests   Final    BOTTLES DRAWN AEROBIC AND ANAEROBIC Blood Culture adequate volume Performed at [facility], [address] [place], [place] [address]    Culture   Final    NO GROWTH 2 DAYS Performed at [facility], [place] [address] [place], [place] [address]    Report Status PENDING  Incomplete  Culture, sputum-assessment     Status: None   Collection Time: 10/10/17 12:45 AM  Result Value Ref Range Status   Specimen Description EXPECTORATED SPUTUM  Final   Special Requests NONE  Final    Sputum evaluation   Final    THIS SPECIMEN IS ACCEPTABLE FOR SPUTUM CULTURE Performed at [facility], [place] [address], [place], [place] [address]    Report Status 10/10/2017 FINAL  Final  Culture, respiratory (NON-Expectorated)     Status: None   Collection Time: 10/10/17 12:45 AM  Result Value Ref Range Status   Specimen Description   Final    EXPECTORATED SPUTUM Performed at [place] [address] [place], [place] [address]    Special Requests   Final    NONE Reflexed from [id number] Performed at [facility], [place] [address] [place], [place] [address]    Gram Stain   Final    NO WBC SEEN FEW SQUAMOUS EPITHELIAL CELLS PRESENT RARE GRAM POSITIVE COCCI IN PAIRS    Culture   Final    Consistent with normal respiratory flora. Performed at [facility], [place] [address], [place], [place] [address]    Report Status 10/12/2017 FINAL  Final     Labs: BNP (last 3 results) No results for input(s): BNP in the last 8760 hours. Basic Metabolic Panel: Recent Labs  Lab 10/09/17 1318 10/10/17 0636  NA 135 145  K 4.0 3.9  CL 100 105  CO2 25 29  GLUCOSE 139* 115*  BUN 16 13  CREATININE 0.88 0.79  CALCIUM 8.7* 9.0   Liver Function Tests: Recent Labs  Lab 10/09/17 1318  AST 18  ALT  23  ALKPHOS 65  BILITOT 0.9  PROT 6.9  ALBUMIN 3.4*   No results for input(s): LIPASE, AMYLASE in the last 168 hours. No results for input(s): AMMONIA in the last 168 hours. CBC: Recent Labs  Lab 10/09/17 1318 10/10/17 0636 10/11/17 0521 10/12/17 0408  WBC 20.3* 18.5* 15.7* 12.9*  NEUTROABS 15.8*  --   --   --   HGB 11.8* 11.2* 10.7* 10.3*  HCT 36.2 35.3* 34.2* 32.4*  MCV 106.5* 106.6* 106.9* 106.2*  PLT 271 264 270 305   Cardiac Enzymes: Recent Labs  Lab 10/09/17 1842 10/10/17 0012 10/10/17 0636  TROPONINI <0.03 <0.03 <0.03   BNP: Invalid input(s): POCBNP CBG: Recent Labs  Lab 10/11/17 1229 10/11/17 1724  10/11/17 2023 10/12/17 0748 10/12/17 1140  GLUCAP 180* 83 140* 96 169*   D-Dimer No results for input(s): DDIMER in the last 72 hours. Hgb A1c No results for input(s): HGBA1C in the last 72 hours. Lipid Profile No results for input(s): CHOL, HDL, LDLCALC, TRIG, CHOLHDL, LDLDIRECT in the last 72 hours. Thyroid function studies No results for input(s): TSH, T4TOTAL, T3FREE, THYROIDAB in the last 72 hours.  Invalid input(s): FREET3 Anemia work up No results for input(s): VITAMINB12, FOLATE, FERRITIN, TIBC, IRON, RETICCTPCT in the last 72 hours. Urinalysis    Component Value Date/Time   COLORURINE YELLOW 10/09/2017 1507   APPEARANCEUR CLEAR 10/09/2017 1507   LABSPEC 1.010 10/09/2017 1507   PHURINE 6.0 10/09/2017 1507   GLUCOSEU NEGATIVE 10/09/2017 1507   HGBUR TRACE (A) 10/09/2017 1507   BILIRUBINUR NEGATIVE 10/09/2017 1507   KETONESUR 15 (A) 10/09/2017 1507   PROTEINUR NEGATIVE 10/09/2017 1507   UROBILINOGEN 0.2 08/02/2014 0231   NITRITE NEGATIVE 10/09/2017 1507   LEUKOCYTESUR NEGATIVE 10/09/2017 1507   Sepsis Labs Invalid input(s): PROCALCITONIN,  WBC,  LACTICIDVEN Microbiology Recent Results (from the past 240 hour(s))  Culture, blood (routine x 2)     Status: None (Preliminary result)   Collection Time: 10/09/17  1:18 PM  Result Value Ref Range Status   Specimen Description   Final    BLOOD RIGHT ARM Performed at [facility], [address] [place], [place] [address]    Special Requests   Final    BOTTLES DRAWN AEROBIC AND ANAEROBIC Blood Culture adequate volume Performed at [facility], [address] [place], [place] [address]    Culture   Final    NO GROWTH 2 DAYS Performed at [facility], [place] [address] [place], [place] [address]    Report Status PENDING  Incomplete  Culture, blood (routine x 2)     Status: None (Preliminary result)   Collection Time: 10/09/17  2:19 PM  Result Value Ref Range Status   Specimen Description    Final    BLOOD RIGHT HAND Performed at [facility], [address] [place], [place] [address]    Special Requests   Final    BOTTLES DRAWN AEROBIC AND ANAEROBIC Blood Culture adequate volume Performed at [facility] [address] [place], [place] [address]    Culture   Final    NO GROWTH 2 DAYS Performed at [facility], [place] [address], [place], [place] [address]    Report Status PENDING  Incomplete  Culture, sputum-assessment     Status: None   Collection Time: 10/10/17 12:45 AM  Result Value Ref Range Status   Specimen Description EXPECTORATED SPUTUM  Final  Special Requests NONE  Final   Sputum evaluation   Final    THIS SPECIMEN IS ACCEPTABLE FOR SPUTUM CULTURE Performed at [place] [address], [place], [place] [address]    Report Status 10/10/2017 FINAL  Final  Culture, respiratory (NON-Expectorated)     Status: None   Collection Time: 10/10/17 12:45 AM  Result Value Ref Range Status   Specimen Description   Final    EXPECTORATED SPUTUM Performed at [place] [address], [place], [place] [address]    Special Requests   Final    NONE Reflexed from [id number] Performed at [facility], [place] [address] [place], [place] [address]    Gram Stain   Final    NO WBC SEEN FEW SQUAMOUS EPITHELIAL CELLS PRESENT RARE GRAM POSITIVE COCCI IN PAIRS    Culture   Final    Consistent with normal respiratory flora. Performed at [facility], [place] [address] [place], [place] [address]    Report Status 10/12/2017 FINAL  Final     Time coordinating discharge: Over 30 minutes  SIGNED:   Cristy Folks, MD  [facility] 10/12/2017, 12:59 PM   If 7PM-7AM, please contact night-coverage [email or web address] Password [email or web address]

## 2017-10-12 NOTE — Progress Notes (Signed)
Occupational Therapy Treatment Patient Details Name: Kayla Thompson MRN: [id number] DOB: [birth year] Today's Date: 10/12/2017    History of present illness 82 yo female admitted with pna. Hx of COPD, fibromyalgia, HTN, DM, dementia   OT comments    Follow Up Recommendations  Supervision/Assistance - 24 hour    Equipment Recommendations  None recommended by OT    Recommendations for Other Services      Precautions / Restrictions Precautions Precautions: Fall Restrictions Weight Bearing Restrictions: No       Mobility Bed Mobility Overal bed mobility: Needs Assistance Bed Mobility: Supine to Sit     Supine to sit: Supervision Sit to supine: Supervision      Transfers Overall transfer level: Needs assistance Equipment used: Rolling walker (2 wheeled) Transfers: Sit to/from [facility] Sit to Stand: Supervision Stand pivot transfers: Supervision       General transfer comment: for safety        ADL either performed or assessed with clinical judgement   ADL Overall ADL's : Needs assistance/impaired Eating/Feeding: Independent;Sitting   Grooming: Supervision/safety;Standing;Wash/dry Lobbyist Transfer: Ambulation;RW;Supervision/safety;Comfort height toilet   Toileting- Clothing Manipulation and Hygiene: Supervision/safety;Sit to/from stand       Functional mobility during ADLs: Min guard General ADL Comments: pt states daughter will A as needed     Vision Patient Visual Report: No change from baseline            Cognition Arousal/Alertness: Awake/alert Behavior During Therapy: WFL for tasks assessed/performed Overall Cognitive Status: History of cognitive impairments - at baseline                                                     Pertinent Vitals/ Pain       Pain Assessment: No/denies pain  Home Living                                          Prior  Functioning/Environment              Frequency  Min 2X/week        Progress Toward Goals  OT Goals(current goals can now be found in the care plan section)  Progress towards OT goals: Progressing toward goals     Plan      Co-evaluation                 AM-PAC PT "6 Clicks" Daily Activity     Outcome Measure   Help from another person eating meals?: None Help from another person taking care of personal grooming?: A Little Help from another person toileting, which includes using toliet, bedpan, or urinal?: A Little Help from another person bathing (including washing, rinsing, drying)?: A Little Help from another person to put on and taking off regular upper body clothing?: A Little Help from another person to put on and taking off regular lower body clothing?: A Lot 6 Click Score: 18    End of Session Equipment Utilized During Treatment: Rolling walker  OT Visit Diagnosis: Muscle weakness (generalized) (M62.81)   Activity Tolerance Patient tolerated treatment well   Patient Left in chair;with call bell/phone  within reach;with family/visitor present;with chair alarm set   Nurse Communication Mobility status        Time: 1131-1150 OT Time Calculation (min): 19 min  Charges: OT General Charges $OT Visit: 1 Visit OT Treatments $Self Care/Home Management : 8-22 mins  [place], [place]   Betsy Pries 10/12/2017, 12:42 PM

## 2017-10-13 ENCOUNTER — Telehealth: Payer: Self-pay | Admitting: Internal Medicine

## 2017-10-13 ENCOUNTER — Telehealth: Payer: Self-pay | Admitting: *Deleted

## 2017-10-13 NOTE — Telephone Encounter (Signed)
Copied from [place] [id number]. Topic: Quick Communication - See Telephone Encounter >> Oct 13, 2017  3:25 PM Mylinda Latina, NT wrote: CRM for notification. See Telephone encounter for: 10/13/17. Beth calling from [facility] is requesting verbal orders for a Home health RN to come an elevate the patient . She would like a RN to  come out this weekend is possible . Please call back with these verbals CB# [phone]

## 2017-10-13 NOTE — Telephone Encounter (Signed)
Pt was on the TCM report admitted 10/09/17, D/C 10/12/17 tried calling pt to make hosp f/u appt no answer LMOM RTC.Marland KitchenJohny Chess

## 2017-10-13 NOTE — Telephone Encounter (Signed)
For what?

## 2017-10-13 NOTE — Telephone Encounter (Signed)
Pt return call back completed TCM call below.Kayla Thompson  Transition Care Management Follow-up Telephone Call   Date discharged? 10/12/17   How have you been since you were released from the hospital? Pt states she is ok still coughing alot   Do you understand why you were in the hospital? YES   Do you understand the discharge instructions? YES   Where were you discharged to? Home   Items Reviewed:  Medications reviewed: YES  Allergies reviewed: YES  Dietary changes reviewed: YES  Referrals reviewed: YES, still waiting on PT to call   Functional Questionnaire:   Activities of Daily Living (ADLs):   She states she are independent in the following: bathing and hygiene, feeding, continence, grooming, toileting and dressing States they require assistance with the following: ambulation   Any transportation issues/concerns?: NO   Any patient concerns? NO   Confirmed importance and date/time of follow-up visits scheduled YES, 10/17/17  Provider Appointment booked with Dr. Sharlet Salina  Confirmed with patient if condition begins to worsen call PCP or go to the ER.  Patient was given the office number and encouraged to call back with question or concerns.  : YES

## 2017-10-13 NOTE — Telephone Encounter (Signed)
Patient was in hospital for pneumonia and was only ordered PT for a fall in the hospital. When beth talked to daughter patient has a bad cough and was put on new meds and a nebulizer. Patients daughter and Eustaquio Maize are wanting to get verbal orders for home health RN to come to the house to evaluate patient.

## 2017-10-14 LAB — CULTURE, BLOOD (ROUTINE X 2)
Culture: NO GROWTH
Culture: NO GROWTH
Special Requests: ADEQUATE
Special Requests: ADEQUATE

## 2017-10-14 NOTE — Telephone Encounter (Signed)
Fine for verbals °

## 2017-10-14 NOTE — Telephone Encounter (Signed)
Verbals given  

## 2017-10-15 DIAGNOSIS — Z9981 Dependence on supplemental oxygen: Secondary | ICD-10-CM | POA: Diagnosis not present

## 2017-10-15 DIAGNOSIS — M1990 Unspecified osteoarthritis, unspecified site: Secondary | ICD-10-CM | POA: Diagnosis not present

## 2017-10-15 DIAGNOSIS — R413 Other amnesia: Secondary | ICD-10-CM | POA: Diagnosis not present

## 2017-10-15 DIAGNOSIS — Z794 Long term (current) use of insulin: Secondary | ICD-10-CM | POA: Diagnosis not present

## 2017-10-15 DIAGNOSIS — M810 Age-related osteoporosis without current pathological fracture: Secondary | ICD-10-CM | POA: Diagnosis not present

## 2017-10-15 DIAGNOSIS — Z7951 Long term (current) use of inhaled steroids: Secondary | ICD-10-CM | POA: Diagnosis not present

## 2017-10-15 DIAGNOSIS — M25562 Pain in left knee: Secondary | ICD-10-CM | POA: Diagnosis not present

## 2017-10-15 DIAGNOSIS — I5032 Chronic diastolic (congestive) heart failure: Secondary | ICD-10-CM | POA: Diagnosis not present

## 2017-10-15 DIAGNOSIS — J9601 Acute respiratory failure with hypoxia: Secondary | ICD-10-CM | POA: Diagnosis not present

## 2017-10-15 DIAGNOSIS — W19XXXD Unspecified fall, subsequent encounter: Secondary | ICD-10-CM | POA: Diagnosis not present

## 2017-10-15 DIAGNOSIS — M353 Polymyalgia rheumatica: Secondary | ICD-10-CM | POA: Diagnosis not present

## 2017-10-15 DIAGNOSIS — I110 Hypertensive heart disease with heart failure: Secondary | ICD-10-CM | POA: Diagnosis not present

## 2017-10-15 DIAGNOSIS — Z8543 Personal history of malignant neoplasm of ovary: Secondary | ICD-10-CM | POA: Diagnosis not present

## 2017-10-15 DIAGNOSIS — J45909 Unspecified asthma, uncomplicated: Secondary | ICD-10-CM | POA: Diagnosis not present

## 2017-10-15 DIAGNOSIS — E039 Hypothyroidism, unspecified: Secondary | ICD-10-CM | POA: Diagnosis not present

## 2017-10-15 DIAGNOSIS — M797 Fibromyalgia: Secondary | ICD-10-CM | POA: Diagnosis not present

## 2017-10-15 DIAGNOSIS — Z8701 Personal history of pneumonia (recurrent): Secondary | ICD-10-CM | POA: Diagnosis not present

## 2017-10-15 DIAGNOSIS — F411 Generalized anxiety disorder: Secondary | ICD-10-CM | POA: Diagnosis not present

## 2017-10-15 DIAGNOSIS — Z9181 History of falling: Secondary | ICD-10-CM | POA: Diagnosis not present

## 2017-10-15 DIAGNOSIS — E119 Type 2 diabetes mellitus without complications: Secondary | ICD-10-CM | POA: Diagnosis not present

## 2017-10-17 ENCOUNTER — Ambulatory Visit (INDEPENDENT_AMBULATORY_CARE_PROVIDER_SITE_OTHER): Payer: Medicare Other | Admitting: Internal Medicine

## 2017-10-17 ENCOUNTER — Encounter: Payer: Self-pay | Admitting: Internal Medicine

## 2017-10-17 ENCOUNTER — Other Ambulatory Visit (INDEPENDENT_AMBULATORY_CARE_PROVIDER_SITE_OTHER): Payer: Medicare Other

## 2017-10-17 ENCOUNTER — Telehealth: Payer: Self-pay | Admitting: Internal Medicine

## 2017-10-17 VITALS — BP 122/80 | HR 67 | Temp 98.1°F | Ht 60.0 in | Wt 139.0 lb

## 2017-10-17 DIAGNOSIS — J181 Lobar pneumonia, unspecified organism: Secondary | ICD-10-CM

## 2017-10-17 DIAGNOSIS — M818 Other osteoporosis without current pathological fracture: Secondary | ICD-10-CM | POA: Diagnosis not present

## 2017-10-17 DIAGNOSIS — J189 Pneumonia, unspecified organism: Secondary | ICD-10-CM

## 2017-10-17 DIAGNOSIS — D72829 Elevated white blood cell count, unspecified: Secondary | ICD-10-CM | POA: Insufficient documentation

## 2017-10-17 DIAGNOSIS — I10 Essential (primary) hypertension: Secondary | ICD-10-CM | POA: Diagnosis not present

## 2017-10-17 DIAGNOSIS — D72828 Other elevated white blood cell count: Secondary | ICD-10-CM

## 2017-10-17 LAB — COMPREHENSIVE METABOLIC PANEL
ALT: 25 U/L (ref 0–35)
AST: 16 U/L (ref 0–37)
Albumin: 3.8 g/dL (ref 3.5–5.2)
Alkaline Phosphatase: 48 U/L (ref 39–117)
BUN: 21 mg/dL (ref 6–23)
CO2: 29 mEq/L (ref 19–32)
Calcium: 9.8 mg/dL (ref 8.4–10.5)
Chloride: 100 mEq/L (ref 96–112)
Creatinine, Ser: 1.04 mg/dL (ref 0.40–1.20)
GFR: 54.15 mL/min — ABNORMAL LOW (ref >60.00)
Glucose, Bld: 55 mg/dL — ABNORMAL LOW (ref 70–99)
Potassium: 4.2 mEq/L (ref 3.5–5.1)
Sodium: 139 mEq/L (ref 135–145)
Total Bilirubin: 0.4 mg/dL (ref 0.2–1.2)
Total Protein: 7 g/dL (ref 6.0–8.3)

## 2017-10-17 MED ORDER — DONEPEZIL HCL 5 MG PO TABS: ORAL_TABLET | ORAL | 3 refills | Status: DC

## 2017-10-17 NOTE — Assessment & Plan Note (Signed)
Likely due to infection CAP. Will check CBC today as she is almost done with antibiotics. She is on chronic prednisone therapy.

## 2017-10-17 NOTE — Telephone Encounter (Signed)
Notified amy w/MD response.Marland KitchenJohny Chess

## 2017-10-17 NOTE — Assessment & Plan Note (Signed)
BP at goal on coreg.

## 2017-10-17 NOTE — Patient Instructions (Signed)
We are checking the labs today.   Come back the week of August 27th for the chest x-ray. You can come anything and go to the basement. We will call you back about the results.    Fall Prevention in the Home Falls can cause injuries. They can happen to people of all ages. There are many things you can do to make your home safe and to help prevent falls. What can I do on the outside of my home?  Regularly fix the edges of walkways and driveways and fix any cracks.  Remove anything that might make you trip as you walk through a door, such as a raised step or threshold.  Trim any bushes or trees on the path to your home.  Use bright outdoor lighting.  Clear any walking paths of anything that might make someone trip, such as rocks or tools.  Regularly check to see if handrails are loose or broken. Make sure that both sides of any steps have handrails.  Any raised decks and porches should have guardrails on the edges.  Have any leaves, snow, or ice cleared regularly.  Use sand or salt on walking paths during winter.  Clean up any spills in your garage right away. This includes oil or grease spills. What can I do in the bathroom?  Use night lights.  Install grab bars by the toilet and in the tub and shower. Do not use towel bars as grab bars.  Use non-skid mats or decals in the tub or shower.  If you need to sit down in the shower, use a plastic, non-slip stool.  Keep the floor dry. Clean up any water that spills on the floor as soon as it happens.  Remove soap buildup in the tub or shower regularly.  Attach bath mats securely with double-sided non-slip rug tape.  Do not have throw rugs and other things on the floor that can make you trip. What can I do in the bedroom?  Use night lights.  Make sure that you have a light by your bed that is easy to reach.  Do not use any sheets or blankets that are too big for your bed. They should not hang down onto the floor.  Have a  firm chair that has side arms. You can use this for support while you get dressed.  Do not have throw rugs and other things on the floor that can make you trip. What can I do in the kitchen?  Clean up any spills right away.  Avoid walking on wet floors.  Keep items that you use a lot in easy-to-reach places.  If you need to reach something above you, use a strong step stool that has a grab bar.  Keep electrical cords out of the way.  Do not use floor polish or wax that makes floors slippery. If you must use wax, use non-skid floor wax.  Do not have throw rugs and other things on the floor that can make you trip. What can I do with my stairs?  Do not leave any items on the stairs.  Make sure that there are handrails on both sides of the stairs and use them. Fix handrails that are broken or loose. Make sure that handrails are as long as the stairways.  Check any carpeting to make sure that it is firmly attached to the stairs. Fix any carpet that is loose or worn.  Avoid having throw rugs at the top or bottom of the  stairs. If you do have throw rugs, attach them to the floor with carpet tape.  Make sure that you have a light switch at the top of the stairs and the bottom of the stairs. If you do not have them, ask someone to add them for you. What else can I do to help prevent falls?  Wear shoes that: ? Do not have high heels. ? Have rubber bottoms. ? Are comfortable and fit you well. ? Are closed at the toe. Do not wear sandals.  If you use a stepladder: ? Make sure that it is fully opened. Do not climb a closed stepladder. ? Make sure that both sides of the stepladder are locked into place. ? Ask someone to hold it for you, if possible.  Clearly mark and make sure that you can see: ? Any grab bars or handrails. ? First and last steps. ? Where the edge of each step is.  Use tools that help you move around (mobility aids) if they are needed. These  include: ? Canes. ? Walkers. ? Scooters. ? Crutches.  Turn on the lights when you go into a dark area. Replace any light bulbs as soon as they burn out.  Set up your furniture so you have a clear path. Avoid moving your furniture around.  If any of your floors are uneven, fix them.  If there are any pets around you, be aware of where they are.  Review your medicines with your doctor. Some medicines can make you feel dizzy. This can increase your chance of falling. Ask your doctor what other things that you can do to help prevent falls. This information is not intended to replace advice given to you by your health care provider. Make sure you discuss any questions you have with your health care provider. Document Released: 01/02/2009 Document Revised: 08/14/2015 Document Reviewed: 04/12/2014 Elsevier Interactive Patient Education  [employer].

## 2017-10-17 NOTE — Progress Notes (Signed)
   Subjective:    Patient ID: Kayla Thompson, female    DOB: April 21, 1935, 82 y.o.   MRN: [id number]  HPI The patient is an 82 YO female coming in for follow up of hospital (in for pneumonia, antibiotics, released to home). She is getting home health at home for nursing and pt. She feels safe at home. Still some coughing and SOB. She is finishing her antibiotic today. She is having mild diarrhea and has taken imodium for this. She denies watery stools or blood in stool. Denies abdominal pain or constipation. Good appetite and drinking fluids normally. She is working with PT at home starting this week. Able to get around the home but still weak and not doing as much. Denies fevers or chills. Denies chest pains. She does have 24 hour care in the home currently. Had a fall in the hospital and is recovering. She had imaging. Some bruising on the left knee which is mildly swollen. She needs knee replacement in that knee so it chronically hurts.   Review of Systems  Constitutional: Positive for activity change. Negative for appetite change, chills, fatigue, fever and unexpected weight change.  HENT: Negative.   Eyes: Negative.   Respiratory: Positive for cough and shortness of breath. Negative for chest tightness.   Cardiovascular: Negative for chest pain, palpitations and leg swelling.  Gastrointestinal: Positive for diarrhea. Negative for abdominal distention, abdominal pain, constipation, nausea and vomiting.  Musculoskeletal: Positive for arthralgias and myalgias.  Skin: Negative.   Neurological: Negative.   Psychiatric/Behavioral: Negative.       Objective:   Physical Exam  Constitutional: She is oriented to person, place, and time. She appears well-developed and well-nourished.  HENT:  Head: Normocephalic and atraumatic.  Eyes: EOM are normal.  Neck: Normal range of motion.  Cardiovascular: Normal rate and regular rhythm.  Pulmonary/Chest: Effort normal. No respiratory distress. She has  wheezes. She has no rales.  Some wheezing right lung, rhonchi throughout mild which clear with coughing.   Abdominal: Soft. Bowel sounds are normal. She exhibits no distension. There is no tenderness. There is no rebound.  Musculoskeletal: She exhibits no edema.  Neurological: She is alert and oriented to person, place, and time. Coordination abnormal.  Wheeled walker for ambulation  Skin: Skin is warm and dry.   Vitals:   10/17/17 1102  BP: 122/80  Pulse: 67  Temp: 98.1 F (36.7 C)  TempSrc: Oral  SpO2: 97%  Weight: 139 lb (63 kg)  Height: 5' (1.524 m)      Assessment & Plan:

## 2017-10-17 NOTE — Progress Notes (Signed)
_0  ID: Kayla Thompson, female    DOB: February 10, 1936, 82 y.o.   MRN: [id number]  Chief Complaint  Patient presents with  . 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   Recent [place] [facility] Encounters:   ~  May 18, 2017:  33moROV & add-on appt requested for increased cough, brown mucus, chest soreness>  SRhianonreturns for [place] hx cogh, yellow brown mucus, incr SOB, etc;  She fell 6wks ago w/ ER eval after injuring lower left ribs & laceration at left elbow;  CXR/Ribs 04/07/17 showed no acute fx, stable chr changes in both lungs- NAD;  Placed on Doxy & given hydrocodone for prn use...     DDiablosays she knows her meds and is great w/ the dosages; they have upcoming appt at the wound care clinic due to the elbow laceration healing...    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  EXAM showed Afeb, VSS, O2sat=96% on RA;  Wt=131#;  Heent- neg, mallampati3;  Chest- decr BS bilat, not congested, w/o w/r/r heard;  Heart- RR gr1/6SEM w/o r/g detected;  Abd- soft, nontender, +panniculus;  Ext- VI w/o c/c/e...   LABS 05/18/17>  Chems- ok x BS=37 (asked to eat ASAP), Cr=1.01, LFTs- wnl;  CBC- Hg=12.6, WBC=20.1;  Sed=23  Sputum C&S> she was unable to collect a specimen while here in the office... IMP/PLAN>>  SheEmmamaes a COPD exac w/ yellow brown mucus and WBC ct of 20K-- we deiced to treat w/ LEVAQUIN 750m25me daily x 7d, and give her a Depo shot &  Medrol dosepak for the bronchial inflamm;  We are going to send in new rx for DUONEB to [facility] (again) to do this TID regularly followed by the Advair bid & Spiriva Qd;  She will [facility], fluids, and use the IS Tid as part of her breathing treatments  ~  June 14, 2017:  43mo R65mo SheilaLourenens having fallen again, this time w/ a pelvic fx & presents in wheelchair; she has had XRays and CT scan by Ortho- DrMcKinley ([facility]- PCP & sports med) but we do not have notes from them;  She has Hx of COPD/ Bronchiectasis/ Abn CT Chest w/ mucoid impactions, tracheobronchomalacia w/ air trapping, 76mm LL46module, granulomatous lung dis & 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     She notes min cough/ sput/ DOE w/ min activ & getting PT at home; prev treated w/ Levaquin & Medrol dosepak=> she states improved back to baseline... EXAM showed Afeb, VSS, O2sat=95% on RA;  Wt=137#;  Heent- neg, mallampati3;  Chest- decr BS bilat, not congested, w/o w/r/r heard;  Heart- RR gr1/6SEM w/o r/g detected;  Abd- soft, nontender, +panniculus;  Ext- VI w/o c/c/e...Marland KitchenMarland Kitchen  IMP/PLAN>>  Again reminded to be diligent w/ her bronchodilator regimen, Mucinex, fluids, IS/flutter;  We plan recheck in 40mo..   ~  September 19, 2017:  321moOV     CXR 09/19/17 (independently reviewed by me in the PACS system) showed norm heart size, clear lungs- NAD, healing bilat rib fxs...  IMP/PLAN>>  ShShawnieemains stable on her NEBULIZER w/ Duoneb (using it Bid) followed by the Advair500Bid & Spiriva once daily;  She has NOT been using her Oxygen at all "I'm fine" she says & "I haven't needed a breather" (she prev refused to do an ONO & qualified for port O2 w/ desat to 88% while walking);  She is asked to let me know if she feels increasingly  short of breath etc, and asked to maintain a regular exercise program...      Tests:  01/29/2013-spirometry- ratio 58, FEV1 75 concavity and flow volume loops 09/26/2012-pulmonary function test- ratio 58, FEV1 79, no significant bronchodilator response, DLCO 57, concavity and flow volume loops  Imaging:  10/09/2017-chest x-ray-left lower lobe nodular bronchovascular opacity suspicious for pneumonia  Cardiac:  08/02/2014-echocardiogram-LV ejection fraction 55 to 6059%grade 1 diastolic dysfunction  Labs:  10/17/2017-c-Met-glucose 55, GFR 54 10/17/2017-CBC-white blood cell count 17.1   Micro:   Chart Review:      10/18/17 HFU  [age]ear old patient seen for follow-up visit today.  Patient was recently in the hospital and treated for left lower lobe pneumonia.  Patient has completed follow-up with primary care on 10/17/2017.  Primary care going to order chest x-ray to be completed in 3 weeks to ensure the pneumonia is resolving.  Patient reports that she is been feeling well, some occasional wheezing, and fatigue.  Patient reports that her mucus color is improving and starting to become more of a clear yellow thick mucus.  Patient accompanied with daughter.  Patient requesting to get rid of her oxygen as well as potentially stop her home health aide at night.  Of note patient did have a fall in the hospital during her last hospitalization.      Allergies  Allergen Reactions  . Latex Rash  . Penicillins Rash    Has patient had a PCN reaction causing immediate rash, facial/tongue/throat swelling, SOB or lightheadedness with hypotension: No Has patient had a PCN reaction causing severe rash involving mucus membranes or skin necrosis: No Has patient had a PCN reaction that required hospitalization: No Has patient had a PCN reaction occurring within the last 10 years: No If all of the above answers are "NO", then may proceed with Cephalosporin use.     Immunization History    Administered Date(s) Administered  . Influenza, High Dose Seasonal PF 12/01/2016  . Influenza,inj,Quad PF,6+ Mos 01/17/2015, 12/18/2015  . Pneumococcal Polysaccharide-23 12/18/2015  . Tdap 08/02/2014, 04/07/2017    Past Medical History:  Diagnosis Date  . Arthritis   . Asthma   . Diabetes mellitus without complication ([facility]  . Fibromyalgia   . GERD (gastroesophageal reflux disease)   . History of chemotherapy   . History of fractured pelvis   . Hypertension   . Osteoporosis   . Osteoporosis   . Ovarian cancer ([place]    Tobacco History: Social History   Tobacco Use  Smoking Status Never Smoker  Smokeless Tobacco Never Used   Counseling given: Not Answered Continue not smoking   Outpatient Encounter Medications as of 10/18/2017  Medication Sig  . albuterol (PROVENTIL HFA;VENTOLIN HFA) 108 (90 Base) MCG/ACT inhaler Inhale  2 puffs into the lungs every 6 (six) hours as needed for wheezing or shortness of breath.  . Ascorbic Acid (VITAMIN C) 1000 MG tablet Take 1,000 mg by mouth daily.  . BD PEN NEEDLE NANO U/F 32G X 4 MM MISC USE 4 TIMES A DAY  . calcium carbonate (OSCAL) 1500 (600 Ca) MG TABS tablet Take 1,500 mg by mouth 2 (two) times daily with a meal.  . Calcium Carbonate-Vit D-Min (CALTRATE 600+D PLUS MINERALS PO) Take 1 tablet by mouth 2 (two) times daily.  . carvedilol (COREG) 6.25 MG tablet Take 0.5 tablets (3.125 mg total) by mouth 2 (two) times daily with a meal.  . Cholecalciferol (VITAMIN D3) 2000 units TABS Take 2 tablets by mouth daily.   . cycloSPORINE (RESTASIS) 0.05 % ophthalmic emulsion Place 1 drop into both eyes 2 (two) times daily.  Marland Kitchen denosumab (PROLIA) 60 MG/ML SOLN injection Inject 60 mg into the skin every 6 (six) months. Administer in upper arm, thigh, or abdomen  . Dextromethorphan-Guaifenesin (MUCINEX DM MAXIMUM STRENGTH) 60-1200 MG TB12 Take 1 tablet by mouth 2 (two) times daily. Reported on 06/23/2015  . donepezil (ARICEPT) 5 MG tablet TAKE 1 TABLET  (5 MG TOTAL) BY MOUTH AT BEDTIME.  . DULoxetine HCl (CYMBALTA PO) Take 90 mg by mouth daily.  . fluticasone (FLONASE) 50 MCG/ACT nasal spray Place 2 sprays into both nostrils daily.  . Fluticasone-Salmeterol (ADVAIR) 500-50 MCG/DOSE AEPB Inhale 1 puff into the lungs 2 (two) times daily.  Marland Kitchen glucose blood (FREESTYLE TEST STRIPS) test strip Use to test blood sugar 3 times daily. Dx: E11.65  . insulin glargine (LANTUS) 100 UNIT/ML injection Inject 20 Units into the skin every morning.  . insulin lispro (HUMALOG KWIKPEN) 100 UNIT/ML KiwkPen Inject 4-6 units into the skin daily. (Patient taking differently: Inject 4 Units into the skin daily. )  . ipratropium-albuterol (DUONEB) 0.5-2.5 (3) MG/3ML SOLN Take 3 mLs by nebulization 3 (three) times daily.  . Lancets (FREESTYLE) lancets Use to test blood sugar 3 times daily. Dx: E11.65  . LANTUS SOLOSTAR 100 UNIT/ML Solostar Pen INJECT 30 UNITS SUBCUTANEOUSLY EVERY MORNING  . levothyroxine (SYNTHROID, LEVOTHROID) 50 MCG tablet Take 1 tablet (50 mcg total) by mouth daily before breakfast.  . Melatonin 5 MG TABS Take 5 mg by mouth at bedtime.  . montelukast (SINGULAIR) 10 MG tablet TAKE 1 TABLET (10 MG TOTAL) BY MOUTH DAILY.  Marland Kitchen omeprazole (PRILOSEC) 20 MG capsule Take 20 mg by mouth daily.  Vladimir Faster Glycol-Propyl Glycol (SYSTANE FREE OP) Apply 1 drop to eye 3 (three) times daily.  . predniSONE (DELTASONE) 1 MG tablet Take 2 mg by mouth daily.   . ranitidine (ZANTAC) 150 MG tablet Take 150 mg by mouth 2 (two) times daily as needed for heartburn.  . rosuvastatin (CRESTOR) 10 MG tablet TAKE 1 TABLET (10 MG TOTAL) BY MOUTH EVERY EVENING.  . SPIRIVA HANDIHALER 18 MCG inhalation capsule PLACE 1 CAPSULE INTO INHALER AND INHALE AS DIRECTED EVERY DAY  . trimethoprim (TRIMPEX) 100 MG tablet Take 100 mg by mouth daily.  . vitamin B-12 (CYANOCOBALAMIN) 250 MCG tablet Take 250 mcg by mouth daily.  Marland Kitchen zolpidem (AMBIEN) 5 MG tablet Take 5 mg by mouth at bedtime.  .  [EXPIRED] cefdinir (OMNICEF) 300 MG capsule Take 1 capsule (300 mg total) by mouth 2 (two) times daily for 5 days.  . [EXPIRED] ipratropium-albuterol (DUONEB) 0.5-2.5 (3) MG/3ML nebulizer solution 3 mL    No facility-administered encounter medications on file as of 10/18/2017.  Review of Systems  Review of Systems  Constitutional: Negative for chills, fatigue, fever and unexpected weight change.  HENT: Negative for congestion, ear pain, postnasal drip, rhinorrhea, sinus pressure, sinus pain, sneezing and sore throat.   Respiratory: Positive for cough (yellow thick mucous, improving ) and wheezing. Negative for chest tightness and shortness of breath.   Cardiovascular: Negative for chest pain and palpitations.  Gastrointestinal: Negative for blood in stool, diarrhea, nausea and vomiting.  Genitourinary: Negative for dysuria, frequency and urgency.  Musculoskeletal: Negative for arthralgias.  Skin: Negative for color change.  Allergic/Immunologic: Negative for environmental allergies and food allergies.  Neurological: Negative for dizziness, light-headedness and headaches.  Psychiatric/Behavioral: Negative for dysphoric mood. The patient is not nervous/anxious.       Physical Exam  BP 110/70   Pulse 81   Temp 98.1 F (36.7 C) (Oral)   Ht 5' (1.524 m)   Wt 139 lb 9.6 oz (63.3 kg)   SpO2 95%   BMI 27.26 kg/m   Wt Readings from Last 5 Encounters:  10/18/17 139 lb 9.6 oz (63.3 kg)  10/17/17 139 lb (63 kg)  10/09/17 148 lb 9.4 oz (67.4 kg)  09/19/17 138 lb 6.4 oz (62.8 kg)  07/29/17 138 lb (62.6 kg)     Physical Exam  Constitutional: She is oriented to person, place, and time and well-developed, well-nourished, and in no distress. No distress.  HENT:  Head: Normocephalic and atraumatic.  Right Ear: Hearing, tympanic membrane, external ear and ear canal normal.  Left Ear: Hearing, tympanic membrane, external ear and ear canal normal.  Nose: Nose normal. Right sinus  exhibits no maxillary sinus tenderness and no frontal sinus tenderness. Left sinus exhibits no maxillary sinus tenderness and no frontal sinus tenderness.  Mouth/Throat: Uvula is midline and oropharynx is clear and moist. No oropharyngeal exudate.  Eyes: Pupils are equal, round, and reactive to light.  Neck: Normal range of motion. Neck supple. No JVD present.  Cardiovascular: Normal rate, regular rhythm and normal heart sounds.  Pulmonary/Chest: Effort normal. No accessory muscle usage. No respiratory distress. She has no decreased breath sounds. She has wheezes (exp wheezes, throughout - pt reports she did not take meds this am, will do duoneb treatment ). She has no rhonchi.  Musculoskeletal: Normal range of motion. She exhibits no edema.  Lymphadenopathy:    She has no cervical adenopathy.  Neurological: She is alert and oriented to person, place, and time. Gait normal.  Skin: Skin is warm and dry. She is not diaphoretic. No erythema.  Psychiatric: Mood, memory, affect and judgment normal.  Nursing note and vitals reviewed.   DuoNeb Treatment in office:  Resp: Resolved expiratory wheezing, improved resp exam   Lab Results:  CBC    Component Value Date/Time   WBC 17.1 (H) 10/17/2017 1146   RBC 3.42 (L) 10/17/2017 1146   HGB 11.7 (L) 10/17/2017 1146   HCT 35.1 (L) 10/17/2017 1146   PLT 433.0 (H) 10/17/2017 1146   MCV 102.5 (H) 10/17/2017 1146   MCH 33.8 10/12/2017 0408   MCHC 33.3 10/17/2017 1146   RDW 13.3 10/17/2017 1146   LYMPHSABS 3.1 10/09/2017 1318   MONOABS 1.1 (H) 10/09/2017 1318   EOSABS 0.3 10/09/2017 1318   BASOSABS 0.0 10/09/2017 1318    BMET    Component Value Date/Time   NA 139 10/17/2017 1146   K 4.2 10/17/2017 1146   CL 100 10/17/2017 1146   CO2 29 10/17/2017 1146   GLUCOSE 55 Repeated and verified  X2. (L) 10/17/2017 1146   BUN 21 10/17/2017 1146   CREATININE 1.04 10/17/2017 1146   CALCIUM 9.8 10/17/2017 1146   GFRNONAA >60 10/10/2017 0636   GFRAA  >60 10/10/2017 0636    BNP No results found for: BNP  ProBNP No results found for: PROBNP  Imaging: Dg Chest 2 View  Result Date: 10/09/2017 CLINICAL DATA:  Cough EXAM: CHEST - 2 VIEW COMPARISON:  09/19/2017 FINDINGS: Increased left lower lobe nodular bronchovascular opacity suspicious for pneumonia when compared to the prior study. Normal heart size and vascularity. Right lung remains clear. No large effusion or pneumothorax. Rib fracture deformities bilaterally. Degenerative changes of the spine and shoulders, worse on the left. Trachea is midline. Aorta is atherosclerotic. IMPRESSION: Left lower lobe nodular bronchovascular opacities suspicious for pneumonia. Electronically Signed   By: Jerilynn Mages.  Shick M.D.   On: 10/09/2017 14:04   Dg Chest 2 View  Result Date: 09/19/2017 CLINICAL DATA:  History of bronchiectasis, follow-up examination EXAM: CHEST - 2 VIEW COMPARISON:  04/07/2017 FINDINGS: Cardiac shadow is within normal limits. Bilateral healing rib fractures are noted callus formation particularly on left involving the anterior aspects of the third through the sixth ribs. No pneumothorax is noted. No other focal abnormality is seen. IMPRESSION: Healing rib fractures bilaterally particularly on the left. No acute abnormality is noted. Electronically Signed   By: Inez Catalina M.D.   On: 09/19/2017 15:51   Dg Wrist Complete Left  Result Date: 10/11/2017 CLINICAL DATA:  [place] yesterday with hip, wrist, and knee pain EXAM: LEFT WRIST - COMPLETE 3+ VIEW COMPARISON:  None. FINDINGS: There is deformity of the distal left radius which appears old consistent with old impaction fracture with healing. No acute cortical disruption is seen. There are significant degenerative changes along the radial aspect of the carpal bones particularly involving the articulation of the navicula with the trapezium where there is compression of the trapezium and degenerative change. No acute fracture is seen. IMPRESSION: 1.  Probable old healed fracture of the distal left radius. 2. Significant degenerative change in the radial aspect of the wrist. Electronically Signed   By: Ivar Drape M.D.   On: 10/11/2017 08:40   Dg Wrist Complete Right  Result Date: 10/11/2017 CLINICAL DATA:  [place] yesterday with wrist pain bilaterally EXAM: RIGHT WRIST - COMPLETE 3+ VIEW COMPARISON:  None. FINDINGS: There is and old healed fracture of the distal right radius present. Significant degenerative changes present along the radial aspect of the wrist at the articulation of the navicula with the trapezium and the trapezium with the first metacarpal with loss of joint space and sclerosis. No acute fracture is seen. Chondrocalcinosis of the triangular fibrocartilage is present which may indicate CPPD arthropathy. IMPRESSION: 1. No acute fracture. 2. Old deformity of the distal right radius from prior fracture. 3. Degenerative change along the radial aspect of the wrist. 4. Chondrocalcinosis consistent with CPPD arthropathy. Electronically Signed   By: Ivar Drape M.D.   On: 10/11/2017 08:42   Ct Cervical Spine Wo Contrast  Result Date: 10/11/2017 CLINICAL DATA:  Fall, neck pain EXAM: CT CERVICAL SPINE WITHOUT CONTRAST TECHNIQUE: Multidetector CT imaging of the cervical spine was performed without intravenous contrast. Multiplanar CT image reconstructions were also generated. COMPARISON:  08/02/2014 FINDINGS: Alignment: Slight anterolisthesis of C2 on C3 and C7 on T1 related to facet disease. Skull base and vertebrae: No acute fracture. No primary bone lesion or focal pathologic process. Soft tissues and spinal canal: No prevertebral fluid or swelling. No  visible canal hematoma. Disc levels: Severe diffuse degenerative disc disease and facet disease bilaterally. Upper chest: No acute findings Other: No acute findings IMPRESSION: Advanced diffuse degenerative disc and facet disease. Associated slight grade 1 anterolisthesis of C2 on C3 and C7 on T1. No  acute bony abnormality. Electronically Signed   By: Rolm Baptise M.D.   On: 10/11/2017 08:54   Dg Hips Bilat With Pelvis 3-4 Views  Result Date: 10/11/2017 CLINICAL DATA:  Fall yesterday.  Bilateral hip pain. EXAM: DG HIP (WITH OR WITHOUT PELVIS) 3-4V BILAT COMPARISON:  CT pelvis 05/19/2017 FINDINGS: Hardware noted in the right femur related to old fracture. Old bilateral inferior pubic rami and right superior pubic ramus fractures noted. No acute fracture. No subluxation or dislocation. Mild degenerative changes in the hips bilaterally. IMPRESSION: Evidence of multiple remote fractures as above. No acute bony abnormality. Electronically Signed   By: Rolm Baptise M.D.   On: 10/11/2017 08:36   Dg Knee 3 Views Left  Result Date: 10/11/2017 CLINICAL DATA:  [place] yesterday with hip, wrist, and knee pain EXAM: LEFT KNEE - 3 VIEW COMPARISON:  MR left knee of 09/12/2016 FINDINGS: There is tricompartmental degenerative joint disease of the left knee primarily involving the medial compartment, where there is more loss of joint space, sclerosis, and spurring present. There is spurring from both the patellofemoral and lateral compartments as well with better preservation of joint spaces. Linear calcification in the knee is consistent with chondrocalcinosis indicative of CPPD arthropathy. No joint effusion is seen and no fracture is noted. IMPRESSION: 1. Tricompartmental degenerative joint disease of the left knee primarily involving the medial compartment. 2. No fracture or effusion. 3. Calcification consistent with CPPD arthropathy. Electronically Signed   By: Ivar Drape M.D.   On: 10/11/2017 08:39   Dg Knee 3 View Right  Result Date: 10/11/2017 CLINICAL DATA:  [place] yesterday with hip, knee, and wrist pain EXAM: RIGHT KNEE - 3 VIEW COMPARISON:  None. FINDINGS: The femoral and tibial components of the right total knee replacement appear to be in good position. No fracture is seen. No joint effusion is noted.  IMPRESSION: Stable appearance of right total knee replacement components. No acute abnormality. Electronically Signed   By: Ivar Drape M.D.   On: 10/11/2017 08:37     Assessment & Plan:   Pleasant 82 year old patient seen for hospital follow-up today.  Spent majority of this office visit discussing with patient importance of her taking her medications as prescribed.  Unfortunately patient did not take her Advair or Spiriva, or any nebulized treatments prior to office visit today.  Discussed with patient is important that she takes these as prescribed.  Patient reports that she understands.  Patient daughter frustrated that patient continues to struggle with taking her medications.  I discussed with daughter as well as the patient that they can do a chart that with a home health aides can ensure that these medications are taken as prescribed.  Patient reports that she understands that she needs to take these inhalers.  Patient to keep follow-up with primary care as well as to complete chest x-ray in 3 weeks to ensure that this pneumonia is resolving.  Follow-up in our office in 3 months, keep follow-up in December/2019 with Dr. Lenna Gilford.  Community acquired pneumonia of left lower lobe of lung ([place]) Obtain SPO2 monitor from pharmacy to check oxygen saturation >>> Ensure oxygen levels are remaining above 90%  Keep oxygen at home as well as portable oxygen concentrator at  home.   >>>Use 2 L as needed with exertion if oxygen levels are dropping below 90.  Follow-up with our office in 3 months  Complete chest x-ray from primary care in 3 weeks to ensure your pneumonia is resolving  If fever occurs or shortness of breath occurs, having difficulty breathing, or concerns regarding your respiratory system please follow-up with our office.   Asthma, chronic Breathing treatment in office today  Please take your medications as prescribed  Advair should be taking twice a day Spiriva should be  taken daily no matter what DuoNeb's 3 times a day scheduled  Obtain SPO2 monitor from pharmacy to check oxygen saturation >>> Ensure oxygen levels are remaining above 90%  Keep oxygen at home as well as portable oxygen concentrator at home.   >>>Use 2 L as needed with exertion if oxygen levels are dropping below 90.  Follow-up with our office in 3 months  Work with nursing aides at home to ensure that these medications are being taken correctly      Lauraine Rinne, NP 10/18/2017

## 2017-10-17 NOTE — Telephone Encounter (Signed)
Copied from [place] [id number]. Topic: Quick Communication - See Telephone Encounter >> Oct 17, 2017  4:11 PM Percell Belt A wrote: CRM for notification. See Telephone encounter for: 10/17/17.  Angie from [facility] -[phone]  Need verbals for nursing  1 week 1 2 week 2

## 2017-10-17 NOTE — Telephone Encounter (Signed)
Fine

## 2017-10-17 NOTE — Assessment & Plan Note (Signed)
Continue prolia and recent illness is not going to change her getting this. Already scheduled for August 6th. Okay to get then.

## 2017-10-17 NOTE — Assessment & Plan Note (Signed)
Ordered CXR for 3-4 weeks from now for clearance. Activity level is increasing. Taking advair and spiriva as well as nebulizer when needed. They have home oxygen which she is not using (has had for 2 years apparently from pulmonary). Declines 6 minute walk test today to see if she needs oxygen at this time. Resting O2 levels are normal today.

## 2017-10-17 NOTE — Telephone Encounter (Signed)
Copied from [place] [id number]. Topic: General - Other >> Oct 17, 2017  9:43 AM Valla Leaver wrote: Reason for CRM: Amy, PT, with [facility] completed therapy eval 07/27 and needs verbal orders with frequency 2x for 4wks and 1x a week for 1wks treatment.

## 2017-10-18 ENCOUNTER — Ambulatory Visit (INDEPENDENT_AMBULATORY_CARE_PROVIDER_SITE_OTHER): Payer: Medicare Other | Admitting: Pulmonary Disease

## 2017-10-18 ENCOUNTER — Encounter: Payer: Self-pay | Admitting: Pulmonary Disease

## 2017-10-18 ENCOUNTER — Other Ambulatory Visit: Payer: Self-pay | Admitting: Internal Medicine

## 2017-10-18 VITALS — BP 110/70 | HR 81 | Temp 98.1°F | Ht 60.0 in | Wt 139.6 lb

## 2017-10-18 DIAGNOSIS — J4530 Mild persistent asthma, uncomplicated: Secondary | ICD-10-CM | POA: Diagnosis not present

## 2017-10-18 DIAGNOSIS — J181 Lobar pneumonia, unspecified organism: Secondary | ICD-10-CM

## 2017-10-18 DIAGNOSIS — D72828 Other elevated white blood cell count: Secondary | ICD-10-CM

## 2017-10-18 DIAGNOSIS — J189 Pneumonia, unspecified organism: Secondary | ICD-10-CM

## 2017-10-18 LAB — CBC
HCT: 35.1 % — ABNORMAL LOW (ref 36.0–46.0)
Hemoglobin: 11.7 g/dL — ABNORMAL LOW (ref 12.0–15.0)
MCHC: 33.3 g/dL (ref 30.0–36.0)
MCV: 102.5 fl — ABNORMAL HIGH (ref 78.0–100.0)
Platelets: 433 10*3/uL — ABNORMAL HIGH (ref 150.0–400.0)
RBC: 3.42 Mil/uL — ABNORMAL LOW (ref 3.87–5.11)
RDW: 13.3 % (ref 11.5–15.5)
WBC: 17.1 10*3/uL — ABNORMAL HIGH (ref 4.0–10.5)

## 2017-10-18 MED ORDER — IPRATROPIUM-ALBUTEROL 0.5-2.5 (3) MG/3ML IN SOLN
3.0000 mL | RESPIRATORY_TRACT | Status: AC
  Administered 2017-10-18: 3 mL via RESPIRATORY_TRACT

## 2017-10-18 NOTE — Assessment & Plan Note (Signed)
Obtain SPO2 monitor from pharmacy to check oxygen saturation >>> Ensure oxygen levels are remaining above 90%  Keep oxygen at home as well as portable oxygen concentrator at home.   >>>Use 2 L as needed with exertion if oxygen levels are dropping below 90.  Follow-up with our office in 3 months  Complete chest x-ray from primary care in 3 weeks to ensure your pneumonia is resolving  If fever occurs or shortness of breath occurs, having difficulty breathing, or concerns regarding your respiratory system please follow-up with our office.

## 2017-10-18 NOTE — Telephone Encounter (Signed)
Fine

## 2017-10-18 NOTE — Assessment & Plan Note (Signed)
Breathing treatment in office today  Please take your medications as prescribed  Advair should be taking twice a day Spiriva should be taken daily no matter what DuoNeb's 3 times a day scheduled  Obtain SPO2 monitor from pharmacy to check oxygen saturation >>> Ensure oxygen levels are remaining above 90%  Keep oxygen at home as well as portable oxygen concentrator at home.   >>>Use 2 L as needed with exertion if oxygen levels are dropping below 90.  Follow-up with our office in 3 months  Work with nursing aides at home to ensure that these medications are being taken correctly

## 2017-10-18 NOTE — Telephone Encounter (Signed)
Called Kayla Thompson no answer LMOM w/MD response.Marland KitchenJohny Chess

## 2017-10-18 NOTE — Patient Instructions (Signed)
Breathing treatment in office today  Please take your medications as prescribed  Advair should be taking twice a day Spiriva should be taken daily no matter what DuoNeb's 3 times a day scheduled  Obtain SPO2 monitor from pharmacy to check oxygen saturation >>> Ensure oxygen levels are remaining above 90%  Keep oxygen at home as well as portable oxygen concentrator at home.   >>>Use 2 L as needed with exertion if oxygen levels are dropping below 90.  Follow-up with our office in 3 months  Work with nursing aides at home to ensure that these medications are being taken correctly  Complete chest x-ray from primary care in 3 weeks to ensure your pneumonia is resolving  If fever occurs or shortness of breath occurs, having difficulty breathing, or concerns regarding your respiratory system please follow-up with our office.    Please contact the office if your symptoms worsen or you have concerns that you are not improving.   Thank you for choosing [facility] for your healthcare, and for allowing [place] to partner with you on your healthcare journey. I am thankful to be able to provide care to you today.   Wyn Quaker FNP-C

## 2017-10-19 ENCOUNTER — Ambulatory Visit (INDEPENDENT_AMBULATORY_CARE_PROVIDER_SITE_OTHER): Payer: Medicare Other | Admitting: Gynecology

## 2017-10-19 ENCOUNTER — Other Ambulatory Visit: Payer: Self-pay | Admitting: Internal Medicine

## 2017-10-19 ENCOUNTER — Encounter: Payer: Self-pay | Admitting: Gynecology

## 2017-10-19 ENCOUNTER — Ambulatory Visit: Payer: Medicare Other | Admitting: Neurology

## 2017-10-19 VITALS — BP 120/78 | Ht 60.0 in | Wt 141.0 lb

## 2017-10-19 DIAGNOSIS — Z9189 Other specified personal risk factors, not elsewhere classified: Secondary | ICD-10-CM | POA: Diagnosis not present

## 2017-10-19 DIAGNOSIS — N952 Postmenopausal atrophic vaginitis: Secondary | ICD-10-CM | POA: Diagnosis not present

## 2017-10-19 DIAGNOSIS — Z01419 Encounter for gynecological examination (general) (routine) without abnormal findings: Secondary | ICD-10-CM | POA: Diagnosis not present

## 2017-10-19 DIAGNOSIS — R21 Rash and other nonspecific skin eruption: Secondary | ICD-10-CM | POA: Diagnosis not present

## 2017-10-19 DIAGNOSIS — E440 Moderate protein-calorie malnutrition: Secondary | ICD-10-CM

## 2017-10-19 DIAGNOSIS — D72828 Other elevated white blood cell count: Secondary | ICD-10-CM

## 2017-10-19 DIAGNOSIS — M810 Age-related osteoporosis without current pathological fracture: Secondary | ICD-10-CM

## 2017-10-19 DIAGNOSIS — Z8543 Personal history of malignant neoplasm of ovary: Secondary | ICD-10-CM

## 2017-10-19 MED ORDER — NYSTATIN 100000 UNIT/GM EX POWD: Freq: Four times a day (QID) | CUTANEOUS | 1 refills | Status: DC

## 2017-10-19 MED ORDER — NYSTATIN-TRIAMCINOLONE 100000-0.1 UNIT/GM-% EX OINT: 1.0000 "application " | TOPICAL_OINTMENT | Freq: Two times a day (BID) | CUTANEOUS | 0 refills | Status: DC

## 2017-10-19 NOTE — Progress Notes (Signed)
    Kayla Thompson 1935/10/19 [id number]        82 y.o.  G1P1 for breast and pelvic exam.  Former patient of Dr. Toney Rakes.  Several issues noted below.  Past medical history,surgical history, problem list, medications, allergies, family history and social history were all reviewed and documented as reviewed in the EPIC chart.  ROS:  Performed with pertinent positives and negatives included in the history, assessment and plan.   Additional significant findings : None   Exam: Wandra Scot assistant Vitals:   10/19/17 1409  BP: 120/78  Weight: 141 lb (64 kg)  Height: 5' (1.524 m)   Body mass index is 27.54 kg/m.  General appearance:  Normal affect, orientation and appearance. Skin: Grossly normal HEENT: Without gross lesions.  No cervical or supraclavicular adenopathy. Thyroid normal.  Lungs:  Clear without wheezing, rales or rhonchi Cardiac: RR, without RMG Abdominal:  Soft, nontender, without masses, guarding, rebound, organomegaly or hernia Breasts:  Examined lying and sitting without masses, retractions, discharge or axillary adenopathy. Pelvic:  Ext, BUS, Vagina: With atrophic changes.  Slight bilateral groin rash consistent with fungal.  Adnexa: Without masses or tenderness    Anus and perineum: Normal   Rectovaginal: Normal sphincter tone without palpated masses or tenderness.    Assessment/Plan:  82 y.o. G1P1 female for breast and pelvic exam..   1. Postmenopausal.  Without significant menopausal symptoms. 2. Vulvar rash consistent with fungal.  Will treat with nystatin powder and Mycolog cream.  Prescriptions for both provided.  We will follow-up if continues. 3. History of ovarian cancer, stage I 1996.  Exam NED.  Last CA 125 was 2016 and negative.  Options for continued CA 125 monitoring versus discontinuing at this time discussed.  The issues of false positive and the work-up involved reviewed.  At this point the patient does not want ongoing screening stating she would  do nothing further regardless at this point and declines CA 125 screening. 4. History of osteoporosis.  DEXA 2016 T score -2.7.  On Prolia through her primary physician's office.  She will continue to follow-up with him in reference to this and continuing the Prolia.  I did recommend she schedule a bone density now at 3 years when she schedules her mammogram and she agrees to do so. 5. Mammography 06/2016.  Patient is going to call to schedule a mammogram now.  Breast exam normal today. 6. Colonoscopy 2015.  Recommendations for colon screening through primary physician's office. 7. Pap smear/HPV 2016.  No Pap smear done today.  Discussed current screening guidelines and patient is comfortable with stop screening. 8. Health maintenance.  No routine lab work done as patient does this elsewhere.  Follow-up 1 year, sooner as needed.   Anastasio Auerbach MD, 2:34 PM 10/19/2017

## 2017-10-19 NOTE — Patient Instructions (Signed)
Schedule your mammogram and bone density at the breast center.

## 2017-10-21 DIAGNOSIS — J9601 Acute respiratory failure with hypoxia: Secondary | ICD-10-CM | POA: Diagnosis not present

## 2017-10-21 DIAGNOSIS — E119 Type 2 diabetes mellitus without complications: Secondary | ICD-10-CM | POA: Diagnosis not present

## 2017-10-21 DIAGNOSIS — I110 Hypertensive heart disease with heart failure: Secondary | ICD-10-CM | POA: Diagnosis not present

## 2017-10-21 DIAGNOSIS — I5032 Chronic diastolic (congestive) heart failure: Secondary | ICD-10-CM | POA: Diagnosis not present

## 2017-10-21 DIAGNOSIS — J45909 Unspecified asthma, uncomplicated: Secondary | ICD-10-CM | POA: Diagnosis not present

## 2017-10-21 DIAGNOSIS — M1990 Unspecified osteoarthritis, unspecified site: Secondary | ICD-10-CM | POA: Diagnosis not present

## 2017-10-25 ENCOUNTER — Ambulatory Visit: Payer: Medicare Other

## 2017-10-25 DIAGNOSIS — I5032 Chronic diastolic (congestive) heart failure: Secondary | ICD-10-CM | POA: Diagnosis not present

## 2017-10-25 DIAGNOSIS — E119 Type 2 diabetes mellitus without complications: Secondary | ICD-10-CM | POA: Diagnosis not present

## 2017-10-25 DIAGNOSIS — J45909 Unspecified asthma, uncomplicated: Secondary | ICD-10-CM | POA: Diagnosis not present

## 2017-10-25 DIAGNOSIS — I110 Hypertensive heart disease with heart failure: Secondary | ICD-10-CM | POA: Diagnosis not present

## 2017-10-25 DIAGNOSIS — M1990 Unspecified osteoarthritis, unspecified site: Secondary | ICD-10-CM | POA: Diagnosis not present

## 2017-10-25 DIAGNOSIS — J9601 Acute respiratory failure with hypoxia: Secondary | ICD-10-CM | POA: Diagnosis not present

## 2017-10-26 ENCOUNTER — Ambulatory Visit (INDEPENDENT_AMBULATORY_CARE_PROVIDER_SITE_OTHER): Payer: Medicare Other | Admitting: *Deleted

## 2017-10-26 DIAGNOSIS — I110 Hypertensive heart disease with heart failure: Secondary | ICD-10-CM | POA: Diagnosis not present

## 2017-10-26 DIAGNOSIS — M810 Age-related osteoporosis without current pathological fracture: Secondary | ICD-10-CM | POA: Diagnosis not present

## 2017-10-26 DIAGNOSIS — J45909 Unspecified asthma, uncomplicated: Secondary | ICD-10-CM | POA: Diagnosis not present

## 2017-10-26 DIAGNOSIS — I5032 Chronic diastolic (congestive) heart failure: Secondary | ICD-10-CM | POA: Diagnosis not present

## 2017-10-26 DIAGNOSIS — M1990 Unspecified osteoarthritis, unspecified site: Secondary | ICD-10-CM | POA: Diagnosis not present

## 2017-10-26 DIAGNOSIS — J9601 Acute respiratory failure with hypoxia: Secondary | ICD-10-CM | POA: Diagnosis not present

## 2017-10-26 DIAGNOSIS — E119 Type 2 diabetes mellitus without complications: Secondary | ICD-10-CM | POA: Diagnosis not present

## 2017-10-26 MED ORDER — DENOSUMAB 60 MG/ML ~~LOC~~ SOSY
60.0000 mg | PREFILLED_SYRINGE | Freq: Once | SUBCUTANEOUS | Status: AC
  Administered 2017-10-26: 60 mg via SUBCUTANEOUS

## 2017-10-27 NOTE — Progress Notes (Signed)
Medical treatment/procedure(s) were performed by non-physician practitioner and as supervising physician I was immediately available for consultation/collaboration. I agree with above. Clydean Posas A Safira Proffit, MD  

## 2017-10-28 DIAGNOSIS — J9601 Acute respiratory failure with hypoxia: Secondary | ICD-10-CM | POA: Diagnosis not present

## 2017-10-28 DIAGNOSIS — I110 Hypertensive heart disease with heart failure: Secondary | ICD-10-CM | POA: Diagnosis not present

## 2017-10-28 DIAGNOSIS — J45909 Unspecified asthma, uncomplicated: Secondary | ICD-10-CM | POA: Diagnosis not present

## 2017-10-28 DIAGNOSIS — E119 Type 2 diabetes mellitus without complications: Secondary | ICD-10-CM | POA: Diagnosis not present

## 2017-10-28 DIAGNOSIS — I5032 Chronic diastolic (congestive) heart failure: Secondary | ICD-10-CM | POA: Diagnosis not present

## 2017-10-28 DIAGNOSIS — M1990 Unspecified osteoarthritis, unspecified site: Secondary | ICD-10-CM | POA: Diagnosis not present

## 2017-11-02 DIAGNOSIS — I110 Hypertensive heart disease with heart failure: Secondary | ICD-10-CM | POA: Diagnosis not present

## 2017-11-02 DIAGNOSIS — J45909 Unspecified asthma, uncomplicated: Secondary | ICD-10-CM | POA: Diagnosis not present

## 2017-11-02 DIAGNOSIS — I5032 Chronic diastolic (congestive) heart failure: Secondary | ICD-10-CM | POA: Diagnosis not present

## 2017-11-02 DIAGNOSIS — M1990 Unspecified osteoarthritis, unspecified site: Secondary | ICD-10-CM | POA: Diagnosis not present

## 2017-11-02 DIAGNOSIS — J9601 Acute respiratory failure with hypoxia: Secondary | ICD-10-CM | POA: Diagnosis not present

## 2017-11-02 DIAGNOSIS — E119 Type 2 diabetes mellitus without complications: Secondary | ICD-10-CM | POA: Diagnosis not present

## 2017-11-03 ENCOUNTER — Other Ambulatory Visit: Payer: Self-pay | Admitting: *Deleted

## 2017-11-03 MED ORDER — IPRATROPIUM-ALBUTEROL 0.5-2.5 (3) MG/3ML IN SOLN: 3.0000 mL | Freq: Three times a day (TID) | RESPIRATORY_TRACT | 3 refills | Status: DC

## 2017-11-04 DIAGNOSIS — I5032 Chronic diastolic (congestive) heart failure: Secondary | ICD-10-CM | POA: Diagnosis not present

## 2017-11-04 DIAGNOSIS — E119 Type 2 diabetes mellitus without complications: Secondary | ICD-10-CM | POA: Diagnosis not present

## 2017-11-04 DIAGNOSIS — I110 Hypertensive heart disease with heart failure: Secondary | ICD-10-CM | POA: Diagnosis not present

## 2017-11-04 DIAGNOSIS — M1990 Unspecified osteoarthritis, unspecified site: Secondary | ICD-10-CM | POA: Diagnosis not present

## 2017-11-04 DIAGNOSIS — J9601 Acute respiratory failure with hypoxia: Secondary | ICD-10-CM | POA: Diagnosis not present

## 2017-11-04 DIAGNOSIS — J45909 Unspecified asthma, uncomplicated: Secondary | ICD-10-CM | POA: Diagnosis not present

## 2017-11-07 ENCOUNTER — Ambulatory Visit (INDEPENDENT_AMBULATORY_CARE_PROVIDER_SITE_OTHER)
Admission: RE | Admit: 2017-11-07 | Discharge: 2017-11-07 | Disposition: A | Discharge status: Home / Self Care | Payer: Medicare Other | Source: Ambulatory Visit | Attending: Internal Medicine | Admitting: Internal Medicine

## 2017-11-07 DIAGNOSIS — J181 Lobar pneumonia, unspecified organism: Secondary | ICD-10-CM | POA: Diagnosis not present

## 2017-11-07 DIAGNOSIS — J189 Pneumonia, unspecified organism: Secondary | ICD-10-CM

## 2017-11-08 DIAGNOSIS — J45909 Unspecified asthma, uncomplicated: Secondary | ICD-10-CM | POA: Diagnosis not present

## 2017-11-08 DIAGNOSIS — I110 Hypertensive heart disease with heart failure: Secondary | ICD-10-CM | POA: Diagnosis not present

## 2017-11-08 DIAGNOSIS — I5032 Chronic diastolic (congestive) heart failure: Secondary | ICD-10-CM | POA: Diagnosis not present

## 2017-11-08 DIAGNOSIS — M1990 Unspecified osteoarthritis, unspecified site: Secondary | ICD-10-CM | POA: Diagnosis not present

## 2017-11-08 DIAGNOSIS — J9601 Acute respiratory failure with hypoxia: Secondary | ICD-10-CM | POA: Diagnosis not present

## 2017-11-08 DIAGNOSIS — E119 Type 2 diabetes mellitus without complications: Secondary | ICD-10-CM | POA: Diagnosis not present

## 2017-11-09 ENCOUNTER — Other Ambulatory Visit: Payer: Self-pay | Admitting: Pulmonary Disease

## 2017-11-10 ENCOUNTER — Other Ambulatory Visit: Payer: Self-pay | Admitting: Gynecology

## 2017-11-10 ENCOUNTER — Telehealth: Payer: Self-pay | Admitting: *Deleted

## 2017-11-10 DIAGNOSIS — M1990 Unspecified osteoarthritis, unspecified site: Secondary | ICD-10-CM | POA: Diagnosis not present

## 2017-11-10 DIAGNOSIS — M818 Other osteoporosis without current pathological fracture: Secondary | ICD-10-CM

## 2017-11-10 DIAGNOSIS — E119 Type 2 diabetes mellitus without complications: Secondary | ICD-10-CM | POA: Diagnosis not present

## 2017-11-10 DIAGNOSIS — I110 Hypertensive heart disease with heart failure: Secondary | ICD-10-CM | POA: Diagnosis not present

## 2017-11-10 DIAGNOSIS — Z1231 Encounter for screening mammogram for malignant neoplasm of breast: Secondary | ICD-10-CM

## 2017-11-10 DIAGNOSIS — J45909 Unspecified asthma, uncomplicated: Secondary | ICD-10-CM | POA: Diagnosis not present

## 2017-11-10 DIAGNOSIS — J9601 Acute respiratory failure with hypoxia: Secondary | ICD-10-CM | POA: Diagnosis not present

## 2017-11-10 DIAGNOSIS — I5032 Chronic diastolic (congestive) heart failure: Secondary | ICD-10-CM | POA: Diagnosis not present

## 2017-11-10 NOTE — Telephone Encounter (Signed)
Patient called requesting dexa order placed at breast center, per note on 10/19/17

## 2017-11-11 ENCOUNTER — Encounter: Payer: Self-pay | Admitting: Nurse Practitioner

## 2017-11-11 DIAGNOSIS — J45909 Unspecified asthma, uncomplicated: Secondary | ICD-10-CM | POA: Diagnosis not present

## 2017-11-11 DIAGNOSIS — M797 Fibromyalgia: Secondary | ICD-10-CM | POA: Diagnosis not present

## 2017-11-11 DIAGNOSIS — E119 Type 2 diabetes mellitus without complications: Secondary | ICD-10-CM | POA: Diagnosis not present

## 2017-11-11 DIAGNOSIS — Z9181 History of falling: Secondary | ICD-10-CM

## 2017-11-11 DIAGNOSIS — F411 Generalized anxiety disorder: Secondary | ICD-10-CM | POA: Diagnosis not present

## 2017-11-11 DIAGNOSIS — Z8543 Personal history of malignant neoplasm of ovary: Secondary | ICD-10-CM

## 2017-11-11 DIAGNOSIS — E039 Hypothyroidism, unspecified: Secondary | ICD-10-CM | POA: Diagnosis not present

## 2017-11-11 DIAGNOSIS — I110 Hypertensive heart disease with heart failure: Secondary | ICD-10-CM | POA: Diagnosis not present

## 2017-11-11 DIAGNOSIS — M25562 Pain in left knee: Secondary | ICD-10-CM | POA: Diagnosis not present

## 2017-11-11 DIAGNOSIS — W19XXXD Unspecified fall, subsequent encounter: Secondary | ICD-10-CM

## 2017-11-11 DIAGNOSIS — Z794 Long term (current) use of insulin: Secondary | ICD-10-CM

## 2017-11-11 DIAGNOSIS — R413 Other amnesia: Secondary | ICD-10-CM

## 2017-11-11 DIAGNOSIS — Z9981 Dependence on supplemental oxygen: Secondary | ICD-10-CM

## 2017-11-11 DIAGNOSIS — M1990 Unspecified osteoarthritis, unspecified site: Secondary | ICD-10-CM | POA: Diagnosis not present

## 2017-11-11 DIAGNOSIS — J9601 Acute respiratory failure with hypoxia: Secondary | ICD-10-CM | POA: Diagnosis not present

## 2017-11-11 DIAGNOSIS — Z7951 Long term (current) use of inhaled steroids: Secondary | ICD-10-CM

## 2017-11-11 DIAGNOSIS — Z8701 Personal history of pneumonia (recurrent): Secondary | ICD-10-CM

## 2017-11-11 DIAGNOSIS — M810 Age-related osteoporosis without current pathological fracture: Secondary | ICD-10-CM | POA: Diagnosis not present

## 2017-11-11 DIAGNOSIS — I5032 Chronic diastolic (congestive) heart failure: Secondary | ICD-10-CM | POA: Diagnosis not present

## 2017-11-11 DIAGNOSIS — M353 Polymyalgia rheumatica: Secondary | ICD-10-CM | POA: Diagnosis not present

## 2017-11-14 ENCOUNTER — Telehealth: Payer: Self-pay

## 2017-11-14 NOTE — Telephone Encounter (Signed)
Received notification that patient was unaware of why she needed a referral to hematology. I called and LVM on Daughter's [name]) cell and informed her that the referral was placed due to patient last lab results for elevated WBC and wanting to make sure that is was only coming from the chronic prednisone that patient takes.

## 2017-11-15 ENCOUNTER — Telehealth: Payer: Self-pay | Admitting: Internal Medicine

## 2017-11-15 DIAGNOSIS — E119 Type 2 diabetes mellitus without complications: Secondary | ICD-10-CM | POA: Diagnosis not present

## 2017-11-15 DIAGNOSIS — J45909 Unspecified asthma, uncomplicated: Secondary | ICD-10-CM | POA: Diagnosis not present

## 2017-11-15 DIAGNOSIS — I110 Hypertensive heart disease with heart failure: Secondary | ICD-10-CM | POA: Diagnosis not present

## 2017-11-15 DIAGNOSIS — M1990 Unspecified osteoarthritis, unspecified site: Secondary | ICD-10-CM | POA: Diagnosis not present

## 2017-11-15 DIAGNOSIS — I5032 Chronic diastolic (congestive) heart failure: Secondary | ICD-10-CM | POA: Diagnosis not present

## 2017-11-15 DIAGNOSIS — J9601 Acute respiratory failure with hypoxia: Secondary | ICD-10-CM | POA: Diagnosis not present

## 2017-11-15 NOTE — Telephone Encounter (Signed)
Copied from [place]. Topic: Quick Communication - See Telephone Encounter >> Nov 15, 2017  3:43 PM Mylinda Latina, NT wrote: CRM for notification. See Telephone encounter for: 11/15/17.Liji calling from [facility] is requesting verbals order. The orders are PT continuing 1x week for 1 week, 2x a week for 1 week, 1x a week for 2 weeks progress towards goals. Please call to provide verbals CB# [phone]

## 2017-11-16 NOTE — Telephone Encounter (Signed)
Notified Ligi w/ MD response../[email or web address] 

## 2017-11-16 NOTE — Telephone Encounter (Signed)
Fine

## 2017-11-17 DIAGNOSIS — I5032 Chronic diastolic (congestive) heart failure: Secondary | ICD-10-CM | POA: Diagnosis not present

## 2017-11-17 DIAGNOSIS — J9601 Acute respiratory failure with hypoxia: Secondary | ICD-10-CM | POA: Diagnosis not present

## 2017-11-17 DIAGNOSIS — M1990 Unspecified osteoarthritis, unspecified site: Secondary | ICD-10-CM | POA: Diagnosis not present

## 2017-11-17 DIAGNOSIS — J45909 Unspecified asthma, uncomplicated: Secondary | ICD-10-CM | POA: Diagnosis not present

## 2017-11-17 DIAGNOSIS — I110 Hypertensive heart disease with heart failure: Secondary | ICD-10-CM | POA: Diagnosis not present

## 2017-11-17 DIAGNOSIS — E119 Type 2 diabetes mellitus without complications: Secondary | ICD-10-CM | POA: Diagnosis not present

## 2017-11-22 ENCOUNTER — Other Ambulatory Visit: Payer: Self-pay | Admitting: Pulmonary Disease

## 2017-11-22 DIAGNOSIS — E119 Type 2 diabetes mellitus without complications: Secondary | ICD-10-CM | POA: Diagnosis not present

## 2017-11-22 DIAGNOSIS — I5032 Chronic diastolic (congestive) heart failure: Secondary | ICD-10-CM | POA: Diagnosis not present

## 2017-11-22 DIAGNOSIS — J9601 Acute respiratory failure with hypoxia: Secondary | ICD-10-CM | POA: Diagnosis not present

## 2017-11-22 DIAGNOSIS — J45909 Unspecified asthma, uncomplicated: Secondary | ICD-10-CM | POA: Diagnosis not present

## 2017-11-22 DIAGNOSIS — M1990 Unspecified osteoarthritis, unspecified site: Secondary | ICD-10-CM | POA: Diagnosis not present

## 2017-11-22 DIAGNOSIS — I110 Hypertensive heart disease with heart failure: Secondary | ICD-10-CM | POA: Diagnosis not present

## 2017-11-24 DIAGNOSIS — E119 Type 2 diabetes mellitus without complications: Secondary | ICD-10-CM | POA: Diagnosis not present

## 2017-11-24 DIAGNOSIS — I110 Hypertensive heart disease with heart failure: Secondary | ICD-10-CM | POA: Diagnosis not present

## 2017-11-24 DIAGNOSIS — M1990 Unspecified osteoarthritis, unspecified site: Secondary | ICD-10-CM | POA: Diagnosis not present

## 2017-11-24 DIAGNOSIS — J45909 Unspecified asthma, uncomplicated: Secondary | ICD-10-CM | POA: Diagnosis not present

## 2017-11-24 DIAGNOSIS — I5032 Chronic diastolic (congestive) heart failure: Secondary | ICD-10-CM | POA: Diagnosis not present

## 2017-11-24 DIAGNOSIS — J9601 Acute respiratory failure with hypoxia: Secondary | ICD-10-CM | POA: Diagnosis not present

## 2017-11-28 ENCOUNTER — Inpatient Hospital Stay: Payer: Medicare Other | Attending: Nurse Practitioner | Admitting: Nurse Practitioner

## 2017-11-28 ENCOUNTER — Inpatient Hospital Stay: Payer: Medicare Other

## 2017-11-28 ENCOUNTER — Encounter: Payer: Medicare Other | Admitting: Oncology

## 2017-11-28 ENCOUNTER — Encounter: Payer: Self-pay | Admitting: Nurse Practitioner

## 2017-11-28 ENCOUNTER — Telehealth: Payer: Self-pay | Admitting: Nurse Practitioner

## 2017-11-28 VITALS — BP 157/91 | HR 89 | Temp 97.9°F | Resp 17 | Ht 60.0 in | Wt 147.9 lb

## 2017-11-28 DIAGNOSIS — I10 Essential (primary) hypertension: Secondary | ICD-10-CM | POA: Insufficient documentation

## 2017-11-28 DIAGNOSIS — Z9221 Personal history of antineoplastic chemotherapy: Secondary | ICD-10-CM | POA: Insufficient documentation

## 2017-11-28 DIAGNOSIS — D649 Anemia, unspecified: Secondary | ICD-10-CM | POA: Insufficient documentation

## 2017-11-28 DIAGNOSIS — E039 Hypothyroidism, unspecified: Secondary | ICD-10-CM | POA: Diagnosis not present

## 2017-11-28 DIAGNOSIS — E538 Deficiency of other specified B group vitamins: Secondary | ICD-10-CM

## 2017-11-28 DIAGNOSIS — M797 Fibromyalgia: Secondary | ICD-10-CM | POA: Insufficient documentation

## 2017-11-28 DIAGNOSIS — Z794 Long term (current) use of insulin: Secondary | ICD-10-CM

## 2017-11-28 DIAGNOSIS — E785 Hyperlipidemia, unspecified: Secondary | ICD-10-CM | POA: Insufficient documentation

## 2017-11-28 DIAGNOSIS — D72829 Elevated white blood cell count, unspecified: Secondary | ICD-10-CM | POA: Diagnosis not present

## 2017-11-28 DIAGNOSIS — Z86010 Personal history of colonic polyps: Secondary | ICD-10-CM | POA: Diagnosis not present

## 2017-11-28 DIAGNOSIS — Z801 Family history of malignant neoplasm of trachea, bronchus and lung: Secondary | ICD-10-CM | POA: Diagnosis not present

## 2017-11-28 DIAGNOSIS — I5030 Unspecified diastolic (congestive) heart failure: Secondary | ICD-10-CM | POA: Insufficient documentation

## 2017-11-28 DIAGNOSIS — M1990 Unspecified osteoarthritis, unspecified site: Secondary | ICD-10-CM | POA: Diagnosis not present

## 2017-11-28 DIAGNOSIS — E119 Type 2 diabetes mellitus without complications: Secondary | ICD-10-CM | POA: Diagnosis not present

## 2017-11-28 DIAGNOSIS — M810 Age-related osteoporosis without current pathological fracture: Secondary | ICD-10-CM | POA: Diagnosis not present

## 2017-11-28 DIAGNOSIS — Z8543 Personal history of malignant neoplasm of ovary: Secondary | ICD-10-CM

## 2017-11-28 DIAGNOSIS — K219 Gastro-esophageal reflux disease without esophagitis: Secondary | ICD-10-CM | POA: Diagnosis not present

## 2017-11-28 DIAGNOSIS — Z79899 Other long term (current) drug therapy: Secondary | ICD-10-CM | POA: Insufficient documentation

## 2017-11-28 LAB — CBC WITH DIFFERENTIAL (CANCER CENTER ONLY)
Basophils Absolute: 0.1 10*3/uL (ref 0.0–0.1)
Basophils Relative: 1 %
Eosinophils Absolute: 0.3 10*3/uL (ref 0.0–0.5)
Eosinophils Relative: 3 %
HCT: 36.3 % (ref 34.8–46.6)
Hemoglobin: 11.7 g/dL (ref 11.6–15.9)
Lymphocytes Relative: 24 %
Lymphs Abs: 2.8 10*3/uL (ref 0.9–3.3)
MCH: 32.6 pg (ref 25.1–34.0)
MCHC: 32.2 g/dL (ref 31.5–36.0)
MCV: 101.4 fL — ABNORMAL HIGH (ref 79.5–101.0)
Monocytes Absolute: 0.7 10*3/uL (ref 0.1–0.9)
Monocytes Relative: 6 %
Neutro Abs: 7.6 10*3/uL — ABNORMAL HIGH (ref 1.5–6.5)
Neutrophils Relative %: 66 %
Platelet Count: 304 10*3/uL (ref 145–400)
RBC: 3.58 MIL/uL — ABNORMAL LOW (ref 3.70–5.45)
RDW: 13 % (ref 11.2–14.5)
WBC Count: 11.5 10*3/uL — ABNORMAL HIGH (ref 3.9–10.3)

## 2017-11-28 LAB — VITAMIN B12: Vitamin B-12: 3873 pg/mL — ABNORMAL HIGH (ref 180–914)

## 2017-11-28 NOTE — Progress Notes (Addendum)
[place]  Telephone:(336) [phone] Fax:(336) [place] consult Note   Patient Care Team: [name], MD as PCP - General (Internal Medicine) 11/28/2017  CHIEF COMPLAINTS/PURPOSE OF CONSULTATION:  Leukocytosis   HISTORY OF PRESENTING ILLNESS:  Kayla Thompson 82 y.o. female is here because of elevated white count. The referral was requested by Dr. Pricilla Holm.  She was found to have abnormal CBC from 08/02/2014 with WBC 15.9, ANC 8.6, and absolute lymphocytes 6.4 during ER visit for syncope and found to have UTI.  She previously lived in [place], [place] and moved to the area around that time. She has PMH of asthma, bronchiectasis, h/o lung surgery which showed granulomatous lung disease, and PMR on chronic steroids for "20 years". Sed rate 12/1814 was elevated to 52. CBC on 10/21/15 with WBC 18.6 in the setting of acute bronchitis. WBC on 12/01/16 down to 11.5 but elevated again in 05/18/17 to 20.1 in the setting of COPD exacerbation, treated with antibiotics and medrol dose pak. More recently on 10/09/17 she went to ER for cough and fever and found to have evidence of LLL pneumonia and was admitted for inpatient treatment, CBC showed WBC 20.3 and ANC 15.8, normal lymphocytes, and macrocytosis. Most recent CBC on 10/17/17 showed WBC 17.1, Plt 433K. She has had mild intermittent anemia with Hgb 10-11 during times of acute infections as well.   Other PMH includes HTN, HL, hypothyroidism, hyperparathyroidism, osteoporosis on prolia, fibromyalgia, OA, TIIDM, diastolic CHF, GERD, and history of ovarian cancer reportedly stage I in 1996 s/p radical hysterectomy and adjuvant chemotherapy in [place]. She has mammogram coming up next month. Dr. Wayne Both manages GYN f/u and last CA 125 was normal. She reports last colonoscopy was 4 years ago, with history of polyps. Family history is positive for lung cancer in her mother. Denies colon, breast, GYN, or other cancers in her family.  She has 2 healthy children. Denies alcohol, tobacco, or drug use. She has a home health aid with her who spends 12 hours with her each day. She bathes and dresses independently but has help with meal prep, house cleaning, and driving.   Today she feels well, other than pain in her knees, shoulder, and hands. She denies fever, night sweats, or weight loss. She recovered from pnueomonia 1 month ago and denies recent fever or chills. She has chronic cough with white sputum from asthma and COPD.   MEDICAL HISTORY:  Past Medical History:  Diagnosis Date  . Arthritis   . Asthma   . Diabetes mellitus without complication ([place])   . Fibromyalgia   . GERD (gastroesophageal reflux disease)   . History of chemotherapy   . History of fractured pelvis   . Hypertension   . Osteoporosis   . Osteoporosis   . Ovarian cancer [facility])     SURGICAL HISTORY: Past Surgical History:  Procedure Laterality Date  . ABDOMINAL HYSTERECTOMY  1996  . ANKLE FRACTURE SURGERY     plate and screws  . BLADDER SUSPENSION    . CATARACT EXTRACTION    . EYE SURGERY    . hip surgey    . knee surgey    . LUNG SURGERY    . OOPHORECTOMY     BSO    SOCIAL HISTORY: Social History   Socioeconomic History  . Marital status: Widowed    Spouse name: Not on file  . Number of children: 2  . Years of education: Not on file  . Highest education  level: Not on file  Occupational History  . Not on file  Social Needs  . Financial resource strain: Not on file  . Food insecurity:    Worry: Not on file    Inability: Not on file  . Transportation needs:    Medical: Not on file    Non-medical: Not on file  Tobacco Use  . Smoking status: Never Smoker  . Smokeless tobacco: Never Used  Substance and Sexual Activity  . Alcohol use: No    Alcohol/week: 0.0 standard drinks  . Drug use: No  . Sexual activity: Not Currently    Comment: declined insuracne questions  Lifestyle  . Physical activity:    Days per week: Not on  file    Minutes per session: Not on file  . Stress: Not on file  Relationships  . Social connections:    Talks on phone: Not on file    Gets together: Not on file    Attends religious service: Not on file    Active member of club or organization: Not on file    Attends meetings of clubs or organizations: Not on file    Relationship status: Not on file  . Intimate partner violence:    Fear of current or ex partner: Not on file    Emotionally abused: Not on file    Physically abused: Not on file    Forced sexual activity: Not on file  Other Topics Concern  . Not on file  Social History Narrative  . Not on file    FAMILY HISTORY: Family History  Problem Relation Age of Onset  . Lung cancer Mother   . CAD Father   . Diabetes Father   . Lung cancer Maternal Aunt   . Diabetes Paternal Aunt   . Diabetes Paternal Uncle   . Diabetes Paternal Grandmother   . Diabetes Paternal Grandfather     ALLERGIES:  is allergic to latex and penicillins.  MEDICATIONS:  Current Outpatient Medications  Medication Sig Dispense Refill  . albuterol (PROVENTIL HFA;VENTOLIN HFA) 108 (90 Base) MCG/ACT inhaler Inhale 2 puffs into the lungs every 6 (six) hours as needed for wheezing or shortness of breath. 1 Inhaler 11  . BD PEN NEEDLE NANO U/F 32G X 4 MM MISC USE 4 TIMES A DAY 400 each 1  . Calcium Carbonate-Vit D-Min (CALTRATE 600+D PLUS MINERALS PO) Take 1 tablet by mouth 2 (two) times daily.    . carvedilol (COREG) 6.25 MG tablet Take 0.5 tablets (3.125 mg total) by mouth 2 (two) times daily with a meal. 60 tablet 5  . Cholecalciferol (VITAMIN D3) 2000 units TABS Take 2 tablets by mouth daily.     . cycloSPORINE (RESTASIS) 0.05 % ophthalmic emulsion Place 1 drop into both eyes 2 (two) times daily.    Marland Kitchen denosumab (PROLIA) 60 MG/ML SOLN injection Inject 60 mg into the skin every 6 (six) months. Administer in upper arm, thigh, or abdomen    . Dexlansoprazole 30 MG capsule Take 30 mg by mouth daily.      Marland Kitchen Dextromethorphan-Guaifenesin (MUCINEX DM MAXIMUM STRENGTH) 60-1200 MG TB12 Take 1 tablet by mouth 2 (two) times daily. Reported on 06/23/2015    . DULoxetine HCl (CYMBALTA PO) Take 90 mg by mouth daily.    . fluticasone (FLONASE) 50 MCG/ACT nasal spray Place 2 sprays into both nostrils daily.    . Fluticasone-Salmeterol (ADVAIR) 500-50 MCG/DOSE AEPB Inhale 1 puff into the lungs 2 (two) times daily.    Marland Kitchen  glucose blood (FREESTYLE TEST STRIPS) test strip Use to test blood sugar 3 times daily. Dx: E11.65 100 each 3  . insulin glargine (LANTUS) 100 UNIT/ML injection Inject 20 Units into the skin every morning.    . insulin lispro (HUMALOG KWIKPEN) 100 UNIT/ML KiwkPen Inject 4-6 units into the skin daily. (Patient taking differently: Inject 4 Units into the skin daily. ) 5 pen 5  . Lancets (FREESTYLE) lancets Use to test blood sugar 3 times daily. Dx: E11.65 100 each 3  . levothyroxine (SYNTHROID, LEVOTHROID) 50 MCG tablet Take 1 tablet (50 mcg total) by mouth daily before breakfast. 90 tablet 1  . montelukast (SINGULAIR) 10 MG tablet TAKE 1 TABLET (10 MG TOTAL) BY MOUTH DAILY. 90 tablet 1  . Multiple Vitamins-Minerals (CENTRUM ADULTS PO) Take by mouth.    . Omega-3 Fatty Acids (FISH OIL) 1000 MG CAPS Take by mouth.    . predniSONE (DELTASONE) 1 MG tablet Take 2 mg by mouth daily.     . ranitidine (ZANTAC) 150 MG tablet Take 150 mg by mouth 2 (two) times daily as needed for heartburn.    . rosuvastatin (CRESTOR) 10 MG tablet TAKE 1 TABLET (10 MG TOTAL) BY MOUTH EVERY EVENING. 90 tablet 2  . SPIRIVA HANDIHALER 18 MCG inhalation capsule PLACE 1 CAPSULE INTO INHALER AND INHALE AS DIRECTED EVERY DAY 30 capsule 4  . vitamin B-12 (CYANOCOBALAMIN) 250 MCG tablet Take 250 mcg by mouth daily.    . Ascorbic Acid (VITAMIN C) 1000 MG tablet Take 1,000 mg by mouth daily.    . calcium carbonate (OSCAL) 1500 (600 Ca) MG TABS tablet Take 1,500 mg by mouth 2 (two) times daily with a meal.    . carvedilol (COREG) 3.125  MG tablet 2 tabs daily    . donepezil (ARICEPT) 5 MG tablet TAKE 1 TABLET (5 MG TOTAL) BY MOUTH AT BEDTIME. (Patient not taking: Reported on 11/28/2017) 90 tablet 3  . LANTUS SOLOSTAR 100 UNIT/ML Solostar Pen INJECT 30 UNITS SUBCUTANEOUSLY EVERY MORNING 15 pen 0  . Melatonin 5 MG TABS Take 5 mg by mouth at bedtime.    Marland Kitchen nystatin (MYCOSTATIN/NYSTOP) powder Apply topically 4 (four) times daily. (Patient not taking: Reported on 11/28/2017) 45 g 1  . nystatin-triamcinolone ointment (MYCOLOG) Apply 1 application topically 2 (two) times daily. (Patient not taking: Reported on 11/28/2017) 30 g 0  . omeprazole (PRILOSEC) 20 MG capsule Take 20 mg by mouth daily.    Vladimir Faster Glycol-Propyl Glycol (SYSTANE FREE OP) Apply 1 drop to eye 3 (three) times daily.    . pregabalin (LYRICA) 300 MG capsule Lyrica 300 mg capsule    . trimethoprim (TRIMPEX) 100 MG tablet Take 100 mg by mouth daily.  11  . zolpidem (AMBIEN) 5 MG tablet Take 5 mg by mouth at bedtime.     No current facility-administered medications for this visit.     REVIEW OF SYSTEMS:   Constitutional: Denies fevers, chills or abnormal night sweats Ears, nose, mouth, throat, and face: Denies mucositis or sore throat Respiratory: Denies hemoptysis or wheezes (+) chronic productive cough white sputum (+) DOE, at baseline  Cardiovascular: Denies palpitation, chest discomfort or lower extremity swelling Gastrointestinal:  Denies nausea, vomiting, constipation, diarrhea, hematochezia, heartburn or change in bowel habits Skin: Denies abnormal skin rashes Lymphatics: Denies new lymphadenopathy or easy bruising Neurological:Denies numbness, tingling or new weaknesses (+) frequent fall episodes  Behavioral/Psych: Mood is stable, no new changes  MSK: (+) chronic shoulder, hand, knee pain  All other  systems were reviewed with the patient and are negative.  PHYSICAL EXAMINATION: ECOG PERFORMANCE STATUS: 1 - Symptomatic but completely ambulatory  Vitals:    11/28/17 1330  BP: (!) 157/91  Pulse: 89  Resp: 17  Temp: 97.9 F (36.6 C)  SpO2: 99%   Filed Weights   11/28/17 1330  Weight: 147 lb 14.4 oz (67.1 kg)    GENERAL:alert, no distress and comfortable SKIN: skin color, texture, turgor are normal, no rashes or significant lesions EYES: sclera clear OROPHARYNX:no thrush or ulcers  LYMPH:  no palpable cervical, supraclavicular, or axillary lymphadenopathy LUNGS: clear to auscultation with normal breathing effort HEART: regular rate & rhythm, no lower extremity edema ABDOMEN:abdomen soft, non-tender and normal bowel sounds Musculoskeletal:no cyanosis of digits and no clubbing  PSYCH: alert & oriented x 3 with fluent speech NEURO: no focal sensory deficits, ambulates with walker   LABORATORY DATA:  I have reviewed the data as listed CBC Latest Ref Rng & Units 11/28/2017 10/17/2017 10/12/2017  WBC 3.9 - 10.3 K/uL 11.5(H) 17.1(H) 12.9(H)  Hemoglobin 11.6 - 15.9 g/dL 11.7 11.7(L) 10.3(L)  Hematocrit 34.8 - 46.6 % 36.3 35.1(L) 32.4(L)  Platelets 145 - 400 K/uL 304 433.0(H) 305    CMP Latest Ref Rng & Units 10/17/2017 10/10/2017 10/09/2017  Glucose 70 - 99 mg/dL 55 Repeated and verified X2.(L) 115(H) 139(H)  BUN 6 - 23 mg/dL 21 13 16   Creatinine 0.40 - 1.20 mg/dL 1.04 0.79 0.88  Sodium 135 - 145 mEq/L 139 145 135  Potassium 3.5 - 5.1 mEq/L 4.2 3.9 4.0  Chloride 96 - 112 mEq/L 100 105 100  CO2 19 - 32 mEq/L 29 29 25   Calcium 8.4 - 10.5 mg/dL 9.8 9.0 8.7(L)  Total Protein 6.0 - 8.3 g/dL 7.0 - 6.9  Total Bilirubin 0.2 - 1.2 mg/dL 0.4 - 0.9  Alkaline Phos 39 - 117 U/L 48 - 65  AST 0 - 37 U/L 16 - 18  ALT 0 - 35 U/L 25 - 23     RADIOGRAPHIC STUDIES: I have personally reviewed the radiological images as listed and agreed with the findings in the report. Dg Chest 2 View  Result Date: 11/07/2017 CLINICAL DATA:  Follow-up pneumonia EXAM: CHEST - 2 VIEW COMPARISON:  10/09/2017 and prior radiographs FINDINGS: The cardiomediastinal silhouette  is unremarkable. LEFT LOWER lobe airspace opacities have resolved. There is no evidence of focal airspace disease, pulmonary edema, suspicious pulmonary nodule/mass, pleural effusion, or pneumothorax. No acute bony abnormalities are identified. Remote rib fractures are again noted. IMPRESSION: Resolved LEFT LOWER lung pneumonia.  No acute abnormality. Electronically Signed   By: Margarette Canada M.D.   On: 11/07/2017 14:57    ASSESSMENT & PLAN: 82 year old caucasian female with history of PMR, COPD, chronic steroid use, and multiple co-morbidities with leukocytosis   1. Leukocytosis with predominate neutrophilia  -We reviewed her medical record in detail with the patient today, which indicates chronic leukocytosis with predominant neutrophils that increases during acute infection, most recently in 09/2017 with pneumonia.  -Dr. Burr Medico reviewed causes of leukocytosis including reaction to infection/inflammation or blood loss such as in iron deficiency, nutritional deficiencies, medication, and MPN.  -Will obtain labs today; given she had mild intermittent anemia and macrocytosis, will check iron studies, K24, folic acid. Will check BCR-ABL to r/o CML.  -CBC today shows WBC 11.5, ANC 7.6; Dr. Burr Medico feels this is most likely reactive to infection and chronic steroids, this is a benign finding.  -Will call her when all labs  have resulted; if work up is otherwise negative she can f/u with PCP and with [place] only as needed. She agrees.  -Will copy PCP.   PLAN:  -Labs today -Call patient with results  -F/u with PCP -Return only as needed   Orders Placed This Encounter  Procedures  . CBC with Differential (Cancer Center Only)    Standing Status:   Standing    Number of Occurrences:   20    Standing Expiration Date:   11/29/2023  . Iron and TIBC    Standing Status:   Standing    Number of Occurrences:   1    Standing Expiration Date:   11/29/2018  . Ferritin    Standing Status:   Standing    Number of  Occurrences:   1    Standing Expiration Date:   11/29/2018  . BCR ABL1 FISH ([facility])    R/o CML    Standing Status:   Standing    Number of Occurrences:   1    Standing Expiration Date:   11/29/2018  . Vitamin B12    Standing Status:   Standing    Number of Occurrences:   3    Standing Expiration Date:   11/29/2018  . Methylmalonic acid, serum    Standing Status:   Standing    Number of Occurrences:   1    Standing Expiration Date:   11/29/2018  . Folate RBC    Standing Status:   Standing    Number of Occurrences:   1    Standing Expiration Date:   11/29/2018     All questions were answered. The patient knows to call the clinic with any problems, questions or concerns. I spent 45 minutes counseling the patient face to face. The total time spent in the appointment was 60 minutes and more than 50% was on counseling.     Alla Feeling, NP 11/28/17   Addendum  I have seen the patient, examined her. I agree with the assessment and and plan and have edited the notes.   Kayla Thompson is a pleasant 82 yo female with PMH of  PMR, COPD, chronic steroid use, and multiple co-morbidities with leukocytosis, with predominant neutrophils.  I think this is likely reactive, secondary to recurrent infections, chronic steroid use, and her multiple comorbidities.  Given his overall stable, and fluctuating leukocytosis, this is unlikely MPN or CML.  Moderate anemia, and one episode of mild thrombocytosis, I will obtain iron study to rule out deficiency, which can also cause reactive leukocytosis.  I will also do BCR able fish to rule out CML.  If the above test results are normal/negative, then I recommend her to follow-up with her primary care physician, and see [place] as needed in the future.  Patient voiced good understanding, and agrees with the plan.  All questions were answered.  Truitt Merle  11/28/2017

## 2017-11-28 NOTE — Progress Notes (Signed)
Subjective:   Kayla Thompson is a 82 y.o. female who presents for an Initial Medicare Annual Wellness Visit.  Review of Systems    No ROS.  Medicare Wellness Visit. Additional risk factors are reflected in the social history. Cardiac Risk Factors include: advanced age (>76men, >15 women);diabetes mellitus;dyslipidemia;hypertension Sleep patterns: gets up 2 times nightly to void and sleeps 6-7 hours nightly.    Home Safety/Smoke Alarms: Feels safe in home. Smoke alarms in place.  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 Lives alone, no needs for DME, good support system, has aid who comes daily Seat Belt Safety/Bike Helmet: Wears seat belt.      Objective:    Today's Vitals   11/29/17 1535 11/29/17 1555  BP: 127/62   Pulse: 83   Resp: 18   SpO2: 97%   Weight: 148 lb (67.1 kg)   Height: 5' (1.524 m)   PainSc:  8    Body mass index is 28.9 kg/m.  Advanced Directives 11/29/2017 10/11/2017 10/09/2017 04/07/2017 03/28/2015 08/02/2014 08/02/2014  Does Patient Have a Medical Advance Directive? Yes - No Yes Yes Yes No  Type of Paramedic of [place];Living will - - [place];Living will - Living will;Healthcare Power of Attorney -  Does patient want to make changes to medical advance directive? - - - - - No - Patient declined -  Copy of [place] in Chart? No - copy requested - - - - No - copy requested -  Would patient like information on creating a medical advance directive? - No - Patient declined - - - No - patient declined information No - patient declined information    Current Medications (verified) Outpatient Encounter Medications as of 11/29/2017  Medication Sig  . albuterol (PROVENTIL HFA;VENTOLIN HFA) 108 (90 Base) MCG/ACT inhaler Inhale 2 puffs into the lungs every 6 (six) hours as needed for  wheezing or shortness of breath.  . Ascorbic Acid (VITAMIN C) 1000 MG tablet Take 1,000 mg by mouth daily.  . BD PEN NEEDLE NANO U/F 32G X 4 MM MISC USE 4 TIMES A DAY  . calcium carbonate (OSCAL) 1500 (600 Ca) MG TABS tablet Take 1,500 mg by mouth 2 (two) times daily with a meal.  . Calcium Carbonate-Vit D-Min (CALTRATE 600+D PLUS MINERALS PO) Take 1 tablet by mouth 2 (two) times daily.  . carvedilol (COREG) 3.125 MG tablet 2 tabs daily  . Cholecalciferol (VITAMIN D3) 2000 units TABS Take 2 tablets by mouth daily.   . cycloSPORINE (RESTASIS) 0.05 % ophthalmic emulsion Place 1 drop into both eyes 2 (two) times daily.  Marland Kitchen denosumab (PROLIA) 60 MG/ML SOLN injection Inject 60 mg into the skin every 6 (six) months. Administer in upper arm, thigh, or abdomen  . Dexlansoprazole 30 MG capsule Take 30 mg by mouth daily.  Marland Kitchen Dextromethorphan-Guaifenesin (MUCINEX DM MAXIMUM STRENGTH) 60-1200 MG TB12 Take 1 tablet by mouth 2 (two) times daily. Reported on 06/23/2015  . donepezil (ARICEPT) 5 MG tablet TAKE 1 TABLET (5 MG TOTAL) BY MOUTH AT BEDTIME.  . DULoxetine HCl (CYMBALTA PO) Take 90 mg by mouth daily.  . fluticasone (FLONASE) 50 MCG/ACT nasal spray Place 2 sprays into both nostrils daily.  . Fluticasone-Salmeterol (ADVAIR) 500-50 MCG/DOSE AEPB Inhale 1 puff into the lungs 2 (two) times daily.  Marland Kitchen glucose blood (FREESTYLE TEST STRIPS) test strip Use to test blood sugar  3 times daily. Dx: E11.65  . insulin glargine (LANTUS) 100 UNIT/ML injection Inject 20 Units into the skin every morning.  . insulin lispro (HUMALOG KWIKPEN) 100 UNIT/ML KiwkPen Inject 4-6 units into the skin daily. (Patient taking differently: Inject 4 Units into the skin daily. )  . Lancets (FREESTYLE) lancets Use to test blood sugar 3 times daily. Dx: E11.65  . levothyroxine (SYNTHROID, LEVOTHROID) 50 MCG tablet Take 1 tablet (50 mcg total) by mouth daily before breakfast.  . Melatonin 5 MG TABS Take 5 mg by mouth at bedtime.  . montelukast  (SINGULAIR) 10 MG tablet TAKE 1 TABLET (10 MG TOTAL) BY MOUTH DAILY.  . Multiple Vitamins-Minerals (CENTRUM ADULTS PO) Take by mouth.  . Omega-3 Fatty Acids (FISH OIL) 1000 MG CAPS Take by mouth.  Marland Kitchen omeprazole (PRILOSEC) 20 MG capsule Take 20 mg by mouth daily.  Vladimir Faster Glycol-Propyl Glycol (SYSTANE FREE OP) Apply 1 drop to eye 3 (three) times daily.  . predniSONE (DELTASONE) 1 MG tablet Take 2 mg by mouth daily.   . pregabalin (LYRICA) 300 MG capsule Lyrica 300 mg capsule  . ranitidine (ZANTAC) 150 MG tablet Take 150 mg by mouth 2 (two) times daily as needed for heartburn.  . rosuvastatin (CRESTOR) 10 MG tablet TAKE 1 TABLET (10 MG TOTAL) BY MOUTH EVERY EVENING.  . SPIRIVA HANDIHALER 18 MCG inhalation capsule PLACE 1 CAPSULE INTO INHALER AND INHALE AS DIRECTED EVERY DAY  . trimethoprim (TRIMPEX) 100 MG tablet Take 100 mg by mouth daily.  . vitamin B-12 (CYANOCOBALAMIN) 250 MCG tablet Take 250 mcg by mouth daily.  Marland Kitchen zolpidem (AMBIEN) 5 MG tablet Take 5 mg by mouth at bedtime.  . [DISCONTINUED] levothyroxine (SYNTHROID, LEVOTHROID) 50 MCG tablet Take 1 tablet (50 mcg total) by mouth daily before breakfast.  . [DISCONTINUED] carvedilol (COREG) 6.25 MG tablet Take 0.5 tablets (3.125 mg total) by mouth 2 (two) times daily with a meal. (Patient not taking: Reported on 11/29/2017)  . [DISCONTINUED] LANTUS SOLOSTAR 100 UNIT/ML Solostar Pen INJECT 30 UNITS SUBCUTANEOUSLY EVERY MORNING (Patient not taking: Reported on 11/29/2017)  . [DISCONTINUED] nystatin (MYCOSTATIN/NYSTOP) powder Apply topically 4 (four) times daily. (Patient not taking: Reported on 11/29/2017)  . [DISCONTINUED] nystatin-triamcinolone ointment (MYCOLOG) Apply 1 application topically 2 (two) times daily. (Patient not taking: Reported on 11/29/2017)   No facility-administered encounter medications on file as of 11/29/2017.     Allergies (verified) Latex and Penicillins   History: Past Medical History:  Diagnosis Date  . Arthritis    . Asthma   . Diabetes mellitus without complication ([place])   . Fibromyalgia   . GERD (gastroesophageal reflux disease)   . History of chemotherapy   . History of fractured pelvis   . Hypertension   . Osteoporosis   . Osteoporosis   . Ovarian cancer [facility])    Past Surgical History:  Procedure Laterality Date  . ABDOMINAL HYSTERECTOMY  1996  . ANKLE FRACTURE SURGERY     plate and screws  . BLADDER SUSPENSION    . CATARACT EXTRACTION    . EYE SURGERY    . hip surgey    . knee surgey    . LUNG SURGERY    . OOPHORECTOMY     BSO   Family History  Problem Relation Age of Onset  . Lung cancer Mother   . CAD Father   . Diabetes Father   . Lung cancer Maternal Aunt   . Diabetes Paternal Aunt   . Diabetes Paternal Uncle   .  Diabetes Paternal Grandmother   . Diabetes Paternal Grandfather    Social History   Socioeconomic History  . Marital status: Widowed    Spouse name: Not on file  . Number of children: 2  . Years of education: Not on file  . Highest education level: Not on file  Occupational History  . Not on file  Social Needs  . Financial resource strain: Not hard at all  . Food insecurity:    Worry: Never true    Inability: Never true  . Transportation needs:    Medical: No    Non-medical: No  Tobacco Use  . Smoking status: Never Smoker  . Smokeless tobacco: Never Used  Substance and Sexual Activity  . Alcohol use: No    Alcohol/week: 0.0 standard drinks  . Drug use: No  . Sexual activity: Not Currently    Comment: declined insuracne questions  Lifestyle  . Physical activity:    Days per week: 0 days    Minutes per session: 0 min  . Stress: Not at all  Relationships  . Social connections:    Talks on phone: More than three times a week    Gets together: More than three times a week    Attends religious service: 1 to 4 times per year    Active member of club or organization: Yes    Attends meetings of clubs or organizations: 1 to 4 times per year     Relationship status: Widowed  Other Topics Concern  . Not on file  Social History Narrative  . Not on file    Tobacco Counseling Counseling given: Not Answered  Activities of Daily Living In your present state of health, do you have any difficulty performing the following activities: 11/29/2017 10/09/2017  Hearing? N -  Vision? N -  Comment - -  Difficulty concentrating or making decisions? Y -  Walking or climbing stairs? Y -  Comment - -  Dressing or bathing? Y -  Doing errands, shopping? Y N  Preparing Food and eating ? Y -  Using the Toilet? N -  In the past six months, have you accidently leaked urine? Y -  Comment followed by urology -  Do you have problems with loss of bowel control? N -  Managing your Medications? N -  Managing your Finances? N -  Housekeeping or managing your Housekeeping? Y -  Some recent data might be hidden     Immunizations and Health Maintenance Immunization History  Administered Date(s) Administered  . Influenza, High Dose Seasonal PF 12/01/2016  . Influenza,inj,Quad PF,6+ Mos 01/17/2015, 12/18/2015  . Pneumococcal Polysaccharide-23 12/18/2015  . Tdap 08/02/2014, 04/07/2017   Health Maintenance Due  Topic Date Due  . FOOT EXAM  06/19/1945  . URINE MICROALBUMIN  06/19/1945  . OPHTHALMOLOGY EXAM  09/20/2015  . INFLUENZA VACCINE  10/20/2017    Patient Care Team: [name], MD as PCP - General (Internal Medicine)  Indicate any recent Medical Services you may have received from other than [facility] providers in the past year (date may be approximate).     Assessment:   This is a routine wellness examination for Petina. Physical assessment deferred to PCP.   Hearing/Vision screen Hearing Screening Comments: Able to hear conversational tones w/o difficulty. No issues reported.  Passed whisper test Vision Screening Comments: appointment every 6 months Dr. Gershon Crane  Dietary issues and exercise activities discussed: Current  Exercise Habits: The patient does not participate in regular exercise at present(HH  PT ), Exercise limited by: orthopedic condition(s)  Diet (meal preparation, eat out, water intake, caffeinated beverages, dairy products, fruits and vegetables): in general, a "healthy" diet  , well balanced   Reviewed heart healthy and diabetic diet. Encouraged patient to increase daily water and healthy fluid intake.  Goals    . Patient Stated     Stay as healthy and as independent as possible      Depression Screen PHQ 2/9 Scores 11/29/2017 12/01/2016 10/01/2015 03/28/2015 01/14/2015  PHQ - 2 Score 2 2 0 1 0  PHQ- 9 Score 5 7 - - -    Fall Risk Fall Risk  11/29/2017 12/01/2016 10/01/2015 03/28/2015 01/14/2015  Falls in the past year? Yes Yes No Yes No  Number falls in past yr: 2 or more 1 - 1 -  Injury with Fall? Yes Yes - Yes -  Risk Factor Category  High Fall Risk - - High Fall Risk -  Risk for fall due to : - - - History of fall(s);Impaired balance/gait;Impaired mobility -  Follow up Education provided;Falls prevention discussed - - - -   Cognitive Function: MMSE - Mini Mental State Exam 11/29/2017  Not completed: Refused        Screening Tests Health Maintenance  Topic Date Due  . FOOT EXAM  06/19/1945  . URINE MICROALBUMIN  06/19/1945  . OPHTHALMOLOGY EXAM  09/20/2015  . INFLUENZA VACCINE  10/20/2017  . TETANUS/TDAP  04/08/2027  . DEXA SCAN  Completed  . PNA vac Low Risk Adult  Addressed     Plan:     Continue doing brain stimulating activities (puzzles, reading, adult coloring books, staying active) to keep memory sharp.   Continue to eat heart healthy diet (full of fruits, vegetables, whole grains, lean protein, water--limit salt, fat, and sugar intake) and increase physical activity as tolerated.  I have personally reviewed and noted the following in the patient's chart:   . Medical and social history . Use of alcohol, tobacco or illicit drugs  . Current medications and  supplements . Functional ability and status . Nutritional status . Physical activity . Advanced directives . List of other physicians . Vitals . Screenings to include cognitive, depression, and falls . Referrals and appointments  In addition, I have reviewed and discussed with patient certain preventive protocols, quality metrics, and best practice recommendations. A written personalized care plan for preventive services as well as general preventive health recommendations were provided to patient.     [name], RN   11/29/2017

## 2017-11-28 NOTE — Telephone Encounter (Signed)
Gave pt avs °

## 2017-11-29 ENCOUNTER — Ambulatory Visit (INDEPENDENT_AMBULATORY_CARE_PROVIDER_SITE_OTHER): Payer: Medicare Other | Admitting: *Deleted

## 2017-11-29 ENCOUNTER — Telehealth: Payer: Self-pay | Admitting: *Deleted

## 2017-11-29 VITALS — BP 127/62 | HR 83 | Resp 18 | Ht 60.0 in | Wt 148.0 lb

## 2017-11-29 DIAGNOSIS — Z0000 Encounter for general adult medical examination without abnormal findings: Secondary | ICD-10-CM

## 2017-11-29 DIAGNOSIS — Z23 Encounter for immunization: Secondary | ICD-10-CM | POA: Diagnosis not present

## 2017-11-29 DIAGNOSIS — E039 Hypothyroidism, unspecified: Secondary | ICD-10-CM | POA: Diagnosis not present

## 2017-11-29 LAB — IRON AND TIBC
Iron: 91 ug/dL (ref 41–142)
Saturation Ratios: 20 % — ABNORMAL LOW (ref 21–57)
TIBC: 445 ug/dL — ABNORMAL HIGH (ref 236–444)
UIBC: 354 ug/dL

## 2017-11-29 LAB — FOLATE RBC
Folate, Hemolysate: >620 ng/mL
Folate, RBC: >1680 ng/mL (ref >498)
Hematocrit: 36.9 % (ref 34.0–46.6)

## 2017-11-29 LAB — FERRITIN: Ferritin: 19 ng/mL (ref 11–307)

## 2017-11-29 MED ORDER — LEVOTHYROXINE SODIUM 50 MCG PO TABS: 50.0000 ug | ORAL_TABLET | Freq: Every day | ORAL | 1 refills | Status: DC

## 2017-11-29 NOTE — Patient Instructions (Signed)
Continue doing brain stimulating activities (puzzles, reading, adult coloring books, staying active) to keep memory sharp.   Continue to eat heart healthy diet (full of fruits, vegetables, whole grains, lean protein, water--limit salt, fat, and sugar intake) and increase physical activity as tolerated.  Health Maintenance, Female Adopting a healthy lifestyle and getting preventive care can go a long way to promote health and wellness. Talk with your health care provider about what schedule of regular examinations is right for you. This is a good chance for you to check in with your provider about disease prevention and staying healthy. In between checkups, there are plenty of things you can do on your own. Experts have done a lot of research about which lifestyle changes and preventive measures are most likely to keep you healthy. Ask your health care provider for more information. Weight and diet Eat a healthy diet  Be sure to include plenty of vegetables, fruits, low-fat dairy products, and lean protein.  Do not eat a lot of foods high in solid fats, added sugars, or salt.  Get regular exercise. This is one of the most important things you can do for your health. ? Most adults should exercise for at least 150 minutes each week. The exercise should increase your heart rate and make you sweat (moderate-intensity exercise). ? Most adults should also do strengthening exercises at least twice a week. This is in addition to the moderate-intensity exercise.  Maintain a healthy weight  Body mass index (BMI) is a measurement that can be used to identify possible weight problems. It estimates body fat based on height and weight. Your health care provider can help determine your BMI and help you achieve or maintain a healthy weight.  For females 40 years of age and older: ? A BMI below 18.5 is considered underweight. ? A BMI of 18.5 to 24.9 is normal. ? A BMI of 25 to 29.9 is considered  overweight. ? A BMI of 30 and above is considered obese.  Watch levels of cholesterol and blood lipids  You should start having your blood tested for lipids and cholesterol at 80 years of age, then have this test every 5 years.  You may need to have your cholesterol levels checked more often if: ? Your lipid or cholesterol levels are high. ? You are older than 80 years of age. ? You are at high risk for heart disease.  Cancer screening Lung Cancer  Lung cancer screening is recommended for adults 53-15 years old who are at high risk for lung cancer because of a history of smoking.  A yearly low-dose CT scan of the lungs is recommended for people who: ? Currently smoke. ? Have quit within the past 15 years. ? Have at least a 30-pack-year history of smoking. A pack year is smoking an average of one pack of cigarettes a day for 1 year.  Yearly screening should continue until it has been 15 years since you quit.  Yearly screening should stop if you develop a health problem that would prevent you from having lung cancer treatment.  Breast Cancer  Practice breast self-awareness. This means understanding how your breasts normally appear and feel.  It also means doing regular breast self-exams. Let your health care provider know about any changes, no matter how small.  If you are in your 20s or 30s, you should have a clinical breast exam (CBE) by a health care provider every 1-3 years as part of a regular health exam.  If  you are 40 or older, have a CBE every year. Also consider having a breast X-ray (mammogram) every year.  If you have a family history of breast cancer, talk to your health care provider about genetic screening.  If you are at high risk for breast cancer, talk to your health care provider about having an MRI and a mammogram every year.  Breast cancer gene (BRCA) assessment is recommended for women who have family members with BRCA-related cancers. BRCA-related cancers  include: ? Breast. ? Ovarian. ? Tubal. ? Peritoneal cancers.  Results of the assessment will determine the need for genetic counseling and BRCA1 and BRCA2 testing.  Cervical Cancer Your health care provider may recommend that you be screened regularly for cancer of the pelvic organs (ovaries, uterus, and vagina). This screening involves a pelvic examination, including checking for microscopic changes to the surface of your cervix (Pap test). You may be encouraged to have this screening done every 3 years, beginning at age 34.  For women ages 5-65, health care providers may recommend pelvic exams and Pap testing every 3 years, or they may recommend the Pap and pelvic exam, combined with testing for human papilloma virus (HPV), every 5 years. Some types of HPV increase your risk of cervical cancer. Testing for HPV may also be done on women of any age with unclear Pap test results.  Other health care providers may not recommend any screening for nonpregnant women who are considered low risk for pelvic cancer and who do not have symptoms. Ask your health care provider if a screening pelvic exam is right for you.  If you have had past treatment for cervical cancer or a condition that could lead to cancer, you need Pap tests and screening for cancer for at least 20 years after your treatment. If Pap tests have been discontinued, your risk factors (such as having a new sexual partner) need to be reassessed to determine if screening should resume. Some women have medical problems that increase the chance of getting cervical cancer. In these cases, your health care provider may recommend more frequent screening and Pap tests.  Colorectal Cancer  This type of cancer can be detected and often prevented.  Routine colorectal cancer screening usually begins at 80 years of age and continues through 80 years of age.  Your health care provider may recommend screening at an earlier age if you have risk factors  for colon cancer.  Your health care provider may also recommend using home test kits to check for hidden blood in the stool.  A small camera at the end of a tube can be used to examine your colon directly (sigmoidoscopy or colonoscopy). This is done to check for the earliest forms of colorectal cancer.  Routine screening usually begins at age 70.  Direct examination of the colon should be repeated every 5-10 years through 80 years of age. However, you may need to be screened more often if early forms of precancerous polyps or small growths are found.  Skin Cancer  Check your skin from head to toe regularly.  Tell your health care provider about any new moles or changes in moles, especially if there is a change in a mole's shape or color.  Also tell your health care provider if you have a mole that is larger than the size of a pencil eraser.  Always use sunscreen. Apply sunscreen liberally and repeatedly throughout the day.  Protect yourself by wearing long sleeves, pants, a wide-brimmed hat, and sunglasses  whenever you are outside.  Heart disease, diabetes, and high blood pressure  High blood pressure causes heart disease and increases the risk of stroke. High blood pressure is more likely to develop in: ? People who have blood pressure in the high end of the normal range (130-139/85-89 mm Hg). ? People who are overweight or obese. ? People who are African American.  If you are 64-28 years of age, have your blood pressure checked every 3-5 years. If you are 58 years of age or older, have your blood pressure checked every year. You should have your blood pressure measured twice-once when you are at a hospital or clinic, and once when you are not at a hospital or clinic. Record the average of the two measurements. To check your blood pressure when you are not at a hospital or clinic, you can use: ? An automated blood pressure machine at a pharmacy. ? A home blood pressure monitor.  If  you are between 46 years and 42 years old, ask your health care provider if you should take aspirin to prevent strokes.  Have regular diabetes screenings. This involves taking a blood sample to check your fasting blood sugar level. ? If you are at a normal weight and have a low risk for diabetes, have this test once every three years after 80 years of age. ? If you are overweight and have a high risk for diabetes, consider being tested at a younger age or more often. Preventing infection Hepatitis B  If you have a higher risk for hepatitis B, you should be screened for this virus. You are considered at high risk for hepatitis B if: ? You were born in a country where hepatitis B is common. Ask your health care provider which countries are considered high risk. ? Your parents were born in a high-risk country, and you have not been immunized against hepatitis B (hepatitis B vaccine). ? You have HIV or AIDS. ? You use needles to inject street drugs. ? You live with someone who has hepatitis B. ? You have had sex with someone who has hepatitis B. ? You get hemodialysis treatment. ? You take certain medicines for conditions, including cancer, organ transplantation, and autoimmune conditions.  Hepatitis C  Blood testing is recommended for: ? Everyone born from [birth year] through 1963. ? Anyone with known risk factors for hepatitis C.  Sexually transmitted infections (STIs)  You should be screened for sexually transmitted infections (STIs) including gonorrhea and chlamydia if: ? You are sexually active and are younger than 80 years of age. ? You are older than 80 years of age and your health care provider tells you that you are at risk for this type of infection. ? Your sexual activity has changed since you were last screened and you are at an increased risk for chlamydia or gonorrhea. Ask your health care provider if you are at risk.  If you do not have HIV, but are at risk, it may be recommended  that you take a prescription medicine daily to prevent HIV infection. This is called pre-exposure prophylaxis (PrEP). You are considered at risk if: ? You are sexually active and do not regularly use condoms or know the HIV status of your partner(s). ? You take drugs by injection. ? You are sexually active with a partner who has HIV.  Talk with your health care provider about whether you are at high risk of being infected with HIV. If you choose to begin PrEP, you should  first be tested for HIV. You should then be tested every 3 months for as long as you are taking PrEP. Pregnancy  If you are premenopausal and you may become pregnant, ask your health care provider about preconception counseling.  If you may become pregnant, take 400 to 800 micrograms (mcg) of folic acid every day.  If you want to prevent pregnancy, talk to your health care provider about birth control (contraception). Osteoporosis and menopause  Osteoporosis is a disease in which the bones lose minerals and strength with aging. This can result in serious bone fractures. Your risk for osteoporosis can be identified using a bone density scan.  If you are 103 years of age or older, or if you are at risk for osteoporosis and fractures, ask your health care provider if you should be screened.  Ask your health care provider whether you should take a calcium or vitamin D supplement to lower your risk for osteoporosis.  Menopause may have certain physical symptoms and risks.  Hormone replacement therapy may reduce some of these symptoms and risks. Talk to your health care provider about whether hormone replacement therapy is right for you. Follow these instructions at home:  Schedule regular health, dental, and eye exams.  Stay current with your immunizations.  Do not use any tobacco products including cigarettes, chewing tobacco, or electronic cigarettes.  If you are pregnant, do not drink alcohol.  If you are  breastfeeding, limit how much and how often you drink alcohol.  Limit alcohol intake to no more than 1 drink per day for nonpregnant women. One drink equals 12 ounces of beer, 5 ounces of , or 1 ounces of hard liquor.  Do not use street drugs.  Do not share needles.  Ask your health care provider for help if you need support or information about quitting drugs.  Tell your health care provider if you often feel depressed.  Tell your health care provider if you have ever been abused or do not feel safe at home. This information is not intended to replace advice given to you by your health care provider. Make sure you discuss any questions you have with your health care provider. Document Released: 09/21/2010 Document Revised: 08/14/2015 Document Reviewed: 12/10/2014 Elsevier Interactive Patient Education  2018 [place].   Influenza Virus Vaccine injection What is this medicine? INFLUENZA VIRUS VACCINE (in floo EN zuh VAHY ruhs vak SEEN) helps to reduce the risk of getting influenza also known as the flu. The vaccine only helps protect you against some strains of the flu. This medicine may be used for other purposes; ask your health care provider or pharmacist if you have questions. COMMON BRAND NAME(S): Afluria, Agriflu, Alfuria, FLUAD, Fluarix, Fluarix Quadrivalent, Flublok, Flublok Quadrivalent, FLUCELVAX, Flulaval, Fluvirin, Fluzone, Fluzone High-Dose, Fluzone Intradermal What should I tell my health care provider before I take this medicine? They need to know if you have any of these conditions: -bleeding disorder like hemophilia -fever or infection -Guillain-Barre syndrome or other neurological problems -immune system problems -infection with the human immunodeficiency virus (HIV) or AIDS -low blood platelet counts -multiple sclerosis -an unusual or allergic reaction to influenza virus vaccine, latex, other medicines, foods, dyes, or preservatives. Different brands of  vaccines contain different allergens. Some may contain latex or eggs. Talk to your doctor about your allergies to make sure that you get the right vaccine. -pregnant or trying to get pregnant -breast-feeding How should I use this medicine? This vaccine is for injection into a muscle  or under the skin. It is given by a health care professional. A copy of Vaccine Information Statements will be given before each vaccination. Read this sheet carefully each time. The sheet may change frequently. Talk to your healthcare provider to see which vaccines are right for you. Some vaccines should not be used in all age groups. Overdosage: If you think you have taken too much of this medicine contact a poison control center or emergency room at once. NOTE: This medicine is only for you. Do not share this medicine with others. What if I miss a dose? This does not apply. What may interact with this medicine? -chemotherapy or radiation therapy -medicines that lower your immune system like etanercept, anakinra, infliximab, and adalimumab -medicines that treat or prevent blood clots like warfarin -phenytoin -steroid medicines like prednisone or cortisone -theophylline -vaccines This list may not describe all possible interactions. Give your health care provider a list of all the medicines, herbs, non-prescription drugs, or dietary supplements you use. Also tell them if you smoke, drink alcohol, or use illegal drugs. Some items may interact with your medicine. What should I watch for while using this medicine? Report any side effects that do not go away within 3 days to your doctor or health care professional. Call your health care provider if any unusual symptoms occur within 6 weeks of receiving this vaccine. You may still catch the flu, but the illness is not usually as bad. You cannot get the flu from the vaccine. The vaccine will not protect against colds or other illnesses that may cause fever. The vaccine  is needed every year. What side effects may I notice from receiving this medicine? Side effects that you should report to your doctor or health care professional as soon as possible: -allergic reactions like skin rash, itching or hives, swelling of the face, lips, or tongue Side effects that usually do not require medical attention (report to your doctor or health care professional if they continue or are bothersome): -fever -headache -muscle aches and pains -pain, tenderness, redness, or swelling at the injection site -tiredness This list may not describe all possible side effects. Call your doctor for medical advice about side effects. You may report side effects to FDA at 1-800-FDA-1088. Where should I keep my medicine? The vaccine will be given by a health care professional in a clinic, pharmacy, doctor's office, or other health care setting. You will not be given vaccine doses to store at home. NOTE: This sheet is a summary. It may not cover all possible information. If you have questions about this medicine, talk to your doctor, pharmacist, or health care provider.  2018 Elsevier/Gold Standard (2014-09-27 10:07:28)  It is important to avoid accidents which may result in broken bones.  Here are a few ideas on how to make your home safer so you will be less likely to trip or fall.  1. Use nonskid mats or non slip strips in your shower or tub, on your bathroom floor and around sinks.  If you know that you have spilled water, wipe it up! 2. In the bathroom, it is important to have properly installed grab bars on the walls or on the edge of the tub.  Towel racks are NOT strong enough for you to hold onto or to pull on for support. 3. Stairs and hallways should have enough light.  Add lamps or night lights if you need ore light. 4. It is good to have handrails on both sides of the  stairs if possible.  Always fix broken handrails right away. 5. It is important to see the edges of steps.  Paint  the edges of outdoor steps white so you can see them better.  Put colored tape on the edge of inside steps. 6. Throw-rugs are dangerous because they can slide.  Removing the rugs is the best idea, but if they must stay, add adhesive carpet tape to prevent slipping. 7. Do not keep things on stairs or in the halls.  Remove small furniture that blocks the halls as it may cause you to trip.  Keep telephone and electrical cords out of the way where you walk. 8. Always were sturdy, rubber-soled shoes for good support.  Never wear just socks, especially on the stairs.  Socks may cause you to slip or fall.  Do not wear full-length housecoats as you can easily trip on the bottom.  9. Place the things you use the most on the shelves that are the easiest to reach.  If you use a stepstool, make sure it is in good condition.  If you feel unsteady, DO NOT climb, ask for help. 10. If a health professional advises you to use a cane or walker, do not be ashamed.  These items can keep you from falling and breaking your bones.

## 2017-11-29 NOTE — Telephone Encounter (Signed)
error 

## 2017-11-29 NOTE — Telephone Encounter (Signed)
Patient is requesting continuation of [facility] through [facility]. She also asked what PCP would recommend for her to take regarding chronic pain in left knee, left shoulder and hands. She currently is taking ES tylenol three times per day and states her pain level is 8.

## 2017-11-30 ENCOUNTER — Telehealth: Payer: Self-pay | Admitting: *Deleted

## 2017-11-30 DIAGNOSIS — I5032 Chronic diastolic (congestive) heart failure: Secondary | ICD-10-CM | POA: Diagnosis not present

## 2017-11-30 DIAGNOSIS — M1990 Unspecified osteoarthritis, unspecified site: Secondary | ICD-10-CM | POA: Diagnosis not present

## 2017-11-30 DIAGNOSIS — E119 Type 2 diabetes mellitus without complications: Secondary | ICD-10-CM | POA: Diagnosis not present

## 2017-11-30 DIAGNOSIS — I110 Hypertensive heart disease with heart failure: Secondary | ICD-10-CM | POA: Diagnosis not present

## 2017-11-30 DIAGNOSIS — J9601 Acute respiratory failure with hypoxia: Secondary | ICD-10-CM | POA: Diagnosis not present

## 2017-11-30 DIAGNOSIS — J45909 Unspecified asthma, uncomplicated: Secondary | ICD-10-CM | POA: Diagnosis not present

## 2017-11-30 LAB — METHYLMALONIC ACID, SERUM: Methylmalonic Acid, Quantitative: 264 nmol/L (ref 0–378)

## 2017-11-30 NOTE — Telephone Encounter (Signed)
Nurse called patient to inform her that PCP would like for her to schedule an appointment to address her pain issues and to discuss continuing PT. An appointment was made for 12/06/17.

## 2017-11-30 NOTE — Progress Notes (Signed)
Medical screening examination/treatment/procedure(s) were performed by non-physician practitioner and as supervising physician I was immediately available for consultation/collaboration. I agree with above. Elizabeth A Crawford, MD 

## 2017-11-30 NOTE — Telephone Encounter (Signed)
Would need a visit to discuss pain.

## 2017-12-05 ENCOUNTER — Ambulatory Visit: Payer: Medicare Other

## 2017-12-06 ENCOUNTER — Ambulatory Visit (INDEPENDENT_AMBULATORY_CARE_PROVIDER_SITE_OTHER): Payer: Medicare Other | Admitting: Internal Medicine

## 2017-12-06 ENCOUNTER — Encounter: Payer: Self-pay | Admitting: Internal Medicine

## 2017-12-06 DIAGNOSIS — J4530 Mild persistent asthma, uncomplicated: Secondary | ICD-10-CM

## 2017-12-06 DIAGNOSIS — Z794 Long term (current) use of insulin: Secondary | ICD-10-CM

## 2017-12-06 DIAGNOSIS — E1165 Type 2 diabetes mellitus with hyperglycemia: Secondary | ICD-10-CM | POA: Diagnosis not present

## 2017-12-06 DIAGNOSIS — K219 Gastro-esophageal reflux disease without esophagitis: Secondary | ICD-10-CM | POA: Diagnosis not present

## 2017-12-06 DIAGNOSIS — M797 Fibromyalgia: Secondary | ICD-10-CM | POA: Diagnosis not present

## 2017-12-06 MED ORDER — DULOXETINE HCL 30 MG PO CPEP: 30.0000 mg | ORAL_CAPSULE | Freq: Every day | ORAL | 3 refills | Status: DC

## 2017-12-06 MED ORDER — BACLOFEN 10 MG PO TABS: 5.0000 mg | ORAL_TABLET | Freq: Two times a day (BID) | ORAL | 0 refills | Status: DC | PRN

## 2017-12-06 MED ORDER — OMEPRAZOLE 20 MG PO CPDR: 20.0000 mg | DELAYED_RELEASE_CAPSULE | Freq: Every day | ORAL | 3 refills | Status: DC

## 2017-12-06 MED ORDER — FLUTICASONE PROPIONATE 50 MCG/ACT NA SUSP: 2.0000 | Freq: Every day | NASAL | 3 refills | Status: DC

## 2017-12-06 MED ORDER — DULOXETINE HCL 60 MG PO CPEP: 60.0000 mg | ORAL_CAPSULE | Freq: Every day | ORAL | 3 refills | Status: DC

## 2017-12-06 NOTE — Patient Instructions (Addendum)
The maximum amount of tylenol that you can safely take is 4 pills daily. You are taking too many currently and may have liver damage if you do this long term.   We have sent in baclofen to use 1/2 pill up to twice a day for pain as well.   You can use heat or ice also to help with pain or tiger balm over the counter.

## 2017-12-06 NOTE — Progress Notes (Signed)
   Subjective:    Patient ID: Kayla Thompson, female    DOB: 1935-08-11, 82 y.o.   MRN: [id number]  HPI The patient is an 82 YO female coming in for several concerns including fibromyalgia pain (needs refill on cymbalta, is taking tylenol as well for pain and taking 8 of the 650 mg tylenol pills daily, denies trying any other medications recently, used to take lyrica but felt that this was giving her tremors and several other side effects), and diabetes (needs letter for travel that she needs access to meter, needles, insulin, takes insulin with meal times, still seeing endocrinology but has not seen them in some time, denies low sugars, denies high sugars, diet is mediocre), and GERD (taking prilosec over the counter, wants a prescription, if she misses it she gets GERD badly, she is doing okay with taking it daily, denies vomiting or nausea, denies blood in stool or dark stools) and allergies (she is having worse time, has used flonase in the past, is not having worsening cough or SOB or wheezing currently, using nebulizers TID as usual, does not want pneumonia again or to end up in the hospital).   Review of Systems  Constitutional: Positive for activity change, appetite change and fatigue.  HENT: Positive for congestion, postnasal drip and rhinorrhea. Negative for ear discharge, ear pain, sinus pressure, sinus pain, sore throat and tinnitus.   Eyes: Negative.   Respiratory: Negative for cough, chest tightness and shortness of breath.   Cardiovascular: Negative for chest pain, palpitations and leg swelling.  Gastrointestinal: Negative for abdominal distention, abdominal pain, constipation, diarrhea, nausea and vomiting.  Musculoskeletal: Positive for arthralgias, back pain and myalgias.  Skin: Negative.   Neurological: Negative.   Psychiatric/Behavioral: Negative.       Objective:   Physical Exam  Constitutional: She is oriented to person, place, and time. She appears well-developed and  well-nourished.  HENT:  Head: Normocephalic and atraumatic.  Oropharynx with redness and clear drainage  Eyes: EOM are normal.  Neck: Normal range of motion.  Cardiovascular: Normal rate and regular rhythm.  Pulmonary/Chest: Effort normal and breath sounds normal. No respiratory distress. She has no wheezes. She has no rales.  Abdominal: Soft. Bowel sounds are normal. She exhibits no distension. There is no tenderness. There is no rebound.  Musculoskeletal: She exhibits tenderness. She exhibits no edema.  Diffuse global tenderness  Neurological: She is alert and oriented to person, place, and time. Coordination normal.  Skin: Skin is warm and dry.  Psychiatric: She has a normal mood and affect.   Vitals:   12/06/17 1308  BP: 110/70  Pulse: 85  Temp: 98.2 F (36.8 C)  TempSrc: Oral  SpO2: 96%  Weight: 148 lb (67.1 kg)  Height: 5' (1.524 m)      Assessment & Plan:

## 2017-12-07 NOTE — Assessment & Plan Note (Signed)
Rx for prilosec 20 mg daily to help with her symptoms.

## 2017-12-07 NOTE — Assessment & Plan Note (Signed)
Using insulin and letter provided of such for travel so she can have access to her insulin and needles and meter to check sugars while traveling.

## 2017-12-07 NOTE — Assessment & Plan Note (Signed)
Rx for baclofen for pain, advised that she is taking an excessive amount of tylenol and needs to limit dosing to 3000 mg daily which is the safe daily limit. Refill cymbalta to take total dose of 90 mg daily.

## 2017-12-07 NOTE — Assessment & Plan Note (Signed)
Refill flonase to help with allergy symptoms lately to avoid flare.

## 2017-12-08 ENCOUNTER — Telehealth: Payer: Self-pay | Admitting: Internal Medicine

## 2017-12-08 DIAGNOSIS — M1990 Unspecified osteoarthritis, unspecified site: Secondary | ICD-10-CM | POA: Diagnosis not present

## 2017-12-08 DIAGNOSIS — I5032 Chronic diastolic (congestive) heart failure: Secondary | ICD-10-CM | POA: Diagnosis not present

## 2017-12-08 DIAGNOSIS — J9601 Acute respiratory failure with hypoxia: Secondary | ICD-10-CM | POA: Diagnosis not present

## 2017-12-08 DIAGNOSIS — E119 Type 2 diabetes mellitus without complications: Secondary | ICD-10-CM | POA: Diagnosis not present

## 2017-12-08 DIAGNOSIS — M797 Fibromyalgia: Secondary | ICD-10-CM

## 2017-12-08 DIAGNOSIS — I110 Hypertensive heart disease with heart failure: Secondary | ICD-10-CM | POA: Diagnosis not present

## 2017-12-08 DIAGNOSIS — J45909 Unspecified asthma, uncomplicated: Secondary | ICD-10-CM | POA: Diagnosis not present

## 2017-12-08 MED ORDER — TRIAMCINOLONE ACETONIDE 0.1 % EX CREA: 1.0000 "application " | TOPICAL_CREAM | Freq: Two times a day (BID) | CUTANEOUS | 0 refills | Status: DC

## 2017-12-08 NOTE — Telephone Encounter (Signed)
Copied from [place] [phone]. Topic: Quick Communication - See Telephone Encounter >> Dec 08, 2017  4:43 PM Vernona Rieger wrote: CRM for notification. See Telephone encounter for: 12/08/17.  Liji, physical therapist with [facility] called and said she is discharging her from home health services. She would like to request a referral for her to go to outpatient therapy. Please let the patient know after that is done so she knows where she needs to go.    [phone], if you need to call back to Liji

## 2017-12-08 NOTE — Telephone Encounter (Signed)
Copied from [place] [id number]. Topic: Quick Communication - Rx Refill/Question >> Dec 08, 2017  4:32 PM Sheppard Coil, [employer] wrote: Medication:  Unsure name  Pt states she thought that the doctor was going to call in a cream for the rash on her hand but the pharmacy has no medication there for her.  Pt can be reached at [phone]  Preferred Pharmacy (with phone number or street name): [id number] [place], [place] [phone] (Phone) [phone] (Fax)  Agent: Please be advised that RX refills may take up to 3 business days. We ask that you follow-up with your pharmacy.

## 2017-12-08 NOTE — Telephone Encounter (Signed)
Referral to PT placed

## 2017-12-08 NOTE — Telephone Encounter (Signed)
No noted found on rash, please advise

## 2017-12-08 NOTE — Telephone Encounter (Signed)
Sent in cream use BID.

## 2017-12-08 NOTE — Telephone Encounter (Signed)
Patient informed rx has been sent!

## 2017-12-09 LAB — BCR ABL1 FISH (GENPATH)

## 2017-12-09 NOTE — Telephone Encounter (Signed)
Patient informed. 

## 2017-12-20 DIAGNOSIS — M810 Age-related osteoporosis without current pathological fracture: Secondary | ICD-10-CM

## 2017-12-20 HISTORY — DX: Age-related osteoporosis without current pathological fracture: M81.0

## 2017-12-23 ENCOUNTER — Telehealth: Payer: Self-pay

## 2017-12-23 NOTE — Telephone Encounter (Signed)
Left voice message with lab results, per Cira Rue NP, BCR/ABL test is negative, no evidence of SML.  Folic acid is normal.  Iron studies are low take iron supplement daily.  B12 is elevated, one take B12 once a week instead of daily.  WBC is slightly elevated, likely from recurrent infections and steroids.  Offered her to f/u with PCP or return to [place].  Requested patient call me back if she desires to follow up with Dr. Burr Medico in March.

## 2017-12-23 NOTE — Telephone Encounter (Signed)
-----   Message from Alla Feeling, NP sent at 12/21/2017  1:05 PM EDT ----- Please let her know labs results. BCR/ABL test is negative, no evidence of CML. Folic acid is normal. Iron studies are low range of normal, I recommend taking 1 tab daily. B12 is elevated, can take B12 once per week instead of daily. WBC is mildly elevated, likely from recurrent infections and steroids. She can f/u with PCP for this, or return to [place] if she prefers. If she would like to f/u here, please schedule lab and f/u with Dr. Burr Medico in 05/2017.  Thanks, Regan Rakers NP

## 2017-12-30 ENCOUNTER — Ambulatory Visit (INDEPENDENT_AMBULATORY_CARE_PROVIDER_SITE_OTHER): Payer: Medicare Other | Admitting: Internal Medicine

## 2017-12-30 ENCOUNTER — Encounter: Payer: Self-pay | Admitting: Internal Medicine

## 2017-12-30 DIAGNOSIS — M797 Fibromyalgia: Secondary | ICD-10-CM

## 2017-12-30 MED ORDER — MAGNESIUM OXIDE 400 MG PO TABS: 400.0000 mg | ORAL_TABLET | Freq: Every day | ORAL | 1 refills | Status: DC

## 2017-12-30 NOTE — Progress Notes (Signed)
   Subjective:    Patient ID: Kayla Thompson, female    DOB: 11-Apr-1935, 82 y.o.   MRN: [id number]  HPI The patient is an 82 YO female coming in for follow up of her muscle pains. She is still having significant muscle pain in her legs in the evening time. She is using the baclofen and this is helping some but not tons. She is wondering if there are other options. Denies injury or overuse. Still taking her prednisone daily for other reasons. Denies change in hydration or diet. Has tried tylenol without much relief.   Review of Systems  Constitutional: Positive for activity change. Negative for appetite change, chills, fatigue, fever and unexpected weight change.  Respiratory: Negative.   Cardiovascular: Negative.   Gastrointestinal: Negative.   Musculoskeletal: Positive for arthralgias, back pain and myalgias. Negative for gait problem and joint swelling.  Skin: Negative.   Neurological: Negative.       Objective:   Physical Exam  Constitutional: She is oriented to person, place, and time. She appears well-developed and well-nourished.  HENT:  Head: Normocephalic and atraumatic.  Eyes: EOM are normal.  Neck: Normal range of motion.  Cardiovascular: Normal rate and regular rhythm.  Pulmonary/Chest: Effort normal and breath sounds normal. No respiratory distress. She has no wheezes. She has no rales.  Abdominal: Soft.  Musculoskeletal: She exhibits tenderness. She exhibits no edema.  Neurological: She is alert and oriented to person, place, and time. Coordination normal.  Skin: Skin is warm and dry.   Vitals:   12/30/17 0854  BP: 130/80  Pulse: 77  Temp: 97.8 F (36.6 C)  TempSrc: Oral  SpO2: 95%  Weight: 154 lb (69.9 kg)  Height: 5' (1.524 m)      Assessment & Plan:

## 2017-12-30 NOTE — Assessment & Plan Note (Signed)
Will try adding magnesium daily to see if this helps with her symptoms as well as copper gloves to see if this can help her hand pain.

## 2017-12-30 NOTE — Patient Instructions (Signed)
We have sent in magnesium supplement to take daily to see if this helps prevent the muscle problems in the evening.  Think about getting some copper gloves to help with the hand pain. These can help arthritis pain.

## 2018-01-02 ENCOUNTER — Other Ambulatory Visit: Payer: Self-pay

## 2018-01-02 ENCOUNTER — Ambulatory Visit: Payer: Medicare Other | Attending: Internal Medicine | Admitting: Physical Therapy

## 2018-01-02 ENCOUNTER — Encounter: Payer: Self-pay | Admitting: Physical Therapy

## 2018-01-02 DIAGNOSIS — M797 Fibromyalgia: Secondary | ICD-10-CM | POA: Diagnosis not present

## 2018-01-02 DIAGNOSIS — M25512 Pain in left shoulder: Secondary | ICD-10-CM | POA: Insufficient documentation

## 2018-01-02 DIAGNOSIS — M25562 Pain in left knee: Secondary | ICD-10-CM | POA: Diagnosis not present

## 2018-01-02 DIAGNOSIS — M25612 Stiffness of left shoulder, not elsewhere classified: Secondary | ICD-10-CM

## 2018-01-02 DIAGNOSIS — R2681 Unsteadiness on feet: Secondary | ICD-10-CM

## 2018-01-02 DIAGNOSIS — G8929 Other chronic pain: Secondary | ICD-10-CM | POA: Diagnosis not present

## 2018-01-02 NOTE — Therapy (Addendum)
[facility] [address] [place], [place], [address] Phone: [phone]   Fax:  [phone]  Physical Therapy Evaluation  Patient Details  Name: Kayla Thompson MRN: [id number] Date of Birth: 1936/01/20 Referring Provider (PT): Pricilla Holm   Encounter Date: 01/02/2018  PT End of Session - 01/02/18 1153    Visit Number  1    Date for PT Re-Evaluation  03/27/18    Authorization Type  MCR    PT Start Time  1153    PT Stop Time  1231    PT Time Calculation (min)  38 min    Activity Tolerance  Patient tolerated treatment well    Behavior During Therapy  [facility] for tasks assessed/performed       Past Medical History:  Diagnosis Date  . Arthritis   . Asthma   . Diabetes mellitus without complication ([place])   . Fibromyalgia   . GERD (gastroesophageal reflux disease)   . History of chemotherapy   . History of fractured pelvis   . Hypertension   . Osteoporosis   . Osteoporosis   . Ovarian cancer [facility])     Past Surgical History:  Procedure Laterality Date  . ABDOMINAL HYSTERECTOMY  1996  . ANKLE FRACTURE SURGERY     plate and screws  . BLADDER SUSPENSION    . CATARACT EXTRACTION    . EYE SURGERY    . hip surgey    . knee surgey    . LUNG SURGERY    . OOPHORECTOMY     BSO    There were no vitals filed for this visit.   Subjective Assessment - 01/02/18 1159    Subjective  Patient reports she has fibromyalgia. She also has severe OA in left shoulder and left knee. She has pain all over and also reports balance issues.  She also reports spasms in legs at night.    Patient is accompained by:  --   [place] aide   Pertinent History  DM, OP, fibromyalgia, rib pain Left due to fall in January, HTN, R TKR, asthma, COPD, rod hin R femur, fractured R ankle    How long can you walk comfortably?  1/2 block    Patient Stated Goals  decrease pain, improve strenght and balance    Currently in Pain?  Yes    Pain Score   7     Pain Location  Shoulder    Pain Orientation  Left    Pain Type  Chronic pain    Pain Onset  More than a month ago    Pain Frequency  Constant    Aggravating Factors   moving arm away from side    Pain Relieving Factors  tylenol extra strength    Effect of Pain on Daily Activities  limited    Multiple Pain Sites  Yes    Pain Score  7    Pain Location  Knee    Pain Orientation  Left    Pain Type  Chronic pain    Pain Onset  More than a month ago    Pain Frequency  Constant    Aggravating Factors   walking, standing    Pain Relieving Factors  ice packs         [facility] PT Assessment - 01/02/18 0001      Assessment   Medical Diagnosis  fibromyalgia    Referring Provider (PT)  Pricilla Holm    Onset Date/Surgical Date  12/20/17  Hand Dominance  Right    Next MD Visit  3 weeks    Prior Therapy  HHPT 6 weeks      Precautions   Precautions  Fall    Precaution Comments  Fall Risk      Balance Screen   Has the patient fallen in the past 6 months  Yes    How many times?  1    Has the patient had a decrease in activity level because of a fear of falling?   Yes    Is the patient reluctant to leave their home because of a fear of falling?   No      Home Environment   Living Environment  Private residence    Living Arrangements  Alone    Type of [place] to enter    Entrance Stairs-Number of Steps  1    Entrance Stairs-Rails  None    Home Layout  One level    [place] - 2 wheels    Additional Comments  family lives nearby; part time aide also      Prior Function   Level of Independence  Independent with basic ADLs    Vocation  Retired      Art therapist   Posture/Postural Control  Postural limitations    Postural Limitations  Rounded Shoulders;Forward head    Posture Comments  elevated L UT       ROM / Strength   AROM / PROM / Strength  AROM;PROM;Strength      AROM   Overall AROM Comments  106 flexion L  knee, -3 extension    AROM Assessment Site  Shoulder    Right/Left Shoulder  Left      PROM   Overall PROM Comments  116 flex L knee; full passive ext    PROM Assessment Site  Shoulder    Right/Left Shoulder  Left    Left Shoulder ABduction  40 Degrees    Left Shoulder Internal Rotation  48 Degrees    Left Shoulder External Rotation  0 Degrees      Strength   Overall Strength Comments  BLE grossly 4+/5 except L hip flex 4-/5 and R knee flex 4/5; R shoulder grossly 4-/5, ER 3+/5      Flexibility   Soft Tissue Assessment /Muscle Length  yes    Hamstrings  mild bil    Piriformis  R tight      Palpation   Patella mobility  decreased Bil R>L    Palpation comment  difficulty to assess due to fibromyalgia      Balance   Balance Assessed  Yes      Standardized Balance Assessment   Standardized Balance Assessment  Five Times Sit to Stand;Timed Up and Go Test    Five times sit to stand comments   14 sec      Timed Up and Go Test   Normal TUG (seconds)  17   with rollator               Objective measurements completed on examination: See above findings.              PT Education - 01/02/18 1237    Education Details  HEP    Person(s) Educated  Patient    Methods  Explanation;Demonstration;Handout    Comprehension  Verbalized understanding;Returned demonstration       PT Short Term Goals - 01/02/18  [place] #1   Title  Ind with inital HEP     Time  6    Period  Weeks    Status  New    Target Date  02/13/18   due to delay in start of Rx     PT SHORT TERM GOAL #2   Title  All STG=LTG        PT Long Term Goals - 01/02/18 1305      PT LONG TERM GOAL #1   Title  Patient to report decreased pain overall by 30%.    Time  12    Period  Weeks    Status  New    Target Date  03/27/18   due to delay in start of treatment     PT LONG TERM GOAL #2   Title  Patient able to climb 4 steps (at son's house) safely with one hand rail  and SBA    Time  12    Period  Weeks    Status  New      PT LONG TERM GOAL #3   Title  Patient to improve 5x sit<>stand to 12 sec or less to decrease fall risk    Time  12    Period  Weeks    Status  New      PT LONG TERM GOAL #4   Title  Patient to improve TUG score to < 14 sec to decrease fall risk    Time  12    Period  Weeks    Status  New             Plan - 01/02/18 1237    Clinical Impression Statement  Patient presents today with complaints of all over pain due to fibromyalgia, but with main pain in her left shoulder, rib and knee. She also reports strength and balance deficits. She reports 2 falls, one iin January 2019 and another in June in her bedroom. She has marked limitations in left shoulder ROM with marked crepitus and pain both actively and passively. She also has mild limitations in left knee ROM. Her sit <>stand and TUG indicate she is a fall risk for her age.    History and Personal Factors relevant to plan of care:  DM, OP, fibromyalgia, rib pain Left due to fall in January, HTN, R TKR, asthma, COPD, rod hin R femur, fractured R ankle    Clinical Presentation  Evolving    Clinical Presentation due to:  falls    Clinical Decision Making  Moderate    Rehab Potential  Fair    Clinical Impairments Affecting Rehab Potential  severe OA in shoulder; fibromyalgia    PT Frequency  2x / week    PT Duration  8 weeks   Patient out of town until 01/29/18   PT Treatment/Interventions  ADLs/Self Care Home Management;Cryotherapy;Wellsite geologist;Therapeutic exercise;Therapeutic activities;Balance training;Neuromuscular re-education;Patient/family education;Passive range of motion;Manual techniques    PT Next Visit Plan  left shoulder isometrics/ROM; OP decompression TE;  functional LE strengthening; balance training; estim for pain    PT Home Exercise Plan  shoulder elevation and depression; (continue bridging, sit<>stand from HHPT)     Consulted and Agree with Plan of Care  Patient;Family member/caregiver       Patient will benefit from skilled therapeutic intervention in order to improve the following deficits and impairments:  Pain, Impaired UE functional use, Decreased activity tolerance, Decreased balance,  Decreased range of motion, Impaired flexibility, Postural dysfunction  Visit Diagnosis: Fibromyalgia - Plan: PT plan of care cert/re-cert  Chronic left shoulder pain - Plan: PT plan of care cert/re-cert  Stiffness of left shoulder, not elsewhere classified - Plan: PT plan of care cert/re-cert  Chronic pain of left knee - Plan: PT plan of care cert/re-cert  Unsteadiness on feet - Plan: PT plan of care cert/re-cert     Problem List Patient Active Problem List   Diagnosis Date Noted  . Leukocytosis 10/17/2017  . Malnutrition of moderate degree 10/10/2017  . Community acquired pneumonia of left lower lobe of lung ([place]) 10/09/2017  . Memory changes 07/29/2017  . Granulomatous lung disease ([place]) 06/14/2017  . Tracheobronchomalacia 06/14/2017  . Dizziness 03/04/2017  . Age-related osteoporosis without current pathological fracture 09/30/2016  . Exercise hypoxemia 07/12/2016  . Diastolic CHF, chronic ([place]) 12/25/2015  . GERD (gastroesophageal reflux disease) 12/25/2015  . Epidermal inclusion cyst 09/02/2015  . Type 2 diabetes mellitus with hyperglycemia, with long-term current use of insulin ([place]) 03/10/2015  . Bronchiectasis without acute exacerbation ([place]) 01/07/2015  . PMR (polymyalgia rheumatica) ([place]) 01/07/2015  . Osteoarthritis 01/07/2015  . Asthma, chronic 11/12/2014  . Anxiety state 11/12/2014  . Hyperparathyroidism ([place]) 10/28/2014  . Fibromyalgia 09/21/2014  . Personal history of ovarian cancer 08/23/2014  . Osteoporosis 08/23/2014  . Hypertension 08/02/2014  . HLD (hyperlipidemia) 08/02/2014  . Hypothyroidism 08/02/2014    Durk Carmen PT 01/02/2018, 1:21 PM  [facility] [address], [place], [place], [address] Phone: [phone]   Fax:  [phone]  Name: Kayla Thompson MRN: [id number] Date of Birth: 09-08-1935   PHYSICAL THERAPY DISCHARGE SUMMARY  Visits from Start of Care: 1 Plan: Patient agrees to discharge.  Patient goals were not met. Patient is being discharged due to not returning since the last visit.  ?????     Pt did not return to PT.   Lyndee Hensen, PT, DPT 2:47 PM  09/04/18

## 2018-01-02 NOTE — Patient Instructions (Signed)
Elevation: Shrug (No resistance)    Lift shoulders straight up, then push shoulders downward. Maintain same speed up and down. Avoid moving head and neck forward. Repeat _10___ times per set. Do _2__ sets per session. Do __5 __ sessions per week. Use ____ lb weights.  Copyright  VHI. All rights reserved.   Madelyn Flavors, PT 01/02/18 12:28 PM [facility] [address], [place], [place] [address] Phone # [phone] Fax [phone]

## 2018-01-03 ENCOUNTER — Ambulatory Visit: Payer: Self-pay

## 2018-01-03 NOTE — Telephone Encounter (Signed)
Returned call to patient who states she notice some bright red blood while washing today at her anus. Pt states that she has had some diarrhea that she stopped with imodium within the last 24 hours. She has not noticed blood since washing. She does have Hx of hemorrhoids, but no complaints or symptoms beyond the wipe that she did this am.   Appointment made with PCP per protocol.  Care advice read to patient. Pt verbalized understanding of all instructions.  Reason for Disposition . [1] Rectal bleeding is minimal (e.g., blood just on toilet paper, few drops, streaks on surface of normal formed BM) AND [2] bleeding recurs 3 or more times on treatment  Answer Assessment - Initial Assessment Questions 1. APPEARANCE of BLOOD: "What color is it?" "Is it passed separately, on the surface of the stool, or mixed in with the stool?"      Bright red 2. AMOUNT: "How much blood was passed?"      When she wipes 3. FREQUENCY: "How many times has blood been passed with the stools?"      When getting up today and washing 4. ONSET: "When was the blood first seen in the stools?" (Days or weeks)      today 5. DIARRHEA: "Is there also some diarrhea?" If so, ask: "How many diarrhea stools were passed in past 24 hours?"      Yes took imodium 6. CONSTIPATION: "Do you have constipation?" If so, "How bad is it?"     no 7. RECURRENT SYMPTOMS: "Have you had blood in your stools before?" If so, ask: "When was the last time?" and "What happened that time?"      Years ago 8. BLOOD THINNERS: "Do you take any blood thinners?" (e.g., Coumadin/warfarin, Pradaxa/dabigatran, aspirin)     no 9. OTHER SYMPTOMS: "Do you have any other symptoms?"  (e.g., abdominal pain, vomiting, dizziness, fever)     no 10. PREGNANCY: "Is there any chance you are pregnant?" "When was your last menstrual period?"       N/A  Protocols used: RECTAL BLEEDING-A-AH

## 2018-01-05 ENCOUNTER — Ambulatory Visit: Payer: Medicare Other | Admitting: Internal Medicine

## 2018-01-05 ENCOUNTER — Ambulatory Visit
Admission: RE | Admit: 2018-01-05 | Discharge: 2018-01-05 | Disposition: A | Discharge status: Home / Self Care | Payer: Medicare Other | Source: Ambulatory Visit | Attending: Gynecology | Admitting: Gynecology

## 2018-01-05 DIAGNOSIS — M810 Age-related osteoporosis without current pathological fracture: Secondary | ICD-10-CM | POA: Diagnosis not present

## 2018-01-05 DIAGNOSIS — Z1231 Encounter for screening mammogram for malignant neoplasm of breast: Secondary | ICD-10-CM

## 2018-01-05 DIAGNOSIS — Z780 Asymptomatic menopausal state: Secondary | ICD-10-CM | POA: Diagnosis not present

## 2018-01-05 DIAGNOSIS — M818 Other osteoporosis without current pathological fracture: Secondary | ICD-10-CM

## 2018-01-07 ENCOUNTER — Other Ambulatory Visit: Payer: Self-pay | Admitting: Internal Medicine

## 2018-01-08 ENCOUNTER — Other Ambulatory Visit: Payer: Self-pay | Admitting: Internal Medicine

## 2018-01-09 NOTE — Telephone Encounter (Signed)
Noted  

## 2018-01-09 NOTE — Telephone Encounter (Signed)
Please advise 

## 2018-01-09 NOTE — Telephone Encounter (Signed)
This patient was lost for follow-up.  Refills per PCP.

## 2018-01-10 ENCOUNTER — Other Ambulatory Visit: Payer: Self-pay | Admitting: Internal Medicine

## 2018-01-12 DIAGNOSIS — R350 Frequency of micturition: Secondary | ICD-10-CM | POA: Diagnosis not present

## 2018-01-12 DIAGNOSIS — R32 Unspecified urinary incontinence: Secondary | ICD-10-CM | POA: Diagnosis not present

## 2018-01-12 DIAGNOSIS — R351 Nocturia: Secondary | ICD-10-CM | POA: Diagnosis not present

## 2018-01-12 DIAGNOSIS — R3121 Asymptomatic microscopic hematuria: Secondary | ICD-10-CM | POA: Diagnosis not present

## 2018-01-18 ENCOUNTER — Ambulatory Visit: Payer: Medicare Other | Admitting: Pulmonary Disease

## 2018-01-24 ENCOUNTER — Other Ambulatory Visit: Payer: Self-pay | Admitting: Internal Medicine

## 2018-01-26 ENCOUNTER — Other Ambulatory Visit: Payer: Self-pay | Admitting: Internal Medicine

## 2018-01-27 NOTE — Telephone Encounter (Signed)
Should be short term only so no 90 day script

## 2018-01-31 ENCOUNTER — Ambulatory Visit: Payer: Medicare Other

## 2018-01-31 ENCOUNTER — Other Ambulatory Visit: Payer: Self-pay | Admitting: Internal Medicine

## 2018-02-03 ENCOUNTER — Other Ambulatory Visit (INDEPENDENT_AMBULATORY_CARE_PROVIDER_SITE_OTHER): Payer: Medicare Other

## 2018-02-03 ENCOUNTER — Encounter: Payer: Self-pay | Admitting: Internal Medicine

## 2018-02-03 ENCOUNTER — Ambulatory Visit (INDEPENDENT_AMBULATORY_CARE_PROVIDER_SITE_OTHER): Payer: Medicare Other | Admitting: Internal Medicine

## 2018-02-03 ENCOUNTER — Telehealth: Payer: Self-pay

## 2018-02-03 VITALS — BP 130/80 | HR 89 | Temp 97.8°F | Ht 60.0 in | Wt 153.0 lb

## 2018-02-03 DIAGNOSIS — R252 Cramp and spasm: Secondary | ICD-10-CM | POA: Diagnosis not present

## 2018-02-03 LAB — COMPREHENSIVE METABOLIC PANEL
ALT: 20 U/L (ref 0–35)
AST: 18 U/L (ref 0–37)
Albumin: 4.1 g/dL (ref 3.5–5.2)
Alkaline Phosphatase: 31 U/L — ABNORMAL LOW (ref 39–117)
BUN: 22 mg/dL (ref 6–23)
CO2: 30 mEq/L (ref 19–32)
Calcium: 9.9 mg/dL (ref 8.4–10.5)
Chloride: 102 mEq/L (ref 96–112)
Creatinine, Ser: 1.19 mg/dL (ref 0.40–1.20)
GFR: 46.31 mL/min — ABNORMAL LOW (ref >60.00)
Glucose, Bld: 65 mg/dL — ABNORMAL LOW (ref 70–99)
Potassium: 3.9 mEq/L (ref 3.5–5.1)
Sodium: 141 mEq/L (ref 135–145)
Total Bilirubin: 0.4 mg/dL (ref 0.2–1.2)
Total Protein: 6.8 g/dL (ref 6.0–8.3)

## 2018-02-03 LAB — CK: Total CK: 59 U/L (ref 7–177)

## 2018-02-03 LAB — MAGNESIUM: Magnesium: 2.2 mg/dL (ref 1.5–2.5)

## 2018-02-03 MED ORDER — TRIAMCINOLONE ACETONIDE 0.1 % EX CREA: 1.0000 "application " | TOPICAL_CREAM | Freq: Two times a day (BID) | CUTANEOUS | 0 refills | Status: DC

## 2018-02-03 NOTE — Progress Notes (Signed)
   Subjective:    Patient ID: Kayla Thompson, female    DOB: 1936/02/18, 82 y.o.   MRN: [id number]  HPI The patient is an 82 YO female coming in for ongoing muscle cramps. She has been having night time cramps for some time. She has been having worsening symptoms in the last 2 days and slept only 1 hour in that time due to pain. She denies change in diet, exercise, fluids. She drinks about 4 glasses of fluids daily. Denies swelling or rash on her legs. They are sore today. She is taking magnesium daily for about 4-5 days now. She has not tried anything else for it. Did wear her shoes to bed and this seemed to help a little last night.   Review of Systems  Constitutional: Negative.   HENT: Negative.   Eyes: Negative.   Respiratory: Negative for cough, chest tightness and shortness of breath.   Cardiovascular: Negative for chest pain, palpitations and leg swelling.  Gastrointestinal: Negative for abdominal distention, abdominal pain, constipation, diarrhea, nausea and vomiting.  Musculoskeletal: Positive for arthralgias, back pain, gait problem and myalgias.  Skin: Negative.   Neurological: Negative for syncope, light-headedness, numbness and headaches.  Psychiatric/Behavioral: Negative.       Objective:   Physical Exam  Constitutional: She is oriented to person, place, and time. She appears well-developed and well-nourished.  HENT:  Head: Normocephalic and atraumatic.  Eyes: EOM are normal.  Neck: Normal range of motion.  Cardiovascular: Normal rate and regular rhythm.  Pulmonary/Chest: Effort normal and breath sounds normal. No respiratory distress. She has no wheezes. She has no rales.  Musculoskeletal: She exhibits tenderness. She exhibits no edema.  No edema in either leg, diffuse pain in the legs below the knee not isolated to calves, no rash.   Neurological: She is alert and oriented to person, place, and time. Coordination normal.  Skin: Skin is warm and dry.   Vitals:   02/03/18 0930  BP: 130/80  Pulse: 89  Temp: 97.8 F (36.6 C)  TempSrc: Oral  SpO2: 96%  Weight: 153 lb (69.4 kg)  Height: 5' (1.524 m)      Assessment & Plan:

## 2018-02-03 NOTE — Patient Instructions (Signed)
We will check the labs today.   It is okay to use the baclofen in the evening for the muscle cramps to see if this helps.

## 2018-02-03 NOTE — Assessment & Plan Note (Signed)
Checking CK, CMP, magnesium level to rule out electrolyte abnormality and muscle breakdown. She is on chronic prednisone making steroid myopathy a possibility. She is advised to use baclofen which she is prescribed for these muscle pains to see if this helps. Given her multiple medications this is the best treatment approach. She can also try tylenol if needed. Continue magnesium.

## 2018-02-03 NOTE — Telephone Encounter (Signed)
She can if needed for swelling.

## 2018-02-03 NOTE — Telephone Encounter (Signed)
Copied from [place] [phone]. Topic: General - Other >> Feb 03, 2018  4:21 PM Oneta Rack wrote: Relation to pt: self  Call back number: [phone]    Reason for call:  Patient was seen today for Muscle cramps  by Dr. Sharlet Salina and would like to know if she should be wearing her compression stockings, please advise

## 2018-02-03 NOTE — Telephone Encounter (Signed)
Patient informed of MD response  

## 2018-02-06 DIAGNOSIS — Z961 Presence of intraocular lens: Secondary | ICD-10-CM | POA: Diagnosis not present

## 2018-02-20 ENCOUNTER — Ambulatory Visit: Payer: Medicare Other | Admitting: Pulmonary Disease

## 2018-02-24 ENCOUNTER — Ambulatory Visit: Payer: Medicare Other | Admitting: Internal Medicine

## 2018-03-17 ENCOUNTER — Telehealth: Payer: Self-pay | Admitting: Internal Medicine

## 2018-03-17 NOTE — Telephone Encounter (Signed)
Tried calling patient but kept disconnecting routing to [place] she calls back

## 2018-03-17 NOTE — Telephone Encounter (Signed)
I would not recommend anxiety meds on top of her other medications currently. Would recommend some counseling/therapy. Also can use deep breathing techniques or meditation or journaling to help with anxiety. Or can make visit to discuss.

## 2018-03-17 NOTE — Telephone Encounter (Signed)
Copied from [place] [id number]. Topic: Quick Communication - See Telephone Encounter >> Mar 17, 2018 12:58 PM Burchel, Abbi R wrote: CRM for notification. See Telephone encounter for: 03/17/18.  Pt states she has some recent family issues/stress going on and is requesting something to help ease her anxiety.  She states she thinks she has had an anxiety medication before from Dr Sharlet Salina. Please advise.    Pt: [phone]

## 2018-03-17 NOTE — Telephone Encounter (Signed)
Pt. returned call.  Informed of Dr. Nathanial Millman response to request for anxiety medication. Reported she does not feel, in her situation, that deep breathing, meditation, or journaling will help.  Appt. scheduled to discuss this further on 03/23/18 @ 2:40 PM.  Agrees with plan.

## 2018-03-20 ENCOUNTER — Ambulatory Visit (INDEPENDENT_AMBULATORY_CARE_PROVIDER_SITE_OTHER): Payer: Medicare Other | Admitting: Nurse Practitioner

## 2018-03-20 ENCOUNTER — Encounter: Payer: Self-pay | Admitting: Nurse Practitioner

## 2018-03-20 VITALS — BP 132/86 | HR 83 | Ht 60.0 in | Wt 151.6 lb

## 2018-03-20 DIAGNOSIS — J471 Bronchiectasis with (acute) exacerbation: Secondary | ICD-10-CM | POA: Diagnosis not present

## 2018-03-20 MED ORDER — DOXYCYCLINE HYCLATE 100 MG PO TABS: 100.0000 mg | ORAL_TABLET | Freq: Two times a day (BID) | ORAL | 0 refills | Status: DC

## 2018-03-20 MED ORDER — METHYLPREDNISOLONE 4 MG PO TBPK: ORAL_TABLET | ORAL | 0 refills | Status: DC

## 2018-03-20 NOTE — Assessment & Plan Note (Signed)
Patient Instructions  Will order medrol Dosepak Will order doxycycline Continue current medications Follow up to establish care with Dr. Valeta Harms at his first available or sooner if needed

## 2018-03-20 NOTE — Progress Notes (Signed)
@Patient  ID: Kayla Thompson, female    DOB: 1936-02-12, 82 y.o.   MRN: [id number]  Chief Complaint  Patient presents with  . 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 Valeta Harms.  Tests:  01/29/2013-spirometry- ratio 58, FEV1 75 concavity and flow volume loops 09/26/2012-pulmonary function test- ratio 58, FEV1 79, no significant bronchodilator response, DLCO 57, concavity and flow volume loops  Imaging:  10/09/2017-chest x-ray-left lower lobe nodular bronchovascular opacity suspicious for pneumonia  Cardiac:  08/02/2014-echocardiogram-LV ejection fraction 55 to 85%, grade 1 diastolic dysfunction  OV 46/27/03 - chest tightness/congestion Presents today with chest tightness and congestion.  She also complains of wheezing.  States that symptoms started 3 weeks ago.  She also complains of some sinus congestion.  States that symptoms have progressively worsened.  Is compliant with medications.  She is currently on Spiriva and Advair.  She has Proventil as needed.  Patient has oxygen at home and uses 2 L nasal cannula as needed.  Denies any fever, chest pain, or edema.   Allergies  Allergen Reactions  . Latex Rash  . Penicillins Rash    Has patient had a PCN reaction causing immediate rash, facial/tongue/throat swelling, SOB or lightheadedness with hypotension: No Has patient had a PCN reaction causing severe rash involving mucus membranes or skin necrosis: No Has patient had a PCN reaction that required hospitalization: No Has patient had a PCN reaction occurring within the last 10 years: No If all of the above answers are "NO", then may proceed with Cephalosporin use.     Immunization History  Administered Date(s) Administered  . Influenza, High Dose Seasonal PF 12/01/2016,  11/29/2017  . Influenza,inj,Quad PF,6+ Mos 01/17/2015, 12/18/2015  . Pneumococcal Polysaccharide-23 12/18/2015  . Tdap 08/02/2014, 04/07/2017    Past Medical History:  Diagnosis Date  . Arthritis   . Asthma   . Diabetes mellitus without complication ([place])   . Fibromyalgia   . GERD (gastroesophageal reflux disease)   . History of chemotherapy   . History of fractured pelvis   . Hypertension   . Osteoporosis 12/2017   T score -2.8 stable from prior DEXA  . Osteoporosis   . Ovarian cancer ([place])     Tobacco History: Social History   Tobacco Use  Smoking Status Never Smoker  Smokeless Tobacco Never Used   Counseling given: Yes   Outpatient Encounter Medications as of 03/20/2018  Medication Sig  . ADVAIR DISKUS 500-50 MCG/DOSE AEPB INHALE 1 PUFF BY MOUTH TWICE A DAY  . albuterol (PROVENTIL HFA;VENTOLIN HFA) 108 (90 Base) MCG/ACT inhaler Inhale 2 puffs into the lungs every 6 (six) hours as needed for wheezing or shortness of breath.  . Ascorbic Acid (VITAMIN C) 1000 MG tablet Take 1,000 mg by mouth daily.  . baclofen (LIORESAL) 10 MG tablet TAKE 1/2 TABLETS BY MOUTH 2 TIMES DAILY AS NEEDED FOR MUSCLE SPASMS/PAIN  . BD PEN NEEDLE NANO U/F 32G X 4 MM MISC USE 4 TIMES A DAY  . calcium carbonate (OSCAL) 1500 (600 Ca) MG TABS tablet Take 1,500 mg by mouth 2 (two) times daily with a meal.  . carvedilol (COREG) 3.125 MG tablet 2 tabs daily  . Cholecalciferol (VITAMIN D3) 2000 units TABS Take 2 tablets by mouth daily.   . cycloSPORINE (RESTASIS) 0.05 % ophthalmic emulsion  Place 1 drop into both eyes 2 (two) times daily.  Marland Kitchen denosumab (PROLIA) 60 MG/ML SOLN injection Inject 60 mg into the skin every 6 (six) months. Administer in upper arm, thigh, or abdomen  . Dextromethorphan-Guaifenesin (MUCINEX DM MAXIMUM STRENGTH) 60-1200 MG TB12 Take 1 tablet by mouth 2 (two) times daily. Reported on 06/23/2015  . donepezil (ARICEPT) 5 MG tablet TAKE 1 TABLET (5 MG TOTAL) BY MOUTH AT BEDTIME.  .  DULoxetine (CYMBALTA) 30 MG capsule Take 1 capsule (30 mg total) by mouth daily.  . DULoxetine (CYMBALTA) 60 MG capsule Take 1 capsule (60 mg total) by mouth daily.  . fluticasone (FLONASE) 50 MCG/ACT nasal spray Place 2 sprays into both nostrils daily.  . Fluticasone-Salmeterol (ADVAIR) 500-50 MCG/DOSE AEPB Inhale 1 puff into the lungs 2 (two) times daily.  Marland Kitchen glucose blood (FREESTYLE TEST STRIPS) test strip Use to test blood sugar 3 times daily. Dx: E11.65  . insulin glargine (LANTUS) 100 UNIT/ML injection Inject 20 Units into the skin every morning.  . insulin lispro (HUMALOG KWIKPEN) 100 UNIT/ML KiwkPen Inject 4-6 units into the skin daily. (Patient taking differently: Inject 4 Units into the skin daily. )  . Lancets (FREESTYLE) lancets Use to test blood sugar 3 times daily. Dx: E11.65  . levothyroxine (SYNTHROID, LEVOTHROID) 50 MCG tablet Take 1 tablet (50 mcg total) by mouth daily before breakfast.  . magnesium oxide (MAG-OX) 400 MG tablet Take 1 tablet (400 mg total) by mouth daily.  . Melatonin 5 MG TABS Take 5 mg by mouth at bedtime.  . montelukast (SINGULAIR) 10 MG tablet TAKE 1 TABLET (10 MG TOTAL) BY MOUTH DAILY.  . Multiple Vitamins-Minerals (CENTRUM ADULTS PO) Take by mouth.  . Omega-3 Fatty Acids (FISH OIL) 1000 MG CAPS Take by mouth.  Marland Kitchen omeprazole (PRILOSEC) 20 MG capsule Take 1 capsule (20 mg total) by mouth daily.  Vladimir Faster Glycol-Propyl Glycol (SYSTANE FREE OP) Apply 1 drop to eye 3 (three) times daily.  . predniSONE (DELTASONE) 1 MG tablet Take 2 mg by mouth daily.   . rosuvastatin (CRESTOR) 10 MG tablet TAKE 1 TABLET (10 MG TOTAL) BY MOUTH EVERY EVENING.  . SPIRIVA HANDIHALER 18 MCG inhalation capsule PLACE 1 CAPSULE INTO INHALER AND INHALE AS DIRECTED EVERY DAY  . triamcinolone cream (KENALOG) 0.1 % Apply 1 application topically 2 (two) times daily.  Marland Kitchen trimethoprim (TRIMPEX) 100 MG tablet Take 100 mg by mouth daily.  . vitamin B-12 (CYANOCOBALAMIN) 250 MCG tablet Take 250  mcg by mouth daily.  Marland Kitchen zolpidem (AMBIEN) 5 MG tablet Take 5 mg by mouth at bedtime.  Marland Kitchen doxycycline (VIBRA-TABS) 100 MG tablet Take 1 tablet (100 mg total) by mouth 2 (two) times daily.  . methylPREDNISolone (MEDROL DOSEPAK) 4 MG TBPK tablet Take as directed.   No facility-administered encounter medications on file as of 03/20/2018.      Review of Systems  Review of Systems  Constitutional: Negative.  Negative for activity change and fever.  HENT: Negative.   Respiratory: Positive for cough, shortness of breath and wheezing.   Cardiovascular: Negative.  Negative for chest pain, palpitations and leg swelling.  Gastrointestinal: Negative.   Allergic/Immunologic: Negative.   Neurological: Negative.   Psychiatric/Behavioral: Negative.        Physical Exam  BP 132/86 (BP Location: Right Arm, Patient Position: Sitting, Cuff Size: Normal)   Pulse 83   Ht 5' (1.524 m)   Wt 151 lb 9.6 oz (68.8 kg)   SpO2 99%   BMI 29.61 kg/m  Wt Readings from Last 5 Encounters:  03/20/18 151 lb 9.6 oz (68.8 kg)  02/03/18 153 lb (69.4 kg)  12/30/17 154 lb (69.9 kg)  12/06/17 148 lb (67.1 kg)  11/29/17 148 lb (67.1 kg)     Physical Exam Vitals signs and nursing note reviewed.  Constitutional:      General: She is not in acute distress.    Appearance: She is well-developed.  Cardiovascular:     Rate and Rhythm: Normal rate and regular rhythm.  Pulmonary:     Effort: Pulmonary effort is normal.     Breath sounds: Wheezing present.  Musculoskeletal:        General: No swelling.  Neurological:     Mental Status: She is alert and oriented to person, place, and time.       Assessment & Plan:   Bronchiectasis with acute exacerbation [facility] [place]) Patient Instructions  Will order medrol Dosepak Will order doxycycline Continue current medications Follow up to establish care with Dr. Valeta Harms at his first available or sooner if needed       Fenton Foy, NP 03/20/2018

## 2018-03-20 NOTE — Patient Instructions (Addendum)
Will order medrol Dosepak Will order doxycycline Continue current medications Follow up to establish care with Dr. Valeta Harms at his first available or sooner if needed

## 2018-03-23 ENCOUNTER — Ambulatory Visit (INDEPENDENT_AMBULATORY_CARE_PROVIDER_SITE_OTHER): Payer: Medicare Other | Admitting: Internal Medicine

## 2018-03-23 ENCOUNTER — Encounter: Payer: Self-pay | Admitting: Internal Medicine

## 2018-03-23 DIAGNOSIS — F411 Generalized anxiety disorder: Secondary | ICD-10-CM

## 2018-03-23 DIAGNOSIS — M67912 Unspecified disorder of synovium and tendon, left shoulder: Secondary | ICD-10-CM | POA: Diagnosis not present

## 2018-03-23 DIAGNOSIS — M1712 Unilateral primary osteoarthritis, left knee: Secondary | ICD-10-CM | POA: Diagnosis not present

## 2018-03-23 DIAGNOSIS — M19012 Primary osteoarthritis, left shoulder: Secondary | ICD-10-CM | POA: Diagnosis not present

## 2018-03-23 MED ORDER — BUSPIRONE HCL 5 MG PO TABS: 5.0000 mg | ORAL_TABLET | Freq: Two times a day (BID) | ORAL | 0 refills | Status: DC

## 2018-03-23 NOTE — Patient Instructions (Signed)
We have sent in buspar to try for anxiety 5 mg. You can take 1-2 tablets twice a day.   Get back into counseling as this can really help to work on coping skills for anxiety.   Work on doing some deep breathing exercises to help with stress and anxiety.

## 2018-03-23 NOTE — Progress Notes (Signed)
   Subjective:   Patient ID: Kayla Thompson, female    DOB: 12-Mar-1936, 83 y.o.   MRN: [id number]  HPI The patient is an 83 YO female coming in for anxiety. Life has been very busy lately and she is wanting something for anxiety. She denies taking ambien recently and this is removed from her list. Taking melatonin for sleep. Denies SI/HI but feeling anxious all the time. Denies full blown panic attacks often. Is not in counseling currently but has been in the past. Playing games on her phone helps to distract her. She has not tried our suggestions of deep breathing, meditation, journaling as she feels she does not have enough time.   Review of Systems  Constitutional: Negative.   HENT: Negative.   Eyes: Negative.   Respiratory: Negative for cough, chest tightness and shortness of breath.   Cardiovascular: Negative for chest pain, palpitations and leg swelling.  Gastrointestinal: Negative for abdominal distention, abdominal pain, constipation, diarrhea, nausea and vomiting.  Musculoskeletal: Negative.   Skin: Negative.   Neurological: Negative.   Psychiatric/Behavioral: Positive for decreased concentration and sleep disturbance. Negative for behavioral problems, confusion, self-injury and suicidal ideas. The patient is nervous/anxious.     Objective:  Physical Exam Constitutional:      Appearance: She is well-developed.  HENT:     Head: Normocephalic and atraumatic.  Neck:     Musculoskeletal: Normal range of motion.  Cardiovascular:     Rate and Rhythm: Normal rate and regular rhythm.  Pulmonary:     Effort: Pulmonary effort is normal. No respiratory distress.     Breath sounds: Normal breath sounds. No wheezing or rales.  Abdominal:     General: Bowel sounds are normal. There is no distension.     Palpations: Abdomen is soft.     Tenderness: There is no abdominal tenderness. There is no rebound.  Skin:    General: Skin is warm and dry.  Neurological:     Mental Status: She is  alert and oriented to person, place, and time.     Coordination: Coordination normal.     Vitals:   03/23/18 1435  BP: 120/90  Pulse: 76  Temp: 98.4 F (36.9 C)  TempSrc: Oral  SpO2: 95%  Weight: 150 lb (68 kg)  Height: 5' (1.524 m)    Assessment & Plan:  Visit time 25 minutes: greater than 50% of that time was spent in face to face counseling and coordination of care with the patient: counseled about various non-medication ways to reduce stress including counseling, meditation, deep breathing. We talked about the benefits and given her memory problems she cannot take any benzodiazepines.

## 2018-03-24 ENCOUNTER — Encounter: Payer: Self-pay | Admitting: Internal Medicine

## 2018-03-24 NOTE — Progress Notes (Signed)
PCCM: Patient of Dr. Lenna Gilford. Will be glad to establish care at next appointment. Garner Nash, DO [facility] 03/24/2018 8:21 PM

## 2018-03-24 NOTE — Assessment & Plan Note (Signed)
She is taking cymbalta still. Rx for buspar for anxiety and we discussed that this is only medication option for her stress. We talked about several non-medication options including counseling, journaling, deep breathing techniques, meditation.

## 2018-03-30 ENCOUNTER — Encounter: Payer: Self-pay | Admitting: Pulmonary Disease

## 2018-03-30 ENCOUNTER — Ambulatory Visit (INDEPENDENT_AMBULATORY_CARE_PROVIDER_SITE_OTHER): Payer: Medicare Other | Admitting: Pulmonary Disease

## 2018-03-30 VITALS — BP 128/82 | HR 79 | Ht 60.0 in | Wt 150.0 lb

## 2018-03-30 DIAGNOSIS — J4530 Mild persistent asthma, uncomplicated: Secondary | ICD-10-CM | POA: Diagnosis not present

## 2018-03-30 DIAGNOSIS — R059 Cough, unspecified: Secondary | ICD-10-CM

## 2018-03-30 DIAGNOSIS — J479 Bronchiectasis, uncomplicated: Secondary | ICD-10-CM

## 2018-03-30 DIAGNOSIS — R0602 Shortness of breath: Secondary | ICD-10-CM | POA: Diagnosis not present

## 2018-03-30 DIAGNOSIS — R05 Cough: Secondary | ICD-10-CM | POA: Diagnosis not present

## 2018-03-30 DIAGNOSIS — Z7952 Long term (current) use of systemic steroids: Secondary | ICD-10-CM

## 2018-03-30 NOTE — Progress Notes (Signed)
Synopsis: Referred in Jan 2020 for shortness of breath, bronchiectasis, asthma by [name], *  Subjective:   PATIENT ID: Kayla Thompson GENDER: female DOB: October 12, 1935, MRN: [id number]  Chief Complaint  Patient presents with  . Follow-up    States her breathing has been good, no worse than her baseline. States she was an antibiotic and steriod at last visit but did not take it because she did not know what she ws taking it for.     PMH of asthma dx 20+ years ago, takes advair 500/50 and as needed albuterol. Started on chronic prednisone 2mg  every day for the past 20 years. Started by Dr. Lollie Marrow in [place].  She has been followed by Dr. Lenna Gilford for ongoing shortness of breath and her asthma.  She is currently managed with Advair and Spiriva.  Prior pulmonary function tests with no evidence of obstruction.  She does however have cylindric and varicoid bronchiectasis on CT imaging.  Her last axial CT imaging was in 2016.  She did have a episode of pneumonia in July 2019.  She was treated with antibiotics during this time and had a follow-up chest imaging with an x-ray in August 2019 which showed resolution.  She was seen by 1 of our nurse practitioners, Lazaro Arms a few weeks ago.  She was treated for a bronchiectasis exacerbation as she complained of chest tightness wheezing and congestion.  She got antibiotics and steroids to include Doxy and Medrol Dosepak but never took any of the medications.  She said she started to feel better after she left the office.  Patient denies any fevers, chills, night sweats, change in sputum production.  Denies hemoptysis.   Past Medical History:  Diagnosis Date  . Arthritis   . Asthma   . Diabetes mellitus without complication ([place])   . Fibromyalgia   . GERD (gastroesophageal reflux disease)   . History of chemotherapy   . History of fractured pelvis   . Hypertension   . Osteoporosis 12/2017   T score -2.8 stable from prior DEXA  .  Osteoporosis   . Ovarian cancer ([place])      Family History  Problem Relation Age of Onset  . Lung cancer Mother   . CAD Father   . Diabetes Father   . Lung cancer Maternal Aunt   . Diabetes Paternal Aunt   . Diabetes Paternal Uncle   . Diabetes Paternal Grandmother   . Diabetes Paternal Grandfather      Past Surgical History:  Procedure Laterality Date  . ABDOMINAL HYSTERECTOMY  1996  . ANKLE FRACTURE SURGERY     plate and screws  . BLADDER SUSPENSION    . CATARACT EXTRACTION    . EYE SURGERY    . hip surgey    . knee surgey    . LUNG SURGERY    . OOPHORECTOMY     BSO    Social History   Socioeconomic History  . Marital status: Widowed    Spouse name: Not on file  . Number of children: 2  . Years of education: Not on file  . Highest education level: Not on file  Occupational History  . Not on file  Social Needs  . Financial resource strain: Not hard at all  . Food insecurity:    Worry: Never true    Inability: Never true  . Transportation needs:    Medical: No    Non-medical: No  Tobacco Use  . Smoking status: Never Smoker  .  Smokeless tobacco: Never Used  Substance and Sexual Activity  . Alcohol use: No    Alcohol/week: 0.0 standard drinks  . Drug use: No  . Sexual activity: Not Currently    Comment: declined insuracne questions  Lifestyle  . Physical activity:    Days per week: 0 days    Minutes per session: 0 min  . Stress: Not at all  Relationships  . Social connections:    Talks on phone: More than three times a week    Gets together: More than three times a week    Attends religious service: 1 to 4 times per year    Active member of club or organization: Yes    Attends meetings of clubs or organizations: 1 to 4 times per year    Relationship status: Widowed  . Intimate partner violence:    Fear of current or ex partner: Not on file    Emotionally abused: Not on file    Physically abused: Not on file    Forced sexual activity: Not on file   Other Topics Concern  . Not on file  Social History Narrative  . Not on file     Allergies  Allergen Reactions  . Latex Rash  . Penicillins Rash    Has patient had a PCN reaction causing immediate rash, facial/tongue/throat swelling, SOB or lightheadedness with hypotension: No Has patient had a PCN reaction causing severe rash involving mucus membranes or skin necrosis: No Has patient had a PCN reaction that required hospitalization: No Has patient had a PCN reaction occurring within the last 10 years: No If all of the above answers are "NO", then may proceed with Cephalosporin use.      Outpatient Medications Prior to Visit  Medication Sig Dispense Refill  . ADVAIR DISKUS 500-50 MCG/DOSE AEPB INHALE 1 PUFF BY MOUTH TWICE A DAY 180 each 2  . albuterol (PROVENTIL HFA;VENTOLIN HFA) 108 (90 Base) MCG/ACT inhaler Inhale 2 puffs into the lungs every 6 (six) hours as needed for wheezing or shortness of breath. 1 Inhaler 11  . Ascorbic Acid (VITAMIN C) 1000 MG tablet Take 1,000 mg by mouth daily.    . baclofen (LIORESAL) 10 MG tablet TAKE 1/2 TABLETS BY MOUTH 2 TIMES DAILY AS NEEDED FOR MUSCLE SPASMS/PAIN 30 tablet 0  . BD PEN NEEDLE NANO U/F 32G X 4 MM MISC USE 4 TIMES A DAY 400 each 1  . busPIRone (BUSPAR) 5 MG tablet Take 1-2 tablets (5-10 mg total) by mouth 2 (two) times daily. 120 tablet 0  . calcium carbonate (OSCAL) 1500 (600 Ca) MG TABS tablet Take 1,500 mg by mouth 2 (two) times daily with a meal.    . carvedilol (COREG) 3.125 MG tablet 2 tabs daily    . Cholecalciferol (VITAMIN D3) 2000 units TABS Take 2 tablets by mouth daily.     . cycloSPORINE (RESTASIS) 0.05 % ophthalmic emulsion Place 1 drop into both eyes 2 (two) times daily.    Marland Kitchen denosumab (PROLIA) 60 MG/ML SOLN injection Inject 60 mg into the skin every 6 (six) months. Administer in upper arm, thigh, or abdomen    . Dextromethorphan-Guaifenesin (MUCINEX DM MAXIMUM STRENGTH) 60-1200 MG TB12 Take 1 tablet by mouth 2 (two)  times daily. Reported on 06/23/2015    . donepezil (ARICEPT) 5 MG tablet TAKE 1 TABLET (5 MG TOTAL) BY MOUTH AT BEDTIME. 90 tablet 3  . doxycycline (VIBRA-TABS) 100 MG tablet Take 1 tablet (100 mg total) by mouth 2 (two) times  daily. 14 tablet 0  . DULoxetine (CYMBALTA) 30 MG capsule Take 1 capsule (30 mg total) by mouth daily. 90 capsule 3  . DULoxetine (CYMBALTA) 60 MG capsule Take 1 capsule (60 mg total) by mouth daily. 90 capsule 3  . fluticasone (FLONASE) 50 MCG/ACT nasal spray Place 2 sprays into both nostrils daily. 48 g 3  . Fluticasone-Salmeterol (ADVAIR) 500-50 MCG/DOSE AEPB Inhale 1 puff into the lungs 2 (two) times daily.    Marland Kitchen glucose blood (FREESTYLE TEST STRIPS) test strip Use to test blood sugar 3 times daily. Dx: E11.65 100 each 3  . insulin glargine (LANTUS) 100 UNIT/ML injection Inject 20 Units into the skin every morning.    . insulin lispro (HUMALOG KWIKPEN) 100 UNIT/ML KiwkPen Inject 4-6 units into the skin daily. (Patient taking differently: Inject 4 Units into the skin daily. ) 5 pen 5  . Lancets (FREESTYLE) lancets Use to test blood sugar 3 times daily. Dx: E11.65 100 each 3  . levothyroxine (SYNTHROID, LEVOTHROID) 50 MCG tablet Take 1 tablet (50 mcg total) by mouth daily before breakfast. 90 tablet 1  . magnesium oxide (MAG-OX) 400 MG tablet Take 1 tablet (400 mg total) by mouth daily. 90 tablet 1  . Melatonin 5 MG TABS Take 5 mg by mouth at bedtime.    . methylPREDNISolone (MEDROL DOSEPAK) 4 MG TBPK tablet Take as directed. 21 tablet 0  . montelukast (SINGULAIR) 10 MG tablet TAKE 1 TABLET (10 MG TOTAL) BY MOUTH DAILY. 90 tablet 1  . Multiple Vitamins-Minerals (CENTRUM ADULTS PO) Take by mouth.    . Omega-3 Fatty Acids (FISH OIL) 1000 MG CAPS Take by mouth.    Marland Kitchen omeprazole (PRILOSEC) 20 MG capsule Take 1 capsule (20 mg total) by mouth daily. 90 capsule 3  . Polyethyl Glycol-Propyl Glycol (SYSTANE FREE OP) Apply 1 drop to eye 3 (three) times daily.    . predniSONE  (DELTASONE) 1 MG tablet Take 2 mg by mouth daily.     . rosuvastatin (CRESTOR) 10 MG tablet TAKE 1 TABLET (10 MG TOTAL) BY MOUTH EVERY EVENING. 90 tablet 2  . SPIRIVA HANDIHALER 18 MCG inhalation capsule PLACE 1 CAPSULE INTO INHALER AND INHALE AS DIRECTED EVERY DAY 30 capsule 4  . triamcinolone cream (KENALOG) 0.1 % Apply 1 application topically 2 (two) times daily. 30 g 0  . trimethoprim (TRIMPEX) 100 MG tablet Take 100 mg by mouth daily.  11  . vitamin B-12 (CYANOCOBALAMIN) 250 MCG tablet Take 250 mcg by mouth daily.     No facility-administered medications prior to visit.     Review of Systems  Constitutional: Negative for chills, fever, malaise/fatigue and weight loss.  HENT: Negative for hearing loss, sore throat and tinnitus.   Eyes: Negative for blurred vision and double vision.  Respiratory: Positive for cough and shortness of breath. Negative for hemoptysis, sputum production, wheezing and stridor.   Cardiovascular: Negative for chest pain, palpitations, orthopnea, leg swelling and PND.  Gastrointestinal: Negative for abdominal pain, constipation, diarrhea, heartburn, nausea and vomiting.  Genitourinary: Negative for dysuria, hematuria and urgency.  Musculoskeletal: Negative for joint pain and myalgias.  Skin: Negative for itching and rash.  Neurological: Negative for dizziness, tingling, weakness and headaches.  Endo/Heme/Allergies: Negative for environmental allergies. Does not bruise/bleed easily.  Psychiatric/Behavioral: Negative for depression. The patient is not nervous/anxious and does not have insomnia.   All other systems reviewed and are negative.    Objective:  Physical Exam Vitals signs reviewed.  Constitutional:  General: She is not in acute distress.    Appearance: She is well-developed.  HENT:     Head: Normocephalic and atraumatic.  Eyes:     General: No scleral icterus.    Conjunctiva/sclera: Conjunctivae normal.     Pupils: Pupils are equal, round,  and reactive to light.  Neck:     Musculoskeletal: Neck supple.     Vascular: No JVD.     Trachea: No tracheal deviation.  Cardiovascular:     Rate and Rhythm: Normal rate and regular rhythm.     Heart sounds: Normal heart sounds. No murmur.  Pulmonary:     Effort: Pulmonary effort is normal. No tachypnea, accessory muscle usage or respiratory distress.     Breath sounds: No stridor. Wheezing and rhonchi present. No rales.  Abdominal:     General: There is no distension.     Palpations: Abdomen is soft.     Tenderness: There is no abdominal tenderness.  Musculoskeletal:        General: No tenderness.  Lymphadenopathy:     Cervical: No cervical adenopathy.  Skin:    General: Skin is warm and dry.     Capillary Refill: Capillary refill takes less than 2 seconds.     Findings: No rash.  Neurological:     Mental Status: She is alert and oriented to person, place, and time.  Psychiatric:        Behavior: Behavior normal.      Vitals:   03/30/18 1334  BP: 128/82  Pulse: 79  SpO2: 96%  Weight: 150 lb (68 kg)  Height: 5' (1.524 m)   96% on RA BMI Readings from Last 3 Encounters:  03/30/18 29.29 kg/m  03/23/18 29.29 kg/m  03/20/18 29.61 kg/m   Wt Readings from Last 3 Encounters:  03/30/18 150 lb (68 kg)  03/23/18 150 lb (68 kg)  03/20/18 151 lb 9.6 oz (68.8 kg)     CBC    Component Value Date/Time   WBC 11.5 (H) 11/28/2017 1439   WBC 17.1 (H) 10/17/2017 1146   RBC 3.58 (L) 11/28/2017 1439   HGB 11.7 11/28/2017 1439   HCT 36.3 11/28/2017 1439   HCT 36.9 11/28/2017 1438   PLT 304 11/28/2017 1439   MCV 101.4 (H) 11/28/2017 1439   MCH 32.6 11/28/2017 1439   MCHC 32.2 11/28/2017 1439   RDW 13.0 11/28/2017 1439   LYMPHSABS 2.8 11/28/2017 1439   MONOABS 0.7 11/28/2017 1439   EOSABS 0.3 11/28/2017 1439   BASOSABS 0.1 11/28/2017 1439    Chest Imaging: Last axial imaging of the chest was in 2016: Evidence of mucoid impaction, cylindric varicoid  bronchiectasis.  Pulmonary Functions Testing Results: No flowsheet data found.   Prior office spirometry with no evidence of obstruction.     Assessment & Plan:   SOB (shortness of breath)  Cough  Asthma, chronic, mild persistent, uncomplicated  Bronchiectasis without acute exacerbation (HCC)  Current chronic use of systemic steroids  Discussion:  This is an 83 year old female with a past medical history of chronic steroid use.  She states she has been taking 2 mg of oral prednisone daily for the past 20+ years.  Her last axial imaging of the chest revealed evidence of bronchiectasis as well as a known history of asthma for the past several years treated with Advair.  She also takes many medications.  Today is my first time meeting her to establish care as her new pulmonologist.  Today we discussed the importance of  extensive side effects that can be seen on chronic steroid use.  I believe she has already suffered some of these to include cataracts as well as insulin resistance and diabetes.  She states that she was started on prednisone for inflammation all over.  There is some documentation of the chart for possible PMR but this is unclear to me.  From a pulmonary standpoint she has no need to stay on chronic prednisone.  I encouraged her to speak with her primary care provider about coming off of this.  Her last axial imaging had concern for possible mycobacterial infection.  No prior sputum cultures were positive for AFB.  I recommended that she have a repeat CAT scan done of the chest but she declined.  I would like to know if she has had any progression of her bronchiectasis or if there is continued evidence of NTM.  Even if there was a not sure she would be a good candidate for treatment of NTM due to her age and risk of polypharmacy with medication interactions that would be required for treatment.  She requested to follow-up in 6 months.  Greater than 50% of this patient's  40-minute office visit was spent face-to-face discussing above recommendations and treatment plan.   Current Outpatient Medications:  .  ADVAIR DISKUS 500-50 MCG/DOSE AEPB, INHALE 1 PUFF BY MOUTH TWICE A DAY, Disp: 180 each, Rfl: 2 .  albuterol (PROVENTIL HFA;VENTOLIN HFA) 108 (90 Base) MCG/ACT inhaler, Inhale 2 puffs into the lungs every 6 (six) hours as needed for wheezing or shortness of breath., Disp: 1 Inhaler, Rfl: 11 .  Ascorbic Acid (VITAMIN C) 1000 MG tablet, Take 1,000 mg by mouth daily., Disp: , Rfl:  .  baclofen (LIORESAL) 10 MG tablet, TAKE 1/2 TABLETS BY MOUTH 2 TIMES DAILY AS NEEDED FOR MUSCLE SPASMS/PAIN, Disp: 30 tablet, Rfl: 0 .  BD PEN NEEDLE NANO U/F 32G X 4 MM MISC, USE 4 TIMES A DAY, Disp: 400 each, Rfl: 1 .  busPIRone (BUSPAR) 5 MG tablet, Take 1-2 tablets (5-10 mg total) by mouth 2 (two) times daily., Disp: 120 tablet, Rfl: 0 .  calcium carbonate (OSCAL) 1500 (600 Ca) MG TABS tablet, Take 1,500 mg by mouth 2 (two) times daily with a meal., Disp: , Rfl:  .  carvedilol (COREG) 3.125 MG tablet, 2 tabs daily, Disp: , Rfl:  .  Cholecalciferol (VITAMIN D3) 2000 units TABS, Take 2 tablets by mouth daily. , Disp: , Rfl:  .  cycloSPORINE (RESTASIS) 0.05 % ophthalmic emulsion, Place 1 drop into both eyes 2 (two) times daily., Disp: , Rfl:  .  denosumab (PROLIA) 60 MG/ML SOLN injection, Inject 60 mg into the skin every 6 (six) months. Administer in upper arm, thigh, or abdomen, Disp: , Rfl:  .  Dextromethorphan-Guaifenesin (MUCINEX DM MAXIMUM STRENGTH) 60-1200 MG TB12, Take 1 tablet by mouth 2 (two) times daily. Reported on 06/23/2015, Disp: , Rfl:  .  donepezil (ARICEPT) 5 MG tablet, TAKE 1 TABLET (5 MG TOTAL) BY MOUTH AT BEDTIME., Disp: 90 tablet, Rfl: 3 .  doxycycline (VIBRA-TABS) 100 MG tablet, Take 1 tablet (100 mg total) by mouth 2 (two) times daily., Disp: 14 tablet, Rfl: 0 .  DULoxetine (CYMBALTA) 30 MG capsule, Take 1 capsule (30 mg total) by mouth daily., Disp: 90 capsule, Rfl:  3 .  DULoxetine (CYMBALTA) 60 MG capsule, Take 1 capsule (60 mg total) by mouth daily., Disp: 90 capsule, Rfl: 3 .  fluticasone (FLONASE) 50 MCG/ACT nasal spray, Place 2  sprays into both nostrils daily., Disp: 48 g, Rfl: 3 .  Fluticasone-Salmeterol (ADVAIR) 500-50 MCG/DOSE AEPB, Inhale 1 puff into the lungs 2 (two) times daily., Disp: , Rfl:  .  glucose blood (FREESTYLE TEST STRIPS) test strip, Use to test blood sugar 3 times daily. Dx: E11.65, Disp: 100 each, Rfl: 3 .  insulin glargine (LANTUS) 100 UNIT/ML injection, Inject 20 Units into the skin every morning., Disp: , Rfl:  .  insulin lispro (HUMALOG KWIKPEN) 100 UNIT/ML KiwkPen, Inject 4-6 units into the skin daily. (Patient taking differently: Inject 4 Units into the skin daily. ), Disp: 5 pen, Rfl: 5 .  Lancets (FREESTYLE) lancets, Use to test blood sugar 3 times daily. Dx: E11.65, Disp: 100 each, Rfl: 3 .  levothyroxine (SYNTHROID, LEVOTHROID) 50 MCG tablet, Take 1 tablet (50 mcg total) by mouth daily before breakfast., Disp: 90 tablet, Rfl: 1 .  magnesium oxide (MAG-OX) 400 MG tablet, Take 1 tablet (400 mg total) by mouth daily., Disp: 90 tablet, Rfl: 1 .  Melatonin 5 MG TABS, Take 5 mg by mouth at bedtime., Disp: , Rfl:  .  methylPREDNISolone (MEDROL DOSEPAK) 4 MG TBPK tablet, Take as directed., Disp: 21 tablet, Rfl: 0 .  montelukast (SINGULAIR) 10 MG tablet, TAKE 1 TABLET (10 MG TOTAL) BY MOUTH DAILY., Disp: 90 tablet, Rfl: 1 .  Multiple Vitamins-Minerals (CENTRUM ADULTS PO), Take by mouth., Disp: , Rfl:  .  Omega-3 Fatty Acids (FISH OIL) 1000 MG CAPS, Take by mouth., Disp: , Rfl:  .  omeprazole (PRILOSEC) 20 MG capsule, Take 1 capsule (20 mg total) by mouth daily., Disp: 90 capsule, Rfl: 3 .  Polyethyl Glycol-Propyl Glycol (SYSTANE FREE OP), Apply 1 drop to eye 3 (three) times daily., Disp: , Rfl:  .  predniSONE (DELTASONE) 1 MG tablet, Take 2 mg by mouth daily. , Disp: , Rfl:  .  rosuvastatin (CRESTOR) 10 MG tablet, TAKE 1 TABLET (10 MG  TOTAL) BY MOUTH EVERY EVENING., Disp: 90 tablet, Rfl: 2 .  SPIRIVA HANDIHALER 18 MCG inhalation capsule, PLACE 1 CAPSULE INTO INHALER AND INHALE AS DIRECTED EVERY DAY, Disp: 30 capsule, Rfl: 4 .  triamcinolone cream (KENALOG) 0.1 %, Apply 1 application topically 2 (two) times daily., Disp: 30 g, Rfl: 0 .  trimethoprim (TRIMPEX) 100 MG tablet, Take 100 mg by mouth daily., Disp: , Rfl: 11 .  vitamin B-12 (CYANOCOBALAMIN) 250 MCG tablet, Take 250 mcg by mouth daily., Disp: , Rfl:    Garner Nash, DO [facility] 03/30/2018 1:36 PM

## 2018-03-30 NOTE — Patient Instructions (Addendum)
Thank you for visiting Dr. Valeta Harms at [facility]. Today we recommend the following:   Return in about 6 months (around 09/28/2018), or if symptoms worsen or fail to improve.

## 2018-04-13 ENCOUNTER — Ambulatory Visit (INDEPENDENT_AMBULATORY_CARE_PROVIDER_SITE_OTHER): Payer: Medicare Other | Admitting: Internal Medicine

## 2018-04-13 ENCOUNTER — Encounter: Payer: Self-pay | Admitting: Internal Medicine

## 2018-04-13 VITALS — BP 128/60 | HR 67 | Ht 60.0 in | Wt 150.0 lb

## 2018-04-13 DIAGNOSIS — E1165 Type 2 diabetes mellitus with hyperglycemia: Secondary | ICD-10-CM

## 2018-04-13 DIAGNOSIS — E039 Hypothyroidism, unspecified: Secondary | ICD-10-CM

## 2018-04-13 DIAGNOSIS — M810 Age-related osteoporosis without current pathological fracture: Secondary | ICD-10-CM | POA: Diagnosis not present

## 2018-04-13 DIAGNOSIS — Z794 Long term (current) use of insulin: Secondary | ICD-10-CM | POA: Diagnosis not present

## 2018-04-13 DIAGNOSIS — E559 Vitamin D deficiency, unspecified: Secondary | ICD-10-CM | POA: Diagnosis not present

## 2018-04-13 DIAGNOSIS — E213 Hyperparathyroidism, unspecified: Secondary | ICD-10-CM

## 2018-04-13 LAB — POCT GLYCOSYLATED HEMOGLOBIN (HGB A1C): Hemoglobin A1C: 6.7 % — AB (ref 4.0–5.6)

## 2018-04-13 NOTE — Patient Instructions (Addendum)
Please come back for labs in 5 weeks.  Please continue: - Lantus 20 units in am. - Humalog 4 units once a day, before b'fast.  For now, continue Levothyroxine 50 mcg daily.  Move b'fast at least 30 min later.  Move calcium at least 4h later.  Take the thyroid hormone every day, with water, at least 30 minutes before breakfast, separated by at least 4 hours from: - acid reflux medications - calcium - iron - multivitamins  Continue vitamin D 4000 units.  Check sugars 1-2x a day, rotating check times.  Please come back for a follow-up appointment in 6 months.

## 2018-04-13 NOTE — Progress Notes (Signed)
Patient ID: Kayla Thompson, female   DOB: 1935-12-06, 83 y.o.   MRN: [id number]   HPI  Kayla Thompson is a 83 y.o.-year-old female, initially referred by her OB/GYN doctor, Dr. Uvaldo Rising, for evaluation for hyperparathyroidism + normo/hypo-calcemia, vitamin D deficiency,  and DM2, dx 2010, insulin-dependent since 2010, controlled, w/o long-term complications. Last visit 1 year and 6 months ago.  She is usually not compliant with appointments.  + falls since last visit >> hurt elbow and arm but did not fracture. She had to go to [facility].  Her urinary incontinence is worse.   Reviewed and addended history: Pt was dx with hyperparathyroidism in 08/2014.  Reviewed pertinent labs: Lab Results  Component Value Date   PTH 37 06/09/2015   PTH Comment 06/09/2015   PTH 73 (H) 10/28/2014   PTH Comment 10/28/2014   PTH 190 (H) 09/10/2014   PTH 243 (H) 08/23/2014   CALCIUM 9.9 02/03/2018   CALCIUM 9.8 10/17/2017   CALCIUM 9.0 10/10/2017   CALCIUM 8.7 (L) 10/09/2017   CALCIUM 9.5 05/18/2017   CALCIUM 9.3 12/01/2016   CALCIUM 9.3 07/15/2016   CALCIUM 9.2 10/21/2015   CALCIUM 9.2 06/09/2015   CALCIUM 9.4 03/03/2015   Of note, patient has osteoporosis, diagnosed in 2007.  Reviewed patient's DXA scans: Date L1-L2 (L3 and L4 excluded) T score FN T score  01/05/2018  +0.4  LFN: -2.8  07/25/2014   -0.1  LFN: -2.7  Right hip could not be analyzed due to previous instrumentation.  She has a history of multiple fractures: - 09/23/1990: pelvic fx - 02/2005: R hip fx - rod - 05/2006: rib fx - 12/2008: 3 R ankle fx - in [place] - plate and screws - 15/7262: wrist fx's 01/2012: steroid inj's R knee - 07/2014: forearm fx and nasal bone fx  She has been on Prolia since 2017- 5 injections so far.  Tolerating it well.  No h/o kidney stones.  + Mild CKD. Last BUN/Cr: Lab Results  Component Value Date   BUN 22 02/03/2018   CREATININE 1.19 02/03/2018   Pt is not on HCTZ.  Patient  continues on Caltrate 600 mg now 1x a day and vitamin D 4000 units daily.  Pt does not have a FH of hypercalcemia, pituitary tumors, thyroid cancer, or osteoporosis.   Latest vit D normal: Lab Results  Component Value Date   VD25OH 81.19 06/09/2015   VD25OH 65.60 01/28/2015   VD25OH 48 08/23/2014   A 24h urine collection to check urinary calcium >> normal: Component     Latest Ref Rng 03/31/2015  Calcium, Ur     Not estab mg/dL 9  Calcium, 24 hour urine     35 - 250 mg/24 h 108  Creatinine, Urine     20 - 320 mg/dL 78  Creatinine, 24H Ur     0.63 - 2.50 g/24 h 0.94    DM2: - dx 2010 >> Started insulin at diagnosis  Reviewed HbA1c levels: Lab Results  Component Value Date   HGBA1C 6.7 10/14/2016   HGBA1C 6.9 (H) 07/15/2016   HGBA1C 7.2 (H) 10/21/2015   She is on: - Lantus 30 >> 25 >> 20 units in am - Humalog 4-6 units before breakfast only  She checks sugars 1x a day: - am: 46, 80-121, 138 >> 102-112 >> 99-119 - after lunch: 120 >> 116-154 >> n/c  - after dinner: 132 >> 70 >> n/c - bedtime: 142, 228 >> 97, 116 >> 60 >>  n/c Lowest: 92 >> 37 (?) on BMP 04/2017 - no lows since then.  Highest: 189 >> 147 with a steroid inj's - last inj 3 weeks ago.  -+ HL; latest lipids:  Lab Results  Component Value Date   CHOL 219 (H) 07/15/2016   HDL 68.60 07/15/2016   LDLCALC 113 (H) 07/15/2016   TRIG 187.0 (H) 07/15/2016   CHOLHDL 3 07/15/2016  On Crestor 10.  + Mild CKD. Latest BUN/Cr: Lab Results  Component Value Date   BUN 22 02/03/2018   Lab Results  Component Value Date   CREATININE 1.19 02/03/2018   Last dilated eye exam: 2018: No DR. Dr Gershon Crane.  She has a history of chalazion, has a history of cataract surgery.  She also has a history of Ovarian cancer - 1996, urinary incontinence, HTN, HL.  She has a history of hypothyroidism, with the last TSH being suppressed: Lab Results  Component Value Date   TSH 0.23 (L) 12/01/2016   Pt is on levothyroxine 50 mcg  daily, taken: - in am - fasting - ~10 min from b'fast! - + Ca along with LT4! -  Fe, MVI, PPIs - not on Biotin  ROS: Constitutional: no weight gain/no weight loss, no fatigue, no subjective hyperthermia, no subjective hypothermia, + nocturia Eyes: no blurry vision, no xerophthalmia ENT: no sore throat, no nodules palpated in neck, no dysphagia, no odynophagia, no hoarseness Cardiovascular: no CP/no SOB/no palpitations/no leg swelling Respiratory: no cough/no SOB/no wheezing Gastrointestinal: no N/no V/no D/no C/no acid reflux Musculoskeletal: + muscle aches/no joint aches Skin: no rashes, no hair loss Neurological: no tremors/no numbness/no tingling/no dizziness  I reviewed pt's medications, allergies, PMH, social hx, family hx, and changes were documented in the history of present illness. Otherwise, unchanged from my initial visit note.  Past Medical History:  Diagnosis Date  . Arthritis   . Asthma   . Diabetes mellitus without complication ([place])   . Fibromyalgia   . GERD (gastroesophageal reflux disease)   . History of chemotherapy   . History of fractured pelvis   . Hypertension   . Osteoporosis 12/2017   T score -2.8 stable from prior DEXA  . Osteoporosis   . Ovarian cancer [facility])    Past Surgical History:  Procedure Laterality Date  . ABDOMINAL HYSTERECTOMY  1996  . ANKLE FRACTURE SURGERY     plate and screws  . BLADDER SUSPENSION    . CATARACT EXTRACTION    . EYE SURGERY    . hip surgey    . knee surgey    . LUNG SURGERY    . OOPHORECTOMY     BSO   History   Social History  . Marital Status: Widowed    Spouse Name: N/A  . Number of Children: 2 (1 adopted)   Occupational History  . retired   Social History Main Topics  . Smoking status: Never Smoker   . Smokeless tobacco: Never Used  . Alcohol Use: No  . Drug Use: No   Current Outpatient Medications on File Prior to Visit  Medication Sig Dispense Refill  . albuterol (PROVENTIL HFA;VENTOLIN HFA)  108 (90 Base) MCG/ACT inhaler Inhale 2 puffs into the lungs every 6 (six) hours as needed for wheezing or shortness of breath. 1 Inhaler 11  . baclofen (LIORESAL) 10 MG tablet TAKE 1/2 TABLETS BY MOUTH 2 TIMES DAILY AS NEEDED FOR MUSCLE SPASMS/PAIN 30 tablet 0  . BD PEN NEEDLE NANO U/F 32G X 4 MM MISC USE 4  TIMES A DAY 400 each 1  . calcium carbonate (OSCAL) 1500 (600 Ca) MG TABS tablet Take 1,500 mg by mouth 2 (two) times daily with a meal.    . carvedilol (COREG) 3.125 MG tablet 2 tabs daily    . Cholecalciferol (VITAMIN D3) 2000 units TABS Take 2 tablets by mouth daily.     Marland Kitchen denosumab (PROLIA) 60 MG/ML SOLN injection Inject 60 mg into the skin every 6 (six) months. Administer in upper arm, thigh, or abdomen    . Dexlansoprazole (DEXILANT) 30 MG capsule Take 30 mg by mouth daily.    Marland Kitchen Dextromethorphan-Guaifenesin (MUCINEX DM MAXIMUM STRENGTH) 60-1200 MG TB12 Take 1 tablet by mouth 2 (two) times daily. Reported on 06/23/2015    . doxycycline (VIBRA-TABS) 100 MG tablet Take 1 tablet (100 mg total) by mouth 2 (two) times daily. 14 tablet 0  . DULoxetine (CYMBALTA) 30 MG capsule Take 1 capsule (30 mg total) by mouth daily. 90 capsule 3  . fluticasone (FLONASE) 50 MCG/ACT nasal spray Place 2 sprays into both nostrils daily. 48 g 3  . Fluticasone-Salmeterol (ADVAIR) 500-50 MCG/DOSE AEPB Inhale 1 puff into the lungs 2 (two) times daily.    Marland Kitchen glucose blood (FREESTYLE TEST STRIPS) test strip Use to test blood sugar 3 times daily. Dx: E11.65 100 each 3  . insulin glargine (LANTUS) 100 UNIT/ML injection Inject 20 Units into the skin every morning.    . insulin lispro (HUMALOG KWIKPEN) 100 UNIT/ML KiwkPen Inject 4-6 units into the skin daily. (Patient taking differently: Inject 4 Units into the skin daily. ) 5 pen 5  . Lancets (FREESTYLE) lancets Use to test blood sugar 3 times daily. Dx: E11.65 100 each 3  . levothyroxine (SYNTHROID, LEVOTHROID) 50 MCG tablet Take 1 tablet (50 mcg total) by mouth daily before  breakfast. 90 tablet 1  . methylPREDNISolone (MEDROL DOSEPAK) 4 MG TBPK tablet Take as directed. 21 tablet 0  . montelukast (SINGULAIR) 10 MG tablet TAKE 1 TABLET (10 MG TOTAL) BY MOUTH DAILY. 90 tablet 1  . Multiple Vitamins-Minerals (CENTRUM ADULTS PO) Take by mouth.    Vladimir Faster Glycol-Propyl Glycol (SYSTANE FREE OP) Apply 1 drop to eye 3 (three) times daily.    . predniSONE (DELTASONE) 1 MG tablet Take 2 mg by mouth daily.     . ranitidine (ZANTAC) 150 MG tablet Take 150 mg by mouth daily.    . rosuvastatin (CRESTOR) 10 MG tablet TAKE 1 TABLET (10 MG TOTAL) BY MOUTH EVERY EVENING. 90 tablet 2  . SPIRIVA HANDIHALER 18 MCG inhalation capsule PLACE 1 CAPSULE INTO INHALER AND INHALE AS DIRECTED EVERY DAY 30 capsule 4  . vitamin B-12 (CYANOCOBALAMIN) 250 MCG tablet Take 250 mcg by mouth daily.    Marland Kitchen zolpidem (AMBIEN) 5 MG tablet Take 5 mg by mouth at bedtime as needed for sleep.     No current facility-administered medications on file prior to visit.    Allergies  Allergen Reactions  . Latex Rash  . Penicillins Rash    Has patient had a PCN reaction causing immediate rash, facial/tongue/throat swelling, SOB or lightheadedness with hypotension: No Has patient had a PCN reaction causing severe rash involving mucus membranes or skin necrosis: No Has patient had a PCN reaction that required hospitalization: No Has patient had a PCN reaction occurring within the last 10 years: No If all of the above answers are "NO", then may proceed with Cephalosporin use.    Family History  Problem Relation Age of Onset  .  Lung cancer Mother   . CAD Father   . Diabetes Father   . Lung cancer Maternal Aunt   . Diabetes Paternal Aunt   . Diabetes Paternal Uncle   . Diabetes Paternal Grandmother   . Diabetes Paternal Grandfather    PE: BP 128/60   Pulse 67   Ht 5' (1.524 m) Comment: measured  Wt 150 lb (68 kg)   SpO2 98%   BMI 29.29 kg/m  Body mass index is 29.29 kg/m. Wt Readings from Last 3  Encounters:  04/13/18 150 lb (68 kg)  03/30/18 150 lb (68 kg)  03/23/18 150 lb (68 kg)   Constitutional: overweight, in NAD, walks with a walker Eyes: PERRLA, EOMI, no exophthalmos ENT: moist mucous membranes, no thyromegaly, no cervical lymphadenopathy Cardiovascular: RRR, No MRG Respiratory: CTA B Gastrointestinal: abdomen soft, NT, ND, BS+ Musculoskeletal: no deformities, strength intact in all 4 Skin: moist, warm, no rashes Neurological: + tremor with outstretched hands, DTR normal in all 4  Assessment: 1. H/o Hyperparathyroidism - recent normocalcemia  2. Vitamin D def  3. DM2, insulin-dependent, controlled, without complications  4. Low TSH  5.  Osteoporosis  Plan: 1. HPTH and 2. Vit D def -Patient with history of high parathyroid hormone in the setting of low calcium initially, which normalized subsequently.  She continues on Prolia injections, calcium (600 mg once a day) vitamin D supplements (4000 units daily). -Calcium and PTH were normal at last check -We will continue to follow calcium but will not recheck PTH levels unless calcium starts rising -We will check a vitamin D level at next lab draw.  For now, continue the current supplement.  3. DM2 - Patient with longstanding, uncontrolled, type 2 diabetes, on basal-bolus insulin regimen, returning after long absence.  She is on Lantus and NovoLog only before breakfast. -Since last visit, she had a glucose of 37 on the [place] from 04/2016. No low blood sugars since - I advised her to  Patient Instructions  Please come back for labs in 5 weeks.  Please continue: - Lantus 20 units in am. - Humalog 4 units once a day, before b'fast.  For now, continue Levothyroxine 50 mcg daily.  Move b'fast at least 30 min later.  Move calcium at least 4h later.  Take the thyroid hormone every day, with water, at least 30 minutes before breakfast, separated by at least 4 hours from: - acid reflux medications - calcium -  iron - multivitamins  Continue vitamin D 4000 units.  Check sugars 1-2x a day, rotating check times.  Please come back for a follow-up appointment in 6 months.  - today, HbA1c is 6.7% (great, stable) - continue checking sugars at different times of the day - check 1-2x a day, rotating checks - advised for yearly eye exams >> she is not UTD - Return to clinic in 3 mo with sugar log   4.  Low TSH -She had a low TSH 11/2016 -She is on 50 mcg of levothyroxine, but she does not take it correctly.  She takes this close to breakfast and also takes it with calcium.  We discussed about the need to move breakfast at least 30 minutes away and calcium at least 4 hours away. As her TSH was suppressed at last check and also as we are moving her calcium supplement later, I am worried that continuing the same dose of levothyroxine will render her thyrotoxic.  We discussed that I will suggest to decrease the dose of levothyroxine  to 25 mcg daily, but she refuses as she is afraid that she will start losing hair if this happens.  She does agree to come back in 5 weeks after separating levothyroxine from breakfast and calcium for a recheck of her thyroid test.  We may need to decrease and even stop levothyroxine afterwards, depending on the results.  5.  Osteoporosis -Latest DXA scan was reviewed from 01/05/2018: Left femoral neck -2.8 T score compared to 07/25/2014: -2.6 T score.  Right femoral neck could not be analyzed because of hardware.  The scores were stable. -We will continue Prolia for now.  She had 5 injections so far, from 07/1998 17-0 10/2017.  She is due for another injection.  Advised her to not miss injections.  Orders Placed This Encounter  Procedures  . TSH  . T4, free  . Vitamin D, 25-hydroxy  . POCT glycosylated hemoglobin (Hb A1C)    Philemon Kingdom, MD PhD [facility] Endocrinology

## 2018-04-17 ENCOUNTER — Other Ambulatory Visit: Payer: Self-pay | Admitting: Internal Medicine

## 2018-05-10 ENCOUNTER — Other Ambulatory Visit: Payer: Self-pay | Admitting: Internal Medicine

## 2018-05-19 ENCOUNTER — Other Ambulatory Visit: Payer: Self-pay | Admitting: Internal Medicine

## 2018-05-19 DIAGNOSIS — E039 Hypothyroidism, unspecified: Secondary | ICD-10-CM

## 2018-05-25 ENCOUNTER — Telehealth: Payer: Self-pay | Admitting: Internal Medicine

## 2018-05-25 NOTE — Telephone Encounter (Signed)
Insurance has been submitted and verified for Prolia. Patient is responsible for a $0 copay. Due anytime. Left message for patient to call back to schedule.  Okay to schedule... Visit Note: Prolia ($0 copay - okay to give per [place]) Visit Type: Nurse Provider: Nurse 

## 2018-05-31 ENCOUNTER — Telehealth: Payer: Self-pay | Admitting: Internal Medicine

## 2018-05-31 NOTE — Telephone Encounter (Signed)
Copied from [place] [id number]. Topic: Quick Communication - See Telephone Encounter >> May 31, 2018 11:25 AM Bea Graff, NT wrote: CRM for notification. See Telephone encounter for: 05/31/18. Pt wanting to see if Dr. Sharlet Salina received anything from Dr. Renne Crigler about pt needing an order for an x-ray? Pt did not specify what was recommended to be x-rayed. Please advise.

## 2018-05-31 NOTE — Telephone Encounter (Signed)
Do you know anything about this? 

## 2018-06-01 ENCOUNTER — Ambulatory Visit: Payer: Medicare Other

## 2018-06-01 NOTE — Telephone Encounter (Signed)
She has not seen them since January. I have not had any communication from them about any needs for her.

## 2018-06-01 NOTE — Telephone Encounter (Signed)
Informed patient that we have not heard anything and she stated understanding

## 2018-06-02 ENCOUNTER — Other Ambulatory Visit: Payer: Self-pay | Admitting: Internal Medicine

## 2018-06-06 ENCOUNTER — Other Ambulatory Visit: Payer: Self-pay | Admitting: Internal Medicine

## 2018-06-06 ENCOUNTER — Telehealth: Payer: Self-pay | Admitting: Internal Medicine

## 2018-06-07 NOTE — Telephone Encounter (Signed)
Patient states she needs her predniSONE (DELTASONE) 1 MG tablet refilled and that Dr Chaya Jan wrote the script but he is no longer a provider.

## 2018-06-08 NOTE — Telephone Encounter (Signed)
LVM with MD response  

## 2018-06-08 NOTE — Telephone Encounter (Signed)
She needs to get this from her pulmonary provider or rheumatologist, I cannot prescribe as I do not know that she needs it etc.

## 2018-06-09 ENCOUNTER — Other Ambulatory Visit: Payer: Self-pay | Admitting: Internal Medicine

## 2018-06-18 ENCOUNTER — Other Ambulatory Visit: Payer: Self-pay | Admitting: Internal Medicine

## 2018-07-10 ENCOUNTER — Other Ambulatory Visit: Payer: Self-pay | Admitting: Internal Medicine

## 2018-07-25 ENCOUNTER — Telehealth: Payer: Self-pay

## 2018-07-25 ENCOUNTER — Ambulatory Visit (INDEPENDENT_AMBULATORY_CARE_PROVIDER_SITE_OTHER): Payer: Medicare Other | Admitting: Internal Medicine

## 2018-07-25 ENCOUNTER — Other Ambulatory Visit: Payer: Self-pay | Admitting: Internal Medicine

## 2018-07-25 ENCOUNTER — Ambulatory Visit (INDEPENDENT_AMBULATORY_CARE_PROVIDER_SITE_OTHER): Payer: Medicare Other | Admitting: Gynecology

## 2018-07-25 ENCOUNTER — Other Ambulatory Visit: Payer: Self-pay

## 2018-07-25 ENCOUNTER — Encounter: Payer: Self-pay | Admitting: Internal Medicine

## 2018-07-25 ENCOUNTER — Encounter: Payer: Self-pay | Admitting: Gynecology

## 2018-07-25 DIAGNOSIS — M353 Polymyalgia rheumatica: Secondary | ICD-10-CM | POA: Diagnosis not present

## 2018-07-25 DIAGNOSIS — N3090 Cystitis, unspecified without hematuria: Secondary | ICD-10-CM

## 2018-07-25 DIAGNOSIS — J4530 Mild persistent asthma, uncomplicated: Secondary | ICD-10-CM | POA: Diagnosis not present

## 2018-07-25 MED ORDER — NITROFURANTOIN MONOHYD MACRO 100 MG PO CAPS: 100.0000 mg | ORAL_CAPSULE | Freq: Two times a day (BID) | ORAL | 0 refills | Status: DC

## 2018-07-25 MED ORDER — PREDNISONE 1 MG PO TABS: 2.0000 mg | ORAL_TABLET | Freq: Every day | ORAL | 1 refills | Status: DC

## 2018-07-25 NOTE — Telephone Encounter (Signed)
Would need virtual visit for SOB and can address magnesium at that time. Also pharmacies are allowing pickup of many otc medications at drive through at this time.

## 2018-07-25 NOTE — Telephone Encounter (Signed)
Appointment scheduled.

## 2018-07-25 NOTE — Telephone Encounter (Signed)
Routing to scheduling, can you please reach out to patient's daughter (staying with her mom right now) to set up virtual visit, she has laptop capable to do so------thanks

## 2018-07-25 NOTE — Progress Notes (Signed)
    Kayla Thompson [birth year] [id number]        83 y.o.  G1P1 calls for telemedicine encounter.  She is at home and I am in my office.  She was identified by 2 identifiers.  We discussed the limitations of a telephone triage and she accepts these.  For 2 weeks she has had discomfort with urination.  No other abdominal or pelvic discomfort in between.  She has always had an issue as far as incontinence and notes that it does not hurt if she has spontaneous leakage but only when she actively voids.  She is not having urgency, low back pain, fever or chills.  No nausea, vomiting, diarrhea or constipation.  Has had UTIs in the past but has been a number of years.  Past medical history,surgical history, problem list, medications, allergies, family history and social history were all reviewed and documented in the EPIC chart.  Directed ROS with pertinent positives and negatives documented in the history of present illness/assessment and plan.   Assessment/Plan:  83 y.o. G1P1 with history suggesting cystitis.  Options for management were reviewed to include antibiotic choices.  Given that she is had the symptoms for 2 weeks am going to cover her with Macrobid 100 mg twice daily opposed to a 3-day course of another antibiotic.  ASAP call precautions reviewed and she will call back if her symptoms persist, worsen or recur.  12 minutes of time was taken in direct communication with the patient and review of her records for management of her condition to include prescription of medication.    Anastasio Auerbach MD, 12:42 PM 07/25/2018

## 2018-07-25 NOTE — Telephone Encounter (Signed)
Patient is requesting refill on magnesium oxide 400mg --patient understands you can get over the counter but prefers the prescription to be called in so that she can drive up to window to pick it up---the rx bottle has dr crawford name on it, but I can't find the medication on patient's chart---routing to dr crawford, can you please send this med in for patient?---also, in the past, dr crawford has prescribed prednisone for asthma related breathing/coughing issues for patient---she is having a little more trouble with that right now, she is currently using nebulizers but is still struggling somewhat---patient is afebrile, no other symptoms presenting---patient is requesting refill on prednisone be sent to pharmacy, as well---patient informed dr Sharlet Salina may require video/virtual visit with patient before sending rx---patient's daughter is staying with patient right now and can help with camera/audio labtop capabilities if dr crawford needs visit---routing to dr Sharlet Salina, are you ok with sending requests to pharmacy, or do you need virtual visit?--please advise, I will call patient's daughter back, thanks

## 2018-07-25 NOTE — Progress Notes (Signed)
Virtual Visit via Video Note  I connected with Kayla Thompson on 07/25/18 at  3:20 PM EDT by a video enabled telemedicine application and verified that I am speaking with the correct person using two identifiers.  The patient and the provider were at separate locations throughout the entire encounter.   I discussed the limitations of evaluation and management by telemedicine and the availability of in person appointments. The patient expressed understanding and agreed to proceed.  History of Present Illness: The patient is a 83 y.o. female with visit for SOB and medication confusion. She has been on prednisone for a large amount of years and prior to coming to my care. It was my understanding that this was for either pulmonary reasons or rheumatologic reasons. She has been maintained on 2 mg prednisone daily from pulmonary for some time. She has recently switched pulmonary providers due to retirement of her prior provider and they do not feel she has reason to be on prednisone daily for her pulmonary concerns. They recommended that she consult her pcp about this several months ago. She has not done this. She then states that she does not have a rheumatologist since moving from [place]. She has run out of prednisone in the last several weeks and feels that her breathing is getting worse. She has severe pain from joints and is going to get steroid injections soon. Started several weeks ago. Has some fatigue as well. Trying to socially distance when able and is mostly staying home. She is not getting around well at this time. Some SOB with activity which is somewhat chronic but she feels maybe some worse. Denies fevers or chills. Denies known sick contacts. Overall it is stable. Has tried nothing  Observations/Objective: Appearance: chronically ill, breathing appears stable from prior, casual grooming, abdomen does not appear distended, throat normal, memory stable but mildly impaired, mental status is A and  O times 3  Assessment and Plan: See problem oriented charting  Follow Up Instructions: refill prednisone 2 mg daily for 2 months and urgent referral to rheumatology to see if she has indication for prednisone chronically and if not then we will taper to off  I discussed the assessment and treatment plan with the patient. The patient was provided an opportunity to ask questions and all were answered. The patient agreed with the plan and demonstrated an understanding of the instructions.   The patient was advised to call back or seek an in-person evaluation if the symptoms worsen or if the condition fails to improve as anticipated.  [name], MD   Visit time 25 minutes: greater than 50% of that time was spent in face to face counseling and coordination of care with the patient: counseled about her lungs and chronic lung disease as well as her rheumatologic problems and orthopedic concerns and the role of prednisone in this and that this may no longer be indicated

## 2018-07-25 NOTE — Patient Instructions (Signed)
Take the antibiotic prescribed twice daily for 7 days.  Follow-up if your symptoms persist, worsen or recur.

## 2018-07-26 DIAGNOSIS — M19012 Primary osteoarthritis, left shoulder: Secondary | ICD-10-CM | POA: Diagnosis not present

## 2018-07-26 DIAGNOSIS — M25512 Pain in left shoulder: Secondary | ICD-10-CM | POA: Diagnosis not present

## 2018-07-26 DIAGNOSIS — M25562 Pain in left knee: Secondary | ICD-10-CM | POA: Diagnosis not present

## 2018-07-26 DIAGNOSIS — M1712 Unilateral primary osteoarthritis, left knee: Secondary | ICD-10-CM | POA: Diagnosis not present

## 2018-07-26 NOTE — Assessment & Plan Note (Signed)
She is seeing pulmonary and they do not feel that her lung disease requires prednisone. Will refer to rheum and if no indication for long term therapy will taper as it is fairly unclear to me why this was started years ago and she is not a great historian.

## 2018-07-26 NOTE — Assessment & Plan Note (Signed)
Referral to rheumatology although I felt certain she had seen someone locally within the last few years although she denies. She feels that she has an indication for prednisone although I do not feel confident about this. Her pulmonary provider has recommended stopping prednisone and I would be fine with tapering this as long as rheumatology thinks this is reasonable.

## 2018-08-02 ENCOUNTER — Other Ambulatory Visit: Payer: Self-pay | Admitting: Internal Medicine

## 2018-08-06 ENCOUNTER — Other Ambulatory Visit: Payer: Self-pay | Admitting: Internal Medicine

## 2018-08-07 ENCOUNTER — Telehealth: Payer: Self-pay | Admitting: *Deleted

## 2018-08-07 ENCOUNTER — Other Ambulatory Visit: Payer: Self-pay

## 2018-08-07 ENCOUNTER — Other Ambulatory Visit: Payer: Medicare Other

## 2018-08-07 DIAGNOSIS — N390 Urinary tract infection, site not specified: Secondary | ICD-10-CM

## 2018-08-07 DIAGNOSIS — M25542 Pain in joints of left hand: Secondary | ICD-10-CM | POA: Diagnosis not present

## 2018-08-07 DIAGNOSIS — Z8739 Personal history of other diseases of the musculoskeletal system and connective tissue: Secondary | ICD-10-CM | POA: Diagnosis not present

## 2018-08-07 DIAGNOSIS — M25571 Pain in right ankle and joints of right foot: Secondary | ICD-10-CM | POA: Diagnosis not present

## 2018-08-07 DIAGNOSIS — M25521 Pain in right elbow: Secondary | ICD-10-CM | POA: Diagnosis not present

## 2018-08-07 DIAGNOSIS — M2550 Pain in unspecified joint: Secondary | ICD-10-CM | POA: Diagnosis not present

## 2018-08-07 DIAGNOSIS — M79642 Pain in left hand: Secondary | ICD-10-CM | POA: Diagnosis not present

## 2018-08-07 DIAGNOSIS — M79643 Pain in unspecified hand: Secondary | ICD-10-CM | POA: Diagnosis not present

## 2018-08-07 DIAGNOSIS — M79641 Pain in right hand: Secondary | ICD-10-CM | POA: Diagnosis not present

## 2018-08-07 DIAGNOSIS — M810 Age-related osteoporosis without current pathological fracture: Secondary | ICD-10-CM | POA: Diagnosis not present

## 2018-08-07 DIAGNOSIS — M1990 Unspecified osteoarthritis, unspecified site: Secondary | ICD-10-CM | POA: Diagnosis not present

## 2018-08-07 DIAGNOSIS — M25519 Pain in unspecified shoulder: Secondary | ICD-10-CM | POA: Diagnosis not present

## 2018-08-07 DIAGNOSIS — M25572 Pain in left ankle and joints of left foot: Secondary | ICD-10-CM | POA: Diagnosis not present

## 2018-08-07 DIAGNOSIS — J45909 Unspecified asthma, uncomplicated: Secondary | ICD-10-CM | POA: Diagnosis not present

## 2018-08-07 DIAGNOSIS — M797 Fibromyalgia: Secondary | ICD-10-CM | POA: Diagnosis not present

## 2018-08-07 DIAGNOSIS — M79671 Pain in right foot: Secondary | ICD-10-CM | POA: Diagnosis not present

## 2018-08-07 NOTE — Telephone Encounter (Signed)
Patient coming today for u/a at 1pm, order placed.

## 2018-08-07 NOTE — Telephone Encounter (Signed)
Patient was treated for UTI on 07/25/18 with Macrobid 100 mg x 7 days, completed Rx and symptoms were better, however symptoms did return at the end of the week last week, patient is only c/o burning at end of stream. No other symptoms. She asked if refill should be given or another medication? Please advise.

## 2018-08-07 NOTE — Telephone Encounter (Signed)
I would recommend clean-catch urine for culture and sensitivity.  This way we will know specifically which antibiotic is the best choice.  She does not have to be seen but can drop off the specimen

## 2018-08-08 LAB — CBC AND DIFFERENTIAL
HCT: 38 (ref 36–46)
Hemoglobin: 12.1 (ref 12.0–16.0)
Platelets: 299 (ref 150–399)
WBC: 15.3

## 2018-08-08 LAB — POCT ERYTHROCYTE SEDIMENTATION RATE, NON-AUTOMATED: Sed Rate: 46

## 2018-08-09 ENCOUNTER — Other Ambulatory Visit: Payer: Self-pay | Admitting: Gynecology

## 2018-08-09 ENCOUNTER — Other Ambulatory Visit: Payer: Self-pay | Admitting: Internal Medicine

## 2018-08-09 ENCOUNTER — Telehealth: Payer: Self-pay

## 2018-08-09 NOTE — Telephone Encounter (Signed)
I would probably postpone coming in for the Prolia a week or 2 until the urine returns and if it does show an infection that we treat it.

## 2018-08-09 NOTE — Telephone Encounter (Signed)
Of note, the Prolia inj was scheduled at [facility]. I called but held awhile so I just went in and cancelled it and made a note why.

## 2018-08-09 NOTE — Telephone Encounter (Signed)
Patient informed.  Staff message sent to [facility] to cancel Prolia for tomorrow.

## 2018-08-09 NOTE — Telephone Encounter (Signed)
Patient called to check on urine culture results. Informed still pending.  Patient to have Prolia injection tomorrow and is still having UTI symptoms and wanted to be sure no problem having injection with this going on. "Didn't want to get a fever."

## 2018-08-09 NOTE — Progress Notes (Unsigned)
Tell the patient her urine does look like a UTI.  Assuming she took all of her Macrobid that was prescribed earlier this month I want to change and use a different antibiotic.  Recommend Septra DS 1 p.o. twice daily x5 days.  Follow-up if her symptoms persist or recur.

## 2018-08-10 ENCOUNTER — Telehealth: Payer: Self-pay

## 2018-08-10 ENCOUNTER — Other Ambulatory Visit: Payer: Self-pay | Admitting: *Deleted

## 2018-08-10 ENCOUNTER — Ambulatory Visit: Payer: Medicare Other

## 2018-08-10 LAB — URINALYSIS, COMPLETE W/RFL CULTURE
Bilirubin Urine: NEGATIVE
Glucose, UA: NEGATIVE
Hgb urine dipstick: NEGATIVE
Ketones, ur: NEGATIVE
Nitrites, Initial: NEGATIVE
Specific Gravity, Urine: 1.032 (ref 1.001–1.03)
WBC, UA: >=60 /HPF — AB (ref 0–5)
pH: 8 (ref 5.0–8.0)

## 2018-08-10 LAB — URINE CULTURE
MICRO NUMBER:: 487036
SPECIMEN QUALITY:: ADEQUATE

## 2018-08-10 LAB — CULTURE INDICATED

## 2018-08-10 MED ORDER — SULFAMETHOXAZOLE-TRIMETHOPRIM 800-160 MG PO TABS: 1.0000 | ORAL_TABLET | Freq: Two times a day (BID) | ORAL | 0 refills | Status: DC

## 2018-08-10 MED ORDER — CIPROFLOXACIN HCL 250 MG PO TABS: 250.0000 mg | ORAL_TABLET | Freq: Two times a day (BID) | ORAL | 0 refills | Status: DC

## 2018-08-10 NOTE — Progress Notes (Signed)
Patient did complete all medication of Macrobid, informed with results, Rx sent.

## 2018-08-10 NOTE — Telephone Encounter (Signed)
Patient is having pain in her shoulders and her knees was told that she should see a rheumatologist and would like a referral.

## 2018-08-10 NOTE — Telephone Encounter (Signed)
Called patient back and she stated she already has seen them she was saying that it was recommended she get some sort of therapy and she thought that Dr. Sharlet Salina had to put that in. I informed patient that she may need to call their office back and ask them exactly what they are wanting her to do and if they need Dr. Sharlet Salina to do anything they would have to send Dr. Sharlet Salina those notes and suggestions

## 2018-08-10 NOTE — Telephone Encounter (Signed)
She has already been scheduled and can call [phone] to confirm date and time of appointment.

## 2018-08-10 NOTE — Telephone Encounter (Signed)
Copied from [place] [id number]. Topic: General - Inquiry >> Aug 08, 2018 11:46 AM Richardo Priest, NT wrote: Reason for CRM: Patient is calling stating she would like a call back from [facility] to ask some questions. Call back is [phone].

## 2018-08-11 ENCOUNTER — Ambulatory Visit: Payer: Medicare Other | Admitting: Internal Medicine

## 2018-08-11 ENCOUNTER — Ambulatory Visit: Payer: Self-pay | Admitting: *Deleted

## 2018-08-11 NOTE — Telephone Encounter (Signed)
DOXY visit has been made with Dr.John today 5/22.

## 2018-08-11 NOTE — Telephone Encounter (Signed)
Noted  

## 2018-08-11 NOTE — Telephone Encounter (Signed)
Returned call to patient daughter Kayla Thompson.  She states that she noticed her mother's left eye was  "blood shot" yesterday.  She say that there is no drainage. He mother denies itching and pain to the eye.  The upper lid is slightly swollen. Hx is that her mother has chronic dry eye.  She uses a teardrop eye gtt for the condition. She states that the eye will develop a crust. He mother also coughs a lot from chronic COPD. Her mother is currently being treated for a UTI and will start Cipro tonight. Care advice read to daughter. She verbalized understanding of all instructions. Call transferred to office for appointment.  Reason for Disposition . Bleeding on white of the eye  Answer Assessment - Initial Assessment Questions 1. LOCATION: Location: "What's red, the eyeball or the outer eyelids?" (Note: when callers say the eye is red, they usually mean the sclera is red)       Sclera white part red bllod shot 2. REDNESS OF SCLERA: "Is the redness in one or both eyes?" "When did the redness start?"      Yesterday left eye 3. ONSET: "When did the eye become red?" (e.g., hours, days)      Yesterday 4. EYELIDS: "Are the eyelids red or swollen?" If so, ask: "How much?"      No slight swelling to upper lid 5. VISION: "Is there any difficulty seeing clearly?"      no 6. ITCHING: "Does it feel itchy?" If so ask: "How bad is it" (e.g., Scale 1-10; or mild, moderate, severe)     No 7. PAIN: "Is there any pain? If so, ask: "How bad is it?" (e.g., Scale 1-10; or mild, moderate, severe)    no 8. CONTACT LENS: "Do you wear contacts?"    no 9. CAUSE: "What do you think is causing the redness?"    Dry eye rubbing eye using tears for dry eyes 10. OTHER SYMPTOMS: "Do you have any other symptoms?" (e.g., fever, runny nose, cough, vomiting)       Cough chronic  Protocols used: EYE - RED WITHOUT PUS-A-AH

## 2018-08-12 ENCOUNTER — Ambulatory Visit (INDEPENDENT_AMBULATORY_CARE_PROVIDER_SITE_OTHER): Payer: Medicare Other | Admitting: Family Medicine

## 2018-08-12 ENCOUNTER — Ambulatory Visit: Payer: Medicare Other | Admitting: Family Medicine

## 2018-08-12 ENCOUNTER — Encounter: Payer: Self-pay | Admitting: Family Medicine

## 2018-08-12 ENCOUNTER — Other Ambulatory Visit: Payer: Self-pay

## 2018-08-12 VITALS — Ht 60.0 in | Wt 150.0 lb

## 2018-08-12 DIAGNOSIS — H1132 Conjunctival hemorrhage, left eye: Secondary | ICD-10-CM

## 2018-08-12 NOTE — Progress Notes (Signed)
    Chief Complaint:  Kayla Thompson is a 83 y.o. female who presents today for a virtual office visit with a chief complaint of eye redness.   Assessment/Plan:  Subconjunctival hemorrhage No red flags.  Reassured patient.  Discussed supportive management including cool compresses.  Discussed typical course of illness.  It is painful resolution within the next 2 to 3 weeks.  Discussed warning signs and reasons to return to care/seek emergent care.  Follow-up as needed.    Subjective:  HPI:  Eye Redness Started 2 days ago. She woke up with the symptoms. She has been coughing due to underlying COPD and thinks that this could have triggered her symptoms. No eye pain. No vision changes. Located in left eye. No symptoms in right eye.   ROS: Per HPI  PMH: She reports that she has never smoked. She has never used smokeless tobacco. She reports that she does not drink alcohol or use drugs.      Objective/Observations  Physical Exam: Gen: NAD, resting comfortably Eyes: Left eye with subconjunctival hemorrhage, sparing the limbus.  Extraocular eye movements intact without pain.  Pulm: Normal work of breathing Neuro: Grossly normal, moves all extremities Psych: Normal affect and thought content  Virtual Visit via Video   I connected with Kayla Thompson on 08/12/18 at  9:40 AM EDT by a video enabled telemedicine application and verified that I am speaking with the correct person using two identifiers. I discussed the limitations of evaluation and management by telemedicine and the availability of in person appointments. The patient expressed understanding and agreed to proceed.   Patient location: Home Provider location: [place] participating in the virtual visit: Myself and Patient     Time Spent: I spent  >15 minutes face-to-face with the patient, with more than half spent on counseling for management plan for her subconjunctival hemorrhage.   Algis Greenhouse.  Jerline Pain, MD 08/12/2018 9:50 AM

## 2018-08-15 ENCOUNTER — Telehealth: Payer: Self-pay

## 2018-08-15 NOTE — Telephone Encounter (Signed)
Copied from [place] [id number]. Topic: General - Other >> Aug 15, 2018 11:47 AM Antonieta Iba C wrote: Reason for CRM: pt's daughter [name] called in to speak with assistant in regards to pt's Rheumatology referral.   [name] would like to discuss a few concerns (that she wouldn't like to go into with me) before scheduling a call back   Phone: [phone]

## 2018-08-15 NOTE — Telephone Encounter (Signed)
Called daughter back. Patient went to the rheumatologist and they put in a referral for PT but only for balance and it was a place out by a hospital and patient cannot get out there do to not being mobile    Patient daughter wants to know if you could recommend/reffer patient for PT from [facility] or another place that could come out to the home because patient is immobile and does not drive so is unable to drive out to the place that had called her to get PT for balance. Daughter is wanting PT For right torn rotator cuff and left knee is now bothering her which was told she may have to get surgery in the future on but is not wanting to do surgery now with everything going on and her breathing problems. Daughter is wanting the PT so that patient can become somewhat mobile, patient is either in bed or in a chair most of the day.

## 2018-08-16 NOTE — Telephone Encounter (Signed)
Have they seen anyone for the rotator cuff tear? They should call them for the PT recommendations as I do not treat this and do not know if PT is effective or would help.

## 2018-08-17 ENCOUNTER — Encounter: Payer: Self-pay | Admitting: Internal Medicine

## 2018-08-17 LAB — C REACTIVE PROTEIN, FLUID: CRP: 5

## 2018-08-17 NOTE — Telephone Encounter (Signed)
Left message with patient's daughter to call back to schedule an appointment with Dr Tamala Julian. (possibly virtual)

## 2018-08-17 NOTE — Progress Notes (Signed)
Abstracted and sent to scan  

## 2018-08-17 NOTE — Telephone Encounter (Signed)
Can you please schedule patient with sports med. Thank you call daughter  Talked to Dr. Sharlet Salina and she would like for patient to do at least a virtual if unable to come in to see sports medicine to discuss. States that she does not want to place a referral since she has not seen patient for the rotator cuff before and does not want to do more harm with PT if it is not needed.

## 2018-08-20 NOTE — Progress Notes (Signed)
Virtual Visit via Video Note  I connected with Kayla Thompson on 08/20/18 at  8:30 AM EDT by a video enabled telemedicine application and verified that I am speaking with the correct person using two identifiers.  Location: Patient: home setting  Provider: office setting    I discussed the limitations of evaluation and management by telemedicine and the availability of in person appointments. The patient expressed understanding and agreed to proceed.  History of Present Illness: Patient is complaining of left shoulder pain.  Been told that she does have a rotator cuff tear.  Has had multiple injections by another provider.  Patient has had a dull, throbbing aching pain.  Patient states that she has decreased range of motion.  Patient states that sometimes the pain is noted at night.  They have suggested replacement which patient does not want to have.  Wanting to know what else that can be done.  Past Medical History:  Diagnosis Date  . Arthritis   . Asthma   . Diabetes mellitus without complication ([place])   . Fibromyalgia   . GERD (gastroesophageal reflux disease)   . History of chemotherapy   . History of fractured pelvis   . Hypertension   . Osteoporosis 12/2017   T score -2.8 stable from prior DEXA  . Osteoporosis   . Ovarian cancer [facility])    Past Surgical History:  Procedure Laterality Date  . ABDOMINAL HYSTERECTOMY  1996  . ANKLE FRACTURE SURGERY     plate and screws  . BLADDER SUSPENSION    . CATARACT EXTRACTION    . EYE SURGERY    . hip surgey    . knee surgey    . LUNG SURGERY    . OOPHORECTOMY     BSO   Social History   Socioeconomic History  . Marital status: Widowed    Spouse name: Not on file  . Number of children: 2  . Years of education: Not on file  . Highest education level: Not on file  Occupational History  . Not on file  Social Needs  . Financial resource strain: Not hard at all  . Food insecurity:    Worry: Never true    Inability: Never true   . Transportation needs:    Medical: No    Non-medical: No  Tobacco Use  . Smoking status: Never Smoker  . Smokeless tobacco: Never Used  Substance and Sexual Activity  . Alcohol use: No    Alcohol/week: 0.0 standard drinks  . Drug use: No  . Sexual activity: Not Currently    Comment: declined insuracne questions  Lifestyle  . Physical activity:    Days per week: 0 days    Minutes per session: 0 min  . Stress: Not at all  Relationships  . Social connections:    Talks on phone: More than three times a week    Gets together: More than three times a week    Attends religious service: 1 to 4 times per year    Active member of club or organization: Yes    Attends meetings of clubs or organizations: 1 to 4 times per year    Relationship status: Widowed  . Intimate partner violence:    Fear of current or ex partner: Not on file    Emotionally abused: Not on file    Physically abused: Not on file    Forced sexual activity: Not on file  Other Topics Concern  . Not on file  Social  History Narrative  . Not on file       Observations/Objective: Patient appears to have a significant weakness with a high riding glenohumeral head on the left side.  Patient is unable to lift the arm greater than 45 degrees.   Assessment and Plan: Rotator cuff arthropathy left side.  Will send patient email over-the-counter suggestions for different medications, we discussed range of motion exercises, icing regimen, patient does not want try these things to help with some of the quality of life.  Follow-up with me either virtually or in office in 4 to 6 weeks.  Discussed other injection options.  Follow Up Instructions:    I discussed the assessment and treatment plan with the patient. The patient was provided an opportunity to ask questions and all were answered. The patient agreed with the plan and demonstrated an understanding of the instructions.   The patient was advised to call back or seek an  in-person evaluation if the symptoms worsen or if the condition fails to improve as anticipated.  I provided 41 minutes of face-to-face time during this encounter.   Lyndal Pulley, DO

## 2018-08-21 ENCOUNTER — Other Ambulatory Visit: Payer: Self-pay | Admitting: Pulmonary Disease

## 2018-08-21 ENCOUNTER — Encounter: Payer: Self-pay | Admitting: Family Medicine

## 2018-08-21 ENCOUNTER — Ambulatory Visit (INDEPENDENT_AMBULATORY_CARE_PROVIDER_SITE_OTHER): Payer: Medicare Other | Admitting: Family Medicine

## 2018-08-21 DIAGNOSIS — M19012 Primary osteoarthritis, left shoulder: Secondary | ICD-10-CM

## 2018-08-30 ENCOUNTER — Ambulatory Visit (INDEPENDENT_AMBULATORY_CARE_PROVIDER_SITE_OTHER): Payer: Medicare Other

## 2018-08-30 ENCOUNTER — Other Ambulatory Visit: Payer: Self-pay | Admitting: *Deleted

## 2018-08-30 ENCOUNTER — Telehealth: Payer: Self-pay

## 2018-08-30 DIAGNOSIS — M810 Age-related osteoporosis without current pathological fracture: Secondary | ICD-10-CM | POA: Diagnosis not present

## 2018-08-30 DIAGNOSIS — G8929 Other chronic pain: Secondary | ICD-10-CM

## 2018-08-30 MED ORDER — DENOSUMAB 60 MG/ML ~~LOC~~ SOSY
60.0000 mg | PREFILLED_SYRINGE | Freq: Once | SUBCUTANEOUS | Status: AC
  Administered 2018-08-30: 60 mg via SUBCUTANEOUS

## 2018-08-30 NOTE — Telephone Encounter (Signed)
lvm advising that [facility] should be calling patient or patient's daughter to schedule home health PT (from referral placed by dr smith)---can talk with Kayla Arney,RN at [facility] office if needed

## 2018-08-31 NOTE — Progress Notes (Signed)
Medical treatment/procedure(s) were performed by non-physician practitioner and as supervising physician I was immediately available for consultation/collaboration. I agree with above. Keylin Podolsky A Aanya Haynes, MD  

## 2018-09-01 ENCOUNTER — Telehealth: Payer: Self-pay

## 2018-09-01 DIAGNOSIS — Z8543 Personal history of malignant neoplasm of ovary: Secondary | ICD-10-CM | POA: Diagnosis not present

## 2018-09-01 DIAGNOSIS — E785 Hyperlipidemia, unspecified: Secondary | ICD-10-CM | POA: Diagnosis not present

## 2018-09-01 DIAGNOSIS — M25562 Pain in left knee: Secondary | ICD-10-CM | POA: Diagnosis not present

## 2018-09-01 DIAGNOSIS — Z7951 Long term (current) use of inhaled steroids: Secondary | ICD-10-CM | POA: Diagnosis not present

## 2018-09-01 DIAGNOSIS — I5032 Chronic diastolic (congestive) heart failure: Secondary | ICD-10-CM | POA: Diagnosis not present

## 2018-09-01 DIAGNOSIS — E039 Hypothyroidism, unspecified: Secondary | ICD-10-CM | POA: Diagnosis not present

## 2018-09-01 DIAGNOSIS — M797 Fibromyalgia: Secondary | ICD-10-CM | POA: Diagnosis not present

## 2018-09-01 DIAGNOSIS — Z794 Long term (current) use of insulin: Secondary | ICD-10-CM | POA: Diagnosis not present

## 2018-09-01 DIAGNOSIS — M1990 Unspecified osteoarthritis, unspecified site: Secondary | ICD-10-CM | POA: Diagnosis not present

## 2018-09-01 DIAGNOSIS — I110 Hypertensive heart disease with heart failure: Secondary | ICD-10-CM | POA: Diagnosis not present

## 2018-09-01 DIAGNOSIS — K219 Gastro-esophageal reflux disease without esophagitis: Secondary | ICD-10-CM | POA: Diagnosis not present

## 2018-09-01 DIAGNOSIS — J479 Bronchiectasis, uncomplicated: Secondary | ICD-10-CM | POA: Diagnosis not present

## 2018-09-01 DIAGNOSIS — Z7952 Long term (current) use of systemic steroids: Secondary | ICD-10-CM | POA: Diagnosis not present

## 2018-09-01 DIAGNOSIS — M25561 Pain in right knee: Secondary | ICD-10-CM | POA: Diagnosis not present

## 2018-09-01 DIAGNOSIS — J8410 Pulmonary fibrosis, unspecified: Secondary | ICD-10-CM | POA: Diagnosis not present

## 2018-09-01 DIAGNOSIS — M75102 Unspecified rotator cuff tear or rupture of left shoulder, not specified as traumatic: Secondary | ICD-10-CM | POA: Diagnosis not present

## 2018-09-01 DIAGNOSIS — Z9981 Dependence on supplemental oxygen: Secondary | ICD-10-CM | POA: Diagnosis not present

## 2018-09-01 DIAGNOSIS — F411 Generalized anxiety disorder: Secondary | ICD-10-CM | POA: Diagnosis not present

## 2018-09-01 DIAGNOSIS — E119 Type 2 diabetes mellitus without complications: Secondary | ICD-10-CM | POA: Diagnosis not present

## 2018-09-01 DIAGNOSIS — M353 Polymyalgia rheumatica: Secondary | ICD-10-CM | POA: Diagnosis not present

## 2018-09-01 DIAGNOSIS — M810 Age-related osteoporosis without current pathological fracture: Secondary | ICD-10-CM | POA: Diagnosis not present

## 2018-09-01 NOTE — Telephone Encounter (Signed)
Verbals given  

## 2018-09-01 NOTE — Telephone Encounter (Signed)
Fine

## 2018-09-01 NOTE — Telephone Encounter (Signed)
Copied from [place] [id number]. Topic: Quick Communication - Home Health Verbal Orders >> Sep 01, 2018  3:23 PM Virl Axe D wrote: Caller/Agency: [facility] PT Callback Number: [phone] / Secure VM Requesting OT/PT/Skilled Nursing/Social Work/Speech Therapy: PT Frequency: 2 week 4 / 1 week 2

## 2018-09-02 ENCOUNTER — Other Ambulatory Visit: Payer: Self-pay | Admitting: Internal Medicine

## 2018-09-04 ENCOUNTER — Other Ambulatory Visit: Payer: Self-pay | Admitting: Pulmonary Disease

## 2018-09-04 ENCOUNTER — Telehealth: Payer: Self-pay | Admitting: Pulmonary Disease

## 2018-09-04 NOTE — Telephone Encounter (Signed)
Returned call to patient.  Patient states she would like to switch her care from Dr. Valeta Harms to Dr. Annamaria Boots.  She was a former patient of Dr. Lenna Gilford and feels she would be more comfortable seeing Dr. Annamaria Boots stating she feels her health decisions would be more aligned with him as she has consulted with other patients under his care.    Advised patient we would need both physicians to agree to a change in providers and we will notify her once a determination is made.  Patient acknowledged understanding and would like to set up an appointment when we contact her regarding this message.  Routed message to both physicians for review.  Dr. Valeta Harms would you be willing to make this transition to Dr. Annamaria Boots if he is willing to accept this patient? Dr. Annamaria Boots are you able to accept a new patient?

## 2018-09-04 NOTE — Telephone Encounter (Signed)
Ok with me 

## 2018-09-05 ENCOUNTER — Telehealth: Payer: Self-pay | Admitting: *Deleted

## 2018-09-05 DIAGNOSIS — I110 Hypertensive heart disease with heart failure: Secondary | ICD-10-CM | POA: Diagnosis not present

## 2018-09-05 DIAGNOSIS — M1990 Unspecified osteoarthritis, unspecified site: Secondary | ICD-10-CM | POA: Diagnosis not present

## 2018-09-05 DIAGNOSIS — I5032 Chronic diastolic (congestive) heart failure: Secondary | ICD-10-CM | POA: Diagnosis not present

## 2018-09-05 DIAGNOSIS — M75102 Unspecified rotator cuff tear or rupture of left shoulder, not specified as traumatic: Secondary | ICD-10-CM | POA: Diagnosis not present

## 2018-09-05 DIAGNOSIS — M25561 Pain in right knee: Secondary | ICD-10-CM | POA: Diagnosis not present

## 2018-09-05 DIAGNOSIS — M25562 Pain in left knee: Secondary | ICD-10-CM | POA: Diagnosis not present

## 2018-09-05 NOTE — Telephone Encounter (Signed)
Dr. Valeta Harms, please advise if you are fine with pt switching from you to Dr. Annamaria Boots. Thank you!

## 2018-09-05 NOTE — Telephone Encounter (Signed)
[name] called stating that her mother fell last Saturday night & landed on her butt. She is not complaining of any pain but the daughter stated this has happened previously & she has had fractures. [name] would like to know if they need to come into the office for x-rays or if they should schedule a virtual.

## 2018-09-05 NOTE — Telephone Encounter (Signed)
Yes ok with me Garner Nash, DO [facility] 09/05/2018 9:22 PM

## 2018-09-05 NOTE — Telephone Encounter (Signed)
Lets give it a day then if any pain we can start with a virtual and maybe xray

## 2018-09-06 DIAGNOSIS — M1990 Unspecified osteoarthritis, unspecified site: Secondary | ICD-10-CM | POA: Diagnosis not present

## 2018-09-06 DIAGNOSIS — M25519 Pain in unspecified shoulder: Secondary | ICD-10-CM | POA: Diagnosis not present

## 2018-09-06 DIAGNOSIS — M810 Age-related osteoporosis without current pathological fracture: Secondary | ICD-10-CM | POA: Diagnosis not present

## 2018-09-06 DIAGNOSIS — M2550 Pain in unspecified joint: Secondary | ICD-10-CM | POA: Diagnosis not present

## 2018-09-06 DIAGNOSIS — M797 Fibromyalgia: Secondary | ICD-10-CM | POA: Diagnosis not present

## 2018-09-06 DIAGNOSIS — M79643 Pain in unspecified hand: Secondary | ICD-10-CM | POA: Diagnosis not present

## 2018-09-06 DIAGNOSIS — Z8739 Personal history of other diseases of the musculoskeletal system and connective tissue: Secondary | ICD-10-CM | POA: Diagnosis not present

## 2018-09-06 DIAGNOSIS — Z7952 Long term (current) use of systemic steroids: Secondary | ICD-10-CM | POA: Diagnosis not present

## 2018-09-06 NOTE — Telephone Encounter (Signed)
Scheduled pt for a virtual at 12:15pm

## 2018-09-06 NOTE — Telephone Encounter (Signed)
Pt has been scheduled with CY on held spot per his ok on 7/15 @ 11:30. Nothing further needed.

## 2018-09-06 NOTE — Telephone Encounter (Signed)
Yes

## 2018-09-06 NOTE — Telephone Encounter (Signed)
Kayla Thompson's next available is not until 11/01/2018.  Kayla Thompson - is it okay to use one of your held spots?

## 2018-09-08 ENCOUNTER — Ambulatory Visit (INDEPENDENT_AMBULATORY_CARE_PROVIDER_SITE_OTHER): Payer: Medicare Other | Admitting: Family Medicine

## 2018-09-08 ENCOUNTER — Other Ambulatory Visit: Payer: Self-pay

## 2018-09-08 ENCOUNTER — Encounter: Payer: Self-pay | Admitting: Family Medicine

## 2018-09-08 DIAGNOSIS — M25562 Pain in left knee: Secondary | ICD-10-CM | POA: Diagnosis not present

## 2018-09-08 DIAGNOSIS — M75102 Unspecified rotator cuff tear or rupture of left shoulder, not specified as traumatic: Secondary | ICD-10-CM | POA: Diagnosis not present

## 2018-09-08 DIAGNOSIS — M797 Fibromyalgia: Secondary | ICD-10-CM

## 2018-09-08 DIAGNOSIS — I5032 Chronic diastolic (congestive) heart failure: Secondary | ICD-10-CM | POA: Diagnosis not present

## 2018-09-08 DIAGNOSIS — M353 Polymyalgia rheumatica: Secondary | ICD-10-CM | POA: Diagnosis not present

## 2018-09-08 DIAGNOSIS — M818 Other osteoporosis without current pathological fracture: Secondary | ICD-10-CM

## 2018-09-08 DIAGNOSIS — I110 Hypertensive heart disease with heart failure: Secondary | ICD-10-CM | POA: Diagnosis not present

## 2018-09-08 DIAGNOSIS — M25561 Pain in right knee: Secondary | ICD-10-CM | POA: Diagnosis not present

## 2018-09-08 DIAGNOSIS — M1990 Unspecified osteoarthritis, unspecified site: Secondary | ICD-10-CM | POA: Diagnosis not present

## 2018-09-08 NOTE — Progress Notes (Signed)
Virtual Visit via Video Note  I connected with Kayla Thompson on 09/08/18 at 12:15 PM EDT by a video enabled telemedicine application and verified that I am speaking with the correct person using two identifiers.  Location: Patient: Patient was in her home setting with her daughter.  Provider: I was in office setting.   I discussed the limitations of evaluation and management by telemedicine and the availability of in person appointments. The patient expressed understanding and agreed to proceed.  History of Present Illness: Patient is an 83 year old female with a history of significant number of falls status post hip and pelvic fractures from 2019 who recently had another fall.  Was walking with her walker and unfortunately fell backwards.  Patient hit her bottom but did not hit her head.  No loss of consciousness.  This was a witnessed fall.  Patient states that she was a little sore for a couple days but is improving.  Patient's daughter is more concerned because she did have a history of a pelvic fracture that went 1 month before diagnosis.  Though when I reviewed the chart showed  that she was having significant amount of weakness of the lower extremities.  Patient denies that she is having any of that at this time Patient denies any radiation down the leg, any numbness or tingling.  Patient does feel that the fibromyalgia is worsening recently.   Observations/Objective: Patient is alert and oriented.  Fairly good historian   Assessment and Plan: Recent fall with a elderly patient who is on narcotics.  Patient denies any type of breathing, patient's family is on the phone as well and did not notice any significant change.  They are concerned because of the patient's history of a insufficiency fracture previously.  Discussed with them at this point in signs and symptoms and what to watch for but I do not feel that imaging is necessary at this time with someone who is not having any difficulty  with activities of daily living since the fall.  Patient did ask for a refill of the tramadol but encouraged her to get this from the individual provider who is prescribing this.  Follow-up with me again in 2 weeks.      I discussed the assessment and treatment plan with the patient. The patient was provided an opportunity to ask questions and all were answered. The patient agreed with the plan and demonstrated an understanding of the instructions.   The patient was advised to call back or seek an in-person evaluation if the symptoms worsen or if the condition fails to improve as anticipated.  I provided 26 minutes of face-to-face time during this encounter.   Lyndal Pulley, DO

## 2018-09-11 DIAGNOSIS — M75102 Unspecified rotator cuff tear or rupture of left shoulder, not specified as traumatic: Secondary | ICD-10-CM | POA: Diagnosis not present

## 2018-09-11 DIAGNOSIS — I110 Hypertensive heart disease with heart failure: Secondary | ICD-10-CM | POA: Diagnosis not present

## 2018-09-11 DIAGNOSIS — M1990 Unspecified osteoarthritis, unspecified site: Secondary | ICD-10-CM | POA: Diagnosis not present

## 2018-09-11 DIAGNOSIS — M25562 Pain in left knee: Secondary | ICD-10-CM | POA: Diagnosis not present

## 2018-09-11 DIAGNOSIS — M25561 Pain in right knee: Secondary | ICD-10-CM | POA: Diagnosis not present

## 2018-09-11 DIAGNOSIS — I5032 Chronic diastolic (congestive) heart failure: Secondary | ICD-10-CM | POA: Diagnosis not present

## 2018-09-13 ENCOUNTER — Telehealth: Payer: Self-pay | Admitting: Internal Medicine

## 2018-09-13 NOTE — Telephone Encounter (Signed)
Pt will not have 2nd  PT as scheduled this week will not be done..please move to end of episode, verbal to OK.  [phone] Kayla Thompson  //[facility]

## 2018-09-14 ENCOUNTER — Other Ambulatory Visit: Payer: Self-pay | Admitting: Internal Medicine

## 2018-09-18 DIAGNOSIS — M75102 Unspecified rotator cuff tear or rupture of left shoulder, not specified as traumatic: Secondary | ICD-10-CM | POA: Diagnosis not present

## 2018-09-18 DIAGNOSIS — I5032 Chronic diastolic (congestive) heart failure: Secondary | ICD-10-CM | POA: Diagnosis not present

## 2018-09-18 DIAGNOSIS — M25561 Pain in right knee: Secondary | ICD-10-CM | POA: Diagnosis not present

## 2018-09-18 DIAGNOSIS — M1990 Unspecified osteoarthritis, unspecified site: Secondary | ICD-10-CM | POA: Diagnosis not present

## 2018-09-18 DIAGNOSIS — I110 Hypertensive heart disease with heart failure: Secondary | ICD-10-CM | POA: Diagnosis not present

## 2018-09-18 DIAGNOSIS — M25562 Pain in left knee: Secondary | ICD-10-CM | POA: Diagnosis not present

## 2018-09-19 ENCOUNTER — Other Ambulatory Visit: Payer: Self-pay | Admitting: Internal Medicine

## 2018-09-20 DIAGNOSIS — M25562 Pain in left knee: Secondary | ICD-10-CM | POA: Diagnosis not present

## 2018-09-20 DIAGNOSIS — M75102 Unspecified rotator cuff tear or rupture of left shoulder, not specified as traumatic: Secondary | ICD-10-CM | POA: Diagnosis not present

## 2018-09-20 DIAGNOSIS — M1990 Unspecified osteoarthritis, unspecified site: Secondary | ICD-10-CM | POA: Diagnosis not present

## 2018-09-20 DIAGNOSIS — M25561 Pain in right knee: Secondary | ICD-10-CM | POA: Diagnosis not present

## 2018-09-20 DIAGNOSIS — I110 Hypertensive heart disease with heart failure: Secondary | ICD-10-CM | POA: Diagnosis not present

## 2018-09-20 DIAGNOSIS — I5032 Chronic diastolic (congestive) heart failure: Secondary | ICD-10-CM | POA: Diagnosis not present

## 2018-09-20 NOTE — Telephone Encounter (Signed)
Done

## 2018-09-21 ENCOUNTER — Other Ambulatory Visit: Payer: Self-pay

## 2018-09-21 MED ORDER — BACLOFEN 10 MG PO TABS: ORAL_TABLET | ORAL | 1 refills | Status: DC

## 2018-09-25 DIAGNOSIS — I110 Hypertensive heart disease with heart failure: Secondary | ICD-10-CM | POA: Diagnosis not present

## 2018-09-25 DIAGNOSIS — M25561 Pain in right knee: Secondary | ICD-10-CM | POA: Diagnosis not present

## 2018-09-25 DIAGNOSIS — M1990 Unspecified osteoarthritis, unspecified site: Secondary | ICD-10-CM | POA: Diagnosis not present

## 2018-09-25 DIAGNOSIS — M75102 Unspecified rotator cuff tear or rupture of left shoulder, not specified as traumatic: Secondary | ICD-10-CM | POA: Diagnosis not present

## 2018-09-25 DIAGNOSIS — I5032 Chronic diastolic (congestive) heart failure: Secondary | ICD-10-CM | POA: Diagnosis not present

## 2018-09-25 DIAGNOSIS — M25562 Pain in left knee: Secondary | ICD-10-CM | POA: Diagnosis not present

## 2018-09-26 ENCOUNTER — Encounter: Payer: Self-pay | Admitting: Internal Medicine

## 2018-09-26 ENCOUNTER — Ambulatory Visit (INDEPENDENT_AMBULATORY_CARE_PROVIDER_SITE_OTHER): Payer: Medicare Other | Admitting: Internal Medicine

## 2018-09-26 DIAGNOSIS — G4700 Insomnia, unspecified: Secondary | ICD-10-CM | POA: Insufficient documentation

## 2018-09-26 DIAGNOSIS — F5101 Primary insomnia: Secondary | ICD-10-CM

## 2018-09-26 MED ORDER — AMITRIPTYLINE HCL 10 MG PO TABS: 10.0000 mg | ORAL_TABLET | Freq: Every day | ORAL | 1 refills | Status: DC

## 2018-09-26 NOTE — Progress Notes (Signed)
Virtual Visit via Audio Note  I connected with Kayla Thompson on 09/26/18 at  2:20 PM EDT by an audio-only enabled telemedicine application and verified that I am speaking with the correct person using two identifiers.  The patient and the provider were at separate locations throughout the entire encounter.   I discussed the limitations of evaluation and management by telemedicine and the availability of in person appointments. The patient expressed understanding and agreed to proceed.  History of Present Illness: The patient is a 83 y.o. female with visit for insomnia. Started weeks ago. Has not been sleeping well at night. Denies medication changes. Overall it is worsening. Has tried melatonin. Not sleeping until 5-6am sleeping about 2 hours. Sleeping only about another hour during the day. Has tried melatonin over the counter which did not help at all. She has had sleep problems in the past and used amitriptyline in the past which has helped.   Observations/Objective: Voice strong, A and O times 3, no cough during visit  Assessment and Plan: See problem oriented charting  Follow Up Instructions: rx amitriptyline 10 mg nightly  Visit time 11 minutes: that time was spent in face to face counseling and coordination of care with the patient: counseled about as above  I discussed the assessment and treatment plan with the patient. The patient was provided an opportunity to ask questions and all were answered. The patient agreed with the plan and demonstrated an understanding of the instructions.   The patient was advised to call back or seek an in-person evaluation if the symptoms worsen or if the condition fails to improve as anticipated.  [name], MD

## 2018-09-26 NOTE — Assessment & Plan Note (Signed)
Sounds to be sleep disorder with some switching of night and day. Rx amitriptyline to help adjust.

## 2018-09-27 DIAGNOSIS — M25562 Pain in left knee: Secondary | ICD-10-CM | POA: Diagnosis not present

## 2018-09-27 DIAGNOSIS — I110 Hypertensive heart disease with heart failure: Secondary | ICD-10-CM | POA: Diagnosis not present

## 2018-09-27 DIAGNOSIS — I5032 Chronic diastolic (congestive) heart failure: Secondary | ICD-10-CM | POA: Diagnosis not present

## 2018-09-27 DIAGNOSIS — M75102 Unspecified rotator cuff tear or rupture of left shoulder, not specified as traumatic: Secondary | ICD-10-CM | POA: Diagnosis not present

## 2018-09-27 DIAGNOSIS — M1990 Unspecified osteoarthritis, unspecified site: Secondary | ICD-10-CM | POA: Diagnosis not present

## 2018-09-27 DIAGNOSIS — M25561 Pain in right knee: Secondary | ICD-10-CM | POA: Diagnosis not present

## 2018-09-29 DIAGNOSIS — Z794 Long term (current) use of insulin: Secondary | ICD-10-CM | POA: Diagnosis not present

## 2018-09-29 DIAGNOSIS — Z961 Presence of intraocular lens: Secondary | ICD-10-CM | POA: Diagnosis not present

## 2018-09-29 DIAGNOSIS — E119 Type 2 diabetes mellitus without complications: Secondary | ICD-10-CM | POA: Diagnosis not present

## 2018-09-29 LAB — HM DIABETES EYE EXAM

## 2018-10-01 DIAGNOSIS — M797 Fibromyalgia: Secondary | ICD-10-CM | POA: Diagnosis not present

## 2018-10-01 DIAGNOSIS — M810 Age-related osteoporosis without current pathological fracture: Secondary | ICD-10-CM | POA: Diagnosis not present

## 2018-10-01 DIAGNOSIS — I5032 Chronic diastolic (congestive) heart failure: Secondary | ICD-10-CM | POA: Diagnosis not present

## 2018-10-01 DIAGNOSIS — M25562 Pain in left knee: Secondary | ICD-10-CM | POA: Diagnosis not present

## 2018-10-01 DIAGNOSIS — E119 Type 2 diabetes mellitus without complications: Secondary | ICD-10-CM | POA: Diagnosis not present

## 2018-10-01 DIAGNOSIS — M25561 Pain in right knee: Secondary | ICD-10-CM | POA: Diagnosis not present

## 2018-10-01 DIAGNOSIS — Z9981 Dependence on supplemental oxygen: Secondary | ICD-10-CM | POA: Diagnosis not present

## 2018-10-01 DIAGNOSIS — K219 Gastro-esophageal reflux disease without esophagitis: Secondary | ICD-10-CM | POA: Diagnosis not present

## 2018-10-01 DIAGNOSIS — Z7952 Long term (current) use of systemic steroids: Secondary | ICD-10-CM | POA: Diagnosis not present

## 2018-10-01 DIAGNOSIS — M75102 Unspecified rotator cuff tear or rupture of left shoulder, not specified as traumatic: Secondary | ICD-10-CM | POA: Diagnosis not present

## 2018-10-01 DIAGNOSIS — Z794 Long term (current) use of insulin: Secondary | ICD-10-CM | POA: Diagnosis not present

## 2018-10-01 DIAGNOSIS — J8410 Pulmonary fibrosis, unspecified: Secondary | ICD-10-CM | POA: Diagnosis not present

## 2018-10-01 DIAGNOSIS — I110 Hypertensive heart disease with heart failure: Secondary | ICD-10-CM | POA: Diagnosis not present

## 2018-10-01 DIAGNOSIS — E039 Hypothyroidism, unspecified: Secondary | ICD-10-CM | POA: Diagnosis not present

## 2018-10-01 DIAGNOSIS — M353 Polymyalgia rheumatica: Secondary | ICD-10-CM | POA: Diagnosis not present

## 2018-10-01 DIAGNOSIS — Z8543 Personal history of malignant neoplasm of ovary: Secondary | ICD-10-CM | POA: Diagnosis not present

## 2018-10-01 DIAGNOSIS — J479 Bronchiectasis, uncomplicated: Secondary | ICD-10-CM | POA: Diagnosis not present

## 2018-10-01 DIAGNOSIS — Z7951 Long term (current) use of inhaled steroids: Secondary | ICD-10-CM | POA: Diagnosis not present

## 2018-10-01 DIAGNOSIS — F411 Generalized anxiety disorder: Secondary | ICD-10-CM | POA: Diagnosis not present

## 2018-10-01 DIAGNOSIS — E785 Hyperlipidemia, unspecified: Secondary | ICD-10-CM | POA: Diagnosis not present

## 2018-10-01 DIAGNOSIS — M1990 Unspecified osteoarthritis, unspecified site: Secondary | ICD-10-CM | POA: Diagnosis not present

## 2018-10-02 DIAGNOSIS — M75102 Unspecified rotator cuff tear or rupture of left shoulder, not specified as traumatic: Secondary | ICD-10-CM | POA: Diagnosis not present

## 2018-10-02 DIAGNOSIS — M1990 Unspecified osteoarthritis, unspecified site: Secondary | ICD-10-CM | POA: Diagnosis not present

## 2018-10-02 DIAGNOSIS — I110 Hypertensive heart disease with heart failure: Secondary | ICD-10-CM | POA: Diagnosis not present

## 2018-10-02 DIAGNOSIS — I5032 Chronic diastolic (congestive) heart failure: Secondary | ICD-10-CM | POA: Diagnosis not present

## 2018-10-02 DIAGNOSIS — M25562 Pain in left knee: Secondary | ICD-10-CM | POA: Diagnosis not present

## 2018-10-02 DIAGNOSIS — M25561 Pain in right knee: Secondary | ICD-10-CM | POA: Diagnosis not present

## 2018-10-03 ENCOUNTER — Encounter: Payer: Medicare Other | Admitting: Family Medicine

## 2018-10-03 NOTE — Progress Notes (Signed)
Virtual Visit via Video Note  patient no show

## 2018-10-04 ENCOUNTER — Ambulatory Visit (INDEPENDENT_AMBULATORY_CARE_PROVIDER_SITE_OTHER): Payer: Medicare Other

## 2018-10-04 ENCOUNTER — Telehealth: Payer: Self-pay | Admitting: Internal Medicine

## 2018-10-04 ENCOUNTER — Encounter: Payer: Self-pay | Admitting: Internal Medicine

## 2018-10-04 ENCOUNTER — Ambulatory Visit (INDEPENDENT_AMBULATORY_CARE_PROVIDER_SITE_OTHER): Payer: Medicare Other | Admitting: Internal Medicine

## 2018-10-04 ENCOUNTER — Other Ambulatory Visit: Payer: Self-pay

## 2018-10-04 VITALS — BP 118/78 | HR 93 | Temp 98.5°F | Ht 60.0 in | Wt 148.6 lb

## 2018-10-04 DIAGNOSIS — J479 Bronchiectasis, uncomplicated: Secondary | ICD-10-CM | POA: Diagnosis not present

## 2018-10-04 DIAGNOSIS — J449 Chronic obstructive pulmonary disease, unspecified: Secondary | ICD-10-CM

## 2018-10-04 DIAGNOSIS — I5032 Chronic diastolic (congestive) heart failure: Secondary | ICD-10-CM | POA: Diagnosis not present

## 2018-10-04 DIAGNOSIS — J398 Other specified diseases of upper respiratory tract: Secondary | ICD-10-CM

## 2018-10-04 MED ORDER — SPIRIVA HANDIHALER 18 MCG IN CAPS: ORAL_CAPSULE | RESPIRATORY_TRACT | 12 refills | Status: DC

## 2018-10-04 MED ORDER — FLUTICASONE-SALMETEROL 500-50 MCG/DOSE IN AEPB: 1.0000 | INHALATION_SPRAY | Freq: Two times a day (BID) | RESPIRATORY_TRACT | 12 refills | Status: DC

## 2018-10-04 NOTE — Telephone Encounter (Signed)
Fine

## 2018-10-04 NOTE — Assessment & Plan Note (Signed)
Not in obvious CHF at this visit but recognize potential symptom overlap with her lung disease. She will maintain contact with her PCP.

## 2018-10-04 NOTE — Assessment & Plan Note (Signed)
Previous pneumonias, likely aspiration. Plan- Schedule PFT, Flutter valve, refill Advair and Spiriva

## 2018-10-04 NOTE — Progress Notes (Signed)
10/04/2018-  83 yo F never smoker seeing me to establish for pulmonary care. former SN pt, referred by Dr. Sharlet Salina (PCP) for asthma and COPD, reports increased DOE Medical problem list includes HBP, dCHF, Tracheobronchomalacia, Granulomatous lung disease, Bronchiectasis, Asthma, GERD, DM2, Hypothyroid, Hyperparathyroidism, Osteoporosis, PMR,  Dr Valeta Harms: "PMH of asthma dx 20+ years ago, takes advair 500/50 and as needed albuterol. Started on chronic prednisone 2mg  every day for the past 20 years. Started by Dr. Lollie Marrow in [place].  She has been followed by Dr. Lenna Gilford for ongoing shortness of breath and her asthma.  She is currently managed with Advair and Spiriva.  Prior pulmonary function tests with no evidence of obstruction.  She does however have cylindric and varicoid bronchiectasis on CT imaging.  Her last axial CT imaging was in 2016.  She did have a episode of pneumonia in July 2019.  She was treated with antibiotics during this time and had a follow-up chest imaging with an x-ray in August 2019 which showed resolution.  She was seen by 1 of our nurse practitioners, Lazaro Arms a few weeks ago.  She was treated for a bronchiectasis exacerbation as she complained of chest tightness wheezing and congestion.  She got antibiotics and steroids to include Doxy and Medrol Dosepak but never took any of the medications.  She said she started to feel better after she left the office." -----former SN pt, referred by Dr. Sharlet Salina (PCP) for asthma and COPD, reports increased DOE She wishes to establish with me for pulmonary care, and tells me she is at baseline. Routine cough, usually worst in the morning then minor through the day. Clear to darker nonbloody sputum, usually scant. Has not had a fluttervalve or pneumatic vest. Dyspnea walking, relieved by rest. O2 2L/ [employer]- Has oxygen concentrator and POC. Doesn't use concentrator much due to loud, causes headaches.  Rescue inhaler 1-2x/ week, Spiriva, Advair 500 and  maintenance prednisone routinely, Nebulizer 2-3x daily. Pending ENT for swallowing difficulty.  Pending L shoulder surgery. Office Spirometry 01/07/15- Mild obstructive airways disease- FVC 7.2/ 100%, FEV1 1.4/ 83%, FEV1/FVC 0.62, FEF25-75% 44% CXR 11/07/17- IMPRESSION: Resolved LEFT LOWER lung pneumonia.  No acute abnormality.  Prior to Admission medications   Medication Sig Start Date End Date Taking? Authorizing Provider  albuterol (PROVENTIL HFA;VENTOLIN HFA) 108 (90 Base) MCG/ACT inhaler Inhale 2 puffs into the lungs every 6 (six) hours as needed for wheezing or shortness of breath. 09/19/17  Yes Noralee Space, MD  amitriptyline (ELAVIL) 10 MG tablet Take 1 tablet (10 mg total) by mouth at bedtime. 09/26/18  Yes [name], MD  baclofen (LIORESAL) 10 MG tablet TAKE 0.5-1 TABLET BY MOUTH TWICE DAILY AS NEEDED FOR MUSCLE SPASMS OR PAIN 09/21/18  Yes [name], MD  BD PEN NEEDLE NANO U/F 32G X 4 MM MISC USE 4 TIMES A DAY 08/07/18  Yes [name], MD  busPIRone (BUSPAR) 5 MG tablet TAKE 1 TO 2 TABLETS BY MOUTH TWICE A DAY 05/11/18  Yes [name], MD  calcium carbonate (OSCAL) 1500 (600 Ca) MG TABS tablet Take 1,500 mg by mouth daily.   Yes [provider]  carvedilol (COREG) 6.25 MG tablet TAKE 1/2 TABLET BY MOUTH 2 (TWO) TIMES DAILY WITH A MEAL. 06/05/18  Yes [name], MD  Cholecalciferol (VITAMIN D3) 2000 units TABS Take 2 tablets by mouth daily.    Yes [provider]  denosumab (PROLIA) 60 MG/ML SOLN injection Inject 60 mg into the skin  every 6 (six) months. Administer in upper arm, thigh, or abdomen   Yes [provider]  Dextromethorphan-Guaifenesin (MUCINEX DM MAXIMUM STRENGTH) 60-1200 MG TB12 Take 1 tablet by mouth 2 (two) times daily. Reported on 06/23/2015   Yes [provider]  donepezil (ARICEPT) 5 MG tablet TAKE 1 TABLET BY MOUTH EVERYDAY AT BEDTIME 05/19/18  Yes [provider]  DULoxetine  (CYMBALTA) 30 MG capsule Take 1 capsule (30 mg total) by mouth daily. Patient taking differently: Take 30 mg by mouth daily. 90 mg daily 12/06/17  Yes [name], MD  fluticasone [facility]) 50 MCG/ACT nasal spray Place 2 sprays into both nostrils daily. 12/06/17  Yes [name], MD  glucose blood (FREESTYLE TEST STRIPS) test strip Use to test blood sugar 3 times daily. Dx: E11.65 10/14/16  Yes Philemon Kingdom, MD  Insulin Glargine (LANTUS SOLOSTAR) 100 UNIT/ML Solostar Pen Inject 20 Units into the skin every morning. 06/09/18  Yes Philemon Kingdom, MD  insulin glargine (LANTUS) 100 UNIT/ML injection Inject 20 Units into the skin every morning.   Yes [provider]  insulin lispro (HUMALOG KWIKPEN) 100 UNIT/ML KiwkPen Inject 4-6 units into the skin daily. Patient taking differently: Inject 4 Units into the skin daily.  10/14/16  Yes Philemon Kingdom, MD  Lancets (FREESTYLE) lancets Use to test blood sugar 3 times daily. Dx: E11.65 10/14/16  Yes Philemon Kingdom, MD  levothyroxine (SYNTHROID, LEVOTHROID) 50 MCG tablet TAKE 1 TABLET BY MOUTH DAILY BEFORE BREAKFAST 05/19/18  Yes [name], MD  magnesium oxide (MAG-OX) 400 MG tablet TAKE 1 TABLET BY MOUTH EVERY DAY 07/25/18  Yes [name], MD  montelukast (SINGULAIR) 10 MG tablet TAKE 1 TABLET (10 MG TOTAL) BY MOUTH DAILY. 08/21/18  Yes Noralee Space, MD  Multiple Vitamins-Minerals (CENTRUM ADULTS PO) Take by mouth.   Yes [provider]  omeprazole (PRILOSEC) 20 MG capsule Take by mouth daily. 07/14/18  Yes [provider]  Polyethyl Glycol-Propyl Glycol (SYSTANE FREE OP) Apply 1 drop to eye 3 (three) times daily.   Yes [provider]  predniSONE (DELTASONE) 1 MG tablet Take 2 tablets (2 mg total) by mouth daily. 07/25/18  Yes [name], MD  ranitidine (ZANTAC) 150 MG tablet Take 150 mg by mouth daily.   Yes [provider]  rosuvastatin (CRESTOR) 10 MG  tablet TAKE 1 TABLET (10 MG TOTAL) BY MOUTH EVERY EVENING. 06/05/18  Yes [name], MD  sulfamethoxazole-trimethoprim (BACTRIM DS) 800-160 MG tablet Take 1 tablet by mouth 2 (two) times daily. 08/10/18  Yes Fontaine, Belinda Block, MD  tiotropium (SPIRIVA HANDIHALER) 18 MCG inhalation capsule PLACE 1 CAPSULE INTO INHALER AND INHALE AS DIRECTED EVERY DAY 10/04/18  Yes Baird Lyons D, MD  traMADol Veatrice Bourbon) 50 MG tablet  08/09/18  Yes [provider]  vitamin B-12 (CYANOCOBALAMIN) 250 MCG tablet Take 250 mcg by mouth daily.   Yes [provider]  Fluticasone-Salmeterol (ADVAIR DISKUS) 500-50 MCG/DOSE AEPB Inhale 1 puff into the lungs 2 (two) times a day. Rinse mouth 10/04/18   Baird Lyons D, MD  ipratropium-albuterol (DUONEB) 0.5-2.5 (3) MG/3ML SOLN USE 1 VIAL IN NEBULIZER 3 TIMES DAILY 09/04/18 09/04/18  Garner Nash, DO   Past Medical History:  Diagnosis Date  . Arthritis   . Asthma   . Diabetes mellitus without complication ([place])   . Fibromyalgia   . GERD (gastroesophageal reflux disease)   . History of chemotherapy   . History of fractured pelvis   .  Hypertension   . Osteoporosis 12/2017   T score -2.8 stable from prior DEXA  . Osteoporosis   . Ovarian cancer [facility])    Past Surgical History:  Procedure Laterality Date  . ABDOMINAL HYSTERECTOMY  1996  . ANKLE FRACTURE SURGERY     plate and screws  . BLADDER SUSPENSION    . CATARACT EXTRACTION    . EYE SURGERY    . hip surgey    . knee surgey    . LUNG SURGERY    . OOPHORECTOMY     BSO   Family History  Problem Relation Age of Onset  . Lung cancer Mother   . CAD Father   . Diabetes Father   . Lung cancer Maternal Aunt   . Diabetes Paternal Aunt   . Diabetes Paternal Uncle   . Diabetes Paternal Grandmother   . Diabetes Paternal Grandfather    Social History   Socioeconomic History  . Marital status: Widowed    Spouse name: Not on file  . Number of children: 2  . Years of education: Not on  file  . Highest education level: Not on file  Occupational History  . Not on file  Social Needs  . Financial resource strain: Not hard at all  . Food insecurity    Worry: Never true    Inability: Never true  . Transportation needs    Medical: No    Non-medical: No  Tobacco Use  . Smoking status: Never Smoker  . Smokeless tobacco: Never Used  Substance and Sexual Activity  . Alcohol use: No    Alcohol/week: 0.0 standard drinks  . Drug use: No  . Sexual activity: Not Currently    Comment: declined insuracne questions  Lifestyle  . Physical activity    Days per week: 0 days    Minutes per session: 0 min  . Stress: Not at all  Relationships  . Social connections    Talks on phone: More than three times a week    Gets together: More than three times a week    Attends religious service: 1 to 4 times per year    Active member of club or organization: Yes    Attends meetings of clubs or organizations: 1 to 4 times per year    Relationship status: Widowed  . Intimate partner violence    Fear of current or ex partner: Not on file    Emotionally abused: Not on file    Physically abused: Not on file    Forced sexual activity: Not on file  Other Topics Concern  . Not on file  Social History Narrative  . Not on file   ROS-see HPI   + = positive Constitutional:    weight loss, night sweats, fevers, chills, fatigue, lassitude. HEENT:    headaches, +difficulty swallowing, tooth/dental problems, sore throat,  sneezing, itching, ear ache, nasal congestion, post nasal drip, snoring CV:    chest pain, orthopnea, PND, swelling in lower extremities, anasarca,  dizziness, palpitations Resp:   +shortness of breath with exertion or at rest.                +productive cough,  + non-productive cough, coughing up of blood.              change in color of mucus.  +wheezing.   Skin:    rash or lesions. GI:  No-   heartburn, indigestion, abdominal pain, nausea, vomiting, diarrhea,  change in bowel habits, loss of appetite GU: dysuria, change in color of urine, no urgency or frequency.   flank pain. MS:   +joint pain, stiffness, decreased range of motion, back pain. Neuro-     nothing unusual Psych:  change in mood or affect.  depression or anxiety.   memory loss.  OBJ- Physical Exam General- Alert, Oriented, Affect-appropriate, Distress- none acute,  + overweight Skin- rash-none, lesions- none, excoriation- none Lymphadenopathy- none Head- atraumatic            Eyes- Gross vision intact, PERRLA, conjunctivae and secretions clear            Ears- Hearing, canals-normal            Nose- Clear, no-Septal dev, mucus, polyps, erosion, perforation             Throat- Mallampati II , mucosa clear , drainage- none, tonsils- atrophic Neck- flexible , trachea midline, no stridor , thyroid nl, carotid no bruit Chest - symmetrical excursion , unlabored           Heart/CV- RRR , no murmur , no gallop  , no rub, nl s1 s2                           - JVD- none , edema- none, stasis changes- none, varices- none           Lung- + raspy/ coarse, wheeze+, cough- none , dullness-none, rub- none           Chest wall-  Abd-  Br/ Gen/ Rectal- Not done, not indicated Extrem- cyanosis- none, clubbing, none, atrophy- none, strength- nl Neuro- grossly intact to observation

## 2018-10-04 NOTE — Telephone Encounter (Signed)
LVM giving verbals 

## 2018-10-04 NOTE — Assessment & Plan Note (Signed)
Cough could reflect some tracheobronchitis. Emphasis on airway clearance and prevention of reflux/ aspiration.

## 2018-10-04 NOTE — Telephone Encounter (Signed)
Kayla Thompson is calling and needs verbal order for PT 1 x2 , 2x2

## 2018-10-04 NOTE — Patient Instructions (Addendum)
Refills were sent for Advair and Spiriva  Ok to try using your nebulizer just once daily, increasing back to 2 or 3 times daily as needed.  Order- Flutter valve    Blow through this 4 times per set, 3 sets per day, as needed to help keep your airways clear.  Order- schedule PFT   Dx COPD mixed type  Order- CXR      Dx bronchiectasis without exacerbation  Please call if we can help

## 2018-10-05 DIAGNOSIS — M75102 Unspecified rotator cuff tear or rupture of left shoulder, not specified as traumatic: Secondary | ICD-10-CM | POA: Diagnosis not present

## 2018-10-05 DIAGNOSIS — M25561 Pain in right knee: Secondary | ICD-10-CM | POA: Diagnosis not present

## 2018-10-05 DIAGNOSIS — I5032 Chronic diastolic (congestive) heart failure: Secondary | ICD-10-CM | POA: Diagnosis not present

## 2018-10-05 DIAGNOSIS — M25562 Pain in left knee: Secondary | ICD-10-CM | POA: Diagnosis not present

## 2018-10-05 DIAGNOSIS — I110 Hypertensive heart disease with heart failure: Secondary | ICD-10-CM | POA: Diagnosis not present

## 2018-10-05 DIAGNOSIS — M1990 Unspecified osteoarthritis, unspecified site: Secondary | ICD-10-CM | POA: Diagnosis not present

## 2018-10-09 ENCOUNTER — Telehealth: Payer: Self-pay | Admitting: Internal Medicine

## 2018-10-09 ENCOUNTER — Telehealth: Payer: Self-pay | Admitting: *Deleted

## 2018-10-09 NOTE — Telephone Encounter (Signed)
ATC pt, line went to voicemail. LMTCB X1.  

## 2018-10-09 NOTE — Telephone Encounter (Signed)
Called & spoke w/ pt. Pt had questions about how to use her flutter valve. She states she recently had cataract surgery and could not read the directions provided with the device.   I gave her step-by-step instructions cited from a scholarly article and reinforced CY's directions to use device 4 times for 3 sets.  Pt verbalized understanding with no additional questions. Nothing further needed at this time.

## 2018-10-09 NOTE — Telephone Encounter (Signed)
Patient is returning the call. CB is [phone]

## 2018-10-09 NOTE — Telephone Encounter (Signed)
Pt called stating that she was suppose to have a virtual visit with Dr. Tamala Julian on 7/14 but never received a link for the visit. Pt is asking if there is an injection that you think could help her with her pain??

## 2018-10-10 ENCOUNTER — Other Ambulatory Visit: Payer: Self-pay

## 2018-10-10 DIAGNOSIS — M1990 Unspecified osteoarthritis, unspecified site: Secondary | ICD-10-CM | POA: Diagnosis not present

## 2018-10-10 DIAGNOSIS — M25561 Pain in right knee: Secondary | ICD-10-CM | POA: Diagnosis not present

## 2018-10-10 DIAGNOSIS — M75102 Unspecified rotator cuff tear or rupture of left shoulder, not specified as traumatic: Secondary | ICD-10-CM | POA: Diagnosis not present

## 2018-10-10 DIAGNOSIS — I5032 Chronic diastolic (congestive) heart failure: Secondary | ICD-10-CM | POA: Diagnosis not present

## 2018-10-10 DIAGNOSIS — M25562 Pain in left knee: Secondary | ICD-10-CM | POA: Diagnosis not present

## 2018-10-10 DIAGNOSIS — I110 Hypertensive heart disease with heart failure: Secondary | ICD-10-CM | POA: Diagnosis not present

## 2018-10-12 ENCOUNTER — Encounter: Payer: Self-pay | Admitting: Internal Medicine

## 2018-10-12 ENCOUNTER — Ambulatory Visit (INDEPENDENT_AMBULATORY_CARE_PROVIDER_SITE_OTHER): Payer: Medicare Other | Admitting: Internal Medicine

## 2018-10-12 ENCOUNTER — Other Ambulatory Visit: Payer: Self-pay

## 2018-10-12 VITALS — BP 128/86 | HR 85 | Ht 60.0 in | Wt 147.0 lb

## 2018-10-12 DIAGNOSIS — M818 Other osteoporosis without current pathological fracture: Secondary | ICD-10-CM

## 2018-10-12 DIAGNOSIS — E559 Vitamin D deficiency, unspecified: Secondary | ICD-10-CM

## 2018-10-12 DIAGNOSIS — Z794 Long term (current) use of insulin: Secondary | ICD-10-CM

## 2018-10-12 DIAGNOSIS — E213 Hyperparathyroidism, unspecified: Secondary | ICD-10-CM | POA: Diagnosis not present

## 2018-10-12 DIAGNOSIS — E1165 Type 2 diabetes mellitus with hyperglycemia: Secondary | ICD-10-CM

## 2018-10-12 DIAGNOSIS — E039 Hypothyroidism, unspecified: Secondary | ICD-10-CM | POA: Diagnosis not present

## 2018-10-12 LAB — POCT GLYCOSYLATED HEMOGLOBIN (HGB A1C): Hemoglobin A1C: 5.9 % — AB (ref 4.0–5.6)

## 2018-10-12 NOTE — Progress Notes (Signed)
Patient ID: Kayla Thompson, female   DOB: Apr 18, 1935, 83 y.o.   MRN: [id number]   HPI  Kayla Thompson is a 83 y.o.-year-old female, initially referred by her OB/GYN doctor, Dr. Uvaldo Rising, for evaluation for hyperparathyroidism + normo/hypo-calcemia, vitamin D deficiency,  and DM2, dx 2010, insulin-dependent since 2010, controlled, w/o long-term complications.  Last visit 6 months ago.  She is not usually compliant with appointments.  She continues to fall (last 08/2018), but no fractures since last visit.  Reviewed and addended history: Pt was dx with hyperparathyroidism in 08/2014.   Reviewed pertinent labs: Lab Results  Component Value Date   PTH 37 06/09/2015   PTH Comment 06/09/2015   PTH 73 (H) 10/28/2014   PTH Comment 10/28/2014   PTH 190 (H) 09/10/2014   PTH 243 (H) 08/23/2014   CALCIUM 9.9 02/03/2018   CALCIUM 9.8 10/17/2017   CALCIUM 9.0 10/10/2017   CALCIUM 8.7 (L) 10/09/2017   CALCIUM 9.5 05/18/2017   CALCIUM 9.3 12/01/2016   CALCIUM 9.3 07/15/2016   CALCIUM 9.2 10/21/2015   CALCIUM 9.2 06/09/2015   CALCIUM 9.4 03/03/2015   Of note, patient has osteoporosis, diagnosed in 2007.  Reviewed patient's DXA scan reports: Date L1-L2 (L3 and L4 excluded) T score FN T score  01/05/2018  +0.4  LFN: -2.8  07/25/2014   -0.1  LFN: -2.7  Right hip could not be analyzed due to previous instrumentation.  She has a history of multiple fractures: - 09/23/1990: pelvic fx - 02/2005: R hip fx - rod - 05/2006: rib fx - 12/2008: 3 R ankle fx - in [place] - plate and screws - 54/6270: wrist fx's 01/2012: steroid inj's R knee - 07/2014: forearm fx and nasal bone fx  She started Prolia in 2017.  She is tolerating this well.  No h/o kidney stones.  + Mild CKD. Last BUN/Cr: Lab Results  Component Value Date   BUN 22 02/03/2018   CREATININE 1.19 02/03/2018   Pt is not on HCTZ.  Patient continues on Caltrate 600 mg now 1x a day and vitamin D 4000 units daily.  Pt does not  have a FH of hypercalcemia, pituitary tumors, thyroid cancer, or osteoporosis.   Latest vitamin D level was normal: Lab Results  Component Value Date   VD25OH 81.19 06/09/2015   VD25OH 65.60 01/28/2015   VD25OH 48 08/23/2014   A 24h urine collection to check urinary calcium >> normal: Component     Latest Ref Rng 03/31/2015  Calcium, Ur     Not estab mg/dL 9  Calcium, 24 hour urine     35 - 250 mg/24 h 108  Creatinine, Urine     20 - 320 mg/dL 78  Creatinine, 24H Ur     0.63 - 2.50 g/24 h 0.94    DM2: - dx 2010 >> started insulin at diagnosis: Reviewed HbA1c levels: Lab Results  Component Value Date   HGBA1C 6.7 (A) 04/13/2018   HGBA1C 6.7 10/14/2016   HGBA1C 6.9 (H) 07/15/2016   She is on: - Lantus 30 >> 25 >> 20 units in am - Humalog 4-6 >> Novolog 4 units before breakfast only  She checks sugars once a day: - am: 46, 80-121, 138 >> 102-112 >> 99-119 >> 109-119 - after lunch: 120 >> 116-154 >> n/c  - after dinner: 132 >> 70 >> n/c - bedtime: 142, 228 >> 97, 116 >> 60 >> n/c Lowest: 92 >> 37 (?) on BMP 04/2017 >> 81 Highest: 189 >>  147 with a steroid inj's >> 170 with steroid inj (shoulder and knee)  -+ HL; latest lipids:  Lab Results  Component Value Date   CHOL 219 (H) 07/15/2016   HDL 68.60 07/15/2016   LDLCALC 113 (H) 07/15/2016   TRIG 187.0 (H) 07/15/2016   CHOLHDL 3 07/15/2016  On Crestor 10.  + Mild CKD. Latest BUN/Cr: Lab Results  Component Value Date   BUN 22 02/03/2018   Lab Results  Component Value Date   CREATININE 1.19 02/03/2018   Last dilated eye exam: 09/2018: No DR. Dr Gershon Crane.  She has a history of chalazion, has a history of cataract surgery.  She also has a history of Ovarian cancer - 1996, urinary incontinence, HTN.  She has a history of hypothyroidism and last TSH was suppressed: Lab Results  Component Value Date   TSH 0.23 (L) 12/01/2016   She is on levothyroxine 50 mcg daily: -In a.m. -Fasting - ~10 min from b'fast! >>  did not move it >30 minutes from breakfast as advised at last OV, but most of the time breakfast is later in the day - + Ca along with LT4! >> did not move calcium later in the day as advised at last OV - + MVI along with LT4! - + PPIs along with LT4! - no Biotin  ROS: Constitutional: no weight gain/no weight loss, no fatigue, no subjective hyperthermia, no subjective hypothermia Eyes: no blurry vision, no xerophthalmia ENT: no sore throat, no nodules palpated in neck, + chronic dysphagia, no odynophagia, no hoarseness Cardiovascular: no CP/no SOB/no palpitations/no leg swelling Respiratory: no cough/no SOB/no wheezing Gastrointestinal: no N/no V/no D/no C/+ acid reflux Musculoskeletal: + muscle aches/+ joint aches -has lidocaine patches on the left shoulder and cervical spine Skin: no rashes, no hair loss Neurological: + tremors/no numbness/no tingling/no dizziness  I reviewed pt's medications, allergies, PMH, social hx, family hx, and changes were documented in the history of present illness. Otherwise, unchanged from my initial visit note.  Past Medical History:  Diagnosis Date  . Arthritis   . Asthma   . Diabetes mellitus without complication ([place])   . Fibromyalgia   . GERD (gastroesophageal reflux disease)   . History of chemotherapy   . History of fractured pelvis   . Hypertension   . Osteoporosis 12/2017   T score -2.8 stable from prior DEXA  . Osteoporosis   . Ovarian cancer [facility])    Past Surgical History:  Procedure Laterality Date  . ABDOMINAL HYSTERECTOMY  1996  . ANKLE FRACTURE SURGERY     plate and screws  . BLADDER SUSPENSION    . CATARACT EXTRACTION    . EYE SURGERY    . hip surgey    . knee surgey    . LUNG SURGERY    . OOPHORECTOMY     BSO   History   Social History  . Marital Status: Widowed    Spouse Name: N/A  . Number of Children: 2 (1 adopted)   Occupational History  . retired   Social History Main Topics  . Smoking status: Never Smoker    . Smokeless tobacco: Never Used  . Alcohol Use: No  . Drug Use: No   Current Outpatient Medications on File Prior to Visit  Medication Sig Dispense Refill  . albuterol (PROVENTIL HFA;VENTOLIN HFA) 108 (90 Base) MCG/ACT inhaler Inhale 2 puffs into the lungs every 6 (six) hours as needed for wheezing or shortness of breath. 1 Inhaler 11  . amitriptyline (ELAVIL)  10 MG tablet Take 1 tablet (10 mg total) by mouth at bedtime. 90 tablet 1  . baclofen (LIORESAL) 10 MG tablet TAKE 0.5-1 TABLET BY MOUTH TWICE DAILY AS NEEDED FOR MUSCLE SPASMS OR PAIN 180 tablet 1  . BD PEN NEEDLE NANO U/F 32G X 4 MM MISC USE 4 TIMES A DAY 400 each 1  . busPIRone (BUSPAR) 5 MG tablet TAKE 1 TO 2 TABLETS BY MOUTH TWICE A DAY 360 tablet 1  . calcium carbonate (OSCAL) 1500 (600 Ca) MG TABS tablet Take 1,500 mg by mouth daily.    . carvedilol (COREG) 6.25 MG tablet TAKE 1/2 TABLET BY MOUTH 2 (TWO) TIMES DAILY WITH A MEAL. 90 tablet 3  . Cholecalciferol (VITAMIN D3) 2000 units TABS Take 2 tablets by mouth daily.     Marland Kitchen denosumab (PROLIA) 60 MG/ML SOLN injection Inject 60 mg into the skin every 6 (six) months. Administer in upper arm, thigh, or abdomen    . Dextromethorphan-Guaifenesin (MUCINEX DM MAXIMUM STRENGTH) 60-1200 MG TB12 Take 1 tablet by mouth 2 (two) times daily. Reported on 06/23/2015    . donepezil (ARICEPT) 5 MG tablet TAKE 1 TABLET BY MOUTH EVERYDAY AT BEDTIME    . DULoxetine (CYMBALTA) 30 MG capsule Take 1 capsule (30 mg total) by mouth daily. (Patient taking differently: Take 30 mg by mouth daily. 90 mg daily) 90 capsule 3  . fluticasone (FLONASE) 50 MCG/ACT nasal spray Place 2 sprays into both nostrils daily. 48 g 3  . Fluticasone-Salmeterol (ADVAIR DISKUS) 500-50 MCG/DOSE AEPB Inhale 1 puff into the lungs 2 (two) times a day. Rinse mouth 60 each 12  . glucose blood (FREESTYLE TEST STRIPS) test strip Use to test blood sugar 3 times daily. Dx: E11.65 100 each 3  . Insulin Glargine (LANTUS SOLOSTAR) 100 UNIT/ML  Solostar Pen Inject 20 Units into the skin every morning. 27 pen 1  . insulin glargine (LANTUS) 100 UNIT/ML injection Inject 20 Units into the skin every morning.    . insulin lispro (HUMALOG KWIKPEN) 100 UNIT/ML KiwkPen Inject 4-6 units into the skin daily. (Patient taking differently: Inject 4 Units into the skin daily. ) 5 pen 5  . Lancets (FREESTYLE) lancets Use to test blood sugar 3 times daily. Dx: E11.65 100 each 3  . levothyroxine (SYNTHROID, LEVOTHROID) 50 MCG tablet TAKE 1 TABLET BY MOUTH DAILY BEFORE BREAKFAST 90 tablet 1  . magnesium oxide (MAG-OX) 400 MG tablet TAKE 1 TABLET BY MOUTH EVERY DAY 90 tablet 1  . montelukast (SINGULAIR) 10 MG tablet TAKE 1 TABLET (10 MG TOTAL) BY MOUTH DAILY. 90 tablet 0  . Multiple Vitamins-Minerals (CENTRUM ADULTS PO) Take by mouth.    Marland Kitchen omeprazole (PRILOSEC) 20 MG capsule Take by mouth daily.    Vladimir Faster Glycol-Propyl Glycol (SYSTANE FREE OP) Apply 1 drop to eye 3 (three) times daily.    . predniSONE (DELTASONE) 1 MG tablet Take 2 tablets (2 mg total) by mouth daily. 60 tablet 1  . ranitidine (ZANTAC) 150 MG tablet Take 150 mg by mouth daily.    . rosuvastatin (CRESTOR) 10 MG tablet TAKE 1 TABLET (10 MG TOTAL) BY MOUTH EVERY EVENING. 90 tablet 2  . sulfamethoxazole-trimethoprim (BACTRIM DS) 800-160 MG tablet Take 1 tablet by mouth 2 (two) times daily. 10 tablet 0  . tiotropium (SPIRIVA HANDIHALER) 18 MCG inhalation capsule PLACE 1 CAPSULE INTO INHALER AND INHALE AS DIRECTED EVERY DAY 30 capsule 12  . traMADol (ULTRAM) 50 MG tablet     . vitamin B-12 (  CYANOCOBALAMIN) 250 MCG tablet Take 250 mcg by mouth daily.    . [DISCONTINUED] ipratropium-albuterol (DUONEB) 0.5-2.5 (3) MG/3ML SOLN USE 1 VIAL IN NEBULIZER 3 TIMES DAILY 90 mL 2   No current facility-administered medications on file prior to visit.    Allergies  Allergen Reactions  . Latex Rash  . Penicillins Rash    Has patient had a PCN reaction causing immediate rash, facial/tongue/throat  swelling, SOB or lightheadedness with hypotension: No Has patient had a PCN reaction causing severe rash involving mucus membranes or skin necrosis: No Has patient had a PCN reaction that required hospitalization: No Has patient had a PCN reaction occurring within the last 10 years: No If all of the above answers are "NO", then may proceed with Cephalosporin use.    Family History  Problem Relation Age of Onset  . Lung cancer Mother   . CAD Father   . Diabetes Father   . Lung cancer Maternal Aunt   . Diabetes Paternal Aunt   . Diabetes Paternal Uncle   . Diabetes Paternal Grandmother   . Diabetes Paternal Grandfather    PE: There were no vitals taken for this visit. There is no height or weight on file to calculate BMI. Wt Readings from Last 3 Encounters:  10/04/18 148 lb 9.6 oz (67.4 kg)  08/12/18 150 lb (68 kg)  04/13/18 150 lb (68 kg)   Constitutional: normal weight, in NAD, walks with a walker Eyes: PERRLA, EOMI, no exophthalmos ENT: moist mucous membranes, no thyromegaly, no cervical lymphadenopathy Cardiovascular: RRR, No MRG Respiratory: CTA B Gastrointestinal: abdomen soft, NT, ND, BS+ Musculoskeletal: no deformities, strength intact in all 4 Skin: moist, warm, no rashes Neurological: + tremor with outstretched hands, DTR normal in all 4  Assessment: 1. H/o Hyperparathyroidism - recent normocalcemia  2. Vitamin D def  3. DM2, insulin-dependent, controlled, without complications  4. Low TSH  5.  Osteoporosis  Plan: 1. HPTH and 2. Vit D def -Patient with history of hyper PTH in the setting of low calcium, which normalized afterwards.  She continues on Prolia injections, calcium 600 mg once a day and vitamin D 4000 units daily.   -Calcium and PTH were normal at last check -We will continue to follow calcium but we will not recheck a PTH level unless calcium starts rising -We will check a BMP and vitamin D today  3. DM2 - Patient with longstanding,  previously uncontrolled type 2 diabetes, on basal insulin and rapid acting insulin only before breakfast -At last visit, HbA1c was 6.7% and we did not change her regimen then. -At this visit, she is only checking sugars in the morning and they are at goal.  However, her HbA1c is: 5.9% (improved) >> I strongly advised her to check sugars later in the day, also, rotating check times, now, 112 hypoglycemia as much as possible.  We will stop NovoLog and reduce Lantus dose. - I advised her to  Patient Instructions  Please decrease: - Lantus 15 units in am.  Stop: - NovoLog  Continue levothyroxine 50 mcg daily.  Take the thyroid hormone every day, with water, at least 30 minutes before breakfast, separated by at least 4 hours from: - acid reflux medications - calcium - iron - multivitamins  Please move multivitamins, Omeprazole, and calcium at night.  Move breakfast >30 min from Levothyroxine.  Continue vitamin D 4000 units daily.  Check sugars 1-2x a day, rotating check times.  Please come back for a follow-up appointment in 6  months.  - advised to check sugars at different times of the day - 1x a day, rotating check times - advised for yearly eye exams >> she is UTD - We will check a lipid panel today - return to clinic in 6 months  4.  Low TSH -She does have a history of hypothyroidism and is on levothyroxine 50 mcg daily.  Before last visit, she had a low TSH but at that time she was not taking levothyroxine correctly.  We discussed about how to take this and advised her to come back for recheck (she did not).  I did suggest to decrease the dose of levothyroxine to 25 mcg daily, but she refused as she is afraid that she will start losing hair if this happens.  We again discussed about this today and I advised her about the cardiovascular risks of thyrotoxicosis -At this visit, I checked with her how she is taking the levothyroxine and she is taking it incorrectly -did not both  calcium later in the day.  Moreover, she started me that she also takes a multivitamin and omeprazole along with levothyroxine.  I strongly advised her to move this later, but I am concerned that we may need to reduce the dose or even stop levothyroxine after this. -Recheck TFTs today  5.  Osteoporosis -Last DXA scan results reviewed from 12/2017: Left femoral neck -2.8 T score compared to 07/25/2014: -2.6 T score.  Right femoral neck could not be analyzed because of hardware.  The scores were stable. -Continue Prolia.  -She is due for DXA next year  Component     Latest Ref Rng & Units 10/12/2018          Glucose     65 - 99 mg/dL 124 (H)  BUN     7 - 25 mg/dL 19  Creatinine     0.60 - 0.88 mg/dL 1.11 (H)  GFR, Est Non African American     > OR = 60 mL/min/1.68m2 47 (L)  GFR, Est African American     > OR = 60 mL/min/1.63m2 54 (L)  BUN/Creatinine Ratio     6 - 22 (calc) 17  Sodium     135 - 146 mmol/L 137  Potassium     3.5 - 5.3 mmol/L 5.1  Chloride     98 - 110 mmol/L 101  CO2     20 - 32 mmol/L 26  Calcium     8.6 - 10.4 mg/dL 9.7  Total Protein     6.1 - 8.1 g/dL 6.1  Albumin MSPROF     3.6 - 5.1 g/dL 3.8  Globulin     1.9 - 3.7 g/dL (calc) 2.3  AG Ratio     1.0 - 2.5 (calc) 1.7  Total Bilirubin     0.2 - 1.2 mg/dL 0.4  Alkaline phosphatase (APISO)     37 - 153 U/L 41  AST     10 - 35 U/L 21  ALT     6 - 29 U/L 22  Cholesterol     0 - 200 mg/dL 149  Triglycerides     0.0 - 149.0 mg/dL 130.0  HDL Cholesterol     >39.00 mg/dL 63.00  VLDL     0.0 - 40.0 mg/dL 26.0  LDL (calc)     0 - 99 mg/dL 60  Total CHOL/HDL Ratio      2  NonHDL      85.86  TSH  0.35 - 4.50 uIU/mL 0.85  Hemoglobin A1C     4.0 - 5.6 % 5.9 (A)  VITD     30.00 - 100.00 ng/mL 72.23  T4,Free(Direct)     0.60 - 1.60 ng/dL 0.77   Thyroid tests, vitamin D, lipid panel normal. Kidney function is stable.   Glucose slightly high. Calcium level is normal  Philemon Kingdom, MD  PhD [facility] Endocrinology

## 2018-10-12 NOTE — Patient Instructions (Addendum)
Please decrease: - Lantus 15 units in am.  Stop: - NovoLog  Continue levothyroxine 50 mcg daily.  Take the thyroid hormone every day, with water, at least 30 minutes before breakfast, separated by at least 4 hours from: - acid reflux medications - calcium - iron - multivitamins  Please move multivitamins, Omeprazole, and calcium at night.  Move breakfast >30 min from Levothyroxine.  Continue vitamin D 4000 units daily.  Check sugars 1-2x a day, rotating check times.  Please come back for a follow-up appointment in 6 months.

## 2018-10-13 ENCOUNTER — Telehealth: Payer: Self-pay

## 2018-10-13 DIAGNOSIS — M1990 Unspecified osteoarthritis, unspecified site: Secondary | ICD-10-CM | POA: Diagnosis not present

## 2018-10-13 DIAGNOSIS — M25561 Pain in right knee: Secondary | ICD-10-CM | POA: Diagnosis not present

## 2018-10-13 DIAGNOSIS — I110 Hypertensive heart disease with heart failure: Secondary | ICD-10-CM | POA: Diagnosis not present

## 2018-10-13 DIAGNOSIS — I5032 Chronic diastolic (congestive) heart failure: Secondary | ICD-10-CM | POA: Diagnosis not present

## 2018-10-13 DIAGNOSIS — M25562 Pain in left knee: Secondary | ICD-10-CM | POA: Diagnosis not present

## 2018-10-13 DIAGNOSIS — M75102 Unspecified rotator cuff tear or rupture of left shoulder, not specified as traumatic: Secondary | ICD-10-CM | POA: Diagnosis not present

## 2018-10-13 LAB — T4, FREE: Free T4: 0.77 ng/dL (ref 0.60–1.60)

## 2018-10-13 LAB — LIPID PANEL
Cholesterol: 149 mg/dL (ref 0–200)
HDL: 63 mg/dL (ref >39.00)
LDL Cholesterol: 60 mg/dL (ref 0–99)
NonHDL: 85.86
Total CHOL/HDL Ratio: 2
Triglycerides: 130 mg/dL (ref 0.0–149.0)
VLDL: 26 mg/dL (ref 0.0–40.0)

## 2018-10-13 LAB — COMPLETE METABOLIC PANEL WITH GFR
AG Ratio: 1.7 (calc) (ref 1.0–2.5)
ALT: 22 U/L (ref 6–29)
AST: 21 U/L (ref 10–35)
Albumin: 3.8 g/dL (ref 3.6–5.1)
Alkaline phosphatase (APISO): 41 U/L (ref 37–153)
BUN/Creatinine Ratio: 17 (calc) (ref 6–22)
BUN: 19 mg/dL (ref 7–25)
CO2: 26 mmol/L (ref 20–32)
Calcium: 9.7 mg/dL (ref 8.6–10.4)
Chloride: 101 mmol/L (ref 98–110)
Creat: 1.11 mg/dL — ABNORMAL HIGH (ref 0.60–0.88)
GFR, Est African American: 54 mL/min/{1.73_m2} — ABNORMAL LOW (ref >=60)
GFR, Est Non African American: 47 mL/min/{1.73_m2} — ABNORMAL LOW (ref >=60)
Globulin: 2.3 g/dL (calc) (ref 1.9–3.7)
Glucose, Bld: 124 mg/dL — ABNORMAL HIGH (ref 65–99)
Potassium: 5.1 mmol/L (ref 3.5–5.3)
Sodium: 137 mmol/L (ref 135–146)
Total Bilirubin: 0.4 mg/dL (ref 0.2–1.2)
Total Protein: 6.1 g/dL (ref 6.1–8.1)

## 2018-10-13 LAB — TSH: TSH: 0.85 u[IU]/mL (ref 0.35–4.50)

## 2018-10-13 LAB — VITAMIN D 25 HYDROXY (VIT D DEFICIENCY, FRACTURES): VITD: 72.23 ng/mL (ref 30.00–100.00)

## 2018-10-13 NOTE — Telephone Encounter (Signed)
Notified patient of message from Dr. Gherghe, patient expressed understanding and agreement. No further questions.  

## 2018-10-13 NOTE — Telephone Encounter (Signed)
-----   Message from Philemon Kingdom, MD sent at 10/13/2018  4:54 PM EDT ----- Lenna Sciara, can you please call pt:  Thyroid tests, vitamin D, lipid panel normal. Kidney function is stable.   Glucose slightly high. Calcium level is normal

## 2018-10-16 DIAGNOSIS — M75102 Unspecified rotator cuff tear or rupture of left shoulder, not specified as traumatic: Secondary | ICD-10-CM | POA: Diagnosis not present

## 2018-10-16 DIAGNOSIS — M25561 Pain in right knee: Secondary | ICD-10-CM | POA: Diagnosis not present

## 2018-10-16 DIAGNOSIS — M25562 Pain in left knee: Secondary | ICD-10-CM | POA: Diagnosis not present

## 2018-10-16 DIAGNOSIS — M1990 Unspecified osteoarthritis, unspecified site: Secondary | ICD-10-CM | POA: Diagnosis not present

## 2018-10-16 DIAGNOSIS — I5032 Chronic diastolic (congestive) heart failure: Secondary | ICD-10-CM | POA: Diagnosis not present

## 2018-10-16 DIAGNOSIS — I110 Hypertensive heart disease with heart failure: Secondary | ICD-10-CM | POA: Diagnosis not present

## 2018-10-19 DIAGNOSIS — M25561 Pain in right knee: Secondary | ICD-10-CM | POA: Diagnosis not present

## 2018-10-19 DIAGNOSIS — I110 Hypertensive heart disease with heart failure: Secondary | ICD-10-CM | POA: Diagnosis not present

## 2018-10-19 DIAGNOSIS — I5032 Chronic diastolic (congestive) heart failure: Secondary | ICD-10-CM | POA: Diagnosis not present

## 2018-10-19 DIAGNOSIS — M75102 Unspecified rotator cuff tear or rupture of left shoulder, not specified as traumatic: Secondary | ICD-10-CM | POA: Diagnosis not present

## 2018-10-19 DIAGNOSIS — M1990 Unspecified osteoarthritis, unspecified site: Secondary | ICD-10-CM | POA: Diagnosis not present

## 2018-10-19 DIAGNOSIS — M25562 Pain in left knee: Secondary | ICD-10-CM | POA: Diagnosis not present

## 2018-10-23 DIAGNOSIS — M25562 Pain in left knee: Secondary | ICD-10-CM | POA: Diagnosis not present

## 2018-10-23 DIAGNOSIS — M1990 Unspecified osteoarthritis, unspecified site: Secondary | ICD-10-CM | POA: Diagnosis not present

## 2018-10-23 DIAGNOSIS — I5032 Chronic diastolic (congestive) heart failure: Secondary | ICD-10-CM | POA: Diagnosis not present

## 2018-10-23 DIAGNOSIS — I110 Hypertensive heart disease with heart failure: Secondary | ICD-10-CM | POA: Diagnosis not present

## 2018-10-23 DIAGNOSIS — M75102 Unspecified rotator cuff tear or rupture of left shoulder, not specified as traumatic: Secondary | ICD-10-CM | POA: Diagnosis not present

## 2018-10-23 DIAGNOSIS — M25561 Pain in right knee: Secondary | ICD-10-CM | POA: Diagnosis not present

## 2018-10-24 DIAGNOSIS — H9209 Otalgia, unspecified ear: Secondary | ICD-10-CM | POA: Diagnosis not present

## 2018-10-24 DIAGNOSIS — H6123 Impacted cerumen, bilateral: Secondary | ICD-10-CM | POA: Diagnosis not present

## 2018-10-24 DIAGNOSIS — J343 Hypertrophy of nasal turbinates: Secondary | ICD-10-CM | POA: Diagnosis not present

## 2018-10-24 DIAGNOSIS — R1312 Dysphagia, oropharyngeal phase: Secondary | ICD-10-CM | POA: Diagnosis not present

## 2018-10-24 DIAGNOSIS — J310 Chronic rhinitis: Secondary | ICD-10-CM | POA: Diagnosis not present

## 2018-10-25 ENCOUNTER — Ambulatory Visit (INDEPENDENT_AMBULATORY_CARE_PROVIDER_SITE_OTHER): Payer: Medicare Other | Admitting: Family Medicine

## 2018-10-25 ENCOUNTER — Other Ambulatory Visit (HOSPITAL_COMMUNITY): Payer: Self-pay | Admitting: Otolaryngology

## 2018-10-25 ENCOUNTER — Encounter: Payer: Self-pay | Admitting: Family Medicine

## 2018-10-25 ENCOUNTER — Telehealth: Payer: Self-pay | Admitting: Internal Medicine

## 2018-10-25 ENCOUNTER — Other Ambulatory Visit: Payer: Self-pay | Admitting: Otolaryngology

## 2018-10-25 DIAGNOSIS — R1310 Dysphagia, unspecified: Secondary | ICD-10-CM

## 2018-10-25 DIAGNOSIS — M353 Polymyalgia rheumatica: Secondary | ICD-10-CM

## 2018-10-25 NOTE — Telephone Encounter (Signed)
Copied from [place] [id number]. Topic: Quick Communication - Home Health Verbal Orders >> Oct 25, 2018 12:47 PM Erick Blinks wrote: Caller/Agency: [facility] Number: [phone] Requesting OT/PT/Skilled Nursing/Social Work/Speech Therapy: PT is getting ready to discharge her for Thursday or Friday, still has pain related issues. Recommends outpatient therapy if PCP approves. Pt has many limitations due to pain.

## 2018-10-25 NOTE — Progress Notes (Signed)
Virtual Visit via Video Note  I connected with Kayla Thompson on 10/25/18 at  3:30 PM EDT by a video enabled telemedicine application and verified that I am speaking with the correct person using two identifiers.  Location: Patient: Patient in home setting Provider in office setting   I discussed the limitations of evaluation and management by telemedicine and the availability of in person appointments. The patient expressed understanding and agreed to proceed.  History of Present Illness: Patient is an 83 year old female who is on chronic narcotics only in injury to do a virtual visit secondary to chronic pain.  Continuing to have left shoulder and more left knee pain.  Attempted to do home health physical therapy with physical therapist felt like she would need outpatient physical therapy.  Patient is not wanting to do that and instead wants stronger pain medications.  States that the pain is unrelenting and debilitating.   Observations/Objective: Alert and oriented x3.  Patient is very standoffish and continues to ask for pain medications on multiple times   Assessment and Plan: Patient is an 83 year old female with multiple insufficiency fractures and osteoarthritic changes.  Patient is shoulder and knee pain.  I do not feel comfortable increasing dosing of any sort when it comes to her pain medications.  Patient is adamant about adding other type of medications because she says they have not helped in the past and is adamant against patient does have therapy.  Patient will come into the office and I will consider injections in the shoulder and the knee to see how patient responds.  Follow-up again at that time   Follow Up Instructions: As stated above    I discussed the assessment and treatment plan with the patient. The patient was provided an opportunity to ask questions and all were answered. The patient agreed with the plan and demonstrated an understanding of the instructions.   The  patient was advised to call back or seek an in-person evaluation if the symptoms worsen or if the condition fails to improve as anticipated.  I provided 26 minutes of face-to-face time during this encounter.   Lyndal Pulley, DO

## 2018-10-26 DIAGNOSIS — I5032 Chronic diastolic (congestive) heart failure: Secondary | ICD-10-CM | POA: Diagnosis not present

## 2018-10-26 DIAGNOSIS — M75102 Unspecified rotator cuff tear or rupture of left shoulder, not specified as traumatic: Secondary | ICD-10-CM | POA: Diagnosis not present

## 2018-10-26 DIAGNOSIS — M25561 Pain in right knee: Secondary | ICD-10-CM | POA: Diagnosis not present

## 2018-10-26 DIAGNOSIS — M25562 Pain in left knee: Secondary | ICD-10-CM | POA: Diagnosis not present

## 2018-10-26 DIAGNOSIS — M1990 Unspecified osteoarthritis, unspecified site: Secondary | ICD-10-CM | POA: Diagnosis not present

## 2018-10-26 DIAGNOSIS — I110 Hypertensive heart disease with heart failure: Secondary | ICD-10-CM | POA: Diagnosis not present

## 2018-10-26 NOTE — Telephone Encounter (Signed)
Does patient want to go to outpatient PT?

## 2018-10-26 NOTE — Telephone Encounter (Signed)
Called patient she refused outpatient PT

## 2018-10-26 NOTE — Telephone Encounter (Signed)
Noted  

## 2018-11-01 ENCOUNTER — Other Ambulatory Visit: Payer: Self-pay

## 2018-11-01 ENCOUNTER — Other Ambulatory Visit (HOSPITAL_COMMUNITY): Payer: Self-pay | Admitting: Otolaryngology

## 2018-11-01 ENCOUNTER — Ambulatory Visit (HOSPITAL_COMMUNITY)
Admission: RE | Admit: 2018-11-01 | Discharge: 2018-11-01 | Disposition: A | Discharge status: Home / Self Care | Payer: Medicare Other | Source: Ambulatory Visit | Attending: Otolaryngology | Admitting: Otolaryngology

## 2018-11-01 DIAGNOSIS — R1310 Dysphagia, unspecified: Secondary | ICD-10-CM

## 2018-11-01 DIAGNOSIS — K224 Dyskinesia of esophagus: Secondary | ICD-10-CM | POA: Diagnosis not present

## 2018-11-01 DIAGNOSIS — K449 Diaphragmatic hernia without obstruction or gangrene: Secondary | ICD-10-CM | POA: Diagnosis not present

## 2018-11-05 ENCOUNTER — Other Ambulatory Visit: Payer: Self-pay | Admitting: Internal Medicine

## 2018-11-05 DIAGNOSIS — E039 Hypothyroidism, unspecified: Secondary | ICD-10-CM

## 2018-11-13 ENCOUNTER — Other Ambulatory Visit: Payer: Self-pay | Admitting: Pulmonary Disease

## 2018-11-16 ENCOUNTER — Encounter: Payer: Self-pay | Admitting: Family Medicine

## 2018-11-16 ENCOUNTER — Ambulatory Visit (INDEPENDENT_AMBULATORY_CARE_PROVIDER_SITE_OTHER): Payer: Medicare Other | Admitting: Family Medicine

## 2018-11-16 ENCOUNTER — Other Ambulatory Visit: Payer: Self-pay

## 2018-11-16 DIAGNOSIS — M12812 Other specific arthropathies, not elsewhere classified, left shoulder: Secondary | ICD-10-CM

## 2018-11-16 DIAGNOSIS — M1712 Unilateral primary osteoarthritis, left knee: Secondary | ICD-10-CM | POA: Diagnosis not present

## 2018-11-16 NOTE — Assessment & Plan Note (Signed)
Patient given injection today, tolerated the procedure well, discussed which activities of doing which was to avoid.  Discussed posture and ergonomics, discussed with patient that this will be a chronic problem.  Patient has many other comorbidities and will make it difficult for him to change any other significant things at this time.  Patient will follow-up with me again 8 to 10 weeks for further injection.  Declined giving patient any type of narcotic secondary to fall risk

## 2018-11-16 NOTE — Progress Notes (Signed)
[facility] [address] [place], [place] [address] Phone: [phone] Subjective:   I [name] am serving as a Education administrator for Dr. Hulan Saas.  I'm seeing this patient by the request  of:    CC: Left knee and shoulder pain  [id number]  Kayla Thompson is a 83 y.o. female coming in with complaint of left knee and shoulder pain. States that she would like an injection. Last seen virtually.  Patient states that the pain is unrelenting.  Has asked for pain medications on multiple occasions.  Feels like it is the only thing that seems to be helpful.     Past Medical History:  Diagnosis Date  . Arthritis   . Asthma   . Diabetes mellitus without complication ([place])   . Fibromyalgia   . GERD (gastroesophageal reflux disease)   . History of chemotherapy   . History of fractured pelvis   . Hypertension   . Osteoporosis 12/2017   T score -2.8 stable from prior DEXA  . Osteoporosis   . Ovarian cancer [facility])    Past Surgical History:  Procedure Laterality Date  . ABDOMINAL HYSTERECTOMY  1996  . ANKLE FRACTURE SURGERY     plate and screws  . BLADDER SUSPENSION    . CATARACT EXTRACTION    . EYE SURGERY    . hip surgey    . knee surgey    . LUNG SURGERY    . OOPHORECTOMY     BSO   Social History   Socioeconomic History  . Marital status: Widowed    Spouse name: Not on file  . Number of children: 2  . Years of education: Not on file  . Highest education level: Not on file  Occupational History  . Not on file  Social Needs  . Financial resource strain: Not hard at all  . Food insecurity    Worry: Never true    Inability: Never true  . Transportation needs    Medical: No    Non-medical: No  Tobacco Use  . Smoking status: Never Smoker  . Smokeless tobacco: Never Used  Substance and Sexual Activity  . Alcohol use: No    Alcohol/week: 0.0 standard drinks  . Drug use: No  . Sexual activity: Not Currently    Comment: declined  insuracne questions  Lifestyle  . Physical activity    Days per week: 0 days    Minutes per session: 0 min  . Stress: Not at all  Relationships  . Social connections    Talks on phone: More than three times a week    Gets together: More than three times a week    Attends religious service: 1 to 4 times per year    Active member of club or organization: Yes    Attends meetings of clubs or organizations: 1 to 4 times per year    Relationship status: Widowed  Other Topics Concern  . Not on file  Social History Narrative  . Not on file   Allergies  Allergen Reactions  . Latex Rash  . Penicillins Rash    Has patient had a PCN reaction causing immediate rash, facial/tongue/throat swelling, SOB or lightheadedness with hypotension: No Has patient had a PCN reaction causing severe rash involving mucus membranes or skin necrosis: No Has patient had a PCN reaction that required hospitalization: No Has patient had a PCN reaction occurring within the last 10 years: No If all of the above answers  are "NO", then may proceed with Cephalosporin use.    Family History  Problem Relation Age of Onset  . Lung cancer Mother   . CAD Father   . Diabetes Father   . Lung cancer Maternal Aunt   . Diabetes Paternal Aunt   . Diabetes Paternal Uncle   . Diabetes Paternal Grandmother   . Diabetes Paternal Grandfather     Current Outpatient Medications (Endocrine & Metabolic):  .  denosumab (PROLIA) 60 MG/ML SOLN injection, Inject 60 mg into the skin every 6 (six) months. Administer in upper arm, thigh, or abdomen .  Insulin Glargine (LANTUS SOLOSTAR) 100 UNIT/ML Solostar Pen, Inject 20 Units into the skin every morning. .  insulin lispro (HUMALOG KWIKPEN) 100 UNIT/ML KiwkPen, Inject 4-6 units into the skin daily. (Patient taking differently: Inject 4 Units into the skin daily. ) .  levothyroxine (SYNTHROID) 50 MCG tablet, TAKE 1 TABLET BY MOUTH EVERY DAY BEFORE BREAKFAST .  predniSONE (DELTASONE) 1  MG tablet, Take 2 tablets (2 mg total) by mouth daily.  Current Outpatient Medications (Cardiovascular):  .  carvedilol (COREG) 6.25 MG tablet, TAKE 1/2 TABLET BY MOUTH 2 (TWO) TIMES DAILY WITH A MEAL. .  rosuvastatin (CRESTOR) 10 MG tablet, TAKE 1 TABLET (10 MG TOTAL) BY MOUTH EVERY EVENING.  Current Outpatient Medications (Respiratory):  .  albuterol (PROVENTIL HFA;VENTOLIN HFA) 108 (90 Base) MCG/ACT inhaler, Inhale 2 puffs into the lungs every 6 (six) hours as needed for wheezing or shortness of breath. .  Dextromethorphan-Guaifenesin (MUCINEX DM MAXIMUM STRENGTH) 60-1200 MG TB12, Take 1 tablet by mouth 2 (two) times daily. Reported on 06/23/2015 .  fluticasone (FLONASE) 50 MCG/ACT nasal spray, Place 2 sprays into both nostrils daily. .  Fluticasone-Salmeterol (ADVAIR DISKUS) 500-50 MCG/DOSE AEPB, Inhale 1 puff into the lungs 2 (two) times a day. Rinse mouth .  montelukast (SINGULAIR) 10 MG tablet, TAKE 1 TABLET BY MOUTH EVERY DAY .  tiotropium (SPIRIVA HANDIHALER) 18 MCG inhalation capsule, PLACE 1 CAPSULE INTO INHALER AND INHALE AS DIRECTED EVERY DAY  Current Outpatient Medications (Analgesics):  .  traMADol (ULTRAM) 50 MG tablet,   Current Outpatient Medications (Hematological):  .  vitamin B-12 (CYANOCOBALAMIN) 250 MCG tablet, Take 250 mcg by mouth daily.  Current Outpatient Medications (Other):  .  amitriptyline (ELAVIL) 10 MG tablet, Take 1 tablet (10 mg total) by mouth at bedtime. .  baclofen (LIORESAL) 10 MG tablet, TAKE 0.5-1 TABLET BY MOUTH TWICE DAILY AS NEEDED FOR MUSCLE SPASMS OR PAIN .  BD PEN NEEDLE NANO U/F 32G X 4 MM MISC, USE 4 TIMES A DAY .  busPIRone (BUSPAR) 5 MG tablet, TAKE 1 TO 2 TABLETS BY MOUTH TWICE A DAY .  calcium carbonate (OSCAL) 1500 (600 Ca) MG TABS tablet, Take 1,500 mg by mouth daily. .  Cholecalciferol (VITAMIN D3) 2000 units TABS, Take 2 tablets by mouth daily.  Marland Kitchen  donepezil (ARICEPT) 5 MG tablet, TAKE 1 TABLET BY MOUTH EVERYDAY AT BEDTIME .   DULoxetine (CYMBALTA) 30 MG capsule, Take 1 capsule (30 mg total) by mouth daily. (Patient taking differently: Take 30 mg by mouth daily. 90 mg daily) .  glucose blood (FREESTYLE TEST STRIPS) test strip, Use to test blood sugar 3 times daily. Dx: E11.65 .  Lancets (FREESTYLE) lancets, Use to test blood sugar 3 times daily. Dx: E11.65 .  magnesium oxide (MAG-OX) 400 MG tablet, TAKE 1 TABLET BY MOUTH EVERY DAY .  Multiple Vitamins-Minerals (CENTRUM ADULTS PO), Take by mouth. Marland Kitchen  omeprazole (PRILOSEC) 20  MG capsule, Take by mouth daily. Vladimir Faster Glycol-Propyl Glycol (SYSTANE FREE OP), Apply 1 drop to eye 3 (three) times daily. .  ranitidine (ZANTAC) 150 MG tablet, Take 150 mg by mouth daily. Marland Kitchen  sulfamethoxazole-trimethoprim (BACTRIM DS) 800-160 MG tablet, Take 1 tablet by mouth 2 (two) times daily. Marland Kitchen  UNABLE TO FIND, Take 1,000 mg by mouth daily. Med Name: Tumeric .  UNABLE TO FIND, Take 1,000 mg by mouth daily. Med Name: Zannie Cove    Past medical history, social, surgical and family history all reviewed in electronic medical record.  No pertanent information unless stated regarding to the chief complaint.   Review of Systems:  No headache, visual changes, nausea, vomiting, diarrhea, constipation, dizziness, abdominal pain, skin rash, fevers, chills, night sweats, weight loss, swollen lymph nodes, body aches, joint swelling,  chest pain, shortness of breath, mood changes.  Positive muscle aches  Objective  Blood pressure 110/70, pulse 78, height 5' (1.524 m), SpO2 97 %.     General: No apparent distress alert and oriented x3 mood and affect normal, dressed appropriately.  HEENT: Pupils equal, extraocular movements intact  Respiratory: Patient's speak in full sentences and does not appear short of breath  Cardiovascular: Trace lower extremity edema, non tender, no erythema  Skin: Warm dry intact with no signs of infection or rash on extremities or on axial skeleton.  Abdomen: Soft  nontender  Neuro: Cranial nerves II through XII are intact, neurovascularly intact in all extremities with 2+ DTRs and 2+ pulses.  Lymph: No lymphadenopathy of posterior or anterior cervical chain or axillae bilaterally.  Gait walking with the aid of a walker MSK: Severe arthritic changes of multiple joints  Left shoulder exam does show significant atrophy noted.  Crepitus in all range of motion and decrease in all range of motion.  Patient has 3 out of 5 strength rotator cuff.  Only 4 out of 5 strength on the contralateral side  Knee: Left valgus deformity noted.  Abnormal thigh to calf ratio.  Tender to palpation over medial and PF joint line.  ROM full in flexion and extension and lower leg rotation. instability with valgus force.  painful patellar compression. Patellar glide with moderate crepitus. Patellar and quadriceps tendons unremarkable. Hamstring and quadriceps strength is normal.  After informed written and verbal consent, patient was seated on exam table. Left knee was prepped with alcohol swab and utilizing anterolateral approach, patient's left knee space was injected with 4:1  marcaine 0.5%: Kenalog 40mg /dL. Patient tolerated the procedure well without immediate complications.  Procedure: Real-time Ultrasound Guided Injection of left glenohumeral joint Device: GE Logiq E  Ultrasound guided injection is preferred based studies that show increased duration, increased effect, greater accuracy, decreased procedural pain, increased response rate with ultrasound guided versus blind injection.  Verbal informed consent obtained.  Time-out conducted.  Noted no overlying erythema, induration, or other signs of local infection.  Skin prepped in a sterile fashion.  Local anesthesia: Topical Ethyl chloride.  With sterile technique and under real time ultrasound guidance:  Joint visualized.  21g 2 inch needle inserted posterior approach. Pictures taken for needle placement. Patient did  have injection of 2 cc of 0.5% Marcaine, and 1cc of Kenalog 40 mg/dL. Completed without difficulty  Pain immediately resolved suggesting accurate placement of the medication.  Advised to call if fevers/chills, erythema, induration, drainage, or persistent bleeding.  Images permanently stored and available for review in the ultrasound unit.  Impression: Technically successful ultrasound guided injection.  Impression and Recommendations:     This case required medical decision making of moderate complexity. The above documentation has been reviewed and is accurate and complete Lyndal Pulley, DO       Note: This dictation was prepared with Dragon dictation along with smaller phrase technology. Any transcriptional errors that result from this process are unintentional.

## 2018-11-16 NOTE — Assessment & Plan Note (Signed)
Patient given injection and tolerated the procedure well.  Discussed icing regimen and home exercise, we discussed the possible need for a custom brace but secondary to patient's other arthritic changes of the hands I do not think she will be able to put it on and off regularly.  Patient should do well though with this injection.  Could be a candidate for Visco supplementation but otherwise we will just see patient every 8 to 10 weeks for injections.

## 2018-11-17 ENCOUNTER — Encounter: Payer: Self-pay | Admitting: Family Medicine

## 2018-11-30 ENCOUNTER — Telehealth: Payer: Self-pay | Admitting: Internal Medicine

## 2018-11-30 NOTE — Telephone Encounter (Signed)
Patient has Medicare do it MUST go through a Medicare approved supplier.  Upon chart review, patient is not testing her blood sugar 4 times a day and therefore does not meet Medicare strict critria.  I will contact patient and let her know.

## 2018-11-30 NOTE — Telephone Encounter (Signed)
Would like to start the process of getting the DexCom 6. Her pharmacy is the [facility] on [address]  Call back number is [phone]

## 2018-12-01 NOTE — Telephone Encounter (Signed)
I spoke to patient and let her know this, she expressed understanding and will just stick with what she is using.

## 2018-12-04 ENCOUNTER — Other Ambulatory Visit: Payer: Self-pay | Admitting: Internal Medicine

## 2018-12-04 ENCOUNTER — Telehealth: Payer: Self-pay

## 2018-12-04 NOTE — Telephone Encounter (Signed)
4 options.  1. PT for both   2. For the knee can get her in for a gel injeciton.  3.  For the shoulder can try another injeciton in another part of shoulder 4. I could send in prednisone for 5 days but would need to watch blood sugars

## 2018-12-04 NOTE — Telephone Encounter (Signed)
Spoke with patient. She has had no relief from injection in shoulder and knee. Would like to know what the next step would be.

## 2018-12-05 ENCOUNTER — Encounter: Payer: Self-pay | Admitting: Internal Medicine

## 2018-12-05 ENCOUNTER — Ambulatory Visit (INDEPENDENT_AMBULATORY_CARE_PROVIDER_SITE_OTHER): Payer: Medicare Other | Admitting: Internal Medicine

## 2018-12-05 DIAGNOSIS — E11621 Type 2 diabetes mellitus with foot ulcer: Secondary | ICD-10-CM | POA: Insufficient documentation

## 2018-12-05 DIAGNOSIS — E08621 Diabetes mellitus due to underlying condition with foot ulcer: Secondary | ICD-10-CM

## 2018-12-05 DIAGNOSIS — L97419 Non-pressure chronic ulcer of right heel and midfoot with unspecified severity: Secondary | ICD-10-CM

## 2018-12-05 MED ORDER — SULFAMETHOXAZOLE-TRIMETHOPRIM 800-160 MG PO TABS: 1.0000 | ORAL_TABLET | Freq: Two times a day (BID) | ORAL | 0 refills | Status: DC

## 2018-12-05 NOTE — Assessment & Plan Note (Signed)
Appears to have surrounding cellulitis. Rx bactrim 1 week and needs follow up if not improving due to diabetes. Appears to have scab covered so unable to stage.

## 2018-12-05 NOTE — Telephone Encounter (Signed)
Left message for patient to call back  

## 2018-12-05 NOTE — Progress Notes (Signed)
Virtual Visit via Video Note  I connected with Kayla Thompson on 12/05/18 at 11:00 AM EDT by a video enabled telemedicine application and verified that I am speaking with the correct person using two identifiers.  The patient and the provider were at separate locations throughout the entire encounter.   I discussed the limitations of evaluation and management by telemedicine and the availability of in person appointments. The patient expressed understanding and agreed to proceed.  History of Present Illness: The patient is a 83 y.o. female with visit for wound on right foot and surrounding redness. Wound happened about 2 weeks ago. She has diabetes so they have been watching this closely. Had it covered for 1 week then uncovered for 1 week. Scab covering now. With new redness around the edge of the wound. She denies fevers. Some pain in the area. She is getting more redness as the day progresses and minimal swelling. Started about 2 weeks ago. Denies fevers or chills. Overall it is stable size but worsening redness. Has tried neosporin  Observations/Objective: Appearance: norma, right foot with about 1 cm round wound covered with scab, redness surrounding about 2-3 cm circular, breathing appears normal, normal grooming, abdomen does not appear distended, throat normal, mental status is A and O times 3  Assessment and Plan: See problem oriented charting  Follow Up Instructions: rx bactrim 7 days   I discussed the assessment and treatment plan with the patient. The patient was provided an opportunity to ask questions and all were answered. The patient agreed with the plan and demonstrated an understanding of the instructions.   The patient was advised to call back or seek an in-person evaluation if the symptoms worsen or if the condition fails to improve as anticipated.  [name], MD

## 2018-12-07 DIAGNOSIS — M79643 Pain in unspecified hand: Secondary | ICD-10-CM | POA: Diagnosis not present

## 2018-12-07 DIAGNOSIS — M25519 Pain in unspecified shoulder: Secondary | ICD-10-CM | POA: Diagnosis not present

## 2018-12-07 DIAGNOSIS — M797 Fibromyalgia: Secondary | ICD-10-CM | POA: Diagnosis not present

## 2018-12-07 DIAGNOSIS — Z7952 Long term (current) use of systemic steroids: Secondary | ICD-10-CM | POA: Diagnosis not present

## 2018-12-07 DIAGNOSIS — M2550 Pain in unspecified joint: Secondary | ICD-10-CM | POA: Diagnosis not present

## 2018-12-07 DIAGNOSIS — M1990 Unspecified osteoarthritis, unspecified site: Secondary | ICD-10-CM | POA: Diagnosis not present

## 2018-12-07 DIAGNOSIS — Z8739 Personal history of other diseases of the musculoskeletal system and connective tissue: Secondary | ICD-10-CM | POA: Diagnosis not present

## 2018-12-07 DIAGNOSIS — R700 Elevated erythrocyte sedimentation rate: Secondary | ICD-10-CM | POA: Diagnosis not present

## 2018-12-07 DIAGNOSIS — M810 Age-related osteoporosis without current pathological fracture: Secondary | ICD-10-CM | POA: Diagnosis not present

## 2018-12-12 ENCOUNTER — Encounter: Payer: Self-pay | Admitting: Internal Medicine

## 2018-12-12 NOTE — Progress Notes (Signed)
error 

## 2018-12-15 ENCOUNTER — Telehealth: Payer: Self-pay | Admitting: Internal Medicine

## 2018-12-18 ENCOUNTER — Other Ambulatory Visit: Payer: Self-pay | Admitting: Internal Medicine

## 2018-12-18 NOTE — Telephone Encounter (Signed)
Pt scheduled for 02/26/2019-pr

## 2018-12-19 ENCOUNTER — Telehealth: Payer: Self-pay | Admitting: *Deleted

## 2018-12-19 NOTE — Telephone Encounter (Signed)
Pt received steroid injections in her left knee & left shoulder on 11/16/2018. Pt stated those injections did not last and would like to know what the next step is.

## 2018-12-19 NOTE — Telephone Encounter (Signed)
Consider prp which does cost $475 Or consider surgery for the arthritis.  Will not be doing chronic narcotics.

## 2018-12-20 ENCOUNTER — Encounter: Payer: Self-pay | Admitting: Gynecology

## 2018-12-20 ENCOUNTER — Other Ambulatory Visit: Payer: Self-pay

## 2018-12-20 ENCOUNTER — Ambulatory Visit (INDEPENDENT_AMBULATORY_CARE_PROVIDER_SITE_OTHER): Payer: Medicare Other | Admitting: Gynecology

## 2018-12-20 VITALS — BP 130/80 | Ht 59.0 in | Wt 147.0 lb

## 2018-12-20 DIAGNOSIS — M810 Age-related osteoporosis without current pathological fracture: Secondary | ICD-10-CM

## 2018-12-20 DIAGNOSIS — Z01419 Encounter for gynecological examination (general) (routine) without abnormal findings: Secondary | ICD-10-CM

## 2018-12-20 DIAGNOSIS — N952 Postmenopausal atrophic vaginitis: Secondary | ICD-10-CM

## 2018-12-20 DIAGNOSIS — Z8543 Personal history of malignant neoplasm of ovary: Secondary | ICD-10-CM

## 2018-12-20 NOTE — Telephone Encounter (Signed)
Talked to patient. States she would like to think about PRP.

## 2018-12-20 NOTE — Patient Instructions (Signed)
Follow-up in 1 year for annual exam 

## 2018-12-20 NOTE — Progress Notes (Signed)
    Kayla Thompson 01-01-1936 [id number]        83 y.o.  G1P1 for breast and pelvic exam.  Without gynecologic complaints  Past medical history,surgical history, problem list, medications, allergies, family history and social history were all reviewed and documented as reviewed in the EPIC chart.  ROS:  Performed with pertinent positives and negatives included in the history, assessment and plan.   Additional significant findings : None   Exam: Caryn Bee assistant Vitals:   12/20/18 1150  BP: 130/80  Weight: 147 lb (66.7 kg)  Height: 4\' 11"  (1.499 m)   Body mass index is 29.69 kg/m.  General appearance:  Normal affect, orientation and appearance. Skin: Grossly normal HEENT: Without gross lesions.  No cervical or supraclavicular adenopathy. Thyroid normal.  Lungs:  Clear without wheezing, rales or rhonchi Cardiac: RR, without RMG Abdominal:  Soft, nontender, without masses, guarding, rebound, organomegaly or hernia Breasts:  Examined lying and sitting without masses, retractions, discharge or axillary adenopathy. Pelvic:  Ext, BUS, Vagina: With atrophic changes  Adnexa: Without masses or tenderness    Anus and perineum: Normal   Rectovaginal: Normal sphincter tone without palpated masses or tenderness.    Assessment/Plan:  83 y.o. G1P1 female for breast and pelvic exam  1. Postmenopausal.  No significant menopausal symptoms. 2. History of ovarian cancer stage I 1996.  Exam NED.  Had been followed previously with annual CA 125's.  We had discussed this previously and the patient had declined ongoing CA 125 screening.  She understands the issues of missed disease.  Exam NED. 3. Mammography coming due in October and she will schedule this.  Breast exam normal today. 4. Osteoporosis.  Currently on Prolia through her primary physician's office.  We will continue to follow-up with them in reference to bone health.  Last bone density reported 2019. 5. Colonoscopy 5 years ago.   Repeat at their recommended interval. 6. Pap smear/HPV 2016.  No Pap smear done today.  No history of significant abnormal Pap smears.  Plan stop screening per current screening guidelines and patient is comfortable with this. 7. Health maintenance.  No routine lab work done as patient does this elsewhere.  Follow-up 1 year, sooner as needed.   Anastasio Auerbach MD, 12:33 PM 12/20/2018

## 2018-12-26 ENCOUNTER — Ambulatory Visit (INDEPENDENT_AMBULATORY_CARE_PROVIDER_SITE_OTHER): Payer: Medicare Other | Admitting: Gastroenterology

## 2018-12-26 ENCOUNTER — Encounter: Payer: Self-pay | Admitting: Gastroenterology

## 2018-12-26 VITALS — BP 120/70 | HR 72 | Temp 98.4°F | Ht 59.0 in | Wt 151.0 lb

## 2018-12-26 DIAGNOSIS — K219 Gastro-esophageal reflux disease without esophagitis: Secondary | ICD-10-CM | POA: Diagnosis not present

## 2018-12-26 DIAGNOSIS — R1310 Dysphagia, unspecified: Secondary | ICD-10-CM | POA: Diagnosis not present

## 2018-12-26 DIAGNOSIS — R933 Abnormal findings on diagnostic imaging of other parts of digestive tract: Secondary | ICD-10-CM | POA: Diagnosis not present

## 2018-12-26 DIAGNOSIS — R6889 Other general symptoms and signs: Secondary | ICD-10-CM

## 2018-12-26 DIAGNOSIS — Z860100 Personal history of colon polyps, unspecified: Secondary | ICD-10-CM

## 2018-12-26 DIAGNOSIS — R0989 Other specified symptoms and signs involving the circulatory and respiratory systems: Secondary | ICD-10-CM

## 2018-12-26 MED ORDER — OMEPRAZOLE 40 MG PO CPDR: 40.0000 mg | DELAYED_RELEASE_CAPSULE | Freq: Two times a day (BID) | ORAL | 2 refills | Status: DC

## 2018-12-26 NOTE — Patient Instructions (Signed)
You have been scheduled for an endoscopy. Please follow written instructions given to you at your visit today. If you use inhalers (even only as needed), please bring them with you on the day of your procedure.  Increase your Omeprazole to 40mg  twice daily. New Rx has been sent.   We have sent the following medications to your pharmacy for you to pick up at your convenience: Omeprazole   If you are age 81 or older, your body mass index should be between 23-30. Your Body mass index is 30.5 kg/m. If this is out of the aforementioned range listed, please consider follow up with your Primary Care Provider.   Thank you for choosing me and [facility].  Dr. Rush Landmark

## 2018-12-26 NOTE — Progress Notes (Signed)
[facility] VISIT   Primary Care Provider [name], MD [place] [address] [phone]  Referring Provider Leta Baptist, MD [address] [place],  [place] [address] [phone]  Patient Profile: Kayla Thompson is a 83 y.o. female with a pmh significant for diabetes, asthma, arthritis, osteoporosis, prior ovarian cancer, fibromyalgia, hypertension, GERD.  The patient presents to the [facility] for an evaluation and management of problem(s) noted below:  Problem List 1. Gastroesophageal reflux disease without esophagitis   2. Abnormal barium swallow   3. Dysphagia, unspecified type   4. Throat clearing   5. History of colonic polyps     History of Present Illness This is the patient's first visit to the outpatient [facility] clinic.  She has been referred by ENT for further evaluation.  She recently was seen by ENT in August of this year and the patient underwent a nasal endoscopy with no polyps, mass, lesions being noted.  Vocal cords were mobile without mass or lesion.  Moderate posterior laryngeal edema noted.  She was recommended a barium swallow for further evaluation of her dysphagia.  This was completed with results as below showing a hiatal hernia as well as findings of a possible motility disorder.  Today, the patient is accompanied by her caregiver as well as her daughter on the phone.  The patient states that she has been having issues with swallowing solids for a long period of time.  But she also notices issues with sugar-free ice cream.  She has no problem with liquids.  She has not had any loss of appetite.  She has had a chronic throat clearing with phlegm production that "pours out of her mouth".  The patient deals with alternating bowel habits of diarrhea constipation but only a few times per month otherwise pretty regular.  She has been noted per her report of having diverticulosis without  diverticulitis.  She has had hemorrhoidal bleeding per her report.  Her weight has been stable.  She has had heartburn for years.  She is on PPI therapy but only takes once daily.  She has had colon polyps removed in the past.  She denies any odynophagia.  She has had an upper endoscopy but it has been greater than 10 years when she was in [place].  She has never had a dilation per her report.  GI Review of Systems Positive as above Negative for abdominal pain, nausea, vomiting, early satiety, melena  Review of Systems General: Denies fevers/chills HEENT: Denies oral lesions Cardiovascular: Denies chest pain/palpitations Pulmonary: Denies shortness of breath/nocturnal cough Gastroenterological: See HPI Genitourinary: Denies darkened urine Hematological: Denies easy bruising/bleeding Endocrine: Denies temperature intolerance Dermatological: Denies jaundice Psychological: Mood is stable   Medications Current Outpatient Medications  Medication Sig Dispense Refill   albuterol (PROVENTIL HFA;VENTOLIN HFA) 108 (90 Base) MCG/ACT inhaler Inhale 2 puffs into the lungs every 6 (six) hours as needed for wheezing or shortness of breath. 1 Inhaler 11   amitriptyline (ELAVIL) 10 MG tablet Take 1 tablet (10 mg total) by mouth at bedtime. 90 tablet 1   baclofen (LIORESAL) 10 MG tablet TAKE 0.5-1 TABLET BY MOUTH TWICE DAILY AS NEEDED FOR MUSCLE SPASMS OR PAIN 180 tablet 1   BD PEN NEEDLE NANO U/F 32G X 4 MM MISC USE 4 TIMES A DAY 400 each 1   busPIRone (BUSPAR) 5 MG tablet TAKE 1 TO 2 TABLETS BY MOUTH TWICE A DAY 360 tablet  1   calcium carbonate (OSCAL) 1500 (600 Ca) MG TABS tablet Take 1,500 mg by mouth daily.     carvedilol (COREG) 6.25 MG tablet TAKE 1/2 TABLET BY MOUTH 2 (TWO) TIMES DAILY WITH A MEAL. 90 tablet 3   Cholecalciferol (VITAMIN D3) 2000 units TABS Take 2 tablets by mouth daily.      denosumab (PROLIA) 60 MG/ML SOLN injection Inject 60 mg into the skin every 6 (six) months.  Administer in upper arm, thigh, or abdomen     Dextromethorphan-Guaifenesin (MUCINEX DM MAXIMUM STRENGTH) 60-1200 MG TB12 Take 1 tablet by mouth 2 (two) times daily. Reported on 06/23/2015     donepezil (ARICEPT) 5 MG tablet TAKE 1 TABLET BY MOUTH EVERYDAY AT BEDTIME     DULoxetine (CYMBALTA) 30 MG capsule TAKE 1 CAPSULE BY MOUTH EVERY DAY 90 capsule 0   fluticasone (FLONASE) 50 MCG/ACT nasal spray Place 2 sprays into both nostrils daily. 48 g 3   Fluticasone-Salmeterol (ADVAIR DISKUS) 500-50 MCG/DOSE AEPB Inhale 1 puff into the lungs 2 (two) times a day. Rinse mouth 60 each 12   glucose blood (FREESTYLE TEST STRIPS) test strip Use to test blood sugar 3 times daily. Dx: E11.65 100 each 3   Insulin Glargine (LANTUS SOLOSTAR) 100 UNIT/ML Solostar Pen Inject 20 Units into the skin every morning. (Patient taking differently: Inject 15 Units into the skin every morning. ) 27 pen 1   Lancets (FREESTYLE) lancets Use to test blood sugar 3 times daily. Dx: E11.65 100 each 3   levothyroxine (SYNTHROID) 50 MCG tablet TAKE 1 TABLET BY MOUTH EVERY DAY BEFORE BREAKFAST 90 tablet 1   magnesium oxide (MAG-OX) 400 MG tablet TAKE 1 TABLET BY MOUTH EVERY DAY 90 tablet 1   montelukast (SINGULAIR) 10 MG tablet TAKE 1 TABLET BY MOUTH EVERY DAY 90 tablet 0   Multiple Vitamins-Minerals (CENTRUM ADULTS PO) Take by mouth.     Polyethyl Glycol-Propyl Glycol (SYSTANE FREE OP) Apply 1 drop to eye 3 (three) times daily.     predniSONE (DELTASONE) 1 MG tablet Take 2 tablets (2 mg total) by mouth daily. 60 tablet 1   rosuvastatin (CRESTOR) 10 MG tablet TAKE 1 TABLET (10 MG TOTAL) BY MOUTH EVERY EVENING. 90 tablet 2   sulfamethoxazole-trimethoprim (BACTRIM DS) 800-160 MG tablet Take 1 tablet by mouth 2 (two) times daily. 14 tablet 0   tiotropium (SPIRIVA HANDIHALER) 18 MCG inhalation capsule PLACE 1 CAPSULE INTO INHALER AND INHALE AS DIRECTED EVERY DAY 30 capsule 12   traMADol (ULTRAM) 50 MG tablet      UNABLE TO  FIND Take 1,000 mg by mouth daily. Med Name: Tumeric     UNABLE TO FIND Take 1,000 mg by mouth daily. Med Name: Zannie [place]     vitamin B-12 (CYANOCOBALAMIN) 250 MCG tablet Take 250 mcg by mouth daily.     omeprazole (PRILOSEC) 40 MG capsule Take 1 capsule (40 mg total) by mouth 2 (two) times daily. 60 capsule 2   No current facility-administered medications for this visit.     Allergies Allergies  Allergen Reactions   Latex Rash   Penicillins Rash    Has patient had a PCN reaction causing immediate rash, facial/tongue/throat swelling, SOB or lightheadedness with hypotension: No Has patient had a PCN reaction causing severe rash involving mucus membranes or skin necrosis: No Has patient had a PCN reaction that required hospitalization: No Has patient had a PCN reaction occurring within the last 10 years: No If all of the above answers  are "NO", then may proceed with Cephalosporin use.     Histories Past Medical History:  Diagnosis Date   Arthritis    Asthma    Diabetes mellitus without complication (HCC)    Fibromyalgia    GERD (gastroesophageal reflux disease)    History of chemotherapy    History of fractured pelvis    Hypertension    Osteoporosis 12/2017   T score -2.8 stable from prior DEXA   Osteoporosis    Ovarian cancer ([place])    Past Surgical History:  Procedure Laterality Date   ABDOMINAL HYSTERECTOMY  1996   ANKLE FRACTURE SURGERY     plate and screws   BLADDER SUSPENSION     CATARACT EXTRACTION     EYE SURGERY     hip surgey     knee surgey     LUNG SURGERY     OOPHORECTOMY     BSO   Social History   Socioeconomic History   Marital status: Widowed    Spouse name: Not on file   Number of children: 2   Years of education: Not on file   Highest education level: Not on file  Occupational History   Not on file  Social Needs   Financial resource strain: Not hard at all   Food insecurity    Worry: Never true     Inability: Never true   Transportation needs    Medical: No    Non-medical: No  Tobacco Use   Smoking status: Never Smoker   Smokeless tobacco: Never Used  Substance and Sexual Activity   Alcohol use: No    Alcohol/week: 0.0 standard drinks   Drug use: No   Sexual activity: Not Currently    Comment: declined sexual Hx questions  Lifestyle   Physical activity    Days per week: 0 days    Minutes per session: 0 min   Stress: Not at all  Relationships   Social connections    Talks on phone: More than three times a week    Gets together: More than three times a week    Attends religious service: 1 to 4 times per year    Active member of club or organization: Yes    Attends meetings of clubs or organizations: 1 to 4 times per year    Relationship status: Widowed   Intimate partner violence    Fear of current or ex partner: Not on file    Emotionally abused: Not on file    Physically abused: Not on file    Forced sexual activity: Not on file  Other Topics Concern   Not on file  Social History Narrative   Not on file   Family History  Problem Relation Age of Onset   Lung cancer Mother    CAD Father    Diabetes Father    Lung cancer Maternal Aunt    Diabetes Paternal Aunt    Diabetes Paternal Uncle    Diabetes Paternal Grandmother    Diabetes Paternal Grandfather    Colon cancer Neg Hx    Esophageal cancer Neg Hx    Inflammatory bowel disease Neg Hx    Liver disease Neg Hx    Pancreatic cancer Neg Hx    Rectal cancer Neg Hx    Stomach cancer Neg Hx    I have reviewed her medical, social, and family history in detail and updated the electronic medical record as necessary.    PHYSICAL EXAMINATION  BP 120/70  Pulse 72    Temp 98.4 F (36.9 C)    Ht 4\' 11"  (1.499 m)    Wt 151 lb (68.5 kg)    BMI 30.50 kg/m  Wt Readings from Last 3 Encounters:  12/26/18 151 lb (68.5 kg)  12/20/18 147 lb (66.7 kg)  10/12/18 147 lb (66.7 kg)  GEN: NAD,  appears stated age, doesn't appear chronically ill, accompanied by caregiver PSYCH: Cooperative, without pressured speech EYE: Conjunctivae pink, sclerae anicteric ENT: MMM, without oral ulcers, no erythema or exudates noted NECK: Supple CV: RR without R/Gs  RESP: Some wheezing appreciated GI: NABS, soft, NT/ND, without rebound or guarding, no HSM appreciated MSK/EXT: Trace bilateral lower extremity edema SKIN: No jaundice NEURO:  Alert & Oriented x 3, no focal deficits   REVIEW OF DATA  I reviewed the following data at the time of this encounter:  GI Procedures and Studies  2010 endoscopy Moderate esophagitis was found.  There was no evidence for Candida.  This was associated with linear ulceration, more serpiginous proximally.  These changes extended from 32 cm which was the Z line at 28 cm from the incisors.  Biopsies were taken.  The LES was open.  Mild gastritis was found.  This was diffuse.  There is a large 8 cm hiatal hernia.  Antral contractions were absent but there was no excessive fluid in the stomach nor food material.  Duodenal bulb was normal in appearance as was the post bulbar duodenum.  Reported colonoscopy within the last 5 to 7 years done in [place] but patient unaware of who did it  Laboratory Studies  Reviewed those in epic  Imaging Studies  August 2020 esophagram IMPRESSION: 1. Nonspecific esophageal dysmotility disorder, with considerable tertiary contractions distally in the esophagus. 2. Mild distal esophageal fold thickening may reflect esophagitis. No ulcer identified. No stricture observed. 3. Small type 1 hiatal hernia.   ASSESSMENT  Ms. Stapleford is a 83 y.o. female with a pmh significant for diabetes, asthma, arthritis, osteoporosis, prior ovarian cancer, fibromyalgia, hypertension, GERD.  The patient is seen today for evaluation and management of:  1. Gastroesophageal reflux disease without esophagitis   2. Abnormal barium swallow   3. Dysphagia,  unspecified type   4. Throat clearing   5. History of colonic polyps    The patient is hemodynamically stable.  Based on the patient's symptoms I believe she has significant heartburn that is probably playing a role with things.  Report of the barium swallow suggest some esophageal thickening and a hiatal hernia.  The patient will benefit from an EGD with esophageal and gastric biopsies as well as dilation empirically if no overt strictures found.  We will increase her PPI to 40 twice daily try and optimize her heartburn symptoms and see how she does with her dysphagia as well.  I suspect her throat clearing is a result of persistent acid reflux we will see how she does with increased acid reducing medication.  We want to make sure that there is no mass or lesion which is another reason why an endoscopy will be helpful.  She has a history of colon polyps and was told that she needed a 5-year follow-up I am willing to move forward with one final colonoscopy for her.  After discussion with the patient and her daughter they would like to move forward with both procedures.  The risks and benefits of endoscopic evaluation were discussed with the patient; these include but are not limited to the risk of  perforation, infection, bleeding, missed lesions, lack of diagnosis, severe illness requiring hospitalization, as well as anesthesia and sedation related illnesses.  The patient is agreeable to proceed.  All patient questions were answered, to the best of my ability, and the patient agrees to the aforementioned plan of action with follow-up as indicated.   PLAN  Increase omeprazole to 40 mg twice daily Diagnostic endoscopy with dilation Surveillance colonoscopy Manometry to be considered based on results of EGD and dilation and if patient's symptoms persist Further clarification of hiatal hernia   Orders Placed This Encounter  Procedures   Procedural/ Surgical Case Request: ESOPHAGOGASTRODUODENOSCOPY  (EGD) WITH PROPOFOL with dialtion   Ambulatory referral to Gastroenterology    New Prescriptions   OMEPRAZOLE (PRILOSEC) 40 MG CAPSULE    Take 1 capsule (40 mg total) by mouth 2 (two) times daily.   Modified Medications   No medications on file    Planned Follow Up No follow-ups on file.   Justice Britain, MD [facility] # [id number]

## 2018-12-27 ENCOUNTER — Encounter: Payer: Self-pay | Admitting: Gastroenterology

## 2018-12-28 ENCOUNTER — Encounter: Payer: Self-pay | Admitting: Gynecology

## 2018-12-28 DIAGNOSIS — R0989 Other specified symptoms and signs involving the circulatory and respiratory systems: Secondary | ICD-10-CM | POA: Insufficient documentation

## 2018-12-28 DIAGNOSIS — R6889 Other general symptoms and signs: Secondary | ICD-10-CM | POA: Insufficient documentation

## 2018-12-28 DIAGNOSIS — Z860100 Personal history of colon polyps, unspecified: Secondary | ICD-10-CM | POA: Insufficient documentation

## 2018-12-28 DIAGNOSIS — R933 Abnormal findings on diagnostic imaging of other parts of digestive tract: Secondary | ICD-10-CM | POA: Insufficient documentation

## 2018-12-28 DIAGNOSIS — R1310 Dysphagia, unspecified: Secondary | ICD-10-CM | POA: Insufficient documentation

## 2018-12-29 ENCOUNTER — Encounter: Payer: Medicare Other | Admitting: Internal Medicine

## 2019-01-02 DIAGNOSIS — M1712 Unilateral primary osteoarthritis, left knee: Secondary | ICD-10-CM | POA: Diagnosis not present

## 2019-01-02 DIAGNOSIS — M25512 Pain in left shoulder: Secondary | ICD-10-CM | POA: Diagnosis not present

## 2019-01-02 DIAGNOSIS — M25562 Pain in left knee: Secondary | ICD-10-CM | POA: Diagnosis not present

## 2019-01-02 DIAGNOSIS — M19012 Primary osteoarthritis, left shoulder: Secondary | ICD-10-CM | POA: Diagnosis not present

## 2019-01-04 ENCOUNTER — Encounter: Payer: Medicare Other | Admitting: Internal Medicine

## 2019-01-08 ENCOUNTER — Encounter: Payer: Self-pay | Admitting: Internal Medicine

## 2019-01-08 ENCOUNTER — Other Ambulatory Visit: Payer: Self-pay

## 2019-01-08 ENCOUNTER — Other Ambulatory Visit (INDEPENDENT_AMBULATORY_CARE_PROVIDER_SITE_OTHER): Payer: Medicare Other

## 2019-01-08 ENCOUNTER — Ambulatory Visit (INDEPENDENT_AMBULATORY_CARE_PROVIDER_SITE_OTHER): Payer: Medicare Other | Admitting: Internal Medicine

## 2019-01-08 VITALS — BP 130/70 | HR 68 | Temp 97.7°F | Ht 59.0 in | Wt 149.0 lb

## 2019-01-08 DIAGNOSIS — E08621 Diabetes mellitus due to underlying condition with foot ulcer: Secondary | ICD-10-CM

## 2019-01-08 DIAGNOSIS — Z23 Encounter for immunization: Secondary | ICD-10-CM

## 2019-01-08 DIAGNOSIS — L97411 Non-pressure chronic ulcer of right heel and midfoot limited to breakdown of skin: Secondary | ICD-10-CM | POA: Diagnosis not present

## 2019-01-08 DIAGNOSIS — F411 Generalized anxiety disorder: Secondary | ICD-10-CM

## 2019-01-08 DIAGNOSIS — Z794 Long term (current) use of insulin: Secondary | ICD-10-CM

## 2019-01-08 DIAGNOSIS — E1165 Type 2 diabetes mellitus with hyperglycemia: Secondary | ICD-10-CM

## 2019-01-08 DIAGNOSIS — Z0001 Encounter for general adult medical examination with abnormal findings: Secondary | ICD-10-CM

## 2019-01-08 DIAGNOSIS — I10 Essential (primary) hypertension: Secondary | ICD-10-CM

## 2019-01-08 DIAGNOSIS — Z0000 Encounter for general adult medical examination without abnormal findings: Secondary | ICD-10-CM

## 2019-01-08 LAB — MICROALBUMIN / CREATININE URINE RATIO
Creatinine,U: 58.6 mg/dL
Microalb Creat Ratio: 7.8 mg/g (ref 0.0–30.0)
Microalb, Ur: 4.6 mg/dL — ABNORMAL HIGH (ref 0.0–1.9)

## 2019-01-08 LAB — HEMOGLOBIN A1C: Hgb A1c MFr Bld: 6.8 % — ABNORMAL HIGH (ref 4.6–6.5)

## 2019-01-08 NOTE — Patient Instructions (Signed)
Health Maintenance, Female Adopting a healthy lifestyle and getting preventive care are important in promoting health and wellness. Ask your health care provider about:  The right schedule for you to have regular tests and exams.  Things you can do on your own to prevent diseases and keep yourself healthy. What should I know about diet, weight, and exercise? Eat a healthy diet   Eat a diet that includes plenty of vegetables, fruits, low-fat dairy products, and lean protein.  Do not eat a lot of foods that are high in solid fats, added sugars, or sodium. Maintain a healthy weight Body mass index (BMI) is used to identify weight problems. It estimates body fat based on height and weight. Your health care provider can help determine your BMI and help you achieve or maintain a healthy weight. Get regular exercise Get regular exercise. This is one of the most important things you can do for your health. Most adults should:  Exercise for at least 150 minutes each week. The exercise should increase your heart rate and make you sweat (moderate-intensity exercise).  Do strengthening exercises at least twice a week. This is in addition to the moderate-intensity exercise.  Spend less time sitting. Even light physical activity can be beneficial. Watch cholesterol and blood lipids Have your blood tested for lipids and cholesterol at 83 years of age, then have this test every 5 years. Have your cholesterol levels checked more often if:  Your lipid or cholesterol levels are high.  You are older than 81 years of age.  You are at high risk for heart disease. What should I know about cancer screening? Depending on your health history and family history, you may need to have cancer screening at various ages. This may include screening for:  Breast cancer.  Cervical cancer.  Colorectal cancer.  Skin cancer.  Lung cancer. What should I know about heart disease, diabetes, and high blood  pressure? Blood pressure and heart disease  High blood pressure causes heart disease and increases the risk of stroke. This is more likely to develop in people who have high blood pressure readings, are of African descent, or are overweight.  Have your blood pressure checked: ? Every 3-5 years if you are 18-39 years of age. ? Every year if you are 40 years old or older. Diabetes Have regular diabetes screenings. This checks your fasting blood sugar level. Have the screening done:  Once every three years after age 40 if you are at a normal weight and have a low risk for diabetes.  More often and at a younger age if you are overweight or have a high risk for diabetes. What should I know about preventing infection? Hepatitis B If you have a higher risk for hepatitis B, you should be screened for this virus. Talk with your health care provider to find out if you are at risk for hepatitis B infection. Hepatitis C Testing is recommended for:  Everyone born from 1945 through 1965.  Anyone with known risk factors for hepatitis C. Sexually transmitted infections (STIs)  Get screened for STIs, including gonorrhea and chlamydia, if: ? You are sexually active and are younger than 81 years of age. ? You are older than 81 years of age and your health care provider tells you that you are at risk for this type of infection. ? Your sexual activity has changed since you were last screened, and you are at increased risk for chlamydia or gonorrhea. Ask your health care provider if   you are at risk.  Ask your health care provider about whether you are at high risk for HIV. Your health care provider may recommend a prescription medicine to help prevent HIV infection. If you choose to take medicine to prevent HIV, you should first get tested for HIV. You should then be tested every 3 months for as long as you are taking the medicine. Pregnancy  If you are about to stop having your period (premenopausal) and  you may become pregnant, seek counseling before you get pregnant.  Take 400 to 800 micrograms (mcg) of folic acid every day if you become pregnant.  Ask for birth control (contraception) if you want to prevent pregnancy. Osteoporosis and menopause Osteoporosis is a disease in which the bones lose minerals and strength with aging. This can result in bone fractures. If you are 65 years old or older, or if you are at risk for osteoporosis and fractures, ask your health care provider if you should:  Be screened for bone loss.  Take a calcium or vitamin D supplement to lower your risk of fractures.  Be given hormone replacement therapy (HRT) to treat symptoms of menopause. Follow these instructions at home: Lifestyle  Do not use any products that contain nicotine or tobacco, such as cigarettes, e-cigarettes, and chewing tobacco. If you need help quitting, ask your health care provider.  Do not use street drugs.  Do not share needles.  Ask your health care provider for help if you need support or information about quitting drugs. Alcohol use  Do not drink alcohol if: ? Your health care provider tells you not to drink. ? You are pregnant, may be pregnant, or are planning to become pregnant.  If you drink alcohol: ? Limit how much you use to 0-1 drink a day. ? Limit intake if you are breastfeeding.  Be aware of how much alcohol is in your drink. In the [place] one drink equals one 12 oz bottle of beer (355 mL), one 5 oz glass of wine (148 mL), or one 1 oz glass of hard liquor (44 mL). General instructions  Schedule regular health, dental, and eye exams.  Stay current with your vaccines.  Tell your health care provider if: ? You often feel depressed. ? You have ever been abused or do not feel safe at home. Summary  Adopting a healthy lifestyle and getting preventive care are important in promoting health and wellness.  Follow your health care provider's instructions about healthy  diet, exercising, and getting tested or screened for diseases.  Follow your health care provider's instructions on monitoring your cholesterol and blood pressure. This information is not intended to replace advice given to you by your health care provider. Make sure you discuss any questions you have with your health care provider. Document Released: 09/21/2010 Document Revised: 03/01/2018 Document Reviewed: 03/01/2018 Elsevier Patient Education  2020 [employer]  

## 2019-01-08 NOTE — Progress Notes (Signed)
Subjective:   Patient ID: Kayla Thompson, female    DOB: 04-15-1935, 83 y.o.   MRN: [id number]  HPI Here for medicare wellness, no new complaints. Please see A/P for status and treatment of chronic medical problems.   HPI #2: Here for follow up diabetes (seeing endo for medications, complicated, denies worsening, denies low sugars, still on prednisone chronically, does have neuropathy which is stable) and foot ulcer (on the right foot, finished antibiotics in September, wound is closing but not closed yet, denies pain or burning or itching in the area, redness dramatically decreased after antibiotics) and anxiety (using buspar, has tramadol some for pain, denies worsening, the pandemic has been hard adjustment for her, denies SI/HI, overall reasonably satisfied with level of control, frustrated by her physical health problems which do contribute to her anxiety).   Diet: DM since diabetic Physical activity: sedentary Depression/mood screen: negative Hearing: intact to whispered voice, moderate loss bilateral Visual acuity: grossly normal, performs annual eye exam  ADLs: capable Fall risk: medium Home safety: good Cognitive evaluation: intact to orientation, naming, recall and repetition EOL planning: adv directives discussed    Office Visit from 01/08/2019 in [facility]  PHQ-2 Total Score  1        Clinical Support from 11/29/2017 in [facility]  PHQ-9 Total Score  5     I have personally reviewed and have noted 1. The patient's medical and social history - reviewed today no changes 2. Their use of alcohol, tobacco or illicit drugs 3. Their current medications and supplements 4. The patient's functional ability including ADL's, fall risks, home safety risks and hearing or visual impairment. 5. Diet and physical activities 6. Evidence for depression or mood disorders 7. Care team reviewed and updated  Patient Care Team:  [name], MD as PCP - General (Internal Medicine) Phineas Real, Belinda Block, MD as Consulting Physician (Gynecology) Noralee Space, MD as Consulting Physician (Pulmonary Disease) Rutherford Guys, MD as Consulting Physician (Ophthalmology) Philemon Kingdom, MD as Consulting Physician (Internal Medicine) Past Medical History:  Diagnosis Date  . Arthritis   . Asthma   . Diabetes mellitus without complication ([place])   . Fibromyalgia   . GERD (gastroesophageal reflux disease)   . History of chemotherapy   . History of fractured pelvis   . Hypertension   . Osteoporosis 12/2017   T score -2.8 stable from prior DEXA  . Osteoporosis   . Ovarian cancer [facility])    Past Surgical History:  Procedure Laterality Date  . ABDOMINAL HYSTERECTOMY  1996  . ANKLE FRACTURE SURGERY     plate and screws  . BLADDER SUSPENSION    . CATARACT EXTRACTION    . EYE SURGERY    . hip surgey    . knee surgey    . LUNG SURGERY    . OOPHORECTOMY     BSO   Family History  Problem Relation Age of Onset  . Lung cancer Mother   . CAD Father   . Diabetes Father   . Lung cancer Maternal Aunt   . Diabetes Paternal Aunt   . Diabetes Paternal Uncle   . Diabetes Paternal Grandmother   . Diabetes Paternal Grandfather   . Colon cancer Neg Hx   . Esophageal cancer Neg Hx   . Inflammatory bowel disease Neg Hx   . Liver disease Neg Hx   . Pancreatic cancer Neg Hx   . Rectal cancer Neg Hx   .  Stomach cancer Neg Hx     Review of Systems  Constitutional: Negative.   HENT: Negative.   Eyes: Negative.   Respiratory: Positive for cough and shortness of breath. Negative for chest tightness.   Cardiovascular: Negative for chest pain, palpitations and leg swelling.  Gastrointestinal: Negative for abdominal distention, abdominal pain, constipation, diarrhea, nausea and vomiting.  Musculoskeletal: Positive for arthralgias, back pain and gait problem.  Skin: Negative.   Neurological: Positive for numbness.  Negative for weakness.  Psychiatric/Behavioral: Positive for dysphoric mood and sleep disturbance. Negative for self-injury and suicidal ideas. The patient is nervous/anxious.     Objective:  Physical Exam Constitutional:      Appearance: She is well-developed.  HENT:     Head: Normocephalic and atraumatic.  Neck:     Musculoskeletal: Normal range of motion.  Cardiovascular:     Rate and Rhythm: Normal rate and regular rhythm.  Pulmonary:     Effort: Pulmonary effort is normal. No respiratory distress.     Breath sounds: Wheezing present. No rales.     Comments: Stable lung exam Abdominal:     General: Bowel sounds are normal. There is no distension.     Palpations: Abdomen is soft.     Tenderness: There is no abdominal tenderness. There is no rebound.  Musculoskeletal:        General: Tenderness present.  Skin:    General: Skin is warm and dry.  Neurological:     Mental Status: She is alert and oriented to person, place, and time.     Coordination: Coordination abnormal.     Comments: walker     Vitals:   01/08/19 1341  BP: 130/70  Pulse: 68  Temp: 97.7 F (36.5 C)  TempSrc: Oral  Weight: 149 lb (67.6 kg)  Height: 4\' 11"  (1.499 m)    Assessment & Plan:  Flu shot given at visit

## 2019-01-09 DIAGNOSIS — Z0001 Encounter for general adult medical examination with abnormal findings: Secondary | ICD-10-CM | POA: Insufficient documentation

## 2019-01-09 NOTE — Assessment & Plan Note (Signed)
Flu shot given. Pneumonia complete. Shingrix counseled. Tetanus due 2029. Colonoscopy aged out. Mammogram aged out, pap smear aged out and dexa due 2021. Counseled about sun safety and mole surveillance. Counseled about the dangers of distracted driving. Given 10 year screening recommendations.

## 2019-01-09 NOTE — Assessment & Plan Note (Signed)
BP at goal on current regimen of coreg. Recent BMP without need for adjustment.

## 2019-01-09 NOTE — Assessment & Plan Note (Signed)
Resolving but not healed fully. There are no signs of infection. Given her poor circulation this may take some time but overall improving. Will need monitoring again.

## 2019-01-09 NOTE — Assessment & Plan Note (Signed)
Stable with buspar, she should stay away from xanax as she is high fall risk and has some confusion issues with over medication in the past.

## 2019-01-09 NOTE — Assessment & Plan Note (Signed)
Checking HgA1c as due so she can hopefully do virtual with endo given high risk for covid-19. Foot exam done. Checking microalbumin to creatinine ratio and reminded about yearly eye exam.

## 2019-01-10 DIAGNOSIS — M542 Cervicalgia: Secondary | ICD-10-CM | POA: Diagnosis not present

## 2019-01-10 DIAGNOSIS — M25551 Pain in right hip: Secondary | ICD-10-CM | POA: Diagnosis not present

## 2019-01-10 DIAGNOSIS — M25571 Pain in right ankle and joints of right foot: Secondary | ICD-10-CM | POA: Diagnosis not present

## 2019-01-10 DIAGNOSIS — M545 Low back pain: Secondary | ICD-10-CM | POA: Diagnosis not present

## 2019-01-11 ENCOUNTER — Other Ambulatory Visit: Payer: Self-pay | Admitting: Internal Medicine

## 2019-01-16 ENCOUNTER — Other Ambulatory Visit: Payer: Self-pay | Admitting: Gynecology

## 2019-01-16 DIAGNOSIS — Z1231 Encounter for screening mammogram for malignant neoplasm of breast: Secondary | ICD-10-CM

## 2019-01-18 ENCOUNTER — Other Ambulatory Visit: Payer: Self-pay | Admitting: Internal Medicine

## 2019-01-22 ENCOUNTER — Ambulatory Visit: Payer: Medicare Other | Admitting: Internal Medicine

## 2019-01-23 ENCOUNTER — Other Ambulatory Visit: Payer: Self-pay | Admitting: Internal Medicine

## 2019-01-25 ENCOUNTER — Observation Stay (HOSPITAL_COMMUNITY): Payer: Medicare Other

## 2019-01-25 ENCOUNTER — Other Ambulatory Visit: Payer: Self-pay

## 2019-01-25 ENCOUNTER — Inpatient Hospital Stay (HOSPITAL_COMMUNITY)
Admission: EM | Admit: 2019-01-25 | Discharge: 2019-01-30 | DRG: 871 | Disposition: A | Discharge status: Home Health | Payer: Medicare Other | Attending: Internal Medicine | Admitting: Internal Medicine

## 2019-01-25 ENCOUNTER — Emergency Department (HOSPITAL_COMMUNITY): Payer: Medicare Other

## 2019-01-25 ENCOUNTER — Encounter (HOSPITAL_COMMUNITY): Payer: Self-pay

## 2019-01-25 DIAGNOSIS — Z8249 Family history of ischemic heart disease and other diseases of the circulatory system: Secondary | ICD-10-CM

## 2019-01-25 DIAGNOSIS — Z03818 Encounter for observation for suspected exposure to other biological agents ruled out: Secondary | ICD-10-CM | POA: Diagnosis not present

## 2019-01-25 DIAGNOSIS — N390 Urinary tract infection, site not specified: Secondary | ICD-10-CM | POA: Diagnosis present

## 2019-01-25 DIAGNOSIS — E1165 Type 2 diabetes mellitus with hyperglycemia: Secondary | ICD-10-CM

## 2019-01-25 DIAGNOSIS — I6789 Other cerebrovascular disease: Secondary | ICD-10-CM | POA: Diagnosis not present

## 2019-01-25 DIAGNOSIS — N200 Calculus of kidney: Secondary | ICD-10-CM | POA: Diagnosis not present

## 2019-01-25 DIAGNOSIS — Z801 Family history of malignant neoplasm of trachea, bronchus and lung: Secondary | ICD-10-CM

## 2019-01-25 DIAGNOSIS — Z7951 Long term (current) use of inhaled steroids: Secondary | ICD-10-CM

## 2019-01-25 DIAGNOSIS — G9341 Metabolic encephalopathy: Secondary | ICD-10-CM | POA: Diagnosis not present

## 2019-01-25 DIAGNOSIS — B3741 Candidal cystitis and urethritis: Secondary | ICD-10-CM

## 2019-01-25 DIAGNOSIS — K224 Dyskinesia of esophagus: Secondary | ICD-10-CM | POA: Diagnosis present

## 2019-01-25 DIAGNOSIS — K219 Gastro-esophageal reflux disease without esophagitis: Secondary | ICD-10-CM | POA: Diagnosis present

## 2019-01-25 DIAGNOSIS — Z880 Allergy status to penicillin: Secondary | ICD-10-CM

## 2019-01-25 DIAGNOSIS — E785 Hyperlipidemia, unspecified: Secondary | ICD-10-CM | POA: Diagnosis present

## 2019-01-25 DIAGNOSIS — Z833 Family history of diabetes mellitus: Secondary | ICD-10-CM

## 2019-01-25 DIAGNOSIS — Z90710 Acquired absence of both cervix and uterus: Secondary | ICD-10-CM

## 2019-01-25 DIAGNOSIS — J45909 Unspecified asthma, uncomplicated: Secondary | ICD-10-CM | POA: Diagnosis present

## 2019-01-25 DIAGNOSIS — J181 Lobar pneumonia, unspecified organism: Secondary | ICD-10-CM | POA: Diagnosis not present

## 2019-01-25 DIAGNOSIS — E213 Hyperparathyroidism, unspecified: Secondary | ICD-10-CM | POA: Diagnosis present

## 2019-01-25 DIAGNOSIS — A419 Sepsis, unspecified organism: Secondary | ICD-10-CM | POA: Diagnosis not present

## 2019-01-25 DIAGNOSIS — G9340 Encephalopathy, unspecified: Secondary | ICD-10-CM

## 2019-01-25 DIAGNOSIS — I5032 Chronic diastolic (congestive) heart failure: Secondary | ICD-10-CM | POA: Diagnosis present

## 2019-01-25 DIAGNOSIS — M797 Fibromyalgia: Secondary | ICD-10-CM | POA: Diagnosis present

## 2019-01-25 DIAGNOSIS — Z79899 Other long term (current) drug therapy: Secondary | ICD-10-CM

## 2019-01-25 DIAGNOSIS — Z9221 Personal history of antineoplastic chemotherapy: Secondary | ICD-10-CM

## 2019-01-25 DIAGNOSIS — Z66 Do not resuscitate: Secondary | ICD-10-CM | POA: Diagnosis not present

## 2019-01-25 DIAGNOSIS — B961 Klebsiella pneumoniae [K. pneumoniae] as the cause of diseases classified elsewhere: Secondary | ICD-10-CM | POA: Diagnosis present

## 2019-01-25 DIAGNOSIS — M542 Cervicalgia: Secondary | ICD-10-CM | POA: Diagnosis present

## 2019-01-25 DIAGNOSIS — Z9181 History of falling: Secondary | ICD-10-CM

## 2019-01-25 DIAGNOSIS — J9621 Acute and chronic respiratory failure with hypoxia: Secondary | ICD-10-CM | POA: Diagnosis present

## 2019-01-25 DIAGNOSIS — R6520 Severe sepsis without septic shock: Secondary | ICD-10-CM | POA: Diagnosis present

## 2019-01-25 DIAGNOSIS — I10 Essential (primary) hypertension: Secondary | ICD-10-CM | POA: Diagnosis present

## 2019-01-25 DIAGNOSIS — R410 Disorientation, unspecified: Secondary | ICD-10-CM | POA: Diagnosis not present

## 2019-01-25 DIAGNOSIS — Z79891 Long term (current) use of opiate analgesic: Secondary | ICD-10-CM

## 2019-01-25 DIAGNOSIS — R52 Pain, unspecified: Secondary | ICD-10-CM | POA: Diagnosis not present

## 2019-01-25 DIAGNOSIS — D539 Nutritional anemia, unspecified: Secondary | ICD-10-CM | POA: Diagnosis present

## 2019-01-25 DIAGNOSIS — E1151 Type 2 diabetes mellitus with diabetic peripheral angiopathy without gangrene: Secondary | ICD-10-CM | POA: Diagnosis present

## 2019-01-25 DIAGNOSIS — Z794 Long term (current) use of insulin: Secondary | ICD-10-CM

## 2019-01-25 DIAGNOSIS — Z91040 Latex allergy status: Secondary | ICD-10-CM

## 2019-01-25 DIAGNOSIS — E119 Type 2 diabetes mellitus without complications: Secondary | ICD-10-CM | POA: Diagnosis present

## 2019-01-25 DIAGNOSIS — T380X5A Adverse effect of glucocorticoids and synthetic analogues, initial encounter: Secondary | ICD-10-CM | POA: Diagnosis not present

## 2019-01-25 DIAGNOSIS — Z20828 Contact with and (suspected) exposure to other viral communicable diseases: Secondary | ICD-10-CM | POA: Diagnosis not present

## 2019-01-25 DIAGNOSIS — E039 Hypothyroidism, unspecified: Secondary | ICD-10-CM | POA: Diagnosis present

## 2019-01-25 DIAGNOSIS — J441 Chronic obstructive pulmonary disease with (acute) exacerbation: Secondary | ICD-10-CM | POA: Diagnosis present

## 2019-01-25 DIAGNOSIS — I110 Hypertensive heart disease with heart failure: Secondary | ICD-10-CM | POA: Diagnosis present

## 2019-01-25 DIAGNOSIS — R296 Repeated falls: Secondary | ICD-10-CM | POA: Diagnosis present

## 2019-01-25 DIAGNOSIS — D509 Iron deficiency anemia, unspecified: Secondary | ICD-10-CM | POA: Diagnosis present

## 2019-01-25 DIAGNOSIS — Z79890 Hormone replacement therapy (postmenopausal): Secondary | ICD-10-CM

## 2019-01-25 DIAGNOSIS — J4530 Mild persistent asthma, uncomplicated: Secondary | ICD-10-CM

## 2019-01-25 DIAGNOSIS — Z8543 Personal history of malignant neoplasm of ovary: Secondary | ICD-10-CM

## 2019-01-25 DIAGNOSIS — M810 Age-related osteoporosis without current pathological fracture: Secondary | ICD-10-CM | POA: Diagnosis present

## 2019-01-25 DIAGNOSIS — R404 Transient alteration of awareness: Secondary | ICD-10-CM | POA: Diagnosis not present

## 2019-01-25 LAB — LACTIC ACID, PLASMA
Lactic Acid, Venous: 1.1 mmol/L (ref 0.5–1.9)
Lactic Acid, Venous: 1.2 mmol/L (ref 0.5–1.9)

## 2019-01-25 LAB — GLUCOSE, CAPILLARY
Glucose-Capillary: 232 mg/dL — ABNORMAL HIGH (ref 70–99)
Glucose-Capillary: 104 mg/dL — ABNORMAL HIGH (ref 70–99)

## 2019-01-25 LAB — BASIC METABOLIC PANEL
Anion gap: 10 (ref 5–15)
BUN: 11 mg/dL (ref 8–23)
CO2: 25 mmol/L (ref 22–32)
Calcium: 8.7 mg/dL — ABNORMAL LOW (ref 8.9–10.3)
Chloride: 100 mmol/L (ref 98–111)
Creatinine, Ser: 0.97 mg/dL (ref 0.44–1.00)
GFR calc Af Amer: >60 mL/min (ref >60)
GFR calc non Af Amer: 55 mL/min — ABNORMAL LOW (ref >60)
Glucose, Bld: 231 mg/dL — ABNORMAL HIGH (ref 70–99)
Potassium: 3.7 mmol/L (ref 3.5–5.1)
Sodium: 135 mmol/L (ref 135–145)

## 2019-01-25 LAB — CBC WITH DIFFERENTIAL/PLATELET
Abs Immature Granulocytes: 0.14 10*3/uL — ABNORMAL HIGH (ref 0.00–0.07)
Basophils Absolute: 0.1 10*3/uL (ref 0.0–0.1)
Basophils Relative: 0 %
Eosinophils Absolute: 0.2 10*3/uL (ref 0.0–0.5)
Eosinophils Relative: 1 %
HCT: 34.3 % — ABNORMAL LOW (ref 36.0–46.0)
Hemoglobin: 10.7 g/dL — ABNORMAL LOW (ref 12.0–15.0)
Immature Granulocytes: 1 %
Lymphocytes Relative: 19 %
Lymphs Abs: 3.3 10*3/uL (ref 0.7–4.0)
MCH: 32.1 pg (ref 26.0–34.0)
MCHC: 31.2 g/dL (ref 30.0–36.0)
MCV: 103 fL — ABNORMAL HIGH (ref 80.0–100.0)
Monocytes Absolute: 1.1 10*3/uL — ABNORMAL HIGH (ref 0.1–1.0)
Monocytes Relative: 6 %
Neutro Abs: 12.7 10*3/uL — ABNORMAL HIGH (ref 1.7–7.7)
Neutrophils Relative %: 73 %
Platelets: 324 10*3/uL (ref 150–400)
RBC: 3.33 MIL/uL — ABNORMAL LOW (ref 3.87–5.11)
RDW: 13.2 % (ref 11.5–15.5)
WBC: 17.4 10*3/uL — ABNORMAL HIGH (ref 4.0–10.5)
nRBC: 0 % (ref 0.0–0.2)

## 2019-01-25 LAB — URINALYSIS, ROUTINE W REFLEX MICROSCOPIC
Bilirubin Urine: NEGATIVE
Glucose, UA: NEGATIVE mg/dL
Ketones, ur: 20 mg/dL — AB
Nitrite: NEGATIVE
Protein, ur: 100 mg/dL — AB
Specific Gravity, Urine: 1.012 (ref 1.005–1.030)
WBC, UA: >50 WBC/hpf — ABNORMAL HIGH (ref 0–5)
pH: 5 (ref 5.0–8.0)

## 2019-01-25 LAB — SARS CORONAVIRUS 2 (TAT 6-24 HRS): SARS Coronavirus 2: NEGATIVE

## 2019-01-25 MED ORDER — ACETAMINOPHEN 325 MG PO TABS
650.0000 mg | ORAL_TABLET | Freq: Once | ORAL | Status: AC
  Administered 2019-01-25: 15:00:00 650 mg via ORAL
  Filled 2019-01-25: qty 2

## 2019-01-25 MED ORDER — ONDANSETRON HCL 4 MG PO TABS: 4.0000 mg | ORAL_TABLET | Freq: Four times a day (QID) | ORAL | Status: DC | PRN

## 2019-01-25 MED ORDER — CIPROFLOXACIN IN D5W 400 MG/200ML IV SOLN
400.0000 mg | Freq: Two times a day (BID) | INTRAVENOUS | Status: DC
  Administered 2019-01-26: 06:00:00 400 mg via INTRAVENOUS
  Filled 2019-01-25: qty 200

## 2019-01-25 MED ORDER — MOMETASONE FURO-FORMOTEROL FUM 200-5 MCG/ACT IN AERO
2.0000 | INHALATION_SPRAY | Freq: Two times a day (BID) | RESPIRATORY_TRACT | Status: DC
  Administered 2019-01-25 – 2019-01-30 (×10): 2 via RESPIRATORY_TRACT
  Filled 2019-01-25: qty 8.8

## 2019-01-25 MED ORDER — LEVOTHYROXINE SODIUM 50 MCG PO TABS
50.0000 ug | ORAL_TABLET | Freq: Every day | ORAL | Status: DC
  Administered 2019-01-27 – 2019-01-30 (×4): 50 ug via ORAL
  Filled 2019-01-25 (×4): qty 1

## 2019-01-25 MED ORDER — TIOTROPIUM BROMIDE MONOHYDRATE 18 MCG IN CAPS: 18.0000 ug | ORAL_CAPSULE | Freq: Every day | RESPIRATORY_TRACT | Status: DC

## 2019-01-25 MED ORDER — METHYLPREDNISOLONE SODIUM SUCC 40 MG IJ SOLR
40.0000 mg | INTRAMUSCULAR | Status: DC
  Administered 2019-01-25 – 2019-01-26 (×2): 40 mg via INTRAVENOUS
  Filled 2019-01-25 (×2): qty 1

## 2019-01-25 MED ORDER — INSULIN GLARGINE 100 UNIT/ML ~~LOC~~ SOLN: 8.0000 [IU] | Freq: Every day | SUBCUTANEOUS | Status: DC

## 2019-01-25 MED ORDER — INSULIN ASPART 100 UNIT/ML ~~LOC~~ SOLN
0.0000 [IU] | Freq: Three times a day (TID) | SUBCUTANEOUS | Status: DC
  Administered 2019-01-25: 3 [IU] via SUBCUTANEOUS
  Administered 2019-01-26: 5 [IU] via SUBCUTANEOUS
  Administered 2019-01-26: 09:00:00 2 [IU] via SUBCUTANEOUS
  Administered 2019-01-27: 5 [IU] via SUBCUTANEOUS

## 2019-01-25 MED ORDER — ACETAMINOPHEN 325 MG PO TABS: 650.0000 mg | ORAL_TABLET | Freq: Once | ORAL | Status: DC

## 2019-01-25 MED ORDER — ACETAMINOPHEN 650 MG RE SUPP: 650.0000 mg | Freq: Four times a day (QID) | RECTAL | Status: DC | PRN

## 2019-01-25 MED ORDER — ALBUTEROL SULFATE HFA 108 (90 BASE) MCG/ACT IN AERS: 1.0000 | INHALATION_SPRAY | Freq: Four times a day (QID) | RESPIRATORY_TRACT | Status: DC

## 2019-01-25 MED ORDER — ONDANSETRON HCL 4 MG/2ML IJ SOLN: 4.0000 mg | Freq: Four times a day (QID) | INTRAMUSCULAR | Status: DC | PRN

## 2019-01-25 MED ORDER — CIPROFLOXACIN IN D5W 400 MG/200ML IV SOLN
400.0000 mg | Freq: Once | INTRAVENOUS | Status: AC
  Administered 2019-01-25: 16:00:00 400 mg via INTRAVENOUS
  Filled 2019-01-25: qty 200

## 2019-01-25 MED ORDER — TRAMADOL HCL 50 MG PO TABS
50.0000 mg | ORAL_TABLET | Freq: Four times a day (QID) | ORAL | Status: DC | PRN
  Administered 2019-01-26 – 2019-01-30 (×6): 50 mg via ORAL
  Filled 2019-01-25 (×6): qty 1

## 2019-01-25 MED ORDER — SODIUM CHLORIDE 0.9 % IV BOLUS
500.0000 mL | Freq: Once | INTRAVENOUS | Status: AC
  Administered 2019-01-25: 500 mL via INTRAVENOUS

## 2019-01-25 MED ORDER — MONTELUKAST SODIUM 10 MG PO TABS
10.0000 mg | ORAL_TABLET | Freq: Every day | ORAL | Status: DC
  Administered 2019-01-27 – 2019-01-29 (×4): 10 mg via ORAL
  Filled 2019-01-25 (×4): qty 1

## 2019-01-25 MED ORDER — PANTOPRAZOLE SODIUM 40 MG IV SOLR
40.0000 mg | Freq: Two times a day (BID) | INTRAVENOUS | Status: DC
  Administered 2019-01-25 – 2019-01-27 (×4): 40 mg via INTRAVENOUS
  Filled 2019-01-25 (×4): qty 40

## 2019-01-25 MED ORDER — SODIUM CHLORIDE 0.9 % IV SOLN
INTRAVENOUS | Status: DC
  Administered 2019-01-25 – 2019-01-26 (×2): via INTRAVENOUS

## 2019-01-25 MED ORDER — ACETAMINOPHEN 325 MG PO TABS
650.0000 mg | ORAL_TABLET | Freq: Four times a day (QID) | ORAL | Status: DC | PRN
  Administered 2019-01-27 (×2): 650 mg via ORAL
  Filled 2019-01-25 (×2): qty 2

## 2019-01-25 MED ORDER — INSULIN GLARGINE 100 UNIT/ML ~~LOC~~ SOLN
5.0000 [IU] | SUBCUTANEOUS | Status: DC
  Administered 2019-01-25 – 2019-01-27 (×3): 5 [IU] via SUBCUTANEOUS
  Filled 2019-01-25 (×4): qty 0.05

## 2019-01-25 MED ORDER — UMECLIDINIUM BROMIDE 62.5 MCG/INH IN AEPB
1.0000 | INHALATION_SPRAY | Freq: Every day | RESPIRATORY_TRACT | Status: DC
  Administered 2019-01-25 – 2019-01-30 (×5): 1 via RESPIRATORY_TRACT
  Filled 2019-01-25: qty 7

## 2019-01-25 MED ORDER — ALBUTEROL SULFATE (2.5 MG/3ML) 0.083% IN NEBU
2.5000 mg | INHALATION_SOLUTION | Freq: Four times a day (QID) | RESPIRATORY_TRACT | Status: DC
  Administered 2019-01-25: 21:00:00 2.5 mg via RESPIRATORY_TRACT
  Filled 2019-01-25: qty 3

## 2019-01-25 MED ORDER — ENOXAPARIN SODIUM 40 MG/0.4ML ~~LOC~~ SOLN
40.0000 mg | SUBCUTANEOUS | Status: DC
  Administered 2019-01-25 – 2019-01-29 (×5): 40 mg via SUBCUTANEOUS
  Filled 2019-01-25 (×5): qty 0.4

## 2019-01-25 NOTE — ED Notes (Signed)
Transporter notified of need for pt transport to floor.

## 2019-01-25 NOTE — ED Provider Notes (Signed)
[facility] DEPT Provider Note   CSN: [id number] Arrival date & time: 01/25/19  1110     History   Chief Complaint Chief Complaint  Patient presents with  . Urinary Tract Infection  . Dysuria    HPI Kayla Thompson is a 83 y.o. female.     HPI   83yF with confusion. Daughter at bedside providing additional history. Pt fell/rolled out of bed this morning. Family found her laying across the side of it this morning. Has been incontinent of urine and going more frequently. Onset ~2d ago. Confusion for as many days. Occasional cough but this is chronic. HX of COPD.   Past Medical History:  Diagnosis Date  . Arthritis   . Asthma   . Diabetes mellitus without complication ([place])   . Fibromyalgia   . GERD (gastroesophageal reflux disease)   . History of chemotherapy   . History of fractured pelvis   . Hypertension   . Osteoporosis 12/2017   T score -2.8 stable from prior DEXA  . Osteoporosis   . Ovarian cancer [facility])     Patient Active Problem List   Diagnosis Date Noted  . Encounter for general adult medical examination with abnormal findings 01/09/2019  . Abnormal barium swallow 12/28/2018  . Throat clearing 12/28/2018  . History of colonic polyps 12/28/2018  . Dysphagia 12/28/2018  . Diabetic foot ulcer ([place]) 12/05/2018  . Rotator cuff arthropathy, left 11/16/2018  . Degenerative arthritis of left knee 11/16/2018  . Insomnia 09/26/2018  . Leukocytosis 10/17/2017  . Malnutrition of moderate degree 10/10/2017  . Memory changes 07/29/2017  . Granulomatous lung disease ([place]) 06/14/2017  . Tracheobronchomalacia 06/14/2017  . Dizziness 03/04/2017  . Exercise hypoxemia 07/12/2016  . Diastolic CHF, chronic ([place]) 12/25/2015  . GERD (gastroesophageal reflux disease) 12/25/2015  . Epidermal inclusion cyst 09/02/2015  . Type 2 diabetes mellitus with hyperglycemia, with long-term current use of insulin ([place]) 03/10/2015  . Muscle cramps  03/06/2015  . Bronchiectasis without acute exacerbation ([place]) 01/07/2015  . PMR (polymyalgia rheumatica) ([place]) 01/07/2015  . Osteoarthritis 01/07/2015  . Asthma, chronic 11/12/2014  . Anxiety state 11/12/2014  . Hyperparathyroidism ([place]) 10/28/2014  . Fibromyalgia 09/21/2014  . Personal history of ovarian cancer 08/23/2014  . Osteoporosis 08/23/2014  . Hypertension 08/02/2014  . HLD (hyperlipidemia) 08/02/2014  . Hypothyroidism 08/02/2014    Past Surgical History:  Procedure Laterality Date  . ABDOMINAL HYSTERECTOMY  1996  . ANKLE FRACTURE SURGERY     plate and screws  . BLADDER SUSPENSION    . CATARACT EXTRACTION    . EYE SURGERY    . hip surgey    . knee surgey    . LUNG SURGERY    . OOPHORECTOMY     BSO     OB History    Gravida  1   Para  1   Term      Preterm      AB      Living  1     SAB      TAB      Ectopic      Multiple      Live Births               Home Medications    Prior to Admission medications   Medication Sig Start Date End Date Taking? Authorizing Provider  albuterol (PROVENTIL HFA;VENTOLIN HFA) 108 (90 Base) MCG/ACT inhaler Inhale 2 puffs into the lungs every 6 (six) hours as needed for  wheezing or shortness of breath. 09/19/17   Noralee Space, MD  amitriptyline (ELAVIL) 10 MG tablet Take 1 tablet (10 mg total) by mouth at bedtime. 09/26/18   [name], MD  baclofen (LIORESAL) 10 MG tablet TAKE 0.5-1 TABLET BY MOUTH TWICE DAILY AS NEEDED FOR MUSCLE SPASMS OR PAIN 09/21/18   [name], MD  BD PEN NEEDLE NANO U/F 32G X 4 MM MISC USE 4 TIMES A DAY 08/07/18   [name], MD  busPIRone (BUSPAR) 5 MG tablet TAKE 1 TO 2 TABLETS BY MOUTH TWICE A DAY 05/11/18   [name], MD  calcium carbonate (OSCAL) 1500 (600 Ca) MG TABS tablet Take 1,500 mg by mouth daily.    [provider]  carvedilol (COREG) 6.25 MG tablet TAKE 1/2 TABLET BY MOUTH 2 (TWO) TIMES DAILY WITH A MEAL. 06/05/18   [name], MD  Cholecalciferol (VITAMIN D3) 2000 units TABS Take 2 tablets by mouth daily.     [provider]  denosumab (PROLIA) 60 MG/ML SOLN injection Inject 60 mg into the skin every 6 (six) months. Administer in upper arm, thigh, or abdomen    [provider]  Dextromethorphan-Guaifenesin (MUCINEX DM MAXIMUM STRENGTH) 60-1200 MG TB12 Take 1 tablet by mouth 2 (two) times daily. Reported on 06/23/2015    [provider]  donepezil (ARICEPT) 5 MG tablet TAKE 1 TABLET BY MOUTH EVERYDAY AT BEDTIME 05/19/18   [provider]  DULoxetine (CYMBALTA) 30 MG capsule TAKE 1 CAPSULE BY MOUTH EVERY DAY 12/05/18   [name], MD  fluticasone [facility]) 50 MCG/ACT nasal spray Place 2 sprays into both nostrils daily. 12/06/17   [name], MD  Fluticasone-Salmeterol (ADVAIR DISKUS) 500-50 MCG/DOSE AEPB Inhale 1 puff into the lungs 2 (two) times a day. Rinse mouth 10/04/18   Young, Tarri Fuller D, MD  glucose blood (FREESTYLE TEST STRIPS) test strip Use to test blood sugar 3 times daily. Dx: E11.65 10/14/16   Philemon Kingdom, MD  Insulin Glargine (LANTUS SOLOSTAR) 100 UNIT/ML Solostar Pen Inject 20 Units into the skin every morning. Patient taking differently: Inject 15 Units into the skin every morning.  06/09/18   Philemon Kingdom, MD  Lancets (FREESTYLE) lancets Use to test blood sugar 3 times daily. Dx: E11.65 10/14/16   Philemon Kingdom, MD  levothyroxine (SYNTHROID) 50 MCG tablet TAKE 1 TABLET BY MOUTH EVERY DAY BEFORE BREAKFAST 11/06/18   [name], MD  magnesium oxide (MAG-OX) 400 MG tablet TAKE 1 TABLET BY MOUTH EVERY DAY 07/25/18   [name], MD  montelukast (SINGULAIR) 10 MG tablet TAKE 1 TABLET BY MOUTH EVERY DAY 11/13/18   Baird Lyons D, MD  Multiple Vitamins-Minerals (CENTRUM ADULTS PO) Take by mouth.    [provider]  omeprazole (PRILOSEC) 40 MG capsule Take 1 capsule (40 mg total) by mouth 2 (two) times daily.  12/26/18   Mansouraty, Telford Nab., MD  Polyethyl Glycol-Propyl Glycol (SYSTANE FREE OP) Apply 1 drop to eye 3 (three) times daily.    [provider]  predniSONE (DELTASONE) 1 MG tablet Take 2 tablets (2 mg total) by mouth daily. 07/25/18   [name], MD  rosuvastatin (CRESTOR) 10 MG tablet TAKE 1 TABLET (10 MG TOTAL) BY MOUTH EVERY EVENING. 06/05/18   [name], MD  tiotropium (SPIRIVA HANDIHALER) 18 MCG inhalation capsule PLACE 1 CAPSULE INTO INHALER AND INHALE AS DIRECTED EVERY DAY 10/04/18   Deneise Lever, MD  traMADol [facility])  50 MG tablet  08/09/18   [provider]  UNABLE TO FIND Take 1,000 mg by mouth daily. Med Name: Tumeric    [provider]  UNABLE TO FIND Take 1,000 mg by mouth daily. Med Name: [facility]    [provider]  vitamin B-12 (CYANOCOBALAMIN) 250 MCG tablet Take 250 mcg by mouth daily.    [provider]  ipratropium-albuterol (DUONEB) 0.5-2.5 (3) MG/3ML SOLN USE 1 VIAL IN NEBULIZER 3 TIMES DAILY 09/04/18 09/04/18  Garner Nash, DO    Family History Family History  Problem Relation Age of Onset  . Lung cancer Mother   . CAD Father   . Diabetes Father   . Lung cancer Maternal Aunt   . Diabetes Paternal Aunt   . Diabetes Paternal Uncle   . Diabetes Paternal Grandmother   . Diabetes Paternal Grandfather   . Colon cancer Neg Hx   . Esophageal cancer Neg Hx   . Inflammatory bowel disease Neg Hx   . Liver disease Neg Hx   . Pancreatic cancer Neg Hx   . Rectal cancer Neg Hx   . Stomach cancer Neg Hx     Social History Social History   Tobacco Use  . Smoking status: Never Smoker  . Smokeless tobacco: Never Used  Substance Use Topics  . Alcohol use: No    Alcohol/week: 0.0 standard drinks  . Drug use: No     Allergies   Latex and Penicillins   Review of Systems Review of Systems   Physical Exam Updated Vital Signs BP (!) 153/99   Pulse 97   Temp (!) 102.1 F (38.9 C) (Oral)    Resp (!) 24   SpO2 96%   Physical Exam Vitals signs and nursing note reviewed.  Constitutional:      General: She is not in acute distress.    Appearance: She is well-developed.  HENT:     Head: Normocephalic and atraumatic.  Eyes:     General:        Right eye: No discharge.        Left eye: No discharge.     Conjunctiva/sclera: Conjunctivae normal.  Neck:     Musculoskeletal: Neck supple.  Cardiovascular:     Rate and Rhythm: Normal rate and regular rhythm.     Heart sounds: Normal heart sounds. No murmur. No friction rub. No gallop.   Pulmonary:     Effort: Pulmonary effort is normal. No respiratory distress.     Breath sounds: Wheezing present.  Abdominal:     General: There is no distension.     Palpations: Abdomen is soft.     Tenderness: There is abdominal tenderness.     Comments: Mild suprapubic tenderness w/o rebound or guarding  Musculoskeletal:        General: No tenderness.  Skin:    General: Skin is warm and dry.  Neurological:     Mental Status: She is alert.      ED Treatments / Results  Labs (all labs ordered are listed, but only abnormal results are displayed) Labs Reviewed  URINALYSIS, ROUTINE W REFLEX MICROSCOPIC - Abnormal; Notable for the following components:      Result Value   APPearance CLOUDY (*)    Hgb urine dipstick MODERATE (*)    Ketones, ur 20 (*)    Protein, ur 100 (*)    Leukocytes,Ua LARGE (*)    WBC, UA >50 (*)    Bacteria, UA RARE (*)  All other components within normal limits  CBC WITH DIFFERENTIAL/PLATELET - Abnormal; Notable for the following components:   WBC 17.4 (*)    RBC 3.33 (*)    Hemoglobin 10.7 (*)    HCT 34.3 (*)    MCV 103.0 (*)    Neutro Abs 12.7 (*)    Monocytes Absolute 1.1 (*)    Abs Immature Granulocytes 0.14 (*)    All other components within normal limits  BASIC METABOLIC PANEL - Abnormal; Notable for the following components:   Glucose, Bld 231 (*)    Calcium 8.7 (*)    GFR calc non Af  Amer 55 (*)    All other components within normal limits  URINE CULTURE  SARS CORONAVIRUS 2 (TAT 6-24 HRS)  CULTURE, BLOOD (ROUTINE X 2)  CULTURE, BLOOD (ROUTINE X 2)    EKG None  Radiology Dg Chest Portable 1 View  Result Date: 01/25/2019 CLINICAL DATA:  83 y.o. Female. Per EMS- pt c/o dysuria, foul smelling urine x 4-5 days. Family reports AMS starting at same time. Pt hx of COPD- 2 breathing tx daily, none taken today. Wears O2 PRN at home. EXAM: PORTABLE CHEST 1 VIEW COMPARISON:  11/07/2017 FINDINGS: Heart size is normal. The lungs are free of focal consolidations and pleural effusions. Minimal RIGHT LOWER lobe atelectasis or scarring. Remote rib fractures. Chronic changes in both shoulders. IMPRESSION: No active disease. Electronically Signed   By: Nolon Nations M.D.   On: 01/25/2019 13:18    Procedures Procedures (including critical care time)  Medications Ordered in ED Medications  acetaminophen (TYLENOL) tablet 650 mg (has no administration in time range)  acetaminophen (TYLENOL) tablet 650 mg (650 mg Oral Given 01/25/19 1511)     Initial Impression / Assessment and Plan / ED Course  I have reviewed the triage vital signs and the nursing notes.  Pertinent labs & imaging results that were available during my care of the patient were reviewed by me and considered in my medical decision making (see chart for details).        83yF with fever, confusion. Likely from UTI. UA is consistent. Abx. Admit.   Final Clinical Impressions(s) / ED Diagnoses   Final diagnoses:  Lower urinary tract infectious disease  Encephalopathy    ED Discharge Orders    None       Virgel Manifold, MD 01/25/19 1526

## 2019-01-25 NOTE — ED Notes (Signed)
Linens changed and and pure-wick placed.

## 2019-01-25 NOTE — ED Notes (Signed)
Patient transported to CT 

## 2019-01-25 NOTE — ED Triage Notes (Signed)
Pt BIBA from home.   Per EMS- Pt c/o dysuria, foul smelling urine x 4-5 days.  Family reports AMS starting at same time.  Pt hx of COPD- 2 breathing tx daily, none taken today. Wears O2 PRN at home. Pt ambulatory with assistance. AOx3.

## 2019-01-25 NOTE — H&P (Addendum)
History and Physical  Kayla Thompson [id number] DOB: 04/02/1935 DOA: 01/25/2019  PCP: [name], MD Patient coming from: Home   I have personally briefly reviewed patient's old medical records in [place]   Chief Complaint: Confusion, Weakness  HPI: Kayla Thompson is a 83 y.o. female medical history significant for diabetes, fibromyalgia, osteoporosis, hypertension, asthma, chronic diastolic heart failure who presents with generalized weakness, confusion and fever found to have UTI.  Patient seen and examined at, daughter is at bedside and help with history.  Patient has been very weak generalized weakness, confused, unable to stand up.  Patient fell the morning of admission and was not able to stand up.  Patient has been having worsening cough and shortness of breath and wheezing for the last 2 days.  Daughter reports a history of dysphagia, patient choke on liquids and fluid.  She is supposed to have an endoscopy, by [facility], GI on November 16.  Had an esophagogram that showed nonspecific esophageal dysmotility disorder with considerable tertiary contractions distally to the esophagus.  Mild distal esophageal fold thickening may reflect esophagitis.  Chest x-ray no active disease.   Evaluation in the ED: Patient was found to be febrile temperature 102, tachycardia 113, respiration rate 24, leukocytosis 17, hemoglobin 10, UA white blood cell more than 50, moderate hemoglobin.   Review of Systems: All systems reviewed and apart from history of presenting illness, are negative.  Past Medical History:  Diagnosis Date  . Arthritis   . Asthma   . Diabetes mellitus without complication ([place])   . Fibromyalgia   . GERD (gastroesophageal reflux disease)   . History of chemotherapy   . History of fractured pelvis   . Hypertension   . Osteoporosis 12/2017   T score -2.8 stable from prior DEXA  . Osteoporosis   . Ovarian cancer [facility])    Past Surgical History:   Procedure Laterality Date  . ABDOMINAL HYSTERECTOMY  1996  . ANKLE FRACTURE SURGERY     plate and screws  . BLADDER SUSPENSION    . CATARACT EXTRACTION    . EYE SURGERY    . hip surgey    . knee surgey    . LUNG SURGERY    . OOPHORECTOMY     BSO   Social History:  reports that she has never smoked. She has never used smokeless tobacco. She reports that she does not drink alcohol or use drugs.   Allergies  Allergen Reactions  . Latex Rash  . Penicillins Rash    Has patient had a PCN reaction causing immediate rash, facial/tongue/throat swelling, SOB or lightheadedness with hypotension: No Has patient had a PCN reaction causing severe rash involving mucus membranes or skin necrosis: No Has patient had a PCN reaction that required hospitalization: No Has patient had a PCN reaction occurring within the last 10 years: No If all of the above answers are "NO", then may proceed with Cephalosporin use.     Family History  Problem Relation Age of Onset  . Lung cancer Mother   . CAD Father   . Diabetes Father   . Lung cancer Maternal Aunt   . Diabetes Paternal Aunt   . Diabetes Paternal Uncle   . Diabetes Paternal Grandmother   . Diabetes Paternal Grandfather   . Colon cancer Neg Hx   . Esophageal cancer Neg Hx   . Inflammatory bowel disease Neg Hx   . Liver disease Neg Hx   . Pancreatic  cancer Neg Hx   . Rectal cancer Neg Hx   . Stomach cancer Neg Hx     Prior to Admission medications   Medication Sig Start Date End Date Taking? Authorizing Provider  albuterol (PROVENTIL HFA;VENTOLIN HFA) 108 (90 Base) MCG/ACT inhaler Inhale 2 puffs into the lungs every 6 (six) hours as needed for wheezing or shortness of breath. 09/19/17   Noralee Space, MD  amitriptyline (ELAVIL) 10 MG tablet Take 1 tablet (10 mg total) by mouth at bedtime. 09/26/18   [name], MD  baclofen (LIORESAL) 10 MG tablet TAKE 0.5-1 TABLET BY MOUTH TWICE DAILY AS NEEDED FOR MUSCLE SPASMS OR PAIN 09/21/18    [name], MD  BD PEN NEEDLE NANO U/F 32G X 4 MM MISC USE 4 TIMES A DAY 08/07/18   [name], MD  busPIRone (BUSPAR) 5 MG tablet TAKE 1 TO 2 TABLETS BY MOUTH TWICE A DAY 05/11/18   [name], MD  calcium carbonate (OSCAL) 1500 (600 Ca) MG TABS tablet Take 1,500 mg by mouth daily.    [provider]  carvedilol (COREG) 6.25 MG tablet TAKE 1/2 TABLET BY MOUTH 2 (TWO) TIMES DAILY WITH A MEAL. 06/05/18   [name], MD  Cholecalciferol (VITAMIN D3) 2000 units TABS Take 2 tablets by mouth daily.     [provider]  denosumab (PROLIA) 60 MG/ML SOLN injection Inject 60 mg into the skin every 6 (six) months. Administer in upper arm, thigh, or abdomen    [provider]  Dextromethorphan-Guaifenesin (MUCINEX DM MAXIMUM STRENGTH) 60-1200 MG TB12 Take 1 tablet by mouth 2 (two) times daily. Reported on 06/23/2015    [provider]  donepezil (ARICEPT) 5 MG tablet TAKE 1 TABLET BY MOUTH EVERYDAY AT BEDTIME 05/19/18   [provider]  DULoxetine (CYMBALTA) 30 MG capsule TAKE 1 CAPSULE BY MOUTH EVERY DAY 12/05/18   [name], MD  fluticasone [facility]) 50 MCG/ACT nasal spray Place 2 sprays into both nostrils daily. 12/06/17   [name], MD  Fluticasone-Salmeterol (ADVAIR DISKUS) 500-50 MCG/DOSE AEPB Inhale 1 puff into the lungs 2 (two) times a day. Rinse mouth 10/04/18   Young, Tarri Fuller D, MD  glucose blood (FREESTYLE TEST STRIPS) test strip Use to test blood sugar 3 times daily. Dx: E11.65 10/14/16   Philemon Kingdom, MD  Insulin Glargine (LANTUS SOLOSTAR) 100 UNIT/ML Solostar Pen Inject 20 Units into the skin every morning. Patient taking differently: Inject 15 Units into the skin every morning.  06/09/18   Philemon Kingdom, MD  Lancets (FREESTYLE) lancets Use to test blood sugar 3 times daily. Dx: E11.65 10/14/16   Philemon Kingdom, MD  levothyroxine (SYNTHROID) 50 MCG tablet TAKE 1 TABLET BY MOUTH EVERY  DAY BEFORE BREAKFAST 11/06/18   [name], MD  magnesium oxide (MAG-OX) 400 MG tablet TAKE 1 TABLET BY MOUTH EVERY DAY 07/25/18   [name], MD  montelukast (SINGULAIR) 10 MG tablet TAKE 1 TABLET BY MOUTH EVERY DAY 11/13/18   Baird Lyons D, MD  Multiple Vitamins-Minerals (CENTRUM ADULTS PO) Take by mouth.    [provider]  omeprazole (PRILOSEC) 40 MG capsule Take 1 capsule (40 mg total) by mouth 2 (two) times daily. 12/26/18   Mansouraty, Telford Nab., MD  Polyethyl Glycol-Propyl Glycol (SYSTANE FREE OP) Apply 1 drop to eye 3 (three) times daily.    [provider]  predniSONE (DELTASONE) 1 MG tablet Take 2 tablets (2 mg total) by mouth daily.  07/25/18   [name], MD  rosuvastatin (CRESTOR) 10 MG tablet TAKE 1 TABLET (10 MG TOTAL) BY MOUTH EVERY EVENING. 06/05/18   [name], MD  tiotropium (SPIRIVA HANDIHALER) 18 MCG inhalation capsule PLACE 1 CAPSULE INTO INHALER AND INHALE AS DIRECTED EVERY DAY 10/04/18   Deneise Lever, MD  traMADol Veatrice [facility]bon) 50 MG tablet  08/09/18   [provider]  UNABLE TO FIND Take 1,000 mg by mouth daily. Med Name: Tumeric    [provider]  UNABLE TO FIND Take 1,000 mg by mouth daily. Med Name: [facility]    [provider]  vitamin B-12 (CYANOCOBALAMIN) 250 MCG tablet Take 250 mcg by mouth daily.    [provider]  ipratropium-albuterol (DUONEB) 0.5-2.5 (3) MG/3ML SOLN USE 1 VIAL IN NEBULIZER 3 TIMES DAILY 09/04/18 09/04/18  Garner Nash, DO   Physical Exam: Vitals:   01/25/19 1335 01/25/19 1400 01/25/19 1459 01/25/19 1500  BP: (!) 160/85 (!) 153/99  137/64  Pulse:  97    Resp: 20 (!) 24    Temp:   (!) 102.1 F (38.9 C)   TempSrc:   Oral   SpO2: 96% 96%       General exam: Moderately built and nourished patient, lying comfortably supine, sleepy confuse  Head, eyes and ENT: Nontraumatic and normocephalic. Pupils equally reacting to light and accommodation.  Oral mucosa moist.  Neck: Supple. No JVD, carotid bruit or thyromegaly.  Lymphatics: No lymphadenopathy.  Respiratory system: Tachypnea, bilateral sporadic wheezing  Cardiovascular system: S1 and S2 heard, RRR. No JVD, murmurs, gallops, clicks or pedal edema.  Gastrointestinal system: Abdomen is nondistended, soft and nontender. Normal bowel sounds heard. No organomegaly or masses appreciated.  Central nervous system: Sleepy, follows some commands, confused.  Extremities: Symmetric 5 x 5 power. Peripheral pulses symmetrically felt.   Skin: No rashes or acute findings.  Musculoskeletal system: Negative exam.  Psychiatry: Unable to evaluate due to altered mental status.   Labs on Admission:  Basic Metabolic Panel: Recent Labs  Lab 01/25/19 1249  NA 135  K 3.7  CL 100  CO2 25  GLUCOSE 231*  BUN 11  CREATININE 0.97  CALCIUM 8.7*   Liver Function Tests: No results for input(s): AST, ALT, ALKPHOS, BILITOT, PROT, ALBUMIN in the last 168 hours. No results for input(s): LIPASE, AMYLASE in the last 168 hours. No results for input(s): AMMONIA in the last 168 hours. CBC: Recent Labs  Lab 01/25/19 1249  WBC 17.4*  NEUTROABS 12.7*  HGB 10.7*  HCT 34.3*  MCV 103.0*  PLT 324   Cardiac Enzymes: No results for input(s): CKTOTAL, CKMB, CKMBINDEX, TROPONINI in the last 168 hours.  BNP (last 3 results) No results for input(s): PROBNP in the last 8760 hours. CBG: No results for input(s): GLUCAP in the last 168 hours.  Radiological Exams on Admission: Dg Chest Portable 1 View  Result Date: 01/25/2019 CLINICAL DATA:  83 y.o. Female. Per EMS- pt c/o dysuria, foul smelling urine x 4-5 days. Family reports AMS starting at same time. Pt hx of COPD- 2 breathing tx daily, none taken today. Wears O2 PRN at home. EXAM: PORTABLE CHEST 1 VIEW COMPARISON:  11/07/2017 FINDINGS: Heart size is normal. The lungs are free of focal consolidations and pleural effusions. Minimal RIGHT LOWER lobe  atelectasis or scarring. Remote rib fractures. Chronic changes in both shoulders. IMPRESSION: No active disease. Electronically Signed   By: Nolon Nations M.D.   On: 01/25/2019 13:18  EKG: no EKG available.   Assessment/Plan Active Problems:   Hypertension   HLD (hyperlipidemia)   Hypothyroidism   Personal history of ovarian cancer   Osteoporosis   Fibromyalgia   Hyperparathyroidism ([place])   Asthma, chronic   Type 2 diabetes mellitus with hyperglycemia, with long-term current use of insulin (HCC)   Diastolic CHF, chronic (HCC)   UTI (urinary tract infection)   Acute lower UTI   Acute metabolic encephalopathy  1-Early sepsis secondary to urinary tract infection: -Patient presented with confusion, tachypnea, tachycardia, leukocytosis.  Source of infection probably UTI with more than 50 white blood cells in urine. -Agree with continuation of ciprofloxacin,  prior urine culture grew Proteus resistant to ceftriaxone. -Follow blood cultures, urine culture. -Gentle hydration. -Check CT renal protocol rule out a stone.  2-Acute on chronic COPD exacerbation: -Patient hypoxic, quiring 4 L of oxygen.  She is on oxygen at home as needed. -Will start IV Solu-Medrol. -Chest x-ray negative for pneumonia. -Concern for aspiration, will remain n.p.o. overnight.  A speech and swallow evaluation.  Patient with diagnosis of radial dysphagia and esophageal dysmotility. -Will start Albuterol and ipratropium inhaler -Covid 19 pending.  3-Hypothyroidism: Continue with Synthroid.  4-Diabetes insulin-dependent: Continue with insulin and a sliding scale.  5-Dysphagia, esophageal dysmotility disorder; Keep NPO.   speech evaluation.  She was supposed to have endoscopy November 16.  She will need to be stable for procedure. Start IV PPI twice daily.  6-frequent fall: High risk for fall.  Will require PT eval.  7-Anemia; check anemia panel in am.  8-COVID-19 screening: I used N95, face  shield.    DVT Prophylaxis: Lovenox Code Status: DNR Family Communication: Daughter at bedside Disposition Plan: Admit under observation for treatment of early sepsis, UTI, dysphagia   Time spent: 75 minutes.   Elmarie Shiley MD [facility]   01/25/2019, 3:38 PM

## 2019-01-25 NOTE — Progress Notes (Signed)
Patient was transferred to 5 E at 1656. Alert and oriented x 2. Daughter at bedside. Low bed set up, room is set up and call light within patient's reach. Waiting for medication are verified by pharmacist. Will monitor patient as protocol.

## 2019-01-26 ENCOUNTER — Ambulatory Visit: Payer: Medicare Other | Admitting: Family Medicine

## 2019-01-26 ENCOUNTER — Inpatient Hospital Stay (HOSPITAL_COMMUNITY): Payer: Medicare Other

## 2019-01-26 DIAGNOSIS — N3000 Acute cystitis without hematuria: Secondary | ICD-10-CM | POA: Diagnosis not present

## 2019-01-26 DIAGNOSIS — R296 Repeated falls: Secondary | ICD-10-CM | POA: Diagnosis present

## 2019-01-26 DIAGNOSIS — J441 Chronic obstructive pulmonary disease with (acute) exacerbation: Secondary | ICD-10-CM | POA: Diagnosis present

## 2019-01-26 DIAGNOSIS — E039 Hypothyroidism, unspecified: Secondary | ICD-10-CM | POA: Diagnosis not present

## 2019-01-26 DIAGNOSIS — B961 Klebsiella pneumoniae [K. pneumoniae] as the cause of diseases classified elsewhere: Secondary | ICD-10-CM | POA: Diagnosis present

## 2019-01-26 DIAGNOSIS — T380X5A Adverse effect of glucocorticoids and synthetic analogues, initial encounter: Secondary | ICD-10-CM | POA: Diagnosis not present

## 2019-01-26 DIAGNOSIS — A419 Sepsis, unspecified organism: Secondary | ICD-10-CM | POA: Diagnosis present

## 2019-01-26 DIAGNOSIS — I10 Essential (primary) hypertension: Secondary | ICD-10-CM | POA: Diagnosis not present

## 2019-01-26 DIAGNOSIS — S199XXA Unspecified injury of neck, initial encounter: Secondary | ICD-10-CM | POA: Diagnosis not present

## 2019-01-26 DIAGNOSIS — K224 Dyskinesia of esophagus: Secondary | ICD-10-CM | POA: Diagnosis present

## 2019-01-26 DIAGNOSIS — I5032 Chronic diastolic (congestive) heart failure: Secondary | ICD-10-CM

## 2019-01-26 DIAGNOSIS — M542 Cervicalgia: Secondary | ICD-10-CM | POA: Diagnosis present

## 2019-01-26 DIAGNOSIS — Z8543 Personal history of malignant neoplasm of ovary: Secondary | ICD-10-CM | POA: Diagnosis not present

## 2019-01-26 DIAGNOSIS — E213 Hyperparathyroidism, unspecified: Secondary | ICD-10-CM | POA: Diagnosis not present

## 2019-01-26 DIAGNOSIS — Z20828 Contact with and (suspected) exposure to other viral communicable diseases: Secondary | ICD-10-CM | POA: Diagnosis present

## 2019-01-26 DIAGNOSIS — R519 Headache, unspecified: Secondary | ICD-10-CM | POA: Diagnosis not present

## 2019-01-26 DIAGNOSIS — D539 Nutritional anemia, unspecified: Secondary | ICD-10-CM | POA: Diagnosis present

## 2019-01-26 DIAGNOSIS — M797 Fibromyalgia: Secondary | ICD-10-CM | POA: Diagnosis not present

## 2019-01-26 DIAGNOSIS — J9621 Acute and chronic respiratory failure with hypoxia: Secondary | ICD-10-CM | POA: Diagnosis present

## 2019-01-26 DIAGNOSIS — J4530 Mild persistent asthma, uncomplicated: Secondary | ICD-10-CM | POA: Diagnosis not present

## 2019-01-26 DIAGNOSIS — Z66 Do not resuscitate: Secondary | ICD-10-CM | POA: Diagnosis present

## 2019-01-26 DIAGNOSIS — S0990XA Unspecified injury of head, initial encounter: Secondary | ICD-10-CM | POA: Diagnosis not present

## 2019-01-26 DIAGNOSIS — E1151 Type 2 diabetes mellitus with diabetic peripheral angiopathy without gangrene: Secondary | ICD-10-CM | POA: Diagnosis present

## 2019-01-26 DIAGNOSIS — I110 Hypertensive heart disease with heart failure: Secondary | ICD-10-CM | POA: Diagnosis present

## 2019-01-26 DIAGNOSIS — N390 Urinary tract infection, site not specified: Secondary | ICD-10-CM | POA: Diagnosis not present

## 2019-01-26 DIAGNOSIS — M810 Age-related osteoporosis without current pathological fracture: Secondary | ICD-10-CM | POA: Diagnosis present

## 2019-01-26 DIAGNOSIS — Z794 Long term (current) use of insulin: Secondary | ICD-10-CM | POA: Diagnosis not present

## 2019-01-26 DIAGNOSIS — R6520 Severe sepsis without septic shock: Secondary | ICD-10-CM | POA: Diagnosis present

## 2019-01-26 DIAGNOSIS — E1165 Type 2 diabetes mellitus with hyperglycemia: Secondary | ICD-10-CM | POA: Diagnosis not present

## 2019-01-26 DIAGNOSIS — G9341 Metabolic encephalopathy: Secondary | ICD-10-CM | POA: Diagnosis not present

## 2019-01-26 DIAGNOSIS — D509 Iron deficiency anemia, unspecified: Secondary | ICD-10-CM | POA: Diagnosis present

## 2019-01-26 DIAGNOSIS — G9340 Encephalopathy, unspecified: Secondary | ICD-10-CM | POA: Diagnosis not present

## 2019-01-26 DIAGNOSIS — E785 Hyperlipidemia, unspecified: Secondary | ICD-10-CM | POA: Diagnosis not present

## 2019-01-26 LAB — CBC
HCT: 33.4 % — ABNORMAL LOW (ref 36.0–46.0)
Hemoglobin: 10.1 g/dL — ABNORMAL LOW (ref 12.0–15.0)
MCH: 32.3 pg (ref 26.0–34.0)
MCHC: 30.2 g/dL (ref 30.0–36.0)
MCV: 106.7 fL — ABNORMAL HIGH (ref 80.0–100.0)
Platelets: 274 10*3/uL (ref 150–400)
RBC: 3.13 MIL/uL — ABNORMAL LOW (ref 3.87–5.11)
RDW: 13.3 % (ref 11.5–15.5)
WBC: 14.8 10*3/uL — ABNORMAL HIGH (ref 4.0–10.5)
nRBC: 0 % (ref 0.0–0.2)

## 2019-01-26 LAB — FOLATE: Folate: 22.1 ng/mL (ref >5.9)

## 2019-01-26 LAB — GLUCOSE, CAPILLARY
Glucose-Capillary: 162 mg/dL — ABNORMAL HIGH (ref 70–99)
Glucose-Capillary: 109 mg/dL — ABNORMAL HIGH (ref 70–99)
Glucose-Capillary: 279 mg/dL — ABNORMAL HIGH (ref 70–99)
Glucose-Capillary: 282 mg/dL — ABNORMAL HIGH (ref 70–99)

## 2019-01-26 LAB — FERRITIN: Ferritin: 81 ng/mL (ref 11–307)

## 2019-01-26 LAB — RETICULOCYTES
Immature Retic Fract: 15.3 % (ref 2.3–15.9)
RBC.: 3.13 MIL/uL — ABNORMAL LOW (ref 3.87–5.11)
Retic Count, Absolute: 38.8 10*3/uL (ref 19.0–186.0)
Retic Ct Pct: 1.2 % (ref 0.4–3.1)

## 2019-01-26 LAB — IRON AND TIBC
Iron: 11 ug/dL — ABNORMAL LOW (ref 28–170)
Saturation Ratios: 4 % — ABNORMAL LOW (ref 10.4–31.8)
TIBC: 280 ug/dL (ref 250–450)
UIBC: 269 ug/dL

## 2019-01-26 LAB — VITAMIN B12: Vitamin B-12: 2343 pg/mL — ABNORMAL HIGH (ref 180–914)

## 2019-01-26 MED ORDER — ALBUTEROL SULFATE (2.5 MG/3ML) 0.083% IN NEBU
2.5000 mg | INHALATION_SOLUTION | Freq: Three times a day (TID) | RESPIRATORY_TRACT | Status: DC
  Administered 2019-01-26: 09:00:00 2.5 mg via RESPIRATORY_TRACT
  Filled 2019-01-26: qty 3

## 2019-01-26 MED ORDER — MORPHINE SULFATE (PF) 2 MG/ML IV SOLN
1.0000 mg | Freq: Once | INTRAVENOUS | Status: AC
  Administered 2019-01-26: 16:00:00 1 mg via INTRAVENOUS
  Filled 2019-01-26: qty 1

## 2019-01-26 MED ORDER — MORPHINE SULFATE (PF) 2 MG/ML IV SOLN
1.0000 mg | INTRAVENOUS | Status: DC | PRN
  Administered 2019-01-26 – 2019-01-28 (×4): 1 mg via INTRAVENOUS
  Filled 2019-01-26 (×4): qty 1

## 2019-01-26 MED ORDER — LEVOFLOXACIN IN D5W 750 MG/150ML IV SOLN
750.0000 mg | INTRAVENOUS | Status: DC
  Administered 2019-01-26: 21:00:00 750 mg via INTRAVENOUS
  Filled 2019-01-26: qty 150

## 2019-01-26 MED ORDER — ALBUTEROL SULFATE (2.5 MG/3ML) 0.083% IN NEBU
2.5000 mg | INHALATION_SOLUTION | Freq: Two times a day (BID) | RESPIRATORY_TRACT | Status: DC
  Administered 2019-01-26 – 2019-01-30 (×8): 2.5 mg via RESPIRATORY_TRACT
  Filled 2019-01-26 (×8): qty 3

## 2019-01-26 MED ORDER — GUAIFENESIN ER 600 MG PO TB12
1200.0000 mg | ORAL_TABLET | Freq: Two times a day (BID) | ORAL | Status: DC
  Administered 2019-01-26 – 2019-01-30 (×9): 1200 mg via ORAL
  Filled 2019-01-26 (×9): qty 2

## 2019-01-26 NOTE — TOC Initial Note (Signed)
Transition of Care [facility]) - Initial/Assessment Note    Patient Details  Name: Kayla Thompson MRN: [id number] Date of Birth: 1935/07/12  [facility]) CM/SW Contact:    Trish Mage, LCSW Phone Number: 01/26/2019, 10:50 AM  Clinical Narrative:   Met with patient as result of consult alerting of care givers in home.  Ms Yearwood is alert, engaged and conversational.  She states her daughter stays with her, and she has an aide in home 5 days a week.  She is complaining of pain, and states she fell a few days ago, breaking her shower door in the process.  She is oriented X1, and gives me permission to talk to daughter [name].  [name] confirms that what I have been told by her mother is accurate, says she has been having some increased confusion and weakness recently, and [name] is concerned about her coming home.  The aide is in the home during the day only, and [name] lives in her own home, but has been coming over daily, and staying with her mother on the weekends.  Dr agreed to have PT evaluate.  TOC will continue to follow during the course of hospitalization.               Expected Discharge Plan: Skilled Nursing Facility Barriers to Discharge: SNF Pending bed offer   Patient Goals and CMS Choice   CMS Medicare.gov Compare Post Acute Care list provided to:: Patient Choice offered to / list presented to : Patient, Adult Children  Expected Discharge Plan and Services Expected Discharge Plan: [facility]   Discharge Planning Services: CM Consult Post Acute Care Choice: [place] Living arrangements for the past 2 months: Single Family Home                                      Prior Living Arrangements/Services Living arrangements for the past 2 months: Single Family Home Lives with:: Self Patient language and need for interpreter reviewed:: Yes Do you feel safe going back to the place where you live?: Yes      Need for Family Participation in  Patient Care: Yes (Comment) Care giver support system in place?: Yes (comment) Current home services: DME Criminal Activity/Legal Involvement Pertinent to Current Situation/Hospitalization: No - Comment as needed  Activities of Daily Living Home Assistive Devices/Equipment: Cane (specify quad or straight), CBG Meter, Eyeglasses, Nebulizer, Oxygen, Reacher, Walker (specify type), Shower chair with back(tripod walker, raised toilet seat) ADL Screening (condition at time of admission) Patient's cognitive ability adequate to safely complete daily activities?: No Is the patient deaf or have difficulty hearing?: No Does the patient have difficulty seeing, even when wearing glasses/contacts?: No Does the patient have difficulty concentrating, remembering, or making decisions?: Yes Patient able to express need for assistance with ADLs?: Yes Does the patient have difficulty dressing or bathing?: Yes Independently performs ADLs?: No Communication: Independent Dressing (OT): Dependent Is this a change from baseline?: Change from baseline, expected to last >3 days Grooming: Dependent Is this a change from baseline?: Change from baseline, expected to last >3 days Feeding: Dependent Is this a change from baseline?: Change from baseline, expected to last >3 days Bathing: Dependent Is this a change from baseline?: Change from baseline, expected to last >3 days Toileting: Dependent Is this a change from baseline?: Change from baseline, expected to last >3days In/Out Bed: Dependent Is this a  change from baseline?: Change from baseline, expected to last >3 days Walks in Home: Dependent Is this a change from baseline?: Change from baseline, expected to last >3 days Does the patient have difficulty walking or climbing stairs?: Yes(secondary to weakness) Weakness of Legs: Both Weakness of Arms/Hands: Both  Permission Sought/Granted Permission sought to share information with : Family Supports    Share  Information with NAME: [name]     Permission granted to share info w Relationship: dughter  Permission granted to share info w Contact Information: [phone]  Emotional Assessment Appearance:: Appears stated age Attitude/Demeanor/Rapport: Engaged Affect (typically observed): Appropriate Orientation: : Oriented to Self Alcohol / Substance Use: Not Applicable Psych Involvement: No (comment)  Admission diagnosis:  Lower urinary tract infectious disease [N39.0] Encephalopathy [G93.40] Sepsis ([place]) [A41.9] Patient Active Problem List   Diagnosis Date Noted  . UTI (urinary tract infection) 01/25/2019  . Acute lower UTI 01/25/2019  . Acute metabolic encephalopathy 70/03/7492  . Sepsis ( [place]) 01/25/2019  . Encounter for general adult medical examination with abnormal findings 01/09/2019  . Abnormal barium swallow 12/28/2018  . Throat clearing 12/28/2018  . History of colonic polyps 12/28/2018  . Dysphagia 12/28/2018  . Diabetic foot ulcer ([place]) 12/05/2018  . Rotator cuff arthropathy, left 11/16/2018  . Degenerative arthritis of left knee 11/16/2018  . Insomnia 09/26/2018  . Leukocytosis 10/17/2017  . Malnutrition of moderate degree 10/10/2017  . Memory changes 07/29/2017  . Granulomatous lung disease ([place]) 06/14/2017  . Tracheobronchomalacia 06/14/2017  . Dizziness 03/04/2017  . Exercise hypoxemia 07/12/2016  . Diastolic CHF, chronic ([place]) 12/25/2015  . GERD (gastroesophageal reflux disease) 12/25/2015  . Epidermal inclusion cyst 09/02/2015  . Type 2 diabetes mellitus with hyperglycemia, with long-term current use of insulin ([place]) 03/10/2015  . Muscle cramps 03/06/2015  . Bronchiectasis without acute exacerbation ([place]) 01/07/2015  . PMR (polymyalgia rheumatica) ([place]) 01/07/2015  . Osteoarthritis 01/07/2015  . Asthma, chronic 11/12/2014  . Anxiety state 11/12/2014  . Hyperparathyroidism ([place]) 10/28/2014  . Fibromyalgia 09/21/2014  . Personal history of ovarian cancer  08/23/2014  . Osteoporosis 08/23/2014  . Hypertension 08/02/2014  . HLD (hyperlipidemia) 08/02/2014  . Hypothyroidism 08/02/2014   PCP:  [name], MD Pharmacy:   [id number]: [phone]: [phone] [facility] [address]Phone: [phone]: [phone] [place] [address] [address]Phone: [phone]: [phone]    Social Determinants of Health (SDOH) Interventions    Readmission Risk Interventions No flowsheet data found.

## 2019-01-26 NOTE — Progress Notes (Addendum)
[facility] PROGRESS NOTE   KRISELLE HEAVILIN [id number] DOB: [birth year] DOA: 01/25/2019 PCP: [name], MD  HPI/Subjective: Kayla Thompson is a 83 y.o. female medical history significant for diabetes, fibromyalgia, osteoporosis, hypertension, asthma, chronic diastolic heart failure who presents with generalized weakness, confusion and fever found to have UTI.  Patient seen and examined at, daughter is at bedside and help with history.  Patient has been very weak generalized weakness, confused, unable to stand up.  Patient fell the morning of admission and was not able to stand up.  Patient has been having worsening cough and shortness of breath and wheezing for the last 2 days.  Evaluation in the ED: Patient was found to be febrile temperature 102, tachycardia 113, respiration rate 24, leukocytosis 17, hemoglobin 10, UA white blood cell more than 50, moderate hemoglobin.  Assessment/Plan: Active Problems:   Hypertension   HLD (hyperlipidemia)   Hypothyroidism   Personal history of ovarian cancer   Osteoporosis   Fibromyalgia   Hyperparathyroidism ([place])   Asthma, chronic   Type 2 diabetes mellitus with hyperglycemia, with long-term current use of insulin (HCC)   Diastolic CHF, chronic (HCC)   UTI (urinary tract infection)   Acute lower UTI   Acute metabolic encephalopathy   Sepsis ([place])   Early sepsis secondary to urinary tract infection: -Patient presented with confusion, tachypnea, tachycardia, leukocytosis.  Source of infection probably UTI with more than 50 white blood cells in urine. -Agree with continuation of ciprofloxacin,  prior urine culture grew Proteus resistant to ceftriaxone. CT scan showed multiple intrarenal right-sided stones without obstruction. Continue current antibiotics.  Sepsis This is present on admission, temp is 102.1 and heart rate 114. This is secondary to UTI and COPD exacerbation.  Acute metabolic encephalopathy Patient was  confused when she came in this is likely secondary to UTI.  Acute on chronic COPD exacerbation: -Patient hypoxic,  requiring 4 L of oxygen. -Will start IV Solu-Medrol. -Chest x-ray negative for pneumonia. -Concern for aspiration, will remain n.p.o. overnight.  A speech and swallow evaluation.  Patient with diagnosis of radial dysphagia and esophageal dysmotility. -Will start Albuterol and ipratropium inhaler -SARS-CoV-2 is negative  Acute on chronic respiratory failure with hypoxia She required 2 L of oxygen at home as needed. Admitted on 4 L of oxygen, this is likely secondary to COPD exacerbation.  Hypothyroidism: Continue with Synthroid.  Diabetes insulin-dependent: Continue with insulin and a sliding scale.  Dysphagia, esophageal dysmotility disorder; Keep NPO.   speech evaluation.  She was supposed to have endoscopy November 16.  She will need to be stable for procedure. Start IV PPI twice daily.  Frequent fall: High risk for fall.  Will require PT eval.  Anemia; check anemia panel in am.   Code Status: DNR Family Communication: Plan discussed with the patient. Disposition Plan: Remains inpatient Diet:  Diet Order            Diet NPO time specified  Diet effective now              Consultants:  NA  Procedures:  NA  Antibiotics:  CIpro started on admission   Objective: Vitals:   01/26/19 0500 01/26/19 0831  BP:    Pulse:    Resp:    Temp:    SpO2: 100% 98%    Intake/Output Summary (Last 24 hours) at 01/26/2019 1036 Last data filed at 01/26/2019 0905 Gross per 24 hour  Intake 205.03 ml  Output 1050 ml  Net -844.97 ml  Filed Weights   01/25/19 1814  Weight: 67.6 kg    Exam: General: Alert and awake, oriented x3, not in any acute distress. HEENT: anicteric sclera, pupils reactive to light and accommodation, EOMI CVS: S1-S2 clear, no murmur rubs or gallops Chest: clear to auscultation bilaterally, no wheezing, rales or  rhonchi Abdomen: soft nontender, nondistended, normal bowel sounds, no organomegaly Extremities: no cyanosis, clubbing or edema noted bilaterally Neuro: Cranial nerves II-XII intact, no focal neurological deficits  Data Reviewed: Basic Metabolic Panel: Recent Labs  Lab 01/25/19 1249  NA 135  K 3.7  CL 100  CO2 25  GLUCOSE 231*  BUN 11  CREATININE 0.97  CALCIUM 8.7*   Liver Function Tests: No results for input(s): AST, ALT, ALKPHOS, BILITOT, PROT, ALBUMIN in the last 168 hours. No results for input(s): LIPASE, AMYLASE in the last 168 hours. No results for input(s): AMMONIA in the last 168 hours. CBC: Recent Labs  Lab 01/25/19 1249 01/26/19 0541  WBC 17.4* 14.8*  NEUTROABS 12.7*  --   HGB 10.7* 10.1*  HCT 34.3* 33.4*  MCV 103.0* 106.7*  PLT 324 274   Cardiac Enzymes: No results for input(s): CKTOTAL, CKMB, CKMBINDEX, TROPONINI in the last 168 hours. BNP (last 3 results) No results for input(s): BNP in the last 8760 hours.  ProBNP (last 3 results) No results for input(s): PROBNP in the last 8760 hours.  CBG: Recent Labs  Lab 01/25/19 1736 01/25/19 2213 01/26/19 0742  GLUCAP 232* 104* 162*    Micro Recent Results (from the past 240 hour(s))  SARS CORONAVIRUS 2 (TAT 6-24 HRS) Nasopharyngeal Nasopharyngeal Swab     Status: None   Collection Time: 01/25/19 11:50 AM   Specimen: Nasopharyngeal Swab  Result Value Ref Range Status   SARS Coronavirus 2 NEGATIVE NEGATIVE Final    Comment: (NOTE) SARS-CoV-2 target nucleic acids are NOT DETECTED. The SARS-CoV-2 RNA is generally detectable in upper and lower respiratory specimens during the acute phase of infection. Negative results do not preclude SARS-CoV-2 infection, do not rule out co-infections with other pathogens, and should not be used as the sole basis for treatment or other patient management decisions. Negative results must be combined with clinical observations, patient history, and epidemiological  information. The expected result is Negative. Fact Sheet for Patients: SugarRoll.be Fact Sheet for Healthcare Providers: https://www.woods-mathews.com/ This test is not yet approved or cleared by the [place] FDA and  has been authorized for detection and/or diagnosis of SARS-CoV-2 by FDA under an Emergency Use Authorization (EUA). This EUA will remain  in effect (meaning this test can be used) for the duration of the COVID-19 declaration under Section 56 4(b)(1) of the Act, 21 U.S.C. section 360bbb-3(b)(1), unless the authorization is terminated or revoked sooner. Performed at [facility], [place] [address] [place], [place] [address]   Blood culture (routine x 2)     Status: None (Preliminary result)   Collection Time: 01/25/19  4:09 PM   Specimen: BLOOD  Result Value Ref Range Status   Specimen Description   Final    BLOOD LEFT ANTECUBITAL Performed at [facility], [place] [address] [place], [place] [address]    Special Requests   Final    BOTTLES DRAWN AEROBIC AND ANAEROBIC Blood Culture adequate volume Performed at [facility] [address], [place], [place] [address]    Culture PENDING  Incomplete   Report Status PENDING  Incomplete     Studies: Dg Chest Portable 1 View  Result Date: 01/25/2019 CLINICAL DATA:  83 y.o. Female. Per EMS- pt c/o dysuria, foul smelling urine x 4-5 days. Family reports AMS starting at same time. Pt hx of COPD- 2 breathing tx daily, none taken today. Wears O2 PRN at home. EXAM: PORTABLE CHEST 1 VIEW COMPARISON:  11/07/2017 FINDINGS: Heart size is normal. The lungs are free of focal consolidations and pleural effusions. Minimal RIGHT LOWER lobe atelectasis or scarring. Remote rib fractures. Chronic changes in both shoulders. IMPRESSION: No active disease. Electronically Signed   By: Nolon Nations M.D.   On: 01/25/2019 13:18   Ct Renal Stone Study  Result Date:  01/25/2019 CLINICAL DATA:  Hematuria EXAM: CT ABDOMEN AND PELVIS WITHOUT CONTRAST TECHNIQUE: Multidetector CT imaging of the abdomen and pelvis was performed following the standard protocol without IV contrast. COMPARISON:  None. FINDINGS: Lower chest: Lung bases demonstrate 5 mm right middle lobe pulmonary nodule, series 4, image number 14. Scarring within the right lung base. 5 mm anterior left lung base nodule, series 4, image number 12. There are 2 small 3 mm lingular nodules. Heart size within normal limits. Coronary calcification. Small hiatal hernia Hepatobiliary: Dilated gallbladder with increased density suggesting sludge. No focal hepatic abnormality or biliary dilatation Pancreas: Unremarkable. No pancreatic ductal dilatation or surrounding inflammatory changes. Spleen: Normal in size without focal abnormality. Adrenals/Urinary Tract: Adrenal glands are normal. No hydronephrosis. No ureteral stone. Small stones in the right kidney. Air within the urinary bladder. Mild wall thickening. Stomach/Bowel: The stomach is nonenlarged. No dilated small bowel. Large stools in the colon slightly hyperdense. Negative appendix. Vascular/Lymphatic: Moderate aortic atherosclerosis. No aneurysmal dilatation. No significantly enlarged lymph nodes. Reproductive: Status post hysterectomy. No adnexal masses. Other: Multiple pelvic sidewall clips.  No free fluid.  No free air. Musculoskeletal: Degenerative changes of the spine. Grade 1 anterolisthesis L5 on S1. Hardware in the right femur with artifact. IMPRESSION: 1. Intrarenal stones on the right without hydronephrosis or ureteral stone. Slight thick-walled appearance of bladder. Moderate air in the bladder, possibly due to recent instrumentation or catheterization. Correlate for this history, if no recent manipulation, could consider gas-forming infection. 2. Distended gallbladder with probable sludge. 3. Multiple pulmonary nodules. No follow-up needed if patient is  low-risk (and has no known or suspected primary neoplasm). Non-contrast chest CT can be considered in 12 months if patient is high-risk. This recommendation follows the consensus statement: Guidelines for Management of Incidental Pulmonary Nodules Detected on CT Images: From the Fleischner Society 2017; Radiology 2017; 284:228-243. Electronically Signed   By: Donavan Foil M.D.   On: 01/25/2019 17:00    Scheduled Meds:  albuterol  2.5 mg Nebulization TID   enoxaparin (LOVENOX) injection  40 mg Subcutaneous Q24H   insulin aspart  0-9 Units Subcutaneous TID WC   insulin glargine  5 Units Subcutaneous Q24H   levothyroxine  50 mcg Oral QAC breakfast   methylPREDNISolone (SOLU-MEDROL) injection  40 mg Intravenous Q24H   mometasone-formoterol  2 puff Inhalation BID   montelukast  10 mg Oral QHS   pantoprazole (PROTONIX) IV  40 mg Intravenous Q12H   umeclidinium bromide  1 puff Inhalation Daily   Continuous Infusions:  sodium chloride 75 mL/hr at 01/26/19 0616   ciprofloxacin 400 mg (01/26/19 0618)       Time spent: 35 minutes    [facility] Pager [phone] If 7PM-7AM, please contact night-coverage at [email or web address], password [facility] 01/26/2019, 10:36 AM  LOS: 0 days

## 2019-01-26 NOTE — Progress Notes (Signed)
PT Cancellation Note  Patient Details Name: Kayla Thompson MRN: [id number] DOB: 11-16-1935   Cancelled Treatment:    Reason Eval/Treat Not Completed: Pain limiting ability to participate;Other (comment)(Pt declined therapy reporting neck pain. Pt expressed desire to d/c home asap.)   Lelon Mast 01/26/2019, 11:23 AM

## 2019-01-26 NOTE — Evaluation (Signed)
Clinical/Bedside Swallow Evaluation Patient Details  Name: Kayla Thompson MRN: [id number] Date of Birth: Mar 31, 1935  Today's Date: 01/26/2019 Time: SLP Start Time (ACUTE ONLY): 1315 SLP Stop Time (ACUTE ONLY): 1415 SLP Time Calculation (min) (ACUTE ONLY): 60 min  Past Medical History:  Past Medical History:  Diagnosis Date  . Arthritis   . Asthma   . Diabetes mellitus without complication ([place])   . Fibromyalgia   . GERD (gastroesophageal reflux disease)   . History of chemotherapy   . History of fractured pelvis   . Hypertension   . Osteoporosis 12/2017   T score -2.8 stable from prior DEXA  . Osteoporosis   . Ovarian cancer [facility])    Past Surgical History:  Past Surgical History:  Procedure Laterality Date  . ABDOMINAL HYSTERECTOMY  1996  . ANKLE FRACTURE SURGERY     plate and screws  . BLADDER SUSPENSION    . CATARACT EXTRACTION    . EYE SURGERY    . hip surgey    . knee surgey    . LUNG SURGERY    . OOPHORECTOMY     BSO   HPI:  diabetes, fibromyalgia, osteoporosis, hypertension, asthma, chronic diastolic heart failure who presents with generalized weakness, confusion and fever found to have UTI per notes.  Pt saw ENT who noted her vocal folds were mobile and referred to GI regarading pt's dysphagia.  Pt has h/o COPD from 2nd hand smoke exposure and significant GERD.  She underwent an esophagram 10/2018 recently and was found to have severe dysmotility, ? esophagitis, type 1 hiatal hernia.  Pt is scheduled to have endoscopy done on 02/05/2019.   SLP swallow evaluation was ordered. She lives with her daughter and her daughter states she cuts up her mother's food.   Assessment / Plan / Recommendation Clinical Impression  Pt without focal cranial nerve deficits impacting swallow or speech musculature.  She does demonstrates clinically minimally delayed swallow with thin liquids but no overt indication of aspiration.  She did present with a single cough - with graham cracker  and admits to difficulty with particulate foods.   Per esophagram 10/2018 pt with severe dysmotility and hiatal hernia.    SLP recommends regular/thin diet with esophageal precautions.      Aspiration Risk  Mild aspiration risk    Diet Recommendation Regular;Thin liquid   Liquid Administration via: Cup;Straw Medication Administration: Whole meds with puree(or as tolerated) Supervision: Patient able to self feed Compensations: Slow rate;Small sips/bites Postural Changes: Seated upright at 90 degrees;Remain upright for at least 30 minutes after po intake    Other  Recommendations Oral Care Recommendations: Oral care BID Other Recommendations: Clarify dietary restrictions   Follow up Recommendations None      Frequency and Duration   n/a         Prognosis   n/a     Swallow Study   General Date of Onset: 01/26/19 HPI: diabetes, fibromyalgia, osteoporosis, hypertension, asthma, chronic diastolic heart failure who presents with generalized weakness, confusion and fever found to have UTI per notes.  Pt saw ENT who noted her vocal folds were mobile and referred to GI regarading pt's dysphagia.  Pt has h/o COPD from 2nd hand smoke exposure and significant GERD.  She underwent an esophagram 10/2018 recently and was found to have severe dysmotility, ? esophagitis, type 1 hiatal hernia.  Pt is scheduled to have endoscopy done on 02/05/2019.   SLP swallow evaluation was ordered. She lives with her daughter and her  daughter states she cuts up her mother's food. Previous Swallow Assessment: esophagram see hpi and imaging section of chart Diet Prior to this Study: NPO Temperature Spikes Noted: No Respiratory Status: Nasal cannula History of Recent Intubation: No Behavior/Cognition: Alert;Cooperative;Pleasant mood Oral Cavity Assessment: Within Functional Limits Oral Care Completed by SLP: Yes(slp assisted pt to brush her teeth - she was very xerostomic) Oral Cavity - Dentition: Adequate natural  dentition Vision: Functional for self-feeding Self-Feeding Abilities: Able to feed self Patient Positioning: Partially reclined(due to pt's terrible pain) Baseline Vocal Quality: Normal Volitional Cough: Strong Volitional Swallow: Able to elicit    Oral/Motor/Sensory Function Overall Oral Motor/Sensory Function: Within functional limits   Ice Chips Ice chips: Not tested   Thin Liquid Thin Liquid: Within functional limits Presentation: Self Fed;Cup;Straw    Nectar Thick Nectar Thick Liquid: Not tested   Honey Thick Honey Thick Liquid: Not tested   Puree Puree: Within functional limits(jello) Presentation: Self Fed;Spoon   Solid     Other Comments: cough x1 of 3 bites, pt admits to coughing with crumbly foods - eg rice      Macario Golds 01/26/2019,3:18 PM  Luanna Salk, [facility] SLP Acute Rehab Services Pager [phone] Office [phone]

## 2019-01-26 NOTE — Progress Notes (Signed)
Patient still reports concern with neck pain after being administered Morphine 1 mg IV and Tramadol 50 mg PO. Daughter has requested patient have a neck brace. Contacted PT who advised they do not provide neck braces. Advised to contact Ortho. Spoke with Jenny Reichmann who stated he would bring the patient a soft neck brace.

## 2019-01-26 NOTE — Progress Notes (Signed)
PT Cancellation Note  Patient Details Name: Kayla Thompson MRN: [id number] DOB: 03/03/1936   Cancelled Treatment:    Reason Eval/Treat Not Completed: Pain limiting ability to participate;Other (comment)(Pt declined therapy reporting neck pain. PT and her daughter reported concern that no imagin has been performed on her neck and wish to speak with an MD to make sure there are no acute injuries to her neck.)   Kipp Brood, PT, DPT Physical Therapist with [facility]  01/26/2019 5:51 PM

## 2019-01-27 DIAGNOSIS — Z794 Long term (current) use of insulin: Secondary | ICD-10-CM

## 2019-01-27 DIAGNOSIS — E1165 Type 2 diabetes mellitus with hyperglycemia: Secondary | ICD-10-CM

## 2019-01-27 DIAGNOSIS — N3000 Acute cystitis without hematuria: Secondary | ICD-10-CM

## 2019-01-27 DIAGNOSIS — Z8543 Personal history of malignant neoplasm of ovary: Secondary | ICD-10-CM

## 2019-01-27 LAB — URINE CULTURE: Culture: >=100000 — AB

## 2019-01-27 LAB — CBC
HCT: 31.7 % — ABNORMAL LOW (ref 36.0–46.0)
Hemoglobin: 10 g/dL — ABNORMAL LOW (ref 12.0–15.0)
MCH: 32.4 pg (ref 26.0–34.0)
MCHC: 31.5 g/dL (ref 30.0–36.0)
MCV: 102.6 fL — ABNORMAL HIGH (ref 80.0–100.0)
Platelets: 284 10*3/uL (ref 150–400)
RBC: 3.09 MIL/uL — ABNORMAL LOW (ref 3.87–5.11)
RDW: 12.8 % (ref 11.5–15.5)
WBC: 12.5 10*3/uL — ABNORMAL HIGH (ref 4.0–10.5)
nRBC: 0 % (ref 0.0–0.2)

## 2019-01-27 LAB — MAGNESIUM: Magnesium: 2 mg/dL (ref 1.7–2.4)

## 2019-01-27 LAB — BASIC METABOLIC PANEL
Anion gap: 12 (ref 5–15)
BUN: 9 mg/dL (ref 8–23)
CO2: 23 mmol/L (ref 22–32)
Calcium: 8.2 mg/dL — ABNORMAL LOW (ref 8.9–10.3)
Chloride: 101 mmol/L (ref 98–111)
Creatinine, Ser: 0.83 mg/dL (ref 0.44–1.00)
GFR calc Af Amer: >60 mL/min (ref >60)
GFR calc non Af Amer: >60 mL/min (ref >60)
Glucose, Bld: 272 mg/dL — ABNORMAL HIGH (ref 70–99)
Potassium: 3.6 mmol/L (ref 3.5–5.1)
Sodium: 136 mmol/L (ref 135–145)

## 2019-01-27 LAB — GLUCOSE, CAPILLARY
Glucose-Capillary: 291 mg/dL — ABNORMAL HIGH (ref 70–99)
Glucose-Capillary: 345 mg/dL — ABNORMAL HIGH (ref 70–99)
Glucose-Capillary: 283 mg/dL — ABNORMAL HIGH (ref 70–99)
Glucose-Capillary: 303 mg/dL — ABNORMAL HIGH (ref 70–99)

## 2019-01-27 LAB — TSH: TSH: 0.11 u[IU]/mL — ABNORMAL LOW (ref 0.350–4.500)

## 2019-01-27 MED ORDER — PANTOPRAZOLE SODIUM 40 MG PO TBEC
40.0000 mg | DELAYED_RELEASE_TABLET | Freq: Two times a day (BID) | ORAL | Status: DC
  Administered 2019-01-27 – 2019-01-30 (×6): 40 mg via ORAL
  Filled 2019-01-27 (×6): qty 1

## 2019-01-27 MED ORDER — POTASSIUM CHLORIDE CRYS ER 20 MEQ PO TBCR
40.0000 meq | EXTENDED_RELEASE_TABLET | Freq: Once | ORAL | Status: AC
  Administered 2019-01-27: 40 meq via ORAL
  Filled 2019-01-27: qty 2

## 2019-01-27 MED ORDER — LEVOFLOXACIN IN D5W 500 MG/100ML IV SOLN: 500.0000 mg | INTRAVENOUS | Status: DC

## 2019-01-27 MED ORDER — PREDNISONE 20 MG PO TABS
40.0000 mg | ORAL_TABLET | Freq: Every day | ORAL | Status: DC
  Administered 2019-01-27 – 2019-01-29 (×3): 40 mg via ORAL
  Filled 2019-01-27 (×3): qty 2

## 2019-01-27 MED ORDER — SODIUM CHLORIDE 0.9 % IV SOLN
1.0000 g | INTRAVENOUS | Status: DC
  Administered 2019-01-27 – 2019-01-30 (×4): 1 g via INTRAVENOUS
  Filled 2019-01-27 (×2): qty 1
  Filled 2019-01-27: qty 10
  Filled 2019-01-27: qty 1

## 2019-01-27 MED ORDER — INSULIN ASPART 100 UNIT/ML ~~LOC~~ SOLN
0.0000 [IU] | Freq: Three times a day (TID) | SUBCUTANEOUS | Status: DC
  Administered 2019-01-27: 17:00:00 8 [IU] via SUBCUTANEOUS
  Administered 2019-01-27 – 2019-01-28 (×3): 11 [IU] via SUBCUTANEOUS
  Administered 2019-01-28 – 2019-01-29 (×2): 15 [IU] via SUBCUTANEOUS
  Administered 2019-01-29: 13:00:00 3 [IU] via SUBCUTANEOUS
  Administered 2019-01-29: 09:00:00 5 [IU] via SUBCUTANEOUS
  Administered 2019-01-30: 2 [IU] via SUBCUTANEOUS

## 2019-01-27 NOTE — Evaluation (Signed)
Physical Therapy Evaluation Patient Details Name: Kayla Thompson MRN: [id number] DOB: 11-02-1935 Today's Date: 01/27/2019   History of Present Illness  83 yo female admitted to ED on 11/5 with UTI sepsis, fall OOB. Pt with neck pain since fall, CT head and neck negative for acute findings. PMH includes OA, DM, fibromyalgia, GERD, HTN, osteoporosis, COPD on supplemental O2 PRN  Clinical Impression   Pt presents with moderate neck pain s/p fall, LE weakness, unsteadiness in standing with history of multiple falls, difficulty performing mobility tasks, decreased activity tolerance, and tachycardia with mobility to 119 bpm. Pt to benefit from acute PT to address deficits. Pt ambulated 15 ft with RW and min assist for steadying, no LOB but pt is generally unsteady. PT recommending pt continue to have [place] aide, and PT recommending HHPT to address mobility deficits upon d/c. PT to progress mobility as tolerated, and will continue to follow acutely.      Follow Up Recommendations Home health PT;Supervision/Assistance - 24 hour    Equipment Recommendations  None recommended by PT    Recommendations for Other Services       Precautions / Restrictions Precautions Precautions: Fall Precaution Comments: low bed precautions Restrictions Weight Bearing Restrictions: No      Mobility  Bed Mobility Overal bed mobility: Needs Assistance Bed Mobility: Supine to Sit     Supine to sit: Mod assist;HOB elevated     General bed mobility comments: Mod assist for trunk elevation, scooting to EOB with use of bed pads. Verbal cuing for sequencing, use of bedrail to assist with coming to EOB.  Transfers Overall transfer level: Needs assistance Equipment used: Rolling walker (2 wheeled) Transfers: Sit to/from [facility] Sit to Stand: Min assist;From elevated surface Stand pivot transfers: Min assist;From elevated surface       General transfer comment: Min assist for power up,  steadying. Verbal cuing for hand placement when rising. Min assist for stand pivot from recliner<>BSC for steadying, HHA, and guiding pt to destination surface with tactile facilitation.  Ambulation/Gait Ambulation/Gait assistance: Min assist Gait Distance (Feet): 15 Feet Assistive device: Rolling walker (2 wheeled) Gait Pattern/deviations: Step-through pattern;Decreased stride length;Trunk flexed Gait velocity: decr   General Gait Details: Min assist for light steadying, verbal cuing for placement in RW. Sats >90% on RA during mobility, O2 not reapplied as pt uses it on PRN basis.  Stairs            Wheelchair Mobility    Modified Rankin (Stroke Patients Only)       Balance Overall balance assessment: Needs assistance;History of Falls(pt reports 4 falls in the past year) Sitting-balance support: No upper extremity supported;Feet supported Sitting balance-Leahy Scale: Fair     Standing balance support: Bilateral upper extremity supported Standing balance-Leahy Scale: Poor Standing balance comment: reliant on external support                             Pertinent Vitals/Pain Pain Assessment: Faces Faces Pain Scale: Hurts even more Pain Location: neck Pain Descriptors / Indicators: Sore Pain Intervention(s): Limited activity within patient's tolerance;Monitored during session;Patient requesting pain meds-RN notified;Repositioned    Home Living Family/patient expects to be discharged to:: Private residence Living Arrangements: Children Available Help at Discharge: Family Type of Home: House Home Access: Stairs to enter   [employer] of Steps: 1 (threshold) Home Layout: One level Home Equipment: [place] - 4 wheels;Shower seat;Walker - 2 wheels;Cane - single point;Bedside commode  Prior Function Level of Independence: Needs assistance   Gait / Transfers Assistance Needed: Pt ambulates with walker in home, short distances  ADL's /  Homemaking Assistance Needed: Pt reports receiving assist for household tasks, but is able to dress and bathe self.  Comments: Pt has an aide 5 days a week from 1100-1700, ambulates with ripod walker     Hand Dominance   Dominant Hand: Right    Extremity/Trunk Assessment   Upper Extremity Assessment Upper Extremity Assessment: Generalized weakness    Lower Extremity Assessment Lower Extremity Assessment: Generalized weakness    Cervical / Trunk Assessment Cervical / Trunk Assessment: Kyphotic  Communication   Communication: No difficulties  Cognition Arousal/Alertness: Awake/alert Behavior During Therapy: WFL for tasks assessed/performed Overall Cognitive Status: Within Functional Limits for tasks assessed                                        General Comments      Exercises     Assessment/Plan    PT Assessment Patient needs continued PT services  PT Problem List Decreased strength;Decreased mobility;Decreased safety awareness;Decreased activity tolerance;Decreased balance;Decreased knowledge of use of DME;Pain       PT Treatment Interventions DME instruction;Therapeutic activities;Gait training;Therapeutic exercise;Patient/family education;Stair training;Balance training;Functional mobility training    PT Goals (Current goals can be found in the Care Plan section)  Acute Rehab PT Goals Patient Stated Goal: walk more PT Goal Formulation: With patient Time For Goal Achievement: 02/10/19 Potential to Achieve Goals: Good    Frequency Min 3X/week   Barriers to discharge        Co-evaluation               AM-PAC PT "6 Clicks" Mobility  Outcome Measure Help needed turning from your back to your side while in a flat bed without using bedrails?: A Lot Help needed moving from lying on your back to sitting on the side of a flat bed without using bedrails?: A Lot Help needed moving to and from a bed to a chair (including a wheelchair)?: A  Little Help needed standing up from a chair using your arms (e.g., wheelchair or bedside chair)?: A Little Help needed to walk in hospital room?: A Little Help needed climbing 3-5 steps with a railing? : A Lot 6 Click Score: 15    End of Session Equipment Utilized During Treatment: Gait belt Activity Tolerance: Patient limited by pain;Patient limited by fatigue Patient left: in chair;with chair alarm set;with call bell/phone within reach Nurse Communication: Mobility status;Patient requests pain meds PT Visit Diagnosis: Difficulty in walking, not elsewhere classified (R26.2);Muscle weakness (generalized) (M62.81);Repeated falls (R29.6)    Time: [id number] PT Time Calculation (min) (ACUTE ONLY): 30 min   Charges:   PT Evaluation $PT Eval Low Complexity: 1 Low PT Treatments $Gait Training: 8-22 mins        Liany Mumpower E, PT Acute Rehabilitation Services Pager [phone]  Office [phone]  Rylend Pietrzak D Elonda Husky 01/27/2019, 12:25 PM

## 2019-01-27 NOTE — Progress Notes (Signed)
Pericare provided to patient due to urinary incontinence. 300 mL of yellow colored urine present in purewick canister. Patient has complaint of labia pain. Redness to labia noted. Moisture barrier applied. New purewick placed. Skin assessment revealed rash and peeling of skin on back. Patient states this is due to use of Lidocaine patches when she was at home. Back cleaned and lotion applied. Patient sitting upright in bed eating breakfast. Set-up assist provided. Call bell within reach. Oxygen @ 2L [place]. SCDs in place. No signs of distress noted or voiced. Will continue to monitor. Denies any neck pain at this time.

## 2019-01-27 NOTE — Progress Notes (Addendum)
[facility] PROGRESS NOTE   Kayla Thompson [id number] DOB: February 24, 1936 DOA: 01/25/2019 PCP: [name], MD   Subjective: Patient seen with soft cervical collar, reported her neck pain improved since yesterday. CT scan of the head and neck negative for acute findings. Patient appears to be more awake and alert today.  Low-grade fever of 99.6 last night. Continue current antibiotics, PT to evaluate.  Discussed with the daughter over the phone  HPI: Kayla Thompson is a 83 y.o. female medical history significant for diabetes, fibromyalgia, osteoporosis, hypertension, asthma, chronic diastolic heart failure who presents with generalized weakness, confusion and fever found to have UTI.  Patient seen and examined at, daughter is at bedside and help with history.  Patient has been very weak generalized weakness, confused, unable to stand up.  Patient fell the morning of admission and was not able to stand up.  Patient has been having worsening cough and shortness of breath and wheezing for the last 2 days.  Evaluation in the ED: Patient was found to be febrile temperature 102, tachycardia 113, respiration rate 24, leukocytosis 17, hemoglobin 10, UA white blood cell more than 50, moderate hemoglobin.  Assessment/Plan: Active Problems:   Hypertension   HLD (hyperlipidemia)   Hypothyroidism   Personal history of ovarian cancer   Osteoporosis   Fibromyalgia   Hyperparathyroidism ([place])   Asthma, chronic   Type 2 diabetes mellitus with hyperglycemia, with long-term current use of insulin (HCC)   Diastolic CHF, chronic (HCC)   UTI (urinary tract infection)   Acute lower UTI   Acute metabolic encephalopathy   Sepsis ([place])   Early sepsis secondary to urinary tract infection: -Patient presented with confusion, tachypnea, tachycardia, leukocytosis.  Source of infection probably UTI with more than 50 white blood cells in urine. -Agree with continuation of ciprofloxacin,   prior urine culture grew Proteus resistant to ceftriaxone. CT scan showed multiple intrarenal right-sided stones without obstruction. Continue current antibiotics.  Sepsis This is present on admission, temp is 102.1 and heart rate 114. This is secondary to UTI and COPD exacerbation.  Acute metabolic encephalopathy Patient was confused when she came in this is likely secondary to UTI.  Acute on chronic COPD exacerbation: -Patient hypoxic,  requiring 4 L of oxygen. -Chest x-ray negative for pneumonia. -Concern for aspiration, will remain n.p.o. overnight.  A speech and swallow evaluation.  Patient with diagnosis of radial dysphagia and esophageal dysmotility. -Will start Albuterol and ipratropium inhaler -SARS-CoV-2 is negative  Acute on chronic respiratory failure with hypoxia She required 2 L of oxygen at home as needed. Admitted on 4 L of oxygen, this is likely secondary to COPD exacerbation.  Hypothyroidism: Continue with Synthroid.  Diabetes insulin-dependent: Continue with insulin and a sliding scale. Steroid-induced hyperglycemia  Dysphagia, esophageal dysmotility disorder; She was supposed to have endoscopy November 16.   Evaluated by SLP and recommended regular diet output on soft diet. Start IV PPI twice daily.  Frequent fall: High risk for fall.  Will require PT eval.  Anemia Macrocytic  Code Status: DNR Family Communication: Plan discussed with the patient. Disposition Plan: Remains inpatient Diet:  Diet Order            Diet regular Room service appropriate? Yes; Fluid consistency: Thin  Diet effective now              Consultants:  NA  Procedures:  NA  Antibiotics:  CIpro started on admission   Objective: Vitals:   01/27/19 0604 01/27/19 0827  BP: 136/69   Pulse: 79   Resp: 18   Temp: 98 F (36.7 C)   SpO2: 98% 98%    Intake/Output Summary (Last 24 hours) at 01/27/2019 0849 Last data filed at 01/27/2019 [id number] Gross per 24 hour   Intake 270 ml  Output 2050 ml  Net -1780 ml   Filed Weights   01/25/19 1814  Weight: 67.6 kg    Exam: General: Alert and awake, oriented x3, not in any acute distress. HEENT: anicteric sclera, pupils reactive to light and accommodation, EOMI CVS: S1-S2 clear, no murmur rubs or gallops Chest: clear to auscultation bilaterally, no wheezing, rales or rhonchi Abdomen: soft nontender, nondistended, normal bowel sounds, no organomegaly Extremities: no cyanosis, clubbing or edema noted bilaterally Neuro: Cranial nerves II-XII intact, no focal neurological deficits  Data Reviewed: Basic Metabolic Panel: Recent Labs  Lab 01/25/19 1249 01/27/19 0555  NA 135 136  K 3.7 3.6  CL 100 101  CO2 25 23  GLUCOSE 231* 272*  BUN 11 9  CREATININE 0.97 0.83  CALCIUM 8.7* 8.2*  MG  --  2.0   Liver Function Tests: No results for input(s): AST, ALT, ALKPHOS, BILITOT, PROT, ALBUMIN in the last 168 hours. No results for input(s): LIPASE, AMYLASE in the last 168 hours. No results for input(s): AMMONIA in the last 168 hours. CBC: Recent Labs  Lab 01/25/19 1249 01/26/19 0541 01/27/19 0555  WBC 17.4* 14.8* 12.5*  NEUTROABS 12.7*  --   --   HGB 10.7* 10.1* 10.0*  HCT 34.3* 33.4* 31.7*  MCV 103.0* 106.7* 102.6*  PLT 324 274 284   Cardiac Enzymes: No results for input(s): CKTOTAL, CKMB, CKMBINDEX, TROPONINI in the last 168 hours. BNP (last 3 results) No results for input(s): BNP in the last 8760 hours.  ProBNP (last 3 results) No results for input(s): PROBNP in the last 8760 hours.  CBG: Recent Labs  Lab 01/26/19 0742 01/26/19 1127 01/26/19 1616 01/26/19 2245 01/27/19 0803  GLUCAP 162* 109* 279* 282* 291*    Micro Recent Results (from the past 240 hour(s))  SARS CORONAVIRUS 2 (TAT 6-24 HRS) Nasopharyngeal Nasopharyngeal Swab     Status: None   Collection Time: 01/25/19 11:50 AM   Specimen: Nasopharyngeal Swab  Result Value Ref Range Status   SARS Coronavirus 2 NEGATIVE  NEGATIVE Final    Comment: (NOTE) SARS-CoV-2 target nucleic acids are NOT DETECTED. The SARS-CoV-2 RNA is generally detectable in upper and lower respiratory specimens during the acute phase of infection. Negative results do not preclude SARS-CoV-2 infection, do not rule out co-infections with other pathogens, and should not be used as the sole basis for treatment or other patient management decisions. Negative results must be combined with clinical observations, patient history, and epidemiological information. The expected result is Negative. Fact Sheet for Patients: SugarRoll.be Fact Sheet for Healthcare Providers: https://www.woods-mathews.com/ This test is not yet approved or cleared by the [place] FDA and  has been authorized for detection and/or diagnosis of SARS-CoV-2 by FDA under an Emergency Use Authorization (EUA). This EUA will remain  in effect (meaning this test can be used) for the duration of the COVID-19 declaration under Section 56 4(b)(1) of the Act, 21 U.S.C. section 360bbb-3(b)(1), unless the authorization is terminated or revoked sooner. Performed at [facility], [place] [address], [place], [place] [address]   Urine culture     Status: Abnormal (Preliminary result)   Collection Time: 01/25/19  2:57 PM   Specimen: Urine, Random  Result Value  Ref Range Status   Specimen Description   Final    URINE, RANDOM Performed at [facility] [address] [place], [place] [address]    Special Requests   Final    NONE Performed at [facility], [place] [address], [place], [place] [address]    Culture >=100,000 COLONIES/mL GRAM NEGATIVE RODS (A)  Final   Report Status PENDING  Incomplete  Blood culture (routine x 2)     Status: None (Preliminary result)   Collection Time: 01/25/19  3:40 PM   Specimen: BLOOD  Result Value Ref Range Status   Specimen Description   Final    BLOOD  RIGHT ANTECUBITAL Performed at [facility] [address], [place], [place] [address]    Special Requests   Final    BOTTLES DRAWN AEROBIC AND ANAEROBIC Blood Culture adequate volume Performed at [facility] [address] [place], [place] [address]    Culture   Final    NO GROWTH 2 DAYS Performed at [facility] [address], [place], [place] [address]    Report Status PENDING  Incomplete  Blood culture (routine x 2)     Status: None (Preliminary result)   Collection Time: 01/25/19  4:09 PM   Specimen: BLOOD  Result Value Ref Range Status   Specimen Description   Final    BLOOD LEFT ANTECUBITAL Performed at [facility], [place] [address] [place], [place] [address]    Special Requests   Final    BOTTLES DRAWN AEROBIC AND ANAEROBIC Blood Culture adequate volume Performed at [facility] [address] [place], [place] [address]    Culture   Final    NO GROWTH 2 DAYS Performed at [facility] [address], [place], [place] [address]    Report Status PENDING  Incomplete     Studies: Ct Head Wo Contrast  Result Date: 01/26/2019 CLINICAL DATA:  83 year old female status post fall at home four days ago. Continued pain. EXAM: CT HEAD WITHOUT CONTRAST TECHNIQUE: Contiguous axial images were obtained from the base of the skull through the vertex without intravenous contrast. COMPARISON:  Brain MRI 08/02/2014. FINDINGS: Brain: Cerebral volume has not significantly changed since 2016. No midline shift, ventriculomegaly, mass effect, evidence of mass lesion, intracranial hemorrhage or evidence of cortically based acute infarction. Mild to moderate for age bilateral cerebral white matter hypodensity similar to FLAIR hyperintensity in 2016, perhaps mildly progressed. No cortical encephalomalacia. Vascular: Calcified atherosclerosis at the skull base. No suspicious intracranial vascular hyperdensity. Skull:  Intact. Sinuses/Orbits: Visualized paranasal sinuses and mastoids are stable and well pneumatized. Other: No scalp hematoma identified. Postoperative changes to the globes but otherwise negative orbits soft tissues. IMPRESSION: 1. No acute acute intracranial abnormality or recent traumatic injury identified. 2. Mild to moderate for age white matter changes, most commonly due to chronic small vessel disease. Electronically Signed   By: Genevie Ann M.D.   On: 01/26/2019 19:16   Ct Cervical Spine Wo Contrast  Result Date: 01/26/2019 CLINICAL DATA:  83 year old female status post fall at home four days ago. Continued pain. EXAM: CT CERVICAL SPINE WITHOUT CONTRAST TECHNIQUE: Multidetector CT imaging of the cervical spine was performed without intravenous contrast. Multiplanar CT image reconstructions were also generated. COMPARISON:  Head CT today. Cervical spine CT 10/11/2017. FINDINGS: Alignment: Stable since 2019. Degenerative appearing spondylolisthesis at C2-C3, C7-T1, and also in the visible upper thoracic spine. Skull base and  vertebrae: Osteopenia. Visualized skull base is intact. No atlanto-occipital dissociation. No acute osseous abnormality identified. Soft tissues and spinal canal: No prevertebral fluid or swelling. No visible canal hematoma. Calcified carotid atherosclerosis otherwise negative visible noncontrast neck soft tissues. Disc levels: Moderate chronic C1-C2 degeneration greater on the left. Widespread severe disc and endplate degeneration appears stable since 2019. Widespread facet arthropathy, in association with multilevel chronic spondylolisthesis. Upper chest: Visible upper thoracic levels appear stable. Negative lung apices. IMPRESSION: 1. No acute traumatic injury identified in the cervical spine. 2. Widespread cervical spine degeneration appears stable since 2019. Electronically Signed   By: Genevie Ann M.D.   On: 01/26/2019 19:19   Dg Chest Portable 1 View  Result Date: 01/25/2019 CLINICAL  DATA:  83 y.o. Female. Per EMS- pt c/o dysuria, foul smelling urine x 4-5 days. Family reports AMS starting at same time. Pt hx of COPD- 2 breathing tx daily, none taken today. Wears O2 PRN at home. EXAM: PORTABLE CHEST 1 VIEW COMPARISON:  11/07/2017 FINDINGS: Heart size is normal. The lungs are free of focal consolidations and pleural effusions. Minimal RIGHT LOWER lobe atelectasis or scarring. Remote rib fractures. Chronic changes in both shoulders. IMPRESSION: No active disease. Electronically Signed   By: Nolon Nations M.D.   On: 01/25/2019 13:18   Ct Renal Stone Study  Result Date: 01/25/2019 CLINICAL DATA:  Hematuria EXAM: CT ABDOMEN AND PELVIS WITHOUT CONTRAST TECHNIQUE: Multidetector CT imaging of the abdomen and pelvis was performed following the standard protocol without IV contrast. COMPARISON:  None. FINDINGS: Lower chest: Lung bases demonstrate 5 mm right middle lobe pulmonary nodule, series 4, image number 14. Scarring within the right lung base. 5 mm anterior left lung base nodule, series 4, image number 12. There are 2 small 3 mm lingular nodules. Heart size within normal limits. Coronary calcification. Small hiatal hernia Hepatobiliary: Dilated gallbladder with increased density suggesting sludge. No focal hepatic abnormality or biliary dilatation Pancreas: Unremarkable. No pancreatic ductal dilatation or surrounding inflammatory changes. Spleen: Normal in size without focal abnormality. Adrenals/Urinary Tract: Adrenal glands are normal. No hydronephrosis. No ureteral stone. Small stones in the right kidney. Air within the urinary bladder. Mild wall thickening. Stomach/Bowel: The stomach is nonenlarged. No dilated small bowel. Large stools in the colon slightly hyperdense. Negative appendix. Vascular/Lymphatic: Moderate aortic atherosclerosis. No aneurysmal dilatation. No significantly enlarged lymph nodes. Reproductive: Status post hysterectomy. No adnexal masses. Other: Multiple pelvic  sidewall clips.  No free fluid.  No free air. Musculoskeletal: Degenerative changes of the spine. Grade 1 anterolisthesis L5 on S1. Hardware in the right femur with artifact. IMPRESSION: 1. Intrarenal stones on the right without hydronephrosis or ureteral stone. Slight thick-walled appearance of bladder. Moderate air in the bladder, possibly due to recent instrumentation or catheterization. Correlate for this history, if no recent manipulation, could consider gas-forming infection. 2. Distended gallbladder with probable sludge. 3. Multiple pulmonary nodules. No follow-up needed if patient is low-risk (and has no known or suspected primary neoplasm). Non-contrast chest CT can be considered in 12 months if patient is high-risk. This recommendation follows the consensus statement: Guidelines for Management of Incidental Pulmonary Nodules Detected on CT Images: From the Fleischner Society 2017; Radiology 2017; 284:228-243. Electronically Signed   By: Donavan Foil M.D.   On: 01/25/2019 17:00    Scheduled Meds:  albuterol  2.5 mg Nebulization BID   enoxaparin (LOVENOX) injection  40 mg Subcutaneous Q24H   guaiFENesin  1,200 mg Oral BID   insulin aspart  0-9 Units Subcutaneous  TID WC   insulin glargine  5 Units Subcutaneous Q24H   levothyroxine  50 mcg Oral QAC breakfast   methylPREDNISolone (SOLU-MEDROL) injection  40 mg Intravenous Q24H   mometasone-formoterol  2 puff Inhalation BID   montelukast  10 mg Oral QHS   pantoprazole (PROTONIX) IV  40 mg Intravenous Q12H   umeclidinium bromide  1 puff Inhalation Daily   Continuous Infusions:  levofloxacin (LEVAQUIN) IV 750 mg (01/26/19 2045)       Time spent: 35 minutes    [facility] Pager [phone] If 7PM-7AM, please contact night-coverage at [email or web address], password [facility] 01/27/2019, 8:49 AM  LOS: 1 day

## 2019-01-27 NOTE — Progress Notes (Signed)
RT called by RN for pt complaints of small nose bleed from [place] O2. Pt on 2L [place] with SATs of 100%. Pt removed from O2 to assess need while RT stayed at bedside. Pt's SATs dropped to 96% and went back up to 98%. Bubble humidifier placed and O2 at 2L at pt's bedside. RT will leave pt on RA at this time. Pt's baseline is RA at home with 2L [place] PRN for SOB. RN notified and pt explained should she feel the need for O2 that it is still available at bedside.

## 2019-01-27 NOTE — Progress Notes (Signed)
Patient states she takes Elavil at night and wants to be restarted back on it.

## 2019-01-27 NOTE — Progress Notes (Signed)

## 2019-01-28 ENCOUNTER — Other Ambulatory Visit: Payer: Self-pay | Admitting: Internal Medicine

## 2019-01-28 LAB — CBC
HCT: 31 % — ABNORMAL LOW (ref 36.0–46.0)
Hemoglobin: 9.7 g/dL — ABNORMAL LOW (ref 12.0–15.0)
MCH: 32.2 pg (ref 26.0–34.0)
MCHC: 31.3 g/dL (ref 30.0–36.0)
MCV: 103 fL — ABNORMAL HIGH (ref 80.0–100.0)
Platelets: 334 10*3/uL (ref 150–400)
RBC: 3.01 MIL/uL — ABNORMAL LOW (ref 3.87–5.11)
RDW: 12.8 % (ref 11.5–15.5)
WBC: 19 10*3/uL — ABNORMAL HIGH (ref 4.0–10.5)
nRBC: 0 % (ref 0.0–0.2)

## 2019-01-28 LAB — BASIC METABOLIC PANEL
Anion gap: 9 (ref 5–15)
BUN: 17 mg/dL (ref 8–23)
CO2: 24 mmol/L (ref 22–32)
Calcium: 8.8 mg/dL — ABNORMAL LOW (ref 8.9–10.3)
Chloride: 102 mmol/L (ref 98–111)
Creatinine, Ser: 0.87 mg/dL (ref 0.44–1.00)
GFR calc Af Amer: >60 mL/min (ref >60)
GFR calc non Af Amer: >60 mL/min (ref >60)
Glucose, Bld: 379 mg/dL — ABNORMAL HIGH (ref 70–99)
Potassium: 4.5 mmol/L (ref 3.5–5.1)
Sodium: 135 mmol/L (ref 135–145)

## 2019-01-28 LAB — GLUCOSE, CAPILLARY
Glucose-Capillary: 346 mg/dL — ABNORMAL HIGH (ref 70–99)
Glucose-Capillary: 314 mg/dL — ABNORMAL HIGH (ref 70–99)
Glucose-Capillary: 359 mg/dL — ABNORMAL HIGH (ref 70–99)
Glucose-Capillary: 380 mg/dL — ABNORMAL HIGH (ref 70–99)

## 2019-01-28 LAB — MAGNESIUM: Magnesium: 2.1 mg/dL (ref 1.7–2.4)

## 2019-01-28 MED ORDER — GUAIFENESIN-DM 100-10 MG/5ML PO SYRP
10.0000 mL | ORAL_SOLUTION | Freq: Every evening | ORAL | Status: DC | PRN
  Administered 2019-01-28 – 2019-01-29 (×2): 10 mL via ORAL
  Filled 2019-01-28 (×2): qty 10

## 2019-01-28 MED ORDER — INSULIN GLARGINE 100 UNIT/ML ~~LOC~~ SOLN
15.0000 [IU] | Freq: Every day | SUBCUTANEOUS | Status: DC
  Administered 2019-01-28 – 2019-01-30 (×3): 15 [IU] via SUBCUTANEOUS
  Filled 2019-01-28 (×3): qty 0.15

## 2019-01-28 MED ORDER — AMITRIPTYLINE HCL 10 MG PO TABS
10.0000 mg | ORAL_TABLET | Freq: Every day | ORAL | Status: DC
  Administered 2019-01-28 – 2019-01-29 (×2): 10 mg via ORAL
  Filled 2019-01-28 (×3): qty 1

## 2019-01-28 MED ORDER — MAGNESIUM OXIDE 400 (241.3 MG) MG PO TABS
400.0000 mg | ORAL_TABLET | Freq: Every day | ORAL | Status: DC
  Administered 2019-01-28 – 2019-01-30 (×3): 400 mg via ORAL
  Filled 2019-01-28 (×3): qty 1

## 2019-01-28 NOTE — Progress Notes (Signed)
Patient is incessantly coughing. HOB elevated. O2 @ 2L [place] with humidification due to hx of nose bleed. Fluids offered. Fan placed on bedside table. Room temperature lowered per patient request. Patient has complaint of neck pain at 9/10. Tramadol 50 mg PO will be administered when patient has decreased coughing. Medications placed in applesauce to assist with swallowing. Mucinex 1200 mg will be given as ordered. Call bell within reach. Will stay bedside and continue to monitor.

## 2019-01-28 NOTE — Progress Notes (Signed)
Patient's coughing has improved. Oxygen @ 2L [place] will remain in place. HOB remains elevated. Tolerated intake of medication in applesauce well. Call bell within reach. Will continue to monitor.

## 2019-01-28 NOTE — Progress Notes (Signed)
[facility] PROGRESS NOTE   Kayla Thompson [id number] DOB: Dec 26, 1935 DOA: 01/25/2019 PCP: [name], MD   Subjective: Seen with nursing staff at bedside, neck pain improving. Discussed with her the findings of CT scan of the neck, no acute findings. Blood sugar continues to be elevated, reportedly takes 15 units of Lantus at home will restart. Seen by PT and recommended home health PT. Has had cough spell this morning  HPI: Kayla Thompson is a 83 y.o. female medical history significant for diabetes, fibromyalgia, osteoporosis, hypertension, asthma, chronic diastolic heart failure who presents with generalized weakness, confusion and fever found to have UTI.  Patient seen and examined at, daughter is at bedside and help with history.  Patient has been very weak generalized weakness, confused, unable to stand up.  Patient fell the morning of admission and was not able to stand up.  Patient has been having worsening cough and shortness of breath and wheezing for the last 2 days.  Evaluation in the ED: Patient was found to be febrile temperature 102, tachycardia 113, respiration rate 24, leukocytosis 17, hemoglobin 10, UA white blood cell more than 50, moderate hemoglobin.  Assessment/Plan: Active Problems:   Hypertension   HLD (hyperlipidemia)   Hypothyroidism   Personal history of ovarian cancer   Osteoporosis   Fibromyalgia   Hyperparathyroidism ([place])   Asthma, chronic   Type 2 diabetes mellitus with hyperglycemia, with long-term current use of insulin (HCC)   Diastolic CHF, chronic (HCC)   UTI (urinary tract infection)   Acute lower UTI   Acute metabolic encephalopathy   Sepsis ([place])   Early sepsis secondary to urinary tract infection: -Patient presented with confusion, tachypnea, tachycardia, leukocytosis.  Source of infection probably UTI with more than 50 white blood cells in urine. CT scan showed multiple intrarenal right-sided stones without  obstruction. Blood cultures x2 still NGTD.  Sepsis This is present on admission, temp is 102.1 and heart rate 114. This is secondary to UTI and COPD exacerbation.  Acute metabolic encephalopathy Patient was confused when she came in this is likely secondary to UTI. This is resolved, she is awake, alert oriented x3.  UTI Urine cultures positive for Klebsiella, patient is on Rocephin  Acute on chronic COPD exacerbation: -Patient hypoxic,  requiring 4 L of oxygen. -Chest x-ray negative for pneumonia. -Concern for aspiration, will remain n.p.o. overnight.  A speech and swallow evaluation.  Patient with diagnosis of radial dysphagia and esophageal dysmotility. -Will start Albuterol and ipratropium inhaler -SARS-CoV-2 is negative  Acute on chronic respiratory failure with hypoxia She required 2 L of oxygen at home as needed. Admitted on 4 L of oxygen, this is likely secondary to COPD exacerbation.  Hypothyroidism: Continue with Synthroid.  Diabetes insulin-dependent: Continue with insulin and a sliding scale. Steroid-induced hyperglycemia  Dysphagia, esophageal dysmotility disorder; She was supposed to have endoscopy November 16.   Evaluated by SLP and recommended regular diet output on soft diet. PPI twice a day  Frequent fall: High risk for fall.    PT evaluated recommended home health  Anemia Macrocytic anemia, no folate and vitamin B12 deficiency  Code Status: DNR Family Communication: Plan discussed with the patient. Disposition Plan: Remains inpatient Diet:  Diet Order            Diet regular Room service appropriate? Yes; Fluid consistency: Thin  Diet effective now              Consultants:  NA  Procedures:  NA  Antibiotics:  CIpro started on admission   Objective: Vitals:   01/28/19 0549 01/28/19 0832  BP: 132/78   Pulse: 80   Resp: 16   Temp: 97.7 F (36.5 C)   SpO2: 99% 96%    Intake/Output Summary (Last 24 hours) at 01/28/2019  1140 Last data filed at 01/28/2019 0926 Gross per 24 hour  Intake --  Output 1550 ml  Net -1550 ml   Filed Weights   01/25/19 1814  Weight: 67.6 kg    Exam: General: Alert and awake, oriented x3, not in any acute distress. HEENT: anicteric sclera, pupils reactive to light and accommodation, EOMI CVS: S1-S2 clear, no murmur rubs or gallops Chest: clear to auscultation bilaterally, no wheezing, rales or rhonchi Abdomen: soft nontender, nondistended, normal bowel sounds, no organomegaly Extremities: no cyanosis, clubbing or edema noted bilaterally Neuro: Cranial nerves II-XII intact, no focal neurological deficits  Data Reviewed: Basic Metabolic Panel: Recent Labs  Lab 01/25/19 1249 01/27/19 0555 01/28/19 0537  NA 135 136 135  K 3.7 3.6 4.5  CL 100 101 102  CO2 25 23 24   GLUCOSE 231* 272* 379*  BUN 11 9 17   CREATININE 0.97 0.83 0.87  CALCIUM 8.7* 8.2* 8.8*  MG  --  2.0 2.1   Liver Function Tests: No results for input(s): AST, ALT, ALKPHOS, BILITOT, PROT, ALBUMIN in the last 168 hours. No results for input(s): LIPASE, AMYLASE in the last 168 hours. No results for input(s): AMMONIA in the last 168 hours. CBC: Recent Labs  Lab 01/25/19 1249 01/26/19 0541 01/27/19 0555 01/28/19 0537  WBC 17.4* 14.8* 12.5* 19.0*  NEUTROABS 12.7*  --   --   --   HGB 10.7* 10.1* 10.0* 9.7*  HCT 34.3* 33.4* 31.7* 31.0*  MCV 103.0* 106.7* 102.6* 103.0*  PLT 324 274 284 334   Cardiac Enzymes: No results for input(s): CKTOTAL, CKMB, CKMBINDEX, TROPONINI in the last 168 hours. BNP (last 3 results) No results for input(s): BNP in the last 8760 hours.  ProBNP (last 3 results) No results for input(s): PROBNP in the last 8760 hours.  CBG: Recent Labs  Lab 01/27/19 0803 01/27/19 1231 01/27/19 1615 01/27/19 2010 01/28/19 0731  GLUCAP 291* 345* 283* 303* 346*    Micro Recent Results (from the past 240 hour(s))  SARS CORONAVIRUS 2 (TAT 6-24 HRS) Nasopharyngeal Nasopharyngeal Swab      Status: None   Collection Time: 01/25/19 11:50 AM   Specimen: Nasopharyngeal Swab  Result Value Ref Range Status   SARS Coronavirus 2 NEGATIVE NEGATIVE Final    Comment: (NOTE) SARS-CoV-2 target nucleic acids are NOT DETECTED. The SARS-CoV-2 RNA is generally detectable in upper and lower respiratory specimens during the acute phase of infection. Negative results do not preclude SARS-CoV-2 infection, do not rule out co-infections with other pathogens, and should not be used as the sole basis for treatment or other patient management decisions. Negative results must be combined with clinical observations, patient history, and epidemiological information. The expected result is Negative. Fact Sheet for Patients: SugarRoll.be Fact Sheet for Healthcare Providers: https://www.woods-mathews.com/ This test is not yet approved or cleared by the [place] FDA and  has been authorized for detection and/or diagnosis of SARS-CoV-2 by FDA under an Emergency Use Authorization (EUA). This EUA will remain  in effect (meaning this test can be used) for the duration of the COVID-19 declaration under Section 56 4(b)(1) of the Act, 21 U.S.C. section 360bbb-3(b)(1), unless the authorization is terminated or revoked sooner. Performed at [facility],  [address] [place], [place] [address]   Urine culture     Status: Abnormal   Collection Time: 01/25/19  2:57 PM   Specimen: Urine, Random  Result Value Ref Range Status   Specimen Description   Final    URINE, RANDOM Performed at [facility] [address] [place], [place] [address]    Special Requests   Final    NONE Performed at [facility], [place] [address], [place], [place] [address]    Culture >=100,000 COLONIES/mL KLEBSIELLA PNEUMONIAE (A)  Final   Report Status 01/27/2019 FINAL  Final   Organism ID, Bacteria KLEBSIELLA PNEUMONIAE (A)  Final       Susceptibility   Klebsiella pneumoniae - MIC*    AMPICILLIN >=32 RESISTANT Resistant     CEFAZOLIN <=4 SENSITIVE Sensitive     CEFTRIAXONE <=1 SENSITIVE Sensitive     CIPROFLOXACIN <=0.25 SENSITIVE Sensitive     GENTAMICIN <=1 SENSITIVE Sensitive     IMIPENEM <=0.25 SENSITIVE Sensitive     NITROFURANTOIN 64 INTERMEDIATE Intermediate     TRIMETH/SULFA <=20 SENSITIVE Sensitive     AMPICILLIN/SULBACTAM 4 SENSITIVE Sensitive     PIP/TAZO <=4 SENSITIVE Sensitive     Extended ESBL NEGATIVE Sensitive     * >=100,000 COLONIES/mL KLEBSIELLA PNEUMONIAE  Blood culture (routine x 2)     Status: None (Preliminary result)   Collection Time: 01/25/19  3:40 PM   Specimen: BLOOD  Result Value Ref Range Status   Specimen Description   Final    BLOOD RIGHT ANTECUBITAL Performed at [facility] [address], [place], [place] [address]    Special Requests   Final    BOTTLES DRAWN AEROBIC AND ANAEROBIC Blood Culture adequate volume Performed at [facility] [address] [place], [place] [address]    Culture   Final    NO GROWTH 3 DAYS Performed at [facility], [place] [address] [place], [place] [address]    Report Status PENDING  Incomplete  Blood culture (routine x 2)     Status: None (Preliminary result)   Collection Time: 01/25/19  4:09 PM   Specimen: BLOOD  Result Value Ref Range Status   Specimen Description   Final    BLOOD LEFT ANTECUBITAL Performed at [facility], [place] [address], [place], [place] [address]    Special Requests   Final    BOTTLES DRAWN AEROBIC AND ANAEROBIC Blood Culture adequate volume Performed at [facility] [address], [place], [place] [address]    Culture   Final    NO GROWTH 3 DAYS Performed at [facility], [place] [address], [place], [place] [address]    Report Status PENDING  Incomplete     Studies: Ct Head Wo Contrast  Result Date: 01/26/2019 CLINICAL DATA:  83 year old  female status post fall at home four days ago. Continued pain. EXAM: CT HEAD WITHOUT CONTRAST TECHNIQUE: Contiguous axial images were obtained from the base of the skull through the vertex without intravenous contrast. COMPARISON:  Brain MRI 08/02/2014. FINDINGS: Brain: Cerebral volume has not significantly changed since 2016. No midline shift, ventriculomegaly, mass effect, evidence of mass lesion, intracranial hemorrhage or evidence of cortically based acute infarction. Mild to moderate for age bilateral cerebral white matter hypodensity similar to FLAIR hyperintensity in 2016, perhaps mildly progressed. No cortical encephalomalacia. Vascular: Calcified atherosclerosis at the skull base. No suspicious intracranial vascular hyperdensity. Skull: Intact. Sinuses/Orbits: Visualized  paranasal sinuses and mastoids are stable and well pneumatized. Other: No scalp hematoma identified. Postoperative changes to the globes but otherwise negative orbits soft tissues. IMPRESSION: 1. No acute acute intracranial abnormality or recent traumatic injury identified. 2. Mild to moderate for age white matter changes, most commonly due to chronic small vessel disease. Electronically Signed   By: Genevie Ann M.D.   On: 01/26/2019 19:16   Ct Cervical Spine Wo Contrast  Result Date: 01/26/2019 CLINICAL DATA:  83 year old female status post fall at home four days ago. Continued pain. EXAM: CT CERVICAL SPINE WITHOUT CONTRAST TECHNIQUE: Multidetector CT imaging of the cervical spine was performed without intravenous contrast. Multiplanar CT image reconstructions were also generated. COMPARISON:  Head CT today. Cervical spine CT 10/11/2017. FINDINGS: Alignment: Stable since 2019. Degenerative appearing spondylolisthesis at C2-C3, C7-T1, and also in the visible upper thoracic spine. Skull base and vertebrae: Osteopenia. Visualized skull base is intact. No atlanto-occipital dissociation. No acute osseous abnormality identified. Soft tissues and  spinal canal: No prevertebral fluid or swelling. No visible canal hematoma. Calcified carotid atherosclerosis otherwise negative visible noncontrast neck soft tissues. Disc levels: Moderate chronic C1-C2 degeneration greater on the left. Widespread severe disc and endplate degeneration appears stable since 2019. Widespread facet arthropathy, in association with multilevel chronic spondylolisthesis. Upper chest: Visible upper thoracic levels appear stable. Negative lung apices. IMPRESSION: 1. No acute traumatic injury identified in the cervical spine. 2. Widespread cervical spine degeneration appears stable since 2019. Electronically Signed   By: Genevie Ann M.D.   On: 01/26/2019 19:19    Scheduled Meds:  albuterol  2.5 mg Nebulization BID   amitriptyline  10 mg Oral QHS   enoxaparin (LOVENOX) injection  40 mg Subcutaneous Q24H   guaiFENesin  1,200 mg Oral BID   insulin aspart  0-15 Units Subcutaneous TID WC   insulin glargine  15 Units Subcutaneous Daily   levothyroxine  50 mcg Oral QAC breakfast   magnesium oxide  400 mg Oral Daily   mometasone-formoterol  2 puff Inhalation BID   montelukast  10 mg Oral QHS   pantoprazole  40 mg Oral BID   predniSONE  40 mg Oral Q breakfast   umeclidinium bromide  1 puff Inhalation Daily   Continuous Infusions:  cefTRIAXone (ROCEPHIN)  IV 1 g (01/27/19 1225)       Time spent: 35 minutes    Kayla Thompson  [facility] Pager [phone] If 7PM-7AM, please contact night-coverage at [email or web address], password [facility] 01/28/2019, 11:40 AM  LOS: 2 days

## 2019-01-29 ENCOUNTER — Other Ambulatory Visit: Payer: Self-pay | Admitting: Internal Medicine

## 2019-01-29 DIAGNOSIS — E785 Hyperlipidemia, unspecified: Secondary | ICD-10-CM

## 2019-01-29 DIAGNOSIS — I10 Essential (primary) hypertension: Secondary | ICD-10-CM

## 2019-01-29 LAB — BASIC METABOLIC PANEL
Anion gap: 8 (ref 5–15)
BUN: 21 mg/dL (ref 8–23)
CO2: 26 mmol/L (ref 22–32)
Calcium: 8.5 mg/dL — ABNORMAL LOW (ref 8.9–10.3)
Chloride: 103 mmol/L (ref 98–111)
Creatinine, Ser: 1.1 mg/dL — ABNORMAL HIGH (ref 0.44–1.00)
GFR calc Af Amer: 55 mL/min — ABNORMAL LOW (ref >60)
GFR calc non Af Amer: 47 mL/min — ABNORMAL LOW (ref >60)
Glucose, Bld: 288 mg/dL — ABNORMAL HIGH (ref 70–99)
Potassium: 3.6 mmol/L (ref 3.5–5.1)
Sodium: 137 mmol/L (ref 135–145)

## 2019-01-29 LAB — GLUCOSE, CAPILLARY
Glucose-Capillary: 222 mg/dL — ABNORMAL HIGH (ref 70–99)
Glucose-Capillary: 182 mg/dL — ABNORMAL HIGH (ref 70–99)
Glucose-Capillary: 506 mg/dL (ref 70–99)
Glucose-Capillary: 459 mg/dL — ABNORMAL HIGH (ref 70–99)
Glucose-Capillary: 140 mg/dL — ABNORMAL HIGH (ref 70–99)

## 2019-01-29 MED ORDER — INSULIN ASPART 100 UNIT/ML ~~LOC~~ SOLN
3.0000 [IU] | Freq: Three times a day (TID) | SUBCUTANEOUS | Status: DC
  Administered 2019-01-29 – 2019-01-30 (×4): 3 [IU] via SUBCUTANEOUS

## 2019-01-29 MED ORDER — PREDNISONE 20 MG PO TABS: 20.0000 mg | ORAL_TABLET | Freq: Every day | ORAL | Status: DC

## 2019-01-29 MED ORDER — INSULIN ASPART 100 UNIT/ML ~~LOC~~ SOLN
10.0000 [IU] | Freq: Once | SUBCUTANEOUS | Status: AC
  Administered 2019-01-29: 10 [IU] via SUBCUTANEOUS

## 2019-01-29 NOTE — Progress Notes (Signed)
PROGRESS NOTE  Kayla Thompson [id number] DOB: 10/12/1935 DOA: 01/25/2019 PCP: [name], MD   LOS: 3 days   Brief Narrative / Interim history: Kayla Thompson a 83 y.o.femalemedical history significant for diabetes, fibromyalgia, osteoporosis, hypertension, asthma, chronic diastolic heart failure who presents with generalized weakness, confusion and fever found to have UTI. She has also had several falls at home  Subjective / 24h Interval events: Doing well this morning, feeling a bit stronger.  Denies any chest pain, no abdominal pain, no nausea or vomiting  Assessment & Plan: Active Problems:   Hypertension   HLD (hyperlipidemia)   Hypothyroidism   Personal history of ovarian cancer   Osteoporosis   Fibromyalgia   Hyperparathyroidism ([place])   Asthma, chronic   Type 2 diabetes mellitus with hyperglycemia, with long-term current use of insulin (HCC)   Diastolic CHF, chronic (HCC)   UTI (urinary tract infection)   Acute lower UTI   Acute metabolic encephalopathy   Sepsis ([place])   Principal Problem Acute metabolic encephalopathy in the setting of sepsis due to UTI -Patient admitted to the hospital with confusion, tachypnea, tachycardia, leukocytosis -She had a UTI with cultures growing Klebsiella, has been placed on ceftriaxone -She was more clear but I sense still very faint confusion, continue IV antibiotics for 1 more day  Active Problems Acute on chronic COPD exacerbation/acute on chronic hypoxia -Chest x-ray negative for pneumonia on admission, continue inhalers, she was started on steroids, no longer wheezing and given hyperglycemia will decrease steroids dose starting tomorrow  Hypothyroidism -Continue Synthroid  Insulin-dependent diabetes -With hyperglycemia, continue insulin, continue steroids at a lower dose.  Add mealtime insulin  Dysphagia, esophageal dysmotility disorder -She is supposed to have an endoscopy November 16  Frequent falls  -PT recommended home health  Microcytic anemia  -likely of chronic disease  Scheduled Meds: . albuterol  2.5 mg Nebulization BID  . amitriptyline  10 mg Oral QHS  . enoxaparin (LOVENOX) injection  40 mg Subcutaneous Q24H  . guaiFENesin  1,200 mg Oral BID  . insulin aspart  0-15 Units Subcutaneous TID WC  . insulin aspart  3 Units Subcutaneous TID WC  . insulin glargine  15 Units Subcutaneous Daily  . levothyroxine  50 mcg Oral QAC breakfast  . magnesium oxide  400 mg Oral Daily  . mometasone-formoterol  2 puff Inhalation BID  . montelukast  10 mg Oral QHS  . pantoprazole  40 mg Oral BID  . [START ON 01/30/2019] predniSONE  20 mg Oral Q breakfast  . umeclidinium bromide  1 puff Inhalation Daily   Continuous Infusions: . cefTRIAXone (ROCEPHIN)  IV Stopped (01/28/19 1412)   PRN Meds:.acetaminophen **OR** acetaminophen, guaiFENesin-dextromethorphan, morphine injection, ondansetron **OR** ondansetron (ZOFRAN) IV, traMADol  DVT prophylaxis: Lovenox Code Status: Full code Family Communication: d/w patient, called and updated daughter  Disposition Plan: home when ready   Consultants:  None   Procedures:  none  Microbiology  Urine culture - Klebsiella  Antimicrobials: Ceftriaxone    Objective: Vitals:   01/28/19 2042 01/29/19 0536 01/29/19 1015 01/29/19 1134  BP:  129/75    Pulse:  79    Resp:  18    Temp:  98 F (36.7 C)    TempSrc:  Oral    SpO2: 97% 99% 95% 95%  Weight:      Height:        Intake/Output Summary (Last 24 hours) at 01/29/2019 1243 Last data filed at 01/29/2019 0919 Gross per 24 hour  Intake  200 ml  Output 2550 ml  Net -2350 ml   Filed Weights   01/25/19 1814  Weight: 67.6 kg    Examination:  Constitutional: NAD Eyes: PERRL, lids and conjunctivae normal ENMT: Mucous membranes are moist.  Respiratory: clear to auscultation bilaterally, no wheezing, no crackles. Normal respiratory effort.  Cardiovascular: Regular rate and rhythm, no  murmurs / rubs / gallops. No LE edema.  Abdomen: no tenderness. Bowel sounds positive.  Musculoskeletal: no clubbing / cyanosis. Skin: no rashes, lesions, ulcers. No induration Neurologic: CN 2-12 grossly intact. Strength 5/5 in all 4.  Psychiatric: Normal judgment and insight. Alert and oriented x 3.   Data Reviewed: I have independently reviewed following labs and imaging studies   CBC: Recent Labs  Lab 01/25/19 1249 01/26/19 0541 01/27/19 0555 01/28/19 0537  WBC 17.4* 14.8* 12.5* 19.0*  NEUTROABS 12.7*  --   --   --   HGB 10.7* 10.1* 10.0* 9.7*  HCT 34.3* 33.4* 31.7* 31.0*  MCV 103.0* 106.7* 102.6* 103.0*  PLT 324 274 284 [id number]   Basic Metabolic Panel: Recent Labs  Lab 01/25/19 1249 01/27/19 0555 01/28/19 0537 01/29/19 0545  NA 135 136 135 137  K 3.7 3.6 4.5 3.6  CL 100 101 102 103  CO2 25 23 24 26   GLUCOSE 231* 272* 379* 288*  BUN 11 9 17 21   CREATININE 0.97 0.83 0.87 1.10*  CALCIUM 8.7* 8.2* 8.8* 8.5*  MG  --  2.0 2.1  --    GFR: Estimated Creatinine Clearance: 33.6 mL/min (A) (by C-G formula based on SCr of 1.1 mg/dL (H)). Liver Function Tests: No results for input(s): AST, ALT, ALKPHOS, BILITOT, PROT, ALBUMIN in the last 168 hours. No results for input(s): LIPASE, AMYLASE in the last 168 hours. No results for input(s): AMMONIA in the last 168 hours. Coagulation Profile: No results for input(s): INR, PROTIME in the last 168 hours. Cardiac Enzymes: No results for input(s): CKTOTAL, CKMB, CKMBINDEX, TROPONINI in the last 168 hours. BNP (last 3 results) No results for input(s): PROBNP in the last 8760 hours. HbA1C: No results for input(s): HGBA1C in the last 72 hours. CBG: Recent Labs  Lab 01/28/19 1216 01/28/19 1629 01/28/19 2013 01/29/19 0751 01/29/19 1208  GLUCAP 314* 359* 380* 222* 182*   Lipid Profile: No results for input(s): CHOL, HDL, LDLCALC, TRIG, CHOLHDL, LDLDIRECT in the last 72 hours. Thyroid Function Tests: Recent Labs    01/27/19  0555  TSH 0.110*   Anemia Panel: No results for input(s): VITAMINB12, FOLATE, FERRITIN, TIBC, IRON, RETICCTPCT in the last 72 hours. Urine analysis:    Component Value Date/Time   COLORURINE YELLOW 01/25/2019 1457   APPEARANCEUR CLOUDY (A) 01/25/2019 1457   LABSPEC 1.012 01/25/2019 1457   PHURINE 5.0 01/25/2019 1457   GLUCOSEU NEGATIVE 01/25/2019 1457   HGBUR MODERATE (A) 01/25/2019 1457   BILIRUBINUR NEGATIVE 01/25/2019 1457   KETONESUR 20 (A) 01/25/2019 1457   PROTEINUR 100 (A) 01/25/2019 1457   UROBILINOGEN 0.2 08/02/2014 0231   NITRITE NEGATIVE 01/25/2019 1457   LEUKOCYTESUR LARGE (A) 01/25/2019 1457   Sepsis Labs: Invalid input(s): PROCALCITONIN, LACTICIDVEN  Recent Results (from the past 240 hour(s))  SARS CORONAVIRUS 2 (TAT 6-24 HRS) Nasopharyngeal Nasopharyngeal Swab     Status: None   Collection Time: 01/25/19 11:50 AM   Specimen: Nasopharyngeal Swab  Result Value Ref Range Status   SARS Coronavirus 2 NEGATIVE NEGATIVE Final    Comment: (NOTE) SARS-CoV-2 target nucleic acids are NOT DETECTED. The SARS-CoV-2 RNA is generally  detectable in upper and lower respiratory specimens during the acute phase of infection. Negative results do not preclude SARS-CoV-2 infection, do not rule out co-infections with other pathogens, and should not be used as the sole basis for treatment or other patient management decisions. Negative results must be combined with clinical observations, patient history, and epidemiological information. The expected result is Negative. Fact Sheet for Patients: SugarRoll.be Fact Sheet for Healthcare Providers: https://www.woods-mathews.com/ This test is not yet approved or cleared by the [place] FDA and  has been authorized for detection and/or diagnosis of SARS-CoV-2 by FDA under an Emergency Use Authorization (EUA). This EUA will remain  in effect (meaning this test can be used) for the duration of  the COVID-19 declaration under Section 56 4(b)(1) of the Act, 21 U.S.C. section 360bbb-3(b)(1), unless the authorization is terminated or revoked sooner. Performed at [facility], [place] [address], [place], [place] [address]   Urine culture     Status: Abnormal   Collection Time: 01/25/19  2:57 PM   Specimen: Urine, Random  Result Value Ref Range Status   Specimen Description   Final    URINE, RANDOM Performed at [facility] [address], [place], [place] [address]    Special Requests   Final    NONE Performed at [facility], [place] [address] [place] [address] [place], [place] [address]    Culture >=100,000 COLONIES/mL KLEBSIELLA PNEUMONIAE (A)  Final   Report Status 01/27/2019 FINAL  Final   Organism ID, Bacteria KLEBSIELLA PNEUMONIAE (A)  Final      Susceptibility   Klebsiella pneumoniae - MIC*    AMPICILLIN >=32 RESISTANT Resistant     CEFAZOLIN <=4 SENSITIVE Sensitive     CEFTRIAXONE <=1 SENSITIVE Sensitive     CIPROFLOXACIN <=0.25 SENSITIVE Sensitive     GENTAMICIN <=1 SENSITIVE Sensitive     IMIPENEM <=0.25 SENSITIVE Sensitive     NITROFURANTOIN 64 INTERMEDIATE Intermediate     TRIMETH/SULFA <=20 SENSITIVE Sensitive     AMPICILLIN/SULBACTAM 4 SENSITIVE Sensitive     PIP/TAZO <=4 SENSITIVE Sensitive     Extended ESBL NEGATIVE Sensitive     * >=100,000 COLONIES/mL KLEBSIELLA PNEUMONIAE  Blood culture (routine x 2)     Status: None (Preliminary result)   Collection Time: 01/25/19  3:40 PM   Specimen: BLOOD  Result Value Ref Range Status   Specimen Description   Final    BLOOD RIGHT ANTECUBITAL Performed at [facility] [address] [place], [place] [address]    Special Requests   Final    BOTTLES DRAWN AEROBIC AND ANAEROBIC Blood Culture adequate volume Performed at [facility] [address], [place], [place] [address]    Culture   Final    NO GROWTH 4 DAYS Performed at [facility], [place] [address] [place], [place] [address]    Report Status PENDING  Incomplete  Blood culture (routine x 2)     Status: None (Preliminary result)   Collection Time: 01/25/19  4:09 PM   Specimen: BLOOD  Result Value Ref Range Status   Specimen Description   Final    BLOOD LEFT ANTECUBITAL Performed at [facility], [place] [address] [place], [place] [address]    Special Requests   Final    BOTTLES DRAWN AEROBIC AND ANAEROBIC Blood Culture adequate volume Performed at [facility] [address] [place], [place] [address]    Culture   Final  NO GROWTH 4 DAYS Performed at [facility], [place] [address], [place], [place] [address]    Report Status PENDING  Incomplete      Radiology Studies: No results found.   Marzetta Board, MD, PhD [facility]  Contact via  [email or web address]  [place] P: [phone] F: [phone]

## 2019-01-29 NOTE — Progress Notes (Signed)
Pt CBG 506. MD paged. RN received called from MD. Advised to administer max sliding scale (15 units plus 3 units as scheduled). Will recheck CBG within 1-2 hrs per MD.

## 2019-01-29 NOTE — TOC Transition Note (Signed)
Transition of Care [facility]) - CM/SW Discharge Note   Patient Details  Name: DANY WILHELM MRN: [id number] Date of Birth: [birth year]  [facility]) CM/SW Contact:  Trish Mage, LCSW Phone Number: 01/29/2019, 11:09 AM   Clinical Narrative:   Spoke with daughter [name] this AM about recommendation of 24 hour care/supervision.  She would prefer her mother come home due to [place].  Her sister is checking with insurance today to see if her supplement covers in home care.  In the meantime, the plan is for her mother to return to her home, resume with the aide 5 days a week, and [name] will then be in the home all hours that the aide is not present.  [name] asked that I refer her mother to [facility] for services as they have used that agency in the past.  Referral complete     Final next level of care: Home w Home Health Services Barriers to Discharge: SNF Pending bed offer   Patient Goals and CMS Choice   CMS Medicare.gov Compare Post Acute Care list provided to:: Patient Choice offered to / list presented to : Patient, Adult Children  Discharge Placement                       Discharge Plan and Services   Discharge Planning Services: CM Consult Post Acute Care Choice: Skilled Nursing Facility                    HH Arranged: PT, Nurse's Aide [facility] Agency: [place] Date [place]: 01/29/19 Time HH Agency Contacted: [id number] Representative spoke with at [place]: [place] Determinants of Health ([place]) Interventions     Readmission Risk Interventions No flowsheet data found.

## 2019-01-29 NOTE — Progress Notes (Signed)
Physical Therapy Treatment Patient Details Name: Kayla Thompson MRN: [id number] DOB: [birth year] Today's Date: 01/29/2019    History of Present Illness 83 yo female admitted to ED on 11/5 with UTI sepsis, fall OOB. Pt with neck pain since fall, CT head and neck negative for acute findings. PMH includes OA, DM, fibromyalgia, GERD, HTN, osteoporosis, COPD on supplemental O2 PRN    PT Comments    Pt is progressing with mobility. Pt expressed desire to d/c home with family. Pt is currently deconditioned requiring increased time and rest breaks secondary to SOB. Pt is min assist with basic mobility with RW ambulating 21ft. Pt will need 24 hour assistance with mobility. If family unable to provide assistance recommend SNF. Pt will continue to benefit from continued acute skilled PT to maximize mobility and Independence for d/c.  Follow Up Recommendations  Home health PT;SNF;Supervision/Assistance - 24 hour     Equipment Recommendations  None recommended by PT    Recommendations for Other Services       Precautions / Restrictions Precautions Precautions: Fall Restrictions Other Position/Activity Restrictions: bilateral shoulder creptius noted with gait, limited ROM    Mobility  Bed Mobility Overal bed mobility: Needs Assistance Bed Mobility: Supine to Sit     Supine to sit: Min guard     General bed mobility comments: use of bed rails and HOB elevated, rest breaks between transition movements to decreased SOB  Transfers Overall transfer level: Needs assistance Equipment used: Rolling walker (2 wheeled) Transfers: Sit to/from Stand Sit to Stand: Min assist;From elevated surface         General transfer comment: cues for hand placement and increasing forward trunk flexion to facilitate increased independence with transfers  Ambulation/Gait Ambulation/Gait assistance: Min guard Gait Distance (Feet): 55 Feet Assistive device: Rolling walker (2 wheeled) Gait  Pattern/deviations: Step-through pattern;Decreased stride length Gait velocity: decreased   General Gait Details: sats 97% after gait on RA, but pt declined additional walk due to fatigue.   Stairs             Wheelchair Mobility    Modified Rankin (Stroke Patients Only)       Balance Overall balance assessment: Needs assistance Sitting-balance support: No upper extremity supported Sitting balance-Leahy Scale: Good     Standing balance support: Bilateral upper extremity supported Standing balance-Leahy Scale: Fair                              Cognition Arousal/Alertness: Awake/alert Behavior During Therapy: WFL for tasks assessed/performed Overall Cognitive Status: Within Functional Limits for tasks assessed                                        Exercises General Exercises - Lower Extremity Ankle Circles/Pumps: AROM;Strengthening;Both;Seated Long Arc Quad: AROM;Strengthening;Both;10 reps;Seated Heel Slides: AROM;Strengthening;Both;10 reps;Seated Hip ABduction/ADduction: AROM;Strengthening;Both;10 reps;Seated Hip Flexion/Marching: AROM;Strengthening;Both;10 reps;Seated    General Comments        Pertinent Vitals/Pain Pain Assessment: Faces Faces Pain Scale: Hurts a little bit Pain Location: shoulders Pain Descriptors / Indicators: Discomfort Pain Intervention(s): Limited activity within patient's tolerance;Monitored during session    Home Living                      Prior Function            PT Goals (current goals can now  be found in the care plan section) Progress towards PT goals: Progressing toward goals    Frequency    Min 3X/week      PT Plan Current plan remains appropriate    Co-evaluation              AM-PAC PT "6 Clicks" Mobility   Outcome Measure  Help needed turning from your back to your side while in a flat bed without using bedrails?: A Little Help needed moving from lying on  your back to sitting on the side of a flat bed without using bedrails?: A Little Help needed moving to and from a bed to a chair (including a wheelchair)?: A Little Help needed standing up from a chair using your arms (e.g., wheelchair or bedside chair)?: A Little Help needed to walk in hospital room?: A Little Help needed climbing 3-5 steps with a railing? : A Lot 6 Click Score: 17    End of Session   Activity Tolerance: Patient limited by fatigue Patient left: in chair;with call bell/phone within reach;with chair alarm set Nurse Communication: Mobility status PT Visit Diagnosis: Difficulty in walking, not elsewhere classified (R26.2);Muscle weakness (generalized) (M62.81);Repeated falls (R29.6)     Time: [id number] PT Time Calculation (min) (ACUTE ONLY): 33 min  Charges:  $Gait Training: 8-22 mins $Therapeutic Exercise: 8-22 mins                      Lelon Mast 01/29/2019, 10:25 AM

## 2019-01-29 NOTE — Care Management Important Message (Signed)
Important Message  Patient Details IM Letter given to [facility] SW to present to the Patient Name: Kayla Thompson MRN: [id number] Date of Birth: 08/15/1935   Medicare Important Message Given:  Yes     Kerin Salen 01/29/2019, 11:11 AM

## 2019-01-30 LAB — CULTURE, BLOOD (ROUTINE X 2)
Culture: NO GROWTH
Culture: NO GROWTH
Special Requests: ADEQUATE
Special Requests: ADEQUATE

## 2019-01-30 LAB — GLUCOSE, CAPILLARY
Glucose-Capillary: 99 mg/dL (ref 70–99)
Glucose-Capillary: 144 mg/dL — ABNORMAL HIGH (ref 70–99)

## 2019-01-30 LAB — BASIC METABOLIC PANEL
Anion gap: 11 (ref 5–15)
BUN: 16 mg/dL (ref 8–23)
CO2: 27 mmol/L (ref 22–32)
Calcium: 8.5 mg/dL — ABNORMAL LOW (ref 8.9–10.3)
Chloride: 102 mmol/L (ref 98–111)
Creatinine, Ser: 0.87 mg/dL (ref 0.44–1.00)
GFR calc Af Amer: >60 mL/min (ref >60)
GFR calc non Af Amer: >60 mL/min (ref >60)
Glucose, Bld: 117 mg/dL — ABNORMAL HIGH (ref 70–99)
Potassium: 3.5 mmol/L (ref 3.5–5.1)
Sodium: 140 mmol/L (ref 135–145)

## 2019-01-30 MED ORDER — TRAMADOL HCL 50 MG PO TABS: 50.0000 mg | ORAL_TABLET | Freq: Every evening | ORAL | 0 refills | Status: DC | PRN

## 2019-01-30 MED ORDER — PREDNISONE 10 MG PO TABS
10.0000 mg | ORAL_TABLET | Freq: Every day | ORAL | Status: DC
  Administered 2019-01-30: 10 mg via ORAL
  Filled 2019-01-30: qty 1

## 2019-01-30 MED ORDER — PREDNISONE 10 MG PO TABS: 10.0000 mg | ORAL_TABLET | Freq: Every day | ORAL | 0 refills | Status: AC

## 2019-01-30 MED ORDER — DULOXETINE HCL 60 MG PO CPEP: 60.0000 mg | ORAL_CAPSULE | Freq: Every day | ORAL | 3 refills | Status: DC

## 2019-01-30 MED ORDER — CIPROFLOXACIN HCL 500 MG PO TABS: 500.0000 mg | ORAL_TABLET | Freq: Two times a day (BID) | ORAL | 0 refills | Status: AC

## 2019-01-30 NOTE — Discharge Summary (Signed)
Physician Discharge Summary  Kayla Thompson [id number] DOB: 06/14/1935 DOA: 01/25/2019  PCP: Kayla Koch, MD  Admit date: 01/25/2019 Discharge date: 01/30/2019  Admitted From: home Disposition:  home  Recommendations for Outpatient Follow-up:  1. Follow up with PCP in 1-2 weeks  Home Health: PT Equipment/Devices: none   Discharge Condition: stable CODE STATUS: DNR Diet recommendation: diabetic  HPI: Per admitting MD, Kayla Thompson is a 83 y.o. female medical history significant for diabetes, fibromyalgia, osteoporosis, hypertension, asthma, chronic diastolic heart failure who presents with generalized weakness, confusion and fever found to have UTI.  Patient seen and examined at, daughter is at bedside and help with history.  Patient has been very weak generalized weakness, confused, unable to stand up.  Patient fell the morning of admission and was not able to stand up.  Patient has been having worsening cough and shortness of breath and wheezing for the last 2 days. Daughter reports a history of dysphagia, patient choke on liquids and fluid.  She is supposed to have an endoscopy, by [facility] on November 16.  Had an esophagogram that showed nonspecific esophageal dysmotility disorder with considerable tertiary contractions distally to the esophagus.  Mild distal esophageal fold thickening may reflect esophagitis.  Chest x-ray no active disease. Evaluation in the ED: Patient was found to be febrile temperature 102, tachycardia 113, respiration rate 24, leukocytosis 17, hemoglobin 10, UA white blood cell more than 50, moderate hemoglobin.  Hospital Course: Principal Problem Acute metabolic encephalopathy in the setting of sepsis due to UTI -Patient admitted to the hospital with confusion, tachypnea, tachycardia, leukocytosis. She had a UTI with cultures growing Klebsiella, has been placed on ceftriaxone, improved, returned to baseline, she is allergic to penicillin and  was transitioned to Ciprofloxacin on dc for 4 more days.   Active Problems Acute on chronic COPD exacerbation/acute on chronic hypoxia -Chest x-ray negative for pneumonia on admission, continue inhalers, she was started on steroids, no longer wheezing and given hyperglycemia will decrease steroids dose, to be discharged on a quick taper of 10 mg Prednisone for 3 more days  Hypothyroidism -Continue Synthroid Insulin-dependent diabetes -With hyperglycemia, improved with lower prednisone dose Dysphagia, esophageal dysmotility disorder -She is supposed to have an endoscopy November 16 Frequent falls -PT recommended home health Microcytic anemia  -likely of chronic disease  Discharge Diagnoses:  Active Problems:   Hypertension   HLD (hyperlipidemia)   Hypothyroidism   Personal history of ovarian cancer   Osteoporosis   Fibromyalgia   Hyperparathyroidism ([place])   Asthma, chronic   Type 2 diabetes mellitus with hyperglycemia, with long-term current use of insulin (HCC)   Diastolic CHF, chronic (HCC)   UTI (urinary tract infection)   Acute lower UTI   Acute metabolic encephalopathy   Sepsis ([place])  Discharge Instructions  Allergies as of 01/30/2019      Reactions   Latex Rash   Penicillins Rash   Has patient had a PCN reaction causing immediate rash, facial/tongue/throat swelling, SOB or lightheadedness with hypotension: No Has patient had a PCN reaction causing severe rash involving mucus membranes or skin necrosis: No Has patient had a PCN reaction that required hospitalization: No Has patient had a PCN reaction occurring within the last 10 years: No If all of the above answers are "NO", then may proceed with Cephalosporin use.      Medication List    STOP taking these medications   fluticasone 50 MCG/ACT nasal spray Commonly known as: FLONASE  TAKE these medications   albuterol 108 (90 Base) MCG/ACT inhaler Commonly known as: VENTOLIN HFA Inhale 2 puffs into the lungs  every 6 (six) hours as needed for wheezing or shortness of breath.   amitriptyline 10 MG tablet Commonly known as: ELAVIL Take 1 tablet (10 mg total) by mouth at bedtime.   baclofen 10 MG tablet Commonly known as: LIORESAL TAKE 0.5-1 TABLET BY MOUTH TWICE DAILY AS NEEDED FOR MUSCLE SPASMS OR PAIN What changed:   how much to take  how to take this  when to take this  additional instructions   BD Pen Needle Nano U/F 32G X 4 MM Misc Generic drug: Insulin Pen Needle USE 4 TIMES A DAY   busPIRone 5 MG tablet Commonly known as: BUSPAR TAKE 1 TO 2 TABLETS BY MOUTH TWICE A DAY What changed: See the new instructions.   calcium carbonate 1500 (600 Ca) MG Tabs tablet Commonly known as: OSCAL Take 1,500 mg by mouth 2 (two) times daily with a meal.   carvedilol 3.125 MG tablet Commonly known as: COREG Take 3.125 mg by mouth 2 (two) times daily with a meal. What changed: Another medication with the same name was removed. Continue taking this medication, and follow the directions you see here.   CENTRUM ADULTS PO Take 1 tablet by mouth daily.   ciprofloxacin 500 MG tablet Commonly known as: Cipro Take 1 tablet (500 mg total) by mouth 2 (two) times daily for 4 days.   DULoxetine 30 MG capsule Commonly known as: CYMBALTA TAKE 1 CAPSULE BY MOUTH EVERY DAY What changed: how much to take   Fluticasone-Salmeterol 500-50 MCG/DOSE Aepb Commonly known as: Advair Diskus Inhale 1 puff into the lungs 2 (two) times a day. Rinse mouth   freestyle lancets Use to test blood sugar 3 times daily. Dx: E11.65   glucose blood test strip Commonly known as: FREESTYLE TEST STRIPS Use to test blood sugar 3 times daily. Dx: E11.65   Insulin Glargine 100 UNIT/ML Solostar Pen Commonly known as: Lantus SoloStar Inject 20 Units into the skin every morning. What changed: how much to take   levothyroxine 50 MCG tablet Commonly known as: SYNTHROID TAKE 1 TABLET BY MOUTH EVERY DAY BEFORE  BREAKFAST What changed: See the new instructions.   magnesium oxide 400 MG tablet Commonly known as: MAG-OX TAKE 1 TABLET BY MOUTH EVERY DAY   montelukast 10 MG tablet Commonly known as: SINGULAIR TAKE 1 TABLET BY MOUTH EVERY DAY What changed:   how much to take  how to take this  when to take this   Mucinex DM Maximum Strength 60-1200 MG Tb12 Take 1 tablet by mouth 2 (two) times daily. Reported on 06/23/2015   omeprazole 20 MG capsule Commonly known as: PRILOSEC Take 20 mg by mouth daily.   predniSONE 1 MG tablet Commonly known as: DELTASONE Take 2 tablets (2 mg total) by mouth daily. What changed: Another medication with the same name was added. Make sure you understand how and when to take each.   predniSONE 10 MG tablet Commonly known as: DELTASONE Take 1 tablet (10 mg total) by mouth daily with breakfast for 3 days. Start taking on: January 31, 2019 What changed: You were already taking a medication with the same name, and this prescription was added. Make sure you understand how and when to take each.   rosuvastatin 10 MG tablet Commonly known as: CRESTOR TAKE 1 TABLET (10 MG TOTAL) BY MOUTH EVERY EVENING.   Spiriva HandiHaler 18  MCG inhalation capsule Generic drug: tiotropium PLACE 1 CAPSULE INTO INHALER AND INHALE AS DIRECTED EVERY DAY   SYSTANE FREE OP Apply 1 drop to eye 3 (three) times daily.   traMADol 50 MG tablet Commonly known as: ULTRAM Take 1 tablet (50 mg total) by mouth at bedtime as needed for moderate pain.   UNABLE TO FIND Take 1,000 mg by mouth daily. Med Name: Tumeric   UNABLE TO FIND Take 1,000 mg by mouth daily. Med Name: [place]   vitamin B-12 250 MCG tablet Commonly known as: CYANOCOBALAMIN Take 250 mcg by mouth See admin instructions. Twice weekly   Vitamin D3 50 MCG (2000 UT) Tabs Take 2 tablets by mouth daily.      Consultations:  None   Procedures/Studies:  Ct Head Wo Contrast  Result Date:  01/26/2019 CLINICAL DATA:  83 year old female status post fall at home four days ago. Continued pain. EXAM: CT HEAD WITHOUT CONTRAST TECHNIQUE: Contiguous axial images were obtained from the base of the skull through the vertex without intravenous contrast. COMPARISON:  Brain MRI 08/02/2014. FINDINGS: Brain: Cerebral volume has not significantly changed since 2016. No midline shift, ventriculomegaly, mass effect, evidence of mass lesion, intracranial hemorrhage or evidence of cortically based acute infarction. Mild to moderate for age bilateral cerebral white matter hypodensity similar to FLAIR hyperintensity in 2016, perhaps mildly progressed. No cortical encephalomalacia. Vascular: Calcified atherosclerosis at the skull base. No suspicious intracranial vascular hyperdensity. Skull: Intact. Sinuses/Orbits: Visualized paranasal sinuses and mastoids are stable and well pneumatized. Other: No scalp hematoma identified. Postoperative changes to the globes but otherwise negative orbits soft tissues. IMPRESSION: 1. No acute acute intracranial abnormality or recent traumatic injury identified. 2. Mild to moderate for age white matter changes, most commonly due to chronic small vessel disease. Electronically Signed   By: Genevie Ann M.D.   On: 01/26/2019 19:16   Ct Cervical Spine Wo Contrast  Result Date: 01/26/2019 CLINICAL DATA:  83 year old female status post fall at home four days ago. Continued pain. EXAM: CT CERVICAL SPINE WITHOUT CONTRAST TECHNIQUE: Multidetector CT imaging of the cervical spine was performed without intravenous contrast. Multiplanar CT image reconstructions were also generated. COMPARISON:  Head CT today. Cervical spine CT 10/11/2017. FINDINGS: Alignment: Stable since 2019. Degenerative appearing spondylolisthesis at C2-C3, C7-T1, and also in the visible upper thoracic spine. Skull base and vertebrae: Osteopenia. Visualized skull base is intact. No atlanto-occipital dissociation. No acute osseous  abnormality identified. Soft tissues and spinal canal: No prevertebral fluid or swelling. No visible canal hematoma. Calcified carotid atherosclerosis otherwise negative visible noncontrast neck soft tissues. Disc levels: Moderate chronic C1-C2 degeneration greater on the left. Widespread severe disc and endplate degeneration appears stable since 2019. Widespread facet arthropathy, in association with multilevel chronic spondylolisthesis. Upper chest: Visible upper thoracic levels appear stable. Negative lung apices. IMPRESSION: 1. No acute traumatic injury identified in the cervical spine. 2. Widespread cervical spine degeneration appears stable since 2019. Electronically Signed   By: Genevie Ann M.D.   On: 01/26/2019 19:19   Dg Chest Portable 1 View  Result Date: 01/25/2019 CLINICAL DATA:  83 y.o. Female. Per EMS- pt c/o dysuria, foul smelling urine x 4-5 days. Family reports AMS starting at same time. Pt hx of COPD- 2 breathing tx daily, none taken today. Wears O2 PRN at home. EXAM: PORTABLE CHEST 1 VIEW COMPARISON:  11/07/2017 FINDINGS: Heart size is normal. The lungs are free of focal consolidations and pleural effusions. Minimal RIGHT LOWER lobe atelectasis or scarring. Remote rib  fractures. Chronic changes in both shoulders. IMPRESSION: No active disease. Electronically Signed   By: Nolon Nations M.D.   On: 01/25/2019 13:18   Ct Renal Stone Study  Result Date: 01/25/2019 CLINICAL DATA:  Hematuria EXAM: CT ABDOMEN AND PELVIS WITHOUT CONTRAST TECHNIQUE: Multidetector CT imaging of the abdomen and pelvis was performed following the standard protocol without IV contrast. COMPARISON:  None. FINDINGS: Lower chest: Lung bases demonstrate 5 mm right middle lobe pulmonary nodule, series 4, image number 14. Scarring within the right lung base. 5 mm anterior left lung base nodule, series 4, image number 12. There are 2 small 3 mm lingular nodules. Heart size within normal limits. Coronary calcification. Small  hiatal hernia Hepatobiliary: Dilated gallbladder with increased density suggesting sludge. No focal hepatic abnormality or biliary dilatation Pancreas: Unremarkable. No pancreatic ductal dilatation or surrounding inflammatory changes. Spleen: Normal in size without focal abnormality. Adrenals/Urinary Tract: Adrenal glands are normal. No hydronephrosis. No ureteral stone. Small stones in the right kidney. Air within the urinary bladder. Mild wall thickening. Stomach/Bowel: The stomach is nonenlarged. No dilated small bowel. Large stools in the colon slightly hyperdense. Negative appendix. Vascular/Lymphatic: Moderate aortic atherosclerosis. No aneurysmal dilatation. No significantly enlarged lymph nodes. Reproductive: Status post hysterectomy. No adnexal masses. Other: Multiple pelvic sidewall clips.  No free fluid.  No free air. Musculoskeletal: Degenerative changes of the spine. Grade 1 anterolisthesis L5 on S1. Hardware in the right femur with artifact. IMPRESSION: 1. Intrarenal stones on the right without hydronephrosis or ureteral stone. Slight thick-walled appearance of bladder. Moderate air in the bladder, possibly due to recent instrumentation or catheterization. Correlate for this history, if no recent manipulation, could consider gas-forming infection. 2. Distended gallbladder with probable sludge. 3. Multiple pulmonary nodules. No follow-up needed if patient is low-risk (and has no known or suspected primary neoplasm). Non-contrast chest CT can be considered in 12 months if patient is high-risk. This recommendation follows the consensus statement: Guidelines for Management of Incidental Pulmonary Nodules Detected on CT Images: From the Fleischner Society 2017; Radiology 2017; 284:228-243. Electronically Signed   By: Donavan Foil M.D.   On: 01/25/2019 17:00     Subjective: - no chest pain, shortness of breath, no abdominal pain, nausea or vomiting.   Discharge Exam: BP 126/67 (BP Location: Right  Arm)    Pulse 82    Temp 98 F (36.7 C) (Oral)    Resp 20    Ht 4' 11.02" (1.499 m)    Wt 67.6 kg    SpO2 98%    BMI 30.08 kg/m   General: Pt is alert, awake, not in acute distress Cardiovascular: RRR, S1/S2 +, no rubs, no gallops Respiratory: CTA bilaterally, no wheezing, no rhonchi Abdominal: Soft, NT, ND, bowel sounds + Extremities: no edema, no cyanosis    The results of significant diagnostics from this hospitalization (including imaging, microbiology, ancillary and laboratory) are listed below for reference.     Microbiology: Recent Results (from the past 240 hour(s))  SARS CORONAVIRUS 2 (TAT 6-24 HRS) Nasopharyngeal Nasopharyngeal Swab     Status: None   Collection Time: 01/25/19 11:50 AM   Specimen: Nasopharyngeal Swab  Result Value Ref Range Status   SARS Coronavirus 2 NEGATIVE NEGATIVE Final    Comment: (NOTE) SARS-CoV-2 target nucleic acids are NOT DETECTED. The SARS-CoV-2 RNA is generally detectable in upper and lower respiratory specimens during the acute phase of infection. Negative results do not preclude SARS-CoV-2 infection, do not rule out co-infections with other pathogens, and should not  be used as the sole basis for treatment or other patient management decisions. Negative results must be combined with clinical observations, patient history, and epidemiological information. The expected result is Negative. Fact Sheet for Patients: SugarRoll.be Fact Sheet for Healthcare Providers: https://www.woods-mathews.com/ This test is not yet approved or cleared by the [place] FDA and  has been authorized for detection and/or diagnosis of SARS-CoV-2 by FDA under an Emergency Use Authorization (EUA). This EUA will remain  in effect (meaning this test can be used) for the duration of the COVID-19 declaration under Section 56 4(b)(1) of the Act, 21 U.S.C. section 360bbb-3(b)(1), unless the authorization is terminated  or revoked sooner. Performed at [facility], [place] [address] [place], [place] [address]   Urine culture     Status: Abnormal   Collection Time: 01/25/19  2:57 PM   Specimen: Urine, Random  Result Value Ref Range Status   Specimen Description   Final    URINE, RANDOM Performed at [facility] [address] [place], [place] [address]    Special Requests   Final    NONE Performed at [facility], [place] [address], [place], [place] [address]    Culture >=100,000 COLONIES/mL KLEBSIELLA PNEUMONIAE (A)  Final   Report Status 01/27/2019 FINAL  Final   Organism ID, Bacteria KLEBSIELLA PNEUMONIAE (A)  Final      Susceptibility   Klebsiella pneumoniae - MIC*    AMPICILLIN >=32 RESISTANT Resistant     CEFAZOLIN <=4 SENSITIVE Sensitive     CEFTRIAXONE <=1 SENSITIVE Sensitive     CIPROFLOXACIN <=0.25 SENSITIVE Sensitive     GENTAMICIN <=1 SENSITIVE Sensitive     IMIPENEM <=0.25 SENSITIVE Sensitive     NITROFURANTOIN 64 INTERMEDIATE Intermediate     TRIMETH/SULFA <=20 SENSITIVE Sensitive     AMPICILLIN/SULBACTAM 4 SENSITIVE Sensitive     PIP/TAZO <=4 SENSITIVE Sensitive     Extended ESBL NEGATIVE Sensitive     * >=100,000 COLONIES/mL KLEBSIELLA PNEUMONIAE  Blood culture (routine x 2)     Status: None   Collection Time: 01/25/19  3:40 PM   Specimen: BLOOD  Result Value Ref Range Status   Specimen Description   Final    BLOOD RIGHT ANTECUBITAL Performed at [facility] [address], [place], [place] [address]    Special Requests   Final    BOTTLES DRAWN AEROBIC AND ANAEROBIC Blood Culture adequate volume Performed at [facility] [address] [place], [place] [address]    Culture   Final    NO GROWTH 5 DAYS Performed at [facility], [place] [address], [place], [place] [address]    Report Status 01/30/2019 FINAL  Final  Blood culture (routine x 2)     Status: None   Collection Time:  01/25/19  4:09 PM   Specimen: BLOOD  Result Value Ref Range Status   Specimen Description   Final    BLOOD LEFT ANTECUBITAL Performed at [facility], [place] [address], [place], [place] [address]    Special Requests   Final    BOTTLES DRAWN AEROBIC AND ANAEROBIC Blood Culture adequate volume Performed at [facility] [address], [place], [place] [address]    Culture   Final    NO GROWTH 5 DAYS Performed at [facility], [place] [address], [place],  [address]    Report Status 01/30/2019 FINAL  Final     Labs: BNP (  last 3 results) No results for input(s): BNP in the last 8760 hours. Basic Metabolic Panel: Recent Labs  Lab 01/25/19 1249 01/27/19 0555 01/28/19 0537 01/29/19 0545 01/30/19 0510  NA 135 136 135 137 140  K 3.7 3.6 4.5 3.6 3.5  CL 100 101 102 103 102  CO2 25 23 24 26 27   GLUCOSE 231* 272* 379* 288* 117*  BUN 11 9 17 21 16   CREATININE 0.97 0.83 0.87 1.10* 0.87  CALCIUM 8.7* 8.2* 8.8* 8.5* 8.5*  MG  --  2.0 2.1  --   --    Liver Function Tests: No results for input(s): AST, ALT, ALKPHOS, BILITOT, PROT, ALBUMIN in the last 168 hours. No results for input(s): LIPASE, AMYLASE in the last 168 hours. No results for input(s): AMMONIA in the last 168 hours. CBC: Recent Labs  Lab 01/25/19 1249 01/26/19 0541 01/27/19 0555 01/28/19 0537  WBC 17.4* 14.8* 12.5* 19.0*  NEUTROABS 12.7*  --   --   --   HGB 10.7* 10.1* 10.0* 9.7*  HCT 34.3* 33.4* 31.7* 31.0*  MCV 103.0* 106.7* 102.6* 103.0*  PLT 324 274 284 334   Cardiac Enzymes: No results for input(s): CKTOTAL, CKMB, CKMBINDEX, TROPONINI in the last 168 hours. BNP: Invalid input(s): POCBNP CBG: Recent Labs  Lab 01/29/19 1615 01/29/19 1853 01/29/19 2216 01/30/19 0746 01/30/19 1201  GLUCAP 506* 459* 140* 99 144*   D-Dimer No results for input(s): DDIMER in the last 72 hours. Hgb A1c No results for input(s): HGBA1C in the last 72 hours. Lipid Profile No results for  input(s): CHOL, HDL, LDLCALC, TRIG, CHOLHDL, LDLDIRECT in the last 72 hours. Thyroid function studies No results for input(s): TSH, T4TOTAL, T3FREE, THYROIDAB in the last 72 hours.  Invalid input(s): FREET3 Anemia work up No results for input(s): VITAMINB12, FOLATE, FERRITIN, TIBC, IRON, RETICCTPCT in the last 72 hours. Urinalysis    Component Value Date/Time   COLORURINE YELLOW 01/25/2019 1457   APPEARANCEUR CLOUDY (A) 01/25/2019 1457   LABSPEC 1.012 01/25/2019 1457   PHURINE 5.0 01/25/2019 1457   GLUCOSEU NEGATIVE 01/25/2019 1457   HGBUR MODERATE (A) 01/25/2019 1457   BILIRUBINUR NEGATIVE 01/25/2019 1457   KETONESUR 20 (A) 01/25/2019 1457   PROTEINUR 100 (A) 01/25/2019 1457   UROBILINOGEN 0.2 08/02/2014 0231   NITRITE NEGATIVE 01/25/2019 1457   LEUKOCYTESUR LARGE (A) 01/25/2019 1457   Sepsis Labs Invalid input(s): PROCALCITONIN,  WBC,  LACTICIDVEN  FURTHER DISCHARGE INSTRUCTIONS:   Get Medicines reviewed and adjusted: Please take all your medications with you for your next visit with your Primary MD   Laboratory/radiological data: Please request your Primary MD to go over all hospital tests and procedure/radiological results at the follow up, please ask your Primary MD to get all Hospital records sent to his/her office.   In some cases, they will be blood work, cultures and biopsy results pending at the time of your discharge. Please request that your primary care M.D. goes through all the records of your hospital data and follows up on these results.   Also Note the following: If you experience worsening of your admission symptoms, develop shortness of breath, life threatening emergency, suicidal or homicidal thoughts you must seek medical attention immediately by calling 911 or calling your MD immediately  if symptoms less severe.   You must read complete instructions/literature along with all the possible adverse reactions/side effects for all the Medicines you take and  that have been prescribed to you. Take any new Medicines after you have completely  understood and accpet all the possible adverse reactions/side effects.    Do not drive when taking Pain medications or sleeping medications (Benzodaizepines)   Do not take more than prescribed Pain, Sleep and Anxiety Medications. It is not advisable to combine anxiety,sleep and pain medications without talking with your primary care practitioner   Special Instructions: If you have smoked or chewed Tobacco  in the last 2 yrs please stop smoking, stop any regular Alcohol  and or any Recreational drug use.   Wear Seat belts while driving.   Please note: You were cared for by a hospitalist during your hospital stay. Once you are discharged, your primary care physician will handle any further medical issues. Please note that NO REFILLS for any discharge medications will be authorized once you are discharged, as it is imperative that you return to your primary care physician (or establish a relationship with a primary care physician if you do not have one) for your post hospital discharge needs so that they can reassess your need for medications and monitor your lab values.  Time coordinating discharge: 40 minutes  SIGNED:  Marzetta Board, MD, PhD 01/30/2019, 12:32 PM

## 2019-01-30 NOTE — Addendum Note (Signed)
Addended by: Pricilla Holm A on: 01/30/2019 03:52 PM   Modules accepted: Orders

## 2019-01-30 NOTE — Progress Notes (Signed)
Pt being discharged home with home health. Discharge instructions including medications and follow up appointments given. Personal belongings bagged and confirmed with pt. Per MD, pts daughter will arrive within 1 hour to pick up pt. Pt had not questions at this time.

## 2019-01-30 NOTE — Telephone Encounter (Signed)
Sent in 60 mg cymbalta as well.

## 2019-01-30 NOTE — Telephone Encounter (Signed)
Called daughter back she states patient is on a 60mg  and a 30mg  she takes it for her depression as well as the pain for her fibro. Daughter states Dr. Sharlet Salina is aware of this. Informed I could not fill as it was not on her current med list but would send the message back to Dr. Sharlet Salina to look over.

## 2019-01-30 NOTE — Telephone Encounter (Signed)
Is patient supposed 30mg  or 60mg  I have refused it because I see 30 and this is 60

## 2019-01-30 NOTE — Telephone Encounter (Signed)
[name], [name], calling to find out why pt can not get this medication refilled.

## 2019-01-30 NOTE — Telephone Encounter (Signed)
Most recent visit with just the 30 mg cymbalta on list of meds.

## 2019-02-01 ENCOUNTER — Telehealth: Payer: Self-pay | Admitting: *Deleted

## 2019-02-01 ENCOUNTER — Other Ambulatory Visit: Payer: Self-pay | Admitting: Internal Medicine

## 2019-02-01 ENCOUNTER — Other Ambulatory Visit (HOSPITAL_COMMUNITY)
Admission: RE | Admit: 2019-02-01 | Discharge: 2019-02-01 | Disposition: A | Discharge status: Home / Self Care | Payer: Medicare Other | Source: Ambulatory Visit | Attending: Gastroenterology | Admitting: Gastroenterology

## 2019-02-01 DIAGNOSIS — Z20828 Contact with and (suspected) exposure to other viral communicable diseases: Secondary | ICD-10-CM | POA: Insufficient documentation

## 2019-02-01 DIAGNOSIS — Z01812 Encounter for preprocedural laboratory examination: Secondary | ICD-10-CM | POA: Diagnosis not present

## 2019-02-01 NOTE — Telephone Encounter (Signed)
Transition Care Management Follow-up Telephone Call   Date discharged? 01/30/19   How have you been since you were released from the hospital? Pt states she is doing fine   Do you understand why you were in the hospital? YES   Do you understand the discharge instructions? YES   Where were you discharged to? Home   Items Reviewed:  Medications reviewed: YES, pt states she has 2 more days of her antibiotic (cipro), all other meds still the same  Allergies reviewed: YES  Dietary changes reviewed: YES, carb modified   Referrals reviewed: No referral recommeded   Functional Questionnaire:   Activities of Daily Living (ADLs):   She states she are independent in the following: ambulation, bathing and hygiene, feeding, continence, grooming, toileting and dressing States she doesn't require assistance    Any transportation issues/concerns?: NO   Any patient concerns? NO   Confirmed importance and date/time of follow-up visits scheduled YES, 02/13/19  Provider Appointment booked with Dr. Sharlet Salina  Confirmed with patient if condition begins to worsen call PCP or go to the ER.  Patient was given the office number and encouraged to call back with question or concerns.  : YES

## 2019-02-02 ENCOUNTER — Telehealth: Payer: Self-pay | Admitting: *Deleted

## 2019-02-02 ENCOUNTER — Other Ambulatory Visit: Payer: Self-pay | Admitting: *Deleted

## 2019-02-02 ENCOUNTER — Encounter (HOSPITAL_COMMUNITY): Payer: Self-pay | Admitting: *Deleted

## 2019-02-02 LAB — NOVEL CORONAVIRUS, NAA (HOSP ORDER, SEND-OUT TO REF LAB; TAT 18-24 HRS): SARS-CoV-2, NAA: NOT DETECTED

## 2019-02-02 NOTE — Telephone Encounter (Signed)
Copied from [place] [id number]. Topic: General - Other >> Feb 01, 2019  3:18 PM Beverley Fiedler wrote: [facility] called to inform Dr. Sharlet Salina that patients Start of care will be on 02/03/2019

## 2019-02-02 NOTE — Telephone Encounter (Signed)
Noted  

## 2019-02-02 NOTE — Patient Outreach (Signed)
[facility]) Care Management  02/02/2019  Kayla Thompson 11-Jul-1935 [id number]   RED ON EMMI ALERT Day # 1 Date: 02/01/2019 Red Alert Reason: No follow up appointment   Outreach attempt #1, successful.  Member recently admitted to hospital for UTI, discharged on 01/30/2019.  Per chart, she also has history of hypertension, CHF, GERD, hypothyroidism, diabetes, fibromyalgia, and HLD.  Call placed to member to follow up on EMMI red flag.  She report she is doing well, has appointment scheduled with PCP on 11/23.  State she has in home aide that will provide transportation, also has family (daughter & granddaughter) that are well involved in her care.  Denies any needs at this time.   Plan: RN CM will not open case but will send successful outreach letter with this care manager's contact information for future reference.    Valente David, [place], MSN [place] [phone]

## 2019-02-02 NOTE — Progress Notes (Signed)
Patient denies shortness of breath, fever, cough and chest pain.  PCP - Dr Pricilla Holm Cardiologist - denies Pulmonology- Dr Annamaria Boots Internal Med - Dr Philemon Kingdom  Chest x-ray - 01/25/19, 1 view, 10/04/18, 2 view EKG - DOS 02/05/19 Stress Test - denies ECHO - 08/02/14 Cardiac Cath - denies  Fasting Blood Sugar -140s  Checks Blood Sugar 2-3  times a day   . THE MORNING OF SURGERY, take 7 units of Lantus Insulin.  . If your blood sugar is less than 70 mg/dL, you will need to treat for low blood sugar: o Treat a low blood sugar (less than 70 mg/dL) with  cup of clear juice (cranberry or apple) o Recheck blood sugar in 15 minutes after treatment (to make sure it is greater than 70 mg/dL). If your blood sugar is not greater than 70 mg/dL on recheck, call [phone] for further instructions.  Anesthesia review: No  STOP now taking any Aspirin (unless otherwise instructed by your surgeon), Aleve, Naproxen, Ibuprofen, Motrin, Advil, Goody's, BC's, all herbal medications, fish oil, and all vitamins.   Coronavirus Screening Covid test on 02/01/19 was negative.  Patient verbalized understanding of instructions that were given via phone.

## 2019-02-05 ENCOUNTER — Telehealth: Payer: Self-pay | Admitting: Internal Medicine

## 2019-02-05 ENCOUNTER — Ambulatory Visit (HOSPITAL_COMMUNITY): Payer: Medicare Other | Admitting: Anesthesiology

## 2019-02-05 ENCOUNTER — Ambulatory Visit (HOSPITAL_COMMUNITY)
Admission: RE | Admit: 2019-02-05 | Discharge: 2019-02-05 | Disposition: A | Discharge status: Home / Self Care | Payer: Medicare Other | Attending: Gastroenterology | Admitting: Gastroenterology

## 2019-02-05 ENCOUNTER — Other Ambulatory Visit: Payer: Self-pay

## 2019-02-05 ENCOUNTER — Encounter (HOSPITAL_COMMUNITY)
Admission: RE | Disposition: A | Discharge status: Home / Self Care | Payer: Self-pay | Source: Home / Self Care | Attending: Gastroenterology

## 2019-02-05 ENCOUNTER — Encounter (HOSPITAL_COMMUNITY): Payer: Self-pay

## 2019-02-05 DIAGNOSIS — K219 Gastro-esophageal reflux disease without esophagitis: Secondary | ICD-10-CM

## 2019-02-05 DIAGNOSIS — K449 Diaphragmatic hernia without obstruction or gangrene: Secondary | ICD-10-CM | POA: Insufficient documentation

## 2019-02-05 DIAGNOSIS — I10 Essential (primary) hypertension: Secondary | ICD-10-CM | POA: Insufficient documentation

## 2019-02-05 DIAGNOSIS — E119 Type 2 diabetes mellitus without complications: Secondary | ICD-10-CM | POA: Diagnosis not present

## 2019-02-05 DIAGNOSIS — M797 Fibromyalgia: Secondary | ICD-10-CM | POA: Insufficient documentation

## 2019-02-05 DIAGNOSIS — Z86010 Personal history of colonic polyps: Secondary | ICD-10-CM

## 2019-02-05 DIAGNOSIS — K2950 Unspecified chronic gastritis without bleeding: Secondary | ICD-10-CM | POA: Diagnosis not present

## 2019-02-05 DIAGNOSIS — R933 Abnormal findings on diagnostic imaging of other parts of digestive tract: Secondary | ICD-10-CM | POA: Diagnosis not present

## 2019-02-05 DIAGNOSIS — K228 Other specified diseases of esophagus: Secondary | ICD-10-CM

## 2019-02-05 DIAGNOSIS — E039 Hypothyroidism, unspecified: Secondary | ICD-10-CM | POA: Insufficient documentation

## 2019-02-05 DIAGNOSIS — B3781 Candidal esophagitis: Secondary | ICD-10-CM | POA: Diagnosis not present

## 2019-02-05 DIAGNOSIS — Z8543 Personal history of malignant neoplasm of ovary: Secondary | ICD-10-CM | POA: Insufficient documentation

## 2019-02-05 DIAGNOSIS — M810 Age-related osteoporosis without current pathological fracture: Secondary | ICD-10-CM | POA: Insufficient documentation

## 2019-02-05 DIAGNOSIS — R6889 Other general symptoms and signs: Secondary | ICD-10-CM

## 2019-02-05 DIAGNOSIS — R0989 Other specified symptoms and signs involving the circulatory and respiratory systems: Secondary | ICD-10-CM

## 2019-02-05 DIAGNOSIS — M1990 Unspecified osteoarthritis, unspecified site: Secondary | ICD-10-CM | POA: Diagnosis not present

## 2019-02-05 DIAGNOSIS — J449 Chronic obstructive pulmonary disease, unspecified: Secondary | ICD-10-CM | POA: Insufficient documentation

## 2019-02-05 DIAGNOSIS — Z880 Allergy status to penicillin: Secondary | ICD-10-CM | POA: Diagnosis not present

## 2019-02-05 DIAGNOSIS — K3189 Other diseases of stomach and duodenum: Secondary | ICD-10-CM | POA: Insufficient documentation

## 2019-02-05 DIAGNOSIS — R1310 Dysphagia, unspecified: Secondary | ICD-10-CM

## 2019-02-05 HISTORY — PX: SAVORY DILATION: SHX5439

## 2019-02-05 HISTORY — DX: Anxiety disorder, unspecified: F41.9

## 2019-02-05 HISTORY — DX: Unspecified cataract: H26.9

## 2019-02-05 HISTORY — PX: ESOPHAGEAL BRUSHING: SHX6842

## 2019-02-05 HISTORY — DX: Chronic obstructive pulmonary disease, unspecified: J44.9

## 2019-02-05 HISTORY — PX: ESOPHAGOGASTRODUODENOSCOPY (EGD) WITH PROPOFOL: SHX5813

## 2019-02-05 HISTORY — DX: Pneumonia, unspecified organism: J18.9

## 2019-02-05 HISTORY — PX: BIOPSY: SHX5522

## 2019-02-05 HISTORY — DX: Depression, unspecified: F32.A

## 2019-02-05 HISTORY — DX: Hypothyroidism, unspecified: E03.9

## 2019-02-05 HISTORY — DX: Dyspnea, unspecified: R06.00

## 2019-02-05 LAB — GLUCOSE, CAPILLARY
Glucose-Capillary: 119 mg/dL — ABNORMAL HIGH (ref 70–99)
Glucose-Capillary: 131 mg/dL — ABNORMAL HIGH (ref 70–99)

## 2019-02-05 SURGERY — ESOPHAGOGASTRODUODENOSCOPY (EGD) WITH PROPOFOL
Anesthesia: Monitor Anesthesia Care

## 2019-02-05 MED ORDER — SODIUM CHLORIDE 0.9 % IV SOLN: INTRAVENOUS | Status: DC

## 2019-02-05 MED ORDER — FENTANYL CITRATE (PF) 100 MCG/2ML IJ SOLN: 25.0000 ug | INTRAMUSCULAR | Status: DC | PRN

## 2019-02-05 MED ORDER — OXYCODONE HCL 5 MG/5ML PO SOLN: 5.0000 mg | Freq: Once | ORAL | Status: DC | PRN

## 2019-02-05 MED ORDER — LACTATED RINGERS IV SOLN
INTRAVENOUS | Status: DC | PRN
  Administered 2019-02-05: 09:00:00 via INTRAVENOUS

## 2019-02-05 MED ORDER — OXYCODONE HCL 5 MG PO TABS: 5.0000 mg | ORAL_TABLET | Freq: Once | ORAL | Status: DC | PRN

## 2019-02-05 MED ORDER — PROPOFOL 10 MG/ML IV BOLUS
INTRAVENOUS | Status: DC | PRN
  Administered 2019-02-05: 10 mg via INTRAVENOUS
  Administered 2019-02-05: 40 mg via INTRAVENOUS
  Administered 2019-02-05: 20 mg via INTRAVENOUS

## 2019-02-05 MED ORDER — OMEPRAZOLE 20 MG PO CPDR: 20.0000 mg | DELAYED_RELEASE_CAPSULE | Freq: Two times a day (BID) | ORAL | 3 refills | Status: DC

## 2019-02-05 MED ORDER — ONDANSETRON HCL 4 MG/2ML IJ SOLN: 4.0000 mg | Freq: Four times a day (QID) | INTRAMUSCULAR | Status: DC | PRN

## 2019-02-05 MED ORDER — PROPOFOL 500 MG/50ML IV EMUL
INTRAVENOUS | Status: DC | PRN
  Administered 2019-02-05: 120 ug/kg/min via INTRAVENOUS

## 2019-02-05 MED ORDER — LIDOCAINE 2% (20 MG/ML) 5 ML SYRINGE
INTRAMUSCULAR | Status: DC | PRN
  Administered 2019-02-05: 70 mg via INTRAVENOUS

## 2019-02-05 SURGICAL SUPPLY — 14 items

## 2019-02-05 NOTE — H&P (Signed)
GASTROENTEROLOGY PROCEDURE H&P NOTE   Primary Care Physician: [name], MD  HPI: Kayla Thompson is a 83 y.o. female who presents for EGD.  Past Medical History:  Diagnosis Date  . Anxiety   . Arthritis   . Asthma   . Cataracts, bilateral    removed by surgery  . COPD (chronic obstructive pulmonary disease) ([place])   . Depression   . Diabetes mellitus without complication ([place])    type 2  . Dyspnea    with exertion  . Fibromyalgia   . GERD (gastroesophageal reflux disease)   . History of chemotherapy   . History of fractured pelvis   . Hypertension   . Hypothyroidism   . Osteoporosis 12/2017   T score -2.8 stable from prior DEXA  . Osteoporosis   . Ovarian cancer ([place])   . Pneumonia    several times   Past Surgical History:  Procedure Laterality Date  . ABDOMINAL HYSTERECTOMY  1996  . ANKLE FRACTURE SURGERY     plate and screws  . BLADDER SUSPENSION    . CATARACT EXTRACTION    . COLONOSCOPY    . EYE SURGERY     cataracts removed-bilateral  . hip surgey    . knee surgey    . LUNG SURGERY    . OOPHORECTOMY     BSO  . TONSILLECTOMY    . UPPER GI ENDOSCOPY     No current facility-administered medications for this encounter.    Allergies  Allergen Reactions  . Latex Rash  . Penicillins Rash    Has patient had a PCN reaction causing immediate rash, facial/tongue/throat swelling, SOB or lightheadedness with hypotension: No Has patient had a PCN reaction causing severe rash involving mucus membranes or skin necrosis: No Has patient had a PCN reaction that required hospitalization: No Has patient had a PCN reaction occurring within the last 10 years: No If all of the above answers are "NO", then may proceed with Cephalosporin use.    Family History  Problem Relation Age of Onset  . Lung cancer Mother   . CAD Father   . Diabetes Father   . Lung cancer Maternal Aunt   . Diabetes Paternal Aunt   . Diabetes Paternal Uncle   . Diabetes  Paternal Grandmother   . Diabetes Paternal Grandfather   . Colon cancer Neg Hx   . Esophageal cancer Neg Hx   . Inflammatory bowel disease Neg Hx   . Liver disease Neg Hx   . Pancreatic cancer Neg Hx   . Rectal cancer Neg Hx   . Stomach cancer Neg Hx    Social History   Socioeconomic History  . Marital status: Widowed    Spouse name: Not on file  . Number of children: 2  . Years of education: Not on file  . Highest education level: Not on file  Occupational History  . Not on file  Social Needs  . Financial resource strain: Not hard at all  . Food insecurity    Worry: Never true    Inability: Never true  . Transportation needs    Medical: No    Non-medical: No  Tobacco Use  . Smoking status: Never Smoker  . Smokeless tobacco: Never Used  Substance and Sexual Activity  . Alcohol use: No    Alcohol/week: 0.0 standard drinks  . Drug use: No  . Sexual activity: Not Currently    Birth control/protection: Post-menopausal    Comment: declined sexual Hx  questions  Lifestyle  . Physical activity    Days per week: 0 days    Minutes per session: 0 min  . Stress: Not at all  Relationships  . Social connections    Talks on phone: More than three times a week    Gets together: More than three times a week    Attends religious service: 1 to 4 times per year    Active member of club or organization: Yes    Attends meetings of clubs or organizations: 1 to 4 times per year    Relationship status: Widowed  . Intimate partner violence    Fear of current or ex partner: Not on file    Emotionally abused: Not on file    Physically abused: Not on file    Forced sexual activity: Not on file  Other Topics Concern  . Not on file  Social History Narrative  . Not on file    Physical Exam: Vital signs in last 24 hours:     GEN: NAD EYE: Sclerae anicteric ENT: MMM CV: Non-tachycardic GI: Soft, NT/ND NEURO:  Alert & Oriented x 3  Lab Results: No results for input(s): WBC,  HGB, HCT, PLT in the last 72 hours. BMET No results for input(s): NA, K, CL, CO2, GLUCOSE, BUN, CREATININE, CALCIUM in the last 72 hours. LFT No results for input(s): PROT, ALBUMIN, AST, ALT, ALKPHOS, BILITOT, BILIDIR, IBILI in the last 72 hours. PT/INR No results for input(s): LABPROT, INR in the last 72 hours.   Impression / Plan: This is a 83 y.o.female who presents for EGD  The risks and benefits of endoscopic evaluation were discussed with the patient; these include but are not limited to the risk of perforation, infection, bleeding, missed lesions, lack of diagnosis, severe illness requiring hospitalization, as well as anesthesia and sedation related illnesses.  The patient is agreeable to proceed.    Kayla Britain, MD [facility] Office # [phone]

## 2019-02-05 NOTE — Transfer of Care (Signed)
Immediate Anesthesia Transfer of Care Note  Patient: Kayla Thompson  Procedure(s) Performed: ESOPHAGOGASTRODUODENOSCOPY (EGD) WITH PROPOFOL with dialtion (N/A ) BIOPSY ESOPHAGEAL BRUSHING SAVORY DILATION (N/A )  Patient Location: PACU  Anesthesia Type:MAC  Level of Consciousness: awake  Airway & Oxygen Therapy: Patient Spontanous Breathing and Patient connected to nasal cannula oxygen  Post-op Assessment: Report given to RN and Post -op Vital signs reviewed and stable  Post vital signs: Reviewed and stable  Last Vitals:  Vitals Value Taken Time  BP 132/64 02/05/19 0923  Temp 36.3 C 02/05/19 0923  Pulse 80 02/05/19 0923  Resp 20 02/05/19 0923  SpO2 96 % 02/05/19 0923    Last Pain:  Vitals:   02/05/19 0743  PainSc: 2          Complications: No apparent anesthesia complications

## 2019-02-05 NOTE — Telephone Encounter (Signed)
A physical Therapy group called [facility] on 02/03/19 at 3:30pm.  Stated that patient declined PT and may would reschedule for wed 11/18.

## 2019-02-05 NOTE — Discharge Instructions (Signed)
Please increase your Omeprazole to 40 mg twice daily (I spoke with your daughter and you will take two 20 mg capsules twice daily to make it 40 mg) for the next 4-weeks.  Then you may decrease back down to 20 mg twice daily.  Your daughter said you had enough capsules that you would not need a prescription so we are not sending one to the pharmacy, but if you find that you do need this, then please call the office.

## 2019-02-05 NOTE — Op Note (Signed)
[facility] Patient Name: Kayla Thompson Procedure Date : 02/05/2019 MRN: [id number] Attending MD: Justice Britain , MD Date of Birth: Aug 08, 1935 CSN: [id number] Age: 83 Admit Type: Outpatient Procedure:                Upper GI endoscopy Indications:              Dysphagia, Suspected gastro-esophageal reflux                            disease, Abnormal UGI series Providers:                Justice Britain, MD, Vista Lawman, RN, Elspeth Cho Tech., Technician, Lerry Paterson, CRNA Referring MD:             Real Cons. Crawford MD, MD Medicines:                Monitored Anesthesia Care Complications:            No immediate complications. Estimated Blood Loss:     Estimated blood loss was minimal. Procedure:                Pre-Anesthesia Assessment:                           - Prior to the procedure, a History and Physical                            was performed, and patient medications and                            allergies were reviewed. The patient's tolerance of                            previous anesthesia was also reviewed. The risks                            and benefits of the procedure and the sedation                            options and risks were discussed with the patient.                            All questions were answered, and informed consent                            was obtained. Prior Anticoagulants: The patient has                            taken no previous anticoagulant or antiplatelet                            agents. ASA Grade Assessment: III - A patient with  severe systemic disease. After reviewing the risks                            and benefits, the patient was deemed in                            satisfactory condition to undergo the procedure.                           After obtaining informed consent, the endoscope was                            passed under direct  vision. Throughout the                            procedure, the patient's blood pressure, pulse, and                            oxygen saturations were monitored continuously. The                            GIF-H190 ([id number]) Olympus gastroscope was                            introduced through the mouth, and advanced to the                            second part of duodenum. The upper GI endoscopy was                            accomplished without difficulty. The patient                            tolerated the procedure. Scope In: Scope Out: Findings:      White nummular lesions were noted in the entire esophagus - query       Candida vs stasis related changes. Cells for cytology were obtained by       brushing. Biopsies were taken with a cold forceps for histology.      There is no endoscopic evidence of stricture in the entire esophagus.      A guidewire was placed and the scope was withdrawn. Dilation was       performed in the entire esophagus with a Savary dilator with no       resistance at 16 mm and mild resistance at 17 mm. The dilation site was       examined following endoscope reinsertion and showed moderate mucosal       disruption at the UES and no perforation. Heme noted after dilation but       likely a result of previous biopsies that had been obtained.      A 4 cm hiatal hernia was found. The proximal extent of the gastric folds       (end of tubular esophagus) was 31 cm from the incisors. The hiatal       narrowing was 35 cm from the incisors. The Z-line was 31 cm from  the       incisors.      Patchy mildly erythematous mucosa without bleeding was found in the       gastric antrum.      No other gross lesions were noted in the entire examined stomach.       Biopsies were taken with a cold forceps for histology and Helicobacter       pylori testing.      No gross lesions were noted in the duodenal bulb, in the first portion       of the duodenum and in the second  portion of the duodenum. Impression:               - White nummular lesions in esophageal mucosa -                            query Candida. Cells for cytology obtained.                            Biopsied.                           - Dilation performed in the entire esophagus.                            Mucosal rent noted at UES.                           - 4 cm hiatal hernia.                           - Erythematous mucosa in the antrum. Biopsied.                           - No gross lesions in the duodenal bulb, in the                            first portion of the duodenum and in the second                            portion of the duodenum. Recommendation:           - The patient will be observed post-procedure,                            until all discharge criteria are met.                           - Discharge patient to home.                           - Patient has a contact number available for                            emergencies. The signs and symptoms of potential  delayed complications were discussed with the                            patient. Return to normal activities tomorrow.                            Written discharge instructions were provided to the                            patient.                           - Full liquid diet today.                           - Increase to Omeprazole 40 mg twice daily x                            4-weeks. Then can go back down to Omeprazole 20 mg                            twice daily thereafter.                           - Cepacol Lozenges vs Choloraseptic for next 2-3                            days in regards to sore throat that may occur.                           - Await pathology results. If positive for Candida                            will need treatment.                           - Observe patient's clinical course.                           - Repeat upper endoscopy based on patient's                             findings of Candida and her overall clinical status                            - tentatively 71-month but will be seen in clinc                            prior.                           - The findings and recommendations were discussed                            with the patient.                           -  The findings and recommendations were discussed                            with the patient's family. Procedure Code(s):        --- Professional ---                           [phone], Esophagogastroduodenoscopy, flexible,                            transoral; with insertion of guide wire followed by                            passage of dilator(s) through esophagus over guide                            wire Diagnosis Code(s):        --- Professional ---                           K22.8, Other specified diseases of esophagus                           K44.9, Diaphragmatic hernia without obstruction or                            gangrene                           K31.89, Other diseases of stomach and duodenum                           R13.10, Dysphagia, unspecified                           R93.3, Abnormal findings on diagnostic imaging of                            other parts of digestive tract CPT copyright 2019 American Medical Association. All rights reserved. The codes documented in this report are preliminary and upon coder review may  be revised to meet current compliance requirements. Justice Britain, MD 02/05/2019 9:36:53 AM Number of Addenda: 0

## 2019-02-05 NOTE — Anesthesia Preprocedure Evaluation (Signed)
Anesthesia Evaluation  Patient identified by MRN, date of birth, ID band Patient awake    Reviewed: Allergy & Precautions, H&P , NPO status , Patient's Chart, lab work & pertinent test results  Airway Mallampati: II   Neck ROM: full    Dental   Pulmonary asthma , COPD,    breath sounds clear to auscultation       Cardiovascular hypertension,  Rhythm:regular Rate:Normal     Neuro/Psych PSYCHIATRIC DISORDERS Anxiety Depression  Neuromuscular disease    GI/Hepatic GERD  ,  Endo/Other  diabetes, Type 2Hypothyroidism   Renal/GU      Musculoskeletal  (+) Arthritis , Fibromyalgia -  Abdominal   Peds  Hematology  (+) anemia ,   Anesthesia Other Findings   Reproductive/Obstetrics                             Anesthesia Physical Anesthesia Plan  ASA: III  Anesthesia Plan: MAC   Post-op Pain Management:    Induction: Intravenous  PONV Risk Score and Plan: 2 and Propofol infusion and Treatment may vary due to age or medical condition  Airway Management Planned: Nasal Cannula  Additional Equipment:   Intra-op Plan:   Post-operative Plan:   Informed Consent: I have reviewed the patients History and Physical, chart, labs and discussed the procedure including the risks, benefits and alternatives for the proposed anesthesia with the patient or authorized representative who has indicated his/her understanding and acceptance.       Plan Discussed with: CRNA, Anesthesiologist and Surgeon  Anesthesia Plan Comments:         Anesthesia Quick Evaluation

## 2019-02-06 ENCOUNTER — Encounter (HOSPITAL_COMMUNITY): Payer: Self-pay | Admitting: Gastroenterology

## 2019-02-06 LAB — CYTOLOGY - NON PAP

## 2019-02-06 LAB — SURGICAL PATHOLOGY

## 2019-02-06 NOTE — Telephone Encounter (Signed)
Noted  

## 2019-02-06 NOTE — Anesthesia Postprocedure Evaluation (Signed)
Anesthesia Post Note  Patient: CAMBREA KIRT  Procedure(s) Performed: ESOPHAGOGASTRODUODENOSCOPY (EGD) WITH PROPOFOL with dialtion (N/A ) BIOPSY ESOPHAGEAL BRUSHING SAVORY DILATION (N/A )     Patient location during evaluation: PACU Anesthesia Type: MAC Level of consciousness: awake and alert Pain management: pain level controlled Vital Signs Assessment: post-procedure vital signs reviewed and stable Respiratory status: spontaneous breathing, nonlabored ventilation, respiratory function stable and patient connected to nasal cannula oxygen Cardiovascular status: stable and blood pressure returned to baseline Postop Assessment: no apparent nausea or vomiting Anesthetic complications: no    Last Vitals:  Vitals:   02/05/19 0938 02/05/19 0953  BP: 136/74 132/70  Pulse: 77 75  Resp: 20 20  Temp:  36.4 C  SpO2: 96% 95%    Last Pain:  Vitals:   02/06/19 1010  PainSc: 0-No pain                 Taquan Bralley S

## 2019-02-07 ENCOUNTER — Telehealth: Payer: Self-pay | Admitting: Gastroenterology

## 2019-02-07 ENCOUNTER — Other Ambulatory Visit: Payer: Self-pay

## 2019-02-07 ENCOUNTER — Encounter: Payer: Self-pay | Admitting: Gastroenterology

## 2019-02-07 DIAGNOSIS — E039 Hypothyroidism, unspecified: Secondary | ICD-10-CM | POA: Diagnosis not present

## 2019-02-07 DIAGNOSIS — Z9181 History of falling: Secondary | ICD-10-CM | POA: Diagnosis not present

## 2019-02-07 DIAGNOSIS — N390 Urinary tract infection, site not specified: Secondary | ICD-10-CM | POA: Diagnosis not present

## 2019-02-07 DIAGNOSIS — J441 Chronic obstructive pulmonary disease with (acute) exacerbation: Secondary | ICD-10-CM | POA: Diagnosis not present

## 2019-02-07 DIAGNOSIS — Z7952 Long term (current) use of systemic steroids: Secondary | ICD-10-CM | POA: Diagnosis not present

## 2019-02-07 DIAGNOSIS — D638 Anemia in other chronic diseases classified elsewhere: Secondary | ICD-10-CM | POA: Diagnosis not present

## 2019-02-07 DIAGNOSIS — M797 Fibromyalgia: Secondary | ICD-10-CM | POA: Diagnosis not present

## 2019-02-07 DIAGNOSIS — I5032 Chronic diastolic (congestive) heart failure: Secondary | ICD-10-CM | POA: Diagnosis not present

## 2019-02-07 DIAGNOSIS — B961 Klebsiella pneumoniae [K. pneumoniae] as the cause of diseases classified elsewhere: Secondary | ICD-10-CM | POA: Diagnosis not present

## 2019-02-07 DIAGNOSIS — A419 Sepsis, unspecified organism: Secondary | ICD-10-CM | POA: Diagnosis not present

## 2019-02-07 DIAGNOSIS — Z794 Long term (current) use of insulin: Secondary | ICD-10-CM | POA: Diagnosis not present

## 2019-02-07 DIAGNOSIS — R1319 Other dysphagia: Secondary | ICD-10-CM | POA: Diagnosis not present

## 2019-02-07 DIAGNOSIS — M1712 Unilateral primary osteoarthritis, left knee: Secondary | ICD-10-CM | POA: Diagnosis not present

## 2019-02-07 DIAGNOSIS — Z7951 Long term (current) use of inhaled steroids: Secondary | ICD-10-CM | POA: Diagnosis not present

## 2019-02-07 DIAGNOSIS — I110 Hypertensive heart disease with heart failure: Secondary | ICD-10-CM | POA: Diagnosis not present

## 2019-02-07 DIAGNOSIS — G9341 Metabolic encephalopathy: Secondary | ICD-10-CM | POA: Diagnosis not present

## 2019-02-07 DIAGNOSIS — E1165 Type 2 diabetes mellitus with hyperglycemia: Secondary | ICD-10-CM | POA: Diagnosis not present

## 2019-02-07 DIAGNOSIS — E785 Hyperlipidemia, unspecified: Secondary | ICD-10-CM | POA: Diagnosis not present

## 2019-02-07 DIAGNOSIS — M810 Age-related osteoporosis without current pathological fracture: Secondary | ICD-10-CM | POA: Diagnosis not present

## 2019-02-07 DIAGNOSIS — Z8543 Personal history of malignant neoplasm of ovary: Secondary | ICD-10-CM | POA: Diagnosis not present

## 2019-02-07 DIAGNOSIS — J9621 Acute and chronic respiratory failure with hypoxia: Secondary | ICD-10-CM | POA: Diagnosis not present

## 2019-02-07 MED ORDER — FLUCONAZOLE 200 MG PO TABS: ORAL_TABLET | ORAL | 0 refills | Status: DC

## 2019-02-07 NOTE — Telephone Encounter (Signed)
[name] [name] called stating that when you spoke with pt this morning, she was half asleep so did not remember your conversation. Pls call her daughter.

## 2019-02-07 NOTE — Telephone Encounter (Signed)
Left message for daughter to call back.  Notes recorded by Algernon Huxley, RN on 02/07/2019 at 8:53 AM EST  Spoke with pt and she is aware, script sent to pharmacy. Letter mailed to pt. Pt scheduled to see Dr. Jannifer Rodney 03/27/19@1 :30pm. Appt letter mailed per pt request.  ------   Notes recorded by Mansouraty, Telford Nab., MD on 02/07/2019 at 5:53 AM EST  Patty or covering RN,  The patient's pathology and cytology has returned.  Please let her know that her stomach biopsies showed evidence of chronic inflammation noticed gastritis and she should continue on her acid reducing medications.  No evidence of H. pylori.  Her esophagus biopsies and her cytology brushings showed evidence of Candida. This was consistent with what I likely saw during her endoscopy.  She needs to be started on fluconazole.  Dosing will be fluconazole 400 mg on day 1 and 200 mg daily x13 days (days 2-14).  When I had spoken with the patient post procedure, she told me that she had taken this type of medication for yeast infections in the long past and did not have any issues.  Please move forward with also scheduling a follow-up endoscopy in approximately 3 months so that we can reevaluate the area.  She needs a clinic visit in approximately 4 to 6 weeks with me.  I will have the [place] RN send out the letter to the patient. Thanks.  GM

## 2019-02-13 ENCOUNTER — Other Ambulatory Visit: Payer: Self-pay

## 2019-02-13 ENCOUNTER — Encounter: Payer: Self-pay | Admitting: Internal Medicine

## 2019-02-13 ENCOUNTER — Ambulatory Visit (INDEPENDENT_AMBULATORY_CARE_PROVIDER_SITE_OTHER): Payer: Medicare Other | Admitting: Internal Medicine

## 2019-02-13 VITALS — BP 120/60 | HR 91 | Temp 98.0°F | Ht 59.0 in | Wt 142.0 lb

## 2019-02-13 DIAGNOSIS — B3781 Candidal esophagitis: Secondary | ICD-10-CM

## 2019-02-13 DIAGNOSIS — Z87440 Personal history of urinary (tract) infections: Secondary | ICD-10-CM

## 2019-02-13 DIAGNOSIS — N390 Urinary tract infection, site not specified: Secondary | ICD-10-CM

## 2019-02-13 MED ORDER — AMITRIPTYLINE HCL 10 MG PO TABS: 10.0000 mg | ORAL_TABLET | Freq: Every day | ORAL | 3 refills | Status: DC

## 2019-02-13 NOTE — Progress Notes (Signed)
   Subjective:   Patient ID: Kayla Thompson, female    DOB: [birth year], 83 y.o.   MRN: [id number]  HPI The patient is an 83 YO female coming in for hospital follow up (in for UTI, given antibiotics and completed those, denies side effects from medication, did not have symptoms of the uti other than confusion and falling). She denies diarrhea or constipation since. She recently had EGD with GI and is under treatment for candidal esophagitis. Started diflucan and is doing well with this. She is having some relief with the dilation and diflucan with easier swallowing. She denies urinary symptoms. No problems with medications. Denies fevers or chills. Starting home PT soon. She has assistance in the home and her daughter is concerned about her independence and the fact that her mom does not continue with exercises once home health is done and wants to transition to outpatient PT once done with home health.   PMH, [facility], social history reviewed and updated  Review of Systems  Constitutional: Negative.   HENT: Negative.   Eyes: Negative.   Respiratory: Negative for cough, chest tightness and shortness of breath.   Cardiovascular: Negative for chest pain, palpitations and leg swelling.  Gastrointestinal: Negative for abdominal distention, abdominal pain, constipation, diarrhea, nausea and vomiting.  Musculoskeletal: Positive for arthralgias, gait problem and myalgias.  Skin: Negative.   Psychiatric/Behavioral: Negative.     Objective:  Physical Exam Constitutional:      Appearance: She is well-developed.  HENT:     Head: Normocephalic and atraumatic.  Neck:     Musculoskeletal: Normal range of motion.  Cardiovascular:     Rate and Rhythm: Normal rate and regular rhythm.  Pulmonary:     Effort: Pulmonary effort is normal. No respiratory distress.     Breath sounds: Normal breath sounds. No wheezing or rales.     Comments: Lung exam stable Abdominal:     General: Bowel sounds are normal.  There is no distension.     Palpations: Abdomen is soft.     Tenderness: There is no abdominal tenderness. There is no rebound.  Musculoskeletal:        General: Tenderness present.  Skin:    General: Skin is warm and dry.  Neurological:     Mental Status: She is alert and oriented to person, place, and time.     Coordination: Coordination abnormal.     Comments: Walker      Vitals:   02/13/19 1353  BP: 120/60  Pulse: 91  Temp: 98 F (36.7 C)  TempSrc: Oral  Weight: 142 lb (64.4 kg)  Height: 4\' 11"  (1.499 m)    This visit occurred during the SARS-CoV-2 public health emergency.  Safety protocols were in place, including screening questions prior to the visit, additional usage of staff PPE, and extensive cleaning of exam room while observing appropriate contact time as indicated for disinfecting solutions.   Assessment & Plan:

## 2019-02-13 NOTE — Patient Instructions (Signed)
You can try the over the counter urine test and leukocytes and nitrites are signs of urine infection.

## 2019-02-14 ENCOUNTER — Encounter: Payer: Self-pay | Admitting: Internal Medicine

## 2019-02-14 DIAGNOSIS — B3781 Candidal esophagitis: Secondary | ICD-10-CM | POA: Insufficient documentation

## 2019-02-14 DIAGNOSIS — J9621 Acute and chronic respiratory failure with hypoxia: Secondary | ICD-10-CM | POA: Diagnosis not present

## 2019-02-14 DIAGNOSIS — J441 Chronic obstructive pulmonary disease with (acute) exacerbation: Secondary | ICD-10-CM | POA: Diagnosis not present

## 2019-02-14 DIAGNOSIS — B961 Klebsiella pneumoniae [K. pneumoniae] as the cause of diseases classified elsewhere: Secondary | ICD-10-CM | POA: Diagnosis not present

## 2019-02-14 DIAGNOSIS — G9341 Metabolic encephalopathy: Secondary | ICD-10-CM | POA: Diagnosis not present

## 2019-02-14 DIAGNOSIS — A419 Sepsis, unspecified organism: Secondary | ICD-10-CM | POA: Diagnosis not present

## 2019-02-14 DIAGNOSIS — N390 Urinary tract infection, site not specified: Secondary | ICD-10-CM | POA: Diagnosis not present

## 2019-02-14 NOTE — Assessment & Plan Note (Signed)
They have finished treatment and appears to be fully resolved.

## 2019-02-14 NOTE — Assessment & Plan Note (Signed)
Likely due to breathing medications. She is taking diflucan and will follow up with GI to ensure clearance.

## 2019-02-19 ENCOUNTER — Telehealth: Payer: Self-pay | Admitting: Internal Medicine

## 2019-02-19 DIAGNOSIS — J9621 Acute and chronic respiratory failure with hypoxia: Secondary | ICD-10-CM | POA: Diagnosis not present

## 2019-02-19 DIAGNOSIS — B961 Klebsiella pneumoniae [K. pneumoniae] as the cause of diseases classified elsewhere: Secondary | ICD-10-CM | POA: Diagnosis not present

## 2019-02-19 DIAGNOSIS — G9341 Metabolic encephalopathy: Secondary | ICD-10-CM | POA: Diagnosis not present

## 2019-02-19 DIAGNOSIS — J441 Chronic obstructive pulmonary disease with (acute) exacerbation: Secondary | ICD-10-CM | POA: Diagnosis not present

## 2019-02-19 DIAGNOSIS — A419 Sepsis, unspecified organism: Secondary | ICD-10-CM | POA: Diagnosis not present

## 2019-02-19 DIAGNOSIS — N390 Urinary tract infection, site not specified: Secondary | ICD-10-CM | POA: Diagnosis not present

## 2019-02-19 NOTE — Telephone Encounter (Signed)
Fine

## 2019-02-19 NOTE — Telephone Encounter (Signed)
Home Health Verbal Orders - Caller/Agency: L[facility] with [facility] Number: [phone] Requesting OT/PT/Skilled Nursing/Social Work/Speech Therapy: Kayla Thompson for PT, also requesting OT this week  for eval and treat Frequency: Effective 11/18  1 week 2 2 week 6 1 week 1

## 2019-02-19 NOTE — Telephone Encounter (Signed)
Verbals given  

## 2019-02-21 ENCOUNTER — Telehealth: Payer: Self-pay

## 2019-02-21 DIAGNOSIS — A419 Sepsis, unspecified organism: Secondary | ICD-10-CM | POA: Diagnosis not present

## 2019-02-21 DIAGNOSIS — J9621 Acute and chronic respiratory failure with hypoxia: Secondary | ICD-10-CM | POA: Diagnosis not present

## 2019-02-21 DIAGNOSIS — N390 Urinary tract infection, site not specified: Secondary | ICD-10-CM | POA: Diagnosis not present

## 2019-02-21 DIAGNOSIS — G9341 Metabolic encephalopathy: Secondary | ICD-10-CM | POA: Diagnosis not present

## 2019-02-21 DIAGNOSIS — B961 Klebsiella pneumoniae [K. pneumoniae] as the cause of diseases classified elsewhere: Secondary | ICD-10-CM | POA: Diagnosis not present

## 2019-02-21 DIAGNOSIS — J441 Chronic obstructive pulmonary disease with (acute) exacerbation: Secondary | ICD-10-CM | POA: Diagnosis not present

## 2019-02-21 NOTE — Telephone Encounter (Signed)
Fine

## 2019-02-21 NOTE — Telephone Encounter (Signed)
Left detailed message with verbal orders.  

## 2019-02-21 NOTE — Telephone Encounter (Signed)
Copied from [place] [id number]. Topic: General - Other >> Feb 21, 2019  1:23 PM Rainey Pines A wrote: Ailene Ravel from [facility] is requesting verbal order for pt 1w1 and 2w2 and 1w2. Best contact number [phone]

## 2019-02-22 DIAGNOSIS — J9621 Acute and chronic respiratory failure with hypoxia: Secondary | ICD-10-CM | POA: Diagnosis not present

## 2019-02-22 DIAGNOSIS — B961 Klebsiella pneumoniae [K. pneumoniae] as the cause of diseases classified elsewhere: Secondary | ICD-10-CM | POA: Diagnosis not present

## 2019-02-22 DIAGNOSIS — G9341 Metabolic encephalopathy: Secondary | ICD-10-CM | POA: Diagnosis not present

## 2019-02-22 DIAGNOSIS — J441 Chronic obstructive pulmonary disease with (acute) exacerbation: Secondary | ICD-10-CM | POA: Diagnosis not present

## 2019-02-22 DIAGNOSIS — A419 Sepsis, unspecified organism: Secondary | ICD-10-CM | POA: Diagnosis not present

## 2019-02-22 DIAGNOSIS — N390 Urinary tract infection, site not specified: Secondary | ICD-10-CM | POA: Diagnosis not present

## 2019-02-23 ENCOUNTER — Other Ambulatory Visit (HOSPITAL_COMMUNITY)
Admission: RE | Admit: 2019-02-23 | Discharge: 2019-02-23 | Disposition: A | Discharge status: Home / Self Care | Payer: Medicare Other | Source: Ambulatory Visit | Attending: Internal Medicine | Admitting: Internal Medicine

## 2019-02-23 DIAGNOSIS — Z20828 Contact with and (suspected) exposure to other viral communicable diseases: Secondary | ICD-10-CM | POA: Diagnosis not present

## 2019-02-23 DIAGNOSIS — Z01812 Encounter for preprocedural laboratory examination: Secondary | ICD-10-CM | POA: Diagnosis not present

## 2019-02-23 LAB — SARS CORONAVIRUS 2 (TAT 6-24 HRS): SARS Coronavirus 2: NEGATIVE

## 2019-02-26 ENCOUNTER — Ambulatory Visit (INDEPENDENT_AMBULATORY_CARE_PROVIDER_SITE_OTHER): Payer: Medicare Other | Admitting: Internal Medicine

## 2019-02-26 ENCOUNTER — Other Ambulatory Visit: Payer: Self-pay

## 2019-02-26 ENCOUNTER — Encounter: Payer: Self-pay | Admitting: Internal Medicine

## 2019-02-26 DIAGNOSIS — Z23 Encounter for immunization: Secondary | ICD-10-CM | POA: Diagnosis not present

## 2019-02-26 DIAGNOSIS — I5032 Chronic diastolic (congestive) heart failure: Secondary | ICD-10-CM | POA: Diagnosis not present

## 2019-02-26 DIAGNOSIS — J449 Chronic obstructive pulmonary disease, unspecified: Secondary | ICD-10-CM | POA: Diagnosis not present

## 2019-02-26 DIAGNOSIS — J441 Chronic obstructive pulmonary disease with (acute) exacerbation: Secondary | ICD-10-CM | POA: Diagnosis not present

## 2019-02-26 DIAGNOSIS — J9621 Acute and chronic respiratory failure with hypoxia: Secondary | ICD-10-CM | POA: Diagnosis not present

## 2019-02-26 DIAGNOSIS — N390 Urinary tract infection, site not specified: Secondary | ICD-10-CM | POA: Diagnosis not present

## 2019-02-26 DIAGNOSIS — G9341 Metabolic encephalopathy: Secondary | ICD-10-CM | POA: Diagnosis not present

## 2019-02-26 DIAGNOSIS — J479 Bronchiectasis, uncomplicated: Secondary | ICD-10-CM | POA: Diagnosis not present

## 2019-02-26 DIAGNOSIS — A419 Sepsis, unspecified organism: Secondary | ICD-10-CM | POA: Diagnosis not present

## 2019-02-26 DIAGNOSIS — B961 Klebsiella pneumoniae [K. pneumoniae] as the cause of diseases classified elsewhere: Secondary | ICD-10-CM | POA: Diagnosis not present

## 2019-02-26 LAB — PULMONARY FUNCTION TEST
DL/VA % pred: 76 %
DL/VA: 3.22 ml/min/mmHg/L
DLCO unc % pred: 65 %
DLCO unc: 10.5 ml/min/mmHg
FEF 25-75 Post: 0.61 L/sec
FEF 25-75 Pre: 0.6 L/sec
FEF2575-%Change-Post: 1 %
FEF2575-%Pred-Post: 54 %
FEF2575-%Pred-Pre: 53 %
FEV1-%Change-Post: 1 %
FEV1-%Pred-Post: 74 %
FEV1-%Pred-Pre: 73 %
FEV1-Post: 1.12 L
FEV1-Pre: 1.11 L
FEV1FVC-%Change-Post: 0 %
FEV1FVC-%Pred-Pre: 87 %
FEV6-%Change-Post: -2 %
FEV6-%Pred-Post: 86 %
FEV6-%Pred-Pre: 88 %
FEV6-Post: 1.67 L
FEV6-Pre: 1.7 L
FEV6FVC-%Change-Post: -3 %
FEV6FVC-%Pred-Post: 101 %
FEV6FVC-%Pred-Pre: 105 %
FVC-%Change-Post: 1 %
FVC-%Pred-Post: 84 %
FVC-%Pred-Pre: 83 %
FVC-Post: 1.75 L
FVC-Pre: 1.72 L
Post FEV1/FVC ratio: 64 %
Post FEV6/FVC ratio: 95 %
Pre FEV1/FVC ratio: 64 %
Pre FEV6/FVC Ratio: 99 %
RV % pred: 108 %
RV: 2.39 L
TLC % pred: 95 %
TLC: 4.24 L

## 2019-02-26 MED ORDER — TRELEGY ELLIPTA 100-62.5-25 MCG/INH IN AEPB: 1.0000 | INHALATION_SPRAY | Freq: Every day | RESPIRATORY_TRACT | 0 refills | Status: DC

## 2019-02-26 MED ORDER — TRELEGY ELLIPTA 100-62.5-25 MCG/INH IN AEPB: 1.0000 | INHALATION_SPRAY | Freq: Every day | RESPIRATORY_TRACT | 12 refills | Status: DC

## 2019-02-26 NOTE — Patient Instructions (Addendum)
Sample x 2 Trelegy 100    Inhale 1 puff, then rinse mouth, once daily  Try Trelegy instead of Spira and Advair. If you like the Trelegy, you can take the print script to the drug store to compare price. If you decide to stay with Spiriva plus Advair, that is ok.   Order - Prevnar 13 pneumonia vaccine

## 2019-02-26 NOTE — Progress Notes (Signed)
HPI F never smoker followed for asthma/ COPD/ Tracheobronchomalacia,Bronchiectasis, Granulomatous lung disease,  Chronic Hypoxic Resp Failure,  GERD, complicated by  DM2, Hypothyroid, HBP, dCHF Office Spirometry 01/07/15- Mild obstructive airways disease- FVC 7.2/ 100%, FEV1 1.4/ 83%, FEV1/FVC 0.62, FEF25-75% 44% PFT 02/26/2019- Mild obstruction, Diffusion moderately reduced, no resp to BD, -----------------------------------------------------------------------------------------  10/04/2018-  31 yo F never smoker seeing me to establish for pulmonary care. former SN pt, referred by Dr. Sharlet Salina (PCP) for asthma and COPD, reports increased DOE Medical problem list includes HBP, dCHF, Tracheobronchomalacia, Granulomatous lung disease, Bronchiectasis, Asthma, GERD, DM2, Hypothyroid, Hyperparathyroidism, Osteoporosis, PMR,  Dr Valeta Harms: "PMH of asthma dx 20+ years ago, takes advair 500/50 and as needed albuterol. Started on chronic prednisone 2mg  every day for the past 20 years. Started by Dr. Lollie Marrow in [place].  She has been followed by Dr. Lenna Gilford for ongoing shortness of breath and her asthma.  She is currently managed with Advair and Spiriva.  Prior pulmonary function tests with no evidence of obstruction.  She does however have cylindric and varicoid bronchiectasis on CT imaging.  Her last axial CT imaging was in 2016.  She did have a episode of pneumonia in July 2019.  She was treated with antibiotics during this time and had a follow-up chest imaging with an x-ray in August 2019 which showed resolution.  She was seen by 1 of our nurse practitioners, Lazaro Arms a few weeks ago.  She was treated for a bronchiectasis exacerbation as she complained of chest tightness wheezing and congestion.  She got antibiotics and steroids to include Doxy and Medrol Dosepak but never took any of the medications.  She said she started to feel better after she left the office." -----former SN pt, referred by Dr. Sharlet Salina (PCP) for  asthma and COPD, reports increased DOE She wishes to establish with me for pulmonary care, and tells me she is at baseline. Routine cough, usually worst in the morning then minor through the day. Clear to darker nonbloody sputum, usually scant. Has not had a fluttervalve or pneumatic vest. Dyspnea walking, relieved by rest. O2 2L/ [employer]- Has oxygen concentrator and POC. Doesn't use concentrator much due to loud, causes headaches.  Rescue inhaler 1-2x/ week, Spiriva, Advair 500 and maintenance prednisone routinely, Nebulizer 2-3x daily. Pending ENT for swallowing difficulty.  Pending L shoulder surgery. Office Spirometry 01/07/15- Mild obstructive airways disease- FVC 7.2/ 100%, FEV1 1.4/ 83%, FEV1/FVC 0.62, FEF25-75% 44% CXR 11/07/17- IMPRESSION: Resolved LEFT LOWER lung pneumonia.  No acute abnormality.  02/26/2019- 83 yoF never smoker followed for asthma/ COPD/ Tracheobronchomalacia,Bronchiectasis, Granulomatous lung disease,  GERD, complicated by  DM2, Hypothyroid, HBP, dCHF,  O2 2L [employer],  Rescue inhaler 1-2x/ week, Spiriva, Advair 500 and maintenance prednisone 2mg , Nebulizer 2-3x daily. Singulair,  -----f/u PFT, COPD, SOB  Arrival sat 95% room air walking.            Daughter here On prednisone x many years. DOE on room air in hallway. Cough routinely productive clear sputum.  PFT 02/26/2019- Mild obstruction, Diffusion moderately reduced, no resp to BD,  CXR- 01/25/2019- 1V Heart size is normal. The lungs are free of focal consolidations and pleural effusions. Minimal RIGHT LOWER lobe atelectasis or scarring. Remote rib fractures. Chronic changes in both shoulders. IMPRESSION: No active disease.  ROS-see HPI   + = positive Constitutional:    weight loss, night sweats, fevers, chills, fatigue, lassitude. HEENT:    headaches, +difficulty swallowing, tooth/dental problems, sore throat,  sneezing, itching, ear ache, nasal congestion,  post nasal drip, snoring CV:    chest pain,  orthopnea, PND, swelling in lower extremities, anasarca,  dizziness, palpitations Resp:   +shortness of breath with exertion or at rest.                +productive cough,  + non-productive cough, coughing up of blood.              change in color of mucus.  +wheezing.   Skin:    rash or lesions. GI:  No-   heartburn, indigestion, abdominal pain, nausea, vomiting, diarrhea,                 change in bowel habits, loss of appetite GU: dysuria, change in color of urine, no urgency or frequency.   flank pain. MS:   +joint pain, stiffness, decreased range of motion, back pain. Neuro-     nothing unusual Psych:  change in mood or affect.  depression or anxiety.   memory loss.  OBJ- Physical Exam General- Alert, Oriented, Affect-appropriate, Distress- none acute,  + overweight Skin- rash-none, lesions- none, excoriation- none Lymphadenopathy- none Head- atraumatic            Eyes- Gross vision intact, PERRLA, conjunctivae and secretions clear            Ears- Hearing, canals-normal            Nose- Clear, no-Septal dev, mucus, polyps, erosion, perforation             Throat- Mallampati II , mucosa clear , drainage- none, tonsils- atrophic Neck- flexible , trachea midline, no stridor , thyroid nl, carotid no bruit Chest - symmetrical excursion , unlabored           Heart/CV- RRR , no murmur , no gallop  , no rub, nl s1 s2                           - JVD- none , edema- none, stasis changes- none, varices- none           Lung- + raspy/ coarse, wheeze+, cough- none , dullness-none, rub- none           Chest wall-  Abd-  Br/ Gen/ Rectal- Not done, not indicated Extrem- cyanosis- none, clubbing, none, atrophy- none, strength- nl Neuro- grossly intact to observation

## 2019-02-26 NOTE — Progress Notes (Signed)
PFT done today. 

## 2019-02-27 NOTE — Assessment & Plan Note (Signed)
Not in active heart failure at this visit. Well controlled.

## 2019-02-27 NOTE — Assessment & Plan Note (Signed)
Asthmatic bronchitis symptoms predominate. She has been steroid dependent for many years, and can presume adrenal insuff. Plan- Try replacing Advair 500/ Spiriva with Trelegy, give Prevnar 13 after discussion.. Advise Covid vax when available.

## 2019-02-28 DIAGNOSIS — A419 Sepsis, unspecified organism: Secondary | ICD-10-CM | POA: Diagnosis not present

## 2019-02-28 DIAGNOSIS — B961 Klebsiella pneumoniae [K. pneumoniae] as the cause of diseases classified elsewhere: Secondary | ICD-10-CM | POA: Diagnosis not present

## 2019-02-28 DIAGNOSIS — J9621 Acute and chronic respiratory failure with hypoxia: Secondary | ICD-10-CM | POA: Diagnosis not present

## 2019-02-28 DIAGNOSIS — J441 Chronic obstructive pulmonary disease with (acute) exacerbation: Secondary | ICD-10-CM | POA: Diagnosis not present

## 2019-02-28 DIAGNOSIS — N390 Urinary tract infection, site not specified: Secondary | ICD-10-CM | POA: Diagnosis not present

## 2019-02-28 DIAGNOSIS — G9341 Metabolic encephalopathy: Secondary | ICD-10-CM | POA: Diagnosis not present

## 2019-03-01 DIAGNOSIS — G9341 Metabolic encephalopathy: Secondary | ICD-10-CM | POA: Diagnosis not present

## 2019-03-01 DIAGNOSIS — A419 Sepsis, unspecified organism: Secondary | ICD-10-CM | POA: Diagnosis not present

## 2019-03-01 DIAGNOSIS — N390 Urinary tract infection, site not specified: Secondary | ICD-10-CM | POA: Diagnosis not present

## 2019-03-01 DIAGNOSIS — J9621 Acute and chronic respiratory failure with hypoxia: Secondary | ICD-10-CM | POA: Diagnosis not present

## 2019-03-01 DIAGNOSIS — J441 Chronic obstructive pulmonary disease with (acute) exacerbation: Secondary | ICD-10-CM | POA: Diagnosis not present

## 2019-03-01 DIAGNOSIS — B961 Klebsiella pneumoniae [K. pneumoniae] as the cause of diseases classified elsewhere: Secondary | ICD-10-CM | POA: Diagnosis not present

## 2019-03-05 DIAGNOSIS — J441 Chronic obstructive pulmonary disease with (acute) exacerbation: Secondary | ICD-10-CM | POA: Diagnosis not present

## 2019-03-05 DIAGNOSIS — N390 Urinary tract infection, site not specified: Secondary | ICD-10-CM | POA: Diagnosis not present

## 2019-03-05 DIAGNOSIS — J9621 Acute and chronic respiratory failure with hypoxia: Secondary | ICD-10-CM | POA: Diagnosis not present

## 2019-03-05 DIAGNOSIS — A419 Sepsis, unspecified organism: Secondary | ICD-10-CM | POA: Diagnosis not present

## 2019-03-05 DIAGNOSIS — G9341 Metabolic encephalopathy: Secondary | ICD-10-CM | POA: Diagnosis not present

## 2019-03-05 DIAGNOSIS — B961 Klebsiella pneumoniae [K. pneumoniae] as the cause of diseases classified elsewhere: Secondary | ICD-10-CM | POA: Diagnosis not present

## 2019-03-06 ENCOUNTER — Telehealth: Payer: Self-pay | Admitting: Internal Medicine

## 2019-03-06 DIAGNOSIS — A419 Sepsis, unspecified organism: Secondary | ICD-10-CM | POA: Diagnosis not present

## 2019-03-06 DIAGNOSIS — G9341 Metabolic encephalopathy: Secondary | ICD-10-CM | POA: Diagnosis not present

## 2019-03-06 DIAGNOSIS — B961 Klebsiella pneumoniae [K. pneumoniae] as the cause of diseases classified elsewhere: Secondary | ICD-10-CM | POA: Diagnosis not present

## 2019-03-06 DIAGNOSIS — J9621 Acute and chronic respiratory failure with hypoxia: Secondary | ICD-10-CM | POA: Diagnosis not present

## 2019-03-06 DIAGNOSIS — N390 Urinary tract infection, site not specified: Secondary | ICD-10-CM | POA: Diagnosis not present

## 2019-03-06 DIAGNOSIS — J441 Chronic obstructive pulmonary disease with (acute) exacerbation: Secondary | ICD-10-CM | POA: Diagnosis not present

## 2019-03-06 NOTE — Telephone Encounter (Signed)
Insurance has been submitted and verified for Prolia. Patient is responsible for a $0 copay. Due anytime. Left message for patient to call back to schedule.  Okay to schedule... Visit Note: Prolia ($0 copay - okay to give per [place]) Visit Type: Nurse Provider: Nurse 

## 2019-03-07 DIAGNOSIS — B961 Klebsiella pneumoniae [K. pneumoniae] as the cause of diseases classified elsewhere: Secondary | ICD-10-CM | POA: Diagnosis not present

## 2019-03-07 DIAGNOSIS — J441 Chronic obstructive pulmonary disease with (acute) exacerbation: Secondary | ICD-10-CM | POA: Diagnosis not present

## 2019-03-07 DIAGNOSIS — N390 Urinary tract infection, site not specified: Secondary | ICD-10-CM | POA: Diagnosis not present

## 2019-03-07 DIAGNOSIS — A419 Sepsis, unspecified organism: Secondary | ICD-10-CM | POA: Diagnosis not present

## 2019-03-07 DIAGNOSIS — G9341 Metabolic encephalopathy: Secondary | ICD-10-CM | POA: Diagnosis not present

## 2019-03-07 DIAGNOSIS — J9621 Acute and chronic respiratory failure with hypoxia: Secondary | ICD-10-CM | POA: Diagnosis not present

## 2019-03-08 ENCOUNTER — Other Ambulatory Visit: Payer: Self-pay | Admitting: Internal Medicine

## 2019-03-08 ENCOUNTER — Ambulatory Visit
Admission: RE | Admit: 2019-03-08 | Discharge: 2019-03-08 | Disposition: A | Discharge status: Home / Self Care | Payer: Medicare Other | Source: Ambulatory Visit | Attending: Gynecology | Admitting: Gynecology

## 2019-03-08 ENCOUNTER — Other Ambulatory Visit: Payer: Self-pay

## 2019-03-08 DIAGNOSIS — Z1231 Encounter for screening mammogram for malignant neoplasm of breast: Secondary | ICD-10-CM

## 2019-03-09 DIAGNOSIS — M810 Age-related osteoporosis without current pathological fracture: Secondary | ICD-10-CM | POA: Diagnosis not present

## 2019-03-09 DIAGNOSIS — Z7951 Long term (current) use of inhaled steroids: Secondary | ICD-10-CM | POA: Diagnosis not present

## 2019-03-09 DIAGNOSIS — I5032 Chronic diastolic (congestive) heart failure: Secondary | ICD-10-CM | POA: Diagnosis not present

## 2019-03-09 DIAGNOSIS — Z794 Long term (current) use of insulin: Secondary | ICD-10-CM | POA: Diagnosis not present

## 2019-03-09 DIAGNOSIS — M1712 Unilateral primary osteoarthritis, left knee: Secondary | ICD-10-CM | POA: Diagnosis not present

## 2019-03-09 DIAGNOSIS — G9341 Metabolic encephalopathy: Secondary | ICD-10-CM | POA: Diagnosis not present

## 2019-03-09 DIAGNOSIS — E039 Hypothyroidism, unspecified: Secondary | ICD-10-CM | POA: Diagnosis not present

## 2019-03-09 DIAGNOSIS — Z8543 Personal history of malignant neoplasm of ovary: Secondary | ICD-10-CM | POA: Diagnosis not present

## 2019-03-09 DIAGNOSIS — E1165 Type 2 diabetes mellitus with hyperglycemia: Secondary | ICD-10-CM | POA: Diagnosis not present

## 2019-03-09 DIAGNOSIS — D638 Anemia in other chronic diseases classified elsewhere: Secondary | ICD-10-CM | POA: Diagnosis not present

## 2019-03-09 DIAGNOSIS — J441 Chronic obstructive pulmonary disease with (acute) exacerbation: Secondary | ICD-10-CM | POA: Diagnosis not present

## 2019-03-09 DIAGNOSIS — N390 Urinary tract infection, site not specified: Secondary | ICD-10-CM | POA: Diagnosis not present

## 2019-03-09 DIAGNOSIS — B961 Klebsiella pneumoniae [K. pneumoniae] as the cause of diseases classified elsewhere: Secondary | ICD-10-CM | POA: Diagnosis not present

## 2019-03-09 DIAGNOSIS — Z9181 History of falling: Secondary | ICD-10-CM | POA: Diagnosis not present

## 2019-03-09 DIAGNOSIS — R1319 Other dysphagia: Secondary | ICD-10-CM | POA: Diagnosis not present

## 2019-03-09 DIAGNOSIS — I110 Hypertensive heart disease with heart failure: Secondary | ICD-10-CM | POA: Diagnosis not present

## 2019-03-09 DIAGNOSIS — Z7952 Long term (current) use of systemic steroids: Secondary | ICD-10-CM | POA: Diagnosis not present

## 2019-03-09 DIAGNOSIS — A419 Sepsis, unspecified organism: Secondary | ICD-10-CM | POA: Diagnosis not present

## 2019-03-09 DIAGNOSIS — E785 Hyperlipidemia, unspecified: Secondary | ICD-10-CM | POA: Diagnosis not present

## 2019-03-09 DIAGNOSIS — J9621 Acute and chronic respiratory failure with hypoxia: Secondary | ICD-10-CM | POA: Diagnosis not present

## 2019-03-09 DIAGNOSIS — M797 Fibromyalgia: Secondary | ICD-10-CM | POA: Diagnosis not present

## 2019-03-12 ENCOUNTER — Telehealth: Payer: Self-pay | Admitting: Internal Medicine

## 2019-03-12 DIAGNOSIS — N390 Urinary tract infection, site not specified: Secondary | ICD-10-CM | POA: Diagnosis not present

## 2019-03-12 DIAGNOSIS — G9341 Metabolic encephalopathy: Secondary | ICD-10-CM | POA: Diagnosis not present

## 2019-03-12 DIAGNOSIS — B961 Klebsiella pneumoniae [K. pneumoniae] as the cause of diseases classified elsewhere: Secondary | ICD-10-CM | POA: Diagnosis not present

## 2019-03-12 DIAGNOSIS — A419 Sepsis, unspecified organism: Secondary | ICD-10-CM | POA: Diagnosis not present

## 2019-03-12 DIAGNOSIS — J441 Chronic obstructive pulmonary disease with (acute) exacerbation: Secondary | ICD-10-CM | POA: Diagnosis not present

## 2019-03-12 DIAGNOSIS — J9621 Acute and chronic respiratory failure with hypoxia: Secondary | ICD-10-CM | POA: Diagnosis not present

## 2019-03-12 NOTE — Telephone Encounter (Signed)
Unable to reach the patient daughter with the contact number provided.  Called the patient direct and advised of response received from Dr. Annamaria Boots. Patient voiced understanding. Nothing further needed at this time.

## 2019-03-12 NOTE — Telephone Encounter (Signed)
Yes- continue singulair once daily at night

## 2019-03-12 NOTE — Telephone Encounter (Signed)
Spoke with daughter [name], she states the provider made recent changes to her inhalers, and wanted to confirm patient is still to take her singulair at night time.   Dr. Annamaria Boots please advise if patient is still taking singulair at bedtime last refilledon 11/13/18 for 90 days no refills.   Allergies  Allergen Reactions  . Latex Rash  . Penicillins Rash    Has patient had a PCN reaction causing immediate rash, facial/tongue/throat swelling, SOB or lightheadedness with hypotension: No Has patient had a PCN reaction causing severe rash involving mucus membranes or skin necrosis: No Has patient had a PCN reaction that required hospitalization: No Has patient had a PCN reaction occurring within the last 10 years: No If all of the above answers are "NO", then may proceed with Cephalosporin use.     Current Outpatient Medications on File Prior to Visit  Medication Sig Dispense Refill  . albuterol (PROVENTIL HFA;VENTOLIN HFA) 108 (90 Base) MCG/ACT inhaler Inhale 2 puffs into the lungs every 6 (six) hours as needed for wheezing or shortness of breath. 1 Inhaler 11  . amitriptyline (ELAVIL) 10 MG tablet Take 1 tablet (10 mg total) by mouth at bedtime. 90 tablet 3  . baclofen (LIORESAL) 10 MG tablet TAKE 0.5-1 TABLET BY MOUTH TWICE DAILY AS NEEDED FOR MUSCLE SPASMS OR PAIN (Patient taking differently: Take 10 mg by mouth daily. ) 180 tablet 1  . BD PEN NEEDLE NANO U/F 32G X 4 MM MISC USE 4 TIMES A DAY 400 each 1  . busPIRone (BUSPAR) 5 MG tablet TAKE 1 TO 2 TABLETS BY MOUTH TWICE A DAY (Patient taking differently: Take 5-10 mg by mouth daily as needed (anxiety). ) 360 tablet 1  . calcium carbonate (OSCAL) 1500 (600 Ca) MG TABS tablet Take 1,500 mg by mouth 2 (two) times daily with a meal.     . carvedilol (COREG) 3.125 MG tablet Take 3.125 mg by mouth 2 (two) times daily with a meal.    . Cholecalciferol (VITAMIN D3) 2000 units TABS Take 2 tablets by mouth daily.     Marland Kitchen Dextromethorphan-Guaifenesin  (MUCINEX DM MAXIMUM STRENGTH) 60-1200 MG TB12 Take 1 tablet by mouth 2 (two) times daily. Reported on 06/23/2015    . DULoxetine (CYMBALTA) 30 MG capsule TAKE 1 CAPSULE BY MOUTH EVERY DAY (Patient taking differently: Take 30 mg by mouth daily. ) 90 capsule 0  . DULoxetine (CYMBALTA) 60 MG capsule Take 1 capsule (60 mg total) by mouth daily. 90 capsule 3  . fluconazole (DIFLUCAN) 200 MG tablet Take 2 tabs by mouth on day 1, take 1 tab by mouth for 13 days 15 tablet 0  . Fluticasone-Salmeterol (ADVAIR DISKUS) 500-50 MCG/DOSE AEPB Inhale 1 puff into the lungs 2 (two) times a day. Rinse mouth 60 each 12  . Fluticasone-Umeclidin-Vilant (TRELEGY ELLIPTA) 100-62.5-25 MCG/INH AEPB Inhale 1 puff into the lungs daily. Rinse mouth 60 each 12  . Fluticasone-Umeclidin-Vilant (TRELEGY ELLIPTA) 100-62.5-25 MCG/INH AEPB Inhale 1 puff into the lungs daily. 2 each 0  . glucose blood (FREESTYLE TEST STRIPS) test strip Use to test blood sugar 3 times daily. Dx: E11.65 100 each 3  . Insulin Glargine (LANTUS SOLOSTAR) 100 UNIT/ML Solostar Pen Inject 20 Units into the skin every morning. (Patient taking differently: Inject 15 Units into the skin every morning. ) 27 pen 1  . Lancets (FREESTYLE) lancets Use to test blood sugar 3 times daily. Dx: E11.65 100 each 3  . levothyroxine (SYNTHROID) 50 MCG tablet TAKE 1 TABLET BY  MOUTH EVERY DAY BEFORE BREAKFAST (Patient taking differently: Take 50 mcg by mouth daily before breakfast. ) 90 tablet 1  . magnesium oxide (MAG-OX) 400 MG tablet TAKE 1 TABLET BY MOUTH EVERY DAY 90 tablet 1  . montelukast (SINGULAIR) 10 MG tablet TAKE 1 TABLET BY MOUTH EVERY DAY (Patient taking differently: Take 10 mg by mouth at bedtime. TAKE 1 TABLET BY MOUTH EVERY DAY) 90 tablet 0  . Multiple Vitamins-Minerals (CENTRUM ADULTS PO) Take 1 tablet by mouth daily.     Marland Kitchen omeprazole (PRILOSEC) 20 MG capsule Take 1 capsule (20 mg total) by mouth 2 (two) times daily before a meal. 60 capsule 3  . Polyethyl  Glycol-Propyl Glycol (SYSTANE FREE OP) Apply 1 drop to eye 3 (three) times daily.    . predniSONE (DELTASONE) 1 MG tablet Take 2 tablets (2 mg total) by mouth daily. 60 tablet 1  . rosuvastatin (CRESTOR) 10 MG tablet TAKE 1 TABLET BY MOUTH EVERY DAY IN THE EVENING 90 tablet 2  . tiotropium (SPIRIVA HANDIHALER) 18 MCG inhalation capsule PLACE 1 CAPSULE INTO INHALER AND INHALE AS DIRECTED EVERY DAY 30 capsule 12  . traMADol (ULTRAM) 50 MG tablet Take 1 tablet (50 mg total) by mouth at bedtime as needed for moderate pain. 20 tablet 0  . UNABLE TO FIND Take 1,000 mg by mouth daily. Med Name: Tumeric    . UNABLE TO FIND Take 1,000 mg by mouth daily. Med Name: Zannie Cove    . vitamin B-12 (CYANOCOBALAMIN) 250 MCG tablet Take 250 mcg by mouth See admin instructions. Twice weekly    . [DISCONTINUED] ipratropium-albuterol (DUONEB) 0.5-2.5 (3) MG/3ML SOLN USE 1 VIAL IN NEBULIZER 3 TIMES DAILY 90 mL 2   No current facility-administered medications on file prior to visit.

## 2019-03-13 ENCOUNTER — Other Ambulatory Visit: Payer: Self-pay | Admitting: Internal Medicine

## 2019-03-13 DIAGNOSIS — B961 Klebsiella pneumoniae [K. pneumoniae] as the cause of diseases classified elsewhere: Secondary | ICD-10-CM | POA: Diagnosis not present

## 2019-03-13 DIAGNOSIS — N390 Urinary tract infection, site not specified: Secondary | ICD-10-CM | POA: Diagnosis not present

## 2019-03-13 DIAGNOSIS — J441 Chronic obstructive pulmonary disease with (acute) exacerbation: Secondary | ICD-10-CM | POA: Diagnosis not present

## 2019-03-13 DIAGNOSIS — J9621 Acute and chronic respiratory failure with hypoxia: Secondary | ICD-10-CM | POA: Diagnosis not present

## 2019-03-13 DIAGNOSIS — A419 Sepsis, unspecified organism: Secondary | ICD-10-CM | POA: Diagnosis not present

## 2019-03-13 DIAGNOSIS — G9341 Metabolic encephalopathy: Secondary | ICD-10-CM | POA: Diagnosis not present

## 2019-03-14 DIAGNOSIS — A419 Sepsis, unspecified organism: Secondary | ICD-10-CM | POA: Diagnosis not present

## 2019-03-14 DIAGNOSIS — J441 Chronic obstructive pulmonary disease with (acute) exacerbation: Secondary | ICD-10-CM | POA: Diagnosis not present

## 2019-03-14 DIAGNOSIS — N390 Urinary tract infection, site not specified: Secondary | ICD-10-CM | POA: Diagnosis not present

## 2019-03-14 DIAGNOSIS — G9341 Metabolic encephalopathy: Secondary | ICD-10-CM | POA: Diagnosis not present

## 2019-03-14 DIAGNOSIS — J9621 Acute and chronic respiratory failure with hypoxia: Secondary | ICD-10-CM | POA: Diagnosis not present

## 2019-03-14 DIAGNOSIS — B961 Klebsiella pneumoniae [K. pneumoniae] as the cause of diseases classified elsewhere: Secondary | ICD-10-CM | POA: Diagnosis not present

## 2019-03-18 ENCOUNTER — Other Ambulatory Visit: Payer: Self-pay | Admitting: Gastroenterology

## 2019-03-18 ENCOUNTER — Other Ambulatory Visit: Payer: Self-pay | Admitting: Internal Medicine

## 2019-03-19 DIAGNOSIS — A419 Sepsis, unspecified organism: Secondary | ICD-10-CM | POA: Diagnosis not present

## 2019-03-19 DIAGNOSIS — J9621 Acute and chronic respiratory failure with hypoxia: Secondary | ICD-10-CM | POA: Diagnosis not present

## 2019-03-19 DIAGNOSIS — B961 Klebsiella pneumoniae [K. pneumoniae] as the cause of diseases classified elsewhere: Secondary | ICD-10-CM | POA: Diagnosis not present

## 2019-03-19 DIAGNOSIS — J441 Chronic obstructive pulmonary disease with (acute) exacerbation: Secondary | ICD-10-CM | POA: Diagnosis not present

## 2019-03-19 DIAGNOSIS — G9341 Metabolic encephalopathy: Secondary | ICD-10-CM | POA: Diagnosis not present

## 2019-03-19 DIAGNOSIS — N390 Urinary tract infection, site not specified: Secondary | ICD-10-CM | POA: Diagnosis not present

## 2019-03-21 DIAGNOSIS — B961 Klebsiella pneumoniae [K. pneumoniae] as the cause of diseases classified elsewhere: Secondary | ICD-10-CM | POA: Diagnosis not present

## 2019-03-21 DIAGNOSIS — A419 Sepsis, unspecified organism: Secondary | ICD-10-CM | POA: Diagnosis not present

## 2019-03-21 DIAGNOSIS — N390 Urinary tract infection, site not specified: Secondary | ICD-10-CM | POA: Diagnosis not present

## 2019-03-21 DIAGNOSIS — G9341 Metabolic encephalopathy: Secondary | ICD-10-CM | POA: Diagnosis not present

## 2019-03-21 DIAGNOSIS — J441 Chronic obstructive pulmonary disease with (acute) exacerbation: Secondary | ICD-10-CM | POA: Diagnosis not present

## 2019-03-21 DIAGNOSIS — J9621 Acute and chronic respiratory failure with hypoxia: Secondary | ICD-10-CM | POA: Diagnosis not present

## 2019-03-26 ENCOUNTER — Telehealth: Payer: Self-pay

## 2019-03-26 ENCOUNTER — Ambulatory Visit (INDEPENDENT_AMBULATORY_CARE_PROVIDER_SITE_OTHER): Payer: Medicare Other | Admitting: Family

## 2019-03-26 DIAGNOSIS — N390 Urinary tract infection, site not specified: Secondary | ICD-10-CM

## 2019-03-26 DIAGNOSIS — J441 Chronic obstructive pulmonary disease with (acute) exacerbation: Secondary | ICD-10-CM | POA: Diagnosis not present

## 2019-03-26 DIAGNOSIS — A419 Sepsis, unspecified organism: Secondary | ICD-10-CM | POA: Diagnosis not present

## 2019-03-26 DIAGNOSIS — B961 Klebsiella pneumoniae [K. pneumoniae] as the cause of diseases classified elsewhere: Secondary | ICD-10-CM | POA: Diagnosis not present

## 2019-03-26 DIAGNOSIS — J9621 Acute and chronic respiratory failure with hypoxia: Secondary | ICD-10-CM | POA: Diagnosis not present

## 2019-03-26 DIAGNOSIS — G9341 Metabolic encephalopathy: Secondary | ICD-10-CM | POA: Diagnosis not present

## 2019-03-26 MED ORDER — NITROFURANTOIN MONOHYD MACRO 100 MG PO CAPS: 100.0000 mg | ORAL_CAPSULE | Freq: Two times a day (BID) | ORAL | 0 refills | Status: DC

## 2019-03-26 NOTE — Progress Notes (Signed)
Kayla Thompson is a 84 y.o. female with the following history as recorded in EpicCare:  Patient Active Problem List   Diagnosis Date Noted  . Candida esophagitis ([place]) 02/14/2019  . UTI (urinary tract infection) 01/25/2019  . Acute lower UTI 01/25/2019  . Acute metabolic encephalopathy 99991111  . Sepsis ([place]) 01/25/2019  . Encounter for general adult medical examination with abnormal findings 01/09/2019  . Abnormal barium swallow 12/28/2018  . Throat clearing 12/28/2018  . History of colonic polyps 12/28/2018  . Dysphagia 12/28/2018  . Diabetic foot ulcer ([place]) 12/05/2018  . Rotator cuff arthropathy, left 11/16/2018  . Degenerative arthritis of left knee 11/16/2018  . Insomnia 09/26/2018  . Leukocytosis 10/17/2017  . Malnutrition of moderate degree 10/10/2017  . Memory changes 07/29/2017  . Granulomatous lung disease ([place]) 06/14/2017  . Tracheobronchomalacia 06/14/2017  . Dizziness 03/04/2017  . Exercise hypoxemia 07/12/2016  . Diastolic CHF, chronic ([place]) 12/25/2015  . GERD (gastroesophageal reflux disease) 12/25/2015  . Epidermal inclusion cyst 09/02/2015  . Type 2 diabetes mellitus with hyperglycemia, with long-term current use of insulin ([place]) 03/10/2015  . Muscle cramps 03/06/2015  . Bronchiectasis without acute exacerbation ([place]) 01/07/2015  . PMR (polymyalgia rheumatica) ([place]) 01/07/2015  . Osteoarthritis 01/07/2015  . Asthma, chronic 11/12/2014  . Anxiety state 11/12/2014  . Hyperparathyroidism ([place]) 10/28/2014  . Fibromyalgia 09/21/2014  . Personal history of ovarian cancer 08/23/2014  . Osteoporosis 08/23/2014  . Hypertension 08/02/2014  . HLD (hyperlipidemia) 08/02/2014  . Hypothyroidism 08/02/2014    Current Outpatient Medications  Medication Sig Dispense Refill  . albuterol (PROVENTIL HFA;VENTOLIN HFA) 108 (90 Base) MCG/ACT inhaler Inhale 2 puffs into the lungs every 6 (six) hours as needed for wheezing or shortness of breath. 1 Inhaler 11  .  amitriptyline (ELAVIL) 10 MG tablet Take 1 tablet (10 mg total) by mouth at bedtime. 90 tablet 3  . baclofen (LIORESAL) 10 MG tablet TAKE 0.5-1 TABLET BY MOUTH TWICE DAILY AS NEEDED FOR MUSCLE SPASMS OR PAIN 180 tablet 1  . busPIRone (BUSPAR) 5 MG tablet TAKE 1 TO 2 TABLETS BY MOUTH TWICE A DAY (Patient taking differently: Take 5-10 mg by mouth daily as needed (anxiety). ) 360 tablet 1  . calcium carbonate (OSCAL) 1500 (600 Ca) MG TABS tablet Take 1,500 mg by mouth 2 (two) times daily with a meal.     . carvedilol (COREG) 3.125 MG tablet Take 3.125 mg by mouth 2 (two) times daily with a meal.    . Cholecalciferol (VITAMIN D3) 2000 units TABS Take 2 tablets by mouth daily.     . DULoxetine (CYMBALTA) 30 MG capsule TAKE 1 CAPSULE BY MOUTH EVERY DAY (Patient taking differently: Take 30 mg by mouth daily. ) 90 capsule 0  . DULoxetine (CYMBALTA) 60 MG capsule Take 1 capsule (60 mg total) by mouth daily. 90 capsule 3  . Fluticasone-Umeclidin-Vilant (TRELEGY ELLIPTA) 100-62.5-25 MCG/INH AEPB Inhale 1 puff into the lungs daily. Rinse mouth 60 each 12  . levothyroxine (SYNTHROID) 50 MCG tablet TAKE 1 TABLET BY MOUTH EVERY DAY BEFORE BREAKFAST (Patient taking differently: Take 50 mcg by mouth daily before breakfast. ) 90 tablet 1  . magnesium oxide (MAG-OX) 400 MG tablet TAKE 1 TABLET BY MOUTH EVERY DAY 90 tablet 1  . montelukast (SINGULAIR) 10 MG tablet TAKE 1 TABLET BY MOUTH EVERY DAY 90 tablet 2  . Multiple Vitamins-Minerals (CENTRUM ADULTS PO) Take 1 tablet by mouth daily.     Marland Kitchen omeprazole (PRILOSEC) 20 MG capsule Take 1 capsule (20  mg total) by mouth 2 (two) times daily before a meal. 60 capsule 3  . Polyethyl Glycol-Propyl Glycol (SYSTANE FREE OP) Apply 1 drop to eye 3 (three) times daily.    . predniSONE (DELTASONE) 1 MG tablet Take 2 tablets (2 mg total) by mouth daily. 60 tablet 1  . rosuvastatin (CRESTOR) 10 MG tablet TAKE 1 TABLET BY MOUTH EVERY DAY IN THE EVENING 90 tablet 2  . traMADol (ULTRAM)  50 MG tablet Take 1 tablet (50 mg total) by mouth at bedtime as needed for moderate pain. 20 tablet 0  . BD PEN NEEDLE NANO U/F 32G X 4 MM MISC USE 4 TIMES A DAY 400 each 1  . Dextromethorphan-Guaifenesin (MUCINEX DM MAXIMUM STRENGTH) 60-1200 MG TB12 Take 1 tablet by mouth 2 (two) times daily. Reported on 06/23/2015    . Fluticasone-Umeclidin-Vilant (TRELEGY ELLIPTA) 100-62.5-25 MCG/INH AEPB Inhale 1 puff into the lungs daily. 2 each 0  . glucose blood (FREESTYLE TEST STRIPS) test strip Use to test blood sugar 3 times daily. Dx: E11.65 100 each 3  . Insulin Glargine (LANTUS SOLOSTAR) 100 UNIT/ML Solostar Pen Inject 20 Units into the skin every morning. (Patient taking differently: Inject 15 Units into the skin every morning. ) 27 pen 1  . Lancets (FREESTYLE) lancets Use to test blood sugar 3 times daily. Dx: E11.65 100 each 3  . nitrofurantoin, macrocrystal-monohydrate, (MACROBID) 100 MG capsule Take 1 capsule (100 mg total) by mouth 2 (two) times daily. 14 capsule 0  . UNABLE TO FIND Take 1,000 mg by mouth daily. Med Name: Tumeric    . UNABLE TO FIND Take 1,000 mg by mouth daily. Med Name: Zannie Cove    . vitamin B-12 (CYANOCOBALAMIN) 250 MCG tablet Take 250 mcg by mouth See admin instructions. Twice weekly     No current facility-administered medications for this visit.    Allergies: Latex and Penicillins  Past Medical History:  Diagnosis Date  . Anxiety   . Arthritis   . Asthma   . Cataracts, bilateral    removed by surgery  . COPD (chronic obstructive pulmonary disease) ([place])   . Depression   . Diabetes mellitus without complication ([place])    type 2  . Dyspnea    with exertion  . Fibromyalgia   . GERD (gastroesophageal reflux disease)   . History of chemotherapy   . History of fractured pelvis   . Hypertension   . Hypothyroidism   . Osteoporosis 12/2017   T score -2.8 stable from prior DEXA  . Osteoporosis   . Ovarian cancer ([place])   . Pneumonia    several times    Past  Surgical History:  Procedure Laterality Date  . ABDOMINAL HYSTERECTOMY  1996  . ANKLE FRACTURE SURGERY     plate and screws  . BIOPSY  02/05/2019   Procedure: BIOPSY;  Surgeon: Rush Landmark Telford Nab., MD;  Location: [facility];  Service: Gastroenterology;;  . BLADDER SUSPENSION    . CATARACT EXTRACTION    . COLONOSCOPY    . ESOPHAGEAL BRUSHING  02/05/2019   Procedure: ESOPHAGEAL BRUSHING;  Surgeon: Rush Landmark Telford Nab., MD;  Location: [facility];  Service: Gastroenterology;;  . ESOPHAGOGASTRODUODENOSCOPY (EGD) WITH PROPOFOL N/A 02/05/2019   Procedure: ESOPHAGOGASTRODUODENOSCOPY (EGD) WITH PROPOFOL with dialtion;  Surgeon: Irving Copas., MD;  Location: [facility];  Service: Gastroenterology;  Laterality: N/A;  . EYE SURGERY     cataracts removed-bilateral  . hip surgey    . knee surgey    . LUNG  SURGERY    . OOPHORECTOMY     BSO  . SAVORY DILATION N/A 02/05/2019   Procedure: SAVORY DILATION;  Surgeon: Rush Landmark Telford Nab., MD;  Location: [facility];  Service: Gastroenterology;  Laterality: N/A;  . TONSILLECTOMY    . UPPER GI ENDOSCOPY      Family History  Problem Relation Age of Onset  . Lung cancer Mother   . CAD Father   . Diabetes Father   . Lung cancer Maternal Aunt   . Diabetes Paternal Aunt   . Diabetes Paternal Uncle   . Diabetes Paternal Grandmother   . Diabetes Paternal Grandfather   . Colon cancer Neg Hx   . Esophageal cancer Neg Hx   . Inflammatory bowel disease Neg Hx   . Liver disease Neg Hx   . Pancreatic cancer Neg Hx   . Rectal cancer Neg Hx   . Stomach cancer Neg Hx     Social History   Tobacco Use  . Smoking status: Never Smoker  . Smokeless tobacco: Never Used  Substance Use Topics  . Alcohol use: No    Alcohol/week: 0.0 standard drinks    Subjective:    I connected with [name] on 03/26/19 at  3:20 PM EST by a telephone call  and verified that I am speaking with the correct person using two identifiers.   I  discussed the limitations of evaluation and management by telemedicine and the availability of in person appointments. The patient expressed understanding and agreed to proceed.  Patient has concerns for UTI; complaining of frequency, pain with urination and urgency; did a "home test" and was positive for UTI; denies any fever or blood in urine; no back pain;    Objective:  There were no vitals filed for this visit.  Lungs: Respirations unlabored;  Neurologic: Alert and oriented; speech intact;   Assessment:  1. Acute lower UTI     Plan:  Will treat based on symptoms; Rx for Macrobid 100 mg bid x 7 days; increase fluids and follow-up if symptoms worse, no better.  Time spent 8 minutes;   No follow-ups on file.  No orders of the defined types were placed in this encounter.   Requested Prescriptions   Signed Prescriptions Disp Refills  . nitrofurantoin, macrocrystal-monohydrate, (MACROBID) 100 MG capsule 14 capsule 0    Sig: Take 1 capsule (100 mg total) by mouth 2 (two) times daily.

## 2019-03-26 NOTE — Telephone Encounter (Signed)
Spoke with patient's daughter. Per daughter, her insurance is staying the same so benefits will remain $0 copay.  They will call back to reschedule.

## 2019-03-26 NOTE — Telephone Encounter (Signed)
Estell Harpin daughter [phone].  Calling to find out when pt should schedule next prolia inj.  Please advise.

## 2019-03-26 NOTE — Telephone Encounter (Signed)
Copied from [place] [id number]. Topic: General - Inquiry >> Mar 26, 2019  8:21 AM Richardo Priest, NT wrote: Reason for CRM: [name] called in stating mother is having UTI symptoms, odor change and incontinence. [name] did at home test and came back positive twice. [name] would like to have an antibiotic called in to [facility] on [address].  Please advise and call back is [phone].   F/U   Spoke with Daughter - virtual visit with Jodi Mourning today

## 2019-03-27 ENCOUNTER — Ambulatory Visit (INDEPENDENT_AMBULATORY_CARE_PROVIDER_SITE_OTHER): Payer: Medicare Other | Admitting: Gastroenterology

## 2019-03-27 ENCOUNTER — Encounter: Payer: Self-pay | Admitting: Gastroenterology

## 2019-03-27 ENCOUNTER — Other Ambulatory Visit (INDEPENDENT_AMBULATORY_CARE_PROVIDER_SITE_OTHER): Payer: Medicare Other

## 2019-03-27 VITALS — BP 120/78 | HR 108 | Temp 98.7°F | Ht 59.0 in | Wt 140.8 lb

## 2019-03-27 DIAGNOSIS — Z8719 Personal history of other diseases of the digestive system: Secondary | ICD-10-CM

## 2019-03-27 DIAGNOSIS — B3781 Candidal esophagitis: Secondary | ICD-10-CM

## 2019-03-27 DIAGNOSIS — Q394 Esophageal web: Secondary | ICD-10-CM | POA: Insufficient documentation

## 2019-03-27 DIAGNOSIS — R1310 Dysphagia, unspecified: Secondary | ICD-10-CM

## 2019-03-27 DIAGNOSIS — D508 Other iron deficiency anemias: Secondary | ICD-10-CM

## 2019-03-27 DIAGNOSIS — R1319 Other dysphagia: Secondary | ICD-10-CM

## 2019-03-27 DIAGNOSIS — D509 Iron deficiency anemia, unspecified: Secondary | ICD-10-CM | POA: Insufficient documentation

## 2019-03-27 LAB — IBC + FERRITIN
Ferritin: 31.2 ng/mL (ref 10.0–291.0)
Iron: 56 ug/dL (ref 42–145)
Saturation Ratios: 15.5 % — ABNORMAL LOW (ref 20.0–50.0)
Transferrin: 258 mg/dL (ref 212.0–360.0)

## 2019-03-27 LAB — CBC
HCT: 35.5 % — ABNORMAL LOW (ref 36.0–46.0)
Hemoglobin: 11.5 g/dL — ABNORMAL LOW (ref 12.0–15.0)
MCHC: 32.3 g/dL (ref 30.0–36.0)
MCV: 100.2 fl — ABNORMAL HIGH (ref 78.0–100.0)
Platelets: 393 10*3/uL (ref 150.0–400.0)
RBC: 3.54 Mil/uL — ABNORMAL LOW (ref 3.87–5.11)
RDW: 15.2 % (ref 11.5–15.5)
WBC: 12.8 10*3/uL — ABNORMAL HIGH (ref 4.0–10.5)

## 2019-03-27 MED ORDER — FERROUS GLUCONATE 324 (38 FE) MG PO TABS: 324.0000 mg | ORAL_TABLET | Freq: Every day | ORAL | 2 refills | Status: DC

## 2019-03-27 MED ORDER — OMEPRAZOLE 20 MG PO CPDR: 20.0000 mg | DELAYED_RELEASE_CAPSULE | Freq: Two times a day (BID) | ORAL | 3 refills | Status: DC

## 2019-03-27 NOTE — Patient Instructions (Signed)
Your provider has requested that you go to the basement level for lab work before leaving today. Press "B" on the elevator. The lab is located at the first door on the left as you exit the elevator.  We have sent the following medications to your pharmacy for you to pick up at your convenience:  Omeprazole - decrease to 20mg  twice daily until your next office visit in 2 months.   Start Ferrous Gluconate 324mg  - once daily.    If you are age 82 or younger, your body mass index should be between 19-25. Your Body mass index is 28.44 kg/m. If this is out of the aformentioned range listed, please consider follow up with your Primary Care Provider.   Thank you for choosing me and [facility].  Dr. Rush Landmark

## 2019-03-27 NOTE — Progress Notes (Signed)
[facility] VISIT   Primary Care Provider [name], MD [place] [address] [phone]  Patient Profile: Kayla Thompson is a 84 y.o. female with a pmh significant for diabetes, asthma, arthritis, osteoporosis, prior ovarian cancer, fibromyalgia, hypertension, GERD, hiatal hernia, UES stenosis, prior Candida Esophagitis.  The patient presents to the [facility] for an evaluation and management of problem(s) noted below:  Problem List 1. History of Candida esophagitis ([place])   2. Upper esophageal web   3. Esophageal dysphagia   4. Iron deficiency anemia, unspecified iron deficiency anemia type     History of Present Illness Please see initial consultation note for full details of HPI.  Interval History The patient underwent an upper endoscopy with dilation and findings of Candida esophagitis in November 2020.  Patient was asked to continue twice daily PPI 40 mg until 4 weeks post procedure and subsequently go back to 20 twice daily.  Today, the patient returns for scheduled follow-up and is with one of her caregivers.  Feels well.  She is approximately 75 to 80% better than where she was when she first met [place].  She is very happy with that.  However, the patient does describe having still intermittent episodes of solid food dysphagia.  She recalls approximately 2 to 3 weeks ago having a sensation in her throat for approximately 3 to 4 hours of something being caught.  She had eaten a very large piece of hamburger that was not well cooked and eventually was able to have it passed down.  With that being said again, her symptoms have overall significantly improved.  She completed her treatment for Candida esophagitis.  She is currently still taking 40 mg twice daily PPI.  She describes no odynophagia.  She has had some transitions of her inhaled corticosteroids by pulmonary.  Patient describes no significant abdominal  pain.  She has no changes in her bowel habits.  She was found to have iron deficiency anemia and is not taking iron supplementation as of yet.  Her last colonoscopy was reported in [place] in the last 5 to 7 years but we were unable to obtain the results of this procedure.  GI Review of Systems Positive as above Negative for odynophagia, nausea, vomiting, change in bowel habits, weight loss  Review of Systems General: Denies fevers/chills/weight loss HEENT: Denies oral lesions or thrush Cardiovascular: Denies chest pain/palpitations Pulmonary: Denies shortness of breath/nocturnal cough Gastroenterological: See HPI Genitourinary: Denies darkened urine Hematological: Denies easy bruising/bleeding Dermatological: Denies jaundice Psychological: Mood is stable   Medications Current Outpatient Medications  Medication Sig Dispense Refill  . albuterol (PROVENTIL HFA;VENTOLIN HFA) 108 (90 Base) MCG/ACT inhaler Inhale 2 puffs into the lungs every 6 (six) hours as needed for wheezing or shortness of breath. 1 Inhaler 11  . amitriptyline (ELAVIL) 10 MG tablet Take 1 tablet (10 mg total) by mouth at bedtime. 90 tablet 3  . baclofen (LIORESAL) 10 MG tablet TAKE 0.5-1 TABLET BY MOUTH TWICE DAILY AS NEEDED FOR MUSCLE SPASMS OR PAIN 180 tablet 1  . BD PEN NEEDLE NANO U/F 32G X 4 MM MISC USE 4 TIMES A DAY 400 each 1  . busPIRone (BUSPAR) 5 MG tablet TAKE 1 TO 2 TABLETS BY MOUTH TWICE A DAY (Patient taking differently: Take 5-10 mg by mouth daily as needed (anxiety). ) 360 tablet 1  . calcium carbonate (OSCAL) 1500 (600 Ca) MG TABS tablet Take 1,500 mg by mouth 2 (two) times daily  with a meal.     . carvedilol (COREG) 3.125 MG tablet Take 3.125 mg by mouth 2 (two) times daily with a meal.    . Cholecalciferol (VITAMIN D3) 2000 units TABS Take 2 tablets by mouth daily.     Marland Kitchen Dextromethorphan-Guaifenesin (MUCINEX DM MAXIMUM STRENGTH) 60-1200 MG TB12 Take 1 tablet by mouth 2 (two) times daily. Reported on  06/23/2015    . DULoxetine (CYMBALTA) 30 MG capsule TAKE 1 CAPSULE BY MOUTH EVERY DAY (Patient taking differently: Take 30 mg by mouth daily. Takes 30 mg once daily then 60 mg once daily) 90 capsule 0  . DULoxetine (CYMBALTA) 60 MG capsule Take 1 capsule (60 mg total) by mouth daily. 90 capsule 3  . Fluticasone-Umeclidin-Vilant (TRELEGY ELLIPTA) 100-62.5-25 MCG/INH AEPB Inhale 1 puff into the lungs daily. Rinse mouth 60 each 12  . glucose blood (FREESTYLE TEST STRIPS) test strip Use to test blood sugar 3 times daily. Dx: E11.65 100 each 3  . Insulin Glargine (LANTUS SOLOSTAR) 100 UNIT/ML Solostar Pen Inject 20 Units into the skin every morning. (Patient taking differently: Inject 15 Units into the skin every morning. ) 27 pen 1  . Lancets (FREESTYLE) lancets Use to test blood sugar 3 times daily. Dx: E11.65 100 each 3  . levothyroxine (SYNTHROID) 50 MCG tablet TAKE 1 TABLET BY MOUTH EVERY DAY BEFORE BREAKFAST (Patient taking differently: Take 50 mcg by mouth daily before breakfast. ) 90 tablet 1  . magnesium oxide (MAG-OX) 400 MG tablet TAKE 1 TABLET BY MOUTH EVERY DAY 90 tablet 1  . Misc Natural Products (TART CHERRY ADVANCED PO) Take by mouth. Once daily    . montelukast (SINGULAIR) 10 MG tablet TAKE 1 TABLET BY MOUTH EVERY DAY 90 tablet 2  . Multiple Vitamins-Minerals (CENTRUM ADULTS PO) Take 1 tablet by mouth daily.     . nitrofurantoin, macrocrystal-monohydrate, (MACROBID) 100 MG capsule Take 1 capsule (100 mg total) by mouth 2 (two) times daily. 14 capsule 0  . omeprazole (PRILOSEC) 20 MG capsule Take 1 capsule (20 mg total) by mouth 2 (two) times daily before a meal. 60 capsule 3  . Polyethyl Glycol-Propyl Glycol (SYSTANE FREE OP) Apply 1 drop to eye 3 (three) times daily.    . predniSONE (DELTASONE) 1 MG tablet Take 2 tablets (2 mg total) by mouth daily. 60 tablet 1  . rosuvastatin (CRESTOR) 10 MG tablet TAKE 1 TABLET BY MOUTH EVERY DAY IN THE EVENING 90 tablet 2  . traMADol (ULTRAM) 50 MG  tablet Take 1 tablet (50 mg total) by mouth at bedtime as needed for moderate pain. 20 tablet 0  . Turmeric (QC TUMERIC COMPLEX PO) Take by mouth.    Marland Kitchen UNABLE TO FIND Take 1,000 mg by mouth daily. Med Name: Tumeric    . UNABLE TO FIND Take 1,000 mg by mouth daily. Med Name: [place]annie [place]    . vitamin B-12 (CYANOCOBALAMIN) 250 MCG tablet Take 250 mcg by mouth See admin instructions. Twice weekly    . ferrous gluconate (FERGON) 324 MG tablet Take 1 tablet (324 mg total) by mouth daily with breakfast. 30 tablet 2   No current facility-administered medications for this visit.    Allergies Allergies  Allergen Reactions  . Latex Rash  . Penicillins Rash    Has patient had a PCN reaction causing immediate rash, facial/tongue/throat swelling, SOB or lightheadedness with hypotension: No Has patient had a PCN reaction causing severe rash involving mucus membranes or skin necrosis: No Has patient had a PCN  reaction that required hospitalization: No Has patient had a PCN reaction occurring within the last 10 years: No If all of the above answers are "NO", then may proceed with Cephalosporin use.     Histories Past Medical History:  Diagnosis Date  . Anxiety   . Arthritis   . Asthma   . Cataracts, bilateral    removed by surgery  . COPD (chronic obstructive pulmonary disease) ([place])   . Depression   . Diabetes mellitus without complication ([place])    type 2  . Dyspnea    with exertion  . Fibromyalgia   . GERD (gastroesophageal reflux disease)   . History of chemotherapy   . History of fractured pelvis   . Hypertension   . Hypothyroidism   . Osteoporosis 12/2017   T score -2.8 stable from prior DEXA  . Osteoporosis   . Ovarian cancer ([place])   . Pneumonia    several times   Past Surgical History:  Procedure Laterality Date  . ABDOMINAL HYSTERECTOMY  1996  . ANKLE FRACTURE SURGERY     plate and screws  . BIOPSY  02/05/2019   Procedure: BIOPSY;  Surgeon: Rush Landmark Telford Nab., MD;   Location: [facility];  Service: Gastroenterology;;  . BLADDER SUSPENSION    . CATARACT EXTRACTION    . COLONOSCOPY    . ESOPHAGEAL BRUSHING  02/05/2019   Procedure: ESOPHAGEAL BRUSHING;  Surgeon: Rush Landmark Telford Nab., MD;  Location: [facility];  Service: Gastroenterology;;  . ESOPHAGOGASTRODUODENOSCOPY (EGD) WITH PROPOFOL N/A 02/05/2019   Procedure: ESOPHAGOGASTRODUODENOSCOPY (EGD) WITH PROPOFOL with dialtion;  Surgeon: Irving Copas., MD;  Location: [facility];  Service: Gastroenterology;  Laterality: N/A;  . EYE SURGERY     cataracts removed-bilateral  . hip surgey    . knee surgey    . LUNG SURGERY    . OOPHORECTOMY     BSO  . SAVORY DILATION N/A 02/05/2019   Procedure: SAVORY DILATION;  Surgeon: Rush Landmark Telford Nab., MD;  Location: [facility];  Service: Gastroenterology;  Laterality: N/A;  . TONSILLECTOMY    . UPPER GI ENDOSCOPY     Social History   Socioeconomic History  . Marital status: Widowed    Spouse name: Not on file  . Number of children: 2  . Years of education: Not on file  . Highest education level: Not on file  Occupational History  . Not on file  Tobacco Use  . Smoking status: Never Smoker  . Smokeless tobacco: Never Used  Substance and Sexual Activity  . Alcohol use: No    Alcohol/week: 0.0 standard drinks  . Drug use: No  . Sexual activity: Not Currently    Birth control/protection: Post-menopausal    Comment: declined sexual Hx questions  Other Topics Concern  . Not on file  Social History Narrative  . Not on file   Social Determinants of Health   Financial Resource Strain:   . Difficulty of Paying Living Expenses: Not on file  Food Insecurity:   . Worried About Charity fundraiser in the Last Year: Not on file  . Ran Out of Food in the Last Year: Not on file  Transportation Needs:   . Lack of Transportation (Medical): Not on file  . Lack of Transportation (Non-Medical): Not on file  Physical Activity:   . Days of  Exercise per Week: Not on file  . Minutes of Exercise per Session: Not on file  Stress:   . Feeling of Stress : Not on file  Social Connections:   . Frequency of Communication with Friends and Family: Not on file  . Frequency of Social Gatherings with Friends and Family: Not on file  . Attends Religious Services: Not on file  . Active Member of Clubs or Organizations: Not on file  . Attends Archivist Meetings: Not on file  . Marital Status: Not on file  Intimate Partner Violence:   . Fear of Current or Ex-Partner: Not on file  . Emotionally Abused: Not on file  . Physically Abused: Not on file  . Sexually Abused: Not on file   Family History  Problem Relation Age of Onset  . Lung cancer Mother   . CAD Father   . Diabetes Father   . Lung cancer Maternal Aunt   . Diabetes Paternal Aunt   . Diabetes Paternal Uncle   . Diabetes Paternal Grandmother   . Diabetes Paternal Grandfather   . Colon cancer Neg Hx   . Esophageal cancer Neg Hx   . Inflammatory bowel disease Neg Hx   . Liver disease Neg Hx   . Pancreatic cancer Neg Hx   . Rectal cancer Neg Hx   . Stomach cancer Neg Hx    I have reviewed her medical, social, and family history in detail and updated the electronic medical record as necessary.    PHYSICAL EXAMINATION  BP 120/78 (BP Location: Left Arm, Patient Position: Sitting)   Pulse (!) 108   Temp 98.7 F (37.1 C)   Ht '4\' 11"'$  (1.499 m)   Wt 140 lb 12.8 oz (63.9 kg)   LMP  (LMP Unknown)   SpO2 96%   BMI 28.44 kg/m  Wt Readings from Last 3 Encounters:  03/27/19 140 lb 12.8 oz (63.9 kg)  02/26/19 140 lb (63.5 kg)  02/13/19 142 lb (64.4 kg)  GEN: NAD, appears stated age, doesn't appear chronically ill, accompanied by caregiver PSYCH: Cooperative, without pressured speech EYE: Conjunctivae pink, sclerae anicteric ENT: MMM, without oral ulcers, no thrush, no erythema or exudates noted NECK: Supple CV: Nontachycardic without R/Gs RESP: No wheezing  appreciated today GI: NABS, soft, NT/ND, without rebound or guarding, no HSM appreciated MSK/EXT: Trace bilateral lower extremity edema SKIN: No jaundice NEURO:  Alert & Oriented x 3, no focal deficits   REVIEW OF DATA  I reviewed the following data at the time of this encounter:  GI Procedures and Studies  Reported colonoscopy within the last 5 to 7 years done in [place] but patient unaware of who did it  November 2020 EGD - White nummular lesions in esophageal mucosa - query Candida. Cells for cytology obtained. Biopsied. - Dilation performed in the entire esophagus. Mucosal rent noted at UES. - 4 cm hiatal hernia. - Erythematous mucosa in the antrum. Biopsied. - No gross lesions in the duodenal bulb, in the first portion of the duodenum and in the second portion of the duodenum. Pathology FINAL MICROSCOPIC DIAGNOSIS:  - No malignant cells identified  - Benign reactive/reparative changes  MICROORGANISMS:  Fungal organisms present consistent with candida spp. FINAL MICROSCOPIC DIAGNOSIS:  A. STOMACH, BIOPSY:  - Mild chronic gastritis.  - Warthin-Starry is negative for Helicobacter pylori.  - No intestinal metaplasia, dysplasia, or malignancy.  B. ESOPHAGUS, BIOPSY:  - Candida esophagitis.  - No intestinal metaplasia, dysplasia, or malignancy.   Laboratory Studies  Reviewed those in epic  Imaging Studies  No new imaging studies to review   ASSESSMENT  Ms. Luse is a 84 y.o. female with a pmh  significant for diabetes, asthma, arthritis, osteoporosis, prior ovarian cancer, fibromyalgia, hypertension, GERD, hiatal hernia, UES stenosis, prior Candida Esophagitis.  The patient is seen today for evaluation and management of:  1. History of Candida esophagitis ([place])   2. Upper esophageal web   3. Esophageal dysphagia   4. Iron deficiency anemia, unspecified iron deficiency anemia type    The patient is hemodynamically and clinically stable.  Overall, the patient has done  well post procedure from her endoscopy with findings of Candida esophagitis status post treatment as well as empiric dilation with finding of a UES stenosis/web which was disrupted.  The patient still has infrequent episodes of dysphagia but overall is doing well.  We are not noting any new red flag symptoms.  I went through with the patient signs to be on the look out for such that her dysphagia were to worsen or she begins to have more significant episodes.  If this were the case, I would recommend that we repeat upper endoscopy to ensure that she has not had recurrent Candida esophagitis per disease she is at risk of this as a result of her inhaled corticosteroid use).  Also for plan a repeat dilation of the UES.  If she continued to have symptoms thereafter, I would plan for her to undergo an esophageal manometry.  I am going to transition her back to 20 mg twice daily omeprazole and in effort of trying to minimize her PPI use and medications overall.  If she is doing well in 2 to 3 months we may even go down to 20 mg once daily.  She had evidence of iron deficiency anemia on her last check in November and she has not been on any oral iron supplementation.  We will recheck her labs and initiate her on iron.  If she continues to have iron deficiency, I think she needs to undergo a colonoscopy for further evaluation of the iron deficiency anemia.  All patient questions were answered, to the best of my ability, and the patient agrees to the aforementioned plan of action with follow-up as indicated.   PLAN  Transition back to omeprazole 20 mg twice daily for at least next 2 months with potential down titration thereafter Laboratories as outlined below to evaluate for iron deficiency Ferrous gluconate 324 mg daily to be initiated If patient still has evidence of iron deficiency anemia with plan for diagnostic colonoscopy If patient's symptoms recur would plan to repeat EGD with dilation to ensure no recurrent  Candida and repeat dilation of UES stricture/web Thereafter would consider manometry Would not plan for hiatal hernia repair unless we truly delineate that she does not have any evidence of a dysmotility of the esophagus    Orders Placed This Encounter  Procedures  . CBC  . IBC + Ferritin    New Prescriptions   FERROUS GLUCONATE (FERGON) 324 MG TABLET    Take 1 tablet (324 mg total) by mouth daily with breakfast.   Modified Medications   Modified Medication Previous Medication   OMEPRAZOLE (PRILOSEC) 20 MG CAPSULE omeprazole (PRILOSEC) 20 MG capsule      Take 1 capsule (20 mg total) by mouth 2 (two) times daily before a meal.    Take 1 capsule (20 mg total) by mouth 2 (two) times daily before a meal.    Planned Follow Up Return in about 2 months (around 05/25/2019).   Total Time in Face-to-Face and in Coordination of Care for patient including review/personal interpretation of prior testing,  medical history, examination, medication adjustment, documentation with the EHR is 30 minutes.   Justice Britain, MD [facility] Office # [phone]

## 2019-03-28 ENCOUNTER — Telehealth: Payer: Self-pay | Admitting: Internal Medicine

## 2019-03-28 ENCOUNTER — Other Ambulatory Visit: Payer: Self-pay | Admitting: Gastroenterology

## 2019-03-28 ENCOUNTER — Other Ambulatory Visit: Payer: Self-pay

## 2019-03-28 DIAGNOSIS — J9621 Acute and chronic respiratory failure with hypoxia: Secondary | ICD-10-CM | POA: Diagnosis not present

## 2019-03-28 DIAGNOSIS — D509 Iron deficiency anemia, unspecified: Secondary | ICD-10-CM

## 2019-03-28 DIAGNOSIS — N390 Urinary tract infection, site not specified: Secondary | ICD-10-CM | POA: Diagnosis not present

## 2019-03-28 DIAGNOSIS — A419 Sepsis, unspecified organism: Secondary | ICD-10-CM | POA: Diagnosis not present

## 2019-03-28 DIAGNOSIS — J441 Chronic obstructive pulmonary disease with (acute) exacerbation: Secondary | ICD-10-CM | POA: Diagnosis not present

## 2019-03-28 DIAGNOSIS — G9341 Metabolic encephalopathy: Secondary | ICD-10-CM | POA: Diagnosis not present

## 2019-03-28 DIAGNOSIS — R1319 Other dysphagia: Secondary | ICD-10-CM

## 2019-03-28 DIAGNOSIS — B961 Klebsiella pneumoniae [K. pneumoniae] as the cause of diseases classified elsewhere: Secondary | ICD-10-CM | POA: Diagnosis not present

## 2019-03-28 DIAGNOSIS — R1310 Dysphagia, unspecified: Secondary | ICD-10-CM

## 2019-03-28 DIAGNOSIS — B3781 Candidal esophagitis: Secondary | ICD-10-CM

## 2019-03-28 NOTE — Telephone Encounter (Signed)
Home Health Verbal Orders - Caller/Agency: Jerene Bears Number: [phone] Requesting OT/PT/Skilled Nursing/Social Work/Speech Therapy: PT -"letting PCP know that pt is approaching end of third period, because of UTI pt is feeling weak, PT is planning to extend treatment. Will re-certify next week" Please advise

## 2019-03-29 NOTE — Telephone Encounter (Signed)
Fine

## 2019-03-29 NOTE — Telephone Encounter (Signed)
VO's given. 

## 2019-04-04 ENCOUNTER — Other Ambulatory Visit: Payer: Self-pay | Admitting: Internal Medicine

## 2019-04-04 ENCOUNTER — Telehealth: Payer: Self-pay

## 2019-04-04 ENCOUNTER — Telehealth: Payer: Self-pay | Admitting: Internal Medicine

## 2019-04-04 NOTE — Telephone Encounter (Signed)
Copied from [place] [id number]. Topic: General - Inquiry >> Apr 04, 2019 11:31 AM Alease Frame wrote: Reason for CRM: Patients daughter called in would like the pts to have a urine sample at upcoming appt . Please advise

## 2019-04-04 NOTE — Telephone Encounter (Signed)
Rx for montelukast was sent to pharmacy for pt 12/22 with #90 and 2 refills. Called and spoke with pt's daughter [name] letting her know this had been done and she verbalized understanding stating that she would call the pharmacy. Nothing further needed.

## 2019-04-04 NOTE — Telephone Encounter (Signed)
Pt does not have upcoming OV with PCP.

## 2019-04-06 ENCOUNTER — Ambulatory Visit: Payer: Medicare Other

## 2019-04-06 ENCOUNTER — Telehealth: Payer: Self-pay | Admitting: Internal Medicine

## 2019-04-06 ENCOUNTER — Ambulatory Visit (INDEPENDENT_AMBULATORY_CARE_PROVIDER_SITE_OTHER): Payer: Medicare Other | Admitting: Family

## 2019-04-06 ENCOUNTER — Encounter: Payer: Self-pay | Admitting: Family

## 2019-04-06 ENCOUNTER — Other Ambulatory Visit: Payer: Self-pay

## 2019-04-06 VITALS — BP 118/70 | HR 70 | Temp 98.1°F | Resp 16 | Ht 59.0 in | Wt 141.0 lb

## 2019-04-06 DIAGNOSIS — R300 Dysuria: Secondary | ICD-10-CM

## 2019-04-06 DIAGNOSIS — G9341 Metabolic encephalopathy: Secondary | ICD-10-CM | POA: Diagnosis not present

## 2019-04-06 DIAGNOSIS — M810 Age-related osteoporosis without current pathological fracture: Secondary | ICD-10-CM | POA: Diagnosis not present

## 2019-04-06 DIAGNOSIS — B961 Klebsiella pneumoniae [K. pneumoniae] as the cause of diseases classified elsewhere: Secondary | ICD-10-CM | POA: Diagnosis not present

## 2019-04-06 DIAGNOSIS — J441 Chronic obstructive pulmonary disease with (acute) exacerbation: Secondary | ICD-10-CM | POA: Diagnosis not present

## 2019-04-06 DIAGNOSIS — J9621 Acute and chronic respiratory failure with hypoxia: Secondary | ICD-10-CM | POA: Diagnosis not present

## 2019-04-06 DIAGNOSIS — N390 Urinary tract infection, site not specified: Secondary | ICD-10-CM | POA: Diagnosis not present

## 2019-04-06 DIAGNOSIS — A419 Sepsis, unspecified organism: Secondary | ICD-10-CM | POA: Diagnosis not present

## 2019-04-06 LAB — POC URINALSYSI DIPSTICK (AUTOMATED)
Bilirubin, UA: NEGATIVE
Glucose, UA: NEGATIVE
Ketones, UA: NEGATIVE
Protein, UA: POSITIVE — AB
Spec Grav, UA: 1.015 (ref 1.010–1.025)
Urobilinogen, UA: 0.2 E.U./dL
pH, UA: 6 (ref 5.0–8.0)

## 2019-04-06 MED ORDER — SULFAMETHOXAZOLE-TRIMETHOPRIM 800-160 MG PO TABS: 1.0000 | ORAL_TABLET | Freq: Two times a day (BID) | ORAL | 0 refills | Status: DC

## 2019-04-06 MED ORDER — DENOSUMAB 60 MG/ML ~~LOC~~ SOSY
60.0000 mg | PREFILLED_SYRINGE | Freq: Once | SUBCUTANEOUS | Status: AC
  Administered 2019-04-06: 16:00:00 60 mg via SUBCUTANEOUS

## 2019-04-06 NOTE — Telephone Encounter (Signed)
Verbal orders given  

## 2019-04-06 NOTE — Progress Notes (Signed)
Kayla Thompson is a 84 y.o. female with the following history as recorded in Epic[employer]:  Patient Active Problem List   Diagnosis Date Noted  . Upper esophageal web 03/27/2019  . Iron deficiency anemia 03/27/2019  . Candida esophagitis ([place]) 02/14/2019  . UTI (urinary tract infection) 01/25/2019  . Acute lower UTI 01/25/2019  . Acute metabolic encephalopathy 99991111  . Sepsis ([place]) 01/25/2019  . Encounter for general adult medical examination with abnormal findings 01/09/2019  . Abnormal barium swallow 12/28/2018  . Throat clearing 12/28/2018  . History of colonic polyps 12/28/2018  . Dysphagia 12/28/2018  . Diabetic foot ulcer ([place]) 12/05/2018  . Rotator cuff arthropathy, left 11/16/2018  . Degenerative arthritis of left knee 11/16/2018  . Insomnia 09/26/2018  . Leukocytosis 10/17/2017  . Malnutrition of moderate degree 10/10/2017  . Memory changes 07/29/2017  . Granulomatous lung disease ([place]) 06/14/2017  . Tracheobronchomalacia 06/14/2017  . Dizziness 03/04/2017  . Exercise hypoxemia 07/12/2016  . Diastolic CHF, chronic ([place]) 12/25/2015  . GERD (gastroesophageal reflux disease) 12/25/2015  . Epidermal inclusion cyst 09/02/2015  . Type 2 diabetes mellitus with hyperglycemia, with long-term current use of insulin ([place]) 03/10/2015  . Muscle cramps 03/06/2015  . Bronchiectasis without acute exacerbation ([place]) 01/07/2015  . PMR (polymyalgia rheumatica) ([place]) 01/07/2015  . Osteoarthritis 01/07/2015  . Asthma, chronic 11/12/2014  . Anxiety state 11/12/2014  . Hyperparathyroidism ([place]) 10/28/2014  . Fibromyalgia 09/21/2014  . Personal history of ovarian cancer 08/23/2014  . Osteoporosis 08/23/2014  . Hypertension 08/02/2014  . HLD (hyperlipidemia) 08/02/2014  . Hypothyroidism 08/02/2014    Current Outpatient Medications  Medication Sig Dispense Refill  . albuterol (PROVENTIL HFA;VENTOLIN HFA) 108 (90 Base) MCG/ACT inhaler Inhale 2 puffs into the lungs every 6 (six) hours  as needed for wheezing or shortness of breath. 1 Inhaler 11  . amitriptyline (ELAVIL) 10 MG tablet Take 1 tablet (10 mg total) by mouth at bedtime. 90 tablet 3  . baclofen (LIORESAL) 10 MG tablet TAKE 0.5-1 TABLET BY MOUTH TWICE DAILY AS NEEDED FOR MUSCLE SPASMS OR PAIN 180 tablet 1  . BD PEN NEEDLE NANO U/F 32G X 4 MM MISC USE 4 TIMES A DAY 400 each 1  . busPIRone (BUSPAR) 5 MG tablet TAKE 1 TO 2 TABLETS BY MOUTH TWICE A DAY (Patient taking differently: Take 5-10 mg by mouth daily as needed (anxiety). ) 360 tablet 1  . calcium carbonate (OSCAL) 1500 (600 Ca) MG TABS tablet Take 1,500 mg by mouth 2 (two) times daily with a meal.     . carvedilol (COREG) 3.125 MG tablet Take 3.125 mg by mouth 2 (two) times daily with a meal.    . Cholecalciferol (VITAMIN D3) 2000 units TABS Take 2 tablets by mouth daily.     Marland Kitchen Dextromethorphan-Guaifenesin (MUCINEX DM MAXIMUM STRENGTH) 60-1200 MG TB12 Take 1 tablet by mouth 2 (two) times daily. Reported on 06/23/2015    . DULoxetine (CYMBALTA) 30 MG capsule TAKE 1 CAPSULE BY MOUTH EVERY DAY 90 capsule 0  . DULoxetine (CYMBALTA) 60 MG capsule Take 1 capsule (60 mg total) by mouth daily. 90 capsule 3  . ferrous gluconate (FERGON) 324 MG tablet TAKE 1 TABLET BY MOUTH EVERY DAY WITH BREAKFAST 30 tablet 2  . Fluticasone-Umeclidin-Vilant (TRELEGY ELLIPTA) 100-62.5-25 MCG/INH AEPB Inhale 1 puff into the lungs daily. Rinse mouth 60 each 12  . glucose blood (FREESTYLE TEST STRIPS) test strip Use to test blood sugar 3 times daily. Dx: E11.65 100 each 3  . Insulin Glargine (LANTUS  SOLOSTAR) 100 UNIT/ML Solostar Pen Inject 20 Units into the skin every morning. (Patient taking differently: Inject 15 Units into the skin every morning. ) 27 pen 1  . Lancets (FREESTYLE) lancets Use to test blood sugar 3 times daily. Dx: E11.65 100 each 3  . levothyroxine (SYNTHROID) 50 MCG tablet TAKE 1 TABLET BY MOUTH EVERY DAY BEFORE BREAKFAST (Patient taking differently: Take 50 mcg by mouth daily  before breakfast. ) 90 tablet 1  . magnesium oxide (MAG-OX) 400 MG tablet TAKE 1 TABLET BY MOUTH EVERY DAY 90 tablet 1  . Misc Natural Products (TART CHERRY ADVANCED PO) Take by mouth. Once daily    . montelukast (SINGULAIR) 10 MG tablet TAKE 1 TABLET BY MOUTH EVERY DAY 90 tablet 2  . Multiple Vitamins-Minerals (CENTRUM ADULTS PO) Take 1 tablet by mouth daily.     . nitrofurantoin, macrocrystal-monohydrate, (MACROBID) 100 MG capsule Take 1 capsule (100 mg total) by mouth 2 (two) times daily. 14 capsule 0  . omeprazole (PRILOSEC) 20 MG capsule TAKE 1 CAPSULE BY MOUTH 2 TIMES A DAY BEFORE A MEAL 180 capsule 1  . Polyethyl Glycol-Propyl Glycol (SYSTANE FREE OP) Apply 1 drop to eye 3 (three) times daily.    . predniSONE (DELTASONE) 1 MG tablet Take 2 tablets (2 mg total) by mouth daily. 60 tablet 1  . rosuvastatin (CRESTOR) 10 MG tablet TAKE 1 TABLET BY MOUTH EVERY DAY IN THE EVENING 90 tablet 2  . traMADol (ULTRAM) 50 MG tablet Take 1 tablet (50 mg total) by mouth at bedtime as needed for moderate pain. 20 tablet 0  . Turmeric (QC TUMERIC COMPLEX PO) Take by mouth.    Marland Kitchen UNABLE TO FIND Take 1,000 mg by mouth daily. Med Name: Tumeric    . UNABLE TO FIND Take 1,000 mg by mouth daily. Med Name: [place]annie [place]    . vitamin B-12 (CYANOCOBALAMIN) 250 MCG tablet Take 250 mcg by mouth See admin instructions. Twice weekly    . sulfamethoxazole-trimethoprim (BACTRIM DS) 800-160 MG tablet Take 1 tablet by mouth 2 (two) times daily. 10 tablet 0   No current facility-administered medications for this visit.    Allergies: Latex and Penicillins  Past Medical History:  Diagnosis Date  . Anxiety   . Arthritis   . Asthma   . Cataracts, bilateral    removed by surgery  . COPD (chronic obstructive pulmonary disease) ([place])   . Depression   . Diabetes mellitus without complication ([place])    type 2  . Dyspnea    with exertion  . Fibromyalgia   . GERD (gastroesophageal reflux disease)   . History of chemotherapy    . History of fractured pelvis   . Hypertension   . Hypothyroidism   . Osteoporosis 12/2017   T score -2.8 stable from prior DEXA  . Osteoporosis   . Ovarian cancer ([place])   . Pneumonia    several times    Past Surgical History:  Procedure Laterality Date  . ABDOMINAL HYSTERECTOMY  1996  . ANKLE FRACTURE SURGERY     plate and screws  . BIOPSY  02/05/2019   Procedure: BIOPSY;  Surgeon: Rush Landmark Telford Nab., MD;  Location: [place];  Service: Gastroenterology;;  . BLADDER SUSPENSION    . CATARACT EXTRACTION    . COLONOSCOPY    . ESOPHAGEAL BRUSHING  02/05/2019   Procedure: ESOPHAGEAL BRUSHING;  Surgeon: Rush Landmark Telford Nab., MD;  Location: [place];  Service: Gastroenterology;;  . ESOPHAGOGASTRODUODENOSCOPY (EGD) WITH PROPOFOL N/A 02/05/2019  Procedure: ESOPHAGOGASTRODUODENOSCOPY (EGD) WITH PROPOFOL with dialtion;  Surgeon: Mansouraty, Telford Nab., MD;  Location: [facility];  Service: Gastroenterology;  Laterality: N/A;  . EYE SURGERY     cataracts removed-bilateral  . hip surgey    . knee surgey    . LUNG SURGERY    . OOPHORECTOMY     BSO  . SAVORY DILATION N/A 02/05/2019   Procedure: SAVORY DILATION;  Surgeon: Rush Landmark Telford Nab., MD;  Location: [facility];  Service: Gastroenterology;  Laterality: N/A;  . TONSILLECTOMY    . UPPER GI ENDOSCOPY      Family History  Problem Relation Age of Onset  . Lung cancer Mother   . CAD Father   . Diabetes Father   . Lung cancer Maternal Aunt   . Diabetes Paternal Aunt   . Diabetes Paternal Uncle   . Diabetes Paternal Grandmother   . Diabetes Paternal Grandfather   . Colon cancer Neg Hx   . Esophageal cancer Neg Hx   . Inflammatory bowel disease Neg Hx   . Liver disease Neg Hx   . Pancreatic cancer Neg Hx   . Rectal cancer Neg Hx   . Stomach cancer Neg Hx     Social History   Tobacco Use  . Smoking status: Never Smoker  . Smokeless tobacco: Never Used  Substance Use Topics  . Alcohol use: No     Alcohol/week: 0.0 standard drinks    Subjective:  Accompanied by her daughter today; was treated for UTI last week with Macrobid- does not feel that the medication has offered any benefit; still experiencing burning with urination; patient does have chronic incontinence and chronic back pain; no blood in urine, no fever;  Also needs to get Prolia shot updated today;   Objective:  Vitals:   04/06/19 1419  BP: 118/70  Pulse: 70  Resp: 16  Temp: 98.1 F (36.7 C)  TempSrc: Oral  Weight: 141 lb (64 kg)  Height: 4\' 11"  (1.499 m)    General: Well developed, well nourished, in no acute distress  Skin : Warm and dry.  Head: Normocephalic and atraumatic  Lungs: Respirations unlabored;  Neurologic: Alert and oriented; speech intact; face symmetrical; uses walker  Assessment:  1. Dysuria   2. Osteoporosis, unspecified osteoporosis type, unspecified pathological fracture presence     Plan:  1. Check U/A and urine culture; will treat with Bactrim DS bid x 5 days; follow up to be determined based on urine culture results; may need to see urology due to recurrent infections. 2. Prolia injection given today;    No follow-ups on file.  Orders Placed This Encounter  Procedures  . Urine Culture  . POCT Urinalysis Dipstick (Automated)    Requested Prescriptions   Signed Prescriptions Disp Refills  . sulfamethoxazole-trimethoprim (BACTRIM DS) 800-160 MG tablet 10 tablet 0    Sig: Take 1 tablet by mouth 2 (two) times daily.

## 2019-04-06 NOTE — Telephone Encounter (Signed)
Home Health Verbal Orders - Caller/Agency: [facility] Number: [phone] Requesting OT/PT/Skilled Nursing/Social Work/Speech Therapy: PT  Frequency: 2 week 4  1 week 4

## 2019-04-08 DIAGNOSIS — Z9181 History of falling: Secondary | ICD-10-CM | POA: Diagnosis not present

## 2019-04-08 DIAGNOSIS — R1319 Other dysphagia: Secondary | ICD-10-CM | POA: Diagnosis not present

## 2019-04-08 DIAGNOSIS — Z794 Long term (current) use of insulin: Secondary | ICD-10-CM | POA: Diagnosis not present

## 2019-04-08 DIAGNOSIS — M797 Fibromyalgia: Secondary | ICD-10-CM | POA: Diagnosis not present

## 2019-04-08 DIAGNOSIS — Z7952 Long term (current) use of systemic steroids: Secondary | ICD-10-CM | POA: Diagnosis not present

## 2019-04-08 DIAGNOSIS — M1712 Unilateral primary osteoarthritis, left knee: Secondary | ICD-10-CM | POA: Diagnosis not present

## 2019-04-08 DIAGNOSIS — E785 Hyperlipidemia, unspecified: Secondary | ICD-10-CM | POA: Diagnosis not present

## 2019-04-08 DIAGNOSIS — I110 Hypertensive heart disease with heart failure: Secondary | ICD-10-CM | POA: Diagnosis not present

## 2019-04-08 DIAGNOSIS — D638 Anemia in other chronic diseases classified elsewhere: Secondary | ICD-10-CM | POA: Diagnosis not present

## 2019-04-08 DIAGNOSIS — A419 Sepsis, unspecified organism: Secondary | ICD-10-CM | POA: Diagnosis not present

## 2019-04-08 DIAGNOSIS — J9621 Acute and chronic respiratory failure with hypoxia: Secondary | ICD-10-CM | POA: Diagnosis not present

## 2019-04-08 DIAGNOSIS — Z7951 Long term (current) use of inhaled steroids: Secondary | ICD-10-CM | POA: Diagnosis not present

## 2019-04-08 DIAGNOSIS — E039 Hypothyroidism, unspecified: Secondary | ICD-10-CM | POA: Diagnosis not present

## 2019-04-08 DIAGNOSIS — N390 Urinary tract infection, site not specified: Secondary | ICD-10-CM | POA: Diagnosis not present

## 2019-04-08 DIAGNOSIS — Z8543 Personal history of malignant neoplasm of ovary: Secondary | ICD-10-CM | POA: Diagnosis not present

## 2019-04-08 DIAGNOSIS — B961 Klebsiella pneumoniae [K. pneumoniae] as the cause of diseases classified elsewhere: Secondary | ICD-10-CM | POA: Diagnosis not present

## 2019-04-08 DIAGNOSIS — E1165 Type 2 diabetes mellitus with hyperglycemia: Secondary | ICD-10-CM | POA: Diagnosis not present

## 2019-04-08 DIAGNOSIS — M810 Age-related osteoporosis without current pathological fracture: Secondary | ICD-10-CM | POA: Diagnosis not present

## 2019-04-08 DIAGNOSIS — Z66 Do not resuscitate: Secondary | ICD-10-CM | POA: Diagnosis not present

## 2019-04-08 DIAGNOSIS — J441 Chronic obstructive pulmonary disease with (acute) exacerbation: Secondary | ICD-10-CM | POA: Diagnosis not present

## 2019-04-08 DIAGNOSIS — I5032 Chronic diastolic (congestive) heart failure: Secondary | ICD-10-CM | POA: Diagnosis not present

## 2019-04-08 DIAGNOSIS — G9341 Metabolic encephalopathy: Secondary | ICD-10-CM | POA: Diagnosis not present

## 2019-04-08 LAB — URINE CULTURE

## 2019-04-09 ENCOUNTER — Telehealth: Payer: Self-pay | Admitting: Internal Medicine

## 2019-04-09 NOTE — Telephone Encounter (Signed)
Called and left message for Kayla Thompson today recapping results and for her to call back with any further questions or concerns and if her mom was having symptoms after completing antibiotic to return to clinic to see Dr. Sharlet Salina.

## 2019-04-09 NOTE — Telephone Encounter (Signed)
    Daughter Kayla Thompson [facility]) calling to discuss urine culture result and antibiotic Please call [phone]

## 2019-04-09 NOTE — Telephone Encounter (Signed)
Comments to Patient Seen by patient Kayla Thompson on 04/09/2019 11:01 AM EST  Did show UTI- should be covered by the antibiotic given. Please follow-up with Dr. Sharlet Salina if your symptoms persist.  Written by Marrian Salvage, [place] on 04/09/2019 8:58 AM EST

## 2019-04-10 ENCOUNTER — Telehealth: Payer: Self-pay | Admitting: Internal Medicine

## 2019-04-10 ENCOUNTER — Other Ambulatory Visit: Payer: Self-pay | Admitting: Family

## 2019-04-10 DIAGNOSIS — R300 Dysuria: Secondary | ICD-10-CM

## 2019-04-10 MED ORDER — SULFAMETHOXAZOLE-TRIMETHOPRIM 800-160 MG PO TABS: 1.0000 | ORAL_TABLET | Freq: Two times a day (BID) | ORAL | 0 refills | Status: DC

## 2019-04-10 NOTE — Telephone Encounter (Signed)
Pt was seen for UTI and given 5 day supply of antibiotic but her daughter thinks it needs to be increased and doesn't want her in the hospital again for a UTI/ please advise /Pt has one pill left

## 2019-04-10 NOTE — Telephone Encounter (Addendum)
The dosage of this medication is standard and can't really be increased. It should treat the infection appropriately based on the urine culture results that we got back. Let me extend the treatment for 3 more days and then let's have her drop off a culture to re-check and make sure it has cleared.   Please schedule a lab appointment for them to drop off the sample.

## 2019-04-10 NOTE — Addendum Note (Signed)
Addended by: Juliet Rude on: 04/10/2019 01:50 PM   Modules accepted: Orders

## 2019-04-10 NOTE — Telephone Encounter (Signed)
[name] has been informed. Lab visit scheduled for Monday for repeat urine culture. I have sent in 3 additional days of Bactrim per Jodi Mourning, NP request.

## 2019-04-11 ENCOUNTER — Other Ambulatory Visit: Payer: Self-pay | Admitting: Family

## 2019-04-11 ENCOUNTER — Telehealth: Payer: Self-pay | Admitting: Internal Medicine

## 2019-04-11 MED ORDER — SULFAMETHOXAZOLE-TRIMETHOPRIM 800-160 MG PO TABS: 1.0000 | ORAL_TABLET | Freq: Two times a day (BID) | ORAL | 0 refills | Status: DC

## 2019-04-11 NOTE — Telephone Encounter (Signed)
[facility] called and stated that the patient wants to put PT on hold this week due to the passing of her sister/ please advise

## 2019-04-11 NOTE — Telephone Encounter (Signed)
Noted  

## 2019-04-16 ENCOUNTER — Other Ambulatory Visit: Payer: Self-pay

## 2019-04-16 ENCOUNTER — Other Ambulatory Visit: Payer: Medicare Other

## 2019-04-16 DIAGNOSIS — R300 Dysuria: Secondary | ICD-10-CM | POA: Diagnosis not present

## 2019-04-17 ENCOUNTER — Encounter: Payer: Self-pay | Admitting: Internal Medicine

## 2019-04-17 ENCOUNTER — Ambulatory Visit (INDEPENDENT_AMBULATORY_CARE_PROVIDER_SITE_OTHER): Payer: Medicare Other | Admitting: Internal Medicine

## 2019-04-17 DIAGNOSIS — M818 Other osteoporosis without current pathological fracture: Secondary | ICD-10-CM | POA: Diagnosis not present

## 2019-04-17 DIAGNOSIS — E039 Hypothyroidism, unspecified: Secondary | ICD-10-CM | POA: Diagnosis not present

## 2019-04-17 DIAGNOSIS — E213 Hyperparathyroidism, unspecified: Secondary | ICD-10-CM

## 2019-04-17 DIAGNOSIS — G9341 Metabolic encephalopathy: Secondary | ICD-10-CM | POA: Diagnosis not present

## 2019-04-17 DIAGNOSIS — M1712 Unilateral primary osteoarthritis, left knee: Secondary | ICD-10-CM | POA: Diagnosis not present

## 2019-04-17 DIAGNOSIS — E1165 Type 2 diabetes mellitus with hyperglycemia: Secondary | ICD-10-CM | POA: Diagnosis not present

## 2019-04-17 DIAGNOSIS — B961 Klebsiella pneumoniae [K. pneumoniae] as the cause of diseases classified elsewhere: Secondary | ICD-10-CM | POA: Diagnosis not present

## 2019-04-17 DIAGNOSIS — A419 Sepsis, unspecified organism: Secondary | ICD-10-CM | POA: Diagnosis not present

## 2019-04-17 DIAGNOSIS — Z794 Long term (current) use of insulin: Secondary | ICD-10-CM

## 2019-04-17 DIAGNOSIS — N390 Urinary tract infection, site not specified: Secondary | ICD-10-CM | POA: Diagnosis not present

## 2019-04-17 DIAGNOSIS — J441 Chronic obstructive pulmonary disease with (acute) exacerbation: Secondary | ICD-10-CM | POA: Diagnosis not present

## 2019-04-17 LAB — URINE CULTURE

## 2019-04-17 NOTE — Progress Notes (Signed)
Patient ID: Kayla Thompson, female   DOB: 08-28-1935, 84 y.o.   MRN: [id number]  Patient location: Home My location: Office Persons participating in the virtual visit: patient, provider  I connected with the patient on 04/17/19 at  1:00 PM EST by a video enabled telemedicine application and verified that I am speaking with the correct person.   I discussed the limitations of evaluation and management by telemedicine and the availability of in person appointments. The patient expressed understanding and agreed to proceed.   Details of the encounter are shown below.  HPI  Kayla Thompson is a 84 y.o.-year-old female, initially referred by her OB/GYN doctor, Dr. Uvaldo Rising, for evaluation for hyperparathyroidism + normo/hypo-calcemia, vitamin D deficiency,  and DM2, dx 2010, insulin-dependent since 2010, controlled, w/o long-term complications.  Last visit 6 months ago.  Since last visit, she was admitted with UTI and encephalopathy 01/2019.  Reviewed and addended history: Pt was dx with hyperparathyroidism in 08/2014.   Reviewed pertinent labs: Lab Results  Component Value Date   PTH 37 06/09/2015   PTH Comment 06/09/2015   PTH 73 (H) 10/28/2014   PTH Comment 10/28/2014   PTH 190 (H) 09/10/2014   PTH 243 (H) 08/23/2014   CALCIUM 8.5 (L) 01/30/2019   CALCIUM 8.5 (L) 01/29/2019   CALCIUM 8.8 (L) 01/28/2019   CALCIUM 8.2 (L) 01/27/2019   CALCIUM 8.7 (L) 01/25/2019   CALCIUM 9.7 10/12/2018   CALCIUM 9.9 02/03/2018   CALCIUM 9.8 10/17/2017   CALCIUM 9.0 10/10/2017   CALCIUM 8.7 (L) 10/09/2017   She has osteoporosis diagnosed in 2007.  Reviewed patient's DXA scan reports: Date L1-L2 (L3 and L4 excluded) T score FN T score  01/05/2018  +0.4  LFN: -2.8  07/25/2014   -0.1  LFN: -2.7  Right hip could not be analyzed due to previous instrumentation.  She has a history of multiple fractures: - 09/23/1990: pelvic fx - 02/2005: R hip fx - rod - 05/2006: rib fx - 12/2008: 3 R  ankle fx - in [place] - plate and screws - 075-GRM: wrist fx's 01/2012: steroid inj's R knee - 07/2014: forearm fx and nasal bone fx  She started Prolia in 2017.  She is tolerating this well.  No h/o kidney stones.  + Mild CKD. Last BUN/Cr: Lab Results  Component Value Date   BUN 16 01/30/2019   CREATININE 0.87 01/30/2019   Pt is not on HCTZ.  Patient continues on Caltrate 600 mg now 1x a day and vitamin D 4000 units daily.  Pt does not have a FH of hypercalcemia, pituitary tumors, thyroid cancer, or osteoporosis.   Vitamin D levels were normal: Lab Results  Component Value Date   VD25OH 72.23 10/12/2018   VD25OH 81.19 06/09/2015   VD25OH 65.60 01/28/2015   VD25OH 48 08/23/2014   A 24-hour urine collection for calcium was normal: Component     Latest Ref Rng 03/31/2015  Calcium, Ur     Not estab mg/dL 9  Calcium, 24 hour urine     35 - 250 mg/24 h 108  Creatinine, Urine     20 - 320 mg/dL 78  Creatinine, 24H Ur     0.63 - 2.50 g/24 h 0.94   DM2: - dx 2010 >> started insulin at diagnosis:  Reviewed HbA1c levels: Lab Results  Component Value Date   HGBA1C 6.8 (H) 01/08/2019   HGBA1C 5.9 (A) 10/12/2018   HGBA1C 6.7 (A) 04/13/2018   She is on: - Lantus  30 >> 25 >> 20 >> 15 units in am - Humalog 4-6 >> Novolog 4 units before breakfast only >> stopped 09/2018  She checks sugars once a day: - am: 46, 80-121, 138 >> 102-112 >> 99-119 >> 109-119 >> 91-142 - after lunch: 120 >> 116-154 >> n/c  - after dinner: 132 >> 70 >> n/c - bedtime: 142, 228 >> 97, 116 >> 60 >> n/c >> <160 Lowest: 92 >> 37 (?) on BMP 04/2017 >> 81 >> 91 Highest: 189 >> 147 with a steroid inj's >> 170 with steroid inj (shoulder and knee) >> 194.  -+ HL; latest lipids:  Lab Results  Component Value Date   CHOL 149 10/12/2018   HDL 63.00 10/12/2018   LDLCALC 60 10/12/2018   TRIG 130.0 10/12/2018   CHOLHDL 2 10/12/2018  On Crestor 10.  + mild CKD;. Latest BUN/Cr: Lab Results  Component  Value Date   BUN 16 01/30/2019   Lab Results  Component Value Date   CREATININE 0.87 01/30/2019   Last dilated eye exam: 09/2018: No DR. Dr Gershon Crane.  She has a history of chalazion, has a history of cataract surgery.  She also has a history of Ovarian cancer - 1996, urinary incontinence, HTN.  She has a history of hypothyroidism and latest TSH was again suppressed at last check-I was not aware of this: Lab Results  Component Value Date   TSH 0.110 (L) 01/27/2019   TSH 0.85 10/12/2018   TSH 0.23 (L) 12/01/2016   TSH 0.61 07/15/2016   TSH 0.61 10/21/2015   TSH 0.36 03/03/2015   TSH 0.591 08/02/2014   She was reticent to decrease the dose of levothyroxine in the past due to fear of hair loss.  She is on LT4 50 mcg daily.  At last visit she was taking her thyroxine only 10 minutes from breakfast, along with calcium, multivitamins, PPIs.  We discussed about how to take it correctly.  Pt denies: - feeling nodules in neck - hoarseness - choking - SOB with lying down But she does have chronic dysphagia.  ROS: Constitutional: no weight gain/no weight loss, no fatigue, no subjective hyperthermia, no subjective hypothermia Eyes: no blurry vision, no xerophthalmia ENT: no sore throat, + see HPI Cardiovascular: no CP/no SOB/no palpitations/no leg swelling Respiratory: no cough/no SOB/no wheezing Gastrointestinal: no N/no V/no D/no C/+ acid reflux Musculoskeletal: + Muscle aches/+ joint aches Skin: no rashes, no hair loss Neurological: + Tremors/no numbness/no tingling/no dizziness  I reviewed pt's medications, allergies, PMH, social hx, family hx, and changes were documented in the history of present illness. Otherwise, unchanged from my initial visit note.   Past Medical History:  Diagnosis Date  . Anxiety   . Arthritis   . Asthma   . Cataracts, bilateral    removed by surgery  . COPD (chronic obstructive pulmonary disease) ([place])   . Depression   . Diabetes mellitus  without complication ([place])    type 2  . Dyspnea    with exertion  . Fibromyalgia   . GERD (gastroesophageal reflux disease)   . History of chemotherapy   . History of fractured pelvis   . Hypertension   . Hypothyroidism   . Osteoporosis 12/2017   T score -2.8 stable from prior DEXA  . Osteoporosis   . Ovarian cancer ([place])   . Pneumonia    several times   Past Surgical History:  Procedure Laterality Date  . ABDOMINAL HYSTERECTOMY  1996  . ANKLE FRACTURE SURGERY  plate and screws  . BIOPSY  02/05/2019   Procedure: BIOPSY;  Surgeon: Rush Landmark Telford Nab., MD;  Location: [facility];  Service: Gastroenterology;;  . BLADDER SUSPENSION    . CATARACT EXTRACTION    . COLONOSCOPY    . ESOPHAGEAL BRUSHING  02/05/2019   Procedure: ESOPHAGEAL BRUSHING;  Surgeon: Rush Landmark Telford Nab., MD;  Location: [facility];  Service: Gastroenterology;;  . ESOPHAGOGASTRODUODENOSCOPY (EGD) WITH PROPOFOL N/A 02/05/2019   Procedure: ESOPHAGOGASTRODUODENOSCOPY (EGD) WITH PROPOFOL with dialtion;  Surgeon: Irving Copas., MD;  Location: [facility];  Service: Gastroenterology;  Laterality: N/A;  . EYE SURGERY     cataracts removed-bilateral  . hip surgey    . knee surgey    . LUNG SURGERY    . OOPHORECTOMY     BSO  . SAVORY DILATION N/A 02/05/2019   Procedure: SAVORY DILATION;  Surgeon: Rush Landmark Telford Nab., MD;  Location: [facility];  Service: Gastroenterology;  Laterality: N/A;  . TONSILLECTOMY    . UPPER GI ENDOSCOPY     History   Social History  . Marital Status: Widowed    Spouse Name: N/A  . Number of Children: 2 (1 adopted)   Occupational History  . retired   Social History Main Topics  . Smoking status: Never Smoker   . Smokeless tobacco: Never Used  . Alcohol Use: No  . Drug Use: No   Current Outpatient Medications on File Prior to Visit  Medication Sig Dispense Refill  . albuterol (PROVENTIL HFA;VENTOLIN HFA) 108 (90 Base) MCG/ACT inhaler Inhale 2 puffs  into the lungs every 6 (six) hours as needed for wheezing or shortness of breath. 1 Inhaler 11  . amitriptyline (ELAVIL) 10 MG tablet Take 1 tablet (10 mg total) by mouth at bedtime. 90 tablet 3  . baclofen (LIORESAL) 10 MG tablet TAKE 0.5-1 TABLET BY MOUTH TWICE DAILY AS NEEDED FOR MUSCLE SPASMS OR PAIN 180 tablet 1  . BD PEN NEEDLE NANO U/F 32G X 4 MM MISC USE 4 TIMES A DAY 400 each 1  . busPIRone (BUSPAR) 5 MG tablet TAKE 1 TO 2 TABLETS BY MOUTH TWICE A DAY (Patient taking differently: Take 5-10 mg by mouth daily as needed (anxiety). ) 360 tablet 1  . calcium carbonate (OSCAL) 1500 (600 Ca) MG TABS tablet Take 1,500 mg by mouth 2 (two) times daily with a meal.     . carvedilol (COREG) 3.125 MG tablet Take 3.125 mg by mouth 2 (two) times daily with a meal.    . Cholecalciferol (VITAMIN D3) 2000 units TABS Take 2 tablets by mouth daily.     Marland Kitchen Dextromethorphan-Guaifenesin (MUCINEX DM MAXIMUM STRENGTH) 60-1200 MG TB12 Take 1 tablet by mouth 2 (two) times daily. Reported on 06/23/2015    . DULoxetine (CYMBALTA) 30 MG capsule TAKE 1 CAPSULE BY MOUTH EVERY DAY 90 capsule 0  . DULoxetine (CYMBALTA) 60 MG capsule Take 1 capsule (60 mg total) by mouth daily. 90 capsule 3  . ferrous gluconate (FERGON) 324 MG tablet TAKE 1 TABLET BY MOUTH EVERY DAY WITH BREAKFAST 30 tablet 2  . Fluticasone-Umeclidin-Vilant (TRELEGY ELLIPTA) 100-62.5-25 MCG/INH AEPB Inhale 1 puff into the lungs daily. Rinse mouth 60 each 12  . glucose blood (FREESTYLE TEST STRIPS) test strip Use to test blood sugar 3 times daily. Dx: E11.65 100 each 3  . Insulin Glargine (LANTUS SOLOSTAR) 100 UNIT/ML Solostar Pen Inject 20 Units into the skin every morning. (Patient taking differently: Inject 15 Units into the skin every morning. ) 27 pen 1  .  Lancets (FREESTYLE) lancets Use to test blood sugar 3 times daily. Dx: E11.65 100 each 3  . levothyroxine (SYNTHROID) 50 MCG tablet TAKE 1 TABLET BY MOUTH EVERY DAY BEFORE BREAKFAST (Patient taking  differently: Take 50 mcg by mouth daily before breakfast. ) 90 tablet 1  . magnesium oxide (MAG-OX) 400 MG tablet TAKE 1 TABLET BY MOUTH EVERY DAY 90 tablet 1  . Misc Natural Products (TART CHERRY ADVANCED PO) Take by mouth. Once daily    . montelukast (SINGULAIR) 10 MG tablet TAKE 1 TABLET BY MOUTH EVERY DAY 90 tablet 2  . Multiple Vitamins-Minerals (CENTRUM ADULTS PO) Take 1 tablet by mouth daily.     Marland Kitchen omeprazole (PRILOSEC) 20 MG capsule TAKE 1 CAPSULE BY MOUTH 2 TIMES A DAY BEFORE A MEAL 180 capsule 1  . Polyethyl Glycol-Propyl Glycol (SYSTANE FREE OP) Apply 1 drop to eye 3 (three) times daily.    . predniSONE (DELTASONE) 1 MG tablet Take 2 tablets (2 mg total) by mouth daily. 60 tablet 1  . rosuvastatin (CRESTOR) 10 MG tablet TAKE 1 TABLET BY MOUTH EVERY DAY IN THE EVENING 90 tablet 2  . sulfamethoxazole-trimethoprim (BACTRIM DS) 800-160 MG tablet Take 1 tablet by mouth 2 (two) times daily. 6 tablet 0  . sulfamethoxazole-trimethoprim (BACTRIM DS) 800-160 MG tablet Take 1 tablet by mouth 2 (two) times daily. 6 tablet 0  . traMADol (ULTRAM) 50 MG tablet Take 1 tablet (50 mg total) by mouth at bedtime as needed for moderate pain. 20 tablet 0  . Turmeric (QC TUMERIC COMPLEX PO) Take by mouth.    Marland Kitchen UNABLE TO FIND Take 1,000 mg by mouth daily. Med Name: Tumeric    . UNABLE TO FIND Take 1,000 mg by mouth daily. Med Name: [place]    . vitamin B-12 (CYANOCOBALAMIN) 250 MCG tablet Take 250 mcg by mouth See admin instructions. Twice weekly    . [DISCONTINUED] ipratropium-albuterol (DUONEB) 0.5-2.5 (3) MG/3ML SOLN USE 1 VIAL IN NEBULIZER 3 TIMES DAILY 90 mL 2   No current facility-administered medications on file prior to visit.   Allergies  Allergen Reactions  . Latex Rash  . Penicillins Rash    Has patient had a PCN reaction causing immediate rash, facial/tongue/throat swelling, SOB or lightheadedness with hypotension: No Has patient had a PCN reaction causing severe rash involving mucus  membranes or skin necrosis: No Has patient had a PCN reaction that required hospitalization: No Has patient had a PCN reaction occurring within the last 10 years: No If all of the above answers are "NO", then may proceed with Cephalosporin use.    Family History  Problem Relation Age of Onset  . Lung cancer Mother   . CAD Father   . Diabetes Father   . Lung cancer Maternal Aunt   . Diabetes Paternal Aunt   . Diabetes Paternal Uncle   . Diabetes Paternal Grandmother   . Diabetes Paternal Grandfather   . Colon cancer Neg Hx   . Esophageal cancer Neg Hx   . Inflammatory bowel disease Neg Hx   . Liver disease Neg Hx   . Pancreatic cancer Neg Hx   . Rectal cancer Neg Hx   . Stomach cancer Neg Hx    PE: LMP  (LMP Unknown)  There is no height or weight on file to calculate BMI. Wt Readings from Last 3 Encounters:  04/06/19 141 lb (64 kg)  03/27/19 140 lb 12.8 oz (63.9 kg)  02/26/19 140 lb (63.5 kg)   Constitutional:  in NAD  The physical exam was not performed (virtual visit).  Assessment: 1. H/o Hyperparathyroidism - recent normocalcemia  2. Vitamin D def  3. DM2, insulin-dependent, controlled, without complications  4. Low TSH  5.  Osteoporosis  Plan: 1. HPTH and 2. Vit D def -Patient with history of hyperparathyroidism in the setting of low calcium, which normalized afterwards (however, latest calcium level was low). She continues on Prolia injections, calcium 600 mg once a day and vitamin D 4000 units daily -PTH was normal at last check  -We will need to recheck a BMP and a vitamin D level at next visit  3. DM2 - Patient with longstanding, previously uncontrolled type 2 diabetes, on basal insulin.  At last visit, we stopped her NovoLog as sugars improved and her HbA1c was 5.9%, much better compared to before.  At that time, I advised her to check sugars at different times of the day, rotating check times and to let me know if they start increasing or  decreasing. -Since then, she had another HbA1c-2020 and this was 6.8%, increased, but still at goal -At this visit, sugars are excellent, so no changes are needed in her regimen - I advised her to  Patient Instructions  Please continue: - Lantus 15 units in am.  Continue levothyroxine 50 mcg daily.  Take the thyroid hormone every day, with water, at least 30 minutes before breakfast, separated by at least 4 hours from: - acid reflux medications - calcium - iron - multivitamins  Take multivitamins, omeprazole, and calcium at night.  Continue vitamin D 4000 units daily.  Check sugars 1-2x a day, rotating check times.  Please come back for a follow-up appointment in 4-6 months.  - we will recheck her HbA1c when she returns to the clinic - advised to check sugars at different times of the day - 1x a day, rotating check times - advised for yearly eye exams >> she is UTD - return to clinic in 3-4 months  4.  Low TSH -She has a history of hypothyroidism and is on levothyroxine 50 mcg daily. -In the past she was reticent to decrease the dose of levothyroxine although she did have a low TSH, for fear of hair loss.  We discussed at that time about the cardiovascular risks of thyrotoxicosis. -We reviewed together her latest TFTs and in 01/2019, her TSH was again suppressed, at 0.110. -At this visit, we again discussed about the need to decrease her levothyroxine. - I will first have her back for labs  5.  Osteoporosis -Latest DXA scan results were reviewed from 12/2017: Left femoral neck T score was -2.8, only slightly lower compared to 2016: -2.6.  Her right femoral neck could not be analyzed because of hardware. -We will continue Prolia for now.  No hip/thigh/jaw pain -She is due for another DEXA scan towards the end of this year  Orders Placed This Encounter  Procedures  . TSH  . T4, free  . Hemoglobin A1c   Philemon Kingdom, MD PhD [facility] Endocrinology

## 2019-04-17 NOTE — Patient Instructions (Addendum)
Please continue: - Lantus 15 units in am.  Continue levothyroxine 50 mcg daily.  Take the thyroid hormone every day, with water, at least 30 minutes before breakfast, separated by at least 4 hours from: - acid reflux medications - calcium - iron - multivitamins  Take multivitamins, omeprazole, and calcium at night.  Continue vitamin D 4000 units daily.  Check sugars 1-2x a day, rotating check times.  Please come back for a follow-up appointment in 4-6 months.

## 2019-04-18 DIAGNOSIS — F419 Anxiety disorder, unspecified: Secondary | ICD-10-CM | POA: Diagnosis not present

## 2019-04-18 DIAGNOSIS — F331 Major depressive disorder, recurrent, moderate: Secondary | ICD-10-CM | POA: Diagnosis not present

## 2019-04-19 DIAGNOSIS — A419 Sepsis, unspecified organism: Secondary | ICD-10-CM | POA: Diagnosis not present

## 2019-04-19 DIAGNOSIS — M1712 Unilateral primary osteoarthritis, left knee: Secondary | ICD-10-CM | POA: Diagnosis not present

## 2019-04-19 DIAGNOSIS — N390 Urinary tract infection, site not specified: Secondary | ICD-10-CM | POA: Diagnosis not present

## 2019-04-19 DIAGNOSIS — J441 Chronic obstructive pulmonary disease with (acute) exacerbation: Secondary | ICD-10-CM | POA: Diagnosis not present

## 2019-04-19 DIAGNOSIS — B961 Klebsiella pneumoniae [K. pneumoniae] as the cause of diseases classified elsewhere: Secondary | ICD-10-CM | POA: Diagnosis not present

## 2019-04-19 DIAGNOSIS — G9341 Metabolic encephalopathy: Secondary | ICD-10-CM | POA: Diagnosis not present

## 2019-04-23 DIAGNOSIS — M810 Age-related osteoporosis without current pathological fracture: Secondary | ICD-10-CM | POA: Diagnosis not present

## 2019-04-23 DIAGNOSIS — M25519 Pain in unspecified shoulder: Secondary | ICD-10-CM | POA: Diagnosis not present

## 2019-04-23 DIAGNOSIS — M2550 Pain in unspecified joint: Secondary | ICD-10-CM | POA: Diagnosis not present

## 2019-04-23 DIAGNOSIS — M19011 Primary osteoarthritis, right shoulder: Secondary | ICD-10-CM | POA: Diagnosis not present

## 2019-04-23 DIAGNOSIS — Z7952 Long term (current) use of systemic steroids: Secondary | ICD-10-CM | POA: Diagnosis not present

## 2019-04-23 DIAGNOSIS — M19012 Primary osteoarthritis, left shoulder: Secondary | ICD-10-CM | POA: Diagnosis not present

## 2019-04-23 DIAGNOSIS — Z8739 Personal history of other diseases of the musculoskeletal system and connective tissue: Secondary | ICD-10-CM | POA: Diagnosis not present

## 2019-04-23 DIAGNOSIS — R700 Elevated erythrocyte sedimentation rate: Secondary | ICD-10-CM | POA: Diagnosis not present

## 2019-04-23 DIAGNOSIS — M79643 Pain in unspecified hand: Secondary | ICD-10-CM | POA: Diagnosis not present

## 2019-04-23 DIAGNOSIS — M1990 Unspecified osteoarthritis, unspecified site: Secondary | ICD-10-CM | POA: Diagnosis not present

## 2019-04-23 DIAGNOSIS — M797 Fibromyalgia: Secondary | ICD-10-CM | POA: Diagnosis not present

## 2019-04-23 DIAGNOSIS — M25512 Pain in left shoulder: Secondary | ICD-10-CM | POA: Diagnosis not present

## 2019-04-23 DIAGNOSIS — M25511 Pain in right shoulder: Secondary | ICD-10-CM | POA: Diagnosis not present

## 2019-04-24 DIAGNOSIS — N390 Urinary tract infection, site not specified: Secondary | ICD-10-CM | POA: Diagnosis not present

## 2019-04-24 DIAGNOSIS — B961 Klebsiella pneumoniae [K. pneumoniae] as the cause of diseases classified elsewhere: Secondary | ICD-10-CM | POA: Diagnosis not present

## 2019-04-24 DIAGNOSIS — A419 Sepsis, unspecified organism: Secondary | ICD-10-CM | POA: Diagnosis not present

## 2019-04-24 DIAGNOSIS — G9341 Metabolic encephalopathy: Secondary | ICD-10-CM | POA: Diagnosis not present

## 2019-04-24 DIAGNOSIS — J441 Chronic obstructive pulmonary disease with (acute) exacerbation: Secondary | ICD-10-CM | POA: Diagnosis not present

## 2019-04-24 DIAGNOSIS — M1712 Unilateral primary osteoarthritis, left knee: Secondary | ICD-10-CM | POA: Diagnosis not present

## 2019-04-26 DIAGNOSIS — B961 Klebsiella pneumoniae [K. pneumoniae] as the cause of diseases classified elsewhere: Secondary | ICD-10-CM | POA: Diagnosis not present

## 2019-04-26 DIAGNOSIS — A419 Sepsis, unspecified organism: Secondary | ICD-10-CM | POA: Diagnosis not present

## 2019-04-26 DIAGNOSIS — M1712 Unilateral primary osteoarthritis, left knee: Secondary | ICD-10-CM | POA: Diagnosis not present

## 2019-04-26 DIAGNOSIS — N390 Urinary tract infection, site not specified: Secondary | ICD-10-CM | POA: Diagnosis not present

## 2019-04-26 DIAGNOSIS — G9341 Metabolic encephalopathy: Secondary | ICD-10-CM | POA: Diagnosis not present

## 2019-04-26 DIAGNOSIS — J441 Chronic obstructive pulmonary disease with (acute) exacerbation: Secondary | ICD-10-CM | POA: Diagnosis not present

## 2019-04-30 ENCOUNTER — Telehealth: Payer: Self-pay | Admitting: Internal Medicine

## 2019-04-30 DIAGNOSIS — G9341 Metabolic encephalopathy: Secondary | ICD-10-CM | POA: Diagnosis not present

## 2019-04-30 DIAGNOSIS — B961 Klebsiella pneumoniae [K. pneumoniae] as the cause of diseases classified elsewhere: Secondary | ICD-10-CM | POA: Diagnosis not present

## 2019-04-30 DIAGNOSIS — J441 Chronic obstructive pulmonary disease with (acute) exacerbation: Secondary | ICD-10-CM | POA: Diagnosis not present

## 2019-04-30 DIAGNOSIS — N390 Urinary tract infection, site not specified: Secondary | ICD-10-CM | POA: Diagnosis not present

## 2019-04-30 DIAGNOSIS — A419 Sepsis, unspecified organism: Secondary | ICD-10-CM | POA: Diagnosis not present

## 2019-04-30 DIAGNOSIS — M1712 Unilateral primary osteoarthritis, left knee: Secondary | ICD-10-CM | POA: Diagnosis not present

## 2019-04-30 DIAGNOSIS — J479 Bronchiectasis, uncomplicated: Secondary | ICD-10-CM

## 2019-04-30 MED ORDER — ALBUTEROL SULFATE HFA 108 (90 BASE) MCG/ACT IN AERS: 2.0000 | INHALATION_SPRAY | Freq: Four times a day (QID) | RESPIRATORY_TRACT | 1 refills | Status: DC | PRN

## 2019-04-30 MED ORDER — TRELEGY ELLIPTA 100-62.5-25 MCG/INH IN AEPB: 1.0000 | INHALATION_SPRAY | Freq: Every day | RESPIRATORY_TRACT | 11 refills | Status: DC

## 2019-04-30 NOTE — Telephone Encounter (Signed)
Called and spoke with the pt's daughter  The pt misplaced her rx CDY printed for her for the trelegy  She prefers this over the spiriva and advair and needs rx sent  I have sent this to pharm as well as her proair  Pt understands to no longer take the spiriva and advair  Nothing further needed

## 2019-05-01 DIAGNOSIS — Z79899 Other long term (current) drug therapy: Secondary | ICD-10-CM | POA: Diagnosis not present

## 2019-05-01 DIAGNOSIS — M25512 Pain in left shoulder: Secondary | ICD-10-CM | POA: Diagnosis not present

## 2019-05-01 DIAGNOSIS — Z13220 Encounter for screening for lipoid disorders: Secondary | ICD-10-CM | POA: Diagnosis not present

## 2019-05-01 DIAGNOSIS — Z0001 Encounter for general adult medical examination with abnormal findings: Secondary | ICD-10-CM | POA: Diagnosis not present

## 2019-05-01 DIAGNOSIS — E119 Type 2 diabetes mellitus without complications: Secondary | ICD-10-CM | POA: Diagnosis not present

## 2019-05-01 DIAGNOSIS — M25511 Pain in right shoulder: Secondary | ICD-10-CM | POA: Diagnosis not present

## 2019-05-01 DIAGNOSIS — Z114 Encounter for screening for human immunodeficiency virus [HIV]: Secondary | ICD-10-CM | POA: Diagnosis not present

## 2019-05-01 DIAGNOSIS — Z0000 Encounter for general adult medical examination without abnormal findings: Secondary | ICD-10-CM | POA: Diagnosis not present

## 2019-05-03 DIAGNOSIS — N390 Urinary tract infection, site not specified: Secondary | ICD-10-CM | POA: Diagnosis not present

## 2019-05-03 DIAGNOSIS — A419 Sepsis, unspecified organism: Secondary | ICD-10-CM | POA: Diagnosis not present

## 2019-05-03 DIAGNOSIS — M1712 Unilateral primary osteoarthritis, left knee: Secondary | ICD-10-CM | POA: Diagnosis not present

## 2019-05-03 DIAGNOSIS — B961 Klebsiella pneumoniae [K. pneumoniae] as the cause of diseases classified elsewhere: Secondary | ICD-10-CM | POA: Diagnosis not present

## 2019-05-03 DIAGNOSIS — J441 Chronic obstructive pulmonary disease with (acute) exacerbation: Secondary | ICD-10-CM | POA: Diagnosis not present

## 2019-05-03 DIAGNOSIS — G9341 Metabolic encephalopathy: Secondary | ICD-10-CM | POA: Diagnosis not present

## 2019-05-08 DIAGNOSIS — G9341 Metabolic encephalopathy: Secondary | ICD-10-CM | POA: Diagnosis not present

## 2019-05-09 ENCOUNTER — Telehealth: Payer: Self-pay | Admitting: Internal Medicine

## 2019-05-09 NOTE — Telephone Encounter (Signed)
   Patient scheduled for appointment on 2/18. Patient wants to know if order for urine/ lab  specimen can be taken today at [facility] lab due to possible weather delays tomorrow. She is worried she will not get to see Dr Sharlet Salina tomorrow

## 2019-05-09 NOTE — Telephone Encounter (Signed)
F/u  The patient calling back to check on this morning's messages.

## 2019-05-10 ENCOUNTER — Ambulatory Visit: Payer: Medicare Other | Admitting: Internal Medicine

## 2019-05-10 NOTE — Telephone Encounter (Signed)
Pt has been contacted and rescheduled for in [place] for 05/11/2019

## 2019-05-11 ENCOUNTER — Encounter: Payer: Self-pay | Admitting: Internal Medicine

## 2019-05-11 ENCOUNTER — Other Ambulatory Visit: Payer: Self-pay

## 2019-05-11 ENCOUNTER — Ambulatory Visit (INDEPENDENT_AMBULATORY_CARE_PROVIDER_SITE_OTHER): Payer: Medicare Other | Admitting: Internal Medicine

## 2019-05-11 VITALS — BP 118/78 | HR 72 | Temp 98.0°F | Resp 16 | Ht 59.0 in | Wt 140.6 lb

## 2019-05-11 DIAGNOSIS — E08621 Diabetes mellitus due to underlying condition with foot ulcer: Secondary | ICD-10-CM

## 2019-05-11 DIAGNOSIS — L97511 Non-pressure chronic ulcer of other part of right foot limited to breakdown of skin: Secondary | ICD-10-CM | POA: Diagnosis not present

## 2019-05-11 DIAGNOSIS — Z794 Long term (current) use of insulin: Secondary | ICD-10-CM | POA: Diagnosis not present

## 2019-05-11 DIAGNOSIS — E1165 Type 2 diabetes mellitus with hyperglycemia: Secondary | ICD-10-CM | POA: Diagnosis not present

## 2019-05-11 DIAGNOSIS — R300 Dysuria: Secondary | ICD-10-CM | POA: Insufficient documentation

## 2019-05-11 LAB — POC URINALSYSI DIPSTICK (AUTOMATED)
Bilirubin, UA: NEGATIVE
Glucose, UA: NEGATIVE
Ketones, UA: NEGATIVE
Nitrite, UA: NEGATIVE
Protein, UA: POSITIVE — AB
Spec Grav, UA: 1.015 (ref 1.010–1.025)
Urobilinogen, UA: NEGATIVE E.U./dL — AB
pH, UA: 6 (ref 5.0–8.0)

## 2019-05-11 MED ORDER — SULFAMETHOXAZOLE-TRIMETHOPRIM 800-160 MG PO TABS: 1.0000 | ORAL_TABLET | Freq: Two times a day (BID) | ORAL | 0 refills | Status: DC

## 2019-05-11 NOTE — Progress Notes (Signed)
   Subjective:   Patient ID: Kayla Thompson, female    DOB: [birth year], 84 y.o.   MRN: [id number]  HPI The patient is an 84 YO female coming in for several concerns including wound on right foot (2nd toe, ulcer present due to hammertoe and rubbing on shoes, started about 1 month ago with redness, now with ulcer, some pain and redness surrounding, has diabetes so was concerned this is not healing, on chronic prednisone), and dysuria (had urinary infection in January, felt that this did fully clear, now with recurrent symptoms in the last several days, denies fevers or chills).   Review of Systems  Constitutional: Negative.   HENT: Negative.   Eyes: Negative.   Respiratory: Negative for cough, chest tightness and shortness of breath.   Cardiovascular: Negative for chest pain, palpitations and leg swelling.  Gastrointestinal: Negative for abdominal distention, abdominal pain, constipation, diarrhea, nausea and vomiting.  Genitourinary: Positive for dysuria and frequency. Negative for difficulty urinating, flank pain, hematuria and urgency.  Musculoskeletal: Negative.   Skin: Positive for color change, rash and wound.  Neurological: Negative.   Psychiatric/Behavioral: Negative.     Objective:  Physical Exam Constitutional:      Appearance: She is well-developed.  HENT:     Head: Normocephalic and atraumatic.  Cardiovascular:     Rate and Rhythm: Normal rate and regular rhythm.  Pulmonary:     Effort: Pulmonary effort is normal. No respiratory distress.     Breath sounds: Normal breath sounds. No wheezing or rales.  Abdominal:     General: Bowel sounds are normal. There is no distension.     Palpations: Abdomen is soft.     Tenderness: There is no abdominal tenderness. There is no rebound.  Musculoskeletal:        General: Tenderness present.     Cervical back: Normal range of motion.  Skin:    General: Skin is warm and dry.     Comments: Right foot 2nd toe with ulcer on the top  of the toe, with scab covering, some swelling and redness surrounding, prior wound on the midfoot healed from November  Neurological:     Mental Status: She is alert and oriented to person, place, and time.     Coordination: Coordination normal.     Vitals:   05/11/19 1419  BP: 118/78  Pulse: 72  Resp: 16  Temp: 98 F (36.7 C)  TempSrc: Oral  Weight: 140 lb 9.6 oz (63.8 kg)  Height: 4\' 11"  (1.499 m)    This visit occurred during the SARS-CoV-2 public health emergency.  Safety protocols were in place, including screening questions prior to the visit, additional usage of staff PPE, and extensive cleaning of exam room while observing appropriate contact time as indicated for disinfecting solutions.   Assessment & Plan:

## 2019-05-11 NOTE — Assessment & Plan Note (Signed)
Good control on lantus at this time. We will have her monitor closely as infection can change sugar levels and high sugar readings can affect wound healing. Call for <200.

## 2019-05-11 NOTE — Patient Instructions (Signed)
We have sent in bactrim to take 1 pill twice a day for 10 days. Call us with 1-2 days left and let us know how the toe is looking.   We will call if any changes are needed from the urine culture.   We are going to get you in with a foot specialist to make sure that the toe heals.

## 2019-05-11 NOTE — Assessment & Plan Note (Signed)
U/A in office consistent with infection. Urine culture ordered given recent treatment and infection to look for resistances. Rx bactrim for toe infection will cover UTI also.

## 2019-05-11 NOTE — Assessment & Plan Note (Signed)
Referral to podiatry, given the hammertoe this may be difficult to heal. Given ongoing prednisone and diabetes rx bactrim 10 day course today. If worsening may need advanced imaging.

## 2019-05-13 LAB — URINE CULTURE

## 2019-05-15 ENCOUNTER — Ambulatory Visit: Payer: Medicare Other | Admitting: Gastroenterology

## 2019-05-21 DIAGNOSIS — F419 Anxiety disorder, unspecified: Secondary | ICD-10-CM | POA: Diagnosis not present

## 2019-05-21 DIAGNOSIS — F331 Major depressive disorder, recurrent, moderate: Secondary | ICD-10-CM | POA: Diagnosis not present

## 2019-05-28 ENCOUNTER — Other Ambulatory Visit: Payer: Self-pay

## 2019-05-28 ENCOUNTER — Ambulatory Visit (INDEPENDENT_AMBULATORY_CARE_PROVIDER_SITE_OTHER): Payer: Medicare Other | Admitting: Internal Medicine

## 2019-05-28 ENCOUNTER — Encounter: Payer: Self-pay | Admitting: Internal Medicine

## 2019-05-28 VITALS — BP 116/84 | Temp 98.1°F | Ht 59.0 in | Wt 141.0 lb

## 2019-05-28 DIAGNOSIS — R399 Unspecified symptoms and signs involving the genitourinary system: Secondary | ICD-10-CM

## 2019-05-28 LAB — POCT URINALYSIS DIPSTICK
Glucose, UA: NEGATIVE
Protein, UA: POSITIVE — AB
Spec Grav, UA: 1.025 (ref 1.010–1.025)
Urobilinogen, UA: 0.2 E.U./dL
pH, UA: 5.5 (ref 5.0–8.0)

## 2019-05-28 MED ORDER — CEPHALEXIN 500 MG PO CAPS: 500.0000 mg | ORAL_CAPSULE | Freq: Two times a day (BID) | ORAL | 0 refills | Status: DC

## 2019-05-28 NOTE — Progress Notes (Signed)
   Subjective:   Patient ID: Kayla Thompson, female    DOB: 09-Apr-1935, 84 y.o.   MRN: [id number]  HPI The patient is an 84 YO female coming in for concerns about possible uti. Symptoms never actually went away after our last visit although she was treated with 10 day course of bactrim for klebsiella UTI. Having some increase in urination and urgency. She is having some suprapubic pressure. Denies fevers or chills. Overall it is not improving. Has tried increasing fluids. Recent course of bactrim mid-February for concurrent UTI and diabetic foot infection.   Review of Systems  Constitutional: Negative.   Respiratory: Negative.   Cardiovascular: Negative.   Gastrointestinal: Positive for abdominal pain. Negative for abdominal distention, constipation, diarrhea, nausea and vomiting.  Genitourinary: Positive for frequency and urgency. Negative for dysuria.  Musculoskeletal: Negative.   Skin: Negative.     Objective:  Physical Exam Constitutional:      Appearance: She is well-developed.  HENT:     Head: Normocephalic and atraumatic.  Cardiovascular:     Rate and Rhythm: Normal rate and regular rhythm.  Pulmonary:     Effort: Pulmonary effort is normal. No respiratory distress.     Breath sounds: Normal breath sounds. No wheezing or rales.  Abdominal:     General: Bowel sounds are normal. There is no distension.     Palpations: Abdomen is soft.     Tenderness: There is abdominal tenderness. There is no rebound.     Comments: Minimal suprapubic  Musculoskeletal:     Cervical back: Normal range of motion.  Skin:    General: Skin is warm and dry.  Neurological:     Mental Status: She is alert and oriented to person, place, and time.     Coordination: Coordination abnormal.     Comments: walker     Vitals:   05/28/19 1508  BP: 116/84  Temp: 98.1 F (36.7 C)  TempSrc: Oral  Weight: 141 lb (64 kg)  Height: 4\' 11"  (1.499 m)    This visit occurred during the SARS-CoV-2 public  health emergency.  Safety protocols were in place, including screening questions prior to the visit, additional usage of staff PPE, and extensive cleaning of exam room while observing appropriate contact time as indicated for disinfecting solutions.   Assessment & Plan:

## 2019-05-28 NOTE — Patient Instructions (Signed)
We have sent in keflex to take 1 pill twice a day for 1 week.   

## 2019-05-29 DIAGNOSIS — M25511 Pain in right shoulder: Secondary | ICD-10-CM | POA: Diagnosis not present

## 2019-05-29 DIAGNOSIS — Z79899 Other long term (current) drug therapy: Secondary | ICD-10-CM | POA: Diagnosis not present

## 2019-05-29 DIAGNOSIS — M25512 Pain in left shoulder: Secondary | ICD-10-CM | POA: Diagnosis not present

## 2019-05-29 DIAGNOSIS — M353 Polymyalgia rheumatica: Secondary | ICD-10-CM | POA: Diagnosis not present

## 2019-05-29 DIAGNOSIS — M1710 Unilateral primary osteoarthritis, unspecified knee: Secondary | ICD-10-CM | POA: Diagnosis not present

## 2019-05-29 DIAGNOSIS — R399 Unspecified symptoms and signs involving the genitourinary system: Secondary | ICD-10-CM | POA: Insufficient documentation

## 2019-05-29 NOTE — Assessment & Plan Note (Signed)
U/A done in office consistent with persistent infection. Urine culture sent. Rx keflex 1 week and adjust if needed for culture results.

## 2019-05-30 LAB — URINE CULTURE

## 2019-06-05 ENCOUNTER — Ambulatory Visit (INDEPENDENT_AMBULATORY_CARE_PROVIDER_SITE_OTHER): Payer: Medicare Other | Admitting: Podiatry

## 2019-06-05 ENCOUNTER — Other Ambulatory Visit: Payer: Self-pay

## 2019-06-05 ENCOUNTER — Ambulatory Visit (INDEPENDENT_AMBULATORY_CARE_PROVIDER_SITE_OTHER): Payer: Medicare Other

## 2019-06-05 DIAGNOSIS — G8929 Other chronic pain: Secondary | ICD-10-CM

## 2019-06-05 DIAGNOSIS — L84 Corns and callosities: Secondary | ICD-10-CM

## 2019-06-05 DIAGNOSIS — M2041 Other hammer toe(s) (acquired), right foot: Secondary | ICD-10-CM

## 2019-06-05 DIAGNOSIS — M79674 Pain in right toe(s): Secondary | ICD-10-CM | POA: Diagnosis not present

## 2019-06-05 MED ORDER — MUPIROCIN 2 % EX OINT: 1.0000 "application " | TOPICAL_OINTMENT | Freq: Two times a day (BID) | CUTANEOUS | 2 refills | Status: DC

## 2019-06-10 NOTE — Progress Notes (Signed)
Subjective:   Patient ID: Kayla Thompson, female   DOB: 84 y.o.   MRN: [id number]   HPI 84 year old female presents the office today for concerns of her right second toe becoming contracted rubbing the top of her shoe and she has noticed a purple, dark spot on the top of her second toe pointing to the PIPJ.  This been ongoing last 4 months.  She recently just finished a round of Keflex for UTI.  She denies any drainage or pus.  She gets some swelling to the toe.  No redness to the other areas of the digit.   Review of Systems  All other systems reviewed and are negative.  Past Medical History:  Diagnosis Date  . Anxiety   . Arthritis   . Asthma   . Cataracts, bilateral    removed by surgery  . COPD (chronic obstructive pulmonary disease) ([place])   . Depression   . Diabetes mellitus without complication ([place])    type 2  . Dyspnea    with exertion  . Fibromyalgia   . GERD (gastroesophageal reflux disease)   . History of chemotherapy   . History of fractured pelvis   . Hypertension   . Hypothyroidism   . Osteoporosis 12/2017   T score -2.8 stable from prior DEXA  . Osteoporosis   . Ovarian cancer ([place])   . Pneumonia    several times    Past Surgical History:  Procedure Laterality Date  . ABDOMINAL HYSTERECTOMY  1996  . ANKLE FRACTURE SURGERY     plate and screws  . BIOPSY  02/05/2019   Procedure: BIOPSY;  Surgeon: Rush Landmark Telford Nab., MD;  Location: [facility];  Service: Gastroenterology;;  . BLADDER SUSPENSION    . CATARACT EXTRACTION    . COLONOSCOPY    . ESOPHAGEAL BRUSHING  02/05/2019   Procedure: ESOPHAGEAL BRUSHING;  Surgeon: Rush Landmark Telford Nab., MD;  Location: [facility];  Service: Gastroenterology;;  . ESOPHAGOGASTRODUODENOSCOPY (EGD) WITH PROPOFOL N/A 02/05/2019   Procedure: ESOPHAGOGASTRODUODENOSCOPY (EGD) WITH PROPOFOL with dialtion;  Surgeon: Irving Copas., MD;  Location: [facility];  Service: Gastroenterology;  Laterality: N/A;  .  EYE SURGERY     cataracts removed-bilateral  . hip surgey    . knee surgey    . LUNG SURGERY    . OOPHORECTOMY     BSO  . SAVORY DILATION N/A 02/05/2019   Procedure: SAVORY DILATION;  Surgeon: Rush Landmark Telford Nab., MD;  Location: [facility];  Service: Gastroenterology;  Laterality: N/A;  . TONSILLECTOMY    . UPPER GI ENDOSCOPY       Current Outpatient Medications:  .  albuterol (PROAIR HFA) 108 (90 Base) MCG/ACT inhaler, Inhale 2 puffs into the lungs every 6 (six) hours as needed for wheezing or shortness of breath., Disp: 18 g, Rfl: 1 .  albuterol (PROVENTIL HFA;VENTOLIN HFA) 108 (90 Base) MCG/ACT inhaler, Inhale 2 puffs into the lungs every 6 (six) hours as needed for wheezing or shortness of breath., Disp: 1 Inhaler, Rfl: 11 .  amitriptyline (ELAVIL) 10 MG tablet, Take 1 tablet (10 mg total) by mouth at bedtime., Disp: 90 tablet, Rfl: 3 .  baclofen (LIORESAL) 10 MG tablet, TAKE 0.5-1 TABLET BY MOUTH TWICE DAILY AS NEEDED FOR MUSCLE SPASMS OR PAIN, Disp: 180 tablet, Rfl: 1 .  BD PEN NEEDLE NANO U/F 32G X 4 MM MISC, USE 4 TIMES A DAY, Disp: 400 each, Rfl: 1 .  busPIRone (BUSPAR) 5 MG tablet, TAKE 1 TO 2 TABLETS BY  MOUTH TWICE A DAY (Patient taking differently: Take 5-10 mg by mouth daily as needed (anxiety). ), Disp: 360 tablet, Rfl: 1 .  calcium carbonate (OSCAL) 1500 (600 Ca) MG TABS tablet, Take 1,500 mg by mouth 2 (two) times daily with a meal. , Disp: , Rfl:  .  carvedilol (COREG) 3.125 MG tablet, Take 3.125 mg by mouth 2 (two) times daily with a meal., Disp: , Rfl:  .  cephALEXin (KEFLEX) 500 MG capsule, Take 1 capsule (500 mg total) by mouth 2 (two) times daily., Disp: 14 capsule, Rfl: 0 .  Cholecalciferol (VITAMIN D3) 2000 units TABS, Take 2 tablets by mouth daily. , Disp: , Rfl:  .  Dextromethorphan-Guaifenesin (MUCINEX DM MAXIMUM STRENGTH) 60-1200 MG TB12, Take 1 tablet by mouth 2 (two) times daily. Reported on 06/23/2015, Disp: , Rfl:  .  DULoxetine (CYMBALTA) 30 MG capsule,  TAKE 1 CAPSULE BY MOUTH EVERY DAY, Disp: 90 capsule, Rfl: 0 .  DULoxetine (CYMBALTA) 60 MG capsule, Take 1 capsule (60 mg total) by mouth daily., Disp: 90 capsule, Rfl: 3 .  ferrous gluconate (FERGON) 324 MG tablet, TAKE 1 TABLET BY MOUTH EVERY DAY WITH BREAKFAST, Disp: 30 tablet, Rfl: 2 .  Fluticasone-Umeclidin-Vilant (TRELEGY ELLIPTA) 100-62.5-25 MCG/INH AEPB, Inhale 1 puff into the lungs daily. Rinse mouth, Disp: 60 each, Rfl: 11 .  glucose blood (FREESTYLE TEST STRIPS) test strip, Use to test blood sugar 3 times daily. Dx: E11.65, Disp: 100 each, Rfl: 3 .  HYDROcodone-acetaminophen (NORCO/VICODIN) 5-325 MG tablet, Take 1 tablet by mouth 2 (two) times daily as needed., Disp: , Rfl:  .  Insulin Glargine (LANTUS SOLOSTAR) 100 UNIT/ML Solostar Pen, Inject 20 Units into the skin every morning. (Patient taking differently: Inject 15 Units into the skin every morning. ), Disp: 27 pen, Rfl: 1 .  Lancets (FREESTYLE) lancets, Use to test blood sugar 3 times daily. Dx: E11.65, Disp: 100 each, Rfl: 3 .  levothyroxine (SYNTHROID) 50 MCG tablet, TAKE 1 TABLET BY MOUTH EVERY DAY BEFORE BREAKFAST (Patient taking differently: Take 50 mcg by mouth daily before breakfast. ), Disp: 90 tablet, Rfl: 1 .  magnesium oxide (MAG-OX) 400 MG tablet, TAKE 1 TABLET BY MOUTH EVERY DAY, Disp: 90 tablet, Rfl: 1 .  Misc Natural Products (TART CHERRY ADVANCED PO), Take by mouth. Once daily, Disp: , Rfl:  .  montelukast (SINGULAIR) 10 MG tablet, TAKE 1 TABLET BY MOUTH EVERY DAY, Disp: 90 tablet, Rfl: 2 .  Multiple Vitamins-Minerals (CENTRUM ADULTS PO), Take 1 tablet by mouth daily. , Disp: , Rfl:  .  omeprazole (PRILOSEC) 20 MG capsule, TAKE 1 CAPSULE BY MOUTH 2 TIMES A DAY BEFORE A MEAL, Disp: 180 capsule, Rfl: 1 .  Polyethyl Glycol-Propyl Glycol (SYSTANE FREE OP), Apply 1 drop to eye 3 (three) times daily., Disp: , Rfl:  .  predniSONE (DELTASONE) 1 MG tablet, Take 2 tablets (2 mg total) by mouth daily., Disp: 60 tablet, Rfl: 1 .   rosuvastatin (CRESTOR) 10 MG tablet, TAKE 1 TABLET BY MOUTH EVERY DAY IN THE EVENING, Disp: 90 tablet, Rfl: 2 .  traMADol (ULTRAM) 50 MG tablet, Take 1 tablet (50 mg total) by mouth at bedtime as needed for moderate pain., Disp: 20 tablet, Rfl: 0 .  Turmeric (QC TUMERIC COMPLEX PO), Take by mouth., Disp: , Rfl:  .  UNABLE TO FIND, Take 1,000 mg by mouth daily. Med Name: Tumeric, Disp: , Rfl:  .  UNABLE TO FIND, Take 1,000 mg by mouth daily. Med Name: Zannie Cove, Disp: ,  Rfl:  .  vitamin B-12 (CYANOCOBALAMIN) 250 MCG tablet, Take 250 mcg by mouth See admin instructions. Twice weekly, Disp: , Rfl:  .  mupirocin ointment (BACTROBAN) 2 %, Apply 1 application topically 2 (two) times daily., Disp: 30 g, Rfl: 2  Allergies  Allergen Reactions  . Latex Rash  . Penicillins Rash    Has patient had a PCN reaction causing immediate rash, facial/tongue/throat swelling, SOB or lightheadedness with hypotension: No Has patient had a PCN reaction causing severe rash involving mucus membranes or skin necrosis: No Has patient had a PCN reaction that required hospitalization: No Has patient had a PCN reaction occurring within the last 10 years: No If all of the above answers are "NO", then may proceed with Cephalosporin use.        Objective:  Physical Exam  General: AAO x3, NAD  Dermatological: On the dorsal aspect the right second toe is a hyperkeratotic lesion with slight discoloration from where skin rub inside shoes.  There is no underlying ulceration there is no drainage or pus.  Minimal edema.  There is no erythema or warmth there is no ascending cellulitis.  Vascular: Dorsalis Pedis artery and Posterior Tibial artery pedal pulses are 2/4 bilateral with immedate capillary fill time. There is no pain with calf compression, swelling, warmth, erythema.   Neruologic: Grossly intact via light touch bilateral.  Musculoskeletal: Rigid hammertoe contracture present of the second toe on the right side worse  than left.  Gait: Unassisted, Nonantalgic.     Assessment:    Hammertoe deformity resulting in preulcerative callus right foot    Plan:  -Treatment options discussed including all alternatives, risks, and complications -Etiology of symptoms were discussed -Lightly debrided some of the hyperkeratotic tissue without any complications or bleeding.  Recommended antibiotic ointment dressing changes daily and prescribed mupirocin ointment.  Dispensed offloading pads.  Discussed shoe modifications to avoid any pressure. -Monitor for any clinical signs or symptoms of infection and directed to call the office immediately should any occur or go to the ER.  Return in about 3 weeks (around 06/26/2019).  Trula Slade DPM

## 2019-06-11 DIAGNOSIS — F331 Major depressive disorder, recurrent, moderate: Secondary | ICD-10-CM | POA: Diagnosis not present

## 2019-06-11 DIAGNOSIS — F419 Anxiety disorder, unspecified: Secondary | ICD-10-CM | POA: Diagnosis not present

## 2019-06-13 ENCOUNTER — Encounter: Payer: Self-pay | Admitting: Family

## 2019-06-13 ENCOUNTER — Ambulatory Visit (INDEPENDENT_AMBULATORY_CARE_PROVIDER_SITE_OTHER): Payer: Medicare Other | Admitting: Family

## 2019-06-13 ENCOUNTER — Other Ambulatory Visit: Payer: Self-pay

## 2019-06-13 VITALS — BP 126/80 | HR 110 | Temp 98.2°F | Ht 59.0 in | Wt 140.0 lb

## 2019-06-13 DIAGNOSIS — R300 Dysuria: Secondary | ICD-10-CM

## 2019-06-13 DIAGNOSIS — Z87440 Personal history of urinary (tract) infections: Secondary | ICD-10-CM | POA: Diagnosis not present

## 2019-06-13 LAB — POC URINALSYSI DIPSTICK (AUTOMATED)
Glucose, UA: NEGATIVE
Ketones, UA: NEGATIVE
Protein, UA: POSITIVE — AB
Spec Grav, UA: 1.015 (ref 1.010–1.025)
Urobilinogen, UA: 0.2 E.U./dL
pH, UA: 6 (ref 5.0–8.0)

## 2019-06-13 MED ORDER — SULFAMETHOXAZOLE-TRIMETHOPRIM 800-160 MG PO TABS: 1.0000 | ORAL_TABLET | Freq: Two times a day (BID) | ORAL | 0 refills | Status: DC

## 2019-06-13 NOTE — Progress Notes (Signed)
Kayla Thompson is a 84 y.o. female with the following history as recorded in EpicCare:  Patient Active Problem List   Diagnosis Date Noted  . UTI symptoms 05/29/2019  . Dysuria 05/11/2019  . Upper esophageal web 03/27/2019  . Iron deficiency anemia 03/27/2019  . Candida esophagitis ([place]) 02/14/2019  . Encounter for general adult medical examination with abnormal findings 01/09/2019  . Abnormal barium swallow 12/28/2018  . Throat clearing 12/28/2018  . History of colonic polyps 12/28/2018  . Dysphagia 12/28/2018  . Diabetic foot ulcer ([place]) 12/05/2018  . Rotator cuff arthropathy, left 11/16/2018  . Degenerative arthritis of left knee 11/16/2018  . Insomnia 09/26/2018  . Leukocytosis 10/17/2017  . Malnutrition of moderate degree 10/10/2017  . Memory changes 07/29/2017  . Granulomatous lung disease ([place]) 06/14/2017  . Tracheobronchomalacia 06/14/2017  . Dizziness 03/04/2017  . Exercise hypoxemia 07/12/2016  . Diastolic CHF, chronic ([place]) 12/25/2015  . GERD (gastroesophageal reflux disease) 12/25/2015  . Epidermal inclusion cyst 09/02/2015  . Type 2 diabetes mellitus with hyperglycemia, with long-term current use of insulin ([place]) 03/10/2015  . Muscle cramps 03/06/2015  . Bronchiectasis without acute exacerbation ([place]) 01/07/2015  . PMR (polymyalgia rheumatica) ([place]) 01/07/2015  . Osteoarthritis 01/07/2015  . Asthma, chronic 11/12/2014  . Anxiety state 11/12/2014  . Hyperparathyroidism ([place]) 10/28/2014  . Fibromyalgia 09/21/2014  . Personal history of ovarian cancer 08/23/2014  . Osteoporosis 08/23/2014  . Hypertension 08/02/2014  . HLD (hyperlipidemia) 08/02/2014  . Hypothyroidism 08/02/2014    Current Outpatient Medications  Medication Sig Dispense Refill  . albuterol (PROAIR HFA) 108 (90 Base) MCG/ACT inhaler Inhale 2 puffs into the lungs every 6 (six) hours as needed for wheezing or shortness of breath. 18 g 1  . albuterol (PROVENTIL HFA;VENTOLIN HFA) 108 (90 Base)  MCG/ACT inhaler Inhale 2 puffs into the lungs every 6 (six) hours as needed for wheezing or shortness of breath. 1 Inhaler 11  . amitriptyline (ELAVIL) 10 MG tablet Take 1 tablet (10 mg total) by mouth at bedtime. 90 tablet 3  . baclofen (LIORESAL) 10 MG tablet TAKE 0.5-1 TABLET BY MOUTH TWICE DAILY AS NEEDED FOR MUSCLE SPASMS OR PAIN 180 tablet 1  . BD PEN NEEDLE NANO U/F 32G X 4 MM MISC USE 4 TIMES A DAY 400 each 1  . busPIRone (BUSPAR) 5 MG tablet TAKE 1 TO 2 TABLETS BY MOUTH TWICE A DAY (Patient taking differently: Take 5-10 mg by mouth daily as needed (anxiety). ) 360 tablet 1  . calcium carbonate (OSCAL) 1500 (600 Ca) MG TABS tablet Take 1,500 mg by mouth 2 (two) times daily with a meal.     . carvedilol (COREG) 3.125 MG tablet Take 3.125 mg by mouth 2 (two) times daily with a meal.    . cephALEXin (KEFLEX) 500 MG capsule Take 1 capsule (500 mg total) by mouth 2 (two) times daily. 14 capsule 0  . Cholecalciferol (VITAMIN D3) 2000 units TABS Take 2 tablets by mouth daily.     Marland Kitchen Dextromethorphan-Guaifenesin (MUCINEX DM MAXIMUM STRENGTH) 60-1200 MG TB12 Take 1 tablet by mouth 2 (two) times daily. Reported on 06/23/2015    . DULoxetine (CYMBALTA) 30 MG capsule TAKE 1 CAPSULE BY MOUTH EVERY DAY 90 capsule 0  . DULoxetine (CYMBALTA) 60 MG capsule Take 1 capsule (60 mg total) by mouth daily. 90 capsule 3  . ferrous gluconate (FERGON) 324 MG tablet TAKE 1 TABLET BY MOUTH EVERY DAY WITH BREAKFAST 30 tablet 2  . Fluticasone-Umeclidin-Vilant (TRELEGY ELLIPTA) 100-62.5-25 MCG/INH AEPB Inhale  1 puff into the lungs daily. Rinse mouth 60 each 11  . glucose blood (FREESTYLE TEST STRIPS) test strip Use to test blood sugar 3 times daily. Dx: E11.65 100 each 3  . HYDROcodone-acetaminophen (NORCO/VICODIN) 5-325 MG tablet Take 1 tablet by mouth 2 (two) times daily as needed.    . Insulin Glargine (LANTUS SOLOSTAR) 100 UNIT/ML Solostar Pen Inject 20 Units into the skin every morning. (Patient taking differently:  Inject 15 Units into the skin every morning. ) 27 pen 1  . Lancets (FREESTYLE) lancets Use to test blood sugar 3 times daily. Dx: E11.65 100 each 3  . levothyroxine (SYNTHROID) 50 MCG tablet TAKE 1 TABLET BY MOUTH EVERY DAY BEFORE BREAKFAST (Patient taking differently: Take 50 mcg by mouth daily before breakfast. ) 90 tablet 1  . lidocaine (LIDODERM) 5 % SMARTSIG:1-3 Patch(s) Topical Every 12 Hours PRN    . magnesium oxide (MAG-OX) 400 MG tablet TAKE 1 TABLET BY MOUTH EVERY DAY 90 tablet 1  . Misc Natural Products (TART CHERRY ADVANCED PO) Take by mouth. Once daily    . montelukast (SINGULAIR) 10 MG tablet TAKE 1 TABLET BY MOUTH EVERY DAY 90 tablet 2  . Multiple Vitamins-Minerals (CENTRUM ADULTS PO) Take 1 tablet by mouth daily.     . mupirocin ointment (BACTROBAN) 2 % Apply 1 application topically 2 (two) times daily. 30 g 2  . omeprazole (PRILOSEC) 20 MG capsule TAKE 1 CAPSULE BY MOUTH 2 TIMES A DAY BEFORE A MEAL 180 capsule 1  . Polyethyl Glycol-Propyl Glycol (SYSTANE FREE OP) Apply 1 drop to eye 3 (three) times daily.    . predniSONE (DELTASONE) 1 MG tablet Take 2 tablets (2 mg total) by mouth daily. 60 tablet 1  . rosuvastatin (CRESTOR) 10 MG tablet TAKE 1 TABLET BY MOUTH EVERY DAY IN THE EVENING 90 tablet 2  . traMADol (ULTRAM) 50 MG tablet Take 1 tablet (50 mg total) by mouth at bedtime as needed for moderate pain. 20 tablet 0  . Turmeric (QC TUMERIC COMPLEX PO) Take by mouth.    Marland Kitchen UNABLE TO FIND Take 1,000 mg by mouth daily. Med Name: Tumeric    . UNABLE TO FIND Take 1,000 mg by mouth daily. Med Name: Zannie Cove    . vitamin B-12 (CYANOCOBALAMIN) 250 MCG tablet Take 250 mcg by mouth See admin instructions. Twice weekly    . sulfamethoxazole-trimethoprim (BACTRIM DS) 800-160 MG tablet Take 1 tablet by mouth 2 (two) times daily. 10 tablet 0   No current facility-administered medications for this visit.    Allergies: Latex and Penicillins  Past Medical History:  Diagnosis Date  .  Anxiety   . Arthritis   . Asthma   . Cataracts, bilateral    removed by surgery  . COPD (chronic obstructive pulmonary disease) ([place])   . Depression   . Diabetes mellitus without complication ([place])    type 2  . Dyspnea    with exertion  . Fibromyalgia   . GERD (gastroesophageal reflux disease)   . History of chemotherapy   . History of fractured pelvis   . Hypertension   . Hypothyroidism   . Osteoporosis 12/2017   T score -2.8 stable from prior DEXA  . Osteoporosis   . Ovarian cancer ([place])   . Pneumonia    several times    Past Surgical History:  Procedure Laterality Date  . ABDOMINAL HYSTERECTOMY  1996  . ANKLE FRACTURE SURGERY     plate and screws  . BIOPSY  02/05/2019   Procedure: BIOPSY;  Surgeon: Irving Copas., MD;  Location: [facility];  Service: Gastroenterology;;  . BLADDER SUSPENSION    . CATARACT EXTRACTION    . COLONOSCOPY    . ESOPHAGEAL BRUSHING  02/05/2019   Procedure: ESOPHAGEAL BRUSHING;  Surgeon: Rush Landmark Telford Nab., MD;  Location: [facility];  Service: Gastroenterology;;  . ESOPHAGOGASTRODUODENOSCOPY (EGD) WITH PROPOFOL N/A 02/05/2019   Procedure: ESOPHAGOGASTRODUODENOSCOPY (EGD) WITH PROPOFOL with dialtion;  Surgeon: Irving Copas., MD;  Location: [facility];  Service: Gastroenterology;  Laterality: N/A;  . EYE SURGERY     cataracts removed-bilateral  . hip surgey    . knee surgey    . LUNG SURGERY    . OOPHORECTOMY     BSO  . SAVORY DILATION N/A 02/05/2019   Procedure: SAVORY DILATION;  Surgeon: Rush Landmark Telford Nab., MD;  Location: [facility];  Service: Gastroenterology;  Laterality: N/A;  . TONSILLECTOMY    . UPPER GI ENDOSCOPY      Family History  Problem Relation Age of Onset  . Lung cancer Mother   . CAD Father   . Diabetes Father   . Lung cancer Maternal Aunt   . Diabetes Paternal Aunt   . Diabetes Paternal Uncle   . Diabetes Paternal Grandmother   . Diabetes Paternal Grandfather   . Colon cancer  Neg Hx   . Esophageal cancer Neg Hx   . Inflammatory bowel disease Neg Hx   . Liver disease Neg Hx   . Pancreatic cancer Neg Hx   . Rectal cancer Neg Hx   . Stomach cancer Neg Hx     Social History   Tobacco Use  . Smoking status: Never Smoker  . Smokeless tobacco: Never Used  Substance Use Topics  . Alcohol use: No    Alcohol/week: 0.0 standard drinks    Subjective:  Treated for a UTI earlier this month; is concerned that symptoms have returned in the past few days; culture did show Klebsiella Pneumonia but was treated with appropriate antibiotic; feels that as soon as she finishes the antibiotic, the symptoms return almost immediately;  Had UTI in January, February as well;  Denies any fever or blood in urine;    Objective:  Vitals:   06/13/19 1206  BP: 126/80  Pulse: (!) 110  Temp: 98.2 F (36.8 C)  TempSrc: Oral  SpO2: 96%  Weight: 140 lb (63.5 kg)  Height: 4\' 11"  (1.499 m)    General: Well developed, well nourished, in no acute distress  Skin : Warm and dry.  Head: Normocephalic and atraumatic  Lungs: Respirations unlabored;  CVS exam: normal rate and regular rhythm.  Neurologic: Alert and oriented; speech intact; face symmetrical; uses walker  Assessment:  1. History of recurrent UTIs   2. Dysuria     Plan:  Re-check urine culture today; Rx for Bactrim DS bid x 5 days since she took Keflex earlier this month;  Will also refer to urology for further evaluation due to recurrent/ persisting symptoms.  This visit occurred during the SARS-CoV-2 public health emergency.  Safety protocols were in place, including screening questions prior to the visit, additional usage of staff PPE, and extensive cleaning of exam room while observing appropriate contact time as indicated for disinfecting solutions.     No follow-ups on file.  Orders Placed This Encounter  Procedures  . Urine Culture    Standing Status:   Future    Standing Expiration Date:   06/12/2020  .  Ambulatory referral  to Urology    Referral Priority:   Routine    Referral Type:   Consultation    Referral Reason:   Specialty Services Required    Requested Specialty:   Urology    Number of Visits Requested:   1    Requested Prescriptions   Signed Prescriptions Disp Refills  . sulfamethoxazole-trimethoprim (BACTRIM DS) 800-160 MG tablet 10 tablet 0    Sig: Take 1 tablet by mouth 2 (two) times daily.

## 2019-06-13 NOTE — Addendum Note (Signed)
Addended by: Marcina Millard on: 06/13/2019 03:33 PM   Modules accepted: Orders

## 2019-06-14 DIAGNOSIS — R300 Dysuria: Secondary | ICD-10-CM | POA: Diagnosis not present

## 2019-06-14 DIAGNOSIS — M19012 Primary osteoarthritis, left shoulder: Secondary | ICD-10-CM | POA: Diagnosis not present

## 2019-06-14 DIAGNOSIS — M19011 Primary osteoarthritis, right shoulder: Secondary | ICD-10-CM | POA: Diagnosis not present

## 2019-06-14 NOTE — Addendum Note (Signed)
Addended by: Cresenciano Lick on: 06/14/2019 08:48 AM   Modules accepted: Orders

## 2019-06-16 LAB — URINE CULTURE

## 2019-06-18 ENCOUNTER — Telehealth: Payer: Self-pay

## 2019-06-18 DIAGNOSIS — E039 Hypothyroidism, unspecified: Secondary | ICD-10-CM

## 2019-06-18 MED ORDER — LEVOTHYROXINE SODIUM 50 MCG PO TABS: ORAL_TABLET | ORAL | 1 refills | Status: DC

## 2019-06-18 NOTE — Telephone Encounter (Signed)
1.Medication Requested:levothyroxine (SYNTHROID) 50 MCG tablet  2. Pharmacy (Name, Street, City):[id number] [place], [place]  3. On Med List: Yes   4. Last Visit with PCP: 3.24.21  5. Next visit date with PCP: no appt is made at this time     Agent: Please be advised that RX refills may take up to 3 business days. We ask that you follow-up with your pharmacy.

## 2019-06-20 ENCOUNTER — Telehealth: Payer: Self-pay | Admitting: Internal Medicine

## 2019-06-20 NOTE — Telephone Encounter (Signed)
Insurance has been submitted and verified for Prolia. Patient is responsible for a $0 copay. Due anytime.  Spoke to patient. She will call back to schedule.  Okay to schedule... Visit Note: Prolia ($0 copay - okay to give per Gareth Eagle) Visit Type: Nurse Provider: Nurse

## 2019-06-25 ENCOUNTER — Other Ambulatory Visit: Payer: Self-pay

## 2019-06-25 NOTE — Telephone Encounter (Signed)
Medication Refill - Medication:  1. Carzedilol 6.25 mg 90 day supply.  2. Rosuzastatin Calcium 10 mg. 90 day supply  Has the patient contacted their pharmacy? Yes.   (Agent: If no, request that the patient contact the pharmacy for the refill.) (Agent: If yes, when and what did the pharmacy advise?)  Preferred Pharmacy (with phone number or street name): [id number] [place], [place]: Please be advised that RX refills may take up to 3 business days. We ask that you follow-up with your pharmacy.

## 2019-06-26 ENCOUNTER — Other Ambulatory Visit: Payer: Self-pay

## 2019-06-26 ENCOUNTER — Other Ambulatory Visit: Payer: Self-pay | Admitting: Gastroenterology

## 2019-06-26 ENCOUNTER — Encounter: Payer: Self-pay | Admitting: Podiatry

## 2019-06-26 ENCOUNTER — Ambulatory Visit (INDEPENDENT_AMBULATORY_CARE_PROVIDER_SITE_OTHER): Payer: Medicare Other | Admitting: Podiatry

## 2019-06-26 DIAGNOSIS — L02611 Cutaneous abscess of right foot: Secondary | ICD-10-CM

## 2019-06-26 DIAGNOSIS — M2041 Other hammer toe(s) (acquired), right foot: Secondary | ICD-10-CM | POA: Diagnosis not present

## 2019-06-26 DIAGNOSIS — L97501 Non-pressure chronic ulcer of other part of unspecified foot limited to breakdown of skin: Secondary | ICD-10-CM

## 2019-06-26 DIAGNOSIS — L97511 Non-pressure chronic ulcer of other part of right foot limited to breakdown of skin: Secondary | ICD-10-CM

## 2019-06-26 DIAGNOSIS — L03031 Cellulitis of right toe: Secondary | ICD-10-CM

## 2019-06-26 MED ORDER — DOXYCYCLINE HYCLATE 100 MG PO TABS: 100.0000 mg | ORAL_TABLET | Freq: Two times a day (BID) | ORAL | 0 refills | Status: DC

## 2019-06-26 NOTE — Patient Instructions (Signed)
Start the doxycycline (the antibiotic) Apply a small amount of antibiotic ointment and a bandage to the toe daily.  Wear the surgical shoe to keep the pressure off of the toe.

## 2019-06-27 DIAGNOSIS — M25511 Pain in right shoulder: Secondary | ICD-10-CM | POA: Diagnosis not present

## 2019-06-27 DIAGNOSIS — M25512 Pain in left shoulder: Secondary | ICD-10-CM | POA: Diagnosis not present

## 2019-06-27 MED ORDER — CARVEDILOL 3.125 MG PO TABS: 3.1250 mg | ORAL_TABLET | Freq: Two times a day (BID) | ORAL | 1 refills | Status: DC

## 2019-06-27 MED ORDER — ROSUVASTATIN CALCIUM 10 MG PO TABS: 10.0000 mg | ORAL_TABLET | Freq: Every evening | ORAL | 1 refills | Status: DC

## 2019-06-27 NOTE — Telephone Encounter (Signed)
erx has been sent.  

## 2019-06-28 DIAGNOSIS — N3000 Acute cystitis without hematuria: Secondary | ICD-10-CM | POA: Diagnosis not present

## 2019-06-28 NOTE — Progress Notes (Signed)
Subjective: 84 year old female presents the office today with a caregiver for follow-up evaluation of skin lesion on the top of the right second toe.  She states the pads were not helpful.  She still is tenderness to the top of the toe.  She has ordered new shoes with a taller toe box but they have not come in yet.  She denies any drainage or pus.  No increase in swelling or redness of the foot.  No recent injury or changes. Denies any systemic complaints such as fevers, chills, nausea, vomiting. No acute changes since last appointment, and no other complaints at this time.   Objective: AAO x3, NAD-wearing a regular shoe using a walker DP/PT pulses palpable bilaterally, CRT less than 3 seconds Rigid hammertoe contracture present of the right second toe resulting in a hyperkeratotic lesion the dorsal PIPJ.  Upon debridement superficial wound is present.  There is localized edema erythema there is no ascending cellulitis.  There is no fluctuation or crepitation.  There is no malodor.  No other open lesions are identified.  No open lesions or pre-ulcerative lesions.  No pain with calf compression, swelling, warmth, erythema  Assessment: Hammertoe contracture right second toe resulting in wound dorsal second PIPJ  Plan: -All treatment options discussed with the patient including all alternatives, risks, complications.  -Debrided the hyperkeratotic lesion today to reveal the ulceration which was debrided to healthy, viable tissue utilizing #312 with scalpel.  Continue with mupirocin, dressing changes daily.  Prescribe doxycycline.  Surgical shoe was dispensed.  We watched her walk out of the office and she was stable.  Discussed with her and the caregiver if there is any instability to not wear the surgical shoe to prevent falls. -Patient encouraged to call the office with any questions, concerns, change in symptoms.   Trula Slade DPM

## 2019-07-02 ENCOUNTER — Other Ambulatory Visit: Payer: Self-pay | Admitting: Internal Medicine

## 2019-07-02 ENCOUNTER — Telehealth: Payer: Self-pay | Admitting: Internal Medicine

## 2019-07-02 DIAGNOSIS — J479 Bronchiectasis, uncomplicated: Secondary | ICD-10-CM

## 2019-07-02 MED ORDER — ROSUVASTATIN CALCIUM 10 MG PO TABS: 10.0000 mg | ORAL_TABLET | Freq: Every evening | ORAL | 1 refills | Status: DC

## 2019-07-02 MED ORDER — CARVEDILOL 3.125 MG PO TABS: 3.1250 mg | ORAL_TABLET | Freq: Two times a day (BID) | ORAL | 1 refills | Status: DC

## 2019-07-02 MED ORDER — TRELEGY ELLIPTA 100-62.5-25 MCG/INH IN AEPB: 1.0000 | INHALATION_SPRAY | Freq: Every day | RESPIRATORY_TRACT | 11 refills | Status: DC

## 2019-07-02 NOTE — Telephone Encounter (Signed)
New message:   1.Medication Requested: carvedilol (COREG) 6.25 mg tablet rosuvastatin (CRESTOR) 10 MG tablet 2. Pharmacy (Name, Street, [place]): [id number] [place] [place] 3. On Med List: Yes  4. Last Visit with PCP: 06/13/19  5. Next visit date with PCP:   Agent: Please be advised that RX refills may take up to 3 business days. We ask that you follow-up with your pharmacy.

## 2019-07-02 NOTE — Telephone Encounter (Signed)
Duplicate request refill was sent on 06/27/19 notified pt med was sent Pt states pharmacy states they did not [facility]

## 2019-07-02 NOTE — Telephone Encounter (Signed)
Spoke with pt and advised [facility]. Nothing further is needed.

## 2019-07-03 DIAGNOSIS — M353 Polymyalgia rheumatica: Secondary | ICD-10-CM | POA: Diagnosis not present

## 2019-07-03 DIAGNOSIS — M79671 Pain in right foot: Secondary | ICD-10-CM | POA: Diagnosis not present

## 2019-07-03 DIAGNOSIS — N183 Chronic kidney disease, stage 3 unspecified: Secondary | ICD-10-CM | POA: Diagnosis not present

## 2019-07-03 DIAGNOSIS — Z79899 Other long term (current) drug therapy: Secondary | ICD-10-CM | POA: Diagnosis not present

## 2019-07-03 DIAGNOSIS — M810 Age-related osteoporosis without current pathological fracture: Secondary | ICD-10-CM | POA: Diagnosis not present

## 2019-07-06 DIAGNOSIS — R351 Nocturia: Secondary | ICD-10-CM | POA: Diagnosis not present

## 2019-07-06 DIAGNOSIS — N3000 Acute cystitis without hematuria: Secondary | ICD-10-CM | POA: Diagnosis not present

## 2019-07-09 DIAGNOSIS — M810 Age-related osteoporosis without current pathological fracture: Secondary | ICD-10-CM | POA: Diagnosis not present

## 2019-07-12 ENCOUNTER — Ambulatory Visit (INDEPENDENT_AMBULATORY_CARE_PROVIDER_SITE_OTHER): Payer: Medicare Other | Admitting: Podiatry

## 2019-07-12 ENCOUNTER — Other Ambulatory Visit: Payer: Self-pay

## 2019-07-12 DIAGNOSIS — M2041 Other hammer toe(s) (acquired), right foot: Secondary | ICD-10-CM | POA: Diagnosis not present

## 2019-07-12 DIAGNOSIS — L97501 Non-pressure chronic ulcer of other part of unspecified foot limited to breakdown of skin: Secondary | ICD-10-CM | POA: Diagnosis not present

## 2019-07-13 NOTE — Progress Notes (Signed)
Subjective: 84 year old female presents she did not take the antibiotic I prescribed and she was given something for UTI currently.  She has been wearing the surgical shoe.  Denies any new concerns. Denies any systemic complaints such as fevers, chills, nausea, vomiting. No acute changes since last appointment, and no other complaints at this time.   Objective: AAO x3, NAD DP/PT pulses palpable bilaterally, CRT less than 3 seconds Semirigid hammertoe contracture present of the right second toe resulted in hyperkeratotic lesion along the dorsal PIPJ.  Upon debridement small pinpoint wound is open all small amount of clear drainage expressed there is no purulence.  Mild erythema of the dorsal PIPJ but there is no ascending cellulitis.  There is no fluctuation or crepitation.  There is no malodor.  Overall it appears to be improved.  No open lesions or pre-ulcerative lesions.  No pain with calf compression, swelling, warmth, erythema  Assessment:Right second toe ulceration due to hammertoe contracture  Plan: -All treatment options discussed with the patient including all alternatives, risks, complications.  -Debrided hyperkeratotic lesion to reveal a small pinpoint ulcer.  We will switch to Iodosorb dressing changes daily.  This was dispensed today.  Continue surgical shoe and offloading. -Monitor for any clinical signs or symptoms of infection and directed to call the office immediately should any occur or go to the ER. -Patient encouraged to call the office with any questions, concerns, change in symptoms.   Trula Slade DPM

## 2019-07-20 ENCOUNTER — Other Ambulatory Visit: Payer: Self-pay | Admitting: Internal Medicine

## 2019-07-23 DIAGNOSIS — F419 Anxiety disorder, unspecified: Secondary | ICD-10-CM | POA: Diagnosis not present

## 2019-07-23 DIAGNOSIS — F331 Major depressive disorder, recurrent, moderate: Secondary | ICD-10-CM | POA: Diagnosis not present

## 2019-07-26 ENCOUNTER — Other Ambulatory Visit: Payer: Self-pay

## 2019-07-26 ENCOUNTER — Ambulatory Visit (INDEPENDENT_AMBULATORY_CARE_PROVIDER_SITE_OTHER): Payer: Medicare Other | Admitting: Podiatry

## 2019-07-26 ENCOUNTER — Encounter: Payer: Self-pay | Admitting: Podiatry

## 2019-07-26 DIAGNOSIS — L97501 Non-pressure chronic ulcer of other part of unspecified foot limited to breakdown of skin: Secondary | ICD-10-CM | POA: Diagnosis not present

## 2019-07-26 DIAGNOSIS — M2041 Other hammer toe(s) (acquired), right foot: Secondary | ICD-10-CM

## 2019-07-31 ENCOUNTER — Other Ambulatory Visit: Payer: Self-pay | Admitting: Internal Medicine

## 2019-08-01 NOTE — Progress Notes (Signed)
Subjective: 84 year old female presents to the office today for evaluation of right second toe ulceration due to hammertoe contracture.  Overall she states that she is feeling a little bit better and is not as red.  She does not notice any drainage or pus.  She has no other concerns today.  Denies any fevers, chills, nausea, vomiting.  No calf pain, chest pain, shortness of breath.  Objective: AAO x3, NAD DP/PT pulses palpable bilaterally, CRT less than 3 seconds Semirigid hammertoe contracture present of the right second toe resulting in a hyperkeratotic lesion on the dorsal PIPJ.  Upon debridement small superficial ulcer is still evident but is almost healed and almost pinpoint size.  There is no drainage or pus identified.  Mild chronic erythema but thinks is more from irritation.  There is no swelling or warmth.  No ascending cellulitis. No pain with calf compression, swelling, warmth, erythema  Assessment: Right second toe ulceration due to hammertoe contracture-improvement  Plan: -All treatment options discussed with the patient including all alternatives, risks, complications.  -Debrided hyperkeratotic lesion to reveal a small superficial ulcer.  She will continue with daily dressings with Iodosorb dressing changes daily.  Continue with surgical shoe for offloading. -Monitor for any clinical signs or symptoms of infection and directed to call the office immediately should any occur or go to the ER. -Patient encouraged to call the office with any questions, concerns, change in symptoms.   Trula Slade DPM

## 2019-08-02 DIAGNOSIS — Z79899 Other long term (current) drug therapy: Secondary | ICD-10-CM | POA: Diagnosis not present

## 2019-08-02 DIAGNOSIS — E119 Type 2 diabetes mellitus without complications: Secondary | ICD-10-CM | POA: Diagnosis not present

## 2019-08-02 DIAGNOSIS — N390 Urinary tract infection, site not specified: Secondary | ICD-10-CM | POA: Diagnosis not present

## 2019-08-02 DIAGNOSIS — N183 Chronic kidney disease, stage 3 unspecified: Secondary | ICD-10-CM | POA: Diagnosis not present

## 2019-08-02 DIAGNOSIS — M353 Polymyalgia rheumatica: Secondary | ICD-10-CM | POA: Diagnosis not present

## 2019-08-02 DIAGNOSIS — R350 Frequency of micturition: Secondary | ICD-10-CM | POA: Diagnosis not present

## 2019-08-08 DIAGNOSIS — F331 Major depressive disorder, recurrent, moderate: Secondary | ICD-10-CM | POA: Diagnosis not present

## 2019-08-08 DIAGNOSIS — F419 Anxiety disorder, unspecified: Secondary | ICD-10-CM | POA: Diagnosis not present

## 2019-08-09 ENCOUNTER — Ambulatory Visit (INDEPENDENT_AMBULATORY_CARE_PROVIDER_SITE_OTHER): Payer: Medicare Other | Admitting: Podiatry

## 2019-08-09 ENCOUNTER — Other Ambulatory Visit: Payer: Self-pay

## 2019-08-09 ENCOUNTER — Encounter: Payer: Self-pay | Admitting: Podiatry

## 2019-08-09 DIAGNOSIS — M2041 Other hammer toe(s) (acquired), right foot: Secondary | ICD-10-CM | POA: Diagnosis not present

## 2019-08-09 DIAGNOSIS — L97501 Non-pressure chronic ulcer of other part of unspecified foot limited to breakdown of skin: Secondary | ICD-10-CM | POA: Diagnosis not present

## 2019-08-11 NOTE — Progress Notes (Signed)
Subjective: 84 year old female presents to the office today for evaluation of right second toe ulceration due to hammertoe contracture.  States that she is doing better.  Still has a small opening as well as some redness but no drainage or pus or any red streaks.  She is eager to get a pedicure. Denies any fevers, chills, nausea, vomiting.  No calf pain, chest pain, shortness of breath.  Objective: AAO x3, NAD DP/PT pulses palpable bilaterally, CRT less than 3 seconds Hammertoe contracture present of the right second toe resulting in a hyperkeratotic lesion on the dorsal PIPJ.  After debridement of the hyperkeratotic tissue there is a small pinpoint superficial abrasion present.  There is mild surrounding edema and erythema there is no drainage or pus or any fluctuation crepitation.  No malodor.  No other signs of infection. No pain with calf compression, swelling, warmth, erythema  Assessment: Right second toe ulceration due to hammertoe contracture-improvement  Plan: -All treatment options discussed with the patient including all alternatives, risks, complications.  -Debrided the hyperkeratotic tissue to reveal the small superficial pinpoint wound.  Continue with the Iodosorb dressing changes daily.  Continue offloading.  Recommend to hold off on pedicures for now until the wound completely heals.  Return in about 3 weeks (around 08/30/2019).  Trula Slade DPM

## 2019-08-22 DIAGNOSIS — M19212 Secondary osteoarthritis, left shoulder: Secondary | ICD-10-CM | POA: Diagnosis not present

## 2019-08-22 DIAGNOSIS — M19211 Secondary osteoarthritis, right shoulder: Secondary | ICD-10-CM | POA: Diagnosis not present

## 2019-08-24 DIAGNOSIS — M2550 Pain in unspecified joint: Secondary | ICD-10-CM | POA: Diagnosis not present

## 2019-08-24 DIAGNOSIS — M810 Age-related osteoporosis without current pathological fracture: Secondary | ICD-10-CM | POA: Diagnosis not present

## 2019-08-24 DIAGNOSIS — Z8739 Personal history of other diseases of the musculoskeletal system and connective tissue: Secondary | ICD-10-CM | POA: Diagnosis not present

## 2019-08-24 DIAGNOSIS — M797 Fibromyalgia: Secondary | ICD-10-CM | POA: Diagnosis not present

## 2019-08-24 DIAGNOSIS — M79643 Pain in unspecified hand: Secondary | ICD-10-CM | POA: Diagnosis not present

## 2019-08-24 DIAGNOSIS — M25519 Pain in unspecified shoulder: Secondary | ICD-10-CM | POA: Diagnosis not present

## 2019-08-24 DIAGNOSIS — R700 Elevated erythrocyte sedimentation rate: Secondary | ICD-10-CM | POA: Diagnosis not present

## 2019-08-24 DIAGNOSIS — M1990 Unspecified osteoarthritis, unspecified site: Secondary | ICD-10-CM | POA: Diagnosis not present

## 2019-08-24 DIAGNOSIS — Z7952 Long term (current) use of systemic steroids: Secondary | ICD-10-CM | POA: Diagnosis not present

## 2019-08-27 ENCOUNTER — Ambulatory Visit (INDEPENDENT_AMBULATORY_CARE_PROVIDER_SITE_OTHER): Payer: Medicare Other | Admitting: Internal Medicine

## 2019-08-27 ENCOUNTER — Other Ambulatory Visit: Payer: Self-pay

## 2019-08-27 ENCOUNTER — Encounter: Payer: Self-pay | Admitting: Internal Medicine

## 2019-08-27 DIAGNOSIS — I5032 Chronic diastolic (congestive) heart failure: Secondary | ICD-10-CM

## 2019-08-27 DIAGNOSIS — J479 Bronchiectasis, uncomplicated: Secondary | ICD-10-CM

## 2019-08-27 DIAGNOSIS — K219 Gastro-esophageal reflux disease without esophagitis: Secondary | ICD-10-CM | POA: Diagnosis not present

## 2019-08-27 MED ORDER — ALBUTEROL SULFATE HFA 108 (90 BASE) MCG/ACT IN AERS: 2.0000 | INHALATION_SPRAY | Freq: Four times a day (QID) | RESPIRATORY_TRACT | 12 refills | Status: DC | PRN

## 2019-08-27 NOTE — Progress Notes (Signed)
HPI F never smoker followed for asthma/ COPD/ Tracheobronchomalacia,Bronchiectasis, Granulomatous lung disease,  Chronic Hypoxic Resp Failure,  GERD, complicated by  DM2, Hypothyroid, HBP, dCHF Office Spirometry 01/07/15- Mild obstructive airways disease- FVC 7.2/ 100%, FEV1 1.4/ 83%, FEV1/FVC 0.62, FEF25-75% 44% PFT 02/26/2019- Mild obstruction, Diffusion moderately reduced, no resp to BD, -----------------------------------------------------------------------------------------  10/04/2018-  84 yo F never smoker seeing me to establish for pulmonary care. former SN pt, referred by Dr. Sharlet Salina (PCP) for asthma and COPD, reports increased DOE Medical problem list includes HBP, dCHF, Tracheobronchomalacia, Granulomatous lung disease, Bronchiectasis, Asthma, GERD, DM2, Hypothyroid, Hyperparathyroidism, Osteoporosis, PMR,  Dr Valeta Harms: "PMH of asthma dx 20+ years ago, takes advair 500/50 and as needed albuterol. Started on chronic prednisone 2mg  every day for the past 20 years. Started by Dr. Lollie Marrow in [place].  She has been followed by Dr. Lenna Gilford for ongoing shortness of breath and her asthma.  She is currently managed with Advair and Spiriva.  Prior pulmonary function tests with no evidence of obstruction.  She does however have cylindric and varicoid bronchiectasis on CT imaging.  Her last axial CT imaging was in 2016.  She did have a episode of pneumonia in July 2019.  She was treated with antibiotics during this time and had a follow-up chest imaging with an x-ray in August 2019 which showed resolution.  She was seen by 1 of our nurse practitioners, Lazaro Arms a few weeks ago.  She was treated for a bronchiectasis exacerbation as she complained of chest tightness wheezing and congestion.  She got antibiotics and steroids to include Doxy and Medrol Dosepak but never took any of the medications.  She said she started to feel better after she left the office." -----former SN pt, referred by Dr. Sharlet Salina (PCP) for  asthma and COPD, reports increased DOE She wishes to establish with me for pulmonary care, and tells me she is at baseline. Routine cough, usually worst in the morning then minor through the day. Clear to darker nonbloody sputum, usually scant. Has not had a fluttervalve or pneumatic vest. Dyspnea walking, relieved by rest. O2 2L/ [employer]- Has oxygen concentrator and POC. Doesn't use concentrator much due to loud, causes headaches.  Rescue inhaler 1-2x/ week, Spiriva, Advair 500 and maintenance prednisone routinely, Nebulizer 2-3x daily. Pending ENT for swallowing difficulty.  Pending L shoulder surgery. Office Spirometry 01/07/15- Mild obstructive airways disease- FVC 7.2/ 100%, FEV1 1.4/ 83%, FEV1/FVC 0.62, FEF25-75% 44% CXR 11/07/17- IMPRESSION: Resolved LEFT LOWER lung pneumonia.  No acute abnormality.  02/26/2019- 83 yoF never smoker followed for asthma/ COPD/ Tracheobronchomalacia,Bronchiectasis, Granulomatous lung disease,  GERD, complicated by  DM2, Hypothyroid, HBP, dCHF,  O2 2L [employer],  Rescue inhaler 1-2x/ week, Spiriva, Advair 500 and maintenance prednisone 2mg , Nebulizer 2-3x daily. Singulair,  -----f/u PFT, COPD, SOB  Arrival sat 95% room air walking.            Daughter here On prednisone x many years. DOE on room air in hallway. Cough routinely productive clear sputum.  PFT 02/26/2019- Mild obstruction, Diffusion moderately reduced, no resp to BD,  CXR- 01/25/2019- 1V Heart size is normal. The lungs are free of focal consolidations and pleural effusions. Minimal RIGHT LOWER lobe atelectasis or scarring. Remote rib fractures. Chronic changes in both shoulders. IMPRESSION: No active disease.  08/27/19- 84 yoF never smoker followed for asthma/ COPD/ Tracheobronchomalacia,Bronchiectasis, Granulomatous lung disease,  GERD, complicated by  DM2, Hypothyroid, HBP, dCHF,  O2 2L [employer],  Rescue inhaler 1-2x/ week, Trelegy 100 and maintenance prednisone 2mg (  1 mg x 2), Nebulizer .  Singulair,  Has been on maintenance prednisone for decades.  Routine morning cough clear mucus. Uses rescue inhaler 2X week, Trelegy helps. Neb occasionally.  -Not using O2 now at all, but has it. No acute respiratory events.  Incidental chronic UTI managed by Urology. -Frequent falls- rolling walker.  Anticipates shoulder repair- no set date. Discussed risk issues- anesthesia, need for mobilization, likely SNF rehab post-op.    ROS-see HPI   + = positive Constitutional:    weight loss, night sweats, fevers, chills, fatigue, lassitude. HEENT:    headaches, +difficulty swallowing, tooth/dental problems, sore throat,  sneezing, itching, ear ache, nasal congestion, post nasal drip, snoring CV:    chest pain, orthopnea, PND, swelling in lower extremities, anasarca,  dizziness, palpitations Resp:   +shortness of breath with exertion or at rest.                +productive cough,  + non-productive cough, coughing up of blood.              change in color of mucus.  +wheezing.   Skin:    rash or lesions. GI:  No-   heartburn, indigestion, abdominal pain, nausea, vomiting, diarrhea,                 change in bowel habits, loss of appetite GU: dysuria, change in color of urine, no urgency or frequency.   flank pain. MS:   +joint pain, stiffness, decreased range of motion, back pain. Neuro-     nothing unusual Psych:  change in mood or affect.  depression or anxiety.   memory loss.  OBJ- Physical Exam General- Alert, Oriented, Affect-appropriate, Distress- none acute,  + overweight Skin- rash-none, lesions- none, excoriation- none Lymphadenopathy- none Head- atraumatic            Eyes- Gross vision intact, PERRLA, conjunctivae and secretions clear            Ears- Hearing, canals-normal            Nose- Clear, no-Septal dev, mucus, polyps, erosion, perforation             Throat- Mallampati II , mucosa clear , drainage- none, tonsils- atrophic Neck- flexible , trachea midline, no stridor ,  thyroid nl, carotid no bruit Chest - symmetrical excursion , unlabored           Heart/CV- RRR , no murmur , no gallop  , no rub, nl s1 s2                           - JVD- none , edema- none, stasis changes- none, varices- none           Lung- + raspy/ coarse, wheeze-none, cough- none , dullness-none, rub- none           Chest wall-  Abd-  Br/ Gen/ Rectal- Not done, not indicated Extrem- cyanosis- none, clubbing, none, atrophy- none, strength- nl Neuro- grossly intact to observation

## 2019-08-27 NOTE — Patient Instructions (Signed)
From a pulmonary standpoint, you can go ahead with plans for necessary surgery on your shoulder..  Refill script sent for ProAir rescue inhaler  Please call if I can help

## 2019-08-29 DIAGNOSIS — F331 Major depressive disorder, recurrent, moderate: Secondary | ICD-10-CM | POA: Diagnosis not present

## 2019-08-29 DIAGNOSIS — F419 Anxiety disorder, unspecified: Secondary | ICD-10-CM | POA: Diagnosis not present

## 2019-09-03 ENCOUNTER — Ambulatory Visit (INDEPENDENT_AMBULATORY_CARE_PROVIDER_SITE_OTHER): Payer: Medicare Other | Admitting: Podiatry

## 2019-09-03 ENCOUNTER — Other Ambulatory Visit: Payer: Self-pay

## 2019-09-03 DIAGNOSIS — L97501 Non-pressure chronic ulcer of other part of unspecified foot limited to breakdown of skin: Secondary | ICD-10-CM

## 2019-09-03 DIAGNOSIS — L03031 Cellulitis of right toe: Secondary | ICD-10-CM | POA: Diagnosis not present

## 2019-09-03 DIAGNOSIS — M2041 Other hammer toe(s) (acquired), right foot: Secondary | ICD-10-CM | POA: Diagnosis not present

## 2019-09-03 DIAGNOSIS — L02611 Cutaneous abscess of right foot: Secondary | ICD-10-CM

## 2019-09-03 MED ORDER — CEPHALEXIN 500 MG PO CAPS: 500.0000 mg | ORAL_CAPSULE | Freq: Two times a day (BID) | ORAL | 0 refills | Status: DC

## 2019-09-08 IMAGING — CR DG CHEST 2V
2 series · 2 of 2 positions shown · non-contrast
Comparison: 09/19/2017

CLINICAL DATA: Cough

EXAM:
CHEST - 2 VIEW

[w chest pa]
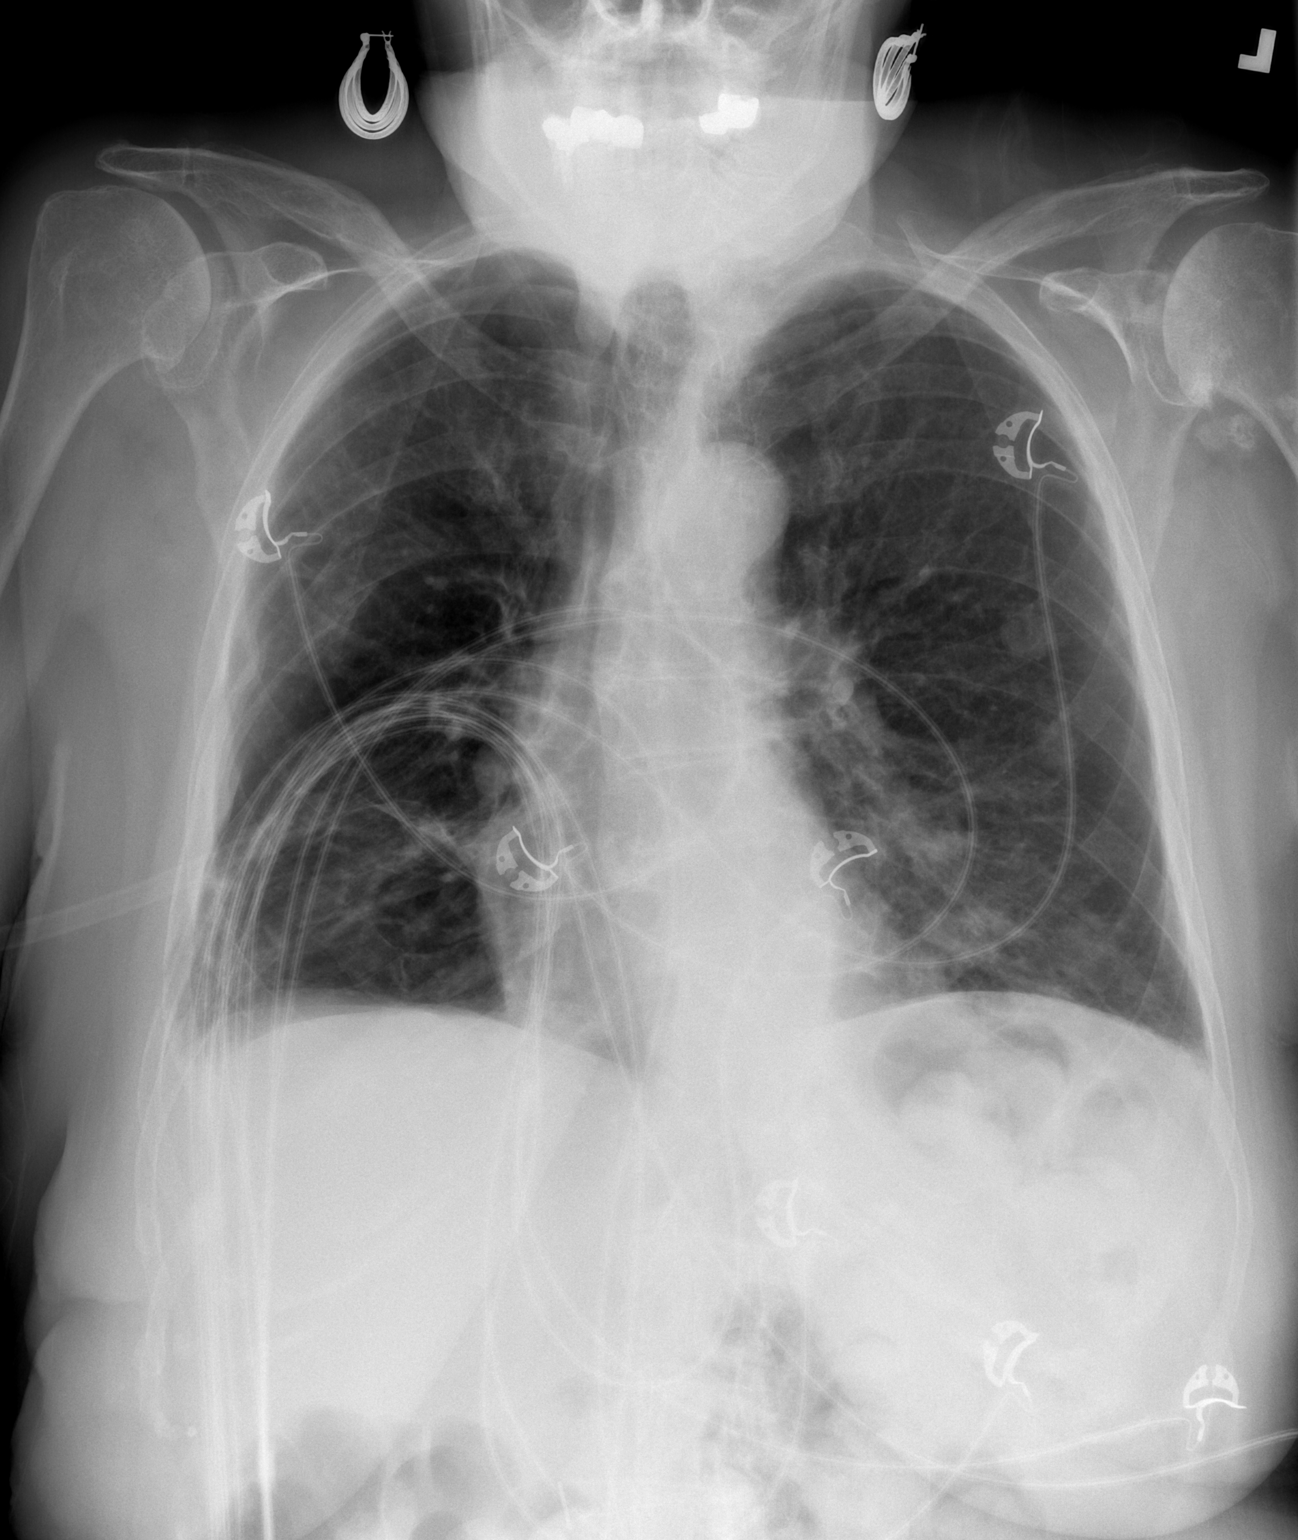

[w chest lat]
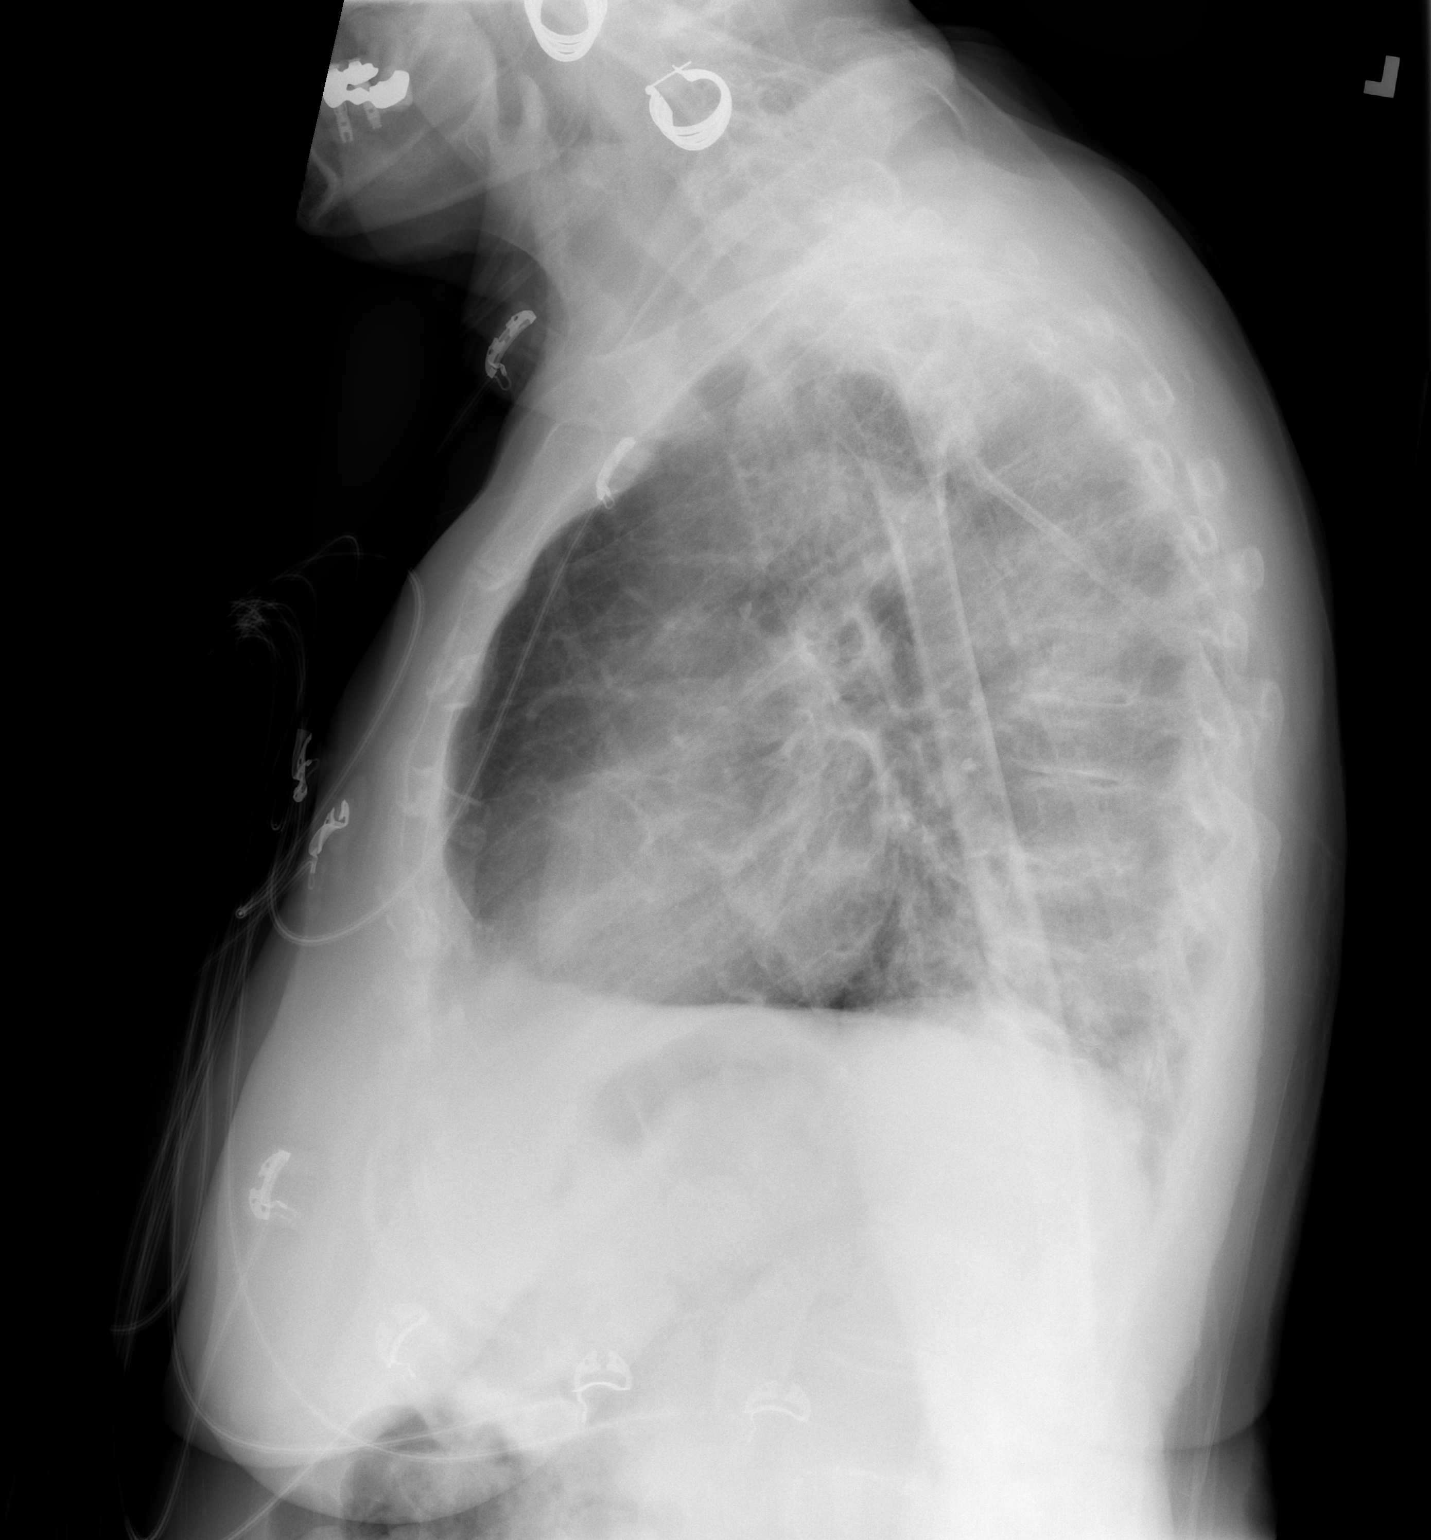

[2 of 2 positions shown; findings below may reference images not displayed]

FINDINGS: Increased left lower lobe nodular bronchovascular opacity suspicious
for pneumonia when compared to the prior study. Normal heart size
and vascularity. Right lung remains clear. No large effusion or
pneumothorax. Rib fracture deformities bilaterally. Degenerative
changes of the spine and shoulders, worse on the left. Trachea is
midline. Aorta is atherosclerotic.
IMPRESSION: Left lower lobe nodular bronchovascular opacities suspicious for
pneumonia.

## 2019-09-10 IMAGING — DX DG WRIST COMPLETE 3+V*R*
4 series · 4 of 4 positions shown · non-contrast
Comparison: None.

CLINICAL DATA: Fell yesterday with wrist pain bilaterally

EXAM:
RIGHT WRIST - COMPLETE 3+ VIEW

[wrist pa]
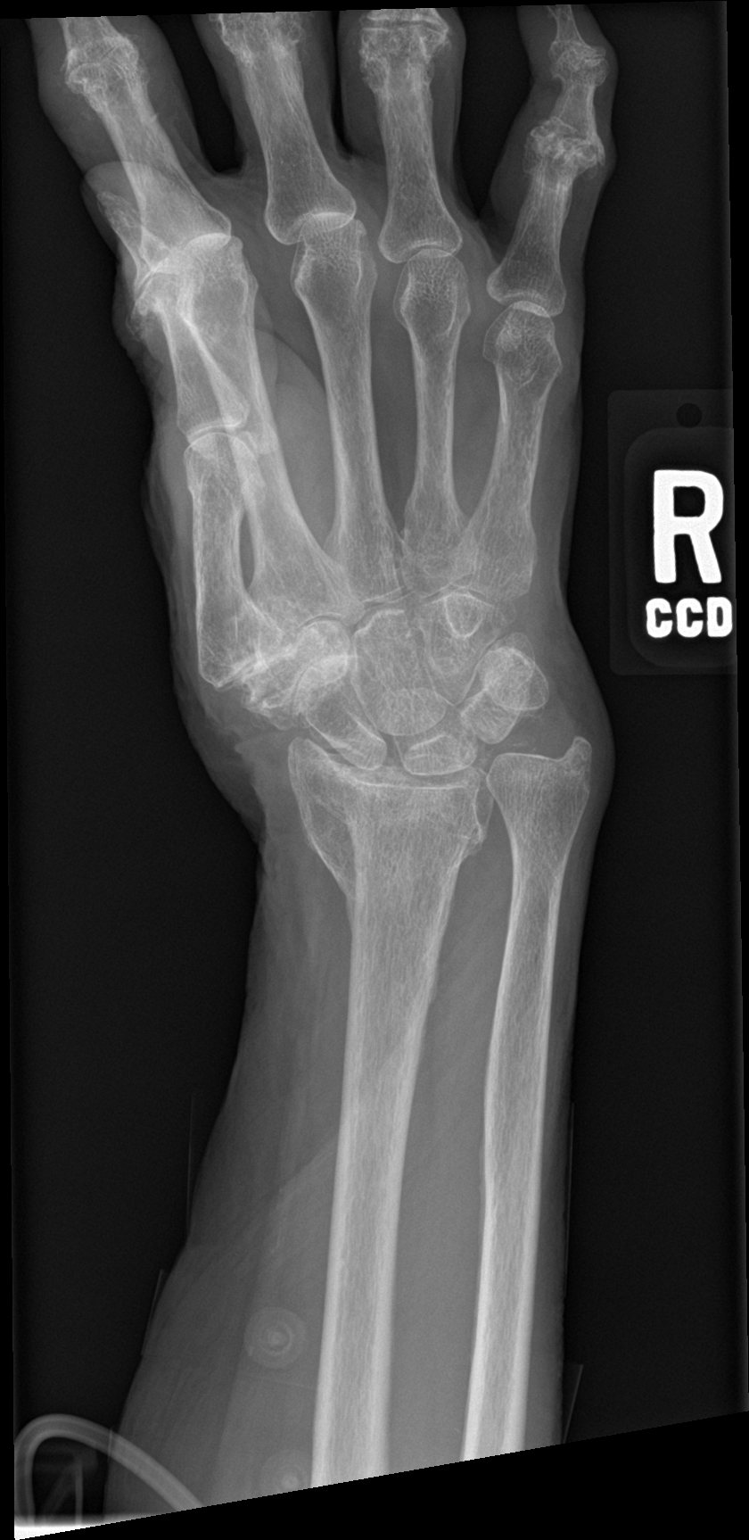

[wrist obl]
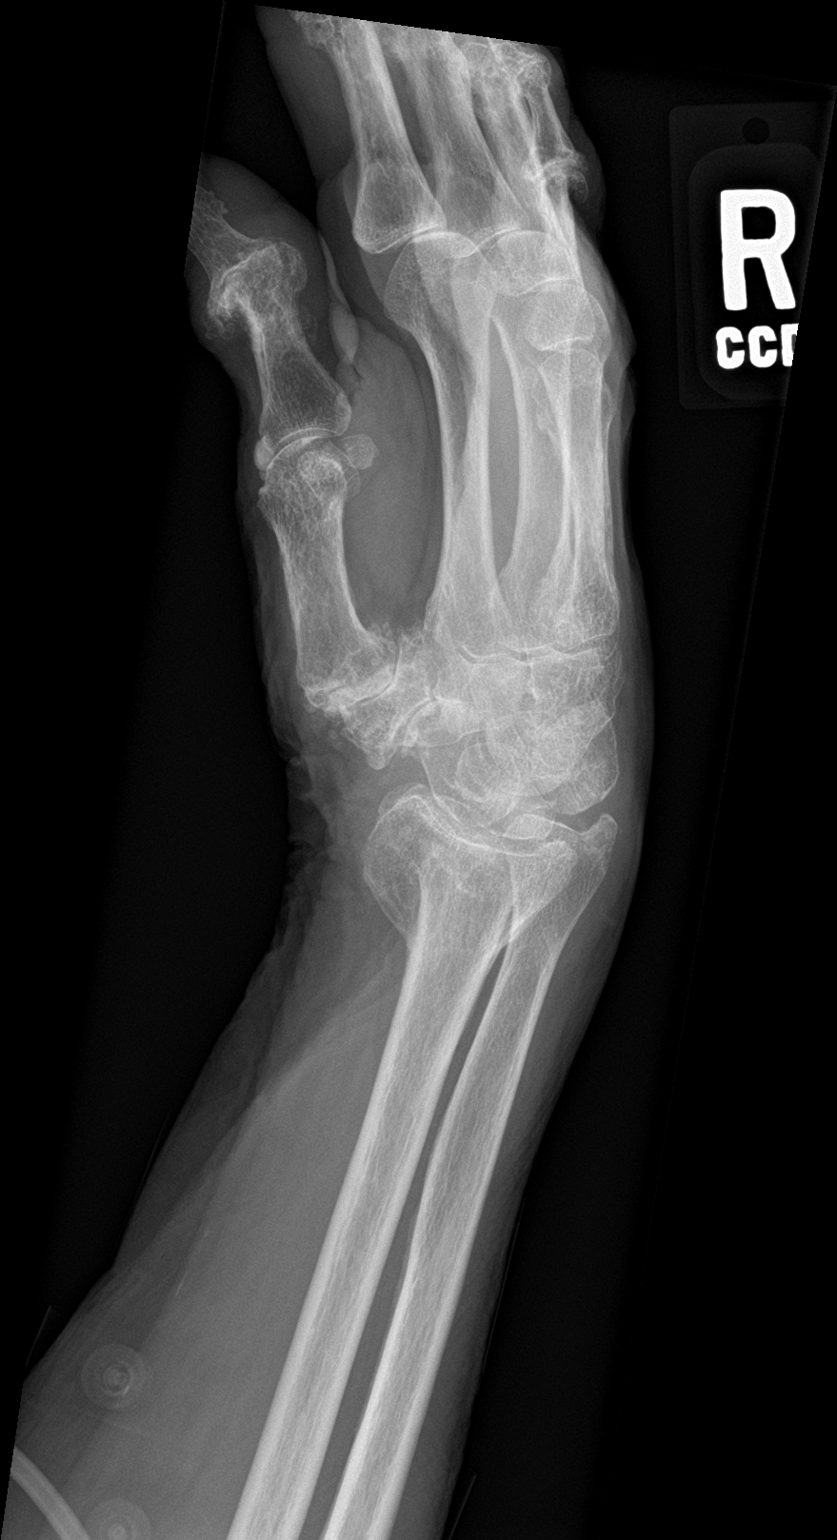

[wrist lat]
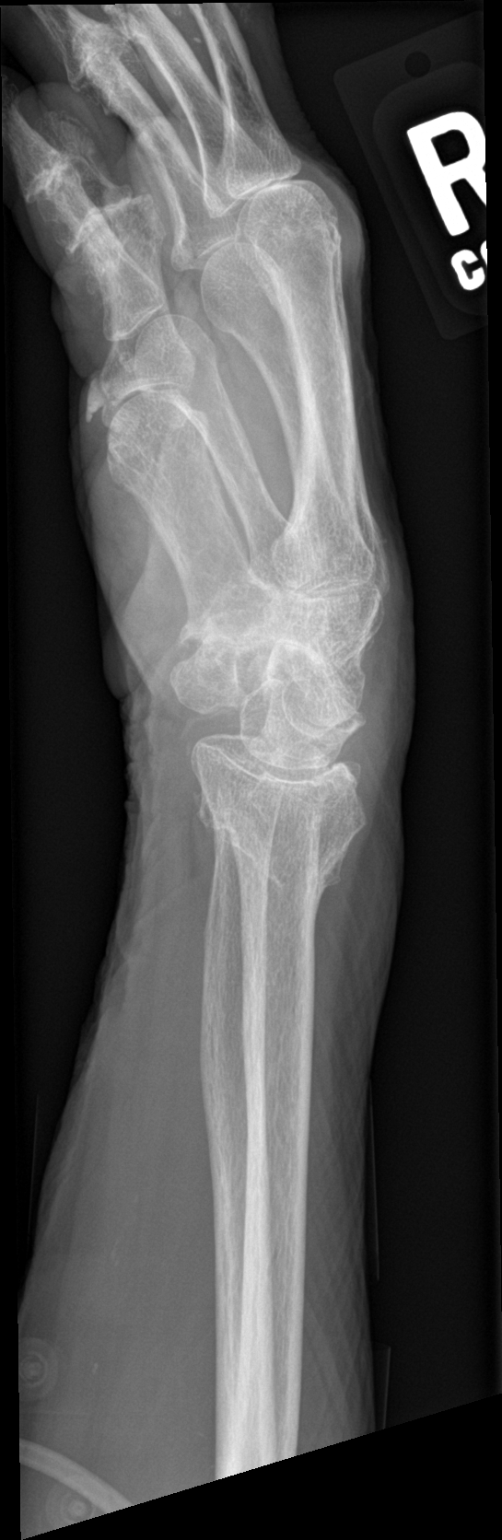

[wrist navicular]
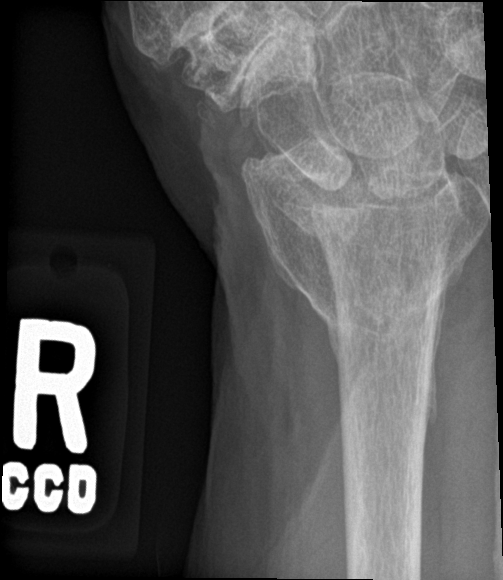

[4 of 4 positions shown; findings below may reference images not displayed]

FINDINGS: There is and old healed fracture of the distal right radius present.
Significant degenerative changes present along the radial aspect of
the wrist at the articulation of the navicula with the trapezium and
the trapezium with the first metacarpal with loss of joint space and
sclerosis. No acute fracture is seen. Chondrocalcinosis of the
triangular fibrocartilage is present which may indicate CPPD
arthropathy.
IMPRESSION: 1. No acute fracture.
2. Old deformity of the distal right radius from prior fracture.
3. Degenerative change along the radial aspect of the wrist.
4. Chondrocalcinosis consistent with CPPD arthropathy.

## 2019-09-10 IMAGING — DX DG KNEE 3 VIEWS*L*
3 series · 3 of 3 positions shown · non-contrast
Comparison: MR left knee of 09/12/2016

CLINICAL DATA: Fell yesterday with hip, wrist, and knee pain

EXAM:
LEFT KNEE - 3 VIEW

[knee ap]
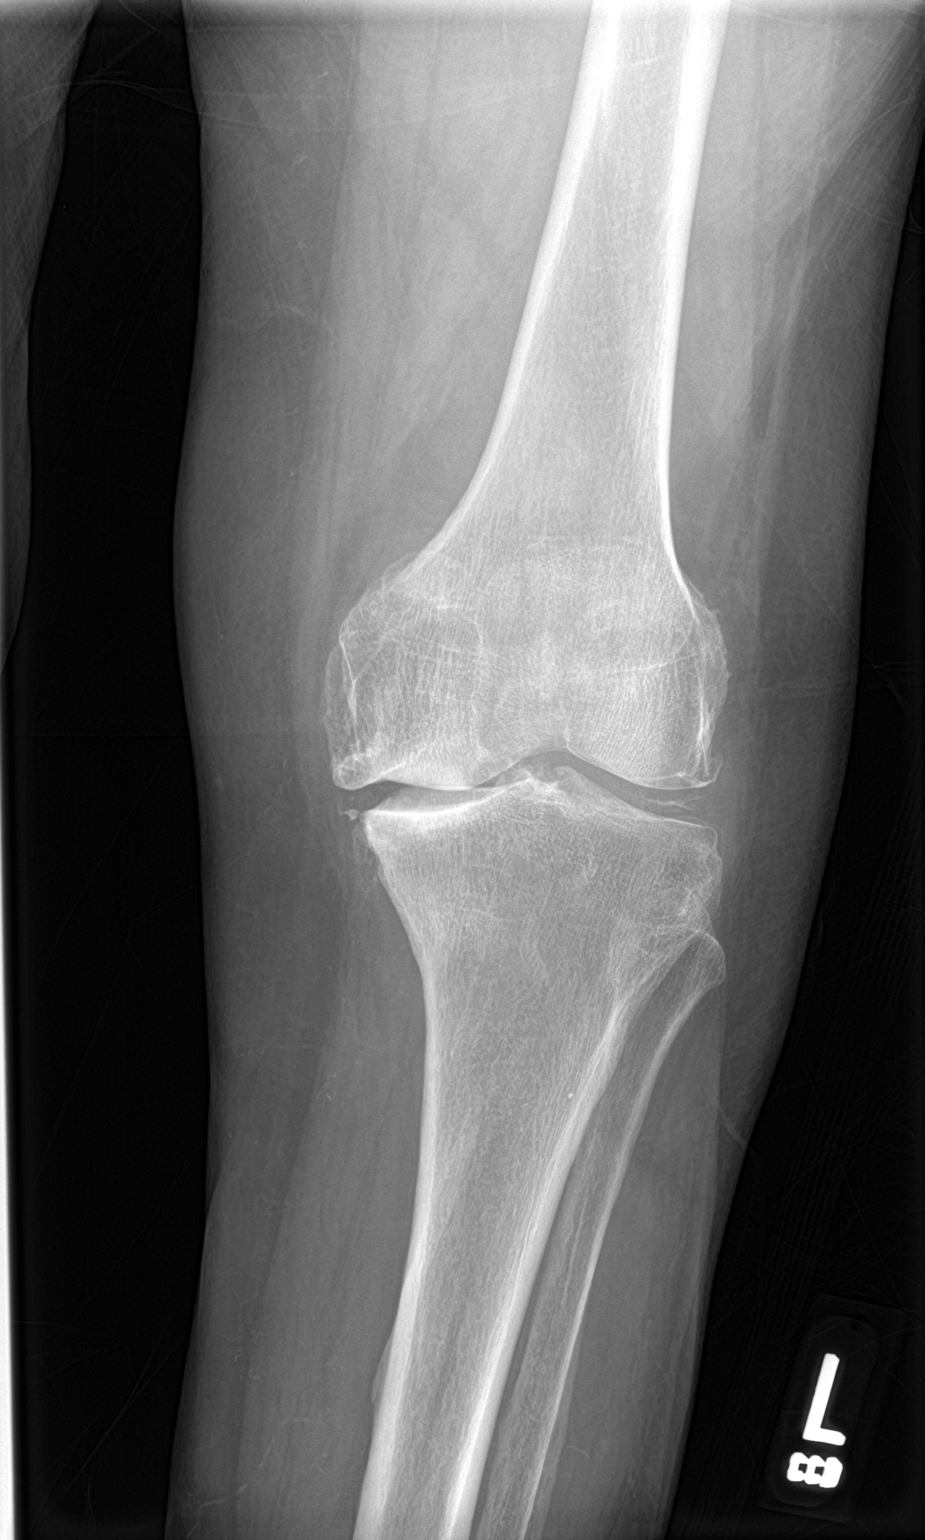

[knee lat]
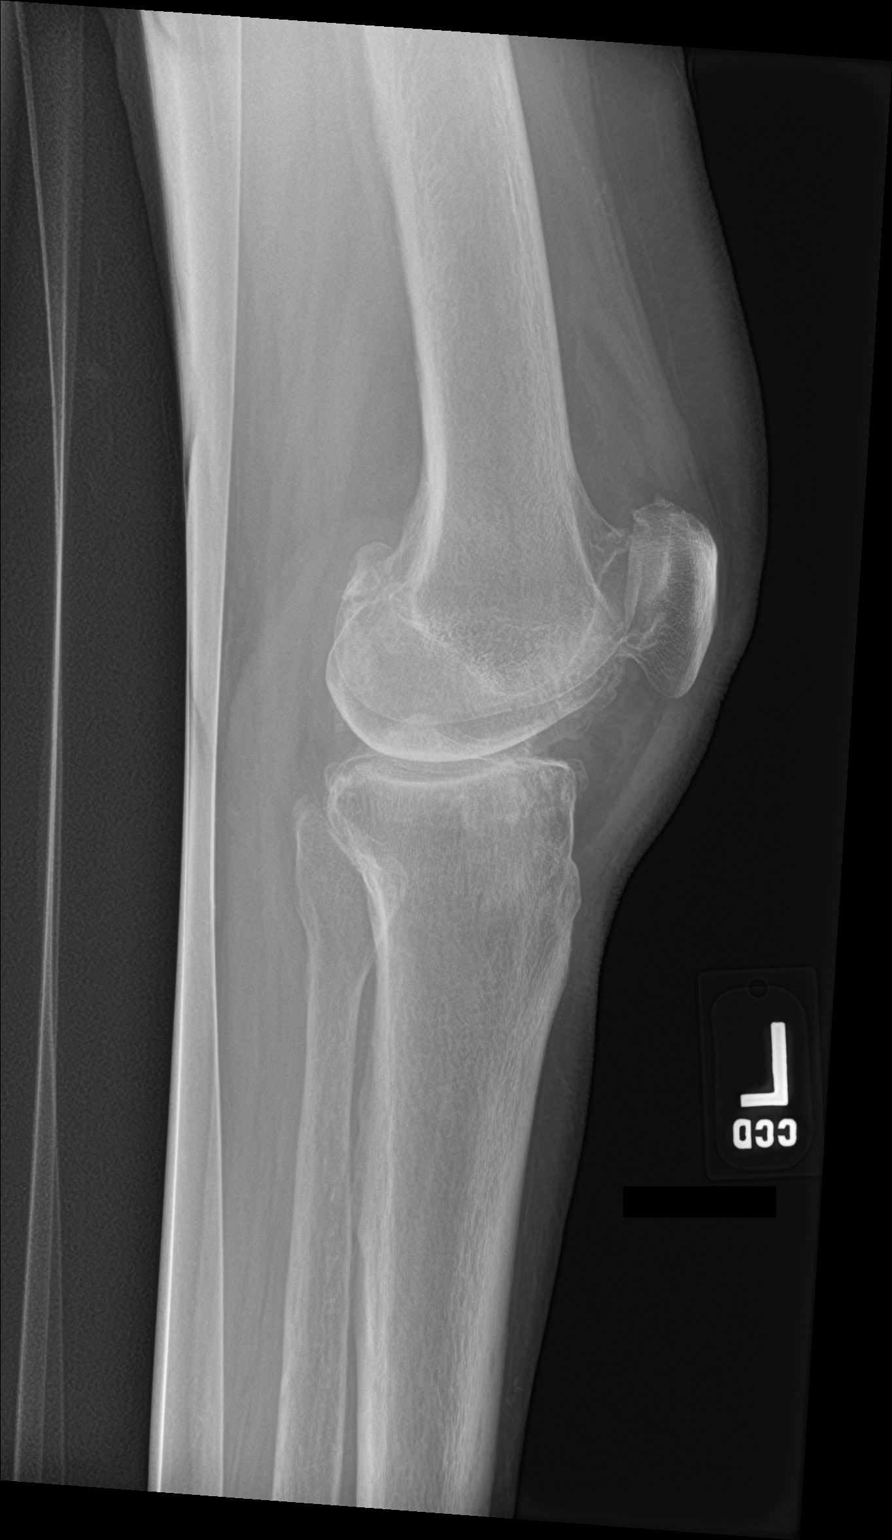

[knee obl]
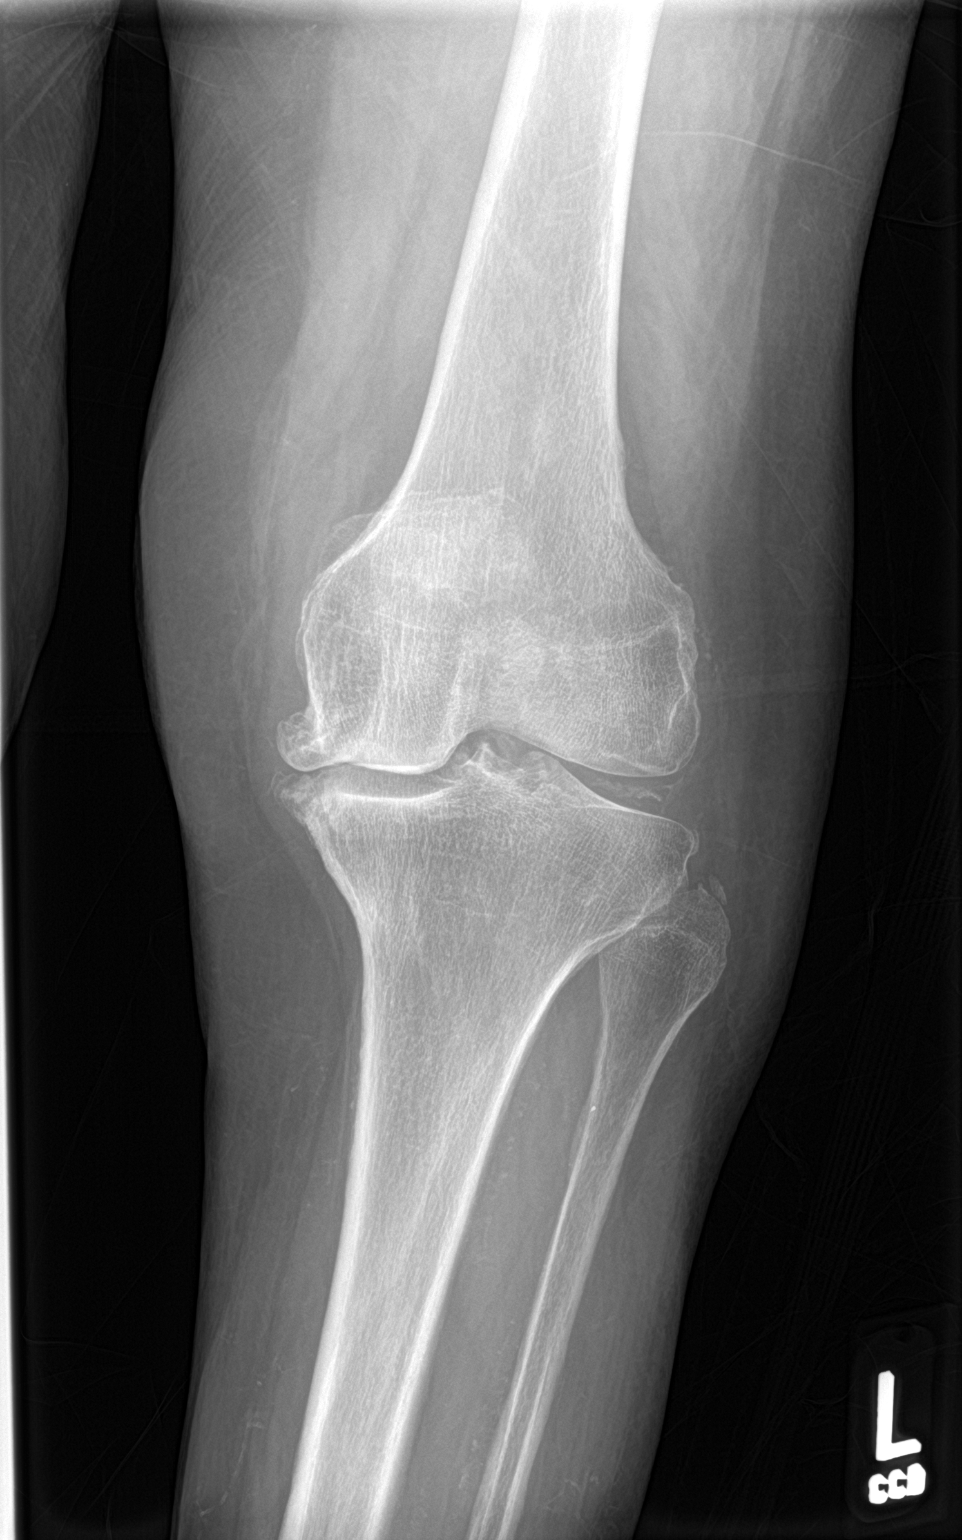

[3 of 3 positions shown; findings below may reference images not displayed]

FINDINGS: There is tricompartmental degenerative joint disease of the left
knee primarily involving the medial compartment, where there is more
loss of joint space, sclerosis, and spurring present. There is
spurring from both the patellofemoral and lateral compartments as
well with better preservation of joint spaces. Linear calcification
in the knee is consistent with chondrocalcinosis indicative of CPPD
arthropathy. No joint effusion is seen and no fracture is noted.
IMPRESSION: 1. Tricompartmental degenerative joint disease of the left knee
primarily involving the medial compartment.
2. No fracture or effusion.
3. Calcification consistent with CPPD arthropathy.

## 2019-09-11 NOTE — Progress Notes (Signed)
Subjective: 84 year old female presents to the office today for evaluation of right second toe ulceration due to hammertoe contracture.  She states that there is been some redness and swelling to the toe.  Denies any drainage or pus.  Area is tender at times.  No swelling or redness of the foot.  Seems to be localized to the middle the toe pointing to the PIPJ.  Denies any fevers, chills, nausea, vomiting.  No calf pain, chest pain, shortness of breath.  Objective: AAO x3, NAD DP/PT pulses palpable bilaterally, CRT less than 3 seconds Hammertoe contracture present of the right second toe resulting in a hyperkeratotic lesion on the dorsal PIPJ.  Upon debridement almost pinpoint lesion identified there is mild increase in edema and erythema localized to the dorsal PIPJ.  There is no ascending cellulitis.  Is no fluctuation crepitation but there is no malodor.  There is no probing to bone, undermining or tunneling.  No pain with calf compression, swelling, warmth, erythema  Assessment: Right second toe ulceration due to hammertoe contracture with localized erythema and edema  Plan: -All treatment options discussed with the patient including all alternatives, risks, complications.  -Debrided the hyperkeratotic tissue to reveal the small superficial pinpoint wound.  Continue with daily dressing changes.  We will switch to  medihoney dressing changes daily.  Will prescribe Keflex.  She has done this previously done well.  Doxycycline she states was not helpful previously.  Continue with the Iodosorb dressing changes daily.  Continue offloading.  Recommend to hold off on pedicures for now until the wound completely heals.  Return in about 3 weeks (around 08/30/2019).  Trula Slade DPM

## 2019-09-20 ENCOUNTER — Ambulatory Visit (INDEPENDENT_AMBULATORY_CARE_PROVIDER_SITE_OTHER): Payer: Medicare Other | Admitting: Podiatry

## 2019-09-20 ENCOUNTER — Other Ambulatory Visit: Payer: Self-pay

## 2019-09-20 ENCOUNTER — Encounter: Payer: Self-pay | Admitting: Podiatry

## 2019-09-20 DIAGNOSIS — B351 Tinea unguium: Secondary | ICD-10-CM

## 2019-09-20 DIAGNOSIS — L97501 Non-pressure chronic ulcer of other part of unspecified foot limited to breakdown of skin: Secondary | ICD-10-CM

## 2019-09-20 DIAGNOSIS — M2041 Other hammer toe(s) (acquired), right foot: Secondary | ICD-10-CM

## 2019-09-30 NOTE — Progress Notes (Signed)
Subjective: 84 year old female presents to the office today for evaluation of right second toe ulceration due to hammertoe contracture as well as for elongated toenails that she cannot trim himself.  Denies any swelling or redness or drainage to the toenail sites.  She thinks that the wound on the second toe is healing although very slowly and not sure how it looks.  She finished the course of antibiotics. Denies any fevers, chills, nausea, vomiting.  No calf pain, chest pain, shortness of breath.  Objective: AAO x3, NAD-presents with daughter DP/PT pulses palpable bilaterally, CRT less than 3 seconds Hammertoe contracture present of the right second toe resulting in a hyperkeratotic lesion on the dorsal PIPJ.  After debridement of hyperkeratotic lesion today there is no definitive skin breakdown there is no drainage or pus identified.  There is rigid hammertoe contracture and there is some chronic edema and erythema to the second toe but when you compare the toe to the other digits they all have the same color changes.  There is no warmth of the toe and there is no ascending cellulitis.  There is no areas of fluctuation crepitation.  Chronic bilateral lower extremity edema is present with mild pitting. Nails are hypertrophic, dystrophic, brittle, discolored, elongated 10. No surrounding redness or drainage. Tenderness nails 1-5 bilaterally. No open lesions or pre-ulcerative lesions are identified today. No pain with calf compression, swelling, warmth, erythema  Assessment: Right second toe ulceration due to hammertoe contracture-improving; symptomatic onychomycosis; pitting edema  Plan: -All treatment options discussed with the patient including all alternatives, risks, complications.  -Debrided hyperkeratotic tissue to the area and complications.  The wound appears to be healed well and her to continue with a small amount of antibiotic ointment daily.  Monitoring signs or symptoms of recurrence of  infection/ulceration.  Unfortunate she is a high risk of recurrence we discussed shoe modifications to avoid pressure. -Debrided nails x10 without any complications or bleeding -Encourage elevation follow-up with her primary care physician regarding continued edema.  Return in about 3 weeks (around 10/11/2019).  Trula Slade DPM

## 2019-10-01 ENCOUNTER — Telehealth: Payer: Self-pay | Admitting: Internal Medicine

## 2019-10-01 DIAGNOSIS — Z961 Presence of intraocular lens: Secondary | ICD-10-CM | POA: Diagnosis not present

## 2019-10-01 DIAGNOSIS — H04123 Dry eye syndrome of bilateral lacrimal glands: Secondary | ICD-10-CM | POA: Diagnosis not present

## 2019-10-01 DIAGNOSIS — Z794 Long term (current) use of insulin: Secondary | ICD-10-CM | POA: Diagnosis not present

## 2019-10-01 DIAGNOSIS — E119 Type 2 diabetes mellitus without complications: Secondary | ICD-10-CM | POA: Diagnosis not present

## 2019-10-01 LAB — HM DIABETES EYE EXAM

## 2019-10-01 NOTE — Telephone Encounter (Signed)
    Patient requesting her last lab results be sent to  [facility] Browns Phone [phone] Fax [phone]

## 2019-10-01 NOTE — Telephone Encounter (Signed)
Spoke to pt and informed her no recent labs from earlier this year of 2021 or the year of 2020 are in her chart from PCP. She would have to follow up with the other physicians listed for then to send lab work. I provided the physician names to patient for her to follow up with those offices.

## 2019-10-02 NOTE — Assessment & Plan Note (Signed)
Chronic microaspiration may have caused or contributed to her bronchiectasis. Plan- emphasis continued attention to reflux precautions.

## 2019-10-02 NOTE — Assessment & Plan Note (Signed)
Not in overt CHF at this visit.

## 2019-10-02 NOTE — Assessment & Plan Note (Signed)
Mild persistent chronic asthma/ bronchitis/ bronchiectasis overlap syndrome without acute exacerbation.  Plan- stable, but increase risk of surgical complications, due to age and chronic bronchitis, chronic CHF for anticipated surgery for shoulder. She will continue current meds. Will watch need to reassess overnight oximetry.

## 2019-10-04 DIAGNOSIS — N3000 Acute cystitis without hematuria: Secondary | ICD-10-CM | POA: Diagnosis not present

## 2019-10-08 ENCOUNTER — Ambulatory Visit (INDEPENDENT_AMBULATORY_CARE_PROVIDER_SITE_OTHER): Payer: Medicare Other | Admitting: *Deleted

## 2019-10-08 ENCOUNTER — Telehealth: Payer: Self-pay

## 2019-10-08 ENCOUNTER — Telehealth: Payer: Self-pay | Admitting: Internal Medicine

## 2019-10-08 ENCOUNTER — Other Ambulatory Visit: Payer: Self-pay

## 2019-10-08 DIAGNOSIS — M810 Age-related osteoporosis without current pathological fracture: Secondary | ICD-10-CM

## 2019-10-08 MED ORDER — DENOSUMAB 60 MG/ML ~~LOC~~ SOSY
60.0000 mg | PREFILLED_SYRINGE | Freq: Once | SUBCUTANEOUS | Status: AC
  Administered 2019-10-08: 60 mg via SUBCUTANEOUS

## 2019-10-08 NOTE — Progress Notes (Signed)
Pls cosign for Prolia inj in absence of PCP.../lmb  

## 2019-10-08 NOTE — Telephone Encounter (Signed)
Kayla Thompson with [facility] calling and states that they faxed over a surgical clearance on 09/28/2019. No documentation in the chart. Informed her to refax to [phone] (temporary fax).

## 2019-10-08 NOTE — Telephone Encounter (Signed)
Left message for surgical coordinator at [facility]. Per 08/27/19 OV with CY pt was cleared for shoulder sx- we need to know if this is sufficient or if a particular form needs to be filled out.

## 2019-10-09 ENCOUNTER — Encounter: Payer: Self-pay | Admitting: Podiatry

## 2019-10-09 ENCOUNTER — Ambulatory Visit (INDEPENDENT_AMBULATORY_CARE_PROVIDER_SITE_OTHER): Payer: Medicare Other | Admitting: Podiatry

## 2019-10-09 DIAGNOSIS — L84 Corns and callosities: Secondary | ICD-10-CM | POA: Diagnosis not present

## 2019-10-09 DIAGNOSIS — E1149 Type 2 diabetes mellitus with other diabetic neurological complication: Secondary | ICD-10-CM

## 2019-10-09 NOTE — Telephone Encounter (Signed)
I have the form at my desk. Pt has an appt on 7/27 for evaluation with Sharlet Salina before it can be completed and faxed back.

## 2019-10-10 NOTE — Telephone Encounter (Signed)
LMTCB x2  

## 2019-10-10 NOTE — Progress Notes (Signed)
Subjective: 84 year old female presents to the office today for follow-up evaluation of right second toe pre-ulcerative calls/ulcer. The toe is still touch when it is touched. No increase in redness and there is no drainage or pus identified.  She is on the surgical shoe.  They are asked about ordering shoes. Denies any fevers, chills, nausea, vomiting.  No calf pain, chest pain, shortness of breath.  Objective: AAO x3, NAD-presents with daughter DP/PT pulses palpable bilaterally, CRT less than 3 seconds Hammertoe contracture present of the right second toe resulting in a hyperkeratotic lesion on the dorsal PIPJ.  Upon debridement there is no underlying ulceration identified there is no drainage or pus.  Hammertoe contractures rigid there is some mild chronic edema erythema marked this is more from inflammation as opposed to infection.  There is no warmth and there is no ascending cellulitis or any other signs of infection noted. No pain with calf compression, swelling, warmth, erythema  Assessment: Right second toe preulcerative callus  Plan: -All treatment options discussed with the patient including all alternatives, risks, complications.  -Debrided hyperkeratotic tissue with any complications or bleeding.  Continue offloading surgical shoe for now.  She did try to order extra-depth shoes.  This is enough for her.  I try to get her diabetic shoes in the next appointment on her follow-up with Ivin Booty DPM

## 2019-10-11 NOTE — Telephone Encounter (Signed)
Called and spoke with Bethena Roys who is surgical coordinator with [facility]. Stated to her that at pt's last OV, CY cleared her for surgery. Asked Bethena Roys if this was sufficient enough and she stated that there was a form that was needing to be filled out by [facility] and faxed back to her. Bethena Roys is going to refax the form for [place] to have CY fill out. Will keep encounter open and keep an eye out for the form.

## 2019-10-12 ENCOUNTER — Other Ambulatory Visit: Payer: Self-pay

## 2019-10-12 ENCOUNTER — Encounter (HOSPITAL_BASED_OUTPATIENT_CLINIC_OR_DEPARTMENT_OTHER): Payer: Self-pay

## 2019-10-12 ENCOUNTER — Emergency Department (HOSPITAL_BASED_OUTPATIENT_CLINIC_OR_DEPARTMENT_OTHER): Payer: Medicare Other

## 2019-10-12 ENCOUNTER — Emergency Department (HOSPITAL_BASED_OUTPATIENT_CLINIC_OR_DEPARTMENT_OTHER)
Admission: EM | Admit: 2019-10-12 | Discharge: 2019-10-12 | Disposition: A | Discharge status: Home / Self Care | Payer: Medicare Other | Attending: Emergency Medicine | Admitting: Emergency Medicine

## 2019-10-12 DIAGNOSIS — I700 Atherosclerosis of aorta: Secondary | ICD-10-CM | POA: Diagnosis not present

## 2019-10-12 DIAGNOSIS — Z79899 Other long term (current) drug therapy: Secondary | ICD-10-CM | POA: Diagnosis not present

## 2019-10-12 DIAGNOSIS — Z8543 Personal history of malignant neoplasm of ovary: Secondary | ICD-10-CM | POA: Diagnosis not present

## 2019-10-12 DIAGNOSIS — Z7951 Long term (current) use of inhaled steroids: Secondary | ICD-10-CM | POA: Diagnosis not present

## 2019-10-12 DIAGNOSIS — Z91040 Latex allergy status: Secondary | ICD-10-CM | POA: Insufficient documentation

## 2019-10-12 DIAGNOSIS — E039 Hypothyroidism, unspecified: Secondary | ICD-10-CM | POA: Diagnosis not present

## 2019-10-12 DIAGNOSIS — J449 Chronic obstructive pulmonary disease, unspecified: Secondary | ICD-10-CM | POA: Diagnosis not present

## 2019-10-12 DIAGNOSIS — M7122 Synovial cyst of popliteal space [Baker], left knee: Secondary | ICD-10-CM | POA: Diagnosis not present

## 2019-10-12 DIAGNOSIS — M47816 Spondylosis without myelopathy or radiculopathy, lumbar region: Secondary | ICD-10-CM | POA: Diagnosis not present

## 2019-10-12 DIAGNOSIS — I110 Hypertensive heart disease with heart failure: Secondary | ICD-10-CM | POA: Insufficient documentation

## 2019-10-12 DIAGNOSIS — M79605 Pain in left leg: Secondary | ICD-10-CM | POA: Diagnosis not present

## 2019-10-12 DIAGNOSIS — M5136 Other intervertebral disc degeneration, lumbar region: Secondary | ICD-10-CM | POA: Diagnosis not present

## 2019-10-12 DIAGNOSIS — E1165 Type 2 diabetes mellitus with hyperglycemia: Secondary | ICD-10-CM | POA: Diagnosis not present

## 2019-10-12 DIAGNOSIS — M5416 Radiculopathy, lumbar region: Secondary | ICD-10-CM | POA: Diagnosis not present

## 2019-10-12 DIAGNOSIS — Z79890 Hormone replacement therapy (postmenopausal): Secondary | ICD-10-CM | POA: Diagnosis not present

## 2019-10-12 DIAGNOSIS — I5032 Chronic diastolic (congestive) heart failure: Secondary | ICD-10-CM | POA: Diagnosis not present

## 2019-10-12 DIAGNOSIS — M25552 Pain in left hip: Secondary | ICD-10-CM | POA: Diagnosis not present

## 2019-10-12 DIAGNOSIS — J45909 Unspecified asthma, uncomplicated: Secondary | ICD-10-CM | POA: Insufficient documentation

## 2019-10-12 DIAGNOSIS — I708 Atherosclerosis of other arteries: Secondary | ICD-10-CM | POA: Diagnosis not present

## 2019-10-12 DIAGNOSIS — M4316 Spondylolisthesis, lumbar region: Secondary | ICD-10-CM | POA: Diagnosis not present

## 2019-10-12 LAB — URINALYSIS, ROUTINE W REFLEX MICROSCOPIC
Bilirubin Urine: NEGATIVE
Glucose, UA: NEGATIVE mg/dL
Hgb urine dipstick: NEGATIVE
Ketones, ur: NEGATIVE mg/dL
Leukocytes,Ua: NEGATIVE
Nitrite: NEGATIVE
Protein, ur: NEGATIVE mg/dL
Specific Gravity, Urine: 1.01 (ref 1.005–1.030)
pH: 6.5 (ref 5.0–8.0)

## 2019-10-12 MED ORDER — LIDOCAINE 5 % EX PTCH
1.0000 | MEDICATED_PATCH | CUTANEOUS | Status: DC
  Administered 2019-10-12: 1 via TRANSDERMAL
  Filled 2019-10-12: qty 1

## 2019-10-12 MED ORDER — OXYCODONE HCL 5 MG PO TABS: 5.0000 mg | ORAL_TABLET | ORAL | 0 refills | Status: DC | PRN

## 2019-10-12 MED ORDER — OXYCODONE HCL 5 MG PO TABS
5.0000 mg | ORAL_TABLET | Freq: Once | ORAL | Status: AC
  Administered 2019-10-12: 5 mg via ORAL
  Filled 2019-10-12: qty 1

## 2019-10-12 NOTE — Telephone Encounter (Signed)
Form was completed and signed by Dr. Annamaria Boots. Faxed back to Judy's attention.

## 2019-10-12 NOTE — ED Notes (Signed)
ED Provider at bedside. 

## 2019-10-12 NOTE — Telephone Encounter (Signed)
Surgical clearance form arrived and will be completed and faxed today. Nothing further needed.

## 2019-10-12 NOTE — ED Provider Notes (Signed)
[facility] EMERGENCY DEPARTMENT Provider Note   CSN: [id number] Arrival date & time: 10/12/19  1534     History Chief Complaint  Patient presents with  . Leg Pain    Kayla Thompson is a 84 y.o. female.  84 year old female presents with complaint of pain in her left thigh.  Patient states pain started this morning when she stood up to get out of bed, located left thigh/left hip and left groin areas.  Worse with standing and bearing weight.  No history of falls or injuries recently, denies swelling or redness.  Denies abdominal pain, changes in bowel or bladder habits, groin numbness.  No other complaints or concerns today.  Pain proved slightly with taking Percocet and extra strength Tylenol at home.        Past Medical History:  Diagnosis Date  . Anxiety   . Arthritis   . Asthma   . Cataracts, bilateral    removed by surgery  . COPD (chronic obstructive pulmonary disease) ([place])   . Depression   . Diabetes mellitus without complication ([place])    type 2  . Dyspnea    with exertion  . Fibromyalgia   . GERD (gastroesophageal reflux disease)   . History of chemotherapy   . History of fractured pelvis   . Hypertension   . Hypothyroidism   . Osteoporosis 12/2017   T score -2.8 stable from prior DEXA  . Osteoporosis   . Ovarian cancer ([place])   . Pneumonia    several times    Patient Active Problem List   Diagnosis Date Noted  . UTI symptoms 05/29/2019  . Dysuria 05/11/2019  . Upper esophageal web 03/27/2019  . Iron deficiency anemia 03/27/2019  . Candida esophagitis ([place]) 02/14/2019  . Encounter for general adult medical examination with abnormal findings 01/09/2019  . Abnormal barium swallow 12/28/2018  . Throat clearing 12/28/2018  . History of colonic polyps 12/28/2018  . Dysphagia 12/28/2018  . Diabetic foot ulcer ([place]) 12/05/2018  . Rotator cuff arthropathy, left 11/16/2018  . Degenerative arthritis of left knee 11/16/2018  . Insomnia 09/26/2018   . Leukocytosis 10/17/2017  . Malnutrition of moderate degree 10/10/2017  . Memory changes 07/29/2017  . Granulomatous lung disease ([place]) 06/14/2017  . Tracheobronchomalacia 06/14/2017  . Dizziness 03/04/2017  . Exercise hypoxemia 07/12/2016  . Diastolic CHF, chronic ([place]) 12/25/2015  . GERD (gastroesophageal reflux disease) 12/25/2015  . Epidermal inclusion cyst 09/02/2015  . Type 2 diabetes mellitus with hyperglycemia, with long-term current use of insulin ([place]) 03/10/2015  . Muscle cramps 03/06/2015  . Bronchiectasis without acute exacerbation ([place]) 01/07/2015  . PMR (polymyalgia rheumatica) ([place]) 01/07/2015  . Osteoarthritis 01/07/2015  . Asthma, chronic 11/12/2014  . Anxiety state 11/12/2014  . Hyperparathyroidism ([place]) 10/28/2014  . Fibromyalgia 09/21/2014  . Personal history of ovarian cancer 08/23/2014  . Osteoporosis 08/23/2014  . Hypertension 08/02/2014  . HLD (hyperlipidemia) 08/02/2014  . Hypothyroidism 08/02/2014    Past Surgical History:  Procedure Laterality Date  . ABDOMINAL HYSTERECTOMY  1996  . ANKLE FRACTURE SURGERY     plate and screws  . BIOPSY  02/05/2019   Procedure: BIOPSY;  Surgeon: Rush Landmark Telford Nab., MD;  Location: [facility];  Service: Gastroenterology;;  . BLADDER SUSPENSION    . CATARACT EXTRACTION    . COLONOSCOPY    . ESOPHAGEAL BRUSHING  02/05/2019   Procedure: ESOPHAGEAL BRUSHING;  Surgeon: Rush Landmark Telford Nab., MD;  Location: [facility] ENDOSCOPY;  Service: Gastroenterology;;  . ESOPHAGOGASTRODUODENOSCOPY (EGD) WITH PROPOFOL N/A  02/05/2019   Procedure: ESOPHAGOGASTRODUODENOSCOPY (EGD) WITH PROPOFOL with dialtion;  Surgeon: Rush Landmark Telford Nab., MD;  Location: [facility];  Service: Gastroenterology;  Laterality: N/A;  . EYE SURGERY     cataracts removed-bilateral  . hip surgey    . knee surgey    . LUNG SURGERY    . OOPHORECTOMY     BSO  . SAVORY DILATION N/A 02/05/2019   Procedure: SAVORY DILATION;  Surgeon: Rush Landmark  Telford Nab., MD;  Location: [facility];  Service: Gastroenterology;  Laterality: N/A;  . TONSILLECTOMY    . UPPER GI ENDOSCOPY       OB History    Gravida  1   Para  1   Term      Preterm      AB      Living  1     SAB      TAB      Ectopic      Multiple      Live Births              Family History  Problem Relation Age of Onset  . Lung cancer Mother   . CAD Father   . Diabetes Father   . Lung cancer Maternal Aunt   . Diabetes Paternal Aunt   . Diabetes Paternal Uncle   . Diabetes Paternal Grandmother   . Diabetes Paternal Grandfather   . Colon cancer Neg Hx   . Esophageal cancer Neg Hx   . Inflammatory bowel disease Neg Hx   . Liver disease Neg Hx   . Pancreatic cancer Neg Hx   . Rectal cancer Neg Hx   . Stomach cancer Neg Hx     Social History   Tobacco Use  . Smoking status: Never Smoker  . Smokeless tobacco: Never Used  Vaping Use  . Vaping Use: Never used  Substance Use Topics  . Alcohol use: No    Alcohol/week: 0.0 standard drinks  . Drug use: No    Home Medications Prior to Admission medications   Medication Sig Start Date End Date Taking? Authorizing Provider  albuterol (PROAIR HFA) 108 (90 Base) MCG/ACT inhaler Inhale 2 puffs into the lungs every 6 (six) hours as needed for wheezing or shortness of breath. 08/27/19   Deneise Lever, MD  amitriptyline (ELAVIL) 10 MG tablet Take 1 tablet (10 mg total) by mouth at bedtime. 02/13/19   [name], MD  baclofen (LIORESAL) 10 MG tablet TAKE 0.5-1 TABLET BY MOUTH TWICE DAILY AS NEEDED FOR MUSCLE SPASMS OR PAIN 03/19/19   [name], MD  BD PEN NEEDLE NANO U/F 32G X 4 MM MISC USE 4 TIMES A DAY 08/07/18   [name], MD  busPIRone (BUSPAR) 5 MG tablet TAKE 1 TO 2 TABLETS BY MOUTH TWICE A DAY Patient taking differently: Take 5-10 mg by mouth daily as needed (anxiety).  05/11/18   [name], MD  calcium carbonate (OSCAL) 1500 (600 Ca) MG TABS tablet  Take 1,500 mg by mouth 2 (two) times daily with a meal.     [provider]  carvedilol (COREG) 3.125 MG tablet Take 1 tablet (3.125 mg total) by mouth 2 (two) times daily with a meal. 07/02/19   [name], MD  carvedilol (COREG) 6.25 MG tablet Take 0.5 tablets (3.12 mg total) by mouth 2 (two) times daily with a meal. Annual appt is due w/albs must see provider for future refills 07/20/19   [name],  MD  cephALEXin (KEFLEX) 500 MG capsule Take 1 capsule (500 mg total) by mouth 2 (two) times daily. 05/28/19   [name], MD  cephALEXin (KEFLEX) 500 MG capsule Take 1 capsule (500 mg total) by mouth 2 (two) times daily. 09/03/19   Trula Slade, DPM  Cholecalciferol (VITAMIN D3) 2000 units TABS Take 2 tablets by mouth daily.     [provider]  Dextromethorphan-Guaifenesin (MUCINEX DM MAXIMUM STRENGTH) 60-1200 MG TB12 Take 1 tablet by mouth 2 (two) times daily. Reported on 06/23/2015    [provider]  doxycycline (VIBRA-TABS) 100 MG tablet Take 1 tablet (100 mg total) by mouth 2 (two) times daily. 06/26/19   Trula Slade, DPM  DULoxetine (CYMBALTA) 30 MG capsule Take 1 capsule (30 mg total) by mouth daily. Annual appt due in Oct must see provider for future refills 07/31/19   [name], MD  DULoxetine (CYMBALTA) 60 MG capsule Take 1 capsule (60 mg total) by mouth daily. 01/30/19   [name], MD  ferrous gluconate (FERGON) 324 MG tablet TAKE 1 TABLET BY MOUTH EVERY DAY WITH BREAKFAST 06/27/19   Mansouraty, Telford Nab., MD  Fluticasone-Umeclidin-Vilant (TRELEGY ELLIPTA) 100-62.5-25 MCG/INH AEPB Inhale 1 puff into the lungs daily. Rinse mouth 07/02/19   Young, Tarri Fuller D, MD  glucose blood (FREESTYLE TEST STRIPS) test strip Use to test blood sugar 3 times daily. Dx: E11.65 10/14/16   Philemon Kingdom, MD  HYDROcodone-acetaminophen (NORCO/VICODIN) 5-325 MG tablet Take 1 tablet by mouth 2 (two) times daily as needed. 05/29/19    [provider]  Lancets (FREESTYLE) lancets Use to test blood sugar 3 times daily. Dx: E11.65 10/14/16   Philemon Kingdom, MD  LANTUS SOLOSTAR 100 UNIT/ML Solostar Pen INJECT 20 UNITS INTO THE SKIN EVERY MORNING. 07/31/19   Philemon Kingdom, MD  levothyroxine (SYNTHROID) 50 MCG tablet TAKE 1 TABLET BY MOUTH EVERY DAY BEFORE BREAKFAST 06/18/19   [name], MD  lidocaine (LIDODERM) 5 % SMARTSIG:1-3 Patch(s) Topical Every 12 Hours PRN 06/12/19   [provider]  magnesium oxide (MAG-OX) 400 MG tablet TAKE 1 TABLET BY MOUTH EVERY DAY 07/31/19   [name], MD  methenamine (MANDELAMINE) 1 g tablet SMARTSIG:1 Tablet(s) By Mouth Every 12 Hours 07/06/19   [provider]  Misc Natural Products (TART CHERRY ADVANCED PO) Take by mouth. Once daily    [provider]  montelukast (SINGULAIR) 10 MG tablet TAKE 1 TABLET BY MOUTH EVERY DAY 03/13/19   Baird Lyons D, MD  Multiple Vitamins-Minerals (CENTRUM ADULTS PO) Take 1 tablet by mouth daily.     [provider]  mupirocin ointment (BACTROBAN) 2 % Apply 1 application topically 2 (two) times daily. 06/05/19   Trula Slade, DPM  omeprazole (PRILOSEC) 20 MG capsule TAKE 1 CAPSULE BY MOUTH 2 TIMES A DAY BEFORE A MEAL 03/28/19   Mansouraty, Telford Nab., MD  oxyCODONE (OXY IR/ROXICODONE) 5 MG immediate release tablet Take 1 tablet (5 mg total) by mouth every 4 (four) hours as needed for severe pain. 10/12/19   Tacy Learn, PA-C  Polyethyl Glycol-Propyl Glycol (SYSTANE FREE OP) Apply 1 drop to eye 3 (three) times daily.    [provider]  predniSONE (DELTASONE) 1 MG tablet Take 2 tablets (2 mg total) by mouth daily. 07/25/18   [name], MD  rosuvastatin (CRESTOR) 10 MG tablet Take 1 tablet (10 mg total) by mouth every evening. 07/02/19   [name], MD  sulfamethoxazole-trimethoprim (BACTRIM  DS) 800-160 MG tablet Take 1 tablet by mouth 2 (two) times daily. 06/13/19    Marrian Salvage, FNP  Turmeric (QC TUMERIC COMPLEX PO) Take by mouth.    [provider]  UNABLE TO FIND Take 1,000 mg by mouth daily. Med Name: Tumeric    [provider]  UNABLE TO FIND Take 1,000 mg by mouth daily. Med Name: [facility]    [provider]  vitamin B-12 (CYANOCOBALAMIN) 250 MCG tablet Take 250 mcg by mouth See admin instructions. Twice weekly    [provider]  ipratropium-albuterol (DUONEB) 0.5-2.5 (3) MG/3ML SOLN USE 1 VIAL IN NEBULIZER 3 TIMES DAILY 09/04/18 09/04/18  Icard, Leory Plowman L, DO    Allergies    Latex and Penicillins  Review of Systems   Review of Systems  Constitutional: Negative for fever.  Gastrointestinal: Negative for abdominal pain.  Musculoskeletal: Positive for arthralgias and myalgias. Negative for back pain and joint swelling.  Skin: Negative for rash and wound.  Allergic/Immunologic: Positive for immunocompromised state.  Neurological: Negative for weakness and numbness.  Hematological: Negative for adenopathy.    Physical Exam Updated Vital Signs BP 104/85 (BP Location: Right Arm)   Pulse 99   Temp 98.1 F (36.7 C)   Resp 17   Ht 4\' 11"  (1.499 m)   Wt 64 kg   LMP  (LMP Unknown)   SpO2 100%   BMI 28.48 kg/m   Physical Exam Vitals and nursing note reviewed.  Constitutional:      General: She is not in acute distress.    Appearance: She is well-developed. She is not diaphoretic.  HENT:     Head: Normocephalic and atraumatic.  Cardiovascular:     Pulses: Normal pulses.  Pulmonary:     Effort: Pulmonary effort is normal.  Musculoskeletal:        General: Tenderness present. No swelling or deformity.     Lumbar back: No tenderness or bony tenderness.       Legs:     Comments: Pain with log roll left thigh  Skin:    General: Skin is warm and dry.     Findings: No erythema or rash.  Neurological:     Mental Status: She is alert and oriented to person, place, and time.     Sensory: No  sensory deficit.  Psychiatric:        Behavior: Behavior normal.     ED Results / Procedures / Treatments   Labs (all labs ordered are listed, but only abnormal results are displayed) Labs Reviewed  URINALYSIS, ROUTINE W REFLEX MICROSCOPIC    EKG None  Radiology DG Lumbar Spine Complete  Result Date: 10/12/2019 CLINICAL DATA:  Left hip pain. EXAM: LUMBAR SPINE - COMPLETE 4+ VIEW COMPARISON:  None. FINDINGS: There is no evidence of an acute lumbar spine fracture. There is approximately 2 mm retrolisthesis of the L1 vertebral body on L2. Marked severity multilevel endplate sclerosis is seen. Marked severity intervertebral disc space narrowing is seen at the levels of L1-L2, L3-L4 and L4-L5. Mild to moderate severity intervertebral disc space narrowing is seen at the level of L2-L3. There is marked severity calcification of the abdominal aorta and bilateral common iliac arteries. Multiple radiopaque surgical clips are seen within the lower abdomen and pelvis. IMPRESSION: Marked severity multilevel degenerative disc disease in the lumbar spine, most prominent at the levels of L1-L2, L3-L4 and L4-L5. Electronically Signed   By: Virgina Norfolk M.D.   On: 10/12/2019 20:04  US Venous Img Lower  Left (DVT Study)  Result Date: 10/12/2019 CLINICAL DATA:  Left leg pain EXAM: LEFT LOWER EXTREMITY VENOUS DOPPLER ULTRASOUND TECHNIQUE: Gray-scale sonography with compression, as well as color and duplex ultrasound, were performed to evaluate the deep venous system(s) from the level of the common femoral vein through the popliteal and proximal calf veins. COMPARISON:  None. FINDINGS: VENOUS Normal compressibility of the common femoral, superficial femoral, and popliteal veins, as well as the visualized calf veins. Visualized portions of profunda femoral vein and great saphenous vein unremarkable. No filling defects to suggest DVT on grayscale or color Doppler imaging. Doppler waveforms show normal direction  of venous flow, normal respiratory plasticity and response to augmentation. Limited views of the contralateral common femoral vein are unremarkable. OTHER A 3.6 x 3.8 x 0.8 cm fluid collection is seen within the popliteal fossa in keeping with a Baker's cyst. Limitations: none IMPRESSION: Negative. Electronically Signed   By: Fidela Salisbury MD   On: 10/12/2019 18:57   DG Hip Unilat With Pelvis 2-3 Views Left  Result Date: 10/12/2019 CLINICAL DATA:  Left hip pain. EXAM: DG HIP (WITH OR WITHOUT PELVIS) 2-3V LEFT COMPARISON:  October 11, 2017 FINDINGS: There is no evidence of acute hip fracture or dislocation. Chronic fracture deformities are seen involving the bilateral superior and bilateral inferior pubic rami. A radiopaque intramedullary rod and compression screw device are seen within the proximal right femur. Moderate to marked severity degenerative changes are noted within the visualized portion of the lower lumbar spine. Multiple radiopaque surgical clips are seen within the pelvis. IMPRESSION: 1. No acute fracture or dislocation. 2. Chronic fracture deformities of the bilateral superior and bilateral inferior pubic rami. Electronically Signed   By: Virgina Norfolk M.D.   On: 10/12/2019 20:00    Procedures Procedures (including critical care time)  Medications Ordered in ED Medications  lidocaine (LIDODERM) 5 % 1 patch (1 patch Transdermal Patch Applied 10/12/19 2028)  oxyCODONE (Oxy IR/ROXICODONE) immediate release tablet 5 mg (5 mg Oral Given 10/12/19 1911)    ED Course  I have reviewed the triage vital signs and the nursing notes.  Pertinent labs & imaging results that were available during my care of the patient were reviewed by me and considered in my medical decision making (see chart for details).  Clinical Course as of Oct 11 2049  Fri Oct 11, 6861  [id number] 84 year old female with complaint of pain in her left thigh onset this morning upon waking when she tried to stand.  On exam has  tenderness in the proximal left thigh, pain with logroll of the left hip.  No low back tenderness.  Venous Doppler of the left leg is negative for DVT.  X-ray of the lumbar spine and left hip show arthritic changes without acute bony injury.  Patient has had some relief of her pain with 5 mg oxycodone in the ER.  Plan is to discharge home on same, does live with her daughter.  Discussed how narcotic pain medications can make patient on study and a fall risk, patient's daughter is comfortable taking her home and using this medication if needed for pain control.  Plan is to follow-up with PCP next week.  Daughter is also concerned about UTI causing her pain, urinalysis is negative and patient does not have urinary symptoms.   [LM]    Clinical Course User Index [LM] Kayla Thompson   MDM Rules/Calculators/A&P  Final Clinical Impression(s) / ED Diagnoses Final diagnoses:  Radiculopathy of lumbar region  Left leg pain    Rx / DC Orders ED Discharge Orders         Ordered    oxyCODONE (OXY IR/ROXICODONE) 5 MG immediate release tablet  Every 4 hours PRN     Discontinue  Reprint     10/12/19 2017           Tacy Learn, PA-C 10/12/19 2051    Lennice Sites, DO 10/12/19 2241

## 2019-10-12 NOTE — Discharge Instructions (Addendum)
Take oxycodone as needed as prescribed for pain not controlled with your hydrocodone and Tylenol.  This medication can cause constipation, take Colace as needed as directed.  Follow-up with your doctor next week.

## 2019-10-12 NOTE — ED Triage Notes (Signed)
Pt c/o pain to L medial thigh that started last night. Pain worse with movement.

## 2019-10-12 NOTE — ED Notes (Signed)
Pt remains in US

## 2019-10-16 ENCOUNTER — Ambulatory Visit (INDEPENDENT_AMBULATORY_CARE_PROVIDER_SITE_OTHER)
Admission: RE | Admit: 2019-10-16 | Discharge: 2019-10-16 | Disposition: A | Discharge status: Home / Self Care | Payer: Medicare Other | Source: Ambulatory Visit | Attending: Internal Medicine | Admitting: Internal Medicine

## 2019-10-16 ENCOUNTER — Ambulatory Visit (INDEPENDENT_AMBULATORY_CARE_PROVIDER_SITE_OTHER): Payer: Medicare Other | Admitting: Internal Medicine

## 2019-10-16 ENCOUNTER — Other Ambulatory Visit: Payer: Self-pay

## 2019-10-16 ENCOUNTER — Encounter: Payer: Self-pay | Admitting: Internal Medicine

## 2019-10-16 VITALS — BP 136/82 | HR 93 | Temp 98.2°F | Ht 59.0 in | Wt 140.0 lb

## 2019-10-16 DIAGNOSIS — M19011 Primary osteoarthritis, right shoulder: Secondary | ICD-10-CM | POA: Diagnosis not present

## 2019-10-16 DIAGNOSIS — Z01810 Encounter for preprocedural cardiovascular examination: Secondary | ICD-10-CM

## 2019-10-16 DIAGNOSIS — M25512 Pain in left shoulder: Secondary | ICD-10-CM

## 2019-10-16 DIAGNOSIS — L97511 Non-pressure chronic ulcer of other part of right foot limited to breakdown of skin: Secondary | ICD-10-CM | POA: Diagnosis not present

## 2019-10-16 DIAGNOSIS — G8929 Other chronic pain: Secondary | ICD-10-CM

## 2019-10-16 DIAGNOSIS — M19012 Primary osteoarthritis, left shoulder: Secondary | ICD-10-CM | POA: Diagnosis not present

## 2019-10-16 DIAGNOSIS — E1165 Type 2 diabetes mellitus with hyperglycemia: Secondary | ICD-10-CM

## 2019-10-16 DIAGNOSIS — E039 Hypothyroidism, unspecified: Secondary | ICD-10-CM | POA: Diagnosis not present

## 2019-10-16 DIAGNOSIS — Z794 Long term (current) use of insulin: Secondary | ICD-10-CM

## 2019-10-16 DIAGNOSIS — J479 Bronchiectasis, uncomplicated: Secondary | ICD-10-CM

## 2019-10-16 DIAGNOSIS — E08621 Diabetes mellitus due to underlying condition with foot ulcer: Secondary | ICD-10-CM

## 2019-10-16 DIAGNOSIS — I700 Atherosclerosis of aorta: Secondary | ICD-10-CM | POA: Diagnosis not present

## 2019-10-16 DIAGNOSIS — M25711 Osteophyte, right shoulder: Secondary | ICD-10-CM | POA: Diagnosis not present

## 2019-10-16 DIAGNOSIS — M25511 Pain in right shoulder: Secondary | ICD-10-CM

## 2019-10-16 DIAGNOSIS — S4991XA Unspecified injury of right shoulder and upper arm, initial encounter: Secondary | ICD-10-CM | POA: Diagnosis not present

## 2019-10-16 NOTE — Progress Notes (Signed)
   Subjective:   Patient ID: Kayla Thompson, female    DOB: 17-Jun-1935, 84 y.o.   MRN: [id number]  HPI The patient is a 84 YO female coming in for pre-op cardiovascular consultation. She does have severe lung disease which is stable for several years now. She is on chronic prednisone therapy. She denies chest pains currently. Activity is severely limited due to arthritis and pain. She is going to have left shoulder replacement. Does have some chronic constipation due to medications and manages this to her satisfaction. Denies change in SOB.   Patient was trying to climb onto tablet unassisted and did fall during visit, was assessed afterwards and did have pain in the upper arm soft tissue without skin tear or bruising. X-ray left and right shoulder ordered to assess for fracture. She was not tender over the ribcage post fall. EKG was done in chair.   PMH, [facility], social history reviewed and updated  Review of Systems  Constitutional: Negative.   HENT: Negative.   Eyes: Negative.   Respiratory: Positive for shortness of breath. Negative for cough and chest tightness.   Cardiovascular: Negative for chest pain, palpitations and leg swelling.  Gastrointestinal: Negative for abdominal distention, abdominal pain, constipation, diarrhea, nausea and vomiting.  Musculoskeletal: Positive for arthralgias, back pain, gait problem and myalgias.  Skin: Negative.   Psychiatric/Behavioral: Negative.     Objective:  Physical Exam Constitutional:      Appearance: She is well-developed.  HENT:     Head: Normocephalic and atraumatic.  Cardiovascular:     Rate and Rhythm: Normal rate and regular rhythm.  Pulmonary:     Effort: Pulmonary effort is normal. No respiratory distress.     Breath sounds: Normal breath sounds. No wheezing or rales.  Abdominal:     General: Bowel sounds are normal. There is no distension.     Palpations: Abdomen is soft.     Tenderness: There is no abdominal tenderness. There is  no rebound.  Musculoskeletal:        General: Tenderness present.     Cervical back: Normal range of motion.     Comments: Pain over left shoulder and right shoulder and right and left soft tissue without bruising left, there is a small bruise right upper arm biceps region. No skin tear and no tenderness over the ribcage  Skin:    General: Skin is warm and dry.  Neurological:     Mental Status: She is alert and oriented to person, place, and time.     Coordination: Coordination abnormal.     Comments: Walker for ambulation normally, taken with wheelchair to lab after fall.      Vitals:   10/16/19 1331  BP: (!) 136/82  Pulse: 93  Temp: 98.2 F (36.8 C)  TempSrc: Oral  SpO2: 98%  Weight: 140 lb (63.5 kg)  Height: 4\' 11"  (1.499 m)   EKG: Rate 79, axis normal, interval normal, sinus, no st or t wave changes, no significant change when compared to 2019  This visit occurred during the SARS-CoV-2 public health emergency.  Safety protocols were in place, including screening questions prior to the visit, additional usage of staff PPE, and extensive cleaning of exam room while observing appropriate contact time as indicated for disinfecting solutions.   Assessment & Plan:

## 2019-10-16 NOTE — Patient Instructions (Signed)
The EKG is normal and we will fill out the clearance form.

## 2019-10-17 LAB — COMPREHENSIVE METABOLIC PANEL
AG Ratio: 1.7 (calc) (ref 1.0–2.5)
ALT: 36 U/L — ABNORMAL HIGH (ref 6–29)
AST: 26 U/L (ref 10–35)
Albumin: 3.6 g/dL (ref 3.6–5.1)
Alkaline phosphatase (APISO): 39 U/L (ref 37–153)
BUN: 20 mg/dL (ref 7–25)
CO2: 26 mmol/L (ref 20–32)
Calcium: 8.8 mg/dL (ref 8.6–10.4)
Chloride: 102 mmol/L (ref 98–110)
Creat: 0.86 mg/dL (ref 0.60–0.88)
Globulin: 2.1 g/dL (calc) (ref 1.9–3.7)
Glucose, Bld: 140 mg/dL — ABNORMAL HIGH (ref 65–99)
Potassium: 4.5 mmol/L (ref 3.5–5.3)
Sodium: 137 mmol/L (ref 135–146)
Total Bilirubin: 0.3 mg/dL (ref 0.2–1.2)
Total Protein: 5.7 g/dL — ABNORMAL LOW (ref 6.1–8.1)

## 2019-10-17 LAB — CBC
HCT: 32.6 % — ABNORMAL LOW (ref 35.0–45.0)
Hemoglobin: 11 g/dL — ABNORMAL LOW (ref 11.7–15.5)
MCH: 34.6 pg — ABNORMAL HIGH (ref 27.0–33.0)
MCHC: 33.7 g/dL (ref 32.0–36.0)
MCV: 102.5 fL — ABNORMAL HIGH (ref 80.0–100.0)
MPV: 9.2 fL (ref 7.5–12.5)
Platelets: 273 10*3/uL (ref 140–400)
RBC: 3.18 10*6/uL — ABNORMAL LOW (ref 3.80–5.10)
RDW: 12.3 % (ref 11.0–15.0)
WBC: 11.1 10*3/uL — ABNORMAL HIGH (ref 3.8–10.8)

## 2019-10-17 LAB — HEMOGLOBIN A1C
Hgb A1c MFr Bld: 6.3 % of total Hgb — ABNORMAL HIGH (ref <5.7)
Mean Plasma Glucose: 134 (calc)
eAG (mmol/L): 7.4 (calc)

## 2019-10-17 LAB — LIPID PANEL
Cholesterol: 132 mg/dL (ref <200)
HDL: 55 mg/dL (ref >=50)
LDL Cholesterol (Calc): 57 mg/dL (calc)
Non-HDL Cholesterol (Calc): 77 mg/dL (calc) (ref <130)
Total CHOL/HDL Ratio: 2.4 (calc) (ref <5.0)
Triglycerides: 114 mg/dL (ref <150)

## 2019-10-17 LAB — TSH: TSH: 0.76 mIU/L (ref 0.40–4.50)

## 2019-10-17 LAB — T4, FREE: Free T4: 1.1 ng/dL (ref 0.8–1.8)

## 2019-10-18 DIAGNOSIS — Z01810 Encounter for preprocedural cardiovascular examination: Secondary | ICD-10-CM | POA: Insufficient documentation

## 2019-10-18 NOTE — Assessment & Plan Note (Signed)
EKG done without changes. Assessed risk medium and discussed with patient for her chronic pulmonary conditions. We also discussed her chronic prednisone will likely cause her to not heal as quickly post-operatively. Okay to proceed to surgery without further evaluation. Labs done today stable.

## 2019-10-18 NOTE — Assessment & Plan Note (Signed)
This does make her slightly higher than average risk for surgery. This can be minimized with spinal or non-general sedation if possible. We did discuss this and this is not modifiable and she is having severe disability from her arthritis such that benefit from surgery likely outweighs risk of sedation. She is a non-smoker.

## 2019-10-18 NOTE — Assessment & Plan Note (Signed)
Healing and examined today during visit.

## 2019-10-18 NOTE — Assessment & Plan Note (Signed)
Control has previously been good. Checking HgA1c today and needs to be <8 for surgery.

## 2019-10-19 ENCOUNTER — Other Ambulatory Visit: Payer: Self-pay

## 2019-10-19 DIAGNOSIS — D509 Iron deficiency anemia, unspecified: Secondary | ICD-10-CM

## 2019-10-24 ENCOUNTER — Other Ambulatory Visit: Payer: Self-pay | Admitting: Orthopedic Surgery

## 2019-10-24 DIAGNOSIS — M19211 Secondary osteoarthritis, right shoulder: Secondary | ICD-10-CM | POA: Diagnosis not present

## 2019-10-24 DIAGNOSIS — M19212 Secondary osteoarthritis, left shoulder: Secondary | ICD-10-CM | POA: Diagnosis not present

## 2019-10-25 DIAGNOSIS — D72829 Elevated white blood cell count, unspecified: Secondary | ICD-10-CM | POA: Diagnosis not present

## 2019-10-25 DIAGNOSIS — Z79899 Other long term (current) drug therapy: Secondary | ICD-10-CM | POA: Diagnosis not present

## 2019-10-25 DIAGNOSIS — N183 Chronic kidney disease, stage 3 unspecified: Secondary | ICD-10-CM | POA: Diagnosis not present

## 2019-10-25 DIAGNOSIS — E119 Type 2 diabetes mellitus without complications: Secondary | ICD-10-CM | POA: Diagnosis not present

## 2019-10-25 DIAGNOSIS — M353 Polymyalgia rheumatica: Secondary | ICD-10-CM | POA: Diagnosis not present

## 2019-10-29 ENCOUNTER — Ambulatory Visit (INDEPENDENT_AMBULATORY_CARE_PROVIDER_SITE_OTHER): Payer: Medicare Other | Admitting: Podiatry

## 2019-10-29 ENCOUNTER — Other Ambulatory Visit: Payer: Self-pay

## 2019-10-29 ENCOUNTER — Ambulatory Visit: Payer: Medicare Other | Admitting: Orthotics

## 2019-10-29 DIAGNOSIS — E1149 Type 2 diabetes mellitus with other diabetic neurological complication: Secondary | ICD-10-CM

## 2019-10-29 DIAGNOSIS — L84 Corns and callosities: Secondary | ICD-10-CM

## 2019-10-29 DIAGNOSIS — B351 Tinea unguium: Secondary | ICD-10-CM

## 2019-10-29 DIAGNOSIS — M79674 Pain in right toe(s): Secondary | ICD-10-CM

## 2019-10-29 DIAGNOSIS — M2041 Other hammer toe(s) (acquired), right foot: Secondary | ICD-10-CM

## 2019-10-29 DIAGNOSIS — M79675 Pain in left toe(s): Secondary | ICD-10-CM | POA: Diagnosis not present

## 2019-10-29 NOTE — Progress Notes (Signed)
Since we didn't have a shoe she could try on; patient decided not to be fitted/cast for shoes/inserts.

## 2019-10-30 ENCOUNTER — Other Ambulatory Visit: Payer: Self-pay | Admitting: Internal Medicine

## 2019-11-05 NOTE — Progress Notes (Signed)
Subjective: 84 year old female presents to the office today for follow-up evaluation of right second toe pre-ulcerative calls/ulcer.  States that she is doing well and she has no other concerns today.  She is asked for nails be trimmed today as well as they are thick and elongated. Denies any fevers, chills, nausea, vomiting.  No calf pain, chest pain, shortness of breath.  Objective: AAO x3, NAD-presents with daughter DP/PT pulses palpable bilaterally, CRT less than 3 seconds Hammertoe contracture present of the right second toe resulting in a hyperkeratotic lesion on the dorsal PIPJ.  No underlying ulceration drainage or any signs of infection.  Mild chronic edema but there is no other signs of infection noted today.  Nails are hypertrophic, dystrophic, brittle, discolored, elongated 10. No surrounding redness or drainage. Tenderness nails 1-5 bilaterally. No open lesions or pre-ulcerative lesions are identified today. No pain with calf compression, swelling, warmth, erythema  Assessment: Right second toe preulcerative callus; symptomatic onychomycosis  Plan: -All treatment options discussed with the patient including all alternatives, risks, complications.  -Debrided hyperkeratotic tissue with any complications or bleeding.  Continue offloading surgical shoe for now.  She did not want to order shoes through our office because she could not try them on.  -Debrided the nails x10 without any complications or bleeding.  Return in about 3 weeks (around 11/19/2019).  Trula Slade DPM

## 2019-11-08 DIAGNOSIS — F419 Anxiety disorder, unspecified: Secondary | ICD-10-CM | POA: Diagnosis not present

## 2019-11-08 DIAGNOSIS — F331 Major depressive disorder, recurrent, moderate: Secondary | ICD-10-CM | POA: Diagnosis not present

## 2019-11-12 DIAGNOSIS — Z79899 Other long term (current) drug therapy: Secondary | ICD-10-CM | POA: Diagnosis not present

## 2019-11-12 DIAGNOSIS — M542 Cervicalgia: Secondary | ICD-10-CM | POA: Diagnosis not present

## 2019-11-12 DIAGNOSIS — M545 Low back pain: Secondary | ICD-10-CM | POA: Diagnosis not present

## 2019-11-12 DIAGNOSIS — R102 Pelvic and perineal pain: Secondary | ICD-10-CM | POA: Diagnosis not present

## 2019-11-15 DIAGNOSIS — M542 Cervicalgia: Secondary | ICD-10-CM | POA: Diagnosis not present

## 2019-11-15 DIAGNOSIS — M25511 Pain in right shoulder: Secondary | ICD-10-CM | POA: Diagnosis not present

## 2019-11-15 DIAGNOSIS — M545 Low back pain: Secondary | ICD-10-CM | POA: Diagnosis not present

## 2019-11-15 DIAGNOSIS — G8929 Other chronic pain: Secondary | ICD-10-CM | POA: Diagnosis not present

## 2019-11-15 DIAGNOSIS — M25562 Pain in left knee: Secondary | ICD-10-CM | POA: Diagnosis not present

## 2019-11-15 DIAGNOSIS — Z79899 Other long term (current) drug therapy: Secondary | ICD-10-CM | POA: Diagnosis not present

## 2019-11-15 DIAGNOSIS — M546 Pain in thoracic spine: Secondary | ICD-10-CM | POA: Diagnosis not present

## 2019-11-15 DIAGNOSIS — M25512 Pain in left shoulder: Secondary | ICD-10-CM | POA: Diagnosis not present

## 2019-11-15 DIAGNOSIS — M25561 Pain in right knee: Secondary | ICD-10-CM | POA: Diagnosis not present

## 2019-11-15 DIAGNOSIS — M25552 Pain in left hip: Secondary | ICD-10-CM | POA: Diagnosis not present

## 2019-11-15 DIAGNOSIS — M25551 Pain in right hip: Secondary | ICD-10-CM | POA: Diagnosis not present

## 2019-11-19 DIAGNOSIS — R600 Localized edema: Secondary | ICD-10-CM | POA: Diagnosis not present

## 2019-11-19 NOTE — Progress Notes (Signed)
Subjective:    Patient ID: Kayla Thompson, female    DOB: [birth year], 84 y.o.   MRN: [id number]  HPI The patient is here for an acute visit.  She is here with her home health aide and her daughters on the phone.  She has a history of diastolic HF.  Echo 07/2014 - EF 55-60%, grade 1 DD  Swollen legs - started 3-4 days ago.  Usually has none.  No obvious cause for the swelling.  She does like salt and is unclear how much she consumes.  There has been no recent changes in diet, medications.  Her lower legs are slightly erythematous and they hurt because they are so swollen.  Her toes hurt.  She is not having any fever or chills.  She denies any change in her breathing, but states she does not move much.  She sits most of the day.  She tends to sit with her legs down.  Her daughter did get her compression socks but they only come up to much lower than her knee and she was concerned that was too low.  No cause.  Redness, discomfort.    In July she was seen at the emergency room and was experiencing groin pain and had an ultrasound of her leg at that time which was normal.  She also had urine testing and blood work.    Medications and allergies reviewed with patient and updated if appropriate.  Patient Active Problem List   Diagnosis Date Noted  . Pre-operative cardiovascular examination 10/18/2019  . UTI symptoms 05/29/2019  . Dysuria 05/11/2019  . Upper esophageal web 03/27/2019  . Iron deficiency anemia 03/27/2019  . Candida esophagitis ([place]) 02/14/2019  . Encounter for general adult medical examination with abnormal findings 01/09/2019  . Abnormal barium swallow 12/28/2018  . Throat clearing 12/28/2018  . History of colonic polyps 12/28/2018  . Dysphagia 12/28/2018  . Diabetic foot ulcer ([place]) 12/05/2018  . Rotator cuff arthropathy, left 11/16/2018  . Degenerative arthritis of left knee 11/16/2018  . Insomnia 09/26/2018  . Leukocytosis 10/17/2017  . Malnutrition of moderate  degree 10/10/2017  . Memory changes 07/29/2017  . Granulomatous lung disease ([place]) 06/14/2017  . Tracheobronchomalacia 06/14/2017  . Dizziness 03/04/2017  . Exercise hypoxemia 07/12/2016  . Diastolic CHF, chronic ([place]) 12/25/2015  . GERD (gastroesophageal reflux disease) 12/25/2015  . Epidermal inclusion cyst 09/02/2015  . Type 2 diabetes mellitus with hyperglycemia, with long-term current use of insulin ([place]) 03/10/2015  . Muscle cramps 03/06/2015  . Bronchiectasis without acute exacerbation ([place]) 01/07/2015  . PMR (polymyalgia rheumatica) ([place]) 01/07/2015  . Osteoarthritis 01/07/2015  . Asthma, chronic 11/12/2014  . Anxiety state 11/12/2014  . Hyperparathyroidism ([place]) 10/28/2014  . Fibromyalgia 09/21/2014  . Personal history of ovarian cancer 08/23/2014  . Osteoporosis 08/23/2014  . Hypertension 08/02/2014  . HLD (hyperlipidemia) 08/02/2014  . Hypothyroidism 08/02/2014    Current Outpatient Medications on File Prior to Visit  Medication Sig Dispense Refill  . albuterol (PROAIR HFA) 108 (90 Base) MCG/ACT inhaler Inhale 2 puffs into the lungs every 6 (six) hours as needed for wheezing or shortness of breath. 18 g 12  . amitriptyline (ELAVIL) 10 MG tablet Take 1 tablet (10 mg total) by mouth at bedtime. 90 tablet 3  . baclofen (LIORESAL) 10 MG tablet TAKE 0.5-1 TABLET BY MOUTH TWICE DAILY AS NEEDED FOR MUSCLE SPASMS OR PAIN 180 tablet 1  . BD PEN NEEDLE NANO 2ND GEN 32G X 4 MM MISC USE AS  DIRECTED 4 TIMES A DAY 100 each 5  . busPIRone (BUSPAR) 5 MG tablet TAKE 1 TO 2 TABLETS BY MOUTH TWICE A DAY (Patient taking differently: Take 5-10 mg by mouth daily as needed (anxiety). ) 360 tablet 1  . calcium carbonate (OSCAL) 1500 (600 Ca) MG TABS tablet Take 1,500 mg by mouth 2 (two) times daily with a meal.     . carvedilol (COREG) 3.125 MG tablet Take 1 tablet (3.125 mg total) by mouth 2 (two) times daily with a meal. 180 tablet 1  . Cholecalciferol (VITAMIN D3) 2000 units TABS Take 2  tablets by mouth daily.     Marland Kitchen Dextromethorphan-Guaifenesin (MUCINEX DM MAXIMUM STRENGTH) 60-1200 MG TB12 Take 1 tablet by mouth 2 (two) times daily. Reported on 06/23/2015    . DULoxetine (CYMBALTA) 30 MG capsule Take 1 capsule (30 mg total) by mouth daily. Annual appt due in Oct must see provider for future refills 90 capsule 1  . DULoxetine (CYMBALTA) 60 MG capsule Take 1 capsule (60 mg total) by mouth daily. 90 capsule 3  . ferrous gluconate (FERGON) 324 MG tablet TAKE 1 TABLET BY MOUTH EVERY DAY WITH BREAKFAST 90 tablet 2  . Fluticasone-Umeclidin-Vilant (TRELEGY ELLIPTA) 100-62.5-25 MCG/INH AEPB Inhale 1 puff into the lungs daily. Rinse mouth 60 each 11  . glucose blood (FREESTYLE TEST STRIPS) test strip Use to test blood sugar 3 times daily. Dx: E11.65 100 each 3  . HYDROcodone-acetaminophen (NORCO) 10-325 MG tablet Take 1 tablet by mouth every 4 (four) hours as needed.    . Lancets (FREESTYLE) lancets Use to test blood sugar 3 times daily. Dx: E11.65 100 each 3  . LANTUS SOLOSTAR 100 UNIT/ML Solostar Pen INJECT 20 UNITS INTO THE SKIN EVERY MORNING. (Patient taking differently: 15 Units. ) 18 mL 8  . levothyroxine (SYNTHROID) 50 MCG tablet TAKE 1 TABLET BY MOUTH EVERY DAY BEFORE BREAKFAST 90 tablet 1  . lidocaine (LIDODERM) 5 % SMARTSIG:1-3 Patch(s) Topical Every 12 Hours PRN    . magnesium oxide (MAG-OX) 400 MG tablet TAKE 1 TABLET BY MOUTH EVERY DAY 90 tablet 1  . methenamine (MANDELAMINE) 1 g tablet SMARTSIG:1 Tablet(s) By Mouth Every 12 Hours    . Misc Natural Products (TART CHERRY ADVANCED PO) Take by mouth. Once daily    . montelukast (SINGULAIR) 10 MG tablet TAKE 1 TABLET BY MOUTH EVERY DAY 90 tablet 2  . Multiple Vitamins-Minerals (CENTRUM ADULTS PO) Take 1 tablet by mouth daily.     Marland Kitchen omeprazole (PRILOSEC) 20 MG capsule TAKE 1 CAPSULE BY MOUTH 2 TIMES A DAY BEFORE A MEAL 180 capsule 1  . predniSONE (DELTASONE) 1 MG tablet Take 2 tablets (2 mg total) by mouth daily. 60 tablet 1  .  rosuvastatin (CRESTOR) 10 MG tablet Take 1 tablet (10 mg total) by mouth every evening. 90 tablet 1  . Turmeric (QC TUMERIC COMPLEX PO) Take by mouth.    Marland Kitchen UNABLE TO FIND Take 1,000 mg by mouth daily. Med Name: Tumeric    . UNABLE TO FIND Take 1,000 mg by mouth daily. Med Name: [place]    . vitamin B-12 (CYANOCOBALAMIN) 250 MCG tablet Take 250 mcg by mouth See admin instructions. Twice weekly    . [DISCONTINUED] ipratropium-albuterol (DUONEB) 0.5-2.5 (3) MG/3ML SOLN USE 1 VIAL IN NEBULIZER 3 TIMES DAILY 90 mL 2   No current facility-administered medications on file prior to visit.    Past Medical History:  Diagnosis Date  . Anxiety   . Arthritis   .  Asthma   . Cataracts, bilateral    removed by surgery  . COPD (chronic obstructive pulmonary disease) ([place])   . Depression   . Diabetes mellitus without complication ([place])    type 2  . Dyspnea    with exertion  . Fibromyalgia   . GERD (gastroesophageal reflux disease)   . History of chemotherapy   . History of fractured pelvis   . Hypertension   . Hypothyroidism   . Osteoporosis 12/2017   T score -2.8 stable from prior DEXA  . Osteoporosis   . Ovarian cancer ([place])   . Pneumonia    several times    Past Surgical History:  Procedure Laterality Date  . ABDOMINAL HYSTERECTOMY  1996  . ANKLE FRACTURE SURGERY     plate and screws  . BIOPSY  02/05/2019   Procedure: BIOPSY;  Surgeon: Rush Landmark Telford Nab., MD;  Location: [place];  Service: Gastroenterology;;  . BLADDER SUSPENSION    . CATARACT EXTRACTION    . COLONOSCOPY    . ESOPHAGEAL BRUSHING  02/05/2019   Procedure: ESOPHAGEAL BRUSHING;  Surgeon: Rush Landmark Telford Nab., MD;  Location: [facility];  Service: Gastroenterology;;  . ESOPHAGOGASTRODUODENOSCOPY (EGD) WITH PROPOFOL N/A 02/05/2019   Procedure: ESOPHAGOGASTRODUODENOSCOPY (EGD) WITH PROPOFOL with dialtion;  Surgeon: Irving Copas., MD;  Location: [facility];  Service: Gastroenterology;   Laterality: N/A;  . EYE SURGERY     cataracts removed-bilateral  . hip surgey    . knee surgey    . LUNG SURGERY    . OOPHORECTOMY     BSO  . SAVORY DILATION N/A 02/05/2019   Procedure: SAVORY DILATION;  Surgeon: Rush Landmark Telford Nab., MD;  Location: [facility];  Service: Gastroenterology;  Laterality: N/A;  . TONSILLECTOMY    . UPPER GI ENDOSCOPY      Social History   Socioeconomic History  . Marital status: Widowed    Spouse name: Not on file  . Number of children: 2  . Years of education: Not on file  . Highest education level: Not on file  Occupational History  . Not on file  Tobacco Use  . Smoking status: Never Smoker  . Smokeless tobacco: Never Used  Vaping Use  . Vaping Use: Never used  Substance and Sexual Activity  . Alcohol use: No    Alcohol/week: 0.0 standard drinks  . Drug use: No  . Sexual activity: Not Currently    Birth control/protection: Post-menopausal    Comment: declined sexual Hx questions  Other Topics Concern  . Not on file  Social History Narrative  . Not on file   Social Determinants of Health   Financial Resource Strain:   . Difficulty of Paying Living Expenses: Not on file  Food Insecurity:   . Worried About Charity fundraiser in the Last Year: Not on file  . Ran Out of Food in the Last Year: Not on file  Transportation Needs:   . Lack of Transportation (Medical): Not on file  . Lack of Transportation (Non-Medical): Not on file  Physical Activity:   . Days of Exercise per Week: Not on file  . Minutes of Exercise per Session: Not on file  Stress:   . Feeling of Stress : Not on file  Social Connections:   . Frequency of Communication with Friends and Family: Not on file  . Frequency of Social Gatherings with Friends and Family: Not on file  . Attends Religious Services: Not on file  . Active Member of Clubs or  Organizations: Not on file  . Attends Archivist Meetings: Not on file  . Marital Status: Not on file     Family History  Problem Relation Age of Onset  . Lung cancer Mother   . CAD Father   . Diabetes Father   . Lung cancer Maternal Aunt   . Diabetes Paternal Aunt   . Diabetes Paternal Uncle   . Diabetes Paternal Grandmother   . Diabetes Paternal Grandfather   . Colon cancer Neg Hx   . Esophageal cancer Neg Hx   . Inflammatory bowel disease Neg Hx   . Liver disease Neg Hx   . Pancreatic cancer Neg Hx   . Rectal cancer Neg Hx   . Stomach cancer Neg Hx     Review of Systems  Constitutional: Negative for chills and fever.       Weaker  Respiratory: Positive for cough (no change ), shortness of breath (no change) and wheezing (no change).   Cardiovascular: Positive for leg swelling. Negative for chest pain and palpitations.  Skin: Positive for color change (Lower leg slightly red).  Neurological: Negative for headaches.       Objective:   Vitals:   11/20/19 1447  BP: 138/82  Pulse: 85  Temp: 97.8 F (36.6 C)  SpO2: 97%   BP Readings from Last 3 Encounters:  11/20/19 138/82  10/16/19 (!) 136/82  10/12/19 104/85   Wt Readings from Last 3 Encounters:  11/20/19 145 lb (65.8 kg)  10/16/19 140 lb (63.5 kg)  10/12/19 141 lb (64 kg)   Body mass index is 29.29 kg/m.   Physical Exam    Constitutional: Appears well-developed and well-nourished. No distress.  Head: Normocephalic and atraumatic.  Neck: Neck supple. No tracheal deviation present. No thyromegaly present.  No cervical lymphadenopathy Cardiovascular: Normal rate, regular rhythm and normal heart sounds.  2/6 sys murmur heard.  2+ bilateral lower extremity mildly pitting edema  Pulmonary/Chest: Effort normal. No respiratory distress.  Right lower lung with rhonchi.  Left lung clear to auscultation Skin: Skin is warm and dry. Not diaphoretic.  Psychiatric: Normal mood and affect. Behavior is normal.       Assessment & Plan:    Bilateral lower extremity edema, possibly increasing cough: States 3-4 days  of bilateral lower extremity edema, which is new for her.  She does have a history of chronic diastolic heart failure-grade 1 diastolic dysfunction on previous echo No obvious cause for swelling, but is not necessarily compliant with a low-sodium diet and does sit with her legs down all day and is fairly sedentary which are all likely contributing Questionable worsening cough and shortness of breath, but no obvious infection Concern for exacerbation of diastolic heart failure, possible pneumonia versus fluid overload for the above reasons Chest x-ray today Blood work-BNP, CMP, CBC Advised to elevate legs and decrease sodium intake Start Lasix 40 mg daily for 3 days then 20 mg daily    Reviewed blood work and chest x-ray with daughter.  Continue Lasix as prescribed.  Repeat BMP in 1 week They will monitor her symptoms closely and update me-she may require further evaluation depending on response to Lasix   This visit occurred during the SARS-CoV-2 public health emergency.  Safety protocols were in place, including screening questions prior to the visit, additional usage of staff PPE, and extensive cleaning of exam room while observing appropriate contact time as indicated for disinfecting solutions.

## 2019-11-20 ENCOUNTER — Other Ambulatory Visit: Payer: Self-pay

## 2019-11-20 ENCOUNTER — Encounter: Payer: Self-pay | Admitting: Internal Medicine

## 2019-11-20 ENCOUNTER — Other Ambulatory Visit (INDEPENDENT_AMBULATORY_CARE_PROVIDER_SITE_OTHER): Payer: Medicare Other

## 2019-11-20 ENCOUNTER — Ambulatory Visit (INDEPENDENT_AMBULATORY_CARE_PROVIDER_SITE_OTHER)
Admission: RE | Admit: 2019-11-20 | Discharge: 2019-11-20 | Disposition: A | Discharge status: Home / Self Care | Payer: Medicare Other | Source: Ambulatory Visit | Attending: Internal Medicine | Admitting: Internal Medicine

## 2019-11-20 ENCOUNTER — Ambulatory Visit (INDEPENDENT_AMBULATORY_CARE_PROVIDER_SITE_OTHER): Payer: Medicare Other | Admitting: Internal Medicine

## 2019-11-20 ENCOUNTER — Ambulatory Visit: Payer: Medicare Other | Admitting: Podiatry

## 2019-11-20 VITALS — BP 138/82 | HR 85 | Temp 97.8°F | Wt 145.0 lb

## 2019-11-20 DIAGNOSIS — R600 Localized edema: Secondary | ICD-10-CM | POA: Diagnosis not present

## 2019-11-20 DIAGNOSIS — R05 Cough: Secondary | ICD-10-CM

## 2019-11-20 DIAGNOSIS — I5032 Chronic diastolic (congestive) heart failure: Secondary | ICD-10-CM

## 2019-11-20 DIAGNOSIS — R059 Cough, unspecified: Secondary | ICD-10-CM

## 2019-11-20 DIAGNOSIS — R0989 Other specified symptoms and signs involving the circulatory and respiratory systems: Secondary | ICD-10-CM | POA: Diagnosis not present

## 2019-11-20 DIAGNOSIS — R0602 Shortness of breath: Secondary | ICD-10-CM | POA: Diagnosis not present

## 2019-11-20 DIAGNOSIS — J189 Pneumonia, unspecified organism: Secondary | ICD-10-CM | POA: Diagnosis not present

## 2019-11-20 LAB — CBC WITH DIFFERENTIAL/PLATELET
Basophils Absolute: 0 10*3/uL (ref 0.0–0.1)
Basophils Relative: 0.4 % (ref 0.0–3.0)
Eosinophils Absolute: 0.2 10*3/uL (ref 0.0–0.7)
Eosinophils Relative: 2.4 % (ref 0.0–5.0)
HCT: 34.7 % — ABNORMAL LOW (ref 36.0–46.0)
Hemoglobin: 11.6 g/dL — ABNORMAL LOW (ref 12.0–15.0)
Lymphocytes Relative: 27.9 % (ref 12.0–46.0)
Lymphs Abs: 2.9 10*3/uL (ref 0.7–4.0)
MCHC: 33.5 g/dL (ref 30.0–36.0)
MCV: 105.9 fl — ABNORMAL HIGH (ref 78.0–100.0)
Monocytes Absolute: 0.5 10*3/uL (ref 0.1–1.0)
Monocytes Relative: 5 % (ref 3.0–12.0)
Neutro Abs: 6.6 10*3/uL (ref 1.4–7.7)
Neutrophils Relative %: 64.3 % (ref 43.0–77.0)
Platelets: 290 10*3/uL (ref 150.0–400.0)
RBC: 3.28 Mil/uL — ABNORMAL LOW (ref 3.87–5.11)
RDW: 13.8 % (ref 11.5–15.5)
WBC: 10.3 10*3/uL (ref 4.0–10.5)

## 2019-11-20 LAB — COMPREHENSIVE METABOLIC PANEL
ALT: 27 U/L (ref 0–35)
AST: 23 U/L (ref 0–37)
Albumin: 4 g/dL (ref 3.5–5.2)
Alkaline Phosphatase: 34 U/L — ABNORMAL LOW (ref 39–117)
BUN: 22 mg/dL (ref 6–23)
CO2: 26 mEq/L (ref 19–32)
Calcium: 9.1 mg/dL (ref 8.4–10.5)
Chloride: 99 mEq/L (ref 96–112)
Creatinine, Ser: 1.04 mg/dL (ref 0.40–1.20)
GFR: 50.68 mL/min — ABNORMAL LOW (ref >60.00)
Glucose, Bld: 134 mg/dL — ABNORMAL HIGH (ref 70–99)
Potassium: 4.5 mEq/L (ref 3.5–5.1)
Sodium: 134 mEq/L — ABNORMAL LOW (ref 135–145)
Total Bilirubin: 0.4 mg/dL (ref 0.2–1.2)
Total Protein: 6.5 g/dL (ref 6.0–8.3)

## 2019-11-20 LAB — BRAIN NATRIURETIC PEPTIDE: Pro B Natriuretic peptide (BNP): 58 pg/mL (ref 0.0–100.0)

## 2019-11-20 MED ORDER — FUROSEMIDE 20 MG PO TABS: ORAL_TABLET | ORAL | 3 refills | Status: DC

## 2019-11-20 NOTE — Patient Instructions (Signed)
  Blood work was ordered.  Chest xray ordered.    Medications reviewed and updated.  Changes include :   Start lasix 40 mg daily for 3 days and then 20 mg daily.  Your prescription(s) have been submitted to your pharmacy. Please take as directed and contact our office if you believe you are having problem(s) with the medication(s).

## 2019-11-22 ENCOUNTER — Other Ambulatory Visit: Payer: Self-pay | Admitting: Internal Medicine

## 2019-11-22 ENCOUNTER — Ambulatory Visit: Payer: Medicare Other | Admitting: Podiatry

## 2019-11-25 ENCOUNTER — Other Ambulatory Visit: Payer: Self-pay | Admitting: Gastroenterology

## 2019-11-25 MED ORDER — IPRATROPIUM-ALBUTEROL 0.5-2.5 (3) MG/3ML IN SOLN
RESPIRATORY_TRACT | 12 refills | Status: DC
Start: 2019-11-25 — End: 2020-01-15

## 2019-11-25 NOTE — Addendum Note (Signed)
Addended by: Baird Lyons D on: 11/25/2019 02:01 PM   Modules accepted: Orders

## 2019-11-25 NOTE — Telephone Encounter (Signed)
Duoneb neb solution refilled with [facility]

## 2019-11-27 ENCOUNTER — Other Ambulatory Visit: Payer: Self-pay

## 2019-11-27 ENCOUNTER — Other Ambulatory Visit: Payer: Medicare Other

## 2019-11-27 DIAGNOSIS — R600 Localized edema: Secondary | ICD-10-CM | POA: Diagnosis not present

## 2019-11-28 LAB — BASIC METABOLIC PANEL WITH GFR
BUN: 17 mg/dL (ref 7–25)
CO2: 29 mmol/L (ref 20–32)
Calcium: 8.9 mg/dL (ref 8.6–10.4)
Chloride: 100 mmol/L (ref 98–110)
Creat: 0.81 mg/dL (ref 0.60–0.88)
GFR, Est African American: 78 mL/min/{1.73_m2} (ref >=60)
GFR, Est Non African American: 68 mL/min/{1.73_m2} (ref >=60)
Glucose, Bld: 202 mg/dL — ABNORMAL HIGH (ref 65–99)
Potassium: 4.1 mmol/L (ref 3.5–5.3)
Sodium: 137 mmol/L (ref 135–146)

## 2019-11-30 ENCOUNTER — Telehealth: Payer: Self-pay | Admitting: Internal Medicine

## 2019-11-30 NOTE — Telephone Encounter (Signed)
Patient would like a call back regarding lab results.  [phone]

## 2019-11-30 NOTE — Telephone Encounter (Signed)
Notified pt w/MD response.. See lab report..." Kidney function and potassium are normal."...Kayla Thompson

## 2019-12-04 ENCOUNTER — Telehealth: Payer: Self-pay | Admitting: Internal Medicine

## 2019-12-04 DIAGNOSIS — R42 Dizziness and giddiness: Secondary | ICD-10-CM | POA: Diagnosis not present

## 2019-12-04 DIAGNOSIS — Z794 Long term (current) use of insulin: Secondary | ICD-10-CM | POA: Diagnosis not present

## 2019-12-04 DIAGNOSIS — E119 Type 2 diabetes mellitus without complications: Secondary | ICD-10-CM | POA: Diagnosis not present

## 2019-12-04 NOTE — Telephone Encounter (Signed)
noted 

## 2019-12-04 NOTE — Telephone Encounter (Signed)
Patient fell twice yesterday, blood sugar elevated, going to urgent care.

## 2019-12-06 ENCOUNTER — Ambulatory Visit (INDEPENDENT_AMBULATORY_CARE_PROVIDER_SITE_OTHER): Payer: Medicare Other | Admitting: Podiatry

## 2019-12-06 ENCOUNTER — Other Ambulatory Visit: Payer: Self-pay

## 2019-12-06 DIAGNOSIS — M79675 Pain in left toe(s): Secondary | ICD-10-CM | POA: Diagnosis not present

## 2019-12-06 DIAGNOSIS — M79674 Pain in right toe(s): Secondary | ICD-10-CM

## 2019-12-06 DIAGNOSIS — B351 Tinea unguium: Secondary | ICD-10-CM

## 2019-12-06 DIAGNOSIS — E1149 Type 2 diabetes mellitus with other diabetic neurological complication: Secondary | ICD-10-CM | POA: Diagnosis not present

## 2019-12-06 DIAGNOSIS — L84 Corns and callosities: Secondary | ICD-10-CM

## 2019-12-07 ENCOUNTER — Ambulatory Visit (INDEPENDENT_AMBULATORY_CARE_PROVIDER_SITE_OTHER): Payer: Medicare Other | Admitting: Family

## 2019-12-07 ENCOUNTER — Other Ambulatory Visit: Payer: Self-pay

## 2019-12-07 VITALS — BP 136/70 | Temp 97.9°F | Ht 59.0 in | Wt 144.0 lb

## 2019-12-07 DIAGNOSIS — R600 Localized edema: Secondary | ICD-10-CM

## 2019-12-07 DIAGNOSIS — R7989 Other specified abnormal findings of blood chemistry: Secondary | ICD-10-CM

## 2019-12-07 DIAGNOSIS — I5032 Chronic diastolic (congestive) heart failure: Secondary | ICD-10-CM | POA: Diagnosis not present

## 2019-12-07 DIAGNOSIS — R42 Dizziness and giddiness: Secondary | ICD-10-CM | POA: Diagnosis not present

## 2019-12-07 MED ORDER — SULFAMETHOXAZOLE-TRIMETHOPRIM 800-160 MG PO TABS: 1.0000 | ORAL_TABLET | Freq: Two times a day (BID) | ORAL | 0 refills | Status: DC

## 2019-12-07 NOTE — Progress Notes (Signed)
Kayla Thompson is a 84 y.o. female with the following history as recorded in EpicCare:  Patient Active Problem List   Diagnosis Date Noted  . Bilateral leg edema 11/20/2019  . Pre-operative cardiovascular examination 10/18/2019  . UTI symptoms 05/29/2019  . Dysuria 05/11/2019  . Upper esophageal web 03/27/2019  . Iron deficiency anemia 03/27/2019  . Candida esophagitis ([place]) 02/14/2019  . Encounter for general adult medical examination with abnormal findings 01/09/2019  . Abnormal barium swallow 12/28/2018  . Throat clearing 12/28/2018  . History of colonic polyps 12/28/2018  . Dysphagia 12/28/2018  . Diabetic foot ulcer ([place]) 12/05/2018  . Rotator cuff arthropathy, left 11/16/2018  . Degenerative arthritis of left knee 11/16/2018  . Insomnia 09/26/2018  . Leukocytosis 10/17/2017  . Malnutrition of moderate degree 10/10/2017  . Memory changes 07/29/2017  . Granulomatous lung disease ([place]) 06/14/2017  . Tracheobronchomalacia 06/14/2017  . Dizziness 03/04/2017  . Exercise hypoxemia 07/12/2016  . Diastolic CHF, chronic ([place]) 12/25/2015  . GERD (gastroesophageal reflux disease) 12/25/2015  . Epidermal inclusion cyst 09/02/2015  . Type 2 diabetes mellitus with hyperglycemia, with long-term current use of insulin ([place]) 03/10/2015  . Muscle cramps 03/06/2015  . Cough 01/07/2015  . Bronchiectasis without acute exacerbation ([place]) 01/07/2015  . PMR (polymyalgia rheumatica) ([place]) 01/07/2015  . Osteoarthritis 01/07/2015  . Asthma, chronic 11/12/2014  . Anxiety state 11/12/2014  . Hyperparathyroidism ([place]) 10/28/2014  . Fibromyalgia 09/21/2014  . Personal history of ovarian cancer 08/23/2014  . Osteoporosis 08/23/2014  . Hypertension 08/02/2014  . HLD (hyperlipidemia) 08/02/2014  . Hypothyroidism 08/02/2014    Current Outpatient Medications  Medication Sig Dispense Refill  . albuterol (PROAIR HFA) 108 (90 Base) MCG/ACT inhaler Inhale 2 puffs into the lungs every 6 (six) hours as  needed for wheezing or shortness of breath. 18 g 12  . amitriptyline (ELAVIL) 10 MG tablet Take 1 tablet (10 mg total) by mouth at bedtime. 90 tablet 3  . baclofen (LIORESAL) 10 MG tablet TAKE 0.5-1 TABLET BY MOUTH TWICE DAILY AS NEEDED FOR MUSCLE SPASMS OR PAIN 180 tablet 1  . BD PEN NEEDLE NANO 2ND GEN 32G X 4 MM MISC USE AS DIRECTED 4 TIMES A DAY 100 each 5  . busPIRone (BUSPAR) 5 MG tablet TAKE 1 TO 2 TABLETS BY MOUTH TWICE A DAY (Patient taking differently: Take 5-10 mg by mouth daily as needed (anxiety). ) 360 tablet 1  . calcium carbonate (OSCAL) 1500 (600 Ca) MG TABS tablet Take 1,500 mg by mouth 2 (two) times daily with a meal.     . carvedilol (COREG) 3.125 MG tablet Take 1 tablet (3.125 mg total) by mouth 2 (two) times daily with a meal. 180 tablet 1  . Cholecalciferol (VITAMIN D3) 2000 units TABS Take 2 tablets by mouth daily.     Marland Kitchen Dextromethorphan-Guaifenesin (MUCINEX DM MAXIMUM STRENGTH) 60-1200 MG TB12 Take 1 tablet by mouth 2 (two) times daily. Reported on 06/23/2015    . DULoxetine (CYMBALTA) 30 MG capsule Take 1 capsule (30 mg total) by mouth daily. Annual appt due in Oct must see provider for future refills 90 capsule 1  . DULoxetine (CYMBALTA) 60 MG capsule Take 1 capsule (60 mg total) by mouth daily. 90 capsule 3  . ferrous gluconate (FERGON) 324 MG tablet TAKE 1 TABLET BY MOUTH EVERY DAY WITH BREAKFAST 90 tablet 2  . Fluticasone-Umeclidin-Vilant (TRELEGY ELLIPTA) 100-62.5-25 MCG/INH AEPB Inhale 1 puff into the lungs daily. Rinse mouth 60 each 11  . furosemide (LASIX) 20 MG tablet  Take 40 mg daily x 3 days then 20 mg daily 30 tablet 3  . glucose blood (FREESTYLE TEST STRIPS) test strip Use to test blood sugar 3 times daily. Dx: E11.65 100 each 3  . HYDROcodone-acetaminophen (NORCO) 10-325 MG tablet Take 1 tablet by mouth every 4 (four) hours as needed.    Marland Kitchen ipratropium-albuterol (DUONEB) 0.5-2.5 (3) MG/3ML SOLN USE 1 VIAL IN NEBULIZER 3 TIMES DAILY 90 mL 12  . Lancets (FREESTYLE)  lancets Use to test blood sugar 3 times daily. Dx: E11.65 100 each 3  . LANTUS SOLOSTAR 100 UNIT/ML Solostar Pen INJECT 20 UNITS INTO THE SKIN EVERY MORNING. (Patient taking differently: 15 Units. ) 18 mL 8  . levothyroxine (SYNTHROID) 50 MCG tablet TAKE 1 TABLET BY MOUTH EVERY DAY BEFORE BREAKFAST 90 tablet 1  . lidocaine (LIDODERM) 5 % SMARTSIG:1-3 Patch(s) Topical Every 12 Hours PRN    . magnesium oxide (MAG-OX) 400 MG tablet TAKE 1 TABLET BY MOUTH EVERY DAY 90 tablet 1  . methenamine (MANDELAMINE) 1 g tablet SMARTSIG:1 Tablet(s) By Mouth Every 12 Hours    . Misc Natural Products (TART CHERRY ADVANCED PO) Take by mouth. Once daily    . montelukast (SINGULAIR) 10 MG tablet TAKE 1 TABLET BY MOUTH EVERY DAY 90 tablet 2  . Multiple Vitamins-Minerals (CENTRUM ADULTS PO) Take 1 tablet by mouth daily.     Marland Kitchen omeprazole (PRILOSEC) 20 MG capsule TAKE 1 CAPSULE BY MOUTH 2 TIMES A DAY BEFORE A MEAL 180 capsule 1  . predniSONE (DELTASONE) 1 MG tablet Take 2 tablets (2 mg total) by mouth daily. 60 tablet 1  . rosuvastatin (CRESTOR) 10 MG tablet Take 1 tablet (10 mg total) by mouth every evening. 90 tablet 1  . Turmeric (QC TUMERIC COMPLEX PO) Take by mouth.    Marland Kitchen UNABLE TO FIND Take 1,000 mg by mouth daily. Med Name: Tumeric    . UNABLE TO FIND Take 1,000 mg by mouth daily. Med Name: [place]annie [place]    . vitamin B-12 (CYANOCOBALAMIN) 250 MCG tablet Take 250 mcg by mouth See admin instructions. Twice weekly    . sulfamethoxazole-trimethoprim (BACTRIM DS) 800-160 MG tablet Take 1 tablet by mouth 2 (two) times daily. 10 tablet 0   No current facility-administered medications for this visit.    Allergies: Latex and Penicillins  Past Medical History:  Diagnosis Date  . Anxiety   . Arthritis   . Asthma   . Cataracts, bilateral    removed by surgery  . COPD (chronic obstructive pulmonary disease) ([place])   . Depression   . Diabetes mellitus without complication ([place])    type 2  . Dyspnea    with exertion  .  Fibromyalgia   . GERD (gastroesophageal reflux disease)   . History of chemotherapy   . History of fractured pelvis   . Hypertension   . Hypothyroidism   . Osteoporosis 12/2017   T score -2.8 stable from prior DEXA  . Osteoporosis   . Ovarian cancer ([place])   . Pneumonia    several times    Past Surgical History:  Procedure Laterality Date  . ABDOMINAL HYSTERECTOMY  1996  . ANKLE FRACTURE SURGERY     plate and screws  . BIOPSY  02/05/2019   Procedure: BIOPSY;  Surgeon: Rush Landmark Telford Nab., MD;  Location: [facility];  Service: Gastroenterology;;  . BLADDER SUSPENSION    . CATARACT EXTRACTION    . COLONOSCOPY    . ESOPHAGEAL BRUSHING  02/05/2019   Procedure:  ESOPHAGEAL BRUSHING;  Surgeon: Rush Landmark Telford Nab., MD;  Location: [place];  Service: Gastroenterology;;  . ESOPHAGOGASTRODUODENOSCOPY (EGD) WITH PROPOFOL N/A 02/05/2019   Procedure: ESOPHAGOGASTRODUODENOSCOPY (EGD) WITH PROPOFOL with dialtion;  Surgeon: Rush Landmark Telford Nab., MD;  Location: [facility];  Service: Gastroenterology;  Laterality: N/A;  . EYE SURGERY     cataracts removed-bilateral  . hip surgey    . knee surgey    . LUNG SURGERY    . OOPHORECTOMY     BSO  . SAVORY DILATION N/A 02/05/2019   Procedure: SAVORY DILATION;  Surgeon: Rush Landmark Telford Nab., MD;  Location: [facility];  Service: Gastroenterology;  Laterality: N/A;  . TONSILLECTOMY    . UPPER GI ENDOSCOPY      Family History  Problem Relation Age of Onset  . Lung cancer Mother   . CAD Father   . Diabetes Father   . Lung cancer Maternal Aunt   . Diabetes Paternal Aunt   . Diabetes Paternal Uncle   . Diabetes Paternal Grandmother   . Diabetes Paternal Grandfather   . Colon cancer Neg Hx   . Esophageal cancer Neg Hx   . Inflammatory bowel disease Neg Hx   . Liver disease Neg Hx   . Pancreatic cancer Neg Hx   . Rectal cancer Neg Hx   . Stomach cancer Neg Hx     Social History   Tobacco Use  . Smoking status: Never  Smoker  . Smokeless tobacco: Never Used  Substance Use Topics  . Alcohol use: No    Alcohol/week: 0.0 standard drinks    Subjective:  Accompanied by caregiver today;  Has been seen her and at [facility] twice in the past 2 weeks with concerns for swelling in her legs/ elevated blood sugars; Was given Lasix to take 40 mg qd x 3 days and then decrease to 20 mg daily; Did not understand that she was due to follow-up for repeat labs 1 week after that appointment; Went to the [facility] 2 days ago with concerns after 2 falls in the past few days/ elevated blood sugars and episodes of dizziness; had [facility] done which did not show any type of infection;  No labs were done at [facility] but that provider was concerned about dosage of Norco; pain management provider recently increased dosage from 5 to 10 mg; Does state that blood sugars appear to have normalized in the past 24 hours- this am sugar was at 124.     Objective:  Vitals:   12/07/19 1346  BP: 136/70  Temp: 97.9 F (36.6 C)  TempSrc: Oral  Weight: 144 lb (65.3 kg)  Height: $Remove'4\' 11"'tZcgKMg$  (1.499 m)    General: Well developed, well nourished, in no acute distress  Skin : Warm and dry.  Head: Normocephalic and atraumatic  Eyes: Sclera and conjunctiva clear; pupils round and reactive to light; extraocular movements intact  Ears: External normal; canals clear; tympanic membranes normal  Oropharynx: Pink, supple. No suspicious lesions  Neck: Supple without thyromegaly, adenopathy  Lungs: Respirations unlabored; clear to auscultation bilaterally without wheeze, rales, rhonchi  CVS exam: normal rate and regular rhythm.  Extremities: bilateral pedal edema, cyanosis, clubbing  Vessels: Symmetric bilaterally  Neurologic: Alert and oriented; speech intact; face symmetrical; moves all extremities well; CNII-XII intact without focal deficit   Assessment:  1. Dizziness   2. Elevated brain natriuretic peptide (BNP) level   3. Bilateral leg edema   4. Diastolic CHF, chronic  (HCC)     Plan:  1. Related  to recent medication changes; will update head CT; per patient, feeling better today; she will also discuss dosage of Norco with her pain management provider; 2. Check BNP today; 3. Repeat CBC,CMP today; Okay to use Lasix 20 mg qd prn; with current swelling- she understands to use daily until swelling resolves and then can use prn; will also do short term Bactrim to treat for possible infection; 4. Patient notes she has never been told she has CHF even though it is documented in her chart; will refer to cardiology for further evaluation;  Plan for flu shot at later date;    This visit occurred during the SARS-CoV-2 public health emergency.  Safety protocols were in place, including screening questions prior to the visit, additional usage of staff PPE, and extensive cleaning of exam room while observing appropriate contact time as indicated for disinfecting solutions.     No follow-ups on file.  Orders Placed This Encounter  Procedures  . CT Head Wo Contrast    Standing Status:   Future    Standing Expiration Date:   12/06/2020    Order Specific Question:   Preferred imaging location?    Answer:   [id number]    Order Specific Question:   Radiology Contrast Protocol - do NOT remove file path    Answer:   [email or web address]  . CBC with Differential/Platelet    Standing Status:   Future    Number of Occurrences:   1    Standing Expiration Date:   12/06/2020  . Comp Met (CMET)    Standing Status:   Future    Number of Occurrences:   1    Standing Expiration Date:   12/06/2020  . B Nat Peptide    Standing Status:   Future    Number of Occurrences:   1    Standing Expiration Date:   12/06/2020  . Ambulatory referral to Cardiology    Referral Priority:   Routine    Referral Type:   Consultation    Referral Reason:   Specialty Services Required    Requested Specialty:   Cardiology    Number of Visits Requested:   1     Requested Prescriptions   Signed Prescriptions Disp Refills  . sulfamethoxazole-trimethoprim (BACTRIM DS) 800-160 MG tablet 10 tablet 0    Sig: Take 1 tablet by mouth 2 (two) times daily.

## 2019-12-08 LAB — CBC WITH DIFFERENTIAL/PLATELET
Absolute Monocytes: 780 cells/uL (ref 200–950)
Basophils Absolute: 57 cells/uL (ref 0–200)
Basophils Relative: 0.5 %
Eosinophils Absolute: 678 cells/uL — ABNORMAL HIGH (ref 15–500)
Eosinophils Relative: 6 %
HCT: 34.7 % — ABNORMAL LOW (ref 35.0–45.0)
Hemoglobin: 11.5 g/dL — ABNORMAL LOW (ref 11.7–15.5)
Lymphs Abs: 4769 cells/uL — ABNORMAL HIGH (ref 850–3900)
MCH: 34.3 pg — ABNORMAL HIGH (ref 27.0–33.0)
MCHC: 33.1 g/dL (ref 32.0–36.0)
MCV: 103.6 fL — ABNORMAL HIGH (ref 80.0–100.0)
MPV: 8.8 fL (ref 7.5–12.5)
Monocytes Relative: 6.9 %
Neutro Abs: 5017 cells/uL (ref 1500–7800)
Neutrophils Relative %: 44.4 %
Platelets: 318 10*3/uL (ref 140–400)
RBC: 3.35 10*6/uL — ABNORMAL LOW (ref 3.80–5.10)
RDW: 12 % (ref 11.0–15.0)
Total Lymphocyte: 42.2 %
WBC: 11.3 10*3/uL — ABNORMAL HIGH (ref 3.8–10.8)

## 2019-12-08 LAB — COMPREHENSIVE METABOLIC PANEL
AG Ratio: 1.7 (calc) (ref 1.0–2.5)
ALT: 22 U/L (ref 6–29)
AST: 17 U/L (ref 10–35)
Albumin: 3.8 g/dL (ref 3.6–5.1)
Alkaline phosphatase (APISO): 40 U/L (ref 37–153)
BUN/Creatinine Ratio: 23 (calc) — ABNORMAL HIGH (ref 6–22)
BUN: 22 mg/dL (ref 7–25)
CO2: 25 mmol/L (ref 20–32)
Calcium: 8.8 mg/dL (ref 8.6–10.4)
Chloride: 100 mmol/L (ref 98–110)
Creat: 0.94 mg/dL — ABNORMAL HIGH (ref 0.60–0.88)
Globulin: 2.3 g/dL (calc) (ref 1.9–3.7)
Glucose, Bld: 128 mg/dL — ABNORMAL HIGH (ref 65–99)
Potassium: 4.3 mmol/L (ref 3.5–5.3)
Sodium: 137 mmol/L (ref 135–146)
Total Bilirubin: 0.3 mg/dL (ref 0.2–1.2)
Total Protein: 6.1 g/dL (ref 6.1–8.1)

## 2019-12-08 LAB — BRAIN NATRIURETIC PEPTIDE: Brain Natriuretic Peptide: 31 pg/mL (ref <100)

## 2019-12-09 NOTE — Progress Notes (Signed)
Subjective: 84 year old female presents to the office today for follow-up evaluation of right second toe pre-ulcerative callus as well as thick, elongated toenails that she cannot trim her self.  Denies any drainage or pus.  She still gets some chronic swelling to the right second toe but denies any skin breakdown.  Intermittent discomfort of the callus some.  No other concerns today. Denies any fevers, chills, nausea, vomiting.  No calf pain, chest pain, shortness of breath.  Objective: AAO x3, NAD-presents with daughter DP/PT pulses palpable bilaterally, CRT less than 3 seconds Hammertoe contracture present of the right second toe resulting in a hyperkeratotic lesion on the dorsal PIPJ.  Upon debridement there is no underlying ulceration drainage or any signs of infection.  Mild chronic edema to the toe without any fluctuation crepitation.  There is no malodor Nails are hypertrophic, dystrophic, brittle, discolored, elongated 10. No surrounding redness or drainage. Tenderness nails 1-5 bilaterally. No pain with calf compression, swelling, warmth, erythema  Assessment: Right second toe preulcerative callus; symptomatic onychomycosis  Plan: -All treatment options discussed with the patient including all alternatives, risks, complications.  -Debrided hyperkeratotic tissue with any complications or bleeding.  Continue offloading surgical shoe for now to continue to offload the hammertoe if needed. -Debrided the nails x10 without any complications or bleeding.  Return in about 6 weeks (around 01/17/2020).  Trula Slade DPM

## 2019-12-10 ENCOUNTER — Telehealth: Payer: Self-pay | Admitting: Internal Medicine

## 2019-12-10 NOTE — Telephone Encounter (Signed)
To clarify regarding the Lasix-  As we discussed at her OV, she can use the Lasix daily as needed for swelling; we discussed that she can use it for 2-3 days at a time and then take a break if the swelling improves.  We are down 3 providers at this office this week. I would see if she can be seen at a different [place] office or she can go back to the [facility] provider at [place] that she has the relationship with.

## 2019-12-10 NOTE — Telephone Encounter (Signed)
Patient is calling to follow up and would like a call back.

## 2019-12-10 NOTE — Telephone Encounter (Signed)
° ° °  Patient calling to discuss lab results. Patient would also like to know if she needs to continue taking furosemide (LASIX) 20 MG tablet, she is reporting a blister has developed on both feet. Last seen 9/17 by L.Valere Dross

## 2019-12-11 ENCOUNTER — Other Ambulatory Visit (HOSPITAL_COMMUNITY): Payer: Medicare Other

## 2019-12-11 NOTE — Telephone Encounter (Signed)
Spoke with Kayla Thompson explained note left by the provider Jodi Mourning. Patient understood.

## 2019-12-13 ENCOUNTER — Other Ambulatory Visit: Payer: Self-pay | Admitting: Internal Medicine

## 2019-12-13 DIAGNOSIS — E039 Hypothyroidism, unspecified: Secondary | ICD-10-CM

## 2019-12-14 ENCOUNTER — Other Ambulatory Visit: Payer: Self-pay

## 2019-12-14 ENCOUNTER — Encounter: Payer: Self-pay | Admitting: Internal Medicine

## 2019-12-14 ENCOUNTER — Ambulatory Visit (INDEPENDENT_AMBULATORY_CARE_PROVIDER_SITE_OTHER): Payer: Medicare Other | Admitting: Internal Medicine

## 2019-12-14 VITALS — BP 130/78 | HR 79 | Ht 59.0 in | Wt 139.0 lb

## 2019-12-14 DIAGNOSIS — I10 Essential (primary) hypertension: Secondary | ICD-10-CM | POA: Diagnosis not present

## 2019-12-14 DIAGNOSIS — E213 Hyperparathyroidism, unspecified: Secondary | ICD-10-CM

## 2019-12-14 DIAGNOSIS — Z79899 Other long term (current) drug therapy: Secondary | ICD-10-CM | POA: Diagnosis not present

## 2019-12-14 DIAGNOSIS — Z794 Long term (current) use of insulin: Secondary | ICD-10-CM

## 2019-12-14 DIAGNOSIS — E1165 Type 2 diabetes mellitus with hyperglycemia: Secondary | ICD-10-CM

## 2019-12-14 DIAGNOSIS — M818 Other osteoporosis without current pathological fracture: Secondary | ICD-10-CM

## 2019-12-14 DIAGNOSIS — E559 Vitamin D deficiency, unspecified: Secondary | ICD-10-CM | POA: Diagnosis not present

## 2019-12-14 DIAGNOSIS — M542 Cervicalgia: Secondary | ICD-10-CM | POA: Diagnosis not present

## 2019-12-14 NOTE — Progress Notes (Signed)
Patient ID: Kayla Thompson, female   DOB: June 11, 1935, 84 y.o.   MRN: [id number]  This visit occurred during the SARS-CoV-2 public health emergency.  Safety protocols were in place, including screening questions prior to the visit, additional usage of staff PPE, and extensive cleaning of exam room while observing appropriate contact time as indicated for disinfecting solutions.   HPI  Kayla Thompson is a 84 y.o.-year-old female, initially referred by her OB/GYN doctor, Dr. Uvaldo Rising, for evaluation for hyperparathyroidism + normo/hypo-calcemia, vitamin D deficiency,  and DM2, dx 2010, insulin-dependent since 2010, controlled, w/o long-term complications.  Last visit 6 months ago.  Before last visit, she was admitted with UTI and encephalopathy 01/2019.  She is currently on Bactrim x 1 week for B cellulitis -improving. She also has edema B, now improved after starting a  diuretic.  Reviewed and addended history: Pt was dx with hyperparathyroidism in 08/2014.  Reviewed pertinent labs: Lab Results  Component Value Date   PTH 37 06/09/2015   PTH Comment 06/09/2015   PTH 73 (H) 10/28/2014   PTH Comment 10/28/2014   PTH 190 (H) 09/10/2014   PTH 243 (H) 08/23/2014   CALCIUM 8.8 12/07/2019   CALCIUM 8.9 11/27/2019   CALCIUM 9.1 11/20/2019   CALCIUM 8.8 10/16/2019   CALCIUM 8.5 (L) 01/30/2019   CALCIUM 8.5 (L) 01/29/2019   CALCIUM 8.8 (L) 01/28/2019   CALCIUM 8.2 (L) 01/27/2019   CALCIUM 8.7 (L) 01/25/2019   CALCIUM 9.7 10/12/2018   Her osteoporosis was diagnosed in 2007.  She had few falls.  Reviewed previous DXA scan report: Date L1-L2 (L3 and L4 excluded) T score FN T score  01/05/2018  +0.4  LFN: -2.8  07/25/2014   -0.1  LFN: -2.7  Right hip could not be analyzed due to previous instrumentation.  She has a history of multiple fractures in: - 09/23/1990: pelvic fx - 02/2005: R hip fx - rod - 05/2006: rib fx - 12/2008: 3 R ankle fx - in [place] - plate and screws -  78/2956: wrist fx's 01/2012: steroid inj's R knee - 07/2014: forearm fx and nasal bone fx - 09/2019: Chest x-ray: Age-indeterminate fractures of the third and fourth ribs  She started Prolia in 2017.  She is tolerating this well.  Last dose 10/08/2019 - per PCP.  No h/o kidney stones.  No CKD. Last BUN/Cr: Lab Results  Component Value Date   BUN 22 12/07/2019   CREATININE 0.94 (H) 12/07/2019   She is not on HCTZ.  Patient continues on Caltrate 600 mg 2x a day and vitamin D 4000 units daily.  No FH of hypercalcemia, pituitary tumors, thyroid cancer, or osteoporosis.   Vitamin D level: Normal: Lab Results  Component Value Date   VD25OH 72.23 10/12/2018   VD25OH 81.19 06/09/2015   VD25OH 65.60 01/28/2015   VD25OH 48 08/23/2014   A 24-hour urine calcium was normal: Component     Latest Ref Rng 03/31/2015  Calcium, Ur     Not estab mg/dL 9  Calcium, 24 hour urine     35 - 250 mg/24 h 108  Creatinine, Urine     20 - 320 mg/dL 78  Creatinine, 24H Ur     0.63 - 2.50 g/24 h 0.94   DM2: - dx 2010 >> she started insulin and diagnosis.  Reviewed HbA1c levels: Lab Results  Component Value Date   HGBA1C 6.3 (H) 10/16/2019   HGBA1C 6.8 (H) 01/08/2019   HGBA1C 5.9 (A) 10/12/2018  She is on: - Lantus 30 >> 25 >> 20 >> 15 units in am Previously on: Humalog 4-6 >> Novolog 4 units before breakfast only >> stopped 09/2018  She checks sugars once a day: - am: 99-119 >> 109-119 >> 91-142 >> 84-105, 142 - after lunch: 120 >> 116-154 >> n/c  - after dinner: 132 >> 70 >> n/c - bedtime: 142, 228 >> 97, 116 >> 60 >> n/c >> <160 >> 84-190s Lowest: 92 >> 37 (?) on BMP 04/2017 >> 81 >> 91 >> 84 (maybe one in  70s) Highest: 189 >> 147 with a steroid inj's >> 170 with steroid inj (shoulder and knee) >> 194 >> 190s.  -+ HL; latest lipids:  Lab Results  Component Value Date   CHOL 132 10/16/2019   HDL 55 10/16/2019   LDLCALC 57 10/16/2019   TRIG 114 10/16/2019   CHOLHDL 2.4  10/16/2019  On Crestor 10.  + Mild CKD;. Latest BUN/Cr: Lab Results  Component Value Date   BUN 22 12/07/2019   Lab Results  Component Value Date   CREATININE 0.94 (H) 12/07/2019   Last dilated eye exam: 10/10/2019: No DR; Dr Gershon Crane.  She has a history of chalazion, has a history of cataract surgery.  She also has a history of Ovarian cancer - 1996, urinary incontinence, HTN.  She has a history of hypothyroidism, which is uncontrolled: Lab Results  Component Value Date   TSH 0.76 10/16/2019   TSH 0.110 (L) 01/27/2019   TSH 0.85 10/12/2018   TSH 0.23 (L) 12/01/2016   TSH 0.61 07/15/2016   TSH 0.61 10/21/2015   TSH 0.36 03/03/2015   TSH 0.591 08/02/2014   In the past, she was very reticent to decrease the dose of levothyroxine due to fear of hair loss.  At last visit she was on levothyroxine 50 mcg daily.  In 01/2019, her TSH was suppressed and I again advised her to decrease the dose to 25 g daily.  She did not do so.  Pt takes the levothyroxine: - in am - fasting - at least 30 min from b'fast (moved from 10 min later - at last visit) - + Ca, Fe, MVI, PPIs (did not move these at night as advised at last visit) - not on Biotin  Pt denies: - feeling nodules in neck - hoarseness - choking - SOB with lying down But does have chronic dysphagia  ROS: Constitutional: no weight gain/no weight loss, no fatigue, no subjective hyperthermia, no subjective hypothermia Eyes: no blurry vision, no xerophthalmia ENT: no sore throat, + see HPI Cardiovascular: no CP/no SOB/no palpitations/+ leg swelling Respiratory: no cough/no SOB/no wheezing Gastrointestinal: no N/no V/no D/no C/+ acid reflux Musculoskeletal: + muscle aches/+ joint aches Skin: + rash bilateral lower legs, no hair loss Neurological: + tremors/no numbness/no tingling/no dizziness  I reviewed pt's medications, allergies, PMH, social hx, family hx, and changes were documented in the history of present illness.  Otherwise, unchanged from my initial visit note.  Past Medical History:  Diagnosis Date  . Anxiety   . Arthritis   . Asthma   . Cataracts, bilateral    removed by surgery  . COPD (chronic obstructive pulmonary disease) ([place])   . Depression   . Diabetes mellitus without complication ([place])    type 2  . Dyspnea    with exertion  . Fibromyalgia   . GERD (gastroesophageal reflux disease)   . History of chemotherapy   . History of fractured pelvis   .  Hypertension   . Hypothyroidism   . Osteoporosis 12/2017   T score -2.8 stable from prior DEXA  . Osteoporosis   . Ovarian cancer ([place])   . Pneumonia    several times   Past Surgical History:  Procedure Laterality Date  . ABDOMINAL HYSTERECTOMY  1996  . ANKLE FRACTURE SURGERY     plate and screws  . BIOPSY  02/05/2019   Procedure: BIOPSY;  Surgeon: Rush Landmark Telford Nab., MD;  Location: [facility];  Service: Gastroenterology;;  . BLADDER SUSPENSION    . CATARACT EXTRACTION    . COLONOSCOPY    . ESOPHAGEAL BRUSHING  02/05/2019   Procedure: ESOPHAGEAL BRUSHING;  Surgeon: Rush Landmark Telford Nab., MD;  Location: [facility];  Service: Gastroenterology;;  . ESOPHAGOGASTRODUODENOSCOPY (EGD) WITH PROPOFOL N/A 02/05/2019   Procedure: ESOPHAGOGASTRODUODENOSCOPY (EGD) WITH PROPOFOL with dialtion;  Surgeon: Irving Copas., MD;  Location: [place];  Service: Gastroenterology;  Laterality: N/A;  . EYE SURGERY     cataracts removed-bilateral  . hip surgey    . knee surgey    . LUNG SURGERY    . OOPHORECTOMY     BSO  . SAVORY DILATION N/A 02/05/2019   Procedure: SAVORY DILATION;  Surgeon: Rush Landmark Telford Nab., MD;  Location: [facility];  Service: Gastroenterology;  Laterality: N/A;  . TONSILLECTOMY    . UPPER GI ENDOSCOPY     History   Social History  . Marital Status: Widowed    Spouse Name: N/A  . Number of Children: 2 (1 adopted)   Occupational History  . retired   Social History Main Topics  .  Smoking status: Never Smoker   . Smokeless tobacco: Never Used  . Alcohol Use: No  . Drug Use: No   Current Outpatient Medications on File Prior to Visit  Medication Sig Dispense Refill  . albuterol (PROAIR HFA) 108 (90 Base) MCG/ACT inhaler Inhale 2 puffs into the lungs every 6 (six) hours as needed for wheezing or shortness of breath. 18 g 12  . amitriptyline (ELAVIL) 10 MG tablet Take 1 tablet (10 mg total) by mouth at bedtime. 90 tablet 3  . baclofen (LIORESAL) 10 MG tablet TAKE 0.5-1 TABLET BY MOUTH TWICE DAILY AS NEEDED FOR MUSCLE SPASMS OR PAIN 180 tablet 1  . BD PEN NEEDLE NANO 2ND GEN 32G X 4 MM MISC USE AS DIRECTED 4 TIMES A DAY 100 each 5  . busPIRone (BUSPAR) 5 MG tablet TAKE 1 TO 2 TABLETS BY MOUTH TWICE A DAY (Patient taking differently: Take 5-10 mg by mouth daily as needed (anxiety). ) 360 tablet 1  . calcium carbonate (OSCAL) 1500 (600 Ca) MG TABS tablet Take 1,500 mg by mouth 2 (two) times daily with a meal.     . carvedilol (COREG) 3.125 MG tablet Take 1 tablet (3.125 mg total) by mouth 2 (two) times daily with a meal. 180 tablet 1  . Cholecalciferol (VITAMIN D3) 2000 units TABS Take 2 tablets by mouth daily.     Marland Kitchen Dextromethorphan-Guaifenesin (MUCINEX DM MAXIMUM STRENGTH) 60-1200 MG TB12 Take 1 tablet by mouth 2 (two) times daily. Reported on 06/23/2015    . DULoxetine (CYMBALTA) 30 MG capsule Take 1 capsule (30 mg total) by mouth daily. Annual appt due in Oct must see provider for future refills 90 capsule 1  . DULoxetine (CYMBALTA) 60 MG capsule Take 1 capsule (60 mg total) by mouth daily. 90 capsule 3  . ferrous gluconate (FERGON) 324 MG tablet TAKE 1 TABLET BY MOUTH EVERY DAY  WITH BREAKFAST 90 tablet 2  . Fluticasone-Umeclidin-Vilant (TRELEGY ELLIPTA) 100-62.5-25 MCG/INH AEPB Inhale 1 puff into the lungs daily. Rinse mouth 60 each 11  . furosemide (LASIX) 20 MG tablet Take 40 mg daily x 3 days then 20 mg daily 30 tablet 3  . glucose blood (FREESTYLE TEST STRIPS) test strip  Use to test blood sugar 3 times daily. Dx: E11.65 100 each 3  . HYDROcodone-acetaminophen (NORCO) 10-325 MG tablet Take 1 tablet by mouth every 4 (four) hours as needed.    Marland Kitchen ipratropium-albuterol (DUONEB) 0.5-2.5 (3) MG/3ML SOLN USE 1 VIAL IN NEBULIZER 3 TIMES DAILY 90 mL 12  . Lancets (FREESTYLE) lancets Use to test blood sugar 3 times daily. Dx: E11.65 100 each 3  . LANTUS SOLOSTAR 100 UNIT/ML Solostar Pen INJECT 20 UNITS INTO THE SKIN EVERY MORNING. (Patient taking differently: 15 Units. ) 18 mL 8  . levothyroxine (SYNTHROID) 50 MCG tablet TAKE 1 TABLET BY MOUTH EVERY DAY BEFORE BREAKFAST 90 tablet 1  . lidocaine (LIDODERM) 5 % SMARTSIG:1-3 Patch(s) Topical Every 12 Hours PRN    . magnesium oxide (MAG-OX) 400 MG tablet TAKE 1 TABLET BY MOUTH EVERY DAY 90 tablet 1  . methenamine (MANDELAMINE) 1 g tablet SMARTSIG:1 Tablet(s) By Mouth Every 12 Hours    . Misc Natural Products (TART CHERRY ADVANCED PO) Take by mouth. Once daily    . montelukast (SINGULAIR) 10 MG tablet TAKE 1 TABLET BY MOUTH EVERY DAY 90 tablet 2  . Multiple Vitamins-Minerals (CENTRUM ADULTS PO) Take 1 tablet by mouth daily.     Marland Kitchen omeprazole (PRILOSEC) 20 MG capsule TAKE 1 CAPSULE BY MOUTH 2 TIMES A DAY BEFORE A MEAL 180 capsule 1  . predniSONE (DELTASONE) 1 MG tablet Take 2 tablets (2 mg total) by mouth daily. 60 tablet 1  . rosuvastatin (CRESTOR) 10 MG tablet Take 1 tablet (10 mg total) by mouth every evening. 90 tablet 1  . sulfamethoxazole-trimethoprim (BACTRIM DS) 800-160 MG tablet Take 1 tablet by mouth 2 (two) times daily. 10 tablet 0  . Turmeric (QC TUMERIC COMPLEX PO) Take by mouth.    Marland Kitchen UNABLE TO FIND Take 1,000 mg by mouth daily. Med Name: Tumeric    . UNABLE TO FIND Take 1,000 mg by mouth daily. Med Name: [place]    . vitamin B-12 (CYANOCOBALAMIN) 250 MCG tablet Take 250 mcg by mouth See admin instructions. Twice weekly     No current facility-administered medications on file prior to visit.   Allergies   Allergen Reactions  . Latex Rash  . Penicillins Rash    Has patient had a PCN reaction causing immediate rash, facial/tongue/throat swelling, SOB or lightheadedness with hypotension: No Has patient had a PCN reaction causing severe rash involving mucus membranes or skin necrosis: No Has patient had a PCN reaction that required hospitalization: No Has patient had a PCN reaction occurring within the last 10 years: No If all of the above answers are "NO", then may proceed with Cephalosporin use.    Family History  Problem Relation Age of Onset  . Lung cancer Mother   . CAD Father   . Diabetes Father   . Lung cancer Maternal Aunt   . Diabetes Paternal Aunt   . Diabetes Paternal Uncle   . Diabetes Paternal Grandmother   . Diabetes Paternal Grandfather   . Colon cancer Neg Hx   . Esophageal cancer Neg Hx   . Inflammatory bowel disease Neg Hx   . Liver disease Neg Hx   .  Pancreatic cancer Neg Hx   . Rectal cancer Neg Hx   . Stomach cancer Neg Hx    PE: BP 130/78 (BP Location: Right Arm, Patient Position: Sitting, Cuff Size: Normal)   Pulse 79   Ht 4\' 11"  (1.499 m)   Wt 139 lb (63 kg)   LMP  (LMP Unknown)   BMI 28.07 kg/m  Body mass index is 28.07 kg/m. Wt Readings from Last 3 Encounters:  12/14/19 139 lb (63 kg)  12/07/19 144 lb (65.3 kg)  11/20/19 145 lb (65.8 kg)   Constitutional: normal weight, in NAD Eyes: PERRLA, EOMI, no exophthalmos ENT: moist mucous membranes, no thyromegaly, no cervical lymphadenopathy Cardiovascular: RRR, No MRG, + B LE edema, erythema + small bulla (1.5 cm on L shin) Respiratory: CTA B Gastrointestinal: abdomen soft, NT, ND, BS+ Musculoskeletal: + finger deformities ([place] and Bouchard nodules), strength intact in all 4 Skin: moist, warm, no rashes Neurological: + tremor with outstretched hands, DTR normal in all 4  Assessment: 1. H/o Hyperparathyroidism - recent normocalcemia  2. Vitamin D def  3. DM2, insulin-dependent, controlled,  without complications  4. Low TSH  5.  Osteoporosis  Plan: 1. HPTH and 2. Vit D def -Patient with history of hyperparathyroidism in the setting of low calcium, which normalized afterwards (however, latest calcium level was low).  She continues on Prolia injections, calcium, and vitamin D 4000 units daily. -Latest vitamin D level was normal in 09/2018 -We need to recheck a vitamin D level today.  3. DM2 - Patient with longstanding, previously uncontrolled type 2 diabetes, on basal insulin only.  We were able to stop NovoLog as her sugars improved and her HbA1c also dropped.  We discussed again at last visit about checking sugars at different times of the day and writing them down.  At that time, sugars were excellent so we did not change her regimen -She had a recent HbA1c on 10/16/2019 and this was 6.3%, decreased from 6.7%. - I advised her to  Patient Instructions  Please continue: - Lantus 15 units in am.  Continue levothyroxine 50 mcg daily.  Take the thyroid hormone every day, with water, at least 30 minutes before breakfast.  Continue vitamin D 4000 units daily.  You need a bone density test after 01/06/2020.  Check sugars 1-2x a day, rotating check times.  Please come back for a follow-up appointment in 6 months.  - advised to check sugars at different times of the day - 1-2x a day, rotating check times - advised for yearly eye exams >> she is UTD - return to clinic in 6  months  4.  Hypothyroidism -She has a history of hypothyroidism and is on levothyroxine 50 mcg daily -I have repeatedly advised her to decrease the dose of levothyroxine from 50 to 25 mcg daily but she was reticent to decrease the dose of her levothyroxine for fear of hair loss.  We did discuss about the cardiovascular risks of thyrotoxicosis. -At last visit, she was taking multivitamins, PPIs, and calcium along with levothyroxine.  I advised her to monitor these at night.  She forgot.  She is still  taking multivitamins, iron, PPIs, and calcium along with levothyroxine in the morning.  Since she is taking an excessive dose of levothyroxine, I advised her to keep these in the morning to reduce the absorption of levothyroxine. -We reviewed latest TSH from 10/16/2019 and this was normal. -For now, we will continue the same dose of levothyroxine  5.  Osteoporosis -  latest DXA scan results were reviewed from 12/2017: Left femoral neck T score was -2.8, only slightly lower compared to 2016: -2.6.  A right femoral neck could not be analyzed because of hardware. -She continues on Prolia -in PCP's office.  No hip/thigh/jaw pain. -However, she had recent chest x-ray which showed age-indeterminate rib fractures of the third and fourth ribs; she also had a left hip x-ray that showed chronic fracture deformities in the bilateral superior and inferior pubic rami (these fractures were previously known) -she needs another bone density scan.  This would be due after 01/06/2020. Per PCP.  Component     Latest Ref Rng & Units 12/14/2019  Vitamin D, 25-Hydroxy     30.0 - 100.0 ng/mL 99.3  Vitamin D level is too high.  Will advise her to stop vitamin D supplements for 1 week and then restart at 2000 units daily.  Philemon Kingdom, MD PhD [facility] Endocrinology

## 2019-12-14 NOTE — Patient Instructions (Signed)
Please continue: - Lantus 15 units in am.  Continue levothyroxine 50 mcg daily.  Take the thyroid hormone every day, with water, at least 30 minutes before breakfast.  Continue vitamin D 4000 units daily.  You need a bone density test after 01/06/2020.  Check sugars 1-2x a day, rotating check times.  Please come back for a follow-up appointment in 6 months.

## 2019-12-15 LAB — VITAMIN D 25 HYDROXY (VIT D DEFICIENCY, FRACTURES): Vit D, 25-Hydroxy: 99.3 ng/mL (ref 30.0–100.0)

## 2019-12-17 ENCOUNTER — Other Ambulatory Visit: Payer: Self-pay | Admitting: Family

## 2019-12-17 ENCOUNTER — Telehealth (INDEPENDENT_AMBULATORY_CARE_PROVIDER_SITE_OTHER): Payer: Medicare Other | Admitting: Family

## 2019-12-17 DIAGNOSIS — Z20822 Contact with and (suspected) exposure to covid-19: Secondary | ICD-10-CM

## 2019-12-17 DIAGNOSIS — L03116 Cellulitis of left lower limb: Secondary | ICD-10-CM

## 2019-12-17 DIAGNOSIS — G8929 Other chronic pain: Secondary | ICD-10-CM

## 2019-12-17 DIAGNOSIS — M549 Dorsalgia, unspecified: Secondary | ICD-10-CM

## 2019-12-17 NOTE — Progress Notes (Signed)
Kayla Thompson is a 84 y.o. female with the following history as recorded in EpicCare:  Patient Active Problem List   Diagnosis Date Noted  . Bilateral leg edema 11/20/2019  . Pre-operative cardiovascular examination 10/18/2019  . UTI symptoms 05/29/2019  . Dysuria 05/11/2019  . Upper esophageal web 03/27/2019  . Iron deficiency anemia 03/27/2019  . Candida esophagitis ([place]) 02/14/2019  . Encounter for general adult medical examination with abnormal findings 01/09/2019  . Abnormal barium swallow 12/28/2018  . Throat clearing 12/28/2018  . History of colonic polyps 12/28/2018  . Dysphagia 12/28/2018  . Diabetic foot ulcer ([place]) 12/05/2018  . Rotator cuff arthropathy, left 11/16/2018  . Degenerative arthritis of left knee 11/16/2018  . Insomnia 09/26/2018  . Leukocytosis 10/17/2017  . Malnutrition of moderate degree 10/10/2017  . Memory changes 07/29/2017  . Granulomatous lung disease ([place]) 06/14/2017  . Tracheobronchomalacia 06/14/2017  . Dizziness 03/04/2017  . Exercise hypoxemia 07/12/2016  . Diastolic CHF, chronic ([place]) 12/25/2015  . GERD (gastroesophageal reflux disease) 12/25/2015  . Epidermal inclusion cyst 09/02/2015  . Type 2 diabetes mellitus with hyperglycemia, with long-term current use of insulin () 03/10/2015  . Muscle cramps 03/06/2015  . Cough 01/07/2015  . Bronchiectasis without acute exacerbation ([place]) 01/07/2015  . PMR (polymyalgia rheumatica) ([place]) 01/07/2015  . Osteoarthritis 01/07/2015  . Asthma, chronic 11/12/2014  . Anxiety state 11/12/2014  . Hyperparathyroidism ([place]) 10/28/2014  . Fibromyalgia 09/21/2014  . Personal history of ovarian cancer 08/23/2014  . Osteoporosis 08/23/2014  . Hypertension 08/02/2014  . HLD (hyperlipidemia) 08/02/2014  . Hypothyroidism 08/02/2014    Current Outpatient Medications  Medication Sig Dispense Refill  . albuterol (PROAIR HFA) 108 (90 Base) MCG/ACT inhaler Inhale 2 puffs into the lungs every 6 (six) hours as  needed for wheezing or shortness of breath. 18 g 12  . amitriptyline (ELAVIL) 10 MG tablet Take 1 tablet (10 mg total) by mouth at bedtime. 90 tablet 3  . baclofen (LIORESAL) 10 MG tablet TAKE 0.5-1 TABLET BY MOUTH TWICE DAILY AS NEEDED FOR MUSCLE SPASMS OR PAIN 180 tablet 1  . BD PEN NEEDLE NANO 2ND GEN 32G X 4 MM MISC USE AS DIRECTED 4 TIMES A DAY 100 each 5  . busPIRone (BUSPAR) 5 MG tablet TAKE 1 TO 2 TABLETS BY MOUTH TWICE A DAY (Patient taking differently: Take 5-10 mg by mouth daily as needed (anxiety). ) 360 tablet 1  . calcium carbonate (OSCAL) 1500 (600 Ca) MG TABS tablet Take 600 mg by mouth 2 (two) times daily with a meal.    . carvedilol (COREG) 3.125 MG tablet Take 1 tablet (3.125 mg total) by mouth 2 (two) times daily with a meal. 180 tablet 1  . Cholecalciferol (VITAMIN D3) 2000 units TABS Take 2 tablets by mouth daily.     Marland Kitchen Dextromethorphan-Guaifenesin (MUCINEX DM MAXIMUM STRENGTH) 60-1200 MG TB12 Take 1 tablet by mouth 2 (two) times daily. Reported on 06/23/2015    . doxycycline (VIBRA-TABS) 100 MG tablet Take 1 tablet (100 mg total) by mouth 2 (two) times daily. 20 tablet 0  . DULoxetine (CYMBALTA) 30 MG capsule Take 1 capsule (30 mg total) by mouth daily. Annual appt due in Oct must see provider for future refills 90 capsule 1  . DULoxetine (CYMBALTA) 60 MG capsule Take 1 capsule (60 mg total) by mouth daily. 90 capsule 3  . ferrous gluconate (FERGON) 324 MG tablet TAKE 1 TABLET BY MOUTH EVERY DAY WITH BREAKFAST 90 tablet 2  . Fluticasone-Umeclidin-Vilant (TRELEGY ELLIPTA) 100-62.5-25  MCG/INH AEPB Inhale 1 puff into the lungs daily. Rinse mouth 60 each 11  . furosemide (LASIX) 20 MG tablet Take 40 mg daily x 3 days then 20 mg daily 30 tablet 3  . glucose blood (FREESTYLE TEST STRIPS) test strip Use to test blood sugar 3 times daily. Dx: E11.65 100 each 3  . HYDROcodone-acetaminophen (NORCO) 10-325 MG tablet Take 1 tablet by mouth every 4 (four) hours as needed.    Marland Kitchen  ipratropium-albuterol (DUONEB) 0.5-2.5 (3) MG/3ML SOLN USE 1 VIAL IN NEBULIZER 3 TIMES DAILY 90 mL 12  . Lancets (FREESTYLE) lancets Use to test blood sugar 3 times daily. Dx: E11.65 100 each 3  . LANTUS SOLOSTAR 100 UNIT/ML Solostar Pen INJECT 20 UNITS INTO THE SKIN EVERY MORNING. (Patient taking differently: 15 Units. ) 18 mL 8  . levothyroxine (SYNTHROID) 50 MCG tablet TAKE 1 TABLET BY MOUTH EVERY DAY BEFORE BREAKFAST 90 tablet 1  . lidocaine (LIDODERM) 5 % SMARTSIG:1-3 Patch(s) Topical Every 12 Hours PRN    . magnesium oxide (MAG-OX) 400 MG tablet TAKE 1 TABLET BY MOUTH EVERY DAY 90 tablet 1  . methenamine (MANDELAMINE) 1 g tablet SMARTSIG:1 Tablet(s) By Mouth Every 12 Hours    . Misc Natural Products (TART CHERRY ADVANCED PO) Take by mouth. Once daily    . montelukast (SINGULAIR) 10 MG tablet TAKE 1 TABLET BY MOUTH EVERY DAY 90 tablet 2  . Multiple Vitamins-Minerals (CENTRUM ADULTS PO) Take 1 tablet by mouth daily.     Marland Kitchen omeprazole (PRILOSEC) 20 MG capsule TAKE 1 CAPSULE BY MOUTH 2 TIMES A DAY BEFORE A MEAL 180 capsule 1  . predniSONE (DELTASONE) 1 MG tablet Take 2 tablets (2 mg total) by mouth daily. 60 tablet 1  . rosuvastatin (CRESTOR) 10 MG tablet Take 1 tablet (10 mg total) by mouth every evening. 90 tablet 1  . Turmeric (QC TUMERIC COMPLEX PO) Take by mouth.    Marland Kitchen UNABLE TO FIND Take 1,000 mg by mouth daily. Med Name: Tumeric    . UNABLE TO FIND Take 1,000 mg by mouth daily. Med Name: [place]    . vitamin B-12 (CYANOCOBALAMIN) 250 MCG tablet Take 250 mcg by mouth See admin instructions. Twice weekly     No current facility-administered medications for this visit.    Allergies: Latex and Penicillins  Past Medical History:  Diagnosis Date  . Anxiety   . Arthritis   . Asthma   . Cataracts, bilateral    removed by surgery  . COPD (chronic obstructive pulmonary disease) ([place])   . Depression   . Diabetes mellitus without complication ([place])    type 2  . Dyspnea    with  exertion  . Fibromyalgia   . GERD (gastroesophageal reflux disease)   . History of chemotherapy   . History of fractured pelvis   . Hypertension   . Hypothyroidism   . Osteoporosis 12/2017   T score -2.8 stable from prior DEXA  . Osteoporosis   . Ovarian cancer ([place])   . Pneumonia    several times    Past Surgical History:  Procedure Laterality Date  . ABDOMINAL HYSTERECTOMY  1996  . ANKLE FRACTURE SURGERY     plate and screws  . BIOPSY  02/05/2019   Procedure: BIOPSY;  Surgeon: Rush Landmark Telford Nab., MD;  Location: [facility];  Service: Gastroenterology;;  . BLADDER SUSPENSION    . CATARACT EXTRACTION    . COLONOSCOPY    . ESOPHAGEAL BRUSHING  02/05/2019  Procedure: ESOPHAGEAL BRUSHING;  Surgeon: Rush Landmark Telford Nab., MD;  [facility];  Service: Gastroenterology;;  . ESOPHAGOGASTRODUODENOSCOPY (EGD) WITH PROPOFOL N/A 02/05/2019   Procedure: ESOPHAGOGASTRODUODENOSCOPY (EGD) WITH PROPOFOL with dialtion;  Surgeon: Rush Landmark Telford Nab., MD;  Location: [facility];  Service: Gastroenterology;  Laterality: N/A;  . EYE SURGERY     cataracts removed-bilateral  . hip surgey    . knee surgey    . LUNG SURGERY    . OOPHORECTOMY     BSO  . SAVORY DILATION N/A 02/05/2019   Procedure: SAVORY DILATION;  Surgeon: Rush Landmark Telford Nab., MD;  Location: [facility];  Service: Gastroenterology;  Laterality: N/A;  . TONSILLECTOMY    . UPPER GI ENDOSCOPY      Family History  Problem Relation Age of Onset  . Lung cancer Mother   . CAD Father   . Diabetes Father   . Lung cancer Maternal Aunt   . Diabetes Paternal Aunt   . Diabetes Paternal Uncle   . Diabetes Paternal Grandmother   . Diabetes Paternal Grandfather   . Colon cancer Neg Hx   . Esophageal cancer Neg Hx   . Inflammatory bowel disease Neg Hx   . Liver disease Neg Hx   . Pancreatic cancer Neg Hx   . Rectal cancer Neg Hx   . Stomach cancer Neg Hx     Social History   Tobacco Use  . Smoking  status: Never Smoker  . Smokeless tobacco: Never Used  Substance Use Topics  . Alcohol use: No    Alcohol/week: 0.0 standard drinks    Subjective:   I connected with [name] on 12/19/19 at  1:40 PM EDT by a video enabled telemedicine application and verified that I am speaking with the correct person using two identifiers.   I discussed the limitations of evaluation and management by telemedicine and the availability of in person appointments. The patient expressed understanding and agreed to proceed. Provider in office/ patient is at home; provider and patient are only 2 people on video call. Daughter helps provide history as well  Concerned about persisting redness/ blister on left lower leg; did not respond to initial treatment given 2 weeks ago;  Needs COVID test due to exposure;  Having extreme back pain- not using prescribed muscle relaxers; does have back specialist;     Objective:  There were no vitals filed for this visit.  General: Well developed, well nourished, in no acute distress  Skin : Warm and dry. Blister noted on left shin Lungs: Respirations unlabored;  Neurologic: Alert and oriented; speech intact; face symmetrical; moves all extremities well; CNII-XII intact without focal deficit   Assessment:  1. Cellulitis of left lower extremity   2. Close exposure to COVID-19 virus   3. Chronic back pain, unspecified back location, unspecified back pain laterality     Plan:  Rx for Doxycycline 100 mg bid x 10 days; COVID test ordered; Take muscle relaxers as prescribed; follow up with back specialist;   No follow-ups on file.  No orders of the defined types were placed in this encounter.   Requested Prescriptions   Signed Prescriptions Disp Refills  . doxycycline (VIBRA-TABS) 100 MG tablet 20 tablet 0    Sig: Take 1 tablet (100 mg total) by mouth 2 (two) times daily.

## 2019-12-18 DIAGNOSIS — Z20822 Contact with and (suspected) exposure to covid-19: Secondary | ICD-10-CM | POA: Diagnosis not present

## 2019-12-19 ENCOUNTER — Telehealth: Payer: Self-pay | Admitting: Internal Medicine

## 2019-12-19 ENCOUNTER — Encounter: Payer: Self-pay | Admitting: Internal Medicine

## 2019-12-19 LAB — NOVEL CORONAVIRUS, NAA: SARS-CoV-2, NAA: NOT DETECTED

## 2019-12-19 LAB — SARS-COV-2, NAA 2 DAY TAT

## 2019-12-19 MED ORDER — DOXYCYCLINE HYCLATE 100 MG PO TABS: 100.0000 mg | ORAL_TABLET | Freq: Two times a day (BID) | ORAL | 0 refills | Status: DC

## 2019-12-19 NOTE — Telephone Encounter (Signed)
Patient's daughter calling re: antiobiotic that was supposed to be called in on Monday after her virtual visit for the blisters on her mom's leg  [id number] [address], [place] Phone:  [phone]  Fax:  [phone]     Please call [name] at [phone]  Patient had my chart visit with Jodi Mourning on Monday 9.27.21

## 2019-12-20 ENCOUNTER — Ambulatory Visit: Admit: 2019-12-20 | Payer: Medicare Other | Admitting: Orthopedic Surgery

## 2019-12-20 SURGERY — ARTHROPLASTY, SHOULDER, TOTAL, REVERSE
Anesthesia: Choice | Site: Shoulder | Laterality: Left

## 2019-12-21 ENCOUNTER — Encounter: Payer: Self-pay | Admitting: Internal Medicine

## 2019-12-21 ENCOUNTER — Other Ambulatory Visit: Payer: Self-pay

## 2019-12-21 ENCOUNTER — Ambulatory Visit (INDEPENDENT_AMBULATORY_CARE_PROVIDER_SITE_OTHER): Payer: Medicare Other | Admitting: Internal Medicine

## 2019-12-21 VITALS — BP 124/68 | HR 92 | Ht 59.0 in | Wt 138.4 lb

## 2019-12-21 DIAGNOSIS — E119 Type 2 diabetes mellitus without complications: Secondary | ICD-10-CM | POA: Diagnosis not present

## 2019-12-21 DIAGNOSIS — Z79899 Other long term (current) drug therapy: Secondary | ICD-10-CM | POA: Diagnosis not present

## 2019-12-21 DIAGNOSIS — R600 Localized edema: Secondary | ICD-10-CM

## 2019-12-21 DIAGNOSIS — I10 Essential (primary) hypertension: Secondary | ICD-10-CM | POA: Diagnosis not present

## 2019-12-21 MED ORDER — FUROSEMIDE 40 MG PO TABS: 40.0000 mg | ORAL_TABLET | Freq: Every day | ORAL | 3 refills | Status: DC

## 2019-12-21 MED ORDER — FUROSEMIDE 20 MG PO TABS: 40.0000 mg | ORAL_TABLET | Freq: Every day | ORAL | 3 refills | Status: DC

## 2019-12-21 NOTE — Patient Instructions (Addendum)
Medication Instructions:  Your physician has recommended you make the following change in your medication: Increase your lasix to 40 mg a day  *If you need a refill on your cardiac medications before your next appointment, please call your pharmacy*   Lab Work: BMET and Mag level in 2 weeks .... non-fasting  If you have labs (blood work) drawn today and your tests are completely normal, you will receive your results only by: Marland Kitchen MyChart Message (if you have MyChart) OR . A paper copy in the mail If you have any lab test that is abnormal or we need to change your treatment, we will call you to review the results.   Testing/Procedures: Your physician has requested that you have an echocardiogram. Echocardiography is a painless test that uses sound waves to create images of your heart. It provides your doctor with information about the size and shape of your heart and how well your heart's chambers and valves are working. This procedure takes approximately one hour. There are no restrictions for this procedure.  Your physician has requested that you have a lower extremity arterial exercise duplex. During this test, exercise and ultrasound are used to evaluate arterial blood flow in the legs. Allow one hour for this exam. There are no restrictions or special instructions.    Follow-Up: At [facility], you and your health needs are our priority.  As part of our continuing mission to provide you with exceptional heart care, we have created designated Provider Care Teams.  These Care Teams include your primary Cardiologist (physician) and Advanced Practice Providers (APPs -  Physician Assistants and Nurse Practitioners) who all work together to provide you with the care you need, when you need it.  We recommend signing up for the patient portal called "MyChart".  Sign up information is provided on this After Visit Summary.  MyChart is used to connect with patients for Virtual Visits  (Telemedicine).  Patients are able to view lab/test results, encounter notes, upcoming appointments, etc.  Non-urgent messages can be sent to your provider as well.   To learn more about what you can do with MyChart, go to [email or web address].    Your next appointment:   6 month(s)  The format for your next appointment:   In Person  Provider:   Rudean Haskell, MD   Other Instructions

## 2019-12-21 NOTE — Progress Notes (Signed)
Cardiology Office Note:    Date:  12/21/2019   ID:  Kayla Thompson, DOB [birth year], MRN [id number]  PCP:  Marrian Salvage, [place]   Referring MD: Marrian Salvage,*   CC: leg swelling Consulted for the evaluation of leg swelling question of HFpEF at the behest of Marrian Salvage, FNP  History of Present Illness:    Kayla Thompson is a 84 y.o. female with a hx of HTN, HFpEF, Chromic Asthma with Granuloamtous Disease, Type II Diabetes Mellitus, Dysuria, who presents for for leg swelling  Patient and family note new leg swelling.  First noted that she has had swelling since one year.  Has had worsening of the swelling in Spring of 2021 and had increase in her legs.  Started lasix  until 10/12/19.  Patient has minimal activity due to instability with walking and multiple falls.  Patient notes that she has been on and off lasix without significant improvement in her leg swelling.  Has a small area of ulceration which impaired compression stockings use.  Patient is chronically short of breath.  In [place] had prior arterial evaluation but not in the last 6 years.  Notes some dyspnea or exertion with walking here today.No leg pain or chest pain.  No syncope.  Occasional has very pronounced which worsens her leg pain.  With lasix; leg swelling has improved.    Patient can lie flat without SOB but has difficulty getting up.  No PND or orthopnea.   Past Medical History:  Diagnosis Date  . Anxiety   . Arthritis   . Asthma   . Cataracts, bilateral    removed by surgery  . COPD (chronic obstructive pulmonary disease) ([place])   . Depression   . Diabetes mellitus without complication ([place])    type 2  . Dyspnea    with exertion  . Fibromyalgia   . GERD (gastroesophageal reflux disease)   . History of chemotherapy   . History of fractured pelvis   . Hypertension   . Hypothyroidism   . Osteoporosis 12/2017   T score -2.8 stable from prior DEXA  . Osteoporosis   . Ovarian  cancer ([place])   . Pneumonia    several times    Past Surgical History:  Procedure Laterality Date  . ABDOMINAL HYSTERECTOMY  1996  . ANKLE FRACTURE SURGERY     plate and screws  . BIOPSY  02/05/2019   Procedure: BIOPSY;  Surgeon: Rush Landmark Telford Nab., MD;  Location: [facility];  Service: Gastroenterology;;  . BLADDER SUSPENSION    . CATARACT EXTRACTION    . COLONOSCOPY    . ESOPHAGEAL BRUSHING  02/05/2019   Procedure: ESOPHAGEAL BRUSHING;  Surgeon: Rush Landmark Telford Nab., MD;  Location: [place] [facility];  Service: Gastroenterology;;  . ESOPHAGOGASTRODUODENOSCOPY (EGD) WITH PROPOFOL N/A 02/05/2019   Procedure: ESOPHAGOGASTRODUODENOSCOPY (EGD) WITH PROPOFOL with dialtion;  Surgeon: Irving Copas., MD;  Location: [facility];  Service: Gastroenterology;  Laterality: N/A;  . EYE SURGERY     cataracts removed-bilateral  . hip surgey    . knee surgey    . LUNG SURGERY    . OOPHORECTOMY     BSO  . SAVORY DILATION N/A 02/05/2019   Procedure: SAVORY DILATION;  Surgeon: Rush Landmark Telford Nab., MD;  Location: [facility];  Service: Gastroenterology;  Laterality: N/A;  . TONSILLECTOMY    . UPPER GI ENDOSCOPY     Current Medications: Current Meds  Medication Sig  . albuterol (PROAIR HFA) 108 (90 Base) MCG/ACT inhaler  Inhale 2 puffs into the lungs every 6 (six) hours as needed for wheezing or shortness of breath.  Marland Kitchen amitriptyline (ELAVIL) 10 MG tablet Take 1 tablet (10 mg total) by mouth at bedtime.  . baclofen (LIORESAL) 10 MG tablet TAKE 0.5-1 TABLET BY MOUTH TWICE DAILY AS NEEDED FOR MUSCLE SPASMS OR PAIN  . BD PEN NEEDLE NANO 2ND GEN 32G X 4 MM MISC USE AS DIRECTED 4 TIMES A DAY  . busPIRone (BUSPAR) 5 MG tablet TAKE 1 TO 2 TABLETS BY MOUTH TWICE A DAY  . calcium carbonate (OSCAL) 1500 (600 Ca) MG TABS tablet Take 600 mg by mouth 2 (two) times daily with a meal.  . carvedilol (COREG) 3.125 MG tablet Take 1 tablet (3.125 mg total) by mouth 2 (two) times daily with a meal.    . Cholecalciferol (VITAMIN D3) 2000 units TABS Take 2 tablets by mouth daily.   Marland Kitchen Dextromethorphan-Guaifenesin (MUCINEX DM MAXIMUM STRENGTH) 60-1200 MG TB12 Take 1 tablet by mouth 2 (two) times daily. Reported on 06/23/2015  . doxycycline (VIBRA-TABS) 100 MG tablet Take 1 tablet (100 mg total) by mouth 2 (two) times daily.  . DULoxetine (CYMBALTA) 30 MG capsule Take 1 capsule (30 mg total) by mouth daily. Annual appt due in Oct must see provider for future refills  . DULoxetine (CYMBALTA) 60 MG capsule Take 1 capsule (60 mg total) by mouth daily.  . ferrous gluconate (FERGON) 324 MG tablet TAKE 1 TABLET BY MOUTH EVERY DAY WITH BREAKFAST  . Fluticasone-Umeclidin-Vilant (TRELEGY ELLIPTA) 100-62.5-25 MCG/INH AEPB Inhale 1 puff into the lungs daily. Rinse mouth  . furosemide (LASIX) 20 MG tablet Take 20 mg by mouth daily.  Marland Kitchen glucose blood (FREESTYLE TEST STRIPS) test strip Use to test blood sugar 3 times daily. Dx: E11.65  . HYDROcodone-acetaminophen (NORCO) 10-325 MG tablet Take 1 tablet by mouth every 4 (four) hours as needed.  Marland Kitchen ipratropium-albuterol (DUONEB) 0.5-2.5 (3) MG/3ML SOLN USE 1 VIAL IN NEBULIZER 3 TIMES DAILY  . Lancets (FREESTYLE) lancets Use to test blood sugar 3 times daily. Dx: E11.65  . LANTUS SOLOSTAR 100 UNIT/ML Solostar Pen INJECT 20 UNITS INTO THE SKIN EVERY MORNING.  Marland Kitchen levothyroxine (SYNTHROID) 50 MCG tablet TAKE 1 TABLET BY MOUTH EVERY DAY BEFORE BREAKFAST  . lidocaine (LIDODERM) 5 % SMARTSIG:1-3 Patch(s) Topical Every 12 Hours PRN  . methenamine (MANDELAMINE) 1 g tablet SMARTSIG:1 Tablet(s) By Mouth Every 12 Hours  . Misc Natural Products (TART CHERRY ADVANCED PO) Take by mouth. Once daily  . montelukast (SINGULAIR) 10 MG tablet TAKE 1 TABLET BY MOUTH EVERY DAY  . Multiple Vitamins-Minerals (CENTRUM ADULTS PO) Take 1 tablet by mouth daily.   Marland Kitchen omeprazole (PRILOSEC) 20 MG capsule TAKE 1 CAPSULE BY MOUTH 2 TIMES A DAY BEFORE A MEAL  . predniSONE (DELTASONE) 1 MG tablet Take 2  tablets (2 mg total) by mouth daily.  . rosuvastatin (CRESTOR) 10 MG tablet Take 1 tablet (10 mg total) by mouth every evening.  Marland Kitchen UNABLE TO FIND Take 1,000 mg by mouth daily. Med Name: Tumeric  . vitamin B-12 (CYANOCOBALAMIN) 250 MCG tablet Take 250 mcg by mouth See admin instructions. Twice weekly    Allergies:   Latex and Penicillins   Social History   Socioeconomic History  . Marital status: Widowed    Spouse name: Not on file  . Number of children: 2  . Years of education: Not on file  . Highest education level: Not on file  Occupational History  . Not on file  Tobacco Use  . Smoking status: Never Smoker  . Smokeless tobacco: Never Used  Vaping Use  . Vaping Use: Never used  Substance and Sexual Activity  . Alcohol use: No    Alcohol/week: 0.0 standard drinks  . Drug use: No  . Sexual activity: Not Currently    Birth control/protection: Post-menopausal    Comment: declined sexual Hx questions  Other Topics Concern  . Not on file  Social History Narrative  . Not on file   Social Determinants of Health   Financial Resource Strain:   . Difficulty of Paying Living Expenses: Not on file  Food Insecurity:   . Worried About Charity fundraiser in the Last Year: Not on file  . Ran Out of Food in the Last Year: Not on file  Transportation Needs:   . Lack of Transportation (Medical): Not on file  . Lack of Transportation (Non-Medical): Not on file  Physical Activity:   . Days of Exercise per Week: Not on file  . Minutes of Exercise per Session: Not on file  Stress:   . Feeling of Stress : Not on file  Social Connections:   . Frequency of Communication with Friends and Family: Not on file  . Frequency of Social Gatherings with Friends and Family: Not on file  . Attends Religious Services: Not on file  . Active Member of Clubs or Organizations: Not on file  . Attends Archivist Meetings: Not on file  . Marital Status: Not on file    Family History: The  patient's family history includes CAD in her father; Diabetes in her father, paternal aunt, paternal grandfather, paternal grandmother, and paternal uncle; Lung cancer in her maternal aunt and mother. There is no history of Colon cancer, Esophageal cancer, Inflammatory bowel disease, Liver disease, Pancreatic cancer, Rectal cancer, or Stomach cancer.  ROS:   Please see the history of present illness.    All other systems reviewed and are negative.  EKGs/Labs/Other Studies Reviewed:    The following studies were reviewed today:  EKG:   10/16/19 SR, 79, No ST/T changes  Recent Labs: 01/28/2019: Magnesium 2.1 10/16/2019: TSH 0.76 11/20/2019: Pro B Natriuretic peptide (BNP) 58.0 12/07/2019: ALT 22; Brain Natriuretic Peptide 31; BUN 22; Creat 0.94; Hemoglobin 11.5; Platelets 318; Potassium 4.3; Sodium 137   Albumin 4.0 UA Protein negative of 10/12/19 TSH 0.76 with Free T4 1.1  Recent Lipid Panel    Component Value Date/Time   CHOL 132 10/16/2019 1506   TRIG 114 10/16/2019 1506   HDL 55 10/16/2019 1506   CHOLHDL 2.4 10/16/2019 1506   VLDL 26.0 10/12/2018 1517   LDLCALC 57 10/16/2019 1506   Echo 08/02/2014 Biventricular function is normal; Grade 1 hyporelaxation  Physical Exam:    VS:  BP 124/68   Pulse 92   Ht 4\' 11"  (1.499 m)   Wt 138 lb 6.4 oz (62.8 kg)   LMP  (LMP Unknown)   SpO2 95%   BMI 27.95 kg/m     Wt Readings from Last 3 Encounters:  12/21/19 138 lb 6.4 oz (62.8 kg)  12/14/19 139 lb (63 kg)  12/07/19 144 lb (65.3 kg)     GEN: Well nourished, well developed in no acute distress HEENT: Normal NECK: No JVD; No carotid bruits LYMPHATICS: No lymphadenopathy CARDIAC: RRR, no murmurs, rubs, gallops RESPIRATORY:  Clear to auscultation without rales, wheezing or rhonchi  ABDOMEN: Soft, non-tender, non-distended MUSCULOSKELETAL: 3+  edema; left legt blister without ulceration  SKIN: Warm  and dry NEUROLOGIC:  Alert and oriented x 3 PSYCHIATRIC:  Normal affect    ASSESSMENT:    1. Lower extremity edema   2. Diabetes mellitus with coincident hypertension (HCC)    PLAN:    In order of problems listed above:  Concern for HFpEF wiith Diabetes with HTN Lower extremity Edema (bilateral) with concern for venous insufficiency - will get echocardiogram - will get Arterial duplex bilateral and ABI  - will increase utilization of compression stockings - no evidence of hypoproteinemia, nephrotic syndrome, thyroid disease based on prior testing - on no iatrogenic medications - discussed fluid restriction - increase lasix to 40 mg daily; with 7-14 bmp magneisum - if no improvement by follow up visit, with no other cause, will send to vein/vascular specialist for evaluation   6 month follow up unless new symptoms or abnormal test results warranting change in plan  Would be reasonable for  APP Follow up   Medication Adjustments/Labs and Tests Ordered: Current medicines are reviewed at length with the patient today.  Concerns regarding medicines are outlined above.  No orders of the defined types were placed in this encounter.  No orders of the defined types were placed in this encounter.   Patient Instructions       Signed, Werner Lean, MD  12/21/2019 2:08 PM    [facility]

## 2019-12-24 ENCOUNTER — Ambulatory Visit
Admission: RE | Admit: 2019-12-24 | Discharge: 2019-12-24 | Disposition: A | Discharge status: Home / Self Care | Payer: Medicare Other | Source: Ambulatory Visit | Attending: Family | Admitting: Family

## 2019-12-24 DIAGNOSIS — I6782 Cerebral ischemia: Secondary | ICD-10-CM | POA: Diagnosis not present

## 2019-12-24 DIAGNOSIS — G319 Degenerative disease of nervous system, unspecified: Secondary | ICD-10-CM | POA: Diagnosis not present

## 2019-12-24 DIAGNOSIS — R42 Dizziness and giddiness: Secondary | ICD-10-CM | POA: Diagnosis not present

## 2019-12-25 ENCOUNTER — Telehealth: Payer: Self-pay | Admitting: Family

## 2019-12-25 DIAGNOSIS — M25562 Pain in left knee: Secondary | ICD-10-CM | POA: Diagnosis not present

## 2019-12-25 DIAGNOSIS — M79651 Pain in right thigh: Secondary | ICD-10-CM | POA: Diagnosis not present

## 2019-12-25 DIAGNOSIS — M1712 Unilateral primary osteoarthritis, left knee: Secondary | ICD-10-CM | POA: Diagnosis not present

## 2019-12-25 DIAGNOSIS — M25552 Pain in left hip: Secondary | ICD-10-CM | POA: Diagnosis not present

## 2019-12-25 NOTE — Telephone Encounter (Signed)
    Patient requesting refill for DULoxetine (CYMBALTA) 30 MG capsule DULoxetine (CYMBALTA) 60 MG capsule and busPIRone (BUSPAR) 5 MG tablet  Pharmacy [id number] [place], [place]

## 2019-12-26 ENCOUNTER — Other Ambulatory Visit: Payer: Self-pay | Admitting: Family

## 2019-12-26 MED ORDER — BUSPIRONE HCL 5 MG PO TABS: ORAL_TABLET | ORAL | 0 refills | Status: DC

## 2019-12-26 MED ORDER — DULOXETINE HCL 30 MG PO CPEP: 30.0000 mg | ORAL_CAPSULE | Freq: Every day | ORAL | 1 refills | Status: DC

## 2019-12-26 MED ORDER — DULOXETINE HCL 60 MG PO CPEP: 60.0000 mg | ORAL_CAPSULE | Freq: Every day | ORAL | 0 refills | Status: DC

## 2019-12-31 ENCOUNTER — Other Ambulatory Visit: Payer: Self-pay

## 2019-12-31 ENCOUNTER — Ambulatory Visit (INDEPENDENT_AMBULATORY_CARE_PROVIDER_SITE_OTHER): Payer: Medicare Other | Admitting: Obstetrics and Gynecology

## 2019-12-31 ENCOUNTER — Encounter: Payer: Self-pay | Admitting: Obstetrics and Gynecology

## 2019-12-31 VITALS — BP 124/76 | Ht 59.0 in | Wt 142.0 lb

## 2019-12-31 DIAGNOSIS — Z01419 Encounter for gynecological examination (general) (routine) without abnormal findings: Secondary | ICD-10-CM | POA: Diagnosis not present

## 2019-12-31 DIAGNOSIS — R198 Other specified symptoms and signs involving the digestive system and abdomen: Secondary | ICD-10-CM

## 2019-12-31 DIAGNOSIS — R10813 Right lower quadrant abdominal tenderness: Secondary | ICD-10-CM | POA: Diagnosis not present

## 2019-12-31 DIAGNOSIS — Z8543 Personal history of malignant neoplasm of ovary: Secondary | ICD-10-CM

## 2019-12-31 DIAGNOSIS — M810 Age-related osteoporosis without current pathological fracture: Secondary | ICD-10-CM

## 2019-12-31 DIAGNOSIS — N7689 Other specified inflammation of vagina and vulva: Secondary | ICD-10-CM

## 2019-12-31 NOTE — Progress Notes (Signed)
Kayla Thompson 04/08/1935 [id number]  SUBJECTIVE:  84 y.o. G1P1 female here for a breast and pelvic exam.  Having some left-sided vulvar irritation.  She does wear pads due to urinary incontinence but says she frequently has unchanged throughout the day so they are almost always dry. She has no other gynecologic concerns.  Current Outpatient Medications  Medication Sig Dispense Refill  . albuterol (PROAIR HFA) 108 (90 Base) MCG/ACT inhaler Inhale 2 puffs into the lungs every 6 (six) hours as needed for wheezing or shortness of breath. 18 g 12  . amitriptyline (ELAVIL) 10 MG tablet Take 1 tablet (10 mg total) by mouth at bedtime. 90 tablet 3  . baclofen (LIORESAL) 10 MG tablet TAKE 0.5-1 TABLET BY MOUTH TWICE DAILY AS NEEDED FOR MUSCLE SPASMS OR PAIN 180 tablet 1  . BD PEN NEEDLE NANO 2ND GEN 32G X 4 MM MISC USE AS DIRECTED 4 TIMES A DAY 100 each 5  . busPIRone (BUSPAR) 5 MG tablet TAKE 1 TO 2 TABLETS BY MOUTH TWICE A DAY 360 tablet 0  . calcium carbonate (OSCAL) 1500 (600 Ca) MG TABS tablet Take 600 mg by mouth 2 (two) times daily with a meal.    . carvedilol (COREG) 3.125 MG tablet Take 1 tablet (3.125 mg total) by mouth 2 (two) times daily with a meal. 180 tablet 1  . Cholecalciferol (VITAMIN D3) 2000 units TABS Take 2 tablets by mouth daily.     Marland Kitchen Dextromethorphan-Guaifenesin (MUCINEX DM MAXIMUM STRENGTH) 60-1200 MG TB12 Take 1 tablet by mouth 2 (two) times daily. Reported on 06/23/2015    . DULoxetine (CYMBALTA) 30 MG capsule Take 1 capsule (30 mg total) by mouth daily. Plan to see her PCP for further refills 90 capsule 1  . DULoxetine (CYMBALTA) 60 MG capsule Take 1 capsule (60 mg total) by mouth daily. 90 capsule 0  . ferrous gluconate (FERGON) 324 MG tablet TAKE 1 TABLET BY MOUTH EVERY DAY WITH BREAKFAST 90 tablet 2  . Fluticasone-Umeclidin-Vilant (TRELEGY ELLIPTA) 100-62.5-25 MCG/INH AEPB Inhale 1 puff into the lungs daily. Rinse mouth 60 each 11  . furosemide (LASIX) 20 MG tablet Take  2 tablets (40 mg total) by mouth daily. 180 tablet 3  . glucose blood (FREESTYLE TEST STRIPS) test strip Use to test blood sugar 3 times daily. Dx: E11.65 100 each 3  . HYDROcodone-acetaminophen (NORCO) 10-325 MG tablet Take 1 tablet by mouth every 4 (four) hours as needed.    Marland Kitchen ipratropium-albuterol (DUONEB) 0.5-2.5 (3) MG/3ML SOLN USE 1 VIAL IN NEBULIZER 3 TIMES DAILY 90 mL 12  . Lancets (FREESTYLE) lancets Use to test blood sugar 3 times daily. Dx: E11.65 100 each 3  . LANTUS SOLOSTAR 100 UNIT/ML Solostar Pen INJECT 20 UNITS INTO THE SKIN EVERY MORNING. 18 mL 8  . levothyroxine (SYNTHROID) 50 MCG tablet TAKE 1 TABLET BY MOUTH EVERY DAY BEFORE BREAKFAST 90 tablet 1  . lidocaine (LIDODERM) 5 % SMARTSIG:1-3 Patch(s) Topical Every 12 Hours PRN    . magnesium oxide (MAG-OX) 400 MG tablet TAKE 1 TABLET BY MOUTH EVERY DAY 90 tablet 1  . methenamine (MANDELAMINE) 1 g tablet SMARTSIG:1 Tablet(s) By Mouth Every 12 Hours    . Misc Natural Products (TART CHERRY ADVANCED PO) Take by mouth. Once daily    . montelukast (SINGULAIR) 10 MG tablet TAKE 1 TABLET BY MOUTH EVERY DAY 90 tablet 2  . Multiple Vitamins-Minerals (CENTRUM ADULTS PO) Take 1 tablet by mouth daily.     Marland Kitchen omeprazole (PRILOSEC) 20  MG capsule TAKE 1 CAPSULE BY MOUTH 2 TIMES A DAY BEFORE A MEAL 180 capsule 1  . predniSONE (DELTASONE) 1 MG tablet Take 2 tablets (2 mg total) by mouth daily. 60 tablet 1  . rosuvastatin (CRESTOR) 10 MG tablet Take 1 tablet (10 mg total) by mouth every evening. 90 tablet 1  . UNABLE TO FIND Take 1,000 mg by mouth daily. Med Name: Tumeric    . vitamin B-12 (CYANOCOBALAMIN) 250 MCG tablet Take 250 mcg by mouth See admin instructions. Twice weekly     No current facility-administered medications for this visit.   Allergies: Latex and Penicillins  No LMP recorded (lmp unknown). Patient has had a hysterectomy.  Past medical history,surgical history, problem list, medications, allergies, family history and social  history were all reviewed and documented as reviewed in the EPIC chart.  GYN ROS: no abnormal bleeding, pelvic pain or discharge, no breast pain or new or enlarging lumps on self exam.  No dysuria, frequency, burning, pain with urination, cloudy/malodorous urine.   OBJECTIVE:  BP 124/76   Ht 4\' 11"  (1.499 m)   Wt 142 lb (64.4 kg)   LMP  (LMP Unknown)   BMI 28.68 kg/m  The patient appears well, alert, oriented, in no distress.  BREAST EXAM: breasts appear normal, no suspicious masses, no skin or nipple changes or axillary nodes  PELVIC EXAM: VULVA: normal appearing vulva with atrophic change and diffuse erythema from atrophy, no masses, tenderness or lesions, left posterior labia majora with diffuse erythema/irritation and tenderness without mass, VAGINA: normal appearing vagina with atrophic change, normal color and discharge, no lesions, CERVIX: surgically absent, UTERUS: surgically absent, vaginal cuff normal, ADNEXA: Right pelvis mildly tender to deep palpation with sense of fullness without discrete mass, left side with no masses, nontender, RECTAL: normal rectal, no masses  Chaperone: Caryn Bee present during the examination  ASSESSMENT:  84 y.o. G1P1 here for a breast and pelvic exam  PLAN:   1. Postmenopausal. Prior hysterectomy BSO for ovarian cancer.  No significant menopausal symptoms. 2. Pap smear 2016.  No significant history of abnormal Pap smears.  She is comfortable with not continuing screening following the current guidelines. 3.  History of ovarian cancer, stage I.  1996.  Exam today with fullness in the RLQ which might be just due to constipation, but also a little tender in that area.  She was being followed with annual CA-125 levels but ultimately declined doing this anymore moving forward.  She understands the possibility and poor sensitivity of pelvic exam only for detecting recurrent disease.  I recommended checking a pelvic ultrasound due to the findings on  examination today.  We will schedule this as she leaves today. 4.  Vulvar irritation.  Discussed this is likely moisture related.  I recommended trying some topical lidocaine ointment as needed.  She is using petroleum jelly as a moisture barrier on the area and I encouraged her to continue to do this. 5. Mammogram 02/2019.  Normal breast exam today.  She is reminded to schedule an annual mammogram this year when due. 6. Colonoscopy 2015.  She will follow up at the interval recommended by her GI specialist.   7.  Osteoporosis.  She has managed to her primary physician's office, currently on Prolia and she will continue to follow-up with them in regards to her bone health. 8. Health maintenance.  No labs today as she normally has these completed elsewhere.  Return annually or sooner, prn.  Joseph Pierini MD 12/31/19

## 2020-01-01 DIAGNOSIS — R531 Weakness: Secondary | ICD-10-CM | POA: Diagnosis not present

## 2020-01-01 DIAGNOSIS — M1712 Unilateral primary osteoarthritis, left knee: Secondary | ICD-10-CM | POA: Diagnosis not present

## 2020-01-01 DIAGNOSIS — R262 Difficulty in walking, not elsewhere classified: Secondary | ICD-10-CM | POA: Diagnosis not present

## 2020-01-01 DIAGNOSIS — M25662 Stiffness of left knee, not elsewhere classified: Secondary | ICD-10-CM | POA: Diagnosis not present

## 2020-01-02 ENCOUNTER — Telehealth: Payer: Self-pay | Admitting: *Deleted

## 2020-01-02 MED ORDER — LIDOCAINE 5 % EX OINT: 1.0000 "application " | TOPICAL_OINTMENT | CUTANEOUS | 1 refills | Status: DC | PRN

## 2020-01-02 NOTE — Telephone Encounter (Signed)
Patient aware, Rx sent.  

## 2020-01-02 NOTE — Telephone Encounter (Signed)
I thought I already sent it. I sent in a prescription (again?) just now.

## 2020-01-02 NOTE — Telephone Encounter (Signed)
Patient called to follow up from [place] on 12/31/19 report she thought a Rx was going to be sent for the lidocaine ointment ?

## 2020-01-04 ENCOUNTER — Other Ambulatory Visit (HOSPITAL_COMMUNITY): Payer: Self-pay | Admitting: Internal Medicine

## 2020-01-04 DIAGNOSIS — L97901 Non-pressure chronic ulcer of unspecified part of unspecified lower leg limited to breakdown of skin: Secondary | ICD-10-CM

## 2020-01-08 ENCOUNTER — Ambulatory Visit (HOSPITAL_COMMUNITY): Payer: Medicare Other | Attending: Cardiovascular Disease

## 2020-01-08 ENCOUNTER — Other Ambulatory Visit: Payer: Self-pay

## 2020-01-08 ENCOUNTER — Other Ambulatory Visit: Payer: Medicare Other | Admitting: *Deleted

## 2020-01-08 DIAGNOSIS — I10 Essential (primary) hypertension: Secondary | ICD-10-CM | POA: Insufficient documentation

## 2020-01-08 DIAGNOSIS — E119 Type 2 diabetes mellitus without complications: Secondary | ICD-10-CM

## 2020-01-08 DIAGNOSIS — R600 Localized edema: Secondary | ICD-10-CM | POA: Insufficient documentation

## 2020-01-08 DIAGNOSIS — Z79899 Other long term (current) drug therapy: Secondary | ICD-10-CM

## 2020-01-08 LAB — ECHOCARDIOGRAM COMPLETE
AR max vel: 1.28 cm2
AV Area VTI: 1.39 cm2
AV Area mean vel: 1.34 cm2
AV Mean grad: 10 mmHg
AV Peak grad: 16 mmHg
Ao pk vel: 2 m/s
Area-P 1/2: 3.17 cm2
S' Lateral: 2.4 cm

## 2020-01-09 ENCOUNTER — Telehealth: Payer: Self-pay

## 2020-01-09 LAB — BASIC METABOLIC PANEL
BUN/Creatinine Ratio: 21 (ref 12–28)
BUN: 18 mg/dL (ref 8–27)
CO2: 25 mmol/L (ref 20–29)
Calcium: 9 mg/dL (ref 8.7–10.3)
Chloride: 100 mmol/L (ref 96–106)
Creatinine, Ser: 0.87 mg/dL (ref 0.57–1.00)
GFR calc Af Amer: 72 mL/min/{1.73_m2} (ref >59)
GFR calc non Af Amer: 62 mL/min/{1.73_m2} (ref >59)
Glucose: 110 mg/dL — ABNORMAL HIGH (ref 65–99)
Potassium: 4.7 mmol/L (ref 3.5–5.2)
Sodium: 138 mmol/L (ref 134–144)

## 2020-01-09 LAB — MAGNESIUM: Magnesium: 2.1 mg/dL (ref 1.6–2.3)

## 2020-01-09 NOTE — Telephone Encounter (Signed)
Pt given her results, per MD. She has no further questions/concerns at this time and will call if anything arises.

## 2020-01-10 ENCOUNTER — Ambulatory Visit (HOSPITAL_COMMUNITY)
Admission: RE | Admit: 2020-01-10 | Discharge: 2020-01-10 | Disposition: A | Discharge status: Home / Self Care | Payer: Medicare Other | Source: Ambulatory Visit | Attending: Cardiovascular Disease | Admitting: Cardiovascular Disease

## 2020-01-10 ENCOUNTER — Other Ambulatory Visit: Payer: Self-pay

## 2020-01-10 DIAGNOSIS — E119 Type 2 diabetes mellitus without complications: Secondary | ICD-10-CM

## 2020-01-10 DIAGNOSIS — Z79899 Other long term (current) drug therapy: Secondary | ICD-10-CM

## 2020-01-10 DIAGNOSIS — L97901 Non-pressure chronic ulcer of unspecified part of unspecified lower leg limited to breakdown of skin: Secondary | ICD-10-CM | POA: Diagnosis not present

## 2020-01-10 DIAGNOSIS — R600 Localized edema: Secondary | ICD-10-CM

## 2020-01-11 DIAGNOSIS — Z794 Long term (current) use of insulin: Secondary | ICD-10-CM | POA: Diagnosis not present

## 2020-01-11 DIAGNOSIS — Z79899 Other long term (current) drug therapy: Secondary | ICD-10-CM | POA: Diagnosis not present

## 2020-01-11 DIAGNOSIS — M542 Cervicalgia: Secondary | ICD-10-CM | POA: Diagnosis not present

## 2020-01-11 DIAGNOSIS — Z23 Encounter for immunization: Secondary | ICD-10-CM | POA: Diagnosis not present

## 2020-01-11 DIAGNOSIS — E119 Type 2 diabetes mellitus without complications: Secondary | ICD-10-CM | POA: Diagnosis not present

## 2020-01-12 ENCOUNTER — Other Ambulatory Visit: Payer: Self-pay

## 2020-01-12 ENCOUNTER — Emergency Department (HOSPITAL_COMMUNITY): Payer: Medicare Other

## 2020-01-12 ENCOUNTER — Inpatient Hospital Stay (HOSPITAL_COMMUNITY)
Admission: EM | Admit: 2020-01-12 | Discharge: 2020-01-15 | DRG: 871 | Disposition: A | Discharge status: Home Health | Payer: Medicare Other | Attending: Internal Medicine | Admitting: Internal Medicine

## 2020-01-12 ENCOUNTER — Encounter (HOSPITAL_COMMUNITY): Payer: Self-pay

## 2020-01-12 ENCOUNTER — Telehealth: Payer: Self-pay | Admitting: Physician Assistant

## 2020-01-12 DIAGNOSIS — R0602 Shortness of breath: Secondary | ICD-10-CM | POA: Diagnosis not present

## 2020-01-12 DIAGNOSIS — N179 Acute kidney failure, unspecified: Secondary | ICD-10-CM | POA: Diagnosis present

## 2020-01-12 DIAGNOSIS — J449 Chronic obstructive pulmonary disease, unspecified: Secondary | ICD-10-CM | POA: Diagnosis present

## 2020-01-12 DIAGNOSIS — Z8543 Personal history of malignant neoplasm of ovary: Secondary | ICD-10-CM | POA: Diagnosis not present

## 2020-01-12 DIAGNOSIS — Z66 Do not resuscitate: Secondary | ICD-10-CM | POA: Diagnosis present

## 2020-01-12 DIAGNOSIS — Z8249 Family history of ischemic heart disease and other diseases of the circulatory system: Secondary | ICD-10-CM

## 2020-01-12 DIAGNOSIS — B9629 Other Escherichia coli [E. coli] as the cause of diseases classified elsewhere: Secondary | ICD-10-CM | POA: Diagnosis not present

## 2020-01-12 DIAGNOSIS — R609 Edema, unspecified: Secondary | ICD-10-CM | POA: Diagnosis not present

## 2020-01-12 DIAGNOSIS — K219 Gastro-esophageal reflux disease without esophagitis: Secondary | ICD-10-CM | POA: Diagnosis present

## 2020-01-12 DIAGNOSIS — Z90710 Acquired absence of both cervix and uterus: Secondary | ICD-10-CM

## 2020-01-12 DIAGNOSIS — Z794 Long term (current) use of insulin: Secondary | ICD-10-CM

## 2020-01-12 DIAGNOSIS — E039 Hypothyroidism, unspecified: Secondary | ICD-10-CM | POA: Diagnosis present

## 2020-01-12 DIAGNOSIS — R7881 Bacteremia: Secondary | ICD-10-CM | POA: Diagnosis not present

## 2020-01-12 DIAGNOSIS — M797 Fibromyalgia: Secondary | ICD-10-CM | POA: Diagnosis present

## 2020-01-12 DIAGNOSIS — I5033 Acute on chronic diastolic (congestive) heart failure: Secondary | ICD-10-CM | POA: Diagnosis present

## 2020-01-12 DIAGNOSIS — R0902 Hypoxemia: Secondary | ICD-10-CM | POA: Diagnosis not present

## 2020-01-12 DIAGNOSIS — Z801 Family history of malignant neoplasm of trachea, bronchus and lung: Secondary | ICD-10-CM

## 2020-01-12 DIAGNOSIS — F32A Depression, unspecified: Secondary | ICD-10-CM | POA: Diagnosis present

## 2020-01-12 DIAGNOSIS — Z9221 Personal history of antineoplastic chemotherapy: Secondary | ICD-10-CM | POA: Diagnosis not present

## 2020-01-12 DIAGNOSIS — Z833 Family history of diabetes mellitus: Secondary | ICD-10-CM

## 2020-01-12 DIAGNOSIS — A498 Other bacterial infections of unspecified site: Secondary | ICD-10-CM | POA: Diagnosis not present

## 2020-01-12 DIAGNOSIS — F419 Anxiety disorder, unspecified: Secondary | ICD-10-CM | POA: Diagnosis present

## 2020-01-12 DIAGNOSIS — R6521 Severe sepsis with septic shock: Secondary | ICD-10-CM | POA: Diagnosis present

## 2020-01-12 DIAGNOSIS — Z8701 Personal history of pneumonia (recurrent): Secondary | ICD-10-CM

## 2020-01-12 DIAGNOSIS — A4151 Sepsis due to Escherichia coli [E. coli]: Secondary | ICD-10-CM | POA: Diagnosis present

## 2020-01-12 DIAGNOSIS — I248 Other forms of acute ischemic heart disease: Secondary | ICD-10-CM | POA: Diagnosis not present

## 2020-01-12 DIAGNOSIS — A419 Sepsis, unspecified organism: Secondary | ICD-10-CM | POA: Diagnosis not present

## 2020-01-12 DIAGNOSIS — E876 Hypokalemia: Secondary | ICD-10-CM | POA: Diagnosis not present

## 2020-01-12 DIAGNOSIS — M069 Rheumatoid arthritis, unspecified: Secondary | ICD-10-CM | POA: Diagnosis present

## 2020-01-12 DIAGNOSIS — E1165 Type 2 diabetes mellitus with hyperglycemia: Secondary | ICD-10-CM | POA: Diagnosis present

## 2020-01-12 DIAGNOSIS — Z1612 Extended spectrum beta lactamase (ESBL) resistance: Secondary | ICD-10-CM | POA: Diagnosis present

## 2020-01-12 DIAGNOSIS — D649 Anemia, unspecified: Secondary | ICD-10-CM | POA: Diagnosis present

## 2020-01-12 DIAGNOSIS — Z20822 Contact with and (suspected) exposure to covid-19: Secondary | ICD-10-CM | POA: Diagnosis present

## 2020-01-12 DIAGNOSIS — Z91040 Latex allergy status: Secondary | ICD-10-CM

## 2020-01-12 DIAGNOSIS — R000 Tachycardia, unspecified: Secondary | ICD-10-CM | POA: Diagnosis not present

## 2020-01-12 DIAGNOSIS — I5032 Chronic diastolic (congestive) heart failure: Secondary | ICD-10-CM | POA: Diagnosis present

## 2020-01-12 DIAGNOSIS — G9341 Metabolic encephalopathy: Secondary | ICD-10-CM | POA: Diagnosis present

## 2020-01-12 DIAGNOSIS — N390 Urinary tract infection, site not specified: Secondary | ICD-10-CM

## 2020-01-12 DIAGNOSIS — Z79899 Other long term (current) drug therapy: Secondary | ICD-10-CM

## 2020-01-12 DIAGNOSIS — L03115 Cellulitis of right lower limb: Secondary | ICD-10-CM | POA: Diagnosis present

## 2020-01-12 DIAGNOSIS — Z792 Long term (current) use of antibiotics: Secondary | ICD-10-CM

## 2020-01-12 DIAGNOSIS — I110 Hypertensive heart disease with heart failure: Secondary | ICD-10-CM | POA: Diagnosis present

## 2020-01-12 DIAGNOSIS — Z7952 Long term (current) use of systemic steroids: Secondary | ICD-10-CM

## 2020-01-12 DIAGNOSIS — I872 Venous insufficiency (chronic) (peripheral): Secondary | ICD-10-CM | POA: Diagnosis present

## 2020-01-12 DIAGNOSIS — R230 Cyanosis: Secondary | ICD-10-CM | POA: Diagnosis present

## 2020-01-12 DIAGNOSIS — Z1624 Resistance to multiple antibiotics: Secondary | ICD-10-CM | POA: Diagnosis present

## 2020-01-12 DIAGNOSIS — D6959 Other secondary thrombocytopenia: Secondary | ICD-10-CM | POA: Diagnosis present

## 2020-01-12 DIAGNOSIS — Z880 Allergy status to penicillin: Secondary | ICD-10-CM

## 2020-01-12 DIAGNOSIS — E860 Dehydration: Secondary | ICD-10-CM | POA: Diagnosis present

## 2020-01-12 DIAGNOSIS — Z79890 Hormone replacement therapy (postmenopausal): Secondary | ICD-10-CM

## 2020-01-12 DIAGNOSIS — D696 Thrombocytopenia, unspecified: Secondary | ICD-10-CM | POA: Diagnosis present

## 2020-01-12 DIAGNOSIS — Z87440 Personal history of urinary (tract) infections: Secondary | ICD-10-CM

## 2020-01-12 DIAGNOSIS — M810 Age-related osteoporosis without current pathological fracture: Secondary | ICD-10-CM | POA: Diagnosis present

## 2020-01-12 DIAGNOSIS — R4182 Altered mental status, unspecified: Secondary | ICD-10-CM | POA: Diagnosis not present

## 2020-01-12 DIAGNOSIS — M25512 Pain in left shoulder: Secondary | ICD-10-CM | POA: Diagnosis present

## 2020-01-12 LAB — URINALYSIS, ROUTINE W REFLEX MICROSCOPIC
Bilirubin Urine: NEGATIVE
Glucose, UA: NEGATIVE mg/dL
Ketones, ur: 20 mg/dL — AB
Nitrite: POSITIVE — AB
Protein, ur: 100 mg/dL — AB
Specific Gravity, Urine: 1.018 (ref 1.005–1.030)
WBC, UA: >50 WBC/hpf — ABNORMAL HIGH (ref 0–5)
pH: 5 (ref 5.0–8.0)

## 2020-01-12 LAB — LACTIC ACID, PLASMA
Lactic Acid, Venous: 1.4 mmol/L (ref 0.5–1.9)
Lactic Acid, Venous: 0.9 mmol/L (ref 0.5–1.9)

## 2020-01-12 LAB — BLOOD GAS, VENOUS
Acid-Base Excess: 1.1 mmol/L (ref 0.0–2.0)
Bicarbonate: 25.2 mmol/L (ref 20.0–28.0)
FIO2: 21
O2 Saturation: 38.3 %
Patient temperature: 98.6
pCO2, Ven: 40.4 mmHg — ABNORMAL LOW (ref 44.0–60.0)
pH, Ven: 7.413 (ref 7.250–7.430)
pO2, Ven: <31 mmHg — CL (ref 32.0–45.0)

## 2020-01-12 LAB — TROPONIN I (HIGH SENSITIVITY)
Troponin I (High Sensitivity): 57 ng/L — ABNORMAL HIGH (ref <18)
Troponin I (High Sensitivity): 69 ng/L — ABNORMAL HIGH (ref <18)

## 2020-01-12 LAB — CBG MONITORING, ED
Glucose-Capillary: 182 mg/dL — ABNORMAL HIGH (ref 70–99)
Glucose-Capillary: 120 mg/dL — ABNORMAL HIGH (ref 70–99)

## 2020-01-12 LAB — BASIC METABOLIC PANEL
Anion gap: 16 — ABNORMAL HIGH (ref 5–15)
BUN: 29 mg/dL — ABNORMAL HIGH (ref 8–23)
CO2: 22 mmol/L (ref 22–32)
Calcium: 8.7 mg/dL — ABNORMAL LOW (ref 8.9–10.3)
Chloride: 95 mmol/L — ABNORMAL LOW (ref 98–111)
Creatinine, Ser: 1.39 mg/dL — ABNORMAL HIGH (ref 0.44–1.00)
GFR, Estimated: 38 mL/min — ABNORMAL LOW (ref >60)
Glucose, Bld: 200 mg/dL — ABNORMAL HIGH (ref 70–99)
Potassium: 4.3 mmol/L (ref 3.5–5.1)
Sodium: 133 mmol/L — ABNORMAL LOW (ref 135–145)

## 2020-01-12 LAB — RESPIRATORY PANEL BY RT PCR (FLU A&B, COVID)
Influenza A by PCR: NEGATIVE
Influenza B by PCR: NEGATIVE
SARS Coronavirus 2 by RT PCR: NEGATIVE

## 2020-01-12 LAB — MAGNESIUM: Magnesium: 2 mg/dL (ref 1.7–2.4)

## 2020-01-12 LAB — BRAIN NATRIURETIC PEPTIDE: B Natriuretic Peptide: 264.6 pg/mL — ABNORMAL HIGH (ref 0.0–100.0)

## 2020-01-12 LAB — GLUCOSE, CAPILLARY: Glucose-Capillary: 203 mg/dL — ABNORMAL HIGH (ref 70–99)

## 2020-01-12 LAB — CORTISOL: Cortisol, Plasma: 15.8 ug/dL

## 2020-01-12 MED ORDER — ALBUMIN HUMAN 5 % IV SOLN
12.5000 g | Freq: Once | INTRAVENOUS | Status: AC
  Administered 2020-01-13: 12.5 g via INTRAVENOUS
  Filled 2020-01-12: qty 250

## 2020-01-12 MED ORDER — PREDNISONE 1 MG PO TABS
2.0000 mg | ORAL_TABLET | Freq: Every day | ORAL | Status: DC
  Administered 2020-01-13 – 2020-01-15 (×3): 2 mg via ORAL
  Filled 2020-01-12 (×3): qty 2

## 2020-01-12 MED ORDER — ACETAMINOPHEN 325 MG PO TABS
650.0000 mg | ORAL_TABLET | Freq: Once | ORAL | Status: AC
  Administered 2020-01-12: 650 mg via ORAL
  Filled 2020-01-12: qty 2

## 2020-01-12 MED ORDER — INSULIN GLARGINE 100 UNIT/ML ~~LOC~~ SOLN
15.0000 [IU] | Freq: Every day | SUBCUTANEOUS | Status: DC
  Administered 2020-01-13 – 2020-01-15 (×3): 15 [IU] via SUBCUTANEOUS
  Filled 2020-01-12 (×4): qty 0.15

## 2020-01-12 MED ORDER — SODIUM CHLORIDE 0.9 % IV SOLN: 2.0000 g | Freq: Once | INTRAVENOUS | Status: DC

## 2020-01-12 MED ORDER — LACTATED RINGERS IV BOLUS (SEPSIS)
250.0000 mL | Freq: Once | INTRAVENOUS | Status: AC
  Administered 2020-01-12: 250 mL via INTRAVENOUS

## 2020-01-12 MED ORDER — INSULIN ASPART 100 UNIT/ML ~~LOC~~ SOLN
0.0000 [IU] | Freq: Every day | SUBCUTANEOUS | Status: DC
  Administered 2020-01-12: 2 [IU] via SUBCUTANEOUS

## 2020-01-12 MED ORDER — BACLOFEN 10 MG PO TABS
5.0000 mg | ORAL_TABLET | Freq: Two times a day (BID) | ORAL | Status: DC
  Administered 2020-01-12 – 2020-01-14 (×5): 10 mg via ORAL
  Filled 2020-01-12 (×6): qty 1

## 2020-01-12 MED ORDER — FERROUS GLUCONATE 324 (38 FE) MG PO TABS
324.0000 mg | ORAL_TABLET | Freq: Every day | ORAL | Status: DC
  Administered 2020-01-13 – 2020-01-15 (×3): 324 mg via ORAL
  Filled 2020-01-12 (×3): qty 1

## 2020-01-12 MED ORDER — UMECLIDINIUM BROMIDE 62.5 MCG/INH IN AEPB
1.0000 | INHALATION_SPRAY | Freq: Every day | RESPIRATORY_TRACT | Status: DC
  Administered 2020-01-13 – 2020-01-15 (×3): 1 via RESPIRATORY_TRACT
  Filled 2020-01-12: qty 7

## 2020-01-12 MED ORDER — ONDANSETRON HCL 4 MG/2ML IJ SOLN: 4.0000 mg | Freq: Four times a day (QID) | INTRAMUSCULAR | Status: DC | PRN

## 2020-01-12 MED ORDER — SODIUM CHLORIDE 0.9 % IV SOLN
2.0000 g | INTRAVENOUS | Status: AC
  Administered 2020-01-12: 2 g via INTRAVENOUS
  Filled 2020-01-12: qty 2

## 2020-01-12 MED ORDER — DULOXETINE HCL 30 MG PO CPEP
30.0000 mg | ORAL_CAPSULE | Freq: Every day | ORAL | Status: DC
  Administered 2020-01-12 – 2020-01-13 (×2): 30 mg via ORAL
  Filled 2020-01-12 (×2): qty 1

## 2020-01-12 MED ORDER — HYDROCORTISONE NA SUCCINATE PF 100 MG IJ SOLR
50.0000 mg | Freq: Four times a day (QID) | INTRAMUSCULAR | Status: AC
  Administered 2020-01-12 – 2020-01-13 (×3): 50 mg via INTRAVENOUS
  Filled 2020-01-12 (×3): qty 2

## 2020-01-12 MED ORDER — ACETAMINOPHEN 325 MG PO TABS
650.0000 mg | ORAL_TABLET | Freq: Four times a day (QID) | ORAL | Status: DC | PRN
  Administered 2020-01-12 – 2020-01-13 (×2): 650 mg via ORAL
  Filled 2020-01-12 (×2): qty 2

## 2020-01-12 MED ORDER — LEVOTHYROXINE SODIUM 50 MCG PO TABS
50.0000 ug | ORAL_TABLET | Freq: Every day | ORAL | Status: DC
  Administered 2020-01-13 – 2020-01-15 (×3): 50 ug via ORAL
  Filled 2020-01-12 (×3): qty 1

## 2020-01-12 MED ORDER — VANCOMYCIN HCL IN DEXTROSE 1-5 GM/200ML-% IV SOLN: 1000.0000 mg | Freq: Once | INTRAVENOUS | Status: DC

## 2020-01-12 MED ORDER — MONTELUKAST SODIUM 10 MG PO TABS
10.0000 mg | ORAL_TABLET | Freq: Every day | ORAL | Status: DC
  Administered 2020-01-12 – 2020-01-14 (×3): 10 mg via ORAL
  Filled 2020-01-12 (×3): qty 1

## 2020-01-12 MED ORDER — VANCOMYCIN HCL 1250 MG/250ML IV SOLN
1250.0000 mg | Freq: Once | INTRAVENOUS | Status: AC
  Administered 2020-01-12: 1250 mg via INTRAVENOUS
  Filled 2020-01-12: qty 250

## 2020-01-12 MED ORDER — AMITRIPTYLINE HCL 10 MG PO TABS
10.0000 mg | ORAL_TABLET | Freq: Every day | ORAL | Status: DC
  Administered 2020-01-12 – 2020-01-14 (×3): 10 mg via ORAL
  Filled 2020-01-12 (×3): qty 1

## 2020-01-12 MED ORDER — METRONIDAZOLE IN NACL 5-0.79 MG/ML-% IV SOLN
500.0000 mg | Freq: Once | INTRAVENOUS | Status: AC
  Administered 2020-01-12: 500 mg via INTRAVENOUS
  Filled 2020-01-12: qty 100

## 2020-01-12 MED ORDER — LACTATED RINGERS IV SOLN: INTRAVENOUS | Status: DC

## 2020-01-12 MED ORDER — LACTATED RINGERS IV SOLN: INTRAVENOUS | Status: AC

## 2020-01-12 MED ORDER — ONDANSETRON HCL 4 MG PO TABS: 4.0000 mg | ORAL_TABLET | Freq: Four times a day (QID) | ORAL | Status: DC | PRN

## 2020-01-12 MED ORDER — ALBUTEROL SULFATE HFA 108 (90 BASE) MCG/ACT IN AERS
2.0000 | INHALATION_SPRAY | Freq: Four times a day (QID) | RESPIRATORY_TRACT | Status: DC | PRN
  Administered 2020-01-12: 2 via RESPIRATORY_TRACT
  Filled 2020-01-12: qty 6.7

## 2020-01-12 MED ORDER — INSULIN ASPART 100 UNIT/ML ~~LOC~~ SOLN
0.0000 [IU] | Freq: Three times a day (TID) | SUBCUTANEOUS | Status: DC
  Administered 2020-01-13: 2 [IU] via SUBCUTANEOUS
  Administered 2020-01-13: 5 [IU] via SUBCUTANEOUS
  Administered 2020-01-13 – 2020-01-15 (×3): 3 [IU] via SUBCUTANEOUS
  Filled 2020-01-12: qty 0.15

## 2020-01-12 MED ORDER — FLUTICASONE-UMECLIDIN-VILANT 100-62.5-25 MCG/INH IN AEPB: 1.0000 | INHALATION_SPRAY | Freq: Every day | RESPIRATORY_TRACT | Status: DC

## 2020-01-12 MED ORDER — BUSPIRONE HCL 10 MG PO TABS
5.0000 mg | ORAL_TABLET | Freq: Every day | ORAL | Status: DC | PRN
  Administered 2020-01-12: 10 mg via ORAL
  Filled 2020-01-12 (×2): qty 1

## 2020-01-12 MED ORDER — FLUTICASONE FUROATE-VILANTEROL 100-25 MCG/INH IN AEPB
1.0000 | INHALATION_SPRAY | Freq: Every day | RESPIRATORY_TRACT | Status: DC
  Administered 2020-01-13 – 2020-01-15 (×3): 1 via RESPIRATORY_TRACT
  Filled 2020-01-12: qty 28

## 2020-01-12 MED ORDER — ENOXAPARIN SODIUM 30 MG/0.3ML ~~LOC~~ SOLN
30.0000 mg | SUBCUTANEOUS | Status: DC
  Administered 2020-01-12 – 2020-01-14 (×3): 30 mg via SUBCUTANEOUS
  Filled 2020-01-12 (×3): qty 0.3

## 2020-01-12 MED ORDER — SODIUM CHLORIDE 0.9 % IV SOLN
2.0000 g | INTRAVENOUS | Status: DC
  Filled 2020-01-12: qty 2

## 2020-01-12 MED ORDER — IPRATROPIUM-ALBUTEROL 0.5-2.5 (3) MG/3ML IN SOLN: 3.0000 mL | Freq: Three times a day (TID) | RESPIRATORY_TRACT | Status: DC | PRN

## 2020-01-12 MED ORDER — VANCOMYCIN HCL 500 MG/100ML IV SOLN
500.0000 mg | INTRAVENOUS | Status: DC
  Filled 2020-01-12: qty 100

## 2020-01-12 MED ORDER — HYDROCODONE-ACETAMINOPHEN 10-325 MG PO TABS
1.0000 | ORAL_TABLET | Freq: Four times a day (QID) | ORAL | Status: DC | PRN
  Administered 2020-01-12 – 2020-01-14 (×5): 1 via ORAL
  Filled 2020-01-12 (×5): qty 1

## 2020-01-12 NOTE — ED Provider Notes (Signed)
[facility] DEPT Provider Note   CSN: [id number] Arrival date & time: 01/12/20  9357     History No chief complaint on file.   Kayla Thompson is a 84 y.o. female history of diabetes, COPD, GERD, fibromyalgia, osteoporosis, CHF.  History obtained from EMS by triage RN shows patient with shortness of breath and bilateral leg swelling, normally on 2 L nasal cannula but increased to 4 L.  No additional history. - On initial evaluation patient reports she has been feeling short of breath for the past 4 days she is unsure why, she is dyspnea oriented, alert to name only reports the year is 1498.  She denies any pain or other concerns denies any recent history of fever nausea vomiting falls or injury.  Level 5 caveat altered mental status.  HPI     Past Medical History:  Diagnosis Date  . Anxiety   . Arthritis   . Asthma   . Cataracts, bilateral    removed by surgery  . COPD (chronic obstructive pulmonary disease) ([place])   . Depression   . Diabetes mellitus without complication ([place])    type 2  . Dyspnea    with exertion  . Fibromyalgia   . GERD (gastroesophageal reflux disease)   . History of chemotherapy   . History of fractured pelvis   . Hypertension   . Hypothyroidism   . Osteoporosis 12/2017   T score -2.8 stable from prior DEXA  . Osteoporosis   . Ovarian cancer ([place])   . Pneumonia    several times    Patient Active Problem List   Diagnosis Date Noted  . Sepsis ([place]) 01/12/2020  . Lower extremity edema 11/20/2019  . Pre-operative cardiovascular examination 10/18/2019  . UTI symptoms 05/29/2019  . Dysuria 05/11/2019  . Upper esophageal web 03/27/2019  . Iron deficiency anemia 03/27/2019  . Candida esophagitis ([place]) 02/14/2019  . Encounter for general adult medical examination with abnormal findings 01/09/2019  . Abnormal barium swallow 12/28/2018  . Throat clearing 12/28/2018  . History of colonic polyps 12/28/2018  .  Dysphagia 12/28/2018  . Diabetic foot ulcer ([place]) 12/05/2018  . Rotator cuff arthropathy, left 11/16/2018  . Degenerative arthritis of left knee 11/16/2018  . Insomnia 09/26/2018  . Leukocytosis 10/17/2017  . Malnutrition of moderate degree 10/10/2017  . Memory changes 07/29/2017  . Granulomatous lung disease ([place]) 06/14/2017  . Tracheobronchomalacia 06/14/2017  . Dizziness 03/04/2017  . Exercise hypoxemia 07/12/2016  . Diastolic CHF, chronic ([place]) 12/25/2015  . GERD (gastroesophageal reflux disease) 12/25/2015  . Epidermal inclusion cyst 09/02/2015  . Type 2 diabetes mellitus with hyperglycemia, with long-term current use of insulin ([place]) 03/10/2015  . Muscle cramps 03/06/2015  . Cough 01/07/2015  . Bronchiectasis without acute exacerbation ([place]) 01/07/2015  . PMR (polymyalgia rheumatica) ([place]) 01/07/2015  . Osteoarthritis 01/07/2015  . Asthma, chronic 11/12/2014  . Anxiety state 11/12/2014  . Hyperparathyroidism ([place]) 10/28/2014  . Fibromyalgia 09/21/2014  . Personal history of ovarian cancer 08/23/2014  . Osteoporosis 08/23/2014  . Diabetes mellitus with coincident hypertension ([place]) 08/02/2014  . HLD (hyperlipidemia) 08/02/2014  . Hypothyroidism 08/02/2014    Past Surgical History:  Procedure Laterality Date  . ABDOMINAL HYSTERECTOMY  1996  . ANKLE FRACTURE SURGERY     plate and screws  . BIOPSY  02/05/2019   Procedure: BIOPSY;  Surgeon: Rush Landmark Telford Nab., MD;  Location: [facility];  Service: Gastroenterology;;  . BLADDER SUSPENSION    . CATARACT EXTRACTION    .  COLONOSCOPY    . ESOPHAGEAL BRUSHING  02/05/2019   Procedure: ESOPHAGEAL BRUSHING;  Surgeon: Rush Landmark Telford Nab., MD;  Location: [facility];  Service: Gastroenterology;;  . ESOPHAGOGASTRODUODENOSCOPY (EGD) WITH PROPOFOL N/A 02/05/2019   Procedure: ESOPHAGOGASTRODUODENOSCOPY (EGD) WITH PROPOFOL with dialtion;  Surgeon: Irving Copas., MD;  Location: [facility];  Service:  Gastroenterology;  Laterality: N/A;  . EYE SURGERY     cataracts removed-bilateral  . hip surgey    . knee surgey    . LUNG SURGERY    . OOPHORECTOMY     BSO  . SAVORY DILATION N/A 02/05/2019   Procedure: SAVORY DILATION;  Surgeon: Rush Landmark Telford Nab., MD;  Location: [facility];  Service: Gastroenterology;  Laterality: N/A;  . TONSILLECTOMY    . UPPER GI ENDOSCOPY       OB History    Gravida  1   Para  1   Term      Preterm      AB      Living  1     SAB      TAB      Ectopic      Multiple      Live Births              Family History  Problem Relation Age of Onset  . Lung cancer Mother   . CAD Father   . Diabetes Father   . Lung cancer Maternal Aunt   . Diabetes Paternal Aunt   . Diabetes Paternal Uncle   . Diabetes Paternal Grandmother   . Diabetes Paternal Grandfather   . Colon cancer Neg Hx   . Esophageal cancer Neg Hx   . Inflammatory bowel disease Neg Hx   . Liver disease Neg Hx   . Pancreatic cancer Neg Hx   . Rectal cancer Neg Hx   . Stomach cancer Neg Hx     Social History   Tobacco Use  . Smoking status: Never Smoker  . Smokeless tobacco: Never Used  Vaping Use  . Vaping Use: Never used  Substance Use Topics  . Alcohol use: No    Alcohol/week: 0.0 standard drinks  . Drug use: No    Home Medications Prior to Admission medications   Medication Sig Start Date End Date Taking? Authorizing Provider  albuterol (PROAIR HFA) 108 (90 Base) MCG/ACT inhaler Inhale 2 puffs into the lungs every 6 (six) hours as needed for wheezing or shortness of breath. 08/27/19  Yes Young, Tarri Fuller D, MD  amitriptyline (ELAVIL) 10 MG tablet Take 1 tablet (10 mg total) by mouth at bedtime. 02/13/19  Yes [name], MD  baclofen (LIORESAL) 10 MG tablet TAKE 0.5-1 TABLET BY MOUTH TWICE DAILY AS NEEDED FOR MUSCLE SPASMS OR PAIN Patient taking differently: Take 5-10 mg by mouth 2 (two) times daily. TAKE 0.5-1 TABLET BY MOUTH TWICE DAILY AS NEEDED  FOR MUSCLE SPASMS OR PAIN 03/19/19  Yes [name], MD  BD PEN NEEDLE NANO 2ND GEN 32G X 4 MM MISC USE AS DIRECTED 4 TIMES A DAY 10/30/19  Yes [name], MD  busPIRone (BUSPAR) 5 MG tablet TAKE 1 TO 2 TABLETS BY MOUTH TWICE A DAY Patient taking differently: Take 5-10 mg by mouth daily as needed. Anxiety 12/26/19  Yes Marrian Salvage, FNP  calcium carbonate (OSCAL) 1500 (600 Ca) MG TABS tablet Take 600 mg by mouth 2 (two) times daily with a meal.   Yes [provider]  carvedilol (COREG) 3.125 MG  tablet Take 1 tablet (3.125 mg total) by mouth 2 (two) times daily with a meal. 07/02/19  Yes [name], MD  Cholecalciferol (VITAMIN D3) 2000 units TABS Take 2 tablets by mouth daily.    Yes [provider]  Dextromethorphan-Guaifenesin (MUCINEX DM MAXIMUM STRENGTH) 60-1200 MG TB12 Take 1 tablet by mouth 2 (two) times daily. Reported on 06/23/2015   Yes [provider]  DULoxetine (CYMBALTA) 30 MG capsule Take 1 capsule (30 mg total) by mouth daily. Plan to see her PCP for further refills 12/26/19  Yes Marrian Salvage, FNP  DULoxetine (CYMBALTA) 60 MG capsule Take 1 capsule (60 mg total) by mouth daily. 12/26/19  Yes Marrian Salvage, FNP  ferrous gluconate (FERGON) 324 MG tablet TAKE 1 TABLET BY MOUTH EVERY DAY WITH BREAKFAST Patient taking differently: Take 324 mg by mouth daily with breakfast.  06/27/19  Yes Mansouraty, Telford Nab., MD  Fluticasone-Umeclidin-Vilant (TRELEGY ELLIPTA) 100-62.5-25 MCG/INH AEPB Inhale 1 puff into the lungs daily. Rinse mouth 07/02/19  Yes Young, Tarri Fuller D, MD  furosemide (LASIX) 20 MG tablet Take 2 tablets (40 mg total) by mouth daily. 12/21/19  Yes Chandrasekhar, Mahesh A, MD  glucose blood (FREESTYLE TEST STRIPS) test strip Use to test blood sugar 3 times daily. Dx: E11.65 10/14/16  Yes Philemon Kingdom, MD  HYDROcodone-acetaminophen (NORCO) 10-325 MG tablet Take 1 tablet by mouth every 4 (four) hours as  needed. 11/15/19  Yes [provider]  ipratropium-albuterol (DUONEB) 0.5-2.5 (3) MG/3ML SOLN USE 1 VIAL IN NEBULIZER 3 TIMES DAILY Patient taking differently: Inhale 3 mLs into the lungs 3 (three) times daily as needed (shortness of breath). USE 1 VIAL IN NEBULIZER 3 TIMES DAILY 11/25/19  Yes Young, Clinton D, MD  Lancets (FREESTYLE) lancets Use to test blood sugar 3 times daily. Dx: E11.65 10/14/16  Yes Philemon Kingdom, MD  LANTUS SOLOSTAR 100 UNIT/ML Solostar Pen INJECT 20 UNITS INTO THE SKIN EVERY MORNING. Patient taking differently: Inject 15 Units into the skin daily.  07/31/19  Yes Philemon Kingdom, MD  levothyroxine (SYNTHROID) 50 MCG tablet TAKE 1 TABLET BY MOUTH EVERY DAY BEFORE BREAKFAST Patient taking differently: Take 50 mcg by mouth daily before breakfast. TAKE 1 TABLET BY MOUTH EVERY DAY BEFORE BREAKFAST 12/13/19  Yes [name], MD  lidocaine (LIDODERM) 5 % Place 1 patch onto the skin every 12 (twelve) hours as needed. pain 06/12/19  Yes [provider]  lidocaine (XYLOCAINE) 5 % ointment Apply 1 application topically as needed. Apply to external vulvar area 01/02/20  Yes Joseph Pierini, MD  magnesium oxide (MAG-OX) 400 MG tablet TAKE 1 TABLET BY MOUTH EVERY DAY Patient taking differently: Take 400 mg by mouth daily.  07/31/19  Yes [name], MD  methenamine (MANDELAMINE) 1 g tablet Take 1,000 mg by mouth 2 (two) times daily. Cont. 07/06/19  Yes [provider]  Misc Natural Products (TART CHERRY ADVANCED PO) Take by mouth. Once daily   Yes [provider]  montelukast (SINGULAIR) 10 MG tablet TAKE 1 TABLET BY MOUTH EVERY DAY Patient taking differently: Take 10 mg by mouth at bedtime.  10/30/19  Yes Young, Tarri Fuller D, MD  Multiple Vitamins-Minerals (CENTRUM ADULTS PO) Take 1 tablet by mouth daily.    Yes [provider]  omeprazole (PRILOSEC) 20 MG capsule TAKE 1 CAPSULE BY MOUTH 2 TIMES A DAY BEFORE A MEAL Patient taking  differently: Take 20 mg by mouth 2 (two) times daily before a meal.  11/27/19  Yes Mansouraty,  Telford Nab., MD  predniSONE (DELTASONE) 1 MG tablet Take 2 tablets (2 mg total) by mouth daily. 07/25/18  Yes [name], MD  rosuvastatin (CRESTOR) 10 MG tablet Take 1 tablet (10 mg total) by mouth every evening. 07/02/19  Yes [name], MD  UNABLE TO FIND Take 1,000 mg by mouth daily. Med Name: Tumeric   Yes [provider]  vitamin B-12 (CYANOCOBALAMIN) 250 MCG tablet Take 250 mcg by mouth See admin instructions. Twice weekly  ( Monday & Thursday )   Yes [provider]    Allergies    Latex and Penicillins  Review of Systems   Review of Systems  Unable to perform ROS: Mental status change    Physical Exam Updated Vital Signs BP 98/61   Pulse (!) 110   Temp 99.7 F (37.6 C) (Rectal)   Resp (!) 28   LMP  (LMP Unknown)   SpO2 94%   Physical Exam Constitutional:      General: She is not in acute distress.    Appearance: Normal appearance. She is well-developed. She is not ill-appearing or diaphoretic.  HENT:     Head: Normocephalic and atraumatic.  Eyes:     General: Vision grossly intact. Gaze aligned appropriately.     Pupils: Pupils are equal, round, and reactive to light.  Neck:     Trachea: Trachea and phonation normal.  Cardiovascular:     Rate and Rhythm: Regular rhythm. Tachycardia present.  Pulmonary:     Effort: Pulmonary effort is normal. No respiratory distress.  Abdominal:     General: There is no distension.     Palpations: Abdomen is soft.     Tenderness: There is no abdominal tenderness. There is no guarding or rebound.  Musculoskeletal:        General: Normal range of motion.     Cervical back: Normal range of motion.     Right lower leg: Edema present.     Left lower leg: Edema present.  Skin:    General: Skin is warm and dry.  Neurological:     Mental Status: She is alert.     GCS: GCS eye subscore is 4. GCS verbal  subscore is 5. GCS motor subscore is 6.     Comments: Speech is clear and goal oriented, follows commands Major Cranial nerves without deficit, no facial droop Moves extremities without ataxia, coordination intact  Psychiatric:        Behavior: Behavior normal.     ED Results / Procedures / Treatments   Labs (all labs ordered are listed, but only abnormal results are displayed) Labs Reviewed  BASIC METABOLIC PANEL - Abnormal; Notable for the following components:      Result Value   Sodium 133 (*)    Chloride 95 (*)    Glucose, Bld 200 (*)    BUN 29 (*)    Creatinine, Ser 1.39 (*)    Calcium 8.7 (*)    GFR, Estimated 38 (*)    Anion gap 16 (*)    All other components within normal limits  BRAIN NATRIURETIC PEPTIDE - Abnormal; Notable for the following components:   B Natriuretic Peptide 264.6 (*)    All other components within normal limits  CBC WITH DIFFERENTIAL/PLATELET - Abnormal; Notable for the following components:   WBC 11.5 (*)    Hemoglobin 16.6 (*)    HCT 51.7 (*)    MCV 108.4 (*)    MCH 34.8 (*)  Platelets 137 (*)    Neutro Abs 10.2 (*)    All other components within normal limits  URINALYSIS, ROUTINE W REFLEX MICROSCOPIC - Abnormal; Notable for the following components:   APPearance CLOUDY (*)    Hgb urine dipstick MODERATE (*)    Ketones, ur 20 (*)    Protein, ur 100 (*)    Nitrite POSITIVE (*)    Leukocytes,Ua LARGE (*)    WBC, UA >50 (*)    Bacteria, UA MANY (*)    All other components within normal limits  BLOOD GAS, VENOUS - Abnormal; Notable for the following components:   pCO2, Ven 40.4 (*)    pO2, Ven <31.0 (*)    All other components within normal limits  CBG MONITORING, ED - Abnormal; Notable for the following components:   Glucose-Capillary 182 (*)    All other components within normal limits  TROPONIN I (HIGH SENSITIVITY) - Abnormal; Notable for the following components:   Troponin I (High Sensitivity) 57 (*)    All other components  within normal limits  TROPONIN I (HIGH SENSITIVITY) - Abnormal; Notable for the following components:   Troponin I (High Sensitivity) 69 (*)    All other components within normal limits  RESPIRATORY PANEL BY RT PCR (FLU A&B, COVID)  URINE CULTURE  CULTURE, BLOOD (ROUTINE X 2)  CULTURE, BLOOD (ROUTINE X 2)  LACTIC ACID, PLASMA  LACTIC ACID, PLASMA  CORTISOL  MAGNESIUM    EKG EKG Interpretation  Date/Time:  Saturday January 12 2020 10:21:29 EDT Ventricular Rate:  115 PR Interval:    QRS Duration: 97 QT Interval:  359 QTC Calculation: 497 R Axis:   60 Text Interpretation: Sinus tachycardia Borderline prolonged QT interval Since last tracing QT has lengthened Otherwise no significant change Confirmed by Daleen Bo [id number]) on 01/12/2020 11:02:20 AM   Radiology CT Head Wo Contrast  Result Date: 01/12/2020 CLINICAL DATA:  Altered mental status EXAM: CT HEAD WITHOUT CONTRAST TECHNIQUE: Contiguous axial images were obtained from the base of the skull through the vertex without intravenous contrast. COMPARISON:  12/24/2019 FINDINGS: Brain: No evidence of acute infarction, hemorrhage, hydrocephalus, extra-axial collection or mass lesion/mass effect. Mild low-density changes within the periventricular and subcortical white matter compatible with chronic microvascular ischemic change. Mild diffuse cerebral volume loss. Vascular: Atherosclerotic calcifications involving the large vessels of the skull base. No unexpected hyperdense vessel. Skull: Normal. Negative for fracture or focal lesion. Sinuses/Orbits: No acute finding. Other: None. IMPRESSION: 1. No acute intracranial findings.  Stable exam from 12/24/2019. 2. Chronic microvascular ischemic change and cerebral volume loss. Electronically Signed   By: Davina Poke D.O.   On: 01/12/2020 12:17   DG Chest Port 1 View  Result Date: 01/12/2020 CLINICAL DATA:  Shortness of breath. EXAM: PORTABLE CHEST 1 VIEW COMPARISON:  November 20, 2019.  FINDINGS: The heart size and mediastinal contours are within normal limits. Both lungs are clear. No pneumothorax or pleural effusion is noted. The visualized skeletal structures are unremarkable. IMPRESSION: No active disease. Aortic Atherosclerosis (ICD10-I70.0). Electronically Signed   By: Marijo Conception M.D.   On: 01/12/2020 11:30    Procedures .Critical Care Performed by: Deliah Boston, PA-C Authorized by: Deliah Boston, PA-C   Critical care provider statement:    Critical care time (minutes):  35   Critical care was necessary to treat or prevent imminent or life-threatening deterioration of the following conditions:  Sepsis   Critical care was time spent personally by me on the following activities:  Discussions with consultants, evaluation of patient's response to treatment, examination of patient, ordering and performing treatments and interventions, ordering and review of laboratory studies, ordering and review of radiographic studies, pulse oximetry, re-evaluation of patient's condition, obtaining history from patient or surrogate, review of old charts and development of treatment plan with patient or surrogate   (including critical care time)  Medications Ordered in ED Medications  metroNIDAZOLE (FLAGYL) IVPB 500 mg (500 mg Intravenous New Bag/Given 01/12/20 1350)  vancomycin (VANCOREADY) IVPB 1250 mg/250 mL (has no administration in time range)  lactated ringers bolus 250 mL (250 mLs Intravenous New Bag/Given 01/12/20 1210)  acetaminophen (TYLENOL) tablet 650 mg (650 mg Oral Given 01/12/20 1251)  ceFEPIme (MAXIPIME) 2 g in sodium chloride 0.9 % 100 mL IVPB (0 g Intravenous Stopped 01/12/20 1351)    ED Course  I have reviewed the triage vital signs and the nursing notes.  Pertinent labs & imaging results that were available during my care of the patient were reviewed by me and considered in my medical decision making (see chart for details).  Clinical Course as of  Jan 11 1422  Sat Jan 12, 2020  1026 [name],[name] Daughter [phone]      [BM]    Clinical Course User Index [BM] Gari Crown   MDM Rules/Calculators/A&P                         Additional history obtained from: 1. Nursing notes from this visit. 2. Review of electronic medical record. 3. Family member. ---- 84 year old female presented with shortness of breath past 4 days, increased O2 requirement 4 L nasal cannula.  She is disoriented on initial evaluation.  I was able to contact patient's daughter [name] who advises patient is normally fully alert and oriented but does tend to overuse her hydrocodone at home and will occasionally have episodes of confusion.  I on arrival patient afebrile does have mild tachycardia is suspected secondary to shortness of breath.  Labs including CBC, BMP, BNP, Covid ordered.  Chest x-ray CT head and EKG ordered.  I have also asked staff to obtain rectal temperature. - A rectal temperature revealed a fever of 101.5 F, patient meets SIRS/sepsis criteria, currently unknown source.  Broad-spectrum antibiotics ordered, given patient appears volume overloaded and has no hypotension avoided 30 cc/kg fluid bolus.  Patient seen and evaluated by Dr. Eulis Foster. - I ordered, reviewed and interpreted labs which include: Lactic 1.4 reassuring Covid/influenza panel negative BMP shows mild AKI and gap, no emergent electrolyte derangement. CBC with mild leukocytosis and mild elevated hemoglobin of 16.6 Blood gas shows decreased PaO2, normal pH. CBG 182. High-sensitivity troponin of 57  Chest x-ray IMPRESSION:  No active disease.    Aortic Atherosclerosis (ICD10-I70.0).   CT Head: IMPRESSION:  1. No acute intracranial findings. Stable exam from 12/24/2019.  2. Chronic microvascular ischemic change and cerebral volume loss.   EKG: Sinus tachycardia Borderline prolonged QT interval Since last tracing QT has lengthened Otherwise no significant change  Confirmed by Daleen Bo [phone]) on 01/12/2020 11:02:20 AM ----------------- Discussed case with Dr. Darrick Meigs, patient accepted to hospitalist service.   Patient reassessed resting comfortably no acute distress, daughter at bedside, states understanding of care plan is agreeable for admission.  RN obtaining urinalysis. - Urinalysis reveals UTI, patient has been started on broad-spectrum antibiotics, care taken over by hospitalist service.  ZINIA INNOCENT was evaluated in Emergency Department on 01/12/2020 for the symptoms described in the history  of present illness. She was evaluated in the context of the global COVID-19 pandemic, which necessitated consideration that the patient might be at risk for infection with the SARS-CoV-2 virus that causes COVID-19. Institutional protocols and algorithms that pertain to the evaluation of patients at risk for COVID-19 are in a state of rapid change based on information released by regulatory bodies including the CDC and federal and state organizations. These policies and algorithms were followed during the patient's care in the ED.  Note: Portions of this report may have been transcribed using voice recognition software. Every effort was made to ensure accuracy; however, inadvertent computerized transcription errors may still be present. Final Clinical Impression(s) / ED Diagnoses Final diagnoses:  Sepsis, due to unspecified organism, unspecified whether acute organ dysfunction present [facility])    Rx / DC Orders ED Discharge Orders    None       Gari Crown 01/12/20 1424    Daleen Bo, MD 01/13/20 [phone]

## 2020-01-12 NOTE — Progress Notes (Signed)
Pharmacy Antibiotic Note  Kayla Thompson is a 84 y.o. female admitted on 01/12/2020 with sepsis.  Pharmacy has been consulted for vancomycin and cefepime dosing.  Plan:  Vancomycin 1250 mg IV x1, then vancomycin 500 mg IV q24h   Cefepime 2 gr IV q24h  Monitor clinical course, renal function, cultures as available      Temp (24hrs), Avg:99.9 F (37.7 C), Min:98.4 F (36.9 C), Max:101.5 F (38.6 C)  Recent Labs  Lab 01/08/20 1344 01/12/20 1048 01/12/20 1233 01/12/20 1534  WBC  --  11.5*  --   --   CREATININE 0.87 1.39*  --   --   LATICACIDVEN  --   --  1.4 0.9    Estimated Creatinine Clearance: 25.5 mL/min (A) (by C-G formula based on SCr of 1.39 mg/dL (H)).    Allergies  Allergen Reactions  . Latex Rash  . Penicillins Rash    Has patient had a PCN reaction causing immediate rash, facial/tongue/throat swelling, SOB or lightheadedness with hypotension: No Has patient had a PCN reaction causing severe rash involving mucus membranes or skin necrosis: No Has patient had a PCN reaction that required hospitalization: No Has patient had a PCN reaction occurring within the last 10 years: No If all of the above answers are "NO", then may proceed with Cephalosporin use.     Antimicrobials this admission: 10/23 cefepime >>  10/23 vancomycin >>   Dose adjustments this admission:   Microbiology results: 10/23 BCx:  10/23 UCx:  10/23 resp panel: negative     Thank you for allowing pharmacy to be a part of this patient's care.   Royetta Asal, PharmD, BCPS 01/12/2020 4:43 PM

## 2020-01-12 NOTE — Progress Notes (Signed)
elink monitoring complete. Pt not required to receive full fluid resucitation amt w/ LA <2 and acceptable BP.

## 2020-01-12 NOTE — ED Triage Notes (Signed)
Patient coming from home c/o shortness of breath and bilateral leg swelling. Patient on 2L [place] at home. Currently on 4 L [place].

## 2020-01-12 NOTE — Telephone Encounter (Signed)
Received phone call from patient's daughter, [name]. DPR is on file to disclose PHI. The pt is in the [facility] ED now and is being admitted. Reviewed chart.  It looks like she is being admitted for sepsis.  UA is abnormal, so it appears to be urosepsis. Patient's daughter notes her sister was told she may have CHF.   She was seen by Dr. Gasper Sells recently for evaluation of leg swelling.  Echocardiogram demonstrated normal EF, mild AS. Her furosemide was increased. Her CXR in the ED is negative for edema.  BNP mildly elevated. Since DPR on file I was able to inform Ms. [name] that it appears she is being admitted for sepsis.  I reassured her that she is in excellent hands with our hospitalist service and that we will be called if there are any cardiac concerns.    She would like me to inform her doctor that her mother is being admitted.  I will fwd to Dr. Gasper Sells as Juluis Rainier.  Richardson Dopp, PA-C    01/12/2020 2:19 PM

## 2020-01-12 NOTE — ED Provider Notes (Signed)
  Face-to-face evaluation   History: She presents for evaluation of altered mental status, noted today by caregivers. Patient lives alone with a during the day. She is here in the ED with her daughter, who states that she was having trouble with cognition, and recognition of people and place.  Physical exam: Elderly white female who appears comfortable but is confused. No dysarthria or aphasia. No respiratory distress. Mild peripheral edema, lower legs, left greater than right.  Medical screening examination/treatment/procedure(s) were conducted as a shared visit with non-physician practitioner(s) and myself.  I personally evaluated the patient during the encounter \   Daleen Bo, MD 01/13/20 [phone]

## 2020-01-12 NOTE — Progress Notes (Signed)
A consult was received from an ED physician for Vancomycin per pharmacy dosing. A consult was also received to investigate beta-lactam allergy. If history of intolerance, mild allergy, or documented history of use of cephalosporins, pharmacy can adjust aztreonam to cefepime. The patient's profile has been reviewed for ht/wt/allergies/indication/available labs. Upon review of patient's chart, patient has tolerated several cephalosporins in the past.   A one time order has been placed for Vancomycin 1250mg  IV and Cefepime 2g IV.  Further antibiotics/pharmacy consults should be ordered by admitting physician if indicated.                       Thank you, [name] 01/12/2020  11:32 AM

## 2020-01-12 NOTE — ED Notes (Signed)
Provider paged due to pt condition. Pressures are starting to drop (97/61), pt RR is 30-40 times per minute, she is very sleepy, and her HR is staying in the 115s, 120s.

## 2020-01-12 NOTE — ED Notes (Signed)
Pt was given chicken broth.

## 2020-01-12 NOTE — H&P (Signed)
TRH H&P    Patient Demographics:    Kayla Thompson, is a 84 y.o. female  MRN: [id number]  DOB - 1935/07/22  Admit Date - 01/12/2020  Referring MD/NP/PA: Dr. Wilburt Finlay  Outpatient Primary MD for the patient is Marrian Salvage, [place]  Patient coming from: Home  Chief complaint-lethargic/altered mental status   HPI:    Kayla Thompson  is a 84 y.o. female, with history of diabetes mellitus type 2, diastolic CHF, hypothyroidism, hyperlipidemia, COPD, fibromyalgia, GERD, ovarian cancer, chronic UTI on suppressive antibiotic therapy was brought to hospital from home with complaints of altered mental status/lethargy.  Patient was found to be febrile with temperature 101.5, she was tachypneic with respirations 25 to 30/min, tachycardic with heart rate more than 110/min.  Patient was found to have abnormal UA, started empirically on vancomycin and cefepime for sepsis.  She received fluid bolus with Ringer lactate at 250 cc/h, she did not receive 30 mL/kg fluid bolus as blood pressure had stabilized and also she has underlying diastolic CHF.  BNP is elevated at 264.6.  Initial lactic acid is 1.4. Patient denies any chest pain but complains of shortness of breath.  She is requiring 1 L/min of oxygen Denies nausea vomiting or diarrhea Denies abdominal pain Complains of pain in the right lower extremity She recently had ABIs done on 01/10/2020 which were unremarkable     Review of systems:    In addition to the HPI above,    All other systems reviewed and are negative.    Past History of the following :    Past Medical History:  Diagnosis Date  . Anxiety   . Arthritis   . Asthma   . Cataracts, bilateral    removed by surgery  . COPD (chronic obstructive pulmonary disease) ([place])   . Depression   . Diabetes mellitus without complication ([place])    type 2  . Dyspnea    with exertion  . Fibromyalgia   . GERD  (gastroesophageal reflux disease)   . History of chemotherapy   . History of fractured pelvis   . Hypertension   . Hypothyroidism   . Osteoporosis 12/2017   T score -2.8 stable from prior DEXA  . Osteoporosis   . Ovarian cancer ([place])   . Pneumonia    several times      Past Surgical History:  Procedure Laterality Date  . ABDOMINAL HYSTERECTOMY  1996  . ANKLE FRACTURE SURGERY     plate and screws  . BIOPSY  02/05/2019   Procedure: BIOPSY;  Surgeon: Rush Landmark Telford Nab., MD;  Location: [facility];  Service: Gastroenterology;;  . BLADDER SUSPENSION    . CATARACT EXTRACTION    . COLONOSCOPY    . ESOPHAGEAL BRUSHING  02/05/2019   Procedure: ESOPHAGEAL BRUSHING;  Surgeon: Rush Landmark Telford Nab., MD;  Location: [facility];  Service: Gastroenterology;;  . ESOPHAGOGASTRODUODENOSCOPY (EGD) WITH PROPOFOL N/A 02/05/2019   Procedure: ESOPHAGOGASTRODUODENOSCOPY (EGD) WITH PROPOFOL with dialtion;  Surgeon: Irving Copas., MD;  Location: [facility];  Service: Gastroenterology;  Laterality: N/A;  .  EYE SURGERY     cataracts removed-bilateral  . hip surgey    . knee surgey    . LUNG SURGERY    . OOPHORECTOMY     BSO  . SAVORY DILATION N/A 02/05/2019   Procedure: SAVORY DILATION;  Surgeon: Rush Landmark Telford Nab., MD;  Location: [facility];  Service: Gastroenterology;  Laterality: N/A;  . TONSILLECTOMY    . UPPER GI ENDOSCOPY        Social History:      Social History   Tobacco Use  . Smoking status: Never Smoker  . Smokeless tobacco: Never Used  Substance Use Topics  . Alcohol use: No    Alcohol/week: 0.0 standard drinks       Family History :     Family History  Problem Relation Age of Onset  . Lung cancer Mother   . CAD Father   . Diabetes Father   . Lung cancer Maternal Aunt   . Diabetes Paternal Aunt   . Diabetes Paternal Uncle   . Diabetes Paternal Grandmother   . Diabetes Paternal Grandfather   . Colon cancer Neg Hx   . Esophageal cancer  Neg Hx   . Inflammatory bowel disease Neg Hx   . Liver disease Neg Hx   . Pancreatic cancer Neg Hx   . Rectal cancer Neg Hx   . Stomach cancer Neg Hx       Home Medications:   Prior to Admission medications   Medication Sig Start Date End Date Taking? Authorizing Provider  albuterol (PROAIR HFA) 108 (90 Base) MCG/ACT inhaler Inhale 2 puffs into the lungs every 6 (six) hours as needed for wheezing or shortness of breath. 08/27/19  Yes Young, Tarri Fuller D, MD  amitriptyline (ELAVIL) 10 MG tablet Take 1 tablet (10 mg total) by mouth at bedtime. 02/13/19  Yes [name], MD  baclofen (LIORESAL) 10 MG tablet TAKE 0.5-1 TABLET BY MOUTH TWICE DAILY AS NEEDED FOR MUSCLE SPASMS OR PAIN Patient taking differently: Take 5-10 mg by mouth 2 (two) times daily. TAKE 0.5-1 TABLET BY MOUTH TWICE DAILY AS NEEDED FOR MUSCLE SPASMS OR PAIN 03/19/19  Yes [name], MD  BD PEN NEEDLE NANO 2ND GEN 32G X 4 MM MISC USE AS DIRECTED 4 TIMES A DAY 10/30/19  Yes [name], MD  busPIRone (BUSPAR) 5 MG tablet TAKE 1 TO 2 TABLETS BY MOUTH TWICE A DAY Patient taking differently: Take 5-10 mg by mouth daily as needed. Anxiety 12/26/19  Yes Marrian Salvage, FNP  calcium carbonate (OSCAL) 1500 (600 Ca) MG TABS tablet Take 600 mg by mouth 2 (two) times daily with a meal.   Yes [provider]  carvedilol (COREG) 3.125 MG tablet Take 1 tablet (3.125 mg total) by mouth 2 (two) times daily with a meal. 07/02/19  Yes [name], MD  Cholecalciferol (VITAMIN D3) 2000 units TABS Take 2 tablets by mouth daily.    Yes [provider]  Dextromethorphan-Guaifenesin (MUCINEX DM MAXIMUM STRENGTH) 60-1200 MG TB12 Take 1 tablet by mouth 2 (two) times daily. Reported on 06/23/2015   Yes [provider]  DULoxetine (CYMBALTA) 30 MG capsule Take 1 capsule (30 mg total) by mouth daily. Plan to see her PCP for further refills 12/26/19  Yes Marrian Salvage, FNP  DULoxetine  (CYMBALTA) 60 MG capsule Take 1 capsule (60 mg total) by mouth daily. 12/26/19  Yes Marrian Salvage, FNP  ferrous gluconate (FERGON) 324 MG tablet TAKE 1 TABLET BY  MOUTH EVERY DAY WITH BREAKFAST Patient taking differently: Take 324 mg by mouth daily with breakfast.  06/27/19  Yes Mansouraty, Telford Nab., MD  Fluticasone-Umeclidin-Vilant (TRELEGY ELLIPTA) 100-62.5-25 MCG/INH AEPB Inhale 1 puff into the lungs daily. Rinse mouth 07/02/19  Yes Young, Tarri Fuller D, MD  furosemide (LASIX) 20 MG tablet Take 2 tablets (40 mg total) by mouth daily. 12/21/19  Yes Chandrasekhar, Mahesh A, MD  glucose blood (FREESTYLE TEST STRIPS) test strip Use to test blood sugar 3 times daily. Dx: E11.65 10/14/16  Yes Philemon Kingdom, MD  HYDROcodone-acetaminophen (NORCO) 10-325 MG tablet Take 1 tablet by mouth every 4 (four) hours as needed. 11/15/19  Yes [provider]  ipratropium-albuterol (DUONEB) 0.5-2.5 (3) MG/3ML SOLN USE 1 VIAL IN NEBULIZER 3 TIMES DAILY Patient taking differently: Inhale 3 mLs into the lungs 3 (three) times daily as needed (shortness of breath). USE 1 VIAL IN NEBULIZER 3 TIMES DAILY 11/25/19  Yes Young, Clinton D, MD  Lancets (FREESTYLE) lancets Use to test blood sugar 3 times daily. Dx: E11.65 10/14/16  Yes Philemon Kingdom, MD  LANTUS SOLOSTAR 100 UNIT/ML Solostar Pen INJECT 20 UNITS INTO THE SKIN EVERY MORNING. Patient taking differently: Inject 15 Units into the skin daily.  07/31/19  Yes Philemon Kingdom, MD  levothyroxine (SYNTHROID) 50 MCG tablet TAKE 1 TABLET BY MOUTH EVERY DAY BEFORE BREAKFAST Patient taking differently: Take 50 mcg by mouth daily before breakfast. TAKE 1 TABLET BY MOUTH EVERY DAY BEFORE BREAKFAST 12/13/19  Yes [name], MD  lidocaine (LIDODERM) 5 % Place 1 patch onto the skin every 12 (twelve) hours as needed. pain 06/12/19  Yes [provider]  lidocaine (XYLOCAINE) 5 % ointment Apply 1 application topically as needed. Apply to external vulvar  area 01/02/20  Yes Joseph Pierini, MD  magnesium oxide (MAG-OX) 400 MG tablet TAKE 1 TABLET BY MOUTH EVERY DAY Patient taking differently: Take 400 mg by mouth daily.  07/31/19  Yes [name], MD  methenamine (MANDELAMINE) 1 g tablet Take 1,000 mg by mouth 2 (two) times daily. Cont. 07/06/19  Yes [provider]  Misc Natural Products (TART CHERRY ADVANCED PO) Take by mouth. Once daily   Yes [provider]  montelukast (SINGULAIR) 10 MG tablet TAKE 1 TABLET BY MOUTH EVERY DAY Patient taking differently: Take 10 mg by mouth at bedtime.  10/30/19  Yes Young, Tarri Fuller D, MD  Multiple Vitamins-Minerals (CENTRUM ADULTS PO) Take 1 tablet by mouth daily.    Yes [provider]  omeprazole (PRILOSEC) 20 MG capsule TAKE 1 CAPSULE BY MOUTH 2 TIMES A DAY BEFORE A MEAL Patient taking differently: Take 20 mg by mouth 2 (two) times daily before a meal.  11/27/19  Yes Mansouraty, Telford Nab., MD  predniSONE (DELTASONE) 1 MG tablet Take 2 tablets (2 mg total) by mouth daily. 07/25/18  Yes [name], MD  rosuvastatin (CRESTOR) 10 MG tablet Take 1 tablet (10 mg total) by mouth every evening. 07/02/19  Yes [name], MD  UNABLE TO FIND Take 1,000 mg by mouth daily. Med Name: Tumeric   Yes [provider]  vitamin B-12 (CYANOCOBALAMIN) 250 MCG tablet Take 250 mcg by mouth See admin instructions. Twice weekly  ( Monday & Thursday )   Yes [provider]     Allergies:     Allergies  Allergen Reactions  . Latex Rash  . Penicillins Rash    Has patient had a PCN reaction causing immediate rash, facial/tongue/throat swelling, SOB or lightheadedness  with hypotension: No Has patient had a PCN reaction causing severe rash involving mucus membranes or skin necrosis: No Has patient had a PCN reaction that required hospitalization: No Has patient had a PCN reaction occurring within the last 10 years: No If all of the above answers are "NO", then  may proceed with Cephalosporin use.      Physical Exam:   Vitals  Blood pressure 109/68, pulse (!) 116, temperature (!) 101.5 F (38.6 C), temperature source Rectal, resp. rate (!) 30, SpO2 95 %.  1.  General: Appears in no acute distress  2. Psychiatric: Alert, oriented x2, she is oriented to place and person only  3. Neurologic: Cranial nerves II through XII grossly intact, no focal deficit noted  4. HEENMT:  Atraumatic normocephalic, extraocular muscles are intact  5. Respiratory : Bibasilar crackles auscultated  6. Cardiovascular : S1-S2, regular, no murmur auscultated  7. Gastrointestinal:  Abdomen is soft, nontender, no organomegaly  8. Skin:  No rashes noted  9.Musculoskeletal:  Bilateral 2+ pitting edema of the lower extremities, right lower extremity is warmer as compared to left lower extremity, no significant erythema noted, tender to palpation    Data Review:    CBC Recent Labs  Lab 01/12/20 1048  WBC 11.5*  HGB 16.6*  HCT 51.7*  PLT 137*  MCV 108.4*  MCH 34.8*  MCHC 32.1  RDW 13.6  LYMPHSABS 0.9  MONOABS 0.3  EOSABS 0.0  BASOSABS 0.0   ------------------------------------------------------------------------------------------------------------------  Results for orders placed or performed during the hospital encounter of 01/12/20 (from the past 48 hour(s))  POC CBG, ED     Status: Abnormal   Collection Time: 01/12/20 10:39 AM  Result Value Ref Range   Glucose-Capillary 182 (H) 70 - 99 mg/dL    Comment: Glucose reference range applies only to samples taken after fasting for at least 8 hours.  Basic metabolic panel     Status: Abnormal   Collection Time: 01/12/20 10:48 AM  Result Value Ref Range   Sodium 133 (L) 135 - 145 mmol/L   Potassium 4.3 3.5 - 5.1 mmol/L   Chloride 95 (L) 98 - 111 mmol/L   CO2 22 22 - 32 mmol/L   Glucose, Bld 200 (H) 70 - 99 mg/dL    Comment: Glucose reference range applies only to samples taken after fasting  for at least 8 hours.   BUN 29 (H) 8 - 23 mg/dL   Creatinine, Ser 1.39 (H) 0.44 - 1.00 mg/dL   Calcium 8.7 (L) 8.9 - 10.3 mg/dL   GFR, Estimated 38 (L) >60 mL/min    Comment: (NOTE) Calculated using the CKD-EPI Creatinine Equation (2021)    Anion gap 16 (H) 5 - 15    Comment: Performed at [facility], [place] [address], [place], [place] [address]  Brain natriuretic peptide     Status: Abnormal   Collection Time: 01/12/20 10:48 AM  Result Value Ref Range   B Natriuretic Peptide 264.6 (H) 0.0 - 100.0 pg/mL    Comment: Performed at [facility], [place] [address], [place], [place] [address]  Troponin I (High Sensitivity)     Status: Abnormal   Collection Time: 01/12/20 10:48 AM  Result Value Ref Range   Troponin I (High Sensitivity) 57 (H) <18 ng/L    Comment: (NOTE) Elevated high sensitivity troponin I (hsTnI) values and significant  changes across serial measurements may suggest ACS but many other  chronic and acute conditions are known to elevate hsTnI  results.  Refer to the "Links" section for chest pain algorithms and additional  guidance. Performed at [facility], [place] [address] [place], [place] [address]   CBC with Differential     Status: Abnormal   Collection Time: 01/12/20 10:48 AM  Result Value Ref Range   WBC 11.5 (H) 4.0 - 10.5 K/uL   RBC 4.77 3.87 - 5.11 MIL/uL   Hemoglobin 16.6 (H) 12.0 - 15.0 g/dL   HCT 51.7 (H) 36 - 46 %   MCV 108.4 (H) 80.0 - 100.0 fL   MCH 34.8 (H) 26.0 - 34.0 pg   MCHC 32.1 30.0 - 36.0 g/dL   RDW 13.6 11.5 - 15.5 %   Platelets 137 (L) 150 - 400 K/uL    Comment: Immature Platelet Fraction may be clinically indicated, consider ordering this additional test [id number]    nRBC 0.0 0.0 - 0.2 %   Neutrophils Relative % 88 %   Neutro Abs 10.2 (H) 1.7 - 7.7 K/uL   Lymphocytes Relative 8 %   Lymphs Abs 0.9 0.7 - 4.0 K/uL   Monocytes Relative 3 %   Monocytes Absolute 0.3 0.1 - 1.0 K/uL    Eosinophils Relative 0 %   Eosinophils Absolute 0.0 0.0 - 0.5 K/uL   Basophils Relative 0 %   Basophils Absolute 0.0 0.0 - 0.1 K/uL   Immature Granulocytes 1 %   Abs Immature Granulocytes 0.07 0.00 - 0.07 K/uL    Comment: Performed at [facility], [place] [address], [place], [place] [address]  Blood gas, venous (at [facility], not at [facility])     Status: Abnormal   Collection Time: 01/12/20 10:48 AM  Result Value Ref Range   FIO2 21.00    pH, Ven 7.413 7.25 - 7.43   pCO2, Ven 40.4 (L) 44 - 60 mmHg   pO2, Ven <31.0 (LL) 32 - 45 mmHg    Comment: CRITICAL RESULT CALLED TO, READ BACK BY AND VERIFIED WITH: JOSEPH,M. RN AT 1117 01/12/20 MULLINS,T    Bicarbonate 25.2 20.0 - 28.0 mmol/L   Acid-Base Excess 1.1 0.0 - 2.0 mmol/L   O2 Saturation 38.3 %   Patient temperature 98.6     Comment: Performed at [facility], [place] [address] [place], [place] [address]  Respiratory Panel by RT PCR (Flu A&B, Covid) - Nasopharyngeal Swab     Status: None   Collection Time: 01/12/20 10:49 AM   Specimen: Nasopharyngeal Swab  Result Value Ref Range   SARS Coronavirus 2 by RT PCR NEGATIVE NEGATIVE    Comment: (NOTE) SARS-CoV-2 target nucleic acids are NOT DETECTED.  The SARS-CoV-2 RNA is generally detectable in upper respiratoy specimens during the acute phase of infection. The lowest concentration of SARS-CoV-2 viral copies this assay can detect is 131 copies/mL. A negative result does not preclude SARS-Cov-2 infection and should not be used as the sole basis for treatment or other patient management decisions. A negative result may occur with  improper specimen collection/handling, submission of specimen other than nasopharyngeal swab, presence of viral mutation(s) within the areas targeted by this assay, and inadequate number of viral copies (<131 copies/mL). A negative result must be combined with clinical observations, patient history, and epidemiological  information. The expected result is Negative.  Fact Sheet for Patients:  PinkCheek.be  Fact Sheet for Healthcare Providers:  GravelBags.it  This test is no t yet approved or cleared by the [place] FDA and  has been authorized for detection  and/or diagnosis of SARS-CoV-2 by FDA under an Emergency Use Authorization (EUA). This EUA will remain  in effect (meaning this test can be used) for the duration of the COVID-19 declaration under Section 564(b)(1) of the Act, 21 U.S.C. section 360bbb-3(b)(1), unless the authorization is terminated or revoked sooner.     Influenza A by PCR NEGATIVE NEGATIVE   Influenza B by PCR NEGATIVE NEGATIVE    Comment: (NOTE) The Xpert Xpress SARS-CoV-2/FLU/RSV assay is intended as an aid in  the diagnosis of influenza from Nasopharyngeal swab specimens and  should not be used as a sole basis for treatment. Nasal washings and  aspirates are unacceptable for Xpert Xpress SARS-CoV-2/FLU/RSV  testing.  Fact Sheet for Patients: PinkCheek.be  Fact Sheet for Healthcare Providers: GravelBags.it  This test is not yet approved or cleared by the [place] FDA and  has been authorized for detection and/or diagnosis of SARS-CoV-2 by  FDA under an Emergency Use Authorization (EUA). This EUA will remain  in effect (meaning this test can be used) for the duration of the  Covid-19 declaration under Section 564(b)(1) of the Act, 21  U.S.C. section 360bbb-3(b)(1), unless the authorization is  terminated or revoked. Performed at [facility], [place] [address] [place], [place] [address]   Urinalysis, Routine w reflex microscopic Urine, Clean Catch     Status: Abnormal   Collection Time: 01/12/20 12:33 PM  Result Value Ref Range   Color, Urine YELLOW YELLOW   APPearance CLOUDY (A) CLEAR   Specific Gravity, Urine 1.018 1.005 -  1.030   pH 5.0 5.0 - 8.0   Glucose, UA NEGATIVE NEGATIVE mg/dL   Hgb urine dipstick MODERATE (A) NEGATIVE   Bilirubin Urine NEGATIVE NEGATIVE   Ketones, ur 20 (A) NEGATIVE mg/dL   Protein, ur 100 (A) NEGATIVE mg/dL   Nitrite POSITIVE (A) NEGATIVE   Leukocytes,Ua LARGE (A) NEGATIVE   RBC / HPF 11-20 0 - 5 RBC/hpf   WBC, UA >50 (H) 0 - 5 WBC/hpf   Bacteria, UA MANY (A) NONE SEEN   Squamous Epithelial / LPF 0-5 0 - 5    Comment: Performed at [facility], [place] [address], [place], [place] [address]  Lactic acid, plasma     Status: None   Collection Time: 01/12/20 12:33 PM  Result Value Ref Range   Lactic Acid, Venous 1.4 0.5 - 1.9 mmol/L    Comment: Performed at [facility], [place] [address], [place], [place] [address]  Troponin I (High Sensitivity)     Status: Abnormal   Collection Time: 01/12/20 12:39 PM  Result Value Ref Range   Troponin I (High Sensitivity) 69 (H) <18 ng/L    Comment: (NOTE) Elevated high sensitivity troponin I (hsTnI) values and significant  changes across serial measurements may suggest ACS but many other  chronic and acute conditions are known to elevate hsTnI results.  Refer to the "Links" section for chest pain algorithms and additional  guidance. Performed at [facility], [place] [address] [place], [place] [address]     Chemistries  Recent Labs  Lab 01/08/20 1344 01/12/20 1048  NA 138 133*  K 4.7 4.3  CL 100 95*  CO2 25 22  GLUCOSE 110* 200*  BUN 18 29*  CREATININE 0.87 1.39*  CALCIUM 9.0 8.7*  MG 2.1  --    ------------------------------------------------------------------------------------------------------------------  ------------------------------------------------------------------------------------------------------------------ GFR: Estimated Creatinine Clearance: 25.5 mL/min (A) (by C-G formula based on SCr of 1.39 mg/dL (H)). Liver Function  Tests: No results for input(s): AST,  ALT, ALKPHOS, BILITOT, PROT, ALBUMIN in the last 168 hours. No results for input(s): LIPASE, AMYLASE in the last 168 hours. No results for input(s): AMMONIA in the last 168 hours. Coagulation Profile: No results for input(s): INR, PROTIME in the last 168 hours. Cardiac Enzymes: No results for input(s): CKTOTAL, CKMB, CKMBINDEX, TROPONINI in the last 168 hours. BNP (last 3 results) Recent Labs    11/20/19 1549  PROBNP 58.0   HbA1C: No results for input(s): HGBA1C in the last 72 hours. CBG: Recent Labs  Lab 01/12/20 1039  GLUCAP 182*   Lipid Profile: No results for input(s): CHOL, HDL, LDLCALC, TRIG, CHOLHDL, LDLDIRECT in the last 72 hours. Thyroid Function Tests: No results for input(s): TSH, T4TOTAL, FREET4, T3FREE, THYROIDAB in the last 72 hours. Anemia Panel: No results for input(s): VITAMINB12, FOLATE, FERRITIN, TIBC, IRON, RETICCTPCT in the last 72 hours.  --------------------------------------------------------------------------------------------------------------- Urine analysis:    Component Value Date/Time   COLORURINE YELLOW 01/12/2020 1233   APPEARANCEUR CLOUDY (A) 01/12/2020 1233   LABSPEC 1.018 01/12/2020 1233   PHURINE 5.0 01/12/2020 1233   GLUCOSEU NEGATIVE 01/12/2020 1233   HGBUR MODERATE (A) 01/12/2020 1233   BILIRUBINUR NEGATIVE 01/12/2020 1233   BILIRUBINUR 1+ 06/13/2019 1531   KETONESUR 20 (A) 01/12/2020 1233   PROTEINUR 100 (A) 01/12/2020 1233   UROBILINOGEN 0.2 06/13/2019 1531   UROBILINOGEN 0.2 08/02/2014 0231   NITRITE POSITIVE (A) 01/12/2020 1233   LEUKOCYTESUR LARGE (A) 01/12/2020 1233      Imaging Results:    CT Head Wo Contrast  Result Date: 01/12/2020 CLINICAL DATA:  Altered mental status EXAM: CT HEAD WITHOUT CONTRAST TECHNIQUE: Contiguous axial images were obtained from the base of the skull through the vertex without intravenous contrast. COMPARISON:  12/24/2019 FINDINGS: Brain: No evidence of acute infarction, hemorrhage,  hydrocephalus, extra-axial collection or mass lesion/mass effect. Mild low-density changes within the periventricular and subcortical white matter compatible with chronic microvascular ischemic change. Mild diffuse cerebral volume loss. Vascular: Atherosclerotic calcifications involving the large vessels of the skull base. No unexpected hyperdense vessel. Skull: Normal. Negative for fracture or focal lesion. Sinuses/Orbits: No acute finding. Other: None. IMPRESSION: 1. No acute intracranial findings.  Stable exam from 12/24/2019. 2. Chronic microvascular ischemic change and cerebral volume loss. Electronically Signed   By: Davina Poke D.O.   On: 01/12/2020 12:17   DG Chest Port 1 View  Result Date: 01/12/2020 CLINICAL DATA:  Shortness of breath. EXAM: PORTABLE CHEST 1 VIEW COMPARISON:  November 20, 2019. FINDINGS: The heart size and mediastinal contours are within normal limits. Both lungs are clear. No pneumothorax or pleural effusion is noted. The visualized skeletal structures are unremarkable. IMPRESSION: No active disease. Aortic Atherosclerosis (ICD10-I70.0). Electronically Signed   By: Marijo Conception M.D.   On: 01/12/2020 11:30   LE ART SEG MULTI (Segm & LE Reynauds)  Result Date: 01/10/2020 LOWER EXTREMITY DOPPLER STUDY Indications: Peripheral artery disease. High Risk Factors: Hyperlipidemia, Diabetes, no history of smoking. Other Factors: Decreased pulses; complains of redness and swelling in her legs;                uses walker to ambulater. Denies any leg pain.  Performing Technologist: Wilkie Aye RVT  Examination Guidelines: A complete evaluation includes at minimum, Doppler waveform signals and systolic blood pressure reading at the level of bilateral brachial, anterior tibial, and posterior tibial arteries, when vessel segments are accessible. Bilateral testing is considered an integral part of a complete examination.  Photoelectric Plethysmograph (PPG) waveforms and toe systolic  pressure readings are included as required and additional duplex testing as needed. Limited examinations for reoccurring indications may be performed as noted.  ABI Findings: +---------+------------------+-----+---------+-----------------+ Right    Rt Pressure (mmHg)IndexWaveform Comment           +---------+------------------+-----+---------+-----------------+ Brachial 136                                               +---------+------------------+-----+---------+-----------------+ CFA                             triphasic                  +---------+------------------+-----+---------+-----------------+ Popliteal                       triphasic                  +---------+------------------+-----+---------+-----------------+ ATA      130               0.96 triphasic                  +---------+------------------+-----+---------+-----------------+ PTA      133               0.98 triphasic                  +---------+------------------+-----+---------+-----------------+ PERO     138               1.01 triphasic                  +---------+------------------+-----+---------+-----------------+ Great Toe61                0.45 Abnormal Warmed with towel +---------+------------------+-----+---------+-----------------+ +---------+------------------+-----+---------+-----------------+ Left     Lt Pressure (mmHg)IndexWaveform Comment           +---------+------------------+-----+---------+-----------------+ Brachial 136                                               +---------+------------------+-----+---------+-----------------+ CFA                             triphasic                  +---------+------------------+-----+---------+-----------------+ Popliteal                       triphasic                  +---------+------------------+-----+---------+-----------------+ ATA      142               1.04 biphasic                    +---------+------------------+-----+---------+-----------------+ PTA      138               1.01 triphasic                  +---------+------------------+-----+---------+-----------------+ PERO     140  1.03 biphasic                   +---------+------------------+-----+---------+-----------------+ Great Toe56                0.41 Abnormal Warmed with towel +---------+------------------+-----+---------+-----------------+ +-------+-----------+-----------+ ABI/TBIToday's ABIToday's TBI +-------+-----------+-----------+ Right  1.01       0.45        +-------+-----------+-----------+ Left   1.04       0.41        +-------+-----------+-----------+  Patient unable to keep limbs still; Multiphasic flow in both lower extremities.  Summary: Right: Resting right ankle-brachial index is within normal range. No evidence of significant right lower extremity arterial disease. The right toe-brachial index is abnormal. Left: Resting left ankle-brachial index is within normal range. No evidence of significant left lower extremity arterial disease. The left toe-brachial index is abnormal.  *See table(s) above for measurements and observations.  Electronically signed by Ena Dawley MD on 01/10/2020 at 45:31:43 PM.    Final     My personal review of EKG: Rhythm NSR, no ST-T changes   Assessment & Plan:    Active Problems:   Sepsis ([place])   1. Sepsis-patient presented with tachycardia, tachypnea, fever along with AKI and infectious source is abnormal UA.  Urine and blood cultures have been obtained.  Patient blood pressure is stable so did not receive 30 cc/kg fluid bolus, she also has history of underlying diastolic CHF currently requiring oxygen with bibasilar crackles.  Will cut down the LR to 100 cc/h.  Continue vancomycin and cefepime per pharmacy.  Lactic acid 1.4. 2. UTI-patient has abnormal UA, she has history of chronic UTIs in the past.  She has been started on cefepime  and vancomycin as above.  Follow urine culture results. 3. Right lower extremity pain-?  Cellulitis, continue vancomycin as above.  We will also obtain venous duplex of lower extremities to rule out underlying DVT.  She recently had ABIs done of lower extremity which was unremarkable. 4. Diabetes mellitus type 2-start moderate sliding scale insulin with NovoLog.  Check CBG before every meal and at bedtime.  Continue Lantus 15 units subcu daily. 5. History of COPD-patient on chronic prednisone therapy 2 mg daily for a long time.  Will hold prednisone for today.   and start hydrocortisone 50 mg IV every 6 hours x3 doses as stress dose.  Will obtain cortisol level.  Patient blood pressure is stable.  We will start prednisone 2 mg daily from tomorrow morning.  Continue Trelegy, DuoNeb 3 times daily. 6. Acute kidney injury 7. Patient presented with creatinine of 1.39, her baseline creatinine 0.87 as of 01/08/2020.  Likely from hypotension as well as diuretics.  Patient takes furosemide at home.  Will hold Lasix at this time, started on IV fluids as above.  Follow BMP in am. 8. Mild acute on chronic diastolic CHF-patient has grade 1 diastolic dysfunction, EF 63% as of echo from 01/08/2020.  She does have bibasilar crackles, she has been started on LR at 150 mL/h.  She already received LR bolus 250 cc x 1 in the ED.  We will have to be careful regarding aggressive fluid resuscitation in this patient.  As her blood pressure is stable.  Will change IV fluids to 50 mill per hour for 8 hours..  9. Hypothyroidism-continue Synthroid   DVT Prophylaxis-Heparin  AM Labs Ordered, also please review Full Orders  Family Communication: Admission, patients condition and plan of care including tests being ordered have  been discussed with the patient and her daughter at bedside who indicate understanding and agree with the plan and Code Status.  Code Status: DNR  Admission status: Inpatient :The appropriate admission  status for this patient is INPATIENT. Inpatient status is judged to be reasonable and necessary in order to provide the required intensity of service to ensure the patient's safety. The patient's presenting symptoms, physical exam findings, and initial radiographic and laboratory data in the context of their chronic comorbidities is felt to place them at high risk for further clinical deterioration. Furthermore, it is not anticipated that the patient will be medically stable for discharge from the hospital within 2 midnights of admission. The following factors support the admission status of inpatient.     The patient's presenting symptoms include altered mental status. The worrisome physical exam findings include right lower extremity tenderness, swelling. The initial radiographic and laboratory data are worrisome because of UTI. The chronic co-morbidities include diabetes mellitus type 2.       * I certify that at the point of admission it is my clinical judgment that the patient will require inpatient hospital care spanning beyond 2 midnights from the point of admission due to high intensity of service, high risk for further deterioration and high frequency of surveillance required.*  Time spent in minutes : 60 min   Jefferson Fullam S Pooja Camuso M.D

## 2020-01-13 ENCOUNTER — Inpatient Hospital Stay (HOSPITAL_COMMUNITY): Payer: Medicare Other

## 2020-01-13 DIAGNOSIS — A419 Sepsis, unspecified organism: Secondary | ICD-10-CM | POA: Diagnosis not present

## 2020-01-13 DIAGNOSIS — R609 Edema, unspecified: Secondary | ICD-10-CM | POA: Diagnosis not present

## 2020-01-13 LAB — BLOOD CULTURE ID PANEL (REFLEXED) - BCID2

## 2020-01-13 LAB — CBC WITH DIFFERENTIAL/PLATELET

## 2020-01-13 LAB — GLUCOSE, CAPILLARY
Glucose-Capillary: 181 mg/dL — ABNORMAL HIGH (ref 70–99)
Glucose-Capillary: 135 mg/dL — ABNORMAL HIGH (ref 70–99)
Glucose-Capillary: 213 mg/dL — ABNORMAL HIGH (ref 70–99)
Glucose-Capillary: 169 mg/dL — ABNORMAL HIGH (ref 70–99)

## 2020-01-13 LAB — COMPREHENSIVE METABOLIC PANEL
ALT: 21 U/L (ref 0–44)
AST: 31 U/L (ref 15–41)
Albumin: 2.9 g/dL — ABNORMAL LOW (ref 3.5–5.0)
Alkaline Phosphatase: 52 U/L (ref 38–126)
Anion gap: 10 (ref 5–15)
BUN: 31 mg/dL — ABNORMAL HIGH (ref 8–23)
CO2: 26 mmol/L (ref 22–32)
Calcium: 8.2 mg/dL — ABNORMAL LOW (ref 8.9–10.3)
Chloride: 100 mmol/L (ref 98–111)
Creatinine, Ser: 1.51 mg/dL — ABNORMAL HIGH (ref 0.44–1.00)
GFR, Estimated: 34 mL/min — ABNORMAL LOW (ref >60)
Glucose, Bld: 197 mg/dL — ABNORMAL HIGH (ref 70–99)
Potassium: 3.9 mmol/L (ref 3.5–5.1)
Sodium: 136 mmol/L (ref 135–145)
Total Bilirubin: 0.9 mg/dL (ref 0.3–1.2)
Total Protein: 5.7 g/dL — ABNORMAL LOW (ref 6.5–8.1)

## 2020-01-13 LAB — CBC
HCT: 29.6 % — ABNORMAL LOW (ref 36.0–46.0)
HCT: 31 % — ABNORMAL LOW (ref 36.0–46.0)
Hemoglobin: 9.4 g/dL — ABNORMAL LOW (ref 12.0–15.0)
Hemoglobin: 9.9 g/dL — ABNORMAL LOW (ref 12.0–15.0)
MCH: 35.7 pg — ABNORMAL HIGH (ref 26.0–34.0)
MCH: 35.6 pg — ABNORMAL HIGH (ref 26.0–34.0)
MCHC: 31.8 g/dL (ref 30.0–36.0)
MCHC: 31.9 g/dL (ref 30.0–36.0)
MCV: 112.5 fL — ABNORMAL HIGH (ref 80.0–100.0)
MCV: 111.5 fL — ABNORMAL HIGH (ref 80.0–100.0)
Platelets: 135 10*3/uL — ABNORMAL LOW (ref 150–400)
Platelets: 140 10*3/uL — ABNORMAL LOW (ref 150–400)
RBC: 2.63 MIL/uL — ABNORMAL LOW (ref 3.87–5.11)
RBC: 2.78 MIL/uL — ABNORMAL LOW (ref 3.87–5.11)
RDW: 13.8 % (ref 11.5–15.5)
RDW: 13.7 % (ref 11.5–15.5)
WBC: 17.2 10*3/uL — ABNORMAL HIGH (ref 4.0–10.5)
WBC: 18.9 10*3/uL — ABNORMAL HIGH (ref 4.0–10.5)
nRBC: 0 % (ref 0.0–0.2)
nRBC: 0 % (ref 0.0–0.2)

## 2020-01-13 LAB — PROTIME-INR
INR: 1.1 (ref 0.8–1.2)
Prothrombin Time: 13.7 seconds (ref 11.4–15.2)

## 2020-01-13 LAB — PROCALCITONIN: Procalcitonin: 59.59 ng/mL

## 2020-01-13 MED ORDER — SODIUM CHLORIDE 0.9 % IV SOLN
1.0000 g | Freq: Two times a day (BID) | INTRAVENOUS | Status: AC
  Administered 2020-01-13 – 2020-01-14 (×3): 1 g via INTRAVENOUS
  Filled 2020-01-13 (×3): qty 1

## 2020-01-13 MED ORDER — LACTATED RINGERS IV SOLN: INTRAVENOUS | Status: DC

## 2020-01-13 MED ORDER — ROSUVASTATIN CALCIUM 10 MG PO TABS
10.0000 mg | ORAL_TABLET | Freq: Every evening | ORAL | Status: DC
  Administered 2020-01-13 – 2020-01-15 (×3): 10 mg via ORAL
  Filled 2020-01-13 (×3): qty 1

## 2020-01-13 MED ORDER — DULOXETINE HCL 60 MG PO CPEP
60.0000 mg | ORAL_CAPSULE | Freq: Once | ORAL | Status: AC
  Administered 2020-01-13: 60 mg via ORAL
  Filled 2020-01-13: qty 1

## 2020-01-13 MED ORDER — VITAMIN D 25 MCG (1000 UNIT) PO TABS
4000.0000 [IU] | ORAL_TABLET | Freq: Every day | ORAL | Status: DC
  Administered 2020-01-13 – 2020-01-15 (×3): 4000 [IU] via ORAL
  Filled 2020-01-13 (×3): qty 4

## 2020-01-13 MED ORDER — CALCIUM CARBONATE 1250 (500 CA) MG PO TABS
1.0000 | ORAL_TABLET | Freq: Two times a day (BID) | ORAL | Status: DC
  Administered 2020-01-13 – 2020-01-15 (×5): 500 mg via ORAL
  Filled 2020-01-13 (×5): qty 1

## 2020-01-13 MED ORDER — DULOXETINE HCL 60 MG PO CPEP
90.0000 mg | ORAL_CAPSULE | Freq: Every day | ORAL | Status: DC
  Administered 2020-01-14 – 2020-01-15 (×2): 90 mg via ORAL
  Filled 2020-01-13 (×2): qty 1

## 2020-01-13 MED ORDER — SODIUM CHLORIDE 0.9 % IV SOLN
1.0000 g | INTRAVENOUS | Status: AC
  Administered 2020-01-13: 1 g via INTRAVENOUS
  Filled 2020-01-13: qty 1

## 2020-01-13 MED ORDER — PANTOPRAZOLE SODIUM 40 MG PO TBEC
40.0000 mg | DELAYED_RELEASE_TABLET | Freq: Every day | ORAL | Status: DC
  Administered 2020-01-13 – 2020-01-15 (×3): 40 mg via ORAL
  Filled 2020-01-13 (×3): qty 1

## 2020-01-13 MED ORDER — MAGNESIUM OXIDE 400 (241.3 MG) MG PO TABS
400.0000 mg | ORAL_TABLET | Freq: Every day | ORAL | Status: DC
  Administered 2020-01-13 – 2020-01-15 (×3): 400 mg via ORAL
  Filled 2020-01-13 (×3): qty 1

## 2020-01-13 NOTE — Plan of Care (Signed)

## 2020-01-13 NOTE — Progress Notes (Addendum)
PROGRESS NOTE   Kayla Thompson  EUM:[id number]    DOB: [birth year]    DOA: 01/12/2020  PCP: Marrian Salvage, FNP   I have briefly reviewed patients previous medical records in [facility].  Chief complaint: Altered mental status.  Brief Narrative:  84 year old female, lives alone at home, has an aide who assists her during the day and her daughter stays with her at night, ambulates with the help of a walker, PMH of asthma/COPD on as needed home oxygen,?  Rheumatoid arthritis, chronic prednisone use, anxiety and depression, type II DM, GERD, HTN, hypothyroid, ovarian cancer, chronic UTI on suppressive antibiotic treatments, recurrent pneumonias, recently diagnosed lower extremity edema/chronic diastolic CHF, presented to the ED with complaints of confusion and weakness and admitted for sepsis due to acute lower UTI with ESBL E. coli bacteremia.  Improving   Assessment & Plan:  Active Problems:   Sepsis ([place])   Sepsis due to acute lower UTI with ESBL E. coli bacteremia, POA: Met sepsis criteria on admission including fever of 101.5, tachypnea in the 20s-30s, tachycardia in the 110s, hypoxia requiring oxygen in the ED, leukocytosis and urine microscopy suggestive of UTI.  Lactate normal.  Procalcitonin: 59.59.  Random cortisol: 15.8 deceived IV fluid bolus per sepsis protocol but aggressive IV fluids was not continued due to concern for CHF and related hypoxia.  Had been empirically started on cefepime, metronidazole and vancomycin.  Blood cultures x2: Gram-negative rods, follow final results. BCID: ESBL E. coli.  Urine culture pending.  No clinical concern for cellulitis at this time.  Thereby communicated with pharmacy and changed antibiotics to meropenem alone.  Stopped vancomycin and the rest.  Sepsis physiology has resolved.  Acute kidney injury: Likely due to sepsis, some dehydration and recent Lasix use.  Temporarily hold Lasix.  Not overtly volume overloaded.  Creatinine has  gone from 0.87 on 10/19-1.39 on admission to 1.51 today.  Bladder scan: 175 mL/no urinary retention.  Hold Lasix.  Avoid nephrotoxic's.  Gentle IV fluids for 24 hours and follow BMP.  Acute metabolic encephalopathy: Secondary to sepsis and AKI.  CT head without acute findings.  As per granddaughter at bedside, mental status significantly improved.  May have resolved.  Monitor.  Lower extremity edema with concern for venous insufficiency/concern for HFpEF Seen by Dr. Gasper Sells, Cardioloogy on 12/21/2019.  He indicated lower extremity edema with concern for venous insufficiency.  TTE: VQMG-86%, grade 1 diastolic dysfunction, no mitral valve stenosis and has mild aortic valve stenosis.  Bilateral lower extremity arterial Dopplers: ABI within normal limits.  Bilateral toe brachial index abnormal.  Has not started using compression stockings, placed compression stockings here.  Hold Lasix that had been initiated due to AKI and clinically does not appear to be in overt CHF at this time.  He indicated that if there was no improvement in follow-up then would send to vein/vascular specialist for evaluation.  Follow-up on LEV Dopplers to rule out DVT, for completion.  No clinical features suggestive of cellulitis and hence discontinued vancomycin.  Anemia: Hemoglobin 9.9, 11.5 in September, may be related to acute illness or hemodilution.  No bleeding reported.  Follow CBC in a.m.  Thrombocytopenia: No bleeding reported.  Could be related to sepsis.  Follow CBC in a.m.  Type II DM with hyperglycemia: Mildly uncontrolled, probably from IV hydrocortisone she got on admission.  Continue Lantus, SSI and adjust insulins as needed.  Essential hypertension/hypotension: Was hypotensive overnight, s/p stress dose IV hydrocortisone, IV fluid bolus.  Blood  pressures have improved.  Continue gentle IV fluid hydration with close monitoring. Holding carvedilol at this time.  Asthma/COPD, steroid-dependent and as  needed oxygen at home Follows with Dr. Baird Lyons, pulmonology.  On chronic prednisone 2 mg daily.  Received IV hydrocortisone 50 mg every 6 hours x3 doses/stress steroids on admission.  Now back on home prednisone.  No clinical bronchospasm.  Continue Trelegy and DuoNeb's.  Hypothyroidism: Continue Synthroid.  ?  Rheumatoid arthritis Patient reports that she follows with Dr. Kathlene November, Rheumatology and feels that she was told that she has?  Rheumatoid arthritis and may be on prednisone for this rather than pulmonary indication.  No acute flare.  Left shoulder pain, chronic States that she has rotator cuff issues, was supposed to have surgery in September by [facility] orthopedics but was postponed due to COVID-19 pandemic.  Outpatient follow-up.  Anxiety and depression: Continue prior home dose of Cymbalta 90 mg daily, amitriptyline, as needed BuSpar.  Body mass index is 27.43 kg/m.  Nutritional Status        DVT prophylaxis: enoxaparin (LOVENOX) injection 30 mg Start: 01/12/20 2200     Code Status: DNR Family Communication: Discussed in detail with patient's granddaughter at bedside, updated care and answered questions. Disposition:  Status is: Inpatient  Remains inpatient appropriate because:Inpatient level of care appropriate due to severity of illness   Dispo: The patient is from: Home              Anticipated d/c is to: Home              Anticipated d/c date is: 3 days              Patient currently is not medically stable to d/c.        Consultants:   None  Procedures:   None  Antimicrobials:    Anti-infectives (From admission, onward)   Start     Dose/Rate Route Frequency Ordered Stop   01/13/20 2200  meropenem (MERREM) 1 g in sodium chloride 0.9 % 100 mL IVPB        1 g 200 mL/hr over 30 Minutes Intravenous Every 12 hours 01/13/20 1056     01/13/20 1400  vancomycin (VANCOREADY) IVPB 500 mg/100 mL  Status:  Discontinued        500 mg 100 mL/hr over 60  Minutes Intravenous Every 24 hours 01/12/20 1648 01/13/20 1042   01/13/20 1200  ceFEPIme (MAXIPIME) 2 g in sodium chloride 0.9 % 100 mL IVPB  Status:  Discontinued        2 g 200 mL/hr over 30 Minutes Intravenous Every 24 hours 01/12/20 1648 01/13/20 1042   01/13/20 1130  meropenem (MERREM) 1 g in sodium chloride 0.9 % 100 mL IVPB        1 g 200 mL/hr over 30 Minutes Intravenous STAT 01/13/20 1044 01/14/20 1130   01/12/20 1145  vancomycin (VANCOREADY) IVPB 1250 mg/250 mL        1,250 mg 166.7 mL/hr over 90 Minutes Intravenous  Once 01/12/20 1130 01/12/20 1711   01/12/20 1145  ceFEPIme (MAXIPIME) 2 g in sodium chloride 0.9 % 100 mL IVPB        2 g 200 mL/hr over 30 Minutes Intravenous STAT 01/12/20 1132 01/12/20 1351   01/12/20 1130  aztreonam (AZACTAM) 2 g in sodium chloride 0.9 % 100 mL IVPB  Status:  Discontinued        2 g 200 mL/hr over 30 Minutes Intravenous  Once 01/12/20 1120  01/12/20 1132   01/12/20 1130  metroNIDAZOLE (FLAGYL) IVPB 500 mg        500 mg 100 mL/hr over 60 Minutes Intravenous  Once 01/12/20 1120 01/12/20 1513   01/12/20 1130  vancomycin (VANCOCIN) IVPB 1000 mg/200 mL premix  Status:  Discontinued        1,000 mg 200 mL/hr over 60 Minutes Intravenous  Once 01/12/20 1120 01/12/20 1130        Subjective:  Patient's granddaughter at bedside provided substantial history.  She has access to cameras installed in her grandmother's house.  On morning of 10/23, she noted on camera that patient was confused and had called life alert.  When she went to the patient's house, patient was in her bed, confused, noted that she had swelling of her lower extremities with bluish discoloration.  She tried to move her to a chair and elevate her legs but could not manage this by herself.  She then noticed that patient started to have some dyspnea, upper extremity and?  Lips bluish discoloration following which she activated EMS.  She reports patient looks much better today.  Patient  states that she feels much better.  Denies any specific complaints.  Denies dyspnea, chest pain, dizziness, lightheadedness.  Denies dysuria which is not unusual for her when she has a UTI that she tends to get confused and have a fever.  Objective:   Vitals:   01/12/20 2243 01/12/20 2339 01/13/20 0348 01/13/20 0739  BP: (!) 83/55 (!) 82/56 (!) 108/59 100/70  Pulse: 86 77 66 64  Resp: (!) $RemoveB'21 20 20 14  'aigveoZA$ Temp: 97.7 F (36.5 C) (!) 97.5 F (36.4 C) (!) 96.4 F (35.8 C) 97.6 F (36.4 C)  TempSrc: Oral Oral Axillary Oral  SpO2: 98% 98% 98%   Weight:      Height:        General exam: Pleasant elderly female, moderately built and nourished lying comfortably propped up in bed without distress.  Oral mucosa with borderline hydration. Respiratory system: Clear to auscultation. Respiratory effort normal. Cardiovascular system: S1 & S2 heard, RRR. No JVD, murmurs, rubs, gallops or clicks.  Trace bilateral ankle edema.  Telemetry personally reviewed: Sinus rhythm. Gastrointestinal system: Abdomen is nondistended, soft and nontender. No organomegaly or masses felt. Normal bowel sounds heard. Central nervous system: Alert and oriented to person, place and partly to time. No focal neurological deficits. Extremities: Symmetric 5 x 5 power. Skin: No rashes, lesions or ulcers Psychiatry: Judgement and insight appear normal. Mood & affect pleasant and appropriate.     Data Reviewed:   I have personally reviewed following labs and imaging studies   CBC: Recent Labs  Lab 01/12/20 1048 01/13/20 0519 01/13/20 0726  WBC QUESTIONABLE RESULTS, RECOMMEND RECOLLECT TO VERIFY 17.2* 18.9*  NEUTROABS QUESTIONABLE RESULTS, RECOMMEND RECOLLECT TO VERIFY  --   --   HGB QUESTIONABLE RESULTS, RECOMMEND RECOLLECT TO VERIFY 9.4* 9.9*  HCT QUESTIONABLE RESULTS, RECOMMEND RECOLLECT TO VERIFY 29.6* 31.0*  MCV QUESTIONABLE RESULTS, RECOMMEND RECOLLECT TO VERIFY 112.5* 111.5*  PLT QUESTIONABLE RESULTS, RECOMMEND  RECOLLECT TO VERIFY 135* 140*    Basic Metabolic Panel: Recent Labs  Lab 01/08/20 1344 01/12/20 1048 01/12/20 1513 01/13/20 0519  NA 138 133*  --  136  K 4.7 4.3  --  3.9  CL 100 95*  --  100  CO2 25 22  --  26  GLUCOSE 110* 200*  --  197*  BUN 18 29*  --  31*  CREATININE 0.87 1.39*  --  1.51*  CALCIUM 9.0 8.7*  --  8.2*  MG 2.1  --  2.0  --     Liver Function Tests: Recent Labs  Lab 01/13/20 0519  AST 31  ALT 21  ALKPHOS 52  BILITOT 0.9  PROT 5.7*  ALBUMIN 2.9*    CBG: Recent Labs  Lab 01/12/20 1708 01/12/20 2028 01/13/20 0736  GLUCAP 120* 203* 181*    Microbiology Studies:   Recent Results (from the past 240 hour(s))  Respiratory Panel by RT PCR (Flu A&B, Covid) - Nasopharyngeal Swab     Status: None   Collection Time: 01/12/20 10:49 AM   Specimen: Nasopharyngeal Swab  Result Value Ref Range Status   SARS Coronavirus 2 by RT PCR NEGATIVE NEGATIVE Final    Comment: (NOTE) SARS-CoV-2 target nucleic acids are NOT DETECTED.  The SARS-CoV-2 RNA is generally detectable in upper respiratoy specimens during the acute phase of infection. The lowest concentration of SARS-CoV-2 viral copies this assay can detect is 131 copies/mL. A negative result does not preclude SARS-Cov-2 infection and should not be used as the sole basis for treatment or other patient management decisions. A negative result may occur with  improper specimen collection/handling, submission of specimen other than nasopharyngeal swab, presence of viral mutation(s) within the areas targeted by this assay, and inadequate number of viral copies (<131 copies/mL). A negative result must be combined with clinical observations, patient history, and epidemiological information. The expected result is Negative.  Fact Sheet for Patients:  PinkCheek.be  Fact Sheet for Healthcare Providers:  GravelBags.it  This test is no t yet approved or  cleared by the [place] FDA and  has been authorized for detection and/or diagnosis of SARS-CoV-2 by FDA under an Emergency Use Authorization (EUA). This EUA will remain  in effect (meaning this test can be used) for the duration of the COVID-19 declaration under Section 564(b)(1) of the Act, 21 U.S.C. section 360bbb-3(b)(1), unless the authorization is terminated or revoked sooner.     Influenza A by PCR NEGATIVE NEGATIVE Final   Influenza B by PCR NEGATIVE NEGATIVE Final    Comment: (NOTE) The Xpert Xpress SARS-CoV-2/FLU/RSV assay is intended as an aid in  the diagnosis of influenza from Nasopharyngeal swab specimens and  should not be used as a sole basis for treatment. Nasal washings and  aspirates are unacceptable for Xpert Xpress SARS-CoV-2/FLU/RSV  testing.  Fact Sheet for Patients: PinkCheek.be  Fact Sheet for Healthcare Providers: GravelBags.it  This test is not yet approved or cleared by the [place] FDA and  has been authorized for detection and/or diagnosis of SARS-CoV-2 by  FDA under an Emergency Use Authorization (EUA). This EUA will remain  in effect (meaning this test can be used) for the duration of the  Covid-19 declaration under Section 564(b)(1) of the Act, 21  U.S.C. section 360bbb-3(b)(1), unless the authorization is  terminated or revoked. Performed at [facility], [place] [address] [place], [place] [address]   Blood culture (routine x 2)     Status: None (Preliminary result)   Collection Time: 01/12/20 11:29 AM   Specimen: BLOOD  Result Value Ref Range Status   Specimen Description   Final    BLOOD RIGHT ANTECUBITAL Performed at [place] [address] [place],  [address]    Special Requests   Final    BOTTLES DRAWN AEROBIC AND ANAEROBIC Blood Culture adequate volume Performed at [place] [address]  [place], [place]  [address]    Culture  Setup Time   Final    GRAM NEGATIVE RODS ANAEROBIC BOTTLE ONLY Organism ID to follow CRITICAL RESULT CALLED TO, READ BACK BY AND VERIFIED WITH: PHARMD Melodye Ped [id number] MLM Performed at [facility], [place] [address] [place], [place] [address]    Culture GRAM NEGATIVE RODS  Final   Report Status PENDING  Incomplete  Blood Culture ID Panel (Reflexed)     Status: Abnormal   Collection Time: 01/12/20 11:29 AM  Result Value Ref Range Status   Enterococcus faecalis NOT DETECTED NOT DETECTED Final   Enterococcus Faecium NOT DETECTED NOT DETECTED Final   Listeria monocytogenes NOT DETECTED NOT DETECTED Final   Staphylococcus species NOT DETECTED NOT DETECTED Final   Staphylococcus aureus (BCID) NOT DETECTED NOT DETECTED Final   Staphylococcus epidermidis NOT DETECTED NOT DETECTED Final   Staphylococcus lugdunensis NOT DETECTED NOT DETECTED Final   Streptococcus species NOT DETECTED NOT DETECTED Final   Streptococcus agalactiae NOT DETECTED NOT DETECTED Final   Streptococcus pneumoniae NOT DETECTED NOT DETECTED Final   Streptococcus pyogenes NOT DETECTED NOT DETECTED Final   A.calcoaceticus-baumannii NOT DETECTED NOT DETECTED Final   Bacteroides fragilis NOT DETECTED NOT DETECTED Final   Enterobacterales DETECTED (A) NOT DETECTED Final    Comment: Enterobacterales represent a large order of gram negative bacteria, not a single organism. CRITICAL RESULT CALLED TO, READ BACK BY AND VERIFIED WITH: PHARMD J GADHIA [id number] MLM    Enterobacter cloacae complex NOT DETECTED NOT DETECTED Final   Escherichia coli DETECTED (A) NOT DETECTED Final    Comment: CRITICAL RESULT CALLED TO, READ BACK BY AND VERIFIED WITH: PHARMD J GADHIA [id number] MLM    Klebsiella aerogenes NOT DETECTED NOT DETECTED Final   Klebsiella oxytoca NOT DETECTED NOT DETECTED Final   Klebsiella pneumoniae NOT DETECTED NOT DETECTED Final   Proteus species NOT DETECTED NOT DETECTED  Final   Salmonella species NOT DETECTED NOT DETECTED Final   Serratia marcescens NOT DETECTED NOT DETECTED Final   Haemophilus influenzae NOT DETECTED NOT DETECTED Final   Neisseria meningitidis NOT DETECTED NOT DETECTED Final   Pseudomonas aeruginosa NOT DETECTED NOT DETECTED Final   Stenotrophomonas maltophilia NOT DETECTED NOT DETECTED Final   Candida albicans NOT DETECTED NOT DETECTED Final   Candida auris NOT DETECTED NOT DETECTED Final   Candida glabrata NOT DETECTED NOT DETECTED Final   Candida krusei NOT DETECTED NOT DETECTED Final   Candida parapsilosis NOT DETECTED NOT DETECTED Final   Candida tropicalis NOT DETECTED NOT DETECTED Final   Cryptococcus neoformans/gattii NOT DETECTED NOT DETECTED Final   CTX-M ESBL DETECTED (A) NOT DETECTED Final    Comment: CRITICAL RESULT CALLED TO, READ BACK BY AND VERIFIED WITH: PHARMD J GADHIA [id number] MLM (NOTE) Extended spectrum beta-lactamase detected. Recommend a carbapenem as initial therapy.      Carbapenem resistance IMP NOT DETECTED NOT DETECTED Final   Carbapenem resistance KPC NOT DETECTED NOT DETECTED Final   Carbapenem resistance NDM NOT DETECTED NOT DETECTED Final   Carbapenem resist OXA 48 LIKE NOT DETECTED NOT DETECTED Final   Carbapenem resistance VIM NOT DETECTED NOT DETECTED Final    Comment: Performed at [facility], [place] [address], [place], [place] [address]  Blood culture (routine x 2)     Status: None (Preliminary result)   Collection Time: 01/12/20 12:33 PM   Specimen: BLOOD LEFT FOREARM  Result Value Ref Range Status   Specimen Description   Final  BLOOD LEFT FOREARM Performed at [facility], [place] [address] [place], [place] [address]    Special Requests   Final    BOTTLES DRAWN AEROBIC AND ANAEROBIC Blood Culture adequate volume Performed at [place] [address] [place], [place] [address]    Culture  Setup Time   Final    GRAM NEGATIVE RODS AEROBIC BOTTLE  ONLY Performed at [facility], [place] [address], [place], [place] [address]    Culture GRAM NEGATIVE RODS  Final   Report Status PENDING  Incomplete     Radiology Studies:  CT Head Wo Contrast  Result Date: 01/12/2020 CLINICAL DATA:  Altered mental status EXAM: CT HEAD WITHOUT CONTRAST TECHNIQUE: Contiguous axial images were obtained from the base of the skull through the vertex without intravenous contrast. COMPARISON:  12/24/2019 FINDINGS: Brain: No evidence of acute infarction, hemorrhage, hydrocephalus, extra-axial collection or mass lesion/mass effect. Mild low-density changes within the periventricular and subcortical white matter compatible with chronic microvascular ischemic change. Mild diffuse cerebral volume loss. Vascular: Atherosclerotic calcifications involving the large vessels of the skull base. No unexpected hyperdense vessel. Skull: Normal. Negative for fracture or focal lesion. Sinuses/Orbits: No acute finding. Other: None. IMPRESSION: 1. No acute intracranial findings.  Stable exam from 12/24/2019. 2. Chronic microvascular ischemic change and cerebral volume loss. Electronically Signed   By: Davina Poke D.O.   On: 01/12/2020 12:17   DG Chest Port 1 View  Result Date: 01/12/2020 CLINICAL DATA:  Shortness of breath. EXAM: PORTABLE CHEST 1 VIEW COMPARISON:  November 20, 2019. FINDINGS: The heart size and mediastinal contours are within normal limits. Both lungs are clear. No pneumothorax or pleural effusion is noted. The visualized skeletal structures are unremarkable. IMPRESSION: No active disease. Aortic Atherosclerosis (ICD10-I70.0). Electronically Signed   By: Marijo Conception M.D.   On: 01/12/2020 11:30     Scheduled Meds:   . amitriptyline  10 mg Oral QHS  . baclofen  5-10 mg Oral BID  . DULoxetine  30 mg Oral Daily  . enoxaparin (LOVENOX) injection  30 mg Subcutaneous Q24H  . ferrous gluconate  324 mg Oral Q breakfast  . umeclidinium bromide  1 puff  Inhalation Daily   And  . fluticasone furoate-vilanterol  1 puff Inhalation Daily  . insulin aspart  0-15 Units Subcutaneous TID WC  . insulin aspart  0-5 Units Subcutaneous QHS  . insulin glargine  15 Units Subcutaneous Daily  . levothyroxine  50 mcg Oral QAC breakfast  . montelukast  10 mg Oral QHS  . predniSONE  2 mg Oral Daily    Continuous Infusions:   . meropenem (MERREM) IV    . meropenem (MERREM) IV       LOS: 1 day     Vernell Leep, MD, [place], [facility]. [facility]    To contact the attending provider between 7A-7P or the covering provider during after hours 7P-7A, please log into the web site [email or web address] and access using universal [place] password for that web site. If you do not have the password, please call the hospital operator.  01/13/2020, 11:07 AM

## 2020-01-13 NOTE — Plan of Care (Signed)
  Problem: Education: Goal: Knowledge of General Education information will improve Description: Including pain rating scale, medication(s)/side effects and non-pharmacologic comfort measures Outcome: Progressing   Problem: Health Behavior/Discharge Planning: Goal: Ability to manage health-related needs will improve Outcome: Progressing   Problem: Clinical Measurements: Goal: Ability to maintain clinical measurements within normal limits will improve Outcome: Progressing Goal: Will remain free from infection Outcome: Progressing Goal: Diagnostic test results will improve Outcome: Progressing Goal: Respiratory complications will improve Outcome: Progressing Goal: Cardiovascular complication will be avoided Outcome: Progressing   Problem: Nutrition: Goal: Adequate nutrition will be maintained Outcome: Progressing   Problem: Coping: Goal: Level of anxiety will decrease Outcome: Progressing   Problem: Pain Managment: Goal: General experience of comfort will improve Outcome: Progressing   Problem: Safety: Goal: Ability to remain free from injury will improve Outcome: Progressing   

## 2020-01-13 NOTE — Progress Notes (Signed)
Bilateral lower extremity venous duplex completed. Refer to "CV Proc" under chart review to view preliminary results.  01/13/2020 4:05 PM Kelby Aline., MHA, RVT, RDCS, RDMS

## 2020-01-13 NOTE — Progress Notes (Signed)
PHARMACY - PHYSICIAN COMMUNICATION CRITICAL VALUE ALERT - BLOOD CULTURE IDENTIFICATION (BCID)  Kayla Thompson is an 84 y.o. female who presented to [facility] on 01/12/2020 with a chief complaint of lethargy, altered mental status.   Assessment: sepsis secondary to UTI. Per MD, no evidence of cellulitis.   Name of physician (or Provider) Contacted: Dr. Algis Liming  Current antibiotics: Vancomycin, Cefepime  Changes to prescribed antibiotics recommended:  Recommendations accepted by provider  Discontinue Vancomycin and Cefepime. Start Meropenem per pharmacy dosing.  Will dose Meropenem at 1g IV q12h and follow renal function, cultures.   Results for orders placed or performed during the hospital encounter of 01/12/20  Blood Culture ID Panel (Reflexed) (Collected: 01/12/2020 11:29 AM)  Result Value Ref Range   Enterococcus faecalis NOT DETECTED NOT DETECTED   Enterococcus Faecium NOT DETECTED NOT DETECTED   Listeria monocytogenes NOT DETECTED NOT DETECTED   Staphylococcus species NOT DETECTED NOT DETECTED   Staphylococcus aureus (BCID) NOT DETECTED NOT DETECTED   Staphylococcus epidermidis NOT DETECTED NOT DETECTED   Staphylococcus lugdunensis NOT DETECTED NOT DETECTED   Streptococcus species NOT DETECTED NOT DETECTED   Streptococcus agalactiae NOT DETECTED NOT DETECTED   Streptococcus pneumoniae NOT DETECTED NOT DETECTED   Streptococcus pyogenes NOT DETECTED NOT DETECTED   A.calcoaceticus-baumannii NOT DETECTED NOT DETECTED   Bacteroides fragilis NOT DETECTED NOT DETECTED   Enterobacterales DETECTED (A) NOT DETECTED   Enterobacter cloacae complex NOT DETECTED NOT DETECTED   Escherichia coli DETECTED (A) NOT DETECTED   Klebsiella aerogenes NOT DETECTED NOT DETECTED   Klebsiella oxytoca NOT DETECTED NOT DETECTED   Klebsiella pneumoniae NOT DETECTED NOT DETECTED   Proteus species NOT DETECTED NOT DETECTED   Salmonella species NOT DETECTED NOT DETECTED   Serratia marcescens NOT  DETECTED NOT DETECTED   Haemophilus influenzae NOT DETECTED NOT DETECTED   Neisseria meningitidis NOT DETECTED NOT DETECTED   Pseudomonas aeruginosa NOT DETECTED NOT DETECTED   Stenotrophomonas maltophilia NOT DETECTED NOT DETECTED   Candida albicans NOT DETECTED NOT DETECTED   Candida auris NOT DETECTED NOT DETECTED   Candida glabrata NOT DETECTED NOT DETECTED   Candida krusei NOT DETECTED NOT DETECTED   Candida parapsilosis NOT DETECTED NOT DETECTED   Candida tropicalis NOT DETECTED NOT DETECTED   Cryptococcus neoformans/gattii NOT DETECTED NOT DETECTED   CTX-M ESBL DETECTED (A) NOT DETECTED   Carbapenem resistance IMP NOT DETECTED NOT DETECTED   Carbapenem resistance KPC NOT DETECTED NOT DETECTED   Carbapenem resistance NDM NOT DETECTED NOT DETECTED   Carbapenem resist OXA 48 LIKE NOT DETECTED NOT DETECTED   Carbapenem resistance VIM NOT DETECTED NOT DETECTED    [name] 01/13/2020  10:34 AM

## 2020-01-14 DIAGNOSIS — Z1612 Extended spectrum beta lactamase (ESBL) resistance: Secondary | ICD-10-CM

## 2020-01-14 DIAGNOSIS — A498 Other bacterial infections of unspecified site: Secondary | ICD-10-CM

## 2020-01-14 DIAGNOSIS — B9629 Other Escherichia coli [E. coli] as the cause of diseases classified elsewhere: Secondary | ICD-10-CM

## 2020-01-14 DIAGNOSIS — A419 Sepsis, unspecified organism: Secondary | ICD-10-CM | POA: Diagnosis not present

## 2020-01-14 DIAGNOSIS — N179 Acute kidney failure, unspecified: Secondary | ICD-10-CM

## 2020-01-14 DIAGNOSIS — R7881 Bacteremia: Secondary | ICD-10-CM

## 2020-01-14 LAB — HEMOGLOBIN A1C
Hgb A1c MFr Bld: 6.6 % — ABNORMAL HIGH (ref 4.8–5.6)
Mean Plasma Glucose: 143 mg/dL

## 2020-01-14 LAB — BASIC METABOLIC PANEL
Anion gap: 8 (ref 5–15)
BUN: 31 mg/dL — ABNORMAL HIGH (ref 8–23)
CO2: 27 mmol/L (ref 22–32)
Calcium: 8.2 mg/dL — ABNORMAL LOW (ref 8.9–10.3)
Chloride: 102 mmol/L (ref 98–111)
Creatinine, Ser: 1.29 mg/dL — ABNORMAL HIGH (ref 0.44–1.00)
GFR, Estimated: 41 mL/min — ABNORMAL LOW (ref >60)
Glucose, Bld: 110 mg/dL — ABNORMAL HIGH (ref 70–99)
Potassium: 3.1 mmol/L — ABNORMAL LOW (ref 3.5–5.1)
Sodium: 137 mmol/L (ref 135–145)

## 2020-01-14 LAB — GLUCOSE, CAPILLARY
Glucose-Capillary: 100 mg/dL — ABNORMAL HIGH (ref 70–99)
Glucose-Capillary: 188 mg/dL — ABNORMAL HIGH (ref 70–99)
Glucose-Capillary: 108 mg/dL — ABNORMAL HIGH (ref 70–99)
Glucose-Capillary: 179 mg/dL — ABNORMAL HIGH (ref 70–99)

## 2020-01-14 LAB — URINE CULTURE: Culture: >=100000 — AB

## 2020-01-14 LAB — CBC
HCT: 27.7 % — ABNORMAL LOW (ref 36.0–46.0)
Hemoglobin: 9 g/dL — ABNORMAL LOW (ref 12.0–15.0)
MCH: 35.6 pg — ABNORMAL HIGH (ref 26.0–34.0)
MCHC: 32.5 g/dL (ref 30.0–36.0)
MCV: 109.5 fL — ABNORMAL HIGH (ref 80.0–100.0)
Platelets: 145 10*3/uL — ABNORMAL LOW (ref 150–400)
RBC: 2.53 MIL/uL — ABNORMAL LOW (ref 3.87–5.11)
RDW: 13.4 % (ref 11.5–15.5)
WBC: 16.5 10*3/uL — ABNORMAL HIGH (ref 4.0–10.5)
nRBC: 0 % (ref 0.0–0.2)

## 2020-01-14 MED ORDER — SODIUM CHLORIDE 0.9 % IV SOLN
500.0000 mg | INTRAVENOUS | Status: DC
  Filled 2020-01-14: qty 0.5

## 2020-01-14 MED ORDER — ENSURE ENLIVE PO LIQD
237.0000 mL | Freq: Two times a day (BID) | ORAL | Status: DC
  Administered 2020-01-15 (×2): 237 mL via ORAL

## 2020-01-14 MED ORDER — IPRATROPIUM-ALBUTEROL 0.5-2.5 (3) MG/3ML IN SOLN: 3.0000 mL | Freq: Three times a day (TID) | RESPIRATORY_TRACT | Status: DC | PRN

## 2020-01-14 MED ORDER — POTASSIUM CHLORIDE CRYS ER 20 MEQ PO TBCR
40.0000 meq | EXTENDED_RELEASE_TABLET | Freq: Once | ORAL | Status: AC
  Administered 2020-01-14: 40 meq via ORAL
  Filled 2020-01-14: qty 2

## 2020-01-14 NOTE — Progress Notes (Addendum)
PHARMACY CONSULT NOTE FOR:  OUTPATIENT  PARENTERAL ANTIBIOTIC THERAPY (OPAT)  Indication: ESBL Bacteremia  Regimen: Ertapenem 1000mg  IV q24h  End date: 01/21/2020  IV antibiotic discharge orders are pended. To discharging provider:  please sign these orders via discharge navigator,  Select New Orders & click on the button choice - Manage This Unsigned Work.     Thank you for allowing pharmacy to be a part of this patient's care.  Phillis Haggis 01/14/2020, 4:12 PM

## 2020-01-14 NOTE — Progress Notes (Signed)
PROGRESS NOTE   Kayla Thompson  BZJ:[id number]    DOB: [birth year]    DOA: 01/12/2020  PCP: Marrian Salvage, FNP   I have briefly reviewed patients previous medical records in [facility].  Chief complaint: Altered mental status.  Brief Narrative:  84 year old female, lives alone at home, has an aide who assists her during the day and her daughter stays with her at night, ambulates with the help of a walker, PMH of asthma/COPD on as needed home oxygen,?  Rheumatoid arthritis, chronic prednisone use, anxiety and depression, type II DM, GERD, HTN, hypothyroid, ovarian cancer, chronic UTI on suppressive antibiotic treatments, recurrent pneumonias, recently diagnosed lower extremity edema/chronic diastolic CHF, presented to the ED with complaints of confusion and weakness and admitted for sepsis due to acute lower UTI with ESBL E. coli bacteremia.  Improving  Additional admission history from granddaughter:  Patient's granddaughter at bedside provided substantial history.  She has access to cameras installed in her grandmother's house.  On morning of 10/23, she noted on camera that patient was confused and had called life alert.  When she went to the patient's house, patient was in her bed, confused, noted that she had swelling of her lower extremities with bluish discoloration.  She tried to move her to a chair and elevate her legs but could not manage this by herself.  She then noticed that patient started to have some dyspnea, upper extremity and?  Lips bluish discoloration following which she activated EMS.  She reports patient looks much better today.   Assessment & Plan:  Active Problems:   Sepsis ([place])   Sepsis due to ESBL Ecoli acute lower UTI with bacteremia, POA: Met sepsis criteria on admission including fever of 101.5, tachypnea in the 20s-30s, tachycardia in the 110s, hypoxia requiring oxygen in the ED, leukocytosis and urine microscopy suggestive of UTI.  Lactate normal.   Procalcitonin: 59.59.  Random cortisol: 15.8 deceived IV fluid bolus per sepsis protocol but aggressive IV fluids was not continued due to concern for CHF and related hypoxia.  Had been empirically started on cefepime, metronidazole and vancomycin.  Blood cultures x2: Gram-negative rods, follow final results. BCID: ESBL E. coli.  Urine culture: Confirms ESBL E. coli, multidrug resistant, sensitive to Cipro, imipenem, nitrofurantoin and Zosyn. Sepsis physiology has resolved.  Now on meropenem alone since 10/24.  Activated contact isolation.  Acute kidney injury: Likely due to sepsis, some dehydration and recent Lasix use.  Temporarily hold Lasix.  Not overtly volume overloaded.  Creatinine has gone from 0.87 on 10/19-1.39 on admission to 1.51 on 10/24 bladder scan: 175 mL/no urinary retention.  Hold Lasix.  Avoid nephrotoxic's.  After gentle IV fluids, creatinine has improved to 1.29.  Monitor off of IV fluids with BMP tomorrow.  Hypokalemia: Replace and follow.  Acute metabolic encephalopathy: Secondary to sepsis and AKI.  CT head without acute findings.  As per granddaughter at bedside, mental status significantly improved.  As per discussion with patient's daughters via phone, improved but not yet completely back to baseline  Lower extremity edema with concern for venous insufficiency/concern for HFpEF Seen by Dr. Gasper Sells, Cardioloogy on 12/21/2019.  He indicated lower extremity edema with concern for venous insufficiency.  TTE: OFBP-10%, grade 1 diastolic dysfunction, no mitral valve stenosis and has mild aortic valve stenosis.  Bilateral lower extremity arterial Dopplers: ABI within normal limits.  Bilateral toe brachial index abnormal.  Has not started using compression stockings, placed compression stockings here.  Hold Lasix that had  been initiated due to AKI and clinically does not appear to be in overt CHF at this time.  He indicated that if there was no improvement in follow-up then would  send to vein/vascular specialist for evaluation.  Lower extremity venous Dopplers negative for DVT.  No clinical features suggestive of cellulitis and hence discontinued vancomycin.  Although BNP 264 on admission, clinically not in overt CHF at this time.  Elevated troponin due to demand ischemia HS troponin 57 > 69.  No anginal symptoms.  Likely demand ischemia from sepsis.  Anemia: Hemoglobin 9.9, 11.5 in September, may be related to acute illness or hemodilution.  No bleeding reported.  Hemoglobin stable in the 9 g range for the last 3 days.  Thrombocytopenia: No bleeding reported.  Could be related to sepsis.  Slowly improving.  Follow CBC in a.m.  Type II DM with hyperglycemia: Mildly uncontrolled, probably from IV hydrocortisone she got on admission.  Continue Lantus, SSI and adjust insulins as needed.  Reasonable inpatient control.  Essential hypertension/hypotension: Was hypotensive overnight of admission, s/p stress dose IV hydrocortisone, IV fluid bolus.  Controlled.  Consider resuming carvedilol today or tomorrow.  Asthma/COPD, steroid-dependent and as needed oxygen at home Follows with Dr. Baird Lyons, pulmonology.  On chronic prednisone 2 mg daily.  Received IV hydrocortisone 50 mg every 6 hours x3 doses/stress steroids on admission.  Now back on home prednisone.  No clinical bronchospasm.  Continue Trelegy and DuoNeb's.  Wean off oxygen as tolerated.?  Peripheral cyanosis at home when granddaughter found her.  Hypothyroidism: Continue Synthroid.  ?  Rheumatoid arthritis Patient reports that she follows with Dr. Kathlene November, Rheumatology and feels that she was told that she has?  Rheumatoid arthritis and may be on prednisone for this rather than pulmonary indication.  No acute flare.  Left shoulder pain, chronic States that she has rotator cuff issues, was supposed to have surgery in September by [facility] orthopedics but was postponed due to COVID-19 pandemic.  Outpatient  follow-up.  Anxiety and depression: Continue prior home dose of Cymbalta 90 mg daily, amitriptyline, as needed BuSpar.  Body mass index is 27.43 kg/m.   DVT prophylaxis: Place TED hose Start: 01/13/20 1145 enoxaparin (LOVENOX) injection 30 mg Start: 01/12/20 2200     Code Status: DNR Family Communication: Discussed in detail with patient's 2 daughters via phone, updated care and answered all questions. Disposition:  Status is: Inpatient  Remains inpatient appropriate because:Inpatient level of care appropriate due to severity of illness   Dispo: The patient is from: Home              Anticipated d/c is to: Home              Anticipated d/c date is: 3 days              Patient currently is not medically stable to d/c.        Consultants:   None  Procedures:   None  Antimicrobials:    Anti-infectives (From admission, onward)   Start     Dose/Rate Route Frequency Ordered Stop   01/13/20 2200  meropenem (MERREM) 1 g in sodium chloride 0.9 % 100 mL IVPB        1 g 200 mL/hr over 30 Minutes Intravenous Every 12 hours 01/13/20 1056     01/13/20 1400  vancomycin (VANCOREADY) IVPB 500 mg/100 mL  Status:  Discontinued        500 mg 100 mL/hr over 60 Minutes Intravenous Every  24 hours 01/12/20 1648 01/13/20 1042   01/13/20 1200  ceFEPIme (MAXIPIME) 2 g in sodium chloride 0.9 % 100 mL IVPB  Status:  Discontinued        2 g 200 mL/hr over 30 Minutes Intravenous Every 24 hours 01/12/20 1648 01/13/20 1042   01/13/20 1130  meropenem (MERREM) 1 g in sodium chloride 0.9 % 100 mL IVPB        1 g 200 mL/hr over 30 Minutes Intravenous STAT 01/13/20 1044 01/13/20 1211   01/12/20 1145  vancomycin (VANCOREADY) IVPB 1250 mg/250 mL        1,250 mg 166.7 mL/hr over 90 Minutes Intravenous  Once 01/12/20 1130 01/12/20 1711   01/12/20 1145  ceFEPIme (MAXIPIME) 2 g in sodium chloride 0.9 % 100 mL IVPB        2 g 200 mL/hr over 30 Minutes Intravenous STAT 01/12/20 1132 01/12/20 1351    01/12/20 1130  aztreonam (AZACTAM) 2 g in sodium chloride 0.9 % 100 mL IVPB  Status:  Discontinued        2 g 200 mL/hr over 30 Minutes Intravenous  Once 01/12/20 1120 01/12/20 1132   01/12/20 1130  metroNIDAZOLE (FLAGYL) IVPB 500 mg        500 mg 100 mL/hr over 60 Minutes Intravenous  Once 01/12/20 1120 01/12/20 1513   01/12/20 1130  vancomycin (VANCOCIN) IVPB 1000 mg/200 mL premix  Status:  Discontinued        1,000 mg 200 mL/hr over 60 Minutes Intravenous  Once 01/12/20 1120 01/12/20 1130        Subjective:  Denies any specific complaints.  States that she was unable to tolerate the lower extremity compression stockings because they were too tight.  No dyspnea or chest pain reported.  She did state that she felt stronger.  Has cough with intermittent thick yellow sputum.  Objective:   Vitals:   01/13/20 2045 01/14/20 0508 01/14/20 0924 01/14/20 1456  BP: 107/60 (!) 104/55  134/79  Pulse: 72 75  84  Resp: $Remo'18 20  20  'Iafzs$ Temp: 98 F (36.7 C) 99 F (37.2 C)  98.8 F (37.1 C)  TempSrc: Oral Oral  Oral  SpO2: 99% 96% 94% 97%  Weight:      Height:        General exam: Pleasant elderly female, moderately built and nourished lying comfortably propped up in bed without distress.  Oral mucosa moist. Respiratory system: Clear to auscultation.  No increased work of breathing. Cardiovascular system: S1 & S2 heard, RRR. No JVD, murmurs, rubs, gallops or clicks.  No leg edema and in fact has some wrinkling of skin of her legs.  Telemetry personally reviewed: Sinus rhythm. Gastrointestinal system: Abdomen is nondistended, soft and nontender. No organomegaly or masses felt. Normal bowel sounds heard. Central nervous system: Alert and oriented to person, place and partly to time. No focal neurological deficits. Extremities: Symmetric 5 x 5 power. Skin: No rashes, lesions or ulcers Psychiatry: Judgement and insight appear normal. Mood & affect pleasant and appropriate.     Data Reviewed:   I  have personally reviewed following labs and imaging studies   CBC: Recent Labs  Lab 01/12/20 1048 01/12/20 1048 01/13/20 0519 01/13/20 0726 01/14/20 0536  WBC QUESTIONABLE RESULTS, RECOMMEND RECOLLECT TO VERIFY   < > 17.2* 18.9* 16.5*  NEUTROABS QUESTIONABLE RESULTS, RECOMMEND RECOLLECT TO VERIFY  --   --   --   --   HGB QUESTIONABLE RESULTS, RECOMMEND RECOLLECT TO VERIFY   < >  9.4* 9.9* 9.0*  HCT QUESTIONABLE RESULTS, RECOMMEND RECOLLECT TO VERIFY   < > 29.6* 31.0* 27.7*  MCV QUESTIONABLE RESULTS, RECOMMEND RECOLLECT TO VERIFY   < > 112.5* 111.5* 109.5*  PLT QUESTIONABLE RESULTS, RECOMMEND RECOLLECT TO VERIFY   < > 135* 140* 145*   < > = values in this interval not displayed.    Basic Metabolic Panel: Recent Labs  Lab 01/08/20 1344 01/08/20 1344 01/12/20 1048 01/12/20 1513 01/13/20 0519 01/14/20 0536  NA 138  --  133*  --  136 137  K 4.7   < > 4.3  --  3.9 3.1*  CL 100   < > 95*  --  100 102  CO2 25   < > 22  --  26 27  GLUCOSE 110*  --  200*  --  197* 110*  BUN 18  --  29*  --  31* 31*  CREATININE 0.87   < > 1.39*  --  1.51* 1.29*  CALCIUM 9.0   < > 8.7*  --  8.2* 8.2*  MG 2.1  --   --  2.0  --   --    < > = values in this interval not displayed.    Liver Function Tests: Recent Labs  Lab 01/13/20 0519  AST 31  ALT 21  ALKPHOS 52  BILITOT 0.9  PROT 5.7*  ALBUMIN 2.9*    CBG: Recent Labs  Lab 01/13/20 2100 01/14/20 0746 01/14/20 1209  GLUCAP 169* 100* 188*    Microbiology Studies:   Recent Results (from the past 240 hour(s))  Respiratory Panel by RT PCR (Flu A&B, Covid) - Nasopharyngeal Swab     Status: None   Collection Time: 01/12/20 10:49 AM   Specimen: Nasopharyngeal Swab  Result Value Ref Range Status   SARS Coronavirus 2 by RT PCR NEGATIVE NEGATIVE Final    Comment: (NOTE) SARS-CoV-2 target nucleic acids are NOT DETECTED.  The SARS-CoV-2 RNA is generally detectable in upper respiratoy specimens during the acute phase of infection. The  lowest concentration of SARS-CoV-2 viral copies this assay can detect is 131 copies/mL. A negative result does not preclude SARS-Cov-2 infection and should not be used as the sole basis for treatment or other patient management decisions. A negative result may occur with  improper specimen collection/handling, submission of specimen other than nasopharyngeal swab, presence of viral mutation(s) within the areas targeted by this assay, and inadequate number of viral copies (<131 copies/mL). A negative result must be combined with clinical observations, patient history, and epidemiological information. The expected result is Negative.  Fact Sheet for Patients:  PinkCheek.be  Fact Sheet for Healthcare Providers:  GravelBags.it  This test is no t yet approved or cleared by the [place] FDA and  has been authorized for detection and/or diagnosis of SARS-CoV-2 by FDA under an Emergency Use Authorization (EUA). This EUA will remain  in effect (meaning this test can be used) for the duration of the COVID-19 declaration under Section 564(b)(1) of the Act, 21 U.S.C. section 360bbb-3(b)(1), unless the authorization is terminated or revoked sooner.     Influenza A by PCR NEGATIVE NEGATIVE Final   Influenza B by PCR NEGATIVE NEGATIVE Final    Comment: (NOTE) The Xpert Xpress SARS-CoV-2/FLU/RSV assay is intended as an aid in  the diagnosis of influenza from Nasopharyngeal swab specimens and  should not be used as a sole basis for treatment. Nasal washings and  aspirates are unacceptable for Xpert Xpress SARS-CoV-2/FLU/RSV  testing.  Fact Sheet for Patients: PinkCheek.be  Fact Sheet for Healthcare Providers: GravelBags.it  This test is not yet approved or cleared by the [place] FDA and  has been authorized for detection and/or diagnosis of SARS-CoV-2 by  FDA under  an Emergency Use Authorization (EUA). This EUA will remain  in effect (meaning this test can be used) for the duration of the  Covid-19 declaration under Section 564(b)(1) of the Act, 21  U.S.C. section 360bbb-3(b)(1), unless the authorization is  terminated or revoked. Performed at [facility], [place] [address] [place], [place] [address]   Blood culture (routine x 2)     Status: Abnormal (Preliminary result)   Collection Time: 01/12/20 11:29 AM   Specimen: BLOOD  Result Value Ref Range Status   Specimen Description   Final    BLOOD RIGHT ANTECUBITAL Performed at [facility] [address], [place], [place] [address]    Special Requests   Final    BOTTLES DRAWN AEROBIC AND ANAEROBIC Blood Culture adequate volume Performed at [facility] [address], [place], [place] [address]    Culture  Setup Time   Final    GRAM NEGATIVE RODS ANAEROBIC BOTTLE ONLY Organism ID to follow CRITICAL RESULT CALLED TO, READ BACK BY AND VERIFIED WITH: PHARMD Melodye Ped [id number] MLM Performed at [facility], [place] [address] [place], [place] [address]    Culture ESCHERICHIA COLI (A)  Final   Report Status PENDING  Incomplete  Blood Culture ID Panel (Reflexed)     Status: Abnormal   Collection Time: 01/12/20 11:29 AM  Result Value Ref Range Status   Enterococcus faecalis NOT DETECTED NOT DETECTED Final   Enterococcus Faecium NOT DETECTED NOT DETECTED Final   Listeria monocytogenes NOT DETECTED NOT DETECTED Final   Staphylococcus species NOT DETECTED NOT DETECTED Final   Staphylococcus aureus (BCID) NOT DETECTED NOT DETECTED Final   Staphylococcus epidermidis NOT DETECTED NOT DETECTED Final   Staphylococcus lugdunensis NOT DETECTED NOT DETECTED Final   Streptococcus species NOT DETECTED NOT DETECTED Final   Streptococcus agalactiae NOT DETECTED NOT DETECTED Final   Streptococcus pneumoniae NOT DETECTED NOT DETECTED Final    Streptococcus pyogenes NOT DETECTED NOT DETECTED Final   A.calcoaceticus-baumannii NOT DETECTED NOT DETECTED Final   Bacteroides fragilis NOT DETECTED NOT DETECTED Final   Enterobacterales DETECTED (A) NOT DETECTED Final    Comment: Enterobacterales represent a large order of gram negative bacteria, not a single organism. CRITICAL RESULT CALLED TO, READ BACK BY AND VERIFIED WITH: PHARMD J GADHIA [phone] MLM    Enterobacter cloacae complex NOT DETECTED NOT DETECTED Final   Escherichia coli DETECTED (A) NOT DETECTED Final    Comment: CRITICAL RESULT CALLED TO, READ BACK BY AND VERIFIED WITH: PHARMD J GADHIA [id number] MLM    Klebsiella aerogenes NOT DETECTED NOT DETECTED Final   Klebsiella oxytoca NOT DETECTED NOT DETECTED Final   Klebsiella pneumoniae NOT DETECTED NOT DETECTED Final   Proteus species NOT DETECTED NOT DETECTED Final   Salmonella species NOT DETECTED NOT DETECTED Final   Serratia marcescens NOT DETECTED NOT DETECTED Final   Haemophilus influenzae NOT DETECTED NOT DETECTED Final   Neisseria meningitidis NOT DETECTED NOT DETECTED Final   Pseudomonas aeruginosa NOT DETECTED NOT DETECTED Final   Stenotrophomonas maltophilia NOT DETECTED NOT DETECTED Final   Candida albicans NOT DETECTED NOT DETECTED Final   Candida auris NOT DETECTED NOT DETECTED Final   Candida glabrata NOT DETECTED NOT DETECTED  Final   Candida krusei NOT DETECTED NOT DETECTED Final   Candida parapsilosis NOT DETECTED NOT DETECTED Final   Candida tropicalis NOT DETECTED NOT DETECTED Final   Cryptococcus neoformans/gattii NOT DETECTED NOT DETECTED Final   CTX-M ESBL DETECTED (A) NOT DETECTED Final    Comment: CRITICAL RESULT CALLED TO, READ BACK BY AND VERIFIED WITH: PHARMD J GADHIA [phone] MLM (NOTE) Extended spectrum beta-lactamase detected. Recommend a carbapenem as initial therapy.      Carbapenem resistance IMP NOT DETECTED NOT DETECTED Final   Carbapenem resistance KPC NOT DETECTED NOT  DETECTED Final   Carbapenem resistance NDM NOT DETECTED NOT DETECTED Final   Carbapenem resist OXA 48 LIKE NOT DETECTED NOT DETECTED Final   Carbapenem resistance VIM NOT DETECTED NOT DETECTED Final    Comment: Performed at [facility], [place] [address] [place], [place] [address]  Urine culture     Status: Abnormal   Collection Time: 01/12/20 12:33 PM   Specimen: Urine, Random  Result Value Ref Range Status   Specimen Description   Final    URINE, RANDOM Performed at [facility] [address] [place], [place] [address]    Special Requests   Final    URINE, RANDOM Performed at [facility] [address], [place], [place] [address]    Culture (A)  Final    >=100,000 COLONIES/mL ESCHERICHIA COLI Confirmed Extended Spectrum Beta-Lactamase Producer (ESBL).  In bloodstream infections from ESBL organisms, carbapenems are preferred over piperacillin/tazobactam. They are shown to have a lower risk of mortality.    Report Status 01/14/2020 FINAL  Final   Organism ID, Bacteria ESCHERICHIA COLI (A)  Final      Susceptibility   Escherichia coli - MIC*    AMPICILLIN >=32 RESISTANT Resistant     CEFAZOLIN >=64 RESISTANT Resistant     CEFTRIAXONE >=64 RESISTANT Resistant     CIPROFLOXACIN 1 SENSITIVE Sensitive     GENTAMICIN >=16 RESISTANT Resistant     IMIPENEM <=0.25 SENSITIVE Sensitive     NITROFURANTOIN <=16 SENSITIVE Sensitive     TRIMETH/SULFA >=320 RESISTANT Resistant     AMPICILLIN/SULBACTAM >=32 RESISTANT Resistant     PIP/TAZO <=4 SENSITIVE Sensitive     * >=100,000 COLONIES/mL ESCHERICHIA COLI  Blood culture (routine x 2)     Status: None (Preliminary result)   Collection Time: 01/12/20 12:33 PM   Specimen: BLOOD LEFT FOREARM  Result Value Ref Range Status   Specimen Description   Final    BLOOD LEFT FOREARM Performed at [facility], [address], [place], [place] [address]    Special Requests   Final    BOTTLES DRAWN  AEROBIC AND ANAEROBIC Blood Culture adequate volume Performed at [facility] [address], [place], [place] [address]    Culture  Setup Time   Final    GRAM NEGATIVE RODS IN BOTH AEROBIC AND ANAEROBIC BOTTLES CRITICAL RESULT CALLED TO, READ BACK BY AND VERIFIED WITH: PHARMD Melodye Ped [id number] MLM Performed at [facility], [address], [place],  [address]    Culture GRAM NEGATIVE RODS  Final   Report Status PENDING  Incomplete     Radiology Studies:  VAS [place] LOWER EXTREMITY VENOUS (DVT)  Result Date: 01/13/2020  Lower Venous DVTStudy Indications: Edema.  Limitations: Poor ultrasound/tissue interface. Comparison Study: No prior study Performing Technologist: Maudry Mayhew MHA, RDMS, RVT, RDCS  Examination Guidelines: A complete evaluation includes B-mode imaging, spectral  Doppler, color Doppler, and power Doppler as needed of all accessible portions of each vessel. Bilateral testing is considered an integral part of a complete examination. Limited examinations for reoccurring indications may be performed as noted. The reflux portion of the exam is performed with the patient in reverse Trendelenburg.  +---------+---------------+---------+-----------+----------+--------------+ RIGHT    CompressibilityPhasicitySpontaneityPropertiesThrombus Aging +---------+---------------+---------+-----------+----------+--------------+ CFV      Full           No       Yes                                 +---------+---------------+---------+-----------+----------+--------------+ SFJ      Full                                                        +---------+---------------+---------+-----------+----------+--------------+ FV Prox  Full                                                        +---------+---------------+---------+-----------+----------+--------------+ FV Mid   Full                                                         +---------+---------------+---------+-----------+----------+--------------+ FV DistalFull                                                        +---------+---------------+---------+-----------+----------+--------------+ PFV      Full                                                        +---------+---------------+---------+-----------+----------+--------------+ POP      Full           Yes      Yes                                 +---------+---------------+---------+-----------+----------+--------------+ PTV      Full                                                        +---------+---------------+---------+-----------+----------+--------------+   Right Technical Findings: Not visualized segments include peroneal veins.  +---------+---------------+---------+-----------+----------+--------------+ LEFT     CompressibilityPhasicitySpontaneityPropertiesThrombus Aging +---------+---------------+---------+-----------+----------+--------------+ CFV      Full           No       Yes                                 +---------+---------------+---------+-----------+----------+--------------+  SFJ      Full                                                        +---------+---------------+---------+-----------+----------+--------------+ FV Prox  Full                                                        +---------+---------------+---------+-----------+----------+--------------+ FV Mid   Full                                                        +---------+---------------+---------+-----------+----------+--------------+ FV DistalFull                                                        +---------+---------------+---------+-----------+----------+--------------+ PFV      Full                                                        +---------+---------------+---------+-----------+----------+--------------+ POP      Full           Yes      Yes                                  +---------+---------------+---------+-----------+----------+--------------+ PTV      Full                                                        +---------+---------------+---------+-----------+----------+--------------+ PERO     Full                                                        +---------+---------------+---------+-----------+----------+--------------+   Left Technical Findings: Not visualized segments include Limited evaluation of calf veins.   Summary: RIGHT: - There is no evidence of deep vein thrombosis in the lower extremity. However, portions of this examination were limited- see technologist comments above.  - No cystic structure found in the popliteal fossa.  LEFT: - There is no evidence of deep vein thrombosis in the lower extremity. However, portions of this examination were limited- see technologist comments above.  - No cystic structure found in the popliteal fossa.  *See table(s) above for measurements and observations.    Preliminary      Scheduled Meds:   . amitriptyline  10 mg Oral QHS  . baclofen  5-10 mg Oral BID  . calcium carbonate  1 tablet Oral BID WC  . cholecalciferol  4,000 Units Oral Daily  . DULoxetine  90 mg Oral Daily  . enoxaparin (LOVENOX) injection  30 mg Subcutaneous Q24H  . ferrous gluconate  324 mg Oral Q breakfast  . umeclidinium bromide  1 puff Inhalation Daily   And  . fluticasone furoate-vilanterol  1 puff Inhalation Daily  . insulin aspart  0-15 Units Subcutaneous TID WC  . insulin aspart  0-5 Units Subcutaneous QHS  . insulin glargine  15 Units Subcutaneous Daily  . levothyroxine  50 mcg Oral QAC breakfast  . magnesium oxide  400 mg Oral Daily  . montelukast  10 mg Oral QHS  . pantoprazole  40 mg Oral Daily  . predniSONE  2 mg Oral Daily  . rosuvastatin  10 mg Oral QPM    Continuous Infusions:   . meropenem (MERREM) IV 1 g (01/14/20 1016)     LOS: 2 days     Vernell Leep, MD, [place],  [facility]. [facility]    To contact the attending provider between 7A-7P or the covering provider during after hours 7P-7A, please log into the web site [email or web address] and access using universal [place] password for that web site. If you do not have the password, please call the hospital operator.  01/14/2020, 3:38 PM

## 2020-01-14 NOTE — Consult Note (Addendum)
[facility]ious [facility]  Total days of antibiotics 3               Reason for Consult: esbl bacteremia with urinary source    Referring Physician: [email or web address]  Active Problems:   Sepsis [facility])    HPI: Kayla Thompson is a 84 y.o. female with DM2, COPD, GERD, hx of ovarian cancer with recurrent UTI on suppressive abtx in the past, who was admitted on 10/23 with fever of 101.5, AMS, SIRS with abnormal UA. She was stared on vancomycin/cefepime for sepsis protocol. Her blood cx identified quickly that she had ESBL ecoli bacteremia. Her antibiotics changed to meropenem. On admit, WBC of 17.2, cr of 1.5 (bL in 0.9). she has improved with her sensorium but not completely back to baseline. Now is afebrile, but still feeling weak.  Past Medical History:  Diagnosis Date  . Anxiety   . Arthritis   . Asthma   . Cataracts, bilateral    removed by surgery  . COPD (chronic obstructive pulmonary disease) ([place])   . Depression   . Diabetes mellitus without complication ([place])    type 2  . Dyspnea    with exertion  . Fibromyalgia   . GERD (gastroesophageal reflux disease)   . History of chemotherapy   . History of fractured pelvis   . Hypertension   . Hypothyroidism   . Osteoporosis 12/2017   T score -2.8 stable from prior DEXA  . Osteoporosis   . Ovarian cancer ([place])   . Pneumonia    several times    Allergies:  Allergies  Allergen Reactions  . Latex Rash  . Penicillins Rash    Has patient had a PCN reaction causing immediate rash, facial/tongue/throat swelling, SOB or lightheadedness with hypotension: No Has patient had a PCN reaction causing severe rash involving mucus membranes or skin necrosis: No Has patient had a PCN reaction that required hospitalization: No Has patient had a PCN reaction occurring within the last 10 years: No If all of the above answers are "NO", then may proceed with Cephalosporin use.      MEDICATIONS: . amitriptyline  10 mg Oral QHS  .  baclofen  5-10 mg Oral BID  . calcium carbonate  1 tablet Oral BID WC  . cholecalciferol  4,000 Units Oral Daily  . DULoxetine  90 mg Oral Daily  . enoxaparin (LOVENOX) injection  30 mg Subcutaneous Q24H  . ferrous gluconate  324 mg Oral Q breakfast  . umeclidinium bromide  1 puff Inhalation Daily   And  . fluticasone furoate-vilanterol  1 puff Inhalation Daily  . insulin aspart  0-15 Units Subcutaneous TID WC  . insulin aspart  0-5 Units Subcutaneous QHS  . insulin glargine  15 Units Subcutaneous Daily  . levothyroxine  50 mcg Oral QAC breakfast  . magnesium oxide  400 mg Oral Daily  . montelukast  10 mg Oral QHS  . pantoprazole  40 mg Oral Daily  . predniSONE  2 mg Oral Daily  . rosuvastatin  10 mg Oral QPM    Social History   Tobacco Use  . Smoking status: Never Smoker  . Smokeless tobacco: Never Used  Vaping Use  . Vaping Use: Never used  Substance Use Topics  . Alcohol use: No    Alcohol/week: 0.0 standard drinks  . Drug use: No    Family History  Problem Relation Age of Onset  . Lung cancer Mother   . CAD Father   .  Diabetes Father   . Lung cancer Maternal Aunt   . Diabetes Paternal Aunt   . Diabetes Paternal Uncle   . Diabetes Paternal Grandmother   . Diabetes Paternal Grandfather   . Colon cancer Neg Hx   . Esophageal cancer Neg Hx   . Inflammatory bowel disease Neg Hx   . Liver disease Neg Hx   . Pancreatic cancer Neg Hx   . Rectal cancer Neg Hx   . Stomach cancer Neg Hx     Review of Systems  Constitutional: positive for fever, chills, diaphoresis, activity change, appetite change, fatigue and unexpected weight change.  HENT: Negative for congestion, sore throat, rhinorrhea, sneezing, trouble swallowing and sinus pressure.  Eyes: Negative for photophobia and visual disturbance.  Respiratory: Negative for cough, chest tightness, shortness of breath, wheezing and stridor.  Cardiovascular: Negative for chest pain, palpitations and leg swelling.   Gastrointestinal: Negative for nausea, vomiting, abdominal pain, diarrhea, constipation, blood in stool, abdominal distention and anal bleeding.  Genitourinary: positive for for dysuria, and --,Negative  hematuria, flank pain and difficulty urinating.  Musculoskeletal: Negative for myalgias, back pain, joint swelling, arthralgias and gait problem.  Skin: Negative for color change, pallor, rash and wound.  Neurological: Negative for dizziness, tremors, weakness and light-headedness.  Hematological: Negative for adenopathy. Does not bruise/bleed easily.  Psychiatric/Behavioral: Negative for behavioral problems, confusion, sleep disturbance, dysphoric mood, decreased concentration and agitation.     OBJECTIVE: Temp:  [98 F (36.7 C)-99 F (37.2 C)] 98.8 F (37.1 C) (10/25 1456) Pulse Rate:  [72-84] 84 (10/25 1456) Resp:  [18-20] 20 (10/25 1456) BP: (104-134)/(55-79) 134/79 (10/25 1456) SpO2:  [94 %-99 %] 97 % (10/25 1456) Physical Exam  Constitutional:  oriented to person, place, and time. appears well-developed and well-nourished. No distress.  HENT: [place]/AT, PERRLA, no scleral icterus Mouth/Throat: Oropharynx is clear and moist. No oropharyngeal exudate.  Cardiovascular: Normal rate, regular rhythm and normal heart sounds. Exam reveals no gallop and no friction rub.  No murmur heard.  Pulmonary/Chest: Effort normal and breath sounds normal. No respiratory distress.  has no wheezes.  Neck = supple, no nuchal rigidity Abdominal: Soft. Bowel sounds are normal.  exhibits no distension. There is no tenderness.  Lymphadenopathy: no cervical adenopathy. No axillary adenopathy Neurological: alert and oriented to person, place, and time.  Skin: Skin is warm and dry. No rash noted. No erythema.  Psychiatric: a normal mood and affect.  behavior is normal.    LABS: Results for orders placed or performed during the hospital encounter of 01/12/20 (from the past 48 hour(s))  CBG monitoring, ED      Status: Abnormal   Collection Time: 01/12/20  5:08 PM  Result Value Ref Range   Glucose-Capillary 120 (H) 70 - 99 mg/dL    Comment: Glucose reference range applies only to samples taken after fasting for at least 8 hours.  Glucose, capillary     Status: Abnormal   Collection Time: 01/12/20  8:28 PM  Result Value Ref Range   Glucose-Capillary 203 (H) 70 - 99 mg/dL    Comment: Glucose reference range applies only to samples taken after fasting for at least 8 hours.  Hemoglobin A1c     Status: Abnormal   Collection Time: 01/13/20  5:19 AM  Result Value Ref Range   Hgb A1c MFr Bld 6.6 (H) 4.8 - 5.6 %    Comment: (NOTE)         Prediabetes: 5.7 - 6.4  Diabetes: >6.4         Glycemic control for adults with diabetes: <7.0    Mean Plasma Glucose 143 mg/dL    Comment: (NOTE) Performed At: [facility] [place], [place] [id number] Rush Farmer MD VO:[id number]   Protime-INR     Status: None   Collection Time: 01/13/20  5:19 AM  Result Value Ref Range   Prothrombin Time 13.7 11.4 - 15.2 seconds   INR 1.1 0.8 - 1.2    Comment: (NOTE) INR goal varies based on device and disease states. Performed at [facility], [place] [address] [place],  [address]   Procalcitonin     Status: None   Collection Time: 01/13/20  5:19 AM  Result Value Ref Range   Procalcitonin 59.59 ng/mL    Comment:        Interpretation: PCT >= 10 ng/mL: Important systemic inflammatory response, almost exclusively due to severe bacterial sepsis or septic shock. (NOTE)       Sepsis PCT Algorithm           Lower Respiratory Tract                                      Infection PCT Algorithm    ----------------------------     ----------------------------         PCT < 0.25 ng/mL                PCT < 0.10 ng/mL          Strongly encourage             Strongly discourage   discontinuation of antibiotics    initiation of antibiotics     ----------------------------     -----------------------------       PCT 0.25 - 0.50 ng/mL            PCT 0.10 - 0.25 ng/mL               OR       >80% decrease in PCT            Discourage initiation of                                            antibiotics      Encourage discontinuation           of antibiotics    ----------------------------     -----------------------------         PCT >= 0.50 ng/mL              PCT 0.26 - 0.50 ng/mL                AND       <80% decrease in PCT             Encourage initiation of                                             antibiotics       Encourage continuation           of antibiotics    ----------------------------     -----------------------------  PCT >= 0.50 ng/mL                  PCT > 0.50 ng/mL               AND         increase in PCT                  Strongly encourage                                      initiation of antibiotics    Strongly encourage escalation           of antibiotics                                     -----------------------------                                           PCT <= 0.25 ng/mL                                                 OR                                        > 80% decrease in PCT                                      Discontinue / Do not initiate                                             antibiotics  Performed at [facility] [address], [place], [place] [address]   CBC     Status: Abnormal   Collection Time: 01/13/20  5:19 AM  Result Value Ref Range   WBC 17.2 (H) 4.0 - 10.5 K/uL   RBC 2.63 (L) 3.87 - 5.11 MIL/uL   Hemoglobin 9.4 (L) 12.0 - 15.0 g/dL    Comment: REPEATED TO VERIFY   HCT 29.6 (L) 36 - 46 %   MCV 112.5 (H) 80.0 - 100.0 fL   MCH 35.7 (H) 26.0 - 34.0 pg   MCHC 31.8 30.0 - 36.0 g/dL   RDW 13.8 11.5 - 15.5 %   Platelets 135 (L) 150 - 400 K/uL   nRBC 0.0 0.0 - 0.2 %    Comment: Performed at [facility], [place] [address], [place],  [address]  Comprehensive metabolic panel     Status: Abnormal   Collection Time: 01/13/20  5:19 AM  Result Value Ref Range   Sodium 136 135 - 145 mmol/L   Potassium 3.9 3.5 - 5.1 mmol/L   Chloride 100 98 - 111 mmol/L   CO2 26 22 - 32 mmol/L   Glucose, Bld 197 (H)  70 - 99 mg/dL    Comment: Glucose reference range applies only to samples taken after fasting for at least 8 hours.   BUN 31 (H) 8 - 23 mg/dL   Creatinine, Ser 1.51 (H) 0.44 - 1.00 mg/dL   Calcium 8.2 (L) 8.9 - 10.3 mg/dL   Total Protein 5.7 (L) 6.5 - 8.1 g/dL   Albumin 2.9 (L) 3.5 - 5.0 g/dL   AST 31 15 - 41 U/L   ALT 21 0 - 44 U/L   Alkaline Phosphatase 52 38 - 126 U/L   Total Bilirubin 0.9 0.3 - 1.2 mg/dL   GFR, Estimated 34 (L) >60 mL/min    Comment: (NOTE) Calculated using the CKD-EPI Creatinine Equation (2021)    Anion gap 10 5 - 15    Comment: Performed at [facility], [place] [address] [place], [place] [address]  CBC     Status: Abnormal   Collection Time: 01/13/20  7:26 AM  Result Value Ref Range   WBC 18.9 (H) 4.0 - 10.5 K/uL   RBC 2.78 (L) 3.87 - 5.11 MIL/uL   Hemoglobin 9.9 (L) 12.0 - 15.0 g/dL   HCT 31.0 (L) 36 - 46 %   MCV 111.5 (H) 80.0 - 100.0 fL   MCH 35.6 (H) 26.0 - 34.0 pg   MCHC 31.9 30.0 - 36.0 g/dL   RDW 13.7 11.5 - 15.5 %   Platelets 140 (L) 150 - 400 K/uL   nRBC 0.0 0.0 - 0.2 %    Comment: Performed at [facility], [place] [address] [place], [place] [address]  Glucose, capillary     Status: Abnormal   Collection Time: 01/13/20  7:36 AM  Result Value Ref Range   Glucose-Capillary 181 (H) 70 - 99 mg/dL    Comment: Glucose reference range applies only to samples taken after fasting for at least 8 hours.   Comment 1 Notify RN   Glucose, capillary     Status: Abnormal   Collection Time: 01/13/20 11:26 AM  Result Value Ref Range   Glucose-Capillary 135 (H) 70 - 99 mg/dL    Comment: Glucose reference range applies only to samples taken  after fasting for at least 8 hours.   Comment 1 Notify RN    Comment 2 Document in Chart   Glucose, capillary     Status: Abnormal   Collection Time: 01/13/20  4:51 PM  Result Value Ref Range   Glucose-Capillary 213 (H) 70 - 99 mg/dL    Comment: Glucose reference range applies only to samples taken after fasting for at least 8 hours.   Comment 1 Notify RN   Glucose, capillary     Status: Abnormal   Collection Time: 01/13/20  9:00 PM  Result Value Ref Range   Glucose-Capillary 169 (H) 70 - 99 mg/dL    Comment: Glucose reference range applies only to samples taken after fasting for at least 8 hours.  CBC     Status: Abnormal   Collection Time: 01/14/20  5:36 AM  Result Value Ref Range   WBC 16.5 (H) 4.0 - 10.5 K/uL   RBC 2.53 (L) 3.87 - 5.11 MIL/uL   Hemoglobin 9.0 (L) 12.0 - 15.0 g/dL   HCT 27.7 (L) 36 - 46 %   MCV 109.5 (H) 80.0 - 100.0 fL   MCH 35.6 (H) 26.0 - 34.0 pg   MCHC 32.5 30.0 - 36.0 g/dL   RDW 13.4 11.5 - 15.5 %   Platelets 145 (L)  150 - 400 K/uL   nRBC 0.0 0.0 - 0.2 %    Comment: Performed at [facility], [place] [address], [place], [place] [address]  Basic metabolic panel     Status: Abnormal   Collection Time: 01/14/20  5:36 AM  Result Value Ref Range   Sodium 137 135 - 145 mmol/L   Potassium 3.1 (L) 3.5 - 5.1 mmol/L    Comment: DELTA CHECK NOTED   Chloride 102 98 - 111 mmol/L   CO2 27 22 - 32 mmol/L   Glucose, Bld 110 (H) 70 - 99 mg/dL    Comment: Glucose reference range applies only to samples taken after fasting for at least 8 hours.   BUN 31 (H) 8 - 23 mg/dL   Creatinine, Ser 1.29 (H) 0.44 - 1.00 mg/dL   Calcium 8.2 (L) 8.9 - 10.3 mg/dL   GFR, Estimated 41 (L) >60 mL/min    Comment: (NOTE) Calculated using the CKD-EPI Creatinine Equation (2021)    Anion gap 8 5 - 15    Comment: Performed at [facility], [place] [address], [place], [place] [address]  Glucose, capillary     Status: Abnormal   Collection Time: 01/14/20   7:46 AM  Result Value Ref Range   Glucose-Capillary 100 (H) 70 - 99 mg/dL    Comment: Glucose reference range applies only to samples taken after fasting for at least 8 hours.   Comment 1 Notify RN   Glucose, capillary     Status: Abnormal   Collection Time: 01/14/20 12:09 PM  Result Value Ref Range   Glucose-Capillary 188 (H) 70 - 99 mg/dL    Comment: Glucose reference range applies only to samples taken after fasting for at least 8 hours.   Comment 1 Notify RN   Glucose, capillary     Status: Abnormal   Collection Time: 01/14/20  4:31 PM  Result Value Ref Range   Glucose-Capillary 108 (H) 70 - 99 mg/dL    Comment: Glucose reference range applies only to samples taken after fasting for at least 8 hours.   Comment 1 Notify RN     MICRO: 10/23: urine cx ecoli ESBL 10/23: blood cx ecoli IMAGING: VAS [place] LOWER EXTREMITY VENOUS (DVT)  Result Date: 01/13/2020  Lower Venous DVTStudy Indications: Edema.  Limitations: Poor ultrasound/tissue interface. Comparison Study: No prior study Performing Technologist: Maudry Mayhew MHA, RDMS, RVT, RDCS  Examination Guidelines: A complete evaluation includes B-mode imaging, spectral Doppler, color Doppler, and power Doppler as needed of all accessible portions of each vessel. Bilateral testing is considered an integral part of a complete examination. Limited examinations for reoccurring indications may be performed as noted. The reflux portion of the exam is performed with the patient in reverse Trendelenburg.  +---------+---------------+---------+-----------+----------+--------------+ RIGHT    CompressibilityPhasicitySpontaneityPropertiesThrombus Aging +---------+---------------+---------+-----------+----------+--------------+ CFV      Full           No       Yes                                 +---------+---------------+---------+-----------+----------+--------------+ SFJ      Full                                                         +---------+---------------+---------+-----------+----------+--------------+  FV Prox  Full                                                        +---------+---------------+---------+-----------+----------+--------------+ FV Mid   Full                                                        +---------+---------------+---------+-----------+----------+--------------+ FV DistalFull                                                        +---------+---------------+---------+-----------+----------+--------------+ PFV      Full                                                        +---------+---------------+---------+-----------+----------+--------------+ POP      Full           Yes      Yes                                 +---------+---------------+---------+-----------+----------+--------------+ PTV      Full                                                        +---------+---------------+---------+-----------+----------+--------------+   Right Technical Findings: Not visualized segments include peroneal veins.  +---------+---------------+---------+-----------+----------+--------------+ LEFT     CompressibilityPhasicitySpontaneityPropertiesThrombus Aging +---------+---------------+---------+-----------+----------+--------------+ CFV      Full           No       Yes                                 +---------+---------------+---------+-----------+----------+--------------+ SFJ      Full                                                        +---------+---------------+---------+-----------+----------+--------------+ FV Prox  Full                                                        +---------+---------------+---------+-----------+----------+--------------+ FV Mid   Full                                                        +---------+---------------+---------+-----------+----------+--------------+  FV DistalFull                                                         +---------+---------------+---------+-----------+----------+--------------+ PFV      Full                                                        +---------+---------------+---------+-----------+----------+--------------+ POP      Full           Yes      Yes                                 +---------+---------------+---------+-----------+----------+--------------+ PTV      Full                                                        +---------+---------------+---------+-----------+----------+--------------+ PERO     Full                                                        +---------+---------------+---------+-----------+----------+--------------+   Left Technical Findings: Not visualized segments include Limited evaluation of calf veins.   Summary: RIGHT: - There is no evidence of deep vein thrombosis in the lower extremity. However, portions of this examination were limited- see technologist comments above.  - No cystic structure found in the popliteal fossa.  LEFT: - There is no evidence of deep vein thrombosis in the lower extremity. However, portions of this examination were limited- see technologist comments above.  - No cystic structure found in the popliteal fossa.  *See table(s) above for measurements and observations.    Preliminary     Assessment/Plan:  84yo F with hx of ovarian cancer, on chronic steroids, with recurrent uti hx presented with sepsis due to urinary source, found to have ESBL ecoli bacteremia and UTI  Recommend to treat for 7 additional more days of carbapenem. For home, can do ertapenem 500mg  IVdaily. Has an aide to help with administration. Will coordinate with home health.  Recommend to get picc line  Can pull picc line once abtx is complete on 11/1, see outpatient order  leukocytosys = trending downward, recommend repeat tomorrow  aki = improving

## 2020-01-14 NOTE — Evaluation (Signed)
Physical Therapy Evaluation Patient Details Name: Kayla Thompson MRN: [id number] DOB: Aug 22, 1935 Today's Date: 01/14/2020   History of Present Illness  Patient is 84 y.o. female with PMH significant for asthma, COPD, RA, anxiety, depression, DMII, GERD, HTN, CHF, ovarian cancer, hypotyroidism, chroinic UTis and PNA. Patient presented to [facility] on 10/23 with c/o weakness and confusion and was admitted for sepsis due to UTI.    Clinical Impression  Kayla Thompson is 84 y.o. female admitted with above HPI and diagnosis. Patient is currently limited by functional impairments below (see PT problem list). Patient lives alone and use RW for mobility at baseline; she has 24/7 asisst with [facility] aides. Patient currently requires min assist for mobility with cues for safety and use of RW for dynamic standing/gait. Patient will benefit from continued skilled PT interventions to address impairments and progress independence with mobility, recommending HHPT with continued 24/7 assist from home aides. Acute PT will follow and progress as able.     Follow Up Recommendations Home health PT;Supervision/Assistance - 24 hour    Equipment Recommendations  None recommended by PT    Recommendations for Other Services OT consult     Precautions / Restrictions Precautions Precautions: Fall Restrictions Weight Bearing Restrictions: No      Mobility  Bed Mobility Overal bed mobility: Needs Assistance Bed Mobility: Supine to Sit     Supine to sit: Min assist;HOB elevated;Mod assist     General bed mobility comments: cues for use of bed rail and to walk LE's off EOB, Min-Mod assist to raise trunk and pivot fully.     Transfers Overall transfer level: Needs assistance Equipment used: Rolling walker (2 wheeled) Transfers: Sit to/from [facility] Sit to Stand: Min assist Stand pivot transfers: Min assist       General transfer comment: Cues for hand placement/technique with RW, min  assist to initiate and complete power up and steady in standing. Min assist with cues for walker positioning to step to recliner.   Ambulation/Gait Ambulation/Gait assistance: Min assist Gait Distance (Feet): 6 Feet Assistive device: Rolling walker (2 wheeled) Gait Pattern/deviations: Decreased step length - right;Decreased step length - left;Decreased stride length;Step-through pattern Gait velocity: decr   General Gait Details: cues for safe hand placement on RW and to maintain safe proximity during stand step transfer to recliner. no overt LOB noted  Stairs            Wheelchair Mobility    Modified Rankin (Stroke Patients Only)       Balance Overall balance assessment: Needs assistance Sitting-balance support: Feet supported Sitting balance-Leahy Scale: Good Sitting balance - Comments: pt washing self with soapy clothes after soilde gown removed.    Standing balance support: No upper extremity supported;Bilateral upper extremity supported;During functional activity Standing balance-Leahy Scale: Fair Standing balance comment: pt able to wash more in standing with min guard for safety and no UE support. assist to wash back. pt donned clean gown in standing.                             Pertinent Vitals/Pain Pain Assessment: Faces Faces Pain Scale: Hurts a little bit Pain Location: bil UE's Pain Descriptors / Indicators: Aching;Discomfort Pain Intervention(s): Limited activity within patient's tolerance;Monitored during session;Repositioned    Home Living Family/patient expects to be discharged to:: Private residence Living Arrangements: Alone (daughter moved out 1 month ago, lives down the block)   Type of Home: [place]  Access: Stairs to enter Entrance Stairs-Rails: None Entrance Stairs-Number of Steps: 2 Home Layout: One level Home Equipment: Walker - 2 wheels;Bedside commode;Shower seat Additional Comments: pt's daughter comes over to help her in  the evenings some    Prior Function Level of Independence: Independent with assistive device(s);Needs assistance   Gait / Transfers Assistance Needed: RW for gait/household mobility  ADL's / Homemaking Assistance Needed: home aides help with pt getting in/out of shower, help with meals.         Hand Dominance        Extremity/Trunk Assessment   Upper Extremity Assessment Upper Extremity Assessment: Generalized weakness;Defer to OT evaluation    Lower Extremity Assessment Lower Extremity Assessment: Generalized weakness    Cervical / Trunk Assessment Cervical / Trunk Assessment: Kyphotic  Communication   Communication: No difficulties  Cognition Arousal/Alertness: Awake/alert Behavior During Therapy: WFL for tasks assessed/performed Overall Cognitive Status: Within Functional Limits for tasks assessed                                        General Comments      Exercises     Assessment/Plan    PT Assessment Patient needs continued PT services  PT Problem List Decreased strength;Decreased activity tolerance;Decreased balance;Decreased mobility;Decreased knowledge of use of DME;Decreased safety awareness;Decreased knowledge of precautions       PT Treatment Interventions DME instruction;Gait training;Stair training;Functional mobility training;Therapeutic activities;Therapeutic exercise;Balance training;Patient/family education    PT Goals (Current goals can be found in the Care Plan section)  Acute Rehab PT Goals Patient Stated Goal: return home and get stronger PT Goal Formulation: With patient Time For Goal Achievement: 01/28/20 Potential to Achieve Goals: Good    Frequency Min 3X/week   Barriers to discharge        Co-evaluation               AM-PAC PT "6 Clicks" Mobility  Outcome Measure Help needed turning from your back to your side while in a flat bed without using bedrails?: A Little Help needed moving from lying on your  back to sitting on the side of a flat bed without using bedrails?: A Little Help needed moving to and from a bed to a chair (including a wheelchair)?: A Little Help needed standing up from a chair using your arms (e.g., wheelchair or bedside chair)?: A Little Help needed to walk in hospital room?: A Little Help needed climbing 3-5 steps with a railing? : A Lot 6 Click Score: 17    End of Session Equipment Utilized During Treatment: Gait belt Activity Tolerance: Patient tolerated treatment well Patient left: in chair;with call bell/phone within reach;with chair alarm set Nurse Communication: Mobility status PT Visit Diagnosis: Difficulty in walking, not elsewhere classified (R26.2);Muscle weakness (generalized) (M62.81)    Time: 9470-9628 PT Time Calculation (min) (ACUTE ONLY): 29 min   Charges:   PT Evaluation $PT Eval Low Complexity: 1 Low PT Treatments $Therapeutic Activity: 8-22 mins       Verner Mould, DPT Acute Rehabilitation Services  Office [phone] Pager [phone]  01/14/2020 5:16 PM

## 2020-01-14 NOTE — Evaluation (Signed)
Occupational Therapy Evaluation Patient Details Name: Kayla Thompson MRN: [id number] DOB: 08-22-1935 Today's Date: 01/14/2020    History of Present Illness Patient is 84 y.o. female with PMH significant for asthma, COPD, RA, anxiety, depression, DMII, GERD, HTN, CHF, ovarian cancer, hypotyroidism, chroinic UTis and PNA. Patient presented to [facility] on 10/23 with c/o weakness and confusion and was admitted for sepsis due to UTI.   Clinical Impression   This 84 yo female admitted with above presents to acute OT with PLOF per her report of being able to do ~75% of her basic ADLs from a rolling walker level. Current eval limited to pt just back to bed with nursing staff and wanting to eat her supper. She was agreeable to a bed level eval. Pt will benefit from acute OT with follow up [place] and continuance of her 24/7 care by [place].    Follow Up Recommendations  Home health OT;Supervision/Assistance - 24 hour    Equipment Recommendations  None recommended by OT       Precautions / Restrictions Precautions Precautions: Fall Restrictions Weight Bearing Restrictions: No             ADL either performed or assessed with clinical judgement   ADL Overall ADL's : Needs assistance/impaired Eating/Feeding: Independent;Bed level   Grooming: Set up;Wash/dry face;Wash/dry hands;Oral care;Bed level   Upper Body Bathing: Minimal assistance;Bed level   Lower Body Bathing: Total assistance;Bed level   Upper Body Dressing : Maximal assistance;Bed level   Lower Body Dressing: Total assistance;Bed level                       Vision Baseline Vision/History: Wears glasses Wears Glasses: At all times              Pertinent Vitals/Pain Pain Assessment: No/denies pain     Hand Dominance Right   Extremity/Trunk Assessment Upper Extremity Assessment Upper Extremity Assessment: RUE deficits/detail;LUE deficits/detail RUE Deficits / Details: severely limited shoulder  flexion, RUE Coordination: decreased fine motor;decreased gross motor LUE Deficits / Details: severely limited shoulder flexion, RA in hand LUE Coordination: decreased fine motor;decreased gross motor           Communication Communication Communication: No difficulties   Cognition Arousal/Alertness: Awake/alert Behavior During Therapy: WFL for tasks assessed/performed Overall Cognitive Status: Within Functional Limits for tasks assessed                                                Home Living Family/patient expects to be discharged to:: Private residence Living Arrangements: Alone Available Help at Discharge: Family;Personal care attendant;Available 24 hours/day Type of Home: House Home Access: Stairs to enter [employer] of Steps: 2 Entrance Stairs-Rails: None Home Layout: One level     Bathroom Shower/Tub: Occupational psychologist: Standard Bathroom Accessibility: Yes   Home Equipment: Environmental consultant - 2 wheels;Bedside commode;Shower seat   Additional Comments: pt's daughter comes over to help her in the evenings some      Prior Functioning/Environment Level of Independence: Independent with assistive device(s);Needs assistance  Gait / Transfers Assistance Needed: RW for gait/household mobility ADL's / Homemaking Assistance Needed: home aides help with pt getting in/out of shower, help with bathing knees to feet, help with shirt-start underwear/pants and do socks and shoes; help with IADLs. Pt states she does some cooking  OT Problem List: Decreased strength;Decreased range of motion;Impaired balance (sitting and/or standing);Impaired UE functional use;Pain      OT Treatment/Interventions: Self-care/ADL training;DME and/or AE instruction;Patient/family education;Balance training    OT Goals(Current goals can be found in the care plan section) Acute Rehab OT Goals Patient Stated Goal: to go home OT Goal Formulation:  With patient Time For Goal Achievement: 01/28/20 Potential to Achieve Goals: Good  OT Frequency: Min 2X/week              AM-PAC OT "6 Clicks" Daily Activity     Outcome Measure Help from another person eating meals?: None Help from another person taking care of personal grooming?: A Little Help from another person toileting, which includes using toliet, bedpan, or urinal?: A Lot Help from another person bathing (including washing, rinsing, drying)?: A Lot Help from another person to put on and taking off regular upper body clothing?: A Lot Help from another person to put on and taking off regular lower body clothing?: A Lot 6 Click Score: 15   End of Session    Activity Tolerance: Patient tolerated treatment well Patient left: in bed;with call bell/phone within reach;with bed alarm set  OT Visit Diagnosis: Other abnormalities of gait and mobility (R26.89);Muscle weakness (generalized) (M62.81)                Time: 0459-9774 OT Time Calculation (min): 10 min Charges:  OT General Charges $OT Visit: 1 Visit OT Evaluation $OT Eval Low Complexity: 1 Low  [place], OTR/L Acute [employer] Pager [phone] Office [phone]     Almon Register 01/14/2020, 6:16 PM

## 2020-01-14 NOTE — TOC Initial Note (Signed)
Transition of Care [facility]) - Initial/Assessment Note    Patient Details  Name: Kayla Thompson MRN: [id number] Date of Birth: [birth year]  Transition of Care [facility]) CM/SW Contact:    Dessa Phi, RN Phone Number: 01/14/2020, 5:30 PM  Clinical Narrative: Dpoke to patient about d/c plans-from home alone,has aide(private duty). For long term iv abx-invanx through 11/1,await [facility], [facility] rep for iv infusion initial instruction, & med wil follow. Patient prefers [facility] for HHRN/HHPT-[facility] rep will check if able to accept. Has own transport home.                 Expected Discharge Plan: [name] Barriers to Discharge: Continued Medical Work up   Patient Goals and CMS Choice Patient states their goals for this hospitalization and ongoing recovery are:: go home CMS Medicare.gov Compare Post Acute Care list provided to:: Patient Choice offered to / list presented to : Patient  Expected Discharge Plan and Services Expected Discharge Plan: [place]   Discharge Planning Services: CM Consult   Living arrangements for the past 2 months: Single Family Home                                      Prior Living Arrangements/Services Living arrangements for the past 2 months: Single Family Home Lives with:: Self   Do you feel safe going back to the place where you live?: Yes      Need for Family Participation in Patient Care: No (Comment) Care giver support system in place?: Yes (comment) Current home services: Homehealth aide Criminal Activity/Legal Involvement Pertinent to Current Situation/Hospitalization: No - Comment as needed  Activities of Daily Living Home Assistive Devices/Equipment: Walker (specify type), Eyeglasses, CBG Meter ADL Screening (condition at time of admission) Patient's cognitive ability adequate to safely complete daily activities?: No Is the patient deaf or have difficulty hearing?: No Does the patient have  difficulty seeing, even when wearing glasses/contacts?: No Does the patient have difficulty concentrating, remembering, or making decisions?: Yes Patient able to express need for assistance with ADLs?: Yes Does the patient have difficulty dressing or bathing?: Yes Independently performs ADLs?: No Communication: Independent Dressing (OT): Needs assistance Is this a change from baseline?: Pre-admission baseline Grooming: Independent with device (comment) Feeding: Independent Bathing: Needs assistance Is this a change from baseline?: Pre-admission baseline Toileting: Needs assistance Is this a change from baseline?: Change from baseline, expected to last <3 days In/Out Bed: Needs assistance Is this a change from baseline?: Change from baseline, expected to last <3 days Walks in Home: Needs assistance Is this a change from baseline?: Change from baseline, expected to last >3 days Does the patient have difficulty walking or climbing stairs?: Yes Weakness of Legs: Both Weakness of Arms/Hands: None  Permission Sought/Granted Permission sought to share information with : Case Manager Permission granted to share information with : Yes, Verbal Permission Granted  Share Information with NAME: Case Manager           Emotional Assessment Appearance:: Appears stated age Attitude/Demeanor/Rapport: Gracious Affect (typically observed): Accepting Orientation: : Oriented to Self, Oriented to Place, Oriented to  Time, Oriented to Situation Alcohol / Substance Use: Not Applicable Psych Involvement: No (comment)  Admission diagnosis:  Sepsis ([place]) [A41.9] Sepsis, due to unspecified organism, unspecified whether acute organ dysfunction present [facility]) [A41.9] Patient Active Problem List   Diagnosis Date Noted  . Sepsis ([place]) 01/12/2020  .  Lower extremity edema 11/20/2019  . Pre-operative cardiovascular examination 10/18/2019  . UTI symptoms 05/29/2019  . Dysuria 05/11/2019  . Upper esophageal  web 03/27/2019  . Iron deficiency anemia 03/27/2019  . Candida esophagitis ([place]) 02/14/2019  . Encounter for general adult medical examination with abnormal findings 01/09/2019  . Abnormal barium swallow 12/28/2018  . Throat clearing 12/28/2018  . History of colonic polyps 12/28/2018  . Dysphagia 12/28/2018  . Diabetic foot ulcer ([place]) 12/05/2018  . Rotator cuff arthropathy, left 11/16/2018  . Degenerative arthritis of left knee 11/16/2018  . Insomnia 09/26/2018  . Leukocytosis 10/17/2017  . Malnutrition of moderate degree 10/10/2017  . Memory changes 07/29/2017  . Granulomatous lung disease ([place]) 06/14/2017  . Tracheobronchomalacia 06/14/2017  . Dizziness 03/04/2017  . Exercise hypoxemia 07/12/2016  . Diastolic CHF, chronic ([place]) 12/25/2015  . GERD (gastroesophageal reflux disease) 12/25/2015  . Epidermal inclusion cyst 09/02/2015  . Type 2 diabetes mellitus with hyperglycemia, with long-term current use of insulin ([place]) 03/10/2015  . Muscle cramps 03/06/2015  . Cough 01/07/2015  . Bronchiectasis without acute exacerbation ([place]) 01/07/2015  . PMR (polymyalgia rheumatica) ([place]) 01/07/2015  . Osteoarthritis 01/07/2015  . Asthma, chronic 11/12/2014  . Anxiety state 11/12/2014  . Hyperparathyroidism ([place]) 10/28/2014  . Fibromyalgia 09/21/2014  . Personal history of ovarian cancer 08/23/2014  . Osteoporosis 08/23/2014  . Diabetes mellitus with coincident hypertension ([place]) 08/02/2014  . HLD (hyperlipidemia) 08/02/2014  . Hypothyroidism 08/02/2014   PCP:  Marrian Salvage, [place] [facility]:   [id number] - [place], [place] [address] Phone: [phone] Fax: [phone]  [facility], [place] [address] Phone: [phone] Fax: [phone]  [facility], [address]  [place] [address] Phone: [phone] Fax: [phone]     Social Determinants of Health (SDOH) Interventions    Readmission Risk Interventions No flowsheet data found.

## 2020-01-15 ENCOUNTER — Encounter (HOSPITAL_COMMUNITY): Payer: Self-pay | Admitting: Family Medicine

## 2020-01-15 ENCOUNTER — Inpatient Hospital Stay: Payer: Self-pay

## 2020-01-15 DIAGNOSIS — A4151 Sepsis due to Escherichia coli [E. coli]: Secondary | ICD-10-CM | POA: Diagnosis not present

## 2020-01-15 LAB — BASIC METABOLIC PANEL
Anion gap: 8 (ref 5–15)
BUN: 20 mg/dL (ref 8–23)
CO2: 27 mmol/L (ref 22–32)
Calcium: 8.4 mg/dL — ABNORMAL LOW (ref 8.9–10.3)
Chloride: 102 mmol/L (ref 98–111)
Creatinine, Ser: 1.16 mg/dL — ABNORMAL HIGH (ref 0.44–1.00)
GFR, Estimated: 47 mL/min — ABNORMAL LOW (ref >60)
Glucose, Bld: 89 mg/dL (ref 70–99)
Potassium: 3.5 mmol/L (ref 3.5–5.1)
Sodium: 137 mmol/L (ref 135–145)

## 2020-01-15 LAB — CBC
HCT: 30.5 % — ABNORMAL LOW (ref 36.0–46.0)
Hemoglobin: 9.8 g/dL — ABNORMAL LOW (ref 12.0–15.0)
MCH: 34.9 pg — ABNORMAL HIGH (ref 26.0–34.0)
MCHC: 32.1 g/dL (ref 30.0–36.0)
MCV: 108.5 fL — ABNORMAL HIGH (ref 80.0–100.0)
Platelets: 167 10*3/uL (ref 150–400)
RBC: 2.81 MIL/uL — ABNORMAL LOW (ref 3.87–5.11)
RDW: 13.2 % (ref 11.5–15.5)
WBC: 10 10*3/uL (ref 4.0–10.5)
nRBC: 0 % (ref 0.0–0.2)

## 2020-01-15 LAB — CULTURE, BLOOD (ROUTINE X 2)
Special Requests: ADEQUATE
Special Requests: ADEQUATE

## 2020-01-15 LAB — GLUCOSE, CAPILLARY
Glucose-Capillary: 103 mg/dL — ABNORMAL HIGH (ref 70–99)
Glucose-Capillary: 102 mg/dL — ABNORMAL HIGH (ref 70–99)
Glucose-Capillary: 166 mg/dL — ABNORMAL HIGH (ref 70–99)

## 2020-01-15 MED ORDER — SODIUM CHLORIDE 0.9 % IV SOLN
1.0000 g | INTRAVENOUS | Status: DC
  Administered 2020-01-15: 1000 mg via INTRAVENOUS
  Filled 2020-01-15: qty 1

## 2020-01-15 MED ORDER — SODIUM CHLORIDE 0.9% FLUSH: 10.0000 mL | INTRAVENOUS | Status: DC | PRN

## 2020-01-15 MED ORDER — CHLORHEXIDINE GLUCONATE CLOTH 2 % EX PADS
6.0000 | MEDICATED_PAD | Freq: Every day | CUTANEOUS | Status: DC
  Administered 2020-01-15: 6 via TOPICAL

## 2020-01-15 MED ORDER — ENSURE ENLIVE PO LIQD
237.0000 mL | Freq: Two times a day (BID) | ORAL | 12 refills | Status: DC
Start: 2020-01-16 — End: 2022-07-06

## 2020-01-15 MED ORDER — IPRATROPIUM-ALBUTEROL 0.5-2.5 (3) MG/3ML IN SOLN: 3.0000 mL | Freq: Three times a day (TID) | RESPIRATORY_TRACT | Status: DC | PRN

## 2020-01-15 MED ORDER — BUSPIRONE HCL 5 MG PO TABS
5.0000 mg | ORAL_TABLET | Freq: Every day | ORAL | Status: DC | PRN
Start: 2020-01-15 — End: 2020-03-25

## 2020-01-15 MED ORDER — FUROSEMIDE 20 MG PO TABS: 40.0000 mg | ORAL_TABLET | Freq: Every day | ORAL | Status: DC

## 2020-01-15 MED ORDER — ADULT MULTIVITAMIN W/MINERALS CH
1.0000 | ORAL_TABLET | Freq: Every day | ORAL | Status: DC
  Administered 2020-01-15: 1 via ORAL
  Filled 2020-01-15: qty 1

## 2020-01-15 MED ORDER — BACLOFEN 10 MG PO TABS: 5.0000 mg | ORAL_TABLET | Freq: Two times a day (BID) | ORAL | Status: DC | PRN

## 2020-01-15 MED ORDER — LANTUS SOLOSTAR 100 UNIT/ML ~~LOC~~ SOPN: 15.0000 [IU] | PEN_INJECTOR | Freq: Every day | SUBCUTANEOUS | Status: DC

## 2020-01-15 MED ORDER — ERTAPENEM IV (FOR PTA / DISCHARGE USE ONLY): 1.0000 g | INTRAVENOUS | 0 refills | Status: AC

## 2020-01-15 NOTE — Discharge Instructions (Signed)

## 2020-01-15 NOTE — Progress Notes (Signed)
Initial Nutrition Assessment  DOCUMENTATION CODES:   Non-severe (moderate) malnutrition in context of acute illness/injury  INTERVENTION:  - will order Magic Cup with dinner meals, each supplement provides 290 kcal and 9 grams of protein. - continue Ensure Enlive BID, each supplement provides 350 kcal and 20 grams of protein. - will order 1 tablet multivitamin with minerals/day.   NUTRITION DIAGNOSIS:   Moderate Malnutrition related to acute illness, lethargy/confusion as evidenced by mild fat depletion, mild muscle depletion, moderate muscle depletion.  GOAL:   Patient will meet greater than or equal to 90% of their needs  MONITOR:   PO intake, Supplement acceptance, Labs, Weight trends  REASON FOR ASSESSMENT:   Malnutrition Screening Tool  ASSESSMENT:   84 year old female who lives at home and has an aide who assists her during the day and her daughter stays with her at night, ambulates with the help of a walker. She has medical history of asthma/COPD, rheumatoid arthritis, chronic prednisone use, anxiety and depression, type 2 DM, GERD, HTN, hypothyroidism, hx of ovarian cancer, chronic UTI on suppressive antibiotic treatments, recurrent pneumonias. She was recently diagnosed lower extremity edema and chronic CHF. Patient presented to the ED after her granddaughter was watching the cameras in patient's home and noted that patient appeared weak and confused. She was admitted for sepsis 2/2 acute lower UTI with bacteremia.  Patient sleeping at the time of visit but awakes to name call x1. She reports that she is very tired and has been unable to sleep recently. Visit brief d/t this. No family/visitors present. Able to talk with RN briefly about patient.   Flow sheet documentation includes that patient ate 75% of lunch on 10/24, 75% of breakfast, 50% of lunch, and 75% of dinner on 10/25. Ensure Enlive was ordered to start today at 1000.  Weight on 10/23 (date of admission) was 136  lb and weight on 10/11 was 142 lb. This indicates 6 lb weight loss (4.2% body weight) in 2 weeks; significant for time frame.    Labs reviewed; CBG: 103 mg/dl, creatinine: 1.16 mg/dl, Ca: 8.4 mg/dl, GFR: 47 ml/min. Medications reviewed; 1 tablet oscal BID, 4000 units cholecalciferol/day, sliding scale novolog, 15 units lantus/day, 50 mcg oral synthroid/day, 400 mg mag-ox/day, 40 mg oral protonix/day, 2 mg deltasone/day.      NUTRITION - FOCUSED PHYSICAL EXAM:    Most Recent Value  Orbital Region Mild depletion  Upper Arm Region Mild depletion  Thoracic and Lumbar Region Unable to assess  Buccal Region Mild depletion  Temple Region Mild depletion  Clavicle Bone Region Moderate depletion  Clavicle and Acromion Bone Region Moderate depletion  Scapular Bone Region Unable to assess  Dorsal Hand Mild depletion  Patellar Region Unable to assess  Anterior Thigh Region Unable to assess  Posterior Calf Region Unable to assess  Edema (RD Assessment) Unable to assess  Hair Reviewed  Eyes Reviewed  Mouth Reviewed  Skin Reviewed  Nails Reviewed       Diet Order:   Diet Order            Diet Carb Modified Fluid consistency: Thin; Room service appropriate? Yes  Diet effective now                 EDUCATION NEEDS:   No education needs have been identified at this time  Skin:  Skin Assessment: Reviewed RN Assessment  Last BM:  10/23  Height:   Ht Readings from Last 1 Encounters:  01/12/20 4\' 11"  (1.499 m)  Weight:   Wt Readings from Last 1 Encounters:  01/12/20 61.6 kg     Estimated Nutritional Needs:  Kcal:  0045-9977 kcal` Protein:  60-70 grams Fluid:  >/= 1.8 L/day     Kayla Matin, MS, RD, LDN, CNSC Inpatient Clinical Dietitian RD pager # available in [facility]  After hours/weekend pager # available in [facility]

## 2020-01-15 NOTE — Discharge Summary (Addendum)
Physician Discharge Summary  Kayla Thompson WER:[id number] DOB: [birth year]  PCP: Marrian Salvage, FNP  Admitted from: Home Discharged to: Home  Admit date: 01/12/2020 Discharge date: 01/15/2020  Recommendations for Outpatient Follow-up:    Follow-up Information    [facility] Follow up.   Why: rep Pam-tel#[phone];long term iv abx-they will call you;med, supplies.       [place] Follow up.   Specialty: [place] Why: [facility] nursing/physical therapy/occupational therapy Contact information: [address] [place] [address] [phone]        [place], [place], [place]. Schedule an appointment as soon as possible for a visit in 1 week(s).   Specialty: Internal Medicine Why: To be seen with repeat labs (CBC & BMP).  These labs may be drawn by home health services per the OPAT protocol. Contact information: [place] [address] [phone]        Werner Lean, MD. Schedule an appointment as soon as possible for a visit.   Specialty: Cardiology Contact information: [address] [place] [address] [phone]        Franchot Gallo, MD. Schedule an appointment as soon as possible for a visit.   Specialty: Urology Contact information: [place] [address] [phone]                Home Health:  Home Health Orders (From admission, onward)    Start     Ordered   01/15/20 [place]  At discharge       Comments: HHRN-Iv abx long term-invanz thru 11/1.  Question Answer Comment  To provide the following care/treatments PT   To provide the following care/treatments RN   To provide the following care/treatments OT      01/15/20 1004          Equipment/Devices: None    Discharge Condition: Improved and stable   Code Status: DNR Diet recommendation:  Discharge Diet Orders (From admission, onward)    Start     Ordered    01/15/20 0000  Diet - low sodium heart healthy        01/15/20 1608   01/15/20 0000  Diet Carb Modified        01/15/20 1608           Discharge Diagnoses:  Active Problems:   Sepsis [facility])   Brief Summary: 84 year old female, lives alone at home, has an aide who assists her during the day and her daughter stays with her at night, ambulates with the help of a walker, PMH of asthma/COPD on as needed home oxygen,?  Rheumatoid arthritis, chronic prednisone use, anxiety and depression, type II DM, GERD, HTN, hypothyroid, ovarian cancer, chronic UTI on suppressive antibiotic treatments, recurrent pneumonias, recently diagnosed lower extremity edema/chronic diastolic CHF, presented to the ED with complaints of confusion and weakness and admitted for sepsis due to ESBL E. coli acute lower UTI with bacteremia.  Additional admission history from granddaughter:  Patient's granddaughter at bedside provided substantial history.  She has access to cameras installed in her grandmother's house.  On morning of 10/23, she noted on camera that patient was confused and had called life alert.  When she went to the patient's house, patient was in her bed, confused, noted that she had swelling of her lower extremities with bluish discoloration.  She tried to move her to a chair and elevate her legs but could  not manage this by herself.  She then noticed that patient started to have some dyspnea, upper extremity and?  Lips bluish discoloration following which she activated EMS.  She reports patient looks much better today.   Assessment & Plan:  Active Problems:   Sepsis ([place])   Sepsis due to ESBL Ecoli acute lower UTI with bacteremia, POA: Met sepsis criteria on admission including fever of 101.5, tachypnea in the 20s-30s, tachycardia in the 110s, hypoxia requiring oxygen in the ED, leukocytosis and urine microscopy suggestive of UTI.  Lactate normal.  Procalcitonin: 59.59.  Random cortisol: 15.8.   Received IV fluid bolus per sepsis protocol but aggressive IV fluids was not continued due to concern for CHF and related hypoxia.  Had been empirically started on cefepime, metronidazole and vancomycin.  However after preliminary blood culture showed ESBL E. coli, she was quickly transitioned to  meropenem.  Clinically improved.  No fevers since night of admission almost 3 days ago.  Leukocytosis resolved.  Mental status changes have improved but not completely resolved.  ID consultation appreciated, recommended PICC line placement and to treat through 01/21/2020 with ertapenem daily at home and they placed OPAT orders.  PICC line placed 10/26 and can be pulled once antibiotics are completed on 01/21/2020.  I discussed in detail with Dr. Baxter Flattery, cleared for discharge today and okay to resume methenamine at discharge and close outpatient follow-up with her primary Urologist for further evaluation and management.  She did not recommend specific suppressive antibiotic at this time due to risk of further resistance. Sepsis resolved.  Patient's microscopic hematuria is likely related to the UTI.  Recommend repeating urine microscopy in a couple of weeks to ensure resolution.  Acute kidney injury: Likely due to sepsis, some dehydration and recent Lasix use.  Temporarily hold Lasix.  Clinically not volume overloaded.  Creatinine had gone from 0.87 on 10/19-1.39 on admission to 1.51 on 10/24 bladder scan: 175 mL/no urinary retention. Avoid nephrotoxic's.  Briefly hydrated with gentle IV fluids.  Creatinine has improved to 1.16.  As discussed with patient's daughter in detail today, continue to hold Lasix for additional 48 hours given recent acute illness, AKI etc. to allow for some more recovery before resuming Lasix.  Follow BMP closely as outpatient.  Hypokalemia: Replaced.  Acute metabolic encephalopathy: Secondary to sepsis and AKI.  CT head without acute findings.  As per discussion with patient's daughter  who visited her last night in the hospital room, although better than on admission, still has persisting mental status changes/confusion.  Advised that this may take several days or even a week or 2 to completely resolve.  This is likely multifactorial from sepsis, AKI, hospitalization etc. which may be complicating undiagnosed mild cognitive disorder/dementia which can be further evaluated during outpatient follow-up after current acute phase has completely resolved.  No focal neurological deficits.  Offered MRI brain to evaluate for possible stroke (although felt low yield at this time) but daughter declined and wishes to pursue outpatient if needed.  Also daughter indicated that the will arrange 24/7 supervision and assistance for patient at home post discharge  Lower extremity edema with concern for venous insufficiency/concern for HFpEF Seen by Dr. Gasper Sells, Cardioloogy on 12/21/2019.  He indicated lower extremity edema with concern for venous insufficiency.  TTE: YBOF-75%, grade 1 diastolic dysfunction, no mitral valve stenosis and has mild aortic valve stenosis.  Bilateral lower extremity arterial Dopplers: ABI within normal limits.  Bilateral toe brachial index abnormal.  Had not started using compression  stockings, placed compression stockings here but unable to tolerate indicating that they were tight.  Hold Lasix that had been initiated, due to AKI and clinically does not appear to be in overt CHF at this time.  He indicated that if there was no improvement in follow-up then would send to vein/vascular specialist for evaluation.  Lower extremity venous Dopplers negative for DVT.  No clinical features suggestive of cellulitis and hence discontinued vancomycin.  Although BNP 264 on admission, clinically not in overt CHF at this time.  As indicated above, may resume Lasix in 48 hours.  As per RN, patient is not hypoxic and saturating >95% on room air both at rest and with activity and hence does not  qualify for home oxygen at this time.  Elevated troponin due to demand ischemia HS troponin 57 > 69.  No anginal symptoms.  Likely demand ischemia from sepsis.  Anemia: Hemoglobin 9.9, 11.5 in September, may be related to acute illness or hemodilution.  No bleeding reported.  Hemoglobin stable in the 9 g range.  Thrombocytopenia: No bleeding reported.  Could be related to sepsis.    Resolved.  Type II DM with hyperglycemia: Mildly uncontrolled, probably from IV hydrocortisone she got on admission.  Continued Lantus, SSI and adjust insulins as needed.  Reasonable inpatient control.  Prior home regimen at discharge.  Essential hypertension/hypotension: Was hypotensive overnight of admission, s/p stress dose IV hydrocortisone, IV fluid bolus.  Controlled.  Resume carvedilol at discharge  Asthma/COPD, steroid-dependent and as needed oxygen at home Follows with Dr. Baird Lyons, pulmonology.  On chronic prednisone 2 mg daily.  Received IV hydrocortisone 50 mg every 6 hours x3 doses/stress steroids on admission.  Now back on home prednisone.  No clinical bronchospasm.  Continue Trelegy and DuoNeb's.  Wean off oxygen as tolerated.?  Peripheral cyanosis at home when granddaughter found her. Daughter indicates that although patient has oxygen at home, she has never used it before.  Currently not hypoxic and does not qualify for home oxygen.  Hypothyroidism: Continue Synthroid.  ?  Rheumatoid arthritis Patient reports that she follows with Dr. Kathlene November, Rheumatology and feels that she was told that she has?  Rheumatoid arthritis and may be on prednisone for this rather than pulmonary indication.  No acute flare.  Left shoulder pain, chronic States that she has rotator cuff issues, was supposed to have surgery in September by [facility] orthopedics but was postponed due to COVID-19 pandemic.  Outpatient follow-up.  Anxiety and depression: Continue prior home dose of Cymbalta 90 mg daily,  amitriptyline, as needed BuSpar.  Body mass index is 27.43 kg/m.      Consultants:   Infectious disease  Procedures:   RUE PICC line placed 01/15/2020   Discharge Instructions  Discharge Instructions    (HEART FAILURE PATIENTS) Call MD:  Anytime you have any of the following symptoms: 1) 3 pound weight gain in 24 hours or 5 pounds in 1 week 2) shortness of breath, with or without a dry hacking cough 3) swelling in the hands, feet or stomach 4) if you have to sleep on extra pillows at night in order to breathe.   Complete by: As directed    [facility] pharmacist to adjust dose for Vancomycin, Aminoglycosides and other anti-infective therapies as requested by physician.   Complete by: As directed    [facility] to provide Cath Flo 42m   Complete by: As directed    Administer for PICC line occlusion and as ordered by  physician for other access device issues.   Anaphylaxis Kit: Provided to treat any anaphylactic reaction to the medication being provided to the patient if First Dose or when requested by physician   Complete by: As directed    Epinephrine 8m/ml vial / amp: Administer 0.369m(0.82m36msubcutaneously once for moderate to severe anaphylaxis, nurse to call physician and pharmacy when reaction occurs and call 911 if needed for immediate care   Diphenhydramine 73m47m IV vial: Administer 25-73mg482mIM PRN for first dose reaction, rash, itching, mild reaction, nurse to call physician and pharmacy when reaction occurs   Sodium Chloride 0.9% NS 500ml 44mAdminister if needed for hypovolemic blood pressure drop or as ordered by physician after call to physician with anaphylactic reaction   Call MD for:   Complete by: As directed    Recurrent or worsening confusion or altered mental status.   Call MD for:  difficulty breathing, headache or visual disturbances   Complete by: As directed    Call MD for:  extreme fatigue   Complete by: As directed    Call MD  for:  persistant dizziness or light-headedness   Complete by: As directed    Call MD for:  persistant nausea and vomiting   Complete by: As directed    Call MD for:  severe uncontrolled pain   Complete by: As directed    Call MD for:  temperature >100.4   Complete by: As directed    Change dressing on IV access line weekly and PRN   Complete by: As directed    Diet - low sodium heart healthy   Complete by: As directed    Diet Carb Modified   Complete by: As directed    Flush IV access with Sodium Chloride 0.9% and Heparin 10 units/ml or 100 units/ml   Complete by: As directed    Home infusion instructions - [facility]   Complete by: As directed    Instructions: Flush IV access with Sodium Chloride 0.9% and Heparin 10units/ml or 100units/ml   Change dressing on IV access line: Weekly and PRN   Instructions Cath Flo 2mg: A42mnister for PICC Line occlusion and as ordered by physician for other access device   [facility] pharmacist to adjust dose for: Vancomycin, Aminoglycosides and other anti-infective therapies as requested by physician   Increase activity slowly   Complete by: As directed    Method of administration may be changed at the discretion of home infusion pharmacist based upon assessment of the patient and/or caregiver's ability to self-administer the medication ordered   Complete by: As directed        Medication List    TAKE these medications   albuterol 108 (90 Base) MCG/ACT inhaler Commonly known as: ProAir HFA Inhale 2 puffs into the lungs every 6 (six) hours as needed for wheezing or shortness of breath.   amitriptyline 10 MG tablet Commonly known as: ELAVIL Take 1 tablet (10 mg total) by mouth at bedtime.   baclofen 10 MG tablet Commonly known as: LIORESAL TAKE 0.5-1 TABLET BY MOUTH TWICE DAILY AS NEEDED FOR MUSCLE SPASMS OR PAIN What changed: See the new instructions.   BD Pen Needle Nano 2nd Gen 32G X 4 MM Misc Generic drug:  Insulin Pen Needle USE AS DIRECTED 4 TIMES A DAY   busPIRone 5 MG tablet Commonly known as: BUSPAR Take 1-2 tablets (5-10 mg total) by mouth daily as needed. Anxiety   calcium carbonate 1500 (600 Ca) MG Tabs  tablet Commonly known as: OSCAL Take 600 mg by mouth 2 (two) times daily with a meal.   carvedilol 3.125 MG tablet Commonly known as: COREG Take 1 tablet (3.125 mg total) by mouth 2 (two) times daily with a meal.   CENTRUM ADULTS PO Take 1 tablet by mouth daily.   DULoxetine 30 MG capsule Commonly known as: CYMBALTA Take 1 capsule (30 mg total) by mouth daily. Plan to see her PCP for further refills   DULoxetine 60 MG capsule Commonly known as: Cymbalta Take 1 capsule (60 mg total) by mouth daily.   ertapenem  IVPB Commonly known as: INVANZ Inject 1 g into the vein daily for 6 days. Indication:  ESBL Bacteremia First Dose: No Last Day of Therapy:  01/21/2020 Labs - Once weekly:  CBC/D and BMP, Labs - Every other week:  ESR and CRP Method of administration: Mini-Bag Plus / Gravity Method of administration may be changed at the discretion of home infusion pharmacist based upon assessment of the patient and/or caregiver's ability to self-administer the medication ordered.   feeding supplement Liqd Take 237 mLs by mouth 2 (two) times daily between meals. Start taking on: January 16, 2020   ferrous gluconate 324 MG tablet Commonly known as: FERGON TAKE 1 TABLET BY MOUTH EVERY DAY WITH BREAKFAST What changed: See the new instructions.   freestyle lancets Use to test blood sugar 3 times daily. Dx: E11.65   furosemide 20 MG tablet Commonly known as: LASIX Take 2 tablets (40 mg total) by mouth daily. Start taking on: January 17, 2020 What changed: These instructions start on January 17, 2020. If you are unsure what to do until then, ask your doctor or other care provider.   glucose blood test strip Commonly known as: FREESTYLE TEST STRIPS Use to test blood sugar 3  times daily. Dx: E11.65   HYDROcodone-acetaminophen 10-325 MG tablet Commonly known as: NORCO Take 1 tablet by mouth every 4 (four) hours as needed.   ipratropium-albuterol 0.5-2.5 (3) MG/3ML Soln Commonly known as: DUONEB Inhale 3 mLs into the lungs 3 (three) times daily as needed (shortness of breath). USE 1 VIAL IN NEBULIZER 3 TIMES DAILY   Lantus SoloStar 100 UNIT/ML Solostar Pen Generic drug: insulin glargine Inject 15 Units into the skin daily. What changed: See the new instructions.   levothyroxine 50 MCG tablet Commonly known as: SYNTHROID TAKE 1 TABLET BY MOUTH EVERY DAY BEFORE BREAKFAST What changed: See the new instructions.   lidocaine 5 % Commonly known as: LIDODERM Place 1 patch onto the skin every 12 (twelve) hours as needed. pain   lidocaine 5 % ointment Commonly known as: XYLOCAINE Apply 1 application topically as needed. Apply to external vulvar area   magnesium oxide 400 MG tablet Commonly known as: MAG-OX TAKE 1 TABLET BY MOUTH EVERY DAY   methenamine 1 g tablet Commonly known as: MANDELAMINE Take 1,000 mg by mouth 2 (two) times daily. Cont.   montelukast 10 MG tablet Commonly known as: SINGULAIR TAKE 1 TABLET BY MOUTH EVERY DAY What changed: when to take this   Mucinex DM Maximum Strength 60-1200 MG Tb12 Take 1 tablet by mouth 2 (two) times daily. Reported on 06/23/2015   omeprazole 20 MG capsule Commonly known as: PRILOSEC TAKE 1 CAPSULE BY MOUTH 2 TIMES A DAY BEFORE A MEAL What changed: See the new instructions.   predniSONE 1 MG tablet Commonly known as: DELTASONE Take 2 tablets (2 mg total) by mouth daily.   rosuvastatin 10 MG tablet  Commonly known as: CRESTOR Take 1 tablet (10 mg total) by mouth every evening.   TART CHERRY ADVANCED PO Take by mouth. Once daily   Trelegy Ellipta 100-62.5-25 MCG/INH Aepb Generic drug: Fluticasone-Umeclidin-Vilant Inhale 1 puff into the lungs daily. Rinse mouth   UNABLE TO FIND Take 1,000 mg by  mouth daily. Med Name: Tumeric   vitamin B-12 250 MCG tablet Commonly known as: CYANOCOBALAMIN Take 250 mcg by mouth See admin instructions. Twice weekly  ( Monday & Thursday )   Vitamin D3 50 MCG (2000 UT) Tabs Take 2 tablets by mouth daily.      Allergies  Allergen Reactions  . Latex Rash  . Penicillins Rash    Has patient had a PCN reaction causing immediate rash, facial/tongue/throat swelling, SOB or lightheadedness with hypotension: No Has patient had a PCN reaction causing severe rash involving mucus membranes or skin necrosis: No Has patient had a PCN reaction that required hospitalization: No Has patient had a PCN reaction occurring within the last 10 years: No If all of the above answers are "NO", then may proceed with Cephalosporin use.       Procedures/Studies: CT Head Wo Contrast  Result Date: 01/12/2020 CLINICAL DATA:  Altered mental status EXAM: CT HEAD WITHOUT CONTRAST TECHNIQUE: Contiguous axial images were obtained from the base of the skull through the vertex without intravenous contrast. COMPARISON:  12/24/2019 FINDINGS: Brain: No evidence of acute infarction, hemorrhage, hydrocephalus, extra-axial collection or mass lesion/mass effect. Mild low-density changes within the periventricular and subcortical white matter compatible with chronic microvascular ischemic change. Mild diffuse cerebral volume loss. Vascular: Atherosclerotic calcifications involving the large vessels of the skull base. No unexpected hyperdense vessel. Skull: Normal. Negative for fracture or focal lesion. Sinuses/Orbits: No acute finding. Other: None. IMPRESSION: 1. No acute intracranial findings.  Stable exam from 12/24/2019. 2. Chronic microvascular ischemic change and cerebral volume loss. Electronically Signed   By: Davina Poke D.O.   On: 01/12/2020 12:17    DG Chest Port 1 View  Result Date: 01/12/2020 CLINICAL DATA:  Shortness of breath. EXAM: PORTABLE CHEST 1 VIEW COMPARISON:   November 20, 2019. FINDINGS: The heart size and mediastinal contours are within normal limits. Both lungs are clear. No pneumothorax or pleural effusion is noted. The visualized skeletal structures are unremarkable. IMPRESSION: No active disease. Aortic Atherosclerosis (ICD10-I70.0). Electronically Signed   By: Marijo Conception M.D.   On: 01/12/2020 11:30   VAS [place] LOWER EXTREMITY VENOUS (DVT)  Result Date: 01/14/2020  Lower Venous DVTStudy Indications: Edema.  Limitations: Poor ultrasound/tissue interface. Comparison Study: No prior study Performing Technologist: Maudry Mayhew MHA, RDMS, RVT, RDCS  Examination Guidelines: A complete evaluation includes B-mode imaging, spectral Doppler, color Doppler, and power Doppler as needed of all accessible portions of each vessel. Bilateral testing is considered an integral part of a complete examination. Limited examinations for reoccurring indications may be performed as noted. The reflux portion of the exam is performed with the patient in reverse Trendelenburg.  +---------+---------------+---------+-----------+----------+--------------+ RIGHT    CompressibilityPhasicitySpontaneityPropertiesThrombus Aging +---------+---------------+---------+-----------+----------+--------------+ CFV      Full           No       Yes                                 +---------+---------------+---------+-----------+----------+--------------+ SFJ      Full                                                        +---------+---------------+---------+-----------+----------+--------------+  FV Prox  Full                                                        +---------+---------------+---------+-----------+----------+--------------+ FV Mid   Full                                                        +---------+---------------+---------+-----------+----------+--------------+ FV DistalFull                                                         +---------+---------------+---------+-----------+----------+--------------+ PFV      Full                                                        +---------+---------------+---------+-----------+----------+--------------+ POP      Full           Yes      Yes                                 +---------+---------------+---------+-----------+----------+--------------+ PTV      Full                                                        +---------+---------------+---------+-----------+----------+--------------+   Right Technical Findings: Not visualized segments include peroneal veins.  +---------+---------------+---------+-----------+----------+--------------+ LEFT     CompressibilityPhasicitySpontaneityPropertiesThrombus Aging +---------+---------------+---------+-----------+----------+--------------+ CFV      Full           No       Yes                                 +---------+---------------+---------+-----------+----------+--------------+ SFJ      Full                                                        +---------+---------------+---------+-----------+----------+--------------+ FV Prox  Full                                                        +---------+---------------+---------+-----------+----------+--------------+ FV Mid   Full                                                        +---------+---------------+---------+-----------+----------+--------------+  FV DistalFull                                                        +---------+---------------+---------+-----------+----------+--------------+ PFV      Full                                                        +---------+---------------+---------+-----------+----------+--------------+ POP      Full           Yes      Yes                                 +---------+---------------+---------+-----------+----------+--------------+ PTV      Full                                                         +---------+---------------+---------+-----------+----------+--------------+ PERO     Full                                                        +---------+---------------+---------+-----------+----------+--------------+   Left Technical Findings: Not visualized segments include Limited evaluation of calf veins.   Summary: RIGHT: - There is no evidence of deep vein thrombosis in the lower extremity. However, portions of this examination were limited- see technologist comments above.  - No cystic structure found in the popliteal fossa.  LEFT: - There is no evidence of deep vein thrombosis in the lower extremity. However, portions of this examination were limited- see technologist comments above.  - No cystic structure found in the popliteal fossa.  *See table(s) above for measurements and observations. Electronically signed by Ruta Hinds MD on 01/14/2020 at 6:23:37 PM.    Final    [place] EKG SITE RITE  Result Date: 01/15/2020 If Site Rite image not attached, placement could not be confirmed due to current cardiac rhythm.     Subjective: Patient awake, alert and oriented to person, place and partly to time, "October 2021".  Indicated that she was happy that I spoke with her daughters yesterday.  Denied complaints.  Denied dyspnea, chest pain, or pain elsewhere.  Did not report pain in her legs either.  No fever or chills.  As per RN, no acute issues noted.   Discharge Exam:  Vitals:   01/15/20 0528 01/15/20 0848 01/15/20 1132 01/15/20 1636  BP: 125/73  135/72   Pulse: 95  96   Resp: 18  20   Temp: 97.8 F (36.6 C)  99.2 F (37.3 C)   TempSrc:      SpO2: 98% 94% 98% 96%  Weight:      Height:        General exam: Pleasant elderly female, moderately built and nourished lying comfortably propped up in bed without distress.  Oral mucosa  moist. Respiratory system:  Clear to auscultation.  No increased work of breathing. Cardiovascular system: S1 & S2 heard,  RRR. No JVD, murmurs, rubs, gallops or clicks.  No pedal edema.  Telemetry personally reviewed: Sinus rhythm. Gastrointestinal system: Abdomen is nondistended, soft and nontender. No organomegaly or masses felt. Normal bowel sounds heard. Central nervous system:  Mental status as noted above. No focal neurological deficits. Extremities: Symmetric 5 x 5 power. Skin: No rashes, lesions or ulcers Psychiatry: Judgement and insight appear somewhat impaired. Mood & affect pleasant and appropriate.     The results of significant diagnostics from this hospitalization (including imaging, microbiology, ancillary and laboratory) are listed below for reference.     Microbiology: Recent Results (from the past 240 hour(s))  Respiratory Panel by RT PCR (Flu A&B, Covid) - Nasopharyngeal Swab     Status: None   Collection Time: 01/12/20 10:49 AM   Specimen: Nasopharyngeal Swab  Result Value Ref Range Status   SARS Coronavirus 2 by RT PCR NEGATIVE NEGATIVE Final    Comment: (NOTE) SARS-CoV-2 target nucleic acids are NOT DETECTED.  The SARS-CoV-2 RNA is generally detectable in upper respiratoy specimens during the acute phase of infection. The lowest concentration of SARS-CoV-2 viral copies this assay can detect is 131 copies/mL. A negative result does not preclude SARS-Cov-2 infection and should not be used as the sole basis for treatment or other patient management decisions. A negative result may occur with  improper specimen collection/handling, submission of specimen other than nasopharyngeal swab, presence of viral mutation(s) within the areas targeted by this assay, and inadequate number of viral copies (<131 copies/mL). A negative result must be combined with clinical observations, patient history, and epidemiological information. The expected result is Negative.  Fact Sheet for Patients:  PinkCheek.be  Fact Sheet for Healthcare Providers:   GravelBags.it  This test is no t yet approved or cleared by the [place] FDA and  has been authorized for detection and/or diagnosis of SARS-CoV-2 by FDA under an Emergency Use Authorization (EUA). This EUA will remain  in effect (meaning this test can be used) for the duration of the COVID-19 declaration under Section 564(b)(1) of the Act, 21 U.S.C. section 360bbb-3(b)(1), unless the authorization is terminated or revoked sooner.     Influenza A by PCR NEGATIVE NEGATIVE Final   Influenza B by PCR NEGATIVE NEGATIVE Final    Comment: (NOTE) The Xpert Xpress SARS-CoV-2/FLU/RSV assay is intended as an aid in  the diagnosis of influenza from Nasopharyngeal swab specimens and  should not be used as a sole basis for treatment. Nasal washings and  aspirates are unacceptable for Xpert Xpress SARS-CoV-2/FLU/RSV  testing.  Fact Sheet for Patients: PinkCheek.be  Fact Sheet for Healthcare Providers: GravelBags.it  This test is not yet approved or cleared by the [place] FDA and  has been authorized for detection and/or diagnosis of SARS-CoV-2 by  FDA under an Emergency Use Authorization (EUA). This EUA will remain  in effect (meaning this test can be used) for the duration of the  Covid-19 declaration under Section 564(b)(1) of the Act, 21  U.S.C. section 360bbb-3(b)(1), unless the authorization is  terminated or revoked. Performed at [facility], [place] [address] [place], [place] [address]   Blood culture (routine x 2)     Status: Abnormal   Collection Time: 01/12/20 11:29 AM   Specimen: BLOOD  Result Value Ref Range Status   Specimen Description   Final    BLOOD RIGHT ANTECUBITAL Performed at  [facility], [place] [address], [place], [place] [address]    Special Requests   Final    BOTTLES DRAWN AEROBIC AND ANAEROBIC Blood Culture adequate  volume Performed at [facility] [address], [place], [place] [address]    Culture  Setup Time   Final    GRAM NEGATIVE RODS ANAEROBIC BOTTLE ONLY CRITICAL RESULT CALLED TO, READ BACK BY AND VERIFIED WITH: PHARMD Melodye Ped [phone] MLM Performed at [facility], [place] [address] [place], [place] [address]    Culture (A)  Final    ESCHERICHIA COLI Confirmed Extended Spectrum Beta-Lactamase Producer (ESBL).  In bloodstream infections from ESBL organisms, carbapenems are preferred over piperacillin/tazobactam. They are shown to have a lower risk of mortality.    Report Status 01/15/2020 FINAL  Final   Organism ID, Bacteria ESCHERICHIA COLI  Final      Susceptibility   Escherichia coli - MIC*    AMPICILLIN >=32 RESISTANT Resistant     CEFAZOLIN >=64 RESISTANT Resistant     CEFEPIME 8 INTERMEDIATE Intermediate     CEFTAZIDIME RESISTANT Resistant     CEFTRIAXONE >=64 RESISTANT Resistant     CIPROFLOXACIN 1 SENSITIVE Sensitive     GENTAMICIN >=16 RESISTANT Resistant     IMIPENEM <=0.25 SENSITIVE Sensitive     TRIMETH/SULFA >=320 RESISTANT Resistant     AMPICILLIN/SULBACTAM >=32 RESISTANT Resistant     PIP/TAZO <=4 SENSITIVE Sensitive     * ESCHERICHIA COLI  Blood Culture ID Panel (Reflexed)     Status: Abnormal   Collection Time: 01/12/20 11:29 AM  Result Value Ref Range Status   Enterococcus faecalis NOT DETECTED NOT DETECTED Final   Enterococcus Faecium NOT DETECTED NOT DETECTED Final   Listeria monocytogenes NOT DETECTED NOT DETECTED Final   Staphylococcus species NOT DETECTED NOT DETECTED Final   Staphylococcus aureus (BCID) NOT DETECTED NOT DETECTED Final   Staphylococcus epidermidis NOT DETECTED NOT DETECTED Final   Staphylococcus lugdunensis NOT DETECTED NOT DETECTED Final   Streptococcus species NOT DETECTED NOT DETECTED Final   Streptococcus agalactiae NOT DETECTED NOT DETECTED Final   Streptococcus pneumoniae NOT DETECTED NOT DETECTED Final    Streptococcus pyogenes NOT DETECTED NOT DETECTED Final   A.calcoaceticus-baumannii NOT DETECTED NOT DETECTED Final   Bacteroides fragilis NOT DETECTED NOT DETECTED Final   Enterobacterales DETECTED (A) NOT DETECTED Final    Comment: Enterobacterales represent a large order of gram negative bacteria, not a single organism. CRITICAL RESULT CALLED TO, READ BACK BY AND VERIFIED WITH: PHARMD J GADHIA [phone] MLM    Enterobacter cloacae complex NOT DETECTED NOT DETECTED Final   Escherichia coli DETECTED (A) NOT DETECTED Final    Comment: CRITICAL RESULT CALLED TO, READ BACK BY AND VERIFIED WITH: PHARMD J GADHIA [phone] MLM    Klebsiella aerogenes NOT DETECTED NOT DETECTED Final   Klebsiella oxytoca NOT DETECTED NOT DETECTED Final   Klebsiella pneumoniae NOT DETECTED NOT DETECTED Final   Proteus species NOT DETECTED NOT DETECTED Final   Salmonella species NOT DETECTED NOT DETECTED Final   Serratia marcescens NOT DETECTED NOT DETECTED Final   Haemophilus influenzae NOT DETECTED NOT DETECTED Final   Neisseria meningitidis NOT DETECTED NOT DETECTED Final   Pseudomonas aeruginosa NOT DETECTED NOT DETECTED Final   Stenotrophomonas maltophilia NOT DETECTED NOT DETECTED Final   Candida albicans NOT DETECTED NOT DETECTED Final   Candida auris NOT DETECTED NOT DETECTED Final   Candida glabrata NOT DETECTED NOT DETECTED Final  Candida krusei NOT DETECTED NOT DETECTED Final   Candida parapsilosis NOT DETECTED NOT DETECTED Final   Candida tropicalis NOT DETECTED NOT DETECTED Final   Cryptococcus neoformans/gattii NOT DETECTED NOT DETECTED Final   CTX-M ESBL DETECTED (A) NOT DETECTED Final    Comment: CRITICAL RESULT CALLED TO, READ BACK BY AND VERIFIED WITH: PHARMD J GADHIA [phone] MLM (NOTE) Extended spectrum beta-lactamase detected. Recommend a carbapenem as initial therapy.      Carbapenem resistance IMP NOT DETECTED NOT DETECTED Final   Carbapenem resistance KPC NOT DETECTED  NOT DETECTED Final   Carbapenem resistance NDM NOT DETECTED NOT DETECTED Final   Carbapenem resist OXA 48 LIKE NOT DETECTED NOT DETECTED Final   Carbapenem resistance VIM NOT DETECTED NOT DETECTED Final    Comment: Performed at [facility], [place] [address] [place], [place] [address]  Urine culture     Status: Abnormal   Collection Time: 01/12/20 12:33 PM   Specimen: Urine, Random  Result Value Ref Range Status   Specimen Description   Final    URINE, RANDOM Performed at [facility] [address], [place], [place] [address]    Special Requests   Final    URINE, RANDOM Performed at [facility] [address], [place], [place] [address]    Culture (A)  Final    >=100,000 COLONIES/mL ESCHERICHIA COLI Confirmed Extended Spectrum Beta-Lactamase Producer (ESBL).  In bloodstream infections from ESBL organisms, carbapenems are preferred over piperacillin/tazobactam. They are shown to have a lower risk of mortality.    Report Status 01/14/2020 FINAL  Final   Organism ID, Bacteria ESCHERICHIA COLI (A)  Final      Susceptibility   Escherichia coli - MIC*    AMPICILLIN >=32 RESISTANT Resistant     CEFAZOLIN >=64 RESISTANT Resistant     CEFTRIAXONE >=64 RESISTANT Resistant     CIPROFLOXACIN 1 SENSITIVE Sensitive     GENTAMICIN >=16 RESISTANT Resistant     IMIPENEM <=0.25 SENSITIVE Sensitive     NITROFURANTOIN <=16 SENSITIVE Sensitive     TRIMETH/SULFA >=320 RESISTANT Resistant     AMPICILLIN/SULBACTAM >=32 RESISTANT Resistant     PIP/TAZO <=4 SENSITIVE Sensitive     * >=100,000 COLONIES/mL ESCHERICHIA COLI  Blood culture (routine x 2)     Status: Abnormal   Collection Time: 01/12/20 12:33 PM   Specimen: BLOOD LEFT FOREARM  Result Value Ref Range Status   Specimen Description   Final    BLOOD LEFT FOREARM Performed at [facility], [place] [address], [place], [place] [address]    Special Requests   Final    BOTTLES DRAWN AEROBIC AND  ANAEROBIC Blood Culture adequate volume Performed at [facility] [address] [place], [place] [address]    Culture  Setup Time   Final    GRAM NEGATIVE RODS IN BOTH AEROBIC AND ANAEROBIC BOTTLES CRITICAL RESULT CALLED TO, READ BACK BY AND VERIFIED WITH: PHARMD J GADHIA [phone] MLM    Culture (A)  Final    ESCHERICHIA COLI SUSCEPTIBILITIES PERFORMED ON PREVIOUS CULTURE WITHIN THE LAST 5 DAYS. Performed at [facility], [place] [address], [place], [place] [address]    Report Status 01/15/2020 FINAL  Final     Labs: CBC: Recent Labs  Lab 01/12/20 1048 01/13/20 0519 01/13/20 0726 01/14/20 0536 01/15/20 0455  WBC QUESTIONABLE RESULTS, RECOMMEND RECOLLECT TO VERIFY 17.2* 18.9* 16.5* 10.0  NEUTROABS QUESTIONABLE RESULTS, RECOMMEND RECOLLECT TO VERIFY  --   --   --   --  HGB QUESTIONABLE RESULTS, RECOMMEND RECOLLECT TO VERIFY 9.4* 9.9* 9.0* 9.8*  HCT QUESTIONABLE RESULTS, RECOMMEND RECOLLECT TO VERIFY 29.6* 31.0* 27.7* 30.5*  MCV QUESTIONABLE RESULTS, RECOMMEND RECOLLECT TO VERIFY 112.5* 111.5* 109.5* 108.5*  PLT QUESTIONABLE RESULTS, RECOMMEND RECOLLECT TO VERIFY 135* 140* 145* 884    Basic Metabolic Panel: Recent Labs  Lab 01/12/20 1048 01/12/20 1513 01/13/20 0519 01/14/20 0536 01/15/20 0455  NA 133*  --  136 137 137  K 4.3  --  3.9 3.1* 3.5  CL 95*  --  100 102 102  CO2 22  --  _0 GLUCOSE 200*  --  197* 110* 89  BUN 29*  --  31* 31* 20  CREATININE 1.39*  --  1.51* 1.29* 1.16*  CALCIUM 8.7*  --  8.2* 8.2* 8.4*  MG  --  2.0  --   --   --     Liver Function Tests: Recent Labs  Lab 01/13/20 0519  AST 31  ALT 21  ALKPHOS 52  BILITOT 0.9  PROT 5.7*  ALBUMIN 2.9*    CBG: Recent Labs  Lab 01/14/20 1209 01/14/20 1631 01/14/20 2117 01/15/20 0726 01/15/20 1130  GLUCAP 188* 108* 179* 103* 102*    Hgb A1c Recent Labs    01/13/20 0519  HGBA1C 6.6*    Urinalysis    Component Value Date/Time   COLORURINE YELLOW  01/12/2020 1233   APPEARANCEUR CLOUDY (A) 01/12/2020 1233   LABSPEC 1.018 01/12/2020 1233   PHURINE 5.0 01/12/2020 1233   GLUCOSEU NEGATIVE 01/12/2020 1233   HGBUR MODERATE (A) 01/12/2020 1233   BILIRUBINUR NEGATIVE 01/12/2020 1233   BILIRUBINUR 1+ 06/13/2019 1531   KETONESUR 20 (A) 01/12/2020 1233   PROTEINUR 100 (A) 01/12/2020 1233   UROBILINOGEN 0.2 06/13/2019 1531   UROBILINOGEN 0.2 08/02/2014 0231   NITRITE POSITIVE (A) 01/12/2020 1233   LEUKOCYTESUR LARGE (A) 01/12/2020 1233     I discussed in detail with patient's 2 daughters individually this morning via phone, updated care and answered all questions.  Time coordinating discharge: 45 minutes  SIGNED:  Vernell Leep, MD, [place], [facility]. [facility]  To contact the attending provider between 7A-7P or the covering provider during after hours 7P-7A, please log into the web site [email or web address] and access using universal [place] password for that web site. If you do not have the password, please call the hospital operator.

## 2020-01-15 NOTE — Progress Notes (Signed)
Physical Therapy Treatment Patient Details Name: Kayla Thompson MRN: [id number] DOB: 01/10/1936 Today's Date: 01/15/2020    History of Present Illness Patient is 84 y.o. female with PMH significant for asthma, COPD, RA, anxiety, depression, DMII, GERD, HTN, CHF, ovarian cancer, hypotyroidism, chroinic UTis and PNA. Patient presented to [facility] on 10/23 with c/o weakness and confusion and was admitted for sepsis due to UTI.    PT Comments    Assisted OOB to [facility]. General bed mobility comments: very self limited use B UE's (bad shoulders) and great difficulty self scooting to EOB so used bed pad to complete.  General transfer comment: assisted from elevated bed and on/off BSC with difficulty self rising due to (Bad Knees) and limited self use b UE's (bad shoulders). General Gait Details: very limited amb distance and initial posterior LOB (Therapist corrected) with poor self correction.  HIGH FALL RISK.  Only amb from [facility] to recliner with difficulty. Avg RA was 96% no dyspnea Avg HR 112 No other c/o's. Pt plans to return home with 24/7 assist from personal Aides   Follow Up Recommendations  Home health PT;Supervision/Assistance - 24 hour (has a day Aide and a night Aide (24/7 Aides))     Equipment Recommendations  None recommended by PT    Recommendations for Other Services       Precautions / Restrictions Precautions Precautions: Fall Precaution Comments: Bad Left shoulder/Bad L knee    Mobility  Bed Mobility Overal bed mobility: Needs Assistance Bed Mobility: Supine to Sit     Supine to sit: Max assist     General bed mobility comments: very self limited use B UE's (bad shoulders) and great difficulty self scooting to EOB so used bed pad to complete  Transfers Overall transfer level: Needs assistance Equipment used: Rolling walker (2 wheeled) Transfers: Sit to/from [facility] Sit to Stand: Max assist Stand pivot transfers: Max assist       General  transfer comment: assisted from elevated bed and on/off BSC with difficulty self rising due to (Bad Knees) and limited self use b UE's (bad shoulders)  Ambulation/Gait Ambulation/Gait assistance: Mod assist Gait Distance (Feet): 4 Feet Assistive device: Rolling walker (2 wheeled) Gait Pattern/deviations: Step-to pattern;Decreased step length - left;Decreased stance time - right;Narrow base of support Gait velocity: decreased   General Gait Details: very limited amb distance and initial posterior LOB (Therapist corrected) with poor self correction.  HIGH FALL RISK.  Only amb from [facility] to recliner with difficulty.   Stairs             Wheelchair Mobility    Modified Rankin (Stroke Patients Only)       Balance                                            Cognition Arousal/Alertness: Awake/alert Behavior During Therapy: WFL for tasks assessed/performed Overall Cognitive Status: Within Functional Limits for tasks assessed                                 General Comments: AxO x 3 very pleasant (a little distracted)      Exercises      General Comments        Pertinent Vitals/Pain Pain Assessment: Faces Faces Pain Scale: Hurts little more Pain Location: B shoulders and L  knee Pain Descriptors / Indicators: Aching;Discomfort Pain Intervention(s): Monitored during session;Repositioned    Home Living                      Prior Function            PT Goals (current goals can now be found in the care plan section) Progress towards PT goals: Progressing toward goals    Frequency    Min 3X/week      PT Plan Current plan remains appropriate    Co-evaluation              AM-PAC PT "6 Clicks" Mobility   Outcome Measure  Help needed turning from your back to your side while in a flat bed without using bedrails?: A Lot Help needed moving from lying on your back to sitting on the side of a flat bed without using  bedrails?: A Lot Help needed moving to and from a bed to a chair (including a wheelchair)?: A Lot Help needed standing up from a chair using your arms (e.g., wheelchair or bedside chair)?: A Lot   Help needed climbing 3-5 steps with a railing? : Total 6 Click Score: 9    End of Session Equipment Utilized During Treatment: Gait belt Activity Tolerance: Patient tolerated treatment well Patient left: in chair;with call bell/phone within reach Nurse Communication: Mobility status (RA was 96%) PT Visit Diagnosis: Difficulty in walking, not elsewhere classified (R26.2);Muscle weakness (generalized) (M62.81)     Time: 3818-2993 PT Time Calculation (min) (ACUTE ONLY): 25 min  Charges:  $Gait Training: 8-22 mins $Therapeutic Activity: 8-22 mins                     Rica Koyanagi  PTA Acute  Rehabilitation Services Pager      [phone] Office      [phone]

## 2020-01-15 NOTE — Plan of Care (Signed)
  Problem: Education: Goal: Knowledge of General Education information will improve Description: Including pain rating scale, medication(s)/side effects and non-pharmacologic comfort measures Outcome: Completed/Met   Problem: Health Behavior/Discharge Planning: Goal: Ability to manage health-related needs will improve Outcome: Completed/Met   Problem: Clinical Measurements: Goal: Ability to maintain clinical measurements within normal limits will improve Outcome: Completed/Met Goal: Will remain free from infection Outcome: Completed/Met Goal: Diagnostic test results will improve Outcome: Completed/Met Goal: Respiratory complications will improve Outcome: Completed/Met Goal: Cardiovascular complication will be avoided Outcome: Completed/Met   Problem: Activity: Goal: Risk for activity intolerance will decrease Outcome: Completed/Met   Problem: Nutrition: Goal: Adequate nutrition will be maintained Outcome: Completed/Met   Problem: Coping: Goal: Level of anxiety will decrease Outcome: Completed/Met   Problem: Elimination: Goal: Will not experience complications related to bowel motility Outcome: Completed/Met Goal: Will not experience complications related to urinary retention Outcome: Completed/Met   Problem: Pain Managment: Goal: General experience of comfort will improve Outcome: Completed/Met   Problem: Safety: Goal: Ability to remain free from injury will improve Outcome: Completed/Met   Problem: Skin Integrity: Goal: Risk for impaired skin integrity will decrease Outcome: Completed/Met   Patient is being discharged to go home with home health.

## 2020-01-15 NOTE — Progress Notes (Signed)
PHARMACY CONSULT NOTE FOR:  OUTPATIENT  PARENTERAL ANTIBIOTIC THERAPY (OPAT)  Indication: ESBL Bacteremia  Regimen: Ertapenem 1,000 mg IV q24h  End date: 01/21/2020  IV antibiotic discharge orders are pended. To discharging provider:  please sign these orders via discharge navigator,  Select New Orders & click on the button choice - Manage This Unsigned Work.     Thank you for allowing pharmacy to be a part of this patient's care.  Gretta Arab PharmD, BCPS Clinical Pharmacist [facility] main pharmacy [phone] 01/15/2020 9:20 AM

## 2020-01-15 NOTE — TOC Transition Note (Addendum)
Transition of Care [facility]) - CM/SW Discharge Note   Patient Details  Name: Kayla Thompson MRN: [id number] Date of Birth: 05/15/1935  [facility] CM/SW Contact:  Dessa Phi, RN Phone Number: 01/15/2020, 10:16 AM   Clinical Narrative:Spoke to dtrs-Rhonda/[name] on phone-Rhonda c#[phone]-about d/c plans-Home w/HHC-used [facility] in past-rep drew following for HHRN-continued iv abx instruction/PT/OT;[facility]-rep Pam-initial iv abx instruction,med,supplies;Active w/[facility] for home 02 prn-family has travel tank. Informed of available caregiver willing to learn iv abx.Awaiting Picc line.No further CM needs.  3:55p-TC [facility] to confirm home 02 order-patient already ordered for home 02 2lnc continuous.Family has travel tank.If any new changes will fax order to [facility] [phone].     Final next level of care: [place] Barriers to Discharge: No Barriers Identified   Patient Goals and CMS Choice Patient states their goals for this hospitalization and ongoing recovery are:: go home CMS Medicare.gov Compare Post Acute Care list provided to:: Patient Choice offered to / list presented to : Patient  Discharge Placement                       Discharge Plan and Services   Discharge Planning Services: CM Consult                      HH Arranged: RN, OT, PT, IV Antibiotics   Date HH Agency Contacted: 01/15/20 Time HH Agency Contacted: 1013 Representative spoke with at [facility]: ;[facility] RN/PT/OT  Social Determinants of Health ([place]) Interventions     Readmission Risk Interventions No flowsheet data found.

## 2020-01-15 NOTE — Progress Notes (Signed)
Peripherally Inserted Central Catheter Placement  The IV Nurse has discussed with the patient and/or persons authorized to consent for the patient, the purpose of this procedure and the potential benefits and risks involved with this procedure.  The benefits include less needle sticks, lab draws from the catheter, and the patient may be discharged home with the catheter. Risks include, but not limited to, infection, bleeding, blood clot (thrombus formation), and puncture of an artery; nerve damage and irregular heartbeat and possibility to perform a PICC exchange if needed/ordered by physician.  Alternatives to this procedure were also discussed.  Bard Power PICC patient education guide, fact sheet on infection prevention and patient information card has been provided to patient /or left at bedside.    PICC Placement Documentation  PICC Single Lumen 62/86/38 PICC Right Basilic 35 cm 0 cm (Active)  Exposed Catheter (cm) 0 cm 01/15/20 1435  Line Status Blood return noted;Flushed;Saline locked 01/15/20 1435  Dressing Type Transparent;Securing device 01/15/20 1435  Dressing Status Clean;Dry;Intact 01/15/20 1435  Antimicrobial disc in place? Yes 01/15/20 1435  Safety Lock Not Applicable 17/71/16 5790  Dressing Change Due 01/22/20 01/15/20 1435       [name] 01/15/2020, 2:38 PM

## 2020-01-15 NOTE — Care Management Important Message (Signed)
Important Message  Patient Details IM Letter given to the Patient Name: Kayla Thompson MRN: [id number] Date of Birth: 23-Dec-1935   Medicare Important Message Given:  Yes     Kerin Salen 01/15/2020, 1:26 PM

## 2020-01-15 NOTE — Progress Notes (Signed)
Pt discharged to home with daughter, [name], with PICC line in place for antibiotic therapy.  Daughter was instructed on PICC line use by  Imagene Gurney with [employer].  All dc instructions and medications reviewed with [name] (pt's daughter) and daughter verbalized understanding. Jhony Antrim, Laurel Dimmer, RN

## 2020-01-15 NOTE — Progress Notes (Signed)
SATURATION QUALIFICATIONS: (This note is used to comply with regulatory documentation for home oxygen)  Patient Saturations on Room Air at Rest = 98%  Patient Saturations on Room Air while Ambulating = 96%  Glendi Mohiuddin, Laurel Dimmer, RN

## 2020-01-16 ENCOUNTER — Telehealth: Payer: Self-pay

## 2020-01-16 DIAGNOSIS — F32A Depression, unspecified: Secondary | ICD-10-CM | POA: Diagnosis not present

## 2020-01-16 DIAGNOSIS — Z5181 Encounter for therapeutic drug level monitoring: Secondary | ICD-10-CM | POA: Diagnosis not present

## 2020-01-16 DIAGNOSIS — Z87440 Personal history of urinary (tract) infections: Secondary | ICD-10-CM | POA: Diagnosis not present

## 2020-01-16 DIAGNOSIS — I110 Hypertensive heart disease with heart failure: Secondary | ICD-10-CM | POA: Diagnosis not present

## 2020-01-16 DIAGNOSIS — Z8701 Personal history of pneumonia (recurrent): Secondary | ICD-10-CM | POA: Diagnosis not present

## 2020-01-16 DIAGNOSIS — G8929 Other chronic pain: Secondary | ICD-10-CM | POA: Diagnosis not present

## 2020-01-16 DIAGNOSIS — N390 Urinary tract infection, site not specified: Secondary | ICD-10-CM | POA: Diagnosis not present

## 2020-01-16 DIAGNOSIS — E039 Hypothyroidism, unspecified: Secondary | ICD-10-CM | POA: Diagnosis not present

## 2020-01-16 DIAGNOSIS — I5033 Acute on chronic diastolic (congestive) heart failure: Secondary | ICD-10-CM | POA: Diagnosis not present

## 2020-01-16 DIAGNOSIS — E1165 Type 2 diabetes mellitus with hyperglycemia: Secondary | ICD-10-CM | POA: Diagnosis not present

## 2020-01-16 DIAGNOSIS — Z7952 Long term (current) use of systemic steroids: Secondary | ICD-10-CM | POA: Diagnosis not present

## 2020-01-16 DIAGNOSIS — G9341 Metabolic encephalopathy: Secondary | ICD-10-CM | POA: Diagnosis not present

## 2020-01-16 DIAGNOSIS — N179 Acute kidney failure, unspecified: Secondary | ICD-10-CM | POA: Diagnosis not present

## 2020-01-16 DIAGNOSIS — M797 Fibromyalgia: Secondary | ICD-10-CM | POA: Diagnosis not present

## 2020-01-16 DIAGNOSIS — J449 Chronic obstructive pulmonary disease, unspecified: Secondary | ICD-10-CM | POA: Diagnosis not present

## 2020-01-16 DIAGNOSIS — Z792 Long term (current) use of antibiotics: Secondary | ICD-10-CM | POA: Diagnosis not present

## 2020-01-16 DIAGNOSIS — M25511 Pain in right shoulder: Secondary | ICD-10-CM | POA: Diagnosis not present

## 2020-01-16 DIAGNOSIS — Z452 Encounter for adjustment and management of vascular access device: Secondary | ICD-10-CM | POA: Diagnosis not present

## 2020-01-16 DIAGNOSIS — Z1612 Extended spectrum beta lactamase (ESBL) resistance: Secondary | ICD-10-CM | POA: Diagnosis not present

## 2020-01-16 DIAGNOSIS — M810 Age-related osteoporosis without current pathological fracture: Secondary | ICD-10-CM | POA: Diagnosis not present

## 2020-01-16 DIAGNOSIS — M353 Polymyalgia rheumatica: Secondary | ICD-10-CM | POA: Diagnosis not present

## 2020-01-16 DIAGNOSIS — M1990 Unspecified osteoarthritis, unspecified site: Secondary | ICD-10-CM | POA: Diagnosis not present

## 2020-01-16 DIAGNOSIS — A4151 Sepsis due to Escherichia coli [E. coli]: Secondary | ICD-10-CM | POA: Diagnosis not present

## 2020-01-16 DIAGNOSIS — F419 Anxiety disorder, unspecified: Secondary | ICD-10-CM | POA: Diagnosis not present

## 2020-01-16 DIAGNOSIS — Z794 Long term (current) use of insulin: Secondary | ICD-10-CM | POA: Diagnosis not present

## 2020-01-16 NOTE — Telephone Encounter (Cosign Needed)
Transition Care Management Follow-up Telephone Call  Date of discharge and from where: 01/15/2020 from [facility]  How have you been since you were released from the hospital? Still very weak and unsteady gait  Any questions or concerns? No  Items Reviewed:  Did the pt receive and understand the discharge instructions provided? Yes   Medications obtained and verified? Yes   Other? No   Any new allergies since your discharge? No   Dietary orders reviewed? Yes  Do you have support at home? Yes   Home Care and Equipment/Supplies: Were home health services ordered? yes If so, what is the name of the agency? [facility]  Has the agency set up a time to come to the patient's home? yes Were any new equipment or medical supplies ordered?  Yes: picc line What is the name of the medical supply agency? n/a Were you able to get the supplies/equipment? yes Do you have any questions related to the use of the equipment or supplies? No  Functional Questionnaire: (I = Independent and D = Dependent) ADLs: d  Bathing/Dressing- d  Meal Prep- d  Eating- d  Maintaining continence- d  Transferring/Ambulation- d  Managing Meds- d  Follow up appointments reviewed:   PCP Hospital f/u appt confirmed? Yes  Scheduled to see Jodi Mourning, FNP on 01/25/2020 @ 3:00p.  [facility] f/u appt confirmed? Yes  Scheduled to see cardiology   Are transportation arrangements needed? No   If their condition worsens, is the pt aware to call PCP or go to the Emergency Dept.? Yes  Was the patient provided with contact information for the PCP's office or ED? Yes  Was to pt encouraged to call back with questions or concerns? Yes

## 2020-01-17 ENCOUNTER — Telehealth: Payer: Self-pay | Admitting: Internal Medicine

## 2020-01-17 ENCOUNTER — Telehealth (INDEPENDENT_AMBULATORY_CARE_PROVIDER_SITE_OTHER): Payer: Medicare Other | Admitting: Family Medicine

## 2020-01-17 ENCOUNTER — Telehealth: Payer: Self-pay

## 2020-01-17 ENCOUNTER — Ambulatory Visit: Payer: Medicare Other | Admitting: Podiatry

## 2020-01-17 DIAGNOSIS — A4151 Sepsis due to Escherichia coli [E. coli]: Secondary | ICD-10-CM | POA: Diagnosis not present

## 2020-01-17 DIAGNOSIS — R197 Diarrhea, unspecified: Secondary | ICD-10-CM

## 2020-01-17 DIAGNOSIS — N179 Acute kidney failure, unspecified: Secondary | ICD-10-CM | POA: Diagnosis not present

## 2020-01-17 DIAGNOSIS — N390 Urinary tract infection, site not specified: Secondary | ICD-10-CM | POA: Diagnosis not present

## 2020-01-17 DIAGNOSIS — Z452 Encounter for adjustment and management of vascular access device: Secondary | ICD-10-CM | POA: Diagnosis not present

## 2020-01-17 DIAGNOSIS — G9341 Metabolic encephalopathy: Secondary | ICD-10-CM | POA: Diagnosis not present

## 2020-01-17 DIAGNOSIS — Z1612 Extended spectrum beta lactamase (ESBL) resistance: Secondary | ICD-10-CM | POA: Diagnosis not present

## 2020-01-17 NOTE — Telephone Encounter (Signed)
Noted  

## 2020-01-17 NOTE — Telephone Encounter (Signed)
noted 

## 2020-01-17 NOTE — Progress Notes (Signed)
Virtual Visit via Telephone Note  I connected with Kayla Thompson on 01/17/20 at  4:40 PM EDT by telephone and verified that I am speaking with the correct person using two identifiers.   I discussed the limitations, risks, security and privacy concerns of performing an evaluation and management service by telephone and the availability of in person appointments. I also discussed with the patient that there may be a patient responsible charge related to this service. The patient expressed understanding and agreed to proceed.  Location patient: home, [place] Location provider: work or home office Participants present for the call: patient, provider, daughter Kayla Thompson Patient did not have a visit in the prior 7 days to address this/these issue(s).   History of Present Illness:  Acute telemedicine visit for : -Onset: yesterday -Symptoms include: had diarrhea x 2 yesterday, diarrhea x 1 today after drinking coffee -tried brat diet and oral fluids and is doing much better today -Denies: fevers, melena or hematochezia, vomiting, abd pain, inability to tol PO intake, malaise, swelling, SOB, sick contacts -Has tried: nothing -Pertinent past medical history: recently in hospital for sepisis 2ndary to UTI and is on IV abx; Cr 1.51 on discharge, lasix currently held -has hospital follow up visit with PCP next week per daughter -Pertinent medication allergies: penicillin -COVID-19 vaccine status: fully vaccinated, neg covid testing in hospital   Observations/Objective: Patient sounds cheerful and well on the phone. I do not appreciate any SOB. Speech and thought processing are grossly intact. Patient reported vitals:  Assessment and Plan:  Diarrhea, unspecified type  -we discussed possible serious and likely etiologies, options for evaluation and workup, limitations of telemedicine visit vs in person visit, treatment, treatment risks and precautions. Pt prefers to treat via telemedicine empirically  rather than in person at this moment.  Discussed the possibility of side effect to medications, gastroenteritis versus other causes with the patient and her daughter.  They feel that she has greatly improved since yesterday, with only one episode of diarrhea this morning after drinking coffee, which suggests possible viral gastroenteritis versus side effect to medication or diet.  Daughter notes that she has not had any signs of her fluid status increasing and reports she has no swelling in the legs, whereas she usually does have some swelling at baseline. She has opted to hold off on the Lasix for another day or 2 until the diarrhea has completely resolved.  We talked about the possibility of Covid and also that sometimes antibiotics can cause a severe colitis, C. difficile colitis.  She has opted to try Imodium if needed, bland diet without dairy or meat, oral hydration and close follow-up with her primary care office.  She has a hospital follow-up next week.  Recommended if any worsening of the diarrhea, more than 3 episodes of diarrhea in a given day, blood in the stools, abdominal pain or if the symptoms are not resolved over the next 1 to 2 days that she follow-up promptly with her primary care office or an urgent care and she may need stool testing.     Advised to seek prompt in person care if worsening, new symptoms arise, or if is not improving with treatment. Advised of options for inperson care in case PCP office not available. Did let the patient know that I only do telemedicine shifts for [facility] on Tuesdays and Thursdays and advised a follow up visit with PCP or at an [facility] if has further questions or concerns.   Follow Up Instructions:  I did not refer this patient for an OV in the next 24 hours for this/these issue(s).  I discussed the assessment and treatment plan with the patient. The patient was provided an opportunity to ask questions and all were answered. The patient agreed with the plan  and demonstrated an understanding of the instructions.   Spent around 30 minutes on this visit.  Lucretia Kern, DO

## 2020-01-17 NOTE — Telephone Encounter (Signed)
  Meedith a occupational therapist from [facility] calling to inform us that the patient had a [facility] occupational therapy evaluation today and patient doesn't want any additional visits at this time. Ailene [phone]

## 2020-01-17 NOTE — Patient Instructions (Signed)
Please eat a bland diet for the next week and avoid dairy and red meat.  Also avoid coffee and caffeinated products.  Drink plenty of fluids today and if having any further diarrhea.  Use the Imodium if needed for mild diarrhea, per instructions.  I hope you are feeling better soon!  Seek in person care promptly if your symptoms worsen, new concerns arise or you are not improving with treatment. Please follow-up with your primary care doctor or with the urgent care clinic if your diarrhea is worsening, you have more than 3 episodes of diarrhea in 1 day, there is any blood in the stool, abdominal pain or other symptoms or if your symptoms are not resolved over the next 1 to 2 days.  It was nice to meet you today. I help [facility] out with telemedicine visits on Tuesdays and Thursdays and am available for visits on those days. If you have any concerns or questions following this visit please schedule a follow up visit with your Primary Care doctor or seek care at a local urgent care clinic to avoid delays in care.

## 2020-01-17 NOTE — Telephone Encounter (Signed)
Mary at [facility] is calling to see if it is okay to pull PICC as indicated in discharge summary for  01-21-2020. There has been no correspondence in the chart that would indicate there was a change.  Verbal permission given to remove PICC as documented at hospital discharge note dated 01-15-2020.  Laverle Patter, RN

## 2020-01-17 NOTE — Telephone Encounter (Signed)
Home Health nurse called and reported the daughter is requesting a stool sample be done due to diarrhea.  Nurse advised patient to stay on clear diet. Informed nurse patient does a VV this afternoon. So stated daughter must of changed her mind and made the appointment.   If you do need anything for nurse call her back at: [phone] - Juliann Pulse

## 2020-01-17 NOTE — Telephone Encounter (Signed)
Spoke with daughter and she will wait until 11/5 appointment. Had not had anymore issues. Will call if patient has anymore problems.

## 2020-01-17 NOTE — Telephone Encounter (Signed)
[facility] Report/Call: ---Caller states her mom was discharged yesterday from sepsis and was put on IVPB and a picc line for IV antibiotics. She has had two explosive diarrhea episodes tonight. No fever.  Advised see PCP within 24 hours.  (Would you like this as an in OV or a virtual?)

## 2020-01-19 ENCOUNTER — Telehealth: Payer: Self-pay | Admitting: Family Medicine

## 2020-01-19 NOTE — Telephone Encounter (Signed)
Received phone call from on call nurse about patient's BPs running low 90/50s, normally 90/70s per family. Reviewing chart, normally BP runs 120/70s.  Recent admission for urosepsis due to ESBL E coli treated with meropenem via PICC planning through 01/21/2020.  Seen later this week for virtual visit for diarrhea. Daughter notes weakness and malaise compared to yesterday, unsure if staying well hydrated.  Diarrhea is resolving. No fevers.  Daughter is currently staying with her. HH also involved. Daughter planning to hold lasix.   Advised recheck BP - if persistently low, rec ER evaluation.  If improving (110/60-70s), recommend hold carvedilol over weekend, push fluids. If BP again drops, any confusion, fever, or worsening symptoms, to seek ER care.

## 2020-01-21 ENCOUNTER — Telehealth: Payer: Self-pay | Admitting: Internal Medicine

## 2020-01-21 DIAGNOSIS — A4151 Sepsis due to Escherichia coli [E. coli]: Secondary | ICD-10-CM | POA: Diagnosis not present

## 2020-01-21 DIAGNOSIS — Z792 Long term (current) use of antibiotics: Secondary | ICD-10-CM | POA: Diagnosis not present

## 2020-01-21 DIAGNOSIS — Z1612 Extended spectrum beta lactamase (ESBL) resistance: Secondary | ICD-10-CM | POA: Diagnosis not present

## 2020-01-21 DIAGNOSIS — G9341 Metabolic encephalopathy: Secondary | ICD-10-CM | POA: Diagnosis not present

## 2020-01-21 DIAGNOSIS — Z452 Encounter for adjustment and management of vascular access device: Secondary | ICD-10-CM | POA: Diagnosis not present

## 2020-01-21 DIAGNOSIS — N179 Acute kidney failure, unspecified: Secondary | ICD-10-CM | POA: Diagnosis not present

## 2020-01-21 DIAGNOSIS — N390 Urinary tract infection, site not specified: Secondary | ICD-10-CM | POA: Diagnosis not present

## 2020-01-21 NOTE — Telephone Encounter (Signed)
Called daughter- advised of last note from [facility] which was continue 15 units Lantus in the AM.  Patient daughter would like to make sure from [facility] that she did not change the RX since then. Advised she was off today and would return tomorrow.  Patient daughter verbalized understanding.

## 2020-01-21 NOTE — Telephone Encounter (Signed)
Daughter called to verify Lantus dosage.  Notes, Ny Chart, etc indicate 15 units per day, but patient is telling family that her dosage was changed to 11 units per day. Daughter is Gardiner Ramus [phone] is on the [facility].  Call daughter with confirmation of dosage

## 2020-01-22 ENCOUNTER — Telehealth: Payer: Self-pay | Admitting: Internal Medicine

## 2020-01-22 ENCOUNTER — Telehealth (HOSPITAL_COMMUNITY): Payer: Self-pay

## 2020-01-22 DIAGNOSIS — Z1612 Extended spectrum beta lactamase (ESBL) resistance: Secondary | ICD-10-CM | POA: Diagnosis not present

## 2020-01-22 DIAGNOSIS — Z5181 Encounter for therapeutic drug level monitoring: Secondary | ICD-10-CM | POA: Diagnosis not present

## 2020-01-22 DIAGNOSIS — Z794 Long term (current) use of insulin: Secondary | ICD-10-CM

## 2020-01-22 DIAGNOSIS — M1990 Unspecified osteoarthritis, unspecified site: Secondary | ICD-10-CM

## 2020-01-22 DIAGNOSIS — A4151 Sepsis due to Escherichia coli [E. coli]: Secondary | ICD-10-CM | POA: Diagnosis not present

## 2020-01-22 DIAGNOSIS — F32A Depression, unspecified: Secondary | ICD-10-CM | POA: Diagnosis not present

## 2020-01-22 DIAGNOSIS — I110 Hypertensive heart disease with heart failure: Secondary | ICD-10-CM | POA: Diagnosis not present

## 2020-01-22 DIAGNOSIS — J449 Chronic obstructive pulmonary disease, unspecified: Secondary | ICD-10-CM

## 2020-01-22 DIAGNOSIS — F419 Anxiety disorder, unspecified: Secondary | ICD-10-CM

## 2020-01-22 DIAGNOSIS — M353 Polymyalgia rheumatica: Secondary | ICD-10-CM

## 2020-01-22 DIAGNOSIS — E1165 Type 2 diabetes mellitus with hyperglycemia: Secondary | ICD-10-CM | POA: Diagnosis not present

## 2020-01-22 DIAGNOSIS — Z792 Long term (current) use of antibiotics: Secondary | ICD-10-CM | POA: Diagnosis not present

## 2020-01-22 DIAGNOSIS — G9341 Metabolic encephalopathy: Secondary | ICD-10-CM | POA: Diagnosis not present

## 2020-01-22 DIAGNOSIS — I5033 Acute on chronic diastolic (congestive) heart failure: Secondary | ICD-10-CM | POA: Diagnosis not present

## 2020-01-22 DIAGNOSIS — M25511 Pain in right shoulder: Secondary | ICD-10-CM

## 2020-01-22 DIAGNOSIS — N179 Acute kidney failure, unspecified: Secondary | ICD-10-CM | POA: Diagnosis not present

## 2020-01-22 DIAGNOSIS — M810 Age-related osteoporosis without current pathological fracture: Secondary | ICD-10-CM

## 2020-01-22 DIAGNOSIS — N390 Urinary tract infection, site not specified: Secondary | ICD-10-CM | POA: Diagnosis not present

## 2020-01-22 DIAGNOSIS — G8929 Other chronic pain: Secondary | ICD-10-CM

## 2020-01-22 DIAGNOSIS — Z87440 Personal history of urinary (tract) infections: Secondary | ICD-10-CM

## 2020-01-22 DIAGNOSIS — M797 Fibromyalgia: Secondary | ICD-10-CM

## 2020-01-22 DIAGNOSIS — Z8701 Personal history of pneumonia (recurrent): Secondary | ICD-10-CM

## 2020-01-22 DIAGNOSIS — E039 Hypothyroidism, unspecified: Secondary | ICD-10-CM

## 2020-01-22 DIAGNOSIS — Z452 Encounter for adjustment and management of vascular access device: Secondary | ICD-10-CM | POA: Diagnosis not present

## 2020-01-22 DIAGNOSIS — Z7952 Long term (current) use of systemic steroids: Secondary | ICD-10-CM

## 2020-01-22 NOTE — Telephone Encounter (Signed)
Called, LVM for daughter to call back- gave response from MD, but to call back with questions.

## 2020-01-22 NOTE — Telephone Encounter (Signed)
Spoke with patient's daughter. She was asking if patient should restart lasix or keep holding it.  It has been on hold due to kidney function since before she was discharged from the hospital.  Has taken none at home since being home last week.  Has very little swelling.  No SOB reported.   The patient had some low BPs and loose stools last Thurs (90s/50s)-increased fluid intake some and at advice of PCP stopped carvedilol.  Sun-today BPs 120s/70s.  No further loose stools.   I adv to continue to hold lasix until seen in cardiology (11/16).  She has called PCP to ask about restarting carvedilol.    Aware I am forwarding to her cardiologist and will call her back if there are any further recommendations.

## 2020-01-22 NOTE — Telephone Encounter (Signed)
That sounds reasonable: at this time in the setting of loose stools her blood pressure was low and she needed the volume.  We can see her at her appointment and figure out next steps.

## 2020-01-22 NOTE — Telephone Encounter (Signed)
New message:     Patient calling to ask some question concering mother medication.

## 2020-01-22 NOTE — Telephone Encounter (Signed)
I am not sure where the 11 units are coming from... cannot find this in the chart. If sugars are controlled on 11 units, ok to continue. C

## 2020-01-22 NOTE — Telephone Encounter (Signed)
LVM for Kayla Thompson to rtn my call regarding [facility] on 02/01/20.    Thanks, [phone]

## 2020-01-23 ENCOUNTER — Encounter: Payer: Self-pay | Admitting: Internal Medicine

## 2020-01-23 ENCOUNTER — Telehealth (INDEPENDENT_AMBULATORY_CARE_PROVIDER_SITE_OTHER): Payer: Medicare Other | Admitting: Adult Health

## 2020-01-23 ENCOUNTER — Encounter: Payer: Self-pay | Admitting: Adult Health

## 2020-01-23 DIAGNOSIS — Z452 Encounter for adjustment and management of vascular access device: Secondary | ICD-10-CM | POA: Diagnosis not present

## 2020-01-23 DIAGNOSIS — A4151 Sepsis due to Escherichia coli [E. coli]: Secondary | ICD-10-CM | POA: Diagnosis not present

## 2020-01-23 DIAGNOSIS — N179 Acute kidney failure, unspecified: Secondary | ICD-10-CM | POA: Diagnosis not present

## 2020-01-23 DIAGNOSIS — N390 Urinary tract infection, site not specified: Secondary | ICD-10-CM | POA: Diagnosis not present

## 2020-01-23 DIAGNOSIS — Z1612 Extended spectrum beta lactamase (ESBL) resistance: Secondary | ICD-10-CM | POA: Diagnosis not present

## 2020-01-23 DIAGNOSIS — R059 Cough, unspecified: Secondary | ICD-10-CM | POA: Diagnosis not present

## 2020-01-23 DIAGNOSIS — G9341 Metabolic encephalopathy: Secondary | ICD-10-CM | POA: Diagnosis not present

## 2020-01-23 NOTE — Progress Notes (Signed)
Virtual Visit via Telephone Note  I connected with Kayla Thompson on 01/23/20 at  4:00 PM EDT by telephone and verified that I am speaking with the correct person using two identifiers.   I discussed the limitations, risks, security and privacy concerns of performing an evaluation and management service by telephone and the availability of in person appointments. I also discussed with the patient that there may be a patient responsible charge related to this service. The patient expressed understanding and agreed to proceed.  Location patient: home Location provider: work or home office Participants present for the call: patient, provider, daughter, caregiver  Patient did not have a visit in the prior 7 days to address this/these issue(s).   History of Present Illness: 84 year old female who  has a past medical history of Anxiety, Arthritis, Asthma, Cataracts, bilateral, COPD (chronic obstructive pulmonary disease) ([place]), Depression, Diabetes mellitus without complication ([place]), Dyspnea, Fibromyalgia, GERD (gastroesophageal reflux disease), History of chemotherapy, History of fractured pelvis, Hypertension, Hypothyroidism, Osteoporosis (12/2017), Osteoporosis, Ovarian cancer ([place]), and Pneumonia.  Recent admission for urosepsis due to ESBL E. coli and was treated with meropenem via PICC that was DC'd 2 days ago on 01/21/2020.  She has a significant history of COPD and CHF.  Currently prescribed prednisone 2 mg daily and Trelegy inhaler as well as albuterol rescue inhaler and nebulizer.  She reports that since being discharged from the hospital she has had a "wet cough".  She denies any worsening shortness of breath or wheezing past her baseline.  Has not experienced any fevers or chills.  Her blood pressure has been on the low side of 90s over 60s and she has been without her Lasix and Coreg.  Caregiver who is at the home today reports some mild lower extremity edema.  Cardiology wanted to hold off on  her Lasix and Coreg until her blood pressure came back up.  Currently her blood pressure is 120s over 70s.  She has been using Mucinex to help with her cough and feels as though this is helpful.  Does have a virtual visit coming up    Observations/Objective: Patient sounds cheerful and well on the phone. I do not appreciate any SOB. Speech and thought processing are grossly intact. Patient reported vitals:  Assessment and Plan: 1. Cough -Possibly from fluid overload being that she has been on Lasix.  Since blood pressure just returned to her normal limits, we will have her hold off on taking it just now, especially since she is stable.  Family asked me to reach out to the nurse practitioner that she will be seen in 2 days for hospital follow-up to see if this can be changed from virtual to an office.  I will do this, I do not think her cough is anything more than possible fluid overload or could be from COPD.  She is on daily prednisone therapy and the antibiotics were just discontinued we will hold off on treatment at this time.  She and her family members were advised to follow-up sooner if symptoms worsen  Follow Up Instructions:  I did not refer this patient for an OV in the next 24 hours for this/these issue(s).  I discussed the assessment and treatment plan with the patient. The patient was provided an opportunity to ask questions and all were answered. The patient agreed with the plan and demonstrated an understanding of the instructions.   The patient was advised to call back or seek an in-person evaluation if the  symptoms worsen or if the condition fails to improve as anticipated.  I provided 14 minutes of non-face-to-face time during this encounter.   Dorothyann Peng, NP

## 2020-01-23 NOTE — Telephone Encounter (Signed)
error 

## 2020-01-24 ENCOUNTER — Telehealth: Payer: Self-pay | Admitting: Internal Medicine

## 2020-01-24 ENCOUNTER — Ambulatory Visit: Payer: Medicare Other | Admitting: Obstetrics and Gynecology

## 2020-01-24 ENCOUNTER — Other Ambulatory Visit: Payer: Medicare Other

## 2020-01-24 ENCOUNTER — Other Ambulatory Visit: Payer: Self-pay | Admitting: Internal Medicine

## 2020-01-24 ENCOUNTER — Telehealth: Payer: Self-pay | Admitting: Emergency Medicine

## 2020-01-24 ENCOUNTER — Telehealth: Payer: Self-pay | Admitting: Family

## 2020-01-24 NOTE — Telephone Encounter (Signed)
OK. Thx

## 2020-01-24 NOTE — Telephone Encounter (Signed)
Left message for patient today with recommendations.

## 2020-01-24 NOTE — Telephone Encounter (Signed)
Sent to Dr. Quay Burow

## 2020-01-24 NOTE — Telephone Encounter (Signed)
Sent to Dr. Plotnikov. 

## 2020-01-24 NOTE — Telephone Encounter (Signed)
Since she has an appt tomorrow can be evaluated and put on treatment then.

## 2020-01-24 NOTE — Telephone Encounter (Addendum)
   Liji from [facility] calling for verbal orders for home health physical therapy, frequency is 1 week 1, 2 week 6, and 1 week 2 to work on Psychologist, sport and exercise and strengthening. Liji #  [phone] Okay to LVM

## 2020-01-24 NOTE — Telephone Encounter (Signed)
The family asked about her having an in office visit tomorrow as opposed to virtual. In person is fine; they chose the virtual option so I assumed that was easier for them.  Please call and change visit type;

## 2020-01-24 NOTE — Telephone Encounter (Signed)
Pts daughter called [facility] yesterday 01/23/2020 at 5:37pm. Caller stated mother may have a vaginal yeast infection & other areas with possible yeast like symptoms (Skin folds); first noted last Wednesday. Denies any other symptoms. Has been treating with A&D and desitin ointments, with not much relief. Recently discharged from hospital last week due to sepsis. Recommended being seen by PCP within 24 hrs. Called patient to schedule appt. No answer but LVM to call back and schedule appt.

## 2020-01-25 ENCOUNTER — Ambulatory Visit (INDEPENDENT_AMBULATORY_CARE_PROVIDER_SITE_OTHER): Payer: Medicare Other

## 2020-01-25 ENCOUNTER — Inpatient Hospital Stay: Payer: Medicare Other | Admitting: Family

## 2020-01-25 ENCOUNTER — Other Ambulatory Visit: Payer: Self-pay

## 2020-01-25 ENCOUNTER — Telehealth: Payer: Self-pay

## 2020-01-25 ENCOUNTER — Encounter: Payer: Self-pay | Admitting: Family

## 2020-01-25 ENCOUNTER — Ambulatory Visit (INDEPENDENT_AMBULATORY_CARE_PROVIDER_SITE_OTHER): Payer: Medicare Other | Admitting: Family

## 2020-01-25 ENCOUNTER — Telehealth: Payer: Self-pay | Admitting: *Deleted

## 2020-01-25 VITALS — BP 100/64 | Temp 98.0°F | Ht 59.0 in

## 2020-01-25 DIAGNOSIS — R053 Chronic cough: Secondary | ICD-10-CM | POA: Diagnosis not present

## 2020-01-25 DIAGNOSIS — R21 Rash and other nonspecific skin eruption: Secondary | ICD-10-CM

## 2020-01-25 DIAGNOSIS — N390 Urinary tract infection, site not specified: Secondary | ICD-10-CM | POA: Diagnosis not present

## 2020-01-25 DIAGNOSIS — N179 Acute kidney failure, unspecified: Secondary | ICD-10-CM | POA: Diagnosis not present

## 2020-01-25 DIAGNOSIS — A419 Sepsis, unspecified organism: Secondary | ICD-10-CM

## 2020-01-25 DIAGNOSIS — B370 Candidal stomatitis: Secondary | ICD-10-CM

## 2020-01-25 DIAGNOSIS — I5032 Chronic diastolic (congestive) heart failure: Secondary | ICD-10-CM

## 2020-01-25 DIAGNOSIS — R059 Cough, unspecified: Secondary | ICD-10-CM | POA: Diagnosis not present

## 2020-01-25 LAB — CBC WITH DIFFERENTIAL/PLATELET
Basophils Absolute: 0.1 10*3/uL (ref 0.0–0.1)
Basophils Relative: 0.6 % (ref 0.0–3.0)
Eosinophils Absolute: 0.2 10*3/uL (ref 0.0–0.7)
Eosinophils Relative: 1.8 % (ref 0.0–5.0)
HCT: 32 % — ABNORMAL LOW (ref 36.0–46.0)
Hemoglobin: 10.3 g/dL — ABNORMAL LOW (ref 12.0–15.0)
Lymphocytes Relative: 42 % (ref 12.0–46.0)
Lymphs Abs: 5.3 10*3/uL — ABNORMAL HIGH (ref 0.7–4.0)
MCHC: 32.2 g/dL (ref 30.0–36.0)
MCV: 106.3 fl — ABNORMAL HIGH (ref 78.0–100.0)
Monocytes Absolute: 0.4 10*3/uL (ref 0.1–1.0)
Monocytes Relative: 3.6 % (ref 3.0–12.0)
Neutro Abs: 6.5 10*3/uL (ref 1.4–7.7)
Neutrophils Relative %: 52 % (ref 43.0–77.0)
Platelets: 490 10*3/uL — ABNORMAL HIGH (ref 150.0–400.0)
RBC: 3.02 Mil/uL — ABNORMAL LOW (ref 3.87–5.11)
RDW: 14.2 % (ref 11.5–15.5)
WBC: 12.5 10*3/uL — ABNORMAL HIGH (ref 4.0–10.5)

## 2020-01-25 LAB — COMPREHENSIVE METABOLIC PANEL
ALT: 23 U/L (ref 0–35)
AST: 14 U/L (ref 0–37)
Albumin: 3.5 g/dL (ref 3.5–5.2)
Alkaline Phosphatase: 33 U/L — ABNORMAL LOW (ref 39–117)
BUN: 14 mg/dL (ref 6–23)
CO2: 27 mEq/L (ref 19–32)
Calcium: 9.1 mg/dL (ref 8.4–10.5)
Chloride: 100 mEq/L (ref 96–112)
Creatinine, Ser: 1.04 mg/dL (ref 0.40–1.20)
GFR: 50.02 mL/min — ABNORMAL LOW (ref >60.00)
Glucose, Bld: 51 mg/dL — ABNORMAL LOW (ref 70–99)
Potassium: 4 mEq/L (ref 3.5–5.1)
Sodium: 137 mEq/L (ref 135–145)
Total Bilirubin: 0.4 mg/dL (ref 0.2–1.2)
Total Protein: 6.1 g/dL (ref 6.0–8.3)

## 2020-01-25 LAB — BRAIN NATRIURETIC PEPTIDE: Pro B Natriuretic peptide (BNP): 103 pg/mL — ABNORMAL HIGH (ref 0.0–100.0)

## 2020-01-25 MED ORDER — FLUCONAZOLE 100 MG PO TABS: 100.0000 mg | ORAL_TABLET | Freq: Every day | ORAL | 0 refills | Status: DC

## 2020-01-25 NOTE — Telephone Encounter (Signed)
Front desk called patient to set her up for hospital follow up, spoke with her daughter [name] who asked to speak with a nurse.  Patient has been more confused with this most recent bout of sepsis.  [name] would like Dr Storm Frisk input on the most recent labs drawn 11/1 before the PICC was discontinued.   Patient is scheduled for follow up with Dr Baxter Flattery 11/10 10:45 Please advise. Landis Gandy, RN

## 2020-01-25 NOTE — Progress Notes (Signed)
Kayla Thompson is a 84 y.o. female with the following history as recorded in EpicCare:  Patient Active Problem List   Diagnosis Date Noted  . Sepsis ([place]) 01/12/2020  . Lower extremity edema 11/20/2019  . Pre-operative cardiovascular examination 10/18/2019  . UTI symptoms 05/29/2019  . Dysuria 05/11/2019  . Upper esophageal web 03/27/2019  . Iron deficiency anemia 03/27/2019  . Candida esophagitis ([place]) 02/14/2019  . Encounter for general adult medical examination with abnormal findings 01/09/2019  . Abnormal barium swallow 12/28/2018  . Throat clearing 12/28/2018  . History of colonic polyps 12/28/2018  . Dysphagia 12/28/2018  . Diabetic foot ulcer ([place]) 12/05/2018  . Rotator cuff arthropathy, left 11/16/2018  . Degenerative arthritis of left knee 11/16/2018  . Insomnia 09/26/2018  . Leukocytosis 10/17/2017  . Malnutrition of moderate degree 10/10/2017  . Memory changes 07/29/2017  . Granulomatous lung disease ([place]) 06/14/2017  . Tracheobronchomalacia 06/14/2017  . Dizziness 03/04/2017  . Exercise hypoxemia 07/12/2016  . Diastolic CHF, chronic ([place]) 12/25/2015  . GERD (gastroesophageal reflux disease) 12/25/2015  . Epidermal inclusion cyst 09/02/2015  . Type 2 diabetes mellitus with hyperglycemia, with long-term current use of insulin ([place]) 03/10/2015  . Muscle cramps 03/06/2015  . Cough 01/07/2015  . Bronchiectasis without acute exacerbation ([place]) 01/07/2015  . PMR (polymyalgia rheumatica) ([place]) 01/07/2015  . Osteoarthritis 01/07/2015  . Asthma, chronic 11/12/2014  . Anxiety state 11/12/2014  . Hyperparathyroidism ([place]) 10/28/2014  . Fibromyalgia 09/21/2014  . Personal history of ovarian cancer 08/23/2014  . Osteoporosis 08/23/2014  . Diabetes mellitus with coincident hypertension ([place]) 08/02/2014  . HLD (hyperlipidemia) 08/02/2014  . Hypothyroidism 08/02/2014    Current Outpatient Medications  Medication Sig Dispense Refill  . albuterol (PROAIR HFA) 108 (90 Base)  MCG/ACT inhaler Inhale 2 puffs into the lungs every 6 (six) hours as needed for wheezing or shortness of breath. 18 g 12  . amitriptyline (ELAVIL) 10 MG tablet Take 1 tablet (10 mg total) by mouth at bedtime. 90 tablet 3  . baclofen (LIORESAL) 10 MG tablet TAKE 0.5-1 TABLET BY MOUTH TWICE DAILY AS NEEDED FOR MUSCLE SPASMS OR PAIN (Patient taking differently: Take 5-10 mg by mouth 2 (two) times daily. TAKE 0.5-1 TABLET BY MOUTH TWICE DAILY AS NEEDED FOR MUSCLE SPASMS OR PAIN) 180 tablet 1  . BD PEN NEEDLE NANO 2ND GEN 32G X 4 MM MISC USE AS DIRECTED 4 TIMES A DAY 100 each 5  . busPIRone (BUSPAR) 5 MG tablet Take 1-2 tablets (5-10 mg total) by mouth daily as needed. Anxiety    . calcium carbonate (OSCAL) 1500 (600 Ca) MG TABS tablet Take 600 mg by mouth 2 (two) times daily with a meal.    . Cholecalciferol (VITAMIN D3) 2000 units TABS Take 2 tablets by mouth daily.     Marland Kitchen Dextromethorphan-Guaifenesin (MUCINEX DM MAXIMUM STRENGTH) 60-1200 MG TB12 Take 1 tablet by mouth 2 (two) times daily. Reported on 06/23/2015    . DULoxetine (CYMBALTA) 30 MG capsule Take 1 capsule (30 mg total) by mouth daily. Plan to see her PCP for further refills 90 capsule 1  . DULoxetine (CYMBALTA) 60 MG capsule Take 1 capsule (60 mg total) by mouth daily. 90 capsule 0  . feeding supplement (ENSURE ENLIVE / ENSURE PLUS) LIQD Take 237 mLs by mouth 2 (two) times daily between meals. 237 mL 12  . ferrous gluconate (FERGON) 324 MG tablet TAKE 1 TABLET BY MOUTH EVERY DAY WITH BREAKFAST (Patient taking differently: Take 324 mg by mouth daily with breakfast. )  90 tablet 2  . Fluticasone-Umeclidin-Vilant (TRELEGY ELLIPTA) 100-62.5-25 MCG/INH AEPB Inhale 1 puff into the lungs daily. Rinse mouth 60 each 11  . glucose blood (FREESTYLE TEST STRIPS) test strip Use to test blood sugar 3 times daily. Dx: E11.65 100 each 3  . HYDROcodone-acetaminophen (NORCO) 10-325 MG tablet Take 1 tablet by mouth every 4 (four) hours as needed.    . insulin  glargine (LANTUS SOLOSTAR) 100 UNIT/ML Solostar Pen Inject 15 Units into the skin daily.    Marland Kitchen ipratropium-albuterol (DUONEB) 0.5-2.5 (3) MG/3ML SOLN Inhale 3 mLs into the lungs 3 (three) times daily as needed (shortness of breath). USE 1 VIAL IN NEBULIZER 3 TIMES DAILY    . Lancets (FREESTYLE) lancets Use to test blood sugar 3 times daily. Dx: E11.65 100 each 3  . levothyroxine (SYNTHROID) 50 MCG tablet TAKE 1 TABLET BY MOUTH EVERY DAY BEFORE BREAKFAST (Patient taking differently: Take 50 mcg by mouth daily before breakfast. TAKE 1 TABLET BY MOUTH EVERY DAY BEFORE BREAKFAST) 90 tablet 1  . lidocaine (LIDODERM) 5 % Place 1 patch onto the skin every 12 (twelve) hours as needed. pain    . lidocaine (XYLOCAINE) 5 % ointment Apply 1 application topically as needed. Apply to external vulvar area 50 g 1  . magnesium oxide (MAG-OX) 400 MG tablet TAKE 1 TABLET BY MOUTH EVERY DAY 90 tablet 0  . methenamine (MANDELAMINE) 1 g tablet Take 1,000 mg by mouth 2 (two) times daily. Cont.    . Misc Natural Products (TART CHERRY ADVANCED PO) Take by mouth. Once daily    . montelukast (SINGULAIR) 10 MG tablet TAKE 1 TABLET BY MOUTH EVERY DAY (Patient taking differently: Take 10 mg by mouth at bedtime. ) 90 tablet 2  . Multiple Vitamins-Minerals (CENTRUM ADULTS PO) Take 1 tablet by mouth daily.     Marland Kitchen omeprazole (PRILOSEC) 20 MG capsule TAKE 1 CAPSULE BY MOUTH 2 TIMES A DAY BEFORE A MEAL (Patient taking differently: Take 20 mg by mouth 2 (two) times daily before a meal. ) 180 capsule 1  . predniSONE (DELTASONE) 1 MG tablet Take 2 tablets (2 mg total) by mouth daily. 60 tablet 1  . rosuvastatin (CRESTOR) 10 MG tablet Take 1 tablet (10 mg total) by mouth every evening. 90 tablet 1  . UNABLE TO FIND Take 1,000 mg by mouth daily. Med Name: Tumeric    . vitamin B-12 (CYANOCOBALAMIN) 250 MCG tablet Take 250 mcg by mouth See admin instructions. Twice weekly  ( Monday & Thursday )    . carvedilol (COREG) 3.125 MG tablet Take 1  tablet (3.125 mg total) by mouth 2 (two) times daily with a meal. (Patient not taking: Reported on 01/25/2020) 180 tablet 1  . fluconazole (DIFLUCAN) 100 MG tablet Take 1 tablet (100 mg total) by mouth daily. 10 tablet 0  . furosemide (LASIX) 20 MG tablet Take 2 tablets (40 mg total) by mouth daily. (Patient not taking: Reported on 01/25/2020)     No current facility-administered medications for this visit.    Allergies: Latex and Penicillins  Past Medical History:  Diagnosis Date  . Anxiety   . Arthritis   . Asthma   . Cataracts, bilateral    removed by surgery  . COPD (chronic obstructive pulmonary disease) (HCC)   . Depression   . Diabetes mellitus without complication (HCC)    type 2  . Dyspnea    with exertion  . Fibromyalgia   . GERD (gastroesophageal reflux disease)   . History  of chemotherapy   . History of fractured pelvis   . Hypertension   . Hypothyroidism   . Osteoporosis 12/2017   T score -2.8 stable from prior DEXA  . Osteoporosis   . Ovarian cancer ([place])   . Pneumonia    several times    Past Surgical History:  Procedure Laterality Date  . ABDOMINAL HYSTERECTOMY  1996  . ANKLE FRACTURE SURGERY     plate and screws  . BIOPSY  02/05/2019   Procedure: BIOPSY;  Surgeon: Rush Landmark Telford Nab., MD;  Location: [facility];  Service: Gastroenterology;;  . BLADDER SUSPENSION    . CATARACT EXTRACTION    . COLONOSCOPY    . ESOPHAGEAL BRUSHING  02/05/2019   Procedure: ESOPHAGEAL BRUSHING;  Surgeon: Rush Landmark Telford Nab., MD;  Location: [facility];  Service: Gastroenterology;;  . ESOPHAGOGASTRODUODENOSCOPY (EGD) WITH PROPOFOL N/A 02/05/2019   Procedure: ESOPHAGOGASTRODUODENOSCOPY (EGD) WITH PROPOFOL with dialtion;  Surgeon: Irving Copas., MD;  Location: [facility];  Service: Gastroenterology;  Laterality: N/A;  . EYE SURGERY     cataracts removed-bilateral  . hip surgey    . knee surgey    . LUNG SURGERY    . OOPHORECTOMY     BSO  . SAVORY  DILATION N/A 02/05/2019   Procedure: SAVORY DILATION;  Surgeon: Rush Landmark Telford Nab., MD;  Location: [facility];  Service: Gastroenterology;  Laterality: N/A;  . TONSILLECTOMY    . UPPER GI ENDOSCOPY      Family History  Problem Relation Age of Onset  . Lung cancer Mother   . CAD Father   . Diabetes Father   . Lung cancer Maternal Aunt   . Diabetes Paternal Aunt   . Diabetes Paternal Uncle   . Diabetes Paternal Grandmother   . Diabetes Paternal Grandfather   . Colon cancer Neg Hx   . Esophageal cancer Neg Hx   . Inflammatory bowel disease Neg Hx   . Liver disease Neg Hx   . Pancreatic cancer Neg Hx   . Rectal cancer Neg Hx   . Stomach cancer Neg Hx     Social History   Tobacco Use  . Smoking status: Never Smoker  . Smokeless tobacco: Never Used  Substance Use Topics  . Alcohol use: No    Alcohol/week: 0.0 standard drinks    Subjective:  Admited to hospital from 10/23-10/26 with sepsis from complicated UTI; accompanied by granddaughter who helps provide history and daughter is on the phone for part of the visit;  Notes that in general has been feeling much better; is not taking either Lasix or Carvedilol due to blood pressure being so low; scheduled to see cardiology on 11/16; Has appointment pending with ID next week- Dr. Baxter Flattery; Has not contacted urology yet- family member will get scheduled; Concerned about possible thrush and persistent yeast infection of the skin;  Also requesting updated CXR due to "wet cough" x 1 week; family in agreement that cough has been improving however;   Objective:  Vitals:   01/25/20 1052  BP: 100/64  Temp: 98 F (36.7 C)  TempSrc: Oral  Height: $Remove'4\' 11"'trwGaAp$  (1.499 m)    General: Well developed, well nourished, in no acute distress  Skin : Warm and dry.  Head: Normocephalic and atraumatic  Eyes: Sclera and conjunctiva clear; pupils round and reactive to light; extraocular movements intact  Ears: External normal; canals clear;  tympanic membranes normal  Oropharynx: Pink, supple. Thrush noted in posterior pharynx Neck: Supple without thyromegaly, adenopathy  Lungs: Respirations  unlabored; clear to auscultation bilaterally without wheeze, rales, rhonchi  CVS exam: normal rate and regular rhythm.  Extremities: No edema, cyanosis, clubbing  Vessels: Symmetric bilaterally  Neurologic: Alert and oriented; speech intact; face symmetrical; in wheelchair;  Assessment:  1. Sepsis, due to unspecified organism, unspecified whether acute organ dysfunction present ([place])   2. Chronic UTI   3. Chronic cough   4. AKI (acute kidney injury) ([place])   5. Diastolic CHF, chronic (HCC)   6. Thrush   7. Rash and other nonspecific skin eruption     Plan:  1. Well appearing in office today; keep planned follow-up with ID next week; check CBC today; 2. She denies concerns today; stressed need to see urology and family agrees; 3. ? COPD; update CXR today; 4. Check CMP today;  5. Check BNP per family request; hold Coreg and Lasix until she sees cardiology; 6. & 7. Rx for Diflucan 100 mg qd x 10 days; can be extended if needed;   Will cancel AWV for next week and they will call to re-schedule in 1-2 months as she stabilizes from hospitalization.   Time spent 35 minutes;  This visit occurred during the SARS-CoV-2 public health emergency.  Safety protocols were in place, including screening questions prior to the visit, additional usage of staff PPE, and extensive cleaning of exam room while observing appropriate contact time as indicated for disinfecting solutions.     No follow-ups on file.  Orders Placed This Encounter  Procedures  . DG Chest 2 View    Standing Status:   Future    Number of Occurrences:   1    Standing Expiration Date:   01/24/2021    Order Specific Question:   Reason for Exam (SYMPTOM  OR DIAGNOSIS REQUIRED)    Answer:   chronic cough/ COPD    Order Specific Question:   Preferred imaging location?    Answer:    [facility]  . CBC with Differential/Platelet    Standing Status:   Future    Number of Occurrences:   1    Standing Expiration Date:   01/24/2021  . Comp Met (CMET)    Standing Status:   Future    Number of Occurrences:   1    Standing Expiration Date:   01/24/2021  . B Nat Peptide    Standing Status:   Future    Number of Occurrences:   1    Standing Expiration Date:   01/24/2021    Requested Prescriptions   Signed Prescriptions Disp Refills  . fluconazole (DIFLUCAN) 100 MG tablet 10 tablet 0    Sig: Take 1 tablet (100 mg total) by mouth daily.

## 2020-01-25 NOTE — Telephone Encounter (Signed)
Called to get patient an appointment scheduled, daughter will call back if appointment is needed

## 2020-01-28 ENCOUNTER — Other Ambulatory Visit: Payer: Self-pay | Admitting: Internal Medicine

## 2020-01-28 ENCOUNTER — Ambulatory Visit: Payer: Medicare Other | Admitting: Podiatry

## 2020-01-29 DIAGNOSIS — N3000 Acute cystitis without hematuria: Secondary | ICD-10-CM | POA: Diagnosis not present

## 2020-01-30 ENCOUNTER — Ambulatory Visit (INDEPENDENT_AMBULATORY_CARE_PROVIDER_SITE_OTHER): Payer: Medicare Other | Admitting: Internal Medicine

## 2020-01-30 ENCOUNTER — Other Ambulatory Visit: Payer: Self-pay

## 2020-01-30 ENCOUNTER — Telehealth: Payer: Self-pay | Admitting: Internal Medicine

## 2020-01-30 DIAGNOSIS — G9341 Metabolic encephalopathy: Secondary | ICD-10-CM | POA: Diagnosis not present

## 2020-01-30 DIAGNOSIS — Z87440 Personal history of urinary (tract) infections: Secondary | ICD-10-CM | POA: Diagnosis not present

## 2020-01-30 DIAGNOSIS — A4151 Sepsis due to Escherichia coli [E. coli]: Secondary | ICD-10-CM | POA: Diagnosis not present

## 2020-01-30 DIAGNOSIS — N390 Urinary tract infection, site not specified: Secondary | ICD-10-CM | POA: Diagnosis not present

## 2020-01-30 DIAGNOSIS — N179 Acute kidney failure, unspecified: Secondary | ICD-10-CM | POA: Diagnosis not present

## 2020-01-30 DIAGNOSIS — Z1612 Extended spectrum beta lactamase (ESBL) resistance: Secondary | ICD-10-CM | POA: Diagnosis not present

## 2020-01-30 DIAGNOSIS — Z452 Encounter for adjustment and management of vascular access device: Secondary | ICD-10-CM | POA: Diagnosis not present

## 2020-01-30 NOTE — Patient Instructions (Signed)
If you have urinary symptoms, please let your pcp know that you had history of ESBL e.coli (drug resistant e.coli) in October  Recommend to use fosfomycin 3gm to start if being treated for UTI

## 2020-01-30 NOTE — Telephone Encounter (Signed)
Liji a physical therapist from [place] calling to let us know that the patient is too weak to do her second visit for this week. Liji and family wondering if there needs a referral to be done to pallative care or something because the patient is very weak isn't able to get around well and isn't eating much at all because of her thrush infection. Liji states the patient has gone down hill a lot. Liji said it might be best to reach out to the daughter to figure out what would be next for the patient.

## 2020-01-30 NOTE — Progress Notes (Signed)
RFV: follow up for hospitalization in late October for ecoli sespis  Patient ID: Kayla Thompson, female   DOB: [birth year], 84 y.o.   MRN: [id number]  HPI  84yo F with hx of ovarian cancer, on chronic steroids, with recurrent uti hx presented with sepsis due to urinary source, found to have ESBL ecoli bacteremia and UTI, treated with ertapenem for 7 days. Had follow up labs on 11/2 that showed wbc of 11, esr 45, crp 14  Outpatient Encounter Medications as of 01/30/2020  Medication Sig  . albuterol (PROAIR HFA) 108 (90 Base) MCG/ACT inhaler Inhale 2 puffs into the lungs every 6 (six) hours as needed for wheezing or shortness of breath.  Marland Kitchen amitriptyline (ELAVIL) 10 MG tablet Take 1 tablet (10 mg total) by mouth at bedtime.  . baclofen (LIORESAL) 10 MG tablet TAKE 0.5-1 TABLET BY MOUTH TWICE DAILY AS NEEDED FOR MUSCLE SPASMS OR PAIN (Patient taking differently: Take 5-10 mg by mouth 2 (two) times daily. TAKE 0.5-1 TABLET BY MOUTH TWICE DAILY AS NEEDED FOR MUSCLE SPASMS OR PAIN)  . BD PEN NEEDLE NANO 2ND GEN 32G X 4 MM MISC USE AS DIRECTED 4 TIMES A DAY  . busPIRone (BUSPAR) 5 MG tablet Take 1-2 tablets (5-10 mg total) by mouth daily as needed. Anxiety  . calcium carbonate (OSCAL) 1500 (600 Ca) MG TABS tablet Take 600 mg by mouth 2 (two) times daily with a meal.  . carvedilol (COREG) 3.125 MG tablet Take 1 tablet (3.125 mg total) by mouth 2 (two) times daily with a meal. (Patient not taking: Reported on 01/25/2020)  . Cholecalciferol (VITAMIN D3) 2000 units TABS Take 2 tablets by mouth daily.   Marland Kitchen Dextromethorphan-Guaifenesin (MUCINEX DM MAXIMUM STRENGTH) 60-1200 MG TB12 Take 1 tablet by mouth 2 (two) times daily. Reported on 06/23/2015  . DULoxetine (CYMBALTA) 30 MG capsule Take 1 capsule (30 mg total) by mouth daily. Plan to see her PCP for further refills  . DULoxetine (CYMBALTA) 60 MG capsule Take 1 capsule (60 mg total) by mouth daily.  . feeding supplement (ENSURE ENLIVE / ENSURE PLUS) LIQD  Take 237 mLs by mouth 2 (two) times daily between meals.  . ferrous gluconate (FERGON) 324 MG tablet TAKE 1 TABLET BY MOUTH EVERY DAY WITH BREAKFAST (Patient taking differently: Take 324 mg by mouth daily with breakfast. )  . fluconazole (DIFLUCAN) 100 MG tablet Take 1 tablet (100 mg total) by mouth daily.  . Fluticasone-Umeclidin-Vilant (TRELEGY ELLIPTA) 100-62.5-25 MCG/INH AEPB Inhale 1 puff into the lungs daily. Rinse mouth  . furosemide (LASIX) 20 MG tablet Take 2 tablets (40 mg total) by mouth daily. (Patient not taking: Reported on 01/25/2020)  . glucose blood (FREESTYLE TEST STRIPS) test strip Use to test blood sugar 3 times daily. Dx: E11.65  . HYDROcodone-acetaminophen (NORCO) 10-325 MG tablet Take 1 tablet by mouth every 4 (four) hours as needed.  . insulin glargine (LANTUS SOLOSTAR) 100 UNIT/ML Solostar Pen Inject 15 Units into the skin daily.  Marland Kitchen ipratropium-albuterol (DUONEB) 0.5-2.5 (3) MG/3ML SOLN Inhale 3 mLs into the lungs 3 (three) times daily as needed (shortness of breath). USE 1 VIAL IN NEBULIZER 3 TIMES DAILY  . Lancets (FREESTYLE) lancets Use to test blood sugar 3 times daily. Dx: E11.65  . levothyroxine (SYNTHROID) 50 MCG tablet TAKE 1 TABLET BY MOUTH EVERY DAY BEFORE BREAKFAST (Patient taking differently: Take 50 mcg by mouth daily before breakfast. TAKE 1 TABLET BY MOUTH EVERY DAY BEFORE BREAKFAST)  . lidocaine (LIDODERM) 5 % Place 1  patch onto the skin every 12 (twelve) hours as needed. pain  . lidocaine (XYLOCAINE) 5 % ointment Apply 1 application topically as needed. Apply to external vulvar area  . magnesium oxide (MAG-OX) 400 MG tablet TAKE 1 TABLET BY MOUTH EVERY DAY  . methenamine (MANDELAMINE) 1 g tablet Take 1,000 mg by mouth 2 (two) times daily. Cont.  . Misc Natural Products (TART CHERRY ADVANCED PO) Take by mouth. Once daily  . montelukast (SINGULAIR) 10 MG tablet TAKE 1 TABLET BY MOUTH EVERY DAY (Patient taking differently: Take 10 mg by mouth at bedtime. )  .  Multiple Vitamins-Minerals (CENTRUM ADULTS PO) Take 1 tablet by mouth daily.   Marland Kitchen omeprazole (PRILOSEC) 20 MG capsule TAKE 1 CAPSULE BY MOUTH 2 TIMES A DAY BEFORE A MEAL (Patient taking differently: Take 20 mg by mouth 2 (two) times daily before a meal. )  . predniSONE (DELTASONE) 1 MG tablet Take 2 tablets (2 mg total) by mouth daily.  . rosuvastatin (CRESTOR) 10 MG tablet Take 1 tablet (10 mg total) by mouth every evening.  Marland Kitchen UNABLE TO FIND Take 1,000 mg by mouth daily. Med Name: Tumeric  . vitamin B-12 (CYANOCOBALAMIN) 250 MCG tablet Take 250 mcg by mouth See admin instructions. Twice weekly  ( Monday & Thursday )   No facility-administered encounter medications on file as of 01/30/2020.     Patient Active Problem List   Diagnosis Date Noted  . Sepsis ([place]) 01/12/2020  . Lower extremity edema 11/20/2019  . Pre-operative cardiovascular examination 10/18/2019  . UTI symptoms 05/29/2019  . Dysuria 05/11/2019  . Upper esophageal web 03/27/2019  . Iron deficiency anemia 03/27/2019  . Candida esophagitis ([place]) 02/14/2019  . Encounter for general adult medical examination with abnormal findings 01/09/2019  . Abnormal barium swallow 12/28/2018  . Throat clearing 12/28/2018  . History of colonic polyps 12/28/2018  . Dysphagia 12/28/2018  . Diabetic foot ulcer ([place]) 12/05/2018  . Rotator cuff arthropathy, left 11/16/2018  . Degenerative arthritis of left knee 11/16/2018  . Insomnia 09/26/2018  . Leukocytosis 10/17/2017  . Malnutrition of moderate degree 10/10/2017  . Memory changes 07/29/2017  . Granulomatous lung disease ([place]) 06/14/2017  . Tracheobronchomalacia 06/14/2017  . Dizziness 03/04/2017  . Exercise hypoxemia 07/12/2016  . Diastolic CHF, chronic ([place]) 12/25/2015  . GERD (gastroesophageal reflux disease) 12/25/2015  . Epidermal inclusion cyst 09/02/2015  . Type 2 diabetes mellitus with hyperglycemia, with long-term current use of insulin ([place]) 03/10/2015  . Muscle cramps  03/06/2015  . Cough 01/07/2015  . Bronchiectasis without acute exacerbation ([place]) 01/07/2015  . PMR (polymyalgia rheumatica) ([place]) 01/07/2015  . Osteoarthritis 01/07/2015  . Asthma, chronic 11/12/2014  . Anxiety state 11/12/2014  . Hyperparathyroidism ([place]) 10/28/2014  . Fibromyalgia 09/21/2014  . Personal history of ovarian cancer 08/23/2014  . Osteoporosis 08/23/2014  . Diabetes mellitus with coincident hypertension ([place]) 08/02/2014  . HLD (hyperlipidemia) 08/02/2014  . Hypothyroidism 08/02/2014     Health Maintenance Due  Topic Date Due  . INFLUENZA VACCINE  10/21/2019  . FOOT EXAM  01/08/2020  . URINE MICROALBUMIN  01/08/2020     Review of Systems No uti symptoms at this time, some arthritis like pain. Otherwise 12 point ros is negative Physical Exam  Physical Exam  Constitutional:  oriented to person, place, and time. appears well-developed and well-nourished. No distress.  HENT: [place]/AT, PERRLA, no scleral icterus Mouth/Throat: Oropharynx is clear and moist. No oropharyngeal exudate.  Cardiovascular: Normal rate, regular rhythm and normal heart sounds. Exam reveals no  gallop and no friction rub.  No murmur heard.  Pulmonary/Chest: Effort normal and breath sounds normal. No respiratory distress.  has no wheezes.  Skin: Skin is warm and dry. No rash noted. No erythema.  Psychiatric: a normal mood and affect.  behavior is normal.   CBC Lab Results  Component Value Date   WBC 12.5 (H) 01/25/2020   RBC 3.02 (L) 01/25/2020   HGB 10.3 (L) 01/25/2020   HCT 32.0 (L) 01/25/2020   PLT 490.0 (H) 01/25/2020   MCV 106.3 (H) 01/25/2020   MCH 34.9 (H) 01/15/2020   MCHC 32.2 01/25/2020   RDW 14.2 01/25/2020   LYMPHSABS 5.3 (H) 01/25/2020   MONOABS 0.4 01/25/2020   EOSABS 0.2 01/25/2020    BMET Lab Results  Component Value Date   NA 137 01/25/2020   K 4.0 01/25/2020   CL 100 01/25/2020   CO2 27 01/25/2020   GLUCOSE 51 (L) 01/25/2020   BUN 14 01/25/2020   CREATININE  1.04 01/25/2020   CALCIUM 9.1 01/25/2020   GFRNONAA 47 (L) 01/15/2020   GFRAA 72 01/08/2020    Assessment and Plan Recurrent uti, esbl ecoli with secondary bacteremia = finished course of treatment and now  Recovered  Gave precautions to her daughter to what to look for and recommendations for which oral abtx to try as pre-emptive treatment but still recommend quick medical evaluation since she had rapid deteriation/sepsis presentation on admission  She has not had recurrence in 12 months, so unclear at this stage if she needs chronic suppression

## 2020-01-30 NOTE — Telephone Encounter (Signed)
[facility] Report/Call : ---Caller states her grandmother was diagnosed with thrush 4 days ago and was put on a medication. Yesterday she completely lost her voice, and it has not come back. No pain. She is drinking and eating OK. No fever.  Advised see PCP within 3 days. Spoke with granddaughter, She states the nurse told her it is normal and give it 2 days if no better, to be seen on 3rd day. Granddaughter will call back if no improvement.

## 2020-01-31 NOTE — Telephone Encounter (Signed)
We can do palliative referral if daughter and patient willing.

## 2020-02-01 ENCOUNTER — Ambulatory Visit: Payer: Medicare Other

## 2020-02-01 ENCOUNTER — Telehealth (INDEPENDENT_AMBULATORY_CARE_PROVIDER_SITE_OTHER): Payer: Medicare Other | Admitting: Family

## 2020-02-01 DIAGNOSIS — B372 Candidiasis of skin and nail: Secondary | ICD-10-CM | POA: Diagnosis not present

## 2020-02-01 DIAGNOSIS — J040 Acute laryngitis: Secondary | ICD-10-CM | POA: Diagnosis not present

## 2020-02-01 MED ORDER — FLUCONAZOLE 100 MG PO TABS: 100.0000 mg | ORAL_TABLET | Freq: Every day | ORAL | 0 refills | Status: DC

## 2020-02-01 MED ORDER — CLOTRIMAZOLE-BETAMETHASONE 1-0.05 % EX CREA: 1.0000 "application " | TOPICAL_CREAM | Freq: Two times a day (BID) | CUTANEOUS | 0 refills | Status: DC

## 2020-02-01 NOTE — Patient Instructions (Signed)
Laryngitis °Laryngitis is irritation and swelling (inflammation) of your vocal cords. This condition causes symptoms such as: °· A change in your voice. It may sound low and hoarse. °· Loss of voice. °· Coughing. °· Sore throat. °· Dry throat. °· Stuffy nose. °Depending on the cause, this condition may go away after a short time or may last for more than 3 weeks. Treatment often involves resting your voice and using medicines to soothe your throat. °Follow these instructions at home: °Medicines °· Take over-the-counter and prescription medicines only as told by your doctor. °· If you were prescribed an antibiotic medicine, take it as told by your doctor. Do not stop taking it even if you start to feel better. °General instructions °· Talk as little as possible. Also avoid whispering. °· Write instead of talking. Do this until your voice is back to normal. °· Drink enough fluid to keep your pee (urine) pale yellow. °· Breathe in moist air. Use a humidifier if you live in a dry climate. °· Do not use any products that have nicotine or tobacco in them, such as cigarettes and e-cigarettes. If you need help quitting, ask your doctor. °Contact a doctor if: °· You have a fever. °· Your pain is worse. °· Your symptoms do not get better in 2 weeks. °Get help right away if: °· You cough up blood. °· You have trouble swallowing. °· You have trouble breathing. °Summary °· Laryngitis is inflammation of your vocal cords. °· This condition causes your voice to sound low and hoarse. °· Rest your voice by talking as little as possible. Also avoid whispering. °This information is not intended to replace advice given to you by your health care provider. Make sure you discuss any questions you have with your health care provider. °Document Revised: 02/23/2017 Document Reviewed: 02/23/2017 °Elsevier Patient Education © 2020 [employer] ° °

## 2020-02-01 NOTE — Progress Notes (Signed)
Kayla Thompson is a 84 y.o. female with the following history as recorded in Epic[facility]:  Patient Active Problem List   Diagnosis Date Noted  . Sepsis ([place]) 01/12/2020  . Lower extremity edema 11/20/2019  . Pre-operative cardiovascular examination 10/18/2019  . UTI symptoms 05/29/2019  . Dysuria 05/11/2019  . Upper esophageal web 03/27/2019  . Iron deficiency anemia 03/27/2019  . Candida esophagitis ([place]) 02/14/2019  . Encounter for general adult medical examination with abnormal findings 01/09/2019  . Abnormal barium swallow 12/28/2018  . Throat clearing 12/28/2018  . History of colonic polyps 12/28/2018  . Dysphagia 12/28/2018  . Diabetic foot ulcer ([place]) 12/05/2018  . Rotator cuff arthropathy, left 11/16/2018  . Degenerative arthritis of left knee 11/16/2018  . Insomnia 09/26/2018  . Leukocytosis 10/17/2017  . Malnutrition of moderate degree 10/10/2017  . Memory changes 07/29/2017  . Granulomatous lung disease ([place]) 06/14/2017  . Tracheobronchomalacia 06/14/2017  . Dizziness 03/04/2017  . Exercise hypoxemia 07/12/2016  . Diastolic CHF, chronic ([place]) 12/25/2015  . GERD (gastroesophageal reflux disease) 12/25/2015  . Epidermal inclusion cyst 09/02/2015  . Type 2 diabetes mellitus with hyperglycemia, with long-term current use of insulin ([place]) 03/10/2015  . Muscle cramps 03/06/2015  . Cough 01/07/2015  . Bronchiectasis without acute exacerbation ([place]) 01/07/2015  . PMR (polymyalgia rheumatica) ([place]) 01/07/2015  . Osteoarthritis 01/07/2015  . Asthma, chronic 11/12/2014  . Anxiety state 11/12/2014  . Hyperparathyroidism ([place]) 10/28/2014  . Fibromyalgia 09/21/2014  . Personal history of ovarian cancer 08/23/2014  . Osteoporosis 08/23/2014  . Diabetes mellitus with coincident hypertension ([place]) 08/02/2014  . HLD (hyperlipidemia) 08/02/2014  . Hypothyroidism 08/02/2014    Current Outpatient Medications  Medication Sig Dispense Refill  . albuterol (PROAIR HFA) 108 (90 Base)  MCG/ACT inhaler Inhale 2 puffs into the lungs every 6 (six) hours as needed for wheezing or shortness of breath. 18 g 12  . amitriptyline (ELAVIL) 10 MG tablet Take 1 tablet (10 mg total) by mouth at bedtime. 90 tablet 3  . baclofen (LIORESAL) 10 MG tablet TAKE 0.5-1 TABLET BY MOUTH TWICE DAILY AS NEEDED FOR MUSCLE SPASMS OR PAIN (Patient taking differently: Take 5-10 mg by mouth 2 (two) times daily. TAKE 0.5-1 TABLET BY MOUTH TWICE DAILY AS NEEDED FOR MUSCLE SPASMS OR PAIN) 180 tablet 1  . BD PEN NEEDLE NANO 2ND GEN 32G X 4 MM MISC USE AS DIRECTED 4 TIMES A DAY 100 each 5  . busPIRone (BUSPAR) 5 MG tablet Take 1-2 tablets (5-10 mg total) by mouth daily as needed. Anxiety    . calcium carbonate (OSCAL) 1500 (600 Ca) MG TABS tablet Take 600 mg by mouth 2 (two) times daily with a meal.    . carvedilol (COREG) 3.125 MG tablet Take 1 tablet (3.125 mg total) by mouth 2 (two) times daily with a meal. (Patient not taking: Reported on 01/25/2020) 180 tablet 1  . Cholecalciferol (VITAMIN D3) 2000 units TABS Take 2 tablets by mouth daily.     . clotrimazole-betamethasone (LOTRISONE) cream Apply 1 application topically 2 (two) times daily. 45 g 0  . Dextromethorphan-Guaifenesin (MUCINEX DM MAXIMUM STRENGTH) 60-1200 MG TB12 Take 1 tablet by mouth 2 (two) times daily. Reported on 06/23/2015    . DULoxetine (CYMBALTA) 30 MG capsule Take 1 capsule (30 mg total) by mouth daily. Plan to see her PCP for further refills 90 capsule 1  . DULoxetine (CYMBALTA) 60 MG capsule Take 1 capsule (60 mg total) by mouth daily. 90 capsule 0  . feeding supplement (ENSURE  ENLIVE / ENSURE PLUS) LIQD Take 237 mLs by mouth 2 (two) times daily between meals. 237 mL 12  . ferrous gluconate (FERGON) 324 MG tablet TAKE 1 TABLET BY MOUTH EVERY DAY WITH BREAKFAST (Patient taking differently: Take 324 mg by mouth daily with breakfast. ) 90 tablet 2  . fluconazole (DIFLUCAN) 100 MG tablet Take 1 tablet (100 mg total) by mouth daily. 4 tablet 0  .  Fluticasone-Umeclidin-Vilant (TRELEGY ELLIPTA) 100-62.5-25 MCG/INH AEPB Inhale 1 puff into the lungs daily. Rinse mouth 60 each 11  . furosemide (LASIX) 20 MG tablet Take 2 tablets (40 mg total) by mouth daily. (Patient not taking: Reported on 01/25/2020)    . glucose blood (FREESTYLE TEST STRIPS) test strip Use to test blood sugar 3 times daily. Dx: E11.65 100 each 3  . HYDROcodone-acetaminophen (NORCO) 10-325 MG tablet Take 1 tablet by mouth every 4 (four) hours as needed.    . insulin glargine (LANTUS SOLOSTAR) 100 UNIT/ML Solostar Pen Inject 15 Units into the skin daily.    Marland Kitchen ipratropium-albuterol (DUONEB) 0.5-2.5 (3) MG/3ML SOLN Inhale 3 mLs into the lungs 3 (three) times daily as needed (shortness of breath). USE 1 VIAL IN NEBULIZER 3 TIMES DAILY    . Lancets (FREESTYLE) lancets Use to test blood sugar 3 times daily. Dx: E11.65 100 each 3  . levothyroxine (SYNTHROID) 50 MCG tablet TAKE 1 TABLET BY MOUTH EVERY DAY BEFORE BREAKFAST (Patient taking differently: Take 50 mcg by mouth daily before breakfast. TAKE 1 TABLET BY MOUTH EVERY DAY BEFORE BREAKFAST) 90 tablet 1  . lidocaine (LIDODERM) 5 % Place 1 patch onto the skin every 12 (twelve) hours as needed. pain    . lidocaine (XYLOCAINE) 5 % ointment Apply 1 application topically as needed. Apply to external vulvar area 50 g 1  . magnesium oxide (MAG-OX) 400 MG tablet TAKE 1 TABLET BY MOUTH EVERY DAY 90 tablet 0  . methenamine (MANDELAMINE) 1 g tablet Take 1,000 mg by mouth 2 (two) times daily. Cont.    . Misc Natural Products (TART CHERRY ADVANCED PO) Take by mouth. Once daily    . montelukast (SINGULAIR) 10 MG tablet TAKE 1 TABLET BY MOUTH EVERY DAY (Patient taking differently: Take 10 mg by mouth at bedtime. ) 90 tablet 2  . Multiple Vitamins-Minerals (CENTRUM ADULTS PO) Take 1 tablet by mouth daily.     Marland Kitchen omeprazole (PRILOSEC) 20 MG capsule TAKE 1 CAPSULE BY MOUTH 2 TIMES A DAY BEFORE A MEAL (Patient taking differently: Take 20 mg by mouth 2  (two) times daily before a meal. ) 180 capsule 1  . predniSONE (DELTASONE) 1 MG tablet Take 2 tablets (2 mg total) by mouth daily. 60 tablet 1  . rosuvastatin (CRESTOR) 10 MG tablet Take 1 tablet (10 mg total) by mouth every evening. 90 tablet 1  . UNABLE TO FIND Take 1,000 mg by mouth daily. Med Name: Tumeric    . vitamin B-12 (CYANOCOBALAMIN) 250 MCG tablet Take 250 mcg by mouth See admin instructions. Twice weekly  ( Monday & Thursday )     No current facility-administered medications for this visit.    Allergies: Latex and Penicillins  Past Medical History:  Diagnosis Date  . Anxiety   . Arthritis   . Asthma   . Cataracts, bilateral    removed by surgery  . COPD (chronic obstructive pulmonary disease) ([place])   . Depression   . Diabetes mellitus without complication ([place])    type 2  . Dyspnea  with exertion  . Fibromyalgia   . GERD (gastroesophageal reflux disease)   . History of chemotherapy   . History of fractured pelvis   . Hypertension   . Hypothyroidism   . Osteoporosis 12/2017   T score -2.8 stable from prior DEXA  . Osteoporosis   . Ovarian cancer ([place])   . Pneumonia    several times    Past Surgical History:  Procedure Laterality Date  . ABDOMINAL HYSTERECTOMY  1996  . ANKLE FRACTURE SURGERY     plate and screws  . BIOPSY  02/05/2019   Procedure: BIOPSY;  Surgeon: Rush Landmark Telford Nab., MD;  Location: [facility];  Service: Gastroenterology;;  . BLADDER SUSPENSION    . CATARACT EXTRACTION    . COLONOSCOPY    . ESOPHAGEAL BRUSHING  02/05/2019   Procedure: ESOPHAGEAL BRUSHING;  Surgeon: Rush Landmark Telford Nab., MD;  Location: [facility];  Service: Gastroenterology;;  . ESOPHAGOGASTRODUODENOSCOPY (EGD) WITH PROPOFOL N/A 02/05/2019   Procedure: ESOPHAGOGASTRODUODENOSCOPY (EGD) WITH PROPOFOL with dialtion;  Surgeon: Irving Copas., MD;  Location: [facility];  Service: Gastroenterology;  Laterality: N/A;  . EYE SURGERY     cataracts  removed-bilateral  . hip surgey    . knee surgey    . LUNG SURGERY    . OOPHORECTOMY     BSO  . SAVORY DILATION N/A 02/05/2019   Procedure: SAVORY DILATION;  Surgeon: Rush Landmark Telford Nab., MD;  Location: [facility];  Service: Gastroenterology;  Laterality: N/A;  . TONSILLECTOMY    . UPPER GI ENDOSCOPY      Family History  Problem Relation Age of Onset  . Lung cancer Mother   . CAD Father   . Diabetes Father   . Lung cancer Maternal Aunt   . Diabetes Paternal Aunt   . Diabetes Paternal Uncle   . Diabetes Paternal Grandmother   . Diabetes Paternal Grandfather   . Colon cancer Neg Hx   . Esophageal cancer Neg Hx   . Inflammatory bowel disease Neg Hx   . Liver disease Neg Hx   . Pancreatic cancer Neg Hx   . Rectal cancer Neg Hx   . Stomach cancer Neg Hx     Social History   Tobacco Use  . Smoking status: Never Smoker  . Smokeless tobacco: Never Used  Substance Use Topics  . Alcohol use: No    Alcohol/week: 0.0 standard drinks    Subjective:   I connected with [name] on 02/01/20 at  3:40 PM EST by a video enabled telemedicine application and verified that I am speaking with the correct person using two identifiers.   I discussed the limitations of evaluation and management by telemedicine and the availability of in person appointments. The patient expressed understanding and agreed to proceed. Provider in office/ patient is at home; provider and patient are only 2 people on video call.   Visit was originally made due to concerns for laryngitis x 2 days; per caregiver, she is doing better;  Continuing concerns about redness of skin on inner thighs- currently on oral Diflucan and using topical OTC diaper rash cream;      Objective:  There were no vitals filed for this visit.  General: Well developed, well nourished, in no acute distress  Skin : Warm and dry.  Head: Normocephalic and atraumatic  Lungs: Respirations unlabored;  Neurologic: Alert and  oriented; speech intact; face symmetrical;  Assessment:  1. Acute laryngitis   2. Yeast dermatitis     Plan:  1. Reassurance; discussed that most laryngitis is viral in nature; encouraged warm fluids, resting voice; do not feel antibiotics warranted today; 2. Extend oral Diflucan x 4 more days; will try treating with topical Lotrisone cream; follow up to be determined; may need home health wound consult if symptoms persist;  No follow-ups on file.  No orders of the defined types were placed in this encounter.   Requested Prescriptions   Signed Prescriptions Disp Refills  . fluconazole (DIFLUCAN) 100 MG tablet 4 tablet 0    Sig: Take 1 tablet (100 mg total) by mouth daily.  . clotrimazole-betamethasone (LOTRISONE) cream 45 g 0    Sig: Apply 1 application topically 2 (two) times daily.

## 2020-02-05 ENCOUNTER — Encounter: Payer: Self-pay | Admitting: Internal Medicine

## 2020-02-05 ENCOUNTER — Other Ambulatory Visit: Payer: Self-pay

## 2020-02-05 ENCOUNTER — Ambulatory Visit (INDEPENDENT_AMBULATORY_CARE_PROVIDER_SITE_OTHER): Payer: Medicare Other | Admitting: Internal Medicine

## 2020-02-05 VITALS — BP 142/80 | HR 83 | Ht 59.0 in | Wt 125.2 lb

## 2020-02-05 DIAGNOSIS — I10 Essential (primary) hypertension: Secondary | ICD-10-CM

## 2020-02-05 DIAGNOSIS — R600 Localized edema: Secondary | ICD-10-CM

## 2020-02-05 DIAGNOSIS — E119 Type 2 diabetes mellitus without complications: Secondary | ICD-10-CM | POA: Diagnosis not present

## 2020-02-05 DIAGNOSIS — I739 Peripheral vascular disease, unspecified: Secondary | ICD-10-CM | POA: Diagnosis not present

## 2020-02-05 NOTE — Progress Notes (Signed)
Cardiology Office Note:    Date:  02/05/2020   ID:  Kayla Thompson, DOB 1935/10/22, MRN [id number]  PCP:  [name], MD   Referring MD: Pricilla Holm A, *   Follow up after hospitalization.  History of Present Illness:    Kayla Thompson is a 84 y.o. female with a hx of HTN, HFpEF, Chromic Asthma with Granuloamtous Disease, Type II Diabetes Mellitus, Dysuria, who presents for for leg swelling 12/21/19.  Had Echo with Grade 1 diastolic dysfunction, Had arterial duplex with mild PAD but biphasic flow (elevation in TBI).  In interim had hospitalization for Thrush and sepsis from UTI.  Has had lasix and Coreg stopped in the setting of low BP.  Patient notes that he leg swelling has very much improved.  Ulcer is gone.  Lasix did not improve her legs during the interim.  No leg pain.  No SOB, NO DOE, PND and orthopnea.  No syncope and near syncope.     Past Medical History:  Diagnosis Date  . Anxiety   . Arthritis   . Asthma   . Cataracts, bilateral    removed by surgery  . COPD (chronic obstructive pulmonary disease) ([place])   . Depression   . Diabetes mellitus without complication ([place])    type 2  . Dyspnea    with exertion  . Fibromyalgia   . GERD (gastroesophageal reflux disease)   . History of chemotherapy   . History of fractured pelvis   . Hypertension   . Hypothyroidism   . Osteoporosis 12/2017   T score -2.8 stable from prior DEXA  . Osteoporosis   . Ovarian cancer ([place])   . Pneumonia    several times    Past Surgical History:  Procedure Laterality Date  . ABDOMINAL HYSTERECTOMY  1996  . ANKLE FRACTURE SURGERY     plate and screws  . BIOPSY  02/05/2019   Procedure: BIOPSY;  Surgeon: Rush Landmark Telford Nab., MD;  Location: [facility];  Service: Gastroenterology;;  . BLADDER SUSPENSION    . CATARACT EXTRACTION    . COLONOSCOPY    . ESOPHAGEAL BRUSHING  02/05/2019   Procedure: ESOPHAGEAL BRUSHING;  Surgeon: Rush Landmark Telford Nab., MD;   Location: [facility];  Service: Gastroenterology;;  . ESOPHAGOGASTRODUODENOSCOPY (EGD) WITH PROPOFOL N/A 02/05/2019   Procedure: ESOPHAGOGASTRODUODENOSCOPY (EGD) WITH PROPOFOL with dialtion;  Surgeon: Irving Copas., MD;  Location: [facility];  Service: Gastroenterology;  Laterality: N/A;  . EYE SURGERY     cataracts removed-bilateral  . hip surgey    . knee surgey    . LUNG SURGERY    . OOPHORECTOMY     BSO  . SAVORY DILATION N/A 02/05/2019   Procedure: SAVORY DILATION;  Surgeon: Rush Landmark Telford Nab., MD;  Location: [facility];  Service: Gastroenterology;  Laterality: N/A;  . TONSILLECTOMY    . UPPER GI ENDOSCOPY     Current Medications: Current Meds  Medication Sig  . albuterol (PROAIR HFA) 108 (90 Base) MCG/ACT inhaler Inhale 2 puffs into the lungs every 6 (six) hours as needed for wheezing or shortness of breath.  Marland Kitchen amitriptyline (ELAVIL) 10 MG tablet Take 1 tablet (10 mg total) by mouth at bedtime.  . baclofen (LIORESAL) 10 MG tablet TAKE 0.5-1 TABLET BY MOUTH TWICE DAILY AS NEEDED FOR MUSCLE SPASMS OR PAIN  . BD PEN NEEDLE NANO 2ND GEN 32G X 4 MM MISC USE AS DIRECTED 4 TIMES A DAY  . busPIRone (BUSPAR) 5 MG tablet Take 1-2 tablets (  5-10 mg total) by mouth daily as needed. Anxiety  . calcium carbonate (OSCAL) 1500 (600 Ca) MG TABS tablet Take 600 mg by mouth 2 (two) times daily with a meal.  . carvedilol (COREG) 3.125 MG tablet Take 1 tablet (3.125 mg total) by mouth 2 (two) times daily with a meal.  . Cholecalciferol (VITAMIN D3) 2000 units TABS Take 2 tablets by mouth daily.   . clotrimazole-betamethasone (LOTRISONE) cream Apply 1 application topically 2 (two) times daily.  Marland Kitchen Dextromethorphan-Guaifenesin (MUCINEX DM MAXIMUM STRENGTH) 60-1200 MG TB12 Take 1 tablet by mouth 2 (two) times daily. Reported on 06/23/2015  . DULoxetine (CYMBALTA) 30 MG capsule Take 1 capsule (30 mg total) by mouth daily. Plan to see her PCP for further refills  . DULoxetine (CYMBALTA) 60  MG capsule Take 1 capsule (60 mg total) by mouth daily.  . feeding supplement (ENSURE ENLIVE / ENSURE PLUS) LIQD Take 237 mLs by mouth 2 (two) times daily between meals.  . ferrous gluconate (FERGON) 324 MG tablet TAKE 1 TABLET BY MOUTH EVERY DAY WITH BREAKFAST  . fluconazole (DIFLUCAN) 100 MG tablet Take 1 tablet (100 mg total) by mouth daily.  . Fluticasone-Umeclidin-Vilant (TRELEGY ELLIPTA) 100-62.5-25 MCG/INH AEPB Inhale 1 puff into the lungs daily. Rinse mouth  . furosemide (LASIX) 20 MG tablet Take 2 tablets (40 mg total) by mouth daily.  Marland Kitchen glucose blood (FREESTYLE TEST STRIPS) test strip Use to test blood sugar 3 times daily. Dx: E11.65  . HYDROcodone-acetaminophen (NORCO) 10-325 MG tablet Take 1 tablet by mouth every 4 (four) hours as needed.  . insulin glargine (LANTUS SOLOSTAR) 100 UNIT/ML Solostar Pen Inject 15 Units into the skin daily.  Marland Kitchen ipratropium-albuterol (DUONEB) 0.5-2.5 (3) MG/3ML SOLN Inhale 3 mLs into the lungs 3 (three) times daily as needed (shortness of breath). USE 1 VIAL IN NEBULIZER 3 TIMES DAILY  . Lancets (FREESTYLE) lancets Use to test blood sugar 3 times daily. Dx: E11.65  . levothyroxine (SYNTHROID) 50 MCG tablet TAKE 1 TABLET BY MOUTH EVERY DAY BEFORE BREAKFAST  . lidocaine (LIDODERM) 5 % Place 1 patch onto the skin every 12 (twelve) hours as needed. pain  . lidocaine (XYLOCAINE) 5 % ointment Apply 1 application topically as needed. Apply to external vulvar area  . magnesium oxide (MAG-OX) 400 MG tablet TAKE 1 TABLET BY MOUTH EVERY DAY  . methenamine (MANDELAMINE) 1 g tablet Take 1,000 mg by mouth 2 (two) times daily. Cont.  . Misc Natural Products (TART CHERRY ADVANCED PO) Take by mouth. Once daily  . montelukast (SINGULAIR) 10 MG tablet TAKE 1 TABLET BY MOUTH EVERY DAY  . Multiple Vitamins-Minerals (CENTRUM ADULTS PO) Take 1 tablet by mouth daily.   Marland Kitchen omeprazole (PRILOSEC) 20 MG capsule TAKE 1 CAPSULE BY MOUTH 2 TIMES A DAY BEFORE A MEAL  . predniSONE  (DELTASONE) 1 MG tablet Take 2 tablets (2 mg total) by mouth daily.  . rosuvastatin (CRESTOR) 10 MG tablet Take 1 tablet (10 mg total) by mouth every evening.  Marland Kitchen UNABLE TO FIND Take 1,000 mg by mouth daily. Med Name: Tumeric  . vitamin B-12 (CYANOCOBALAMIN) 250 MCG tablet Take 250 mcg by mouth See admin instructions. Twice weekly  ( Monday & Thursday )    Allergies:   Latex and Penicillins   Social History   Socioeconomic History  . Marital status: Widowed    Spouse name: Not on file  . Number of children: 2  . Years of education: Not on file  . Highest education level: Not  on file  Occupational History  . Not on file  Tobacco Use  . Smoking status: Never Smoker  . Smokeless tobacco: Never Used  Vaping Use  . Vaping Use: Never used  Substance and Sexual Activity  . Alcohol use: No    Alcohol/week: 0.0 standard drinks  . Drug use: No  . Sexual activity: Not Currently    Birth control/protection: Post-menopausal    Comment: declined sexual Hx questions  Other Topics Concern  . Not on file  Social History Narrative  . Not on file   Social Determinants of Health   Financial Resource Strain:   . Difficulty of Paying Living Expenses: Not on file  Food Insecurity:   . Worried About Charity fundraiser in the Last Year: Not on file  . Ran Out of Food in the Last Year: Not on file  Transportation Needs:   . Lack of Transportation (Medical): Not on file  . Lack of Transportation (Non-Medical): Not on file  Physical Activity:   . Days of Exercise per Week: Not on file  . Minutes of Exercise per Session: Not on file  Stress:   . Feeling of Stress : Not on file  Social Connections:   . Frequency of Communication with Friends and Family: Not on file  . Frequency of Social Gatherings with Friends and Family: Not on file  . Attends Religious Services: Not on file  . Active Member of Clubs or Organizations: Not on file  . Attends Archivist Meetings: Not on file  .  Marital Status: Not on file    Family History: The patient's family history includes CAD in her father; Diabetes in her father, paternal aunt, paternal grandfather, paternal grandmother, and paternal uncle; Lung cancer in her maternal aunt and mother. There is no history of Colon cancer, Esophageal cancer, Inflammatory bowel disease, Liver disease, Pancreatic cancer, Rectal cancer, or Stomach cancer.  ROS:   Please see the history of present illness.    All other systems reviewed and are negative.  EKGs/Labs/Other Studies Reviewed:    The following studies were reviewed today:  EKG:   10/16/19 SR, 79, No ST/T changes  Recent Labs: 10/16/2019: TSH 0.76 01/12/2020: B Natriuretic Peptide 264.6; Magnesium 2.0 01/25/2020: ALT 23; BUN 14; Creatinine, Ser 1.04; Hemoglobin 10.3; Platelets 490.0; Potassium 4.0; Pro B Natriuretic peptide (BNP) 103.0; Sodium 137   Albumin 4.0 UA Protein negative of 10/12/19 TSH 0.76 with Free T4 1.1  Recent Lipid Panel    Component Value Date/Time   CHOL 132 10/16/2019 1506   TRIG 114 10/16/2019 1506   HDL 55 10/16/2019 1506   CHOLHDL 2.4 10/16/2019 1506   VLDL 26.0 10/12/2018 1517   LDLCALC 57 10/16/2019 1506   Echo IMPRESSIONS   1. Left ventricular ejection fraction, by estimation, is 55 to 60%. The  left ventricle has normal function. The left ventricle has no regional  wall motion abnormalities. Left ventricular diastolic parameters are  consistent with Grade I diastolic  dysfunction (impaired relaxation).  2. Right ventricular systolic function is normal. The right ventricular  size is normal.  3. The mitral valve is normal in structure. No evidence of mitral valve  regurgitation. No evidence of mitral stenosis. Moderate mitral annular  calcification.  4. The aortic valve is calcified. There is moderate calcification of the  aortic valve. There is moderate thickening of the aortic valve. Aortic  valve regurgitation is not visualized. Mild  aortic valve stenosis. Aortic  valve area, by VTI  measures 1.39  cm. Aortic valve mean gradient measures 10.0 mmHg. Aortic valve Vmax  measures 2.00 m/s.  5. The inferior vena cava is normal in size with greater than 50%  respiratory variability, suggesting right atrial pressure of 3 mmHg.   Physical Exam:    VS:   Today's Vitals   02/05/20 1633  BP: (!) 142/80  Pulse: 83  SpO2: 99%  Weight: 125 lb 3.2 oz (56.8 kg)  Height: 4\' 11"  (1.499 m)   Body mass index is 25.29 kg/m.  Wt Readings from Last 3 Encounters:  02/05/20 125 lb 3.2 oz (56.8 kg)  01/12/20 135 lb 12.9 oz (61.6 kg)  12/31/19 142 lb (64.4 kg)    GEN: Well nourished, well developed in no acute distress HEENT: Normal NECK: No JVD; No carotid bruits LYMPHATICS: No lymphadenopathy CARDIAC: RRR, no murmurs, rubs, gallops RESPIRATORY:  Clear to auscultation without rales, wheezing or rhonchi  ABDOMEN: Soft, non-tender, non-distended MUSCULOSKELETAL: 1+  edema; ulceration now gone  SKIN: Warm and dry NEUROLOGIC:  Alert and oriented x 3 PSYCHIATRIC:  Normal affect   ASSESSMENT:    1. PAD (peripheral artery disease) ([place])   2. Diabetes mellitus with coincident hypertension ([place])   3. Lower extremity edema    PLAN:    In order of problems listed above:  Lower extremity Edema (bilateral) with mild PAD (TBI's) Diabetes with HTN Failure to thrive with nausea or diarrhea Ambulatory BP ~ 100/64 - will increase utilization of compression stockings - no evidence of hypoproteinemia, nephrotic syndrome, thyroid disease based on prior testing - on no iatrogenic medications - will hold lasix and will discontinue coreg - We will hold off start plavix 75 and will refer to vein/vascular specialist for evaluation   Six month follow up unless new symptoms or abnormal test results warranting change in plan Would be reasonable for Virtual Follow up Would be reasonable for  APP Follow up  Medication Adjustments/Labs and  Tests Ordered: Current medicines are reviewed at length with the patient today.  Concerns regarding medicines are outlined above.  No orders of the defined types were placed in this encounter.  No orders of the defined types were placed in this encounter.   There are no Patient Instructions on file for this visit.   Signed, Werner Lean, MD  02/05/2020 4:56 PM    [facility]

## 2020-02-05 NOTE — Patient Instructions (Signed)
Medication Instructions:  Your physician recommends that you continue on your current medications as directed. Please refer to the Current Medication list given to you today.  *If you need a refill on your cardiac medications before your next appointment, please call your pharmacy*   Lab Work: None ordered   If you have labs (blood work) drawn today and your tests are completely normal, you will receive your results only by: Marland Kitchen MyChart Message (if you have MyChart) OR . A paper copy in the mail If you have any lab test that is abnormal or we need to change your treatment, we will call you to review the results.   Testing/Procedures: Your physician has referred you to see Vein and Vascular for Peripheral arterial disease    Follow-Up: At [facility], you and your health needs are our priority.  As part of our continuing mission to provide you with exceptional heart care, we have created designated Provider Care Teams.  These Care Teams include your primary Cardiologist (physician) and Advanced Practice Providers (APPs -  Physician Assistants and Nurse Practitioners) who all work together to provide you with the care you need, when you need it.  We recommend signing up for the patient portal called "MyChart".  Sign up information is provided on this After Visit Summary.  MyChart is used to connect with patients for Virtual Visits (Telemedicine).  Patients are able to view lab/test results, encounter notes, upcoming appointments, etc.  Non-urgent messages can be sent to your provider as well.   To learn more about what you can do with MyChart, go to [email or web address].    Your next appointment:   6 month(s)  The format for your next appointment:   In Person  Provider:   Rudean Haskell, MD   Other Instructions None

## 2020-02-06 ENCOUNTER — Telehealth: Payer: Self-pay | Admitting: Internal Medicine

## 2020-02-06 DIAGNOSIS — N390 Urinary tract infection, site not specified: Secondary | ICD-10-CM | POA: Diagnosis not present

## 2020-02-06 DIAGNOSIS — Z452 Encounter for adjustment and management of vascular access device: Secondary | ICD-10-CM | POA: Diagnosis not present

## 2020-02-06 DIAGNOSIS — G9341 Metabolic encephalopathy: Secondary | ICD-10-CM | POA: Diagnosis not present

## 2020-02-06 DIAGNOSIS — N179 Acute kidney failure, unspecified: Secondary | ICD-10-CM | POA: Diagnosis not present

## 2020-02-06 DIAGNOSIS — Z1612 Extended spectrum beta lactamase (ESBL) resistance: Secondary | ICD-10-CM | POA: Diagnosis not present

## 2020-02-06 DIAGNOSIS — A4151 Sepsis due to Escherichia coli [E. coli]: Secondary | ICD-10-CM | POA: Diagnosis not present

## 2020-02-06 NOTE — Telephone Encounter (Signed)
Liji from [facility] calling requesting verbal orders to decrease frequency to only one time next week due to the holidays.  Was also wondering if we could send in a prescription for a new glucometer, patient is currently using the freestyle light and its 82 years old so it is very worn down.  Liji # [phone] Okay to lvm

## 2020-02-07 NOTE — Telephone Encounter (Signed)
Left vm for Liji for verbal orders will send order for glucometer.

## 2020-02-07 NOTE — Telephone Encounter (Signed)
Fine to both requests.

## 2020-02-08 ENCOUNTER — Other Ambulatory Visit: Payer: Self-pay

## 2020-02-08 ENCOUNTER — Telehealth: Payer: Self-pay | Admitting: Family

## 2020-02-08 DIAGNOSIS — G9341 Metabolic encephalopathy: Secondary | ICD-10-CM | POA: Diagnosis not present

## 2020-02-08 DIAGNOSIS — N390 Urinary tract infection, site not specified: Secondary | ICD-10-CM | POA: Diagnosis not present

## 2020-02-08 DIAGNOSIS — N179 Acute kidney failure, unspecified: Secondary | ICD-10-CM | POA: Diagnosis not present

## 2020-02-08 DIAGNOSIS — Z1612 Extended spectrum beta lactamase (ESBL) resistance: Secondary | ICD-10-CM | POA: Diagnosis not present

## 2020-02-08 DIAGNOSIS — Z452 Encounter for adjustment and management of vascular access device: Secondary | ICD-10-CM | POA: Diagnosis not present

## 2020-02-08 DIAGNOSIS — A4151 Sepsis due to Escherichia coli [E. coli]: Secondary | ICD-10-CM | POA: Diagnosis not present

## 2020-02-08 MED ORDER — FLUCONAZOLE 100 MG PO TABS: 100.0000 mg | ORAL_TABLET | Freq: Every day | ORAL | 0 refills | Status: DC

## 2020-02-08 MED ORDER — CLOTRIMAZOLE-BETAMETHASONE 1-0.05 % EX CREA: 1.0000 "application " | TOPICAL_CREAM | Freq: Two times a day (BID) | CUTANEOUS | 0 refills | Status: DC

## 2020-02-08 NOTE — Telephone Encounter (Signed)
Gave last note to daughter.

## 2020-02-08 NOTE — Telephone Encounter (Signed)
Patient's daughter, [name] is calling and requesting a refill on the following medication. She states the patient's thrush is better but her voice keeps going in and out. Wanted to know if maybe something strong can be called in. [name] is requesting a call back.   clotrimazole-betamethasone (LOTRISONE) cream  fluconazole (DIFLUCAN) 100 MG tablet

## 2020-02-08 NOTE — Telephone Encounter (Signed)
It is probably time for patient to come see Dr. Sharlet Salina in person since they haven't had in office visit for a few months. She has openings next week so please schedule with Dr. Sharlet Salina.  I will extend the Diflucan and refill the Lotrisone cream.

## 2020-02-11 DIAGNOSIS — M542 Cervicalgia: Secondary | ICD-10-CM | POA: Diagnosis not present

## 2020-02-11 DIAGNOSIS — Z79899 Other long term (current) drug therapy: Secondary | ICD-10-CM | POA: Diagnosis not present

## 2020-02-11 DIAGNOSIS — Z794 Long term (current) use of insulin: Secondary | ICD-10-CM | POA: Diagnosis not present

## 2020-02-11 DIAGNOSIS — E119 Type 2 diabetes mellitus without complications: Secondary | ICD-10-CM | POA: Diagnosis not present

## 2020-02-12 ENCOUNTER — Telehealth: Payer: Self-pay | Admitting: Internal Medicine

## 2020-02-12 ENCOUNTER — Other Ambulatory Visit: Payer: Self-pay

## 2020-02-12 MED ORDER — FREESTYLE TEST VI STRP
ORAL_STRIP | 3 refills | Status: DC
Start: 2020-02-12 — End: 2020-02-29

## 2020-02-12 NOTE — Telephone Encounter (Signed)
Patient request a new RX for Freestyle Lite Test Strips be sent to  [id number] [address], [place] Phone:  [phone]  Fax:  [phone]

## 2020-02-13 DIAGNOSIS — N179 Acute kidney failure, unspecified: Secondary | ICD-10-CM | POA: Diagnosis not present

## 2020-02-13 DIAGNOSIS — A4151 Sepsis due to Escherichia coli [E. coli]: Secondary | ICD-10-CM | POA: Diagnosis not present

## 2020-02-13 DIAGNOSIS — N390 Urinary tract infection, site not specified: Secondary | ICD-10-CM | POA: Diagnosis not present

## 2020-02-13 DIAGNOSIS — G9341 Metabolic encephalopathy: Secondary | ICD-10-CM | POA: Diagnosis not present

## 2020-02-13 DIAGNOSIS — Z1612 Extended spectrum beta lactamase (ESBL) resistance: Secondary | ICD-10-CM | POA: Diagnosis not present

## 2020-02-13 DIAGNOSIS — Z452 Encounter for adjustment and management of vascular access device: Secondary | ICD-10-CM | POA: Diagnosis not present

## 2020-02-15 DIAGNOSIS — N390 Urinary tract infection, site not specified: Secondary | ICD-10-CM | POA: Diagnosis not present

## 2020-02-15 DIAGNOSIS — G8929 Other chronic pain: Secondary | ICD-10-CM | POA: Diagnosis not present

## 2020-02-15 DIAGNOSIS — Z1612 Extended spectrum beta lactamase (ESBL) resistance: Secondary | ICD-10-CM | POA: Diagnosis not present

## 2020-02-15 DIAGNOSIS — M1990 Unspecified osteoarthritis, unspecified site: Secondary | ICD-10-CM | POA: Diagnosis not present

## 2020-02-15 DIAGNOSIS — I5033 Acute on chronic diastolic (congestive) heart failure: Secondary | ICD-10-CM | POA: Diagnosis not present

## 2020-02-15 DIAGNOSIS — Z792 Long term (current) use of antibiotics: Secondary | ICD-10-CM | POA: Diagnosis not present

## 2020-02-15 DIAGNOSIS — J449 Chronic obstructive pulmonary disease, unspecified: Secondary | ICD-10-CM | POA: Diagnosis not present

## 2020-02-15 DIAGNOSIS — G9341 Metabolic encephalopathy: Secondary | ICD-10-CM | POA: Diagnosis not present

## 2020-02-15 DIAGNOSIS — M353 Polymyalgia rheumatica: Secondary | ICD-10-CM | POA: Diagnosis not present

## 2020-02-15 DIAGNOSIS — Z7952 Long term (current) use of systemic steroids: Secondary | ICD-10-CM | POA: Diagnosis not present

## 2020-02-15 DIAGNOSIS — I110 Hypertensive heart disease with heart failure: Secondary | ICD-10-CM | POA: Diagnosis not present

## 2020-02-15 DIAGNOSIS — M25511 Pain in right shoulder: Secondary | ICD-10-CM | POA: Diagnosis not present

## 2020-02-15 DIAGNOSIS — F32A Depression, unspecified: Secondary | ICD-10-CM | POA: Diagnosis not present

## 2020-02-15 DIAGNOSIS — Z794 Long term (current) use of insulin: Secondary | ICD-10-CM | POA: Diagnosis not present

## 2020-02-15 DIAGNOSIS — E039 Hypothyroidism, unspecified: Secondary | ICD-10-CM | POA: Diagnosis not present

## 2020-02-15 DIAGNOSIS — E1165 Type 2 diabetes mellitus with hyperglycemia: Secondary | ICD-10-CM | POA: Diagnosis not present

## 2020-02-15 DIAGNOSIS — A4151 Sepsis due to Escherichia coli [E. coli]: Secondary | ICD-10-CM | POA: Diagnosis not present

## 2020-02-15 DIAGNOSIS — Z8701 Personal history of pneumonia (recurrent): Secondary | ICD-10-CM | POA: Diagnosis not present

## 2020-02-15 DIAGNOSIS — N179 Acute kidney failure, unspecified: Secondary | ICD-10-CM | POA: Diagnosis not present

## 2020-02-15 DIAGNOSIS — M810 Age-related osteoporosis without current pathological fracture: Secondary | ICD-10-CM | POA: Diagnosis not present

## 2020-02-15 DIAGNOSIS — M797 Fibromyalgia: Secondary | ICD-10-CM | POA: Diagnosis not present

## 2020-02-15 DIAGNOSIS — Z5181 Encounter for therapeutic drug level monitoring: Secondary | ICD-10-CM | POA: Diagnosis not present

## 2020-02-15 DIAGNOSIS — F419 Anxiety disorder, unspecified: Secondary | ICD-10-CM | POA: Diagnosis not present

## 2020-02-15 DIAGNOSIS — Z452 Encounter for adjustment and management of vascular access device: Secondary | ICD-10-CM | POA: Diagnosis not present

## 2020-02-15 DIAGNOSIS — Z87440 Personal history of urinary (tract) infections: Secondary | ICD-10-CM | POA: Diagnosis not present

## 2020-02-18 ENCOUNTER — Ambulatory Visit (INDEPENDENT_AMBULATORY_CARE_PROVIDER_SITE_OTHER): Payer: Medicare Other | Admitting: Internal Medicine

## 2020-02-18 ENCOUNTER — Encounter: Payer: Self-pay | Admitting: Internal Medicine

## 2020-02-18 ENCOUNTER — Ambulatory Visit (INDEPENDENT_AMBULATORY_CARE_PROVIDER_SITE_OTHER): Payer: Medicare Other

## 2020-02-18 ENCOUNTER — Other Ambulatory Visit: Payer: Self-pay

## 2020-02-18 VITALS — BP 134/72 | HR 96 | Temp 98.2°F | Wt 126.0 lb

## 2020-02-18 DIAGNOSIS — M25562 Pain in left knee: Secondary | ICD-10-CM

## 2020-02-18 DIAGNOSIS — M179 Osteoarthritis of knee, unspecified: Secondary | ICD-10-CM | POA: Diagnosis not present

## 2020-02-18 DIAGNOSIS — E559 Vitamin D deficiency, unspecified: Secondary | ICD-10-CM | POA: Diagnosis not present

## 2020-02-18 DIAGNOSIS — M8580 Other specified disorders of bone density and structure, unspecified site: Secondary | ICD-10-CM | POA: Diagnosis not present

## 2020-02-18 DIAGNOSIS — Z471 Aftercare following joint replacement surgery: Secondary | ICD-10-CM | POA: Diagnosis not present

## 2020-02-18 DIAGNOSIS — R300 Dysuria: Secondary | ICD-10-CM | POA: Diagnosis not present

## 2020-02-18 DIAGNOSIS — E43 Unspecified severe protein-calorie malnutrition: Secondary | ICD-10-CM | POA: Diagnosis not present

## 2020-02-18 DIAGNOSIS — Z96651 Presence of right artificial knee joint: Secondary | ICD-10-CM | POA: Diagnosis not present

## 2020-02-18 DIAGNOSIS — E538 Deficiency of other specified B group vitamins: Secondary | ICD-10-CM | POA: Diagnosis not present

## 2020-02-18 DIAGNOSIS — M25561 Pain in right knee: Secondary | ICD-10-CM | POA: Diagnosis not present

## 2020-02-18 DIAGNOSIS — E039 Hypothyroidism, unspecified: Secondary | ICD-10-CM

## 2020-02-18 NOTE — Patient Instructions (Signed)
We will x-ray the knees today.

## 2020-02-18 NOTE — Progress Notes (Signed)
   Subjective:   Patient ID: Kayla Thompson, female    DOB: 09/30/1935, 84 y.o.   MRN: [id number]  HPI The patient is an 84 YO female coming in for concerns about recent falls (fell today and hit right side including elbow and both knees, she does normally call for health from aides to get up but this time did not, aide saw her up and was able to get there to help lower her to ground instead of falling, did not hit head and no LOC, overall sore in the elbow and knees, skin tear on the right elbow) and declines in health (since hospital stay with sepsis and mental status changes, she has not recovered well, she is having some memory changes and balance problems, appetite poor with weight loss) and possible UTI (not herself recently, poor appetite, weight decreasing, less stable on feet, no burning, has had several UTI recently and 1 time with sepsis with minimal symptoms outside of mental status changes).   Review of Systems  Constitutional: Positive for activity change, appetite change, fatigue and unexpected weight change.  Respiratory: Negative.   Cardiovascular: Negative.   Gastrointestinal: Positive for abdominal pain. Negative for abdominal distention, constipation, diarrhea, nausea and vomiting.  Genitourinary: Positive for urgency. Negative for dysuria and frequency.  Musculoskeletal: Positive for arthralgias, back pain, gait problem and myalgias.  Skin: Positive for wound.  Neurological: Positive for weakness. Negative for dizziness and light-headedness.  Psychiatric/Behavioral: Positive for sleep disturbance. The patient is nervous/anxious.     Objective:  Physical Exam Constitutional:      Appearance: She is well-developed.     Comments: Appears frail  HENT:     Head: Normocephalic and atraumatic.  Cardiovascular:     Rate and Rhythm: Normal rate and regular rhythm.  Pulmonary:     Effort: Pulmonary effort is normal. No respiratory distress.     Breath sounds: Normal breath  sounds. No wheezing or rales.  Abdominal:     General: Bowel sounds are normal. There is no distension.     Palpations: Abdomen is soft.     Tenderness: There is no abdominal tenderness. There is no rebound.  Musculoskeletal:        General: Tenderness present.     Cervical back: Normal range of motion.     Comments: Right elbow pain, both knees tender  Skin:    General: Skin is warm and dry.     Comments: Skin tear right elbow, no signs of infection or surrounding cellulitis  Neurological:     Mental Status: She is alert and oriented to person, place, and time.     Coordination: Coordination abnormal.     Vitals:   02/18/20 1615  BP: 134/72  Pulse: 96  Temp: 98.2 F (36.8 C)  TempSrc: Oral  Weight: 126 lb (57.2 kg)   This visit occurred during the SARS-CoV-2 public health emergency.  Safety protocols were in place, including screening questions prior to the visit, additional usage of staff PPE, and extensive cleaning of exam room while observing appropriate contact time as indicated for disinfecting solutions.   Assessment & Plan:

## 2020-02-19 ENCOUNTER — Other Ambulatory Visit (INDEPENDENT_AMBULATORY_CARE_PROVIDER_SITE_OTHER): Payer: Medicare Other

## 2020-02-19 DIAGNOSIS — E559 Vitamin D deficiency, unspecified: Secondary | ICD-10-CM

## 2020-02-19 DIAGNOSIS — E039 Hypothyroidism, unspecified: Secondary | ICD-10-CM

## 2020-02-19 DIAGNOSIS — R300 Dysuria: Secondary | ICD-10-CM | POA: Diagnosis not present

## 2020-02-19 DIAGNOSIS — N179 Acute kidney failure, unspecified: Secondary | ICD-10-CM | POA: Diagnosis not present

## 2020-02-19 DIAGNOSIS — M25561 Pain in right knee: Secondary | ICD-10-CM | POA: Diagnosis not present

## 2020-02-19 DIAGNOSIS — A4151 Sepsis due to Escherichia coli [E. coli]: Secondary | ICD-10-CM | POA: Diagnosis not present

## 2020-02-19 DIAGNOSIS — E538 Deficiency of other specified B group vitamins: Secondary | ICD-10-CM | POA: Diagnosis not present

## 2020-02-19 DIAGNOSIS — M25562 Pain in left knee: Secondary | ICD-10-CM | POA: Diagnosis not present

## 2020-02-19 DIAGNOSIS — Z452 Encounter for adjustment and management of vascular access device: Secondary | ICD-10-CM | POA: Diagnosis not present

## 2020-02-19 DIAGNOSIS — G9341 Metabolic encephalopathy: Secondary | ICD-10-CM | POA: Diagnosis not present

## 2020-02-19 DIAGNOSIS — N390 Urinary tract infection, site not specified: Secondary | ICD-10-CM | POA: Diagnosis not present

## 2020-02-19 DIAGNOSIS — Z1612 Extended spectrum beta lactamase (ESBL) resistance: Secondary | ICD-10-CM | POA: Diagnosis not present

## 2020-02-19 LAB — COMPREHENSIVE METABOLIC PANEL
ALT: 14 U/L (ref 0–35)
AST: 13 U/L (ref 0–37)
Albumin: 3.4 g/dL — ABNORMAL LOW (ref 3.5–5.2)
Alkaline Phosphatase: 40 U/L (ref 39–117)
BUN: 19 mg/dL (ref 6–23)
CO2: 27 mEq/L (ref 19–32)
Calcium: 8.4 mg/dL (ref 8.4–10.5)
Chloride: 102 mEq/L (ref 96–112)
Creatinine, Ser: 0.87 mg/dL (ref 0.40–1.20)
GFR: 61.94 mL/min (ref >60.00)
Glucose, Bld: 157 mg/dL — ABNORMAL HIGH (ref 70–99)
Potassium: 3.6 mEq/L (ref 3.5–5.1)
Sodium: 137 mEq/L (ref 135–145)
Total Bilirubin: 0.4 mg/dL (ref 0.2–1.2)
Total Protein: 6.4 g/dL (ref 6.0–8.3)

## 2020-02-19 LAB — CBC
HCT: 33.8 % — ABNORMAL LOW (ref 36.0–46.0)
Hemoglobin: 11.1 g/dL — ABNORMAL LOW (ref 12.0–15.0)
MCHC: 32.9 g/dL (ref 30.0–36.0)
MCV: 106.6 fl — ABNORMAL HIGH (ref 78.0–100.0)
Platelets: 324 10*3/uL (ref 150.0–400.0)
RBC: 3.17 Mil/uL — ABNORMAL LOW (ref 3.87–5.11)
RDW: 14.5 % (ref 11.5–15.5)
WBC: 11 10*3/uL — ABNORMAL HIGH (ref 4.0–10.5)

## 2020-02-19 LAB — TSH: TSH: 0.77 u[IU]/mL (ref 0.35–4.50)

## 2020-02-19 LAB — VITAMIN B12: Vitamin B-12: >1526 pg/mL — ABNORMAL HIGH (ref 211–911)

## 2020-02-20 ENCOUNTER — Telehealth: Payer: Self-pay

## 2020-02-20 DIAGNOSIS — N390 Urinary tract infection, site not specified: Secondary | ICD-10-CM | POA: Diagnosis not present

## 2020-02-20 DIAGNOSIS — Z452 Encounter for adjustment and management of vascular access device: Secondary | ICD-10-CM | POA: Diagnosis not present

## 2020-02-20 DIAGNOSIS — N179 Acute kidney failure, unspecified: Secondary | ICD-10-CM | POA: Diagnosis not present

## 2020-02-20 DIAGNOSIS — Z1612 Extended spectrum beta lactamase (ESBL) resistance: Secondary | ICD-10-CM | POA: Diagnosis not present

## 2020-02-20 DIAGNOSIS — A4151 Sepsis due to Escherichia coli [E. coli]: Secondary | ICD-10-CM | POA: Diagnosis not present

## 2020-02-20 DIAGNOSIS — M25562 Pain in left knee: Secondary | ICD-10-CM | POA: Insufficient documentation

## 2020-02-20 DIAGNOSIS — M25561 Pain in right knee: Secondary | ICD-10-CM | POA: Insufficient documentation

## 2020-02-20 DIAGNOSIS — G9341 Metabolic encephalopathy: Secondary | ICD-10-CM | POA: Diagnosis not present

## 2020-02-20 LAB — VITAMIN D 25 HYDROXY (VIT D DEFICIENCY, FRACTURES): VITD: 101.47 ng/mL (ref 30.00–100.00)

## 2020-02-20 NOTE — Assessment & Plan Note (Signed)
Checking x-ray right and left knee due to fall and instability. Treat as appropriate. Using hydrocodone for pain currently.

## 2020-02-20 NOTE — Assessment & Plan Note (Signed)
Weight is declining and appetite is poor. Checking for infection and underlying condition today with labs and urine.

## 2020-02-20 NOTE — Assessment & Plan Note (Signed)
Checking U/A and culture. Treat as appropriate.

## 2020-02-20 NOTE — Telephone Encounter (Signed)
Noted will address in result note.

## 2020-02-21 LAB — URINALYSIS, ROUTINE W REFLEX MICROSCOPIC
Bilirubin Urine: NEGATIVE
Hgb urine dipstick: NEGATIVE
Ketones, ur: NEGATIVE
Leukocytes,Ua: NEGATIVE
Nitrite: NEGATIVE
Specific Gravity, Urine: 1.015 (ref 1.000–1.030)
Total Protein, Urine: NEGATIVE
Urine Glucose: NEGATIVE
Urobilinogen, UA: 0.2 (ref 0.0–1.0)
pH: 7 (ref 5.0–8.0)

## 2020-02-26 ENCOUNTER — Telehealth: Payer: Self-pay

## 2020-02-26 ENCOUNTER — Other Ambulatory Visit: Payer: Self-pay | Admitting: Internal Medicine

## 2020-02-26 ENCOUNTER — Other Ambulatory Visit: Payer: Self-pay | Admitting: Family

## 2020-02-26 DIAGNOSIS — N179 Acute kidney failure, unspecified: Secondary | ICD-10-CM | POA: Diagnosis not present

## 2020-02-26 DIAGNOSIS — Z452 Encounter for adjustment and management of vascular access device: Secondary | ICD-10-CM | POA: Diagnosis not present

## 2020-02-26 DIAGNOSIS — N390 Urinary tract infection, site not specified: Secondary | ICD-10-CM | POA: Diagnosis not present

## 2020-02-26 DIAGNOSIS — G9341 Metabolic encephalopathy: Secondary | ICD-10-CM | POA: Diagnosis not present

## 2020-02-26 DIAGNOSIS — A4151 Sepsis due to Escherichia coli [E. coli]: Secondary | ICD-10-CM | POA: Diagnosis not present

## 2020-02-26 DIAGNOSIS — Z1612 Extended spectrum beta lactamase (ESBL) resistance: Secondary | ICD-10-CM | POA: Diagnosis not present

## 2020-02-26 NOTE — Telephone Encounter (Signed)
Pt daughter call with questions re: result note from 11/30. "Labs are normal, still waiting on the urine tests.  No signs of urine infection although we will wait on the culture also."  Daughter states that her concern is that some of the labs don't seem normal - her B12 is high; should she continue taking B12 OTC? Also verified dose & instructions for Vitamin D3. Daughter verb understanding. Daughter notified of left knee xray "There is severe arthritis and bone loss in the knee. There is a potential mild fracture in the tibia bone which they cannot tell. Give the pain a few days and let us know if not improving."  Daughter states pt is in chronic pain so she would not know if it is getting worse; states she wants results shared with pt's orthopedist Dr Rip Harbour to ensure appropriate follow up.

## 2020-02-27 ENCOUNTER — Other Ambulatory Visit: Payer: Self-pay | Admitting: Internal Medicine

## 2020-02-27 NOTE — Telephone Encounter (Signed)
B12 is fine to continue. Her orthopedic doctor has access to this computer system and would be able to view the x-ray.

## 2020-02-27 NOTE — Progress Notes (Signed)
HPI F never smoker followed for asthma/ COPD/ Tracheobronchomalacia,Bronchiectasis, Granulomatous lung disease,  Chronic Hypoxic Resp Failure,  GERD, complicated by  DM2, Hypothyroid, HBP, dCHF Office Spirometry 01/07/15- Mild obstructive airways disease- FVC 7.2/ 100%, FEV1 1.4/ 83%, FEV1/FVC 0.62, FEF25-75% 44% PFT 02/26/2019- Mild obstruction, Diffusion moderately reduced, no resp to BD, ----------------------------------------------------------------------------------------- .  08/27/19- 84 yoF never smoker followed for asthma/ COPD/ Tracheobronchomalacia,Bronchiectasis, Granulomatous lung disease,  GERD, complicated by  DM2, Hypothyroid, HBP, dCHF,  O2 2L [employer],  Rescue inhaler 1-2x/ week, Trelegy 100 and maintenance prednisone 2mg ( 1 mg x 2), Nebulizer . Singulair,  Has been on maintenance prednisone for decades.  Routine morning cough clear mucus. Uses rescue inhaler 2X week, Trelegy helps. Neb occasionally.  -Not using O2 now at all, but has it. No acute respiratory events.  Incidental chronic UTI managed by Urology. -Frequent falls- rolling walker.  Anticipates shoulder repair- no set date. Discussed risk issues- anesthesia, need for mobilization, likely SNF rehab post-op.   02/28/20-  84 yoF never smoker followed for asthma/ COPD/ Tracheobronchomalacia, Bronchiectasis, Granulomatous lung disease,  GERD, complicated by  DM2, Hypothyroid, HBP, dCHF, GERD, Chronic UTI, Chronic dCHF,  O2 2L [employer],  -Rescue inhaler 1-2x/ week, Trelegy 100 and maintenance prednisone 2mg ( 1 mg x 2), Nebulizer Duoneb. Singulair, Mucinex DM,  Covid vax- 2 Phizer Flu vax- had Hosp 10/26 - confusion due to E.coli UTI sepsis,   DC to SNF -----Patient has productive cough with yellow sputum that was noticed today, shortness of breath with exertion.  Has not been using home O2 lately.  Discussed Covax booster- recommended. CXR 01/28/20- IMPRESSION: Stable scarring right base. No edema or airspace opacity.  Stable cardiac silhouette. Old healed rib fractures. Aortic Atherosclerosis (ICD10-I70.0). ,  ROS-see HPI   + = positive Constitutional:    weight loss, night sweats, fevers, chills, fatigue, lassitude. HEENT:    headaches, +difficulty swallowing, tooth/dental problems, sore throat,  sneezing, itching, ear ache, nasal congestion, post nasal drip, snoring CV:    chest pain, orthopnea, PND, swelling in lower extremities, anasarca,  dizziness, palpitations Resp:   +shortness of breath with exertion or at rest.                +productive cough,  + non-productive cough, coughing up of blood.              change in color of mucus.  +wheezing.   Skin:    rash or lesions. GI:  No-   heartburn, indigestion, abdominal pain, nausea, vomiting, diarrhea,                 change in bowel habits, loss of appetite GU: dysuria, change in color of urine, no urgency or frequency.   flank pain. MS:   +joint pain, stiffness, decreased range of motion, back pain. Neuro-     nothing unusual Psych:  change in mood or affect.  depression or anxiety.   memory loss.  OBJ- Physical Exam General- Alert, Oriented, Affect-appropriate, Distress- none acute,  + overweight Skin- rash-none, lesions- none, excoriation- none Lymphadenopathy- none Head- atraumatic            Eyes- Gross vision intact, PERRLA, conjunctivae and secretions clear            Ears- Hearing, canals-normal            Nose- Clear, no-Septal dev, mucus, polyps, erosion, perforation             Throat- Mallampati II , mucosa clear ,  drainage- none, tonsils- atrophic Neck- flexible , trachea midline, no stridor , thyroid nl, carotid no bruit Chest - symmetrical excursion , unlabored           Heart/CV- RRR , no murmur , no gallop  , no rub, nl s1 s2                           - JVD- none , edema- none, stasis changes- none, varices- none           Lung- + raspy/ coarse, wheeze-none, cough- none , dullness-none, rub- none           Chest wall-  Abd-   Br/ Gen/ Rectal- Not done, not indicated Extrem- cyanosis- none, clubbing, none, atrophy- none, strength- nl Neuro- grossly intact to observation

## 2020-02-27 NOTE — Telephone Encounter (Signed)
[name] (pt's daughter) notified that B12 is ok to continue.  Daughter requested that the x-ray be sent to Dr Rhina Brackett for recommendations.  X-ray results sent per request.

## 2020-02-28 ENCOUNTER — Other Ambulatory Visit: Payer: Self-pay

## 2020-02-28 ENCOUNTER — Ambulatory Visit (INDEPENDENT_AMBULATORY_CARE_PROVIDER_SITE_OTHER): Payer: Medicare Other | Admitting: Internal Medicine

## 2020-02-28 ENCOUNTER — Encounter: Payer: Self-pay | Admitting: Internal Medicine

## 2020-02-28 DIAGNOSIS — K219 Gastro-esophageal reflux disease without esophagitis: Secondary | ICD-10-CM

## 2020-02-28 DIAGNOSIS — J471 Bronchiectasis with (acute) exacerbation: Secondary | ICD-10-CM

## 2020-02-28 MED ORDER — AZITHROMYCIN 250 MG PO TABS: ORAL_TABLET | ORAL | 0 refills | Status: DC

## 2020-02-28 NOTE — Patient Instructions (Signed)
Script sent for Zpak antibiotic for mild acute bronchitis  Ok to continue your current breathing meds  Please call if we can help

## 2020-02-29 ENCOUNTER — Other Ambulatory Visit: Payer: Self-pay | Admitting: Internal Medicine

## 2020-02-29 DIAGNOSIS — Z452 Encounter for adjustment and management of vascular access device: Secondary | ICD-10-CM | POA: Diagnosis not present

## 2020-02-29 DIAGNOSIS — N179 Acute kidney failure, unspecified: Secondary | ICD-10-CM | POA: Diagnosis not present

## 2020-02-29 DIAGNOSIS — G9341 Metabolic encephalopathy: Secondary | ICD-10-CM | POA: Diagnosis not present

## 2020-02-29 DIAGNOSIS — Z1612 Extended spectrum beta lactamase (ESBL) resistance: Secondary | ICD-10-CM | POA: Diagnosis not present

## 2020-02-29 DIAGNOSIS — N390 Urinary tract infection, site not specified: Secondary | ICD-10-CM | POA: Diagnosis not present

## 2020-02-29 DIAGNOSIS — A4151 Sepsis due to Escherichia coli [E. coli]: Secondary | ICD-10-CM | POA: Diagnosis not present

## 2020-03-03 ENCOUNTER — Other Ambulatory Visit: Payer: Self-pay | Admitting: Internal Medicine

## 2020-03-03 DIAGNOSIS — G9341 Metabolic encephalopathy: Secondary | ICD-10-CM | POA: Diagnosis not present

## 2020-03-03 DIAGNOSIS — N179 Acute kidney failure, unspecified: Secondary | ICD-10-CM | POA: Diagnosis not present

## 2020-03-03 DIAGNOSIS — A4151 Sepsis due to Escherichia coli [E. coli]: Secondary | ICD-10-CM | POA: Diagnosis not present

## 2020-03-03 DIAGNOSIS — Z1612 Extended spectrum beta lactamase (ESBL) resistance: Secondary | ICD-10-CM | POA: Diagnosis not present

## 2020-03-03 DIAGNOSIS — N390 Urinary tract infection, site not specified: Secondary | ICD-10-CM | POA: Diagnosis not present

## 2020-03-03 DIAGNOSIS — Z452 Encounter for adjustment and management of vascular access device: Secondary | ICD-10-CM | POA: Diagnosis not present

## 2020-03-04 NOTE — Telephone Encounter (Signed)
Change as requested from [facility]

## 2020-03-06 ENCOUNTER — Telehealth: Payer: Self-pay | Admitting: Internal Medicine

## 2020-03-06 DIAGNOSIS — G9341 Metabolic encephalopathy: Secondary | ICD-10-CM | POA: Diagnosis not present

## 2020-03-06 DIAGNOSIS — N390 Urinary tract infection, site not specified: Secondary | ICD-10-CM | POA: Diagnosis not present

## 2020-03-06 DIAGNOSIS — Z1612 Extended spectrum beta lactamase (ESBL) resistance: Secondary | ICD-10-CM | POA: Diagnosis not present

## 2020-03-06 DIAGNOSIS — N179 Acute kidney failure, unspecified: Secondary | ICD-10-CM | POA: Diagnosis not present

## 2020-03-06 DIAGNOSIS — Z452 Encounter for adjustment and management of vascular access device: Secondary | ICD-10-CM | POA: Diagnosis not present

## 2020-03-06 DIAGNOSIS — A4151 Sepsis due to Escherichia coli [E. coli]: Secondary | ICD-10-CM | POA: Diagnosis not present

## 2020-03-06 NOTE — Telephone Encounter (Signed)
Liji from [facility] called   Needs Verbal orders   To restart PT for patient. Patient is still having some complaints and trouble with certain movements.    Please call he back at [phone]

## 2020-03-06 NOTE — Telephone Encounter (Signed)
Verbal orders given for below.  

## 2020-03-06 NOTE — Telephone Encounter (Signed)
Fine

## 2020-03-10 ENCOUNTER — Telehealth: Payer: Self-pay | Admitting: Internal Medicine

## 2020-03-10 NOTE — Telephone Encounter (Signed)
   Daughter calling Should patient receive booster vaccine

## 2020-03-11 DIAGNOSIS — N3941 Urge incontinence: Secondary | ICD-10-CM | POA: Diagnosis not present

## 2020-03-11 DIAGNOSIS — N3000 Acute cystitis without hematuria: Secondary | ICD-10-CM | POA: Diagnosis not present

## 2020-03-11 NOTE — Telephone Encounter (Signed)
Booster is recommended for all.

## 2020-03-11 NOTE — Telephone Encounter (Signed)
[name] informed of below.  

## 2020-03-12 ENCOUNTER — Telehealth: Payer: Self-pay | Admitting: Internal Medicine

## 2020-03-12 DIAGNOSIS — A4151 Sepsis due to Escherichia coli [E. coli]: Secondary | ICD-10-CM | POA: Diagnosis not present

## 2020-03-12 DIAGNOSIS — G9341 Metabolic encephalopathy: Secondary | ICD-10-CM | POA: Diagnosis not present

## 2020-03-12 DIAGNOSIS — N179 Acute kidney failure, unspecified: Secondary | ICD-10-CM | POA: Diagnosis not present

## 2020-03-12 DIAGNOSIS — Z1612 Extended spectrum beta lactamase (ESBL) resistance: Secondary | ICD-10-CM | POA: Diagnosis not present

## 2020-03-12 DIAGNOSIS — N390 Urinary tract infection, site not specified: Secondary | ICD-10-CM | POA: Diagnosis not present

## 2020-03-12 DIAGNOSIS — Z452 Encounter for adjustment and management of vascular access device: Secondary | ICD-10-CM | POA: Diagnosis not present

## 2020-03-12 NOTE — Telephone Encounter (Signed)
Liji w/ [facility] is requesting verbal orders for PT 1w1, 2w4, 1w2 to work on gait, balance and strengthening.

## 2020-03-13 DIAGNOSIS — E119 Type 2 diabetes mellitus without complications: Secondary | ICD-10-CM | POA: Diagnosis not present

## 2020-03-13 DIAGNOSIS — Z23 Encounter for immunization: Secondary | ICD-10-CM | POA: Diagnosis not present

## 2020-03-13 DIAGNOSIS — Z79899 Other long term (current) drug therapy: Secondary | ICD-10-CM | POA: Diagnosis not present

## 2020-03-13 DIAGNOSIS — M542 Cervicalgia: Secondary | ICD-10-CM | POA: Diagnosis not present

## 2020-03-13 DIAGNOSIS — Z794 Long term (current) use of insulin: Secondary | ICD-10-CM | POA: Diagnosis not present

## 2020-03-13 NOTE — Telephone Encounter (Signed)
Left detailed message giving verbal orders for below.  

## 2020-03-13 NOTE — Telephone Encounter (Signed)
Fine

## 2020-03-16 DIAGNOSIS — A4151 Sepsis due to Escherichia coli [E. coli]: Secondary | ICD-10-CM | POA: Diagnosis not present

## 2020-03-16 DIAGNOSIS — Z8701 Personal history of pneumonia (recurrent): Secondary | ICD-10-CM | POA: Diagnosis not present

## 2020-03-16 DIAGNOSIS — M797 Fibromyalgia: Secondary | ICD-10-CM | POA: Diagnosis not present

## 2020-03-16 DIAGNOSIS — M810 Age-related osteoporosis without current pathological fracture: Secondary | ICD-10-CM | POA: Diagnosis not present

## 2020-03-16 DIAGNOSIS — F32A Depression, unspecified: Secondary | ICD-10-CM | POA: Diagnosis not present

## 2020-03-16 DIAGNOSIS — M353 Polymyalgia rheumatica: Secondary | ICD-10-CM | POA: Diagnosis not present

## 2020-03-16 DIAGNOSIS — E039 Hypothyroidism, unspecified: Secondary | ICD-10-CM | POA: Diagnosis not present

## 2020-03-16 DIAGNOSIS — Z7952 Long term (current) use of systemic steroids: Secondary | ICD-10-CM | POA: Diagnosis not present

## 2020-03-16 DIAGNOSIS — I5033 Acute on chronic diastolic (congestive) heart failure: Secondary | ICD-10-CM | POA: Diagnosis not present

## 2020-03-16 DIAGNOSIS — F419 Anxiety disorder, unspecified: Secondary | ICD-10-CM | POA: Diagnosis not present

## 2020-03-16 DIAGNOSIS — Z794 Long term (current) use of insulin: Secondary | ICD-10-CM | POA: Diagnosis not present

## 2020-03-16 DIAGNOSIS — G8929 Other chronic pain: Secondary | ICD-10-CM | POA: Diagnosis not present

## 2020-03-16 DIAGNOSIS — M1990 Unspecified osteoarthritis, unspecified site: Secondary | ICD-10-CM | POA: Diagnosis not present

## 2020-03-16 DIAGNOSIS — M25511 Pain in right shoulder: Secondary | ICD-10-CM | POA: Diagnosis not present

## 2020-03-16 DIAGNOSIS — J449 Chronic obstructive pulmonary disease, unspecified: Secondary | ICD-10-CM | POA: Diagnosis not present

## 2020-03-16 DIAGNOSIS — Z87440 Personal history of urinary (tract) infections: Secondary | ICD-10-CM | POA: Diagnosis not present

## 2020-03-16 DIAGNOSIS — Z9981 Dependence on supplemental oxygen: Secondary | ICD-10-CM | POA: Diagnosis not present

## 2020-03-16 DIAGNOSIS — E1165 Type 2 diabetes mellitus with hyperglycemia: Secondary | ICD-10-CM | POA: Diagnosis not present

## 2020-03-16 DIAGNOSIS — I110 Hypertensive heart disease with heart failure: Secondary | ICD-10-CM | POA: Diagnosis not present

## 2020-03-20 DIAGNOSIS — G8929 Other chronic pain: Secondary | ICD-10-CM | POA: Diagnosis not present

## 2020-03-20 DIAGNOSIS — M25511 Pain in right shoulder: Secondary | ICD-10-CM | POA: Diagnosis not present

## 2020-03-20 DIAGNOSIS — E1165 Type 2 diabetes mellitus with hyperglycemia: Secondary | ICD-10-CM | POA: Diagnosis not present

## 2020-03-20 DIAGNOSIS — J449 Chronic obstructive pulmonary disease, unspecified: Secondary | ICD-10-CM | POA: Diagnosis not present

## 2020-03-20 DIAGNOSIS — I110 Hypertensive heart disease with heart failure: Secondary | ICD-10-CM | POA: Diagnosis not present

## 2020-03-20 DIAGNOSIS — I5033 Acute on chronic diastolic (congestive) heart failure: Secondary | ICD-10-CM | POA: Diagnosis not present

## 2020-03-22 ENCOUNTER — Other Ambulatory Visit: Payer: Self-pay | Admitting: Internal Medicine

## 2020-03-22 ENCOUNTER — Other Ambulatory Visit: Payer: Self-pay | Admitting: Family

## 2020-03-23 ENCOUNTER — Other Ambulatory Visit: Payer: Self-pay | Admitting: Family

## 2020-03-24 NOTE — Telephone Encounter (Signed)
Will forward to PCP to determine if med should be refilled.   OV on 02/05/20, cardiologist d/c'd for hypotension.  Last OV with PCP 02/18/20 BP 134/72; last OV with pulmonology 02/28/20 BP 118/74

## 2020-03-27 DIAGNOSIS — H02822 Cysts of right lower eyelid: Secondary | ICD-10-CM | POA: Diagnosis not present

## 2020-03-27 NOTE — Telephone Encounter (Signed)
   [name] calling to report patient missed visit

## 2020-03-28 DIAGNOSIS — J449 Chronic obstructive pulmonary disease, unspecified: Secondary | ICD-10-CM | POA: Diagnosis not present

## 2020-03-28 DIAGNOSIS — E1165 Type 2 diabetes mellitus with hyperglycemia: Secondary | ICD-10-CM | POA: Diagnosis not present

## 2020-03-28 DIAGNOSIS — I110 Hypertensive heart disease with heart failure: Secondary | ICD-10-CM | POA: Diagnosis not present

## 2020-03-28 DIAGNOSIS — I5033 Acute on chronic diastolic (congestive) heart failure: Secondary | ICD-10-CM | POA: Diagnosis not present

## 2020-03-28 DIAGNOSIS — G8929 Other chronic pain: Secondary | ICD-10-CM | POA: Diagnosis not present

## 2020-03-28 DIAGNOSIS — M25511 Pain in right shoulder: Secondary | ICD-10-CM | POA: Diagnosis not present

## 2020-04-01 DIAGNOSIS — G8929 Other chronic pain: Secondary | ICD-10-CM | POA: Diagnosis not present

## 2020-04-01 DIAGNOSIS — M25511 Pain in right shoulder: Secondary | ICD-10-CM | POA: Diagnosis not present

## 2020-04-01 DIAGNOSIS — I110 Hypertensive heart disease with heart failure: Secondary | ICD-10-CM | POA: Diagnosis not present

## 2020-04-01 DIAGNOSIS — I5033 Acute on chronic diastolic (congestive) heart failure: Secondary | ICD-10-CM | POA: Diagnosis not present

## 2020-04-01 DIAGNOSIS — E1165 Type 2 diabetes mellitus with hyperglycemia: Secondary | ICD-10-CM | POA: Diagnosis not present

## 2020-04-01 DIAGNOSIS — J449 Chronic obstructive pulmonary disease, unspecified: Secondary | ICD-10-CM | POA: Diagnosis not present

## 2020-04-03 DIAGNOSIS — E1165 Type 2 diabetes mellitus with hyperglycemia: Secondary | ICD-10-CM | POA: Diagnosis not present

## 2020-04-03 DIAGNOSIS — G8929 Other chronic pain: Secondary | ICD-10-CM | POA: Diagnosis not present

## 2020-04-03 DIAGNOSIS — J449 Chronic obstructive pulmonary disease, unspecified: Secondary | ICD-10-CM | POA: Diagnosis not present

## 2020-04-03 DIAGNOSIS — M25511 Pain in right shoulder: Secondary | ICD-10-CM | POA: Diagnosis not present

## 2020-04-03 DIAGNOSIS — I5033 Acute on chronic diastolic (congestive) heart failure: Secondary | ICD-10-CM | POA: Diagnosis not present

## 2020-04-03 DIAGNOSIS — I110 Hypertensive heart disease with heart failure: Secondary | ICD-10-CM | POA: Diagnosis not present

## 2020-04-04 DIAGNOSIS — N3000 Acute cystitis without hematuria: Secondary | ICD-10-CM | POA: Diagnosis not present

## 2020-04-05 ENCOUNTER — Encounter: Payer: Self-pay | Admitting: Internal Medicine

## 2020-04-05 NOTE — Assessment & Plan Note (Signed)
Emphasize continuing reflux precautions. Suspect etiologically related to her bronchiectasis.

## 2020-04-05 NOTE — Assessment & Plan Note (Signed)
Manage as acute exacerbation  Plan- ZPAK

## 2020-04-08 ENCOUNTER — Ambulatory Visit: Payer: Medicare Other | Admitting: Podiatry

## 2020-04-09 ENCOUNTER — Telehealth: Payer: Self-pay | Admitting: Internal Medicine

## 2020-04-09 ENCOUNTER — Other Ambulatory Visit: Payer: Self-pay

## 2020-04-09 DIAGNOSIS — I739 Peripheral vascular disease, unspecified: Secondary | ICD-10-CM

## 2020-04-09 NOTE — Telephone Encounter (Signed)
Ok to give verbal orders? Please advise  

## 2020-04-09 NOTE — Telephone Encounter (Signed)
Liji a physcial therapist from [facility] calling, states that they are not doing therapy this week due to weather, wondering if she can get verbal orders for starting 01.24.22 2 times a week for 2 weeks and 1 time a week for 2 weeks. Liji- [phone] Okay to LVM

## 2020-04-10 NOTE — Telephone Encounter (Signed)
Spoke with Liji from [facility] to give verbal orders. No other questions or concerns at this time.  

## 2020-04-10 NOTE — Telephone Encounter (Signed)
Fine

## 2020-04-14 DIAGNOSIS — J449 Chronic obstructive pulmonary disease, unspecified: Secondary | ICD-10-CM | POA: Diagnosis not present

## 2020-04-14 DIAGNOSIS — M25511 Pain in right shoulder: Secondary | ICD-10-CM | POA: Diagnosis not present

## 2020-04-14 DIAGNOSIS — G8929 Other chronic pain: Secondary | ICD-10-CM | POA: Diagnosis not present

## 2020-04-14 DIAGNOSIS — E1165 Type 2 diabetes mellitus with hyperglycemia: Secondary | ICD-10-CM | POA: Diagnosis not present

## 2020-04-14 DIAGNOSIS — I5033 Acute on chronic diastolic (congestive) heart failure: Secondary | ICD-10-CM | POA: Diagnosis not present

## 2020-04-14 DIAGNOSIS — I110 Hypertensive heart disease with heart failure: Secondary | ICD-10-CM | POA: Diagnosis not present

## 2020-04-15 DIAGNOSIS — F419 Anxiety disorder, unspecified: Secondary | ICD-10-CM | POA: Diagnosis not present

## 2020-04-15 DIAGNOSIS — Z9981 Dependence on supplemental oxygen: Secondary | ICD-10-CM | POA: Diagnosis not present

## 2020-04-15 DIAGNOSIS — I110 Hypertensive heart disease with heart failure: Secondary | ICD-10-CM | POA: Diagnosis not present

## 2020-04-15 DIAGNOSIS — Z79899 Other long term (current) drug therapy: Secondary | ICD-10-CM | POA: Diagnosis not present

## 2020-04-15 DIAGNOSIS — Z794 Long term (current) use of insulin: Secondary | ICD-10-CM | POA: Diagnosis not present

## 2020-04-15 DIAGNOSIS — Z7952 Long term (current) use of systemic steroids: Secondary | ICD-10-CM | POA: Diagnosis not present

## 2020-04-15 DIAGNOSIS — M353 Polymyalgia rheumatica: Secondary | ICD-10-CM | POA: Diagnosis not present

## 2020-04-15 DIAGNOSIS — J449 Chronic obstructive pulmonary disease, unspecified: Secondary | ICD-10-CM | POA: Diagnosis not present

## 2020-04-15 DIAGNOSIS — M25511 Pain in right shoulder: Secondary | ICD-10-CM | POA: Diagnosis not present

## 2020-04-15 DIAGNOSIS — Z87440 Personal history of urinary (tract) infections: Secondary | ICD-10-CM | POA: Diagnosis not present

## 2020-04-15 DIAGNOSIS — M810 Age-related osteoporosis without current pathological fracture: Secondary | ICD-10-CM | POA: Diagnosis not present

## 2020-04-15 DIAGNOSIS — G8929 Other chronic pain: Secondary | ICD-10-CM | POA: Diagnosis not present

## 2020-04-15 DIAGNOSIS — I5033 Acute on chronic diastolic (congestive) heart failure: Secondary | ICD-10-CM | POA: Diagnosis not present

## 2020-04-15 DIAGNOSIS — E1165 Type 2 diabetes mellitus with hyperglycemia: Secondary | ICD-10-CM | POA: Diagnosis not present

## 2020-04-15 DIAGNOSIS — M1990 Unspecified osteoarthritis, unspecified site: Secondary | ICD-10-CM | POA: Diagnosis not present

## 2020-04-15 DIAGNOSIS — E039 Hypothyroidism, unspecified: Secondary | ICD-10-CM | POA: Diagnosis not present

## 2020-04-15 DIAGNOSIS — M797 Fibromyalgia: Secondary | ICD-10-CM | POA: Diagnosis not present

## 2020-04-15 DIAGNOSIS — F32A Depression, unspecified: Secondary | ICD-10-CM | POA: Diagnosis not present

## 2020-04-15 DIAGNOSIS — M542 Cervicalgia: Secondary | ICD-10-CM | POA: Diagnosis not present

## 2020-04-15 DIAGNOSIS — Z8701 Personal history of pneumonia (recurrent): Secondary | ICD-10-CM | POA: Diagnosis not present

## 2020-04-15 DIAGNOSIS — E119 Type 2 diabetes mellitus without complications: Secondary | ICD-10-CM | POA: Diagnosis not present

## 2020-04-16 DIAGNOSIS — M25511 Pain in right shoulder: Secondary | ICD-10-CM | POA: Diagnosis not present

## 2020-04-16 DIAGNOSIS — J449 Chronic obstructive pulmonary disease, unspecified: Secondary | ICD-10-CM | POA: Diagnosis not present

## 2020-04-16 DIAGNOSIS — I5033 Acute on chronic diastolic (congestive) heart failure: Secondary | ICD-10-CM | POA: Diagnosis not present

## 2020-04-16 DIAGNOSIS — I110 Hypertensive heart disease with heart failure: Secondary | ICD-10-CM | POA: Diagnosis not present

## 2020-04-16 DIAGNOSIS — E1165 Type 2 diabetes mellitus with hyperglycemia: Secondary | ICD-10-CM | POA: Diagnosis not present

## 2020-04-16 DIAGNOSIS — G8929 Other chronic pain: Secondary | ICD-10-CM | POA: Diagnosis not present

## 2020-04-22 ENCOUNTER — Ambulatory Visit (INDEPENDENT_AMBULATORY_CARE_PROVIDER_SITE_OTHER): Payer: Medicare Other | Admitting: Podiatry

## 2020-04-22 ENCOUNTER — Other Ambulatory Visit: Payer: Self-pay

## 2020-04-22 DIAGNOSIS — B351 Tinea unguium: Secondary | ICD-10-CM | POA: Diagnosis not present

## 2020-04-22 DIAGNOSIS — G8929 Other chronic pain: Secondary | ICD-10-CM | POA: Diagnosis not present

## 2020-04-22 DIAGNOSIS — M79674 Pain in right toe(s): Secondary | ICD-10-CM | POA: Diagnosis not present

## 2020-04-22 DIAGNOSIS — M25511 Pain in right shoulder: Secondary | ICD-10-CM | POA: Diagnosis not present

## 2020-04-22 DIAGNOSIS — J449 Chronic obstructive pulmonary disease, unspecified: Secondary | ICD-10-CM | POA: Diagnosis not present

## 2020-04-22 DIAGNOSIS — E1149 Type 2 diabetes mellitus with other diabetic neurological complication: Secondary | ICD-10-CM

## 2020-04-22 DIAGNOSIS — E1165 Type 2 diabetes mellitus with hyperglycemia: Secondary | ICD-10-CM | POA: Diagnosis not present

## 2020-04-22 DIAGNOSIS — I110 Hypertensive heart disease with heart failure: Secondary | ICD-10-CM | POA: Diagnosis not present

## 2020-04-22 DIAGNOSIS — L84 Corns and callosities: Secondary | ICD-10-CM

## 2020-04-22 DIAGNOSIS — M79675 Pain in left toe(s): Secondary | ICD-10-CM

## 2020-04-22 DIAGNOSIS — I5033 Acute on chronic diastolic (congestive) heart failure: Secondary | ICD-10-CM | POA: Diagnosis not present

## 2020-04-24 DIAGNOSIS — M25511 Pain in right shoulder: Secondary | ICD-10-CM | POA: Diagnosis not present

## 2020-04-24 DIAGNOSIS — I110 Hypertensive heart disease with heart failure: Secondary | ICD-10-CM | POA: Diagnosis not present

## 2020-04-24 DIAGNOSIS — I5033 Acute on chronic diastolic (congestive) heart failure: Secondary | ICD-10-CM | POA: Diagnosis not present

## 2020-04-24 DIAGNOSIS — G8929 Other chronic pain: Secondary | ICD-10-CM | POA: Diagnosis not present

## 2020-04-24 DIAGNOSIS — J449 Chronic obstructive pulmonary disease, unspecified: Secondary | ICD-10-CM | POA: Diagnosis not present

## 2020-04-24 DIAGNOSIS — E1165 Type 2 diabetes mellitus with hyperglycemia: Secondary | ICD-10-CM | POA: Diagnosis not present

## 2020-04-28 ENCOUNTER — Telehealth: Payer: Self-pay | Admitting: Internal Medicine

## 2020-04-28 DIAGNOSIS — E1165 Type 2 diabetes mellitus with hyperglycemia: Secondary | ICD-10-CM | POA: Diagnosis not present

## 2020-04-28 DIAGNOSIS — J449 Chronic obstructive pulmonary disease, unspecified: Secondary | ICD-10-CM | POA: Diagnosis not present

## 2020-04-28 DIAGNOSIS — G8929 Other chronic pain: Secondary | ICD-10-CM | POA: Diagnosis not present

## 2020-04-28 DIAGNOSIS — I5033 Acute on chronic diastolic (congestive) heart failure: Secondary | ICD-10-CM | POA: Diagnosis not present

## 2020-04-28 DIAGNOSIS — I110 Hypertensive heart disease with heart failure: Secondary | ICD-10-CM | POA: Diagnosis not present

## 2020-04-28 DIAGNOSIS — M25511 Pain in right shoulder: Secondary | ICD-10-CM | POA: Diagnosis not present

## 2020-04-28 NOTE — Telephone Encounter (Signed)
Liji a physical therapist from [facility] calling requesting verbal orders. Requesting to extend physical therapy for 1 week 1, 2 week 1, and 1 week 1 with the possibility of extending after reassess.  Liji- [phone] Ok to lvm

## 2020-04-29 ENCOUNTER — Encounter (HOSPITAL_COMMUNITY): Payer: Medicare Other

## 2020-04-29 ENCOUNTER — Encounter: Payer: Medicare Other | Admitting: Vascular Surgery

## 2020-04-29 DIAGNOSIS — R8271 Bacteriuria: Secondary | ICD-10-CM | POA: Diagnosis not present

## 2020-04-29 DIAGNOSIS — N390 Urinary tract infection, site not specified: Secondary | ICD-10-CM | POA: Diagnosis not present

## 2020-04-29 DIAGNOSIS — N760 Acute vaginitis: Secondary | ICD-10-CM | POA: Diagnosis not present

## 2020-04-29 NOTE — Telephone Encounter (Signed)
LVM giving Liji verbal orders to extend physical therapy. Office number was provided in case she has additional questions or concerns.

## 2020-04-29 NOTE — Progress Notes (Signed)
Subjective: 85 year old female presents to the office today for follow-up evaluation of right second toe pre-ulcerative callus as well as thick, elongated toenails that she cannot trim her self. Denies any open lesions and denies any drainage or pus any increase in swelling. Denies any fevers, chills, nausea, vomiting.  No calf pain, chest pain, shortness of breath.  Objective: AAO x3, NAD-presents with daughter DP/PT pulses palpable bilaterally, CRT less than 3 seconds Hammertoe contracture present of the right second toe resulting in a mild hyperkeratotic lesion on the dorsal PIPJ.  Upon debridement there is no underlying ulceration drainage or any signs of infection.  Mild chronic edema to the toe without any fluctuation crepitation.  There is no malodor Nails are hypertrophic, dystrophic, brittle, discolored, elongated 10. No surrounding redness or drainage. Tenderness nails 1-5 bilaterally. No pain with calf compression, swelling, warmth, erythema  Assessment: Right second toe preulcerative callus; symptomatic onychomycosis  Plan: -All treatment options discussed with the patient including all alternatives, risks, complications.  -Debrided hyperkeratotic tissue with any complications or bleeding.  Continue offloading and monitor closely for any skin breakdown or recurrence of infection. -Debrided the nails x10 without any complications or bleeding.  Return in about 3 months (around 07/20/2020).  Trula Slade DPM

## 2020-04-29 NOTE — Telephone Encounter (Signed)
See below

## 2020-04-29 NOTE — Telephone Encounter (Signed)
Fine

## 2020-04-30 DIAGNOSIS — J449 Chronic obstructive pulmonary disease, unspecified: Secondary | ICD-10-CM | POA: Diagnosis not present

## 2020-04-30 DIAGNOSIS — M25511 Pain in right shoulder: Secondary | ICD-10-CM | POA: Diagnosis not present

## 2020-04-30 DIAGNOSIS — E1165 Type 2 diabetes mellitus with hyperglycemia: Secondary | ICD-10-CM | POA: Diagnosis not present

## 2020-04-30 DIAGNOSIS — G8929 Other chronic pain: Secondary | ICD-10-CM | POA: Diagnosis not present

## 2020-04-30 DIAGNOSIS — I110 Hypertensive heart disease with heart failure: Secondary | ICD-10-CM | POA: Diagnosis not present

## 2020-04-30 DIAGNOSIS — I5033 Acute on chronic diastolic (congestive) heart failure: Secondary | ICD-10-CM | POA: Diagnosis not present

## 2020-05-05 ENCOUNTER — Telehealth: Payer: Self-pay | Admitting: Internal Medicine

## 2020-05-05 ENCOUNTER — Other Ambulatory Visit: Payer: Self-pay | Admitting: Obstetrics and Gynecology

## 2020-05-05 DIAGNOSIS — E1165 Type 2 diabetes mellitus with hyperglycemia: Secondary | ICD-10-CM | POA: Diagnosis not present

## 2020-05-05 DIAGNOSIS — G8929 Other chronic pain: Secondary | ICD-10-CM | POA: Diagnosis not present

## 2020-05-05 DIAGNOSIS — Z1231 Encounter for screening mammogram for malignant neoplasm of breast: Secondary | ICD-10-CM

## 2020-05-05 DIAGNOSIS — I5033 Acute on chronic diastolic (congestive) heart failure: Secondary | ICD-10-CM | POA: Diagnosis not present

## 2020-05-05 DIAGNOSIS — J449 Chronic obstructive pulmonary disease, unspecified: Secondary | ICD-10-CM | POA: Diagnosis not present

## 2020-05-05 DIAGNOSIS — M25511 Pain in right shoulder: Secondary | ICD-10-CM | POA: Diagnosis not present

## 2020-05-05 DIAGNOSIS — I110 Hypertensive heart disease with heart failure: Secondary | ICD-10-CM | POA: Diagnosis not present

## 2020-05-05 NOTE — Telephone Encounter (Signed)
[facility]  Caller states her mother is on pain pills - oxycodone 10/325 PO QID and is now constipated. She has been taking stool softeners (just started them yesterday) Says chugs mineral oil, and eating fiber foods to try to help. She also has severe hemorrhoids (prep H). Caller not with patient, caller is in [place], [place] in [place], [place] refused to allow me to conference. She did .  Advised to see PCP within 4 Hours  Called patient to schedule but patient hung up the phone.

## 2020-05-07 ENCOUNTER — Emergency Department (HOSPITAL_BASED_OUTPATIENT_CLINIC_OR_DEPARTMENT_OTHER): Payer: Medicare Other

## 2020-05-07 ENCOUNTER — Telehealth: Payer: Self-pay | Admitting: Internal Medicine

## 2020-05-07 ENCOUNTER — Other Ambulatory Visit: Payer: Self-pay

## 2020-05-07 ENCOUNTER — Encounter (HOSPITAL_BASED_OUTPATIENT_CLINIC_OR_DEPARTMENT_OTHER): Payer: Self-pay

## 2020-05-07 ENCOUNTER — Inpatient Hospital Stay (HOSPITAL_BASED_OUTPATIENT_CLINIC_OR_DEPARTMENT_OTHER)
Admission: EM | Admit: 2020-05-07 | Discharge: 2020-05-10 | DRG: 871 | Disposition: A | Discharge status: Home Health | Payer: Medicare Other | Attending: Internal Medicine | Admitting: Internal Medicine

## 2020-05-07 DIAGNOSIS — Z91040 Latex allergy status: Secondary | ICD-10-CM

## 2020-05-07 DIAGNOSIS — F32A Depression, unspecified: Secondary | ICD-10-CM | POA: Diagnosis present

## 2020-05-07 DIAGNOSIS — I110 Hypertensive heart disease with heart failure: Secondary | ICD-10-CM | POA: Diagnosis present

## 2020-05-07 DIAGNOSIS — Z87440 Personal history of urinary (tract) infections: Secondary | ICD-10-CM

## 2020-05-07 DIAGNOSIS — F418 Other specified anxiety disorders: Secondary | ICD-10-CM | POA: Diagnosis present

## 2020-05-07 DIAGNOSIS — Z20822 Contact with and (suspected) exposure to covid-19: Secondary | ICD-10-CM | POA: Diagnosis not present

## 2020-05-07 DIAGNOSIS — R4182 Altered mental status, unspecified: Secondary | ICD-10-CM | POA: Diagnosis not present

## 2020-05-07 DIAGNOSIS — N390 Urinary tract infection, site not specified: Secondary | ICD-10-CM | POA: Diagnosis not present

## 2020-05-07 DIAGNOSIS — B9629 Other Escherichia coli [E. coli] as the cause of diseases classified elsewhere: Secondary | ICD-10-CM | POA: Diagnosis not present

## 2020-05-07 DIAGNOSIS — E039 Hypothyroidism, unspecified: Secondary | ICD-10-CM | POA: Diagnosis present

## 2020-05-07 DIAGNOSIS — G9341 Metabolic encephalopathy: Secondary | ICD-10-CM | POA: Diagnosis present

## 2020-05-07 DIAGNOSIS — E1169 Type 2 diabetes mellitus with other specified complication: Secondary | ICD-10-CM | POA: Diagnosis present

## 2020-05-07 DIAGNOSIS — R410 Disorientation, unspecified: Secondary | ICD-10-CM | POA: Diagnosis not present

## 2020-05-07 DIAGNOSIS — I5032 Chronic diastolic (congestive) heart failure: Secondary | ICD-10-CM | POA: Diagnosis not present

## 2020-05-07 DIAGNOSIS — Z833 Family history of diabetes mellitus: Secondary | ICD-10-CM

## 2020-05-07 DIAGNOSIS — G894 Chronic pain syndrome: Secondary | ICD-10-CM | POA: Diagnosis present

## 2020-05-07 DIAGNOSIS — A4151 Sepsis due to Escherichia coli [E. coli]: Principal | ICD-10-CM | POA: Diagnosis present

## 2020-05-07 DIAGNOSIS — Z8249 Family history of ischemic heart disease and other diseases of the circulatory system: Secondary | ICD-10-CM

## 2020-05-07 DIAGNOSIS — I152 Hypertension secondary to endocrine disorders: Secondary | ICD-10-CM | POA: Diagnosis present

## 2020-05-07 DIAGNOSIS — Z9221 Personal history of antineoplastic chemotherapy: Secondary | ICD-10-CM

## 2020-05-07 DIAGNOSIS — R6520 Severe sepsis without septic shock: Secondary | ICD-10-CM | POA: Diagnosis present

## 2020-05-07 DIAGNOSIS — Z79891 Long term (current) use of opiate analgesic: Secondary | ICD-10-CM

## 2020-05-07 DIAGNOSIS — A419 Sepsis, unspecified organism: Secondary | ICD-10-CM | POA: Diagnosis not present

## 2020-05-07 DIAGNOSIS — E785 Hyperlipidemia, unspecified: Secondary | ICD-10-CM | POA: Diagnosis present

## 2020-05-07 DIAGNOSIS — J449 Chronic obstructive pulmonary disease, unspecified: Secondary | ICD-10-CM | POA: Diagnosis present

## 2020-05-07 DIAGNOSIS — Z8543 Personal history of malignant neoplasm of ovary: Secondary | ICD-10-CM

## 2020-05-07 DIAGNOSIS — K5900 Constipation, unspecified: Secondary | ICD-10-CM | POA: Diagnosis present

## 2020-05-07 DIAGNOSIS — I10 Essential (primary) hypertension: Secondary | ICD-10-CM | POA: Diagnosis not present

## 2020-05-07 DIAGNOSIS — Z66 Do not resuscitate: Secondary | ICD-10-CM | POA: Diagnosis present

## 2020-05-07 DIAGNOSIS — E1165 Type 2 diabetes mellitus with hyperglycemia: Secondary | ICD-10-CM | POA: Diagnosis present

## 2020-05-07 DIAGNOSIS — R079 Chest pain, unspecified: Secondary | ICD-10-CM

## 2020-05-07 DIAGNOSIS — Z79899 Other long term (current) drug therapy: Secondary | ICD-10-CM

## 2020-05-07 DIAGNOSIS — R4781 Slurred speech: Secondary | ICD-10-CM | POA: Diagnosis not present

## 2020-05-07 DIAGNOSIS — M797 Fibromyalgia: Secondary | ICD-10-CM | POA: Diagnosis present

## 2020-05-07 DIAGNOSIS — R531 Weakness: Secondary | ICD-10-CM | POA: Diagnosis present

## 2020-05-07 DIAGNOSIS — K219 Gastro-esophageal reflux disease without esophagitis: Secondary | ICD-10-CM | POA: Diagnosis present

## 2020-05-07 DIAGNOSIS — Z79890 Hormone replacement therapy (postmenopausal): Secondary | ICD-10-CM

## 2020-05-07 DIAGNOSIS — M810 Age-related osteoporosis without current pathological fracture: Secondary | ICD-10-CM | POA: Diagnosis present

## 2020-05-07 DIAGNOSIS — Z7952 Long term (current) use of systemic steroids: Secondary | ICD-10-CM

## 2020-05-07 DIAGNOSIS — Z794 Long term (current) use of insulin: Secondary | ICD-10-CM

## 2020-05-07 DIAGNOSIS — E1159 Type 2 diabetes mellitus with other circulatory complications: Secondary | ICD-10-CM | POA: Diagnosis present

## 2020-05-07 DIAGNOSIS — Z880 Allergy status to penicillin: Secondary | ICD-10-CM

## 2020-05-07 DIAGNOSIS — J45909 Unspecified asthma, uncomplicated: Secondary | ICD-10-CM | POA: Diagnosis present

## 2020-05-07 LAB — URINALYSIS, MICROSCOPIC (REFLEX): WBC, UA: >50 WBC/hpf (ref 0–5)

## 2020-05-07 LAB — CBC WITH DIFFERENTIAL/PLATELET
Abs Immature Granulocytes: 0.11 10*3/uL — ABNORMAL HIGH (ref 0.00–0.07)
Basophils Absolute: 0 10*3/uL (ref 0.0–0.1)
Basophils Relative: 0 %
Eosinophils Absolute: 0.4 10*3/uL (ref 0.0–0.5)
Eosinophils Relative: 3 %
HCT: 33.6 % — ABNORMAL LOW (ref 36.0–46.0)
Hemoglobin: 11 g/dL — ABNORMAL LOW (ref 12.0–15.0)
Immature Granulocytes: 1 %
Lymphocytes Relative: 27 %
Lymphs Abs: 3.8 10*3/uL (ref 0.7–4.0)
MCH: 35.3 pg — ABNORMAL HIGH (ref 26.0–34.0)
MCHC: 32.7 g/dL (ref 30.0–36.0)
MCV: 107.7 fL — ABNORMAL HIGH (ref 80.0–100.0)
Monocytes Absolute: 0.7 10*3/uL (ref 0.1–1.0)
Monocytes Relative: 5 %
Neutro Abs: 9.2 10*3/uL — ABNORMAL HIGH (ref 1.7–7.7)
Neutrophils Relative %: 64 %
Platelets: 351 10*3/uL (ref 150–400)
RBC: 3.12 MIL/uL — ABNORMAL LOW (ref 3.87–5.11)
RDW: 13.3 % (ref 11.5–15.5)
WBC: 14.1 10*3/uL — ABNORMAL HIGH (ref 4.0–10.5)
nRBC: 0 % (ref 0.0–0.2)

## 2020-05-07 LAB — URINALYSIS, ROUTINE W REFLEX MICROSCOPIC
Bilirubin Urine: NEGATIVE
Glucose, UA: NEGATIVE mg/dL
Ketones, ur: NEGATIVE mg/dL
Nitrite: NEGATIVE
Protein, ur: 100 mg/dL — AB
Specific Gravity, Urine: 1.03 (ref 1.005–1.030)
pH: 6 (ref 5.0–8.0)

## 2020-05-07 LAB — SARS CORONAVIRUS 2 (TAT 6-24 HRS): SARS Coronavirus 2: NEGATIVE

## 2020-05-07 LAB — BASIC METABOLIC PANEL
Anion gap: 11 (ref 5–15)
BUN: 20 mg/dL (ref 8–23)
CO2: 20 mmol/L — ABNORMAL LOW (ref 22–32)
Calcium: 8.9 mg/dL (ref 8.9–10.3)
Chloride: 106 mmol/L (ref 98–111)
Creatinine, Ser: 0.92 mg/dL (ref 0.44–1.00)
GFR, Estimated: >60 mL/min (ref >60)
Glucose, Bld: 177 mg/dL — ABNORMAL HIGH (ref 70–99)
Potassium: 4.1 mmol/L (ref 3.5–5.1)
Sodium: 137 mmol/L (ref 135–145)

## 2020-05-07 LAB — RESP PANEL BY RT-PCR (FLU A&B, COVID) ARPGX2
Influenza A by PCR: NEGATIVE
Influenza B by PCR: NEGATIVE
SARS Coronavirus 2 by RT PCR: NEGATIVE

## 2020-05-07 LAB — LIPASE, BLOOD: Lipase: 26 U/L (ref 11–51)

## 2020-05-07 LAB — ACETAMINOPHEN LEVEL: Acetaminophen (Tylenol), Serum: 21 ug/mL (ref 10–30)

## 2020-05-07 LAB — HEPATIC FUNCTION PANEL
ALT: 16 U/L (ref 0–44)
AST: 16 U/L (ref 15–41)
Albumin: 3.2 g/dL — ABNORMAL LOW (ref 3.5–5.0)
Alkaline Phosphatase: 31 U/L — ABNORMAL LOW (ref 38–126)
Bilirubin, Direct: <0.1 mg/dL (ref 0.0–0.2)
Total Bilirubin: 0.4 mg/dL (ref 0.3–1.2)
Total Protein: 6.4 g/dL — ABNORMAL LOW (ref 6.5–8.1)

## 2020-05-07 LAB — CBG MONITORING, ED: Glucose-Capillary: 115 mg/dL — ABNORMAL HIGH (ref 70–99)

## 2020-05-07 LAB — TSH: TSH: 0.681 u[IU]/mL (ref 0.350–4.500)

## 2020-05-07 LAB — TROPONIN I (HIGH SENSITIVITY): Troponin I (High Sensitivity): 8 ng/L (ref <18)

## 2020-05-07 LAB — LACTIC ACID, PLASMA: Lactic Acid, Venous: 1.6 mmol/L (ref 0.5–1.9)

## 2020-05-07 MED ORDER — PANTOPRAZOLE SODIUM 40 MG PO TBEC
40.0000 mg | DELAYED_RELEASE_TABLET | Freq: Every day | ORAL | Status: DC
  Administered 2020-05-08 – 2020-05-10 (×3): 40 mg via ORAL
  Filled 2020-05-07 (×3): qty 1

## 2020-05-07 MED ORDER — DULOXETINE HCL 60 MG PO CPEP
60.0000 mg | ORAL_CAPSULE | Freq: Every day | ORAL | Status: DC
Start: 2020-05-08 — End: 2020-05-07

## 2020-05-07 MED ORDER — AMITRIPTYLINE HCL 10 MG PO TABS
10.0000 mg | ORAL_TABLET | Freq: Every day | ORAL | Status: DC
  Administered 2020-05-08 – 2020-05-09 (×2): 10 mg via ORAL
  Filled 2020-05-07 (×3): qty 1

## 2020-05-07 MED ORDER — UMECLIDINIUM BROMIDE 62.5 MCG/INH IN AEPB
1.0000 | INHALATION_SPRAY | Freq: Every day | RESPIRATORY_TRACT | Status: DC
  Administered 2020-05-08 – 2020-05-10 (×3): 1 via RESPIRATORY_TRACT
  Filled 2020-05-07: qty 7

## 2020-05-07 MED ORDER — ENOXAPARIN SODIUM 40 MG/0.4ML ~~LOC~~ SOLN
40.0000 mg | SUBCUTANEOUS | Status: DC
  Administered 2020-05-08 – 2020-05-10 (×3): 40 mg via SUBCUTANEOUS
  Filled 2020-05-07 (×3): qty 0.4

## 2020-05-07 MED ORDER — ENSURE ENLIVE PO LIQD
237.0000 mL | Freq: Two times a day (BID) | ORAL | Status: DC
  Administered 2020-05-08 – 2020-05-09 (×3): 237 mL via ORAL

## 2020-05-07 MED ORDER — ACETAMINOPHEN 325 MG PO TABS
650.0000 mg | ORAL_TABLET | Freq: Four times a day (QID) | ORAL | Status: DC | PRN
  Administered 2020-05-09: 650 mg via ORAL
  Filled 2020-05-07 (×2): qty 2

## 2020-05-07 MED ORDER — DULOXETINE HCL 30 MG PO CPEP: 30.0000 mg | ORAL_CAPSULE | Freq: Every day | ORAL | Status: DC

## 2020-05-07 MED ORDER — ALBUTEROL SULFATE HFA 108 (90 BASE) MCG/ACT IN AERS: 2.0000 | INHALATION_SPRAY | Freq: Four times a day (QID) | RESPIRATORY_TRACT | Status: DC | PRN

## 2020-05-07 MED ORDER — LEVOTHYROXINE SODIUM 50 MCG PO TABS
50.0000 ug | ORAL_TABLET | Freq: Every day | ORAL | Status: DC
  Administered 2020-05-08 – 2020-05-10 (×3): 50 ug via ORAL
  Filled 2020-05-07 (×3): qty 1

## 2020-05-07 MED ORDER — ACETAMINOPHEN 650 MG RE SUPP: 650.0000 mg | Freq: Four times a day (QID) | RECTAL | Status: DC | PRN

## 2020-05-07 MED ORDER — ONDANSETRON HCL 4 MG PO TABS: 4.0000 mg | ORAL_TABLET | Freq: Four times a day (QID) | ORAL | Status: DC | PRN

## 2020-05-07 MED ORDER — ONDANSETRON HCL 4 MG/2ML IJ SOLN: 4.0000 mg | Freq: Four times a day (QID) | INTRAMUSCULAR | Status: DC | PRN

## 2020-05-07 MED ORDER — DULOXETINE HCL 60 MG PO CPEP
90.0000 mg | ORAL_CAPSULE | Freq: Every day | ORAL | Status: DC
  Administered 2020-05-08 – 2020-05-10 (×3): 90 mg via ORAL
  Filled 2020-05-07 (×3): qty 1

## 2020-05-07 MED ORDER — INSULIN ASPART 100 UNIT/ML ~~LOC~~ SOLN
0.0000 [IU] | Freq: Three times a day (TID) | SUBCUTANEOUS | Status: DC
  Administered 2020-05-08: 3 [IU] via SUBCUTANEOUS

## 2020-05-07 MED ORDER — LACTATED RINGERS IV BOLUS
1000.0000 mL | Freq: Once | INTRAVENOUS | Status: AC
  Administered 2020-05-07: 1000 mL via INTRAVENOUS

## 2020-05-07 MED ORDER — MOMETASONE FURO-FORMOTEROL FUM 100-5 MCG/ACT IN AERO
2.0000 | INHALATION_SPRAY | Freq: Two times a day (BID) | RESPIRATORY_TRACT | Status: DC
  Administered 2020-05-08 – 2020-05-10 (×5): 2 via RESPIRATORY_TRACT
  Filled 2020-05-07: qty 8.8

## 2020-05-07 MED ORDER — SODIUM CHLORIDE 0.9 % IV SOLN
1.0000 g | Freq: Two times a day (BID) | INTRAVENOUS | Status: DC
  Administered 2020-05-07 – 2020-05-08 (×3): 1 g via INTRAVENOUS
  Filled 2020-05-07 (×4): qty 1

## 2020-05-07 MED ORDER — BUSPIRONE HCL 5 MG PO TABS
5.0000 mg | ORAL_TABLET | Freq: Two times a day (BID) | ORAL | Status: DC
Start: 2020-05-07 — End: 2020-05-10
  Administered 2020-05-08 – 2020-05-10 (×5): 5 mg via ORAL
  Filled 2020-05-07 (×6): qty 1

## 2020-05-07 MED ORDER — ROSUVASTATIN CALCIUM 10 MG PO TABS
10.0000 mg | ORAL_TABLET | Freq: Every evening | ORAL | Status: DC
  Administered 2020-05-08 – 2020-05-09 (×2): 10 mg via ORAL
  Filled 2020-05-07 (×3): qty 1

## 2020-05-07 MED ORDER — HYDROCODONE-ACETAMINOPHEN 5-325 MG PO TABS
2.0000 | ORAL_TABLET | Freq: Four times a day (QID) | ORAL | Status: DC | PRN
  Administered 2020-05-08 – 2020-05-09 (×5): 2 via ORAL
  Filled 2020-05-07 (×6): qty 2

## 2020-05-07 MED ORDER — HYDROCODONE-ACETAMINOPHEN 10-325 MG PO TABS
1.0000 | ORAL_TABLET | ORAL | Status: DC | PRN
  Filled 2020-05-07: qty 1

## 2020-05-07 MED ORDER — FLUTICASONE-UMECLIDIN-VILANT 100-62.5-25 MCG/INH IN AEPB: 1.0000 | INHALATION_SPRAY | Freq: Every day | RESPIRATORY_TRACT | Status: DC

## 2020-05-07 MED ORDER — HYDROCODONE-ACETAMINOPHEN 5-325 MG PO TABS: 2.0000 | ORAL_TABLET | Freq: Four times a day (QID) | ORAL | Status: DC | PRN

## 2020-05-07 MED ORDER — MONTELUKAST SODIUM 10 MG PO TABS
10.0000 mg | ORAL_TABLET | Freq: Every day | ORAL | Status: DC
  Administered 2020-05-08 – 2020-05-10 (×3): 10 mg via ORAL
  Filled 2020-05-07 (×4): qty 1

## 2020-05-07 MED ORDER — INSULIN GLARGINE 100 UNIT/ML ~~LOC~~ SOLN
15.0000 [IU] | Freq: Every day | SUBCUTANEOUS | Status: DC
  Administered 2020-05-08 – 2020-05-10 (×3): 15 [IU] via SUBCUTANEOUS
  Filled 2020-05-07 (×4): qty 0.15

## 2020-05-07 MED ORDER — UMECLIDINIUM BROMIDE 62.5 MCG/INH IN AEPB
1.0000 | INHALATION_SPRAY | Freq: Every day | RESPIRATORY_TRACT | Status: DC
  Filled 2020-05-07: qty 7

## 2020-05-07 MED ORDER — MOMETASONE FURO-FORMOTEROL FUM 100-5 MCG/ACT IN AERO
2.0000 | INHALATION_SPRAY | Freq: Two times a day (BID) | RESPIRATORY_TRACT | Status: DC
  Filled 2020-05-07: qty 8.8

## 2020-05-07 NOTE — Progress Notes (Signed)
Patient accepted to [facility]. [facility] hospitalists to assume care when patient arrives at accepting facility.

## 2020-05-07 NOTE — ED Provider Notes (Signed)
[facility] EMERGENCY DEPARTMENT Provider Note   CSN: [id number] Arrival date & time: 05/07/20  1411     History Chief Complaint  Patient presents with  . Memory Loss    Kayla Thompson is a 85 y.o. female.  85yo F w/ PMH below including IDDM, COPD, HTN, hypothyroidism, ovarian cancer, fibromyalgia who presents with confusion.  Granddaughter reports that the patient has had 2 weeks of progressively worsening confusion, intermittent slurred speech, and not acting as sharp as she normally is.  Granddaughter notes that 5 months ago, she became very ill with a urinary tract infection complicated by sepsis and had delirium in the hospital.  She had memory problems afterwards but her memory seemed to be improving up until 2 weeks ago.  She does not have a history of dementia prior to hospitalization 5 months ago.  Patient herself has not had any complaints.  She did mention some chest pain and shortness of breath yesterday which she attributed to acid reflux.  She has not complained of it today and patient denies any of these symptoms to me.  She has chronically poor appetite but no major changes in appetite recently.  No vomiting or diarrhea, has had intermittent problems with constipation.  She has not complained of any urinary symptoms.  Patient denies any abdominal pain.  No cough/cold symptoms, fevers, or recent illness.  The only medication she has changed recently is she has increased her Lortab to 4 pills in a 24-hour period as opposed to two.  Granddaughter did count her pills to make sure that she had not taken too many of them.  No new medications.  LEVEL 5 CAVEAT DUE TO AMS  The history is provided by the patient.       Past Medical History:  Diagnosis Date  . Anxiety   . Arthritis   . Asthma   . Cataracts, bilateral    removed by surgery  . COPD (chronic obstructive pulmonary disease) ([place])   . Depression   . Diabetes mellitus without complication ([place])    type 2   . Dyspnea    with exertion  . Fibromyalgia   . GERD (gastroesophageal reflux disease)   . History of chemotherapy   . History of fractured pelvis   . Hypertension   . Hypothyroidism   . Osteoporosis 12/2017   T score -2.8 stable from prior DEXA  . Osteoporosis   . Ovarian cancer ([place])   . Pneumonia    several times    Patient Active Problem List   Diagnosis Date Noted  . UTI (urinary tract infection) 05/07/2020  . Acute pain of both knees 02/20/2020  . PAD (peripheral artery disease) ([place]) 02/05/2020  . Sepsis ([place]) 01/12/2020  . Lower extremity edema 11/20/2019  . Pre-operative cardiovascular examination 10/18/2019  . UTI symptoms 05/29/2019  . Dysuria 05/11/2019  . Upper esophageal web 03/27/2019  . Iron deficiency anemia 03/27/2019  . Candida esophagitis ([place]) 02/14/2019  . Encounter for general adult medical examination with abnormal findings 01/09/2019  . Abnormal barium swallow 12/28/2018  . Throat clearing 12/28/2018  . History of colonic polyps 12/28/2018  . Dysphagia 12/28/2018  . Diabetic foot ulcer ([place]) 12/05/2018  . Rotator cuff arthropathy, left 11/16/2018  . Degenerative arthritis of left knee 11/16/2018  . Insomnia 09/26/2018  . Leukocytosis 10/17/2017  . Severe protein-calorie malnutrition ([place]) 10/10/2017  . Memory changes 07/29/2017  . Granulomatous lung disease ([place]) 06/14/2017  . Tracheobronchomalacia 06/14/2017  . Dizziness 03/04/2017  .  Exercise hypoxemia 07/12/2016  . Diastolic CHF, chronic ([place]) 12/25/2015  . GERD (gastroesophageal reflux disease) 12/25/2015  . Epidermal inclusion cyst 09/02/2015  . Type 2 diabetes mellitus with hyperglycemia, with long-term current use of insulin ([place]) 03/10/2015  . Muscle cramps 03/06/2015  . Cough 01/07/2015  . Bronchiectasis without acute exacerbation ([place]) 01/07/2015  . PMR (polymyalgia rheumatica) ([place]) 01/07/2015  . Osteoarthritis 01/07/2015  . Asthma, chronic 11/12/2014  . Anxiety state  11/12/2014  . Hyperparathyroidism ([place]) 10/28/2014  . Fibromyalgia 09/21/2014  . Personal history of ovarian cancer 08/23/2014  . Osteoporosis 08/23/2014  . Diabetes mellitus with coincident hypertension ([place]) 08/02/2014  . HLD (hyperlipidemia) 08/02/2014  . Hypothyroidism 08/02/2014    Past Surgical History:  Procedure Laterality Date  . ABDOMINAL HYSTERECTOMY  1996  . ANKLE FRACTURE SURGERY     plate and screws  . BIOPSY  02/05/2019   Procedure: BIOPSY;  Surgeon: Rush Landmark Telford Nab., MD;  Location: [facility];  Service: Gastroenterology;;  . BLADDER SUSPENSION    . CATARACT EXTRACTION    . COLONOSCOPY    . ESOPHAGEAL BRUSHING  02/05/2019   Procedure: ESOPHAGEAL BRUSHING;  Surgeon: Rush Landmark Telford Nab., MD;  Location: [facility];  Service: Gastroenterology;;  . ESOPHAGOGASTRODUODENOSCOPY (EGD) WITH PROPOFOL N/A 02/05/2019   Procedure: ESOPHAGOGASTRODUODENOSCOPY (EGD) WITH PROPOFOL with dialtion;  Surgeon: Irving Copas., MD;  Location: [facility];  Service: Gastroenterology;  Laterality: N/A;  . EYE SURGERY     cataracts removed-bilateral  . hip surgey    . knee surgey    . LUNG SURGERY    . OOPHORECTOMY     BSO  . SAVORY DILATION N/A 02/05/2019   Procedure: SAVORY DILATION;  Surgeon: Rush Landmark Telford Nab., MD;  Location: [facility];  Service: Gastroenterology;  Laterality: N/A;  . TONSILLECTOMY    . UPPER GI ENDOSCOPY       OB History    Gravida  1   Para  1   Term      Preterm      AB      Living  1     SAB      IAB      Ectopic      Multiple      Live Births              Family History  Problem Relation Age of Onset  . Lung cancer Mother   . CAD Father   . Diabetes Father   . Lung cancer Maternal Aunt   . Diabetes Paternal Aunt   . Diabetes Paternal Uncle   . Diabetes Paternal Grandmother   . Diabetes Paternal Grandfather   . Colon cancer Neg Hx   . Esophageal cancer Neg Hx   . Inflammatory bowel disease Neg  Hx   . Liver disease Neg Hx   . Pancreatic cancer Neg Hx   . Rectal cancer Neg Hx   . Stomach cancer Neg Hx     Social History   Tobacco Use  . Smoking status: Never Smoker  . Smokeless tobacco: Never Used  Vaping Use  . Vaping Use: Never used  Substance Use Topics  . Alcohol use: No    Alcohol/week: 0.0 standard drinks  . Drug use: No    Home Medications Prior to Admission medications   Medication Sig Start Date End Date Taking? Authorizing Provider  albuterol (PROAIR HFA) 108 (90 Base) MCG/ACT inhaler Inhale 2 puffs into the lungs every 6 (six) hours as needed for wheezing or  shortness of breath. 08/27/19  Yes Young, Tarri Fuller D, MD  amitriptyline (ELAVIL) 10 MG tablet TAKE 1 TABLET BY MOUTH EVERYDAY AT BEDTIME 02/26/20  Yes [name], MD  baclofen (LIORESAL) 10 MG tablet TAKE 0.5-1 TABLET BY MOUTH TWICE DAILY AS NEEDED FOR MUSCLE SPASMS OR PAIN 02/27/20  Yes [name], MD  BD PEN NEEDLE NANO 2ND GEN 32G X 4 MM MISC USE AS DIRECTED 4 TIMES A DAY 10/30/19  Yes [name], MD  busPIRone (BUSPAR) 5 MG tablet TAKE 1 TO 2 TABLETS BY MOUTH TWICE A DAY 03/25/20  Yes [name], MD  clotrimazole-betamethasone (LOTRISONE) cream APPLY TO AFFECTED AREA TWICE A DAY 02/26/20  Yes [name], MD  DULoxetine (CYMBALTA) 30 MG capsule Take 1 capsule (30 mg total) by mouth daily. Plan to see her PCP for further refills 12/26/19  Yes Marrian Salvage, FNP  DULoxetine (CYMBALTA) 60 MG capsule TAKE 1 CAPSULE BY MOUTH EVERY DAY 03/25/20  Yes [name], MD  feeding supplement (ENSURE ENLIVE / ENSURE PLUS) LIQD Take 237 mLs by mouth 2 (two) times daily between meals. 01/16/20  Yes Hongalgi, Lenis Dickinson, MD  Fluticasone-Umeclidin-Vilant (TRELEGY ELLIPTA) 100-62.5-25 MCG/INH AEPB Inhale 1 puff into the lungs daily. Rinse mouth 07/02/19  Yes Young, Clinton D, MD  glucose blood (FREESTYLE LITE) test strip USE TO TEST BLOOD SUGAR 3 TIMES DAILY. DX:  E11.65 03/04/20  Yes Philemon Kingdom, MD  HYDROcodone-acetaminophen (NORCO) 10-325 MG tablet Take 1 tablet by mouth every 4 (four) hours as needed. 11/15/19  Yes [provider]  insulin glargine (LANTUS SOLOSTAR) 100 UNIT/ML Solostar Pen Inject 15 Units into the skin daily. 01/15/20  Yes Hongalgi, Lenis Dickinson, MD  ipratropium-albuterol (DUONEB) 0.5-2.5 (3) MG/3ML SOLN Inhale 3 mLs into the lungs 3 (three) times daily as needed (shortness of breath). USE 1 VIAL IN NEBULIZER 3 TIMES DAILY 01/15/20  Yes Hongalgi, Lenis Dickinson, MD  Lancets (FREESTYLE) lancets Use to test blood sugar 3 times daily. Dx: E11.65 10/14/16  Yes Philemon Kingdom, MD  levothyroxine (SYNTHROID) 50 MCG tablet TAKE 1 TABLET BY MOUTH EVERY DAY BEFORE BREAKFAST 12/13/19  Yes [name], MD  lidocaine (XYLOCAINE) 5 % ointment Apply 1 application topically as needed. Apply to external vulvar area 01/02/20  Yes Joseph Pierini, MD  magnesium oxide (MAG-OX) 400 MG tablet TAKE 1 TABLET BY MOUTH EVERY DAY 02/26/20  Yes [name], MD  montelukast (SINGULAIR) 10 MG tablet TAKE 1 TABLET BY MOUTH EVERY DAY 10/30/19  Yes Young, Tarri Fuller D, MD  omeprazole (PRILOSEC) 20 MG capsule TAKE 1 CAPSULE BY MOUTH 2 TIMES A DAY BEFORE A MEAL 11/27/19  Yes Mansouraty, Telford Nab., MD  predniSONE (DELTASONE) 1 MG tablet Take 2 tablets (2 mg total) by mouth daily. 07/25/18  Yes [name], MD  rosuvastatin (CRESTOR) 10 MG tablet Take 1 tablet (10 mg total) by mouth every evening. 07/02/19  Yes [name], MD  Apoaequorin (PREVAGEN PO) Take 1 tablet by mouth daily.    [provider]  azithromycin (ZITHROMAX) 250 MG tablet 2 today then one daily 02/28/20   Baird Lyons D, MD  calcium carbonate (OSCAL) 1500 (600 Ca) MG TABS tablet Take 600 mg by mouth 2 (two) times daily with a meal.    [provider]  Cholecalciferol (VITAMIN D3) 2000 units TABS Take 2 tablets by mouth daily.     [provider]  Dextromethorphan-Guaifenesin (MUCINEX DM MAXIMUM STRENGTH) 60-1200 MG TB12  Take 1 tablet by mouth 2 (two) times daily. Reported on 06/23/2015    [provider]  ferrous gluconate (FERGON) 324 MG tablet TAKE 1 TABLET BY MOUTH EVERY DAY WITH BREAKFAST 06/27/19   Mansouraty, Telford Nab., MD  fluconazole (DIFLUCAN) 100 MG tablet Take 1 tablet (100 mg total) by mouth daily. 02/08/20   Marrian Salvage, FNP  lidocaine (LIDODERM) 5 % Place 1 patch onto the skin every 12 (twelve) hours as needed. pain 06/12/19   [provider]  methenamine (MANDELAMINE) 1 g tablet Take 1,000 mg by mouth 2 (two) times daily. Cont. 07/06/19   [provider]  Misc Natural Products (TART CHERRY ADVANCED PO) Take by mouth. Once daily    [provider]  Multiple Vitamins-Minerals (CENTRUM ADULTS PO) Take 1 tablet by mouth daily.     [provider]  TART CHERRY PO Take 1,000 mg by mouth at bedtime.    [provider]  UNABLE TO FIND Take 1,000 mg by mouth daily. Med Name: Tumeric    [provider]  vitamin B-12 (CYANOCOBALAMIN) 250 MCG tablet Take 250 mcg by mouth See admin instructions. Twice weekly  ( Monday & Thursday )    [provider]    Allergies    Latex and Penicillins  Review of Systems   Review of Systems  Unable to perform ROS: Mental status change    Physical Exam Updated Vital Signs BP (!) 166/86   Pulse 92   Temp 98.5 F (36.9 C) (Oral)   Resp 18   Wt 64.4 kg   LMP  (LMP Unknown)   SpO2 100%   BMI 28.68 kg/m   Physical Exam Vitals and nursing note reviewed.  Constitutional:      General: She is not in acute distress.    Appearance: She is well-developed.     Comments: Awake, alert  HENT:     Head: Normocephalic and atraumatic.     Mouth/Throat:     Mouth: Mucous membranes are dry.  Eyes:     Extraocular Movements: Extraocular movements intact.     Conjunctiva/sclera: Conjunctivae normal.     Pupils:  Pupils are equal, round, and reactive to light.  Cardiovascular:     Rate and Rhythm: Normal rate and regular rhythm.     Heart sounds: Normal heart sounds. No murmur heard.   Pulmonary:     Effort: Pulmonary effort is normal. No respiratory distress.     Breath sounds: Normal breath sounds.  Abdominal:     General: Bowel sounds are normal. There is no distension.     Palpations: Abdomen is soft.     Tenderness: There is no abdominal tenderness.  Musculoskeletal:     Cervical back: Neck supple.  Skin:    General: Skin is warm and dry.  Neurological:     Mental Status: She is alert and oriented to person, place, and time.     Cranial Nerves: No cranial nerve deficit.     Sensory: No sensory deficit.     Motor: No weakness or abnormal muscle tone.     Deep Tendon Reflexes: Reflexes are normal and symmetric.     Comments: Fluent speech, normal finger-to-nose testing 5/5 strength and normal sensation x all 4 extremities  Psychiatric:        Mood and Affect: Mood normal.     ED Results / Procedures / Treatments   Labs (all labs ordered are listed, but only abnormal results are displayed) Labs  Reviewed  CBC WITH DIFFERENTIAL/PLATELET - Abnormal; Notable for the following components:      Result Value   WBC 14.1 (*)    RBC 3.12 (*)    Hemoglobin 11.0 (*)    HCT 33.6 (*)    MCV 107.7 (*)    MCH 35.3 (*)    Neutro Abs 9.2 (*)    Abs Immature Granulocytes 0.11 (*)    All other components within normal limits  BASIC METABOLIC PANEL - Abnormal; Notable for the following components:   CO2 20 (*)    Glucose, Bld 177 (*)    All other components within normal limits  URINALYSIS, ROUTINE W REFLEX MICROSCOPIC - Abnormal; Notable for the following components:   Color, Urine COLORLESS (*)    APPearance CLOUDY (*)    Hgb urine dipstick MODERATE (*)    Protein, ur 100 (*)    Leukocytes,Ua SMALL (*)    All other components within normal limits  HEPATIC FUNCTION PANEL - Abnormal;  Notable for the following components:   Total Protein 6.4 (*)    Albumin 3.2 (*)    Alkaline Phosphatase 31 (*)    All other components within normal limits  URINALYSIS, MICROSCOPIC (REFLEX) - Abnormal; Notable for the following components:   Bacteria, UA FEW (*)    Non Squamous Epithelial PRESENT (*)    All other components within normal limits  URINE CULTURE  SARS CORONAVIRUS 2 (TAT 6-24 HRS)  CULTURE, BLOOD (ROUTINE X 2)  CULTURE, BLOOD (ROUTINE X 2)  LIPASE, BLOOD  ACETAMINOPHEN LEVEL  LACTIC ACID, PLASMA  TSH  TROPONIN I (HIGH SENSITIVITY)    EKG EKG Interpretation  Date/Time:  Wednesday May 07 2020 16:29:43 EST Ventricular Rate:  91 PR Interval:    QRS Duration: 84 QT Interval:  377 QTC Calculation: 464 R Axis:   59 Text Interpretation: Sinus rhythm Atrial premature complex Borderline short PR interval Abnormal R-wave progression, early transition No significant change since last tracing Confirmed by Theotis Burrow [id number]) on 05/07/2020 5:00:05 PM   Radiology CT Head Wo Contrast  Result Date: 05/07/2020 CLINICAL DATA:  Mental status change. Confusion. Memory loss. Slurred speech for 2 weeks. No reported injury. EXAM: CT HEAD WITHOUT CONTRAST TECHNIQUE: Contiguous axial images were obtained from the base of the skull through the vertex without intravenous contrast. COMPARISON:  01/12/2020 head CT. FINDINGS: Brain: No evidence of parenchymal hemorrhage or extra-axial fluid collection. No mass lesion, mass effect, or midline shift. No CT evidence of acute infarction. Generalized cerebral volume loss. Nonspecific moderate subcortical and periventricular white matter hypodensity, most in keeping with chronic small vessel ischemic change. No ventriculomegaly. Vascular: No acute abnormality. Skull: No evidence of calvarial fracture. Sinuses/Orbits: The visualized paranasal sinuses are essentially clear. Other:  The mastoid air cells are unopacified. IMPRESSION: 1. No evidence  of acute intracranial abnormality. 2. Generalized cerebral volume loss and moderate chronic small vessel ischemic changes in the cerebral white matter. Electronically Signed   By: Ilona Sorrel M.D.   On: 05/07/2020 17:13   DG Chest Port 1 View  Result Date: 05/07/2020 CLINICAL DATA:  Confusion, slurred speech, memory loss for 2 weeks EXAM: PORTABLE CHEST 1 VIEW COMPARISON:  01/25/2020 chest radiograph. FINDINGS: Stable cardiomediastinal silhouette with normal heart size. No pneumothorax. No pleural effusion. Minimal reticular opacities at the right costophrenic angle. No pulmonary edema. No acute consolidative airspace disease. Healed deformity in the lateral left fourth rib. IMPRESSION: Minimal reticular opacities at the right costophrenic angle, favor scarring or atelectasis.  Otherwise no active disease in the chest. Electronically Signed   By: Ilona Sorrel M.D.   On: 05/07/2020 17:14    Procedures .Critical Care Performed by: Sharlett Iles, MD Authorized by: Sharlett Iles, MD   Critical care provider statement:    Critical care time (minutes):  30   Critical care time was exclusive of:  Separately billable procedures and treating other patients   Critical care was necessary to treat or prevent imminent or life-threatening deterioration of the following conditions:  Sepsis   Critical care was time spent personally by me on the following activities:  Development of treatment plan with patient or surrogate, discussions with consultants, evaluation of patient's response to treatment, examination of patient, obtaining history from patient or surrogate, ordering and performing treatments and interventions, ordering and review of laboratory studies, ordering and review of radiographic studies, re-evaluation of patient's condition and review of old charts     Medications Ordered in ED Medications  meropenem (MERREM) 1 g in sodium chloride 0.9 % 100 mL IVPB (0 g Intravenous Stopped  05/07/20 1925)  amitriptyline (ELAVIL) tablet 10 mg (0 mg Oral Hold 05/07/20 2040)  busPIRone (BUSPAR) tablet 5 mg (0 mg Oral Hold 05/07/20 2040)  insulin glargine (LANTUS) injection 15 Units (has no administration in time range)  levothyroxine (SYNTHROID) tablet 50 mcg (has no administration in time range)  montelukast (SINGULAIR) tablet 10 mg (0 mg Oral Hold 05/07/20 2038)  pantoprazole (PROTONIX) EC tablet 40 mg (0 mg Oral Hold 05/07/20 2039)  rosuvastatin (CRESTOR) tablet 10 mg (0 mg Oral Hold 05/07/20 2039)  HYDROcodone-acetaminophen (NORCO/VICODIN) 5-325 MG per tablet 2 tablet (has no administration in time range)  lactated ringers bolus 1,000 mL (0 mLs Intravenous Stopped 05/07/20 1937)    ED Course  I have reviewed the triage vital signs and the nursing notes.  Pertinent labs & imaging results that were available during my care of the patient were reviewed by me and considered in my medical decision making (see chart for details).    MDM Rules/Calculators/A&P                          Patient was alert and nontoxic on exam with reassuring vital signs.  No complaints and no abdominal tenderness.  She had no focal unilateral neurologic deficits to suggest stroke.  Her lab work shows cath'd urinalysis with small leukocytes, greater than 50 WBCs with WBC clumps, and some bacteria.  I suspect this represents infection.  Family states that her recent confusion is very similar to the confusion she had prior to her last hospitalization with sepsis due to UTI.  Rest of her blood work is notable for WBC 14, normal creatinine, normal lactate and troponin.  Chest x-ray with scarring, no obvious infiltrate.  Head CT negative acute.  Chart review shows history of ESBL at previous hospitalization. Discussed w/ pharmacist, recommended meropenem. Blood and urine cultures sent. Discussed admission at [facility] w/ triad hospitalist, Dr. Clearence Ped.  Final Clinical Impression(s) / ED Diagnoses Final  diagnoses:  UTI due to extended-spectrum beta lactamase (ESBL) producing Escherichia coli  Confusion    Rx / DC Orders ED Discharge Orders    None       Camren Henthorn, Wenda Overland, MD 05/07/20 2116

## 2020-05-07 NOTE — Telephone Encounter (Signed)
Patients granddaughter calling, states the patient is experiencing some extreme altered mental status, transferred to team health for further eval

## 2020-05-07 NOTE — ED Triage Notes (Addendum)
Per pt c/o "memory loss"- caregiver reports pt with confusion, slurred speech x 2 weeks-she feels pt is worse today-denies head injury-pt lives independent with 24 hr caregiver-pt c/o pain to bilat arm, left knee-alert to name, DOB, age-disoriented to place, date-pt NAD-to triage in w/c

## 2020-05-07 NOTE — ED Notes (Signed)
ED Provider at bedside. 

## 2020-05-07 NOTE — Telephone Encounter (Signed)
Noted  

## 2020-05-07 NOTE — Progress Notes (Signed)
Pharmacy Antibiotic Note  Kayla Thompson is a 85 y.o. female admitted on 05/07/2020 with AMS with concerns for UTI. Patient has recent history of ESBL Ecoli bacteremia and UTI and was treated with ertapenem in October 2021.  Pharmacy has been consulted for Meropenem dosing. WBC elevated 14.1, Scr 0.92, CrCl 38.5 ml/min.  Plan: Meropenem 1gm IV q12 hrs Monitor renal function, cultures/sensitivites, clinical progression   Weight: 64.4 kg (142 lb)  Temp (24hrs), Avg:98.5 F (36.9 C), Min:98.5 F (36.9 C), Max:98.5 F (36.9 C)  Recent Labs  Lab 05/07/20 1442  WBC 14.1*  CREATININE 0.92    Estimated Creatinine Clearance: 38.5 mL/min (by C-G formula based on SCr of 0.92 mg/dL).    Allergies  Allergen Reactions  . Latex Rash  . Penicillins Rash    Has patient had a PCN reaction causing immediate rash, facial/tongue/throat swelling, SOB or lightheadedness with hypotension: No Has patient had a PCN reaction causing severe rash involving mucus membranes or skin necrosis: No Has patient had a PCN reaction that required hospitalization: No Has patient had a PCN reaction occurring within the last 10 years: No If all of the above answers are "NO", then may proceed with Cephalosporin use.     Antimicrobials this admission: Meropenem 2/16 >>   Dose adjustments this admission: N/A  Microbiology results: 2/16 BCx: pend 2/16 UCx: pend   Richardine Service, PharmD, BCPS PGY2 Cardiology Pharmacy Resident Phone: [phone] 05/07/2020  6:05 PM  Please check [email or web address] for unit-specific pharmacy phone numbers.

## 2020-05-07 NOTE — Telephone Encounter (Signed)
Team Health Report/Call : ---Caller states her grandmother has a new onset of severe confusion. States confusion started 1-2 weeks ago and is progressively worsening daily. She has tendency to get septic and confusion with UTI's- she did see urologist and had catheter and they ruled out UTI Tuesday 2/8. She is diabetic- last BS 104. No vomiting or signs of illness  Advised call EMS 911 now. *caller states she is going to call her aunt and discuss with her mother if they will call 911 or transport her themselves to local ED

## 2020-05-07 NOTE — H&P (Signed)
History and Physical    Kayla Thompson SAY:[id number] DOB: Aug 12, 1935 DOA: 05/07/2020  PCP: [name], MD  Patient coming from: [place] ED  I have personally briefly reviewed patient's old medical records in [place]  Chief Complaint: Altered mental status, dysuria  HPI: Kayla Thompson is a 85 y.o. female with medical history significant for COPD/asthma on chronic prednisone 2 mg daily, insulin-dependent type 2 diabetes, HFpEF, hypertension, hyperlipidemia, hypothyroidism, depression/anxiety, chronic pain, recurrent UTI with history of ESBL E. coli UTI who presents to the ED for evaluation of altered mental status.  Patient reports chronic intermittent memory issues, worsened over the last few days.  She has had associated dysuria and polyuria.  She has had occasional feelings of her heart racing.  She otherwise denies any subjective fevers, chills, diaphoresis, chest pain, dyspnea, abdominal pain.  She reports somewhat loose stools which she attributes to mineral oil which she started taking to help with constipation.  She says she currently lives alone and has 24/7 home aide.  ED Course:  Initial vitals showed BP 131/85, pulse 100, RR 20, temp 98.5 F, SPO2 95% on room air.  While in the ED patient had intermittent tachypnea with RR frequently between 21-25.  Labs significant for WBC 14.1, hemoglobin 11.0, platelets 351,000, sodium 137, potassium 4.1, bicarb 20, BUN 20, creatinine 0.92, serum glucose 177, lipase 26, lactic acid 1.6.  Urinalysis showed negative nitrites, small leukocytes, 6-10 RBC/hpf, >50 WBC/hpf, few bacteria microscopy.  Urine and blood cultures were obtained and pending.  SARS-CoV-2 PCR and influenza A/B PCR's are negative.  CT head without contrast is negative for acute intracranial abnormality.  Generalized cerebral volume loss and moderate chronic small vessel ischemic changes in the cerebral white matter noted.  Portable chest  x-ray shows minimal reticular opacities at the right costophrenic angle favoring scarring or atelectasis.  No pleural effusion, focal consolidation, or pneumothorax noted.  Patient was given 1 L LR and started on IV meropenem.  The hospitalist service was consulted to admit for further evaluation and management.  Review of Systems:  All systems reviewed and are negative except as documented in history of present illness above.   Past Medical History:  Diagnosis Date  . Anxiety   . Arthritis   . Asthma   . Cataracts, bilateral    removed by surgery  . COPD (chronic obstructive pulmonary disease) ([place])   . Depression   . Diabetes mellitus without complication ([place])    type 2  . Dyspnea    with exertion  . Fibromyalgia   . GERD (gastroesophageal reflux disease)   . History of chemotherapy   . History of fractured pelvis   . Hypertension   . Hypothyroidism   . Osteoporosis 12/2017   T score -2.8 stable from prior DEXA  . Osteoporosis   . Ovarian cancer ([place])   . Pneumonia    several times    Past Surgical History:  Procedure Laterality Date  . ABDOMINAL HYSTERECTOMY  1996  . ANKLE FRACTURE SURGERY     plate and screws  . BIOPSY  02/05/2019   Procedure: BIOPSY;  Surgeon: Rush Landmark Telford Nab., MD;  Location: [facility];  Service: Gastroenterology;;  . BLADDER SUSPENSION    . CATARACT EXTRACTION    . COLONOSCOPY    . ESOPHAGEAL BRUSHING  02/05/2019   Procedure: ESOPHAGEAL BRUSHING;  Surgeon: Rush Landmark Telford Nab., MD;  Location: [facility] ENDOSCOPY;  Service: Gastroenterology;;  . ESOPHAGOGASTRODUODENOSCOPY (EGD) WITH PROPOFOL N/A  02/05/2019   Procedure: ESOPHAGOGASTRODUODENOSCOPY (EGD) WITH PROPOFOL with dialtion;  Surgeon: Rush Landmark Telford Nab., MD;  Location: [facility];  Service: Gastroenterology;  Laterality: N/A;  . EYE SURGERY     cataracts removed-bilateral  . hip surgey    . knee surgey    . LUNG SURGERY    . OOPHORECTOMY     BSO  . SAVORY DILATION N/A  02/05/2019   Procedure: SAVORY DILATION;  Surgeon: Rush Landmark Telford Nab., MD;  Location: [facility];  Service: Gastroenterology;  Laterality: N/A;  . TONSILLECTOMY    . UPPER GI ENDOSCOPY      Social History:  reports that she has never smoked. She has never used smokeless tobacco. She reports that she does not drink alcohol and does not use drugs.  Allergies  Allergen Reactions  . Latex Rash  . Penicillins Rash    Has patient had a PCN reaction causing immediate rash, facial/tongue/throat swelling, SOB or lightheadedness with hypotension: No Has patient had a PCN reaction causing severe rash involving mucus membranes or skin necrosis: No Has patient had a PCN reaction that required hospitalization: No Has patient had a PCN reaction occurring within the last 10 years: No If all of the above answers are "NO", then may proceed with Cephalosporin use.     Family History  Problem Relation Age of Onset  . Lung cancer Mother   . CAD Father   . Diabetes Father   . Lung cancer Maternal Aunt   . Diabetes Paternal Aunt   . Diabetes Paternal Uncle   . Diabetes Paternal Grandmother   . Diabetes Paternal Grandfather   . Colon cancer Neg Hx   . Esophageal cancer Neg Hx   . Inflammatory bowel disease Neg Hx   . Liver disease Neg Hx   . Pancreatic cancer Neg Hx   . Rectal cancer Neg Hx   . Stomach cancer Neg Hx      Prior to Admission medications   Medication Sig Start Date End Date Taking? Authorizing Provider  albuterol (PROAIR HFA) 108 (90 Base) MCG/ACT inhaler Inhale 2 puffs into the lungs every 6 (six) hours as needed for wheezing or shortness of breath. 08/27/19  Yes Young, Tarri Fuller D, MD  amitriptyline (ELAVIL) 10 MG tablet TAKE 1 TABLET BY MOUTH EVERYDAY AT BEDTIME Patient taking differently: Take 10 mg by mouth at bedtime. 02/26/20  Yes [name], MD  baclofen (LIORESAL) 10 MG tablet TAKE 0.5-1 TABLET BY MOUTH TWICE DAILY AS NEEDED FOR MUSCLE SPASMS OR PAIN Patient  taking differently: Take 5-10 mg by mouth 2 (two) times daily as needed for muscle spasms. 02/27/20  Yes [name], MD  BD PEN NEEDLE NANO 2ND GEN 32G X 4 MM MISC USE AS DIRECTED 4 TIMES A DAY 10/30/19  Yes [name], MD  busPIRone (BUSPAR) 5 MG tablet TAKE 1 TO 2 TABLETS BY MOUTH TWICE A DAY Patient taking differently: Take 5-10 mg by mouth 2 (two) times daily. 03/25/20  Yes [name], MD  clotrimazole-betamethasone (LOTRISONE) cream APPLY TO AFFECTED AREA TWICE A DAY Patient taking differently: Apply 1 application topically 2 (two) times daily. 02/26/20  Yes [name], MD  DULoxetine (CYMBALTA) 30 MG capsule Take 1 capsule (30 mg total) by mouth daily. Plan to see her PCP for further refills 12/26/19  Yes Marrian Salvage, FNP  DULoxetine (CYMBALTA) 60 MG capsule TAKE 1 CAPSULE BY MOUTH EVERY DAY Patient taking differently: Take 60 mg by mouth daily. 03/25/20  Yes [name], MD  feeding supplement (ENSURE ENLIVE / ENSURE PLUS) LIQD Take 237 mLs by mouth 2 (two) times daily between meals. 01/16/20  Yes Hongalgi, Lenis Dickinson, MD  Fluticasone-Umeclidin-Vilant (TRELEGY ELLIPTA) 100-62.5-25 MCG/INH AEPB Inhale 1 puff into the lungs daily. Rinse mouth 07/02/19  Yes Young, Clinton D, MD  glucose blood (FREESTYLE LITE) test strip USE TO TEST BLOOD SUGAR 3 TIMES DAILY. DX: E11.65 03/04/20  Yes Philemon Kingdom, MD  HYDROcodone-acetaminophen (NORCO) 10-325 MG tablet Take 1 tablet by mouth every 4 (four) hours as needed for severe pain. 11/15/19  Yes [provider]  insulin glargine (LANTUS SOLOSTAR) 100 UNIT/ML Solostar Pen Inject 15 Units into the skin daily. 01/15/20  Yes Hongalgi, Lenis Dickinson, MD  ipratropium-albuterol (DUONEB) 0.5-2.5 (3) MG/3ML SOLN Inhale 3 mLs into the lungs 3 (three) times daily as needed (shortness of breath). USE 1 VIAL IN NEBULIZER 3 TIMES DAILY 01/15/20  Yes Hongalgi, Lenis Dickinson, MD  Lancets (FREESTYLE) lancets Use to test  blood sugar 3 times daily. Dx: E11.65 10/14/16  Yes Philemon Kingdom, MD  levothyroxine (SYNTHROID) 50 MCG tablet TAKE 1 TABLET BY MOUTH EVERY DAY BEFORE BREAKFAST Patient taking differently: Take 50 mcg by mouth daily before breakfast. 12/13/19  Yes [name], MD  lidocaine (XYLOCAINE) 5 % ointment Apply 1 application topically as needed. Apply to external vulvar area 01/02/20  Yes Joseph Pierini, MD  magnesium oxide (MAG-OX) 400 MG tablet TAKE 1 TABLET BY MOUTH EVERY DAY Patient taking differently: Take 400 mg by mouth daily. 02/26/20  Yes [name], MD  montelukast (SINGULAIR) 10 MG tablet TAKE 1 TABLET BY MOUTH EVERY DAY Patient taking differently: Take 10 mg by mouth daily. 10/30/19  Yes Young, Tarri Fuller D, MD  omeprazole (PRILOSEC) 20 MG capsule TAKE 1 CAPSULE BY MOUTH 2 TIMES A DAY BEFORE A MEAL Patient taking differently: Take 20 mg by mouth 2 (two) times daily before a meal. 11/27/19  Yes Mansouraty, Telford Nab., MD  predniSONE (DELTASONE) 1 MG tablet Take 2 tablets (2 mg total) by mouth daily. 07/25/18  Yes [name], MD  rosuvastatin (CRESTOR) 10 MG tablet Take 1 tablet (10 mg total) by mouth every evening. 07/02/19  Yes [name], MD  azithromycin [facility]) 250 MG tablet 2 today then one daily 02/28/20   Baird Lyons D, MD  ferrous gluconate (FERGON) 324 MG tablet TAKE 1 TABLET BY MOUTH EVERY DAY WITH BREAKFAST 06/27/19   Mansouraty, Telford Nab., MD  fluconazole (DIFLUCAN) 100 MG tablet Take 1 tablet (100 mg total) by mouth daily. 02/08/20   Marrian Salvage, FNP  Misc Natural Products (TART CHERRY ADVANCED PO) Take by mouth. Once daily    [provider]  UNABLE TO FIND Take 1,000 mg by mouth daily. Med Name: Tumeric    [provider]    Physical Exam: Vitals:   05/07/20 1742 05/07/20 1745 05/07/20 1937 05/07/20 2143  BP: (!) 142/82 (!) 142/82 (!) 166/86 (!) 160/85  Pulse: 85 84 92 90  Resp: 16 20 18 19   Temp:       TempSrc:      SpO2: 100% 98% 100% 95%  Weight:       Constitutional: Elderly woman resting supine in bed, NAD, calm, comfortable Eyes: PERRL, lids and conjunctivae normal ENMT: Mucous membranes are moist. Posterior pharynx clear of any exudate or lesions.Normal dentition.  Neck: normal, supple, no masses. Respiratory: clear to auscultation bilaterally, no wheezing, no crackles. Normal respiratory effort. No  accessory muscle use.  Cardiovascular: Regular rate and rhythm, no murmurs / rubs / gallops.  Trace lower extremity edema. 2+ pedal pulses. Abdomen: no tenderness, no masses palpated. No hepatosplenomegaly. Bowel sounds positive.  Musculoskeletal: no clubbing / cyanosis.  Arthritic changes to bilateral hands.  Good ROM, no contractures. Normal muscle tone.  Skin: no rashes, lesions, ulcers. No induration Neurologic: CN 2-12 grossly intact. Sensation intact. Strength 5/5 in all 4.  Psychiatric: Normal judgment and insight. Alert and oriented to self and year.  Knows that she is in the hospital. Normal mood.  Denies any hallucinations.  Labs on Admission: I have personally reviewed following labs and imaging studies  CBC: Recent Labs  Lab 05/07/20 1442  WBC 14.1*  NEUTROABS 9.2*  HGB 11.0*  HCT 33.6*  MCV 107.7*  PLT 270   Basic Metabolic Panel: Recent Labs  Lab 05/07/20 1442  NA 137  K 4.1  CL 106  CO2 20*  GLUCOSE 177*  BUN 20  CREATININE 0.92  CALCIUM 8.9   GFR: Estimated Creatinine Clearance: 38.5 mL/min (by C-G formula based on SCr of 0.92 mg/dL). Liver Function Tests: Recent Labs  Lab 05/07/20 1442  AST 16  ALT 16  ALKPHOS 31*  BILITOT 0.4  PROT 6.4*  ALBUMIN 3.2*   Recent Labs  Lab 05/07/20 1634  LIPASE 26   No results for input(s): AMMONIA in the last 168 hours. Coagulation Profile: No results for input(s): INR, PROTIME in the last 168 hours. Cardiac Enzymes: No results for input(s): CKTOTAL, CKMB, CKMBINDEX, TROPONINI in the last 168  hours. BNP (last 3 results) Recent Labs    11/20/19 1549 01/25/20 1147  PROBNP 58.0 103.0*   HbA1C: No results for input(s): HGBA1C in the last 72 hours. CBG: Recent Labs  Lab 05/07/20 2136  GLUCAP 115*   Lipid Profile: No results for input(s): CHOL, HDL, LDLCALC, TRIG, CHOLHDL, LDLDIRECT in the last 72 hours. Thyroid Function Tests: No results for input(s): TSH, T4TOTAL, FREET4, T3FREE, THYROIDAB in the last 72 hours. Anemia Panel: No results for input(s): VITAMINB12, FOLATE, FERRITIN, TIBC, IRON, RETICCTPCT in the last 72 hours. Urine analysis:    Component Value Date/Time   COLORURINE COLORLESS (A) 05/07/2020 1622   APPEARANCEUR CLOUDY (A) 05/07/2020 1622   LABSPEC 1.030 05/07/2020 1622   PHURINE 6.0 05/07/2020 1622   GLUCOSEU NEGATIVE 05/07/2020 1622   GLUCOSEU NEGATIVE 02/19/2020 1230   HGBUR MODERATE (A) 05/07/2020 1622   BILIRUBINUR NEGATIVE 05/07/2020 1622   BILIRUBINUR 1+ 06/13/2019 1531   KETONESUR NEGATIVE 05/07/2020 1622   PROTEINUR 100 (A) 05/07/2020 1622   UROBILINOGEN 0.2 02/19/2020 1230   NITRITE NEGATIVE 05/07/2020 1622   LEUKOCYTESUR SMALL (A) 05/07/2020 1622    Radiological Exams on Admission: CT Head Wo Contrast  Result Date: 05/07/2020 CLINICAL DATA:  Mental status change. Confusion. Memory loss. Slurred speech for 2 weeks. No reported injury. EXAM: CT HEAD WITHOUT CONTRAST TECHNIQUE: Contiguous axial images were obtained from the base of the skull through the vertex without intravenous contrast. COMPARISON:  01/12/2020 head CT. FINDINGS: Brain: No evidence of parenchymal hemorrhage or extra-axial fluid collection. No mass lesion, mass effect, or midline shift. No CT evidence of acute infarction. Generalized cerebral volume loss. Nonspecific moderate subcortical and periventricular white matter hypodensity, most in keeping with chronic small vessel ischemic change. No ventriculomegaly. Vascular: No acute abnormality. Skull: No evidence of calvarial  fracture. Sinuses/Orbits: The visualized paranasal sinuses are essentially clear. Other:  The mastoid air cells are unopacified. IMPRESSION: 1. No evidence  of acute intracranial abnormality. 2. Generalized cerebral volume loss and moderate chronic small vessel ischemic changes in the cerebral white matter. Electronically Signed   By: Ilona Sorrel M.D.   On: 05/07/2020 17:13   DG Chest Port 1 View  Result Date: 05/07/2020 CLINICAL DATA:  Confusion, slurred speech, memory loss for 2 weeks EXAM: PORTABLE CHEST 1 VIEW COMPARISON:  01/25/2020 chest radiograph. FINDINGS: Stable cardiomediastinal silhouette with normal heart size. No pneumothorax. No pleural effusion. Minimal reticular opacities at the right costophrenic angle. No pulmonary edema. No acute consolidative airspace disease. Healed deformity in the lateral left fourth rib. IMPRESSION: Minimal reticular opacities at the right costophrenic angle, favor scarring or atelectasis. Otherwise no active disease in the chest. Electronically Signed   By: Ilona Sorrel M.D.   On: 05/07/2020 17:14    EKG: Personally reviewed. Normal sinus rhythm without acute ischemic changes.  Early R wave transition.  Tachycardia resolved when compared to prior.  Assessment/Plan Principal Problem:   Sepsis due to urinary tract infection (HCC) Active Problems:   Hypothyroidism   Asthma, chronic   Type 2 diabetes mellitus with hyperglycemia, with long-term current use of insulin (HCC)   Diastolic CHF, chronic (HCC)   Acute metabolic encephalopathy   Hypertension associated with diabetes ([place])   Hyperlipidemia associated with type 2 diabetes mellitus ([place])   Depression with anxiety  Kayla Thompson is a 85 y.o. female with medical history significant for COPD/asthma on chronic prednisone 2 mg daily, insulin-dependent type 2 diabetes, HFpEF, hypertension, hyperlipidemia, hypothyroidism, depression/anxiety, recurrent UTI with history of ESBL E. coli UTI who is admitted  with sepsis due to UTI with associated metabolic encephalopathy.  Sepsis due to UTI: Patient presented with dysuria, leukocytosis, pulse 100 and urinalysis suggestive of UTI.  Last admitted October 2021 with sepsis due to ESBL E. coli UTI/bacteremia. -Continue empiric IV meropenem -Follow blood and urine cultures  Acute septic encephalopathy: Secondary to sepsis/UTI.  Appears to be improving.  COPD/asthma on chronic maintenance prednisone: Chronic and stable.  Continue Trelegy, Singulair, as needed albuterol.  Hold home prednisone 2 mg daily for now.  Insulin-dependent type 2 diabetes: Continue home Lantus 15 units daily, add sensitive SSI.  HFpEF: Last EF 55-60%, G1 DD by TTE 01/08/2020.  Chronic and stable, not requiring diuretics as an outpatient.  Continue to monitor.  Hypertension: Stable, not on antihypertensives as an outpatient.  Hyperlipidemia: Continue rosuvastatin.  Hypothyroidism: Continue Synthroid.  Depression/anxiety: Continue home Cymbalta 90 mg daily, Elavil 10 mg nightly, BuSpar.  Chronic pain: Continue home Norco as needed with hold parameters.  DVT prophylaxis: Lovenox Code Status: DNR, confirmed with patient Family Communication: Discussed with patient, she has discussed with family Disposition Plan: From home and likely discharge to home pending improvement in sepsis physiology and further cultural data Consults called: None Level of care: Med-Surg Admission status:  Status is: Observation  The patient remains OBS appropriate and will d/c before 2 midnights.  Dispo: The patient is from: Home              Anticipated d/c is to: Home              Anticipated d/c date is: 1 day              Patient currently is not medically stable to d/c.    Zada Finders MD [facility]  If 7PM-7AM, please contact night-coverage [email or web address]  05/07/2020, 11:00 PM

## 2020-05-08 DIAGNOSIS — F418 Other specified anxiety disorders: Secondary | ICD-10-CM | POA: Diagnosis present

## 2020-05-08 DIAGNOSIS — Z7952 Long term (current) use of systemic steroids: Secondary | ICD-10-CM | POA: Diagnosis not present

## 2020-05-08 DIAGNOSIS — Z66 Do not resuscitate: Secondary | ICD-10-CM | POA: Diagnosis present

## 2020-05-08 DIAGNOSIS — J449 Chronic obstructive pulmonary disease, unspecified: Secondary | ICD-10-CM | POA: Diagnosis present

## 2020-05-08 DIAGNOSIS — E785 Hyperlipidemia, unspecified: Secondary | ICD-10-CM | POA: Diagnosis present

## 2020-05-08 DIAGNOSIS — Z91040 Latex allergy status: Secondary | ICD-10-CM | POA: Diagnosis not present

## 2020-05-08 DIAGNOSIS — N390 Urinary tract infection, site not specified: Secondary | ICD-10-CM | POA: Diagnosis present

## 2020-05-08 DIAGNOSIS — R531 Weakness: Secondary | ICD-10-CM | POA: Diagnosis present

## 2020-05-08 DIAGNOSIS — Z794 Long term (current) use of insulin: Secondary | ICD-10-CM | POA: Diagnosis not present

## 2020-05-08 DIAGNOSIS — Z20822 Contact with and (suspected) exposure to covid-19: Secondary | ICD-10-CM | POA: Diagnosis present

## 2020-05-08 DIAGNOSIS — E1169 Type 2 diabetes mellitus with other specified complication: Secondary | ICD-10-CM | POA: Diagnosis present

## 2020-05-08 DIAGNOSIS — I5032 Chronic diastolic (congestive) heart failure: Secondary | ICD-10-CM | POA: Diagnosis present

## 2020-05-08 DIAGNOSIS — G894 Chronic pain syndrome: Secondary | ICD-10-CM | POA: Diagnosis present

## 2020-05-08 DIAGNOSIS — R410 Disorientation, unspecified: Secondary | ICD-10-CM | POA: Diagnosis not present

## 2020-05-08 DIAGNOSIS — I152 Hypertension secondary to endocrine disorders: Secondary | ICD-10-CM | POA: Diagnosis present

## 2020-05-08 DIAGNOSIS — Z79890 Hormone replacement therapy (postmenopausal): Secondary | ICD-10-CM | POA: Diagnosis not present

## 2020-05-08 DIAGNOSIS — Z79899 Other long term (current) drug therapy: Secondary | ICD-10-CM | POA: Diagnosis not present

## 2020-05-08 DIAGNOSIS — A419 Sepsis, unspecified organism: Secondary | ICD-10-CM | POA: Diagnosis not present

## 2020-05-08 DIAGNOSIS — Z87440 Personal history of urinary (tract) infections: Secondary | ICD-10-CM | POA: Diagnosis not present

## 2020-05-08 DIAGNOSIS — I110 Hypertensive heart disease with heart failure: Secondary | ICD-10-CM | POA: Diagnosis present

## 2020-05-08 DIAGNOSIS — E1165 Type 2 diabetes mellitus with hyperglycemia: Secondary | ICD-10-CM | POA: Diagnosis present

## 2020-05-08 DIAGNOSIS — G9341 Metabolic encephalopathy: Secondary | ICD-10-CM | POA: Diagnosis present

## 2020-05-08 DIAGNOSIS — E039 Hypothyroidism, unspecified: Secondary | ICD-10-CM | POA: Diagnosis present

## 2020-05-08 DIAGNOSIS — Z79891 Long term (current) use of opiate analgesic: Secondary | ICD-10-CM | POA: Diagnosis not present

## 2020-05-08 DIAGNOSIS — Z880 Allergy status to penicillin: Secondary | ICD-10-CM | POA: Diagnosis not present

## 2020-05-08 DIAGNOSIS — A4151 Sepsis due to Escherichia coli [E. coli]: Secondary | ICD-10-CM | POA: Diagnosis present

## 2020-05-08 LAB — BASIC METABOLIC PANEL
Anion gap: 12 (ref 5–15)
BUN: 15 mg/dL (ref 8–23)
CO2: 20 mmol/L — ABNORMAL LOW (ref 22–32)
Calcium: 9 mg/dL (ref 8.9–10.3)
Chloride: 107 mmol/L (ref 98–111)
Creatinine, Ser: 0.83 mg/dL (ref 0.44–1.00)
GFR, Estimated: >60 mL/min (ref >60)
Glucose, Bld: 88 mg/dL (ref 70–99)
Potassium: 3.7 mmol/L (ref 3.5–5.1)
Sodium: 139 mmol/L (ref 135–145)

## 2020-05-08 LAB — URINE CULTURE: Culture: NO GROWTH

## 2020-05-08 LAB — CBC
HCT: 33.3 % — ABNORMAL LOW (ref 36.0–46.0)
Hemoglobin: 10.5 g/dL — ABNORMAL LOW (ref 12.0–15.0)
MCH: 35 pg — ABNORMAL HIGH (ref 26.0–34.0)
MCHC: 31.5 g/dL (ref 30.0–36.0)
MCV: 111 fL — ABNORMAL HIGH (ref 80.0–100.0)
Platelets: 310 10*3/uL (ref 150–400)
RBC: 3 MIL/uL — ABNORMAL LOW (ref 3.87–5.11)
RDW: 13.1 % (ref 11.5–15.5)
WBC: 12.5 10*3/uL — ABNORMAL HIGH (ref 4.0–10.5)
nRBC: 0 % (ref 0.0–0.2)

## 2020-05-08 LAB — HEMOGLOBIN A1C
Hgb A1c MFr Bld: 5.9 % — ABNORMAL HIGH (ref 4.8–5.6)
Mean Plasma Glucose: 122.63 mg/dL

## 2020-05-08 LAB — GLUCOSE, CAPILLARY
Glucose-Capillary: 81 mg/dL (ref 70–99)
Glucose-Capillary: 250 mg/dL — ABNORMAL HIGH (ref 70–99)
Glucose-Capillary: 104 mg/dL — ABNORMAL HIGH (ref 70–99)
Glucose-Capillary: 139 mg/dL — ABNORMAL HIGH (ref 70–99)

## 2020-05-08 LAB — PROCALCITONIN: Procalcitonin: 0.58 ng/mL

## 2020-05-08 NOTE — Evaluation (Signed)
Physical Therapy Evaluation Patient Details Name: Kayla Thompson MRN: [id number] DOB: 11/22/1935 Today's Date: 05/08/2020   History of Present Illness  Kayla Thompson is a 85 y.o. female with medical history significant for COPD/asthma on chronic prednisone 2 mg daily, insulin-dependent type 2 diabetes, HFpEF, hypertension, hyperlipidemia, hypothyroidism, depression/anxiety, chronic pain, recurrent UTI with history of ESBL E. coli UTI who presents to the ED for evaluation of altered mental status.    Clinical Impression  Kayla Thompson is 85 y.o. female admitted with above HPI and diagnosis. Patient is currently limited by functional impairments below (see PT problem list). Patient lives alone but has 24/7 home aids to assist with ADL's and mobility. She currently requires min guard/assist for transfers/gait with rollator and cues for safety and ambulated ~ 2x20' today. Patient will benefit from continued skilled PT interventions to address impairments and progress independence with mobility, recommending HHPT follow up with continued 24/7 assist from home aids. Acute PT will follow and progress as able.     Follow Up Recommendations Home health PT;Supervision/Assistance - 24 hour    Equipment Recommendations  None recommended by PT    Recommendations for Other Services       Precautions / Restrictions Precautions Precautions: Fall Restrictions Weight Bearing Restrictions: No      Mobility  Bed Mobility Overal bed mobility: Needs Assistance             General bed mobility comments: pt OOB in recliner    Transfers Overall transfer level: Needs assistance Equipment used: 4-wheeled walker Transfers: Sit to/from Stand Sit to Stand: Min assist;Min guard         General transfer comment: x3 from recliner; Min assist progressing ot MIN guard with cues for safe hand placement and to lock rollator prior to transfer to maintain pt safety.  Ambulation/Gait Ambulation/Gait  assistance: Min assist;Min guard Gait Distance (Feet): 40 Feet (2x20) Assistive device: 4-wheeled walker Gait Pattern/deviations: Step-through pattern;Decreased step length - right;Decreased step length - left;Decreased stride length Gait velocity: decr   General Gait Details: pt required x1 seated rest break 2/2 fatigue and LE weakness. Pt required intermittent assist for rollator position with turning, but maintained safe proximity 75% of time with guarding for safety. no overt LOB.  Stairs            Wheelchair Mobility    Modified Rankin (Stroke Patients Only)       Balance Overall balance assessment: Needs assistance;History of Falls Sitting-balance support: Feet supported Sitting balance-Leahy Scale: Good     Standing balance support: Bilateral upper extremity supported;During functional activity Standing balance-Leahy Scale: Poor Standing balance comment: pt reports history of falling, requires UE support for gait/dynamic standing                             Pertinent Vitals/Pain Pain Assessment: 0-10 Faces Pain Scale: Hurts a little bit Pain Location: R ankle Pain Descriptors / Indicators: Sore Pain Intervention(s): Limited activity within patient's tolerance;Monitored during session;Repositioned    Home Living Family/patient expects to be discharged to:: Private residence Living Arrangements: Alone Available Help at Discharge: Family;Personal care attendant;Available 24 hours/day Type of Home: House Home Access: Stairs to enter Entrance Stairs-Rails: None Entrance Stairs-Number of Steps: 2 Home Layout: One level Home Equipment: Walker - 2 wheels;Bedside commode;Shower seat;Walker - 4 wheels Additional Comments: pt has 24/ aide and her daughter lives nearby and visits a few times per week. Pt ambulates household distances  with rollator.    Prior Function Level of Independence: Independent with assistive device(s);Needs assistance   Gait /  Transfers Assistance Needed: rollator for gait and aide assists/supervises with mobility.  ADL's / Homemaking Assistance Needed: home aides help with pt getting in/out of shower, has help for bathing, dressing, toileting. Incontinent at baseline and wears Depends. Goes to bathroom for BM only.        Hand Dominance   Dominant Hand: Right    Extremity/Trunk Assessment   Upper Extremity Assessment Upper Extremity Assessment: Defer to OT evaluation    Lower Extremity Assessment Lower Extremity Assessment: Generalized weakness    Cervical / Trunk Assessment Cervical / Trunk Assessment: Normal  Communication   Communication: No difficulties  Cognition Arousal/Alertness: Awake/alert Behavior During Therapy: WFL for tasks assessed/performed Overall Cognitive Status: History of cognitive impairments - at baseline                                 General Comments: pt oriented to person, month/date/year, unable to recall name of hospital but remembered she had been here a few months ago.      General Comments      Exercises     Assessment/Plan    PT Assessment Patient needs continued PT services  PT Problem List Decreased strength;Decreased activity tolerance;Decreased balance;Decreased mobility;Decreased coordination;Decreased cognition;Decreased knowledge of use of DME;Decreased safety awareness;Decreased knowledge of precautions       PT Treatment Interventions DME instruction;Gait training;Functional mobility training;Therapeutic exercise;Balance training;Therapeutic activities;Neuromuscular re-education;Patient/family education;Stair training    PT Goals (Current goals can be found in the Care Plan section)  Acute Rehab PT Goals Patient Stated Goal: none stated PT Goal Formulation: With patient Time For Goal Achievement: 05/22/20 Potential to Achieve Goals: Good    Frequency Min 3X/week   Barriers to discharge        Co-evaluation                AM-PAC PT "6 Clicks" Mobility  Outcome Measure Help needed turning from your back to your side while in a flat bed without using bedrails?: A Little Help needed moving from lying on your back to sitting on the side of a flat bed without using bedrails?: A Little Help needed moving to and from a bed to a chair (including a wheelchair)?: A Little Help needed standing up from a chair using your arms (e.g., wheelchair or bedside chair)?: A Little Help needed to walk in hospital room?: A Little Help needed climbing 3-5 steps with a railing? : A Lot 6 Click Score: 17    End of Session Equipment Utilized During Treatment: Gait belt Activity Tolerance: Patient tolerated treatment well Patient left: in chair;with chair alarm set;with call bell/phone within reach Nurse Communication: Mobility status PT Visit Diagnosis: Unsteadiness on feet (R26.81);Muscle weakness (generalized) (M62.81)    Time: 9381-0175 PT Time Calculation (min) (ACUTE ONLY): 21 min   Charges:   PT Evaluation $PT Eval Low Complexity: 1 Low          Verner Mould, DPT Acute Rehabilitation Services Office [phone] Pager [phone]     Jacques Navy 05/08/2020, 2:48 PM

## 2020-05-08 NOTE — Evaluation (Signed)
Occupational Therapy Evaluation Patient Details Name: Kayla Thompson MRN: [id number] DOB: Jan 28, 1936 Today's Date: 05/08/2020    History of Present Illness Kayla Thompson is a 85 y.o. female with medical history significant for COPD/asthma on chronic prednisone 2 mg daily, insulin-dependent type 2 diabetes, HFpEF, hypertension, hyperlipidemia, hypothyroidism, depression/anxiety, chronic pain, recurrent UTI with history of ESBL E. coli UTI who presents to the ED for evaluation of altered mental status.   Clinical Impression   Mrs. Kayla Thompson is an 85 year old woman admitted with altered mental status secondary to UTI. On evaluation she presents with chronic shoulder pain and impaired non-functional ROM, needing mod assist to transfer out of bed and min assist to stand. Patient needed assistance for all ADLs. During each step of ADL activity patient reports caregivers help her with that - lower body dressing, donning left knee sleeve, donning brief and pulling up brief, lower body bathing, and with standing. Patient wears Depends at baseline and only goes to bathroom for BM. Reports using rollator at baseline. Patient reports she has 24/7 caregivers. Patient appears to be at her baseline in regards to self care tasks and functional mobility. No OT needs at this time.      Follow Up Recommendations  No OT follow up    Equipment Recommendations  None recommended by OT    Recommendations for Other Services PT consult     Precautions / Restrictions Precautions Precautions: Fall Restrictions Weight Bearing Restrictions: No Other Position/Activity Restrictions: reports painful and impaired shoulders at baseline.      Mobility Bed Mobility Overal bed mobility: Needs Assistance Bed Mobility: Supine to Sit     Supine to sit: Mod assist     General bed mobility comments: Patient able to manuever LEs but needed assistance for trunk negotiation; limited UE use initially due to shoulder  ROM impairments and pain. Reports caregivers help her with geting up.    Transfers Overall transfer level: Needs assistance Equipment used: Rolling walker (2 wheeled) Transfers: Sit to/from [facility] Sit to Stand: Min assist Stand pivot transfers: Min assist       General transfer comment: Assisted with sit to stand with hand placed on pelvis. Reports caregivers pull up on her arms to stand her but that it hurts her shoulders.    Balance Overall balance assessment: Mild deficits observed, not formally tested                                         ADL either performed or assessed with clinical judgement   ADL Overall ADL's : Needs assistance/impaired Eating/Feeding: Set up;Sitting   Grooming: Wash/dry face;Wash/dry hands;Sitting;Modified independent Grooming Details (indicate cue type and reason): Needs assistance for hair grooming as she is unable to reach due to impaired shoulder ROM. Upper Body Bathing: Set up;Sitting   Lower Body Bathing: Maximal assistance;Sit to/from stand   Upper Body Dressing : Moderate assistance;Sitting Upper Body Dressing Details (indicate cue type and reason): able to wash thighs and front periarea, otherwise needs assistance. Performed sitting on side of bed. Lower Body Dressing: Maximal assistance;Sit to/from stand Lower Body Dressing Details (indicate cue type and reason): Max assist to pull brief up. Toilet Transfer: Minimal assistance;RW;Stand-pivot;BSC   Toileting- Clothing Manipulation and Hygiene: Sit to/from stand;Maximal assistance               Vision Patient Visual Report: No change  from baseline       Perception     Praxis      Pertinent Vitals/Pain Pain Assessment: Faces Faces Pain Scale: Hurts little more Pain Location: B shoulders Pain Descriptors / Indicators: Aching Pain Intervention(s): Monitored during session     Hand Dominance Right   Extremity/Trunk Assessment Upper  Extremity Assessment Upper Extremity Assessment: RUE deficits/detail;LUE deficits/detail RUE Deficits / Details: Impaired shoulder ROM, less than 45 degrees; functional lower arm ROM, Strenght not tested. LUE Deficits / Details: Impaired shoulder ROM, less than 45 degrees; functional lower arm ROM, Strenght not tested.   Lower Extremity Assessment Lower Extremity Assessment: Defer to PT evaluation   Cervical / Trunk Assessment Cervical / Trunk Assessment: Normal   Communication Communication Communication: No difficulties   Cognition   Behavior During Therapy: WFL for tasks assessed/performed Overall Cognitive Status: Within Functional Limits for tasks assessed                                     General Comments       Exercises     Shoulder Instructions      Home Living Family/patient expects to be discharged to:: Private residence Living Arrangements: Alone Available Help at Discharge: Family;Personal care attendant;Available 24 hours/day Type of Home: House Home Access: Stairs to enter [employer] of Steps: 2 Entrance Stairs-Rails: None Home Layout: One level     Bathroom Shower/Tub: Occupational psychologist: Standard Bathroom Accessibility: Yes   Home Equipment: Environmental consultant - 2 wheels;Bedside commode;Shower seat;Walker - 4 wheels   Additional Comments: an aide all day and night      Prior Functioning/Environment Level of Independence: Independent with assistive device(s);Needs assistance  Gait / Transfers Assistance Needed: Rollator for gait/household mobility, needs assistance to stand. ADL's / Homemaking Assistance Needed: home aides help with pt getting in/out of shower, has help for bathing, dressing, toileting. Incontinent at baseline and wears Depends. Goes to bathroom for BM only.            OT Problem List:        OT Treatment/Interventions:      OT Goals(Current goals can be found in the care plan section) Acute  Rehab OT Goals Patient Stated Goal: Did not state. OT Goal Formulation: All assessment and education complete, DC therapy  OT Frequency:     Barriers to D/C:            Co-evaluation              AM-PAC OT "6 Clicks" Daily Activity     Outcome Measure Help from another person eating meals?: A Little Help from another person taking care of personal grooming?: A Little Help from another person toileting, which includes using toliet, bedpan, or urinal?: A Lot Help from another person bathing (including washing, rinsing, drying)?: A Lot Help from another person to put on and taking off regular upper body clothing?: A Lot Help from another person to put on and taking off regular lower body clothing?: A Lot 6 Click Score: 14   End of Session Equipment Utilized During Treatment: Rolling walker Nurse Communication: Mobility status  Activity Tolerance: Patient tolerated treatment well Patient left: in chair;with call bell/phone within reach  OT Visit Diagnosis: Muscle weakness (generalized) (M62.81)                Time: 9357-0177 OT Time Calculation (min): 28 min Charges:  OT General Charges $OT Visit: 1 Visit OT Evaluation $OT Eval Moderate Complexity: 1 Mod  Hagan Maltz, OTR/L [place]  Office [phone] Pager: [place] 05/08/2020, 10:39 AM

## 2020-05-08 NOTE — Progress Notes (Signed)
PROGRESS NOTE    Kayla Thompson  NTI:[id number] DOB: 1935-03-30 DOA: 05/07/2020 PCP: [name], MD    Brief Narrative:  Kayla Thompson is an 85 year old female with past medical history significant for COPD/asthma on chronic prednisone, insulin-dependent type 2 diabetes mellitus, chronic diastolic congestive heart failure, essential hypertension, hyperlipidemia, hypothyroidism, depression/anxiety, chronic pain syndrome, recurrent UTI with history of ESBL E. coli and Klebsiella pneumonia UTI who presented to the ED with confusion that has progressed over the last 2 weeks.  Patient also reports associated dysuria and polyuria.  Additionally, patient reports some loose stools which she attributes to mineral oil which he started taking to help relieve constipation.  In the ED, BP 131/85, HR 100, RR 20, temperature 98.5 F, SPO2 95% on room air.  WBC 14.1, hemoglobin 11.0, platelets 351, sodium 137, potassium 4.1, CO2 20, BUN 20, creatinine 0.92, glucose 177, lipase 26, lactic acid 1.6.  Urinalysis with small leukoesterase, negative nitrites, greater than 50 WBCs and few bacteria.  Urine and blood cultures obtained.  SARS-CoV-2 and influenza a/B PCR negative.  CT head without contrast negative for acute intracranial abnormality but does note generalized cerebral volume loss and moderate chronic small vessel ischemic changes in the cerebral white matter.  Chest x-ray with minimal reticular opacities right costophrenic angle consistent with scarring versus atelectasis; no focal consolidation/pneumothorax/pleural effusion.  Patient was given 1 L LR bolus and started on IV meropenem.  Hospital service consulted for further evaluation and management of acute metabolic encephalopathy secondary to UTI.   Assessment & Plan:   Principal Problem:   Sepsis due to urinary tract infection ([place]) Active Problems:   Hypothyroidism   Asthma, chronic   Type 2 diabetes mellitus with hyperglycemia, with  long-term current use of insulin ([facility])   Diastolic CHF, chronic ([facility])   Acute metabolic encephalopathy   Hypertension associated with diabetes ([place])   Hyperlipidemia associated with type 2 diabetes mellitus ([place])   Depression with anxiety   Sepsis secondary to UTI, POA Acute metabolic encephalopathy, POA Patient presented to the ED with confusion, elevated heart rate, leukocytosis with urinalysis suggestive of urinary tract infection.  CT head and chest x-ray unrevealing.  History of recurrent UTIs, most recently with ESBL E. coli and Klebsiella pneumonia. --Blood cultures x2: Pending --Urine culture: Pending --Continue IV meropenem; will de-escalate once urine culture identification and susceptibilities return --Supportive care  COPD/Asthma, Nonoxygen dependent. --Continue Trelegy, Singulair, as needed albuterol.   --Hold home prednisone 2 mg daily for now --Continue SPO2 checks with vital signs per unit routine  Insulin-dependent type 2 diabetes mellitus Hemoglobin A1c 5.9 05/08/2020, well controlled. --Continue home Lantus 15 subcutaneously daily --Sensitive SSI for further coverage --CBGs qAC/HS  Essential hypertension Currently off antihypertensives outpatient. --Continue monitor BP closely  Hyperlipidemia: Continue crestor  Hypothyroidism: TSH 0.681 on 05/07/2020. --Continue levothyroxine 50 mcg p.o. daily  Depression/anxiety: --Cymbalta 90 mg p.o. daily --Elavil 10 mg p.o. nightly --BuSpar 5 mg p.o. twice daily  Chronic pain syndrome: --Continue home Norco  Weakness: Patient currently resides at home alone with caregiver during the day. --OT, no needs identified --Pending PT evaluation   DVT prophylaxis: Lovenox   Code Status: DNR Family Communication: Updated patient's daughter, [name] via telephone this morning  Disposition Plan:  Level of care: Med-Surg Status is: Observation  The patient remains OBS appropriate and will d/c before 2  midnights.  Dispo: The patient is from: Home              Anticipated  d/c is to: Home              Anticipated d/c date is: 2 days              Patient currently is not medically stable to d/c.   Difficult to place patient No   Consultants:   None  Procedures:   None  Antimicrobials:   Meropenem 2/16>>   Subjective: Patient seen and examined at bedside, resting comfortably.  No specific complaints this morning.  Continues with mild confusion, knows the year is 2022 and believes she is in "her room" and does not know who the president [place] is.  Denies headache, no fever/chills/night sweats, no nausea/vomiting/diarrhea, no chest pain, no palpitations, no shortness of breath, no abdominal pain, no fatigue.  No acute events overnight per nursing staff.  Objective: Vitals:   05/07/20 2301 05/08/20 0253 05/08/20 0637 05/08/20 0954  BP: (!) 146/73 (!) 159/71 (!) 153/85 (!) 151/81  Pulse: 82 75 79 (!) 109  Resp: 16 16 16 18   Temp: 98 F (36.7 C) 98 F (36.7 C) 98.2 F (36.8 C) 99.1 F (37.3 C)  TempSrc: Oral Oral Oral Oral  SpO2: 96% 100% 98% 96%  Weight:      Height:        Intake/Output Summary (Last 24 hours) at 05/08/2020 1113 Last data filed at 05/08/2020 0900 Gross per 24 hour  Intake 1460 ml  Output 700 ml  Net 760 ml   Filed Weights   05/07/20 1429 05/07/20 2300  Weight: 64.4 kg 54.3 kg    Examination:  General exam: Appears calm and comfortable  Respiratory system: Clear to auscultation. Respiratory effort normal. Cardiovascular system: S1 & S2 heard, RRR. No JVD, murmurs, rubs, gallops or clicks. No pedal edema. Gastrointestinal system: Abdomen is nondistended, soft and nontender. No organomegaly or masses felt. Normal bowel sounds heard. Central nervous system: Alert and oriented. No focal neurological deficits. Extremities: Symmetric 5 x 5 power. Skin: No rashes, lesions or ulcers Psychiatry: Judgement and insight appear normal. Mood & affect  appropriate.     Data Reviewed: I have personally reviewed following labs and imaging studies  CBC: Recent Labs  Lab 05/07/20 1442 05/08/20 0312  WBC 14.1* 12.5*  NEUTROABS 9.2*  --   HGB 11.0* 10.5*  HCT 33.6* 33.3*  MCV 107.7* 111.0*  PLT 351 099   Basic Metabolic Panel: Recent Labs  Lab 05/07/20 1442 05/08/20 0312  NA 137 139  K 4.1 3.7  CL 106 107  CO2 20* 20*  GLUCOSE 177* 88  BUN 20 15  CREATININE 0.92 0.83  CALCIUM 8.9 9.0   GFR: Estimated Creatinine Clearance: 39.3 mL/min (by C-G formula based on SCr of 0.83 mg/dL). Liver Function Tests: Recent Labs  Lab 05/07/20 1442  AST 16  ALT 16  ALKPHOS 31*  BILITOT 0.4  PROT 6.4*  ALBUMIN 3.2*   Recent Labs  Lab 05/07/20 1634  LIPASE 26   No results for input(s): AMMONIA in the last 168 hours. Coagulation Profile: No results for input(s): INR, PROTIME in the last 168 hours. Cardiac Enzymes: No results for input(s): CKTOTAL, CKMB, CKMBINDEX, TROPONINI in the last 168 hours. BNP (last 3 results) Recent Labs    11/20/19 1549 01/25/20 1147  PROBNP 58.0 103.0*   HbA1C: Recent Labs    05/08/20 0312  HGBA1C 5.9*   CBG: Recent Labs  Lab 05/07/20 2136 05/08/20 0729  GLUCAP 115* 81   Lipid Profile: No results for input(s):  CHOL, HDL, LDLCALC, TRIG, CHOLHDL, LDLDIRECT in the last 72 hours. Thyroid Function Tests: Recent Labs    05/07/20 1634  TSH 0.681   Anemia Panel: No results for input(s): VITAMINB12, FOLATE, FERRITIN, TIBC, IRON, RETICCTPCT in the last 72 hours. Sepsis Labs: Recent Labs  Lab 05/07/20 1812 05/08/20 0312  PROCALCITON  --  0.58  LATICACIDVEN 1.6  --     Recent Results (from the past 240 hour(s))  SARS CORONAVIRUS 2 (TAT 6-24 HRS) Nasopharyngeal Nasopharyngeal Swab     Status: None   Collection Time: 05/07/20  4:34 PM   Specimen: Nasopharyngeal Swab  Result Value Ref Range Status   SARS Coronavirus 2 NEGATIVE NEGATIVE Final    Comment: (NOTE) SARS-CoV-2 target  nucleic acids are NOT DETECTED.  The SARS-CoV-2 RNA is generally detectable in upper and lower respiratory specimens during the acute phase of infection. Negative results do not preclude SARS-CoV-2 infection, do not rule out co-infections with other pathogens, and should not be used as the sole basis for treatment or other patient management decisions. Negative results must be combined with clinical observations, patient history, and epidemiological information. The expected result is Negative.  Fact Sheet for Patients: SugarRoll.be  Fact Sheet for Healthcare Providers: https://www.woods-mathews.com/  This test is not yet approved or cleared by the [place] FDA and  has been authorized for detection and/or diagnosis of SARS-CoV-2 by FDA under an Emergency Use Authorization (EUA). This EUA will remain  in effect (meaning this test can be used) for the duration of the COVID-19 declaration under Se ction 564(b)(1) of the Act, 21 U.S.C. section 360bbb-3(b)(1), unless the authorization is terminated or revoked sooner.  Performed at [facility], [place] [address], [place], [place] [address]   Resp Panel by RT-PCR (Flu A&B, Covid) Nasopharyngeal Swab     Status: None   Collection Time: 05/07/20  4:34 PM   Specimen: Nasopharyngeal Swab; Nasopharyngeal(NP) swabs in vial transport medium  Result Value Ref Range Status   SARS Coronavirus 2 by RT PCR NEGATIVE NEGATIVE Final    Comment: (NOTE) SARS-CoV-2 target nucleic acids are NOT DETECTED.  The SARS-CoV-2 RNA is generally detectable in upper respiratory specimens during the acute phase of infection. The lowest concentration of SARS-CoV-2 viral copies this assay can detect is 138 copies/mL. A negative result does not preclude SARS-Cov-2 infection and should not be used as the sole basis for treatment or other patient management decisions. A negative result may occur with  improper  specimen collection/handling, submission of specimen other than nasopharyngeal swab, presence of viral mutation(s) within the areas targeted by this assay, and inadequate number of viral copies(<138 copies/mL). A negative result must be combined with clinical observations, patient history, and epidemiological information. The expected result is Negative.  Fact Sheet for Patients:  EntrepreneurPulse.com.au  Fact Sheet for Healthcare Providers:  IncredibleEmployment.be  This test is no t yet approved or cleared by the [place] FDA and  has been authorized for detection and/or diagnosis of SARS-CoV-2 by FDA under an Emergency Use Authorization (EUA). This EUA will remain  in effect (meaning this test can be used) for the duration of the COVID-19 declaration under Section 564(b)(1) of the Act, 21 U.S.C.section 360bbb-3(b)(1), unless the authorization is terminated  or revoked sooner.       Influenza A by PCR NEGATIVE NEGATIVE Final   Influenza B by PCR NEGATIVE NEGATIVE Final    Comment: (NOTE) The Xpert Xpress SARS-CoV-2/FLU/RSV plus assay is intended as an aid in the  diagnosis of influenza from Nasopharyngeal swab specimens and should not be used as a sole basis for treatment. Nasal washings and aspirates are unacceptable for Xpert Xpress SARS-CoV-2/FLU/RSV testing.  Fact Sheet for Patients: EntrepreneurPulse.com.au  Fact Sheet for Healthcare Providers: IncredibleEmployment.be  This test is not yet approved or cleared by the [place] FDA and has been authorized for detection and/or diagnosis of SARS-CoV-2 by FDA under an Emergency Use Authorization (EUA). This EUA will remain in effect (meaning this test can be used) for the duration of the COVID-19 declaration under Section 564(b)(1) of the Act, 21 U.S.C. section 360bbb-3(b)(1), unless the authorization is terminated or revoked.  Performed at  [facility], [place] [address] [place], [place] [address]          Radiology Studies: CT Head Wo Contrast  Result Date: 05/07/2020 CLINICAL DATA:  Mental status change. Confusion. Memory loss. Slurred speech for 2 weeks. No reported injury. EXAM: CT HEAD WITHOUT CONTRAST TECHNIQUE: Contiguous axial images were obtained from the base of the skull through the vertex without intravenous contrast. COMPARISON:  01/12/2020 head CT. FINDINGS: Brain: No evidence of parenchymal hemorrhage or extra-axial fluid collection. No mass lesion, mass effect, or midline shift. No CT evidence of acute infarction. Generalized cerebral volume loss. Nonspecific moderate subcortical and periventricular white matter hypodensity, most in keeping with chronic small vessel ischemic change. No ventriculomegaly. Vascular: No acute abnormality. Skull: No evidence of calvarial fracture. Sinuses/Orbits: The visualized paranasal sinuses are essentially clear. Other:  The mastoid air cells are unopacified. IMPRESSION: 1. No evidence of acute intracranial abnormality. 2. Generalized cerebral volume loss and moderate chronic small vessel ischemic changes in the cerebral white matter. Electronically Signed   By: Ilona Sorrel M.D.   On: 05/07/2020 17:13   DG Chest Port 1 View  Result Date: 05/07/2020 CLINICAL DATA:  Confusion, slurred speech, memory loss for 2 weeks EXAM: PORTABLE CHEST 1 VIEW COMPARISON:  01/25/2020 chest radiograph. FINDINGS: Stable cardiomediastinal silhouette with normal heart size. No pneumothorax. No pleural effusion. Minimal reticular opacities at the right costophrenic angle. No pulmonary edema. No acute consolidative airspace disease. Healed deformity in the lateral left fourth rib. IMPRESSION: Minimal reticular opacities at the right costophrenic angle, favor scarring or atelectasis. Otherwise no active disease in the chest. Electronically Signed   By: Ilona Sorrel M.D.   On: 05/07/2020 17:14         Scheduled Meds: . amitriptyline  10 mg Oral QHS  . busPIRone  5 mg Oral BID  . DULoxetine  90 mg Oral Daily  . enoxaparin (LOVENOX) injection  40 mg Subcutaneous Q24H  . feeding supplement  237 mL Oral BID BM  . insulin aspart  0-9 Units Subcutaneous TID WC  . insulin glargine  15 Units Subcutaneous Daily  . levothyroxine  50 mcg Oral Q0600  . mometasone-formoterol  2 puff Inhalation BID   And  . umeclidinium bromide  1 puff Inhalation Daily  . montelukast  10 mg Oral Daily  . pantoprazole  40 mg Oral Daily  . rosuvastatin  10 mg Oral QPM   Continuous Infusions: . meropenem (MERREM) IV Stopped (05/07/20 1925)     LOS: 0 days    Time spent: 36 minutes spent on chart review, discussion with nursing staff, consultants, updating family and interview/physical exam; more than 50% of that time was spent in counseling and/or coordination of care.    Ajai Terhaar J [place] DO [facility] Available via Epic secure chat 7am-7pm After these hours,  please refer to coverage provider listed on [email or web address] 05/08/2020, 11:13 AM

## 2020-05-09 ENCOUNTER — Inpatient Hospital Stay: Admission: RE | Admit: 2020-05-09 | Payer: Medicare Other | Source: Ambulatory Visit

## 2020-05-09 DIAGNOSIS — N390 Urinary tract infection, site not specified: Secondary | ICD-10-CM | POA: Diagnosis not present

## 2020-05-09 DIAGNOSIS — A419 Sepsis, unspecified organism: Secondary | ICD-10-CM | POA: Diagnosis not present

## 2020-05-09 LAB — GLUCOSE, CAPILLARY
Glucose-Capillary: 89 mg/dL (ref 70–99)
Glucose-Capillary: 118 mg/dL — ABNORMAL HIGH (ref 70–99)
Glucose-Capillary: 159 mg/dL — ABNORMAL HIGH (ref 70–99)
Glucose-Capillary: 145 mg/dL — ABNORMAL HIGH (ref 70–99)

## 2020-05-09 LAB — MAGNESIUM: Magnesium: 1.9 mg/dL (ref 1.7–2.4)

## 2020-05-09 LAB — BASIC METABOLIC PANEL
Anion gap: 9 (ref 5–15)
BUN: 23 mg/dL (ref 8–23)
CO2: 26 mmol/L (ref 22–32)
Calcium: 9.3 mg/dL (ref 8.9–10.3)
Chloride: 104 mmol/L (ref 98–111)
Creatinine, Ser: 0.95 mg/dL (ref 0.44–1.00)
GFR, Estimated: 60 mL/min — ABNORMAL LOW (ref >60)
Glucose, Bld: 108 mg/dL — ABNORMAL HIGH (ref 70–99)
Potassium: 3.8 mmol/L (ref 3.5–5.1)
Sodium: 139 mmol/L (ref 135–145)

## 2020-05-09 LAB — CBC
HCT: 33.6 % — ABNORMAL LOW (ref 36.0–46.0)
Hemoglobin: 10.6 g/dL — ABNORMAL LOW (ref 12.0–15.0)
MCH: 35.2 pg — ABNORMAL HIGH (ref 26.0–34.0)
MCHC: 31.5 g/dL (ref 30.0–36.0)
MCV: 111.6 fL — ABNORMAL HIGH (ref 80.0–100.0)
Platelets: 314 10*3/uL (ref 150–400)
RBC: 3.01 MIL/uL — ABNORMAL LOW (ref 3.87–5.11)
RDW: 13.3 % (ref 11.5–15.5)
WBC: 12.3 10*3/uL — ABNORMAL HIGH (ref 4.0–10.5)
nRBC: 0 % (ref 0.0–0.2)

## 2020-05-09 MED ORDER — HYDROCODONE-ACETAMINOPHEN 5-325 MG PO TABS
2.0000 | ORAL_TABLET | ORAL | Status: DC | PRN
  Administered 2020-05-09 – 2020-05-10 (×3): 2 via ORAL
  Filled 2020-05-09 (×3): qty 2

## 2020-05-09 MED ORDER — FOSFOMYCIN TROMETHAMINE 3 G PO PACK
3.0000 g | PACK | Freq: Once | ORAL | Status: AC
  Administered 2020-05-09: 3 g via ORAL
  Filled 2020-05-09: qty 3

## 2020-05-09 NOTE — Plan of Care (Signed)
  Problem: Education: Goal: Knowledge of General Education information will improve Description: Including pain rating scale, medication(s)/side effects and non-pharmacologic comfort measures Outcome: Progressing   Problem: Clinical Measurements: Goal: Diagnostic test results will improve Outcome: Progressing   Problem: Activity: Goal: Risk for activity intolerance will decrease Outcome: Progressing   

## 2020-05-09 NOTE — Progress Notes (Signed)
Pharmacy Antibiotic Note  Kayla Thompson is a 85 y.o. female admitted on 05/07/2020 with AMS with concerns for UTI. Patient has recent history of ESBL Ecoli bacteremia and UTI and was treated with ertapenem in October 2021.  Afebrile, WBC elevated but improved to 12.3, SCr 0.95, CrCl 34 ml/min.  Plan: D/C Meropenem Fosfomycin 3g packet PO x1 dose   Height: 4\' 11"  (149.9 cm) Weight: 54.3 kg (119 lb 11.4 oz) IBW/kg (Calculated) : 43.2  Temp (24hrs), Avg:98.7 F (37.1 C), Min:98.1 F (36.7 C), Max:99.1 F (37.3 C)  Recent Labs  Lab 05/07/20 1442 05/07/20 1812 05/08/20 0312 05/09/20 0259  WBC 14.1*  --  12.5* 12.3*  CREATININE 0.92  --  0.83 0.95  LATICACIDVEN  --  1.6  --   --     Estimated Creatinine Clearance: 34.3 mL/min (by C-G formula based on SCr of 0.95 mg/dL).    Allergies  Allergen Reactions  . Latex Rash  . Penicillins Rash    Has patient had a PCN reaction causing immediate rash, facial/tongue/throat swelling, SOB or lightheadedness with hypotension: No Has patient had a PCN reaction causing severe rash involving mucus membranes or skin necrosis: No Has patient had a PCN reaction that required hospitalization: No Has patient had a PCN reaction occurring within the last 10 years: No If all of the above answers are "NO", then may proceed with Cephalosporin use.     Antimicrobials this admission: Meropenem 2/16 >> 2/18 Fosfomycin 2/18 x1  Dose adjustments this admission: N/A  Microbiology results: 2/16 BCx: pend 2/16 UCx: NGF    Gretta Arab PharmD, BCPS Clinical Pharmacist [facility] main pharmacy [phone] 05/09/2020 7:20 AM

## 2020-05-09 NOTE — TOC Transition Note (Signed)
Transition of Care [facility]) - CM/SW Discharge Note   Patient Details  Name: Kayla Thompson MRN: [id number] Date of Birth: 05-11-1935  Transition of Care [facility]) CM/SW Contact:  Lennart Pall, LCSW Phone Number: 05/09/2020, 12:12 PM   Clinical Narrative:    Alerted by MD of plan for HHPT.  Met with pt (and left VM for daughter, [name]) who confirms she has had [facility] prior from [place] and would prefer to use them again.  Referral placed with [facility].  Pt has 24/7private duty caregivers.  Anticipating dc home tomorrow. No further TOC needs.   Final next level of care: [place] Barriers to Discharge: Continued Medical Work up   Patient Goals and CMS Choice Patient states their goals for this hospitalization and ongoing recovery are:: return home      Discharge Placement                       Discharge Plan and Services                DME Arranged: N/A DME Agency: NA       HH Arranged: PT HH Agency: [place] Date [place]: 05/09/20 Time [place]: 1212 Representative spoke with at [place]: [place] (SDOH) Interventions     Readmission Risk Interventions Readmission Risk Prevention Plan 05/09/2020  Transportation Screening Complete  PCP or Specialist Appt within 5-7 Days Complete  Home Care Screening Complete  Some recent data might be hidden

## 2020-05-09 NOTE — Progress Notes (Signed)
PROGRESS NOTE    Kayla Thompson  FGH:[id number] DOB: [birth year] DOA: 05/07/2020 PCP: [name], MD    Brief Narrative:  Kayla Thompson is an 85 year old female with past medical history significant for COPD/asthma on chronic prednisone, insulin-dependent type 2 diabetes mellitus, chronic diastolic congestive heart failure, essential hypertension, hyperlipidemia, hypothyroidism, depression/anxiety, chronic pain syndrome, recurrent UTI with history of ESBL E. coli and Klebsiella pneumonia UTI who presented to the ED with confusion that has progressed over the last 2 weeks.  Patient also reports associated dysuria and polyuria.  Additionally, patient reports some loose stools which she attributes to mineral oil which he started taking to help relieve constipation.  In the ED, BP 131/85, HR 100, RR 20, temperature 98.5 F, SPO2 95% on room air.  WBC 14.1, hemoglobin 11.0, platelets 351, sodium 137, potassium 4.1, CO2 20, BUN 20, creatinine 0.92, glucose 177, lipase 26, lactic acid 1.6.  Urinalysis with small leukoesterase, negative nitrites, greater than 50 WBCs and few bacteria.  Urine and blood cultures obtained.  SARS-CoV-2 and influenza a/B PCR negative.  CT head without contrast negative for acute intracranial abnormality but does note generalized cerebral volume loss and moderate chronic small vessel ischemic changes in the cerebral white matter.  Chest x-ray with minimal reticular opacities right costophrenic angle consistent with scarring versus atelectasis; no focal consolidation/pneumothorax/pleural effusion.  Patient was given 1 L LR bolus and started on IV meropenem.  Hospital service consulted for further evaluation and management of acute metabolic encephalopathy secondary to UTI.   Assessment & Plan:   Principal Problem:   Sepsis due to urinary tract infection ([place]) Active Problems:   Hypothyroidism   Asthma, chronic   Type 2 diabetes mellitus with hyperglycemia, with  long-term current use of insulin (HCC)   Diastolic CHF, chronic (HCC)   Acute metabolic encephalopathy   Sepsis ([place])   Hypertension associated with diabetes ([place])   Hyperlipidemia associated with type 2 diabetes mellitus ([place])   Depression with anxiety   Sepsis secondary to UTI, POA Acute metabolic encephalopathy, POA Patient presented to the ED with confusion, elevated heart rate, leukocytosis with urinalysis suggestive of urinary tract infection.  CT head and chest x-ray unrevealing.  History of recurrent UTIs, most recently with ESBL E. coli and Klebsiella pneumonia.  Urine culture with no growth.  Patient received 2 days of IV meropenem followed by 1 dose of fosfomycin. --Blood cultures x2: No growth to date  COPD/Asthma, Nonoxygen dependent. --Continue Trelegy, Singulair, as needed albuterol.   --Hold home prednisone 2 mg daily for now --Continue SPO2 checks with vital signs per unit routine  Insulin-dependent type 2 diabetes mellitus Hemoglobin A1c 5.9 05/08/2020, well controlled. --Continue home Lantus 15 subcutaneously daily --Sensitive SSI for further coverage --CBGs qAC/HS  Essential hypertension Currently off antihypertensives outpatient. --Continue monitor BP closely  Hyperlipidemia: Continue crestor  Hypothyroidism: TSH 0.681 on 05/07/2020. --Continue levothyroxine 50 mcg p.o. daily  Depression/anxiety: --Cymbalta 90 mg p.o. daily --Elavil 10 mg p.o. nightly --BuSpar 5 mg p.o. twice daily  Chronic pain syndrome: --Continue home Norco  Weakness: Patient currently resides at home alone with caregiver during the day. --OT, no needs identified --PT recommends home health PT, home health orders placed   DVT prophylaxis: Lovenox   Code Status: DNR Family Communication: Updated patient's daughter, [name] via telephone this morning, would like patient to stay until tomorrow until she has a home health aide set up and to ensure blood cultures remain  negative  Disposition Plan:  Level  of care: Med-Surg Status is: Observation  The patient remains OBS appropriate and will d/c before 2 midnights.  Dispo: The patient is from: Home              Anticipated d/c is to: Home              Anticipated d/c date is: 2 days              Patient currently is not medically stable to d/c.   Difficult to place patient No   Consultants:   None  Procedures:   None  Antimicrobials:   Meropenem 2/16>>   Subjective: Patient seen and examined at bedside, resting comfortably.  Patient now back to her normal baseline.  Urine culture with no growth, discontinued meropenem after 2 days and received 1 dose of IV fosfomycin today.  Updated patient's daughter, [name] via telephone; she is concerned about discharge today and would like 1 more day to ensure blood cultures remain negative and she is able to set up home health aide for her mother's return.  No other questions or concerns at this time.  Patient with no other concerns at this time.  Denies headache, no visual changes, no chest pain, no palpitations, no shortness of breath, no abdominal pain, no weakness, no fatigue, no paresthesias.  No acute events overnight per nursing staff.  Objective: Vitals:   05/08/20 1949 05/08/20 2208 05/09/20 0720 05/09/20 0818  BP:  (!) 145/72 (!) 153/88   Pulse:  85 83   Resp:  16 16   Temp:  98.8 F (37.1 C) 97.9 F (36.6 C)   TempSrc:  Oral Oral   SpO2: 96% 99% 97% 97%  Weight:      Height:        Intake/Output Summary (Last 24 hours) at 05/09/2020 1111 Last data filed at 05/09/2020 1029 Gross per 24 hour  Intake 1040 ml  Output 1800 ml  Net -760 ml   Filed Weights   05/07/20 1429 05/07/20 2300  Weight: 64.4 kg 54.3 kg    Examination:  General exam: Appears calm and comfortable, elderly in appearance Respiratory system: Clear to auscultation. Respiratory effort normal.  Oxygenating well on room air Cardiovascular system: S1 & S2 heard, RRR. No  JVD, murmurs, rubs, gallops or clicks. No pedal edema. Gastrointestinal system: Abdomen is nondistended, soft and nontender. No organomegaly or masses felt. Normal bowel sounds heard. Central nervous system: Alert and oriented to person/place/time/situation. No focal neurological deficits. Extremities: Symmetric 5 x 5 power. Skin: No rashes, lesions or ulcers Psychiatry: Judgement and insight appear normal. Mood & affect appropriate.     Data Reviewed: I have personally reviewed following labs and imaging studies  CBC: Recent Labs  Lab 05/07/20 1442 05/08/20 0312 05/09/20 0259  WBC 14.1* 12.5* 12.3*  NEUTROABS 9.2*  --   --   HGB 11.0* 10.5* 10.6*  HCT 33.6* 33.3* 33.6*  MCV 107.7* 111.0* 111.6*  PLT 351 310 403   Basic Metabolic Panel: Recent Labs  Lab 05/07/20 1442 05/08/20 0312 05/09/20 0259  NA 137 139 139  K 4.1 3.7 3.8  CL 106 107 104  CO2 20* 20* 26  GLUCOSE 177* 88 108*  BUN 20 15 23   CREATININE 0.92 0.83 0.95  CALCIUM 8.9 9.0 9.3  MG  --   --  1.9   GFR: Estimated Creatinine Clearance: 34.3 mL/min (by C-G formula based on SCr of 0.95 mg/dL). Liver Function Tests: Recent Labs  Lab 05/07/20 1442  AST 16  ALT 16  ALKPHOS 31*  BILITOT 0.4  PROT 6.4*  ALBUMIN 3.2*   Recent Labs  Lab 05/07/20 1634  LIPASE 26   No results for input(s): AMMONIA in the last 168 hours. Coagulation Profile: No results for input(s): INR, PROTIME in the last 168 hours. Cardiac Enzymes: No results for input(s): CKTOTAL, CKMB, CKMBINDEX, TROPONINI in the last 168 hours. BNP (last 3 results) Recent Labs    11/20/19 1549 01/25/20 1147  PROBNP 58.0 103.0*   HbA1C: Recent Labs    05/08/20 0312  HGBA1C 5.9*   CBG: Recent Labs  Lab 05/08/20 0729 05/08/20 1213 05/08/20 1615 05/08/20 2211 05/09/20 0754  GLUCAP 81 250* 104* 139* 89   Lipid Profile: No results for input(s): CHOL, HDL, LDLCALC, TRIG, CHOLHDL, LDLDIRECT in the last 72 hours. Thyroid Function  Tests: Recent Labs    05/07/20 1634  TSH 0.681   Anemia Panel: No results for input(s): VITAMINB12, FOLATE, FERRITIN, TIBC, IRON, RETICCTPCT in the last 72 hours. Sepsis Labs: Recent Labs  Lab 05/07/20 1812 05/08/20 0312  PROCALCITON  --  0.58  LATICACIDVEN 1.6  --     Recent Results (from the past 240 hour(s))  Urine culture     Status: None   Collection Time: 05/07/20  4:22 PM   Specimen: Urine, Random  Result Value Ref Range Status   Specimen Description   Final    URINE, RANDOM Performed at [facility], [address] [place], [place] [address]    Special Requests   Final    NONE Performed at [facility], [address] [place], [place] [address]    Culture   Final    NO GROWTH Performed at [facility], [place] [address] [place], [place] [address]    Report Status 05/08/2020 FINAL  Final  SARS CORONAVIRUS 2 (TAT 6-24 HRS) Nasopharyngeal Nasopharyngeal Swab     Status: None   Collection Time: 05/07/20  4:34 PM   Specimen: Nasopharyngeal Swab  Result Value Ref Range Status   SARS Coronavirus 2 NEGATIVE NEGATIVE Final    Comment: (NOTE) SARS-CoV-2 target nucleic acids are NOT DETECTED.  The SARS-CoV-2 RNA is generally detectable in upper and lower respiratory specimens during the acute phase of infection. Negative results do not preclude SARS-CoV-2 infection, do not rule out co-infections with other pathogens, and should not be used as the sole basis for treatment or other patient management decisions. Negative results must be combined with clinical observations, patient history, and epidemiological information. The expected result is Negative.  Fact Sheet for Patients: SugarRoll.be  Fact Sheet for Healthcare Providers: https://www.woods-mathews.com/  This test is not yet approved or cleared by the [place] FDA and  has been authorized for detection and/or diagnosis of SARS-CoV-2  by FDA under an Emergency Use Authorization (EUA). This EUA will remain  in effect (meaning this test can be used) for the duration of the COVID-19 declaration under Se ction 564(b)(1) of the Act, 21 U.S.C. section 360bbb-3(b)(1), unless the authorization is terminated or revoked sooner.  Performed at [facility], [place] [address] [place], [place] [address]   Resp Panel by RT-PCR (Flu A&B, Covid) Nasopharyngeal Swab     Status: None   Collection Time: 05/07/20  4:34 PM   Specimen: Nasopharyngeal Swab; Nasopharyngeal(NP) swabs in vial transport medium  Result Value Ref Range Status   SARS Coronavirus 2 by RT PCR NEGATIVE NEGATIVE Final    Comment: (  NOTE) SARS-CoV-2 target nucleic acids are NOT DETECTED.  The SARS-CoV-2 RNA is generally detectable in upper respiratory specimens during the acute phase of infection. The lowest concentration of SARS-CoV-2 viral copies this assay can detect is 138 copies/mL. A negative result does not preclude SARS-Cov-2 infection and should not be used as the sole basis for treatment or other patient management decisions. A negative result may occur with  improper specimen collection/handling, submission of specimen other than nasopharyngeal swab, presence of viral mutation(s) within the areas targeted by this assay, and inadequate number of viral copies(<138 copies/mL). A negative result must be combined with clinical observations, patient history, and epidemiological information. The expected result is Negative.  Fact Sheet for Patients:  EntrepreneurPulse.com.au  Fact Sheet for Healthcare Providers:  IncredibleEmployment.be  This test is no t yet approved or cleared by the [place] FDA and  has been authorized for detection and/or diagnosis of SARS-CoV-2 by FDA under an Emergency Use Authorization (EUA). This EUA will remain  in effect (meaning this test can be used) for the duration of the COVID-19  declaration under Section 564(b)(1) of the Act, 21 U.S.C.section 360bbb-3(b)(1), unless the authorization is terminated  or revoked sooner.       Influenza A by PCR NEGATIVE NEGATIVE Final   Influenza B by PCR NEGATIVE NEGATIVE Final    Comment: (NOTE) The Xpert Xpress SARS-CoV-2/FLU/RSV plus assay is intended as an aid in the diagnosis of influenza from Nasopharyngeal swab specimens and should not be used as a sole basis for treatment. Nasal washings and aspirates are unacceptable for Xpert Xpress SARS-CoV-2/FLU/RSV testing.  Fact Sheet for Patients: EntrepreneurPulse.com.au  Fact Sheet for Healthcare Providers: IncredibleEmployment.be  This test is not yet approved or cleared by the [place] FDA and has been authorized for detection and/or diagnosis of SARS-CoV-2 by FDA under an Emergency Use Authorization (EUA). This EUA will remain in effect (meaning this test can be used) for the duration of the COVID-19 declaration under Section 564(b)(1) of the Act, 21 U.S.C. section 360bbb-3(b)(1), unless the authorization is terminated or revoked.  Performed at [facility], [place] [address] [place], [place] [address]   Culture, blood (routine x 2)     Status: None (Preliminary result)   Collection Time: 05/07/20  6:00 PM   Specimen: BLOOD  Result Value Ref Range Status   Specimen Description   Final    BLOOD LEFT ANTECUBITAL Performed at [facility], [place] [address], [place], [place] [address]    Special Requests   Final    BOTTLES DRAWN AEROBIC AND ANAEROBIC Blood Culture adequate volume Performed at [facility], [address] [place], [place] [address]    Culture PENDING  Incomplete   Report Status PENDING  Incomplete  Culture, blood (routine x 2)     Status: None (Preliminary result)   Collection Time: 05/07/20  6:15 PM   Specimen: BLOOD  Result Value Ref Range Status   Specimen Description   Final     BLOOD RIGHT ANTECUBITAL Performed at [facility], [place] [address], [place], [place] [address]    Special Requests   Final    BOTTLES DRAWN AEROBIC AND ANAEROBIC Blood Culture adequate volume Performed at [facility], [address] [place], [place] [address]    Culture PENDING  Incomplete   Report Status PENDING  Incomplete         Radiology Studies: CT Head Wo Contrast  Result Date:  05/07/2020 CLINICAL DATA:  Mental status change. Confusion. Memory loss. Slurred speech for 2 weeks. No reported injury. EXAM: CT HEAD WITHOUT CONTRAST TECHNIQUE: Contiguous axial images were obtained from the base of the skull through the vertex without intravenous contrast. COMPARISON:  01/12/2020 head CT. FINDINGS: Brain: No evidence of parenchymal hemorrhage or extra-axial fluid collection. No mass lesion, mass effect, or midline shift. No CT evidence of acute infarction. Generalized cerebral volume loss. Nonspecific moderate subcortical and periventricular white matter hypodensity, most in keeping with chronic small vessel ischemic change. No ventriculomegaly. Vascular: No acute abnormality. Skull: No evidence of calvarial fracture. Sinuses/Orbits: The visualized paranasal sinuses are essentially clear. Other:  The mastoid air cells are unopacified. IMPRESSION: 1. No evidence of acute intracranial abnormality. 2. Generalized cerebral volume loss and moderate chronic small vessel ischemic changes in the cerebral white matter. Electronically Signed   By: Ilona Sorrel M.D.   On: 05/07/2020 17:13   DG Chest Port 1 View  Result Date: 05/07/2020 CLINICAL DATA:  Confusion, slurred speech, memory loss for 2 weeks EXAM: PORTABLE CHEST 1 VIEW COMPARISON:  01/25/2020 chest radiograph. FINDINGS: Stable cardiomediastinal silhouette with normal heart size. No pneumothorax. No pleural effusion. Minimal reticular opacities at the right costophrenic angle. No pulmonary edema. No acute consolidative airspace  disease. Healed deformity in the lateral left fourth rib. IMPRESSION: Minimal reticular opacities at the right costophrenic angle, favor scarring or atelectasis. Otherwise no active disease in the chest. Electronically Signed   By: Ilona Sorrel M.D.   On: 05/07/2020 17:14        Scheduled Meds: . amitriptyline  10 mg Oral QHS  . busPIRone  5 mg Oral BID  . DULoxetine  90 mg Oral Daily  . enoxaparin (LOVENOX) injection  40 mg Subcutaneous Q24H  . feeding supplement  237 mL Oral BID BM  . insulin aspart  0-9 Units Subcutaneous TID WC  . insulin glargine  15 Units Subcutaneous Daily  . levothyroxine  50 mcg Oral Q0600  . mometasone-formoterol  2 puff Inhalation BID   And  . umeclidinium bromide  1 puff Inhalation Daily  . montelukast  10 mg Oral Daily  . pantoprazole  40 mg Oral Daily  . rosuvastatin  10 mg Oral QPM   Continuous Infusions:    LOS: 1 day    Time spent: 35 minutes spent on chart review, discussion with nursing staff, consultants, updating family and interview/physical exam; more than 50% of that time was spent in counseling and/or coordination of care.    Abrey Bradway J [place] DO [facility] Available via Epic secure chat 7am-7pm After these hours, please refer to coverage provider listed on [email or web address] 05/09/2020, 11:11 AM

## 2020-05-10 DIAGNOSIS — N390 Urinary tract infection, site not specified: Secondary | ICD-10-CM | POA: Diagnosis not present

## 2020-05-10 DIAGNOSIS — A419 Sepsis, unspecified organism: Secondary | ICD-10-CM | POA: Diagnosis not present

## 2020-05-10 LAB — CBC
HCT: 38.5 % (ref 36.0–46.0)
Hemoglobin: 12 g/dL (ref 12.0–15.0)
MCH: 35.7 pg — ABNORMAL HIGH (ref 26.0–34.0)
MCHC: 31.2 g/dL (ref 30.0–36.0)
MCV: 114.6 fL — ABNORMAL HIGH (ref 80.0–100.0)
Platelets: 257 10*3/uL (ref 150–400)
RBC: 3.36 MIL/uL — ABNORMAL LOW (ref 3.87–5.11)
RDW: 13.3 % (ref 11.5–15.5)
WBC: 12.4 10*3/uL — ABNORMAL HIGH (ref 4.0–10.5)
nRBC: 0 % (ref 0.0–0.2)

## 2020-05-10 LAB — GLUCOSE, CAPILLARY
Glucose-Capillary: 86 mg/dL (ref 70–99)
Glucose-Capillary: 137 mg/dL — ABNORMAL HIGH (ref 70–99)

## 2020-05-10 NOTE — Discharge Summary (Signed)
Physician Discharge Summary  Kayla Thompson IOE:[id number] DOB: 06-17-1935 DOA: 05/07/2020  PCP: [name], MD  Admit date: 05/07/2020 Discharge date: 05/10/2020  Admitted From: Home Disposition: Home  Recommendations for Outpatient Follow-up:  1. Follow up with PCP in 1-2 weeks 2. Follow-up with urology in 1-2 weeks 3. Please obtain CBC in one week to ensure leukocytosis resolves 4. Please follow up on the following pending results: Finalized blood cultures, negative to date at time of discharge  Home Health: PT Equipment/Devices: None  Discharge Condition: Stable CODE STATUS: DNR Diet recommendation: Heart healthy/consistent carbohydrate diet  History of present illness:  Kayla Thompson is an 85 year old female with past medical history significant for COPD/asthma on chronic prednisone, insulin-dependent type 2 diabetes mellitus, chronic diastolic congestive heart failure, essential hypertension, hyperlipidemia, hypothyroidism, depression/anxiety, chronic pain syndrome, recurrent UTI with history of ESBL E. coli and Klebsiella pneumonia UTI who presented to the ED with confusion that has progressed over the last 2 weeks.  Patient also reports associated dysuria and polyuria.  Additionally, patient reports some loose stools which she attributes to mineral oil which he started taking to help relieve constipation.  In the ED, BP 131/85, HR 100, RR 20, temperature 98.5 F, SPO2 95% on room air.  WBC 14.1, hemoglobin 11.0, platelets 351, sodium 137, potassium 4.1, CO2 20, BUN 20, creatinine 0.92, glucose 177, lipase 26, lactic acid 1.6.  Urinalysis with small leukoesterase, negative nitrites, greater than 50 WBCs and few bacteria.  Urine and blood cultures obtained.  SARS-CoV-2 and influenza a/B PCR negative.  CT head without contrast negative for acute intracranial abnormality but does note generalized cerebral volume loss and moderate chronic small vessel ischemic changes in the  cerebral white matter.  Chest x-ray with minimal reticular opacities right costophrenic angle consistent with scarring versus atelectasis; no focal consolidation/pneumothorax/pleural effusion.  Patient was given 1 L LR bolus and started on IV meropenem.  Hospital service consulted for further evaluation and management of acute metabolic encephalopathy secondary to UTI.  Hospital course:  Sepsis secondary to UTI, POA Acute metabolic encephalopathy, POA Patient presented to the ED with confusion, elevated heart rate, leukocytosis with urinalysis suggestive of urinary tract infection.  CT head and chest x-ray unrevealing.  History of recurrent UTIs, most recently with ESBL E. coli and Klebsiella pneumonia.  Urine culture with no growth.  Patient received 2 days of IV meropenem followed by 1 dose of fosfomycin. Blood cultures remain negative to date.  Patient mental status now at baseline over the past 48 hours.  Outpatient follow-up with PCP and neurology.  COPD/Asthma, Nonoxygen dependent. Continue Trelegy, Singulair, as needed albuterol. Continue home prednisone 2 mg daily.  Insulin-dependent type 2 diabetes mellitus Hemoglobin A1c 5.9 05/08/2020, well controlled. Continue home Lantus 15 subcutaneously daily  Essential hypertension Currently off antihypertensives outpatient.  Hyperlipidemia: Continue crestor  Hypothyroidism: TSH 0.681 on 05/07/2020. Continue levothyroxine 50 mcg p.o. daily  Depression/anxiety: Cymbalta 90 mg p.o. daily, Elavil 10 mg p.o. nightly, BuSpar 5 mg p.o. twice daily  Chronic pain syndrome: Continue home Norco  Weakness: Patient currently resides at home alone with caregiver during the day.  PT recommends home health.  Family arranging aide.  Discharge Diagnoses:  Active Problems:   Hypothyroidism   Asthma, chronic   Type 2 diabetes mellitus with hyperglycemia, with long-term current use of insulin (HCC)   Diastolic CHF, chronic (HCC)   Acute  metabolic encephalopathy   Hypertension associated with diabetes ([place])   Hyperlipidemia associated with type 2  diabetes mellitus ([place])   Depression with anxiety    Discharge Instructions  Discharge Instructions    Call MD for:  difficulty breathing, headache or visual disturbances   Complete by: As directed    Call MD for:  extreme fatigue   Complete by: As directed    Call MD for:  persistant dizziness or light-headedness   Complete by: As directed    Call MD for:  persistant nausea and vomiting   Complete by: As directed    Call MD for:  severe uncontrolled pain   Complete by: As directed    Call MD for:  temperature >100.4   Complete by: As directed    Diet - low sodium heart healthy   Complete by: As directed    Increase activity slowly   Complete by: As directed    No wound care   Complete by: As directed      Allergies as of 05/10/2020      Reactions   Latex Rash   Penicillins Rash   Has patient had a PCN reaction causing immediate rash, facial/tongue/throat swelling, SOB or lightheadedness with hypotension: No Has patient had a PCN reaction causing severe rash involving mucus membranes or skin necrosis: No Has patient had a PCN reaction that required hospitalization: No Has patient had a PCN reaction occurring within the last 10 years: No If all of the above answers are "NO", then may proceed with Cephalosporin use.      Medication List    TAKE these medications   albuterol 108 (90 Base) MCG/ACT inhaler Commonly known as: ProAir HFA Inhale 2 puffs into the lungs every 6 (six) hours as needed for wheezing or shortness of breath.   amitriptyline 10 MG tablet Commonly known as: ELAVIL TAKE 1 TABLET BY MOUTH EVERYDAY AT BEDTIME What changed: See the new instructions.   azithromycin 250 MG tablet Commonly known as: ZITHROMAX 2 today then one daily   baclofen 10 MG tablet Commonly known as: LIORESAL TAKE 0.5-1 TABLET BY MOUTH TWICE DAILY AS NEEDED FOR MUSCLE  SPASMS OR PAIN What changed: See the new instructions.   BD Pen Needle Nano 2nd Gen 32G X 4 MM Misc Generic drug: Insulin Pen Needle USE AS DIRECTED 4 TIMES A DAY   busPIRone 5 MG tablet Commonly known as: BUSPAR TAKE 1 TO 2 TABLETS BY MOUTH TWICE A DAY What changed: See the new instructions.   clotrimazole-betamethasone cream Commonly known as: LOTRISONE APPLY TO AFFECTED AREA TWICE A DAY What changed: See the new instructions.   DULoxetine 30 MG capsule Commonly known as: CYMBALTA Take 1 capsule (30 mg total) by mouth daily. Plan to see her PCP for further refills What changed: Another medication with the same name was changed. Make sure you understand how and when to take each.   DULoxetine 60 MG capsule Commonly known as: CYMBALTA TAKE 1 CAPSULE BY MOUTH EVERY DAY What changed: how much to take   feeding supplement Liqd Take 237 mLs by mouth 2 (two) times daily between meals.   ferrous gluconate 324 MG tablet Commonly known as: FERGON TAKE 1 TABLET BY MOUTH EVERY DAY WITH BREAKFAST   fluconazole 100 MG tablet Commonly known as: Diflucan Take 1 tablet (100 mg total) by mouth daily.   freestyle lancets Use to test blood sugar 3 times daily. Dx: E11.65   FREESTYLE LITE test strip Generic drug: glucose blood USE TO TEST BLOOD SUGAR 3 TIMES DAILY. DX: E11.65   HYDROcodone-acetaminophen 10-325 MG tablet  Commonly known as: NORCO Take 1 tablet by mouth every 4 (four) hours as needed for severe pain.   ipratropium-albuterol 0.5-2.5 (3) MG/3ML Soln Commonly known as: DUONEB Inhale 3 mLs into the lungs 3 (three) times daily as needed (shortness of breath). USE 1 VIAL IN NEBULIZER 3 TIMES DAILY   Lantus SoloStar 100 UNIT/ML Solostar Pen Generic drug: insulin glargine Inject 15 Units into the skin daily.   levothyroxine 50 MCG tablet Commonly known as: SYNTHROID TAKE 1 TABLET BY MOUTH EVERY DAY BEFORE BREAKFAST What changed: See the new instructions.   lidocaine 5  % ointment Commonly known as: XYLOCAINE Apply 1 application topically as needed. Apply to external vulvar area   magnesium oxide 400 MG tablet Commonly known as: MAG-OX TAKE 1 TABLET BY MOUTH EVERY DAY   montelukast 10 MG tablet Commonly known as: SINGULAIR TAKE 1 TABLET BY MOUTH EVERY DAY   omeprazole 20 MG capsule Commonly known as: PRILOSEC TAKE 1 CAPSULE BY MOUTH 2 TIMES A DAY BEFORE A MEAL What changed: See the new instructions.   predniSONE 1 MG tablet Commonly known as: DELTASONE Take 2 tablets (2 mg total) by mouth daily.   rosuvastatin 10 MG tablet Commonly known as: CRESTOR Take 1 tablet (10 mg total) by mouth every evening.   TART CHERRY ADVANCED PO Take by mouth. Once daily   Trelegy Ellipta 100-62.5-25 MCG/INH Aepb Generic drug: Fluticasone-Umeclidin-Vilant Inhale 1 puff into the lungs daily. Rinse mouth   UNABLE TO FIND Take 1,000 mg by mouth daily. Med Name: [facility] [place] Follow up.   Specialty: Home Health Services Why: to provide home health physical therapy Contact information: [place] [address] [phone]        [name], MD. Schedule an appointment as soon as possible for a visit in 1 week(s).   Specialty: Internal Medicine Contact information: [place] [address] [phone]              Allergies  Allergen Reactions  . Latex Rash  . Penicillins Rash    Has patient had a PCN reaction causing immediate rash, facial/tongue/throat swelling, SOB or lightheadedness with hypotension: No Has patient had a PCN reaction causing severe rash involving mucus membranes or skin necrosis: No Has patient had a PCN reaction that required hospitalization: No Has patient had a PCN reaction occurring within the last 10 years: No If all of the above answers are "NO", then may proceed with Cephalosporin use.      Consultations:  none   Procedures/Studies: CT Head Wo Contrast  Result Date: 05/07/2020 CLINICAL DATA:  Mental status change. Confusion. Memory loss. Slurred speech for 2 weeks. No reported injury. EXAM: CT HEAD WITHOUT CONTRAST TECHNIQUE: Contiguous axial images were obtained from the base of the skull through the vertex without intravenous contrast. COMPARISON:  01/12/2020 head CT. FINDINGS: Brain: No evidence of parenchymal hemorrhage or extra-axial fluid collection. No mass lesion, mass effect, or midline shift. No CT evidence of acute infarction. Generalized cerebral volume loss. Nonspecific moderate subcortical and periventricular white matter hypodensity, most in keeping with chronic small vessel ischemic change. No ventriculomegaly. Vascular: No acute abnormality. Skull: No evidence of calvarial fracture. Sinuses/Orbits: The visualized paranasal sinuses are essentially clear. Other:  The mastoid air cells are unopacified. IMPRESSION: 1. No evidence of acute intracranial abnormality. 2. Generalized cerebral volume loss and moderate chronic small vessel ischemic  changes in the cerebral white matter. Electronically Signed   By: Ilona Sorrel M.D.   On: 05/07/2020 17:13   DG Chest Port 1 View  Result Date: 05/07/2020 CLINICAL DATA:  Confusion, slurred speech, memory loss for 2 weeks EXAM: PORTABLE CHEST 1 VIEW COMPARISON:  01/25/2020 chest radiograph. FINDINGS: Stable cardiomediastinal silhouette with normal heart size. No pneumothorax. No pleural effusion. Minimal reticular opacities at the right costophrenic angle. No pulmonary edema. No acute consolidative airspace disease. Healed deformity in the lateral left fourth rib. IMPRESSION: Minimal reticular opacities at the right costophrenic angle, favor scarring or atelectasis. Otherwise no active disease in the chest. Electronically Signed   By: Ilona Sorrel M.D.   On: 05/07/2020 17:14      Subjective: Patient seen and examined bedside,  resting comfortably.  No complaints this morning.  Mental status remains at baseline over the past 48 hours.  Updated patient's daughter via telephone regarding discharge home today.  Patient feels ready for discharge as well.  Denies headache, no fever/chills/night sweats, no nausea/vomiting/diarrhea, no chest pain, palpitations, shortness of breath, no abdominal pain, no weakness, fatigue, paresthesias.  No acute events overnight per nursing staff.  Discharge Exam: Vitals:   05/10/20 0522 05/10/20 0849  BP: (!) 156/78   Pulse: 90   Resp: 18   Temp: 98.2 F (36.8 C)   SpO2: 98% 100%   Vitals:   05/09/20 2130 05/09/20 2158 05/10/20 0522 05/10/20 0849  BP: 134/60  (!) 156/78   Pulse: 90  90   Resp: (!) 24 20 18    Temp: 98.3 F (36.8 C)  98.2 F (36.8 C)   TempSrc: Oral  Oral   SpO2: 97%  98% 100%  Weight:      Height:        General: Pt is alert, awake, not in acute distress, elderly in appearance Cardiovascular: RRR, S1/S2 +, no rubs, no gallops Respiratory: CTA bilaterally, no wheezing, no rhonchi, oxygenating well on room air Abdominal: Soft, NT, ND, bowel sounds + Extremities: no edema, no cyanosis    The results of significant diagnostics from this hospitalization (including imaging, microbiology, ancillary and laboratory) are listed below for reference.     Microbiology: Recent Results (from the past 240 hour(s))  Urine culture     Status: None   Collection Time: 05/07/20  4:22 PM   Specimen: Urine, Random  Result Value Ref Range Status   Specimen Description   Final    URINE, RANDOM Performed at [facility], [address] [place], [place] [address]    Special Requests   Final    NONE Performed at [facility], [address] [place], [place] [address]    Culture   Final    NO GROWTH Performed at [facility], [place] [address] [place],  [address]    Report Status 05/08/2020 FINAL  Final  SARS CORONAVIRUS 2 (TAT 6-24  HRS) Nasopharyngeal Nasopharyngeal Swab     Status: None   Collection Time: 05/07/20  4:34 PM   Specimen: Nasopharyngeal Swab  Result Value Ref Range Status   SARS Coronavirus 2 NEGATIVE NEGATIVE Final    Comment: (NOTE) SARS-CoV-2 target nucleic acids are NOT DETECTED.  The SARS-CoV-2 RNA is generally detectable in upper and lower respiratory specimens during the acute phase of infection. Negative results do not preclude SARS-CoV-2 infection, do not rule out co-infections with other pathogens, and should not be used as the sole basis for treatment  or other patient management decisions. Negative results must be combined with clinical observations, patient history, and epidemiological information. The expected result is Negative.  Fact Sheet for Patients: SugarRoll.be  Fact Sheet for Healthcare Providers: https://www.woods-mathews.com/  This test is not yet approved or cleared by the [place] FDA and  has been authorized for detection and/or diagnosis of SARS-CoV-2 by FDA under an Emergency Use Authorization (EUA). This EUA will remain  in effect (meaning this test can be used) for the duration of the COVID-19 declaration under Se ction 564(b)(1) of the Act, 21 U.S.C. section 360bbb-3(b)(1), unless the authorization is terminated or revoked sooner.  Performed at [facility], [place] [address], [place], [place] [address]   Resp Panel by RT-PCR (Flu A&B, Covid) Nasopharyngeal Swab     Status: None   Collection Time: 05/07/20  4:34 PM   Specimen: Nasopharyngeal Swab; Nasopharyngeal(NP) swabs in vial transport medium  Result Value Ref Range Status   SARS Coronavirus 2 by RT PCR NEGATIVE NEGATIVE Final    Comment: (NOTE) SARS-CoV-2 target nucleic acids are NOT DETECTED.  The SARS-CoV-2 RNA is generally detectable in upper respiratory specimens during the acute phase of infection. The lowest concentration of SARS-CoV-2 viral copies  this assay can detect is 138 copies/mL. A negative result does not preclude SARS-Cov-2 infection and should not be used as the sole basis for treatment or other patient management decisions. A negative result may occur with  improper specimen collection/handling, submission of specimen other than nasopharyngeal swab, presence of viral mutation(s) within the areas targeted by this assay, and inadequate number of viral copies(<138 copies/mL). A negative result must be combined with clinical observations, patient history, and epidemiological information. The expected result is Negative.  Fact Sheet for Patients:  EntrepreneurPulse.com.au  Fact Sheet for Healthcare Providers:  IncredibleEmployment.be  This test is no t yet approved or cleared by the [place] FDA and  has been authorized for detection and/or diagnosis of SARS-CoV-2 by FDA under an Emergency Use Authorization (EUA). This EUA will remain  in effect (meaning this test can be used) for the duration of the COVID-19 declaration under Section 564(b)(1) of the Act, 21 U.S.C.section 360bbb-3(b)(1), unless the authorization is terminated  or revoked sooner.       Influenza A by PCR NEGATIVE NEGATIVE Final   Influenza B by PCR NEGATIVE NEGATIVE Final    Comment: (NOTE) The Xpert Xpress SARS-CoV-2/FLU/RSV plus assay is intended as an aid in the diagnosis of influenza from Nasopharyngeal swab specimens and should not be used as a sole basis for treatment. Nasal washings and aspirates are unacceptable for Xpert Xpress SARS-CoV-2/FLU/RSV testing.  Fact Sheet for Patients: EntrepreneurPulse.com.au  Fact Sheet for Healthcare Providers: IncredibleEmployment.be  This test is not yet approved or cleared by the [place] FDA and has been authorized for detection and/or diagnosis of SARS-CoV-2 by FDA under an Emergency Use Authorization (EUA). This EUA  will remain in effect (meaning this test can be used) for the duration of the COVID-19 declaration under Section 564(b)(1) of the Act, 21 U.S.C. section 360bbb-3(b)(1), unless the authorization is terminated or revoked.  Performed at [facility], [place] [address], [place], [place] [address]   Culture, blood (routine x 2)     Status: None (Preliminary result)   Collection Time: 05/07/20  6:00 PM   Specimen: BLOOD  Result Value Ref Range Status   Specimen Description   Final    BLOOD LEFT ANTECUBITAL Performed at [facility], [address] [place], [place] [address]    Special Requests   Final    BOTTLES DRAWN AEROBIC AND ANAEROBIC Blood Culture adequate volume Performed at [facility], [address] [place], [place] [address]    Culture   Final    NO GROWTH 2 DAYS Performed at [facility], [place] [address], [place], [place] [address]    Report Status PENDING  Incomplete  Culture, blood (routine x 2)     Status: None (Preliminary result)   Collection Time: 05/07/20  6:15 PM   Specimen: BLOOD  Result Value Ref Range Status   Specimen Description   Final    BLOOD RIGHT ANTECUBITAL Performed at [facility], [place] [address], [place], [place] [address]    Special Requests   Final    BOTTLES DRAWN AEROBIC AND ANAEROBIC Blood Culture adequate volume Performed at [facility], [address] [place], [place] [address]    Culture   Final    NO GROWTH 2 DAYS Performed at [facility], [place] [address] [place], [place] [address]    Report Status PENDING  Incomplete     Labs: BNP (last 3 results) Recent Labs    12/07/19 1440 01/12/20 1048  BNP 31 099.8*   Basic Metabolic Panel: Recent Labs  Lab 05/07/20 1442 05/08/20 0312 05/09/20 0259  NA 137 139 139  K 4.1 3.7 3.8  CL 106 107 104  CO2 20* 20* 26  GLUCOSE 177* 88 108*  BUN 20 15 23   CREATININE 0.92 0.83 0.95  CALCIUM 8.9 9.0 9.3  MG  --   --  1.9   Liver  Function Tests: Recent Labs  Lab 05/07/20 1442  AST 16  ALT 16  ALKPHOS 31*  BILITOT 0.4  PROT 6.4*  ALBUMIN 3.2*   Recent Labs  Lab 05/07/20 1634  LIPASE 26   No results for input(s): AMMONIA in the last 168 hours. CBC: Recent Labs  Lab 05/07/20 1442 05/08/20 0312 05/09/20 0259 05/10/20 0347  WBC 14.1* 12.5* 12.3* 12.4*  NEUTROABS 9.2*  --   --   --   HGB 11.0* 10.5* 10.6* 12.0  HCT 33.6* 33.3* 33.6* 38.5  MCV 107.7* 111.0* 111.6* 114.6*  PLT 351 310 314 257   Cardiac Enzymes: No results for input(s): CKTOTAL, CKMB, CKMBINDEX, TROPONINI in the last 168 hours. BNP: Invalid input(s): POCBNP CBG: Recent Labs  Lab 05/09/20 0754 05/09/20 1144 05/09/20 1631 05/09/20 2127 05/10/20 0739  GLUCAP 89 118* 159* 145* 86   D-Dimer No results for input(s): DDIMER in the last 72 hours. Hgb A1c Recent Labs    05/08/20 0312  HGBA1C 5.9*   Lipid Profile No results for input(s): CHOL, HDL, LDLCALC, TRIG, CHOLHDL, LDLDIRECT in the last 72 hours. Thyroid function studies Recent Labs    05/07/20 1634  TSH 0.681   Anemia work up No results for input(s): VITAMINB12, FOLATE, FERRITIN, TIBC, IRON, RETICCTPCT in the last 72 hours. Urinalysis    Component Value Date/Time   COLORURINE COLORLESS (A) 05/07/2020 1622   APPEARANCEUR CLOUDY (A) 05/07/2020 1622   LABSPEC 1.030 05/07/2020 1622   PHURINE 6.0 05/07/2020 1622   GLUCOSEU NEGATIVE 05/07/2020 1622   GLUCOSEU NEGATIVE 02/19/2020 1230   HGBUR MODERATE (A) 05/07/2020 1622   BILIRUBINUR NEGATIVE 05/07/2020 1622   BILIRUBINUR 1+ 06/13/2019 1531   KETONESUR NEGATIVE 05/07/2020 1622   PROTEINUR 100 (A) 05/07/2020 1622   UROBILINOGEN 0.2 02/19/2020  [place] 05/07/2020 1622   LEUKOCYTESUR SMALL (A) 05/07/2020 1622   Sepsis Labs Invalid input(s): PROCALCITONIN,  WBC,  LACTICIDVEN Microbiology Recent Results (from the past 240 hour(s))  Urine culture     Status: None   Collection Time: 05/07/20  4:22  PM   Specimen: Urine, Random  Result Value Ref Range Status   Specimen Description   Final    URINE, RANDOM Performed at [facility], [address] [place], [place] [address]    Special Requests   Final    NONE Performed at [facility], [address] [place], [place] [address]    Culture   Final    NO GROWTH Performed at [facility], [place] [address] [place], [place] [address]    Report Status 05/08/2020 FINAL  Final  SARS CORONAVIRUS 2 (TAT 6-24 HRS) Nasopharyngeal Nasopharyngeal Swab     Status: None   Collection Time: 05/07/20  4:34 PM   Specimen: Nasopharyngeal Swab  Result Value Ref Range Status   SARS Coronavirus 2 NEGATIVE NEGATIVE Final    Comment: (NOTE) SARS-CoV-2 target nucleic acids are NOT DETECTED.  The SARS-CoV-2 RNA is generally detectable in upper and lower respiratory specimens during the acute phase of infection. Negative results do not preclude SARS-CoV-2 infection, do not rule out co-infections with other pathogens, and should not be used as the sole basis for treatment or other patient management decisions. Negative results must be combined with clinical observations, patient history, and epidemiological information. The expected result is Negative.  Fact Sheet for Patients: SugarRoll.be  Fact Sheet for Healthcare Providers: https://www.woods-mathews.com/  This test is not yet approved or cleared by the [place] FDA and  has been authorized for detection and/or diagnosis of SARS-CoV-2 by FDA under an Emergency Use Authorization (EUA). This EUA will remain  in effect (meaning this test can be used) for the duration of the COVID-19 declaration under Se ction 564(b)(1) of the Act, 21 U.S.C. section 360bbb-3(b)(1), unless the authorization is terminated or revoked sooner.  Performed at [facility], [place] [address] [place], [place] [address]   Resp Panel by RT-PCR  (Flu A&B, Covid) Nasopharyngeal Swab     Status: None   Collection Time: 05/07/20  4:34 PM   Specimen: Nasopharyngeal Swab; Nasopharyngeal(NP) swabs in vial transport medium  Result Value Ref Range Status   SARS Coronavirus 2 by RT PCR NEGATIVE NEGATIVE Final    Comment: (NOTE) SARS-CoV-2 target nucleic acids are NOT DETECTED.  The SARS-CoV-2 RNA is generally detectable in upper respiratory specimens during the acute phase of infection. The lowest concentration of SARS-CoV-2 viral copies this assay can detect is 138 copies/mL. A negative result does not preclude SARS-Cov-2 infection and should not be used as the sole basis for treatment or other patient management decisions. A negative result may occur with  improper specimen collection/handling, submission of specimen other than nasopharyngeal swab, presence of viral mutation(s) within the areas targeted by this assay, and inadequate number of viral copies(<138 copies/mL). A negative result must be combined with clinical observations, patient history, and epidemiological information. The expected result is Negative.  Fact Sheet for Patients:  EntrepreneurPulse.com.au  Fact Sheet for Healthcare Providers:  IncredibleEmployment.be  This test is no t yet approved or cleared by the [place] FDA and  has been authorized for detection and/or diagnosis of SARS-CoV-2 by FDA under an Emergency Use Authorization (EUA). This EUA will remain  in  effect (meaning this test can be used) for the duration of the COVID-19 declaration under Section 564(b)(1) of the Act, 21 U.S.C.section 360bbb-3(b)(1), unless the authorization is terminated  or revoked sooner.       Influenza A by PCR NEGATIVE NEGATIVE Final   Influenza B by PCR NEGATIVE NEGATIVE Final    Comment: (NOTE) The Xpert Xpress SARS-CoV-2/FLU/RSV plus assay is intended as an aid in the diagnosis of influenza from Nasopharyngeal swab specimens  and should not be used as a sole basis for treatment. Nasal washings and aspirates are unacceptable for Xpert Xpress SARS-CoV-2/FLU/RSV testing.  Fact Sheet for Patients: EntrepreneurPulse.com.au  Fact Sheet for Healthcare Providers: IncredibleEmployment.be  This test is not yet approved or cleared by the [place] FDA and has been authorized for detection and/or diagnosis of SARS-CoV-2 by FDA under an Emergency Use Authorization (EUA). This EUA will remain in effect (meaning this test can be used) for the duration of the COVID-19 declaration under Section 564(b)(1) of the Act, 21 U.S.C. section 360bbb-3(b)(1), unless the authorization is terminated or revoked.  Performed at [facility], [place] [address], [place], [place] [address]   Culture, blood (routine x 2)     Status: None (Preliminary result)   Collection Time: 05/07/20  6:00 PM   Specimen: BLOOD  Result Value Ref Range Status   Specimen Description   Final    BLOOD LEFT ANTECUBITAL Performed at [facility], [place] [address] [place], [place] [address]    Special Requests   Final    BOTTLES DRAWN AEROBIC AND ANAEROBIC Blood Culture adequate volume Performed at [facility], [address] [place], [place] [address]    Culture   Final    NO GROWTH 2 DAYS Performed at [facility], [place] [address] [place], [place] [address]    Report Status PENDING  Incomplete  Culture, blood (routine x 2)     Status: None (Preliminary result)   Collection Time: 05/07/20  6:15 PM   Specimen: BLOOD  Result Value Ref Range Status   Specimen Description   Final    BLOOD RIGHT ANTECUBITAL Performed at [facility], [place] [address] [place], [place] [address]    Special Requests   Final    BOTTLES DRAWN AEROBIC AND ANAEROBIC Blood Culture adequate volume Performed at [facility], [place] [place], [place] [address]    Culture   Final    NO  GROWTH 2 DAYS Performed at [facility], [place] [address] [place], [place] [address]    Report Status PENDING  Incomplete     Time coordinating discharge: Over 30 minutes  SIGNED:   Dalante Minus J [place] DO  [facility] 05/10/2020, 10:49 AM

## 2020-05-10 NOTE — Progress Notes (Signed)
Called daughter-POA to inform mother was discharging-explained discharge instructions with her over the phone. Both patient and daughter verbalized understanding.

## 2020-05-10 NOTE — Plan of Care (Signed)
  Problem: Education: Goal: Knowledge of General Education information will improve Description: Including pain rating scale, medication(s)/side effects and non-pharmacologic comfort measures Outcome: Progressing   Problem: Clinical Measurements: Goal: Diagnostic test results will improve Outcome: Progressing   Problem: Coping: Goal: Level of anxiety will decrease Outcome: Progressing   Problem: Activity: Goal: Risk for activity intolerance will decrease Outcome: Progressing

## 2020-05-10 NOTE — Discharge Instructions (Signed)
Urinary Tract Infection, Adult  A urinary tract infection (UTI) is an infection of any part of the urinary tract. The urinary tract includes the kidneys, ureters, bladder, and urethra. These organs make, store, and get rid of urine in the body. An upper UTI affects the ureters and kidneys. A lower UTI affects the bladder and urethra. What are the causes? Most urinary tract infections are caused by bacteria in your genital area around your urethra, where urine leaves your body. These bacteria grow and cause inflammation of your urinary tract. What increases the risk? You are more likely to develop this condition if:  You have a urinary catheter that stays in place.  You are not able to control when you urinate or have a bowel movement (incontinence).  You are female and you: ? Use a spermicide or diaphragm for birth control. ? Have low estrogen levels. ? Are pregnant.  You have certain genes that increase your risk.  You are sexually active.  You take antibiotic medicines.  You have a condition that causes your flow of urine to slow down, such as: ? An enlarged prostate, if you are female. ? Blockage in your urethra. ? A kidney stone. ? A nerve condition that affects your bladder control (neurogenic bladder). ? Not getting enough to drink, or not urinating often.  You have certain medical conditions, such as: ? Diabetes. ? A weak disease-fighting system (immunesystem). ? Sickle cell disease. ? Gout. ? Spinal cord injury. What are the signs or symptoms? Symptoms of this condition include:  Needing to urinate right away (urgency).  Frequent urination. This may include small amounts of urine each time you urinate.  Pain or burning with urination.  Blood in the urine.  Urine that smells bad or unusual.  Trouble urinating.  Cloudy urine.  Vaginal discharge, if you are female.  Pain in the abdomen or the lower back. You may also have:  Vomiting or a decreased  appetite.  Confusion.  Irritability or tiredness.  A fever or chills.  Diarrhea. The first symptom in older adults may be confusion. In some cases, they may not have any symptoms until the infection has worsened. How is this diagnosed? This condition is diagnosed based on your medical history and a physical exam. You may also have other tests, including:  Urine tests.  Blood tests.  Tests for STIs (sexually transmitted infections). If you have had more than one UTI, a cystoscopy or imaging studies may be done to determine the cause of the infections. How is this treated? Treatment for this condition includes:  Antibiotic medicine.  Over-the-counter medicines to treat discomfort.  Drinking enough water to stay hydrated. If you have frequent infections or have other conditions such as a kidney stone, you may need to see a health care provider who specializes in the urinary tract (urologist). In rare cases, urinary tract infections can cause sepsis. Sepsis is a life-threatening condition that occurs when the body responds to an infection. Sepsis is treated in the hospital with IV antibiotics, fluids, and other medicines. Follow these instructions at home: Medicines  Take over-the-counter and prescription medicines only as told by your health care provider.  If you were prescribed an antibiotic medicine, take it as told by your health care provider. Do not stop using the antibiotic even if you start to feel better. General instructions  Make sure you: ? Empty your bladder often and completely. Do not hold urine for long periods of time. ? Empty your bladder after   sex. ? Wipe from front to back after urinating or having a bowel movement if you are female. Use each tissue only one time when you wipe.  Drink enough fluid to keep your urine pale yellow.  Keep all follow-up visits. This is important.   Contact a health care provider if:  Your symptoms do not get better after 1-2  days.  Your symptoms go away and then return. Get help right away if:  You have severe pain in your back or your lower abdomen.  You have a fever or chills.  You have nausea or vomiting. Summary  A urinary tract infection (UTI) is an infection of any part of the urinary tract, which includes the kidneys, ureters, bladder, and urethra.  Most urinary tract infections are caused by bacteria in your genital area.  Treatment for this condition often includes antibiotic medicines.  If you were prescribed an antibiotic medicine, take it as told by your health care provider. Do not stop using the antibiotic even if you start to feel better.  Keep all follow-up visits. This is important. This information is not intended to replace advice given to you by your health care provider. Make sure you discuss any questions you have with your health care provider. Document Revised: 10/19/2019 Document Reviewed: 10/19/2019 Elsevier Patient Education  2021 Elsevier Inc.  

## 2020-05-10 NOTE — Plan of Care (Signed)
Patient dc'd all care plans met  

## 2020-05-12 ENCOUNTER — Telehealth: Payer: Self-pay

## 2020-05-12 DIAGNOSIS — N390 Urinary tract infection, site not specified: Secondary | ICD-10-CM | POA: Diagnosis not present

## 2020-05-12 DIAGNOSIS — R8279 Other abnormal findings on microbiological examination of urine: Secondary | ICD-10-CM | POA: Diagnosis not present

## 2020-05-12 NOTE — Telephone Encounter (Signed)
Transition Care Management Follow-up Telephone Call  Date of discharge and from where: 05/10/2020 from [facility]  How have you been since you were released from the hospital? Doing fine  Any questions or concerns? No  Items Reviewed:  Did the pt receive and understand the discharge instructions provided? Yes   Medications obtained and verified? Yes   Other? No   Any new allergies since your discharge? No   Dietary orders reviewed? No  Do you have support at home? Yes   Home Care and Equipment/Supplies: Were home health services ordered? yes If so, what is the name of the agency? [facility]  Has the agency set up a time to come to the patient's home? no Were any new equipment or medical supplies ordered?  No What is the name of the medical supply agency? n/a Were you able to get the supplies/equipment? not applicable Do you have any questions related to the use of the equipment or supplies? No  Functional Questionnaire: (I = Independent and D = Dependent) ADLs: I  Bathing/Dressing- I  Meal Prep- I  Eating- I  Maintaining continence- I  Transferring/Ambulation- I  Managing Meds- I  Follow up appointments reviewed:   PCP Hospital f/u appt confirmed? Yes  Scheduled to see Pricilla Holm, MD on 05/19/2020 @ 1:00 pm.  [facility] f/u appt confirmed? Yes  Scheduled to see Philemon Kingdom, MD on 06/12/2020 @ 1:00 pm.  Are transportation arrangements needed? No   If their condition worsens, is the pt aware to call PCP or go to the Emergency Dept.? Yes  Was the patient provided with contact information for the PCP's office or ED? Yes  Was to pt encouraged to call back with questions or concerns? Yes

## 2020-05-13 LAB — CULTURE, BLOOD (ROUTINE X 2)
Culture: NO GROWTH
Culture: NO GROWTH
Special Requests: ADEQUATE
Special Requests: ADEQUATE

## 2020-05-14 ENCOUNTER — Other Ambulatory Visit: Payer: Self-pay | Admitting: Internal Medicine

## 2020-05-14 DIAGNOSIS — E039 Hypothyroidism, unspecified: Secondary | ICD-10-CM

## 2020-05-15 ENCOUNTER — Telehealth: Payer: Self-pay | Admitting: Internal Medicine

## 2020-05-15 NOTE — Telephone Encounter (Signed)
  [facility] calling to report nursing start of care 2/25

## 2020-05-15 NOTE — Telephone Encounter (Signed)
FYI

## 2020-05-16 ENCOUNTER — Other Ambulatory Visit: Payer: Self-pay

## 2020-05-16 ENCOUNTER — Telehealth: Payer: Self-pay | Admitting: Internal Medicine

## 2020-05-16 NOTE — Telephone Encounter (Signed)
Patient will have Prolia injection on Monday 2.28.22 when she comes to see Dr. Sharlet Salina for her hospital follow-up  No out of pocket expense, no PA needed

## 2020-05-16 NOTE — Telephone Encounter (Signed)
FYI

## 2020-05-17 DIAGNOSIS — I152 Hypertension secondary to endocrine disorders: Secondary | ICD-10-CM | POA: Diagnosis not present

## 2020-05-17 DIAGNOSIS — I5032 Chronic diastolic (congestive) heart failure: Secondary | ICD-10-CM | POA: Diagnosis not present

## 2020-05-17 DIAGNOSIS — E43 Unspecified severe protein-calorie malnutrition: Secondary | ICD-10-CM | POA: Diagnosis not present

## 2020-05-17 DIAGNOSIS — Z9181 History of falling: Secondary | ICD-10-CM | POA: Diagnosis not present

## 2020-05-17 DIAGNOSIS — E1151 Type 2 diabetes mellitus with diabetic peripheral angiopathy without gangrene: Secondary | ICD-10-CM | POA: Diagnosis not present

## 2020-05-17 DIAGNOSIS — Z794 Long term (current) use of insulin: Secondary | ICD-10-CM | POA: Diagnosis not present

## 2020-05-17 DIAGNOSIS — E785 Hyperlipidemia, unspecified: Secondary | ICD-10-CM | POA: Diagnosis not present

## 2020-05-17 DIAGNOSIS — E1159 Type 2 diabetes mellitus with other circulatory complications: Secondary | ICD-10-CM | POA: Diagnosis not present

## 2020-05-17 DIAGNOSIS — E1169 Type 2 diabetes mellitus with other specified complication: Secondary | ICD-10-CM | POA: Diagnosis not present

## 2020-05-17 DIAGNOSIS — N390 Urinary tract infection, site not specified: Secondary | ICD-10-CM | POA: Diagnosis not present

## 2020-05-17 DIAGNOSIS — F411 Generalized anxiety disorder: Secondary | ICD-10-CM | POA: Diagnosis not present

## 2020-05-17 DIAGNOSIS — E1165 Type 2 diabetes mellitus with hyperglycemia: Secondary | ICD-10-CM | POA: Diagnosis not present

## 2020-05-17 DIAGNOSIS — Z602 Problems related to living alone: Secondary | ICD-10-CM | POA: Diagnosis not present

## 2020-05-17 DIAGNOSIS — A419 Sepsis, unspecified organism: Secondary | ICD-10-CM | POA: Diagnosis not present

## 2020-05-17 DIAGNOSIS — J479 Bronchiectasis, uncomplicated: Secondary | ICD-10-CM | POA: Diagnosis not present

## 2020-05-17 DIAGNOSIS — F32A Depression, unspecified: Secondary | ICD-10-CM | POA: Diagnosis not present

## 2020-05-19 ENCOUNTER — Encounter: Payer: Self-pay | Admitting: Internal Medicine

## 2020-05-19 ENCOUNTER — Ambulatory Visit (INDEPENDENT_AMBULATORY_CARE_PROVIDER_SITE_OTHER): Payer: Medicare Other | Admitting: Internal Medicine

## 2020-05-19 ENCOUNTER — Other Ambulatory Visit: Payer: Self-pay

## 2020-05-19 VITALS — BP 124/90 | HR 61 | Temp 98.1°F | Resp 18 | Ht 59.0 in | Wt 121.8 lb

## 2020-05-19 DIAGNOSIS — R399 Unspecified symptoms and signs involving the genitourinary system: Secondary | ICD-10-CM

## 2020-05-19 DIAGNOSIS — J479 Bronchiectasis, uncomplicated: Secondary | ICD-10-CM | POA: Diagnosis not present

## 2020-05-19 DIAGNOSIS — R300 Dysuria: Secondary | ICD-10-CM

## 2020-05-19 DIAGNOSIS — E119 Type 2 diabetes mellitus without complications: Secondary | ICD-10-CM

## 2020-05-19 DIAGNOSIS — F411 Generalized anxiety disorder: Secondary | ICD-10-CM

## 2020-05-19 DIAGNOSIS — D509 Iron deficiency anemia, unspecified: Secondary | ICD-10-CM | POA: Diagnosis not present

## 2020-05-19 DIAGNOSIS — M818 Other osteoporosis without current pathological fracture: Secondary | ICD-10-CM

## 2020-05-19 DIAGNOSIS — I10 Essential (primary) hypertension: Secondary | ICD-10-CM

## 2020-05-19 LAB — URINALYSIS, ROUTINE W REFLEX MICROSCOPIC
Bilirubin Urine: NEGATIVE
Hgb urine dipstick: NEGATIVE
Ketones, ur: NEGATIVE
Leukocytes,Ua: NEGATIVE
Nitrite: NEGATIVE
Specific Gravity, Urine: 1.015 (ref 1.000–1.030)
Total Protein, Urine: NEGATIVE
Urine Glucose: NEGATIVE
Urobilinogen, UA: 0.2 (ref 0.0–1.0)
pH: 6.5 (ref 5.0–8.0)

## 2020-05-19 LAB — COMPREHENSIVE METABOLIC PANEL
ALT: 13 U/L (ref 0–35)
AST: 14 U/L (ref 0–37)
Albumin: 3.6 g/dL (ref 3.5–5.2)
Alkaline Phosphatase: 30 U/L — ABNORMAL LOW (ref 39–117)
BUN: 19 mg/dL (ref 6–23)
CO2: 27 mEq/L (ref 19–32)
Calcium: 9.6 mg/dL (ref 8.4–10.5)
Chloride: 104 mEq/L (ref 96–112)
Creatinine, Ser: 0.95 mg/dL (ref 0.40–1.20)
GFR: 55.64 mL/min — ABNORMAL LOW (ref >60.00)
Glucose, Bld: 97 mg/dL (ref 70–99)
Potassium: 4.2 mEq/L (ref 3.5–5.1)
Sodium: 138 mEq/L (ref 135–145)
Total Bilirubin: 0.3 mg/dL (ref 0.2–1.2)
Total Protein: 6.4 g/dL (ref 6.0–8.3)

## 2020-05-19 LAB — CBC
HCT: 33.9 % — ABNORMAL LOW (ref 36.0–46.0)
Hemoglobin: 11.3 g/dL — ABNORMAL LOW (ref 12.0–15.0)
MCHC: 33.4 g/dL (ref 30.0–36.0)
MCV: 107.8 fl — ABNORMAL HIGH (ref 78.0–100.0)
Platelets: 363 10*3/uL (ref 150.0–400.0)
RBC: 3.14 Mil/uL — ABNORMAL LOW (ref 3.87–5.11)
RDW: 13.6 % (ref 11.5–15.5)
WBC: 13.6 10*3/uL — ABNORMAL HIGH (ref 4.0–10.5)

## 2020-05-19 MED ORDER — DENOSUMAB 60 MG/ML ~~LOC~~ SOSY
60.0000 mg | PREFILLED_SYRINGE | Freq: Once | SUBCUTANEOUS | Status: AC
Start: 2020-05-19 — End: 2020-05-19
  Administered 2020-05-19: 60 mg via SUBCUTANEOUS

## 2020-05-19 MED ORDER — AMITRIPTYLINE HCL 10 MG PO TABS: ORAL_TABLET | ORAL | 3 refills | Status: DC

## 2020-05-19 MED ORDER — ROSUVASTATIN CALCIUM 10 MG PO TABS: 10.0000 mg | ORAL_TABLET | Freq: Every evening | ORAL | 3 refills | Status: DC

## 2020-05-19 MED ORDER — DULOXETINE HCL 60 MG PO CPEP: 60.0000 mg | ORAL_CAPSULE | Freq: Every day | ORAL | 3 refills | Status: DC

## 2020-05-19 MED ORDER — DULOXETINE HCL 30 MG PO CPEP: 30.0000 mg | ORAL_CAPSULE | Freq: Every day | ORAL | 3 refills | Status: DC

## 2020-05-19 NOTE — Patient Instructions (Addendum)
We will check the labs today and the urine.   Cut back on the insulin to 13 units instead of 15 units.

## 2020-05-19 NOTE — Progress Notes (Signed)
   Subjective:   Patient ID: Kayla Thompson, female    DOB: Dec 28, 1935, 85 y.o.   MRN: [id number]  HPI The patient is an 85 YO female coming in for hospital follow up (in for AMS, treated for UTI although culture came back no growth for urine and blood, she did improve well on antibiotics, CT head normal and labs stable overall). She has been doing okay since getting home from the hospital. She has been taking an iron supplement and this is making her constipated. They would like to know if she needs this. Denies chest pains or SOB. Denies abdominal pain. Denies blood in stool. No urine changes.   PMH, [facility], social history reviewed and updated  Review of Systems  Constitutional: Positive for activity change and fatigue.  HENT: Negative.   Eyes: Negative.   Respiratory: Negative for cough, chest tightness and shortness of breath.   Cardiovascular: Negative for chest pain, palpitations and leg swelling.  Gastrointestinal: Negative for abdominal distention, abdominal pain, constipation, diarrhea, nausea and vomiting.  Musculoskeletal: Positive for arthralgias and myalgias.  Skin: Negative.   Neurological: Negative.   Psychiatric/Behavioral: Negative.     Objective:  Physical Exam Constitutional:      Appearance: She is well-developed and well-nourished.     Comments: Chronically ill appearing  HENT:     Head: Normocephalic and atraumatic.  Eyes:     Extraocular Movements: EOM normal.  Cardiovascular:     Rate and Rhythm: Normal rate and regular rhythm.  Pulmonary:     Effort: Pulmonary effort is normal. No respiratory distress.     Breath sounds: No wheezing or rales.     Comments: Stable lung exam Abdominal:     General: Bowel sounds are normal. There is no distension.     Palpations: Abdomen is soft.     Tenderness: There is no abdominal tenderness. There is no rebound.  Musculoskeletal:        General: No edema.     Cervical back: Normal range of motion.  Skin:    General:  Skin is warm and dry.  Neurological:     Mental Status: She is alert and oriented to person, place, and time.     Coordination: Coordination abnormal.     Comments: walker  Psychiatric:        Mood and Affect: Mood and affect normal.     Vitals:   05/19/20 1258  BP: 124/90  Pulse: 61  Resp: 18  Temp: 98.1 F (36.7 C)  TempSrc: Oral  SpO2: 94%  Weight: 121 lb 12.8 oz (55.2 kg)  Height: 4\' 11"  (1.499 m)    This visit occurred during the SARS-CoV-2 public health emergency.  Safety protocols were in place, including screening questions prior to the visit, additional usage of staff PPE, and extensive cleaning of exam room while observing appropriate contact time as indicated for disinfecting solutions.   Assessment & Plan:  Prolia given at visit

## 2020-05-21 ENCOUNTER — Other Ambulatory Visit: Payer: Self-pay | Admitting: Gastroenterology

## 2020-05-21 DIAGNOSIS — I152 Hypertension secondary to endocrine disorders: Secondary | ICD-10-CM | POA: Diagnosis not present

## 2020-05-21 DIAGNOSIS — E1151 Type 2 diabetes mellitus with diabetic peripheral angiopathy without gangrene: Secondary | ICD-10-CM | POA: Diagnosis not present

## 2020-05-21 DIAGNOSIS — I5032 Chronic diastolic (congestive) heart failure: Secondary | ICD-10-CM | POA: Diagnosis not present

## 2020-05-21 DIAGNOSIS — A419 Sepsis, unspecified organism: Secondary | ICD-10-CM | POA: Diagnosis not present

## 2020-05-21 DIAGNOSIS — E1159 Type 2 diabetes mellitus with other circulatory complications: Secondary | ICD-10-CM | POA: Diagnosis not present

## 2020-05-21 DIAGNOSIS — N390 Urinary tract infection, site not specified: Secondary | ICD-10-CM | POA: Diagnosis not present

## 2020-05-21 LAB — URINE CULTURE

## 2020-05-21 NOTE — Assessment & Plan Note (Signed)
Overall stable today and using albuterol as needed and trelegy.

## 2020-05-21 NOTE — Assessment & Plan Note (Signed)
Most recent Hg 12 so we have advised them to stop iron supplement. Recheck CBC today.

## 2020-05-21 NOTE — Assessment & Plan Note (Signed)
We did discuss extensively how her sugars are dropping some with morning sugars down to 80s at times and her HgA1c is 5.9 which is inappropriately low considering she is on insulin. I have advised them to decrease lantus to 13 units (from 15 units) until next endo visit to allow her morning sugars to elevate slightly and we talked about the morbidity associated with low sugars which she is having and is at serious risk for with her poor appetite.

## 2020-05-21 NOTE — Assessment & Plan Note (Signed)
Given prolia today and continue every 6 months. Recent calcium normal.

## 2020-05-21 NOTE — Assessment & Plan Note (Signed)
Stable with buspar and cymbalta overall she is satisfied with control.

## 2020-05-21 NOTE — Assessment & Plan Note (Signed)
Checking U/A and culture today to ensure this is clear. She is mentally clear today.

## 2020-05-22 DIAGNOSIS — M542 Cervicalgia: Secondary | ICD-10-CM | POA: Diagnosis not present

## 2020-05-22 DIAGNOSIS — Z794 Long term (current) use of insulin: Secondary | ICD-10-CM | POA: Diagnosis not present

## 2020-05-22 DIAGNOSIS — Z79899 Other long term (current) drug therapy: Secondary | ICD-10-CM | POA: Diagnosis not present

## 2020-05-22 DIAGNOSIS — E119 Type 2 diabetes mellitus without complications: Secondary | ICD-10-CM | POA: Diagnosis not present

## 2020-05-22 DIAGNOSIS — K5909 Other constipation: Secondary | ICD-10-CM | POA: Diagnosis not present

## 2020-05-23 ENCOUNTER — Other Ambulatory Visit: Payer: Self-pay | Admitting: Internal Medicine

## 2020-05-23 ENCOUNTER — Telehealth: Payer: Self-pay | Admitting: Internal Medicine

## 2020-05-23 DIAGNOSIS — R300 Dysuria: Secondary | ICD-10-CM

## 2020-05-23 NOTE — Telephone Encounter (Signed)
Patients daughter [name] calling, she just looked at her mothers recent lab work and is concerned and wants to speak with someone today

## 2020-05-23 NOTE — Telephone Encounter (Signed)
Patients daughter [name] calling to request lab results be faxed to [facility] , fax [phone]  She states she wants the labs faxed now, so medication can be prescribed by Urologist today  Daughter states if she doesn't get a call within a hour she will call again

## 2020-05-26 ENCOUNTER — Other Ambulatory Visit: Payer: Medicare Other

## 2020-05-26 ENCOUNTER — Other Ambulatory Visit (INDEPENDENT_AMBULATORY_CARE_PROVIDER_SITE_OTHER): Payer: Medicare Other

## 2020-05-26 ENCOUNTER — Other Ambulatory Visit: Payer: Self-pay

## 2020-05-26 DIAGNOSIS — R300 Dysuria: Secondary | ICD-10-CM | POA: Diagnosis not present

## 2020-05-26 LAB — URINALYSIS, ROUTINE W REFLEX MICROSCOPIC
Bilirubin Urine: NEGATIVE
Hgb urine dipstick: NEGATIVE
Ketones, ur: NEGATIVE
Leukocytes,Ua: NEGATIVE
Nitrite: NEGATIVE
Specific Gravity, Urine: 1.02 (ref 1.000–1.030)
Total Protein, Urine: NEGATIVE
Urine Glucose: NEGATIVE
Urobilinogen, UA: 0.2 (ref 0.0–1.0)
pH: 6 (ref 5.0–8.0)

## 2020-05-26 NOTE — Telephone Encounter (Signed)
Labs were faxed to [facility] Friday afternoon.

## 2020-05-27 LAB — URINE CULTURE

## 2020-05-28 NOTE — Telephone Encounter (Signed)
Results have been faxed to [facility]. Confirmation fax has been received.

## 2020-05-28 NOTE — Telephone Encounter (Signed)
Daughter requesting lab results from 3/7 be faxed to [facility]

## 2020-06-03 DIAGNOSIS — E1159 Type 2 diabetes mellitus with other circulatory complications: Secondary | ICD-10-CM | POA: Diagnosis not present

## 2020-06-03 DIAGNOSIS — A419 Sepsis, unspecified organism: Secondary | ICD-10-CM | POA: Diagnosis not present

## 2020-06-03 DIAGNOSIS — I152 Hypertension secondary to endocrine disorders: Secondary | ICD-10-CM | POA: Diagnosis not present

## 2020-06-03 DIAGNOSIS — I5032 Chronic diastolic (congestive) heart failure: Secondary | ICD-10-CM | POA: Diagnosis not present

## 2020-06-03 DIAGNOSIS — E1151 Type 2 diabetes mellitus with diabetic peripheral angiopathy without gangrene: Secondary | ICD-10-CM | POA: Diagnosis not present

## 2020-06-03 DIAGNOSIS — N390 Urinary tract infection, site not specified: Secondary | ICD-10-CM | POA: Diagnosis not present

## 2020-06-06 DIAGNOSIS — E1151 Type 2 diabetes mellitus with diabetic peripheral angiopathy without gangrene: Secondary | ICD-10-CM | POA: Diagnosis not present

## 2020-06-06 DIAGNOSIS — I5032 Chronic diastolic (congestive) heart failure: Secondary | ICD-10-CM | POA: Diagnosis not present

## 2020-06-06 DIAGNOSIS — I152 Hypertension secondary to endocrine disorders: Secondary | ICD-10-CM | POA: Diagnosis not present

## 2020-06-06 DIAGNOSIS — E1159 Type 2 diabetes mellitus with other circulatory complications: Secondary | ICD-10-CM | POA: Diagnosis not present

## 2020-06-06 DIAGNOSIS — N390 Urinary tract infection, site not specified: Secondary | ICD-10-CM | POA: Diagnosis not present

## 2020-06-06 DIAGNOSIS — A419 Sepsis, unspecified organism: Secondary | ICD-10-CM | POA: Diagnosis not present

## 2020-06-10 DIAGNOSIS — A419 Sepsis, unspecified organism: Secondary | ICD-10-CM | POA: Diagnosis not present

## 2020-06-10 DIAGNOSIS — E1151 Type 2 diabetes mellitus with diabetic peripheral angiopathy without gangrene: Secondary | ICD-10-CM | POA: Diagnosis not present

## 2020-06-10 DIAGNOSIS — I5032 Chronic diastolic (congestive) heart failure: Secondary | ICD-10-CM | POA: Diagnosis not present

## 2020-06-10 DIAGNOSIS — I152 Hypertension secondary to endocrine disorders: Secondary | ICD-10-CM | POA: Diagnosis not present

## 2020-06-10 DIAGNOSIS — N390 Urinary tract infection, site not specified: Secondary | ICD-10-CM | POA: Diagnosis not present

## 2020-06-10 DIAGNOSIS — E1159 Type 2 diabetes mellitus with other circulatory complications: Secondary | ICD-10-CM | POA: Diagnosis not present

## 2020-06-11 DIAGNOSIS — N390 Urinary tract infection, site not specified: Secondary | ICD-10-CM | POA: Diagnosis not present

## 2020-06-11 DIAGNOSIS — E1159 Type 2 diabetes mellitus with other circulatory complications: Secondary | ICD-10-CM | POA: Diagnosis not present

## 2020-06-11 DIAGNOSIS — E1151 Type 2 diabetes mellitus with diabetic peripheral angiopathy without gangrene: Secondary | ICD-10-CM | POA: Diagnosis not present

## 2020-06-11 DIAGNOSIS — I152 Hypertension secondary to endocrine disorders: Secondary | ICD-10-CM | POA: Diagnosis not present

## 2020-06-11 DIAGNOSIS — A419 Sepsis, unspecified organism: Secondary | ICD-10-CM | POA: Diagnosis not present

## 2020-06-11 DIAGNOSIS — I5032 Chronic diastolic (congestive) heart failure: Secondary | ICD-10-CM | POA: Diagnosis not present

## 2020-06-12 ENCOUNTER — Other Ambulatory Visit: Payer: Medicare Other

## 2020-06-12 ENCOUNTER — Ambulatory Visit (INDEPENDENT_AMBULATORY_CARE_PROVIDER_SITE_OTHER): Payer: Medicare Other | Admitting: Internal Medicine

## 2020-06-12 ENCOUNTER — Encounter: Payer: Self-pay | Admitting: Internal Medicine

## 2020-06-12 ENCOUNTER — Other Ambulatory Visit: Payer: Self-pay

## 2020-06-12 VITALS — BP 120/78 | HR 102 | Ht 59.0 in | Wt 121.8 lb

## 2020-06-12 DIAGNOSIS — E119 Type 2 diabetes mellitus without complications: Secondary | ICD-10-CM | POA: Diagnosis not present

## 2020-06-12 DIAGNOSIS — E039 Hypothyroidism, unspecified: Secondary | ICD-10-CM | POA: Diagnosis not present

## 2020-06-12 DIAGNOSIS — I10 Essential (primary) hypertension: Secondary | ICD-10-CM | POA: Diagnosis not present

## 2020-06-12 DIAGNOSIS — E559 Vitamin D deficiency, unspecified: Secondary | ICD-10-CM

## 2020-06-12 DIAGNOSIS — M818 Other osteoporosis without current pathological fracture: Secondary | ICD-10-CM | POA: Diagnosis not present

## 2020-06-12 MED ORDER — LANTUS SOLOSTAR 100 UNIT/ML ~~LOC~~ SOPN: 8.0000 [IU] | PEN_INJECTOR | Freq: Every day | SUBCUTANEOUS | 3 refills | Status: DC

## 2020-06-12 NOTE — Patient Instructions (Addendum)
Please decrease: - Lantus 8 units in am  Please let me know if the sugars remain >200 at bedtime.  Continue levothyroxine 50 mcg daily.  Take the thyroid hormone every day, with water, at least 30 minutes before breakfast.  Continue vitamin D 2000 units daily.  Please stop at the lab.  You need a bone density test.  Check sugars 1-2x a day, rotating check times.  Please come back for a follow-up appointment in 6 months.

## 2020-06-12 NOTE — Progress Notes (Addendum)
Patient ID: Kayla Thompson, female   DOB: 18-Aug-1935, 85 y.o.   MRN: [id number]  This visit occurred during the SARS-CoV-2 public health emergency.  Safety protocols were in place, including screening questions prior to the visit, additional usage of staff PPE, and extensive cleaning of exam room while observing appropriate contact time as indicated for disinfecting solutions.   HPI  Kayla Thompson is a 85 y.o.-year-old female, initially referred by her OB/GYN doctor, Dr. Uvaldo Rising, for evaluation for hyperparathyroidism + normo/hypo-calcemia, vitamin D deficiency,  and DM2, dx 2010, insulin-dependent since 2010, controlled, w/o long-term complications.  Last visit 6 months ago.  At today's visit, she is here with her caretaker.  She stays with her during the day.  The conversation today was also carried over the phone with her daughter.  She offered more information about medications diet, activity, and medication changes.  Interim history: Last month she was admitted for UTI (05/07/2020).  She was previously admitted for sepsis due to UTI (01/12/2020). On ABx. On Prednisone 2 mg a day.  Reviewed history: Pt was dx with hyperparathyroidism in 08/2014.  Reviewed pertinent labs: Lab Results  Component Value Date   PTH 37 06/09/2015   PTH Comment 06/09/2015   PTH 73 (H) 10/28/2014   PTH Comment 10/28/2014   PTH 190 (H) 09/10/2014   PTH 243 (H) 08/23/2014   CALCIUM 9.6 05/19/2020   CALCIUM 9.3 05/09/2020   CALCIUM 9.0 05/08/2020   CALCIUM 8.9 05/07/2020   CALCIUM 8.4 02/19/2020   CALCIUM 9.1 01/25/2020   CALCIUM 8.4 (L) 01/15/2020   CALCIUM 8.2 (L) 01/14/2020   CALCIUM 8.2 (L) 01/13/2020   CALCIUM 8.7 (L) 01/12/2020   Her osteoporosis was diagnosed in 2007.  She had few falls.  Reviewed previous DXA scan report: Date L1-L2 (L3 and L4 excluded) T score FN T score  [facility]  +0.4  LFN: -2.8  07/25/2014   -0.1  LFN: -2.7  Right hip could not be analyzed due to  previous instrumentation.  She has a history of multiple fractures in: - 09/23/1990: pelvic fx - 02/2005: R hip fx - rod - 05/2006: rib fx - 12/2008: 3 R ankle fx - in [place] - plate and screws - 98/9211: wrist fx's 01/2012: steroid inj's R knee - 07/2014: forearm fx and nasal bone fx - 09/2019: Chest x-ray: Age-indeterminate fractures of the third and fourth ribs  She started Prolia in 2017.   Per PCP. She is tolerating this well.   No h/o kidney stones.  No CKD. Last BUN/Cr: Lab Results  Component Value Date   BUN 19 05/19/2020   CREATININE 0.95 05/19/2020   She is not on HCTZ.  Patient continues on Caltrate-vit D 600 mg 2x a day, multivitamin, and vitamin D 2000 units daily.  No FH of hypercalcemia, pituitary tumors, thyroid cancer, or osteoporosis.   Vitamin D level: Elevated at last check, despite the fact that we decreased the dose of vitamin D from 4000 to 2000 units in 11/2019: Lab Results  Component Value Date   VD25OH 101.47 (HH) 02/19/2020   VD25OH 99.3 12/14/2019   VD25OH 72.23 10/12/2018   VD25OH 81.19 06/09/2015   VD25OH 65.60 01/28/2015   VD25OH 48 08/23/2014   A 24-hour urine calcium was normal: Component     Latest Ref Rng 03/31/2015  Calcium, Ur     Not estab mg/dL 9  Calcium, 24 hour urine     35 - 250 mg/24 h 108  Creatinine,  Urine     20 - 320 mg/dL 78  Creatinine, 24H Ur     0.63 - 2.50 g/24 h 0.94   DM2: - dx 2010 >> she started insulin and diagnosis.  Reviewed HbA1c levels: Lab Results  Component Value Date   HGBA1C 5.9 (H) 05/08/2020   HGBA1C 6.6 (H) 01/13/2020   HGBA1C 6.3 (H) 10/16/2019   She is on: - Lantus 30 >> 25 >> 20 >> 15 >>  units in am Previously on: Humalog 4-6 >> Novolog 4 units before breakfast only >> stopped 09/2018  She checks sugars once a day: - am: 109-119 >> 91-142 >> 84-105, 142 >> 48, 52, 63-136, 169 - after lunch: 120 >> 116-154 >> n/c  - after dinner: 132 >> 70 >> n/c - bedtime: 60 >> n/c >> <160 >>  84-190s >> 89-256 Lowest: 91 >> 84 (maybe one in  70s) Highest: 194 >> 190s.  -+ HL; latest lipids:  Lab Results  Component Value Date   CHOL 132 10/16/2019   HDL 55 10/16/2019   LDLCALC 57 10/16/2019   TRIG 114 10/16/2019   CHOLHDL 2.4 10/16/2019  On Crestor 10.  + Mild CKD;. Latest BUN/Cr: Lab Results  Component Value Date   BUN 19 05/19/2020   Lab Results  Component Value Date   CREATININE 0.95 05/19/2020   Last dilated eye exam: 10/10/2019: No DR; Dr Gershon Crane.  She has a history of chalazion, has a history of cataract surgery.  She also has a history of Ovarian cancer - 1996, urinary incontinence, HTN.  She has a history of hypothyroidism: Lab Results  Component Value Date   TSH 0.681 05/07/2020   TSH 0.77 02/19/2020   TSH 0.76 10/16/2019   TSH 0.110 (L) 01/27/2019   TSH 0.85 10/12/2018   TSH 0.23 (L) 12/01/2016   TSH 0.61 07/15/2016   TSH 0.61 10/21/2015   TSH 0.36 03/03/2015   TSH 0.591 08/02/2014   She is very reticent to decrease the dose of levothyroxine due to fear of hair loss.  At last visit she was on levothyroxine 50 mcg daily.  In 01/2019, her TSH was suppressed and I again advised her to decrease the dose to 25 g daily.  She did not do so.  Pt takes the levothyroxine: - in am - fasting - at least 30 min from b'fast (moved from 10 min later - at last visit) - + Ca, Fe, MVI, PPIs along with levothyroxine - not on Biotin  Pt denies: - feeling nodules in neck - hoarseness - choking - SOB with lying down But does have chronic dysphagia  ROS: Constitutional: no weight gain/no weight loss, no fatigue, no subjective hyperthermia, no subjective hypothermia Eyes: no blurry vision, no xerophthalmia ENT: no sore throat, + see HPI Cardiovascular: no CP/no SOB/no palpitations/+ leg swelling Respiratory: no cough/no SOB/no wheezing Gastrointestinal: no N/no V/no D/no C/+ acid reflux Musculoskeletal: + muscle aches/+ joint aches (shoulders) Skin: No  rashes she had a repeat, no hair loss Neurological: + tremors/no numbness/no tingling/no dizziness  I reviewed pt's medications, allergies, PMH, social hx, family hx, and changes were documented in the history of present illness. Otherwise, unchanged from my initial visit note.  Past Medical History:  Diagnosis Date  . Anxiety   . Arthritis   . Asthma   . Cataracts, bilateral    removed by surgery  . COPD (chronic obstructive pulmonary disease) ([place])   . Depression   . Diabetes mellitus without complication ([place])  type 2  . Dyspnea    with exertion  . Fibromyalgia   . GERD (gastroesophageal reflux disease)   . History of chemotherapy   . History of fractured pelvis   . Hypertension   . Hypothyroidism   . Osteoporosis 12/2017   T score -2.8 stable from prior DEXA  . Osteoporosis   . Ovarian cancer ([place])   . Pneumonia    several times   Past Surgical History:  Procedure Laterality Date  . ABDOMINAL HYSTERECTOMY  1996  . ANKLE FRACTURE SURGERY     plate and screws  . BIOPSY  02/05/2019   Procedure: BIOPSY;  Surgeon: Rush Landmark Telford Nab., MD;  Location: [facility];  Service: Gastroenterology;;  . BLADDER SUSPENSION    . CATARACT EXTRACTION    . COLONOSCOPY    . ESOPHAGEAL BRUSHING  02/05/2019   Procedure: ESOPHAGEAL BRUSHING;  Surgeon: Rush Landmark Telford Nab., MD;  Location: [facility];  Service: Gastroenterology;;  . ESOPHAGOGASTRODUODENOSCOPY (EGD) WITH PROPOFOL N/A 02/05/2019   Procedure: ESOPHAGOGASTRODUODENOSCOPY (EGD) WITH PROPOFOL with dialtion;  Surgeon: Irving Copas., MD;  Location: [facility];  Service: Gastroenterology;  Laterality: N/A;  . EYE SURGERY     cataracts removed-bilateral  . hip surgey    . knee surgey    . LUNG SURGERY    . OOPHORECTOMY     BSO  . SAVORY DILATION N/A 02/05/2019   Procedure: SAVORY DILATION;  Surgeon: Rush Landmark Telford Nab., MD;  Location: [facility];  Service: Gastroenterology;  Laterality: N/A;  .  TONSILLECTOMY    . UPPER GI ENDOSCOPY     History   Social History  . Marital Status: Widowed    Spouse Name: N/A  . Number of Children: 2 (1 adopted)   Occupational History  . retired   Social History Main Topics  . Smoking status: Never Smoker   . Smokeless tobacco: Never Used  . Alcohol Use: No  . Drug Use: No   Current Outpatient Medications on File Prior to Visit  Medication Sig Dispense Refill  . albuterol (PROAIR HFA) 108 (90 Base) MCG/ACT inhaler Inhale 2 puffs into the lungs every 6 (six) hours as needed for wheezing or shortness of breath. 18 g 12  . amitriptyline (ELAVIL) 10 MG tablet TAKE 1 TABLET BY MOUTH EVERYDAY AT BEDTIME 90 tablet 3  . baclofen (LIORESAL) 10 MG tablet TAKE 0.5-1 TABLET BY MOUTH TWICE DAILY AS NEEDED FOR MUSCLE SPASMS OR PAIN (Patient taking differently: Take 5-10 mg by mouth 2 (two) times daily as needed for muscle spasms.) 180 tablet 1  . BD PEN NEEDLE NANO 2ND GEN 32G X 4 MM MISC USE AS DIRECTED 4 TIMES A DAY 100 each 5  . busPIRone (BUSPAR) 5 MG tablet TAKE 1 TO 2 TABLETS BY MOUTH TWICE A DAY (Patient taking differently: Take 5-10 mg by mouth as needed.) 360 tablet 1  . clotrimazole-betamethasone (LOTRISONE) cream APPLY TO AFFECTED AREA TWICE A DAY (Patient taking differently: Apply 1 application topically 2 (two) times daily.) 45 g 6  . DULoxetine (CYMBALTA) 30 MG capsule Take 1 capsule (30 mg total) by mouth daily. 90 capsule 3  . DULoxetine (CYMBALTA) 60 MG capsule Take 1 capsule (60 mg total) by mouth daily. 90 capsule 3  . feeding supplement (ENSURE ENLIVE / ENSURE PLUS) LIQD Take 237 mLs by mouth 2 (two) times daily between meals. 237 mL 12  . Fluticasone-Umeclidin-Vilant (TRELEGY ELLIPTA) 100-62.5-25 MCG/INH AEPB Inhale 1 puff into the lungs daily. Rinse mouth 60 each 11  .  glucose blood (FREESTYLE LITE) test strip USE TO TEST BLOOD SUGAR 3 TIMES DAILY. DX: E11.65 100 each 5  . HYDROcodone-acetaminophen (NORCO) 10-325 MG tablet Take 1 tablet  by mouth every 4 (four) hours as needed for severe pain.    Marland Kitchen insulin glargine (LANTUS SOLOSTAR) 100 UNIT/ML Solostar Pen Inject 15 Units into the skin daily.    Marland Kitchen ipratropium-albuterol (DUONEB) 0.5-2.5 (3) MG/3ML SOLN Inhale 3 mLs into the lungs 3 (three) times daily as needed (shortness of breath). USE 1 VIAL IN NEBULIZER 3 TIMES DAILY    . Lancets (FREESTYLE) lancets Use to test blood sugar 3 times daily. Dx: E11.65 100 each 3  . levothyroxine (SYNTHROID) 50 MCG tablet TAKE 1 TABLET BY MOUTH EVERY DAY BEFORE BREAKFAST 90 tablet 1  . lidocaine (XYLOCAINE) 5 % ointment Apply 1 application topically as needed. Apply to external vulvar area 50 g 1  . magnesium oxide (MAG-OX) 400 MG tablet TAKE 1 TABLET BY MOUTH EVERY DAY (Patient taking differently: Take 400 mg by mouth daily.) 90 tablet 0  . Misc Natural Products (TART CHERRY ADVANCED PO) Take by mouth. Once daily    . montelukast (SINGULAIR) 10 MG tablet TAKE 1 TABLET BY MOUTH EVERY DAY (Patient taking differently: Take 10 mg by mouth daily.) 90 tablet 2  . omeprazole (PRILOSEC) 20 MG capsule TAKE 1 CAPSULE BY MOUTH 2 TIMES A DAY BEFORE A MEAL 180 capsule 1  . predniSONE (DELTASONE) 1 MG tablet Take 2 tablets (2 mg total) by mouth daily. 60 tablet 1  . Pseudoephedrine-guaiFENesin (MUCINEX D MAX STRENGTH PO) Take 2,400 mg by mouth daily.    . rosuvastatin (CRESTOR) 10 MG tablet Take 1 tablet (10 mg total) by mouth every evening. 90 tablet 3  . UNABLE TO FIND Take 1,000 mg by mouth daily. Med Name: Tumeric     No current facility-administered medications on file prior to visit.   Allergies  Allergen Reactions  . Latex Rash  . Penicillins Rash    Has patient had a PCN reaction causing immediate rash, facial/tongue/throat swelling, SOB or lightheadedness with hypotension: No Has patient had a PCN reaction causing severe rash involving mucus membranes or skin necrosis: No Has patient had a PCN reaction that required hospitalization: No Has  patient had a PCN reaction occurring within the last 10 years: No If all of the above answers are "NO", then may proceed with Cephalosporin use.    Family History  Problem Relation Age of Onset  . Lung cancer Mother   . CAD Father   . Diabetes Father   . Lung cancer Maternal Aunt   . Diabetes Paternal Aunt   . Diabetes Paternal Uncle   . Diabetes Paternal Grandmother   . Diabetes Paternal Grandfather   . Colon cancer Neg Hx   . Esophageal cancer Neg Hx   . Inflammatory bowel disease Neg Hx   . Liver disease Neg Hx   . Pancreatic cancer Neg Hx   . Rectal cancer Neg Hx   . Stomach cancer Neg Hx    PE: BP 120/78 (BP Location: Right Arm, Patient Position: Sitting, Cuff Size: Small)   Pulse (!) 102   Ht 4\' 11"  (1.499 m)   Wt 121 lb 12.8 oz (55.2 kg)   LMP  (LMP Unknown)   SpO2 96%   BMI 24.60 kg/m  Body mass index is 24.6 kg/m. Wt Readings from Last 3 Encounters:  06/12/20 121 lb 12.8 oz (55.2 kg)  05/19/20 121 lb 12.8 oz (  55.2 kg)  05/07/20 119 lb 11.4 oz (54.3 kg)   Constitutional: normal weight, in NAD Eyes: PERRLA, EOMI, no exophthalmos ENT: moist mucous membranes, no thyromegaly, no cervical lymphadenopathy Cardiovascular: tachycardia, RR, No MRG, + bilateral lower extremity edema Respiratory: CTA B Gastrointestinal: abdomen soft, NT, ND, BS+ Musculoskeletal: + finger deformities ([place] and Bouchard nodules), strength intact in all 4 Skin: moist, warm, no rashes Neurological: + tremor with outstretched hands, DTR normal in all 4  Assessment: 1. H/o Hyperparathyroidism - recent normocalcemia  2. Vitamin D def  3. DM2, insulin-dependent, controlled, without complications  4. Low TSH  5.  Osteoporosis  Plan: 1. HPTH and 2. Vit D def -Patient with history of hyperparathyroidism in the setting of low calcium, with PTH levels normalizing after correction of calcium levels.   She continues on Prolia injections, calcium, and vitamin D supplements -Latest  vitamin D level was at the upper limit of normal in 11/2019: 99.3.  At that time, I advised her to decrease the dose of vitamin D from 4000 to 2000 units daily.  Vitamin D level in 01/2020 and at this point, vitamin D was elevated, at 101.47. -We will recheck a vitamin D level now.  After the results are back, we most likely will reduce her calcium-vitamin D supplement and possibly stop the vitamin D  supplement also  3. DM2 - Patient with longstanding, previously uncontrolled type 2 diabetes, on basal insulin only.  We were able to stop the NovoLog if her sugars improved and her HbA1c decreased.  We discussed about checking blood sugars at different times of the day and writing them down.  At last visit sugars were still at goal and an HbA1c was 6.3%.  Since then, she had another HbA1c, 5.9% last month. -At today's visit, sugars are mostly at goal in the morning but with occasional lows in the 40s and 50s and they are higher at night, with some values in the 200s.  In the last week, however, they have been more controlled at night.  -Patient tells me that Dr. Sharlet Salina currently reduced her Lantus to 11 units in the morning.  At this visit, we will reduce it further.  I did advise her to let me know if the sugars remain higher than 200 at night, in which case, we will need to add back NovoLog, most likely. - I advised her to  Patient Instructions  Please decrease: - Lantus 8 units in am  Please let me know if the sugars remain >200 at bedtime.  Continue levothyroxine 50 mcg daily.  Take the thyroid hormone every day, with water, at least 30 minutes before breakfast.  Continue vitamin D 2000 units daily.  Please stop at the lab.  You need a bone density test.  Check sugars 1-2x a day, rotating check times.  Please come back for a follow-up appointment in 6 months.  - advised to check sugars at different times of the day - 1x a day, rotating check times - advised for yearly eye exams >>  she is UTD - return to clinic in 6 months  4.  Hypothyroidism -She has a history of hypothyroidism and is on levothyroxine 50 micrograms daily -I have repeatedly advised her to decrease the dose of levothyroxine from 50 to 25 mcg daily but she was reticent to decrease the dose for fear of hair loss, despite a discussion about bone and cardiovascular risks of thyrotoxicosis. -She has a history of low TSH levels in the past  but the latest was normal in 04/2020: 0.681.  This is most likely due to the fact that she is taking multivitamins, PPIs, and calcium along with levothyroxine, so she is most likely not absorbing much of the levothyroxine dose. -Therefore, for now we will continue the same dose of levothyroxine  5.  Osteoporosis -Reviewed latest DEXA scan results from 12/2017: Left femoral neck T score was -2.8, only slightly lower compared to 2016 when he was -2.6.  Right femoral neck could not be analyzed due to hardware. -She continues on Prolia-in PCPs office tolerated well.  No hip/thigh/jaw pain -Before last visit she had a chest x-ray which showed age indeterminate rib fractures of the third and fourth ribs.  Also, a left hip x-ray showed chronic fracture deformities in the bilateral superior and inferior pubic rami (these were previously known). -She is [facility] -per [facility]CP  Component     Latest Ref Rng & Units 06/12/2020  Vitamin D, 25-Hydroxy     30.0 - 100.0 ng/mL 89.5  Vitamin D level is normal.  We can continue the same dose of supplement, and also the multi vitamin but I would advise her to stop 1-vitamin D tablets.  Philemon Kingdom, MD PhD [facility] Endocrinology

## 2020-06-13 LAB — VITAMIN D 25 HYDROXY (VIT D DEFICIENCY, FRACTURES): Vit D, 25-Hydroxy: 89.5 ng/mL (ref 30.0–100.0)

## 2020-06-16 DIAGNOSIS — Z794 Long term (current) use of insulin: Secondary | ICD-10-CM | POA: Diagnosis not present

## 2020-06-16 DIAGNOSIS — E785 Hyperlipidemia, unspecified: Secondary | ICD-10-CM | POA: Diagnosis not present

## 2020-06-16 DIAGNOSIS — Z9181 History of falling: Secondary | ICD-10-CM | POA: Diagnosis not present

## 2020-06-16 DIAGNOSIS — E1165 Type 2 diabetes mellitus with hyperglycemia: Secondary | ICD-10-CM | POA: Diagnosis not present

## 2020-06-16 DIAGNOSIS — E43 Unspecified severe protein-calorie malnutrition: Secondary | ICD-10-CM | POA: Diagnosis not present

## 2020-06-16 DIAGNOSIS — E1159 Type 2 diabetes mellitus with other circulatory complications: Secondary | ICD-10-CM | POA: Diagnosis not present

## 2020-06-16 DIAGNOSIS — I5032 Chronic diastolic (congestive) heart failure: Secondary | ICD-10-CM | POA: Diagnosis not present

## 2020-06-16 DIAGNOSIS — Z602 Problems related to living alone: Secondary | ICD-10-CM | POA: Diagnosis not present

## 2020-06-16 DIAGNOSIS — E1169 Type 2 diabetes mellitus with other specified complication: Secondary | ICD-10-CM | POA: Diagnosis not present

## 2020-06-16 DIAGNOSIS — N390 Urinary tract infection, site not specified: Secondary | ICD-10-CM | POA: Diagnosis not present

## 2020-06-16 DIAGNOSIS — E1151 Type 2 diabetes mellitus with diabetic peripheral angiopathy without gangrene: Secondary | ICD-10-CM | POA: Diagnosis not present

## 2020-06-16 DIAGNOSIS — F32A Depression, unspecified: Secondary | ICD-10-CM | POA: Diagnosis not present

## 2020-06-16 DIAGNOSIS — F411 Generalized anxiety disorder: Secondary | ICD-10-CM | POA: Diagnosis not present

## 2020-06-16 DIAGNOSIS — J479 Bronchiectasis, uncomplicated: Secondary | ICD-10-CM | POA: Diagnosis not present

## 2020-06-16 DIAGNOSIS — I152 Hypertension secondary to endocrine disorders: Secondary | ICD-10-CM | POA: Diagnosis not present

## 2020-06-16 DIAGNOSIS — A419 Sepsis, unspecified organism: Secondary | ICD-10-CM | POA: Diagnosis not present

## 2020-06-18 ENCOUNTER — Other Ambulatory Visit: Payer: Self-pay | Admitting: Internal Medicine

## 2020-06-18 ENCOUNTER — Other Ambulatory Visit: Payer: Self-pay | Admitting: Gastroenterology

## 2020-06-19 ENCOUNTER — Ambulatory Visit (INDEPENDENT_AMBULATORY_CARE_PROVIDER_SITE_OTHER): Payer: Medicare Other

## 2020-06-19 ENCOUNTER — Other Ambulatory Visit: Payer: Self-pay

## 2020-06-19 VITALS — BP 120/70 | HR 99 | Temp 97.6°F | Ht 59.0 in | Wt 119.0 lb

## 2020-06-19 DIAGNOSIS — Z0000 Encounter for general adult medical examination without abnormal findings: Secondary | ICD-10-CM | POA: Diagnosis not present

## 2020-06-19 NOTE — Patient Instructions (Signed)
Kayla Thompson , Thank you for taking time to come for your Medicare Wellness Visit. I appreciate your ongoing commitment to your health goals. Please review the following plan we discussed and let me know if I can assist you in the future.   Screening recommendations/referrals: Colonoscopy: no repeat due to age 85: scheduled for 06/26/2020 Bone Density: 01/05/2018; due every 2 years Recommended yearly ophthalmology/optometry visit for glaucoma screening and checkup Recommended yearly dental visit for hygiene and checkup  Vaccinations: Influenza vaccine: 01/12/2020 Pneumococcal vaccine: up to date Tdap vaccine: 04/07/2017 Shingles vaccine: never done   Covid-19: up to date  Advanced directives: Please bring a copy of your health care power of attorney and living will to the office at your convenience.  Conditions/risks identified: Yes. Reviewed health maintenance screenings with patient today and relevant education, vaccines, and/or referrals were provided. Continue doing brain stimulating activities (puzzles, reading, adult coloring books, staying active) to keep memory sharp. Continue to eat heart healthy diet (full of fruits, vegetables, whole grains, lean protein, water--limit salt, fat, and sugar intake) and increase physical activity as tolerated.  Next appointment: Please schedule your next Medicare Wellness Visit with your Nurse Health Advisor in 1 year by calling [phone].   Preventive Care 77 Years and Older, Female Preventive care refers to lifestyle choices and visits with your health care provider that can promote health and wellness. What does preventive care include?  A yearly physical exam. This is also called an annual well check.  Dental exams once or twice a year.  Routine eye exams. Ask your health care provider how often you should have your eyes checked.  Personal lifestyle choices, including:  Daily care of your teeth and gums.  Regular physical  activity.  Eating a healthy diet.  Avoiding tobacco and drug use.  Limiting alcohol use.  Practicing safe sex.  Taking low-dose aspirin every day.  Taking vitamin and mineral supplements as recommended by your health care provider. What happens during an annual well check? The services and screenings done by your health care provider during your annual well check will depend on your age, overall health, lifestyle risk factors, and family history of disease. Counseling  Your health care provider may ask you questions about your:  Alcohol use.  Tobacco use.  Drug use.  Emotional well-being.  Home and relationship well-being.  Sexual activity.  Eating habits.  History of falls.  Memory and ability to understand (cognition).  Work and work Statistician.  Reproductive health. Screening  You may have the following tests or measurements:  Height, weight, and BMI.  Blood pressure.  Lipid and cholesterol levels. These may be checked every 5 years, or more frequently if you are over 70 years old.  Skin check.  Lung cancer screening. You may have this screening every year starting at age 28 if you have a 30-pack-year history of smoking and currently smoke or have quit within the past 15 years.  Fecal occult blood test (FOBT) of the stool. You may have this test every year starting at age 81.  Flexible sigmoidoscopy or colonoscopy. You may have a sigmoidoscopy every 5 years or a colonoscopy every 10 years starting at age 41.  Hepatitis C blood test.  Hepatitis B blood test.  Sexually transmitted disease (STD) testing.  Diabetes screening. This is done by checking your blood sugar (glucose) after you have not eaten for a while (fasting). You may have this done every 1-3 years.  Bone density scan. This is done to  screen for osteoporosis. You may have this done starting at age 55.  Mammogram. This may be done every 1-2 years. Talk to your health care provider about  how often you should have regular mammograms. Talk with your health care provider about your test results, treatment options, and if necessary, the need for more tests. Vaccines  Your health care provider may recommend certain vaccines, such as:  Influenza vaccine. This is recommended every year.  Tetanus, diphtheria, and acellular pertussis (Tdap, Td) vaccine. You may need a Td booster every 10 years.  Zoster vaccine. You may need this after age 54.  Pneumococcal 13-valent conjugate (PCV13) vaccine. One dose is recommended after age 32.  Pneumococcal polysaccharide (PPSV23) vaccine. One dose is recommended after age 73. Talk to your health care provider about which screenings and vaccines you need and how often you need them. This information is not intended to replace advice given to you by your health care provider. Make sure you discuss any questions you have with your health care provider. Document Released: 04/04/2015 Document Revised: 11/26/2015 Document Reviewed: 01/07/2015 Elsevier Interactive Patient Education  2017 [place] Prevention in the Home Falls can cause injuries. They can happen to people of all ages. There are many things you can do to make your home safe and to help prevent falls. What can I do on the outside of my home?  Regularly fix the edges of walkways and driveways and fix any cracks.  Remove anything that might make you trip as you walk through a door, such as a raised step or threshold.  Trim any bushes or trees on the path to your home.  Use bright outdoor lighting.  Clear any walking paths of anything that might make someone trip, such as rocks or tools.  Regularly check to see if handrails are loose or broken. Make sure that both sides of any steps have handrails.  Any raised decks and porches should have guardrails on the edges.  Have any leaves, snow, or ice cleared regularly.  Use sand or salt on walking paths during  winter.  Clean up any spills in your garage right away. This includes oil or grease spills. What can I do in the bathroom?  Use night lights.  Install grab bars by the toilet and in the tub and shower. Do not use towel bars as grab bars.  Use non-skid mats or decals in the tub or shower.  If you need to sit down in the shower, use a plastic, non-slip stool.  Keep the floor dry. Clean up any water that spills on the floor as soon as it happens.  Remove soap buildup in the tub or shower regularly.  Attach bath mats securely with double-sided non-slip rug tape.  Do not have throw rugs and other things on the floor that can make you trip. What can I do in the bedroom?  Use night lights.  Make sure that you have a light by your bed that is easy to reach.  Do not use any sheets or blankets that are too big for your bed. They should not hang down onto the floor.  Have a firm chair that has side arms. You can use this for support while you get dressed.  Do not have throw rugs and other things on the floor that can make you trip. What can I do in the kitchen?  Clean up any spills right away.  Avoid walking on wet floors.  Keep items that  you use a lot in easy-to-reach places.  If you need to reach something above you, use a strong step stool that has a grab bar.  Keep electrical cords out of the way.  Do not use floor polish or wax that makes floors slippery. If you must use wax, use non-skid floor wax.  Do not have throw rugs and other things on the floor that can make you trip. What can I do with my stairs?  Do not leave any items on the stairs.  Make sure that there are handrails on both sides of the stairs and use them. Fix handrails that are broken or loose. Make sure that handrails are as long as the stairways.  Check any carpeting to make sure that it is firmly attached to the stairs. Fix any carpet that is loose or worn.  Avoid having throw rugs at the top or  bottom of the stairs. If you do have throw rugs, attach them to the floor with carpet tape.  Make sure that you have a light switch at the top of the stairs and the bottom of the stairs. If you do not have them, ask someone to add them for you. What else can I do to help prevent falls?  Wear shoes that:  Do not have high heels.  Have rubber bottoms.  Are comfortable and fit you well.  Are closed at the toe. Do not wear sandals.  If you use a stepladder:  Make sure that it is fully opened. Do not climb a closed stepladder.  Make sure that both sides of the stepladder are locked into place.  Ask someone to hold it for you, if possible.  Clearly mark and make sure that you can see:  Any grab bars or handrails.  First and last steps.  Where the edge of each step is.  Use tools that help you move around (mobility aids) if they are needed. These include:  Canes.  Walkers.  Scooters.  Crutches.  Turn on the lights when you go into a dark area. Replace any light bulbs as soon as they burn out.  Set up your furniture so you have a clear path. Avoid moving your furniture around.  If any of your floors are uneven, fix them.  If there are any pets around you, be aware of where they are.  Review your medicines with your doctor. Some medicines can make you feel dizzy. This can increase your chance of falling. Ask your doctor what other things that you can do to help prevent falls. This information is not intended to replace advice given to you by your health care provider. Make sure you discuss any questions you have with your health care provider. Document Released: 01/02/2009 Document Revised: 08/14/2015 Document Reviewed: 04/12/2014 Elsevier Interactive Patient Education  2017 [employer].

## 2020-06-19 NOTE — Progress Notes (Signed)
Subjective:   Kayla Thompson is a 85 y.o. female who presents for Medicare Annual (Subsequent) preventive examination.  Review of Systems    No ROS. Medicare Wellness Visit. Additional risk factors are reflected in social history. Cardiac Risk Factors include: advanced age (>32men, >73 women);diabetes mellitus;dyslipidemia     Objective:    Today's Vitals   06/19/20 1430  BP: 120/70  Pulse: 99  Temp: 97.6 F (36.4 C)  SpO2: 98%  Weight: 119 lb (54 kg)  Height: 4\' 11"  (1.499 m)   Body mass index is 24.04 kg/m.  Advanced Directives 06/19/2020 05/07/2020 05/07/2020 01/12/2020 01/12/2020 01/12/2020 10/12/2019  Does Patient Have a Medical Advance Directive? Yes Yes Yes Yes - Yes Yes  Type of Advance Directive Living will;Healthcare Power of [place];Living will - [place];Living will Living will;Healthcare Power of Attorney  Does patient want to make changes to medical advance directive? No - Patient declined No - Patient declined - No - Patient declined - - -  Copy of [place] in Chart? No - copy requested No - copy requested - - No - copy requested No - copy requested -  Would patient like information on creating a medical advance directive? - - - - - - -    Current Medications (verified) Outpatient Encounter Medications as of 06/19/2020  Medication Sig  . albuterol (PROAIR HFA) 108 (90 Base) MCG/ACT inhaler Inhale 2 puffs into the lungs every 6 (six) hours as needed for wheezing or shortness of breath.  Marland Kitchen amitriptyline (ELAVIL) 10 MG tablet TAKE 1 TABLET BY MOUTH EVERYDAY AT BEDTIME  . BD PEN NEEDLE NANO 2ND GEN 32G X 4 MM MISC USE AS DIRECTED 4 TIMES A DAY  . busPIRone (BUSPAR) 5 MG tablet TAKE 1 TO 2 TABLETS BY MOUTH TWICE A DAY (Patient taking differently: Take 5-10 mg by mouth as needed.)  . clotrimazole-betamethasone (LOTRISONE) cream APPLY TO AFFECTED AREA TWICE A DAY  (Patient taking differently: Apply 1 application topically 2 (two) times daily.)  . DULoxetine (CYMBALTA) 30 MG capsule Take 1 capsule (30 mg total) by mouth daily.  . DULoxetine (CYMBALTA) 60 MG capsule Take 1 capsule (60 mg total) by mouth daily.  . feeding supplement (ENSURE ENLIVE / ENSURE PLUS) LIQD Take 237 mLs by mouth 2 (two) times daily between meals.  . Fluticasone-Umeclidin-Vilant (TRELEGY ELLIPTA) 100-62.5-25 MCG/INH AEPB Inhale 1 puff into the lungs daily. Rinse mouth  . glucose blood (FREESTYLE LITE) test strip USE TO TEST BLOOD SUGAR 3 TIMES DAILY. DX: E11.65  . HYDROcodone-acetaminophen (NORCO) 10-325 MG tablet Take 1 tablet by mouth every 4 (four) hours as needed for severe pain.  Marland Kitchen insulin glargine (LANTUS SOLOSTAR) 100 UNIT/ML Solostar Pen Inject 8 Units into the skin daily.  Marland Kitchen ipratropium-albuterol (DUONEB) 0.5-2.5 (3) MG/3ML SOLN Inhale 3 mLs into the lungs 3 (three) times daily as needed (shortness of breath). USE 1 VIAL IN NEBULIZER 3 TIMES DAILY  . Lancets (FREESTYLE) lancets Use to test blood sugar 3 times daily. Dx: E11.65  . levothyroxine (SYNTHROID) 50 MCG tablet TAKE 1 TABLET BY MOUTH EVERY DAY BEFORE BREAKFAST  . lidocaine (XYLOCAINE) 5 % ointment Apply 1 application topically as needed. Apply to external vulvar area  . magnesium oxide (MAG-OX) 400 MG tablet TAKE 1 TABLET BY MOUTH EVERY DAY (Patient taking differently: Take 400 mg by mouth daily.)  . Misc Natural Products (TART CHERRY ADVANCED PO) Take by mouth.  Once daily  . montelukast (SINGULAIR) 10 MG tablet TAKE 1 TABLET BY MOUTH EVERY DAY (Patient taking differently: Take 10 mg by mouth daily.)  . omeprazole (PRILOSEC) 20 MG capsule TAKE 1 CAPSULE BY MOUTH 2 TIMES A DAY BEFORE A MEAL  . predniSONE (DELTASONE) 1 MG tablet Take 2 tablets (2 mg total) by mouth daily.  . Pseudoephedrine-guaiFENesin (MUCINEX D MAX STRENGTH PO) Take 2,400 mg by mouth daily.  . rosuvastatin (CRESTOR) 10 MG tablet Take 1 tablet (10 mg  total) by mouth every evening.  Marland Kitchen UNABLE TO FIND Take 1,000 mg by mouth daily. Med Name: Tumeric  . [DISCONTINUED] baclofen (LIORESAL) 10 MG tablet TAKE 0.5-1 TABLET BY MOUTH TWICE DAILY AS NEEDED FOR MUSCLE SPASMS OR PAIN (Patient taking differently: Take 5-10 mg by mouth 2 (two) times daily as needed for muscle spasms.)   No facility-administered encounter medications on file as of 06/19/2020.    Allergies (verified) Latex and Penicillins   History: Past Medical History:  Diagnosis Date  . Anxiety   . Arthritis   . Asthma   . Cataracts, bilateral    removed by surgery  . COPD (chronic obstructive pulmonary disease) ([place])   . Depression   . Diabetes mellitus without complication ([place])    type 2  . Dyspnea    with exertion  . Fibromyalgia   . GERD (gastroesophageal reflux disease)   . History of chemotherapy   . History of fractured pelvis   . Hypertension   . Hypothyroidism   . Osteoporosis 12/2017   T score -2.8 stable from prior DEXA  . Osteoporosis   . Ovarian cancer ([place])   . Pneumonia    several times   Past Surgical History:  Procedure Laterality Date  . ABDOMINAL HYSTERECTOMY  1996  . ANKLE FRACTURE SURGERY     plate and screws  . BIOPSY  02/05/2019   Procedure: BIOPSY;  Surgeon: Rush Landmark Telford Nab., MD;  Location: [facility];  Service: Gastroenterology;;  . BLADDER SUSPENSION    . CATARACT EXTRACTION    . COLONOSCOPY    . ESOPHAGEAL BRUSHING  02/05/2019   Procedure: ESOPHAGEAL BRUSHING;  Surgeon: Rush Landmark Telford Nab., MD;  Location: [facility];  Service: Gastroenterology;;  . ESOPHAGOGASTRODUODENOSCOPY (EGD) WITH PROPOFOL N/A 02/05/2019   Procedure: ESOPHAGOGASTRODUODENOSCOPY (EGD) WITH PROPOFOL with dialtion;  Surgeon: Irving Copas., MD;  Location: [facility];  Service: Gastroenterology;  Laterality: N/A;  . EYE SURGERY     cataracts removed-bilateral  . hip surgey    . knee surgey    . LUNG SURGERY    . OOPHORECTOMY     BSO  .  SAVORY DILATION N/A 02/05/2019   Procedure: SAVORY DILATION;  Surgeon: Rush Landmark Telford Nab., MD;  Location: [facility];  Service: Gastroenterology;  Laterality: N/A;  . TONSILLECTOMY    . UPPER GI ENDOSCOPY     Family History  Problem Relation Age of Onset  . Lung cancer Mother   . CAD Father   . Diabetes Father   . Lung cancer Maternal Aunt   . Diabetes Paternal Aunt   . Diabetes Paternal Uncle   . Diabetes Paternal Grandmother   . Diabetes Paternal Grandfather   . Colon cancer Neg Hx   . Esophageal cancer Neg Hx   . Inflammatory bowel disease Neg Hx   . Liver disease Neg Hx   . Pancreatic cancer Neg Hx   . Rectal cancer Neg Hx   . Stomach cancer Neg Hx    Social  History   Socioeconomic History  . Marital status: Widowed    Spouse name: Not on file  . Number of children: 2  . Years of education: Not on file  . Highest education level: Not on file  Occupational History  . Not on file  Tobacco Use  . Smoking status: Never Smoker  . Smokeless tobacco: Never Used  Vaping Use  . Vaping Use: Never used  Substance and Sexual Activity  . Alcohol use: No    Alcohol/week: 0.0 standard drinks  . Drug use: No  . Sexual activity: Not Currently    Birth control/protection: Post-menopausal    Comment: declined sexual Hx questions  Other Topics Concern  . Not on file  Social History Narrative  . Not on file   Social Determinants of Health   Financial Resource Strain: Low Risk   . Difficulty of Paying Living Expenses: Not hard at all  Food Insecurity: No Food Insecurity  . Worried About Charity fundraiser in the Last Year: Never true  . Ran Out of Food in the Last Year: Never true  Transportation Needs: No Transportation Needs  . Lack of Transportation (Medical): No  . Lack of Transportation (Non-Medical): No  Physical Activity: Insufficiently Active  . Days of Exercise per Week: 2 days  . Minutes of Exercise per Session: 30 min  Stress: No Stress Concern Present   . Feeling of Stress : Not at all  Social Connections: Not on file    Tobacco Counseling Counseling given: Not Answered   Clinical Intake:  Pre-visit preparation completed: Yes  Pain : No/denies pain     BMI - recorded: 24.04 Nutritional Status: BMI of 19-24  Normal Nutritional Risks: None Diabetes: Yes CBG done?: No Did pt. bring in CBG monitor from home?: No  How often do you need to have someone help you when you read instructions, pamphlets, or other written materials from your doctor or pharmacy?: 1 - Never What is the last grade level you completed in school?: 2 years of college  Diabetic? yes  Interpreter Needed?: No  Information entered by :: Lisette Abu, LPN   Activities of Daily Living In your present state of health, do you have any difficulty performing the following activities: 06/19/2020 05/07/2020  Hearing? N N  Vision? N N  Difficulty concentrating or making decisions? N Y  Walking or climbing stairs? Y Y  Dressing or bathing? Y Y  Doing errands, shopping? Tempie Donning  Preparing Food and eating ? Y -  Using the Toilet? Y -  In the past six months, have you accidently leaked urine? Y -  Do you have problems with loss of bowel control? Y -  Managing your Medications? Y -  Managing your Finances? N -  Housekeeping or managing your Housekeeping? Y -  Some recent data might be hidden    Patient Care Team: [name], MD as PCP - General (Internal Medicine) Werner Lean, MD as PCP - Cardiology (Cardiology) Fontaine, Belinda Block, MD (Inactive) as Consulting Physician (Gynecology) Noralee Space, MD as Consulting Physician (Pulmonary Disease) Rutherford Guys, MD as Consulting Physician (Ophthalmology) Philemon Kingdom, MD as Consulting Physician (Internal Medicine)  Indicate any recent Medical Services you may have received from other than [facility] providers in the past year (date may be approximate).     Assessment:   This is a  routine wellness examination for Kayla Thompson.  Hearing/Vision screen No exam data present  Dietary issues and exercise activities  discussed: Current Exercise Habits: Home exercise routine, Type of exercise: Other - see comments (physical therapy 2 times a week for 30 minutes), Time (Minutes): 30, Frequency (Times/Week): 2, Weekly Exercise (Minutes/Week): 60, Intensity: Mild, Exercise limited by: cardiac condition(s)  Goals    . Patient Stated     Stay as healthy and as independent as possible      Depression Screen PHQ 2/9 Scores 06/19/2020 01/23/2020 01/08/2019 11/29/2017 12/01/2016 10/01/2015 03/28/2015  PHQ - 2 Score 0 0 1 2 2  0 1  PHQ- 9 Score - - - 5 7 - -    Fall Risk Fall Risk  06/19/2020 02/18/2020 01/08/2019 11/29/2017 12/01/2016  Falls in the past year? 1 1 0 Yes Yes  Number falls in past yr: 0 1 - 2 or more 1  Injury with Fall? 1 1 - Yes Yes  Risk Factor Category  - - - High Fall Risk -  Risk for fall due to : Impaired balance/gait Impaired mobility;Impaired balance/gait;History of fall(s) - - -  Follow up Falls evaluation completed;Education provided;Falls prevention discussed Falls prevention discussed;Education provided - Education provided;Falls prevention discussed -    FALL RISK PREVENTION PERTAINING TO THE HOME:  Any stairs in or around the home? No  If so, are there any without handrails? No  Home free of loose throw rugs in walkways, pet beds, electrical cords, etc? Yes  Adequate lighting in your home to reduce risk of falls? Yes   ASSISTIVE DEVICES UTILIZED TO PREVENT FALLS:  Life alert? Yes  Use of a cane, walker or w/c? Yes  Grab bars in the bathroom? Yes  Shower chair or bench in shower? Yes  Elevated toilet seat or a handicapped toilet? Yes   TIMED UP AND GO:  Was the test performed? No .  Length of time to ambulate 10 feet: 0 sec.   Gait steady and fast with assistive device  Cognitive Function: Normal cognitive status assessed by direct observation by  this Nurse Health Advisor. No abnormalities found.   MMSE - Mini Mental State Exam 11/29/2017  Not completed: Refused        Immunizations Immunization History  Administered Date(s) Administered  . Fluad Quad(high Dose 65+) 01/08/2019, 01/01/2020  . Influenza, High Dose Seasonal PF 12/01/2016, 11/29/2017  . Influenza,inj,Quad PF,6+ Mos 01/17/2015, 12/18/2015  . PFIZER(Purple Top)SARS-COV-2 Vaccination 03/26/2019, 04/23/2019, 03/13/2020  . Pneumococcal Conjugate-13 02/26/2019  . Pneumococcal Polysaccharide-23 12/18/2015  . Tdap 08/02/2014, 04/07/2017    TDAP status: Up to date  Flu Vaccine status: Up to date  Pneumococcal vaccine status: Up to date  Covid-19 vaccine status: Completed vaccines  Qualifies for Shingles Vaccine? Yes   Zostavax completed No   Shingrix Completed?: No.    Education has been provided regarding the importance of this vaccine. Patient has been advised to call insurance company to determine out of pocket expense if they have not yet received this vaccine. Advised may also receive vaccine at local pharmacy or Health Dept. Verbalized acceptance and understanding.  Screening Tests Health Maintenance  Topic Date Due  . FOOT EXAM  01/08/2020  . URINE MICROALBUMIN  01/08/2020  . OPHTHALMOLOGY EXAM  09/30/2020  . TETANUS/TDAP  04/08/2027  . INFLUENZA VACCINE  Completed  . DEXA SCAN  Completed  . COVID-19 Vaccine  Completed  . PNA vac Low Risk Adult  Addressed  . HPV VACCINES  Aged Out    Health Maintenance  Health Maintenance Due  Topic Date Due  . FOOT EXAM  01/08/2020  .  URINE MICROALBUMIN  01/08/2020    Colorectal cancer screening: No longer required.   Mammogram status: Completed 03/08/2019. Repeat every year  (scheduled for 06/26/2020)  Bone Density status: Completed 01/05/2018. Results reflect: Bone density results: OSTEOPOROSIS. Repeat every 2 years.  Lung Cancer Screening: (Low Dose CT Chest recommended if Age 51-80 years, 30 pack-year  currently smoking OR have quit w/in 15years.) does not qualify.   Lung Cancer Screening Referral: no  Additional Screening:  Hepatitis C Screening: does not qualify; Completed no  Vision Screening: Recommended annual ophthalmology exams for early detection of glaucoma and other disorders of the eye. Is the patient up to date with their annual eye exam?  Yes  Who is the provider or what is the name of the office in which the patient attends annual eye exams? Rutherford Guys, MD. If pt is not established with a provider, would they like to be referred to a provider to establish care? No .   Dental Screening: Recommended annual dental exams for proper oral hygiene  Community Resource Referral / Chronic Care Management: CRR required this visit?  No   CCM required this visit?  No      Plan:     I have personally reviewed and noted the following in the patient's chart:   . Medical and social history . Use of alcohol, tobacco or illicit drugs  . Current medications and supplements . Functional ability and status . Nutritional status . Physical activity . Advanced directives . List of other physicians . Hospitalizations, surgeries, and ER visits in previous 12 months . Vitals . Screenings to include cognitive, depression, and falls . Referrals and appointments  In addition, I have reviewed and discussed with patient certain preventive protocols, quality metrics, and best practice recommendations. A written personalized care plan for preventive services as well as general preventive health recommendations were provided to patient.     Sheral Flow, LPN   [birth year]   Nurse Notes:  Medications reviewed with patient; opioid use noted.

## 2020-06-20 DIAGNOSIS — A419 Sepsis, unspecified organism: Secondary | ICD-10-CM | POA: Diagnosis not present

## 2020-06-20 DIAGNOSIS — Z79899 Other long term (current) drug therapy: Secondary | ICD-10-CM | POA: Diagnosis not present

## 2020-06-20 DIAGNOSIS — E1159 Type 2 diabetes mellitus with other circulatory complications: Secondary | ICD-10-CM | POA: Diagnosis not present

## 2020-06-20 DIAGNOSIS — E1151 Type 2 diabetes mellitus with diabetic peripheral angiopathy without gangrene: Secondary | ICD-10-CM | POA: Diagnosis not present

## 2020-06-20 DIAGNOSIS — I5032 Chronic diastolic (congestive) heart failure: Secondary | ICD-10-CM | POA: Diagnosis not present

## 2020-06-20 DIAGNOSIS — N390 Urinary tract infection, site not specified: Secondary | ICD-10-CM | POA: Diagnosis not present

## 2020-06-20 DIAGNOSIS — M542 Cervicalgia: Secondary | ICD-10-CM | POA: Diagnosis not present

## 2020-06-20 DIAGNOSIS — I152 Hypertension secondary to endocrine disorders: Secondary | ICD-10-CM | POA: Diagnosis not present

## 2020-06-24 DIAGNOSIS — E1151 Type 2 diabetes mellitus with diabetic peripheral angiopathy without gangrene: Secondary | ICD-10-CM | POA: Diagnosis not present

## 2020-06-24 DIAGNOSIS — A419 Sepsis, unspecified organism: Secondary | ICD-10-CM | POA: Diagnosis not present

## 2020-06-24 DIAGNOSIS — I152 Hypertension secondary to endocrine disorders: Secondary | ICD-10-CM | POA: Diagnosis not present

## 2020-06-24 DIAGNOSIS — I5032 Chronic diastolic (congestive) heart failure: Secondary | ICD-10-CM | POA: Diagnosis not present

## 2020-06-24 DIAGNOSIS — E1159 Type 2 diabetes mellitus with other circulatory complications: Secondary | ICD-10-CM | POA: Diagnosis not present

## 2020-06-24 DIAGNOSIS — N390 Urinary tract infection, site not specified: Secondary | ICD-10-CM | POA: Diagnosis not present

## 2020-06-26 ENCOUNTER — Ambulatory Visit
Admit: 2020-06-26 | Discharge: 2020-06-26 | Disposition: A | Discharge status: Home / Self Care | Payer: Medicare Other | Attending: Obstetrics and Gynecology | Admitting: Obstetrics and Gynecology

## 2020-06-26 ENCOUNTER — Other Ambulatory Visit: Payer: Self-pay

## 2020-06-26 DIAGNOSIS — N390 Urinary tract infection, site not specified: Secondary | ICD-10-CM | POA: Diagnosis not present

## 2020-06-26 DIAGNOSIS — F411 Generalized anxiety disorder: Secondary | ICD-10-CM

## 2020-06-26 DIAGNOSIS — E1159 Type 2 diabetes mellitus with other circulatory complications: Secondary | ICD-10-CM | POA: Diagnosis not present

## 2020-06-26 DIAGNOSIS — Z794 Long term (current) use of insulin: Secondary | ICD-10-CM

## 2020-06-26 DIAGNOSIS — E1169 Type 2 diabetes mellitus with other specified complication: Secondary | ICD-10-CM | POA: Diagnosis not present

## 2020-06-26 DIAGNOSIS — F32A Depression, unspecified: Secondary | ICD-10-CM | POA: Diagnosis not present

## 2020-06-26 DIAGNOSIS — E785 Hyperlipidemia, unspecified: Secondary | ICD-10-CM | POA: Diagnosis not present

## 2020-06-26 DIAGNOSIS — A419 Sepsis, unspecified organism: Secondary | ICD-10-CM | POA: Diagnosis not present

## 2020-06-26 DIAGNOSIS — I152 Hypertension secondary to endocrine disorders: Secondary | ICD-10-CM | POA: Diagnosis not present

## 2020-06-26 DIAGNOSIS — E43 Unspecified severe protein-calorie malnutrition: Secondary | ICD-10-CM | POA: Diagnosis not present

## 2020-06-26 DIAGNOSIS — I5032 Chronic diastolic (congestive) heart failure: Secondary | ICD-10-CM | POA: Diagnosis not present

## 2020-06-26 DIAGNOSIS — Z9181 History of falling: Secondary | ICD-10-CM

## 2020-06-26 DIAGNOSIS — Z1231 Encounter for screening mammogram for malignant neoplasm of breast: Secondary | ICD-10-CM | POA: Diagnosis not present

## 2020-06-26 DIAGNOSIS — J479 Bronchiectasis, uncomplicated: Secondary | ICD-10-CM | POA: Diagnosis not present

## 2020-06-26 DIAGNOSIS — Z602 Problems related to living alone: Secondary | ICD-10-CM

## 2020-06-26 DIAGNOSIS — E1165 Type 2 diabetes mellitus with hyperglycemia: Secondary | ICD-10-CM | POA: Diagnosis not present

## 2020-06-26 DIAGNOSIS — E1151 Type 2 diabetes mellitus with diabetic peripheral angiopathy without gangrene: Secondary | ICD-10-CM | POA: Diagnosis not present

## 2020-06-30 ENCOUNTER — Telehealth: Payer: Self-pay | Admitting: Internal Medicine

## 2020-06-30 NOTE — Telephone Encounter (Signed)
Patient's granddaughter [place] calling to request order for urine test. She states patient has UTI symptoms again   Please advise

## 2020-07-01 ENCOUNTER — Telehealth: Payer: Self-pay | Admitting: Obstetrics and Gynecology

## 2020-07-01 NOTE — Telephone Encounter (Signed)
Unable to get in contact with the patient. LVM asking the patient to return my call here at the office. Office number was provided

## 2020-07-01 NOTE — Telephone Encounter (Signed)
Chart reviewed as patient's mammogram report resulted.  Dr. Delilah Shan recommended a pelvic ultrasound in October, 2021 to evaluate a mass her noted on examination.  She has a history of ovarian cancer and had her ovaries removed many years ago.   I do not see a pelvic US report in her chart.  I recommend she return for a pelvic exam and determination if she still needs this imaging.

## 2020-07-01 NOTE — Telephone Encounter (Signed)
Patients granddaughter calling again, wondering if we can order the urine test.

## 2020-07-02 NOTE — Telephone Encounter (Signed)
Left message voice mail for patient to call me.

## 2020-07-03 DIAGNOSIS — M19012 Primary osteoarthritis, left shoulder: Secondary | ICD-10-CM | POA: Diagnosis not present

## 2020-07-03 DIAGNOSIS — N390 Urinary tract infection, site not specified: Secondary | ICD-10-CM | POA: Diagnosis not present

## 2020-07-03 DIAGNOSIS — M19011 Primary osteoarthritis, right shoulder: Secondary | ICD-10-CM | POA: Diagnosis not present

## 2020-07-04 DIAGNOSIS — A419 Sepsis, unspecified organism: Secondary | ICD-10-CM | POA: Diagnosis not present

## 2020-07-04 DIAGNOSIS — I5032 Chronic diastolic (congestive) heart failure: Secondary | ICD-10-CM | POA: Diagnosis not present

## 2020-07-04 DIAGNOSIS — I152 Hypertension secondary to endocrine disorders: Secondary | ICD-10-CM | POA: Diagnosis not present

## 2020-07-04 DIAGNOSIS — E1151 Type 2 diabetes mellitus with diabetic peripheral angiopathy without gangrene: Secondary | ICD-10-CM | POA: Diagnosis not present

## 2020-07-04 DIAGNOSIS — N390 Urinary tract infection, site not specified: Secondary | ICD-10-CM | POA: Diagnosis not present

## 2020-07-04 DIAGNOSIS — E1159 Type 2 diabetes mellitus with other circulatory complications: Secondary | ICD-10-CM | POA: Diagnosis not present

## 2020-07-05 ENCOUNTER — Other Ambulatory Visit: Payer: Self-pay | Admitting: Internal Medicine

## 2020-07-05 DIAGNOSIS — J479 Bronchiectasis, uncomplicated: Secondary | ICD-10-CM

## 2020-07-07 ENCOUNTER — Telehealth: Payer: Self-pay | Admitting: Internal Medicine

## 2020-07-07 ENCOUNTER — Inpatient Hospital Stay (HOSPITAL_COMMUNITY)
Admission: EM | Admit: 2020-07-07 | Discharge: 2020-07-10 | DRG: 689 | Disposition: A | Discharge status: Home Health | Payer: Medicare Other | Attending: Internal Medicine | Admitting: Internal Medicine

## 2020-07-07 ENCOUNTER — Encounter (HOSPITAL_COMMUNITY): Payer: Self-pay

## 2020-07-07 ENCOUNTER — Ambulatory Visit: Payer: Medicare Other | Admitting: Internal Medicine

## 2020-07-07 ENCOUNTER — Emergency Department (HOSPITAL_COMMUNITY): Payer: Medicare Other

## 2020-07-07 ENCOUNTER — Other Ambulatory Visit: Payer: Self-pay

## 2020-07-07 DIAGNOSIS — Z794 Long term (current) use of insulin: Secondary | ICD-10-CM

## 2020-07-07 DIAGNOSIS — K219 Gastro-esophageal reflux disease without esophagitis: Secondary | ICD-10-CM | POA: Diagnosis present

## 2020-07-07 DIAGNOSIS — R4182 Altered mental status, unspecified: Secondary | ICD-10-CM | POA: Diagnosis present

## 2020-07-07 DIAGNOSIS — Z91040 Latex allergy status: Secondary | ICD-10-CM

## 2020-07-07 DIAGNOSIS — I152 Hypertension secondary to endocrine disorders: Secondary | ICD-10-CM | POA: Diagnosis not present

## 2020-07-07 DIAGNOSIS — Z1612 Extended spectrum beta lactamase (ESBL) resistance: Secondary | ICD-10-CM | POA: Diagnosis present

## 2020-07-07 DIAGNOSIS — I10 Essential (primary) hypertension: Secondary | ICD-10-CM

## 2020-07-07 DIAGNOSIS — F418 Other specified anxiety disorders: Secondary | ICD-10-CM | POA: Diagnosis not present

## 2020-07-07 DIAGNOSIS — E039 Hypothyroidism, unspecified: Secondary | ICD-10-CM | POA: Diagnosis present

## 2020-07-07 DIAGNOSIS — M810 Age-related osteoporosis without current pathological fracture: Secondary | ICD-10-CM | POA: Diagnosis present

## 2020-07-07 DIAGNOSIS — Z20822 Contact with and (suspected) exposure to covid-19: Secondary | ICD-10-CM | POA: Diagnosis present

## 2020-07-07 DIAGNOSIS — R399 Unspecified symptoms and signs involving the genitourinary system: Secondary | ICD-10-CM

## 2020-07-07 DIAGNOSIS — Z79890 Hormone replacement therapy (postmenopausal): Secondary | ICD-10-CM

## 2020-07-07 DIAGNOSIS — R410 Disorientation, unspecified: Principal | ICD-10-CM

## 2020-07-07 DIAGNOSIS — E785 Hyperlipidemia, unspecified: Secondary | ICD-10-CM | POA: Diagnosis present

## 2020-07-07 DIAGNOSIS — B9620 Unspecified Escherichia coli [E. coli] as the cause of diseases classified elsewhere: Secondary | ICD-10-CM | POA: Diagnosis present

## 2020-07-07 DIAGNOSIS — M797 Fibromyalgia: Secondary | ICD-10-CM | POA: Diagnosis present

## 2020-07-07 DIAGNOSIS — I110 Hypertensive heart disease with heart failure: Secondary | ICD-10-CM | POA: Diagnosis present

## 2020-07-07 DIAGNOSIS — M1990 Unspecified osteoarthritis, unspecified site: Secondary | ICD-10-CM | POA: Diagnosis present

## 2020-07-07 DIAGNOSIS — N390 Urinary tract infection, site not specified: Principal | ICD-10-CM | POA: Diagnosis present

## 2020-07-07 DIAGNOSIS — Z8249 Family history of ischemic heart disease and other diseases of the circulatory system: Secondary | ICD-10-CM

## 2020-07-07 DIAGNOSIS — Z66 Do not resuscitate: Secondary | ICD-10-CM | POA: Diagnosis present

## 2020-07-07 DIAGNOSIS — E43 Unspecified severe protein-calorie malnutrition: Secondary | ICD-10-CM | POA: Diagnosis present

## 2020-07-07 DIAGNOSIS — I5032 Chronic diastolic (congestive) heart failure: Secondary | ICD-10-CM | POA: Diagnosis present

## 2020-07-07 DIAGNOSIS — Z833 Family history of diabetes mellitus: Secondary | ICD-10-CM

## 2020-07-07 DIAGNOSIS — Z6828 Body mass index (BMI) 28.0-28.9, adult: Secondary | ICD-10-CM

## 2020-07-07 DIAGNOSIS — B3741 Candidal cystitis and urethritis: Secondary | ICD-10-CM | POA: Diagnosis present

## 2020-07-07 DIAGNOSIS — D539 Nutritional anemia, unspecified: Secondary | ICD-10-CM | POA: Diagnosis present

## 2020-07-07 DIAGNOSIS — F32A Depression, unspecified: Secondary | ICD-10-CM | POA: Diagnosis present

## 2020-07-07 DIAGNOSIS — E1151 Type 2 diabetes mellitus with diabetic peripheral angiopathy without gangrene: Secondary | ICD-10-CM | POA: Diagnosis present

## 2020-07-07 DIAGNOSIS — Z8543 Personal history of malignant neoplasm of ovary: Secondary | ICD-10-CM

## 2020-07-07 DIAGNOSIS — J449 Chronic obstructive pulmonary disease, unspecified: Secondary | ICD-10-CM | POA: Diagnosis present

## 2020-07-07 DIAGNOSIS — G9341 Metabolic encephalopathy: Secondary | ICD-10-CM | POA: Diagnosis present

## 2020-07-07 DIAGNOSIS — Z79899 Other long term (current) drug therapy: Secondary | ICD-10-CM

## 2020-07-07 DIAGNOSIS — E1169 Type 2 diabetes mellitus with other specified complication: Secondary | ICD-10-CM | POA: Diagnosis not present

## 2020-07-07 DIAGNOSIS — E1159 Type 2 diabetes mellitus with other circulatory complications: Secondary | ICD-10-CM | POA: Diagnosis present

## 2020-07-07 DIAGNOSIS — Z7952 Long term (current) use of systemic steroids: Secondary | ICD-10-CM

## 2020-07-07 DIAGNOSIS — Z9221 Personal history of antineoplastic chemotherapy: Secondary | ICD-10-CM

## 2020-07-07 DIAGNOSIS — Z880 Allergy status to penicillin: Secondary | ICD-10-CM

## 2020-07-07 DIAGNOSIS — M353 Polymyalgia rheumatica: Secondary | ICD-10-CM | POA: Diagnosis present

## 2020-07-07 DIAGNOSIS — E872 Acidosis: Secondary | ICD-10-CM | POA: Diagnosis present

## 2020-07-07 DIAGNOSIS — Z801 Family history of malignant neoplasm of trachea, bronchus and lung: Secondary | ICD-10-CM

## 2020-07-07 DIAGNOSIS — R404 Transient alteration of awareness: Secondary | ICD-10-CM | POA: Diagnosis not present

## 2020-07-07 DIAGNOSIS — R5381 Other malaise: Secondary | ICD-10-CM | POA: Diagnosis not present

## 2020-07-07 DIAGNOSIS — Z7951 Long term (current) use of inhaled steroids: Secondary | ICD-10-CM

## 2020-07-07 DIAGNOSIS — E119 Type 2 diabetes mellitus without complications: Secondary | ICD-10-CM

## 2020-07-07 DIAGNOSIS — A419 Sepsis, unspecified organism: Secondary | ICD-10-CM | POA: Diagnosis not present

## 2020-07-07 LAB — CBC WITH DIFFERENTIAL/PLATELET
Abs Immature Granulocytes: 0.19 10*3/uL — ABNORMAL HIGH (ref 0.00–0.07)
Basophils Absolute: 0.1 10*3/uL (ref 0.0–0.1)
Basophils Relative: 0 %
Eosinophils Absolute: 0.3 10*3/uL (ref 0.0–0.5)
Eosinophils Relative: 2 %
HCT: 33.3 % — ABNORMAL LOW (ref 36.0–46.0)
Hemoglobin: 10.9 g/dL — ABNORMAL LOW (ref 12.0–15.0)
Immature Granulocytes: 1 %
Lymphocytes Relative: 17 %
Lymphs Abs: 3 10*3/uL (ref 0.7–4.0)
MCH: 38.2 pg — ABNORMAL HIGH (ref 26.0–34.0)
MCHC: 32.7 g/dL (ref 30.0–36.0)
MCV: 116.8 fL — ABNORMAL HIGH (ref 80.0–100.0)
Monocytes Absolute: 0.6 10*3/uL (ref 0.1–1.0)
Monocytes Relative: 3 %
Neutro Abs: 13.6 10*3/uL — ABNORMAL HIGH (ref 1.7–7.7)
Neutrophils Relative %: 77 %
Platelets: 352 10*3/uL (ref 150–400)
RBC: 2.85 MIL/uL — ABNORMAL LOW (ref 3.87–5.11)
RDW: 14.7 % (ref 11.5–15.5)
WBC: 17.7 10*3/uL — ABNORMAL HIGH (ref 4.0–10.5)
nRBC: 0 % (ref 0.0–0.2)

## 2020-07-07 LAB — URINALYSIS, ROUTINE W REFLEX MICROSCOPIC
Bilirubin Urine: NEGATIVE
Glucose, UA: NEGATIVE mg/dL
Hgb urine dipstick: NEGATIVE
Ketones, ur: 5 mg/dL — AB
Nitrite: NEGATIVE
Protein, ur: 100 mg/dL — AB
Specific Gravity, Urine: 1.028 (ref 1.005–1.030)
WBC, UA: >50 WBC/hpf — ABNORMAL HIGH (ref 0–5)
pH: 5 (ref 5.0–8.0)

## 2020-07-07 LAB — COMPREHENSIVE METABOLIC PANEL
ALT: 15 U/L (ref 0–44)
AST: 14 U/L — ABNORMAL LOW (ref 15–41)
Albumin: 3.3 g/dL — ABNORMAL LOW (ref 3.5–5.0)
Alkaline Phosphatase: 31 U/L — ABNORMAL LOW (ref 38–126)
Anion gap: 11 (ref 5–15)
BUN: 21 mg/dL (ref 8–23)
CO2: 19 mmol/L — ABNORMAL LOW (ref 22–32)
Calcium: 8.9 mg/dL (ref 8.9–10.3)
Chloride: 106 mmol/L (ref 98–111)
Creatinine, Ser: 1.05 mg/dL — ABNORMAL HIGH (ref 0.44–1.00)
GFR, Estimated: 53 mL/min — ABNORMAL LOW (ref >60)
Glucose, Bld: 104 mg/dL — ABNORMAL HIGH (ref 70–99)
Potassium: 4.1 mmol/L (ref 3.5–5.1)
Sodium: 136 mmol/L (ref 135–145)
Total Bilirubin: 0.7 mg/dL (ref 0.3–1.2)
Total Protein: 6.5 g/dL (ref 6.5–8.1)

## 2020-07-07 LAB — FOLATE: Folate: 34.7 ng/mL (ref >5.9)

## 2020-07-07 LAB — PROTIME-INR
INR: 1 (ref 0.8–1.2)
Prothrombin Time: 13.4 seconds (ref 11.4–15.2)

## 2020-07-07 LAB — GLUCOSE, CAPILLARY
Glucose-Capillary: 99 mg/dL (ref 70–99)
Glucose-Capillary: 174 mg/dL — ABNORMAL HIGH (ref 70–99)

## 2020-07-07 LAB — RESP PANEL BY RT-PCR (FLU A&B, COVID) ARPGX2
Influenza A by PCR: NEGATIVE
Influenza B by PCR: NEGATIVE
SARS Coronavirus 2 by RT PCR: NEGATIVE

## 2020-07-07 LAB — HEMOGLOBIN A1C
Hgb A1c MFr Bld: 5.6 % (ref 4.8–5.6)
Mean Plasma Glucose: 114.02 mg/dL

## 2020-07-07 LAB — APTT: aPTT: 32 seconds (ref 24–36)

## 2020-07-07 LAB — LACTIC ACID, PLASMA: Lactic Acid, Venous: 1 mmol/L (ref 0.5–1.9)

## 2020-07-07 LAB — VITAMIN B12: Vitamin B-12: 1862 pg/mL — ABNORMAL HIGH (ref 180–914)

## 2020-07-07 MED ORDER — INSULIN ASPART 100 UNIT/ML ~~LOC~~ SOLN: 0.0000 [IU] | Freq: Every day | SUBCUTANEOUS | Status: DC

## 2020-07-07 MED ORDER — UMECLIDINIUM BROMIDE 62.5 MCG/INH IN AEPB
1.0000 | INHALATION_SPRAY | Freq: Every day | RESPIRATORY_TRACT | Status: DC
  Filled 2020-07-07: qty 7

## 2020-07-07 MED ORDER — PREDNISONE 1 MG PO TABS
1.0000 mg | ORAL_TABLET | Freq: Two times a day (BID) | ORAL | Status: DC
  Administered 2020-07-08 – 2020-07-10 (×5): 1 mg via ORAL
  Filled 2020-07-07 (×5): qty 1

## 2020-07-07 MED ORDER — LIDOCAINE 5 % EX OINT: 1.0000 "application " | TOPICAL_OINTMENT | Freq: Every day | CUTANEOUS | Status: DC | PRN

## 2020-07-07 MED ORDER — INSULIN ASPART 100 UNIT/ML ~~LOC~~ SOLN
0.0000 [IU] | Freq: Three times a day (TID) | SUBCUTANEOUS | Status: DC
  Administered 2020-07-08: 2 [IU] via SUBCUTANEOUS
  Administered 2020-07-08: 1 [IU] via SUBCUTANEOUS
  Administered 2020-07-08: 2 [IU] via SUBCUTANEOUS
  Administered 2020-07-09 (×2): 1 [IU] via SUBCUTANEOUS
  Administered 2020-07-09: 5 [IU] via SUBCUTANEOUS
  Administered 2020-07-10: 1 [IU] via SUBCUTANEOUS

## 2020-07-07 MED ORDER — SODIUM CHLORIDE 0.9 % IV SOLN
1.0000 g | Freq: Once | INTRAVENOUS | Status: AC
  Administered 2020-07-07: 1 g via INTRAVENOUS
  Filled 2020-07-07: qty 1

## 2020-07-07 MED ORDER — ROSUVASTATIN CALCIUM 10 MG PO TABS
10.0000 mg | ORAL_TABLET | Freq: Every evening | ORAL | Status: DC
  Administered 2020-07-07 – 2020-07-09 (×3): 10 mg via ORAL
  Filled 2020-07-07 (×3): qty 1

## 2020-07-07 MED ORDER — HYDROCODONE-ACETAMINOPHEN 10-325 MG PO TABS
1.0000 | ORAL_TABLET | ORAL | Status: DC | PRN
  Administered 2020-07-07: 1 via ORAL
  Filled 2020-07-07 (×2): qty 1

## 2020-07-07 MED ORDER — MAGNESIUM OXIDE 400 (241.3 MG) MG PO TABS
400.0000 mg | ORAL_TABLET | Freq: Every day | ORAL | Status: DC
  Administered 2020-07-08 – 2020-07-10 (×3): 400 mg via ORAL
  Filled 2020-07-07 (×3): qty 1

## 2020-07-07 MED ORDER — UMECLIDINIUM BROMIDE 62.5 MCG/INH IN AEPB
1.0000 | INHALATION_SPRAY | Freq: Every day | RESPIRATORY_TRACT | Status: DC
  Administered 2020-07-08 – 2020-07-10 (×3): 1 via RESPIRATORY_TRACT

## 2020-07-07 MED ORDER — DULOXETINE HCL 30 MG PO CPEP
90.0000 mg | ORAL_CAPSULE | Freq: Every day | ORAL | Status: DC
  Administered 2020-07-07 – 2020-07-10 (×4): 90 mg via ORAL
  Filled 2020-07-07 (×4): qty 3

## 2020-07-07 MED ORDER — LACTATED RINGERS IV SOLN: INTRAVENOUS | Status: AC

## 2020-07-07 MED ORDER — SODIUM CHLORIDE 0.9 % IV BOLUS
500.0000 mL | Freq: Once | INTRAVENOUS | Status: AC
  Administered 2020-07-07: 500 mL via INTRAVENOUS

## 2020-07-07 MED ORDER — FLUTICASONE-UMECLIDIN-VILANT 100-62.5-25 MCG/INH IN AEPB: 1.0000 | INHALATION_SPRAY | Freq: Every day | RESPIRATORY_TRACT | Status: DC

## 2020-07-07 MED ORDER — FLUTICASONE FUROATE-VILANTEROL 100-25 MCG/INH IN AEPB
1.0000 | INHALATION_SPRAY | Freq: Every day | RESPIRATORY_TRACT | Status: DC
  Administered 2020-07-08 – 2020-07-10 (×3): 1 via RESPIRATORY_TRACT

## 2020-07-07 MED ORDER — ACETAMINOPHEN 650 MG RE SUPP: 650.0000 mg | Freq: Four times a day (QID) | RECTAL | Status: DC | PRN

## 2020-07-07 MED ORDER — SODIUM CHLORIDE 0.9 % IV SOLN
1.0000 g | Freq: Two times a day (BID) | INTRAVENOUS | Status: DC
  Administered 2020-07-07 – 2020-07-09 (×5): 1 g via INTRAVENOUS
  Filled 2020-07-07 (×6): qty 1

## 2020-07-07 MED ORDER — ENSURE ENLIVE PO LIQD
237.0000 mL | Freq: Two times a day (BID) | ORAL | Status: DC | PRN
  Administered 2020-07-07: 237 mL via ORAL

## 2020-07-07 MED ORDER — DULOXETINE HCL 30 MG PO CPEP: 30.0000 mg | ORAL_CAPSULE | Freq: Every day | ORAL | Status: DC

## 2020-07-07 MED ORDER — ONDANSETRON HCL 4 MG/2ML IJ SOLN: 4.0000 mg | Freq: Four times a day (QID) | INTRAMUSCULAR | Status: DC | PRN

## 2020-07-07 MED ORDER — ONDANSETRON HCL 4 MG PO TABS: 4.0000 mg | ORAL_TABLET | Freq: Four times a day (QID) | ORAL | Status: DC | PRN

## 2020-07-07 MED ORDER — ACETAMINOPHEN 325 MG PO TABS
650.0000 mg | ORAL_TABLET | Freq: Four times a day (QID) | ORAL | Status: DC | PRN
  Administered 2020-07-07 – 2020-07-10 (×7): 650 mg via ORAL
  Filled 2020-07-07 (×7): qty 2

## 2020-07-07 MED ORDER — FLUTICASONE FUROATE-VILANTEROL 100-25 MCG/INH IN AEPB
1.0000 | INHALATION_SPRAY | Freq: Every day | RESPIRATORY_TRACT | Status: DC
  Filled 2020-07-07: qty 28

## 2020-07-07 MED ORDER — ALBUTEROL SULFATE HFA 108 (90 BASE) MCG/ACT IN AERS: 2.0000 | INHALATION_SPRAY | Freq: Four times a day (QID) | RESPIRATORY_TRACT | Status: DC | PRN

## 2020-07-07 MED ORDER — MONTELUKAST SODIUM 10 MG PO TABS
10.0000 mg | ORAL_TABLET | Freq: Every day | ORAL | Status: DC
  Administered 2020-07-07 – 2020-07-09 (×3): 10 mg via ORAL
  Filled 2020-07-07 (×3): qty 1

## 2020-07-07 MED ORDER — APOAEQUORIN 10 MG PO CAPS: ORAL_CAPSULE | Freq: Every day | ORAL | Status: DC

## 2020-07-07 NOTE — H&P (Signed)
History and Physical    Kayla Thompson RCV:[id number] DOB: 05-09-1935 DOA: 07/07/2020  PCP: [name], MD  Patient coming from: Home  Chief Complaint: Alert mental status  HPI: Kayla Thompson is a 85 y.o. female with medical history significant of DM2, arthritis, hypothyroidism. Presenting with altered mental status: disorientation. Patient is a poor historian. Hx from family at bedside. Symptoms started about 4 days ago. Her daughters says "she just seemed confused." She couldn't keep up with conversation. Seemed to have difficulty completing ADLs. Her daughter took her to her urologist because she was concerned about a UTI. Non was found at the time. Her confusion worsened through this morning. So, her daughter brought her to the ED for help. She reports that her glucose was up after a steroid shot in her shoulders a few days ago, but she is not aware of any other aggravating or alleviating factors.   ED Course: She was found to be confused. WBCs were 17k. She was slightly acidic. UA was dirty. She was started on merrem for a history of ESBL e coli infection and UTI. THR was called for admission.    Review of Systems:  Unable to obtain d/t mentation.    PMHx Past Medical History:  Diagnosis Date  . Anxiety   . Arthritis   . Asthma   . Cataracts, bilateral    removed by surgery  . COPD (chronic obstructive pulmonary disease) ([place])   . Depression   . Diabetes mellitus without complication ([place])    type 2  . Dyspnea    with exertion  . Fibromyalgia   . GERD (gastroesophageal reflux disease)   . History of chemotherapy   . History of fractured pelvis   . Hypertension   . Hypothyroidism   . Osteoporosis 12/2017   T score -2.8 stable from prior DEXA  . Osteoporosis   . Ovarian cancer ([place])   . Pneumonia    several times    PSHx Past Surgical History:  Procedure Laterality Date  . ABDOMINAL HYSTERECTOMY  1996  . ANKLE FRACTURE SURGERY     plate and screws  .  BIOPSY  02/05/2019   Procedure: BIOPSY;  Surgeon: Rush Landmark Telford Nab., MD;  Location: [facility];  Service: Gastroenterology;;  . BLADDER SUSPENSION    . CATARACT EXTRACTION    . COLONOSCOPY    . ESOPHAGEAL BRUSHING  02/05/2019   Procedure: ESOPHAGEAL BRUSHING;  Surgeon: Rush Landmark Telford Nab., MD;  Location: [facility];  Service: Gastroenterology;;  . ESOPHAGOGASTRODUODENOSCOPY (EGD) WITH PROPOFOL N/A 02/05/2019   Procedure: ESOPHAGOGASTRODUODENOSCOPY (EGD) WITH PROPOFOL with dialtion;  Surgeon: Irving Copas., MD;  Location: [facility];  Service: Gastroenterology;  Laterality: N/A;  . EYE SURGERY     cataracts removed-bilateral  . hip surgey    . knee surgey    . LUNG SURGERY    . OOPHORECTOMY     BSO  . SAVORY DILATION N/A 02/05/2019   Procedure: SAVORY DILATION;  Surgeon: Rush Landmark Telford Nab., MD;  Location: [facility];  Service: Gastroenterology;  Laterality: N/A;  . TONSILLECTOMY    . UPPER GI ENDOSCOPY      SocHx  reports that she has never smoked. She has never used smokeless tobacco. She reports that she does not drink alcohol and does not use drugs.  Allergies  Allergen Reactions  . Latex Rash  . Penicillins Rash    Has patient had a PCN reaction causing immediate rash, facial/tongue/throat swelling, SOB or lightheadedness with hypotension: No  Has patient had a PCN reaction causing severe rash involving mucus membranes or skin necrosis: No Has patient had a PCN reaction that required hospitalization: No Has patient had a PCN reaction occurring within the last 10 years: No If all of the above answers are "NO", then may proceed with Cephalosporin use.     FamHx Family History  Problem Relation Age of Onset  . Lung cancer Mother   . CAD Father   . Diabetes Father   . Lung cancer Maternal Aunt   . Diabetes Paternal Aunt   . Diabetes Paternal Uncle   . Diabetes Paternal Grandmother   . Diabetes Paternal Grandfather   . Colon cancer Neg Hx    . Esophageal cancer Neg Hx   . Inflammatory bowel disease Neg Hx   . Liver disease Neg Hx   . Pancreatic cancer Neg Hx   . Rectal cancer Neg Hx   . Stomach cancer Neg Hx     Prior to Admission medications   Medication Sig Start Date End Date Taking? Authorizing Provider  albuterol (PROAIR HFA) 108 (90 Base) MCG/ACT inhaler Inhale 2 puffs into the lungs every 6 (six) hours as needed for wheezing or shortness of breath. 08/27/19   Baird Lyons D, MD  amitriptyline (ELAVIL) 10 MG tablet TAKE 1 TABLET BY MOUTH EVERYDAY AT BEDTIME 05/19/20   [name], MD  baclofen (LIORESAL) 10 MG tablet TAKE 0.5-1 TABLET BY MOUTH TWICE DAILY AS NEEDED FOR MUSCLE SPASMS OR PAIN 06/19/20   [name], MD  BD PEN NEEDLE NANO 2ND GEN 32G X 4 MM MISC USE AS DIRECTED 4 TIMES A DAY 10/30/19   [name], MD  busPIRone (BUSPAR) 5 MG tablet TAKE 1 TO 2 TABLETS BY MOUTH TWICE A DAY Patient taking differently: Take 5-10 mg by mouth as needed. 03/25/20   [name], MD  clotrimazole-betamethasone (LOTRISONE) cream APPLY TO AFFECTED AREA TWICE A DAY Patient taking differently: Apply 1 application topically 2 (two) times daily. 02/26/20   [name], MD  DULoxetine (CYMBALTA) 30 MG capsule Take 1 capsule (30 mg total) by mouth daily. 05/19/20   [name], MD  DULoxetine (CYMBALTA) 60 MG capsule Take 1 capsule (60 mg total) by mouth daily. 05/19/20   [name], MD  feeding supplement (ENSURE ENLIVE / ENSURE PLUS) LIQD Take 237 mLs by mouth 2 (two) times daily between meals. 01/16/20   Hongalgi, Everlene Farrier D, MD  glucose blood (FREESTYLE LITE) test strip USE TO TEST BLOOD SUGAR 3 TIMES DAILY. DX: E11.65 03/04/20   Philemon Kingdom, MD  HYDROcodone-acetaminophen (NORCO) 10-325 MG tablet Take 1 tablet by mouth every 4 (four) hours as needed for severe pain. 11/15/19   [provider]  insulin glargine (LANTUS SOLOSTAR) 100 UNIT/ML Solostar Pen Inject 8  Units into the skin daily. 06/12/20   Philemon Kingdom, MD  ipratropium-albuterol (DUONEB) 0.5-2.5 (3) MG/3ML SOLN Inhale 3 mLs into the lungs 3 (three) times daily as needed (shortness of breath). USE 1 VIAL IN NEBULIZER 3 TIMES DAILY 01/15/20   Hongalgi, Lenis Dickinson, MD  Lancets (FREESTYLE) lancets Use to test blood sugar 3 times daily. Dx: E11.65 10/14/16   Philemon Kingdom, MD  levothyroxine (SYNTHROID) 50 MCG tablet TAKE 1 TABLET BY MOUTH EVERY DAY BEFORE BREAKFAST 05/14/20   [name], MD  lidocaine (XYLOCAINE) 5 % ointment Apply 1 application topically as needed. Apply to external vulvar area 01/02/20   Joseph Pierini, MD  magnesium oxide (  MAG-OX) 400 MG tablet TAKE 1 TABLET BY MOUTH EVERY DAY Patient taking differently: Take 400 mg by mouth daily. 02/26/20   [name], MD  Misc Natural Products (TART CHERRY ADVANCED PO) Take by mouth. Once daily    [provider]  montelukast (SINGULAIR) 10 MG tablet TAKE 1 TABLET BY MOUTH EVERY DAY 06/19/20   Baird Lyons D, MD  omeprazole (PRILOSEC) 20 MG capsule TAKE 1 CAPSULE BY MOUTH 2 TIMES A DAY BEFORE A MEAL 05/21/20   Mansouraty, Telford Nab., MD  predniSONE (DELTASONE) 1 MG tablet Take 2 tablets (2 mg total) by mouth daily. 07/25/18   [name], MD  Pseudoephedrine-guaiFENesin [facility] D MAX STRENGTH PO) Take 2,400 mg by mouth daily.    [provider]  rosuvastatin (CRESTOR) 10 MG tablet Take 1 tablet (10 mg total) by mouth every evening. 05/19/20   [name], MD  TRELEGY ELLIPTA 100-62.5-25 MCG/INH AEPB INHALE 1 PUFF INTO THE LUNGS DAILY. RINSE MOUTH 07/05/20   Baird Lyons D, MD  UNABLE TO FIND Take 1,000 mg by mouth daily. Med Name: Tumeric    [provider]    Physical Exam: Vitals:   07/07/20 1045 07/07/20 1130 07/07/20 1230 07/07/20 1315  BP:  130/71 137/77 136/79  Pulse:  99 93 98  Resp:  18 (!) 27 (!) 23  Temp:      TempSrc:      SpO2:  99% 97% 100%  Weight: 63.5  kg     Height: 4\' 11"  (1.499 m)       General: 85 y.o. female resting in bed in NAD Eyes: PERRL, normal sclera ENMT: Nares patent w/o discharge, orophaynx clear, dentition normal, ears w/o discharge/lesions/ulcers Neck: Supple, trachea midline Cardiovascular: RRR, +S1, S2, no m/g/r, equal pulses throughout Respiratory: CTABL, no w/r/r, normal WOB GI: BS+, NDNT, no masses noted, no organomegaly noted MSK: No e/c/c Neuro: A&O x to name only, no focal deficits Psyc: pleasantly confused, calm/cooperative  Labs on Admission: I have personally reviewed following labs and imaging studies  CBC: Recent Labs  Lab 07/07/20 1113  WBC 17.7*  NEUTROABS 13.6*  HGB 10.9*  HCT 33.3*  MCV 116.8*  PLT 376   Basic Metabolic Panel: Recent Labs  Lab 07/07/20 1113  NA 136  K 4.1  CL 106  CO2 19*  GLUCOSE 104*  BUN 21  CREATININE 1.05*  CALCIUM 8.9   GFR: Estimated Creatinine Clearance: 32.9 mL/min (A) (by C-G formula based on SCr of 1.05 mg/dL (H)). Liver Function Tests: Recent Labs  Lab 07/07/20 1113  AST 14*  ALT 15  ALKPHOS 31*  BILITOT 0.7  PROT 6.5  ALBUMIN 3.3*   No results for input(s): LIPASE, AMYLASE in the last 168 hours. No results for input(s): AMMONIA in the last 168 hours. Coagulation Profile: Recent Labs  Lab 07/07/20 1225  INR 1.0   Cardiac Enzymes: No results for input(s): CKTOTAL, CKMB, CKMBINDEX, TROPONINI in the last 168 hours. BNP (last 3 results) Recent Labs    11/20/19 1549 01/25/20 1147  PROBNP 58.0 103.0*   HbA1C: No results for input(s): HGBA1C in the last 72 hours. CBG: No results for input(s): GLUCAP in the last 168 hours. Lipid Profile: No results for input(s): CHOL, HDL, LDLCALC, TRIG, CHOLHDL, LDLDIRECT in the last 72 hours. Thyroid Function Tests: No results for input(s): TSH, T4TOTAL, FREET4, T3FREE, THYROIDAB in the last 72 hours. Anemia Panel: No results for input(s): VITAMINB12, FOLATE, FERRITIN, TIBC, IRON, RETICCTPCT in  the  last 72 hours. Urine analysis:    Component Value Date/Time   COLORURINE YELLOW 07/07/2020 1100   APPEARANCEUR CLOUDY (A) 07/07/2020 1100   LABSPEC 1.028 07/07/2020 1100   PHURINE 5.0 07/07/2020 1100   GLUCOSEU NEGATIVE 07/07/2020 1100   GLUCOSEU NEGATIVE 05/26/2020 1436   HGBUR NEGATIVE 07/07/2020 1100   BILIRUBINUR NEGATIVE 07/07/2020 1100   BILIRUBINUR 1+ 06/13/2019 1531   KETONESUR 5 (A) 07/07/2020 1100   PROTEINUR 100 (A) 07/07/2020 1100   UROBILINOGEN 0.2 05/26/2020 1436   NITRITE NEGATIVE 07/07/2020 1100   LEUKOCYTESUR LARGE (A) 07/07/2020 1100    Radiological Exams on Admission: DG Chest Port 1 View  Result Date: 07/07/2020 CLINICAL DATA:  Sepsis. EXAM: PORTABLE CHEST 1 VIEW COMPARISON:  May 07, 2020. FINDINGS: The heart size and mediastinal contours are within normal limits. Both lungs are clear. The visualized skeletal structures are unremarkable. IMPRESSION: No active disease. Electronically Signed   By: Marijo Conception M.D.   On: 07/07/2020 12:28    EKG: Independently reviewed. Sinus, no st elevations  Assessment/Plan Acute metabolic encephalopathy UTI?     - admit to inpt, tele     - continue current abx until UCx data received     - fluids  Non-gap metabolic acidosis     - likely secondary to above     - fluids, check lactic acid  Macrocytic anemia     - check B12, THF     - no evidence of bleed  Hypothyroidism     - continue synthroid  DM2     - SSI, A1c, glucose check, DM diet  DVT prophylaxis: SCDs  Code Status: DNR  Family Communication: Spoke with daughter by phone [name] [phone])  Consults called: None   Status is: Inpatient  Remains inpatient appropriate because:Inpatient level of care appropriate due to severity of illness   Dispo: The patient is from: Home              Anticipated d/c is to: Home              Patient currently is not medically stable to d/c.   Difficult to place patient No  Jonnie Finner  DO [facility]  If 7PM-7AM, please contact night-coverage [email or web address]  07/07/2020, 1:57 PM

## 2020-07-07 NOTE — ED Notes (Signed)
Provider at bedside

## 2020-07-07 NOTE — Plan of Care (Signed)
  Problem: Clinical Measurements: Goal: Will remain free from infection Outcome: Progressing Goal: Diagnostic test results will improve Outcome: Progressing Goal: Respiratory complications will improve Outcome: Progressing Goal: Cardiovascular complication will be avoided Outcome: Progressing   Problem: Activity: Goal: Risk for activity intolerance will decrease Outcome: Progressing   Problem: Elimination: Goal: Will not experience complications related to bowel motility Outcome: Progressing Goal: Will not experience complications related to urinary retention Outcome: Progressing   Problem: Pain Managment: Goal: General experience of comfort will improve Outcome: Progressing   Problem: Safety: Goal: Ability to remain free from injury will improve Outcome: Progressing

## 2020-07-07 NOTE — Sepsis Progress Note (Signed)
The sepsis protocol is being followed by [facility].

## 2020-07-07 NOTE — Progress Notes (Signed)
A consult was received from an ED physician for Meropenem per pharmacy dosing.  The patient's profile has been reviewed for ht/wt/allergies/indication/available labs. Hx of ESBL organism, E coli in 3/4 BCx 01/12/2020    A one time order has been placed for Meropenem 1gm.  Further antibiotics/pharmacy consults should be ordered by admitting physician if indicated.                       Thank you,  Minda Ditto PharmD 07/07/2020  12:36 PM

## 2020-07-07 NOTE — ED Notes (Signed)
Family at bedside, provider made aware

## 2020-07-07 NOTE — ED Provider Notes (Signed)
[name] Provider Note   CSN: [id number] Arrival date & time: 07/07/20  1028     History Chief Complaint  Patient presents with  . Altered Mental Status    Kayla Thompson is a 85 y.o. female.  HPI     85 year old comes in a chief complaint of altered mental status.  Patient has history of diabetes, ESBL UTI.  Patient has had a UTI related altered mental status in the past.  Level 5 caveat for altered mental status.  I spoke with both patient's daughter and granddaughter.  They reported that about 2 or 3 days ago patient started having altered mental status.  Urologist had seen the patient and the urine analysis did not show any evidence of bacteria.  Patient did have some yeast, therefore she was given medicine for it.  However, over the last 2 days her symptoms have worsened.  At baseline, patient is able to hold a conversation and is very sharp.  There has been no falls or trauma.  Patient has no complaints from her side.  There is no fevers, chills, nausea, vomiting that family is aware of.  They are concerned however that she is become septic with UTI in the past with altered mental status as the presenting symptoms.  Confounding the matter is that patient had steroid shot to her shoulder recently and also she is taking pain medications.  Family is concerned that that could be contributing to her elevated sugar and confusion.  I spoke with patient's daughter over the phone.  She reports that she would like to see the urine test and make sure it is showing signs of infection before starting antibiotics, as her mother has resistance to multiple antibiotics and they would not want to run out of antibiotic options for future infection.  Past Medical History:  Diagnosis Date  . Anxiety   . Arthritis   . Asthma   . Cataracts, bilateral    removed by surgery  . COPD (chronic obstructive pulmonary disease) ([place])   . Depression   . Diabetes  mellitus without complication ([place])    type 2  . Dyspnea    with exertion  . Fibromyalgia   . GERD (gastroesophageal reflux disease)   . History of chemotherapy   . History of fractured pelvis   . Hypertension   . Hypothyroidism   . Osteoporosis 12/2017   T score -2.8 stable from prior DEXA  . Osteoporosis   . Ovarian cancer ([place])   . Pneumonia    several times    Patient Active Problem List   Diagnosis Date Noted  . AMS (altered mental status) 07/07/2020  . Hypertension associated with diabetes ([place]) 05/07/2020  . Hyperlipidemia associated with type 2 diabetes mellitus ([place]) 05/07/2020  . Depression with anxiety 05/07/2020  . PAD (peripheral artery disease) ([place]) 02/05/2020  . Lower extremity edema 11/20/2019  . UTI symptoms 05/29/2019  . Dysuria 05/11/2019  . Upper esophageal web 03/27/2019  . Iron deficiency anemia 03/27/2019  . Candida esophagitis ([place]) 02/14/2019  . Encounter for general adult medical examination with abnormal findings 01/09/2019  . Abnormal barium swallow 12/28/2018  . History of colonic polyps 12/28/2018  . Dysphagia 12/28/2018  . Diabetic foot ulcer ([place]) 12/05/2018  . Rotator cuff arthropathy, left 11/16/2018  . Degenerative arthritis of left knee 11/16/2018  . Insomnia 09/26/2018  . Leukocytosis 10/17/2017  . Severe protein-calorie malnutrition ([place]) 10/10/2017  . Memory changes 07/29/2017  . Granulomatous lung disease (  [place]) 06/14/2017  . Tracheobronchomalacia 06/14/2017  . Dizziness 03/04/2017  . Exercise hypoxemia 07/12/2016  . Diastolic CHF, chronic ([place]) 12/25/2015  . GERD (gastroesophageal reflux disease) 12/25/2015  . Epidermal inclusion cyst 09/02/2015  . Muscle cramps 03/06/2015  . Cough 01/07/2015  . Bronchiectasis without acute exacerbation ([place]) 01/07/2015  . PMR (polymyalgia rheumatica) ([place]) 01/07/2015  . Osteoarthritis 01/07/2015  . Asthma, chronic 11/12/2014  . Anxiety state 11/12/2014  . Hyperparathyroidism ([place])  10/28/2014  . Fibromyalgia 09/21/2014  . Personal history of ovarian cancer 08/23/2014  . Osteoporosis 08/23/2014  . Diabetes mellitus with coincident hypertension ([place]) 08/02/2014  . HLD (hyperlipidemia) 08/02/2014  . Hypothyroidism 08/02/2014    Past Surgical History:  Procedure Laterality Date  . ABDOMINAL HYSTERECTOMY  1996  . ANKLE FRACTURE SURGERY     plate and screws  . BIOPSY  02/05/2019   Procedure: BIOPSY;  Surgeon: Rush Landmark Telford Nab., MD;  Location: [facility];  Service: Gastroenterology;;  . BLADDER SUSPENSION    . CATARACT EXTRACTION    . COLONOSCOPY    . ESOPHAGEAL BRUSHING  02/05/2019   Procedure: ESOPHAGEAL BRUSHING;  Surgeon: Rush Landmark Telford Nab., MD;  Location: [facility];  Service: Gastroenterology;;  . ESOPHAGOGASTRODUODENOSCOPY (EGD) WITH PROPOFOL N/A 02/05/2019   Procedure: ESOPHAGOGASTRODUODENOSCOPY (EGD) WITH PROPOFOL with dialtion;  Surgeon: Irving Copas., MD;  Location: [facility];  Service: Gastroenterology;  Laterality: N/A;  . EYE SURGERY     cataracts removed-bilateral  . hip surgey    . knee surgey    . LUNG SURGERY    . OOPHORECTOMY     BSO  . SAVORY DILATION N/A 02/05/2019   Procedure: SAVORY DILATION;  Surgeon: Rush Landmark Telford Nab., MD;  Location: [facility];  Service: Gastroenterology;  Laterality: N/A;  . TONSILLECTOMY    . UPPER GI ENDOSCOPY       OB History    Gravida  1   Para  1   Term      Preterm      AB      Living  1     SAB      IAB      Ectopic      Multiple      Live Births              Family History  Problem Relation Age of Onset  . Lung cancer Mother   . CAD Father   . Diabetes Father   . Lung cancer Maternal Aunt   . Diabetes Paternal Aunt   . Diabetes Paternal Uncle   . Diabetes Paternal Grandmother   . Diabetes Paternal Grandfather   . Colon cancer Neg Hx   . Esophageal cancer Neg Hx   . Inflammatory bowel disease Neg Hx   . Liver disease Neg Hx   .  Pancreatic cancer Neg Hx   . Rectal cancer Neg Hx   . Stomach cancer Neg Hx     Social History   Tobacco Use  . Smoking status: Never Smoker  . Smokeless tobacco: Never Used  Vaping Use  . Vaping Use: Never used  Substance Use Topics  . Alcohol use: No    Alcohol/week: 0.0 standard drinks  . Drug use: No    Home Medications Prior to Admission medications   Medication Sig Start Date End Date Taking? Authorizing Provider  albuterol (PROAIR HFA) 108 (90 Base) MCG/ACT inhaler Inhale 2 puffs into the lungs every 6 (six) hours as needed for wheezing or shortness of breath. 08/27/19  Baird Lyons D, MD  amitriptyline (ELAVIL) 10 MG tablet TAKE 1 TABLET BY MOUTH EVERYDAY AT BEDTIME 05/19/20   [name], MD  baclofen (LIORESAL) 10 MG tablet TAKE 0.5-1 TABLET BY MOUTH TWICE DAILY AS NEEDED FOR MUSCLE SPASMS OR PAIN 06/19/20   [name], MD  BD PEN NEEDLE NANO 2ND GEN 32G X 4 MM MISC USE AS DIRECTED 4 TIMES A DAY 10/30/19   [name], MD  busPIRone (BUSPAR) 5 MG tablet TAKE 1 TO 2 TABLETS BY MOUTH TWICE A DAY Patient taking differently: Take 5-10 mg by mouth as needed. 03/25/20   [name], MD  clotrimazole-betamethasone (LOTRISONE) cream APPLY TO AFFECTED AREA TWICE A DAY Patient taking differently: Apply 1 application topically 2 (two) times daily. 02/26/20   [name], MD  DULoxetine (CYMBALTA) 30 MG capsule Take 1 capsule (30 mg total) by mouth daily. 05/19/20   [name], MD  DULoxetine (CYMBALTA) 60 MG capsule Take 1 capsule (60 mg total) by mouth daily. 05/19/20   [name], MD  feeding supplement (ENSURE ENLIVE / ENSURE PLUS) LIQD Take 237 mLs by mouth 2 (two) times daily between meals. 01/16/20   Hongalgi, Everlene Farrier D, MD  glucose blood (FREESTYLE LITE) test strip USE TO TEST BLOOD SUGAR 3 TIMES DAILY. DX: E11.65 03/04/20   Philemon Kingdom, MD  HYDROcodone-acetaminophen (NORCO) 10-325 MG tablet Take 1 tablet by  mouth every 4 (four) hours as needed for severe pain. 11/15/19   [provider]  insulin glargine (LANTUS SOLOSTAR) 100 UNIT/ML Solostar Pen Inject 8 Units into the skin daily. 06/12/20   Philemon Kingdom, MD  ipratropium-albuterol (DUONEB) 0.5-2.5 (3) MG/3ML SOLN Inhale 3 mLs into the lungs 3 (three) times daily as needed (shortness of breath). USE 1 VIAL IN NEBULIZER 3 TIMES DAILY 01/15/20   Hongalgi, Lenis Dickinson, MD  Lancets (FREESTYLE) lancets Use to test blood sugar 3 times daily. Dx: E11.65 10/14/16   Philemon Kingdom, MD  levothyroxine (SYNTHROID) 50 MCG tablet TAKE 1 TABLET BY MOUTH EVERY DAY BEFORE BREAKFAST 05/14/20   [name], MD  lidocaine (XYLOCAINE) 5 % ointment Apply 1 application topically as needed. Apply to external vulvar area 01/02/20   Joseph Pierini, MD  magnesium oxide (MAG-OX) 400 MG tablet TAKE 1 TABLET BY MOUTH EVERY DAY Patient taking differently: Take 400 mg by mouth daily. 02/26/20   [name], MD  Misc Natural Products (TART CHERRY ADVANCED PO) Take by mouth. Once daily    [provider]  montelukast (SINGULAIR) 10 MG tablet TAKE 1 TABLET BY MOUTH EVERY DAY 06/19/20   Baird Lyons D, MD  omeprazole (PRILOSEC) 20 MG capsule TAKE 1 CAPSULE BY MOUTH 2 TIMES A DAY BEFORE A MEAL 05/21/20   Mansouraty, Telford Nab., MD  predniSONE (DELTASONE) 1 MG tablet Take 2 tablets (2 mg total) by mouth daily. 07/25/18   [name], MD  Pseudoephedrine-guaiFENesin [facility] D MAX STRENGTH PO) Take 2,400 mg by mouth daily.    [provider]  rosuvastatin (CRESTOR) 10 MG tablet Take 1 tablet (10 mg total) by mouth every evening. 05/19/20   [name], MD  TRELEGY ELLIPTA 100-62.5-25 MCG/INH AEPB INHALE 1 PUFF INTO THE LUNGS DAILY. RINSE MOUTH 07/05/20   Baird Lyons D, MD  UNABLE TO FIND Take 1,000 mg by mouth daily. Med Name: Tumeric    [provider]    Allergies    Latex and Penicillins  Review of Systems  Review of Systems  Unable to perform ROS: Mental status change    Physical Exam Updated Vital Signs BP 136/79   Pulse 98   Temp 97.9 F (36.6 C) (Oral)   Resp (!) 23   Ht 4\' 11"  (1.499 m)   Wt 63.5 kg   LMP  (LMP Unknown)   SpO2 100%   BMI 28.28 kg/m   Physical Exam Vitals and nursing note reviewed.  Constitutional:      Appearance: She is well-developed.  HENT:     Head: Normocephalic and atraumatic.  Cardiovascular:     Rate and Rhythm: Normal rate.  Pulmonary:     Effort: Pulmonary effort is normal.  Abdominal:     General: Bowel sounds are normal.  Musculoskeletal:     Cervical back: Normal range of motion and neck supple.  Skin:    General: Skin is warm and dry.  Neurological:     Mental Status: She is alert. She is disoriented.     Comments: Oriented to self only, able to tell me date of birth.  Able to recognize objects such as TV monitor and keep     ED Results / Procedures / Treatments   Labs (all labs ordered are listed, but only abnormal results are displayed) Labs Reviewed  CBC WITH DIFFERENTIAL/PLATELET - Abnormal; Notable for the following components:      Result Value   WBC 17.7 (*)    RBC 2.85 (*)    Hemoglobin 10.9 (*)    HCT 33.3 (*)    MCV 116.8 (*)    MCH 38.2 (*)    Neutro Abs 13.6 (*)    Abs Immature Granulocytes 0.19 (*)    All other components within normal limits  COMPREHENSIVE METABOLIC PANEL - Abnormal; Notable for the following components:   CO2 19 (*)    Glucose, Bld 104 (*)    Creatinine, Ser 1.05 (*)    Albumin 3.3 (*)    AST 14 (*)    Alkaline Phosphatase 31 (*)    GFR, Estimated 53 (*)    All other components within normal limits  URINALYSIS, ROUTINE W REFLEX MICROSCOPIC - Abnormal; Notable for the following components:   APPearance CLOUDY (*)    Ketones, ur 5 (*)    Protein, ur 100 (*)    Leukocytes,Ua LARGE (*)    WBC, UA >50 (*)    Bacteria, UA MANY (*)    All other components within normal limits  RESP  PANEL BY RT-PCR (FLU A&B, COVID) [id number]  URINE CULTURE  CULTURE, BLOOD (ROUTINE X 2)  CULTURE, BLOOD (ROUTINE X 2)  PROTIME-INR  APTT  LACTIC ACID, PLASMA  LACTIC ACID, PLASMA    EKG EKG Interpretation  Date/Time:  Monday July 07 2020 12:26:43 EDT Ventricular Rate:  99 PR Interval:  120 QRS Duration: 83 QT Interval:  354 QTC Calculation: 455 R Axis:   51 Text Interpretation: Sinus rhythm No acute changes No old tracing to compare Confirmed by Varney Biles ([id number]) on 07/07/2020 1:55:09 PM   Radiology DG Chest Port 1 View  Result Date: 07/07/2020 CLINICAL DATA:  Sepsis. EXAM: PORTABLE CHEST 1 VIEW COMPARISON:  May 07, 2020. FINDINGS: The heart size and mediastinal contours are within normal limits. Both lungs are clear. The visualized skeletal structures are unremarkable. IMPRESSION: No active disease. Electronically Signed   By: Marijo Conception M.D.   On: 07/07/2020 12:28    Procedures .Critical Care Performed by: Varney Biles, MD Authorized by: Kathrynn Humble,  Arjen Deringer, MD   Critical care provider statement:    Critical care time (minutes):  56   Critical care was necessary to treat or prevent imminent or life-threatening deterioration of the following conditions:  Circulatory failure   Critical care was time spent personally by me on the following activities:  Discussions with consultants, evaluation of patient's response to treatment, examination of patient, ordering and performing treatments and interventions, ordering and review of laboratory studies, ordering and review of radiographic studies, pulse oximetry, re-evaluation of patient's condition, obtaining history from patient or surrogate and review of old charts     Medications Ordered in ED Medications  lactated ringers infusion (has no administration in time range)  sodium chloride 0.9 % bolus 500 mL (0 mLs Intravenous Stopped 07/07/20 1308)  meropenem (MERREM) 1 g in sodium chloride 0.9 % 100 mL IVPB (1 g  Intravenous New Bag/Given 07/07/20 1312)    ED Course  I have reviewed the triage vital signs and the nursing notes.  Pertinent labs & imaging results that were available during my care of the patient were reviewed by me and considered in my medical decision making (see chart for details).    MDM Rules/Calculators/A&P                          85 year old comes in w/ chief complaint of altered mental status.  No trauma.  Prior history of similar presentation with UTI.  Patient is disoriented but answering the questions appropriately to the best of her ability.  No evidence of aphasia, dysarthria and there is no focal neurodeficit.  Discussed case with patient's family members.  Given the confounding situation of steroid shot and some pain meds, they are wanting to make sure that patient's UA is showing signs of infection.  They informed me that the urologist just looked at the urine end of last week and told the family that there was no signs of infection.  They were surprised by the UA results, and there is suspicion for underlying UTI is higher now as patient has declined further over the last 2 days.  Have started with basic blood work-up right now. Questionable sepsis, although patient only has 1 sirs criteria at arrival which is heart rate over 90.  Reassessment: Patient's white count is elevated.  Again this could be because of the steroid shot she received or could be because of infection. UA returned and it is showing pyuria and bacteriuria.  Given the high pretest probability for UTI, at this time we will proceed with code sepsis.  I have requested meropenem to be started by pharmacy. No clear evidence of endorgan dysfunction besides altered mental status.  REassessment: Results and the plan discussed with patient's granddaughter at the bedside  Final Clinical Impression(s) / ED Diagnoses Final diagnoses:  Disorientation  Lower urinary tract infectious disease    Rx / DC  Orders ED Discharge Orders    None       Varney Biles, MD 07/07/20 1358

## 2020-07-07 NOTE — Progress Notes (Signed)
PHARMACY NOTE -  meropenem  Pharmacy has been assisting with dosing of meropenem for UTI w/ Hx ESBL E coli.  Dosage remains stable at 1g IV q 12 hr and further renal adjustments per institutional Pharmacy antibiotic protocol  Pharmacy will sign off, following peripherally for culture results or dose adjustments. Please reconsult if a change in clinical status warrants re-evaluation of dosage.  Reuel Boom, PharmD, BCPS [phone] 07/07/2020, 3:11 PM

## 2020-07-07 NOTE — Telephone Encounter (Signed)
[facility]   Caller states tonight patient's blood sugar 356. She did have a cortisone shot in arm today. Only does long acting insulin once a day in the morning. No symptoms. Caller states patient takes 15 units of Lantus every morning.   Advised to call PCP within 24 hours.   Care advice states to not page between 9pm-9am to wait until morning. Will advise caller to call back in the morning if high blood sugars persist and then we will page the doctor. Caller verbalized understanding. Will call back if high blood sugar persists. Also advised pt should be drinking plenty of water and follow low sugar/carb diet until cortisone shot has time to wear off

## 2020-07-07 NOTE — ED Triage Notes (Signed)
Pt comes from home, sent for AMS since yesterday. EMS unable to obtain pt health hx other than hx of "memory issues" and receiving cortisone shots last week for chronic shoulder pain. Pt alert to self, but unable to answer other questions correctly. Pt lives alone w 24/7 care from CNA

## 2020-07-08 DIAGNOSIS — G9341 Metabolic encephalopathy: Secondary | ICD-10-CM

## 2020-07-08 DIAGNOSIS — F418 Other specified anxiety disorders: Secondary | ICD-10-CM

## 2020-07-08 DIAGNOSIS — E039 Hypothyroidism, unspecified: Secondary | ICD-10-CM

## 2020-07-08 DIAGNOSIS — E1169 Type 2 diabetes mellitus with other specified complication: Secondary | ICD-10-CM

## 2020-07-08 DIAGNOSIS — E1159 Type 2 diabetes mellitus with other circulatory complications: Secondary | ICD-10-CM

## 2020-07-08 DIAGNOSIS — N390 Urinary tract infection, site not specified: Principal | ICD-10-CM

## 2020-07-08 DIAGNOSIS — E785 Hyperlipidemia, unspecified: Secondary | ICD-10-CM

## 2020-07-08 DIAGNOSIS — K219 Gastro-esophageal reflux disease without esophagitis: Secondary | ICD-10-CM

## 2020-07-08 DIAGNOSIS — I152 Hypertension secondary to endocrine disorders: Secondary | ICD-10-CM

## 2020-07-08 LAB — CBC
HCT: 32.5 % — ABNORMAL LOW (ref 36.0–46.0)
Hemoglobin: 10.5 g/dL — ABNORMAL LOW (ref 12.0–15.0)
MCH: 38 pg — ABNORMAL HIGH (ref 26.0–34.0)
MCHC: 32.3 g/dL (ref 30.0–36.0)
MCV: 117.8 fL — ABNORMAL HIGH (ref 80.0–100.0)
Platelets: 254 10*3/uL (ref 150–400)
RBC: 2.76 MIL/uL — ABNORMAL LOW (ref 3.87–5.11)
RDW: 14.4 % (ref 11.5–15.5)
WBC: 16 10*3/uL — ABNORMAL HIGH (ref 4.0–10.5)
nRBC: 0 % (ref 0.0–0.2)

## 2020-07-08 LAB — GLUCOSE, CAPILLARY
Glucose-Capillary: 140 mg/dL — ABNORMAL HIGH (ref 70–99)
Glucose-Capillary: 139 mg/dL — ABNORMAL HIGH (ref 70–99)
Glucose-Capillary: 173 mg/dL — ABNORMAL HIGH (ref 70–99)
Glucose-Capillary: 136 mg/dL — ABNORMAL HIGH (ref 70–99)

## 2020-07-08 LAB — COMPREHENSIVE METABOLIC PANEL
ALT: 16 U/L (ref 0–44)
AST: 21 U/L (ref 15–41)
Albumin: 3 g/dL — ABNORMAL LOW (ref 3.5–5.0)
Alkaline Phosphatase: 34 U/L — ABNORMAL LOW (ref 38–126)
Anion gap: 12 (ref 5–15)
BUN: 14 mg/dL (ref 8–23)
CO2: 19 mmol/L — ABNORMAL LOW (ref 22–32)
Calcium: 8.6 mg/dL — ABNORMAL LOW (ref 8.9–10.3)
Chloride: 103 mmol/L (ref 98–111)
Creatinine, Ser: 0.73 mg/dL (ref 0.44–1.00)
GFR, Estimated: >60 mL/min (ref >60)
Glucose, Bld: 129 mg/dL — ABNORMAL HIGH (ref 70–99)
Potassium: 4.4 mmol/L (ref 3.5–5.1)
Sodium: 134 mmol/L — ABNORMAL LOW (ref 135–145)
Total Bilirubin: 0.9 mg/dL (ref 0.3–1.2)
Total Protein: 5.8 g/dL — ABNORMAL LOW (ref 6.5–8.1)

## 2020-07-08 MED ORDER — BUSPIRONE HCL 5 MG PO TABS: 5.0000 mg | ORAL_TABLET | Freq: Every day | ORAL | Status: DC | PRN

## 2020-07-08 MED ORDER — AMITRIPTYLINE HCL 10 MG PO TABS
10.0000 mg | ORAL_TABLET | Freq: Every day | ORAL | Status: DC
  Administered 2020-07-08 – 2020-07-09 (×2): 10 mg via ORAL
  Filled 2020-07-08 (×2): qty 1

## 2020-07-08 MED ORDER — ASCORBIC ACID 500 MG PO TABS
1000.0000 mg | ORAL_TABLET | Freq: Two times a day (BID) | ORAL | Status: DC
  Administered 2020-07-08 – 2020-07-10 (×5): 1000 mg via ORAL
  Filled 2020-07-08 (×5): qty 2

## 2020-07-08 MED ORDER — IPRATROPIUM-ALBUTEROL 0.5-2.5 (3) MG/3ML IN SOLN: 3.0000 mL | Freq: Three times a day (TID) | RESPIRATORY_TRACT | Status: DC

## 2020-07-08 MED ORDER — POLYETHYLENE GLYCOL 3350 17 G PO PACK
17.0000 g | PACK | Freq: Every day | ORAL | Status: DC
  Administered 2020-07-08 – 2020-07-10 (×3): 17 g via ORAL
  Filled 2020-07-08 (×3): qty 1

## 2020-07-08 MED ORDER — PANTOPRAZOLE SODIUM 40 MG PO TBEC
40.0000 mg | DELAYED_RELEASE_TABLET | Freq: Two times a day (BID) | ORAL | Status: DC
  Administered 2020-07-08 – 2020-07-10 (×4): 40 mg via ORAL
  Filled 2020-07-08 (×4): qty 1

## 2020-07-08 MED ORDER — GUAIFENESIN 100 MG/5ML PO SOLN
400.0000 mg | ORAL | Status: DC
  Administered 2020-07-08 – 2020-07-10 (×10): 400 mg via ORAL
  Filled 2020-07-08: qty 10
  Filled 2020-07-08: qty 40
  Filled 2020-07-08: qty 20
  Filled 2020-07-08: qty 10
  Filled 2020-07-08 (×5): qty 20
  Filled 2020-07-08: qty 10

## 2020-07-08 MED ORDER — CALCIUM CARBONATE 1250 (500 CA) MG PO TABS
500.0000 mg | ORAL_TABLET | Freq: Two times a day (BID) | ORAL | Status: DC
  Administered 2020-07-08 – 2020-07-10 (×4): 500 mg via ORAL
  Filled 2020-07-08 (×4): qty 1

## 2020-07-08 MED ORDER — BACLOFEN 10 MG PO TABS
5.0000 mg | ORAL_TABLET | Freq: Two times a day (BID) | ORAL | Status: DC | PRN
  Administered 2020-07-08 – 2020-07-09 (×3): 10 mg via ORAL
  Filled 2020-07-08 (×3): qty 1

## 2020-07-08 MED ORDER — PANTOPRAZOLE SODIUM 40 MG PO TBEC
40.0000 mg | DELAYED_RELEASE_TABLET | Freq: Every day | ORAL | Status: DC
  Filled 2020-07-08: qty 1

## 2020-07-08 MED ORDER — PANTOPRAZOLE SODIUM 40 MG PO TBEC: 40.0000 mg | DELAYED_RELEASE_TABLET | Freq: Every day | ORAL | Status: DC

## 2020-07-08 MED ORDER — LEVOTHYROXINE SODIUM 50 MCG PO TABS
50.0000 ug | ORAL_TABLET | Freq: Every day | ORAL | Status: DC
  Administered 2020-07-09 – 2020-07-10 (×2): 50 ug via ORAL
  Filled 2020-07-08 (×2): qty 1

## 2020-07-08 MED ORDER — IPRATROPIUM-ALBUTEROL 0.5-2.5 (3) MG/3ML IN SOLN
3.0000 mL | Freq: Three times a day (TID) | RESPIRATORY_TRACT | Status: DC
  Administered 2020-07-08 – 2020-07-09 (×4): 3 mL via RESPIRATORY_TRACT
  Filled 2020-07-08 (×5): qty 3

## 2020-07-08 MED ORDER — GUAIFENESIN ER 600 MG PO TB12: 1200.0000 mg | ORAL_TABLET | Freq: Two times a day (BID) | ORAL | Status: DC

## 2020-07-08 MED ORDER — VITAMIN D 25 MCG (1000 UNIT) PO TABS
1000.0000 [IU] | ORAL_TABLET | Freq: Every day | ORAL | Status: DC
  Administered 2020-07-08 – 2020-07-10 (×3): 1000 [IU] via ORAL
  Filled 2020-07-08 (×3): qty 1

## 2020-07-08 MED ORDER — HYDROCODONE-ACETAMINOPHEN 10-325 MG PO TABS
0.5000 | ORAL_TABLET | ORAL | Status: DC | PRN
  Administered 2020-07-08 – 2020-07-10 (×8): 0.5 via ORAL
  Filled 2020-07-08 (×7): qty 1

## 2020-07-08 NOTE — Progress Notes (Signed)
PROGRESS NOTE    Kayla Thompson  WNI:[id number] DOB: [birth year] DOA: 07/07/2020 PCP: [name], MD    Chief Complaint  Patient presents with  . Altered Mental Status    Brief Narrative:  Patient is a 85 year old pleasant female with history of type 2 diabetes, arthritis, hypothyroidism who presented to the ED with disorientation, altered mental status with symptoms starting 4 days prior to admission.  Family was concerned of a UTI.  On presentation to the ED patient noted to have a leukocytosis with a white count of 17,000, urinalysis consistent with a UTI.  Patient noted with history of ESBL E. coli and as such placed empirically on IV Merrem pending culture results.   Assessment & Plan:   Principal Problem:   Acute metabolic encephalopathy Active Problems:   Acute lower UTI   Hypothyroidism   GERD (gastroesophageal reflux disease)   Severe protein-calorie malnutrition (HCC)   Hypertension associated with diabetes ([place])   Hyperlipidemia associated with type 2 diabetes mellitus ([place])   Depression with anxiety   AMS (altered mental status)   1 acute metabolic encephalopathy -Likely secondary to urinary tract infection. -Per family patient with history of altered mental status secondary to UTI. -Slight improvement with mentation today. -Urine cultures pending. -Patient with prior history of ESBL E. coli UTI. -Continue empiric IV Merrem pending urine culture results. -Supportive care.  2.  UTI -Patient with history of recurrent UTIs.  Patient noted to have UTI with ESBL E. Coli. -Urine cultures pending. -Leukocytosis trending down. -Continue empiric IV Merrem pending culture results.  3.  Gastroesophageal reflux disease PPI.  4.  Hypothyroidism -Resume home regimen Synthroid.  5.  Macrocytic anemia -Patient with no overt bleeding -Folate level at 34.7.  Vitamin B12 at 1862. -Check iron, ferritin, TIBC. -Hemoglobin currently at 10.5 -Follow  H&H. -Transfusion threshold hemoglobin < 7.  6.  Well-controlled diabetes mellitus type 2 -Hemoglobin A1c 5.6 (07/07/2020). -CBG 140 this morning. -Patient noted to be on Lantus 8 units daily which was recently decreased by her endocrinologist 06/12/2020. -Continue to hold Lantus. -Continue SSI.  7.  Depression -Resume home regimen Cymbalta.  8.  Fibromyalgia/osteoporosis/arthritis -Resume home regimen pain medication at half the dose and as mental status improves could resume back to home regimen.  -Resume Cymbalta -Resume Os-Cal  9.  Asthma -Patient with minimal expiratory wheezing. -Resume home regiment scheduled duo nebs. -Continue Breo Ellipta, Singulair, PPI  10.  Hyperlipidemia -Continue statin.   DVT prophylaxis: SCDs Code Status: Full Family Communication: Updated patient.  Updated granddaughter at bedside.  Updated daughter on the telephone who states she is in [place]. Disposition:   Status is: Inpatient    Dispo: The patient is from: Home              Anticipated d/c is to: Likely home with home health              Patient currently being treated for UTI, urine cultures pending, on IV antibiotics, not stable for discharge.   Difficult to place patient no       Consultants:   None  Procedures:   Chest x-ray 07/07/2020    Antimicrobials:   IV Merrem 07/07/2020>>>>>   Subjective: Patient laying in bed.  Denies any chest pain.  No shortness of breath.  Alert to self only.  Knows she is in the hospital.  When asked what year it is patient states her birth year of [birth year].  Patient is able to tell who  the president is.  Granddaughter at bedside who is very attentive.  Granddaughter stating patient has not gotten her scheduled nebulizer treatments.  Objective: Vitals:   07/08/20 0332 07/08/20 0822 07/08/20 0951 07/08/20 1331  BP: (!) 172/79  (!) 144/68 136/76  Pulse: (!) 105  98 81  Resp: 20  14 16   Temp: 97.7 F (36.5 C)  98.1 F (36.7 C) 98.1 F  (36.7 C)  TempSrc:   Axillary Oral  SpO2: 97% 95% 98% 97%  Weight:      Height:        Intake/Output Summary (Last 24 hours) at 07/08/2020 1624 Last data filed at 07/08/2020 1458 Gross per 24 hour  Intake 942.05 ml  Output 750 ml  Net 192.05 ml   Filed Weights   07/07/20 1045  Weight: 63.5 kg    Examination:  General exam: NAD Respiratory system: Minimal expiratory wheezing.  No crackles.  No rhonchi.  No use of accessory muscles of respiration.  Speaking in full sentences.   Cardiovascular system: S1 & S2 heard, RRR. No JVD, murmurs, rubs, gallops or clicks. No pedal edema. Gastrointestinal system: Abdomen is nondistended, soft and nontender. No organomegaly or masses felt. Normal bowel sounds heard. Central nervous system: Alert and oriented to self only. No focal neurological deficits.  Moving extremities spontaneously. Extremities: Symmetric 5 x 5 power. Skin: No rashes, lesions or ulcers Psychiatry: Judgement and insight appear poor to fair. Mood & affect appropriate.     Data Reviewed: I have personally reviewed following labs and imaging studies  CBC: Recent Labs  Lab 07/07/20 1113 07/08/20 0543  WBC 17.7* 16.0*  NEUTROABS 13.6*  --   HGB 10.9* 10.5*  HCT 33.3* 32.5*  MCV 116.8* 117.8*  PLT 352 211    Basic Metabolic Panel: Recent Labs  Lab 07/07/20 1113 07/08/20 0543  NA 136 134*  K 4.1 4.4  CL 106 103  CO2 19* 19*  GLUCOSE 104* 129*  BUN 21 14  CREATININE 1.05* 0.73  CALCIUM 8.9 8.6*    GFR: Estimated Creatinine Clearance: 43.2 mL/min (by C-G formula based on SCr of 0.73 mg/dL).  Liver Function Tests: Recent Labs  Lab 07/07/20 1113 07/08/20 0543  AST 14* 21  ALT 15 16  ALKPHOS 31* 34*  BILITOT 0.7 0.9  PROT 6.5 5.8*  ALBUMIN 3.3* 3.0*    CBG: Recent Labs  Lab 07/07/20 1535 07/07/20 2058 07/08/20 0737 07/08/20 1147  GLUCAP 99 174* 140* 139*     Recent Results (from the past 240 hour(s))  Urine culture     Status:  Abnormal (Preliminary result)   Collection Time: 07/07/20 11:01 AM   Specimen: Urine, Random  Result Value Ref Range Status   Specimen Description   Final    URINE, RANDOM Performed at [facility], [place] [address] [place], [place] [address]    Special Requests   Final    NONE Performed at [facility], [place] [address] [place], [place] [address]    Culture (A)  Final    40,000 COLONIES/mL ESCHERICHIA COLI SUSCEPTIBILITIES TO FOLLOW Performed at [facility], [place] [address], [place], [place] [address]    Report Status PENDING  Incomplete  Resp Panel by RT-PCR (Flu A&B, Covid) Nasopharyngeal Swab     Status: None   Collection Time: 07/07/20 12:03 PM   Specimen: Nasopharyngeal Swab; Nasopharyngeal(NP) swabs in vial transport medium  Result Value Ref Range Status   SARS Coronavirus 2 by RT PCR  NEGATIVE NEGATIVE Final    Comment: (NOTE) SARS-CoV-2 target nucleic acids are NOT DETECTED.  The SARS-CoV-2 RNA is generally detectable in upper respiratory specimens during the acute phase of infection. The lowest concentration of SARS-CoV-2 viral copies this assay can detect is 138 copies/mL. A negative result does not preclude SARS-Cov-2 infection and should not be used as the sole basis for treatment or other patient management decisions. A negative result may occur with  improper specimen collection/handling, submission of specimen other than nasopharyngeal swab, presence of viral mutation(s) within the areas targeted by this assay, and inadequate number of viral copies(<138 copies/mL). A negative result must be combined with clinical observations, patient history, and epidemiological information. The expected result is Negative.  Fact Sheet for Patients:  EntrepreneurPulse.com.au  Fact Sheet for Healthcare Providers:  IncredibleEmployment.be  This test is no t yet approved or cleared by the [place] FDA and  has been authorized for detection and/or diagnosis of SARS-CoV-2 by FDA under an Emergency Use Authorization (EUA). This EUA will remain  in effect (meaning this test can be used) for the duration of the COVID-19 declaration under Section 564(b)(1) of the Act, 21 U.S.C.section 360bbb-3(b)(1), unless the authorization is terminated  or revoked sooner.       Influenza A by PCR NEGATIVE NEGATIVE Final   Influenza B by PCR NEGATIVE NEGATIVE Final    Comment: (NOTE) The Xpert Xpress SARS-CoV-2/FLU/RSV plus assay is intended as an aid in the diagnosis of influenza from Nasopharyngeal swab specimens and should not be used as a sole basis for treatment. Nasal washings and aspirates are unacceptable for Xpert Xpress SARS-CoV-2/FLU/RSV testing.  Fact Sheet for Patients: EntrepreneurPulse.com.au  Fact Sheet for Healthcare Providers: IncredibleEmployment.be  This test is not yet approved or cleared by the [place] FDA and has been authorized for detection and/or diagnosis of SARS-CoV-2 by FDA under an Emergency Use Authorization (EUA). This EUA will remain in effect (meaning this test can be used) for the duration of the COVID-19 declaration under Section 564(b)(1) of the Act, 21 U.S.C. section 360bbb-3(b)(1), unless the authorization is terminated or revoked.  Performed at [facility], [place] [address], [place], [place] [address]   Blood Culture (routine x 2)     Status: None (Preliminary result)   Collection Time: 07/07/20 12:03 PM   Specimen: BLOOD  Result Value Ref Range Status   Specimen Description   Final    BLOOD LEFT ANTECUBITAL Performed at [facility] [address], [place], [place] [address]    Special Requests   Final    BOTTLES DRAWN AEROBIC AND ANAEROBIC Blood Culture adequate volume Performed at [place] [address] [place], [place] [address]     Culture   Final    NO GROWTH < 12 HOURS Performed at [facility] [address] [place], [place] [address]    Report Status PENDING  Incomplete  Blood Culture (routine x 2)     Status: None (Preliminary result)   Collection Time: 07/07/20 12:08 PM   Specimen: BLOOD  Result Value Ref Range Status   Specimen Description   Final    BLOOD RIGHT ANTECUBITAL Performed at [facility] [address], [place], [place] [address]    Special Requests   Final    BOTTLES DRAWN AEROBIC AND ANAEROBIC Blood Culture adequate volume Performed at [facility] [address], [place], [place] [address]    Culture  Final    NO GROWTH < 12 HOURS Performed at [facility] [address], [place], [place] [address]    Report Status PENDING  Incomplete         Radiology Studies: DG Chest Port 1 View  Result Date: 07/07/2020 CLINICAL DATA:  Sepsis. EXAM: PORTABLE CHEST 1 VIEW COMPARISON:  May 07, 2020. FINDINGS: The heart size and mediastinal contours are within normal limits. Both lungs are clear. The visualized skeletal structures are unremarkable. IMPRESSION: No active disease. Electronically Signed   By: Marijo Conception M.D.   On: 07/07/2020 12:28        Scheduled Meds: . amitriptyline  10 mg Oral QHS  . vitamin C  1,000 mg Oral BID  . calcium carbonate  500 mg of elemental calcium Oral BID WC  . cholecalciferol  1,000 Units Oral Daily  . DULoxetine  90 mg Oral Daily  . fluticasone furoate-vilanterol  1 puff Inhalation Daily   And  . umeclidinium bromide  1 puff Inhalation Daily  . guaiFENesin  400 mg Oral Q4H  . insulin aspart  0-5 Units Subcutaneous QHS  . insulin aspart  0-9 Units Subcutaneous TID WC  . ipratropium-albuterol  3 mL Nebulization TID  . [START ON 07/09/2020] levothyroxine  50 mcg Oral Q0600  . magnesium oxide  400 mg Oral Daily  . montelukast  10 mg Oral QHS  . pantoprazole  40 mg Oral BID  .  polyethylene glycol  17 g Oral Daily  . predniSONE  1 mg Oral BID WC  . rosuvastatin  10 mg Oral QPM   Continuous Infusions: . meropenem (MERREM) IV 1 g (07/08/20 0957)     LOS: 1 day    Time spent: 45 minutes    Irine Seal, MD [facility]   To contact the attending provider between 7A-7P or the covering provider during after hours 7P-7A, please log into the web site [email or web address] and access using universal [place] password for that web site. If you do not have the password, please call the hospital operator.  07/08/2020, 4:24 PM

## 2020-07-09 DIAGNOSIS — E43 Unspecified severe protein-calorie malnutrition: Secondary | ICD-10-CM

## 2020-07-09 LAB — GLUCOSE, CAPILLARY
Glucose-Capillary: 125 mg/dL — ABNORMAL HIGH (ref 70–99)
Glucose-Capillary: 260 mg/dL — ABNORMAL HIGH (ref 70–99)
Glucose-Capillary: 144 mg/dL — ABNORMAL HIGH (ref 70–99)
Glucose-Capillary: 175 mg/dL — ABNORMAL HIGH (ref 70–99)

## 2020-07-09 LAB — CBC
HCT: 31.3 % — ABNORMAL LOW (ref 36.0–46.0)
Hemoglobin: 10.3 g/dL — ABNORMAL LOW (ref 12.0–15.0)
MCH: 37.1 pg — ABNORMAL HIGH (ref 26.0–34.0)
MCHC: 32.9 g/dL (ref 30.0–36.0)
MCV: 112.6 fL — ABNORMAL HIGH (ref 80.0–100.0)
Platelets: 251 10*3/uL (ref 150–400)
RBC: 2.78 MIL/uL — ABNORMAL LOW (ref 3.87–5.11)
RDW: 14.3 % (ref 11.5–15.5)
WBC: 16.1 10*3/uL — ABNORMAL HIGH (ref 4.0–10.5)
nRBC: 0 % (ref 0.0–0.2)

## 2020-07-09 LAB — URINE CULTURE: Culture: 40000 — AB

## 2020-07-09 LAB — BASIC METABOLIC PANEL
Anion gap: 9 (ref 5–15)
BUN: 16 mg/dL (ref 8–23)
CO2: 24 mmol/L (ref 22–32)
Calcium: 8.9 mg/dL (ref 8.9–10.3)
Chloride: 106 mmol/L (ref 98–111)
Creatinine, Ser: 0.89 mg/dL (ref 0.44–1.00)
GFR, Estimated: >60 mL/min (ref >60)
Glucose, Bld: 138 mg/dL — ABNORMAL HIGH (ref 70–99)
Potassium: 3.7 mmol/L (ref 3.5–5.1)
Sodium: 139 mmol/L (ref 135–145)

## 2020-07-09 LAB — MAGNESIUM: Magnesium: 1.9 mg/dL (ref 1.7–2.4)

## 2020-07-09 LAB — IRON AND TIBC
Iron: 24 ug/dL — ABNORMAL LOW (ref 28–170)
Saturation Ratios: 11 % (ref 10.4–31.8)
TIBC: 221 ug/dL — ABNORMAL LOW (ref 250–450)
UIBC: 197 ug/dL

## 2020-07-09 LAB — RETICULOCYTES
Immature Retic Fract: 18.5 % — ABNORMAL HIGH (ref 2.3–15.9)
RBC.: 2.72 MIL/uL — ABNORMAL LOW (ref 3.87–5.11)
Retic Count, Absolute: 44.9 10*3/uL (ref 19.0–186.0)
Retic Ct Pct: 1.7 % (ref 0.4–3.1)

## 2020-07-09 LAB — FERRITIN: Ferritin: 155 ng/mL (ref 11–307)

## 2020-07-09 NOTE — Plan of Care (Signed)
  Problem: Nutrition: Goal: Adequate nutrition will be maintained Outcome: Progressing   Problem: Elimination: Goal: Will not experience complications related to bowel motility Outcome: Progressing   Problem: Pain Managment: Goal: General experience of comfort will improve Outcome: Progressing   

## 2020-07-09 NOTE — Evaluation (Signed)
Physical Therapy Evaluation Patient Details Name: Kayla Thompson MRN: [id number] DOB: September 25, 1935 Today's Date: 07/09/2020   History of Present Illness  Patient is an 85 yo pleasant female with disorientation, AMS. PMH: diabetes, arthritis, hypothyroidism, fibromyalgia, asthma, recurent UTI with history of ESBL E. coli    Clinical Impression  Pt admitted with above diagnosis. At baseline, pt has 24/7 aides assisting at home with bathing, dressing, SUPV for ambulation using rollator, and aides complete all household chores. Pt currently requiring mod A for sitting up to EOB, limited due to bil shoulder pain and limited AROM. Pt requiring min A to power up to stand, cues for hand placement and pivot to [facility]. Pt is requires total A for pericare, then takes limited steps over to chair with RW and cues for upright posture and widening BOS with fair carryover. Pt's goal is to return home with aides assisting, recommend return home with 24 hr assist. Pt also states active with HHPT, recommend return for strengthening and mobility training. Pt currently with functional limitations due to the deficits listed below (see PT Problem List). Pt will benefit from skilled PT to increase their independence and safety with mobility to allow discharge to the venue listed below.       Follow Up Recommendations Home health PT;Supervision/Assistance - 24 hour    Equipment Recommendations  None recommended by PT    Recommendations for Other Services       Precautions / Restrictions Precautions Precautions: Fall Restrictions Weight Bearing Restrictions: No      Mobility  Bed Mobility Overal bed mobility: Needs Assistance Bed Mobility: Supine to Sit  Supine to sit: HOB elevated;Mod assist  General bed mobility comments: HOB elevated, cues for UE assistance but limited due to chronic bil shoudler pain, ultimately requires mod A to upright trunk and scoot out to EOB    Transfers Overall transfer level:  Needs assistance Equipment used: Rolling walker (2 wheeled) Transfers: Sit to/from [facility] Sit to Stand: Min assist Stand pivot transfers: Min assist  General transfer comment: min A to power up to stand, BLE braced against front of bed, cues to widen BOS and shift weight into balls of feet  Ambulation/Gait Ambulation/Gait assistance: Min guard Gait Distance (Feet): 5 Feet Assistive device: Rolling walker (2 wheeled) Gait Pattern/deviations: Step-through pattern;Decreased stride length;Shuffle;Trunk flexed;Narrow base of support Gait velocity: decreased   General Gait Details: short, shuffling steps with trunk flexed and narrow BOS, cued for widening BOS and upright posture with minimal improvement  Stairs            Wheelchair Mobility    Modified Rankin (Stroke Patients Only)       Balance Overall balance assessment: Needs assistance Sitting-balance support: Feet supported;Single extremity supported Sitting balance-Leahy Scale: Fair Sitting balance - Comments: seated EOB   Standing balance support: During functional activity;Bilateral upper extremity supported Standing balance-Leahy Scale: Poor Standing balance comment: reliant on UE support        Pertinent Vitals/Pain Pain Assessment: 0-10 Pain Score: 8  Pain Location: bil shoulders Pain Descriptors / Indicators: Other (Comment);Sore;Aching (chronic) Pain Intervention(s): Limited activity within patient's tolerance;Monitored during session    [place] expects to be discharged to:: Private residence Living Arrangements: Alone Available Help at Discharge: Family;Personal care attendant;Available 24 hours/day Type of Home: House Home Access: Level entry     Home Layout: One level Home Equipment: Walker - 2 wheels;Bedside commode;Shower seat;Walker - 4 wheels (adjustable bed) Additional Comments: Pt reports daughter visits few  times/week, active with HHPT.    Prior  Function Level of Independence: Needs assistance;Independent with assistive device(s)   Gait / Transfers Assistance Needed: Pt reports aides assist her OOB and with transfers, SUPV with ambulation in home and community  ADL's / Homemaking Assistance Needed: Pt reports home aides complete household chores, min A with bathing and dressing due to limited shoulder mobility L>R        Hand Dominance   Dominant Hand: Right    Extremity/Trunk Assessment   Upper Extremity Assessment Upper Extremity Assessment: Defer to OT evaluation    Lower Extremity Assessment Lower Extremity Assessment: Overall WFL for tasks assessed;RLE deficits/detail;LLE deficits/detail RLE Deficits / Details: AROM WNL, strength 4/5 throughout, denies numbness/tingling throughout RLE Sensation: WNL LLE Deficits / Details: AROM WNL, strength 4/5 for hip and ankle, knee 3+/5, denies numbness/tingling LLE Sensation: WNL    Cervical / Trunk Assessment Cervical / Trunk Assessment: Kyphotic  Communication   Communication: No difficulties  Cognition Arousal/Alertness: Awake/alert Behavior During Therapy: WFL for tasks assessed/performed Overall Cognitive Status: Within Functional Limits for tasks assessed  General Comments: Pt alert and oriented to self, location and home set up      General Comments      Exercises     Assessment/Plan    PT Assessment Patient needs continued PT services  PT Problem List Decreased strength;Decreased activity tolerance;Decreased balance;Decreased mobility;Pain       PT Treatment Interventions DME instruction;Gait training;Functional mobility training;Therapeutic activities;Therapeutic exercise;Balance training;Patient/family education    PT Goals (Current goals can be found in the Care Plan section)  Acute Rehab PT Goals Patient Stated Goal: "I have aides" PT Goal Formulation: With patient Time For Goal Achievement: 07/23/20 Potential to Achieve Goals: Good     Frequency Min 3X/week   Barriers to discharge        Co-evaluation               AM-PAC PT "6 Clicks" Mobility  Outcome Measure Help needed turning from your back to your side while in a flat bed without using bedrails?: A Little Help needed moving from lying on your back to sitting on the side of a flat bed without using bedrails?: A Lot Help needed moving to and from a bed to a chair (including a wheelchair)?: A Little Help needed standing up from a chair using your arms (e.g., wheelchair or bedside chair)?: A Little Help needed to walk in hospital room?: A Little Help needed climbing 3-5 steps with a railing? : A Lot 6 Click Score: 16    End of Session Equipment Utilized During Treatment: Gait belt Activity Tolerance: Patient tolerated treatment well Patient left: in chair;with call bell/phone within reach;with nursing/sitter in room (handed chair alarm to RN in room) Nurse Communication: Mobility status PT Visit Diagnosis: Other abnormalities of gait and mobility (R26.89);Difficulty in walking, not elsewhere classified (R26.2)    Time: 1005-1035 PT Time Calculation (min) (ACUTE ONLY): 30 min   Charges:   PT Evaluation $PT Eval Low Complexity: 1 Low PT Treatments $Therapeutic Activity: 8-22 mins         Tori Wai Litt PT, DPT 07/09/20, 11:24 AM

## 2020-07-09 NOTE — Evaluation (Signed)
Occupational Therapy Evaluation Patient Details Name: Kayla Thompson MRN: [id number] DOB: Mar 02, 1936 Today's Date: 07/09/2020    History of Present Illness Patient is an 85 yo pleasant female with disorientation, AMS. PMH: diabetes, arthritis, hypothyroidism, fibromyalgia, asthma, recurent UTI with history of ESBL E. coli   Clinical Impression   Patient presents near her baseline with impaired shoulder ROM and decreased functional use of upper extremities needing assistance with bed mobility, sit to stand and ADLs. Patient able to ambulate to bathroom with RW with min guard and needed max assist for toileting. Patient needs assistance for all ADLs - set up for feeding and grooming and assistance for all dressing. Patient at or near baseline. Has 24/7 assistance and all needed DME. No further OT needs.    Follow Up Recommendations  No OT follow up    Equipment Recommendations  None recommended by OT    Recommendations for Other Services       Precautions / Restrictions Precautions Precautions: Fall Restrictions Weight Bearing Restrictions: No      Mobility Bed Mobility Overal bed mobility: Needs Assistance Bed Mobility: Sit to Supine     Supine to sit: Mod assist     General bed mobility comments: Mod assist for LEs to transfer back into bed.    Transfers Overall transfer level: Needs assistance Equipment used: Rolling walker (2 wheeled) Transfers: Sit to/from [facility] Sit to Stand: Min assist Stand pivot transfers: Min assist       General transfer comment: Min assist for sit to stand with RW. Min guard to ambulate to bathroom    Balance Overall balance assessment: Mild deficits observed, not formally tested Sitting-balance support: Feet supported;Single extremity supported Sitting balance-Leahy Scale: Fair Sitting balance - Comments: seated EOB   Standing balance support: During functional activity;Bilateral upper extremity  supported Standing balance-Leahy Scale: Poor Standing balance comment: reliant on UE support                           ADL either performed or assessed with clinical judgement   ADL Overall ADL's : Needs assistance/impaired Eating/Feeding: Set up;Sitting   Grooming: Set up;Wash/dry face   Upper Body Bathing: Minimal assistance;Set up;Sitting   Lower Body Bathing: Maximal assistance;Sit to/from stand   Upper Body Dressing : Moderate assistance;Set up;Sitting   Lower Body Dressing: Maximal assistance;Sit to/from stand   Toilet Transfer: Minimal assistance;RW;Regular Museum/gallery exhibitions officer and Hygiene: Maximal assistance;Sit to/from stand Toileting - Clothing Manipulation Details (indicate cue type and reason): assistance needed for clothing management and perianal cleaning             Vision   Vision Assessment?: No apparent visual deficits     Perception     Praxis      Pertinent Vitals/Pain Pain Assessment: No/denies pain Pain Score: 8  Pain Location: bil shoulders Pain Descriptors / Indicators: Other (Comment);Sore;Aching (chronic) Pain Intervention(s): Limited activity within patient's tolerance;Monitored during session     Hand Dominance Right   Extremity/Trunk Assessment Upper Extremity Assessment Upper Extremity Assessment:  (Less than 45 degrees shoulder ROM bilaterally, WFL ROM of elbow, wrist, forearm and fingers. strength not tested. Overall weakness in upper extremities.)   Lower Extremity Assessment Lower Extremity Assessment: Defer to PT evaluation RLE Deficits / Details: AROM WNL, strength 4/5 throughout, denies numbness/tingling throughout RLE Sensation: WNL LLE Deficits / Details: AROM WNL, strength 4/5 for hip and ankle, knee 3+/5, denies numbness/tingling LLE Sensation: WNL  Cervical / Trunk Assessment Cervical / Trunk Assessment: Kyphotic   Communication Communication Communication: No difficulties    Cognition Arousal/Alertness: Awake/alert Behavior During Therapy: WFL for tasks assessed/performed Overall Cognitive Status: No family/caregiver present to determine baseline cognitive functioning                                 General Comments: Pt alert and oriented to self, location and home set up   General Comments       Exercises     Shoulder Instructions      Home Living Family/patient expects to be discharged to:: Private residence Living Arrangements: Alone Available Help at Discharge: Family;Personal care attendant;Available 24 hours/day Type of Home: House Home Access: Level entry     Home Layout: One level     Bathroom Shower/Tub: Walk-in shower         Home Equipment: Environmental consultant - 2 wheels;Bedside commode;Shower seat;Walker - 4 wheels (adjustable bed)   Additional Comments: Pt reports daughter visits few times/week, active with HHPT.      Prior Functioning/Environment Level of Independence: Needs assistance;Independent with assistive device(s)  Gait / Transfers Assistance Needed: Pt reports aides assist her OOB and with transfers, SUPV with ambulation in home and community ADL's / Homemaking Assistance Needed: Pt reports home aides complete household chores, Ass with bathing, dressing, toileting due to limited shoulder mobility L>R            OT Problem List: Decreased cognition;Decreased strength      OT Treatment/Interventions:      OT Goals(Current goals can be found in the care plan section) Acute Rehab OT Goals Patient Stated Goal: "I have aides" OT Goal Formulation: All assessment and education complete, DC therapy  OT Frequency:     Barriers to D/C:            Co-evaluation              AM-PAC OT "6 Clicks" Daily Activity     Outcome Measure Help from another person eating meals?: A Little Help from another person taking care of personal grooming?: A Little Help from another person toileting, which includes using  toliet, bedpan, or urinal?: A Lot Help from another person bathing (including washing, rinsing, drying)?: A Lot Help from another person to put on and taking off regular upper body clothing?: A Lot Help from another person to put on and taking off regular lower body clothing?: Total 6 Click Score: 13   End of Session Equipment Utilized During Treatment: Rolling walker Nurse Communication:  (okay to see per rn)  Activity Tolerance: Patient tolerated treatment well Patient left: in bed;with call bell/phone within reach;with bed alarm set  OT Visit Diagnosis: Muscle weakness (generalized) (M62.81);Other abnormalities of gait and mobility (R26.89)                Time: 6270-3500 OT Time Calculation (min): 18 min Charges:  OT General Charges $OT Visit: 1 Visit OT Evaluation $OT Eval Moderate Complexity: 1 Mod  Nevaeh Korte, OTR/L [place]  Office [phone] Pager: [place] 07/09/2020, 2:28 PM

## 2020-07-09 NOTE — Progress Notes (Addendum)
PROGRESS NOTE    Kayla Thompson  PNT:[id number] DOB: 12/24/1935 DOA: 07/07/2020 PCP: [name], MD    Brief Narrative:   Patient is a 85 year old pleasant female with history of type 2 diabetes, arthritis, hypothyroidism who presented to the ED with disorientation, altered mental status with symptoms starting 4 days prior to admission.  Family was concerned of a UTI.  On presentation to the ED patient noted to have a leukocytosis with a white count of 17,000, urinalysis consistent with a UTI.  Patient noted with history of ESBL E. coli and as was placed empirically on IV Merrem.  Assessment & Plan:   Principal Problem:   Acute metabolic encephalopathy Active Problems:   Hypothyroidism   GERD (gastroesophageal reflux disease)   Severe protein-calorie malnutrition (HCC)   Acute lower UTI   Hypertension associated with diabetes ([place])   Hyperlipidemia associated with type 2 diabetes mellitus ([place])   Depression with anxiety   AMS (altered mental status)  Acute metabolic encephalopathy Thought to be secondary to UTI.  Currently on IV Merrem.  History of ESBL E. coli.  Has improved at this time. Blood culture is negative.  Urine culture with ESBL E. coli.  Sensitive to ciprofloxacin.  Resistant to multiple antibiotics.  UTI Patient with history of recurrent UTI including ESBL E. coli.  Currently on IV Merrem.  With significant leukocytosis.  Gastroesophageal reflux disease PPI.  Hypothyroidism Continue Synthroid  Macrocytic anemia Folic acid level of 08.6.  Vitamin B12 within normal limits.  Transfuse for hemoglobin less than 7.   Diabetes mellitus type 2 Hemoglobin A1c 5.6 (07/07/2020).  on Lantus at home., on 8 units as decreased by endocrinologist on 06/12/2020.  Continue sliding scale insulin.  POC glucose of 260.  Depression Continue Cymbalta  Fibromyalgia/osteoporosis/arthritis Continue Cymbalta Os-Cal.  On decreased dose of pain medication from  home  Chronic asthma Continue DuoNebs, Breo Ellipta, Singulair, PPI  Hyperlipidemia On Crestor.  Continue  DVT prophylaxis: SCDs  Code Status:  Full code  Family Communication:  Spoke with patient's daughter on the phone and updated her about the clinical condition of the patient.   Disposition:   Status is: Inpatient Dispo: The patient is from: Home              Anticipated d/c is to: Likely home with home health likely in am.              Patient currently on IV antibiotics,with persistent leukocytosis.   Difficult to place patient no   Consultants:   None  Procedures:   Chest x-ray 07/07/2020  Antimicrobials:   IV Merrem 07/07/2020>  Subjective: Patient was seen and examined at bedside.  Patient denies any nausea, vomiting, fever, chills or rigor.   Objective: Vitals:   07/08/20 1331 07/08/20 2128 07/09/20 0540 07/09/20 0733  BP: 136/76 126/67 135/77   Pulse: 81 (!) 105 88   Resp: 16 18 18    Temp: 98.1 F (36.7 C) 98 F (36.7 C) 98.2 F (36.8 C)   TempSrc: Oral     SpO2: 97% 98% 100% 100%  Weight:      Height:        Intake/Output Summary (Last 24 hours) at 07/09/2020 1125 Last data filed at 07/09/2020 1044 Gross per 24 hour  Intake 472 ml  Output 950 ml  Net -478 ml   Filed Weights   07/07/20 1045  Weight: 63.5 kg   Body mass index is 28.28 kg/m.  Physical examination: General:  Average built, not in obvious distress HENT:   No scleral pallor or icterus noted. Oral mucosa is moist.  Chest:   Diminished breath sounds bilaterally. No crackles or wheezes.  CVS: S1 &S2 heard. No murmur.  Regular rate and rhythm. Abdomen: Soft, nontender, nondistended.  Bowel sounds are heard.   Extremities: No cyanosis, clubbing or edema.  Peripheral pulses are palpable. Psych: Alert, awake and oriented to self only, normal mood CNS:  No cranial nerve deficits.  Power equal in all extremities.   Skin: Warm and dry.  No rashes noted.  Data Reviewed: I have  personally reviewed following labs and imaging studies  CBC: Recent Labs  Lab 07/07/20 1113 07/08/20 0543 07/09/20 0528  WBC 17.7* 16.0* 16.1*  NEUTROABS 13.6*  --   --   HGB 10.9* 10.5* 10.3*  HCT 33.3* 32.5* 31.3*  MCV 116.8* 117.8* 112.6*  PLT 352 254 315    Basic Metabolic Panel: Recent Labs  Lab 07/07/20 1113 07/08/20 0543 07/09/20 0528  NA 136 134* 139  K 4.1 4.4 3.7  CL 106 103 106  CO2 19* 19* 24  GLUCOSE 104* 129* 138*  BUN 21 14 16   CREATININE 1.05* 0.73 0.89  CALCIUM 8.9 8.6* 8.9  MG  --   --  1.9    GFR: Estimated Creatinine Clearance: 38.8 mL/min (by C-G formula based on SCr of 0.89 mg/dL).  Liver Function Tests: Recent Labs  Lab 07/07/20 1113 07/08/20 0543  AST 14* 21  ALT 15 16  ALKPHOS 31* 34*  BILITOT 0.7 0.9  PROT 6.5 5.8*  ALBUMIN 3.3* 3.0*    CBG: Recent Labs  Lab 07/08/20 1147 07/08/20 1633 07/08/20 2230 07/09/20 0736 07/09/20 1115  GLUCAP 139* 173* 136* 125* 260*     Recent Results (from the past 240 hour(s))  Urine culture     Status: Abnormal   Collection Time: 07/07/20 11:01 AM   Specimen: Urine, Random  Result Value Ref Range Status   Specimen Description   Final    URINE, RANDOM Performed at [facility], [place] [address] [place], [place] [address]    Special Requests   Final    NONE Performed at [facility], [place] [address], [place], [place] [address]    Culture (A)  Final    40,000 COLONIES/mL ESCHERICHIA COLI Confirmed Extended Spectrum Beta-Lactamase Producer (ESBL).  In bloodstream infections from ESBL organisms, carbapenems are preferred over piperacillin/tazobactam. They are shown to have a lower risk of mortality.    Report Status 07/09/2020 FINAL  Final   Organism ID, Bacteria ESCHERICHIA COLI (A)  Final      Susceptibility   Escherichia coli - MIC*    AMPICILLIN >=32 RESISTANT Resistant     CEFAZOLIN >=64 RESISTANT Resistant     CEFEPIME 8 INTERMEDIATE  Intermediate     CEFTRIAXONE >=64 RESISTANT Resistant     CIPROFLOXACIN 1 SENSITIVE Sensitive     GENTAMICIN >=16 RESISTANT Resistant     IMIPENEM <=0.25 SENSITIVE Sensitive     NITROFURANTOIN 64 INTERMEDIATE Intermediate     TRIMETH/SULFA >=320 RESISTANT Resistant     AMPICILLIN/SULBACTAM >=32 RESISTANT Resistant     PIP/TAZO <=4 SENSITIVE Sensitive     * 40,000 COLONIES/mL ESCHERICHIA COLI  Resp Panel by RT-PCR (Flu A&B, Covid) Nasopharyngeal Swab     Status: None   Collection Time: 07/07/20 12:03 PM   Specimen: Nasopharyngeal Swab; Nasopharyngeal(NP) swabs in vial transport medium  Result Value Ref Range Status  SARS Coronavirus 2 by RT PCR NEGATIVE NEGATIVE Final    Comment: (NOTE) SARS-CoV-2 target nucleic acids are NOT DETECTED.  The SARS-CoV-2 RNA is generally detectable in upper respiratory specimens during the acute phase of infection. The lowest concentration of SARS-CoV-2 viral copies this assay can detect is 138 copies/mL. A negative result does not preclude SARS-Cov-2 infection and should not be used as the sole basis for treatment or other patient management decisions. A negative result may occur with  improper specimen collection/handling, submission of specimen other than nasopharyngeal swab, presence of viral mutation(s) within the areas targeted by this assay, and inadequate number of viral copies(<138 copies/mL). A negative result must be combined with clinical observations, patient history, and epidemiological information. The expected result is Negative.  Fact Sheet for Patients:  EntrepreneurPulse.com.au  Fact Sheet for Healthcare Providers:  IncredibleEmployment.be  This test is no t yet approved or cleared by the [place] FDA and  has been authorized for detection and/or diagnosis of SARS-CoV-2 by FDA under an Emergency Use Authorization (EUA). This EUA will remain  in effect (meaning this test can be used) for  the duration of the COVID-19 declaration under Section 564(b)(1) of the Act, 21 U.S.C.section 360bbb-3(b)(1), unless the authorization is terminated  or revoked sooner.       Influenza A by PCR NEGATIVE NEGATIVE Final   Influenza B by PCR NEGATIVE NEGATIVE Final    Comment: (NOTE) The Xpert Xpress SARS-CoV-2/FLU/RSV plus assay is intended as an aid in the diagnosis of influenza from Nasopharyngeal swab specimens and should not be used as a sole basis for treatment. Nasal washings and aspirates are unacceptable for Xpert Xpress SARS-CoV-2/FLU/RSV testing.  Fact Sheet for Patients: EntrepreneurPulse.com.au  Fact Sheet for Healthcare Providers: IncredibleEmployment.be  This test is not yet approved or cleared by the [place] FDA and has been authorized for detection and/or diagnosis of SARS-CoV-2 by FDA under an Emergency Use Authorization (EUA). This EUA will remain in effect (meaning this test can be used) for the duration of the COVID-19 declaration under Section 564(b)(1) of the Act, 21 U.S.C. section 360bbb-3(b)(1), unless the authorization is terminated or revoked.  Performed at [facility], [place] [address], [place], [place] [address]   Blood Culture (routine x 2)     Status: None (Preliminary result)   Collection Time: 07/07/20 12:03 PM   Specimen: BLOOD  Result Value Ref Range Status   Specimen Description   Final    BLOOD LEFT ANTECUBITAL Performed at [place] [address], [place], [place] [address]    Special Requests   Final    BOTTLES DRAWN AEROBIC AND ANAEROBIC Blood Culture adequate volume Performed at [place] [address] [place], [place] [address]    Culture   Final    NO GROWTH 2 DAYS Performed at [place] [address], [place], [place] [address]    Report Status PENDING  Incomplete  Blood Culture (routine x 2)     Status: None  (Preliminary result)   Collection Time: 07/07/20 12:08 PM   Specimen: BLOOD  Result Value Ref Range Status   Specimen Description   Final    BLOOD RIGHT ANTECUBITAL Performed at [place] [address], [place], [place] [address]    Special Requests   Final    BOTTLES DRAWN AEROBIC AND ANAEROBIC Blood Culture adequate volume Performed at [place] [address], [place],  [address]  Culture   Final    NO GROWTH 2 DAYS Performed at [facility], [place] [address] [place], [place] [address]    Report Status PENDING  Incomplete      Radiology Studies: DG Chest Port 1 View  Result Date: 07/07/2020 CLINICAL DATA:  Sepsis. EXAM: PORTABLE CHEST 1 VIEW COMPARISON:  May 07, 2020. FINDINGS: The heart size and mediastinal contours are within normal limits. Both lungs are clear. The visualized skeletal structures are unremarkable. IMPRESSION: No active disease. Electronically Signed   By: Marijo Conception M.D.   On: 07/07/2020 12:28        Scheduled Meds: . amitriptyline  10 mg Oral QHS  . vitamin C  1,000 mg Oral BID  . calcium carbonate  500 mg of elemental calcium Oral BID WC  . cholecalciferol  1,000 Units Oral Daily  . DULoxetine  90 mg Oral Daily  . fluticasone furoate-vilanterol  1 puff Inhalation Daily   And  . umeclidinium bromide  1 puff Inhalation Daily  . guaiFENesin  400 mg Oral Q4H  . insulin aspart  0-5 Units Subcutaneous QHS  . insulin aspart  0-9 Units Subcutaneous TID WC  . ipratropium-albuterol  3 mL Nebulization TID  . levothyroxine  50 mcg Oral Q0600  . magnesium oxide  400 mg Oral Daily  . montelukast  10 mg Oral QHS  . pantoprazole  40 mg Oral BID  . polyethylene glycol  17 g Oral Daily  . predniSONE  1 mg Oral BID WC  . rosuvastatin  10 mg Oral QPM   Continuous Infusions: . meropenem (MERREM) IV 1 g (07/09/20 1054)     LOS: 2 days    Flora Lipps, MD [facility] 07/09/2020,  11:25 AM

## 2020-07-10 LAB — CBC
HCT: 31 % — ABNORMAL LOW (ref 36.0–46.0)
Hemoglobin: 10 g/dL — ABNORMAL LOW (ref 12.0–15.0)
MCH: 37.5 pg — ABNORMAL HIGH (ref 26.0–34.0)
MCHC: 32.3 g/dL (ref 30.0–36.0)
MCV: 116.1 fL — ABNORMAL HIGH (ref 80.0–100.0)
Platelets: 269 10*3/uL (ref 150–400)
RBC: 2.67 MIL/uL — ABNORMAL LOW (ref 3.87–5.11)
RDW: 14.1 % (ref 11.5–15.5)
WBC: 13.9 10*3/uL — ABNORMAL HIGH (ref 4.0–10.5)
nRBC: 0 % (ref 0.0–0.2)

## 2020-07-10 LAB — GLUCOSE, CAPILLARY: Glucose-Capillary: 128 mg/dL — ABNORMAL HIGH (ref 70–99)

## 2020-07-10 MED ORDER — IPRATROPIUM-ALBUTEROL 0.5-2.5 (3) MG/3ML IN SOLN
3.0000 mL | Freq: Two times a day (BID) | RESPIRATORY_TRACT | Status: DC
  Administered 2020-07-10: 3 mL via RESPIRATORY_TRACT
  Filled 2020-07-10: qty 3

## 2020-07-10 MED ORDER — CIPROFLOXACIN HCL 500 MG PO TABS: 500.0000 mg | ORAL_TABLET | Freq: Two times a day (BID) | ORAL | 0 refills | Status: AC

## 2020-07-10 NOTE — Care Management Important Message (Signed)
Important Message  Patient Details IM letter given to the Patient. Name: Kayla Thompson MRN: [id number] Date of Birth: [birth year]   Medicare Important Message Given:  Yes     Kerin Salen 07/10/2020, 11:24 AM

## 2020-07-10 NOTE — Discharge Summary (Signed)
Physician Discharge Summary  Kayla Thompson [id number]:[id number] DOB: 1935/10/19 DOA: 07/07/2020  PCP: Myrlene Broker, MD  Admit date: 07/07/2020 Discharge date: 07/10/2020  Admitted From: Home  Discharge disposition: Home with home health  Recommendations for Outpatient Follow-Up:   . Follow up with your primary care provider in one week.  . Check CBC, BMP, magnesium in the next visit  Discharge Diagnosis:   Principal Problem:   Acute metabolic encephalopathy Active Problems:   Hypothyroidism   GERD (gastroesophageal reflux disease)   Severe protein-calorie malnutrition (HCC)   Acute lower UTI   Hypertension associated with diabetes (HCC)   Hyperlipidemia associated with type 2 diabetes mellitus (HCC)   Depression with anxiety   AMS (altered mental status)   Discharge Condition: Improved.  Diet recommendation: Low sodium, heart healthy.  Carbohydrate-modified.    Wound care: None.  Code status: DNR   History of Present Illness:   Patient is a 85 year old pleasant female with history of type 2 diabetes, arthritis, hypothyroidism who presented to the ED with disorientation, altered mental status with symptoms starting 4 days prior to admission.  Family was concerned of a UTI.  On presentation to the ED patient noted to have a leukocytosis with a white count of 17,000, urinalysis consistent with a UTI.  Patient noted with history of ESBL E. coli and as was placed empirically on IV Merrem.   Hospital Course:   Following conditions were addressed during hospitalization as listed below,  Acute metabolic encephalopathy Thought to be secondary to UTI.  Received IV Merrem.  History of ESBL E. coli.  Has improved at this time. Blood culture is negative.  Urine culture with ESBL E. coli.  Sensitive to ciprofloxacin.  Resistant to multiple antibiotics.  We will continue ciprofloxacin for the next 3 days to complete 5-day course.   UTI Patient with history of recurrent UTI  including ESBL E. coli.  Received IV Merrem.    Leukocytosis trending down.  Patient afebrile.  No further urinary symptoms.  Patient is on low-dose prednisone at home.   Gastroesophageal reflux disease PPI.   Hypothyroidism Continue Synthroid   Macrocytic anemia Folic acid level of 34.0.  Vitamin B12 within normal limits.     Diabetes mellitus type 2 Hemoglobin A1c 5.6 (07/07/2020).  on Lantus at home., on 8 units as decreased by endocrinologist on 06/12/2020.    Continue diabetic diet   Depression Continue Cymbalta   Fibromyalgia/osteoporosis/arthritis Continue Cymbalta,Os-Cal.     Chronic asthma Continue DuoNebs, Breo Ellipta, Singulair, PPI   Hyperlipidemia On Crestor.  Continue   Disposition.  At this time, patient is stable for disposition home with home health.  Spoke with the patient's daughter regarding disposition.  Medical Consultants:    None.  Procedures:   None   Subjective:   Today, patient was seen and examined at bedside.  Denies interval complaints.  Denies any fever, chills, nausea, vomiting.  Discharge Exam:   Vitals:   07/10/20 0406 07/10/20 0833  BP: 137/62   Pulse: 78   Resp: 18   Temp: 97.8 F (36.6 C)   SpO2: 98% 98%   Vitals:   07/09/20 0733 07/09/20 2038 07/10/20 0406 07/10/20 0833  BP:  119/63 137/62   Pulse:  99 78   Resp:  18 18   Temp:  98 F (36.7 C) 97.8 F (36.6 C)   TempSrc:      SpO2: 100% 98% 98% 98%  Weight:      Height:  General: Alert awake, not in obvious distress HENT: pupils equally reacting to light,  No scleral pallor or icterus noted. Oral mucosa is moist.  Chest:  Clear breath sounds.  Diminished breath sounds bilaterally. No crackles or wheezes.  CVS: S1 &S2 heard. No murmur.  Regular rate and rhythm. Abdomen: Soft, nontender, nondistended.  Bowel sounds are heard.   Extremities: No cyanosis, clubbing or edema.  Peripheral pulses are palpable. Psych: Alert, awake and oriented to self, normal  mood CNS:  No cranial nerve deficits.  Power equal in all extremities.   Skin: Warm and dry.  No rashes noted.  The results of significant diagnostics from this hospitalization (including imaging, microbiology, ancillary and laboratory) are listed below for reference.     Diagnostic Studies:   DG Chest Port 1 View  Result Date: 07/07/2020 CLINICAL DATA:  Sepsis. EXAM: PORTABLE CHEST 1 VIEW COMPARISON:  May 07, 2020. FINDINGS: The heart size and mediastinal contours are within normal limits. Both lungs are clear. The visualized skeletal structures are unremarkable. IMPRESSION: No active disease. Electronically Signed   By: Marijo Conception M.D.   On: 07/07/2020 12:28     Labs:   Basic Metabolic Panel: Recent Labs  Lab 07/07/20 1113 07/08/20 0543 07/09/20 0528  NA 136 134* 139  K 4.1 4.4 3.7  CL 106 103 106  CO2 19* 19* 24  GLUCOSE 104* 129* 138*  BUN $Re'21 14 16  'TGi$ CREATININE 1.05* 0.73 0.89  CALCIUM 8.9 8.6* 8.9  MG  --   --  1.9   GFR Estimated Creatinine Clearance: 38.8 mL/min (by C-G formula based on SCr of 0.89 mg/dL). Liver Function Tests: Recent Labs  Lab 07/07/20 1113 07/08/20 0543  AST 14* 21  ALT 15 16  ALKPHOS 31* 34*  BILITOT 0.7 0.9  PROT 6.5 5.8*  ALBUMIN 3.3* 3.0*   No results for input(s): LIPASE, AMYLASE in the last 168 hours. No results for input(s): AMMONIA in the last 168 hours. Coagulation profile Recent Labs  Lab 07/07/20 1225  INR 1.0    CBC: Recent Labs  Lab 07/07/20 1113 07/08/20 0543 07/09/20 0528 07/10/20 0511  WBC 17.7* 16.0* 16.1* 13.9*  NEUTROABS 13.6*  --   --   --   HGB 10.9* 10.5* 10.3* 10.0*  HCT 33.3* 32.5* 31.3* 31.0*  MCV 116.8* 117.8* 112.6* 116.1*  PLT 352 254 251 269   Cardiac Enzymes: No results for input(s): CKTOTAL, CKMB, CKMBINDEX, TROPONINI in the last 168 hours. BNP: Invalid input(s): POCBNP CBG: Recent Labs  Lab 07/09/20 0736 07/09/20 1115 07/09/20 1631 07/09/20 2143 07/10/20 0758  GLUCAP  125* 260* 144* 175* 128*   D-Dimer No results for input(s): DDIMER in the last 72 hours. Hgb A1c Recent Labs    07/07/20 1113  HGBA1C 5.6   Lipid Profile No results for input(s): CHOL, HDL, LDLCALC, TRIG, CHOLHDL, LDLDIRECT in the last 72 hours. Thyroid function studies No results for input(s): TSH, T4TOTAL, T3FREE, THYROIDAB in the last 72 hours.  Invalid input(s): FREET3 Anemia work up Recent Labs    07/07/20 1536 07/09/20 0528  VITAMINB12 1,862*  --   FOLATE 34.7  --   FERRITIN  --  155  TIBC  --  221*  IRON  --  24*  RETICCTPCT  --  1.7   Microbiology Recent Results (from the past 240 hour(s))  Urine culture     Status: Abnormal   Collection Time: 07/07/20 11:01 AM   Specimen: Urine, Random  Result Value Ref  Range Status   Specimen Description   Final    URINE, RANDOM Performed at [facility], [place] [address], [place], [place] [address]    Special Requests   Final    NONE Performed at [facility], [place] [address] [place], [place] [address]    Culture (A)  Final    40,000 COLONIES/mL ESCHERICHIA COLI Confirmed Extended Spectrum Beta-Lactamase Producer (ESBL).  In bloodstream infections from ESBL organisms, carbapenems are preferred over piperacillin/tazobactam. They are shown to have a lower risk of mortality.    Report Status 07/09/2020 FINAL  Final   Organism ID, Bacteria ESCHERICHIA COLI (A)  Final      Susceptibility   Escherichia coli - MIC*    AMPICILLIN >=32 RESISTANT Resistant     CEFAZOLIN >=64 RESISTANT Resistant     CEFEPIME 8 INTERMEDIATE Intermediate     CEFTRIAXONE >=64 RESISTANT Resistant     CIPROFLOXACIN 1 SENSITIVE Sensitive     GENTAMICIN >=16 RESISTANT Resistant     IMIPENEM <=0.25 SENSITIVE Sensitive     NITROFURANTOIN 64 INTERMEDIATE Intermediate     TRIMETH/SULFA >=320 RESISTANT Resistant     AMPICILLIN/SULBACTAM >=32 RESISTANT Resistant     PIP/TAZO <=4 SENSITIVE Sensitive     * 40,000  COLONIES/mL ESCHERICHIA COLI  Resp Panel by RT-PCR (Flu A&B, Covid) Nasopharyngeal Swab     Status: None   Collection Time: 07/07/20 12:03 PM   Specimen: Nasopharyngeal Swab; Nasopharyngeal(NP) swabs in vial transport medium  Result Value Ref Range Status   SARS Coronavirus 2 by RT PCR NEGATIVE NEGATIVE Final    Comment: (NOTE) SARS-CoV-2 target nucleic acids are NOT DETECTED.  The SARS-CoV-2 RNA is generally detectable in upper respiratory specimens during the acute phase of infection. The lowest concentration of SARS-CoV-2 viral copies this assay can detect is 138 copies/mL. A negative result does not preclude SARS-Cov-2 infection and should not be used as the sole basis for treatment or other patient management decisions. A negative result may occur with  improper specimen collection/handling, submission of specimen other than nasopharyngeal swab, presence of viral mutation(s) within the areas targeted by this assay, and inadequate number of viral copies(<138 copies/mL). A negative result must be combined with clinical observations, patient history, and epidemiological information. The expected result is Negative.  Fact Sheet for Patients:  EntrepreneurPulse.com.au  Fact Sheet for Healthcare Providers:  IncredibleEmployment.be  This test is no t yet approved or cleared by the [place] FDA and  has been authorized for detection and/or diagnosis of SARS-CoV-2 by FDA under an Emergency Use Authorization (EUA). This EUA will remain  in effect (meaning this test can be used) for the duration of the COVID-19 declaration under Section 564(b)(1) of the Act, 21 U.S.C.section 360bbb-3(b)(1), unless the authorization is terminated  or revoked sooner.       Influenza A by PCR NEGATIVE NEGATIVE Final   Influenza B by PCR NEGATIVE NEGATIVE Final    Comment: (NOTE) The Xpert Xpress SARS-CoV-2/FLU/RSV plus assay is intended as an aid in the  diagnosis of influenza from Nasopharyngeal swab specimens and should not be used as a sole basis for treatment. Nasal washings and aspirates are unacceptable for Xpert Xpress SARS-CoV-2/FLU/RSV testing.  Fact Sheet for Patients: EntrepreneurPulse.com.au  Fact Sheet for Healthcare Providers: IncredibleEmployment.be  This test is not yet approved or cleared by the [place] FDA and has been authorized for detection and/or diagnosis of SARS-CoV-2 by FDA under an Emergency Use Authorization (EUA). This EUA will remain  in effect (meaning this test can be used) for the duration of the COVID-19 declaration under Section 564(b)(1) of the Act, 21 U.S.C. section 360bbb-3(b)(1), unless the authorization is terminated or revoked.  Performed at [facility], [place] [address], [place], [place] [address]   Blood Culture (routine x 2)     Status: None (Preliminary result)   Collection Time: 07/07/20 12:03 PM   Specimen: BLOOD  Result Value Ref Range Status   Specimen Description   Final    BLOOD LEFT ANTECUBITAL Performed at [place] [address], [place], [place] [address]    Special Requests   Final    BOTTLES DRAWN AEROBIC AND ANAEROBIC Blood Culture adequate volume Performed at [place] [address], [place], [place] [address]    Culture   Final    NO GROWTH 3 DAYS Performed at [facility], [place] [address], [place], [place] [address]    Report Status PENDING  Incomplete  Blood Culture (routine x 2)     Status: None (Preliminary result)   Collection Time: 07/07/20 12:08 PM   Specimen: BLOOD  Result Value Ref Range Status   Specimen Description   Final    BLOOD RIGHT ANTECUBITAL Performed at [place] [address], [place], [place] [address]    Special Requests   Final    BOTTLES DRAWN AEROBIC AND ANAEROBIC Blood Culture adequate  volume Performed at [place] [address], [place], [place] [address]    Culture   Final    NO GROWTH 3 DAYS Performed at [facility], [place] [address] [place], [place] [address]    Report Status PENDING  Incomplete     Discharge Instructions:   Discharge Instructions     Call MD for:  persistant nausea and vomiting   Complete by: As directed    Call MD for:  severe uncontrolled pain   Complete by: As directed    Call MD for:  temperature >100.4   Complete by: As directed    Diet Carb Modified   Complete by: As directed    Discharge instructions   Complete by: As directed    Follow-up with your primary care provider in 1 week.  Complete course of antibiotic.  Coordinate with your urologist and primary care physician to discuss about recurrent urinary tract infection and how to prevent them.   Increase activity slowly   Complete by: As directed       Allergies as of 07/10/2020       Reactions   Latex Rash   Penicillins Rash   Has patient had a PCN reaction causing immediate rash, facial/tongue/throat swelling, SOB or lightheadedness with hypotension: No Has patient had a PCN reaction causing severe rash involving mucus membranes or skin necrosis: No Has patient had a PCN reaction that required hospitalization: No Has patient had a PCN reaction occurring within the last 10 years: No If all of the above answers are "NO", then may proceed with Cephalosporin use.        Medication List     TAKE these medications    albuterol 108 (90 Base) MCG/ACT inhaler Commonly known as: ProAir HFA Inhale 2 puffs into the lungs every 6 (six) hours as needed for wheezing or shortness of breath.   amitriptyline 10 MG tablet Commonly known as: ELAVIL TAKE 1 TABLET BY MOUTH EVERYDAY AT BEDTIME What changed:   how much to take  how to take this  when to take this  additional instructions   baclofen 10 MG tablet Commonly known as:  LIORESAL TAKE 0.5-1 TABLET BY MOUTH TWICE DAILY AS NEEDED FOR MUSCLE SPASMS OR PAIN What changed: See the new instructions.   BD Pen Needle Nano 2nd Gen 32G X 4 MM Misc Generic drug: Insulin Pen Needle USE AS DIRECTED 4 TIMES A DAY   busPIRone 5 MG tablet Commonly known as: BUSPAR TAKE 1 TO 2 TABLETS BY MOUTH TWICE A DAY What changed: See the new instructions.   calcium carbonate 1500 (600 Ca) MG Tabs tablet Commonly known as: OSCAL Take 600 mg of elemental calcium by mouth 2 (two) times daily with a meal.   cholecalciferol 25 MCG (1000 UNIT) tablet Commonly known as: VITAMIN D3 Take 1,000 Units by mouth daily.   ciprofloxacin 500 MG tablet Commonly known as: Cipro Take 1 tablet (500 mg total) by mouth 2 (two) times daily for 3 days.   clotrimazole-betamethasone cream Commonly known as: LOTRISONE APPLY TO AFFECTED AREA TWICE A DAY   DULoxetine 30 MG capsule Commonly known as: CYMBALTA Take 1 capsule (30 mg total) by mouth daily. What changed: additional instructions   DULoxetine 60 MG capsule Commonly known as: CYMBALTA Take 1 capsule (60 mg total) by mouth daily. What changed: additional instructions   estradiol 0.1 MG/GM vaginal cream Commonly known as: ESTRACE Place 1 Applicatorful vaginally See admin instructions. Takes 2 times a week   feeding supplement Liqd Take 237 mLs by mouth 2 (two) times daily between meals. What changed:   when to take this  reasons to take this   freestyle lancets Use to test blood sugar 3 times daily. Dx: E11.65   FREESTYLE LITE test strip Generic drug: glucose blood USE TO TEST BLOOD SUGAR 3 TIMES DAILY. DX: E11.65   guaiFENesin 100 MG/5ML liquid Commonly known as: ROBITUSSIN Take 200 mg by mouth at bedtime.   HYDROcodone-acetaminophen 10-325 MG tablet Commonly known as: NORCO Take 1 tablet by mouth every 4 (four) hours as needed for severe pain.   ipratropium-albuterol 0.5-2.5 (3) MG/3ML Soln Commonly known as:  DUONEB Inhale 3 mLs into the lungs 3 (three) times daily as needed (shortness of breath). USE 1 VIAL IN NEBULIZER 3 TIMES DAILY What changed:   when to take this  additional instructions   Lantus SoloStar 100 UNIT/ML Solostar Pen Generic drug: insulin glargine Inject 8 Units into the skin daily. What changed: how much to take   levothyroxine 50 MCG tablet Commonly known as: SYNTHROID TAKE 1 TABLET BY MOUTH EVERY DAY BEFORE BREAKFAST What changed: See the new instructions.   Lidocaine 4 % Ptch Apply 1 patch topically 2 (two) times daily as needed (pain). What changed: Another medication with the same name was changed. Make sure you understand how and when to take each.   lidocaine 5 % ointment Commonly known as: XYLOCAINE Apply 1 application topically as needed. Apply to external vulvar area What changed:   when to take this  reasons to take this  additional instructions   magnesium oxide 400 MG tablet Commonly known as: MAG-OX TAKE 1 TABLET BY MOUTH EVERY DAY   methenamine 1 g tablet Commonly known as: MANDELAMINE Take 1,000 mg by mouth 2 (two) times daily.   montelukast 10 MG tablet Commonly known as: SINGULAIR TAKE 1 TABLET BY MOUTH EVERY DAY What changed: when to take this   MUCINEX D MAX STRENGTH PO Take 1,200 mg by mouth in the morning  and at bedtime.   multivitamin with minerals Tabs tablet Take 1 tablet by mouth daily. Centrum   naloxone 4 MG/0.1ML Liqd nasal spray kit Commonly known as: NARCAN Place 1 spray into the nose once as needed (overdose).   omeprazole 20 MG capsule Commonly known as: PRILOSEC TAKE 1 CAPSULE BY MOUTH 2 TIMES A DAY BEFORE A MEAL What changed: See the new instructions.   polyethylene glycol 17 g packet Commonly known as: MIRALAX / GLYCOLAX Take 17 g by mouth daily.   predniSONE 1 MG tablet Commonly known as: DELTASONE Take 2 tablets (2 mg total) by mouth daily. What changed:   how much to take  when to take this    PREVAGEN PO Take 1 tablet by mouth daily.   rosuvastatin 10 MG tablet Commonly known as: CRESTOR Take 1 tablet (10 mg total) by mouth every evening.   TART CHERRY ADVANCED PO Take 1 tablet by mouth daily.   Trelegy Ellipta 100-62.5-25 MCG/INH Aepb Generic drug: Fluticasone-Umeclidin-Vilant INHALE 1 PUFF INTO THE LUNGS DAILY. RINSE MOUTH What changed: additional instructions   TURMERIC PO Take 1 tablet by mouth daily.   VITAMIN B 12 PO Take 2,500 mg by mouth See admin instructions. Takes 2 times a week - Tuesdays and Thursdays   vitamin C 1000 MG tablet Take 1,000 mg by mouth 2 (two) times daily.        Follow-up Information     [name], MD. Schedule an appointment as soon as possible for a visit in 1 week(s).   Specialty: Internal Medicine Contact information: [place] [address] [phone]         Werner Lean, MD .   Specialty: Cardiology Contact information: [place] [address] [phone]                  Time coordinating discharge: 39 minutes  Signed:  Talha Iser  [facility] 07/10/2020, 10:15 AM

## 2020-07-10 NOTE — TOC Transition Note (Signed)
Transition of Care [facility]) - CM/SW Discharge Note   Patient Details  Name: Kayla Thompson MRN: [id number] Date of Birth: April 07, 1935  [facility] CM/SW Contact:  Trish Mage, LCSW Phone Number: 07/10/2020, 12:03 PM   Clinical Narrative:   Patient who is stable for discharge will return home, has a ride.  Order for [facility] PT seen and appreciated.  [facility] to alert them to discharge.  No further needs identified.  TOC sign off.    Final next level of care: [place] Barriers to Discharge: No Barriers Identified   Patient Goals and CMS Choice        Discharge Placement                       Discharge Plan and Services                                     Social Determinants of Health (SDOH) Interventions     Readmission Risk Interventions Readmission Risk Prevention Plan 05/09/2020  Transportation Screening Complete  PCP or Specialist Appt within 5-7 Days Complete  Home Care Screening Complete  Some recent data might be hidden

## 2020-07-10 NOTE — Progress Notes (Signed)
Pt discharged at this time. Cell phone, charger, blanket, and slippers sent with pt and handed to pts daughter, [name]. Both pt and daughter voice full understanding of DC instructions.

## 2020-07-11 ENCOUNTER — Telehealth: Payer: Self-pay

## 2020-07-11 NOTE — Telephone Encounter (Signed)
Transition Care Management Follow-up Telephone Call  Date of discharge and from where: 07/10/2020 from [facility]  How have you been since you were released from the hospital? Feeling well  Any questions or concerns? No  Items Reviewed:  Did the pt receive and understand the discharge instructions provided? Yes   Medications obtained and verified? Yes   Other? No   Any new allergies since your discharge? No   Dietary orders reviewed? Yes, carb modified diet  Do you have support at home? Yes   Home Care and Equipment/Supplies: Were home health services ordered? no If so, what is the name of the agency? n/a  Has the agency set up a time to come to the patient's home? not applicable Were any new equipment or medical supplies ordered?  No What is the name of the medical supply agency? n/a Were you able to get the supplies/equipment? not applicable Do you have any questions related to the use of the equipment or supplies? No  Functional Questionnaire: (I = Independent and D = Dependent) ADLs: I  Bathing/Dressing- I  Meal Prep- I  Eating- I  Maintaining continence- I  Transferring/Ambulation- I, using a walker  Managing Meds- I  Follow up appointments reviewed:   PCP Hospital f/u appt confirmed? Yes  Scheduled to see Pricilla Holm, MD on 07/15/2020 @ 4:00 pm.  [facility] f/u appt confirmed? No    Are transportation arrangements needed? No   If their condition worsens, is the pt aware to call PCP or go to the Emergency Dept.? Yes  Was the patient provided with contact information for the PCP's office or ED? Yes  Was to pt encouraged to call back with questions or concerns? Yes

## 2020-07-11 NOTE — Telephone Encounter (Signed)
I called and patient daughter picked up Kayla Thompson( had DPR access) patient was in hospital and now home resting. Kayla Thompson said they will call back to schedule U/S at later date. I give her office # to call.  Just FYI

## 2020-07-12 ENCOUNTER — Ambulatory Visit (INDEPENDENT_AMBULATORY_CARE_PROVIDER_SITE_OTHER): Payer: Medicare Other | Admitting: Podiatry

## 2020-07-12 ENCOUNTER — Other Ambulatory Visit: Payer: Self-pay

## 2020-07-12 DIAGNOSIS — M79675 Pain in left toe(s): Secondary | ICD-10-CM

## 2020-07-12 DIAGNOSIS — M79674 Pain in right toe(s): Secondary | ICD-10-CM

## 2020-07-12 DIAGNOSIS — E1149 Type 2 diabetes mellitus with other diabetic neurological complication: Secondary | ICD-10-CM

## 2020-07-12 DIAGNOSIS — B351 Tinea unguium: Secondary | ICD-10-CM

## 2020-07-12 DIAGNOSIS — L84 Corns and callosities: Secondary | ICD-10-CM

## 2020-07-12 LAB — CULTURE, BLOOD (ROUTINE X 2)
Culture: NO GROWTH
Culture: NO GROWTH
Special Requests: ADEQUATE
Special Requests: ADEQUATE

## 2020-07-12 NOTE — Progress Notes (Signed)
Subjective: 85 year old female presents to the office today for follow-up evaluation of right second toe pre-ulcerative callus as well as thick, elongated toenails that she cannot trim her self. No increase in swelling. No open lesions or drainage she reports. has no new concerns today. Denies any fevers, chills, nausea, vomiting.  No calf pain, chest pain, shortness of breath.  Objective: AAO x3, NAD-presents with caregiver DP/PT pulses palpable bilaterally, CRT less than 3 seconds Rigid hammertoe contracture present of the right second toe resulting in a mild hyperkeratotic lesion on the dorsal PIPJ.  After debridement there is no underlying ulceration drainage or any signs of infection.  Mild chronic edema to the toe without any fluctuation crepitation.  There is no malodor.  The toe appears to be stable without any changes. Nails are hypertrophic, dystrophic, brittle, discolored, elongated 10. No surrounding redness or drainage. Tenderness nails 1-5 bilaterally. No pain with calf compression, swelling, warmth, erythema  Assessment: Right second toe preulcerative callus; symptomatic onychomycosis  Plan: -All treatment options discussed with the patient including all alternatives, risks, complications.  -Debrided hyperkeratotic tissue with any complications or bleeding.  Continue offloading and monitor closely for any skin breakdown or recurrence of infection.  Dispensed further offloading pads today. -Debrided the nails x10 without any complications or bleeding.   Return in about 3 months  Trula Slade DPM

## 2020-07-13 NOTE — Telephone Encounter (Signed)
Thank you for the update!

## 2020-07-15 ENCOUNTER — Other Ambulatory Visit: Payer: Self-pay

## 2020-07-15 ENCOUNTER — Encounter: Payer: Self-pay | Admitting: Internal Medicine

## 2020-07-15 ENCOUNTER — Ambulatory Visit (INDEPENDENT_AMBULATORY_CARE_PROVIDER_SITE_OTHER): Payer: Medicare Other | Admitting: Internal Medicine

## 2020-07-15 VITALS — BP 130/88 | HR 111 | Temp 98.2°F | Ht 59.0 in | Wt 116.4 lb

## 2020-07-15 DIAGNOSIS — G9341 Metabolic encephalopathy: Secondary | ICD-10-CM | POA: Diagnosis not present

## 2020-07-15 DIAGNOSIS — N390 Urinary tract infection, site not specified: Secondary | ICD-10-CM

## 2020-07-15 DIAGNOSIS — J479 Bronchiectasis, uncomplicated: Secondary | ICD-10-CM | POA: Diagnosis not present

## 2020-07-15 DIAGNOSIS — R399 Unspecified symptoms and signs involving the genitourinary system: Secondary | ICD-10-CM

## 2020-07-15 LAB — COMPREHENSIVE METABOLIC PANEL
ALT: 19 U/L (ref 0–35)
AST: 12 U/L (ref 0–37)
Albumin: 3.4 g/dL — ABNORMAL LOW (ref 3.5–5.2)
Alkaline Phosphatase: 34 U/L — ABNORMAL LOW (ref 39–117)
BUN: 21 mg/dL (ref 6–23)
CO2: 20 mEq/L (ref 19–32)
Calcium: 8.8 mg/dL (ref 8.4–10.5)
Chloride: 104 mEq/L (ref 96–112)
Creatinine, Ser: 0.96 mg/dL (ref 0.40–1.20)
GFR: 54.88 mL/min — ABNORMAL LOW (ref >60.00)
Glucose, Bld: 160 mg/dL — ABNORMAL HIGH (ref 70–99)
Potassium: 4.3 mEq/L (ref 3.5–5.1)
Sodium: 137 mEq/L (ref 135–145)
Total Bilirubin: 0.3 mg/dL (ref 0.2–1.2)
Total Protein: 6.3 g/dL (ref 6.0–8.3)

## 2020-07-15 LAB — CBC
HCT: 33 % — ABNORMAL LOW (ref 36.0–46.0)
Hemoglobin: 11 g/dL — ABNORMAL LOW (ref 12.0–15.0)
MCHC: 33.2 g/dL (ref 30.0–36.0)
MCV: 111.5 fl — ABNORMAL HIGH (ref 78.0–100.0)
Platelets: 419 10*3/uL — ABNORMAL HIGH (ref 150.0–400.0)
RBC: 2.96 Mil/uL — ABNORMAL LOW (ref 3.87–5.11)
RDW: 14.5 % (ref 11.5–15.5)
WBC: 17.9 10*3/uL — ABNORMAL HIGH (ref 4.0–10.5)

## 2020-07-15 MED ORDER — FLUCONAZOLE 150 MG PO TABS: 150.0000 mg | ORAL_TABLET | ORAL | 0 refills | Status: AC

## 2020-07-15 NOTE — Patient Instructions (Signed)
We will check the blood work today and the urine.

## 2020-07-15 NOTE — Progress Notes (Signed)
   Subjective:   Patient ID: Kayla Thompson, female    DOB: 1935-06-21, 85 y.o.   MRN: [id number]  HPI The patient is an 85 YO female coming in for hospital follow up (in for encephalopathy due to sepsis with UTI, treated with IV antibiotics due to multiple resistances). She is still slightly confused since leaving hospital. Daughter is on phone to help with history. She is concerned about more frequent episodes and feels that the urologist is unable to prevent this. She is taking a preventative treatment and they are concerned about the yeast in her urine. Daughter is unsure if her mother took the diflucan treatment. She would like refill on that in case they will take again. Denies fevers or chills. Does have chronic pain on urination which it is unclear if is different.   Review of Systems  Constitutional: Positive for activity change, appetite change and fatigue.  HENT: Negative.   Eyes: Negative.   Respiratory: Positive for shortness of breath. Negative for cough and chest tightness.   Cardiovascular: Negative for chest pain, palpitations and leg swelling.  Gastrointestinal: Negative for abdominal distention, abdominal pain, constipation, diarrhea, nausea and vomiting.  Genitourinary: Positive for dysuria.  Musculoskeletal: Negative.   Skin: Negative.   Neurological: Negative.   Psychiatric/Behavioral: Positive for confusion and decreased concentration.    Objective:  Physical Exam Constitutional:      Appearance: She is well-developed.     Comments: Chronically ill appearing  HENT:     Head: Normocephalic and atraumatic.  Cardiovascular:     Rate and Rhythm: Normal rate and regular rhythm.  Pulmonary:     Effort: Pulmonary effort is normal. No respiratory distress.     Breath sounds: Normal breath sounds. No wheezing or rales.  Abdominal:     General: Bowel sounds are normal. There is no distension.     Palpations: Abdomen is soft.     Tenderness: There is no abdominal  tenderness. There is no rebound.  Musculoskeletal:     Cervical back: Normal range of motion.  Skin:    General: Skin is warm and dry.  Neurological:     Mental Status: She is alert and oriented to person, place, and time.     Coordination: Coordination normal.     Comments: Is able to tell me what is going on and consistent timeline, some details are confused     Vitals:   07/15/20 1501  BP: 130/88  Pulse: (!) 111  Temp: 98.2 F (36.8 C)  TempSrc: Oral  SpO2: (!) 86%  Weight: 116 lb 6.4 oz (52.8 kg)  Height: 4\' 11"  (1.499 m)    This visit occurred during the SARS-CoV-2 public health emergency.  Safety protocols were in place, including screening questions prior to the visit, additional usage of staff PPE, and extensive cleaning of exam room while observing appropriate contact time as indicated for disinfecting solutions.   Assessment & Plan:

## 2020-07-16 DIAGNOSIS — J479 Bronchiectasis, uncomplicated: Secondary | ICD-10-CM | POA: Diagnosis not present

## 2020-07-16 DIAGNOSIS — G9341 Metabolic encephalopathy: Secondary | ICD-10-CM | POA: Diagnosis not present

## 2020-07-16 DIAGNOSIS — E1169 Type 2 diabetes mellitus with other specified complication: Secondary | ICD-10-CM | POA: Diagnosis not present

## 2020-07-16 DIAGNOSIS — I5032 Chronic diastolic (congestive) heart failure: Secondary | ICD-10-CM | POA: Diagnosis not present

## 2020-07-16 DIAGNOSIS — E43 Unspecified severe protein-calorie malnutrition: Secondary | ICD-10-CM | POA: Diagnosis not present

## 2020-07-16 DIAGNOSIS — E1151 Type 2 diabetes mellitus with diabetic peripheral angiopathy without gangrene: Secondary | ICD-10-CM | POA: Diagnosis not present

## 2020-07-16 DIAGNOSIS — Z9181 History of falling: Secondary | ICD-10-CM | POA: Diagnosis not present

## 2020-07-16 DIAGNOSIS — Z602 Problems related to living alone: Secondary | ICD-10-CM | POA: Diagnosis not present

## 2020-07-16 DIAGNOSIS — E1159 Type 2 diabetes mellitus with other circulatory complications: Secondary | ICD-10-CM | POA: Diagnosis not present

## 2020-07-16 DIAGNOSIS — I152 Hypertension secondary to endocrine disorders: Secondary | ICD-10-CM | POA: Diagnosis not present

## 2020-07-16 DIAGNOSIS — N390 Urinary tract infection, site not specified: Secondary | ICD-10-CM | POA: Diagnosis not present

## 2020-07-16 DIAGNOSIS — F32A Depression, unspecified: Secondary | ICD-10-CM | POA: Diagnosis not present

## 2020-07-16 DIAGNOSIS — Z1612 Extended spectrum beta lactamase (ESBL) resistance: Secondary | ICD-10-CM | POA: Diagnosis not present

## 2020-07-16 DIAGNOSIS — E785 Hyperlipidemia, unspecified: Secondary | ICD-10-CM | POA: Diagnosis not present

## 2020-07-16 DIAGNOSIS — B9620 Unspecified Escherichia coli [E. coli] as the cause of diseases classified elsewhere: Secondary | ICD-10-CM | POA: Diagnosis not present

## 2020-07-16 DIAGNOSIS — F411 Generalized anxiety disorder: Secondary | ICD-10-CM | POA: Diagnosis not present

## 2020-07-16 DIAGNOSIS — J45909 Unspecified asthma, uncomplicated: Secondary | ICD-10-CM | POA: Diagnosis not present

## 2020-07-17 ENCOUNTER — Telehealth: Payer: Self-pay | Admitting: Internal Medicine

## 2020-07-17 DIAGNOSIS — N390 Urinary tract infection, site not specified: Secondary | ICD-10-CM | POA: Diagnosis not present

## 2020-07-17 LAB — URINALYSIS, ROUTINE W REFLEX MICROSCOPIC
Bilirubin Urine: NEGATIVE
Ketones, ur: NEGATIVE
Nitrite: NEGATIVE
Specific Gravity, Urine: 1.025 (ref 1.000–1.030)
Total Protein, Urine: 100 — AB
Urine Glucose: NEGATIVE
Urobilinogen, UA: 0.2 (ref 0.0–1.0)
pH: 6 (ref 5.0–8.0)

## 2020-07-17 NOTE — Telephone Encounter (Signed)
See below

## 2020-07-17 NOTE — Telephone Encounter (Signed)
Dr. Sharlet Salina has not interpreted the results. Patient's granddaughter is not listed on the a dpr. I will be unable to speak with her.

## 2020-07-17 NOTE — Telephone Encounter (Signed)
Ok for verbals 

## 2020-07-17 NOTE — Telephone Encounter (Signed)
Liji with [facility] calling, requesting verbal orders for [facility] PT for 1 w 1, 2 w 4, 1 w 3 for gait balance and strengthening. Liji- [phone] [facility]

## 2020-07-17 NOTE — Telephone Encounter (Signed)
Patients granddaughter called and is requesting a call back in regards to recent lab results. She can be reached at [phone]. Please advise

## 2020-07-18 LAB — URINE CULTURE

## 2020-07-18 NOTE — Assessment & Plan Note (Signed)
Some low oxygen level with walking today which did increase with resting.

## 2020-07-18 NOTE — Assessment & Plan Note (Signed)
Rx diflucan for prior yeast and checking U/A and culture.

## 2020-07-18 NOTE — Assessment & Plan Note (Signed)
Checking U/A and culture.

## 2020-07-18 NOTE — Assessment & Plan Note (Signed)
Still a little confused today but fairly clear mentation. Talked with daughter that the more episodes like this she does have the more change that some confusion could be permanent.

## 2020-07-21 ENCOUNTER — Ambulatory Visit: Payer: Medicare Other | Admitting: Podiatry

## 2020-07-22 DIAGNOSIS — G9341 Metabolic encephalopathy: Secondary | ICD-10-CM | POA: Diagnosis not present

## 2020-07-22 DIAGNOSIS — N390 Urinary tract infection, site not specified: Secondary | ICD-10-CM | POA: Diagnosis not present

## 2020-07-22 DIAGNOSIS — E1159 Type 2 diabetes mellitus with other circulatory complications: Secondary | ICD-10-CM | POA: Diagnosis not present

## 2020-07-22 DIAGNOSIS — I152 Hypertension secondary to endocrine disorders: Secondary | ICD-10-CM | POA: Diagnosis not present

## 2020-07-22 DIAGNOSIS — Z1612 Extended spectrum beta lactamase (ESBL) resistance: Secondary | ICD-10-CM | POA: Diagnosis not present

## 2020-07-22 DIAGNOSIS — B9620 Unspecified Escherichia coli [E. coli] as the cause of diseases classified elsewhere: Secondary | ICD-10-CM | POA: Diagnosis not present

## 2020-07-24 DIAGNOSIS — G9341 Metabolic encephalopathy: Secondary | ICD-10-CM | POA: Diagnosis not present

## 2020-07-24 DIAGNOSIS — I152 Hypertension secondary to endocrine disorders: Secondary | ICD-10-CM | POA: Diagnosis not present

## 2020-07-24 DIAGNOSIS — N390 Urinary tract infection, site not specified: Secondary | ICD-10-CM | POA: Diagnosis not present

## 2020-07-24 DIAGNOSIS — B9620 Unspecified Escherichia coli [E. coli] as the cause of diseases classified elsewhere: Secondary | ICD-10-CM | POA: Diagnosis not present

## 2020-07-24 DIAGNOSIS — E1159 Type 2 diabetes mellitus with other circulatory complications: Secondary | ICD-10-CM | POA: Diagnosis not present

## 2020-07-24 DIAGNOSIS — Z1612 Extended spectrum beta lactamase (ESBL) resistance: Secondary | ICD-10-CM | POA: Diagnosis not present

## 2020-07-28 DIAGNOSIS — Z1612 Extended spectrum beta lactamase (ESBL) resistance: Secondary | ICD-10-CM | POA: Diagnosis not present

## 2020-07-28 DIAGNOSIS — N390 Urinary tract infection, site not specified: Secondary | ICD-10-CM | POA: Diagnosis not present

## 2020-07-28 DIAGNOSIS — B9620 Unspecified Escherichia coli [E. coli] as the cause of diseases classified elsewhere: Secondary | ICD-10-CM | POA: Diagnosis not present

## 2020-07-28 DIAGNOSIS — I152 Hypertension secondary to endocrine disorders: Secondary | ICD-10-CM | POA: Diagnosis not present

## 2020-07-28 DIAGNOSIS — G9341 Metabolic encephalopathy: Secondary | ICD-10-CM | POA: Diagnosis not present

## 2020-07-28 DIAGNOSIS — E1159 Type 2 diabetes mellitus with other circulatory complications: Secondary | ICD-10-CM | POA: Diagnosis not present

## 2020-07-30 ENCOUNTER — Emergency Department (HOSPITAL_COMMUNITY): Payer: Medicare Other

## 2020-07-30 ENCOUNTER — Encounter (HOSPITAL_COMMUNITY): Payer: Self-pay | Admitting: Emergency Medicine

## 2020-07-30 ENCOUNTER — Telehealth: Payer: Self-pay | Admitting: Internal Medicine

## 2020-07-30 ENCOUNTER — Other Ambulatory Visit: Payer: Self-pay

## 2020-07-30 ENCOUNTER — Inpatient Hospital Stay (HOSPITAL_COMMUNITY)
Admission: EM | Admit: 2020-07-30 | Discharge: 2020-08-04 | DRG: 689 | Disposition: A | Discharge status: Home Health | Payer: Medicare Other | Attending: Internal Medicine | Admitting: Internal Medicine

## 2020-07-30 ENCOUNTER — Other Ambulatory Visit: Payer: Self-pay | Admitting: Internal Medicine

## 2020-07-30 DIAGNOSIS — M1990 Unspecified osteoarthritis, unspecified site: Secondary | ICD-10-CM | POA: Diagnosis present

## 2020-07-30 DIAGNOSIS — Z880 Allergy status to penicillin: Secondary | ICD-10-CM | POA: Diagnosis not present

## 2020-07-30 DIAGNOSIS — Z801 Family history of malignant neoplasm of trachea, bronchus and lung: Secondary | ICD-10-CM | POA: Diagnosis not present

## 2020-07-30 DIAGNOSIS — Z7951 Long term (current) use of inhaled steroids: Secondary | ICD-10-CM

## 2020-07-30 DIAGNOSIS — F32A Depression, unspecified: Secondary | ICD-10-CM

## 2020-07-30 DIAGNOSIS — R4182 Altered mental status, unspecified: Principal | ICD-10-CM

## 2020-07-30 DIAGNOSIS — Z1612 Extended spectrum beta lactamase (ESBL) resistance: Secondary | ICD-10-CM | POA: Diagnosis present

## 2020-07-30 DIAGNOSIS — Z8543 Personal history of malignant neoplasm of ovary: Secondary | ICD-10-CM | POA: Diagnosis not present

## 2020-07-30 DIAGNOSIS — Z8249 Family history of ischemic heart disease and other diseases of the circulatory system: Secondary | ICD-10-CM

## 2020-07-30 DIAGNOSIS — F419 Anxiety disorder, unspecified: Secondary | ICD-10-CM | POA: Diagnosis present

## 2020-07-30 DIAGNOSIS — E039 Hypothyroidism, unspecified: Secondary | ICD-10-CM | POA: Diagnosis present

## 2020-07-30 DIAGNOSIS — Z79899 Other long term (current) drug therapy: Secondary | ICD-10-CM

## 2020-07-30 DIAGNOSIS — Z833 Family history of diabetes mellitus: Secondary | ICD-10-CM

## 2020-07-30 DIAGNOSIS — Z79890 Hormone replacement therapy (postmenopausal): Secondary | ICD-10-CM

## 2020-07-30 DIAGNOSIS — G3184 Mild cognitive impairment, so stated: Secondary | ICD-10-CM | POA: Diagnosis present

## 2020-07-30 DIAGNOSIS — Z20822 Contact with and (suspected) exposure to covid-19: Secondary | ICD-10-CM | POA: Diagnosis present

## 2020-07-30 DIAGNOSIS — G9341 Metabolic encephalopathy: Secondary | ICD-10-CM | POA: Diagnosis present

## 2020-07-30 DIAGNOSIS — E119 Type 2 diabetes mellitus without complications: Secondary | ICD-10-CM | POA: Diagnosis present

## 2020-07-30 DIAGNOSIS — E860 Dehydration: Secondary | ICD-10-CM | POA: Diagnosis present

## 2020-07-30 DIAGNOSIS — M797 Fibromyalgia: Secondary | ICD-10-CM | POA: Diagnosis present

## 2020-07-30 DIAGNOSIS — R8271 Bacteriuria: Secondary | ICD-10-CM | POA: Diagnosis not present

## 2020-07-30 DIAGNOSIS — Z794 Long term (current) use of insulin: Secondary | ICD-10-CM

## 2020-07-30 DIAGNOSIS — G8929 Other chronic pain: Secondary | ICD-10-CM

## 2020-07-30 DIAGNOSIS — K219 Gastro-esophageal reflux disease without esophagitis: Secondary | ICD-10-CM | POA: Diagnosis present

## 2020-07-30 DIAGNOSIS — I10 Essential (primary) hypertension: Secondary | ICD-10-CM | POA: Diagnosis present

## 2020-07-30 DIAGNOSIS — M810 Age-related osteoporosis without current pathological fracture: Secondary | ICD-10-CM | POA: Diagnosis present

## 2020-07-30 DIAGNOSIS — R410 Disorientation, unspecified: Secondary | ICD-10-CM | POA: Diagnosis not present

## 2020-07-30 DIAGNOSIS — Z91040 Latex allergy status: Secondary | ICD-10-CM

## 2020-07-30 DIAGNOSIS — B9629 Other Escherichia coli [E. coli] as the cause of diseases classified elsewhere: Secondary | ICD-10-CM | POA: Diagnosis present

## 2020-07-30 DIAGNOSIS — R531 Weakness: Secondary | ICD-10-CM | POA: Diagnosis not present

## 2020-07-30 DIAGNOSIS — Z66 Do not resuscitate: Secondary | ICD-10-CM | POA: Diagnosis present

## 2020-07-30 DIAGNOSIS — J449 Chronic obstructive pulmonary disease, unspecified: Secondary | ICD-10-CM | POA: Diagnosis present

## 2020-07-30 DIAGNOSIS — N3941 Urge incontinence: Secondary | ICD-10-CM | POA: Diagnosis not present

## 2020-07-30 DIAGNOSIS — N390 Urinary tract infection, site not specified: Principal | ICD-10-CM | POA: Diagnosis present

## 2020-07-30 LAB — COMPREHENSIVE METABOLIC PANEL
ALT: 13 U/L (ref 0–44)
AST: 16 U/L (ref 15–41)
Albumin: 3.5 g/dL (ref 3.5–5.0)
Alkaline Phosphatase: 24 U/L — ABNORMAL LOW (ref 38–126)
Anion gap: 11 (ref 5–15)
BUN: 24 mg/dL — ABNORMAL HIGH (ref 8–23)
CO2: 18 mmol/L — ABNORMAL LOW (ref 22–32)
Calcium: 9.2 mg/dL (ref 8.9–10.3)
Chloride: 107 mmol/L (ref 98–111)
Creatinine, Ser: 1.16 mg/dL — ABNORMAL HIGH (ref 0.44–1.00)
GFR, Estimated: 47 mL/min — ABNORMAL LOW (ref >60)
Glucose, Bld: 175 mg/dL — ABNORMAL HIGH (ref 70–99)
Potassium: 4.8 mmol/L (ref 3.5–5.1)
Sodium: 136 mmol/L (ref 135–145)
Total Bilirubin: 0.4 mg/dL (ref 0.3–1.2)
Total Protein: 6.4 g/dL — ABNORMAL LOW (ref 6.5–8.1)

## 2020-07-30 LAB — CBC
HCT: 35.2 % — ABNORMAL LOW (ref 36.0–46.0)
Hemoglobin: 11.2 g/dL — ABNORMAL LOW (ref 12.0–15.0)
MCH: 37.8 pg — ABNORMAL HIGH (ref 26.0–34.0)
MCHC: 31.8 g/dL (ref 30.0–36.0)
MCV: 118.9 fL — ABNORMAL HIGH (ref 80.0–100.0)
Platelets: 316 10*3/uL (ref 150–400)
RBC: 2.96 MIL/uL — ABNORMAL LOW (ref 3.87–5.11)
RDW: 14.5 % (ref 11.5–15.5)
WBC: 13.3 10*3/uL — ABNORMAL HIGH (ref 4.0–10.5)
nRBC: 0 % (ref 0.0–0.2)

## 2020-07-30 LAB — URINALYSIS, ROUTINE W REFLEX MICROSCOPIC
Bacteria, UA: NONE SEEN
Bilirubin Urine: NEGATIVE
Glucose, UA: 50 mg/dL — AB
Hgb urine dipstick: NEGATIVE
Ketones, ur: 5 mg/dL — AB
Nitrite: NEGATIVE
Protein, ur: 100 mg/dL — AB
Specific Gravity, Urine: 1.035 — ABNORMAL HIGH (ref 1.005–1.030)
WBC, UA: >50 WBC/hpf — ABNORMAL HIGH (ref 0–5)
pH: 5 (ref 5.0–8.0)

## 2020-07-30 LAB — RESP PANEL BY RT-PCR (FLU A&B, COVID) ARPGX2
Influenza A by PCR: NEGATIVE
Influenza B by PCR: NEGATIVE
SARS Coronavirus 2 by RT PCR: NEGATIVE

## 2020-07-30 MED ORDER — PREDNISONE 1 MG PO TABS
1.0000 mg | ORAL_TABLET | Freq: Two times a day (BID) | ORAL | Status: DC
  Administered 2020-07-31 – 2020-08-04 (×9): 1 mg via ORAL
  Filled 2020-07-30 (×13): qty 1

## 2020-07-30 MED ORDER — ENSURE ENLIVE PO LIQD
237.0000 mL | Freq: Two times a day (BID) | ORAL | Status: DC | PRN
  Filled 2020-07-30: qty 237

## 2020-07-30 MED ORDER — SODIUM CHLORIDE 0.9 % IV SOLN: 1.0000 g | Freq: Once | INTRAVENOUS | Status: DC

## 2020-07-30 MED ORDER — HYDROCODONE-ACETAMINOPHEN 10-325 MG PO TABS
1.0000 | ORAL_TABLET | ORAL | Status: DC
  Administered 2020-07-30 – 2020-08-04 (×25): 1 via ORAL
  Filled 2020-07-30 (×25): qty 1

## 2020-07-30 MED ORDER — LEVOTHYROXINE SODIUM 50 MCG PO TABS
50.0000 ug | ORAL_TABLET | Freq: Every day | ORAL | Status: DC
  Administered 2020-07-31 – 2020-08-04 (×5): 50 ug via ORAL
  Filled 2020-07-30 (×5): qty 1

## 2020-07-30 MED ORDER — PSEUDOEPHEDRINE-GUAIFENESIN ER 120-1200 MG PO TB12: 1200.0000 mg | ORAL_TABLET | Freq: Two times a day (BID) | ORAL | Status: DC

## 2020-07-30 MED ORDER — BACLOFEN 10 MG PO TABS
10.0000 mg | ORAL_TABLET | Freq: Every day | ORAL | Status: DC
  Administered 2020-07-31 – 2020-08-04 (×5): 10 mg via ORAL
  Filled 2020-07-30 (×5): qty 1

## 2020-07-30 MED ORDER — FLUTICASONE-UMECLIDIN-VILANT 100-62.5-25 MCG/INH IN AEPB: 1.0000 | INHALATION_SPRAY | Freq: Every day | RESPIRATORY_TRACT | Status: DC

## 2020-07-30 MED ORDER — MONTELUKAST SODIUM 10 MG PO TABS
10.0000 mg | ORAL_TABLET | Freq: Every day | ORAL | Status: DC
  Administered 2020-07-30 – 2020-08-03 (×5): 10 mg via ORAL
  Filled 2020-07-30 (×5): qty 1

## 2020-07-30 MED ORDER — ONDANSETRON HCL 4 MG PO TABS: 4.0000 mg | ORAL_TABLET | Freq: Four times a day (QID) | ORAL | Status: DC | PRN

## 2020-07-30 MED ORDER — DULOXETINE HCL 60 MG PO CPEP
90.0000 mg | ORAL_CAPSULE | Freq: Every day | ORAL | Status: DC
  Administered 2020-07-31 – 2020-08-04 (×5): 90 mg via ORAL
  Filled 2020-07-30 (×5): qty 1

## 2020-07-30 MED ORDER — FLUTICASONE FUROATE-VILANTEROL 100-25 MCG/INH IN AEPB
1.0000 | INHALATION_SPRAY | Freq: Every day | RESPIRATORY_TRACT | Status: DC
  Administered 2020-07-31 – 2020-08-04 (×5): 1 via RESPIRATORY_TRACT
  Filled 2020-07-30: qty 28

## 2020-07-30 MED ORDER — IPRATROPIUM-ALBUTEROL 0.5-2.5 (3) MG/3ML IN SOLN
3.0000 mL | Freq: Three times a day (TID) | RESPIRATORY_TRACT | Status: DC
  Administered 2020-07-30: 3 mL via RESPIRATORY_TRACT
  Filled 2020-07-30: qty 3

## 2020-07-30 MED ORDER — ESTRADIOL 0.1 MG/GM VA CREA
1.0000 | TOPICAL_CREAM | VAGINAL | Status: DC
  Administered 2020-07-31: 1 via VAGINAL
  Filled 2020-07-30: qty 42.5

## 2020-07-30 MED ORDER — ASCORBIC ACID 500 MG PO TABS
1000.0000 mg | ORAL_TABLET | Freq: Two times a day (BID) | ORAL | Status: DC
  Administered 2020-07-30 – 2020-08-04 (×10): 1000 mg via ORAL
  Filled 2020-07-30 (×10): qty 2

## 2020-07-30 MED ORDER — SODIUM CHLORIDE 0.9 % IV SOLN: 500.0000 mg | Freq: Three times a day (TID) | INTRAVENOUS | Status: DC

## 2020-07-30 MED ORDER — DULOXETINE HCL 30 MG PO CPEP: 30.0000 mg | ORAL_CAPSULE | Freq: Every day | ORAL | Status: DC

## 2020-07-30 MED ORDER — SODIUM CHLORIDE 0.9 % IV SOLN
1.0000 g | Freq: Two times a day (BID) | INTRAVENOUS | Status: DC
  Administered 2020-07-30 – 2020-08-04 (×10): 1 g via INTRAVENOUS
  Filled 2020-07-30 (×10): qty 1

## 2020-07-30 MED ORDER — ACETAMINOPHEN 650 MG RE SUPP: 650.0000 mg | Freq: Four times a day (QID) | RECTAL | Status: DC | PRN

## 2020-07-30 MED ORDER — UMECLIDINIUM BROMIDE 62.5 MCG/INH IN AEPB
1.0000 | INHALATION_SPRAY | Freq: Every day | RESPIRATORY_TRACT | Status: DC
  Administered 2020-07-31 – 2020-08-04 (×5): 1 via RESPIRATORY_TRACT
  Filled 2020-07-30: qty 7

## 2020-07-30 MED ORDER — BUSPIRONE HCL 10 MG PO TABS
5.0000 mg | ORAL_TABLET | Freq: Every day | ORAL | Status: DC | PRN
Start: 2020-07-30 — End: 2020-08-04

## 2020-07-30 MED ORDER — ADULT MULTIVITAMIN W/MINERALS CH
1.0000 | ORAL_TABLET | Freq: Every day | ORAL | Status: DC
  Administered 2020-07-31 – 2020-08-04 (×5): 1 via ORAL
  Filled 2020-07-30 (×5): qty 1

## 2020-07-30 MED ORDER — DULOXETINE HCL 30 MG PO CPEP: 60.0000 mg | ORAL_CAPSULE | Freq: Every day | ORAL | Status: DC

## 2020-07-30 MED ORDER — INSULIN GLARGINE 100 UNIT/ML ~~LOC~~ SOLN
8.0000 [IU] | Freq: Every day | SUBCUTANEOUS | Status: DC
  Administered 2020-07-31 – 2020-08-04 (×5): 8 [IU] via SUBCUTANEOUS
  Filled 2020-07-30 (×5): qty 0.08

## 2020-07-30 MED ORDER — POLYETHYLENE GLYCOL 3350 17 G PO PACK
17.0000 g | PACK | Freq: Every day | ORAL | Status: DC
  Administered 2020-07-30 – 2020-08-04 (×5): 17 g via ORAL
  Filled 2020-07-30 (×6): qty 1

## 2020-07-30 MED ORDER — LIDOCAINE 5 % EX PTCH
1.0000 | MEDICATED_PATCH | Freq: Every day | CUTANEOUS | Status: DC | PRN
  Administered 2020-07-31: 1 via TRANSDERMAL
  Filled 2020-07-30 (×3): qty 1

## 2020-07-30 MED ORDER — LIDOCAINE 5 % EX OINT
1.0000 "application " | TOPICAL_OINTMENT | Freq: Every day | CUTANEOUS | Status: DC | PRN
  Filled 2020-07-30 (×2): qty 35.44

## 2020-07-30 MED ORDER — ROSUVASTATIN CALCIUM 10 MG PO TABS
10.0000 mg | ORAL_TABLET | Freq: Every evening | ORAL | Status: DC
  Administered 2020-07-30 – 2020-08-03 (×5): 10 mg via ORAL
  Filled 2020-07-30 (×5): qty 1

## 2020-07-30 MED ORDER — PSEUDOEPHEDRINE HCL ER 120 MG PO TB12
120.0000 mg | ORAL_TABLET | Freq: Two times a day (BID) | ORAL | Status: DC
  Administered 2020-07-30 – 2020-08-04 (×10): 120 mg via ORAL
  Filled 2020-07-30 (×10): qty 1

## 2020-07-30 MED ORDER — PANTOPRAZOLE SODIUM 40 MG PO TBEC
40.0000 mg | DELAYED_RELEASE_TABLET | Freq: Two times a day (BID) | ORAL | Status: DC
  Administered 2020-07-30 – 2020-08-04 (×10): 40 mg via ORAL
  Filled 2020-07-30 (×10): qty 1

## 2020-07-30 MED ORDER — ONDANSETRON HCL 4 MG/2ML IJ SOLN: 4.0000 mg | Freq: Four times a day (QID) | INTRAMUSCULAR | Status: DC | PRN

## 2020-07-30 MED ORDER — IPRATROPIUM-ALBUTEROL 0.5-2.5 (3) MG/3ML IN SOLN
3.0000 mL | Freq: Three times a day (TID) | RESPIRATORY_TRACT | Status: DC | PRN
  Administered 2020-07-31: 3 mL via RESPIRATORY_TRACT
  Filled 2020-07-30: qty 3

## 2020-07-30 MED ORDER — ACETAMINOPHEN 325 MG PO TABS
650.0000 mg | ORAL_TABLET | Freq: Four times a day (QID) | ORAL | Status: DC | PRN
  Administered 2020-08-01: 650 mg via ORAL
  Administered 2020-08-02 (×2): 325 mg via ORAL
  Filled 2020-07-30 (×3): qty 2

## 2020-07-30 MED ORDER — ALBUTEROL SULFATE HFA 108 (90 BASE) MCG/ACT IN AERS: 2.0000 | INHALATION_SPRAY | Freq: Four times a day (QID) | RESPIRATORY_TRACT | Status: DC | PRN

## 2020-07-30 MED ORDER — ENOXAPARIN SODIUM 30 MG/0.3ML IJ SOSY
30.0000 mg | PREFILLED_SYRINGE | INTRAMUSCULAR | Status: DC
  Administered 2020-07-30 – 2020-08-03 (×5): 30 mg via SUBCUTANEOUS
  Filled 2020-07-30 (×5): qty 0.3

## 2020-07-30 MED ORDER — AMITRIPTYLINE HCL 10 MG PO TABS
10.0000 mg | ORAL_TABLET | Freq: Every day | ORAL | Status: DC
  Administered 2020-07-30 – 2020-08-03 (×5): 10 mg via ORAL
  Filled 2020-07-30 (×5): qty 1

## 2020-07-30 MED ORDER — GUAIFENESIN ER 600 MG PO TB12
1200.0000 mg | ORAL_TABLET | Freq: Two times a day (BID) | ORAL | Status: DC
  Administered 2020-07-31 – 2020-08-04 (×10): 1200 mg via ORAL
  Filled 2020-07-30 (×10): qty 2

## 2020-07-30 MED ORDER — MAGNESIUM OXIDE -MG SUPPLEMENT 400 (240 MG) MG PO TABS
400.0000 mg | ORAL_TABLET | Freq: Every day | ORAL | Status: DC
  Administered 2020-07-30 – 2020-08-03 (×5): 400 mg via ORAL
  Filled 2020-07-30 (×5): qty 1

## 2020-07-30 NOTE — Telephone Encounter (Signed)
predniSONE (DELTASONE) 1 MG tabletpatients daughter calling, wondering if the patient can get this medication in a 60 or 90 day supply

## 2020-07-30 NOTE — H&P (Signed)
History and Physical    Kayla Thompson HWE:[id number] DOB: [birth year] DOA: 07/30/2020  PCP: [name], MD  Patient coming from: Home  I have personally briefly reviewed patient's old medical records in [place]  Chief Complaint: AMS, UTI  HPI: Kayla Thompson is a 85 y.o. female with medical history significant of DM2, HTN, recurrent ESBL UTIs.  Pt most recently just admitted last month for ESBL UTI.  2 days of carbapenem followed by discharge on cipro according to daughter.  Pt presents to ED with daughter.  Daughter reports increased confusion which is what she usually sees with patients UTIs.  Has 24h home care, ambulates with assistance at baseline.  No CP, cough, N/V/D.   ED Course: UA suggestive of UTI, Tm 99.x, WBC 13k.   Review of Systems: As per HPI, otherwise all review of systems negative.  Past Medical History:  Diagnosis Date  . Anxiety   . Arthritis   . Asthma   . Cataracts, bilateral    removed by surgery  . COPD (chronic obstructive pulmonary disease) ([place])   . Depression   . Diabetes mellitus without complication ([place])    type 2  . Dyspnea    with exertion  . Fibromyalgia   . GERD (gastroesophageal reflux disease)   . History of chemotherapy   . History of fractured pelvis   . Hypertension   . Hypothyroidism   . Osteoporosis 12/2017   T score -2.8 stable from prior DEXA  . Osteoporosis   . Ovarian cancer ([place])   . Pneumonia    several times    Past Surgical History:  Procedure Laterality Date  . ABDOMINAL HYSTERECTOMY  1996  . ANKLE FRACTURE SURGERY     plate and screws  . BIOPSY  02/05/2019   Procedure: BIOPSY;  Surgeon: Rush Landmark Telford Nab., MD;  Location: [place];  Service: Gastroenterology;;  . BLADDER SUSPENSION    . CATARACT EXTRACTION    . COLONOSCOPY    . ESOPHAGEAL BRUSHING  02/05/2019   Procedure: ESOPHAGEAL BRUSHING;  Surgeon: Rush Landmark Telford Nab., MD;  Location: [place];  Service:  Gastroenterology;;  . ESOPHAGOGASTRODUODENOSCOPY (EGD) WITH PROPOFOL N/A 02/05/2019   Procedure: ESOPHAGOGASTRODUODENOSCOPY (EGD) WITH PROPOFOL with dialtion;  Surgeon: Irving Copas., MD;  Location: [facility];  Service: Gastroenterology;  Laterality: N/A;  . EYE SURGERY     cataracts removed-bilateral  . hip surgey    . knee surgey    . LUNG SURGERY    . OOPHORECTOMY     BSO  . SAVORY DILATION N/A 02/05/2019   Procedure: SAVORY DILATION;  Surgeon: Rush Landmark Telford Nab., MD;  Location: [place];  Service: Gastroenterology;  Laterality: N/A;  . TONSILLECTOMY    . UPPER GI ENDOSCOPY       reports that she has never smoked. She has never used smokeless tobacco. She reports that she does not drink alcohol and does not use drugs.  Allergies  Allergen Reactions  . Latex Rash  . Penicillins Rash    Has patient had a PCN reaction causing immediate rash, facial/tongue/throat swelling, SOB or lightheadedness with hypotension: No Has patient had a PCN reaction causing severe rash involving mucus membranes or skin necrosis: No Has patient had a PCN reaction that required hospitalization: No Has patient had a PCN reaction occurring within the last 10 years: No If all of the above answers are "NO", then may proceed with Cephalosporin use.     Family History  Problem Relation Age  of Onset  . Lung cancer Mother   . CAD Father   . Diabetes Father   . Lung cancer Maternal Aunt   . Diabetes Paternal Aunt   . Diabetes Paternal Uncle   . Diabetes Paternal Grandmother   . Diabetes Paternal Grandfather   . Colon cancer Neg Hx   . Esophageal cancer Neg Hx   . Inflammatory bowel disease Neg Hx   . Liver disease Neg Hx   . Pancreatic cancer Neg Hx   . Rectal cancer Neg Hx   . Stomach cancer Neg Hx      Prior to Admission medications   Medication Sig Start Date End Date Taking? Authorizing Provider  albuterol (PROAIR HFA) 108 (90 Base) MCG/ACT inhaler Inhale 2 puffs into  the lungs every 6 (six) hours as needed for wheezing or shortness of breath. 08/27/19   Baird Lyons D, MD  amitriptyline (ELAVIL) 10 MG tablet TAKE 1 TABLET BY MOUTH EVERYDAY AT BEDTIME Patient taking differently: Take 10 mg by mouth daily. 05/19/20   [name], MD  Apoaequorin (PREVAGEN PO) Take 1 tablet by mouth daily.    [provider]  Ascorbic Acid (VITAMIN C) 1000 MG tablet Take 1,000 mg by mouth 2 (two) times daily.    [provider]  baclofen (LIORESAL) 10 MG tablet TAKE 0.5-1 TABLET BY MOUTH TWICE DAILY AS NEEDED FOR MUSCLE SPASMS OR PAIN Patient taking differently: Take 10 mg by mouth daily. 06/19/20   [name], MD  BD PEN NEEDLE NANO 2ND GEN 32G X 4 MM MISC USE AS DIRECTED 4 TIMES A DAY 10/30/19   [name], MD  busPIRone (BUSPAR) 5 MG tablet TAKE 1 TO 2 TABLETS BY MOUTH TWICE A DAY Patient taking differently: Take 5-10 mg by mouth daily as needed (anxiety). 03/25/20   [name], MD  calcium carbonate (OSCAL) 1500 (600 Ca) MG TABS tablet Take 600 mg of elemental calcium by mouth 2 (two) times daily with a meal.    [provider]  cholecalciferol (VITAMIN D3) 25 MCG (1000 UNIT) tablet Take 1,000 Units by mouth daily.    [provider]  clotrimazole-betamethasone (LOTRISONE) cream APPLY TO AFFECTED AREA TWICE A DAY 02/26/20   [name], MD  Cyanocobalamin (VITAMIN B 12 PO) Take 2,500 mg by mouth See admin instructions. Takes 2 times a week - Tuesdays and Thursdays    [provider]  DULoxetine (CYMBALTA) 30 MG capsule Take 1 capsule (30 mg total) by mouth daily. Patient taking differently: Take 30 mg by mouth daily. Takes along with 60  mg capsule 05/19/20   [name], MD  DULoxetine (CYMBALTA) 60 MG capsule Take 1 capsule (60 mg total) by mouth daily. Patient taking differently: Take 60 mg by mouth daily. Takes along with 30 mg capsule 05/19/20   [name], MD   estradiol (ESTRACE) 0.1 MG/GM vaginal cream Place 1 Applicatorful vaginally See admin instructions. Takes 2 times a week 06/28/20   [provider]  feeding supplement (ENSURE ENLIVE / ENSURE PLUS) LIQD Take 237 mLs by mouth 2 (two) times daily between meals. Patient taking differently: Take 237 mLs by mouth 2 (two) times daily as needed (feeding supplement). 01/16/20   Hongalgi, Everlene Farrier D, MD  glucose blood (FREESTYLE LITE) test strip USE TO TEST BLOOD SUGAR 3 TIMES DAILY. DX: E11.65 03/04/20   Philemon Kingdom, MD  guaiFENesin (ROBITUSSIN) 100 MG/5ML liquid Take 200 mg by mouth at bedtime.  [provider]  HYDROcodone-acetaminophen (NORCO) 10-325 MG tablet Take 1 tablet by mouth every 4 (four) hours as needed for severe pain. 11/15/19   [provider]  insulin glargine (LANTUS SOLOSTAR) 100 UNIT/ML Solostar Pen Inject 8 Units into the skin daily. Patient taking differently: Inject 13 Units into the skin daily. 06/12/20   Philemon Kingdom, MD  ipratropium-albuterol (DUONEB) 0.5-2.5 (3) MG/3ML SOLN Inhale 3 mLs into the lungs 3 (three) times daily as needed (shortness of breath). USE 1 VIAL IN NEBULIZER 3 TIMES DAILY Patient taking differently: Inhale 3 mLs into the lungs 3 (three) times daily. 01/15/20   Hongalgi, Lenis Dickinson, MD  Lancets (FREESTYLE) lancets Use to test blood sugar 3 times daily. Dx: E11.65 10/14/16   Philemon Kingdom, MD  levothyroxine (SYNTHROID) 50 MCG tablet TAKE 1 TABLET BY MOUTH EVERY DAY BEFORE BREAKFAST Patient taking differently: Take 50 mcg by mouth daily before breakfast. 05/14/20   [name], MD  lidocaine (XYLOCAINE) 5 % ointment Apply 1 application topically as needed. Apply to external vulvar area Patient taking differently: Apply 1 application topically daily as needed for mild pain. 01/02/20   Joseph Pierini, MD  Lidocaine 4 % PTCH Apply 1 patch topically 2 (two) times daily as needed (pain).    [provider]   magnesium oxide (MAG-OX) 400 MG tablet TAKE 1 TABLET BY MOUTH EVERY DAY 07/30/20   [name], MD  methenamine (MANDELAMINE) 1 g tablet Take 1,000 mg by mouth 2 (two) times daily.    [provider]  Misc Natural Products (TART CHERRY ADVANCED PO) Take 1 tablet by mouth daily.    [provider]  montelukast (SINGULAIR) 10 MG tablet TAKE 1 TABLET BY MOUTH EVERY DAY Patient taking differently: Take 10 mg by mouth at bedtime. 06/19/20   Deneise Lever, MD  Multiple Vitamin (MULTIVITAMIN WITH MINERALS) TABS tablet Take 1 tablet by mouth daily. Centrum    [provider]  naloxone [facility]) nasal spray 4 mg/0.1 mL Place 1 spray into the nose once as needed (overdose). 06/27/20   [provider]  omeprazole (PRILOSEC) 20 MG capsule TAKE 1 CAPSULE BY MOUTH 2 TIMES A DAY BEFORE A MEAL Patient taking differently: Take 20 mg by mouth 2 (two) times daily before a meal. 05/21/20   Mansouraty, Telford Nab., MD  polyethylene glycol (MIRALAX / GLYCOLAX) 17 g packet Take 17 g by mouth daily.    [provider]  predniSONE (DELTASONE) 1 MG tablet Take 2 tablets (2 mg total) by mouth daily. Patient taking differently: Take 1 mg by mouth 2 (two) times daily with a meal. 07/25/18   [name], MD  Pseudoephedrine-guaiFENesin [facility] D MAX STRENGTH PO) Take 1,200 mg by mouth in the morning and at bedtime.    [provider]  rosuvastatin (CRESTOR) 10 MG tablet Take 1 tablet (10 mg total) by mouth every evening. 05/19/20   [name], MD  TRELEGY ELLIPTA 100-62.5-25 MCG/INH AEPB INHALE 1 PUFF INTO THE LUNGS DAILY. RINSE MOUTH Patient taking differently: Inhale 1 puff into the lungs daily. 07/05/20   Baird Lyons D, MD  TURMERIC PO Take 1 tablet by mouth daily.    [provider]    Physical Exam: Vitals:   07/30/20 1830 07/30/20 1855 07/30/20 1900 07/30/20 2000  BP: 125/75  (!) 122/111 139/84  Pulse: 90  89 84  Resp: (!) 22   18 (!) 23  Temp:  99.2 F (37.3 C)  TempSrc:  Rectal    SpO2: 100%  100% 99%  Weight:      Height:        Constitutional: NAD, calm, comfortable Eyes: PERRL, lids and conjunctivae normal ENMT: Mucous membranes are moist. Posterior pharynx clear of any exudate or lesions.Normal dentition.  Neck: normal, supple, no masses, no thyromegaly Respiratory: clear to auscultation bilaterally, no wheezing, no crackles. Normal respiratory effort. No accessory muscle use.  Cardiovascular: Regular rate and rhythm, no murmurs / rubs / gallops. No extremity edema. 2+ pedal pulses. No carotid bruits.  Abdomen: no tenderness, no masses palpated. No hepatosplenomegaly. Bowel sounds positive.  Musculoskeletal: no clubbing / cyanosis. No joint deformity upper and lower extremities. Good ROM, no contractures. Normal muscle tone.  Skin: no rashes, lesions, ulcers. No induration Neurologic: CN 2-12 grossly intact. Sensation intact, DTR normal. Strength 5/5 in all 4.  Psychiatric: Oriented to self, not time nor location.   Labs on Admission: I have personally reviewed following labs and imaging studies  CBC: Recent Labs  Lab 07/30/20 1712  WBC 13.3*  HGB 11.2*  HCT 35.2*  MCV 118.9*  PLT [id number]   Basic Metabolic Panel: Recent Labs  Lab 07/30/20 1712  NA 136  K 4.8  CL 107  CO2 18*  GLUCOSE 175*  BUN 24*  CREATININE 1.16*  CALCIUM 9.2   GFR: Estimated Creatinine Clearance: 27.3 mL/min (A) (by C-G formula based on SCr of 1.16 mg/dL (H)). Liver Function Tests: Recent Labs  Lab 07/30/20 1712  AST 16  ALT 13  ALKPHOS 24*  BILITOT 0.4  PROT 6.4*  ALBUMIN 3.5   No results for input(s): LIPASE, AMYLASE in the last 168 hours. No results for input(s): AMMONIA in the last 168 hours. Coagulation Profile: No results for input(s): INR, PROTIME in the last 168 hours. Cardiac Enzymes: No results for input(s): CKTOTAL, CKMB, CKMBINDEX, TROPONINI in the last 168 hours. BNP (last 3  results) Recent Labs    11/20/19 1549 01/25/20 1147  PROBNP 58.0 103.0*   HbA1C: No results for input(s): HGBA1C in the last 72 hours. CBG: No results for input(s): GLUCAP in the last 168 hours. Lipid Profile: No results for input(s): CHOL, HDL, LDLCALC, TRIG, CHOLHDL, LDLDIRECT in the last 72 hours. Thyroid Function Tests: No results for input(s): TSH, T4TOTAL, FREET4, T3FREE, THYROIDAB in the last 72 hours. Anemia Panel: No results for input(s): VITAMINB12, FOLATE, FERRITIN, TIBC, IRON, RETICCTPCT in the last 72 hours. Urine analysis:    Component Value Date/Time   COLORURINE YELLOW 07/30/2020 1900   APPEARANCEUR HAZY (A) 07/30/2020 1900   LABSPEC 1.035 (H) 07/30/2020 1900   PHURINE 5.0 07/30/2020 1900   GLUCOSEU 50 (A) 07/30/2020 1900   GLUCOSEU NEGATIVE 07/17/2020 0939   HGBUR NEGATIVE 07/30/2020 1900   BILIRUBINUR NEGATIVE 07/30/2020 1900   BILIRUBINUR 1+ 06/13/2019 1531   KETONESUR 5 (A) 07/30/2020 1900   PROTEINUR 100 (A) 07/30/2020 1900   UROBILINOGEN 0.2 07/17/2020 0939   NITRITE NEGATIVE 07/30/2020 1900   LEUKOCYTESUR MODERATE (A) 07/30/2020 1900    Radiological Exams on Admission: DG Chest Port 1 View  Result Date: 07/30/2020 CLINICAL DATA:  Weakness with confusion EXAM: PORTABLE CHEST 1 VIEW COMPARISON:  July 07, 2020 FINDINGS: No edema or airspace opacity. Heart size and pulmonary vascularity are normal. No adenopathy. Old healed fracture posterior right seventh rib, stable. Degenerative change noted in each shoulder. IMPRESSION: No edema or airspace opacity. Cardiac silhouette within normal limits. Electronically Signed   By: Lowella Grip III M.D.  On: 07/30/2020 18:19    EKG: Independently reviewed.  Assessment/Plan Principal Problem:   UTI due to extended-spectrum beta lactamase (ESBL) producing Escherichia coli Active Problems:   DM2 (diabetes mellitus, type 2) (HCC)   Acute metabolic encephalopathy    1. UTI - likely ESBL E.Coli based on  extensive history of same 1. Empiric Merrem 2. Cultures pending 3. Likely will need to complete course of IV merrem OR get PICC line placed for home Merrem. 1. Failed cipro after last admit.  Likely not truly sensitive to Cipro.  ESBLs have known high failure rates with ABx other than carbapenems. 2. Acute metabolic encephalopathy -  1. confusion, secondary to #1 above. 3. DM2 - 1. Cont home lantus 4. Chronic pain 1. Cont Norco Q4H  DVT prophylaxis: Lovenox Code Status: DNR Family Communication: Daughter at bedside Disposition Plan: Home after treatment for ESBL UTI Consults called: None Admission status: Admit to inpatient  Severity of Illness: The appropriate patient status for this patient is INPATIENT. Inpatient status is judged to be reasonable and necessary in order to provide the required intensity of service to ensure the patient's safety. The patient's presenting symptoms, physical exam findings, and initial radiographic and laboratory data in the context of their chronic comorbidities is felt to place them at high risk for further clinical deterioration. Furthermore, it is not anticipated that the patient will be medically stable for discharge from the hospital within 2 midnights of admission. The following factors support the patient status of inpatient.   IP status due to need for IV ABX to treat what is likely to be an ESBL E.Coli UTI based on pt extensive history of same.   * I certify that at the point of admission it is my clinical judgment that the patient will require inpatient hospital care spanning beyond 2 midnights from the point of admission due to high intensity of service, high risk for further deterioration and high frequency of surveillance required.*    Srihaan Mastrangelo M. DO [facility]  How to contact the [facility] Attending or Consulting provider [place] or covering provider during after hours [place], for this patient?  1. Check the care team in [facility] and  look for a) attending/consulting [facility] provider listed and b) the [facility] team listed 2. Log into [email or web address]  Amion Physician Scheduling and messaging for groups and whole hospitals  On call and physician scheduling software for group practices, residents, hospitalists and other medical providers for call, clinic, rotation and shift schedules. On[employer] is a hospital-wide system for scheduling doctors and paging doctors on call. EasyPlot is for scientific plotting and data analysis.  [email or web address]  and use [place]'s universal password to access. If you do not have the password, please contact the hospital operator.  3. Locate the [facility] provider you are looking for under [facility] and page to a number that you can be directly reached. 4. If you still have difficulty reaching the provider, please page the [facility] (Director on Call) for the Hospitalists listed on amion for assistance.  07/30/2020, 8:37 PM

## 2020-07-30 NOTE — Telephone Encounter (Signed)
We don't prescribe her prednisone. She would need to call her prescribing doctor to ask about this.

## 2020-07-30 NOTE — Telephone Encounter (Signed)
Liji w/ [facility] called and said that the patient missed her PT visit today because she was taken to the ER for UTI symptoms. Please advise    Phone: [phone]

## 2020-07-30 NOTE — ED Notes (Signed)
ED Provider at bedside. 

## 2020-07-30 NOTE — ED Triage Notes (Signed)
Patient's daughter brought pt, she got dx w/ a UTI today, daughter states she has to get an IV abx for her UTI, hx of ESBL e-coli infections. Daughter reports that pt is 'totally not herself, she is confused' has been about 1 week of AMS. Alert to self, not time or location.

## 2020-07-30 NOTE — ED Provider Notes (Signed)
[facility] DEPT Provider Note   CSN: [id number] Arrival date & time: 07/30/20  1607     History Chief Complaint  Patient presents with  . Altered Mental Status  . Urinary Tract Infection    Kayla Thompson is a 85 y.o. female.  HPI  85 year old female history of urinary tract infections presents today with reports that she has a urinary tract infection.  Her daughter is the historian.  She states that she was seen at [facility] urology today.  She states that she usually gets ESBL urinary tract infections and has had sepsis from this.  Review of records reveal last positive urine culture 07/07/2020 with E. coli with multiple resistance.  Daughter states that she has ongoing frequency and poor bladder control.  However she has had increasing confusion which is what she usually sees with her urinary tract infections.  They have not noted any lateralized weakness, chest pain, cough, nausea, vomiting, diarrhea.  She has been eating and drinking without difficulty.  She is able to ambulate short distances with a walker and has 24-hour around-the-clock aides in the home.    Past Medical History:  Diagnosis Date  . Anxiety   . Arthritis   . Asthma   . Cataracts, bilateral    removed by surgery  . COPD (chronic obstructive pulmonary disease) ([place])   . Depression   . Diabetes mellitus without complication ([place])    type 2  . Dyspnea    with exertion  . Fibromyalgia   . GERD (gastroesophageal reflux disease)   . History of chemotherapy   . History of fractured pelvis   . Hypertension   . Hypothyroidism   . Osteoporosis 12/2017   T score -2.8 stable from prior DEXA  . Osteoporosis   . Ovarian cancer ([place])   . Pneumonia    several times    Patient Active Problem List   Diagnosis Date Noted  . AMS (altered mental status) 07/07/2020  . Hypertension associated with diabetes ([place]) 05/07/2020  . Hyperlipidemia associated with type 2 diabetes mellitus  ([place]) 05/07/2020  . Depression with anxiety 05/07/2020  . PAD (peripheral artery disease) ([place]) 02/05/2020  . Lower extremity edema 11/20/2019  . UTI symptoms 05/29/2019  . Dysuria 05/11/2019  . Upper esophageal web 03/27/2019  . Iron deficiency anemia 03/27/2019  . Candida esophagitis ([place]) 02/14/2019  . Acute lower UTI 01/25/2019  . Acute metabolic encephalopathy 99991111  . Encounter for general adult medical examination with abnormal findings 01/09/2019  . Abnormal barium swallow 12/28/2018  . History of colonic polyps 12/28/2018  . Dysphagia 12/28/2018  . Diabetic foot ulcer ([place]) 12/05/2018  . Rotator cuff arthropathy, left 11/16/2018  . Degenerative arthritis of left knee 11/16/2018  . Insomnia 09/26/2018  . Leukocytosis 10/17/2017  . Severe protein-calorie malnutrition ([place]) 10/10/2017  . Memory changes 07/29/2017  . Granulomatous lung disease ([place]) 06/14/2017  . Tracheobronchomalacia 06/14/2017  . Dizziness 03/04/2017  . Exercise hypoxemia 07/12/2016  . Diastolic CHF, chronic ([place]) 12/25/2015  . GERD (gastroesophageal reflux disease) 12/25/2015  . Epidermal inclusion cyst 09/02/2015  . Muscle cramps 03/06/2015  . Cough 01/07/2015  . Bronchiectasis without acute exacerbation ([place]) 01/07/2015  . PMR (polymyalgia rheumatica) ([place]) 01/07/2015  . Osteoarthritis 01/07/2015  . Asthma, chronic 11/12/2014  . Anxiety state 11/12/2014  . Hyperparathyroidism ([place]) 10/28/2014  . Fibromyalgia 09/21/2014  . Personal history of ovarian cancer 08/23/2014  . Osteoporosis 08/23/2014  . Diabetes mellitus with coincident hypertension ([place]) 08/02/2014  . HLD (  hyperlipidemia) 08/02/2014  . Hypothyroidism 08/02/2014    Past Surgical History:  Procedure Laterality Date  . ABDOMINAL HYSTERECTOMY  1996  . ANKLE FRACTURE SURGERY     plate and screws  . BIOPSY  02/05/2019   Procedure: BIOPSY;  Surgeon: Rush Landmark Telford Nab., MD;  Location: [facility];  Service: Gastroenterology;;   . BLADDER SUSPENSION    . CATARACT EXTRACTION    . COLONOSCOPY    . ESOPHAGEAL BRUSHING  02/05/2019   Procedure: ESOPHAGEAL BRUSHING;  Surgeon: Rush Landmark Telford Nab., MD;  Location: [facility];  Service: Gastroenterology;;  . ESOPHAGOGASTRODUODENOSCOPY (EGD) WITH PROPOFOL N/A 02/05/2019   Procedure: ESOPHAGOGASTRODUODENOSCOPY (EGD) WITH PROPOFOL with dialtion;  Surgeon: Irving Copas., MD;  Location: [facility];  Service: Gastroenterology;  Laterality: N/A;  . EYE SURGERY     cataracts removed-bilateral  . hip surgey    . knee surgey    . LUNG SURGERY    . OOPHORECTOMY     BSO  . SAVORY DILATION N/A 02/05/2019   Procedure: SAVORY DILATION;  Surgeon: Rush Landmark Telford Nab., MD;  Location: [facility];  Service: Gastroenterology;  Laterality: N/A;  . TONSILLECTOMY    . UPPER GI ENDOSCOPY       OB History    Gravida  1   Para  1   Term      Preterm      AB      Living  1     SAB      IAB      Ectopic      Multiple      Live Births              Family History  Problem Relation Age of Onset  . Lung cancer Mother   . CAD Father   . Diabetes Father   . Lung cancer Maternal Aunt   . Diabetes Paternal Aunt   . Diabetes Paternal Uncle   . Diabetes Paternal Grandmother   . Diabetes Paternal Grandfather   . Colon cancer Neg Hx   . Esophageal cancer Neg Hx   . Inflammatory bowel disease Neg Hx   . Liver disease Neg Hx   . Pancreatic cancer Neg Hx   . Rectal cancer Neg Hx   . Stomach cancer Neg Hx     Social History   Tobacco Use  . Smoking status: Never Smoker  . Smokeless tobacco: Never Used  Vaping Use  . Vaping Use: Never used  Substance Use Topics  . Alcohol use: No    Alcohol/week: 0.0 standard drinks  . Drug use: No    Home Medications Prior to Admission medications   Medication Sig Start Date End Date Taking? Authorizing Provider  albuterol (PROAIR HFA) 108 (90 Base) MCG/ACT inhaler Inhale 2 puffs into the lungs every 6  (six) hours as needed for wheezing or shortness of breath. 08/27/19   Baird Lyons D, MD  amitriptyline (ELAVIL) 10 MG tablet TAKE 1 TABLET BY MOUTH EVERYDAY AT BEDTIME Patient taking differently: Take 10 mg by mouth daily. 05/19/20   [name], MD  Apoaequorin (PREVAGEN PO) Take 1 tablet by mouth daily.    [provider]  Ascorbic Acid (VITAMIN C) 1000 MG tablet Take 1,000 mg by mouth 2 (two) times daily.    [provider]  baclofen (LIORESAL) 10 MG tablet TAKE 0.5-1 TABLET BY MOUTH TWICE DAILY AS NEEDED FOR MUSCLE SPASMS OR PAIN Patient taking differently: Take 10 mg by mouth daily. 06/19/20  [name], MD  BD PEN NEEDLE NANO 2ND GEN 32G X 4 MM MISC USE AS DIRECTED 4 TIMES A DAY 10/30/19   [name], MD  busPIRone (BUSPAR) 5 MG tablet TAKE 1 TO 2 TABLETS BY MOUTH TWICE A DAY Patient taking differently: Take 5-10 mg by mouth daily as needed (anxiety). 03/25/20   [name], MD  calcium carbonate (OSCAL) 1500 (600 Ca) MG TABS tablet Take 600 mg of elemental calcium by mouth 2 (two) times daily with a meal.    [provider]  cholecalciferol (VITAMIN D3) 25 MCG (1000 UNIT) tablet Take 1,000 Units by mouth daily.    [provider]  clotrimazole-betamethasone (LOTRISONE) cream APPLY TO AFFECTED AREA TWICE A DAY 02/26/20   [name], MD  Cyanocobalamin (VITAMIN B 12 PO) Take 2,500 mg by mouth See admin instructions. Takes 2 times a week - Tuesdays and Thursdays    [provider]  DULoxetine (CYMBALTA) 30 MG capsule Take 1 capsule (30 mg total) by mouth daily. Patient taking differently: Take 30 mg by mouth daily. Takes along with 60  mg capsule 05/19/20   [name], MD  DULoxetine (CYMBALTA) 60 MG capsule Take 1 capsule (60 mg total) by mouth daily. Patient taking differently: Take 60 mg by mouth daily. Takes along with 30 mg capsule 05/19/20   [name], MD  estradiol (ESTRACE)  0.1 MG/GM vaginal cream Place 1 Applicatorful vaginally See admin instructions. Takes 2 times a week 06/28/20   [provider]  feeding supplement (ENSURE ENLIVE / ENSURE PLUS) LIQD Take 237 mLs by mouth 2 (two) times daily between meals. Patient taking differently: Take 237 mLs by mouth 2 (two) times daily as needed (feeding supplement). 01/16/20   Hongalgi, Everlene Farrier D, MD  glucose blood (FREESTYLE LITE) test strip USE TO TEST BLOOD SUGAR 3 TIMES DAILY. DX: E11.65 03/04/20   Philemon Kingdom, MD  guaiFENesin (ROBITUSSIN) 100 MG/5ML liquid Take 200 mg by mouth at bedtime.    [provider]  HYDROcodone-acetaminophen (NORCO) 10-325 MG tablet Take 1 tablet by mouth every 4 (four) hours as needed for severe pain. 11/15/19   [provider]  insulin glargine (LANTUS SOLOSTAR) 100 UNIT/ML Solostar Pen Inject 8 Units into the skin daily. Patient taking differently: Inject 13 Units into the skin daily. 06/12/20   Philemon Kingdom, MD  ipratropium-albuterol (DUONEB) 0.5-2.5 (3) MG/3ML SOLN Inhale 3 mLs into the lungs 3 (three) times daily as needed (shortness of breath). USE 1 VIAL IN NEBULIZER 3 TIMES DAILY Patient taking differently: Inhale 3 mLs into the lungs 3 (three) times daily. 01/15/20   Hongalgi, Lenis Dickinson, MD  Lancets (FREESTYLE) lancets Use to test blood sugar 3 times daily. Dx: E11.65 10/14/16   Philemon Kingdom, MD  levothyroxine (SYNTHROID) 50 MCG tablet TAKE 1 TABLET BY MOUTH EVERY DAY BEFORE BREAKFAST Patient taking differently: Take 50 mcg by mouth daily before breakfast. 05/14/20   [name], MD  lidocaine (XYLOCAINE) 5 % ointment Apply 1 application topically as needed. Apply to external vulvar area Patient taking differently: Apply 1 application topically daily as needed for mild pain. 01/02/20   Joseph Pierini, MD  Lidocaine 4 % PTCH Apply 1 patch topically 2 (two) times daily as needed (pain).    [provider]  magnesium oxide (MAG-OX) 400  MG tablet TAKE 1 TABLET BY MOUTH EVERY DAY 07/30/20   [name], MD  methenamine (MANDELAMINE) 1 g tablet Take 1,000 mg  by mouth 2 (two) times daily.    [provider]  Misc Natural Products (TART CHERRY ADVANCED PO) Take 1 tablet by mouth daily.    [provider]  montelukast (SINGULAIR) 10 MG tablet TAKE 1 TABLET BY MOUTH EVERY DAY Patient taking differently: Take 10 mg by mouth at bedtime. 06/19/20   Deneise Lever, MD  Multiple Vitamin (MULTIVITAMIN WITH MINERALS) TABS tablet Take 1 tablet by mouth daily. Centrum    [provider]  naloxone [facility]) nasal spray 4 mg/0.1 mL Place 1 spray into the nose once as needed (overdose). 06/27/20   [provider]  omeprazole (PRILOSEC) 20 MG capsule TAKE 1 CAPSULE BY MOUTH 2 TIMES A DAY BEFORE A MEAL Patient taking differently: Take 20 mg by mouth 2 (two) times daily before a meal. 05/21/20   Mansouraty, Telford Nab., MD  polyethylene glycol (MIRALAX / GLYCOLAX) 17 g packet Take 17 g by mouth daily.    [provider]  predniSONE (DELTASONE) 1 MG tablet Take 2 tablets (2 mg total) by mouth daily. Patient taking differently: Take 1 mg by mouth 2 (two) times daily with a meal. 07/25/18   [name], MD  Pseudoephedrine-guaiFENesin [facility] D MAX STRENGTH PO) Take 1,200 mg by mouth in the morning and at bedtime.    [provider]  rosuvastatin (CRESTOR) 10 MG tablet Take 1 tablet (10 mg total) by mouth every evening. 05/19/20   [name], MD  TRELEGY ELLIPTA 100-62.5-25 MCG/INH AEPB INHALE 1 PUFF INTO THE LUNGS DAILY. RINSE MOUTH Patient taking differently: Inhale 1 puff into the lungs daily. 07/05/20   Baird Lyons D, MD  TURMERIC PO Take 1 tablet by mouth daily.    [provider]    Allergies    Latex and Penicillins  Review of Systems   Review of Systems  All other systems reviewed and are negative.   Physical Exam Updated Vital Signs BP 128/89 (BP  Location: Left Arm)   Pulse 100   Temp 98.1 F (36.7 C) (Oral)   Resp 16   Ht 1.499 m (4\' 11" )   Wt 53 kg   LMP  (LMP Unknown)   SpO2 98%   BMI 23.60 kg/m   Physical Exam Vitals and nursing note reviewed.  Constitutional:      General: She is not in acute distress.    Appearance: Normal appearance.  HENT:     Head: Normocephalic.     Right Ear: External ear normal.     Left Ear: External ear normal.     Nose: Nose normal.     Mouth/Throat:     Mouth: Mucous membranes are moist.  Eyes:     Extraocular Movements: Extraocular movements intact.     Pupils: Pupils are equal, round, and reactive to light.  Cardiovascular:     Rate and Rhythm: Normal rate and regular rhythm.  Musculoskeletal:     Cervical back: Normal range of motion.  Neurological:     Mental Status: She is alert.     ED Results / Procedures / Treatments   Labs (all labs ordered are listed, but only abnormal results are displayed) Labs Reviewed - No data to display  EKG EKG Interpretation  Date/Time:  Wednesday Jul 30 2020 17:03:58 EDT Ventricular Rate:  96 PR Interval:  122 QRS Duration: 86 QT Interval:  364 QTC Calculation: 460 R Axis:   63 Text Interpretation: Sinus rhythm Confirmed by Pattricia Boss [phone]) on 07/30/2020 8:33:41 PM  Radiology DG Chest Port 1 View  Result Date: 07/30/2020 CLINICAL DATA:  Weakness with confusion EXAM: PORTABLE CHEST 1 VIEW COMPARISON:  July 07, 2020 FINDINGS: No edema or airspace opacity. Heart size and pulmonary vascularity are normal. No adenopathy. Old healed fracture posterior right seventh rib, stable. Degenerative change noted in each shoulder. IMPRESSION: No edema or airspace opacity. Cardiac silhouette within normal limits. Electronically Signed   By: Lowella Grip III M.D.   On: 07/30/2020 18:19    Procedures Procedures   Medications Ordered in ED Medications - No data to display  ED Course  I have reviewed the triage vital signs and the  nursing notes.  Pertinent labs & imaging results that were available during my care of the patient were reviewed by me and considered in my medical decision making (see chart for details).    MDM Rules/Calculators/A&P                          85 yo female with ho uti secondary to esbl, recently treated with miripenem presents with return of uti sx, confusion. Cath urine, c.w. uti IV abx ordered.  Discussed with Dr. Alcario Drought who will see for admission UTI Afebrile Mild leukocytosis Mild anemia Hyperglycemia Mild aki  Final Clinical Impression(s) / ED Diagnoses Final diagnoses:  Altered mental status, unspecified altered mental status type  Urinary tract infection without hematuria, site unspecified    Rx / DC Orders ED Discharge Orders    None       Pattricia Boss, MD 07/30/20 2035

## 2020-07-30 NOTE — Telephone Encounter (Signed)
See below

## 2020-07-30 NOTE — Progress Notes (Signed)
Pharmacy Antibiotic Note  Kayla Thompson is a 85 y.o. female admitted on 07/30/2020 with UTI.  Pharmacy has been consulted for Meropenem dosing for history of ESBL UTI (most recently on 07/07/20)  Plan: Meropenem 1g IV q12h  Height: 4\' 11"  (149.9 cm) Weight: 53 kg (116 lb 13.5 oz) IBW/kg (Calculated) : 43.2  Temp (24hrs), Avg:98.4 F (36.9 C), Min:97.8 F (36.6 C), Max:99.2 F (37.3 C)  Recent Labs  Lab 07/30/20 1712  WBC 13.3*  CREATININE 1.16*    Estimated Creatinine Clearance: 27.3 mL/min (A) (by C-G formula based on SCr of 1.16 mg/dL (H)).    Allergies  Allergen Reactions  . Latex Rash  . Penicillins Rash    Has patient had a PCN reaction causing immediate rash, facial/tongue/throat swelling, SOB or lightheadedness with hypotension: No Has patient had a PCN reaction causing severe rash involving mucus membranes or skin necrosis: No Has patient had a PCN reaction that required hospitalization: No Has patient had a PCN reaction occurring within the last 10 years: No If all of the above answers are "NO", then may proceed with Cephalosporin use.     Antimicrobials this admission: 5/11 Meropenem >>   Dose adjustments this admission:   Microbiology results: 5/11 UCx:   Thank you for allowing pharmacy to be a part of this patient's care.  Gretta Arab PharmD, BCPS Clinical Pharmacist [facility] main pharmacy [phone] 07/30/2020 8:46 PM

## 2020-07-31 DIAGNOSIS — G8929 Other chronic pain: Secondary | ICD-10-CM

## 2020-07-31 DIAGNOSIS — F32A Depression, unspecified: Secondary | ICD-10-CM

## 2020-07-31 DIAGNOSIS — K219 Gastro-esophageal reflux disease without esophagitis: Secondary | ICD-10-CM

## 2020-07-31 DIAGNOSIS — E039 Hypothyroidism, unspecified: Secondary | ICD-10-CM

## 2020-07-31 LAB — BASIC METABOLIC PANEL
Anion gap: 8 (ref 5–15)
BUN: 20 mg/dL (ref 8–23)
CO2: 19 mmol/L — ABNORMAL LOW (ref 22–32)
Calcium: 8.9 mg/dL (ref 8.9–10.3)
Chloride: 110 mmol/L (ref 98–111)
Creatinine, Ser: 0.98 mg/dL (ref 0.44–1.00)
GFR, Estimated: 57 mL/min — ABNORMAL LOW (ref >60)
Glucose, Bld: 139 mg/dL — ABNORMAL HIGH (ref 70–99)
Potassium: 3.9 mmol/L (ref 3.5–5.1)
Sodium: 137 mmol/L (ref 135–145)

## 2020-07-31 LAB — CBC
HCT: 32.9 % — ABNORMAL LOW (ref 36.0–46.0)
Hemoglobin: 10.4 g/dL — ABNORMAL LOW (ref 12.0–15.0)
MCH: 37.7 pg — ABNORMAL HIGH (ref 26.0–34.0)
MCHC: 31.6 g/dL (ref 30.0–36.0)
MCV: 119.2 fL — ABNORMAL HIGH (ref 80.0–100.0)
Platelets: 261 10*3/uL (ref 150–400)
RBC: 2.76 MIL/uL — ABNORMAL LOW (ref 3.87–5.11)
RDW: 14.4 % (ref 11.5–15.5)
WBC: 14.3 10*3/uL — ABNORMAL HIGH (ref 4.0–10.5)
nRBC: 0 % (ref 0.0–0.2)

## 2020-07-31 MED ORDER — INSULIN ASPART 100 UNIT/ML IJ SOLN
0.0000 [IU] | Freq: Three times a day (TID) | INTRAMUSCULAR | Status: DC
  Administered 2020-08-01: 2 [IU] via SUBCUTANEOUS
  Administered 2020-08-01 – 2020-08-02 (×2): 1 [IU] via SUBCUTANEOUS
  Administered 2020-08-03 – 2020-08-04 (×4): 2 [IU] via SUBCUTANEOUS

## 2020-07-31 MED ORDER — IPRATROPIUM-ALBUTEROL 0.5-2.5 (3) MG/3ML IN SOLN: 3.0000 mL | Freq: Three times a day (TID) | RESPIRATORY_TRACT | Status: DC

## 2020-07-31 MED ORDER — ALBUTEROL SULFATE (2.5 MG/3ML) 0.083% IN NEBU
2.5000 mg | INHALATION_SOLUTION | Freq: Three times a day (TID) | RESPIRATORY_TRACT | Status: DC
  Administered 2020-07-31 – 2020-08-03 (×7): 2.5 mg via RESPIRATORY_TRACT
  Filled 2020-07-31 (×7): qty 3

## 2020-07-31 MED ORDER — ALBUTEROL SULFATE (2.5 MG/3ML) 0.083% IN NEBU: 2.5000 mg | INHALATION_SOLUTION | Freq: Two times a day (BID) | RESPIRATORY_TRACT | Status: DC

## 2020-07-31 MED ORDER — SODIUM CHLORIDE 0.9 % IV SOLN: INTRAVENOUS | Status: DC

## 2020-07-31 NOTE — Progress Notes (Signed)
PROGRESS NOTE    Kayla Thompson  WNU:[id number] DOB: [birth year] DOA: 07/30/2020 PCP: [name], MD    Chief Complaint  Patient presents with  . Altered Mental Status  . Urinary Tract Infection    Brief Narrative:  HPI per Dr. Rayburn Felt Kayla Thompson is a 85 y.o. female with medical history significant of DM2, HTN, recurrent ESBL UTIs.  Pt most recently just admitted last month for ESBL UTI.  2 days of carbapenem followed by discharge on cipro according to daughter.  Pt presents to ED with daughter.  Daughter reports increased confusion which is what she usually sees with patients UTIs.  Has 24h home care, ambulates with assistance at baseline.  No CP, cough, N/V/D.   ED Course: UA suggestive of UTI, Tm 99.x, WBC 13k.   Assessment & Plan:   Principal Problem:   UTI due to extended-spectrum beta lactamase (ESBL) producing Escherichia coli Active Problems:   DM2 (diabetes mellitus, type 2) (HCC)   Acute metabolic encephalopathy   #1 recurrent UTI-likely secondary to ESBL E. coli due to prior extensive history. -Urine cultures pending. -Daughter adamant about blood cultures been ordered and as such those have been ordered. -Patient afebrile. -Normal white count. -Continue IV Merrem, IV fluids, supportive care. -May need outpatient follow-up with urology  2.  Acute metabolic encephalopathy Likely secondary to problem #1 the setting of dehydration -Fluid resuscitation with IV fluids.-Continue empiric IV antibiotics. -Supportive care.  3.  Chronic pain -Continue home regimen Norco.  4.  Well-controlled diabetes mellitus type 2 -Hemoglobin A1c 5.6 (07/07/2020) -CBG 128 this morning. -Continue current dose of Lantus. -Place on SSI. -Follow.  5.  Depression -Stable. -Continue home regimen Cymbalta, Elavil.  6.  GERD -PPI.  7.  Hypothyroidism -Continue home regimen Synthroid.    DVT prophylaxis: Lovenox Code Status: DNR Family  Communication: Updated patient, daughter [name]) at bedside. Disposition:   Status is: Inpatient    Dispo: The patient is from: Home              Anticipated d/c is to: TBD              Patient currently pancultured, urine cultures pending, on IV antibiotics, not stable for discharge.   Difficult to place patient no       Consultants:   None  Procedures:   Chest x-ray 07/31/2018  Antimicrobials:   IV Merrem 07/30/2020>>>>   Subjective: Patient laying in bed.  Alert to self and place.  Knows who the president is.  Does not know what year it is.  States she just does not feel too well today.  Denies chest pain, no shortness of breath, no abdominal pain.  States dysuria improving. Daughter insistent on blood cultures being drawn due to concern patient may have a bacteremia.  Objective: Vitals:   07/31/20 0633 07/31/20 0756 07/31/20 1039 07/31/20 1451  BP: 129/67  130/88 117/65  Pulse: 98  (!) 105 (!) 105  Resp: 16  20 17   Temp: 98 F (36.7 C)  (!) 97.5 F (36.4 C) 97.6 F (36.4 C)  TempSrc:   Oral   SpO2: 96% 97% 96% 98%  Weight:      Height:        Intake/Output Summary (Last 24 hours) at 07/31/2020 1557 Last data filed at 07/31/2020 1420 Gross per 24 hour  Intake 240 ml  Output 450 ml  Net -210 ml   Filed Weights   07/30/20 1623  Weight: 53 kg  Examination:  General exam: NAD.  Dry mucous membranes. Respiratory system: Clear to auscultation bilaterally.  No wheezes, no crackles, no rhonchi.Marland Kitchen Respiratory effort normal. Cardiovascular system: S1 & S2 heard, RRR. No JVD, murmurs, rubs, gallops or clicks. No pedal edema. Gastrointestinal system: Abdomen is nondistended, soft and nontender. No organomegaly or masses felt. Normal bowel sounds heard. Central nervous system: Alert and oriented to self and place only. No focal neurological deficits. Extremities: Symmetric 5 x 5 power. Skin: No rashes, lesions or ulcers Psychiatry: Judgement and insight appear  poor. Mood & affect appropriate.     Data Reviewed: I have personally reviewed following labs and imaging studies  CBC: Recent Labs  Lab 07/30/20 1712 07/31/20 0315  WBC 13.3* 14.3*  HGB 11.2* 10.4*  HCT 35.2* 32.9*  MCV 118.9* 119.2*  PLT 316 175    Basic Metabolic Panel: Recent Labs  Lab 07/30/20 1712 07/31/20 0315  NA 136 137  K 4.8 3.9  CL 107 110  CO2 18* 19*  GLUCOSE 175* 139*  BUN 24* 20  CREATININE 1.16* 0.98  CALCIUM 9.2 8.9    GFR: Estimated Creatinine Clearance: 32.3 mL/min (by C-G formula based on SCr of 0.98 mg/dL).  Liver Function Tests: Recent Labs  Lab 07/30/20 1712  AST 16  ALT 13  ALKPHOS 24*  BILITOT 0.4  PROT 6.4*  ALBUMIN 3.5    CBG: No results for input(s): GLUCAP in the last 168 hours.   Recent Results (from the past 240 hour(s))  Resp Panel by RT-PCR (Flu A&B, Covid) Nasopharyngeal Swab     Status: None   Collection Time: 07/30/20  5:15 PM   Specimen: Nasopharyngeal Swab; Nasopharyngeal(NP) swabs in vial transport medium  Result Value Ref Range Status   SARS Coronavirus 2 by RT PCR NEGATIVE NEGATIVE Final    Comment: (NOTE) SARS-CoV-2 target nucleic acids are NOT DETECTED.  The SARS-CoV-2 RNA is generally detectable in upper respiratory specimens during the acute phase of infection. The lowest concentration of SARS-CoV-2 viral copies this assay can detect is 138 copies/mL. A negative result does not preclude SARS-Cov-2 infection and should not be used as the sole basis for treatment or other patient management decisions. A negative result may occur with  improper specimen collection/handling, submission of specimen other than nasopharyngeal swab, presence of viral mutation(s) within the areas targeted by this assay, and inadequate number of viral copies(<138 copies/mL). A negative result must be combined with clinical observations, patient history, and epidemiological information. The expected result is Negative.  Fact  Sheet for Patients:  EntrepreneurPulse.com.au  Fact Sheet for Healthcare Providers:  IncredibleEmployment.be  This test is no t yet approved or cleared by the [place] FDA and  has been authorized for detection and/or diagnosis of SARS-CoV-2 by FDA under an Emergency Use Authorization (EUA). This EUA will remain  in effect (meaning this test can be used) for the duration of the COVID-19 declaration under Section 564(b)(1) of the Act, 21 U.S.C.section 360bbb-3(b)(1), unless the authorization is terminated  or revoked sooner.       Influenza A by PCR NEGATIVE NEGATIVE Final   Influenza B by PCR NEGATIVE NEGATIVE Final    Comment: (NOTE) The Xpert Xpress SARS-CoV-2/FLU/RSV plus assay is intended as an aid in the diagnosis of influenza from Nasopharyngeal swab specimens and should not be used as a sole basis for treatment. Nasal washings and aspirates are unacceptable for Xpert Xpress SARS-CoV-2/FLU/RSV testing.  Fact Sheet for Patients: EntrepreneurPulse.com.au  Fact Sheet for Healthcare Providers: IncredibleEmployment.be  This test is not yet approved or cleared by the [place] and has been authorized for detection and/or diagnosis of SARS-CoV-2 by FDA under an Emergency Use Authorization (EUA). This EUA will remain in effect (meaning this test can be used) for the duration of the COVID-19 declaration under Section 564(b)(1) of the Act, 21 U.S.C. section 360bbb-3(b)(1), unless the authorization is terminated or revoked.  Performed at [facility], [place] [address], [place], [place] [address]          Radiology Studies: [facility] Chest Port 1 View  Result Date: 07/30/2020 CLINICAL DATA:  Weakness with confusion EXAM: PORTABLE CHEST 1 VIEW COMPARISON:  July 07, 2020 FINDINGS: No edema or airspace opacity. Heart size and pulmonary vascularity are normal. No adenopathy. Old  healed fracture posterior right seventh rib, stable. Degenerative change noted in each shoulder. IMPRESSION: No edema or airspace opacity. Cardiac silhouette within normal limits. Electronically Signed   By: Lowella Grip III M.D.   On: 07/30/2020 18:19        Scheduled Meds: . albuterol  2.5 mg Nebulization BID  . amitriptyline  10 mg Oral QHS  . vitamin C  1,000 mg Oral BID  . baclofen  10 mg Oral Daily  . DULoxetine  90 mg Oral Daily  . enoxaparin (LOVENOX) injection  30 mg Subcutaneous Q24H  . estradiol  1 Applicatorful Vaginal Once per day on Mon Thu  . fluticasone furoate-vilanterol  1 puff Inhalation Daily   And  . umeclidinium bromide  1 puff Inhalation Daily  . guaiFENesin  1,200 mg Oral BID  . HYDROcodone-acetaminophen  1 tablet Oral Q4H  . insulin glargine  8 Units Subcutaneous Daily  . levothyroxine  50 mcg Oral QAC breakfast  . magnesium oxide  400 mg Oral QHS  . montelukast  10 mg Oral QHS  . multivitamin with minerals  1 tablet Oral Daily  . pantoprazole  40 mg Oral BID  . polyethylene glycol  17 g Oral Daily  . predniSONE  1 mg Oral BID WC  . pseudoephedrine  120 mg Oral BID  . rosuvastatin  10 mg Oral QPM   Continuous Infusions: . sodium chloride Stopped (07/31/20 1400)  . meropenem (MERREM) IV 1 g (07/31/20 1046)     LOS: 1 day    Time spent: 35 minutes    Irine Seal, MD [facility]   To contact the attending provider between 7A-7P or the covering provider during after hours 7P-7A, please log into the web site [email or web address] and access using universal [place] password for that web site. If you do not have the password, please call the hospital operator.  07/31/2020, 3:57 PM

## 2020-07-31 NOTE — Telephone Encounter (Signed)
Unable to get in contact with the daughter. LDVM with Dr. Nathanial Millman recommendations. Office number was provided in case she has additional questions or concerns. I have also sent a mychart message as well.

## 2020-07-31 NOTE — Telephone Encounter (Signed)
Called patient daughter Gardiner Ramus) who is on HIPPA , she states her mother was seen in the ER yesterday and was admitted to [facility]. Patient missed her PT appointment [facility] at [facility] yesterday.

## 2020-08-01 LAB — CBC WITH DIFFERENTIAL/PLATELET
Abs Immature Granulocytes: 0.09 10*3/uL — ABNORMAL HIGH (ref 0.00–0.07)
Basophils Absolute: 0.1 10*3/uL (ref 0.0–0.1)
Basophils Relative: 0 %
Eosinophils Absolute: 0.3 10*3/uL (ref 0.0–0.5)
Eosinophils Relative: 3 %
HCT: 30.6 % — ABNORMAL LOW (ref 36.0–46.0)
Hemoglobin: 9.6 g/dL — ABNORMAL LOW (ref 12.0–15.0)
Immature Granulocytes: 1 %
Lymphocytes Relative: 34 %
Lymphs Abs: 4 10*3/uL (ref 0.7–4.0)
MCH: 36.8 pg — ABNORMAL HIGH (ref 26.0–34.0)
MCHC: 31.4 g/dL (ref 30.0–36.0)
MCV: 117.2 fL — ABNORMAL HIGH (ref 80.0–100.0)
Monocytes Absolute: 0.6 10*3/uL (ref 0.1–1.0)
Monocytes Relative: 5 %
Neutro Abs: 6.7 10*3/uL (ref 1.7–7.7)
Neutrophils Relative %: 57 %
Platelets: 254 10*3/uL (ref 150–400)
RBC: 2.61 MIL/uL — ABNORMAL LOW (ref 3.87–5.11)
RDW: 14.4 % (ref 11.5–15.5)
WBC: 11.7 10*3/uL — ABNORMAL HIGH (ref 4.0–10.5)
nRBC: 0 % (ref 0.0–0.2)

## 2020-08-01 LAB — AMMONIA: Ammonia: 84 umol/L — ABNORMAL HIGH (ref 9–35)

## 2020-08-01 LAB — BASIC METABOLIC PANEL
Anion gap: 6 (ref 5–15)
BUN: 20 mg/dL (ref 8–23)
CO2: 22 mmol/L (ref 22–32)
Calcium: 8.6 mg/dL — ABNORMAL LOW (ref 8.9–10.3)
Chloride: 110 mmol/L (ref 98–111)
Creatinine, Ser: 0.82 mg/dL (ref 0.44–1.00)
GFR, Estimated: >60 mL/min (ref >60)
Glucose, Bld: 108 mg/dL — ABNORMAL HIGH (ref 70–99)
Potassium: 4 mmol/L (ref 3.5–5.1)
Sodium: 138 mmol/L (ref 135–145)

## 2020-08-01 LAB — GLUCOSE, CAPILLARY
Glucose-Capillary: 91 mg/dL (ref 70–99)
Glucose-Capillary: 153 mg/dL — ABNORMAL HIGH (ref 70–99)
Glucose-Capillary: 135 mg/dL — ABNORMAL HIGH (ref 70–99)
Glucose-Capillary: 137 mg/dL — ABNORMAL HIGH (ref 70–99)

## 2020-08-01 LAB — URINE CULTURE: Culture: <10000 — AB

## 2020-08-01 LAB — MAGNESIUM: Magnesium: 2 mg/dL (ref 1.7–2.4)

## 2020-08-01 MED ORDER — SODIUM CHLORIDE 0.9 % IV SOLN: INTRAVENOUS | Status: AC

## 2020-08-01 NOTE — Evaluation (Signed)
Physical Therapy Evaluation Patient Details Name: Kayla Thompson MRN: [id number] DOB: 02-Nov-1935 Today's Date: 08/01/2020   History of Present Illness  Pt is 85 yo female who presented with increased confusion.  Admitted with recurrent UTI likely secondary to ESBL Ecoli on 07/30/20.  Pt with PMH of DM2, HTN, recurrent ESBL UTI  Clinical Impression  Pt admitted with above diagnosis. Pt resides at home with 24 hr caregivers.  She normally requires min A for transfers but ambulates with close supervision and can do community distances.  Today, she required mod A transfers and with ambulation. Limited due to posterior lean and some increased confusion.  Pt with good rehab potential and should be able to d/c home with caregivers when medically ready.  Pt currently with functional limitations due to the deficits listed below (see PT Problem List). Pt will benefit from skilled PT to increase their independence and safety with mobility to allow discharge to the venue listed below.       Follow Up Recommendations Home health PT;Supervision/Assistance - 24 hour    Equipment Recommendations  None recommended by PT (has DME and 24 hr caregivers)    Recommendations for Other Services       Precautions / Restrictions Precautions Precautions: Fall      Mobility  Bed Mobility Overal bed mobility: Needs Assistance Bed Mobility: Supine to Sit     Supine to sit: Mod assist     General bed mobility comments: Aide reports generally only needs a little assist for trunk.  Today requiring increased time, cues for sequence, assist for legs and mod A for trunk    Transfers Overall transfer level: Needs assistance Equipment used: Rolling walker (2 wheeled) Transfers: Sit to/from Stand Sit to Stand: Mod assist         General transfer comment: Increased time and cues for hand placement.  REquired mod A to rise and steady. Generally only needs min A  Ambulation/Gait Ambulation/Gait assistance:  Mod assist Gait Distance (Feet): 6 Feet Assistive device: Rolling walker (2 wheeled) Gait Pattern/deviations: Step-to pattern;Decreased stride length;Narrow base of support Gait velocity: decreased   General Gait Details: Pt with tendency to posterior lean requiring mod A for balance at times.  With very narrow BOS with cues to increase.  REquiring assist for weigh shift and mulimodal cues to advance LE. Declined further ambulation  Stairs            Wheelchair Mobility    Modified Rankin (Stroke Patients Only)       Balance Overall balance assessment: Needs assistance Sitting-balance support: Feet supported;Bilateral upper extremity supported Sitting balance-Leahy Scale: Poor Sitting balance - Comments: requiring UE support Postural control: Posterior lean Standing balance support: Bilateral upper extremity supported Standing balance-Leahy Scale: Poor Standing balance comment: requiring UE support and min-mod A from therapist, tendency to posterior lean                             Pertinent Vitals/Pain Pain Assessment: No/denies pain    Home Living Family/patient expects to be discharged to:: Private residence Living Arrangements: Alone Available Help at Discharge: Family;Personal care attendant;Available 24 hours/day (aides 24/7) Type of Home: House Home Access: Level entry     Home Layout: One [place]: [place] - 2 wheels;Bedside commode;Shower seat;Walker - 4 wheels (adjustable bed) Additional Comments: Pt reports daughter visits few times/week, active with HHPT.    Prior Function Level of Independence: Needs assistance  Gait / Transfers Assistance Needed: Pt reports aides assist her OOB and with transfers, SUPV with ambulation in home and community using rollator  ADL's / Homemaking Assistance Needed: Pt reports home aides complete household chores, Assist with bathing, dressing, toileting due to limited shoulder mobility L>R         Hand Dominance   Dominant Hand: Right    Extremity/Trunk Assessment   Upper Extremity Assessment Upper Extremity Assessment:  (Less than 45 degrees shoulder ROM bilaterally, WFL ROM of elbow, wrist, forearm and fingers. Strength not tested. Overall weakness in upper extremities. THis is baseline)    Lower Extremity Assessment Lower Extremity Assessment: Difficult to assess due to impaired cognition;LLE deficits/detail;RLE deficits/detail RLE Deficits / Details: ROM: WFL but tending to keep knees extended requiring increased cueing and ROM to improve to 90 degrees flexion.  MMT: not following commands, at least 3/5 LLE Deficits / Details: ROM: WFL but tending to keep knees extended requiring increased cueing and ROM to improve to 90 degrees flexion.  MMT: not following commands, at least 3/5    Cervical / Trunk Assessment Cervical / Trunk Assessment: Kyphotic  Communication   Communication: No difficulties  Cognition Arousal/Alertness: Awake/alert Behavior During Therapy: WFL for tasks assessed/performed Overall Cognitive Status: Impaired/Different from baseline Area of Impairment: Orientation;Memory;Awareness;Problem solving                 Orientation Level: Disoriented to;Time;Situation   Memory: Decreased short-term memory     Awareness: Emergent Problem Solving: Slow processing;Decreased initiation;Difficulty sequencing;Requires tactile cues;Requires verbal cues        General Comments General comments (skin integrity, edema, etc.): VSS    Exercises General Exercises - Lower Extremity Ankle Circles/Pumps: AAROM;Both;10 reps;Seated Long Arc Quad: Both;20 reps;AAROM;Seated Other Exercises Other Exercises: knee flexion x 20 AAROM bil seated   Assessment/Plan    PT Assessment Patient needs continued PT services  PT Problem List Decreased strength;Decreased activity tolerance;Decreased balance;Decreased mobility;Pain;Decreased range of motion;Decreased  cognition;Decreased knowledge of use of DME       PT Treatment Interventions DME instruction;Gait training;Functional mobility training;Therapeutic activities;Therapeutic exercise;Balance training;Patient/family education;Manual techniques    PT Goals (Current goals can be found in the Care Plan section)  Acute Rehab PT Goals Patient Stated Goal: return home PT Goal Formulation: With patient Time For Goal Achievement: 08/15/20 Potential to Achieve Goals: Good    Frequency Min 3X/week   Barriers to discharge        Co-evaluation               AM-PAC PT "6 Clicks" Mobility  Outcome Measure Help needed turning from your back to your side while in a flat bed without using bedrails?: A Little Help needed moving from lying on your back to sitting on the side of a flat bed without using bedrails?: A Lot Help needed moving to and from a bed to a chair (including a wheelchair)?: A Little Help needed standing up from a chair using your arms (e.g., wheelchair or bedside chair)?: A Lot Help needed to walk in hospital room?: A Little Help needed climbing 3-5 steps with a railing? : A Lot 6 Click Score: 15    End of Session Equipment Utilized During Treatment: Gait belt Activity Tolerance: Patient tolerated treatment well Patient left: in chair;with call bell/phone within reach;with nursing/sitter in room (pt's personal aide with her and reports an aide will always be in room - did not place alarm) Nurse Communication: Mobility status PT Visit Diagnosis: Other abnormalities of gait  and mobility (R26.89);Difficulty in walking, not elsewhere classified (R26.2)    Time: 3825-0539 PT Time Calculation (min) (ACUTE ONLY): 22 min   Charges:   PT Evaluation $PT Eval Low Complexity: 1 Low          Kahne Helfand, PT Acute Rehab Services Pager [phone] [facility] [phone]    Karlton Lemon 08/01/2020, 4:52 PM

## 2020-08-01 NOTE — Progress Notes (Signed)
Pharmacy Antibiotic Note  Kayla Thompson is a 85 y.o. female admitted on 07/30/2020 with UTI.  Pharmacy has been consulted for Meropenem dosing for history of ESBL UTI (most recently on 07/07/20)  D2 Full Abx with Meropenem Afebrile WBC improved SCr improved UCx: insignificant growth  Plan: Meropenem 1g IV q12h remains appropriate dose per renal function, but with UCx essentially no growth, would recommend change to a cephalosporin or even fosfomycin or could consider d/c'ing the abx's all together if pt without any urinary symptoms any longer  Height: 4\' 11"  (149.9 cm) Weight: 53 kg (116 lb 13.5 oz) IBW/kg (Calculated) : 43.2  Temp (24hrs), Avg:97.9 F (36.6 C), Min:97.6 F (36.4 C), Max:98.1 F (36.7 C)  Recent Labs  Lab 07/30/20 1712 07/31/20 0315 08/01/20 0323  WBC 13.3* 14.3* 11.7*  CREATININE 1.16* 0.98 0.82    Estimated Creatinine Clearance: 38.7 mL/min (by C-G formula based on SCr of 0.82 mg/dL).    Allergies  Allergen Reactions  . Latex Rash  . Penicillins Rash    Has patient had a PCN reaction causing immediate rash, facial/tongue/throat swelling, SOB or lightheadedness with hypotension: No Has patient had a PCN reaction causing severe rash involving mucus membranes or skin necrosis: No Has patient had a PCN reaction that required hospitalization: No Has patient had a PCN reaction occurring within the last 10 years: No If all of the above answers are "NO", then may proceed with Cephalosporin use.      Thank you for allowing pharmacy to be a part of this patient's care.  Adrian Saran, PharmD, BCPS Secure Chat if ?s 08/01/2020 10:45 AM

## 2020-08-01 NOTE — Progress Notes (Signed)
OT Cancellation Note  Patient Details Name: DIAMONDS LIPPARD MRN: [id number] DOB: 13-Dec-1935   Cancelled Treatment:    Reason Eval/Treat Not Completed: OT screened, no needs identified, will sign off. Patient known to therapist. Patient has 24/7 caregivers and has significant assistance with all ADLs at baseline. From an ADL standpoint - OT evaluation not indicated.   Emaleigh Guimond L Kylea Berrong 08/01/2020, 4:03 PM

## 2020-08-01 NOTE — Progress Notes (Signed)
The the patient was seen in our office 2 days ago for presumed urinary tract infection.  Catheterize urine was taken, culture so far has revealed no growth.

## 2020-08-01 NOTE — Progress Notes (Signed)
Initial Nutrition Assessment  DOCUMENTATION CODES:  Not applicable  INTERVENTION:  Continue current diet order.  Continue Ensure Enlive po BID, each supplement provides 350 kcal and 20 grams of protein.  Encourage PO intake.  NUTRITION DIAGNOSIS:  Increased nutrient needs related to acute illness as evidenced by estimated needs.  GOAL:  Patient will meet greater than or equal to 90% of their needs  MONITOR:  PO intake,Supplement acceptance,Labs,Weight trends,Skin,I & O's  REASON FOR ASSESSMENT:  Malnutrition Screening Tool    ASSESSMENT:  85 y.o. female with medical history significant of DM2, HTN, recurrent ESBL UTIs who presents with AMS and UTI d/t ESBL producing E. coli.  Spoke briefly with pt at bedside. Sitter reports that pt was having a "moment" in that she really wanted to go home. Sitter reports that pt ate her breakfast, but has yet to order her lunch because she is unsure what she wants for lunch. RD observed that pt ate 90% of her breakfast tray.  Pt denies any recent weight loss. Pt's weight remains stable since 01/2020. Weight on 4/18 seems questionable as it is 10 kg higher than any other weights. RD to use 53 kg to estimate pt's needs.  On exam, pt had some mild depletions, likely due to age. Otherwise, pt has muscle and fat stores remaining.  Recommend continuing Ensure Enlive BID to add some extra calories and protein.  Medications: reviewed; Vitamin C, SSI, lantus, Synthroid, Mag-Ox, MVI with minerals, mirarlax, prednisone, IVF @ 100 ml/hr, Merrem  Labs: reviewed; CBG 91-260 HbA1c: 5.6% (06/2020)  NUTRITION - FOCUSED PHYSICAL EXAM: Flowsheet Row Most Recent Value  Orbital Region Mild depletion  Upper Arm Region Mild depletion  Thoracic and Lumbar Region No depletion  Buccal Region Mild depletion  Temple Region Mild depletion  Clavicle Bone Region No depletion  Clavicle and Acromion Bone Region No depletion  Scapular Bone Region Unable to assess   Dorsal Hand Moderate depletion  Patellar Region No depletion  Anterior Thigh Region No depletion  Posterior Calf Region No depletion  Edema (RD Assessment) None  Hair Reviewed  Eyes Reviewed  Mouth Reviewed  Skin Reviewed  Nails Reviewed     Diet Order:   Diet Order            Diet Carb Modified Fluid consistency: Thin; Room service appropriate? Yes  Diet effective now                EDUCATION NEEDS:  Education needs have been addressed  Skin:  Skin Assessment: Reviewed RN Assessment (Dry; ecchymosis on BUE)  Last BM:  07/29/20  Height:  Ht Readings from Last 1 Encounters:  07/30/20 4\' 11"  (1.499 m)   Weight:  Wt Readings from Last 1 Encounters:  07/30/20 53 kg   Ideal Body Weight:  44.7 kg  BMI:  Body mass index is 23.6 kg/m.  Estimated Nutritional Needs:  Kcal:  1400-1600 Protein:  65-80 grams Fluid:  >1.4 L  Derrel Nip, RD, LDN Registered Dietitian After Hours/Weekend Pager # in [place]

## 2020-08-01 NOTE — Progress Notes (Signed)
PROGRESS NOTE    [name]  JJK:[id number] DOB: Sep 27, 1935 DOA: 07/30/2020 PCP: [name], MD    Chief Complaint  Patient presents with  . Altered Mental Status  . Urinary Tract Infection    Brief Narrative:  HPI per Dr. Rayburn Felt Kayla Thompson is a 85 y.o. female with medical history significant of DM2, HTN, recurrent ESBL UTIs.  Pt most recently just admitted last month for ESBL UTI.  2 days of carbapenem followed by discharge on cipro according to daughter.  Pt presents to ED with daughter.  Daughter reports increased confusion which is what she usually sees with patients UTIs.  Has 24h home care, ambulates with assistance at baseline.  No CP, cough, N/V/D.   ED Course: UA suggestive of UTI, Tm 99.x, WBC 13k.   Assessment & Plan:   Principal Problem:   UTI due to extended-spectrum beta lactamase (ESBL) producing Escherichia coli Active Problems:   DM2 (diabetes mellitus, type 2) (HCC)   Acute metabolic encephalopathy   Depression   Other chronic pain  1 recurrent UTI-likely secondary to ESBL E. coli due to prior extensive history. -Urine cultures with insignificant growth. -Daughter adamant/concerned about a possible bacteremia and as such blood cultures ordered with no growth to date.  -Patient afebrile.  -Per urology, urine cultures obtained in the outpatient setting negative.  -With normal white count.  -Continue IV Merrem for another 24 hours and if blood cultures remain negative will consider discontinuing antibiotics.  -Supportive care.  -Outpatient follow-up with urology.    2.  Acute metabolic encephalopathy Likely secondary to problem #1 the setting of dehydration -Patient slowly improving.  Not at baseline per caretaker at bedside.  Patient pancultured with blood cultures pending.  Urine cultures with insignificant growth.   -Per urology urine cultures obtained in the outpatient setting with no growth.   -CT head done (05/07/2020)  with generalized cerebral volume loss and moderate chronic small vessel ischemic changes in the cerebral white matter. -TSH obtained 05/07/2020 at 0.681.  Vitamin B12 level 1862 (07/07/2020), folate 34.7 (07/07/2020). -Check an ammonia level. -Continue empiric IV antibiotics for another 24 hours and and blood cultures remain negative may consider discontinuation of IV antibiotics. -Supportive care.   3.  Chronic pain -Continue home regimen Norco.   4.  Well-controlled diabetes mellitus type 2 -Hemoglobin A1c 5.6 (07/07/2020) -CBG 91 this morning.   -Continue Lantus, SSI.   -Follow.  5.  Depression -Continue Cymbalta, Elavil.   6.  GERD -Continue PPI.  7.  Hypothyroidism -Synthroid.     DVT prophylaxis: Lovenox Code Status: DNR Family Communication: Updated patient, no family at bedside. Disposition:   Status is: Inpatient    Dispo: The patient is from: Home              Anticipated d/c is to: Likely home              Patient currently pancultured, urine cultures with insignificant growth, on IV antibiotics, PT evaluation pending.  Not stable for discharge.    Difficult to place patient no       Consultants:   None  Procedures:   Chest x-ray 07/31/2018  Antimicrobials:   IV Merrem 07/30/2020>>>>   Subjective: Laying in bed.  Stated that she is feeling better than she did on admission.  Alert to self and place.  Denies any chest pain.  No shortness of breath.  No abdominal pain.  Some complaints of dysuria however improving.  Caretaker at  bedside.    Objective: Vitals:   07/31/20 2010 07/31/20 2149 08/01/20 0501 08/01/20 0900  BP:  110/75 (!) 141/79   Pulse:  85 96   Resp:  17 16   Temp:  98.1 F (36.7 C) 98 F (36.7 C)   TempSrc:  Oral Oral   SpO2: 97% 98% 99% 97%  Weight:      Height:        Intake/Output Summary (Last 24 hours) at 08/01/2020 1322 Last data filed at 08/01/2020 0502 Gross per 24 hour  Intake 913.6 ml  Output 1000 ml  Net -86.4 ml    Filed Weights   07/30/20 1623  Weight: 53 kg    Examination:  General exam: : NAD Respiratory system: CTA B anterior lung fields.  No wheezes, no rhonchi.  Speaking in full sentences.  Normal respiratory effort. Cardiovascular system: Regular rate and rhythm no murmurs rubs or gallops.  No JVD.  No lower extremity edema.  Gastrointestinal system: Abdomen soft, nontender, nondistended, positive bowel sounds.  No rebound.  No guarding. Central nervous system: Alert and oriented. No focal neurological deficits. Extremities: Symmetric 5 x 5 power. Skin: No rashes, lesions or ulcers Psychiatry: Judgement and insight appear normal. Mood & affect appropriate.   Data Reviewed: I have personally reviewed following labs and imaging studies  CBC: Recent Labs  Lab 07/30/20 1712 07/31/20 0315 08/01/20 0323  WBC 13.3* 14.3* 11.7*  NEUTROABS  --   --  6.7  HGB 11.2* 10.4* 9.6*  HCT 35.2* 32.9* 30.6*  MCV 118.9* 119.2* 117.2*  PLT 316 261 332    Basic Metabolic Panel: Recent Labs  Lab 07/30/20 1712 07/31/20 0315 08/01/20 0323  NA 136 137 138  K 4.8 3.9 4.0  CL 107 110 110  CO2 18* 19* 22  GLUCOSE 175* 139* 108*  BUN 24* 20 20  CREATININE 1.16* 0.98 0.82  CALCIUM 9.2 8.9 8.6*  MG  --   --  2.0    GFR: Estimated Creatinine Clearance: 38.7 mL/min (by C-G formula based on SCr of 0.82 mg/dL).  Liver Function Tests: Recent Labs  Lab 07/30/20 1712  AST 16  ALT 13  ALKPHOS 24*  BILITOT 0.4  PROT 6.4*  ALBUMIN 3.5    CBG: Recent Labs  Lab 08/01/20 0727 08/01/20 1128  GLUCAP 91 153*     Recent Results (from the past 240 hour(s))  Resp Panel by RT-PCR (Flu A&B, Covid) Nasopharyngeal Swab     Status: None   Collection Time: 07/30/20  5:15 PM   Specimen: Nasopharyngeal Swab; Nasopharyngeal(NP) swabs in vial transport medium  Result Value Ref Range Status   SARS Coronavirus 2 by RT PCR NEGATIVE NEGATIVE Final    Comment: (NOTE) SARS-CoV-2 target nucleic acids  are NOT DETECTED.  The SARS-CoV-2 RNA is generally detectable in upper respiratory specimens during the acute phase of infection. The lowest concentration of SARS-CoV-2 viral copies this assay can detect is 138 copies/mL. A negative result does not preclude SARS-Cov-2 infection and should not be used as the sole basis for treatment or other patient management decisions. A negative result may occur with  improper specimen collection/handling, submission of specimen other than nasopharyngeal swab, presence of viral mutation(s) within the areas targeted by this assay, and inadequate number of viral copies(<138 copies/mL). A negative result must be combined with clinical observations, patient history, and epidemiological information. The expected result is Negative.  Fact Sheet for Patients:  EntrepreneurPulse.com.au  Fact Sheet for Healthcare Providers:  IncredibleEmployment.be  This test is no t yet approved or cleared by the [place] and  has been authorized for detection and/or diagnosis of SARS-CoV-2 by FDA under an Emergency Use Authorization (EUA). This EUA will remain  in effect (meaning this test can be used) for the duration of the COVID-19 declaration under Section 564(b)(1) of the Act, 21 U.S.C.section 360bbb-3(b)(1), unless the authorization is terminated  or revoked sooner.       Influenza A by PCR NEGATIVE NEGATIVE Final   Influenza B by PCR NEGATIVE NEGATIVE Final    Comment: (NOTE) The Xpert Xpress SARS-CoV-2/FLU/RSV plus assay is intended as an aid in the diagnosis of influenza from Nasopharyngeal swab specimens and should not be used as a sole basis for treatment. Nasal washings and aspirates are unacceptable for Xpert Xpress SARS-CoV-2/FLU/RSV testing.  Fact Sheet for Patients: EntrepreneurPulse.com.au  Fact Sheet for Healthcare Providers: IncredibleEmployment.be  This test is  not yet approved or cleared by the [place] FDA and has been authorized for detection and/or diagnosis of SARS-CoV-2 by FDA under an Emergency Use Authorization (EUA). This EUA will remain in effect (meaning this test can be used) for the duration of the COVID-19 declaration under Section 564(b)(1) of the Act, 21 U.S.C. section 360bbb-3(b)(1), unless the authorization is terminated or revoked.  Performed at [facility], [place] [address] [place], [place] [address]   Urine Culture     Status: Abnormal   Collection Time: 07/30/20  7:00 PM   Specimen: Urine, Catheterized  Result Value Ref Range Status   Specimen Description   Final    URINE, CATHETERIZED Performed at [place] [address], [place], [place] [address]    Special Requests   Final    NONE Performed at [facility], [place] [address], [place], [place] [address]    Culture (A)  Final    <10,000 COLONIES/mL INSIGNIFICANT GROWTH Performed at [place] [address] [place],  [address]    Report Status 08/01/2020 FINAL  Final         Radiology Studies: DG Chest Port 1 View  Result Date: 07/30/2020 CLINICAL DATA:  Weakness with confusion EXAM: PORTABLE CHEST 1 VIEW COMPARISON:  July 07, 2020 FINDINGS: No edema or airspace opacity. Heart size and pulmonary vascularity are normal. No adenopathy. Old healed fracture posterior right seventh rib, stable. Degenerative change noted in each shoulder. IMPRESSION: No edema or airspace opacity. Cardiac silhouette within normal limits. Electronically Signed   By: Lowella Grip III M.D.   On: 07/30/2020 18:19        Scheduled Meds: . albuterol  2.5 mg Nebulization TID  . amitriptyline  10 mg Oral QHS  . vitamin C  1,000 mg Oral BID  . baclofen  10 mg Oral Daily  . DULoxetine  90 mg Oral Daily  . enoxaparin (LOVENOX) injection  30 mg Subcutaneous Q24H  . estradiol  1 Applicatorful Vaginal  Once per day on Mon Thu  . fluticasone furoate-vilanterol  1 puff Inhalation Daily   And  . umeclidinium bromide  1 puff Inhalation Daily  . guaiFENesin  1,200 mg Oral BID  . HYDROcodone-acetaminophen  1 tablet Oral Q4H  . insulin aspart  0-9 Units Subcutaneous TID WC  . insulin glargine  8 Units Subcutaneous Daily  . levothyroxine  50 mcg Oral QAC breakfast  . magnesium oxide  400 mg Oral QHS  . montelukast  10 mg Oral QHS  . multivitamin with  minerals  1 tablet Oral Daily  . pantoprazole  40 mg Oral BID  . polyethylene glycol  17 g Oral Daily  . predniSONE  1 mg Oral BID WC  . pseudoephedrine  120 mg Oral BID  . rosuvastatin  10 mg Oral QPM   Continuous Infusions: . sodium chloride 100 mL/hr at 08/01/20 0848  . meropenem (MERREM) IV 1 g (08/01/20 0849)     LOS: 2 days    Time spent: 35 minutes    Irine Seal, MD [facility]   To contact the attending provider between 7A-7P or the covering provider during after hours 7P-7A, please log into the web site [email or web address] and access using universal [place] password for that web site. If you do not have the password, please call the hospital operator.  08/01/2020, 1:22 PM

## 2020-08-02 LAB — CBC WITH DIFFERENTIAL/PLATELET
Abs Immature Granulocytes: 0.09 10*3/uL — ABNORMAL HIGH (ref 0.00–0.07)
Basophils Absolute: 0 10*3/uL (ref 0.0–0.1)
Basophils Relative: 0 %
Eosinophils Absolute: 0.4 10*3/uL (ref 0.0–0.5)
Eosinophils Relative: 4 %
HCT: 32.6 % — ABNORMAL LOW (ref 36.0–46.0)
Hemoglobin: 10.2 g/dL — ABNORMAL LOW (ref 12.0–15.0)
Immature Granulocytes: 1 %
Lymphocytes Relative: 38 %
Lymphs Abs: 3.7 10*3/uL (ref 0.7–4.0)
MCH: 37.5 pg — ABNORMAL HIGH (ref 26.0–34.0)
MCHC: 31.3 g/dL (ref 30.0–36.0)
MCV: 119.9 fL — ABNORMAL HIGH (ref 80.0–100.0)
Monocytes Absolute: 0.5 10*3/uL (ref 0.1–1.0)
Monocytes Relative: 5 %
Neutro Abs: 4.9 10*3/uL (ref 1.7–7.7)
Neutrophils Relative %: 52 %
Platelets: 226 10*3/uL (ref 150–400)
RBC: 2.72 MIL/uL — ABNORMAL LOW (ref 3.87–5.11)
RDW: 14.8 % (ref 11.5–15.5)
WBC: 9.6 10*3/uL (ref 4.0–10.5)
nRBC: 0 % (ref 0.0–0.2)

## 2020-08-02 LAB — BASIC METABOLIC PANEL
Anion gap: 7 (ref 5–15)
BUN: 16 mg/dL (ref 8–23)
CO2: 22 mmol/L (ref 22–32)
Calcium: 8.1 mg/dL — ABNORMAL LOW (ref 8.9–10.3)
Chloride: 111 mmol/L (ref 98–111)
Creatinine, Ser: 0.76 mg/dL (ref 0.44–1.00)
GFR, Estimated: >60 mL/min (ref >60)
Glucose, Bld: 111 mg/dL — ABNORMAL HIGH (ref 70–99)
Potassium: 3.9 mmol/L (ref 3.5–5.1)
Sodium: 140 mmol/L (ref 135–145)

## 2020-08-02 LAB — GLUCOSE, CAPILLARY
Glucose-Capillary: 81 mg/dL (ref 70–99)
Glucose-Capillary: 122 mg/dL — ABNORMAL HIGH (ref 70–99)
Glucose-Capillary: 130 mg/dL — ABNORMAL HIGH (ref 70–99)
Glucose-Capillary: 122 mg/dL — ABNORMAL HIGH (ref 70–99)

## 2020-08-02 LAB — AMMONIA: Ammonia: 27 umol/L (ref 9–35)

## 2020-08-02 NOTE — Progress Notes (Addendum)
PROGRESS NOTE    Kayla Thompson  NWG:[id number] DOB: 1935-10-01 DOA: 07/30/2020 PCP: [name], MD    Chief Complaint  Patient presents with  . Altered Mental Status  . Urinary Tract Infection    Brief Narrative:  HPI per Dr. Rayburn Felt Kayla Thompson is a 85 y.o. female with medical history significant of DM2, HTN, recurrent ESBL UTIs.  Pt most recently just admitted last month for ESBL UTI.  2 days of carbapenem followed by discharge on cipro according to daughter.  Pt presents to ED with daughter.  Daughter reports increased confusion which is what she usually sees with patients UTIs.  Has 24h home care, ambulates with assistance at baseline.  No CP, cough, N/V/D.   ED Course: UA suggestive of UTI, Tm 99.x, WBC 13k.   Assessment & Plan:   Principal Problem:   UTI due to extended-spectrum beta lactamase (ESBL) producing Escherichia coli Active Problems:   DM2 (diabetes mellitus, type 2) (HCC)   Acute metabolic encephalopathy   Depression   Other chronic pain  1 recurrent UTI-likely secondary to ESBL E. coli due to prior extensive history. -Urine cultures with insignificant growth. -Daughter was adamant/concerned about a possible bacteremia and as such blood cultures ordered with no growth to date.  -Patient currently afebrile.  -Per urology, urine cultures obtained in the outpatient setting negative.  -Leukocytosis improving and normalized. -Continue IV Merrem for the next 24 to 48 hours and if blood cultures continue remain negative will discontinue antibiotics. -Supportive care.  -Outpatient follow-up with urology.    2.  Acute metabolic encephalopathy Likely secondary to problem #1 in the setting of dehydration.??  Possible underlying dementia.  Patient with good long-term memory. -Patient improving clinically..  Not at baseline per caretaker at bedside.  Patient pancultured with blood cultures pending.  Urine cultures with insignificant growth.    -Per urology urine cultures obtained in the outpatient setting with no growth.   -CT head done (05/07/2020) with generalized cerebral volume loss and moderate chronic small vessel ischemic changes in the cerebral white matter. -TSH obtained 05/07/2020 at 0.681.  Vitamin B12 level 1862 (07/07/2020), folate 34.7 (07/07/2020). -Ammonia level currently at 27. -Continue empiric IV antibiotics for the next 24 to 48 hours, and if blood cultures continue to remain negative will discontinue IV antibiotics. -Continue supportive care.   3.  Chronic pain -Continue home regimen Norco.   4.  Well-controlled diabetes mellitus type 2 -Hemoglobin A1c 5.6 (07/07/2020) -CBG at 81 this morning.   -Lantus, SSI.   -Follow.   5.  Depression -Continue Cymbalta, Elavil.   6.  GERD -PPI.Marland Kitchen  7.  Hypothyroidism -Continue Synthroid.      DVT prophylaxis: Lovenox Code Status: DNR Family Communication: Updated patient, updated caretaker at bedside, updated daughter [name] on telephone. Disposition:   Status is: Inpatient    Dispo: The patient is from: Home              Anticipated d/c is to: Likely home with home health therapies.              Patient currently pancultured, urine cultures with insignificant growth, on IV antibiotics, PT evaluation.  Not stable for discharge.    Difficult to place patient no       Consultants:   None  Procedures:   Chest x-ray 07/31/2018  Antimicrobials:   IV Merrem 07/30/2020>>>>   Subjective: Sitting up in bed.  More alert.  Seems more cheerful.  States she is feeling  much better.  Denies any dysuria.  No chest pain.  No shortness of breath.  No abdominal pain.  Caretaker at bedside.   Objective: Vitals:   08/01/20 2152 08/02/20 0503 08/02/20 0742 08/02/20 1343  BP: 130/70 (!) 137/59  128/68  Pulse: 96 80  93  Resp: 16 16  18   Temp: 98.9 F (37.2 C) 97.9 F (36.6 C)  98.6 F (37 C)  TempSrc: Oral Oral  Oral  SpO2: 100% 100% 97% 100%  Weight:       Height:        Intake/Output Summary (Last 24 hours) at 08/02/2020 1734 Last data filed at 08/02/2020 1351 Gross per 24 hour  Intake 1764.95 ml  Output 2500 ml  Net -735.05 ml   Filed Weights   07/30/20 1623  Weight: 53 kg    Examination:  General exam: : NAD Respiratory system: CTA B anterior lung fields.  No wheezes, no rhonchi.  Speaking in full sentences.  Normal respiratory effort. Cardiovascular system: Regular rate and rhythm no murmurs rubs or gallops.  No JVD.  No lower extremity edema.  Gastrointestinal system: Abdomen soft, nontender, nondistended, positive bowel sounds.  No rebound.  No guarding. Central nervous system: Alert and oriented to self and place. No focal neurological deficits. Extremities: Symmetric 5 x 5 power. Skin: No rashes, lesions or ulcers Psychiatry: Judgement and insight appear poor to fair. Mood & affect appropriate.   Data Reviewed: I have personally reviewed following labs and imaging studies  CBC: Recent Labs  Lab 07/30/20 1712 07/31/20 0315 08/01/20 0323 08/02/20 0248  WBC 13.3* 14.3* 11.7* 9.6  NEUTROABS  --   --  6.7 4.9  HGB 11.2* 10.4* 9.6* 10.2*  HCT 35.2* 32.9* 30.6* 32.6*  MCV 118.9* 119.2* 117.2* 119.9*  PLT 316 261 254 810    Basic Metabolic Panel: Recent Labs  Lab 07/30/20 1712 07/31/20 0315 08/01/20 0323 08/02/20 0248  NA 136 137 138 140  K 4.8 3.9 4.0 3.9  CL 107 110 110 111  CO2 18* 19* 22 22  GLUCOSE 175* 139* 108* 111*  BUN 24* 20 20 16   CREATININE 1.16* 0.98 0.82 0.76  CALCIUM 9.2 8.9 8.6* 8.1*  MG  --   --  2.0  --     GFR: Estimated Creatinine Clearance: 39.6 mL/min (by C-G formula based on SCr of 0.76 mg/dL).  Liver Function Tests: Recent Labs  Lab 07/30/20 1712  AST 16  ALT 13  ALKPHOS 24*  BILITOT 0.4  PROT 6.4*  ALBUMIN 3.5    CBG: Recent Labs  Lab 08/01/20 1128 08/01/20 1737 08/01/20 2012 08/02/20 0737 08/02/20 1145  GLUCAP 153* 135* 137* 81 122*     Recent Results  (from the past 240 hour(s))  Resp Panel by RT-PCR (Flu A&B, Covid) Nasopharyngeal Swab     Status: None   Collection Time: 07/30/20  5:15 PM   Specimen: Nasopharyngeal Swab; Nasopharyngeal(NP) swabs in vial transport medium  Result Value Ref Range Status   SARS Coronavirus 2 by RT PCR NEGATIVE NEGATIVE Final    Comment: (NOTE) SARS-CoV-2 target nucleic acids are NOT DETECTED.  The SARS-CoV-2 RNA is generally detectable in upper respiratory specimens during the acute phase of infection. The lowest concentration of SARS-CoV-2 viral copies this assay can detect is 138 copies/mL. A negative result does not preclude SARS-Cov-2 infection and should not be used as the sole basis for treatment or other patient management decisions. A negative result may occur with  improper specimen  collection/handling, submission of specimen other than nasopharyngeal swab, presence of viral mutation(s) within the areas targeted by this assay, and inadequate number of viral copies(<138 copies/mL). A negative result must be combined with clinical observations, patient history, and epidemiological information. The expected result is Negative.  Fact Sheet for Patients:  EntrepreneurPulse.com.au  Fact Sheet for Healthcare Providers:  IncredibleEmployment.be  This test is no t yet approved or cleared by the [place] FDA and  has been authorized for detection and/or diagnosis of SARS-CoV-2 by FDA under an Emergency Use Authorization (EUA). This EUA will remain  in effect (meaning this test can be used) for the duration of the COVID-19 declaration under Section 564(b)(1) of the Act, 21 U.S.C.section 360bbb-3(b)(1), unless the authorization is terminated  or revoked sooner.       Influenza A by PCR NEGATIVE NEGATIVE Final   Influenza B by PCR NEGATIVE NEGATIVE Final    Comment: (NOTE) The Xpert Xpress SARS-CoV-2/FLU/RSV plus assay is intended as an aid in the  diagnosis of influenza from Nasopharyngeal swab specimens and should not be used as a sole basis for treatment. Nasal washings and aspirates are unacceptable for Xpert Xpress SARS-CoV-2/FLU/RSV testing.  Fact Sheet for Patients: EntrepreneurPulse.com.au  Fact Sheet for Healthcare Providers: IncredibleEmployment.be  This test is not yet approved or cleared by the [place] FDA and has been authorized for detection and/or diagnosis of SARS-CoV-2 by FDA under an Emergency Use Authorization (EUA). This EUA will remain in effect (meaning this test can be used) for the duration of the COVID-19 declaration under Section 564(b)(1) of the Act, 21 U.S.C. section 360bbb-3(b)(1), unless the authorization is terminated or revoked.  Performed at [facility], [place] [address] [place], [place] [address]   Urine Culture     Status: Abnormal   Collection Time: 07/30/20  7:00 PM   Specimen: Urine, Catheterized  Result Value Ref Range Status   Specimen Description   Final    URINE, CATHETERIZED Performed at [facility] [address], [place], [place] [address]    Special Requests   Final    NONE Performed at [facility], [place] [address] [place], [place] [address]    Culture (A)  Final    <10,000 COLONIES/mL INSIGNIFICANT GROWTH Performed at [facility] [address], [place], [place] [address]    Report Status 08/01/2020 FINAL  Final  Culture, blood (Routine X 2) w Reflex to ID Panel     Status: None (Preliminary result)   Collection Time: 07/31/20  2:24 PM   Specimen: BLOOD RIGHT HAND  Result Value Ref Range Status   Specimen Description   Final    BLOOD RIGHT HAND Performed at [facility] [address] [place], [place] [address]    Special Requests   Final    BOTTLES DRAWN AEROBIC ONLY Blood Culture adequate volume Performed at [facility] [address], [place], [place] [address]    Culture   Final    NO GROWTH 2 DAYS Performed at [facility] [address], [place], [place] [address]    Report Status PENDING  Incomplete  Culture, blood (Routine X 2) w Reflex to ID Panel     Status: None (Preliminary result)   Collection Time: 07/31/20  2:24 PM   Specimen: BLOOD LEFT HAND  Result Value Ref Range Status   Specimen Description   Final    BLOOD LEFT HAND Performed at [facility]  [place] [address] [place], [place] [address]    Special Requests   Final    BOTTLES DRAWN AEROBIC AND ANAEROBIC Blood Culture adequate volume Performed at [facility] [address] [place], [place] [address]    Culture   Final    NO GROWTH 2 DAYS Performed at [facility] [address] [place],  [address]    Report Status PENDING  Incomplete         Radiology Studies: No results found.      Scheduled Meds: . albuterol  2.5 mg Nebulization TID  . amitriptyline  10 mg Oral QHS  . vitamin C  1,000 mg Oral BID  . baclofen  10 mg Oral Daily  . DULoxetine  90 mg Oral Daily  . enoxaparin (LOVENOX) injection  30 mg Subcutaneous Q24H  . estradiol  1 Applicatorful Vaginal Once per day on Mon Thu  . fluticasone furoate-vilanterol  1 puff Inhalation Daily   And  . umeclidinium bromide  1 puff Inhalation Daily  . guaiFENesin  1,200 mg Oral BID  . HYDROcodone-acetaminophen  1 tablet Oral Q4H  . insulin aspart  0-9 Units Subcutaneous TID WC  . insulin glargine  8 Units Subcutaneous Daily  . levothyroxine  50 mcg Oral QAC breakfast  . magnesium oxide  400 mg Oral QHS  . montelukast  10 mg Oral QHS  . multivitamin with minerals  1 tablet Oral Daily  . pantoprazole  40 mg Oral BID  . polyethylene glycol  17 g Oral Daily  . predniSONE  1 mg Oral BID WC  . pseudoephedrine  120 mg Oral BID  . rosuvastatin  10 mg Oral QPM   Continuous Infusions: . meropenem  (MERREM) IV 1 g (08/02/20 0944)     LOS: 3 days    Time spent: 35 minutes    Irine Seal, MD [facility]   To contact the attending provider between 7A-7P or the covering provider during after hours 7P-7A, please log into the web site [email or web address] and access using universal [place] password for that web site. If you do not have the password, please call the hospital operator.  08/02/2020, 5:34 PM

## 2020-08-03 LAB — BASIC METABOLIC PANEL
Anion gap: 7 (ref 5–15)
BUN: 18 mg/dL (ref 8–23)
CO2: 23 mmol/L (ref 22–32)
Calcium: 8.1 mg/dL — ABNORMAL LOW (ref 8.9–10.3)
Chloride: 108 mmol/L (ref 98–111)
Creatinine, Ser: 0.82 mg/dL (ref 0.44–1.00)
GFR, Estimated: >60 mL/min (ref >60)
Glucose, Bld: 130 mg/dL — ABNORMAL HIGH (ref 70–99)
Potassium: 3.9 mmol/L (ref 3.5–5.1)
Sodium: 138 mmol/L (ref 135–145)

## 2020-08-03 LAB — GLUCOSE, CAPILLARY
Glucose-Capillary: 119 mg/dL — ABNORMAL HIGH (ref 70–99)
Glucose-Capillary: 178 mg/dL — ABNORMAL HIGH (ref 70–99)
Glucose-Capillary: 179 mg/dL — ABNORMAL HIGH (ref 70–99)
Glucose-Capillary: 171 mg/dL — ABNORMAL HIGH (ref 70–99)

## 2020-08-03 MED ORDER — ALBUTEROL SULFATE (2.5 MG/3ML) 0.083% IN NEBU
2.5000 mg | INHALATION_SOLUTION | Freq: Two times a day (BID) | RESPIRATORY_TRACT | Status: DC
  Administered 2020-08-03 – 2020-08-04 (×2): 2.5 mg via RESPIRATORY_TRACT
  Filled 2020-08-03 (×2): qty 3

## 2020-08-03 NOTE — Progress Notes (Signed)
PROGRESS NOTE    Kayla Thompson  NID:[id number] DOB: 1935-10-08 DOA: 07/30/2020 PCP: [name], MD    Chief Complaint  Patient presents with  . Altered Mental Status  . Urinary Tract Infection    Brief Narrative:  HPI per Dr. Rayburn Felt Kayla Thompson is a 85 y.o. female with medical history significant of DM2, HTN, recurrent ESBL UTIs.  Pt most recently just admitted last month for ESBL UTI.  2 days of carbapenem followed by discharge on cipro according to daughter.  Pt presents to ED with daughter.  Daughter reports increased confusion which is what she usually sees with patients UTIs.  Has 24h home care, ambulates with assistance at baseline.  No CP, cough, N/V/D.   ED Course: UA suggestive of UTI, Tm 99.x, WBC 13k.   Assessment & Plan:   Principal Problem:   UTI due to extended-spectrum beta lactamase (ESBL) producing Escherichia coli Active Problems:   DM2 (diabetes mellitus, type 2) (HCC)   Acute metabolic encephalopathy   Depression   Other chronic pain  1 recurrent UTI-felt likely secondary to ESBL E. coli due to prior extensive history. -Urine cultures with insignificant growth. -Daughter was adamant/concerned about a possible bacteremia and as such blood cultures ordered with no growth to date.  -Patient currently afebrile.  -Per urology, urine cultures obtained in the outpatient setting negative.  -Leukocytosis improved and normalized.   -Patient improving clinically  -Continue IV Merrem for another 24 hours and if blood cultures remain negative will discontinue antibiotics  -Supportive care  -Outpatient follow-up with urology.    2.  Acute metabolic encephalopathy Likely secondary to problem #1 in the setting of dehydration.??  Possible underlying dementia.  Patient with good long-term memory. -Patient with clinical improvement however not at baseline per caretaker.   -Patient pancultured, results pending.   -Urine culture with  insignificant growth.   -Per urology urine cultures obtained in the outpatient setting with no growth.   -CT head done (05/07/2020) with generalized cerebral volume loss and moderate chronic small vessel ischemic changes in the cerebral white matter. -TSH obtained 05/07/2020 at 0.681.  Vitamin B12 level 1862 (07/07/2020), folate 34.7 (07/07/2020). -Ammonia level currently at 27. -Continue empiric IV antibiotics through today and if blood cultures remain negative tomorrow we will discontinue IV antibiotics.   -SLP for cognitive evaluation.   -Supportive care.   3.  Chronic pain -Controlled on home regimen of Norco.  4.  Well-controlled diabetes mellitus type 2 -Hemoglobin A1c 5.6 (07/07/2020) -CBG 119 this morning.   -Continue Lantus, SSI.   -Follow.  5.  Depression -Elavil, Cymbalta.   6.  GERD -Continue PPI.   7.  Hypothyroidism -Synthroid.     DVT prophylaxis: Lovenox Code Status: DNR Family Communication: Updated patient, updated caretaker at bedside. Disposition:   Status is: Inpatient    Dispo: The patient is from: Home              Anticipated d/c is to: Likely home with home health therapies.              Patient currently pancultured, urine cultures with insignificant growth, on IV antibiotics, PT evaluation.  Not stable for discharge.    Difficult to place patient no       Consultants:   None  Procedures:   Chest x-ray 07/31/2018  Antimicrobials:   IV Merrem 07/30/2020>>>>   Subjective: Sitting up in bed eating lunch.  Caretaker at bedside.  Patient states she is feeling better.  More alert.  Denies any chest pain.  No shortness of breath.  No abdominal pain.    Objective: Vitals:   08/02/20 1941 08/02/20 2110 08/03/20 0608 08/03/20 0834  BP:  (!) 114/50 130/69   Pulse:  100 82   Resp:  15 15   Temp:  98.7 F (37.1 C) 98 F (36.7 C)   TempSrc:      SpO2: 98% 97% 98% 97%  Weight:      Height:        Intake/Output Summary (Last 24 hours)  at 08/03/2020 1325 Last data filed at 08/03/2020 1000 Gross per 24 hour  Intake 920 ml  Output 2000 ml  Net -1080 ml   Filed Weights   07/30/20 1623  Weight: 53 kg    Examination:  General exam: : NAD.  Frail. Respiratory system: CTA B anterior lung fields.  No wheezes, no rhonchi.  Speaking in full sentences.  Normal respiratory effort. Cardiovascular system: Regular rate and rhythm no murmurs rubs or gallops.  No JVD.  No lower extremity edema.  Gastrointestinal system: Abdomen soft, nontender, nondistended, positive bowel sounds.  No rebound.  No guarding. Central nervous system: Alert and oriented. No focal neurological deficits. Extremities: Symmetric 5 x 5 power. Skin: No rashes, lesions or ulcers Psychiatry: Judgement and insight appear normal. Mood & affect appropriate.  Data Reviewed: I have personally reviewed following labs and imaging studies  CBC: Recent Labs  Lab 07/30/20 1712 07/31/20 0315 08/01/20 0323 08/02/20 0248  WBC 13.3* 14.3* 11.7* 9.6  NEUTROABS  --   --  6.7 4.9  HGB 11.2* 10.4* 9.6* 10.2*  HCT 35.2* 32.9* 30.6* 32.6*  MCV 118.9* 119.2* 117.2* 119.9*  PLT 316 261 254 299    Basic Metabolic Panel: Recent Labs  Lab 07/30/20 1712 07/31/20 0315 08/01/20 0323 08/02/20 0248 08/03/20 0403  NA 136 137 138 140 138  K 4.8 3.9 4.0 3.9 3.9  CL 107 110 110 111 108  CO2 18* 19* 22 22 23   GLUCOSE 175* 139* 108* 111* 130*  BUN 24* 20 20 16 18   CREATININE 1.16* 0.98 0.82 0.76 0.82  CALCIUM 9.2 8.9 8.6* 8.1* 8.1*  MG  --   --  2.0  --   --     GFR: Estimated Creatinine Clearance: 38.7 mL/min (by C-G formula based on SCr of 0.82 mg/dL).  Liver Function Tests: Recent Labs  Lab 07/30/20 1712  AST 16  ALT 13  ALKPHOS 24*  BILITOT 0.4  PROT 6.4*  ALBUMIN 3.5    CBG: Recent Labs  Lab 08/02/20 1145 08/02/20 1739 08/02/20 2113 08/03/20 0801 08/03/20 1139  GLUCAP 122* 130* 122* 119* 178*     Recent Results (from the past 240 hour(s))   Resp Panel by RT-PCR (Flu A&B, Covid) Nasopharyngeal Swab     Status: None   Collection Time: 07/30/20  5:15 PM   Specimen: Nasopharyngeal Swab; Nasopharyngeal(NP) swabs in vial transport medium  Result Value Ref Range Status   SARS Coronavirus 2 by RT PCR NEGATIVE NEGATIVE Final    Comment: (NOTE) SARS-CoV-2 target nucleic acids are NOT DETECTED.  The SARS-CoV-2 RNA is generally detectable in upper respiratory specimens during the acute phase of infection. The lowest concentration of SARS-CoV-2 viral copies this assay can detect is 138 copies/mL. A negative result does not preclude SARS-Cov-2 infection and should not be used as the sole basis for treatment or other patient management decisions. A negative result may occur with  improper specimen collection/handling,  submission of specimen other than nasopharyngeal swab, presence of viral mutation(s) within the areas targeted by this assay, and inadequate number of viral copies(<138 copies/mL). A negative result must be combined with clinical observations, patient history, and epidemiological information. The expected result is Negative.  Fact Sheet for Patients:  EntrepreneurPulse.com.au  Fact Sheet for Healthcare Providers:  IncredibleEmployment.be  This test is no t yet approved or cleared by the [place] FDA and  has been authorized for detection and/or diagnosis of SARS-CoV-2 by FDA under an Emergency Use Authorization (EUA). This EUA will remain  in effect (meaning this test can be used) for the duration of the COVID-19 declaration under Section 564(b)(1) of the Act, 21 U.S.C.section 360bbb-3(b)(1), unless the authorization is terminated  or revoked sooner.       Influenza A by PCR NEGATIVE NEGATIVE Final   Influenza B by PCR NEGATIVE NEGATIVE Final    Comment: (NOTE) The Xpert Xpress SARS-CoV-2/FLU/RSV plus assay is intended as an aid in the diagnosis of influenza from  Nasopharyngeal swab specimens and should not be used as a sole basis for treatment. Nasal washings and aspirates are unacceptable for Xpert Xpress SARS-CoV-2/FLU/RSV testing.  Fact Sheet for Patients: EntrepreneurPulse.com.au  Fact Sheet for Healthcare Providers: IncredibleEmployment.be  This test is not yet approved or cleared by the [place] FDA and has been authorized for detection and/or diagnosis of SARS-CoV-2 by FDA under an Emergency Use Authorization (EUA). This EUA will remain in effect (meaning this test can be used) for the duration of the COVID-19 declaration under Section 564(b)(1) of the Act, 21 U.S.C. section 360bbb-3(b)(1), unless the authorization is terminated or revoked.  Performed at [facility], [place] [address] [place], [place] [address]   Urine Culture     Status: Abnormal   Collection Time: 07/30/20  7:00 PM   Specimen: Urine, Catheterized  Result Value Ref Range Status   Specimen Description   Final    URINE, CATHETERIZED Performed at [place] [address] [place], [place] [address]    Special Requests   Final    NONE Performed at [facility], [place] [address] [place], [place] [address]    Culture (A)  Final    <10,000 COLONIES/mL INSIGNIFICANT GROWTH Performed at [place] [address], [place], [place] [address]    Report Status 08/01/2020 FINAL  Final  Culture, blood (Routine X 2) w Reflex to ID Panel     Status: None (Preliminary result)   Collection Time: 07/31/20  2:24 PM   Specimen: BLOOD RIGHT HAND  Result Value Ref Range Status   Specimen Description   Final    BLOOD RIGHT HAND Performed at [place] [address] [place], [place] [address]    Special Requests   Final    BOTTLES DRAWN AEROBIC ONLY Blood Culture adequate volume Performed at [facility] [facility] [address]  [place], [place] [address]    Culture   Final    NO GROWTH 3 DAYS Performed at [facility], [place] [address], [place], [place] [address]    Report Status PENDING  Incomplete  Culture, blood (Routine X 2) w Reflex to ID Panel     Status: None (Preliminary result)   Collection Time: 07/31/20  2:24 PM   Specimen: BLOOD LEFT HAND  Result Value Ref Range Status   Specimen Description   Final    BLOOD LEFT HAND Performed at [facility], [place] [address], [place], [place] [address]    Special Requests   Final    BOTTLES DRAWN AEROBIC AND ANAEROBIC Blood Culture adequate volume Performed at [facility] [address], [place], [place] [address]    Culture   Final    NO GROWTH 3 DAYS Performed at [facility], [place] [address] [place], [place] [address]    Report Status PENDING  Incomplete         Radiology Studies: No results found.      Scheduled Meds: . albuterol  2.5 mg Nebulization BID  . amitriptyline  10 mg Oral QHS  . vitamin C  1,000 mg Oral BID  . baclofen  10 mg Oral Daily  . DULoxetine  90 mg Oral Daily  . enoxaparin (LOVENOX) injection  30 mg Subcutaneous Q24H  . estradiol  1 Applicatorful Vaginal Once per day on Mon Thu  . fluticasone furoate-vilanterol  1 puff Inhalation Daily   And  . umeclidinium bromide  1 puff Inhalation Daily  . guaiFENesin  1,200 mg Oral BID  . HYDROcodone-acetaminophen  1 tablet Oral Q4H  . insulin aspart  0-9 Units Subcutaneous TID WC  . insulin glargine  8 Units Subcutaneous Daily  . levothyroxine  50 mcg Oral QAC breakfast  . magnesium oxide  400 mg Oral QHS  . montelukast  10 mg Oral QHS  . multivitamin with minerals  1 tablet Oral Daily  . pantoprazole  40 mg Oral BID  . polyethylene glycol  17 g Oral Daily  . predniSONE  1 mg Oral BID WC  . pseudoephedrine  120 mg Oral BID  . rosuvastatin  10 mg Oral QPM   Continuous Infusions: . meropenem (MERREM) IV 1 g (08/03/20 0950)      LOS: 4 days    Time spent: 35 minutes    Irine Seal, MD [facility]   To contact the attending provider between 7A-7P or the covering provider during after hours 7P-7A, please log into the web site [email or web address] and access using universal [place] password for that web site. If you do not have the password, please call the hospital operator.  08/03/2020, 1:25 PM

## 2020-08-03 NOTE — Plan of Care (Signed)
  Problem: Coping: Goal: Level of anxiety will decrease Outcome: Progressing   Problem: Safety: Goal: Ability to remain free from injury will improve Outcome: Progressing   Problem: Skin Integrity: Goal: Risk for impaired skin integrity will decrease Outcome: Progressing   

## 2020-08-04 DIAGNOSIS — N390 Urinary tract infection, site not specified: Secondary | ICD-10-CM

## 2020-08-04 LAB — BASIC METABOLIC PANEL
Anion gap: 6 (ref 5–15)
BUN: 18 mg/dL (ref 8–23)
CO2: 27 mmol/L (ref 22–32)
Calcium: 8.7 mg/dL — ABNORMAL LOW (ref 8.9–10.3)
Chloride: 105 mmol/L (ref 98–111)
Creatinine, Ser: 0.63 mg/dL (ref 0.44–1.00)
GFR, Estimated: >60 mL/min (ref >60)
Glucose, Bld: 135 mg/dL — ABNORMAL HIGH (ref 70–99)
Potassium: 4 mmol/L (ref 3.5–5.1)
Sodium: 138 mmol/L (ref 135–145)

## 2020-08-04 LAB — GLUCOSE, CAPILLARY
Glucose-Capillary: 156 mg/dL — ABNORMAL HIGH (ref 70–99)
Glucose-Capillary: 168 mg/dL — ABNORMAL HIGH (ref 70–99)

## 2020-08-04 MED ORDER — ACETAMINOPHEN 325 MG PO TABS: 650.0000 mg | ORAL_TABLET | Freq: Four times a day (QID) | ORAL | Status: DC | PRN

## 2020-08-04 NOTE — Discharge Summary (Signed)
Physician Discharge Summary  ZAVANNAH DEBLOIS ELF:[id number] DOB: [birth year] DOA: 07/30/2020  PCP: [name], MD  Admit date: 07/30/2020 Discharge date: 08/04/2020  Time spent: 55 minutes  Recommendations for Outpatient Follow-up:  1. Follow-up with Dr. Diona Fanti as previously scheduled. 2. Follow-up with [name], MD in 2 weeks.  On follow-up patient's mild cognitive impairment will need to be followed up upon.  Patient will need a basic metabolic profile done to follow-up on electrolytes and renal function.   Discharge Diagnoses:  Principal Problem:   UTI due to extended-spectrum beta lactamase (ESBL) producing Escherichia coli Active Problems:   DM2 (diabetes mellitus, type 2) (HCC)   Acute metabolic encephalopathy   Depression   Other chronic pain   Urinary tract infection without hematuria   Discharge Condition: Stable and improved  Diet recommendation: Carb modified diet  Filed Weights   07/30/20 1623  Weight: 53 kg    History of present illness:  HPI per Dr. Rayburn Felt Kayla Thompson is a 85 y.o. female with medical history significant of DM2, HTN, recurrent ESBL UTIs.  Pt most recently just admitted last month for ESBL UTI.  2 days of carbapenem followed by discharge on cipro according to daughter.  Pt presented to ED with daughter.  Daughter reported increased confusion which is what she usually sees with patients UTIs.  Has 24h home care, ambulates with assistance at baseline.  No CP, cough, N/V/D.   ED Course: UA suggestive of UTI, Tm 99.x, WBC 13k.  Hospital Course:  1 recurrent UTI-felt likely secondary to presumed ESBL E. coli due to prior extensive history. -Urine cultures with insignificant growth. -Daughter was adamant/concerned about a possible bacteremia and as such blood cultures ordered with no growth to date.  -Patient remained afebrile.   -Patient treated empirically with IV Merrem.  -Per urology, urine cultures  obtained in the outpatient setting negative.  -Leukocytosis improved and normalized.   -Patient improving clinically. -Patient completed a 5-day course of empiric IV Merrem and no further antibiotics needed on discharge. -Outpatient follow-up with urology.    2.  Acute metabolic encephalopathy Likely secondary to problem #1 in the setting of dehydration.??  Possible underlying mild cognitive impairment.  -Patient with good long-term memory. -Patient  improved clinically with empiric treatment for probable UTI..   -Patient pancultured, results pending with no growth to date.   -Urine culture with insignificant growth.   -Per urology urine cultures obtained in the outpatient setting with no growth.   -CT head done (05/07/2020) with generalized cerebral volume loss and moderate chronic small vessel ischemic changes in the cerebral white matter. -TSH obtained 05/07/2020 at 0.681.  Vitamin B12 level 1862 (07/07/2020), folate 34.7 (07/07/2020). -Ammonia level currently at 27. -Patient improved clinically complete a 5-day course of empiric IV antibiotics will be discharged home in stable and improved condition.   3.  Chronic pain -Controlled on home regimen of Norco.  4.  Well-controlled diabetes mellitus type 2 -Hemoglobin A1c 5.6 (07/07/2020) -Patient maintained on Lantus and sliding scale insulin during the hospitalization.   5.  Depression -Patient maintained on home regimen of Elavil, Cymbalta.   6.  GERD -Patient maintained on PPI.   7.  Hypothyroidism -Patient maintained on home regimen Synthroid.   8.  Mild cognitive impairment -Patient noted on head CT(05/07/2020) with generalized cerebral volume loss and moderate chronic small vessel ischemic changes in the cerebral white matter. -TSH done within normal limits, RBC folate within normal limits.  Vitamin B12 within  normal limits. -Patient assessed by speech therapy and felt patient likely baseline cognitive function and no  further evaluation needed -Outpatient follow-up with PCP.    Procedures:  Chest x-ray 07/31/2018    Consultations:  None    Discharge Exam: Vitals:   08/04/20 0954 08/04/20 1443  BP:  131/72  Pulse:  93  Resp:  16  Temp:  98.4 F (36.9 C)  SpO2: 95% 98%    General: NAD Cardiovascular: RRR Respiratory: CTAB  Discharge Instructions   Discharge Instructions    Diet Carb Modified   Complete by: As directed    Increase activity slowly   Complete by: As directed      Allergies as of 08/04/2020      Reactions   Latex Rash   Penicillins Rash   Has patient had a PCN reaction causing immediate rash, facial/tongue/throat swelling, SOB or lightheadedness with hypotension: No Has patient had a PCN reaction causing severe rash involving mucus membranes or skin necrosis: No Has patient had a PCN reaction that required hospitalization: No Has patient had a PCN reaction occurring within the last 10 years: No If all of the above answers are "NO", then may proceed with Cephalosporin use.      Medication List    TAKE these medications   acetaminophen 650 MG CR tablet Commonly known as: TYLENOL Take 1,300 mg by mouth every 4 (four) hours. What changed: Another medication with the same name was added. Make sure you understand how and when to take each.   acetaminophen 325 MG tablet Commonly known as: TYLENOL Take 2 tablets (650 mg total) by mouth every 6 (six) hours as needed for mild pain (or Fever >/= 101). What changed: You were already taking a medication with the same name, and this prescription was added. Make sure you understand how and when to take each.   albuterol 108 (90 Base) MCG/ACT inhaler Commonly known as: ProAir HFA Inhale 2 puffs into the lungs every 6 (six) hours as needed for wheezing or shortness of breath.   amitriptyline 10 MG tablet Commonly known as: ELAVIL TAKE 1 TABLET BY MOUTH EVERYDAY AT BEDTIME What changed:   how much to take  how  to take this  when to take this  additional instructions   baclofen 10 MG tablet Commonly known as: LIORESAL TAKE 0.5-1 TABLET BY MOUTH TWICE DAILY AS NEEDED FOR MUSCLE SPASMS OR PAIN What changed: See the new instructions.   BD Pen Needle Nano 2nd Gen 32G X 4 MM Misc Generic drug: Insulin Pen Needle USE AS DIRECTED 4 TIMES A DAY   busPIRone 5 MG tablet Commonly known as: BUSPAR TAKE 1 TO 2 TABLETS BY MOUTH TWICE A DAY What changed: See the new instructions.   calcium carbonate 1500 (600 Ca) MG Tabs tablet Commonly known as: OSCAL Take 600 mg of elemental calcium by mouth 2 (two) times daily with a meal.   cholecalciferol 25 MCG (1000 UNIT) tablet Commonly known as: VITAMIN D3 Take 1,000 Units by mouth daily.   clotrimazole-betamethasone cream Commonly known as: LOTRISONE APPLY TO AFFECTED AREA TWICE A DAY   DULoxetine 30 MG capsule Commonly known as: CYMBALTA Take 1 capsule (30 mg total) by mouth daily. What changed: additional instructions   DULoxetine 60 MG capsule Commonly known as: CYMBALTA Take 1 capsule (60 mg total) by mouth daily. What changed: additional instructions   estradiol 0.1 MG/GM vaginal cream Commonly known as: ESTRACE Place 1 Applicatorful vaginally See admin instructions. Takes 2  times a week   feeding supplement Liqd Take 237 mLs by mouth 2 (two) times daily between meals. What changed:   when to take this  reasons to take this   freestyle lancets Use to test blood sugar 3 times daily. Dx: E11.65   FREESTYLE LITE test strip Generic drug: glucose blood USE TO TEST BLOOD SUGAR 3 TIMES DAILY. DX: E11.65   HYDROcodone-acetaminophen 10-325 MG tablet Commonly known as: NORCO Take 1 tablet by mouth every 4 (four) hours.   ipratropium-albuterol 0.5-2.5 (3) MG/3ML Soln Commonly known as: DUONEB Inhale 3 mLs into the lungs 3 (three) times daily as needed (shortness of breath). USE 1 VIAL IN NEBULIZER 3 TIMES DAILY What changed:   when  to take this  additional instructions   Lantus SoloStar 100 UNIT/ML Solostar Pen Generic drug: insulin glargine Inject 8 Units into the skin daily.   levothyroxine 50 MCG tablet Commonly known as: SYNTHROID TAKE 1 TABLET BY MOUTH EVERY DAY BEFORE BREAKFAST What changed: See the new instructions.   Lidocaine 4 % Ptch Apply 1 patch topically 2 (two) times daily as needed (pain). What changed: Another medication with the same name was changed. Make sure you understand how and when to take each.   lidocaine 5 % ointment Commonly known as: XYLOCAINE Apply 1 application topically as needed. Apply to external vulvar area What changed:   when to take this  reasons to take this  additional instructions   magnesium oxide 400 MG tablet Commonly known as: MAG-OX TAKE 1 TABLET BY MOUTH EVERY DAY What changed: when to take this   methenamine 1 g tablet Commonly known as: MANDELAMINE Take 1,000 mg by mouth 2 (two) times daily.   montelukast 10 MG tablet Commonly known as: SINGULAIR TAKE 1 TABLET BY MOUTH EVERY DAY What changed: when to take this   MUCINEX D MAX STRENGTH PO Take 1,200 mg by mouth in the morning and at bedtime.   Mucinex Fast-Max 10-650-400 MG/20ML Liqd Generic drug: Phenylephrine-APAP-guaiFENesin Take 20 mLs by mouth at bedtime.   multivitamin with minerals Tabs tablet Take 1 tablet by mouth daily. Centrum   naloxone 4 MG/0.1ML Liqd nasal spray kit Commonly known as: NARCAN Place 1 spray into the nose once as needed (overdose).   omeprazole 20 MG capsule Commonly known as: PRILOSEC TAKE 1 CAPSULE BY MOUTH 2 TIMES A DAY BEFORE A MEAL What changed: See the new instructions.   polyethylene glycol 17 g packet Commonly known as: MIRALAX / GLYCOLAX Take 17 g by mouth daily.   predniSONE 1 MG tablet Commonly known as: DELTASONE Take 2 tablets (2 mg total) by mouth daily. What changed:   how much to take  when to take this   PREVAGEN PO Take 1  tablet by mouth daily.   rosuvastatin 10 MG tablet Commonly known as: CRESTOR Take 1 tablet (10 mg total) by mouth every evening.   TART CHERRY ADVANCED PO Take 1 tablet by mouth daily.   Trelegy Ellipta 100-62.5-25 MCG/INH Aepb Generic drug: Fluticasone-Umeclidin-Vilant INHALE 1 PUFF INTO THE LUNGS DAILY. RINSE MOUTH What changed: additional instructions   TURMERIC PO Take 1 tablet by mouth daily.   VITAMIN B 12 PO Take 2,500 mg by mouth See admin instructions. Takes 2 times a week - Tuesdays and Thursdays   vitamin C 1000 MG tablet Take 1,000 mg by mouth 2 (two) times daily.      Allergies  Allergen Reactions  . Latex Rash  . Penicillins Rash  Has patient had a PCN reaction causing immediate rash, facial/tongue/throat swelling, SOB or lightheadedness with hypotension: No Has patient had a PCN reaction causing severe rash involving mucus membranes or skin necrosis: No Has patient had a PCN reaction that required hospitalization: No Has patient had a PCN reaction occurring within the last 10 years: No If all of the above answers are "NO", then may proceed with Cephalosporin use.     Follow-up Information    [name], MD. Schedule an appointment as soon as possible for a visit in 2 week(s).   Specialty: Internal Medicine Contact information: [place] [address] [phone]        Werner Lean, MD .   Specialty: Cardiology Contact information: [place] [address] [phone]        Franchot Gallo, MD Follow up.   Specialty: Urology Why: f/u as scheduled Contact information: [place] [address] [phone]                The results of significant diagnostics from this hospitalization (including imaging, microbiology, ancillary and laboratory) are listed below for reference.    Significant Diagnostic Studies: DG Chest Port 1 View  Result Date:  07/30/2020 CLINICAL DATA:  Weakness with confusion EXAM: PORTABLE CHEST 1 VIEW COMPARISON:  July 07, 2020 FINDINGS: No edema or airspace opacity. Heart size and pulmonary vascularity are normal. No adenopathy. Old healed fracture posterior right seventh rib, stable. Degenerative change noted in each shoulder. IMPRESSION: No edema or airspace opacity. Cardiac silhouette within normal limits. Electronically Signed   By: Lowella Grip III M.D.   On: 07/30/2020 18:19   DG Chest Port 1 View  Result Date: 07/07/2020 CLINICAL DATA:  Sepsis. EXAM: PORTABLE CHEST 1 VIEW COMPARISON:  May 07, 2020. FINDINGS: The heart size and mediastinal contours are within normal limits. Both lungs are clear. The visualized skeletal structures are unremarkable. IMPRESSION: No active disease. Electronically Signed   By: Marijo Conception M.D.   On: 07/07/2020 12:28    Microbiology: Recent Results (from the past 240 hour(s))  Resp Panel by RT-PCR (Flu A&B, Covid) Nasopharyngeal Swab     Status: None   Collection Time: 07/30/20  5:15 PM   Specimen: Nasopharyngeal Swab; Nasopharyngeal(NP) swabs in vial transport medium  Result Value Ref Range Status   SARS Coronavirus 2 by RT PCR NEGATIVE NEGATIVE Final    Comment: (NOTE) SARS-CoV-2 target nucleic acids are NOT DETECTED.  The SARS-CoV-2 RNA is generally detectable in upper respiratory specimens during the acute phase of infection. The lowest concentration of SARS-CoV-2 viral copies this assay can detect is 138 copies/mL. A negative result does not preclude SARS-Cov-2 infection and should not be used as the sole basis for treatment or other patient management decisions. A negative result may occur with  improper specimen collection/handling, submission of specimen other than nasopharyngeal swab, presence of viral mutation(s) within the areas targeted by this assay, and inadequate number of viral copies(<138 copies/mL). A negative result must be combined  with clinical observations, patient history, and epidemiological information. The expected result is Negative.  Fact Sheet for Patients:  EntrepreneurPulse.com.au  Fact Sheet for Healthcare Providers:  IncredibleEmployment.be  This test is no t yet approved or cleared by the [place] FDA and  has been authorized for detection and/or diagnosis of SARS-CoV-2 by FDA under an Emergency Use Authorization (EUA). This EUA will remain  in effect (meaning this test can  be used) for the duration of the COVID-19 declaration under Section 564(b)(1) of the Act, 21 U.S.C.section 360bbb-3(b)(1), unless the authorization is terminated  or revoked sooner.       Influenza A by PCR NEGATIVE NEGATIVE Final   Influenza B by PCR NEGATIVE NEGATIVE Final    Comment: (NOTE) The Xpert Xpress SARS-CoV-2/FLU/RSV plus assay is intended as an aid in the diagnosis of influenza from Nasopharyngeal swab specimens and should not be used as a sole basis for treatment. Nasal washings and aspirates are unacceptable for Xpert Xpress SARS-CoV-2/FLU/RSV testing.  Fact Sheet for Patients: EntrepreneurPulse.com.au  Fact Sheet for Healthcare Providers: IncredibleEmployment.be  This test is not yet approved or cleared by the [place] FDA and has been authorized for detection and/or diagnosis of SARS-CoV-2 by FDA under an Emergency Use Authorization (EUA). This EUA will remain in effect (meaning this test can be used) for the duration of the COVID-19 declaration under Section 564(b)(1) of the Act, 21 U.S.C. section 360bbb-3(b)(1), unless the authorization is terminated or revoked.  Performed at [facility], [place] [address], [place], [place] [address]   Urine Culture     Status: Abnormal   Collection Time: 07/30/20  7:00 PM   Specimen: Urine, Catheterized  Result Value Ref Range Status   Specimen Description    Final    URINE, CATHETERIZED Performed at [facility] [address], [place], [place] [address]    Special Requests   Final    NONE Performed at [facility], [place] [address], [place], [place] [address]    Culture (A)  Final    <10,000 COLONIES/mL INSIGNIFICANT GROWTH Performed at [facility] [address] [place], [place] [address]    Report Status 08/01/2020 FINAL  Final  Culture, blood (Routine X 2) w Reflex to ID Panel     Status: None (Preliminary result)   Collection Time: 07/31/20  2:24 PM   Specimen: BLOOD RIGHT HAND  Result Value Ref Range Status   Specimen Description   Final    BLOOD RIGHT HAND Performed at [facility] [address] [place], [place] [address]    Special Requests   Final    BOTTLES DRAWN AEROBIC ONLY Blood Culture adequate volume Performed at [facility] [address], [place], [place] [address]    Culture   Final    NO GROWTH 4 DAYS Performed at [facility], [place] [address] [place], [place] [address]    Report Status PENDING  Incomplete  Culture, blood (Routine X 2) w Reflex to ID Panel     Status: None (Preliminary result)   Collection Time: 07/31/20  2:24 PM   Specimen: BLOOD LEFT HAND  Result Value Ref Range Status   Specimen Description   Final    BLOOD LEFT HAND Performed at [facility] [address], [place], [place] [address]    Special Requests   Final    BOTTLES DRAWN AEROBIC AND ANAEROBIC Blood Culture adequate volume Performed at [facility] [address], [place], [place] [address]    Culture   Final    NO GROWTH 4 DAYS Performed at [facility], [place] [address], [place], [place] [address]    Report Status PENDING  Incomplete     Labs: Basic Metabolic Panel: Recent Labs  Lab 07/31/20 0315 08/01/20 0323 08/02/20 0248 08/03/20 0403 08/04/20 0310  NA 137 138 140 138  138  K  3.9 4.0 3.9 3.9 4.0  CL 110 110 111 108 105  CO2 19* _0 GLUCOSE 139* 108* 111* 130* 135*  BUN _1 CREATININE 0.98 0.82 0.76 0.82 0.63  CALCIUM 8.9 8.6* 8.1* 8.1* 8.7*  MG  --  2.0  --   --   --    Liver Function Tests: Recent Labs  Lab 07/30/20 1712  AST 16  ALT 13  ALKPHOS 24*  BILITOT 0.4  PROT 6.4*  ALBUMIN 3.5   No results for input(s): LIPASE, AMYLASE in the last 168 hours. Recent Labs  Lab 08/01/20 1348 08/02/20 0248  AMMONIA 84* 27   CBC: Recent Labs  Lab 07/30/20 1712 07/31/20 0315 08/01/20 0323 08/02/20 0248  WBC 13.3* 14.3* 11.7* 9.6  NEUTROABS  --   --  6.7 4.9  HGB 11.2* 10.4* 9.6* 10.2*  HCT 35.2* 32.9* 30.6* 32.6*  MCV 118.9* 119.2* 117.2* 119.9*  PLT 316 261 254 226   Cardiac Enzymes: No results for input(s): CKTOTAL, CKMB, CKMBINDEX, TROPONINI in the last 168 hours. BNP: BNP (last 3 results) Recent Labs    12/07/19 1440 01/12/20 1048  BNP 31 264.6*    ProBNP (last 3 results) Recent Labs    11/20/19 1549 01/25/20 1147  PROBNP 58.0 103.0*    CBG: Recent Labs  Lab 08/03/20 1139 08/03/20 1601 08/03/20 2136 08/04/20 0739 08/04/20 1222  GLUCAP 178* 179* 171* 156* 168*       Signed:  Irine Seal MD.  [facility] 08/04/2020, 3:18 PM

## 2020-08-04 NOTE — TOC Initial Note (Signed)
Transition of Care [facility]) - Initial/Assessment Note    Patient Details  Name: Kayla Thompson MRN: [id number] Date of Birth: [birth year]  [facility] of [facility] CM/SW Contact:    Lennart Pall, LCSW Phone Number: 08/04/2020, 11:11 AM  Clinical Narrative:                 Spoke with pt and daughter (via phone) today to review dc needs.  MD reporting may be medically ready for dc end of day today/ tomorrow.  Both confirming pt was receiving HHPT via [facility] at the time of her hospitalization.  Pt would like to continue services with them and referral sent for HHPT and OT.  No further TOC needs.  Please notify if anything needed.  Expected Discharge Plan: [place] Barriers to Discharge: Continued Medical Work up   Patient Goals and CMS Choice Patient states their goals for this hospitalization and ongoing recovery are:: return home      Expected Discharge Plan and Services Expected Discharge Plan: [place] In-house Referral: Clinical Social Work   Post Acute Care Choice: [place] arrangements for the past 2 months: Single Family Home                 DME Arranged: N/A DME Agency: NA       HH Arranged: PT,OT [place] Agency: [place] Date [place]: 08/04/20 Time [place]: 68 Representative spoke with at [place]: [place] Arrangements/Services Living arrangements for the past 2 months: [place] with:: Self Patient language and need for interpreter reviewed:: Yes Do you feel safe going back to the place where you live?: Yes      Need for Family Participation in Patient Care: No (Comment) Care giver support system in place?: Yes (comment) Current home services: [facility],Home PT (open to [facility] PTA) Criminal Activity/Legal Involvement Pertinent to Current Situation/Hospitalization: No - Comment as needed  Activities of Daily Living Home Assistive Devices/Equipment:  Eyeglasses,CBG Meter,Walker (specify type),Shower chair with back (4 wheeled walker) ADL Screening (condition at time of admission) Patient's cognitive ability adequate to safely complete daily activities?: No (patient sleepy with some confusion) Is the patient deaf or have difficulty hearing?: No Does the patient have difficulty seeing, even when wearing glasses/contacts?: No Does the patient have difficulty concentrating, remembering, or making decisions?: Yes (confusion now per daughter) Patient able to express need for assistance with ADLs?: Yes Does the patient have difficulty dressing or bathing?: Yes Independently performs ADLs?: No Communication: Independent Dressing (OT): Needs assistance Is this a change from baseline?: Pre-admission baseline Grooming: Independent Feeding: Independent Bathing: Needs assistance Is this a change from baseline?: Pre-admission baseline Toileting: Needs assistance Is this a change from baseline?: Pre-admission baseline In/Out Bed: Needs assistance Is this a change from baseline?: Pre-admission baseline Walks in Home: Needs assistance Is this a change from baseline?: Pre-admission baseline Does the patient have difficulty walking or climbing stairs?: Yes (secondary to weakness) Weakness of Legs: Both Weakness of Arms/Hands: None  Permission Sought/Granted Permission sought to share information with : Family Supports Permission granted to share information with : Yes, Verbal Permission Granted  Share Information with NAME: Gardiner Ramus     Permission granted to share info w Relationship: daughter  Permission granted to share info w Contact Information: [phone]  Emotional Assessment Appearance:: Appears stated age Attitude/Demeanor/Rapport: Gracious Affect (typically observed): Accepting Orientation: : Oriented to Self,Oriented to Place,Oriented to  Time,Oriented  to Situation Alcohol / Substance Use: Not Applicable Psych Involvement:  No (comment)  Admission diagnosis:  Urinary tract infection without hematuria, site unspecified [N39.0] Altered mental status, unspecified altered mental status type [R41.82] UTI due to extended-spectrum beta lactamase (ESBL) producing Escherichia coli [N39.0, B96.29, Z16.12] Patient Active Problem List   Diagnosis Date Noted  . Other chronic pain   . UTI due to extended-spectrum beta lactamase (ESBL) producing Escherichia coli 07/30/2020  . AMS (altered mental status) 07/07/2020  . Hypertension associated with diabetes ([place]) 05/07/2020  . Hyperlipidemia associated with type 2 diabetes mellitus ([place]) 05/07/2020  . Depression 05/07/2020  . PAD (peripheral artery disease) ([place]) 02/05/2020  . Lower extremity edema 11/20/2019  . UTI symptoms 05/29/2019  . Dysuria 05/11/2019  . Upper esophageal web 03/27/2019  . Iron deficiency anemia 03/27/2019  . Candida esophagitis ([place]) 02/14/2019  . Acute lower UTI 01/25/2019  . Acute metabolic encephalopathy 77/82/4235  . Encounter for general adult medical examination with abnormal findings 01/09/2019  . Abnormal barium swallow 12/28/2018  . History of colonic polyps 12/28/2018  . Dysphagia 12/28/2018  . Diabetic foot ulcer ([place]) 12/05/2018  . Rotator cuff arthropathy, left 11/16/2018  . Degenerative arthritis of left knee 11/16/2018  . Insomnia 09/26/2018  . Leukocytosis 10/17/2017  . Severe protein-calorie malnutrition ([place]) 10/10/2017  . Memory changes 07/29/2017  . Granulomatous lung disease ([place]) 06/14/2017  . Tracheobronchomalacia 06/14/2017  . Dizziness 03/04/2017  . Exercise hypoxemia 07/12/2016  . Diastolic CHF, chronic ([place]) 12/25/2015  . GERD (gastroesophageal reflux disease) 12/25/2015  . Epidermal inclusion cyst 09/02/2015  . Muscle cramps 03/06/2015  . Cough 01/07/2015  . Bronchiectasis without acute exacerbation ([place]) 01/07/2015  . PMR (polymyalgia rheumatica) ([place]) 01/07/2015  . Osteoarthritis 01/07/2015  . Asthma,  chronic 11/12/2014  . Anxiety state 11/12/2014  . Hyperparathyroidism ([place]) 10/28/2014  . Fibromyalgia 09/21/2014  . Personal history of ovarian cancer 08/23/2014  . Osteoporosis 08/23/2014  . DM2 (diabetes mellitus, type 2) ([place]) 08/02/2014  . HLD (hyperlipidemia) 08/02/2014  . Hypothyroidism 08/02/2014   PCP:  [name], MD Pharmacy:   [id number] - [place], [place] [address] Phone: [phone] Fax: [phone]     Social Determinants of Health (SDOH) Interventions    Readmission Risk Interventions Readmission Risk Prevention Plan 08/04/2020 05/09/2020  Transportation Screening Complete Complete  PCP or Specialist Appt within 5-7 Days - Complete  Home Care Screening - Complete  PCP or Specialist appointment within 3-5 days of discharge Complete -  SW Recovery Care/Counseling Consult Complete -  [place] Not Applicable -  Some recent data might be hidden

## 2020-08-04 NOTE — Evaluation (Signed)
Speech Language Pathology Evaluation Patient Details Name: Kayla Thompson MRN: [id number] DOB: 01-30-1936 Today's Date: 08/04/2020 Time: 1115-1130 SLP Time Calculation (min) (ACUTE ONLY): 15 min  Problem List:  Patient Active Problem List   Diagnosis Date Noted  . Other chronic pain   . UTI due to extended-spectrum beta lactamase (ESBL) producing Escherichia coli 07/30/2020  . AMS (altered mental status) 07/07/2020  . Hypertension associated with diabetes ([place]) 05/07/2020  . Hyperlipidemia associated with type 2 diabetes mellitus ([place]) 05/07/2020  . Depression 05/07/2020  . PAD (peripheral artery disease) ([place]) 02/05/2020  . Lower extremity edema 11/20/2019  . UTI symptoms 05/29/2019  . Dysuria 05/11/2019  . Upper esophageal web 03/27/2019  . Iron deficiency anemia 03/27/2019  . Candida esophagitis ([place]) 02/14/2019  . Acute lower UTI 01/25/2019  . Acute metabolic encephalopathy 40/98/1191  . Encounter for general adult medical examination with abnormal findings 01/09/2019  . Abnormal barium swallow 12/28/2018  . History of colonic polyps 12/28/2018  . Dysphagia 12/28/2018  . Diabetic foot ulcer ([place]) 12/05/2018  . Rotator cuff arthropathy, left 11/16/2018  . Degenerative arthritis of left knee 11/16/2018  . Insomnia 09/26/2018  . Leukocytosis 10/17/2017  . Severe protein-calorie malnutrition ([place]) 10/10/2017  . Memory changes 07/29/2017  . Granulomatous lung disease ([place]) 06/14/2017  . Tracheobronchomalacia 06/14/2017  . Dizziness 03/04/2017  . Exercise hypoxemia 07/12/2016  . Diastolic CHF, chronic ([place]) 12/25/2015  . GERD (gastroesophageal reflux disease) 12/25/2015  . Epidermal inclusion cyst 09/02/2015  . Muscle cramps 03/06/2015  . Cough 01/07/2015  . Bronchiectasis without acute exacerbation ([place]) 01/07/2015  . PMR (polymyalgia rheumatica) ([place]) 01/07/2015  . Osteoarthritis 01/07/2015  . Asthma, chronic 11/12/2014  . Anxiety state 11/12/2014  .  Hyperparathyroidism ([place]) 10/28/2014  . Fibromyalgia 09/21/2014  . Personal history of ovarian cancer 08/23/2014  . Osteoporosis 08/23/2014  . DM2 (diabetes mellitus, type 2) ([place]) 08/02/2014  . HLD (hyperlipidemia) 08/02/2014  . Hypothyroidism 08/02/2014   Past Medical History:  Past Medical History:  Diagnosis Date  . Anxiety   . Arthritis   . Asthma   . Cataracts, bilateral    removed by surgery  . COPD (chronic obstructive pulmonary disease) ([place])   . Depression   . Diabetes mellitus without complication ([place])    type 2  . Dyspnea    with exertion  . Fibromyalgia   . GERD (gastroesophageal reflux disease)   . History of chemotherapy   . History of fractured pelvis   . Hypertension   . Hypothyroidism   . Osteoporosis 12/2017   T score -2.8 stable from prior DEXA  . Osteoporosis   . Ovarian cancer ([place])   . Pneumonia    several times   Past Surgical History:  Past Surgical History:  Procedure Laterality Date  . ABDOMINAL HYSTERECTOMY  1996  . ANKLE FRACTURE SURGERY     plate and screws  . BIOPSY  02/05/2019   Procedure: BIOPSY;  Surgeon: Rush Landmark Telford Nab., MD;  Location: [place];  Service: Gastroenterology;;  . BLADDER SUSPENSION    . CATARACT EXTRACTION    . COLONOSCOPY    . ESOPHAGEAL BRUSHING  02/05/2019   Procedure: ESOPHAGEAL BRUSHING;  Surgeon: Rush Landmark Telford Nab., MD;  Location: [facility];  Service: Gastroenterology;;  . ESOPHAGOGASTRODUODENOSCOPY (EGD) WITH PROPOFOL N/A 02/05/2019   Procedure: ESOPHAGOGASTRODUODENOSCOPY (EGD) WITH PROPOFOL with dialtion;  Surgeon: Irving Copas., MD;  Location: [place];  Service: Gastroenterology;  Laterality: N/A;  . EYE SURGERY     cataracts removed-bilateral  .  hip surgey    . knee surgey    . LUNG SURGERY    . OOPHORECTOMY     BSO  . SAVORY DILATION N/A 02/05/2019   Procedure: SAVORY DILATION;  Surgeon: Rush Landmark Telford Nab., MD;  Location: [place];  Service:  Gastroenterology;  Laterality: N/A;  . TONSILLECTOMY    . UPPER GI ENDOSCOPY     HPI:  Pt is 85 yo female who presented with increased confusion.  Admitted with recurrent UTI likely secondary to ESBL Ecoli on 07/30/20.  Pt with PMH of DM2, HTN, recurrent ESBL UTI.   Assessment / Plan / Recommendation Clinical Impression  Patient presents with a mild cognitive impairment impacting orientation to time, recall of new information and questionable decreased awareness to impact of deficits. She was oriented to place, situation "they thought I had a UTI" but slightly disoriented to time (oriented to month and year, not day of week or date). At baseline, patient lives at home but has 24 hour caregivers. Although MRI brain and CT head not performed this admission, suspect that patient is at or near her baseline cognitive function. Patient to be discharged back home with [facility] PT and OT and continued 24 hour supervision from caregivers. SLP is not recommending f/u ST services at this time however recommendation is for [facility] SLP evaluation if patient exhibits any decline in cognitive function.    SLP Assessment  SLP Recommendation/Assessment: Patient does not need any further Speech Lanaguage Pathology Services SLP Visit Diagnosis: Cognitive communication deficit (R41.841)    Follow Up Recommendations  None;24 hour supervision/assistance    Frequency and Duration   N/A        SLP Evaluation Cognition  Overall Cognitive Status: Difficult to assess Arousal/Alertness: Awake/alert Orientation Level: Oriented to person;Oriented to place;Disoriented to time (oriented to month and year, stated DOW as "Saturday" (it's Monday) and date as "end of May") Attention: Sustained Sustained Attention: Appears intact Memory: Impaired Memory Impairment: Decreased recall of new information Awareness: Impaired Awareness Impairment: Emergent impairment;Anticipatory impairment Problem Solving: Appears intact (appears intact  for basic level problem solving) Safety/Judgment: Impaired       Comprehension  Auditory Comprehension Overall Auditory Comprehension: Appears within functional limits for tasks assessed    Expression Expression Primary Mode of Expression: Verbal Verbal Expression Overall Verbal Expression: Appears within functional limits for tasks assessed   Oral / Motor  Oral Motor/Sensory Function Overall Oral Motor/Sensory Function: Within functional limits Motor Speech Overall Motor Speech: Appears within functional limits for tasks assessed   GO                    Sonia Baller, MA, CCC-SLP Speech Therapy

## 2020-08-04 NOTE — Progress Notes (Signed)
Pharmacy Antibiotic Note  Kayla Thompson is a 85 y.o. female admitted on 07/30/2020 with UTI.  Pharmacy has been consulted for Meropenem dosing for history of ESBL UTI (most recently on 07/07/20)  D5 Full Abx with Meropenem Afebrile WBC improved to WNL SCr improved, CrCl ~40 ml/min UCx: insignificant growth  Plan: Meropenem 1g IV q12h remains appropriate dose per renal function MD notes will d/c antibiotic if cultures remain neg (blood cultures remain negative, urine insignificant growth)  Height: 4\' 11"  (149.9 cm) Weight: 53 kg (116 lb 13.5 oz) IBW/kg (Calculated) : 43.2  Temp (24hrs), Avg:98 F (36.7 C), Min:97.6 F (36.4 C), Max:98.5 F (36.9 C)  Recent Labs  Lab 07/30/20 1712 07/31/20 0315 08/01/20 0323 08/02/20 0248 08/03/20 0403 08/04/20 0310  WBC 13.3* 14.3* 11.7* 9.6  --   --   CREATININE 1.16* 0.98 0.82 0.76 0.82 0.63    Estimated Creatinine Clearance: 39.6 mL/min (by C-G formula based on SCr of 0.63 mg/dL).    Allergies  Allergen Reactions  . Latex Rash  . Penicillins Rash    Has patient had a PCN reaction causing immediate rash, facial/tongue/throat swelling, SOB or lightheadedness with hypotension: No Has patient had a PCN reaction causing severe rash involving mucus membranes or skin necrosis: No Has patient had a PCN reaction that required hospitalization: No Has patient had a PCN reaction occurring within the last 10 years: No If all of the above answers are "NO", then may proceed with Cephalosporin use.    Antimicrobials this admission: 5/11 Meropenem >>   Dose adjustments this admission:  Microbiology results: 5/11 UCx: <10k insignificant growth 5/12 BCx: ngtd  PTA 07/07/20 UCx: 40 K ESBL E coli. Sens Cipro/imi/pip-tazo  Thank you for allowing pharmacy to be a part of this patient's care.  Peggyann Juba, PharmD, BCPS Pharmacy: [phone] 08/04/2020 9:50 AM

## 2020-08-04 NOTE — Care Management Important Message (Signed)
Important Message  Patient Details IM Letter given to the Patient. Name: Kayla Thompson MRN: [id number] Date of Birth: 14-Aug-1935   Medicare Important Message Given:  Yes     Kerin Salen 08/04/2020, 12:23 PM

## 2020-08-05 LAB — CULTURE, BLOOD (ROUTINE X 2)
Culture: NO GROWTH
Culture: NO GROWTH
Special Requests: ADEQUATE
Special Requests: ADEQUATE

## 2020-08-06 DIAGNOSIS — G9341 Metabolic encephalopathy: Secondary | ICD-10-CM | POA: Diagnosis not present

## 2020-08-06 DIAGNOSIS — E1159 Type 2 diabetes mellitus with other circulatory complications: Secondary | ICD-10-CM | POA: Diagnosis not present

## 2020-08-06 DIAGNOSIS — N390 Urinary tract infection, site not specified: Secondary | ICD-10-CM | POA: Diagnosis not present

## 2020-08-06 DIAGNOSIS — I152 Hypertension secondary to endocrine disorders: Secondary | ICD-10-CM | POA: Diagnosis not present

## 2020-08-06 DIAGNOSIS — B9620 Unspecified Escherichia coli [E. coli] as the cause of diseases classified elsewhere: Secondary | ICD-10-CM | POA: Diagnosis not present

## 2020-08-06 DIAGNOSIS — Z1612 Extended spectrum beta lactamase (ESBL) resistance: Secondary | ICD-10-CM | POA: Diagnosis not present

## 2020-08-07 ENCOUNTER — Telehealth: Payer: Self-pay | Admitting: Internal Medicine

## 2020-08-07 NOTE — Telephone Encounter (Signed)
See below

## 2020-08-07 NOTE — Telephone Encounter (Signed)
    [place] Name: [facility] Agency Name: Shelly [facility]ard Phone #: [phone] Service Requested: PT Frequency of Visits: 1W1, 2W3, 1W2

## 2020-08-08 DIAGNOSIS — Z79899 Other long term (current) drug therapy: Secondary | ICD-10-CM | POA: Diagnosis not present

## 2020-08-08 DIAGNOSIS — M542 Cervicalgia: Secondary | ICD-10-CM | POA: Diagnosis not present

## 2020-08-08 NOTE — Telephone Encounter (Signed)
LVM with verbal orders. Office number was provided in case she has additional questions or concerns  

## 2020-08-08 NOTE — Telephone Encounter (Signed)
Fine

## 2020-08-12 DIAGNOSIS — B9620 Unspecified Escherichia coli [E. coli] as the cause of diseases classified elsewhere: Secondary | ICD-10-CM | POA: Diagnosis not present

## 2020-08-12 DIAGNOSIS — G9341 Metabolic encephalopathy: Secondary | ICD-10-CM | POA: Diagnosis not present

## 2020-08-12 DIAGNOSIS — N390 Urinary tract infection, site not specified: Secondary | ICD-10-CM | POA: Diagnosis not present

## 2020-08-12 DIAGNOSIS — E1159 Type 2 diabetes mellitus with other circulatory complications: Secondary | ICD-10-CM | POA: Diagnosis not present

## 2020-08-12 DIAGNOSIS — Z1612 Extended spectrum beta lactamase (ESBL) resistance: Secondary | ICD-10-CM | POA: Diagnosis not present

## 2020-08-12 DIAGNOSIS — I152 Hypertension secondary to endocrine disorders: Secondary | ICD-10-CM | POA: Diagnosis not present

## 2020-08-14 DIAGNOSIS — Z1612 Extended spectrum beta lactamase (ESBL) resistance: Secondary | ICD-10-CM | POA: Diagnosis not present

## 2020-08-14 DIAGNOSIS — B9620 Unspecified Escherichia coli [E. coli] as the cause of diseases classified elsewhere: Secondary | ICD-10-CM | POA: Diagnosis not present

## 2020-08-14 DIAGNOSIS — E1159 Type 2 diabetes mellitus with other circulatory complications: Secondary | ICD-10-CM | POA: Diagnosis not present

## 2020-08-14 DIAGNOSIS — G9341 Metabolic encephalopathy: Secondary | ICD-10-CM | POA: Diagnosis not present

## 2020-08-14 DIAGNOSIS — N390 Urinary tract infection, site not specified: Secondary | ICD-10-CM | POA: Diagnosis not present

## 2020-08-14 DIAGNOSIS — I152 Hypertension secondary to endocrine disorders: Secondary | ICD-10-CM | POA: Diagnosis not present

## 2020-08-15 DIAGNOSIS — F32A Depression, unspecified: Secondary | ICD-10-CM | POA: Diagnosis not present

## 2020-08-15 DIAGNOSIS — G9341 Metabolic encephalopathy: Secondary | ICD-10-CM | POA: Diagnosis not present

## 2020-08-15 DIAGNOSIS — E1151 Type 2 diabetes mellitus with diabetic peripheral angiopathy without gangrene: Secondary | ICD-10-CM | POA: Diagnosis not present

## 2020-08-15 DIAGNOSIS — E785 Hyperlipidemia, unspecified: Secondary | ICD-10-CM | POA: Diagnosis not present

## 2020-08-15 DIAGNOSIS — Z602 Problems related to living alone: Secondary | ICD-10-CM | POA: Diagnosis not present

## 2020-08-15 DIAGNOSIS — J479 Bronchiectasis, uncomplicated: Secondary | ICD-10-CM | POA: Diagnosis not present

## 2020-08-15 DIAGNOSIS — I5032 Chronic diastolic (congestive) heart failure: Secondary | ICD-10-CM | POA: Diagnosis not present

## 2020-08-15 DIAGNOSIS — N390 Urinary tract infection, site not specified: Secondary | ICD-10-CM | POA: Diagnosis not present

## 2020-08-15 DIAGNOSIS — G3184 Mild cognitive impairment, so stated: Secondary | ICD-10-CM | POA: Diagnosis not present

## 2020-08-15 DIAGNOSIS — J45909 Unspecified asthma, uncomplicated: Secondary | ICD-10-CM | POA: Diagnosis not present

## 2020-08-15 DIAGNOSIS — Z1612 Extended spectrum beta lactamase (ESBL) resistance: Secondary | ICD-10-CM | POA: Diagnosis not present

## 2020-08-15 DIAGNOSIS — B9620 Unspecified Escherichia coli [E. coli] as the cause of diseases classified elsewhere: Secondary | ICD-10-CM | POA: Diagnosis not present

## 2020-08-15 DIAGNOSIS — Z87440 Personal history of urinary (tract) infections: Secondary | ICD-10-CM | POA: Diagnosis not present

## 2020-08-15 DIAGNOSIS — I152 Hypertension secondary to endocrine disorders: Secondary | ICD-10-CM | POA: Diagnosis not present

## 2020-08-15 DIAGNOSIS — E1169 Type 2 diabetes mellitus with other specified complication: Secondary | ICD-10-CM | POA: Diagnosis not present

## 2020-08-15 DIAGNOSIS — E43 Unspecified severe protein-calorie malnutrition: Secondary | ICD-10-CM | POA: Diagnosis not present

## 2020-08-15 DIAGNOSIS — Z9181 History of falling: Secondary | ICD-10-CM | POA: Diagnosis not present

## 2020-08-15 DIAGNOSIS — F411 Generalized anxiety disorder: Secondary | ICD-10-CM | POA: Diagnosis not present

## 2020-08-15 DIAGNOSIS — E1159 Type 2 diabetes mellitus with other circulatory complications: Secondary | ICD-10-CM | POA: Diagnosis not present

## 2020-08-18 ENCOUNTER — Other Ambulatory Visit: Payer: Self-pay | Admitting: Internal Medicine

## 2020-08-19 DIAGNOSIS — I152 Hypertension secondary to endocrine disorders: Secondary | ICD-10-CM | POA: Diagnosis not present

## 2020-08-19 DIAGNOSIS — G9341 Metabolic encephalopathy: Secondary | ICD-10-CM | POA: Diagnosis not present

## 2020-08-19 DIAGNOSIS — N390 Urinary tract infection, site not specified: Secondary | ICD-10-CM | POA: Diagnosis not present

## 2020-08-19 DIAGNOSIS — Z1612 Extended spectrum beta lactamase (ESBL) resistance: Secondary | ICD-10-CM | POA: Diagnosis not present

## 2020-08-19 DIAGNOSIS — E1159 Type 2 diabetes mellitus with other circulatory complications: Secondary | ICD-10-CM | POA: Diagnosis not present

## 2020-08-19 DIAGNOSIS — B9620 Unspecified Escherichia coli [E. coli] as the cause of diseases classified elsewhere: Secondary | ICD-10-CM | POA: Diagnosis not present

## 2020-08-22 DIAGNOSIS — N390 Urinary tract infection, site not specified: Secondary | ICD-10-CM | POA: Diagnosis not present

## 2020-08-22 DIAGNOSIS — Z1612 Extended spectrum beta lactamase (ESBL) resistance: Secondary | ICD-10-CM | POA: Diagnosis not present

## 2020-08-22 DIAGNOSIS — G9341 Metabolic encephalopathy: Secondary | ICD-10-CM | POA: Diagnosis not present

## 2020-08-22 DIAGNOSIS — I152 Hypertension secondary to endocrine disorders: Secondary | ICD-10-CM | POA: Diagnosis not present

## 2020-08-22 DIAGNOSIS — E1159 Type 2 diabetes mellitus with other circulatory complications: Secondary | ICD-10-CM | POA: Diagnosis not present

## 2020-08-22 DIAGNOSIS — B9620 Unspecified Escherichia coli [E. coli] as the cause of diseases classified elsewhere: Secondary | ICD-10-CM | POA: Diagnosis not present

## 2020-08-26 DIAGNOSIS — B9620 Unspecified Escherichia coli [E. coli] as the cause of diseases classified elsewhere: Secondary | ICD-10-CM | POA: Diagnosis not present

## 2020-08-26 DIAGNOSIS — Z1612 Extended spectrum beta lactamase (ESBL) resistance: Secondary | ICD-10-CM | POA: Diagnosis not present

## 2020-08-26 DIAGNOSIS — I152 Hypertension secondary to endocrine disorders: Secondary | ICD-10-CM | POA: Diagnosis not present

## 2020-08-26 DIAGNOSIS — G9341 Metabolic encephalopathy: Secondary | ICD-10-CM | POA: Diagnosis not present

## 2020-08-26 DIAGNOSIS — N390 Urinary tract infection, site not specified: Secondary | ICD-10-CM | POA: Diagnosis not present

## 2020-08-26 DIAGNOSIS — E1159 Type 2 diabetes mellitus with other circulatory complications: Secondary | ICD-10-CM | POA: Diagnosis not present

## 2020-08-28 DIAGNOSIS — Z1612 Extended spectrum beta lactamase (ESBL) resistance: Secondary | ICD-10-CM | POA: Diagnosis not present

## 2020-08-28 DIAGNOSIS — B9620 Unspecified Escherichia coli [E. coli] as the cause of diseases classified elsewhere: Secondary | ICD-10-CM | POA: Diagnosis not present

## 2020-08-28 DIAGNOSIS — N390 Urinary tract infection, site not specified: Secondary | ICD-10-CM | POA: Diagnosis not present

## 2020-08-28 DIAGNOSIS — E1159 Type 2 diabetes mellitus with other circulatory complications: Secondary | ICD-10-CM | POA: Diagnosis not present

## 2020-08-28 DIAGNOSIS — G9341 Metabolic encephalopathy: Secondary | ICD-10-CM | POA: Diagnosis not present

## 2020-08-28 DIAGNOSIS — I152 Hypertension secondary to endocrine disorders: Secondary | ICD-10-CM | POA: Diagnosis not present

## 2020-09-03 DIAGNOSIS — Z79899 Other long term (current) drug therapy: Secondary | ICD-10-CM | POA: Diagnosis not present

## 2020-09-03 DIAGNOSIS — M542 Cervicalgia: Secondary | ICD-10-CM | POA: Diagnosis not present

## 2020-09-05 DIAGNOSIS — I152 Hypertension secondary to endocrine disorders: Secondary | ICD-10-CM | POA: Diagnosis not present

## 2020-09-05 DIAGNOSIS — E1159 Type 2 diabetes mellitus with other circulatory complications: Secondary | ICD-10-CM | POA: Diagnosis not present

## 2020-09-05 DIAGNOSIS — B9620 Unspecified Escherichia coli [E. coli] as the cause of diseases classified elsewhere: Secondary | ICD-10-CM | POA: Diagnosis not present

## 2020-09-05 DIAGNOSIS — N390 Urinary tract infection, site not specified: Secondary | ICD-10-CM | POA: Diagnosis not present

## 2020-09-05 DIAGNOSIS — Z1612 Extended spectrum beta lactamase (ESBL) resistance: Secondary | ICD-10-CM | POA: Diagnosis not present

## 2020-09-05 DIAGNOSIS — G9341 Metabolic encephalopathy: Secondary | ICD-10-CM | POA: Diagnosis not present

## 2020-09-10 ENCOUNTER — Other Ambulatory Visit: Payer: Self-pay | Admitting: Internal Medicine

## 2020-09-10 NOTE — Telephone Encounter (Signed)
Please contact patient and schedule an office visit for a recheck and pelvic ultrasound.

## 2020-09-11 DIAGNOSIS — E1159 Type 2 diabetes mellitus with other circulatory complications: Secondary | ICD-10-CM | POA: Diagnosis not present

## 2020-09-11 DIAGNOSIS — Z1612 Extended spectrum beta lactamase (ESBL) resistance: Secondary | ICD-10-CM | POA: Diagnosis not present

## 2020-09-11 DIAGNOSIS — B9620 Unspecified Escherichia coli [E. coli] as the cause of diseases classified elsewhere: Secondary | ICD-10-CM | POA: Diagnosis not present

## 2020-09-11 DIAGNOSIS — N390 Urinary tract infection, site not specified: Secondary | ICD-10-CM | POA: Diagnosis not present

## 2020-09-11 DIAGNOSIS — G9341 Metabolic encephalopathy: Secondary | ICD-10-CM | POA: Diagnosis not present

## 2020-09-11 DIAGNOSIS — I152 Hypertension secondary to endocrine disorders: Secondary | ICD-10-CM | POA: Diagnosis not present

## 2020-09-11 NOTE — Telephone Encounter (Signed)
Message routed to appt desk to contact patient to schedule.

## 2020-09-16 NOTE — Telephone Encounter (Signed)
Daughter Kayla Thompson was contacted today to schedule pelvic u/s for mom. She wanted to know why it was recommended and I shared Dr. Ann Held recommendation in his office note at patient's annual exam.  She said her mom is really frail and on oxycodone every 4 hours for pain and she is not sure this is something they want for her to do. She is going to discuss with sister Faythe Dingwall and  her mom and will call us back either way and let us know their decision.

## 2020-09-19 ENCOUNTER — Other Ambulatory Visit: Payer: Self-pay

## 2020-09-19 DIAGNOSIS — I10 Essential (primary) hypertension: Secondary | ICD-10-CM

## 2020-09-19 MED ORDER — FREESTYLE LITE TEST VI STRP: ORAL_STRIP | 5 refills | Status: DC

## 2020-09-22 ENCOUNTER — Other Ambulatory Visit: Payer: Self-pay | Admitting: Internal Medicine

## 2020-09-23 DIAGNOSIS — M2550 Pain in unspecified joint: Secondary | ICD-10-CM | POA: Diagnosis not present

## 2020-09-23 DIAGNOSIS — M810 Age-related osteoporosis without current pathological fracture: Secondary | ICD-10-CM | POA: Diagnosis not present

## 2020-09-23 DIAGNOSIS — M797 Fibromyalgia: Secondary | ICD-10-CM | POA: Diagnosis not present

## 2020-09-23 DIAGNOSIS — M79643 Pain in unspecified hand: Secondary | ICD-10-CM | POA: Diagnosis not present

## 2020-09-23 DIAGNOSIS — Z7952 Long term (current) use of systemic steroids: Secondary | ICD-10-CM | POA: Diagnosis not present

## 2020-09-23 DIAGNOSIS — M1990 Unspecified osteoarthritis, unspecified site: Secondary | ICD-10-CM | POA: Diagnosis not present

## 2020-09-23 DIAGNOSIS — M25519 Pain in unspecified shoulder: Secondary | ICD-10-CM | POA: Diagnosis not present

## 2020-09-23 DIAGNOSIS — Z8739 Personal history of other diseases of the musculoskeletal system and connective tissue: Secondary | ICD-10-CM | POA: Diagnosis not present

## 2020-09-23 DIAGNOSIS — R700 Elevated erythrocyte sedimentation rate: Secondary | ICD-10-CM | POA: Diagnosis not present

## 2020-09-23 NOTE — Telephone Encounter (Signed)
Refill request from pharmacy for Duoneb looks like Dr. Vernell Leep send in this prescription last for her.   Are you ok with refilling this for patient? Order is pended below

## 2020-09-23 NOTE — Telephone Encounter (Signed)
Nebulizer solution refilled

## 2020-09-26 DIAGNOSIS — N760 Acute vaginitis: Secondary | ICD-10-CM | POA: Diagnosis not present

## 2020-09-26 DIAGNOSIS — N3000 Acute cystitis without hematuria: Secondary | ICD-10-CM | POA: Diagnosis not present

## 2020-09-26 DIAGNOSIS — R8271 Bacteriuria: Secondary | ICD-10-CM | POA: Diagnosis not present

## 2020-10-02 ENCOUNTER — Other Ambulatory Visit: Payer: Self-pay | Admitting: Internal Medicine

## 2020-10-02 ENCOUNTER — Other Ambulatory Visit: Payer: Self-pay | Admitting: Gastroenterology

## 2020-10-02 DIAGNOSIS — E039 Hypothyroidism, unspecified: Secondary | ICD-10-CM

## 2020-10-02 DIAGNOSIS — M542 Cervicalgia: Secondary | ICD-10-CM | POA: Diagnosis not present

## 2020-10-02 DIAGNOSIS — R252 Cramp and spasm: Secondary | ICD-10-CM | POA: Diagnosis not present

## 2020-10-02 DIAGNOSIS — Z79899 Other long term (current) drug therapy: Secondary | ICD-10-CM | POA: Diagnosis not present

## 2020-10-08 ENCOUNTER — Other Ambulatory Visit: Payer: Self-pay

## 2020-10-08 NOTE — Telephone Encounter (Signed)
Dr. Lorre Munroe note at [place] 12/30/20  "4.  Vulvar irritation.  Discussed this is likely moisture related.  I recommended trying some topical lidocaine ointment as needed.  She is using petroleum jelly as a moisture barrier on the area and I encouraged her to continue to do this."

## 2020-10-09 ENCOUNTER — Ambulatory Visit (INDEPENDENT_AMBULATORY_CARE_PROVIDER_SITE_OTHER): Payer: Medicare Other | Admitting: Podiatry

## 2020-10-09 ENCOUNTER — Other Ambulatory Visit: Payer: Self-pay

## 2020-10-09 DIAGNOSIS — L84 Corns and callosities: Secondary | ICD-10-CM | POA: Diagnosis not present

## 2020-10-09 DIAGNOSIS — M79675 Pain in left toe(s): Secondary | ICD-10-CM | POA: Diagnosis not present

## 2020-10-09 DIAGNOSIS — M79674 Pain in right toe(s): Secondary | ICD-10-CM

## 2020-10-09 DIAGNOSIS — E1149 Type 2 diabetes mellitus with other diabetic neurological complication: Secondary | ICD-10-CM

## 2020-10-09 DIAGNOSIS — B351 Tinea unguium: Secondary | ICD-10-CM | POA: Diagnosis not present

## 2020-10-11 MED ORDER — LIDOCAINE 5 % EX OINT: 1.0000 "application " | TOPICAL_OINTMENT | CUTANEOUS | 2 refills | Status: DC | PRN

## 2020-10-13 DIAGNOSIS — H10502 Unspecified blepharoconjunctivitis, left eye: Secondary | ICD-10-CM | POA: Diagnosis not present

## 2020-10-13 NOTE — Progress Notes (Signed)
Subjective: 85 year old female presents to the office today for follow-up evaluation of right second toe pre-ulcerative callus as well as thick, elongated toenails that she cannot trim herself. No increase in swelling. No open lesions or drainage she reports. Denies any fevers, chills, nausea, vomiting.  No calf pain, chest pain, shortness of breath.  Objective: AAO x3, NAD-presents with caregiver DP/PT pulses palpable bilaterally, CRT less than 3 seconds Rigid hammertoe contracture present of the right second toe resulting in a mild hyperkeratotic lesion on the dorsal PIPJ.  No underlying ulceration drainage or any signs of infection.  Mild chronic edema to the toe without any fluctuation crepitation.  There is no malodor.  The toe appears to be stable without any changes. Nails are hypertrophic, dystrophic, brittle, discolored, elongated 10. No surrounding redness or drainage. Tenderness nails 1-5 bilaterally. No pain with calf compression, swelling, warmth, erythema  Assessment: Right second toe preulcerative callus; symptomatic onychomycosis  Plan: -All treatment options discussed with the patient including all alternatives, risks, complications.  -Debrided hyperkeratotic tissue with any complications or bleeding.  Continue offloading and monitor closely for any skin breakdown or recurrence of infection.  Dispensed further offloading pads today. -Debrided the nails x10 without any complications or bleeding.   Return in about 3 months (around 01/09/2021).  Trula Slade DPM

## 2020-10-22 ENCOUNTER — Other Ambulatory Visit: Payer: Self-pay | Admitting: Internal Medicine

## 2020-11-03 ENCOUNTER — Telehealth: Payer: Self-pay | Admitting: Internal Medicine

## 2020-11-03 DIAGNOSIS — E119 Type 2 diabetes mellitus without complications: Secondary | ICD-10-CM | POA: Diagnosis not present

## 2020-11-03 DIAGNOSIS — N183 Chronic kidney disease, stage 3 unspecified: Secondary | ICD-10-CM | POA: Diagnosis not present

## 2020-11-03 DIAGNOSIS — Z79899 Other long term (current) drug therapy: Secondary | ICD-10-CM | POA: Diagnosis not present

## 2020-11-03 DIAGNOSIS — R252 Cramp and spasm: Secondary | ICD-10-CM | POA: Diagnosis not present

## 2020-11-03 DIAGNOSIS — M542 Cervicalgia: Secondary | ICD-10-CM | POA: Diagnosis not present

## 2020-11-03 NOTE — Telephone Encounter (Signed)
Called and left a message for pt or daughter to call back to verify insulin dose. Pt notified via Mychart message to reduce Lantus to 5 units and call with any questions or concerns.

## 2020-11-03 NOTE — Telephone Encounter (Signed)
[name] calling in stating that pt's blood sugar has been running low in the mornings  10/21/2020 -57 10/23/2020 -55 10/30/2020- 72 10/31/2020 -41 11/01/2020- 118 11/03/2020 -68  Pt daughter would like a call back as soon as possible on what to do about her insulin. Patient is very tired in the mornings when it is low.

## 2020-11-03 NOTE — Telephone Encounter (Signed)
Please advise 

## 2020-11-07 DIAGNOSIS — N3000 Acute cystitis without hematuria: Secondary | ICD-10-CM | POA: Diagnosis not present

## 2020-11-13 DIAGNOSIS — E119 Type 2 diabetes mellitus without complications: Secondary | ICD-10-CM | POA: Diagnosis not present

## 2020-11-13 DIAGNOSIS — Z961 Presence of intraocular lens: Secondary | ICD-10-CM | POA: Diagnosis not present

## 2020-11-13 DIAGNOSIS — Z794 Long term (current) use of insulin: Secondary | ICD-10-CM | POA: Diagnosis not present

## 2020-11-13 DIAGNOSIS — H04123 Dry eye syndrome of bilateral lacrimal glands: Secondary | ICD-10-CM | POA: Diagnosis not present

## 2020-11-14 ENCOUNTER — Encounter: Payer: Self-pay | Admitting: Internal Medicine

## 2020-11-14 DIAGNOSIS — R8271 Bacteriuria: Secondary | ICD-10-CM | POA: Diagnosis not present

## 2020-11-14 DIAGNOSIS — N3020 Other chronic cystitis without hematuria: Secondary | ICD-10-CM | POA: Diagnosis not present

## 2020-11-14 DIAGNOSIS — N952 Postmenopausal atrophic vaginitis: Secondary | ICD-10-CM | POA: Diagnosis not present

## 2020-11-14 NOTE — Telephone Encounter (Signed)
[name] [name] called back to confirm message was received and pt's sugars have improved since the adjustment.

## 2020-11-17 ENCOUNTER — Emergency Department (HOSPITAL_COMMUNITY)
Admission: EM | Admit: 2020-11-17 | Discharge: 2020-11-18 | Disposition: A | Discharge status: Home / Self Care | Payer: Medicare Other | Attending: Student | Admitting: Student

## 2020-11-17 ENCOUNTER — Emergency Department (HOSPITAL_COMMUNITY): Payer: Medicare Other

## 2020-11-17 ENCOUNTER — Other Ambulatory Visit: Payer: Self-pay

## 2020-11-17 ENCOUNTER — Encounter (HOSPITAL_COMMUNITY): Payer: Self-pay | Admitting: Emergency Medicine

## 2020-11-17 DIAGNOSIS — R609 Edema, unspecified: Secondary | ICD-10-CM | POA: Insufficient documentation

## 2020-11-17 DIAGNOSIS — Z5321 Procedure and treatment not carried out due to patient leaving prior to being seen by health care provider: Secondary | ICD-10-CM | POA: Diagnosis not present

## 2020-11-17 DIAGNOSIS — R600 Localized edema: Secondary | ICD-10-CM | POA: Diagnosis not present

## 2020-11-17 DIAGNOSIS — I959 Hypotension, unspecified: Secondary | ICD-10-CM | POA: Diagnosis not present

## 2020-11-17 DIAGNOSIS — R0602 Shortness of breath: Secondary | ICD-10-CM | POA: Insufficient documentation

## 2020-11-17 DIAGNOSIS — J439 Emphysema, unspecified: Secondary | ICD-10-CM | POA: Diagnosis not present

## 2020-11-17 LAB — COMPREHENSIVE METABOLIC PANEL
ALT: 16 U/L (ref 0–44)
AST: 15 U/L (ref 15–41)
Albumin: 3 g/dL — ABNORMAL LOW (ref 3.5–5.0)
Alkaline Phosphatase: 37 U/L — ABNORMAL LOW (ref 38–126)
Anion gap: 11 (ref 5–15)
BUN: 31 mg/dL — ABNORMAL HIGH (ref 8–23)
CO2: 22 mmol/L (ref 22–32)
Calcium: 9.5 mg/dL (ref 8.9–10.3)
Chloride: 101 mmol/L (ref 98–111)
Creatinine, Ser: 1.07 mg/dL — ABNORMAL HIGH (ref 0.44–1.00)
GFR, Estimated: 52 mL/min — ABNORMAL LOW (ref >60)
Glucose, Bld: 215 mg/dL — ABNORMAL HIGH (ref 70–99)
Potassium: 4.3 mmol/L (ref 3.5–5.1)
Sodium: 134 mmol/L — ABNORMAL LOW (ref 135–145)
Total Bilirubin: 0.7 mg/dL (ref 0.3–1.2)
Total Protein: 5.9 g/dL — ABNORMAL LOW (ref 6.5–8.1)

## 2020-11-17 LAB — CBC WITH DIFFERENTIAL/PLATELET
Abs Immature Granulocytes: 0 10*3/uL (ref 0.00–0.07)
Basophils Absolute: 0.3 10*3/uL — ABNORMAL HIGH (ref 0.0–0.1)
Basophils Relative: 2 %
Eosinophils Absolute: 0.1 10*3/uL (ref 0.0–0.5)
Eosinophils Relative: 1 %
HCT: 29.4 % — ABNORMAL LOW (ref 36.0–46.0)
Hemoglobin: 9.5 g/dL — ABNORMAL LOW (ref 12.0–15.0)
Lymphocytes Relative: 14 %
Lymphs Abs: 1.8 10*3/uL (ref 0.7–4.0)
MCH: 38.2 pg — ABNORMAL HIGH (ref 26.0–34.0)
MCHC: 32.3 g/dL (ref 30.0–36.0)
MCV: 118.1 fL — ABNORMAL HIGH (ref 80.0–100.0)
Monocytes Absolute: 0.6 10*3/uL (ref 0.1–1.0)
Monocytes Relative: 5 %
Neutro Abs: 9.9 10*3/uL — ABNORMAL HIGH (ref 1.7–7.7)
Neutrophils Relative %: 78 %
Platelets: 351 10*3/uL (ref 150–400)
RBC: 2.49 MIL/uL — ABNORMAL LOW (ref 3.87–5.11)
RDW: 13.6 % (ref 11.5–15.5)
WBC: 12.7 10*3/uL — ABNORMAL HIGH (ref 4.0–10.5)
nRBC: 0 % (ref 0.0–0.2)
nRBC: 0 /100 WBC

## 2020-11-17 LAB — BRAIN NATRIURETIC PEPTIDE: B Natriuretic Peptide: 87.9 pg/mL (ref 0.0–100.0)

## 2020-11-17 NOTE — ED Triage Notes (Signed)
Pt BIB [facility] from home, increased lower extremity edema over last several days, short of breath at baseline.

## 2020-11-17 NOTE — ED Notes (Signed)
Pt stated she was leaving AMA and that she would go to her PCP

## 2020-11-17 NOTE — ED Provider Notes (Signed)
Emergency Medicine Provider Triage Evaluation Note  Kayla Thompson , a 85 y.o. female  was evaluated in triage.  Pt BIB ems due to increased swelling of lower legs. Some sob but she says it is no worse than usual.   Review of Systems  Positive: [place] leg edema Negative: CP, dizziness,   Physical Exam  BP (!) 144/71 (BP Location: Right Arm)   Pulse (!) 103   Temp 97.9 F (36.6 C) (Oral)   Resp 15   LMP  (LMP Unknown)   SpO2 100%  Gen:   Awake, no distress   Resp:  Normal effort, some wheezing MSK:   Moves extremities without difficulty  Other:   2+ edema to bilat knees  Medical Decision Making  Medically screening exam initiated at 5:53 PM.  Appropriate orders placed.  Kayla Thompson was informed that the remainder of the evaluation will be completed by another provider, this initial triage assessment does not replace that evaluation, and the importance of remaining in the ED until their evaluation is complete.     Darliss Ridgel 11/17/20 1803    Teressa Lower, MD 11/18/20 [phone]

## 2020-11-18 ENCOUNTER — Telehealth: Payer: Self-pay

## 2020-11-18 ENCOUNTER — Telehealth: Payer: Self-pay | Admitting: Internal Medicine

## 2020-11-18 NOTE — Telephone Encounter (Signed)
Patient's daughter, [name], is requesting to have Dr. Gasper Sells review the patient's ED notes from 11/17/20.

## 2020-11-18 NOTE — Telephone Encounter (Signed)
Verbal is okay and will be attending.

## 2020-11-18 NOTE — Progress Notes (Signed)
Cardiology Office Note:    Date:  11/20/2020   ID:  Kayla Thompson, DOB [birth year], MRN [id number]  PCP:  [name], MD   Referring MD: [name], *   CC: Follow up ED visit  History of Present Illness:    Kayla Thompson is a 85 y.o. female with a hx of HTN, HFpEF, Chromic Asthma with Granuloamtous Disease and COPD, Type II Diabetes Mellitus, Dysuria, who presents for for leg swelling 12/21/19.  Had Echo with Grade 1 diastolic dysfunction, Had arterial duplex with mild PAD but biphasic flow (elevation in TBI).  In interim had hospitalization for Thrush and sepsis from UTI.  Has had lasix and Coreg stopped in the setting of low BP.  Last seen 01/2020  In interim of this visit, patient had UTI admission 08/04/20.  Recently has 8/29/ ED evaluation for SOB and Leg Swelling. Seen 11/20/20.  Patient notes that she is feeling fine.  Always has trouble breathing. She says she can walk half a block.  No chest pain or pressure .  Feels principally DOE.  No PND/Orthopnea.  No leg swelling better with elevation.  No palpitations or syncope.  Slight weight loss.  Daughter notes that she has lost weight from 115-> 105 lbs (was 120 lbs at the beginning of the year).  Has lost some of her appetite. Started the lasix 40 mg PO Daily with improvement in symptoms.  All of this has improved her symptoms.  Patient is sleeping more in the chair that in the bed.    Her nurse Inez Catalina notes no additional concerns.     Past Medical History:  Diagnosis Date   Anxiety    Arthritis    Asthma    Cataracts, bilateral    removed by surgery   COPD (chronic obstructive pulmonary disease) ([place])    Depression    Diabetes mellitus without complication (HCC)    type 2   Dyspnea    with exertion   Fibromyalgia    GERD (gastroesophageal reflux disease)    History of chemotherapy    History of fractured pelvis    Hypertension    Hypothyroidism    Osteoporosis 12/2017   T score -2.8 stable from  prior DEXA   Osteoporosis    Ovarian cancer ([place])    Pneumonia    several times    Past Surgical History:  Procedure Laterality Date   ABDOMINAL HYSTERECTOMY  1996   ANKLE FRACTURE SURGERY     plate and screws   BIOPSY  02/05/2019   Procedure: BIOPSY;  Surgeon: Irving Copas., MD;  Location: [facility] ENDOSCOPY;  Service: Gastroenterology;;   BLADDER SUSPENSION     CATARACT EXTRACTION     COLONOSCOPY     ESOPHAGEAL BRUSHING  02/05/2019   Procedure: ESOPHAGEAL BRUSHING;  Surgeon: Irving Copas., MD;  Location: [facility] ENDOSCOPY;  Service: Gastroenterology;;   ESOPHAGOGASTRODUODENOSCOPY (EGD) WITH PROPOFOL N/A 02/05/2019   Procedure: ESOPHAGOGASTRODUODENOSCOPY (EGD) WITH PROPOFOL with dialtion;  Surgeon: Irving Copas., MD;  Location: [place];  Service: Gastroenterology;  Laterality: N/A;   EYE SURGERY     cataracts removed-bilateral   hip surgey     knee surgey     LUNG SURGERY     OOPHORECTOMY     BSO   SAVORY DILATION N/A 02/05/2019   Procedure: SAVORY DILATION;  Surgeon: Irving Copas., MD;  Location: [place];  Service: Gastroenterology;  Laterality: N/A;   TONSILLECTOMY     UPPER GI  ENDOSCOPY     Current Medications: Current Meds  Medication Sig   acetaminophen (TYLENOL) 650 MG CR tablet Take 1,300 mg by mouth every 4 (four) hours.   albuterol (PROAIR HFA) 108 (90 Base) MCG/ACT inhaler Inhale 2 puffs into the lungs every 6 (six) hours as needed for wheezing or shortness of breath.   amitriptyline (ELAVIL) 10 MG tablet TAKE 1 TABLET BY MOUTH EVERYDAY AT BEDTIME   Apoaequorin (PREVAGEN PO) Take 1 tablet by mouth daily.   Ascorbic Acid (VITAMIN C) 1000 MG tablet Take 1,000 mg by mouth 2 (two) times daily.   baclofen (LIORESAL) 10 MG tablet TAKE 0.5-1 TABLET BY MOUTH TWICE DAILY AS NEEDED FOR MUSCLE SPASMS OR PAIN (Patient taking differently: 10 mg daily. TAKE 0.5-1 TABLET BY MOUTH TWICE DAILY AS NEEDED FOR MUSCLE SPASMS OR PAIN)   BD PEN  NEEDLE NANO 2ND GEN 32G X 4 MM MISC USE AS DIRECTED 4 TIMES A DAY   busPIRone (BUSPAR) 5 MG tablet TAKE 1 TO 2 TABLETS BY MOUTH TWICE A DAY (Patient taking differently: as needed.)   calcium carbonate (OSCAL) 1500 (600 Ca) MG TABS tablet Take 600 mg of elemental calcium by mouth 2 (two) times daily with a meal.   cholecalciferol (VITAMIN D3) 25 MCG (1000 UNIT) tablet Take 1,000 Units by mouth daily.   clotrimazole-betamethasone (LOTRISONE) cream APPLY TO AFFECTED AREA TWICE A DAY (Patient taking differently: as needed.)   Cyanocobalamin (VITAMIN B 12 PO) Take 2,500 mg by mouth once a week. Takes 2 times a week - Tuesdays and Thursdays   DULoxetine (CYMBALTA) 30 MG capsule Take 1 capsule (30 mg total) by mouth daily.   DULoxetine (CYMBALTA) 60 MG capsule Take 1 capsule (60 mg total) by mouth daily.   estradiol (ESTRACE) 0.1 MG/GM vaginal cream Place 1 Applicatorful vaginally See admin instructions. Takes 2 times a week   feeding supplement (ENSURE ENLIVE / ENSURE PLUS) LIQD Take 237 mLs by mouth 2 (two) times daily between meals.   furosemide (LASIX) 40 MG tablet Take 1 tablet (40 mg total) by mouth daily.   glucose blood (FREESTYLE LITE) test strip USE TO TEST BLOOD SUGAR 3 TIMES DAILY.   ipratropium-albuterol (DUONEB) 0.5-2.5 (3) MG/3ML SOLN USE 1 VIAL IN NEBULIZER 3 TIMES DAILY   Lancets (FREESTYLE) lancets Use to test blood sugar 3 times daily. Dx: E11.65   LANTUS SOLOSTAR 100 UNIT/ML Solostar Pen INJECT 20 UNITS INTO THE SKIN EVERY MORNING.   levothyroxine (SYNTHROID) 50 MCG tablet TAKE 1 TABLET BY MOUTH EVERY DAY BEFORE BREAKFAST   lidocaine (XYLOCAINE) 5 % ointment Apply 1 application topically as needed. Apply to external vulvar area   Lidocaine 4 % PTCH Apply 1 patch topically 2 (two) times daily as needed (pain).   magnesium oxide (MAG-OX) 400 MG tablet TAKE 1 TABLET BY MOUTH EVERY DAY   methenamine (MANDELAMINE) 1 g tablet Take 1,000 mg by mouth 2 (two) times daily.   Misc Natural  Products (TART CHERRY ADVANCED PO) Take 1 tablet by mouth daily.   montelukast (SINGULAIR) 10 MG tablet TAKE 1 TABLET BY MOUTH EVERY DAY   Multiple Vitamin (MULTIVITAMIN WITH MINERALS) TABS tablet Take 1 tablet by mouth daily. Centrum   naloxone (NARCAN) nasal spray 4 mg/0.1 mL Place 1 spray into the nose once as needed (overdose).   omeprazole (PRILOSEC) 20 MG capsule TAKE 1 CAPSULE TWICE A DAY BEFORE A MEAL   polyethylene glycol (MIRALAX / GLYCOLAX) 17 g packet Take 17 g by mouth daily.  predniSONE (DELTASONE) 1 MG tablet Take 2 tablets (2 mg total) by mouth daily.   Pseudoephedrine-guaiFENesin (MUCINEX D MAX STRENGTH PO) Take 1,200 mg by mouth in the morning and at bedtime.   rosuvastatin (CRESTOR) 10 MG tablet Take 1 tablet (10 mg total) by mouth every evening.   TRELEGY ELLIPTA 100-62.5-25 MCG/INH AEPB INHALE 1 PUFF INTO THE LUNGS DAILY. RINSE MOUTH   TURMERIC PO Take 1 tablet by mouth daily.   [DISCONTINUED] ferrous gluconate (FERGON) 324 MG tablet TAKE 1 TABLET BY MOUTH EVERY DAY WITH BREAKFAST   [DISCONTINUED] HYDROcodone-acetaminophen (NORCO) 10-325 MG tablet Take 1 tablet by mouth every 4 (four) hours.   [DISCONTINUED] Phenylephrine-APAP-guaiFENesin (MUCINEX FAST-MAX) 10-650-400 MG/20ML LIQD Take 20 mLs by mouth at bedtime.    Allergies:   Latex and Penicillins   Social History   Socioeconomic History   Marital status: Widowed    Spouse name: Not on file   Number of children: 2   Years of education: Not on file   Highest education level: Not on file  Occupational History   Not on file  Tobacco Use   Smoking status: Never   Smokeless tobacco: Never  Vaping Use   Vaping Use: Never used  Substance and Sexual Activity   Alcohol use: No    Alcohol/week: 0.0 standard drinks   Drug use: No   Sexual activity: Not Currently    Birth control/protection: Post-menopausal    Comment: declined sexual Hx questions  Other Topics Concern   Not on file  Social History Narrative    Not on file   Social Determinants of Health   Financial Resource Strain: Low Risk    Difficulty of Paying Living Expenses: Not hard at all  Food Insecurity: No Food Insecurity   Worried About Charity fundraiser in the Last Year: Never true   [place] in the Last Year: Never true  Transportation Needs: No Transportation Needs   Lack of Transportation (Medical): No   Lack of Transportation (Non-Medical): No  Physical Activity: Insufficiently Active   Days of Exercise per Week: 2 days   Minutes of Exercise per Session: 30 min  Stress: No Stress Concern Present   Feeling of Stress : Not at all  Social Connections: Not on file    Family History: The patient's family history includes CAD in her father; Diabetes in her father, paternal aunt, paternal grandfather, paternal grandmother, and paternal uncle; Lung cancer in her maternal aunt and mother. There is no history of Colon cancer, Esophageal cancer, Inflammatory bowel disease, Liver disease, Pancreatic cancer, Rectal cancer, or Stomach cancer.  ROS:   Please see the history of present illness.    Notes constipation All other systems reviewed and are negative.  EKGs/Labs/Other Studies Reviewed:    The following studies were reviewed today:  EKG:  11/20/20: Sinus Tachycardia rate 101 No ST T changes (diffusely flat) 10/16/19 SR, 79, No ST/T changes  Transthoracic Echocardiogram: Date:01/08/2020 Results:  1. Left ventricular ejection fraction, by estimation, is 55 to 60%. The  left ventricle has normal function. The left ventricle has no regional  wall motion abnormalities. Left ventricular diastolic parameters are  consistent with Grade I diastolic  dysfunction (impaired relaxation).   2. Right ventricular systolic function is normal. The right ventricular  size is normal.   3. The mitral valve is normal in structure. No evidence of mitral valve  regurgitation. No evidence of mitral stenosis. Moderate mitral annular   calcification.   4. The  aortic valve is calcified. There is moderate calcification of the  aortic valve. There is moderate thickening of the aortic valve. Aortic  valve regurgitation is not visualized. Mild aortic valve stenosis. Aortic  valve area, by VTI measures 1.39  cm. Aortic valve mean gradient measures 10.0 mmHg. Aortic valve Vmax  measures 2.00 m/s.   5. The inferior vena cava is normal in size with greater than 50%  respiratory variability, suggesting right atrial pressure of 3 mmHg.  Recent Labs: 01/25/2020: Pro B Natriuretic peptide (BNP) 103.0 05/07/2020: TSH 0.681 08/01/2020: Magnesium 2.0 11/17/2020: ALT 16; B Natriuretic Peptide 87.9; BUN 31; Creatinine, Ser 1.07; Hemoglobin 9.5; Platelets 351; Potassium 4.3; Sodium 134   Albumin 4.0 UA Protein negative of 10/12/19 TSH 0.76 with Free T4 1.1  Recent Lipid Panel    Component Value Date/Time   CHOL 132 10/16/2019 1506   TRIG 114 10/16/2019 1506   HDL 55 10/16/2019 1506   CHOLHDL 2.4 10/16/2019 1506   VLDL 26.0 10/12/2018 1517   LDLCALC 57 10/16/2019 1506    Physical Exam:    VS:   Today's Vitals   11/20/20 1355  BP: 114/60  Pulse: (!) 115  SpO2: 94%  Weight: 105 lb (47.6 kg)    Body mass index is 21.21 kg/m.  Wt Readings from Last 3 Encounters:  11/20/20 105 lb (47.6 kg)  07/30/20 116 lb 13.5 oz (53 kg)  07/15/20 116 lb 6.4 oz (52.8 kg)    GEN: Elderly female  in no acute distress HEENT: Normal NECK: No JVD; LYMPHATICS: No lymphadenopathy CARDIAC: Regular tachycardia no rubs, gallops RESPIRATORY:  Decreased breath sound in right sided lung fields ABDOMEN: Soft, non-tender, non-distended MUSCULOSKELETAL: 1+  edema bilaterally ulceration now gone  SKIN: Warm and dry NEUROLOGIC:  Alert and oriented x 3 PSYCHIATRIC:  Normal affect   ASSESSMENT:    1. Chronic heart failure with preserved ejection fraction ([place])   2. Decreased breath sounds   3. Chronic obstructive pulmonary disease, unspecified  COPD type ([place])     PLAN:    In order of problems listed above:  HFpEF COPD PAD (by TBI's) Diabetes with HTN Mild cognitive impairment - Restarted lasix 40 mg PO daily - compression stockings - will check EKG, CXR (predominantly right sided breath sounds; though 11/17/20 no consolidate) and BMP, BNP, MG early next week - we have discussed red flags requiring ED evaluation  - suspect there is both a COPD and fluid component to her shortness of breath - if CXR is similar to 11/17/20 will repeat echo  6-8 weeks follow up; APP OK   Medication Adjustments/Labs and Tests Ordered: Current medicines are reviewed at length with the patient today.  Concerns regarding medicines are outlined above.  Orders Placed This Encounter  Procedures   DG Chest 2 View   Basic metabolic panel   Pro b natriuretic peptide (BNP)   Magnesium    Meds ordered this encounter  Medications   furosemide (LASIX) 40 MG tablet    Sig: Take 1 tablet (40 mg total) by mouth daily.    Dispense:  90 tablet    Refill:  3     Patient Instructions  Medication Instructions:  Your physician has recommended you make the following change in your medication: START: furosemide (Lasix) 40 mg by mouth daily  *If you need a refill on your cardiac medications before your next appointment, please call your pharmacy*   Lab Work: IN 7-10 days: BMET, BNP, Mg If you have labs (  blood work) drawn today and your tests are completely normal, you will receive your results only by: [place] (if you have MyChart) OR A paper copy in the mail If you have any lab test that is abnormal or we need to change your treatment, we will call you to review the results.   Testing/Procedures: A chest x-ray takes a picture of the organs and structures inside the chest, including the heart, lungs, and blood vessels. This test can show several things, including, whether the heart is enlarges; whether fluid is building up in the lungs;  and whether pacemaker / defibrillator leads are still in place.  Chest X-ray Instructions:    1. You may have this done at the [facility], located in the [place] on the 1st floor.    2. You do no have to have an appointment.    3. [place], [place] [address]        [phone]        Monday - Friday  8:00 am - 5:00 pm    Follow-Up: At [facility], you and your health needs are our priority.  As part of our continuing mission to provide you with exceptional heart care, we have created designated Provider Care Teams.  These Care Teams include your primary Cardiologist (physician) and Advanced Practice Providers (APPs -  Physician Assistants and Nurse Practitioners) who all work together to provide you with the care you need, when you need it.  We recommend signing up for the patient portal called "MyChart".  Sign up information is provided on this After Visit Summary.  MyChart is used to connect with patients for Virtual Visits (Telemedicine).  Patients are able to view lab/test results, encounter notes, upcoming appointments, etc.  Non-urgent messages can be sent to your provider as well.   To learn more about what you can do with MyChart, go to [email or web address].    Your next appointment:   6-8 week(s)  The format for your next appointment:   In Person  Provider:   You may see Werner Lean, MD or one of the following Advanced Practice Providers on your designated Care Team:   Melina Copa, PA-C Ermalinda Barrios, PA-C   Other Instructions .c    Signed, Werner Lean, MD  11/20/2020 2:29 PM    [facility]

## 2020-11-18 NOTE — Telephone Encounter (Signed)
Spoke with the patient's daughter who states that the patient went to the ER last night but left after waiting 8 hours. States they did lab work and and x-ray prior to her leaving. The daughter reports that the patient has had some increased swelling in her legs. She is very fatigued and not able to eat much. The patient is scheduled to see Dr. Gasper Sells on 9/1 and I have advised the daughter on return to ER precautions. She verbalized understanding.

## 2020-11-18 NOTE — Telephone Encounter (Signed)
See below

## 2020-11-18 NOTE — Telephone Encounter (Signed)
Ebony Hail is calling in on behalf of patient  Patient daughter is requesting a hospice evaluation  Wanting to get verbal okay from provider & also verify provider will be attending on behalf of patient  [phone]

## 2020-11-18 NOTE — Telephone Encounter (Signed)
[name] [name] [phone] is returning a call from this morning.

## 2020-11-18 NOTE — Telephone Encounter (Signed)
Kayla Thompson (pt daughter) is request recommendations and referrals for Hospice care. Please do not discuss in front of patient.  Kayla Thompson can be reached at [phone]

## 2020-11-18 NOTE — Telephone Encounter (Signed)
Attempted to call the pts daughter [name] (on [place]) back, and phone picked up, but nobody answered.

## 2020-11-18 NOTE — Telephone Encounter (Signed)
Noted  

## 2020-11-18 NOTE — Telephone Encounter (Signed)
Verbal orders given to [facility].

## 2020-11-18 NOTE — Telephone Encounter (Signed)
Advised patient's daughter on recommendations from Dr. Gasper Sells. She verbalized understanding and will check to see if she still has Lasix and will restart it if so. If not she will let us know. Once again ER precautions have been given.

## 2020-11-20 ENCOUNTER — Other Ambulatory Visit: Payer: Self-pay

## 2020-11-20 ENCOUNTER — Ambulatory Visit (INDEPENDENT_AMBULATORY_CARE_PROVIDER_SITE_OTHER): Payer: Medicare Other | Admitting: Internal Medicine

## 2020-11-20 ENCOUNTER — Telehealth (INDEPENDENT_AMBULATORY_CARE_PROVIDER_SITE_OTHER): Payer: Medicare Other | Admitting: Internal Medicine

## 2020-11-20 ENCOUNTER — Encounter: Payer: Self-pay | Admitting: Internal Medicine

## 2020-11-20 VITALS — BP 114/60 | HR 115 | Ht 59.0 in | Wt 105.0 lb

## 2020-11-20 DIAGNOSIS — I5032 Chronic diastolic (congestive) heart failure: Secondary | ICD-10-CM

## 2020-11-20 DIAGNOSIS — E43 Unspecified severe protein-calorie malnutrition: Secondary | ICD-10-CM | POA: Diagnosis not present

## 2020-11-20 DIAGNOSIS — D72828 Other elevated white blood cell count: Secondary | ICD-10-CM | POA: Diagnosis not present

## 2020-11-20 DIAGNOSIS — J449 Chronic obstructive pulmonary disease, unspecified: Secondary | ICD-10-CM | POA: Diagnosis not present

## 2020-11-20 DIAGNOSIS — M353 Polymyalgia rheumatica: Secondary | ICD-10-CM | POA: Diagnosis not present

## 2020-11-20 DIAGNOSIS — R0689 Other abnormalities of breathing: Secondary | ICD-10-CM | POA: Diagnosis not present

## 2020-11-20 MED ORDER — FUROSEMIDE 40 MG PO TABS: 40.0000 mg | ORAL_TABLET | Freq: Every day | ORAL | 3 refills | Status: DC

## 2020-11-20 NOTE — Patient Instructions (Signed)
Medication Instructions:  Your physician has recommended you make the following change in your medication: START: furosemide (Lasix) 40 mg by mouth daily  *If you need a refill on your cardiac medications before your next appointment, please call your pharmacy*   Lab Work: IN 7-10 days: BMET, BNP, Mg If you have labs (blood work) drawn today and your tests are completely normal, you will receive your results only by: [place] (if you have MyChart) OR A paper copy in the mail If you have any lab test that is abnormal or we need to change your treatment, we will call you to review the results.   Testing/Procedures: A chest x-ray takes a picture of the organs and structures inside the chest, including the heart, lungs, and blood vessels. This test can show several things, including, whether the heart is enlarges; whether fluid is building up in the lungs; and whether pacemaker / defibrillator leads are still in place.  Chest X-ray Instructions:    1. You may have this done at the [facility], located in the [place] on the 1st floor.    2. You do no have to have an appointment.    3. [place],  [id number]        [phone]        Monday - Friday  8:00 am - 5:00 pm    Follow-Up: At [facility], you and your health needs are our priority.  As part of our continuing mission to provide you with exceptional heart care, we have created designated Provider Care Teams.  These Care Teams include your primary Cardiologist (physician) and Advanced Practice Providers (APPs -  Physician Assistants and Nurse Practitioners) who all work together to provide you with the care you need, when you need it.  We recommend signing up for the patient portal called "MyChart".  Sign up information is provided on this After Visit Summary.  MyChart is used to connect with patients for Virtual Visits (Telemedicine).  Patients are able to view  lab/test results, encounter notes, upcoming appointments, etc.  Non-urgent messages can be sent to your provider as well.   To learn more about what you can do with MyChart, go to [email or web address].    Your next appointment:   6-8 week(s)  The format for your next appointment:   In Person  Provider:   You may see Werner Lean, MD or one of the following Advanced Practice Providers on your designated Care Team:   Melina Copa, PA-C Ermalinda Barrios, PA-C   Other Instructions .c

## 2020-11-20 NOTE — Progress Notes (Signed)
Virtual Visit via Video Note  I connected with Kayla Thompson on 11/20/20 at 11:00 AM EDT by a video enabled telemedicine application and verified that I am speaking with the correct person using two identifiers.  The patient and the provider were at separate locations throughout the entire encounter. Patient location: home, Provider location: work   I discussed the limitations of evaluation and management by telemedicine and the availability of in person appointments. The patient expressed understanding and agreed to proceed. The patient and the provider were the only parties present for the visit unless noted in HPI below.  History of Present Illness: The patient is a 85 y.o. female with visit for ER follow up was taken there due to some concern for leg swelling and fluid in lungs. Labs and CXR done without changes. Patient and daughter wondering about elevated WBC.   Observations/Objective: Appearance: sitting in chair eating, breathing appears normal for patient, casual grooming, abdomen does not appear distended, memory normal for patient  Assessment and Plan: See problem oriented charting  Follow Up Instructions: referral to home health for PT  I discussed the assessment and treatment plan with the patient. The patient was provided an opportunity to ask questions and all were answered. The patient agreed with the plan and demonstrated an understanding of the instructions.   The patient was advised to call back or seek an in-person evaluation if the symptoms worsen or if the condition fails to improve as anticipated.  [name], MD

## 2020-11-20 NOTE — Addendum Note (Signed)
Addended by: Jeremy Johann on: 11/20/2020 05:25 PM   Modules accepted: Orders

## 2020-11-21 ENCOUNTER — Encounter: Payer: Self-pay | Admitting: Internal Medicine

## 2020-11-21 NOTE — Assessment & Plan Note (Signed)
She is eating slightly better but is still down about 5-10 pounds in the last 3 months. Talked to patient and daughter about high calorie dense foods and ensure/boost to help.

## 2020-11-21 NOTE — Addendum Note (Signed)
Addended by: Jeremy Johann on: 11/21/2020 04:20 PM   Modules accepted: Orders

## 2020-11-21 NOTE — Assessment & Plan Note (Signed)
No flare today. Needs home health PT due to deconditioning she is at high fall risk. No swelling noted on exam today. Continue lasix.

## 2020-11-21 NOTE — Assessment & Plan Note (Signed)
Taking prednisone 2 mg daily and stable overall.

## 2020-11-21 NOTE — Assessment & Plan Note (Signed)
We talked about how her chronic prednisone usage causes her to run mild elevated WBC chronically and this is stable to her baseline.

## 2020-11-23 ENCOUNTER — Telehealth: Payer: Self-pay | Admitting: Physician Assistant

## 2020-11-23 NOTE — Telephone Encounter (Signed)
Patient's daughter paged after hour answering service and increased confusion since yesterday.  Patient was restarted on 40 mg daily of Lasix recently.  Since starting on the Lasix, daughter has noted increased confusion.  Recent blood work showed elevated creatinine at 1.0, previous creatinine at 0.6.  Low sodium level.  I informed the daughter that with increased confusion, best choice is to go to the emergency room.  Daughter adamantly refused.  She said the patient was recently in the emergency room and stayed for 12 hours without being seen.  Daughter also mentions patient will refuse to go to the emergency room as well knowing the potential risk other than increased confusion.   Since the patient refused to go to the ED, I instructed her to hold Lasix for 2 days before restarting on the lower dose of 20 mg.  I emphasized multiple times that going to the emergency room for blood work and physical exam would still be the best option in this case.  Daughter says that she understand and will go to the emergency room if her condition does not get any better or worsens.

## 2020-11-24 DIAGNOSIS — Z20822 Contact with and (suspected) exposure to covid-19: Secondary | ICD-10-CM | POA: Diagnosis not present

## 2020-11-25 ENCOUNTER — Telehealth: Payer: Self-pay

## 2020-11-25 NOTE — Telephone Encounter (Signed)
Kayla Thompson with [facility] stated the pt has declined Hospice Care and wants to enroll in their Palliative program. Needs Dr. Nathanial Millman OK to go forth with having pt go into Palliative care instead of hospice.  Kayla Thompson can be reached at [phone].

## 2020-11-25 NOTE — Telephone Encounter (Signed)
See below

## 2020-11-25 NOTE — Telephone Encounter (Signed)
Fine with me

## 2020-11-26 ENCOUNTER — Other Ambulatory Visit: Payer: Self-pay

## 2020-11-26 ENCOUNTER — Other Ambulatory Visit: Payer: Medicare Other

## 2020-11-26 ENCOUNTER — Ambulatory Visit
Admission: RE | Admit: 2020-11-26 | Discharge: 2020-11-26 | Disposition: A | Discharge status: Home / Self Care | Payer: Medicare Other | Source: Ambulatory Visit | Attending: Internal Medicine | Admitting: Internal Medicine

## 2020-11-26 DIAGNOSIS — M19012 Primary osteoarthritis, left shoulder: Secondary | ICD-10-CM | POA: Diagnosis not present

## 2020-11-26 DIAGNOSIS — S2243XA Multiple fractures of ribs, bilateral, initial encounter for closed fracture: Secondary | ICD-10-CM | POA: Diagnosis not present

## 2020-11-26 DIAGNOSIS — I5032 Chronic diastolic (congestive) heart failure: Secondary | ICD-10-CM

## 2020-11-26 DIAGNOSIS — R0689 Other abnormalities of breathing: Secondary | ICD-10-CM

## 2020-11-26 DIAGNOSIS — M19011 Primary osteoarthritis, right shoulder: Secondary | ICD-10-CM | POA: Diagnosis not present

## 2020-11-27 ENCOUNTER — Other Ambulatory Visit: Payer: Medicare Other

## 2020-11-27 LAB — BASIC METABOLIC PANEL
BUN/Creatinine Ratio: 27 (ref 12–28)
BUN: 30 mg/dL — ABNORMAL HIGH (ref 8–27)
CO2: 21 mmol/L (ref 20–29)
Calcium: 9.6 mg/dL (ref 8.7–10.3)
Chloride: 99 mmol/L (ref 96–106)
Creatinine, Ser: 1.12 mg/dL — ABNORMAL HIGH (ref 0.57–1.00)
Glucose: 168 mg/dL — ABNORMAL HIGH (ref 65–99)
Potassium: 4.6 mmol/L (ref 3.5–5.2)
Sodium: 140 mmol/L (ref 134–144)
eGFR: 49 mL/min/{1.73_m2} — ABNORMAL LOW (ref >59)

## 2020-11-27 LAB — PRO B NATRIURETIC PEPTIDE: NT-Pro BNP: 291 pg/mL (ref 0–738)

## 2020-11-27 LAB — MAGNESIUM: Magnesium: 2 mg/dL (ref 1.6–2.3)

## 2020-11-27 MED ORDER — FUROSEMIDE 20 MG PO TABS
20.0000 mg | ORAL_TABLET | Freq: Every day | ORAL | 3 refills | Status: DC
Start: 2020-11-27 — End: 2021-01-12

## 2020-11-27 NOTE — Telephone Encounter (Signed)
Called Manus Gunning. LDVM on her confidential giving the verbal orders for the patient to be placed on Palliative Care instead of Hospice. Office number was provided in case she has additional questions or concerns.

## 2020-11-27 NOTE — Addendum Note (Signed)
Addended by: Precious Gilding on: 11/27/2020 11:10 AM   Modules accepted: Orders

## 2020-11-27 NOTE — Telephone Encounter (Signed)
Called [name] (ok per DPR) to follow up on after hour call in.  Dtr reports that pt is doing better.  She thinks that pt may have been dehydrated.  She ensured that pt increased her PO intake over the weekend and confusion has improved.  She also notes that pt has a yeast infection and feels this may have contributed to confusion also.  Dtr questioned if pt should continue on furosemide 40 mg.  I informed her that per Almyra Deforest, PA note she is to decrease to furosemide 20 mg PO QD.  Dtr expressed that she was happy to hear this as she felt that 40 mg was to much.  She is not currently with pt but will call caregiver to check in on pt and ensure that she is only taking furosemide 20 mg PO QD.  Dtr advised to call back if anything further is needed.

## 2020-12-01 ENCOUNTER — Telehealth: Payer: Self-pay

## 2020-12-01 NOTE — Telephone Encounter (Signed)
Pts daughter calling in, per daughter pt is on water pill/lasix and pt glucose is running high. 120-150 in the A.M. and in the evenings 270-370. Please call [name] (daughter) and advise what to do [phone].

## 2020-12-01 NOTE — Telephone Encounter (Signed)
Called and spoke with pt's daughter Kayla Thompson and she advised her mom is currently taking 5 units of Lantus. Pt wanted to know if the pt's recent medication add (Lasix) can be attributing to the high blood sugar readings. Please advise

## 2020-12-02 NOTE — Telephone Encounter (Signed)
T, It looks like they may need to add back NovoLog or Humalog, whichever covered by her insurance.  I would suggest to take 2 to 3 units before dinner and see how the sugars are doing afterwards.  I am not sure whether she needs to take it with the rest of the meals, but we can start with dinner.  Please call into her pharmacy one of the above short acting insulins at 15 ml with 5 refills.

## 2020-12-02 NOTE — Telephone Encounter (Signed)
Called and advised daughter [name] that pt may need to add meal time insulin with dinner to help with her evening highs, [name] explained pt was drinking juices and soda after dinner when blood sugar was checked and that could be the reason readings were high. Pt wants to monitor evening readings closely before adding dinner insulin. [name] will call back with readings in a few days.

## 2020-12-08 DIAGNOSIS — Z7951 Long term (current) use of inhaled steroids: Secondary | ICD-10-CM | POA: Diagnosis not present

## 2020-12-08 DIAGNOSIS — Z79818 Long term (current) use of other agents affecting estrogen receptors and estrogen levels: Secondary | ICD-10-CM | POA: Diagnosis not present

## 2020-12-08 DIAGNOSIS — M353 Polymyalgia rheumatica: Secondary | ICD-10-CM | POA: Diagnosis not present

## 2020-12-08 DIAGNOSIS — M150 Primary generalized (osteo)arthritis: Secondary | ICD-10-CM | POA: Diagnosis not present

## 2020-12-08 DIAGNOSIS — E1151 Type 2 diabetes mellitus with diabetic peripheral angiopathy without gangrene: Secondary | ICD-10-CM | POA: Diagnosis not present

## 2020-12-08 DIAGNOSIS — M810 Age-related osteoporosis without current pathological fracture: Secondary | ICD-10-CM | POA: Diagnosis not present

## 2020-12-08 DIAGNOSIS — J449 Chronic obstructive pulmonary disease, unspecified: Secondary | ICD-10-CM | POA: Diagnosis not present

## 2020-12-08 DIAGNOSIS — E039 Hypothyroidism, unspecified: Secondary | ICD-10-CM | POA: Diagnosis not present

## 2020-12-08 DIAGNOSIS — E213 Hyperparathyroidism, unspecified: Secondary | ICD-10-CM | POA: Diagnosis not present

## 2020-12-08 DIAGNOSIS — Z794 Long term (current) use of insulin: Secondary | ICD-10-CM | POA: Diagnosis not present

## 2020-12-08 DIAGNOSIS — Z8631 Personal history of diabetic foot ulcer: Secondary | ICD-10-CM | POA: Diagnosis not present

## 2020-12-08 DIAGNOSIS — D509 Iron deficiency anemia, unspecified: Secondary | ICD-10-CM | POA: Diagnosis not present

## 2020-12-08 DIAGNOSIS — F411 Generalized anxiety disorder: Secondary | ICD-10-CM | POA: Diagnosis not present

## 2020-12-08 DIAGNOSIS — E1159 Type 2 diabetes mellitus with other circulatory complications: Secondary | ICD-10-CM | POA: Diagnosis not present

## 2020-12-08 DIAGNOSIS — J8410 Pulmonary fibrosis, unspecified: Secondary | ICD-10-CM | POA: Diagnosis not present

## 2020-12-08 DIAGNOSIS — Z87440 Personal history of urinary (tract) infections: Secondary | ICD-10-CM | POA: Diagnosis not present

## 2020-12-08 DIAGNOSIS — I5032 Chronic diastolic (congestive) heart failure: Secondary | ICD-10-CM | POA: Diagnosis not present

## 2020-12-08 DIAGNOSIS — I152 Hypertension secondary to endocrine disorders: Secondary | ICD-10-CM | POA: Diagnosis not present

## 2020-12-08 DIAGNOSIS — E1169 Type 2 diabetes mellitus with other specified complication: Secondary | ICD-10-CM | POA: Diagnosis not present

## 2020-12-08 DIAGNOSIS — K219 Gastro-esophageal reflux disease without esophagitis: Secondary | ICD-10-CM | POA: Diagnosis not present

## 2020-12-08 DIAGNOSIS — G8929 Other chronic pain: Secondary | ICD-10-CM | POA: Diagnosis not present

## 2020-12-08 DIAGNOSIS — E43 Unspecified severe protein-calorie malnutrition: Secondary | ICD-10-CM | POA: Diagnosis not present

## 2020-12-08 DIAGNOSIS — E785 Hyperlipidemia, unspecified: Secondary | ICD-10-CM | POA: Diagnosis not present

## 2020-12-08 DIAGNOSIS — F32A Depression, unspecified: Secondary | ICD-10-CM | POA: Diagnosis not present

## 2020-12-08 DIAGNOSIS — Z9181 History of falling: Secondary | ICD-10-CM | POA: Diagnosis not present

## 2020-12-09 ENCOUNTER — Telehealth: Payer: Self-pay | Admitting: Internal Medicine

## 2020-12-09 NOTE — Telephone Encounter (Signed)
Called pt daughter okay per DPR.  She expresses that pt has had decreased UOP.  I asked how pt UOP is measured.  She reports that pt is incontinent and briefs have been dry for most of the day. Caregiver reports that pt feels that she will pass out while walking.  Caregiver did have to lower pt to the floor and have EMS help get her up.  I was told that VS were normal while EMS was present.  Daughter has requested that pt not take furosemide.  I advised her to call in if pt is noted with increased swelling to lower extremities, SOB.  I also advised her to ensure that pt is getting adequate PO intake (2L max per day).  Daughter verbalizes understanding.  Also expressed that she is going to reach out to pt other specialist for input.  Will route to MD to review.

## 2020-12-09 NOTE — Telephone Encounter (Signed)
STAT if patient feels like he/she is going to faint   Are you dizzy now? Not with patient  Do you feel faint or have you passed out? no  Do you have any other symptoms? urine  Have you checked your HR and BP (record if available)? States it was all normal, EMT came last night to help get her up from the floor.   Patient's daughter states the patient has felt lightheaded this morning and yesterday. She states the patient almost fell yesterday and this morning, but has not passed out. She states the patient's caregiver was able to help get her to the floor. She says they did call EMS to help get her up from the floor, because the caregiver was not able to. She states the EMS said her BP and vitals were normal. She states her sugar was 160. She says the patients urine output was much lower and has had the same amount of fluids as usual. She states she told the patient not to take lasix this morning. She is not currently with the patient and lives in [place]. She would like a nurse to call her back at: [phone]

## 2020-12-10 NOTE — Telephone Encounter (Signed)
Left a message to call back.

## 2020-12-10 NOTE — Telephone Encounter (Signed)
Spoke with pt daughter ok per DPR.  I advised her Turner, MD recommendation.  She is agreeable to plan.  Pt scheduled on DOD schedule for 12/11/20 at 2:30 pm.  Daughter verbalizes understanding.  Per daughter pt UOP has improved since stopping furosemide and increasing PO intake.

## 2020-12-11 ENCOUNTER — Encounter: Payer: Self-pay | Admitting: Cardiovascular Disease

## 2020-12-11 ENCOUNTER — Ambulatory Visit (INDEPENDENT_AMBULATORY_CARE_PROVIDER_SITE_OTHER): Payer: Medicare Other | Admitting: Cardiovascular Disease

## 2020-12-11 ENCOUNTER — Other Ambulatory Visit: Payer: Self-pay

## 2020-12-11 VITALS — BP 119/77 | HR 96 | Ht 59.0 in

## 2020-12-11 DIAGNOSIS — R0689 Other abnormalities of breathing: Secondary | ICD-10-CM

## 2020-12-11 DIAGNOSIS — I5032 Chronic diastolic (congestive) heart failure: Secondary | ICD-10-CM

## 2020-12-11 DIAGNOSIS — J449 Chronic obstructive pulmonary disease, unspecified: Secondary | ICD-10-CM

## 2020-12-11 NOTE — Progress Notes (Signed)
Cardiology Office Note:    Date:  12/11/2020   ID:  Kayla Thompson, DOB [birth year], MRN [id number]  PCP:  [name], MD   [facility] Providers Cardiologist:  Werner Lean, MD {     Referring MD: [name], *   Chief Complaint  Patient presents with   Congestive Heart Failure     HiSept. 22, 2022   Kayla Thompson is a 85 y.o. female with a hx of CHF. Pt of Dr. Gasper Sells.  Seen for DOD work in visit  Seen with her aid , Jackelyn Poling Daughter , [name] is on the phone   Has been on lasix 40 mg  Dose was reduced to 20 due to altered mental status  Over the past several days, she has become more confused.  Urine output was low. Had near syncope  Lasix has been held for past 4 days  Has had constipation during past several days.    Echo from Oct. 2021 shows normal LV systolic function ,  grade 1 DD  She is thirsty  She coughs frequently when she eats - frequently chokes We will order a OP swallowing evaluation      Past Medical History:  Diagnosis Date   Anxiety    Arthritis    Asthma    Cataracts, bilateral    removed by surgery   COPD (chronic obstructive pulmonary disease) ([place])    Depression    Diabetes mellitus without complication ([place])    type 2   Dyspnea    with exertion   Fibromyalgia    GERD (gastroesophageal reflux disease)    History of chemotherapy    History of fractured pelvis    Hypertension    Hypothyroidism    Osteoporosis 12/2017   T score -2.8 stable from prior DEXA   Osteoporosis    Ovarian cancer ([place])    Pneumonia    several times    Past Surgical History:  Procedure Laterality Date   ABDOMINAL HYSTERECTOMY  1996   ANKLE FRACTURE SURGERY     plate and screws   BIOPSY  02/05/2019   Procedure: BIOPSY;  Surgeon: Irving Copas., MD;  Location: [facility] ENDOSCOPY;  Service: Gastroenterology;;   BLADDER SUSPENSION     CATARACT EXTRACTION     COLONOSCOPY     ESOPHAGEAL BRUSHING   02/05/2019   Procedure: ESOPHAGEAL BRUSHING;  Surgeon: Irving Copas., MD;  Location: [facility] ENDOSCOPY;  Service: Gastroenterology;;   ESOPHAGOGASTRODUODENOSCOPY (EGD) WITH PROPOFOL N/A 02/05/2019   Procedure: ESOPHAGOGASTRODUODENOSCOPY (EGD) WITH PROPOFOL with dialtion;  Surgeon: Irving Copas., MD;  Location: [facility] ENDOSCOPY;  Service: Gastroenterology;  Laterality: N/A;   EYE SURGERY     cataracts removed-bilateral   hip surgey     knee surgey     LUNG SURGERY     OOPHORECTOMY     BSO   SAVORY DILATION N/A 02/05/2019   Procedure: SAVORY DILATION;  Surgeon: Irving Copas., MD;  Location: [facility];  Service: Gastroenterology;  Laterality: N/A;   TONSILLECTOMY     UPPER GI ENDOSCOPY      Current Medications: Current Meds  Medication Sig   acetaminophen (TYLENOL) 650 MG CR tablet Take 1,300 mg by mouth every 4 (four) hours.   albuterol (PROAIR HFA) 108 (90 Base) MCG/ACT inhaler Inhale 2 puffs into the lungs every 6 (six) hours as needed for wheezing or shortness of breath.   amitriptyline (ELAVIL) 10 MG tablet TAKE 1 TABLET BY MOUTH EVERYDAY AT  BEDTIME   Apoaequorin (PREVAGEN PO) Take 1 tablet by mouth daily.   Ascorbic Acid (VITAMIN C) 1000 MG tablet Take 1,000 mg by mouth 2 (two) times daily.   baclofen (LIORESAL) 10 MG tablet TAKE 0.5-1 TABLET BY MOUTH TWICE DAILY AS NEEDED FOR MUSCLE SPASMS OR PAIN   BD PEN NEEDLE NANO 2ND GEN 32G X 4 MM MISC USE AS DIRECTED 4 TIMES A DAY   busPIRone (BUSPAR) 5 MG tablet Take 5 mg by mouth as needed (take 1-2 tabs as needed).   calcium carbonate (OSCAL) 1500 (600 Ca) MG TABS tablet Take 600 mg of elemental calcium by mouth 2 (two) times daily with a meal.   cholecalciferol (VITAMIN D3) 25 MCG (1000 UNIT) tablet Take 1,000 Units by mouth daily.   clotrimazole-betamethasone (LOTRISONE) cream APPLY TO AFFECTED AREA TWICE A DAY   Cyanocobalamin (VITAMIN B 12 PO) Take 2,500 mg by mouth once a week.   denosumab (PROLIA) 60 MG/ML  SOSY injection Inject 60 mg into the skin every 6 (six) months.   docusate sodium (COLACE) 100 MG capsule Take 100 mg by mouth as needed for mild constipation.   DULoxetine (CYMBALTA) 30 MG capsule Take 1 capsule (30 mg total) by mouth daily.   DULoxetine (CYMBALTA) 60 MG capsule Take 1 capsule (60 mg total) by mouth daily.   estradiol (ESTRACE) 0.1 MG/GM vaginal cream Place 1 Applicatorful vaginally See admin instructions. Takes 2 times a week   feeding supplement (ENSURE ENLIVE / ENSURE PLUS) LIQD Take 237 mLs by mouth 2 (two) times daily between meals.   furosemide (LASIX) 20 MG tablet Take 1 tablet (20 mg total) by mouth daily.   gabapentin (NEURONTIN) 100 MG capsule Take 100-200 mg by mouth at bedtime.   glucose blood (FREESTYLE LITE) test strip USE TO TEST BLOOD SUGAR 3 TIMES DAILY.   hydrocortisone 2.5 % ointment Apply topically 2 (two) times daily.   insulin glargine (LANTUS) 100 UNIT/ML Solostar Pen Inject 5 Units into the skin every morning.   ipratropium-albuterol (DUONEB) 0.5-2.5 (3) MG/3ML SOLN USE 1 VIAL IN NEBULIZER 3 TIMES DAILY   Lancets (FREESTYLE) lancets Use to test blood sugar 3 times daily. Dx: E11.65   levothyroxine (SYNTHROID) 50 MCG tablet TAKE 1 TABLET BY MOUTH EVERY DAY BEFORE BREAKFAST   lidocaine (XYLOCAINE) 5 % ointment Apply 1 application topically as needed. Apply to external vulvar area   Lidocaine 4 % PTCH Apply 1 patch topically 2 (two) times daily as needed (pain).   magnesium oxide (MAG-OX) 400 MG tablet TAKE 1 TABLET BY MOUTH EVERY DAY   melatonin 1 MG TABS tablet Take 1 mg by mouth at bedtime as needed.   methenamine (MANDELAMINE) 1 g tablet Take 1,000 mg by mouth 2 (two) times daily.   Misc Natural Products (TART CHERRY ADVANCED PO) Take 1 tablet by mouth daily.   montelukast (SINGULAIR) 10 MG tablet TAKE 1 TABLET BY MOUTH EVERY DAY   Multiple Vitamin (MULTIVITAMIN WITH MINERALS) TABS tablet Take 1 tablet by mouth daily. Centrum   naloxone (NARCAN) nasal  spray 4 mg/0.1 mL Place 1 spray into the nose once as needed (overdose).   nitrofurantoin, macrocrystal-monohydrate, (MACROBID) 100 MG capsule Take 100 mg by mouth daily.   omeprazole (PRILOSEC) 20 MG capsule TAKE 1 CAPSULE TWICE A DAY BEFORE A MEAL   oxyCODONE-acetaminophen (PERCOCET) 7.5-325 MG tablet Take 1 tablet by mouth 5 (five) times daily as needed.   polyethylene glycol (MIRALAX / GLYCOLAX) 17 g packet Take 17 g by  mouth daily.   predniSONE (DELTASONE) 1 MG tablet Take 2 tablets (2 mg total) by mouth daily.   Propylene Glycol (SYSTANE COMPLETE OP) Apply 1-2 drops to eye as needed.   Pseudoephedrine-guaiFENesin (MUCINEX D MAX STRENGTH PO) Take 1,200 mg by mouth in the morning and at bedtime.   rosuvastatin (CRESTOR) 10 MG tablet Take 1 tablet (10 mg total) by mouth every evening.   TRELEGY ELLIPTA 100-62.5-25 MCG/INH AEPB INHALE 1 PUFF INTO THE LUNGS DAILY. RINSE MOUTH   TURMERIC PO Take 1 tablet by mouth daily. Take 1 tab (1000 mg) daily     Allergies:   Latex and Penicillins   Social History   Socioeconomic History   Marital status: Widowed    Spouse name: Not on file   Number of children: 2   Years of education: Not on file   Highest education level: Not on file  Occupational History   Not on file  Tobacco Use   Smoking status: Never   Smokeless tobacco: Never  Vaping Use   Vaping Use: Never used  Substance and Sexual Activity   Alcohol use: No    Alcohol/week: 0.0 standard drinks   Drug use: No   Sexual activity: Not Currently    Birth control/protection: Post-menopausal    Comment: declined sexual Hx questions  Other Topics Concern   Not on file  Social History Narrative   Not on file   Social Determinants of Health   Financial Resource Strain: Low Risk    Difficulty of Paying Living Expenses: Not hard at all  Food Insecurity: No Food Insecurity   Worried About Charity fundraiser in the Last Year: Never true   [place] in the Last Year: Never true   Transportation Needs: No Transportation Needs   Lack of Transportation (Medical): No   Lack of Transportation (Non-Medical): No  Physical Activity: Insufficiently Active   Days of Exercise per Week: 2 days   Minutes of Exercise per Session: 30 min  Stress: No Stress Concern Present   Feeling of Stress : Not at all  Social Connections: Not on file     Family History: The patient's family history includes CAD in her father; Diabetes in her father, paternal aunt, paternal grandfather, paternal grandmother, and paternal uncle; Lung cancer in her maternal aunt and mother. There is no history of Colon cancer, Esophageal cancer, Inflammatory bowel disease, Liver disease, Pancreatic cancer, Rectal cancer, or Stomach cancer.  ROS:   Please see the history of present illness.     All other systems reviewed and are negative.  EKGs/Labs/Other Studies Reviewed:    The following studies were reviewed today:   EKG:    Recent Labs: 05/07/2020: TSH 0.681 11/17/2020: ALT 16; B Natriuretic Peptide 87.9; Hemoglobin 9.5; Platelets 351 11/26/2020: BUN 30; Creatinine, Ser 1.12; Magnesium 2.0; NT-Pro BNP 291; Potassium 4.6; Sodium 140  Recent Lipid Panel    Component Value Date/Time   CHOL 132 10/16/2019 1506   TRIG 114 10/16/2019 1506   HDL 55 10/16/2019 1506   CHOLHDL 2.4 10/16/2019 1506   VLDL 26.0 10/12/2018 1517   LDLCALC 57 10/16/2019 1506     Risk Assessment/Calculations:           Physical Exam:    VS:  BP 119/77   Pulse 96   Ht 4\' 11"  (1.499 m)   LMP  (LMP Unknown)   BMI 21.21 kg/m     Wt Readings from Last 3 Encounters:  11/20/20 105  lb (47.6 kg)  07/30/20 116 lb 13.5 oz (53 kg)  07/15/20 116 lb 6.4 oz (52.8 kg)     GEN:  elderly , frail female  HEENT: Normal NECK: No JVD; No carotid bruits LYMPHATICS: No lymphadenopathy CARDIAC: RRR, no murmurs, rubs, gallops RESPIRATORY:  very poor breath sounds of entire R side of lung .  ABDOMEN: Soft, non-tender,  non-distended MUSCULOSKELETAL:  No edema; No deformity  SKIN: Warm and dry NEUROLOGIC:  Alert and oriented x 3 PSYCHIATRIC:  Normal affect   ASSESSMENT:    1. Decreased breath sounds   2. Chronic heart failure with preserved ejection fraction ([place])   3. Chronic obstructive pulmonary disease, unspecified COPD type ([place])    PLAN:      Chronic Aspiration : I suspect this is her main issue with her chronic respiratory distress.    She chokes every times she eats and now has very poor breath sounds on the R side of her chest  CXR from 2 weeks ago looks ok on R side.     Will get a swallow evaluation    2. Chronic diastolic dysfunction:   hold lasix for now Check BMP today   Follow up with Dr. Gasper Sells in 4-6 weeks.      Medication Adjustments/Labs and Tests Ordered: Current medicines are reviewed at length with the patient today.  Concerns regarding medicines are outlined above.  Orders Placed This Encounter  Procedures   SLP modified barium swallow    No orders of the defined types were placed in this encounter.   Patient Instructions  Medication Instructions:  Your physician recommends that you continue on your current medications as directed. Please refer to the Current Medication list given to you today.  *If you need a refill on your cardiac medications before your next appointment, please call your pharmacy*   Lab Work: TODAY - BMET If you have labs (blood work) drawn today and your tests are completely normal, you will receive your results only by: [place] (if you have MyChart) OR A paper copy in the mail If you have any lab test that is abnormal or we need to change your treatment, we will call you to review the results.   Testing/Procedures: An outpatient barium swallow test has been ordered. You will receive a call to schedule.    Follow-Up: At [facility], you and your health needs are our priority.  As part of our continuing mission to  provide you with exceptional heart care, we have created designated Provider Care Teams.  These Care Teams include your primary Cardiologist (physician) and Advanced Practice Providers (APPs -  Physician Assistants and Nurse Practitioners) who all work together to provide you with the care you need, when you need it.   Your next appointment:   4 week(s)  The format for your next appointment:   In Person  Provider:   Rudean Haskell, MD    Signed, Mertie Moores, MD  12/11/2020 6:18 PM    [place]

## 2020-12-11 NOTE — Patient Instructions (Addendum)
Medication Instructions:  Your physician recommends that you continue on your current medications as directed. Please refer to the Current Medication list given to you today.  *If you need a refill on your cardiac medications before your next appointment, please call your pharmacy*   Lab Work: TODAY - BMET If you have labs (blood work) drawn today and your tests are completely normal, you will receive your results only by: [place] (if you have MyChart) OR A paper copy in the mail If you have any lab test that is abnormal or we need to change your treatment, we will call you to review the results.   Testing/Procedures: An outpatient barium swallow test has been ordered. You will receive a call to schedule.    Follow-Up: At [facility], you and your health needs are our priority.  As part of our continuing mission to provide you with exceptional heart care, we have created designated Provider Care Teams.  These Care Teams include your primary Cardiologist (physician) and Advanced Practice Providers (APPs -  Physician Assistants and Nurse Practitioners) who all work together to provide you with the care you need, when you need it.   Your next appointment:   4 week(s)  The format for your next appointment:   In Person  Provider:   Rudean Haskell, MD

## 2020-12-12 ENCOUNTER — Other Ambulatory Visit (HOSPITAL_COMMUNITY): Payer: Self-pay | Admitting: *Deleted

## 2020-12-12 DIAGNOSIS — I5032 Chronic diastolic (congestive) heart failure: Secondary | ICD-10-CM | POA: Diagnosis not present

## 2020-12-12 DIAGNOSIS — E43 Unspecified severe protein-calorie malnutrition: Secondary | ICD-10-CM | POA: Diagnosis not present

## 2020-12-12 DIAGNOSIS — R059 Cough, unspecified: Secondary | ICD-10-CM

## 2020-12-12 DIAGNOSIS — I152 Hypertension secondary to endocrine disorders: Secondary | ICD-10-CM | POA: Diagnosis not present

## 2020-12-12 DIAGNOSIS — E1159 Type 2 diabetes mellitus with other circulatory complications: Secondary | ICD-10-CM | POA: Diagnosis not present

## 2020-12-12 DIAGNOSIS — J449 Chronic obstructive pulmonary disease, unspecified: Secondary | ICD-10-CM | POA: Diagnosis not present

## 2020-12-12 DIAGNOSIS — R1310 Dysphagia, unspecified: Secondary | ICD-10-CM

## 2020-12-12 DIAGNOSIS — M353 Polymyalgia rheumatica: Secondary | ICD-10-CM | POA: Diagnosis not present

## 2020-12-16 DIAGNOSIS — I152 Hypertension secondary to endocrine disorders: Secondary | ICD-10-CM | POA: Diagnosis not present

## 2020-12-16 DIAGNOSIS — I5032 Chronic diastolic (congestive) heart failure: Secondary | ICD-10-CM | POA: Diagnosis not present

## 2020-12-16 DIAGNOSIS — E43 Unspecified severe protein-calorie malnutrition: Secondary | ICD-10-CM | POA: Diagnosis not present

## 2020-12-16 DIAGNOSIS — J449 Chronic obstructive pulmonary disease, unspecified: Secondary | ICD-10-CM | POA: Diagnosis not present

## 2020-12-16 DIAGNOSIS — E1159 Type 2 diabetes mellitus with other circulatory complications: Secondary | ICD-10-CM | POA: Diagnosis not present

## 2020-12-16 DIAGNOSIS — M353 Polymyalgia rheumatica: Secondary | ICD-10-CM | POA: Diagnosis not present

## 2020-12-18 ENCOUNTER — Ambulatory Visit (INDEPENDENT_AMBULATORY_CARE_PROVIDER_SITE_OTHER): Payer: Medicare Other | Admitting: Internal Medicine

## 2020-12-18 ENCOUNTER — Encounter: Payer: Self-pay | Admitting: Internal Medicine

## 2020-12-18 ENCOUNTER — Other Ambulatory Visit: Payer: Self-pay

## 2020-12-18 ENCOUNTER — Telehealth: Payer: Self-pay

## 2020-12-18 VITALS — BP 102/60 | HR 96 | Ht 59.0 in | Wt 106.4 lb

## 2020-12-18 DIAGNOSIS — E119 Type 2 diabetes mellitus without complications: Secondary | ICD-10-CM

## 2020-12-18 DIAGNOSIS — E559 Vitamin D deficiency, unspecified: Secondary | ICD-10-CM | POA: Diagnosis not present

## 2020-12-18 DIAGNOSIS — M818 Other osteoporosis without current pathological fracture: Secondary | ICD-10-CM

## 2020-12-18 DIAGNOSIS — E039 Hypothyroidism, unspecified: Secondary | ICD-10-CM | POA: Diagnosis not present

## 2020-12-18 DIAGNOSIS — I10 Essential (primary) hypertension: Secondary | ICD-10-CM | POA: Diagnosis not present

## 2020-12-18 LAB — POCT GLYCOSYLATED HEMOGLOBIN (HGB A1C): Hemoglobin A1C: 6.4 % — AB (ref 4.0–5.6)

## 2020-12-18 MED ORDER — INSULIN LISPRO (1 UNIT DIAL) 100 UNIT/ML (KWIKPEN): 2.0000 [IU] | PEN_INJECTOR | Freq: Every day | SUBCUTANEOUS | 11 refills | Status: DC

## 2020-12-18 NOTE — Patient Instructions (Addendum)
Please continue: - Lantus 5 units mid-day  Add: - Humalog 2-4 units before dinner but may need to also use this for brunch  Continue levothyroxine 50 mcg daily.  Take the thyroid hormone every day, with water, at least 30 minutes before breakfast.  Continue vitamin D 2000 units daily. You need a bone density test.  Check sugars 2x a day, rotating check times.  Please come back for a follow-up appointment in 4 months.

## 2020-12-18 NOTE — Telephone Encounter (Signed)
Prolia VOB initiated via [email or web address]  Last OV:  Next OV:  Last Prolia inj: 05/19/20 Next Prolia inj DUE: 11/17/20

## 2020-12-18 NOTE — Progress Notes (Signed)
Patient ID: Kayla Thompson, female   DOB: [birth year], 85 y.o.   MRN: [id number]  This visit occurred during the SARS-CoV-2 public health emergency.  Safety protocols were in place, including screening questions prior to the visit, additional usage of staff PPE, and extensive cleaning of exam room while observing appropriate contact time as indicated for disinfecting solutions.   HPI  Kayla Thompson is a 85 y.o.-year-old female, initially referred by her OB/GYN doctor, Dr. Uvaldo Rising, for evaluation for hyperparathyroidism + normo/hypo-calcemia, vitamin D deficiency,  and DM2, dx 2010, insulin-dependent since 2010, controlled, w/o long-term complications.  Last visit 6 months ago.  At today's visit, she is here with her caretaker.  She stays with her during the day.  The conversation was again carried over the phone with her daughter who is out of state.  She offered more information about medications diet, activity, and medication changes.  Interim history: No increased urination, blurry vision, nausea, chest pain. No falls or fractures. Sugars were higher on Lasix for ~10 days. Sugars are still high.  Reviewed history: Pt was dx with hyperparathyroidism in 08/2014.  Reviewed pertinent labs: Lab Results  Component Value Date   PTH 37 06/09/2015   PTH Comment 06/09/2015   PTH 73 (H) 10/28/2014   PTH Comment 10/28/2014   PTH 190 (H) 09/10/2014   PTH 243 (H) 08/23/2014   CALCIUM 9.6 11/26/2020   CALCIUM 9.5 11/17/2020   CALCIUM 8.7 (L) 08/04/2020   CALCIUM 8.1 (L) 08/03/2020   CALCIUM 8.1 (L) 08/02/2020   CALCIUM 8.6 (L) 08/01/2020   CALCIUM 8.9 07/31/2020   CALCIUM 9.2 07/30/2020   CALCIUM 8.8 07/15/2020   CALCIUM 8.9 07/09/2020   Her osteoporosis was diagnosed in 2007.  She had few falls.  Reviewed previous DXA scan report: Date L1-L2 (L3 and L4 excluded) T score FN T score  01/05/2018 [facility])  +0.4  LFN: -2.8  07/25/2014   -0.1  LFN: -2.7  Right hip could not be  analyzed due to previous instrumentation.  She has a history of multiple fractures in: - 09/23/1990: pelvic fx - 02/2005: R hip fx - rod - 05/2006: rib fx - 12/2008: 3 R ankle fx - in [place] - plate and screws - 20/2542: wrist fx's 01/2012: steroid inj's R knee - 07/2014: forearm fx and nasal bone fx - 09/2019: Chest x-ray: Age-indeterminate fractures of the third and fourth ribs  She started Prolia in 2017.   Per PCP. She is tolerating this well.   No h/o kidney stones.  No CKD. Last BUN/Cr: Lab Results  Component Value Date   BUN 30 (H) 11/26/2020   CREATININE 1.12 (H) 11/26/2020   She is not on HCTZ.  Patient continues on Caltrate-vit D 600 mg 2x a day, multivitamin, and vitamin D 2000 units daily.  No FH of hypercalcemia, pituitary tumors, thyroid cancer, or osteoporosis.   Vitamin D level:   Lab Results  Component Value Date   VD25OH 89.5 06/12/2020   VD25OH 101.47 (HH) 02/19/2020   VD25OH 99.3 12/14/2019   VD25OH 72.23 10/12/2018   VD25OH 81.19 06/09/2015   VD25OH 65.60 01/28/2015   VD25OH 48 08/23/2014  - we decreased the dose of vitamin D from 4000 to 2000 units in 11/2019  A 24-hour urine calcium was normal: Component     Latest Ref Rng 03/31/2015  Calcium, Ur     Not estab mg/dL 9  Calcium, 24 hour urine     35 - 250 mg/24 h  108  Creatinine, Urine     20 - 320 mg/dL 78  Creatinine, 24H Ur     0.63 - 2.50 g/24 h 0.94   DM2: - dx 2010 >> she started insulin and diagnosis.  Reviewed HbA1c levels: Lab Results  Component Value Date   HGBA1C 5.6 07/07/2020   HGBA1C 5.9 (H) 05/08/2020   HGBA1C 6.6 (H) 01/13/2020   She is on: - Lantus 30 >> 25 >> 20 >> 15 >> 11 >> 8 >> 5  units midday Previously on: Humalog 4-6 >> Novolog 4 units before breakfast only >> stopped 09/2018  She checks sugars once a day: - am: 91-142 >> 84-105, 142 >> 48, 52, 63-136, 169 >> 74, 112-187, 218 - after lunch: 120 >> 116-154 >> n/c  - after dinner: 132 >> 70 >> n/c -  bedtime: 60 >> n/c >> <160 >> 84-190s >> 89-256 >> 174, 193-337 Lowest: 91 >> 84 >> 74. Highest: 194 >> 190s >> 337.  -+ HL; latest lipids:  Lab Results  Component Value Date   CHOL 132 10/16/2019   HDL 55 10/16/2019   LDLCALC 57 10/16/2019   TRIG 114 10/16/2019   CHOLHDL 2.4 10/16/2019  On Crestor 10.  + Mild CKD;. Latest BUN/Cr: Lab Results  Component Value Date   BUN 30 (H) 11/26/2020   Lab Results  Component Value Date   CREATININE 1.12 (H) 11/26/2020   Last dilated eye exam: 11/13/2020: No DR; Dr Gershon Crane.  She has a history of chalazion, has a history of cataract surgery.  She also has a history of Ovarian cancer - 1996, urinary incontinence, HTN.  She has a history of hypothyroidism: Lab Results  Component Value Date   TSH 0.681 05/07/2020   TSH 0.77 02/19/2020   TSH 0.76 10/16/2019   TSH 0.110 (L) 01/27/2019   TSH 0.85 10/12/2018   TSH 0.23 (L) 12/01/2016   TSH 0.61 07/15/2016   TSH 0.61 10/21/2015   TSH 0.36 03/03/2015   TSH 0.591 08/02/2014   She is very reticent to decrease the dose of levothyroxine due to fear of hair loss.  At last visit she was on levothyroxine 50 mcg daily.  In 01/2019, her TSH was suppressed and I again advised her to decrease the dose to 25 g daily.  She did not do so.  Pt takes the levothyroxine: - in am - fasting - at least 30 min from b'fast (moved from 10 min later - at last visit) - + Ca, Fe, MVI, PPIs along with levothyroxine - not on Biotin  Pt denies: - feeling nodules in neck - hoarseness - choking - SOB with lying down But does have chronic dysphagia  ROS:  Cardiovascular: no CP/no SOB/no palpitations/+ leg swelling Gastrointestinal: no N/no V/no D/no C/+ acid reflux Musculoskeletal: + muscle aches/+ joint aches (shoulders) Neurological: + tremors/no numbness/no tingling/no dizziness  I reviewed pt's medications, allergies, PMH, social hx, family hx, and changes were documented in the history of present  illness. Otherwise, unchanged from my initial visit note.  Past Medical History:  Diagnosis Date   Anxiety    Arthritis    Asthma    Cataracts, bilateral    removed by surgery   COPD (chronic obstructive pulmonary disease) ([place])    Depression    Diabetes mellitus without complication ([place])    type 2   Dyspnea    with exertion   Fibromyalgia    GERD (gastroesophageal reflux disease)    History of chemotherapy  History of fractured pelvis    Hypertension    Hypothyroidism    Osteoporosis 12/2017   T score -2.8 stable from prior DEXA   Osteoporosis    Ovarian cancer ([place])    Pneumonia    several times   Past Surgical History:  Procedure Laterality Date   ABDOMINAL HYSTERECTOMY  1996   ANKLE FRACTURE SURGERY     plate and screws   BIOPSY  02/05/2019   Procedure: BIOPSY;  Surgeon: Irving Copas., MD;  Location: [facility] ENDOSCOPY;  Service: Gastroenterology;;   BLADDER SUSPENSION     CATARACT EXTRACTION     COLONOSCOPY     ESOPHAGEAL BRUSHING  02/05/2019   Procedure: ESOPHAGEAL BRUSHING;  Surgeon: Irving Copas., MD;  Location: [facility] ENDOSCOPY;  Service: Gastroenterology;;   ESOPHAGOGASTRODUODENOSCOPY (EGD) WITH PROPOFOL N/A 02/05/2019   Procedure: ESOPHAGOGASTRODUODENOSCOPY (EGD) WITH PROPOFOL with dialtion;  Surgeon: Irving Copas., MD;  Location: [place];  Service: Gastroenterology;  Laterality: N/A;   EYE SURGERY     cataracts removed-bilateral   hip surgey     knee surgey     LUNG SURGERY     OOPHORECTOMY     BSO   SAVORY DILATION N/A 02/05/2019   Procedure: SAVORY DILATION;  Surgeon: Irving Copas., MD;  Location: [facility];  Service: Gastroenterology;  Laterality: N/A;   TONSILLECTOMY     UPPER GI ENDOSCOPY     History   Social History   Marital Status: Widowed    Spouse Name: N/A   Number of Children: 2 (1 adopted)   Occupational History   retired   Social History Main Topics   Smoking status: Never Smoker     Smokeless tobacco: Never Used   Alcohol Use: No   Drug Use: No   Current Outpatient Medications on File Prior to Visit  Medication Sig Dispense Refill   acetaminophen (TYLENOL) 650 MG CR tablet Take 1,300 mg by mouth every 4 (four) hours.     albuterol (PROAIR HFA) 108 (90 Base) MCG/ACT inhaler Inhale 2 puffs into the lungs every 6 (six) hours as needed for wheezing or shortness of breath. 18 g 12   amitriptyline (ELAVIL) 10 MG tablet TAKE 1 TABLET BY MOUTH EVERYDAY AT BEDTIME 90 tablet 3   Apoaequorin (PREVAGEN PO) Take 1 tablet by mouth daily.     Ascorbic Acid (VITAMIN C) 1000 MG tablet Take 1,000 mg by mouth 2 (two) times daily.     baclofen (LIORESAL) 10 MG tablet TAKE 0.5-1 TABLET BY MOUTH TWICE DAILY AS NEEDED FOR MUSCLE SPASMS OR PAIN 180 tablet 1   BD PEN NEEDLE NANO 2ND GEN 32G X 4 MM MISC USE AS DIRECTED 4 TIMES A DAY 100 each 5   busPIRone (BUSPAR) 5 MG tablet Take 5 mg by mouth as needed (take 1-2 tabs as needed).     calcium carbonate (OSCAL) 1500 (600 Ca) MG TABS tablet Take 600 mg of elemental calcium by mouth 2 (two) times daily with a meal.     cholecalciferol (VITAMIN D3) 25 MCG (1000 UNIT) tablet Take 1,000 Units by mouth daily.     clotrimazole-betamethasone (LOTRISONE) cream APPLY TO AFFECTED AREA TWICE A DAY 45 g 6   Cyanocobalamin (VITAMIN B 12 PO) Take 2,500 mg by mouth once a week.     denosumab (PROLIA) 60 MG/ML SOSY injection Inject 60 mg into the skin every 6 (six) months.     docusate sodium (COLACE) 100 MG capsule Take 100 mg by mouth  as needed for mild constipation.     DULoxetine (CYMBALTA) 30 MG capsule Take 1 capsule (30 mg total) by mouth daily. 90 capsule 3   DULoxetine (CYMBALTA) 60 MG capsule Take 1 capsule (60 mg total) by mouth daily. 90 capsule 3   estradiol (ESTRACE) 0.1 MG/GM vaginal cream Place 1 Applicatorful vaginally See admin instructions. Takes 2 times a week     feeding supplement (ENSURE ENLIVE / ENSURE PLUS) LIQD Take 237 mLs by mouth 2  (two) times daily between meals. 237 mL 12   fluconazole (DIFLUCAN) 100 MG tablet Take 100 mg by mouth daily. For 7 days (Patient not taking: Reported on 12/11/2020)     furosemide (LASIX) 20 MG tablet Take 1 tablet (20 mg total) by mouth daily. 90 tablet 3   gabapentin (NEURONTIN) 100 MG capsule Take 100-200 mg by mouth at bedtime.     glucose blood (FREESTYLE LITE) test strip USE TO TEST BLOOD SUGAR 3 TIMES DAILY. 100 strip 5   hydrocortisone 2.5 % ointment Apply topically 2 (two) times daily.     insulin glargine (LANTUS) 100 UNIT/ML Solostar Pen Inject 5 Units into the skin every morning.     ipratropium-albuterol (DUONEB) 0.5-2.5 (3) MG/3ML SOLN USE 1 VIAL IN NEBULIZER 3 TIMES DAILY 90 mL 11   Lancets (FREESTYLE) lancets Use to test blood sugar 3 times daily. Dx: E11.65 100 each 3   levothyroxine (SYNTHROID) 50 MCG tablet TAKE 1 TABLET BY MOUTH EVERY DAY BEFORE BREAKFAST 90 tablet 1   lidocaine (XYLOCAINE) 5 % ointment Apply 1 application topically as needed. Apply to external vulvar area 50 g 2   Lidocaine 4 % PTCH Apply 1 patch topically 2 (two) times daily as needed (pain).     magnesium oxide (MAG-OX) 400 MG tablet TAKE 1 TABLET BY MOUTH EVERY DAY 90 tablet 0   melatonin 1 MG TABS tablet Take 1 mg by mouth at bedtime as needed.     methenamine (MANDELAMINE) 1 g tablet Take 1,000 mg by mouth 2 (two) times daily.     Misc Natural Products (TART CHERRY ADVANCED PO) Take 1 tablet by mouth daily.     montelukast (SINGULAIR) 10 MG tablet TAKE 1 TABLET BY MOUTH EVERY DAY 90 tablet 2   Multiple Vitamin (MULTIVITAMIN WITH MINERALS) TABS tablet Take 1 tablet by mouth daily. Centrum     naloxone (NARCAN) nasal spray 4 mg/0.1 mL Place 1 spray into the nose once as needed (overdose).     nitrofurantoin, macrocrystal-monohydrate, (MACROBID) 100 MG capsule Take 100 mg by mouth daily.     omeprazole (PRILOSEC) 20 MG capsule TAKE 1 CAPSULE TWICE A DAY BEFORE A MEAL 180 capsule 1   oxyCODONE-acetaminophen  (PERCOCET) 7.5-325 MG tablet Take 1 tablet by mouth 5 (five) times daily as needed.     polyethylene glycol (MIRALAX / GLYCOLAX) 17 g packet Take 17 g by mouth daily.     predniSONE (DELTASONE) 1 MG tablet Take 2 tablets (2 mg total) by mouth daily. 60 tablet 1   Propylene Glycol (SYSTANE COMPLETE OP) Apply 1-2 drops to eye as needed.     Pseudoephedrine-guaiFENesin (MUCINEX D MAX STRENGTH PO) Take 1,200 mg by mouth in the morning and at bedtime.     rosuvastatin (CRESTOR) 10 MG tablet Take 1 tablet (10 mg total) by mouth every evening. 90 tablet 3   TRELEGY ELLIPTA 100-62.5-25 MCG/INH AEPB INHALE 1 PUFF INTO THE LUNGS DAILY. RINSE MOUTH 60 each 11   TURMERIC PO Take  1 tablet by mouth daily. Take 1 tab (1000 mg) daily     No current facility-administered medications on file prior to visit.   Allergies  Allergen Reactions   Latex Rash   Penicillins Rash    Has patient had a PCN reaction causing immediate rash, facial/tongue/throat swelling, SOB or lightheadedness with hypotension: No Has patient had a PCN reaction causing severe rash involving mucus membranes or skin necrosis: No Has patient had a PCN reaction that required hospitalization: No Has patient had a PCN reaction occurring within the last 10 years: No If all of the above answers are "NO", then may proceed with Cephalosporin use.    Family History  Problem Relation Age of Onset   Lung cancer Mother    CAD Father    Diabetes Father    Lung cancer Maternal Aunt    Diabetes Paternal Aunt    Diabetes Paternal Uncle    Diabetes Paternal Grandmother    Diabetes Paternal Grandfather    Colon cancer Neg Hx    Esophageal cancer Neg Hx    Inflammatory bowel disease Neg Hx    Liver disease Neg Hx    Pancreatic cancer Neg Hx    Rectal cancer Neg Hx    Stomach cancer Neg Hx    PE: BP 102/60 (BP Location: Right Arm, Patient Position: Sitting, Cuff Size: Normal)   Ht 4\' 11"  (1.499 m)   Wt 106 lb 6.4 oz (48.3 kg)   LMP  (LMP  Unknown)   BMI 21.49 kg/m  Body mass index is 21.49 kg/m. Wt Readings from Last 3 Encounters:  12/18/20 106 lb 6.4 oz (48.3 kg)  11/20/20 105 lb (47.6 kg)  07/30/20 116 lb 13.5 oz (53 kg)   Constitutional: normal weight, in NAD Eyes: PERRLA, EOMI, no exophthalmos ENT: moist mucous membranes, no thyromegaly, no cervical lymphadenopathy Cardiovascular: tachycardia, RR, No MRG, + bilateral lower extremity edema Respiratory: CTA B Gastrointestinal: abdomen soft, NT, ND, BS+ Musculoskeletal: + finger deformities ([place] and Bouchard nodules), strength intact in all 4 Skin: moist, warm, no rashes Neurological: + tremor with outstretched hands, DTR normal in all 4  Assessment: 1. H/o Hyperparathyroidism - recent normocalcemia  2. Vitamin D def  3. DM2, insulin-dependent, controlled, without complications  4. Low TSH  5.  Osteoporosis  Plan: 1. HPTH and 2. Vit D def -Patient with history of hyperparathyroidism in the setting of low calcium, with PTH levels normalizing after correction of calcium levels.   She continues on Prolia injections, calcium, and vitamin D. -Vitamin D level was elevated in 01/2020, at 1 03/22/1943 01/2020 and at this point, vitamin D was elevated, at 101.47. -A repeat vitamin D level at last visit was 89.5 (05/2020)  3. DM2 - Patient with longstanding, previously uncontrolled type 2 diabetes, on basal insulin only.  We were able to stop the NovoLog as her sugars improved and HbA1c decreased.  At last visit, she was having lows in the 40s and 50s and sugars were higher at night, with some values in the 200s.  We discussed about decreasing the dose of Lantus further, to 8 units daily.  Since then, they contacted me with persistent low blood sugars in the 40s and 50s and we decreased her Lantus further to 5 units daily in 10/2020.  Daughter contacted me with high blood sugars after dinner on 12/01/2020.  At that time, we discussed about possibly adding NovoLog before  this meal, however, they revealed that patient was drinking juice around that  time and they wanted to stop juice first and see if the sugars persist elevated before adding mealtime insulin. -At today's visit, sugars are close to goal in the morning but they are very high at night.  It is unclear whether this increase is due to her brunch or her dinner.  As of now, we discussed that we will need to start rapid acting insulin with dinner and if the sugars are not improved, we may need to also start this with her brunch.  I did advise her to check blood sugars before dinner, also, since now they are taking only in the morning and at bedtime.  For now we can continue the same dose of Lantus. - I advised her to  Patient Instructions  Please continue: - Lantus 5 units mid-day  Add: - Humalog 2-4 units before dinner but may need to also use this for brunch  Continue levothyroxine 50 mcg daily.  Take the thyroid hormone every day, with water, at least 30 minutes before breakfast.  Continue vitamin D 2000 units daily.  You need a bone density test.  Check sugars 2x a day, rotating check times.  Please come back for a follow-up appointment in 4 months.  - we checked her HbA1c: 6.4% (higher) - advised to check sugars at different times of the day - 2x a day, rotating check times - advised for yearly eye exams >> she is UTD - return to clinic in 4 months  4.  Hypothyroidism - latest thyroid labs reviewed with pt. >> normal: Lab Results  Component Value Date   TSH 0.681 05/07/2020  - she continues on LT4 50 mcg daily - in the past she was reticent to decrease the dose of levothyroxine due to fear of hair loss, despite a discussion about bone and cardiovascular risks of thyrotoxicosis. - She is not taking it correctly, taking it along with multivitamins, PPIs, and calcium but we did not move this later for fear of thyrotoxicosis.  On the above regimen, her TSH was close to the lower limit of  normal, as seen in 04/2020. - pt feels good on this dose. - we discussed about taking the thyroid hormone every day, with water, >30 minutes before breakfast - will check thyroid tests at next visit  5.  Osteoporosis -Reviewed her bone density results from 12/2017: Left femoral neck T score was -2.8, only slightly lower compared to 2016 when it was -2.6.  The right femoral neck could not be analyzed due to hardware -She continues on Prolia in PCPs office-tolerated well -In 2021 she had a chest x-ray which showed age indeterminate rib fractures of the third and fourth ribs.  Also, a left hip x-ray showed chronic fracture deformities in the bilateral superior and inferior pubic rami (these were previously known). -She is due for another bone density scan -per PCP  Philemon Kingdom, MD PhD [facility] Endocrinology

## 2020-12-23 ENCOUNTER — Ambulatory Visit (HOSPITAL_COMMUNITY)
Admission: RE | Admit: 2020-12-23 | Discharge: 2020-12-23 | Disposition: A | Discharge status: Home / Self Care | Payer: Medicare Other | Source: Ambulatory Visit | Attending: Cardiovascular Disease | Admitting: Cardiovascular Disease

## 2020-12-23 ENCOUNTER — Other Ambulatory Visit: Payer: Self-pay

## 2020-12-23 DIAGNOSIS — J449 Chronic obstructive pulmonary disease, unspecified: Secondary | ICD-10-CM | POA: Insufficient documentation

## 2020-12-23 DIAGNOSIS — R059 Cough, unspecified: Secondary | ICD-10-CM | POA: Diagnosis not present

## 2020-12-23 DIAGNOSIS — R1310 Dysphagia, unspecified: Secondary | ICD-10-CM | POA: Insufficient documentation

## 2020-12-23 DIAGNOSIS — I5032 Chronic diastolic (congestive) heart failure: Secondary | ICD-10-CM | POA: Insufficient documentation

## 2020-12-23 DIAGNOSIS — R0689 Other abnormalities of breathing: Secondary | ICD-10-CM | POA: Diagnosis not present

## 2020-12-24 DIAGNOSIS — J449 Chronic obstructive pulmonary disease, unspecified: Secondary | ICD-10-CM | POA: Diagnosis not present

## 2020-12-24 DIAGNOSIS — E43 Unspecified severe protein-calorie malnutrition: Secondary | ICD-10-CM | POA: Diagnosis not present

## 2020-12-24 DIAGNOSIS — E1159 Type 2 diabetes mellitus with other circulatory complications: Secondary | ICD-10-CM | POA: Diagnosis not present

## 2020-12-24 DIAGNOSIS — M353 Polymyalgia rheumatica: Secondary | ICD-10-CM | POA: Diagnosis not present

## 2020-12-24 DIAGNOSIS — I5032 Chronic diastolic (congestive) heart failure: Secondary | ICD-10-CM | POA: Diagnosis not present

## 2020-12-24 DIAGNOSIS — I152 Hypertension secondary to endocrine disorders: Secondary | ICD-10-CM | POA: Diagnosis not present

## 2020-12-25 NOTE — Telephone Encounter (Signed)
Pt ready for scheduling on or after 11/17/20  Out-of-pocket cost due at time of visit: $280  Primary: Medicare Prolia co-insurance: 20% (approximately $255) Admin fee co-insurance: 20% (approximately $25)  Secondary: AARP Prolia co-insurance: Covers Medicare Part B co-insurance and deductible.  Admin fee co-insurance: Covers Medicare Part B co-insurance and deductible.   Deductible: $233 of $233 met  Prior Auth: not required PA# Valid:   ** This summary of benefits is an estimation of the patient's out-of-pocket cost. Exact cost may vary based on individual plan coverage.

## 2020-12-30 DIAGNOSIS — N183 Chronic kidney disease, stage 3 unspecified: Secondary | ICD-10-CM | POA: Diagnosis not present

## 2020-12-30 DIAGNOSIS — Z0000 Encounter for general adult medical examination without abnormal findings: Secondary | ICD-10-CM | POA: Diagnosis not present

## 2020-12-30 DIAGNOSIS — M545 Low back pain, unspecified: Secondary | ICD-10-CM | POA: Diagnosis not present

## 2020-12-30 DIAGNOSIS — Z79899 Other long term (current) drug therapy: Secondary | ICD-10-CM | POA: Diagnosis not present

## 2020-12-31 DIAGNOSIS — M353 Polymyalgia rheumatica: Secondary | ICD-10-CM | POA: Diagnosis not present

## 2020-12-31 DIAGNOSIS — E1159 Type 2 diabetes mellitus with other circulatory complications: Secondary | ICD-10-CM | POA: Diagnosis not present

## 2020-12-31 DIAGNOSIS — E43 Unspecified severe protein-calorie malnutrition: Secondary | ICD-10-CM | POA: Diagnosis not present

## 2020-12-31 DIAGNOSIS — I5032 Chronic diastolic (congestive) heart failure: Secondary | ICD-10-CM | POA: Diagnosis not present

## 2020-12-31 DIAGNOSIS — I152 Hypertension secondary to endocrine disorders: Secondary | ICD-10-CM | POA: Diagnosis not present

## 2020-12-31 DIAGNOSIS — J449 Chronic obstructive pulmonary disease, unspecified: Secondary | ICD-10-CM | POA: Diagnosis not present

## 2021-01-06 DIAGNOSIS — E1159 Type 2 diabetes mellitus with other circulatory complications: Secondary | ICD-10-CM | POA: Diagnosis not present

## 2021-01-06 DIAGNOSIS — I5032 Chronic diastolic (congestive) heart failure: Secondary | ICD-10-CM | POA: Diagnosis not present

## 2021-01-06 DIAGNOSIS — M353 Polymyalgia rheumatica: Secondary | ICD-10-CM | POA: Diagnosis not present

## 2021-01-06 DIAGNOSIS — J449 Chronic obstructive pulmonary disease, unspecified: Secondary | ICD-10-CM | POA: Diagnosis not present

## 2021-01-06 DIAGNOSIS — E43 Unspecified severe protein-calorie malnutrition: Secondary | ICD-10-CM | POA: Diagnosis not present

## 2021-01-06 DIAGNOSIS — I152 Hypertension secondary to endocrine disorders: Secondary | ICD-10-CM | POA: Diagnosis not present

## 2021-01-06 NOTE — Progress Notes (Deleted)
Cardiology Office Note:    Date:  01/06/2021   ID:  Kayla Thompson, DOB 1935/09/05, MRN [id number]  PCP:  [name], MD   Referring MD: [name], *   CC: Follow up aspiration PNA  History of Present Illness:    Kayla Thompson is a 85 y.o. female with a hx of HTN, HFpEF, Chromic Asthma with Granuloamtous Disease and COPD, Type II Diabetes Mellitus, Dysuria, who presents for for leg swelling 12/21/19.  Had Echo with Grade 1 diastolic dysfunction, Had arterial duplex with mild PAD but biphasic flow (elevation in TBI).  In interim had hospitalization for Thrush and sepsis from UTI.  Has had lasix and Coreg stopped in the setting of low BP.  Last seen 01/2020  In interim of this visit, patient had UTI admission 08/04/20.  Recently has 8/29/ ED evaluation for SOB and Leg Swelling. Seen 11/20/20.  In interim of this visit, patient was more altered per family on lasix 40 mg PO daily; had DOD visit with suspicion for chronic aspiration; confirmed by 12/23/20 swallow study.  Patient notes that she is doing ***.    No chest pain or pressure ***.  No SOB/DOE*** and no PND/Orthopnea***.  No weight gain or leg swelling***.  No palpitations or syncope ***.  Ambulatory blood pressure ***.    Past Medical History:  Diagnosis Date   Anxiety    Arthritis    Asthma    Cataracts, bilateral    removed by surgery   COPD (chronic obstructive pulmonary disease) ([place])    Depression    Diabetes mellitus without complication (HCC)    type 2   Dyspnea    with exertion   Fibromyalgia    GERD (gastroesophageal reflux disease)    History of chemotherapy    History of fractured pelvis    Hypertension    Hypothyroidism    Osteoporosis 12/2017   T score -2.8 stable from prior DEXA   Osteoporosis    Ovarian cancer ([place])    Pneumonia    several times    Past Surgical History:  Procedure Laterality Date   ABDOMINAL HYSTERECTOMY  1996   ANKLE FRACTURE SURGERY     plate and  screws   BIOPSY  02/05/2019   Procedure: BIOPSY;  Surgeon: Irving Copas., MD;  Location: [facility] ENDOSCOPY;  Service: Gastroenterology;;   BLADDER SUSPENSION     CATARACT EXTRACTION     COLONOSCOPY     ESOPHAGEAL BRUSHING  02/05/2019   Procedure: ESOPHAGEAL BRUSHING;  Surgeon: Irving Copas., MD;  Location: [facility] ENDOSCOPY;  Service: Gastroenterology;;   ESOPHAGOGASTRODUODENOSCOPY (EGD) WITH PROPOFOL N/A 02/05/2019   Procedure: ESOPHAGOGASTRODUODENOSCOPY (EGD) WITH PROPOFOL with dialtion;  Surgeon: Irving Copas., MD;  Location: [place];  Service: Gastroenterology;  Laterality: N/A;   EYE SURGERY     cataracts removed-bilateral   hip surgey     knee surgey     LUNG SURGERY     OOPHORECTOMY     BSO   SAVORY DILATION N/A 02/05/2019   Procedure: SAVORY DILATION;  Surgeon: Irving Copas., MD;  Location: [place];  Service: Gastroenterology;  Laterality: N/A;   TONSILLECTOMY     UPPER GI ENDOSCOPY     Current Medications: No outpatient medications have been marked as taking for the 01/07/21 encounter (Appointment) with Werner Lean, MD.    Allergies:   Latex and Penicillins   Social History   Socioeconomic History   Marital status: Widowed  Spouse name: Not on file   Number of children: 2   Years of education: Not on file   Highest education level: Not on file  Occupational History   Not on file  Tobacco Use   Smoking status: Never   Smokeless tobacco: Never  Vaping Use   Vaping Use: Never used  Substance and Sexual Activity   Alcohol use: No    Alcohol/week: 0.0 standard drinks   Drug use: No   Sexual activity: Not Currently    Birth control/protection: Post-menopausal    Comment: declined sexual Hx questions  Other Topics Concern   Not on file  Social History Narrative   Not on file   Social Determinants of Health   Financial Resource Strain: Low Risk    Difficulty of Paying Living Expenses: Not hard at all   Food Insecurity: No Food Insecurity   Worried About Charity fundraiser in the Last Year: Never true   [place] in the Last Year: Never true  Transportation Needs: No Transportation Needs   Lack of Transportation (Medical): No   Lack of Transportation (Non-Medical): No  Physical Activity: Insufficiently Active   Days of Exercise per Week: 2 days   Minutes of Exercise per Session: 30 min  Stress: No Stress Concern Present   Feeling of Stress : Not at all  Social Connections: Not on file    Family History: The patient's family history includes CAD in her father; Diabetes in her father, paternal aunt, paternal grandfather, paternal grandmother, and paternal uncle; Lung cancer in her maternal aunt and mother. There is no history of Colon cancer, Esophageal cancer, Inflammatory bowel disease, Liver disease, Pancreatic cancer, Rectal cancer, or Stomach cancer.  ROS:   Please see the history of present illness.    Notes constipation All other systems reviewed and are negative.  EKGs/Labs/Other Studies Reviewed:    The following studies were reviewed today:  EKG:  11/20/20: Sinus Tachycardia rate 101 No ST T changes (diffusely flat) 10/16/19 SR, 79, No ST/T changes  Transthoracic Echocardiogram: Date:01/08/2020 Results:  1. Left ventricular ejection fraction, by estimation, is 55 to 60%. The  left ventricle has normal function. The left ventricle has no regional  wall motion abnormalities. Left ventricular diastolic parameters are  consistent with Grade I diastolic  dysfunction (impaired relaxation).   2. Right ventricular systolic function is normal. The right ventricular  size is normal.   3. The mitral valve is normal in structure. No evidence of mitral valve  regurgitation. No evidence of mitral stenosis. Moderate mitral annular  calcification.   4. The aortic valve is calcified. There is moderate calcification of the  aortic valve. There is moderate thickening of the  aortic valve. Aortic  valve regurgitation is not visualized. Mild aortic valve stenosis. Aortic  valve area, by VTI measures 1.39  cm. Aortic valve mean gradient measures 10.0 mmHg. Aortic valve Vmax  measures 2.00 m/s.   5. The inferior vena cava is normal in size with greater than 50%  respiratory variability, suggesting right atrial pressure of 3 mmHg.  Recent Labs: 05/07/2020: TSH 0.681 11/17/2020: ALT 16; B Natriuretic Peptide 87.9; Hemoglobin 9.5; Platelets 351 11/26/2020: BUN 30; Creatinine, Ser 1.12; Magnesium 2.0; NT-Pro BNP 291; Potassium 4.6; Sodium 140    Recent Lipid Panel    Component Value Date/Time   CHOL 132 10/16/2019 1506   TRIG 114 10/16/2019 1506   HDL 55 10/16/2019 1506   CHOLHDL 2.4 10/16/2019 1506   VLDL  26.0 10/12/2018 1517   LDLCALC 57 10/16/2019 1506    Physical Exam:    VS:   There were no vitals filed for this visit.   There is no height or weight on file to calculate BMI.  Wt Readings from Last 3 Encounters:  12/18/20 106 lb 6.4 oz (48.3 kg)  11/20/20 105 lb (47.6 kg)  07/30/20 116 lb 13.5 oz (53 kg)    GEN: Elderly female  in no acute distress HEENT: Normal NECK: No JVD; LYMPHATICS: No lymphadenopathy CARDIAC: Regular tachycardia no rubs, gallops RESPIRATORY:  Decreased breath sound in right sided lung fields ABDOMEN: Soft, non-tender, non-distended MUSCULOSKELETAL: 1+  edema bilaterally ulceration now gone  SKIN: Warm and dry NEUROLOGIC:  Alert and oriented x 3 PSYCHIATRIC:  Normal affect   ASSESSMENT:    No diagnosis found.  PLAN:    In order of problems listed above:  HFpEF COPD PAD (by TBI's) Diabetes with HTN Mild cognitive impairment with chronic aspiration - Restarted lasix 20 mg PO daily - compression stockings  Will plan for *** follow up unless new symptoms or abnormal test results warranting change in plan  Would be reasonable for *** APP Follow up   Medication Adjustments/Labs and Tests Ordered: Current  medicines are reviewed at length with the patient today.  Concerns regarding medicines are outlined above.  No orders of the defined types were placed in this encounter.   No orders of the defined types were placed in this encounter.    There are no Patient Instructions on file for this visit.   Signed, Werner Lean, MD  01/06/2021 2:51 PM    [facility]

## 2021-01-07 ENCOUNTER — Inpatient Hospital Stay (HOSPITAL_COMMUNITY)
Admission: EM | Admit: 2021-01-07 | Discharge: 2021-01-12 | DRG: 871 | Disposition: A | Discharge status: Home Health | Payer: Medicare Other | Attending: Internal Medicine | Admitting: Internal Medicine

## 2021-01-07 ENCOUNTER — Encounter (HOSPITAL_COMMUNITY): Payer: Self-pay

## 2021-01-07 ENCOUNTER — Other Ambulatory Visit: Payer: Self-pay

## 2021-01-07 ENCOUNTER — Ambulatory Visit: Payer: Medicare Other | Admitting: Internal Medicine

## 2021-01-07 DIAGNOSIS — E1169 Type 2 diabetes mellitus with other specified complication: Secondary | ICD-10-CM | POA: Diagnosis not present

## 2021-01-07 DIAGNOSIS — Z833 Family history of diabetes mellitus: Secondary | ICD-10-CM

## 2021-01-07 DIAGNOSIS — R6520 Severe sepsis without septic shock: Secondary | ICD-10-CM | POA: Diagnosis present

## 2021-01-07 DIAGNOSIS — Z7951 Long term (current) use of inhaled steroids: Secondary | ICD-10-CM

## 2021-01-07 DIAGNOSIS — Z8543 Personal history of malignant neoplasm of ovary: Secondary | ICD-10-CM

## 2021-01-07 DIAGNOSIS — I5032 Chronic diastolic (congestive) heart failure: Secondary | ICD-10-CM | POA: Diagnosis not present

## 2021-01-07 DIAGNOSIS — R532 Functional quadriplegia: Secondary | ICD-10-CM | POA: Diagnosis not present

## 2021-01-07 DIAGNOSIS — I152 Hypertension secondary to endocrine disorders: Secondary | ICD-10-CM | POA: Diagnosis not present

## 2021-01-07 DIAGNOSIS — J8410 Pulmonary fibrosis, unspecified: Secondary | ICD-10-CM | POA: Diagnosis not present

## 2021-01-07 DIAGNOSIS — Z9221 Personal history of antineoplastic chemotherapy: Secondary | ICD-10-CM

## 2021-01-07 DIAGNOSIS — M353 Polymyalgia rheumatica: Secondary | ICD-10-CM | POA: Diagnosis present

## 2021-01-07 DIAGNOSIS — I499 Cardiac arrhythmia, unspecified: Secondary | ICD-10-CM | POA: Diagnosis not present

## 2021-01-07 DIAGNOSIS — Z66 Do not resuscitate: Secondary | ICD-10-CM | POA: Diagnosis not present

## 2021-01-07 DIAGNOSIS — K219 Gastro-esophageal reflux disease without esophagitis: Secondary | ICD-10-CM | POA: Diagnosis present

## 2021-01-07 DIAGNOSIS — G9341 Metabolic encephalopathy: Secondary | ICD-10-CM | POA: Diagnosis not present

## 2021-01-07 DIAGNOSIS — M150 Primary generalized (osteo)arthritis: Secondary | ICD-10-CM | POA: Diagnosis not present

## 2021-01-07 DIAGNOSIS — E213 Hyperparathyroidism, unspecified: Secondary | ICD-10-CM | POA: Diagnosis not present

## 2021-01-07 DIAGNOSIS — G8929 Other chronic pain: Secondary | ICD-10-CM | POA: Diagnosis not present

## 2021-01-07 DIAGNOSIS — M810 Age-related osteoporosis without current pathological fracture: Secondary | ICD-10-CM | POA: Diagnosis present

## 2021-01-07 DIAGNOSIS — Z794 Long term (current) use of insulin: Secondary | ICD-10-CM

## 2021-01-07 DIAGNOSIS — R4182 Altered mental status, unspecified: Secondary | ICD-10-CM | POA: Diagnosis not present

## 2021-01-07 DIAGNOSIS — M797 Fibromyalgia: Secondary | ICD-10-CM | POA: Diagnosis present

## 2021-01-07 DIAGNOSIS — Z79818 Long term (current) use of other agents affecting estrogen receptors and estrogen levels: Secondary | ICD-10-CM | POA: Diagnosis not present

## 2021-01-07 DIAGNOSIS — D6489 Other specified anemias: Secondary | ICD-10-CM | POA: Diagnosis present

## 2021-01-07 DIAGNOSIS — J449 Chronic obstructive pulmonary disease, unspecified: Secondary | ICD-10-CM | POA: Diagnosis present

## 2021-01-07 DIAGNOSIS — E1151 Type 2 diabetes mellitus with diabetic peripheral angiopathy without gangrene: Secondary | ICD-10-CM | POA: Diagnosis present

## 2021-01-07 DIAGNOSIS — E785 Hyperlipidemia, unspecified: Secondary | ICD-10-CM | POA: Diagnosis present

## 2021-01-07 DIAGNOSIS — F32A Depression, unspecified: Secondary | ICD-10-CM | POA: Diagnosis present

## 2021-01-07 DIAGNOSIS — E039 Hypothyroidism, unspecified: Secondary | ICD-10-CM | POA: Diagnosis not present

## 2021-01-07 DIAGNOSIS — E119 Type 2 diabetes mellitus without complications: Secondary | ICD-10-CM

## 2021-01-07 DIAGNOSIS — E1159 Type 2 diabetes mellitus with other circulatory complications: Secondary | ICD-10-CM | POA: Diagnosis not present

## 2021-01-07 DIAGNOSIS — Z90710 Acquired absence of both cervix and uterus: Secondary | ICD-10-CM

## 2021-01-07 DIAGNOSIS — A419 Sepsis, unspecified organism: Secondary | ICD-10-CM | POA: Diagnosis not present

## 2021-01-07 DIAGNOSIS — Z9181 History of falling: Secondary | ICD-10-CM | POA: Diagnosis not present

## 2021-01-07 DIAGNOSIS — D509 Iron deficiency anemia, unspecified: Secondary | ICD-10-CM | POA: Diagnosis not present

## 2021-01-07 DIAGNOSIS — E871 Hypo-osmolality and hyponatremia: Secondary | ICD-10-CM | POA: Diagnosis present

## 2021-01-07 DIAGNOSIS — I110 Hypertensive heart disease with heart failure: Secondary | ICD-10-CM | POA: Diagnosis present

## 2021-01-07 DIAGNOSIS — E43 Unspecified severe protein-calorie malnutrition: Secondary | ICD-10-CM | POA: Diagnosis not present

## 2021-01-07 DIAGNOSIS — Z7952 Long term (current) use of systemic steroids: Secondary | ICD-10-CM

## 2021-01-07 DIAGNOSIS — Z801 Family history of malignant neoplasm of trachea, bronchus and lung: Secondary | ICD-10-CM

## 2021-01-07 DIAGNOSIS — R410 Disorientation, unspecified: Secondary | ICD-10-CM | POA: Diagnosis not present

## 2021-01-07 DIAGNOSIS — Z8249 Family history of ischemic heart disease and other diseases of the circulatory system: Secondary | ICD-10-CM

## 2021-01-07 DIAGNOSIS — N390 Urinary tract infection, site not specified: Secondary | ICD-10-CM | POA: Diagnosis not present

## 2021-01-07 DIAGNOSIS — J45909 Unspecified asthma, uncomplicated: Secondary | ICD-10-CM | POA: Diagnosis present

## 2021-01-07 DIAGNOSIS — K5900 Constipation, unspecified: Secondary | ICD-10-CM | POA: Diagnosis present

## 2021-01-07 DIAGNOSIS — R0689 Other abnormalities of breathing: Secondary | ICD-10-CM | POA: Diagnosis not present

## 2021-01-07 DIAGNOSIS — J4530 Mild persistent asthma, uncomplicated: Secondary | ICD-10-CM

## 2021-01-07 DIAGNOSIS — B3741 Candidal cystitis and urethritis: Secondary | ICD-10-CM | POA: Diagnosis present

## 2021-01-07 DIAGNOSIS — F411 Generalized anxiety disorder: Secondary | ICD-10-CM | POA: Diagnosis not present

## 2021-01-07 DIAGNOSIS — K6289 Other specified diseases of anus and rectum: Secondary | ICD-10-CM | POA: Diagnosis present

## 2021-01-07 DIAGNOSIS — Z20822 Contact with and (suspected) exposure to covid-19: Secondary | ICD-10-CM | POA: Diagnosis not present

## 2021-01-07 DIAGNOSIS — Z79899 Other long term (current) drug therapy: Secondary | ICD-10-CM

## 2021-01-07 DIAGNOSIS — Z79890 Hormone replacement therapy (postmenopausal): Secondary | ICD-10-CM

## 2021-01-07 DIAGNOSIS — I10 Essential (primary) hypertension: Secondary | ICD-10-CM | POA: Diagnosis not present

## 2021-01-07 DIAGNOSIS — Z87440 Personal history of urinary (tract) infections: Secondary | ICD-10-CM | POA: Diagnosis not present

## 2021-01-07 DIAGNOSIS — R404 Transient alteration of awareness: Secondary | ICD-10-CM | POA: Diagnosis not present

## 2021-01-07 DIAGNOSIS — Z8631 Personal history of diabetic foot ulcer: Secondary | ICD-10-CM | POA: Diagnosis not present

## 2021-01-07 LAB — URINALYSIS, MICROSCOPIC (REFLEX): WBC, UA: >50 WBC/hpf (ref 0–5)

## 2021-01-07 LAB — COMPREHENSIVE METABOLIC PANEL
ALT: 20 U/L (ref 0–44)
AST: 20 U/L (ref 15–41)
Albumin: 3.1 g/dL — ABNORMAL LOW (ref 3.5–5.0)
Alkaline Phosphatase: 50 U/L (ref 38–126)
Anion gap: 9 (ref 5–15)
BUN: 23 mg/dL (ref 8–23)
CO2: 24 mmol/L (ref 22–32)
Calcium: 9.4 mg/dL (ref 8.9–10.3)
Chloride: 99 mmol/L (ref 98–111)
Creatinine, Ser: 0.86 mg/dL (ref 0.44–1.00)
GFR, Estimated: >60 mL/min (ref >60)
Glucose, Bld: 175 mg/dL — ABNORMAL HIGH (ref 70–99)
Potassium: 4 mmol/L (ref 3.5–5.1)
Sodium: 132 mmol/L — ABNORMAL LOW (ref 135–145)
Total Bilirubin: 0.8 mg/dL (ref 0.3–1.2)
Total Protein: 6.1 g/dL — ABNORMAL LOW (ref 6.5–8.1)

## 2021-01-07 LAB — CBC WITH DIFFERENTIAL/PLATELET
Abs Immature Granulocytes: 0 10*3/uL (ref 0.00–0.07)
Basophils Absolute: 0 10*3/uL (ref 0.0–0.1)
Basophils Relative: 0 %
Eosinophils Absolute: 0.3 10*3/uL (ref 0.0–0.5)
Eosinophils Relative: 2 %
HCT: 31.5 % — ABNORMAL LOW (ref 36.0–46.0)
Hemoglobin: 10.2 g/dL — ABNORMAL LOW (ref 12.0–15.0)
Lymphocytes Relative: 17 %
Lymphs Abs: 2.8 10*3/uL (ref 0.7–4.0)
MCH: 37.1 pg — ABNORMAL HIGH (ref 26.0–34.0)
MCHC: 32.4 g/dL (ref 30.0–36.0)
MCV: 114.5 fL — ABNORMAL HIGH (ref 80.0–100.0)
Monocytes Absolute: 0.7 10*3/uL (ref 0.1–1.0)
Monocytes Relative: 4 %
Neutro Abs: 12.8 10*3/uL — ABNORMAL HIGH (ref 1.7–7.7)
Neutrophils Relative %: 77 %
Platelets: 365 10*3/uL (ref 150–400)
RBC: 2.75 MIL/uL — ABNORMAL LOW (ref 3.87–5.11)
RDW: 13.2 % (ref 11.5–15.5)
WBC: 16.6 10*3/uL — ABNORMAL HIGH (ref 4.0–10.5)
nRBC: 0 % (ref 0.0–0.2)
nRBC: 0 /100 WBC

## 2021-01-07 LAB — URINALYSIS, ROUTINE W REFLEX MICROSCOPIC
Bilirubin Urine: NEGATIVE
Glucose, UA: NEGATIVE mg/dL
Ketones, ur: NEGATIVE mg/dL
Nitrite: NEGATIVE
Protein, ur: 30 mg/dL — AB
Specific Gravity, Urine: 1.015 (ref 1.005–1.030)
pH: 6 (ref 5.0–8.0)

## 2021-01-07 LAB — I-STAT VENOUS BLOOD GAS, ED
Acid-Base Excess: 3 mmol/L — ABNORMAL HIGH (ref 0.0–2.0)
Bicarbonate: 27.2 mmol/L (ref 20.0–28.0)
Calcium, Ion: 1.22 mmol/L (ref 1.15–1.40)
HCT: 31 % — ABNORMAL LOW (ref 36.0–46.0)
Hemoglobin: 10.5 g/dL — ABNORMAL LOW (ref 12.0–15.0)
O2 Saturation: 54 %
Potassium: 3.9 mmol/L (ref 3.5–5.1)
Sodium: 134 mmol/L — ABNORMAL LOW (ref 135–145)
TCO2: 28 mmol/L (ref 22–32)
pCO2, Ven: 39.3 mmHg — ABNORMAL LOW (ref 44.0–60.0)
pH, Ven: 7.448 — ABNORMAL HIGH (ref 7.250–7.430)
pO2, Ven: 27 mmHg — CL (ref 32.0–45.0)

## 2021-01-07 LAB — LACTIC ACID, PLASMA
Lactic Acid, Venous: 2.1 mmol/L (ref 0.5–1.9)
Lactic Acid, Venous: 0.8 mmol/L (ref 0.5–1.9)

## 2021-01-07 LAB — CBG MONITORING, ED: Glucose-Capillary: 230 mg/dL — ABNORMAL HIGH (ref 70–99)

## 2021-01-07 LAB — ACETAMINOPHEN LEVEL: Acetaminophen (Tylenol), Serum: 21 ug/mL (ref 10–30)

## 2021-01-07 MED ORDER — ENSURE ENLIVE PO LIQD
237.0000 mL | Freq: Three times a day (TID) | ORAL | Status: DC
  Administered 2021-01-08 – 2021-01-12 (×8): 237 mL via ORAL
  Filled 2021-01-07: qty 237

## 2021-01-07 MED ORDER — MONTELUKAST SODIUM 10 MG PO TABS
10.0000 mg | ORAL_TABLET | Freq: Every evening | ORAL | Status: DC
  Administered 2021-01-07 – 2021-01-11 (×5): 10 mg via ORAL
  Filled 2021-01-07 (×6): qty 1

## 2021-01-07 MED ORDER — DULOXETINE HCL 30 MG PO CPEP: 30.0000 mg | ORAL_CAPSULE | Freq: Every morning | ORAL | Status: DC

## 2021-01-07 MED ORDER — BACLOFEN 10 MG PO TABS
10.0000 mg | ORAL_TABLET | Freq: Every evening | ORAL | Status: DC
  Administered 2021-01-08 – 2021-01-11 (×4): 10 mg via ORAL
  Filled 2021-01-07 (×5): qty 1

## 2021-01-07 MED ORDER — OXYCODONE-ACETAMINOPHEN 7.5-325 MG PO TABS
1.0000 | ORAL_TABLET | ORAL | Status: DC | PRN
  Administered 2021-01-07 – 2021-01-12 (×7): 1 via ORAL
  Filled 2021-01-07 (×7): qty 1

## 2021-01-07 MED ORDER — ASCORBIC ACID 500 MG PO TABS
1000.0000 mg | ORAL_TABLET | Freq: Two times a day (BID) | ORAL | Status: DC
  Administered 2021-01-07 – 2021-01-12 (×10): 1000 mg via ORAL
  Filled 2021-01-07 (×10): qty 2

## 2021-01-07 MED ORDER — ALBUTEROL SULFATE (2.5 MG/3ML) 0.083% IN NEBU: 3.0000 mL | INHALATION_SOLUTION | Freq: Four times a day (QID) | RESPIRATORY_TRACT | Status: DC | PRN

## 2021-01-07 MED ORDER — INSULIN ASPART 100 UNIT/ML IJ SOLN
0.0000 [IU] | Freq: Three times a day (TID) | INTRAMUSCULAR | Status: DC
  Administered 2021-01-08: 2 [IU] via SUBCUTANEOUS
  Administered 2021-01-08: 3 [IU] via SUBCUTANEOUS
  Administered 2021-01-09: 2 [IU] via SUBCUTANEOUS
  Administered 2021-01-09 (×2): 1 [IU] via SUBCUTANEOUS
  Administered 2021-01-10: 2 [IU] via SUBCUTANEOUS
  Administered 2021-01-10: 1 [IU] via SUBCUTANEOUS
  Administered 2021-01-11: 2 [IU] via SUBCUTANEOUS
  Administered 2021-01-11: 1 [IU] via SUBCUTANEOUS
  Administered 2021-01-11: 2 [IU] via SUBCUTANEOUS
  Administered 2021-01-12: 1 [IU] via SUBCUTANEOUS

## 2021-01-07 MED ORDER — PREDNISONE 1 MG PO TABS
2.0000 mg | ORAL_TABLET | Freq: Every day | ORAL | Status: DC
  Administered 2021-01-09 – 2021-01-12 (×4): 2 mg via ORAL
  Filled 2021-01-07 (×5): qty 2

## 2021-01-07 MED ORDER — FLUTICASONE-UMECLIDIN-VILANT 100-62.5-25 MCG/ACT IN AEPB: 1.0000 | INHALATION_SPRAY | Freq: Every morning | RESPIRATORY_TRACT | Status: DC

## 2021-01-07 MED ORDER — ACETAMINOPHEN ER 650 MG PO TBCR: 1300.0000 mg | EXTENDED_RELEASE_TABLET | ORAL | Status: DC

## 2021-01-07 MED ORDER — SODIUM CHLORIDE 0.9 % IV SOLN
1.0000 g | Freq: Once | INTRAVENOUS | Status: AC
  Administered 2021-01-07: 1 g via INTRAVENOUS
  Filled 2021-01-07: qty 10

## 2021-01-07 MED ORDER — DULOXETINE HCL 60 MG PO CPEP: 60.0000 mg | ORAL_CAPSULE | Freq: Every morning | ORAL | Status: DC

## 2021-01-07 MED ORDER — INSULIN ASPART 100 UNIT/ML IJ SOLN
5.0000 [IU] | Freq: Once | INTRAMUSCULAR | Status: AC
  Administered 2021-01-08: 5 [IU] via SUBCUTANEOUS

## 2021-01-07 MED ORDER — AMITRIPTYLINE HCL 10 MG PO TABS
10.0000 mg | ORAL_TABLET | Freq: Every day | ORAL | Status: DC
  Administered 2021-01-07 – 2021-01-11 (×5): 10 mg via ORAL
  Filled 2021-01-07 (×6): qty 1

## 2021-01-07 MED ORDER — CALCIUM CARBONATE 1250 (500 CA) MG PO TABS
600.0000 mg | ORAL_TABLET | Freq: Two times a day (BID) | ORAL | Status: DC
  Administered 2021-01-08 – 2021-01-12 (×9): 625 mg via ORAL
  Filled 2021-01-07 (×9): qty 1

## 2021-01-07 MED ORDER — DULOXETINE HCL 60 MG PO CPEP
90.0000 mg | ORAL_CAPSULE | Freq: Every day | ORAL | Status: DC
  Administered 2021-01-08 – 2021-01-12 (×5): 90 mg via ORAL
  Filled 2021-01-07 (×6): qty 1

## 2021-01-07 MED ORDER — GABAPENTIN 100 MG PO CAPS
100.0000 mg | ORAL_CAPSULE | Freq: Every day | ORAL | Status: DC
  Administered 2021-01-08 – 2021-01-11 (×4): 100 mg via ORAL
  Filled 2021-01-07 (×4): qty 1

## 2021-01-07 MED ORDER — OXYCODONE-ACETAMINOPHEN 7.5-325 MG PO TABS: 1.0000 | ORAL_TABLET | ORAL | Status: DC

## 2021-01-07 MED ORDER — LEVOTHYROXINE SODIUM 50 MCG PO TABS
50.0000 ug | ORAL_TABLET | Freq: Every day | ORAL | Status: DC
  Administered 2021-01-09 – 2021-01-12 (×4): 50 ug via ORAL
  Filled 2021-01-07 (×4): qty 1

## 2021-01-07 MED ORDER — IPRATROPIUM-ALBUTEROL 0.5-2.5 (3) MG/3ML IN SOLN
3.0000 mL | Freq: Three times a day (TID) | RESPIRATORY_TRACT | Status: DC
  Administered 2021-01-07 – 2021-01-08 (×2): 3 mL via RESPIRATORY_TRACT
  Filled 2021-01-07 (×3): qty 3

## 2021-01-07 MED ORDER — LACTATED RINGERS IV BOLUS (SEPSIS)
1000.0000 mL | Freq: Once | INTRAVENOUS | Status: AC
  Administered 2021-01-07: 1000 mL via INTRAVENOUS

## 2021-01-07 MED ORDER — ENOXAPARIN SODIUM 40 MG/0.4ML IJ SOSY
40.0000 mg | PREFILLED_SYRINGE | INTRAMUSCULAR | Status: DC
Start: 2021-01-07 — End: 2021-01-09
  Administered 2021-01-07 – 2021-01-08 (×2): 40 mg via SUBCUTANEOUS
  Filled 2021-01-07 (×2): qty 0.4

## 2021-01-07 MED ORDER — VITAMIN D 25 MCG (1000 UNIT) PO TABS
2000.0000 [IU] | ORAL_TABLET | Freq: Every morning | ORAL | Status: DC
  Administered 2021-01-09 – 2021-01-12 (×4): 2000 [IU] via ORAL
  Filled 2021-01-07 (×4): qty 2

## 2021-01-07 MED ORDER — LACTATED RINGERS IV SOLN: INTRAVENOUS | Status: DC

## 2021-01-07 MED ORDER — SODIUM CHLORIDE 0.9 % IV SOLN: 1.0000 g | INTRAVENOUS | Status: DC

## 2021-01-07 MED ORDER — PREDNISONE 1 MG PO TABS: 2.0000 mg | ORAL_TABLET | Freq: Every day | ORAL | Status: DC

## 2021-01-07 MED ORDER — FLUTICASONE FUROATE-VILANTEROL 100-25 MCG/ACT IN AEPB
1.0000 | INHALATION_SPRAY | Freq: Every day | RESPIRATORY_TRACT | Status: DC
  Filled 2021-01-07 (×2): qty 28

## 2021-01-07 MED ORDER — ROSUVASTATIN CALCIUM 5 MG PO TABS
10.0000 mg | ORAL_TABLET | Freq: Every evening | ORAL | Status: DC
  Administered 2021-01-07 – 2021-01-11 (×5): 10 mg via ORAL
  Filled 2021-01-07 (×5): qty 2

## 2021-01-07 MED ORDER — PSEUDOEPHEDRINE-GUAIFENESIN ER 120-1200 MG PO TB12: 1200.0000 mg | ORAL_TABLET | Freq: Two times a day (BID) | ORAL | Status: DC

## 2021-01-07 MED ORDER — UMECLIDINIUM BROMIDE 62.5 MCG/ACT IN AEPB
1.0000 | INHALATION_SPRAY | Freq: Every day | RESPIRATORY_TRACT | Status: DC
  Filled 2021-01-07 (×2): qty 7

## 2021-01-07 NOTE — ED Notes (Signed)
Updated grandaughter.  She states that pt's confusion has been increasing over the last 6 months making identifying a UTI more difficult as that is the only sign she gives.    Grandaughter would like to know if we could do a CT to investigate possible progressing dementia.

## 2021-01-07 NOTE — ED Provider Notes (Signed)
[facility] EMERGENCY DEPARTMENT Provider Note   CSN: [id number] Arrival date & time: 01/07/21  1442     History No chief complaint on file.   Kayla Thompson is a 85 y.o. female.  HPI  85 year old female past medical history of dementia, HTN, DM, COPD, GERD, chronic UTIs currently on oral antibiotic therapy presents the emergency department accompanied by aide for concern of change in mental status.  This current aide only works with the patient on Wednesdays.  She is unsure when her last known normal would have been.  She states since being with her today the patient has been very confused and having fevers at home.  At one point did not recognize the aid in did not recognize that she was at home.  She has had poor oral intake today.  Patient states that she feels generally lousy but is otherwise a poor historian.  The aide states that she was also significantly weak and she was unable to maneuver her to the restroom which is new.  No noted facial droop, slurred speech or unilateral weakness.  Past Medical History:  Diagnosis Date   Anxiety    Arthritis    Asthma    Cataracts, bilateral    removed by surgery   COPD (chronic obstructive pulmonary disease) ([place])    Depression    Diabetes mellitus without complication ([place])    type 2   Dyspnea    with exertion   Fibromyalgia    GERD (gastroesophageal reflux disease)    History of chemotherapy    History of fractured pelvis    Hypertension    Hypothyroidism    Osteoporosis 12/2017   T score -2.8 stable from prior DEXA   Osteoporosis    Ovarian cancer ([place])    Pneumonia    several times    Patient Active Problem List   Diagnosis Date Noted   Chronic obstructive pulmonary disease ([place]) 11/20/2020   Urinary tract infection without hematuria    Other chronic pain    UTI due to extended-spectrum beta lactamase (ESBL) producing Escherichia coli 07/30/2020   AMS (altered mental status) 07/07/2020    Hypertension associated with diabetes ([place]) 05/07/2020   Hyperlipidemia associated with type 2 diabetes mellitus ([place]) 05/07/2020   Depression 05/07/2020   PAD (peripheral artery disease) ([place]) 02/05/2020   Lower extremity edema 11/20/2019   UTI symptoms 05/29/2019   Dysuria 05/11/2019   Upper esophageal web 03/27/2019   Iron deficiency anemia 03/27/2019   Candida esophagitis ([place]) 02/14/2019   Acute lower UTI 94/70/9628   Acute metabolic encephalopathy 36/62/9476   Encounter for general adult medical examination with abnormal findings 01/09/2019   Abnormal barium swallow 12/28/2018   History of colonic polyps 12/28/2018   Dysphagia 12/28/2018   Diabetic foot ulcer ([place]) 12/05/2018   Rotator cuff arthropathy, left 11/16/2018   Degenerative arthritis of left knee 11/16/2018   Insomnia 09/26/2018   Leukocytosis 10/17/2017   Severe protein-calorie malnutrition ([place]) 10/10/2017   Memory changes 07/29/2017   Granulomatous lung disease ([place]) 06/14/2017   Tracheobronchomalacia 06/14/2017   Dizziness 03/04/2017   Exercise hypoxemia 07/12/2016   Chronic heart failure with preserved ejection fraction ([place]) 12/25/2015   GERD (gastroesophageal reflux disease) 12/25/2015   Epidermal inclusion cyst 09/02/2015   Muscle cramps 03/06/2015   Cough 01/07/2015   Bronchiectasis without acute exacerbation ([place]) 01/07/2015   PMR (polymyalgia rheumatica) ([place]) 01/07/2015   Osteoarthritis 01/07/2015   Asthma, chronic 11/12/2014   Anxiety state 11/12/2014  Hyperparathyroidism ([place]) 10/28/2014   Fibromyalgia 09/21/2014   Personal history of ovarian cancer 08/23/2014   Osteoporosis 08/23/2014   DM2 (diabetes mellitus, type 2) ([place]) 08/02/2014   HLD (hyperlipidemia) 08/02/2014   Hypothyroidism 08/02/2014    Past Surgical History:  Procedure Laterality Date   ABDOMINAL HYSTERECTOMY  1996   ANKLE FRACTURE SURGERY     plate and screws   BIOPSY  02/05/2019   Procedure: BIOPSY;  Surgeon:  Irving Copas., MD;  Location: [facility] ENDOSCOPY;  Service: Gastroenterology;;   BLADDER SUSPENSION     CATARACT EXTRACTION     COLONOSCOPY     ESOPHAGEAL BRUSHING  02/05/2019   Procedure: ESOPHAGEAL BRUSHING;  Surgeon: Irving Copas., MD;  Location: [facility] ENDOSCOPY;  Service: Gastroenterology;;   ESOPHAGOGASTRODUODENOSCOPY (EGD) WITH PROPOFOL N/A 02/05/2019   Procedure: ESOPHAGOGASTRODUODENOSCOPY (EGD) WITH PROPOFOL with dialtion;  Surgeon: Irving Copas., MD;  Location: [facility];  Service: Gastroenterology;  Laterality: N/A;   EYE SURGERY     cataracts removed-bilateral   hip surgey     knee surgey     LUNG SURGERY     OOPHORECTOMY     BSO   SAVORY DILATION N/A 02/05/2019   Procedure: SAVORY DILATION;  Surgeon: Irving Copas., MD;  Location: [place] [facility];  Service: Gastroenterology;  Laterality: N/A;   TONSILLECTOMY     UPPER GI ENDOSCOPY       OB History     Gravida  1   Para  1   Term      Preterm      AB      Living  1      SAB      IAB      Ectopic      Multiple      Live Births              Family History  Problem Relation Age of Onset   Lung cancer Mother    CAD Father    Diabetes Father    Lung cancer Maternal Aunt    Diabetes Paternal Aunt    Diabetes Paternal Uncle    Diabetes Paternal Grandmother    Diabetes Paternal Grandfather    Colon cancer Neg Hx    Esophageal cancer Neg Hx    Inflammatory bowel disease Neg Hx    Liver disease Neg Hx    Pancreatic cancer Neg Hx    Rectal cancer Neg Hx    Stomach cancer Neg Hx     Social History   Tobacco Use   Smoking status: Never   Smokeless tobacco: Never  Vaping Use   Vaping Use: Never used  Substance Use Topics   Alcohol use: No    Alcohol/week: 0.0 standard drinks   Drug use: No    Home Medications Prior to Admission medications   Medication Sig Start Date End Date Taking? Authorizing Provider  acetaminophen (TYLENOL) 650 MG CR tablet Take  1,300 mg by mouth every 4 (four) hours.    [provider]  albuterol (PROAIR HFA) 108 (90 Base) MCG/ACT inhaler Inhale 2 puffs into the lungs every 6 (six) hours as needed for wheezing or shortness of breath. 08/27/19   Baird Lyons D, MD  amitriptyline (ELAVIL) 10 MG tablet TAKE 1 TABLET BY MOUTH EVERYDAY AT BEDTIME 05/19/20   [name], MD  Apoaequorin (PREVAGEN PO) Take 1 tablet by mouth daily.    [provider]  Ascorbic Acid (VITAMIN C) 1000 MG tablet Take 1,000  mg by mouth 2 (two) times daily.    [provider]  baclofen (LIORESAL) 10 MG tablet TAKE 0.5-1 TABLET BY MOUTH TWICE DAILY AS NEEDED FOR MUSCLE SPASMS OR PAIN 06/19/20   [name], MD  BD PEN NEEDLE NANO 2ND GEN 32G X 4 MM MISC USE AS DIRECTED 4 TIMES A DAY 10/22/20   [name], MD  busPIRone (BUSPAR) 5 MG tablet Take 5 mg by mouth as needed (take 1-2 tabs as needed).    [provider]  calcium carbonate (OSCAL) 1500 (600 Ca) MG TABS tablet Take 600 mg of elemental calcium by mouth 2 (two) times daily with a meal.    [provider]  cholecalciferol (VITAMIN D3) 25 MCG (1000 UNIT) tablet Take 1,000 Units by mouth daily.    [provider]  clotrimazole-betamethasone (LOTRISONE) cream APPLY TO AFFECTED AREA TWICE A DAY 02/26/20   [name], MD  Cyanocobalamin (VITAMIN B 12 PO) Take 2,500 mg by mouth once a week.    [provider]  denosumab (PROLIA) 60 MG/ML SOSY injection Inject 60 mg into the skin every 6 (six) months.    [provider]  docusate sodium (COLACE) 100 MG capsule Take 100 mg by mouth as needed for mild constipation.    [provider]  DULoxetine (CYMBALTA) 30 MG capsule Take 1 capsule (30 mg total) by mouth daily. 05/19/20   [name], MD  DULoxetine (CYMBALTA) 60 MG capsule Take 1 capsule (60 mg total) by mouth daily. 05/19/20   [name], MD  estradiol (ESTRACE) 0.1  MG/GM vaginal cream Place 1 Applicatorful vaginally See admin instructions. Takes 2 times a week 06/28/20   [provider]  feeding supplement (ENSURE ENLIVE / ENSURE PLUS) LIQD Take 237 mLs by mouth 2 (two) times daily between meals. 01/16/20   Hongalgi, Lenis Dickinson, MD  fluconazole (DIFLUCAN) 100 MG tablet Take 100 mg by mouth daily. For 7 days Patient not taking: Reported on 12/11/2020 11/17/20   [provider]  furosemide (LASIX) 20 MG tablet Take 1 tablet (20 mg total) by mouth daily. 11/27/20   Chandrasekhar, Lyda Kalata A, MD  gabapentin (NEURONTIN) 100 MG capsule Take 100-200 mg by mouth at bedtime. 11/03/20   [provider]  glucose blood (FREESTYLE LITE) test strip USE TO TEST BLOOD SUGAR 3 TIMES DAILY. 09/19/20   Philemon Kingdom, MD  hydrocortisone 2.5 % ointment Apply topically 2 (two) times daily. 11/11/20   [provider]  insulin glargine (LANTUS) 100 UNIT/ML Solostar Pen Inject 5 Units into the skin every morning.    [provider]  insulin lispro (HUMALOG KWIKPEN) 100 UNIT/ML KwikPen Inject 2-4 Units into the skin daily before supper. 12/18/20   Philemon Kingdom, MD  ipratropium-albuterol (DUONEB) 0.5-2.5 (3) MG/3ML SOLN USE 1 VIAL IN NEBULIZER 3 TIMES DAILY 09/23/20   Baird Lyons D, MD  Lancets (FREESTYLE) lancets Use to test blood sugar 3 times daily. Dx: E11.65 10/14/16   Philemon Kingdom, MD  levothyroxine (SYNTHROID) 50 MCG tablet TAKE 1 TABLET BY MOUTH EVERY DAY BEFORE BREAKFAST 10/02/20   [name], MD  lidocaine (XYLOCAINE) 5 % ointment Apply 1 application topically as needed. Apply to external vulvar area 10/11/20   Princess Bruins, MD  Lidocaine 4 % PTCH Apply 1 patch topically 2 (two) times daily as needed (pain).    [provider]  magnesium oxide (MAG-OX) 400 MG tablet TAKE 1 TABLET BY MOUTH EVERY DAY 07/30/20  [name], MD  melatonin 1 MG TABS tablet Take 1 mg by mouth at bedtime as needed.     [provider]  methenamine (MANDELAMINE) 1 g tablet Take 1,000 mg by mouth 2 (two) times daily.    [provider]  Misc Natural Products (TART CHERRY ADVANCED PO) Take 1 tablet by mouth daily.    [provider]  montelukast (SINGULAIR) 10 MG tablet TAKE 1 TABLET BY MOUTH EVERY DAY 06/19/20   Baird Lyons D, MD  Multiple Vitamin (MULTIVITAMIN WITH MINERALS) TABS tablet Take 1 tablet by mouth daily. Centrum    [provider]  naloxone [facility]) nasal spray 4 mg/0.1 mL Place 1 spray into the nose once as needed (overdose). 06/27/20   [provider]  nitrofurantoin, macrocrystal-monohydrate, (MACROBID) 100 MG capsule Take 100 mg by mouth daily. 11/15/20   [provider]  omeprazole (PRILOSEC) 20 MG capsule TAKE 1 CAPSULE TWICE A DAY BEFORE A MEAL 10/03/20   Mansouraty, Telford Nab., MD  oxyCODONE-acetaminophen (PERCOCET) 7.5-325 MG tablet Take 1 tablet by mouth 5 (five) times daily as needed. 10/23/20   [provider]  polyethylene glycol (MIRALAX / GLYCOLAX) 17 g packet Take 17 g by mouth daily.    [provider]  predniSONE (DELTASONE) 1 MG tablet Take 2 tablets (2 mg total) by mouth daily. 07/25/18   [name], MD  Propylene Glycol (SYSTANE COMPLETE OP) Apply 1-2 drops to eye as needed.    [provider]  Pseudoephedrine-guaiFENesin (MUCINEX D MAX STRENGTH PO) Take 1,200 mg by mouth in the morning and at bedtime.    [provider]  rosuvastatin (CRESTOR) 10 MG tablet Take 1 tablet (10 mg total) by mouth every evening. 05/19/20   [name], MD  TRELEGY ELLIPTA 100-62.5-25 MCG/INH AEPB INHALE 1 PUFF INTO THE LUNGS DAILY. RINSE MOUTH 07/05/20   Baird Lyons D, MD  TURMERIC PO Take 1 tablet by mouth daily. Take 1 tab (1000 mg) daily    [provider]    Allergies    Latex and Penicillins  Review of Systems   Review of Systems  Unable to perform ROS: Dementia   Physical  Exam Updated Vital Signs BP (!) 151/72   Pulse 90   Temp 97.7 F (36.5 C) (Oral)   Resp 19   Ht 4\' 11"  (1.499 m)   Wt 48.3 kg   LMP  (LMP Unknown)   SpO2 98%   BMI 21.51 kg/m   Physical Exam Vitals and nursing note reviewed.  Constitutional:      General: She is not in acute distress.    Appearance: Normal appearance.  HENT:     Head: Normocephalic.     Mouth/Throat:     Mouth: Mucous membranes are moist.  Cardiovascular:     Rate and Rhythm: Normal rate.  Pulmonary:     Effort: Pulmonary effort is normal. No respiratory distress.  Abdominal:     Palpations: Abdomen is soft.     Tenderness: There is no abdominal tenderness.  Skin:    General: Skin is warm.  Neurological:     Mental Status: She is alert.     Cranial Nerves: No cranial nerve deficit.     Comments: Oriented to self, place and aid. Globally weak but nonfocal  Psychiatric:        Mood and Affect: Mood normal.    ED Results / Procedures / Treatments   Labs (all labs ordered are listed, but  only abnormal results are displayed) Labs Reviewed  CBC WITH DIFFERENTIAL/PLATELET  COMPREHENSIVE METABOLIC PANEL  URINALYSIS, ROUTINE W REFLEX MICROSCOPIC  BLOOD GAS, VENOUS    EKG EKG Interpretation  Date/Time:  Wednesday January 07 2021 14:54:12 EDT Ventricular Rate:  92 PR Interval:  133 QRS Duration: 84 QT Interval:  369 QTC Calculation: 457 R Axis:   62 Text Interpretation: Sinus rhythm Atrial premature complex Sinus pause NSR, PAC, similar to previous Confirmed by Lavenia Atlas [phone]) on 01/07/2021 3:05:33 PM  Radiology No results found.  Procedures .Critical Care Performed by: Lorelle Gibbs, DO Authorized by: Lorelle Gibbs, DO   Critical care provider statement:    Critical care time (minutes):  45   Critical care time was exclusive of:  Separately billable procedures and treating other patients   Critical care was necessary to treat or prevent imminent or life-threatening  deterioration of the following conditions:  Sepsis   Critical care was time spent personally by me on the following activities:  Evaluation of patient's response to treatment, examination of patient, review of old charts, re-evaluation of patient's condition, ordering and performing treatments and interventions, ordering and review of laboratory studies, development of treatment plan with patient or surrogate and discussions with consultants   Care discussed with: admitting provider     Medications Ordered in ED Medications - No data to display  ED Course  I have reviewed the triage vital signs and the nursing notes.  Pertinent labs & imaging results that were available during my care of the patient were reviewed by me and considered in my medical decision making (see chart for details).    MDM Rules/Calculators/A&P                           85 year old female presents emergency department concern for altered mental status.  Patient is on chronic oral antibiotics for recurrent UTIs, in the past secondary to ESBL requiring IV antibiotics.  This is a similar presentation to her previous admissions, last 1 being in May/2022.  Patient is pleasant, oriented to herself but otherwise confused.  Weak on exam, nonambulatory, this is a change from baseline per the aides at bedside.  Patient was tachycardic with EMS, reportedly got a small fluid bolus, afebrile on arrival.  Blood work shows a leukocytosis, mild hyponatremia of 132, lactic of 2.1.  Urinalysis has bacteria and greater than 50 leukocytes in the urinalysis, concerning for ongoing infection in the setting of oral antibiotic therapy.  On reevaluation patient is tachycardic.  We will treat per sepsis protocol, IV antibiotics ordered after reviewing the chart with ED pharmacist.  Patients evaluation and results requires admission for further treatment and care. Patient agrees with admission plan, offers no new complaints and is stable/unchanged at  time of admit.  Final Clinical Impression(s) / ED Diagnoses Final diagnoses:  None    Rx / DC Orders ED Discharge Orders     None        Lorelle Gibbs, DO 01/07/21 1959

## 2021-01-07 NOTE — H&P (Addendum)
History and Physical    HUBERT DERSTINE PNT:[id number] DOB: 25-Aug-1935 DOA: 01/07/2021  PCP: [name], MD  Patient coming from: Home  I have personally briefly reviewed patient's old medical records in [place]  Chief Complaint: Altered mental status  HPI: Kayla Thompson is a 85 y.o. female with medical history significant for HFpEF, PAD, hypertension, history of ESBL UTI, chronic UTI on Macrobid, PMR on chronic steroids, asthma/granulomatous lung disease/COPD, hypothyroidism, type 2 diabetes, GERD and hyperlipidemia who presents with concerns of altered mental status.  Patient is alert and oriented x4 but unable to provide much history.  She has 24-hour home health aide care.  Home health aide at bedside provides history.  Reports that 2 days ago patient was having some generalized weakness.  Last night patient also began to be confused asking where she was.  Today she had nausea and vomiting and worsening confusion and was sent to the ED.  Patient also reports feeling dysuria at one point but unable to tell me when.  Denies any fever.  Patient also noted to have admission back in May for similar symptoms and was treated with IV meropenem for UTI but urine cultures were negative.Symptoms were thought more likely due to dehydration.  In speaking with the granddaughter over the phone, she reports that patient has been having worsening of her confusion over the past 6 months.  She requested whether she could have an MRI of the brain while inpatient versus taking out her outpatient neurologist.  ED Course: She was afebrile, mildly tachycardic with heart rate of 103, normotensive.  Has a leukocytosis of 16.6.  Baseline anemia of 10.2.  Lactate of 2.1.  Sodium of 132.  Potassium of 3.9, creatinine 0.86, blood glucose of 175.  UA shows large leukocyte and negative nitrite.  ED physician Dr. Dina Rich had discussion with pharmacy and felt that it was okay for her to start IV  Rocephin since her recent previous urine cultures were not convincing of ESBL. She was also started on aggressive IV fluid resuscitation and hospitalist was called for admission.  At the time my evaluation, home health aide at bedside feels that she already has had some improvement in her mentation after IV treatments.   Review of Systems: Unable to fully obtain due to altered mental status  Past Medical History:  Diagnosis Date   Anxiety    Arthritis    Asthma    Cataracts, bilateral    removed by surgery   COPD (chronic obstructive pulmonary disease) ([place])    Depression    Diabetes mellitus without complication ([place])    type 2   Dyspnea    with exertion   Fibromyalgia    GERD (gastroesophageal reflux disease)    History of chemotherapy    History of fractured pelvis    Hypertension    Hypothyroidism    Osteoporosis 12/2017   T score -2.8 stable from prior DEXA   Osteoporosis    Ovarian cancer ([place])    Pneumonia    several times    Past Surgical History:  Procedure Laterality Date   ABDOMINAL HYSTERECTOMY  1996   ANKLE FRACTURE SURGERY     plate and screws   BIOPSY  02/05/2019   Procedure: BIOPSY;  Surgeon: Irving Copas., MD;  Location: [facility] ENDOSCOPY;  Service: Gastroenterology;;   BLADDER SUSPENSION     CATARACT EXTRACTION     COLONOSCOPY     ESOPHAGEAL BRUSHING  02/05/2019   Procedure: ESOPHAGEAL  BRUSHING;  Surgeon: Rush Landmark Telford Nab., MD;  Location: [place];  Service: Gastroenterology;;   ESOPHAGOGASTRODUODENOSCOPY (EGD) WITH PROPOFOL N/A 02/05/2019   Procedure: ESOPHAGOGASTRODUODENOSCOPY (EGD) WITH PROPOFOL with dialtion;  Surgeon: Rush Landmark Telford Nab., MD;  Location: [place];  Service: Gastroenterology;  Laterality: N/A;   EYE SURGERY     cataracts removed-bilateral   hip surgey     knee surgey     LUNG SURGERY     OOPHORECTOMY     BSO   SAVORY DILATION N/A 02/05/2019   Procedure: SAVORY DILATION;  Surgeon: Irving Copas., MD;  Location: [place];  Service: Gastroenterology;  Laterality: N/A;   TONSILLECTOMY     UPPER GI ENDOSCOPY       reports that she has never smoked. She has never used smokeless tobacco. She reports that she does not drink alcohol and does not use drugs. Social History  Allergies  Allergen Reactions   Tape Other (See Comments)    SKIN IS VERY THIN AND DELICATE- will tear like paper!!   Lasix [Furosemide] Other (See Comments)    Confusion- MD stopped this med   Latex Rash   Penicillins Rash    Has patient had a PCN reaction causing immediate rash, facial/tongue/throat swelling, SOB or lightheadedness with hypotension: No Has patient had a PCN reaction causing severe rash involving mucus membranes or skin necrosis: No Has patient had a PCN reaction that required hospitalization: No Has patient had a PCN reaction occurring within the last 10 years: No If all of the above answers are "NO", then may proceed with Cephalosporin use.     Family History  Problem Relation Age of Onset   Lung cancer Mother    CAD Father    Diabetes Father    Lung cancer Maternal Aunt    Diabetes Paternal Aunt    Diabetes Paternal Uncle    Diabetes Paternal Grandmother    Diabetes Paternal Grandfather    Colon cancer Neg Hx    Esophageal cancer Neg Hx    Inflammatory bowel disease Neg Hx    Liver disease Neg Hx    Pancreatic cancer Neg Hx    Rectal cancer Neg Hx    Stomach cancer Neg Hx      Prior to Admission medications   Medication Sig Start Date End Date Taking? Authorizing Provider  acetaminophen (TYLENOL) 650 MG CR tablet Take 1,300 mg by mouth See admin instructions. Take 1,300 mg by mouth every 4-5 hours IN CONJUNCTION WITH one Percocet 7.5/325 tablet- for pain   Yes [provider]  albuterol (PROAIR HFA) 108 (90 Base) MCG/ACT inhaler Inhale 2 puffs into the lungs every 6 (six) hours as needed for wheezing or shortness of breath. 08/27/19  Yes Young, Tarri Fuller D, MD   amitriptyline (ELAVIL) 10 MG tablet TAKE 1 TABLET BY MOUTH EVERYDAY AT BEDTIME Patient taking differently: Take 10 mg by mouth at bedtime. 05/19/20  Yes [name], MD  Apoaequorin (PREVAGEN PO) Take 1 tablet by mouth every evening.   Yes [provider]  Ascorbic Acid (VITAMIN C) 1000 MG tablet Take 1,000 mg by mouth in the morning and at bedtime.   Yes [provider]  baclofen (LIORESAL) 10 MG tablet TAKE 0.5-1 TABLET BY MOUTH TWICE DAILY AS NEEDED FOR MUSCLE SPASMS OR PAIN Patient taking differently: Take 10 mg by mouth every evening. 06/19/20  Yes [name], MD  busPIRone (BUSPAR) 5 MG tablet Take 5-10 mg by mouth daily as needed (for  anxiety).   Yes [provider]  Calcium Carbonate (CALTRATE 600 PO) Take 600 mg by mouth in the morning and at bedtime.   Yes [provider]  cholecalciferol (VITAMIN D3) 25 MCG (1000 UNIT) tablet Take 2,000 Units by mouth in the morning.   Yes [provider]  clotrimazole-betamethasone (LOTRISONE) cream APPLY TO AFFECTED AREA TWICE A DAY Patient taking differently: Apply 1 application topically 2 (two) times daily as needed (to affected sites). 02/26/20  Yes [name], MD  Cyanocobalamin (VITAMIN B 12 PO) Take 2,500 mcg by mouth every Thursday.   Yes [provider]  denosumab (PROLIA) 60 MG/ML SOSY injection Inject 60 mg into the skin every 6 (six) months.   Yes [provider]  Dextromethorphan-guaiFENesin (MUCINEX DM MAXIMUM STRENGTH) 60-1200 MG TB12 Take 1 tablet by mouth in the morning and at bedtime.   Yes [provider]  DULoxetine (CYMBALTA) 30 MG capsule Take 1 capsule (30 mg total) by mouth daily. Patient taking differently: Take 30 mg by mouth in the morning. 05/19/20  Yes [name], MD  DULoxetine (CYMBALTA) 60 MG capsule Take 1 capsule (60 mg total) by mouth daily. Patient taking differently: Take 60 mg by mouth in the morning.  05/19/20  Yes [name], MD  estradiol (ESTRACE) 0.1 MG/GM vaginal cream Place 1 Applicatorful vaginally See admin instructions. Takes 2 times a week 06/28/20  Yes [provider]  feeding supplement (ENSURE ENLIVE / ENSURE PLUS) LIQD Take 237 mLs by mouth 2 (two) times daily between meals. Patient taking differently: Take 237 mLs by mouth 3 (three) times daily with meals. 01/16/20  Yes Hongalgi, Lenis Dickinson, MD  gabapentin (NEURONTIN) 100 MG capsule Take 100 mg by mouth at bedtime. 11/03/20  Yes [provider]  insulin glargine (LANTUS) 100 UNIT/ML Solostar Pen Inject 5 Units into the skin in the morning.   Yes [provider]  insulin lispro (HUMALOG KWIKPEN) 100 UNIT/ML KwikPen Inject 2-4 Units into the skin daily before supper. Patient taking differently: Inject 2 Units into the skin See admin instructions. Inject 2 units into the skin before meals as needed for a low BGL 12/18/20  Yes Philemon Kingdom, MD  ipratropium-albuterol (DUONEB) 0.5-2.5 (3) MG/3ML SOLN USE 1 VIAL IN NEBULIZER 3 TIMES DAILY Patient taking differently: Take 3 mLs by nebulization in the morning, at noon, and at bedtime. 09/23/20  Yes Young, Tarri Fuller D, MD  levothyroxine (SYNTHROID) 50 MCG tablet TAKE 1 TABLET BY MOUTH EVERY DAY BEFORE BREAKFAST Patient taking differently: Take 50 mcg by mouth daily before breakfast. 10/02/20  Yes [name], MD  Lidocaine 4 % PTCH Apply 1 patch topically 2 (two) times daily as needed (pain).   Yes [provider]  melatonin 1 MG TABS tablet Take 1 mg by mouth at bedtime as needed (for sleep).   Yes [provider]  methenamine (MANDELAMINE) 1 g tablet Take 1,000 mg by mouth in the morning and at bedtime.   Yes [provider]  Misc Natural Products (TART CHERRY ADVANCED PO) Take 1,000 mg by mouth every evening.   Yes [provider]  montelukast (SINGULAIR) 10 MG tablet TAKE 1 TABLET BY MOUTH EVERY DAY Patient  taking differently: Take 10 mg by mouth every evening. 06/19/20  Yes Young, Clinton D, MD  MUCINEX FAST-MAX CONGEST COUGH 2.5-5-100 MG/5ML LIQD Take 2.5-5 mLs by mouth at bedtime as needed (for coughing).   Yes [provider]  Multiple Vitamins-Minerals (CENTRUM SILVER  50+WOMEN) TABS Take 1 tablet by mouth daily with breakfast.   Yes [provider]  nitrofurantoin, macrocrystal-monohydrate, (MACROBID) 100 MG capsule Take 100 mg by mouth daily. 11/15/20  Yes [provider]  omeprazole (PRILOSEC) 20 MG capsule TAKE 1 CAPSULE TWICE A DAY BEFORE A MEAL Patient taking differently: Take 20 mg by mouth 2 (two) times daily before a meal. 10/03/20  Yes Mansouraty, Telford Nab., MD  oxyCODONE-acetaminophen (PERCOCET) 7.5-325 MG tablet Take 1 tablet by mouth See admin instructions. Take 1 tablet by mouth every 4-5 hours (IN CONJUNCTION WITH 1,300 mg of Tylenol) 10/23/20  Yes [provider]  polyethylene glycol (MIRALAX / GLYCOLAX) 17 g packet Take 17 g by mouth daily.   Yes [provider]  predniSONE (DELTASONE) 1 MG tablet Take 2 tablets (2 mg total) by mouth daily. Patient taking differently: Take 1 mg by mouth in the morning and at bedtime. 07/25/18  Yes [name], MD  Propylene Glycol (SYSTANE COMPLETE OP) Place 1-2 drops into both eyes 2 (two) times daily as needed (for dryness).   Yes [provider]  Pseudoephedrine-guaiFENesin (MUCINEX D MAX STRENGTH PO) Take 1,200 mg by mouth in the morning and at bedtime.   Yes [provider]  rosuvastatin (CRESTOR) 10 MG tablet Take 1 tablet (10 mg total) by mouth every evening. 05/19/20  Yes [name], MD  TRELEGY ELLIPTA 100-62.5-25 MCG/INH AEPB INHALE 1 PUFF INTO THE LUNGS DAILY. RINSE MOUTH Patient taking differently: Inhale 1 puff into the lungs in the morning. Rinse mouth 07/05/20  Yes Young, Tarri Fuller D, MD  TURMERIC PO Take 1,000 mg by mouth in the morning.   Yes [provider]  BD PEN NEEDLE NANO 2ND GEN 32G X 4 MM MISC USE AS DIRECTED 4 TIMES A DAY 10/22/20   [name], MD  docusate sodium (COLACE) 100 MG capsule Take 100 mg by mouth as needed for mild constipation. Patient not taking: Reported on 01/07/2021    [provider]  fluconazole (DIFLUCAN) 100 MG tablet Take 100 mg by mouth daily. For 7 days Patient not taking: Reported on 01/07/2021 11/17/20   [provider]  furosemide (LASIX) 20 MG tablet Take 1 tablet (20 mg total) by mouth daily. Patient not taking: Reported on 01/07/2021 11/27/20   Rudean Haskell A, MD  glucose blood (FREESTYLE LITE) test strip USE TO TEST BLOOD SUGAR 3 TIMES DAILY. 09/19/20   Philemon Kingdom, MD  hydrocortisone 2.5 % ointment Apply 1 application topically 2 (two) times daily as needed (to affected sites). 11/11/20   [provider]  Lancets (FREESTYLE) lancets Use to test blood sugar 3 times daily. Dx: E11.65 10/14/16   Philemon Kingdom, MD  lidocaine (XYLOCAINE) 5 % ointment Apply 1 application topically as needed. Apply to external vulvar area Patient not taking: No sig reported 10/11/20   Princess Bruins, MD  magnesium oxide (MAG-OX) 400 MG tablet TAKE 1 TABLET BY MOUTH EVERY DAY Patient not taking: Reported on 01/07/2021 07/30/20   [name], MD  naloxone [facility]) nasal spray 4 mg/0.1 mL Place 1 spray into the nose once as needed (overdose). 06/27/20   [provider]    Physical Exam: Vitals:   01/07/21 1800 01/07/21 1815 01/07/21 1915 01/07/21 1945  BP: (!) 143/73 139/79 (!) 144/69 (!) 115/56  Pulse: 92 93 97 (!) 103  Resp: 19 19 17 18   Temp:      TempSrc:      SpO2: 97% 97%  97% 96%  Weight:      Height:        Constitutional: NAD, calm, comfortable, thin frail appearing female sitting upright in bed eating crackers Vitals:   01/07/21 1800 01/07/21 1815 01/07/21 1915 01/07/21 1945  BP: (!) 143/73 139/79 (!) 144/69 (!) 115/56  Pulse: 92 93  97 (!) 103  Resp: 19 19 17 18   Temp:      TempSrc:      SpO2: 97% 97% 97% 96%  Weight:      Height:       Eyes: PERRL, lids and conjunctivae normal ENMT: Mucous membranes are moist.  Neck: normal, supple Respiratory: clear to auscultation bilaterally, no wheezing, no crackles. Normal respiratory effort. No accessory muscle use.  Cardiovascular: Regular rate and rhythm, no murmurs / rubs / gallops. No extremity edema.   Abdomen: Soft, no tenderness, no masses palpated. Bowel sounds positive.  Musculoskeletal: no clubbing / cyanosis. No joint deformity upper and lower extremities. Good ROM, no contractures. Normal muscle tone.  Skin: no rashes, lesions, ulcers. No induration Neurologic: CN 2-12 grossly intact. Sensation intact,  Strength 3/5 in lower extremity  psychiatric: . Alert and oriented x 4 but not able to provide history. Normal mood.    Labs on Admission: I have personally reviewed following labs and imaging studies  CBC: Recent Labs  Lab 01/07/21 1621 01/07/21 1646  WBC 16.6*  --   NEUTROABS 12.8*  --   HGB 10.2* 10.5*  HCT 31.5* 31.0*  MCV 114.5*  --   PLT 365  --    Basic Metabolic Panel: Recent Labs  Lab 01/07/21 1621 01/07/21 1646  NA 132* 134*  K 4.0 3.9  CL 99  --   CO2 24  --   GLUCOSE 175*  --   BUN 23  --   CREATININE 0.86  --   CALCIUM 9.4  --    GFR: Estimated Creatinine Clearance: 33.8 mL/min (by C-G formula based on SCr of 0.86 mg/dL). Liver Function Tests: Recent Labs  Lab 01/07/21 1621  AST 20  ALT 20  ALKPHOS 50  BILITOT 0.8  PROT 6.1*  ALBUMIN 3.1*   No results for input(s): LIPASE, AMYLASE in the last 168 hours. No results for input(s): AMMONIA in the last 168 hours. Coagulation Profile: No results for input(s): INR, PROTIME in the last 168 hours. Cardiac Enzymes: No results for input(s): CKTOTAL, CKMB, CKMBINDEX, TROPONINI in the last 168 hours. BNP (last 3 results) Recent Labs    01/25/20 1147 11/26/20 1339  PROBNP  103.0* 291   HbA1C: No results for input(s): HGBA1C in the last 72 hours. CBG: No results for input(s): GLUCAP in the last 168 hours. Lipid Profile: No results for input(s): CHOL, HDL, LDLCALC, TRIG, CHOLHDL, LDLDIRECT in the last 72 hours. Thyroid Function Tests: No results for input(s): TSH, T4TOTAL, FREET4, T3FREE, THYROIDAB in the last 72 hours. Anemia Panel: No results for input(s): VITAMINB12, FOLATE, FERRITIN, TIBC, IRON, RETICCTPCT in the last 72 hours. Urine analysis:    Component Value Date/Time   COLORURINE YELLOW 01/07/2021 1625   APPEARANCEUR CLOUDY (A) 01/07/2021 1625   LABSPEC 1.015 01/07/2021 1625   PHURINE 6.0 01/07/2021 1625   GLUCOSEU NEGATIVE 01/07/2021 1625   GLUCOSEU NEGATIVE 07/17/2020 0939   HGBUR SMALL (A) 01/07/2021 1625   BILIRUBINUR NEGATIVE 01/07/2021 1625   BILIRUBINUR 1+ 06/13/2019 1531   KETONESUR NEGATIVE 01/07/2021 1625   PROTEINUR 30 (A) 01/07/2021 1625   UROBILINOGEN 0.2 07/17/2020 0939   NITRITE  NEGATIVE 01/07/2021 1625   LEUKOCYTESUR LARGE (A) 01/07/2021 1625    Radiological Exams on Admission: No results found.    Assessment/Plan  Acute metabolic encephalopathy likely secondary to recurrent UTI -pt has hx of ESBL and is chronically on Macrobid but has not had positive culture for ESBL in the past 12 months. Discussed with pharmacy and will proceed with IV Rocephin for now pending urine culture -also will TSH, folic and vitamin P59  -Also noted to be taking past max dose of Tylenol with Norco. Check acetaminophen level.   Sepsis secondary to recurrent UTI -Patient presented with tachycardia and leukocytosis with positive UA -Continue IV antibiotic treatment as above -Continue IV fluid resuscitation  Chronic pain -continue home Norco. Resume gabapentin and Baclofen tomorrow and hold for tonight due to AMS  Type 2 diabetes Hb 1C of 5.6 in April/2022 -start on low dose SSI   Depression -continue cymbalta and  Elavil  PMR -continue low dose steroids 2mg  daily  Hypothyroidism - Check TSH -Continue levothyroxine  History of COPD/asthma/bronchiectasis - Continue home bronchodilator  Cognitive impairment -Granddaughter has noticed her having progressive worsening confusion in the past 6 months.  Last CT head in 04/2020 showed generalized cerebral volume loss and moderate chronic small vessel ischemic changes in the cerebral white matter. Given that she is already had some improvement with aggressive IV fluid resuscitation and antibiotics, suspect she probably has baseline dementia that could be worsening.  Advise continue follow-up with outpatient neurology for further testing   DVT prophylaxis:.Lovenox Code Status: DNR- confirmed with granddaughter and health aid Family Communication: Plan discussed with patient and home health aid at bedside. Also discussed and answered all question from granddaughter over the phone disposition Plan: Home with observation Consults called:  Admission status: Observation  Level of care: Telemetry Medical  Status is: Observation  The patient remains OBS appropriate and will d/c before 2 midnights.        Orene Desanctis DO [facility]   If 7PM-7AM, please contact night-coverage [email or web address]   01/07/2021, 9:02 PM

## 2021-01-07 NOTE — ED Triage Notes (Signed)
Pt bib ems from home; family called out initially for help getting pt to bedside commode; when ems checked vitals, HR 110, RR 30, end tidal 24, 98% RA, bp 150/78, cbg 206, unable to get temp; 1 g tylenol given at noon; hx chronic UTIs, on prophylactic abx; family endorses AMS x 24 hours; hx dementia, some minor confusion at baseline; pt denies pain currently

## 2021-01-07 NOTE — Progress Notes (Signed)
Sepsis tracking by eLINK 

## 2021-01-08 DIAGNOSIS — R532 Functional quadriplegia: Secondary | ICD-10-CM | POA: Diagnosis present

## 2021-01-08 DIAGNOSIS — Z794 Long term (current) use of insulin: Secondary | ICD-10-CM | POA: Diagnosis not present

## 2021-01-08 DIAGNOSIS — K6289 Other specified diseases of anus and rectum: Secondary | ICD-10-CM | POA: Diagnosis not present

## 2021-01-08 DIAGNOSIS — E1151 Type 2 diabetes mellitus with diabetic peripheral angiopathy without gangrene: Secondary | ICD-10-CM | POA: Diagnosis present

## 2021-01-08 DIAGNOSIS — Z20822 Contact with and (suspected) exposure to covid-19: Secondary | ICD-10-CM | POA: Diagnosis present

## 2021-01-08 DIAGNOSIS — D6489 Other specified anemias: Secondary | ICD-10-CM | POA: Diagnosis present

## 2021-01-08 DIAGNOSIS — Z9221 Personal history of antineoplastic chemotherapy: Secondary | ICD-10-CM | POA: Diagnosis not present

## 2021-01-08 DIAGNOSIS — E871 Hypo-osmolality and hyponatremia: Secondary | ICD-10-CM | POA: Diagnosis present

## 2021-01-08 DIAGNOSIS — M797 Fibromyalgia: Secondary | ICD-10-CM | POA: Diagnosis present

## 2021-01-08 DIAGNOSIS — I110 Hypertensive heart disease with heart failure: Secondary | ICD-10-CM | POA: Diagnosis present

## 2021-01-08 DIAGNOSIS — R109 Unspecified abdominal pain: Secondary | ICD-10-CM | POA: Diagnosis not present

## 2021-01-08 DIAGNOSIS — E785 Hyperlipidemia, unspecified: Secondary | ICD-10-CM | POA: Diagnosis present

## 2021-01-08 DIAGNOSIS — Z833 Family history of diabetes mellitus: Secondary | ICD-10-CM | POA: Diagnosis not present

## 2021-01-08 DIAGNOSIS — M353 Polymyalgia rheumatica: Secondary | ICD-10-CM | POA: Diagnosis present

## 2021-01-08 DIAGNOSIS — Z8249 Family history of ischemic heart disease and other diseases of the circulatory system: Secondary | ICD-10-CM | POA: Diagnosis not present

## 2021-01-08 DIAGNOSIS — E039 Hypothyroidism, unspecified: Secondary | ICD-10-CM | POA: Diagnosis present

## 2021-01-08 DIAGNOSIS — J449 Chronic obstructive pulmonary disease, unspecified: Secondary | ICD-10-CM | POA: Diagnosis present

## 2021-01-08 DIAGNOSIS — I5032 Chronic diastolic (congestive) heart failure: Secondary | ICD-10-CM | POA: Diagnosis present

## 2021-01-08 DIAGNOSIS — N3289 Other specified disorders of bladder: Secondary | ICD-10-CM | POA: Diagnosis not present

## 2021-01-08 DIAGNOSIS — Z66 Do not resuscitate: Secondary | ICD-10-CM | POA: Diagnosis present

## 2021-01-08 DIAGNOSIS — R4182 Altered mental status, unspecified: Secondary | ICD-10-CM | POA: Diagnosis not present

## 2021-01-08 DIAGNOSIS — K8689 Other specified diseases of pancreas: Secondary | ICD-10-CM | POA: Diagnosis not present

## 2021-01-08 DIAGNOSIS — M47816 Spondylosis without myelopathy or radiculopathy, lumbar region: Secondary | ICD-10-CM | POA: Diagnosis not present

## 2021-01-08 DIAGNOSIS — K5900 Constipation, unspecified: Secondary | ICD-10-CM | POA: Diagnosis not present

## 2021-01-08 DIAGNOSIS — Z90710 Acquired absence of both cervix and uterus: Secondary | ICD-10-CM | POA: Diagnosis not present

## 2021-01-08 DIAGNOSIS — A419 Sepsis, unspecified organism: Secondary | ICD-10-CM | POA: Diagnosis present

## 2021-01-08 DIAGNOSIS — F32A Depression, unspecified: Secondary | ICD-10-CM | POA: Diagnosis present

## 2021-01-08 DIAGNOSIS — Z801 Family history of malignant neoplasm of trachea, bronchus and lung: Secondary | ICD-10-CM | POA: Diagnosis not present

## 2021-01-08 DIAGNOSIS — G9341 Metabolic encephalopathy: Secondary | ICD-10-CM | POA: Diagnosis present

## 2021-01-08 DIAGNOSIS — N390 Urinary tract infection, site not specified: Secondary | ICD-10-CM | POA: Diagnosis present

## 2021-01-08 DIAGNOSIS — J4530 Mild persistent asthma, uncomplicated: Secondary | ICD-10-CM | POA: Diagnosis not present

## 2021-01-08 DIAGNOSIS — K219 Gastro-esophageal reflux disease without esophagitis: Secondary | ICD-10-CM | POA: Diagnosis present

## 2021-01-08 LAB — CBC
HCT: 29.2 % — ABNORMAL LOW (ref 36.0–46.0)
Hemoglobin: 9.4 g/dL — ABNORMAL LOW (ref 12.0–15.0)
MCH: 36.9 pg — ABNORMAL HIGH (ref 26.0–34.0)
MCHC: 32.2 g/dL (ref 30.0–36.0)
MCV: 114.5 fL — ABNORMAL HIGH (ref 80.0–100.0)
Platelets: 354 10*3/uL (ref 150–400)
RBC: 2.55 MIL/uL — ABNORMAL LOW (ref 3.87–5.11)
RDW: 13.2 % (ref 11.5–15.5)
WBC: 21.3 10*3/uL — ABNORMAL HIGH (ref 4.0–10.5)
nRBC: 0 % (ref 0.0–0.2)

## 2021-01-08 LAB — BASIC METABOLIC PANEL
Anion gap: 8 (ref 5–15)
BUN: 16 mg/dL (ref 8–23)
CO2: 24 mmol/L (ref 22–32)
Calcium: 9.1 mg/dL (ref 8.9–10.3)
Chloride: 100 mmol/L (ref 98–111)
Creatinine, Ser: 0.5 mg/dL (ref 0.44–1.00)
GFR, Estimated: >60 mL/min (ref >60)
Glucose, Bld: 52 mg/dL — ABNORMAL LOW (ref 70–99)
Potassium: 3.4 mmol/L — ABNORMAL LOW (ref 3.5–5.1)
Sodium: 132 mmol/L — ABNORMAL LOW (ref 135–145)

## 2021-01-08 LAB — RESP PANEL BY RT-PCR (FLU A&B, COVID) ARPGX2
Influenza A by PCR: NEGATIVE
Influenza B by PCR: NEGATIVE
SARS Coronavirus 2 by RT PCR: NEGATIVE

## 2021-01-08 LAB — VITAMIN B12: Vitamin B-12: 1297 pg/mL — ABNORMAL HIGH (ref 180–914)

## 2021-01-08 LAB — CBG MONITORING, ED
Glucose-Capillary: 127 mg/dL — ABNORMAL HIGH (ref 70–99)
Glucose-Capillary: 172 mg/dL — ABNORMAL HIGH (ref 70–99)

## 2021-01-08 LAB — TSH: TSH: 1.342 u[IU]/mL (ref 0.350–4.500)

## 2021-01-08 LAB — GLUCOSE, CAPILLARY
Glucose-Capillary: 201 mg/dL — ABNORMAL HIGH (ref 70–99)
Glucose-Capillary: 158 mg/dL — ABNORMAL HIGH (ref 70–99)

## 2021-01-08 LAB — FOLATE: Folate: 25.6 ng/mL (ref >5.9)

## 2021-01-08 MED ORDER — POLYETHYLENE GLYCOL 3350 17 G PO PACK
17.0000 g | PACK | Freq: Every day | ORAL | Status: DC
  Administered 2021-01-08 – 2021-01-09 (×2): 17 g via ORAL
  Filled 2021-01-08 (×4): qty 1

## 2021-01-08 MED ORDER — SODIUM CHLORIDE 0.9 % IV SOLN
2.0000 g | INTRAVENOUS | Status: DC
  Administered 2021-01-08 – 2021-01-12 (×5): 2 g via INTRAVENOUS
  Filled 2021-01-08 (×5): qty 20

## 2021-01-08 MED ORDER — ONDANSETRON HCL 4 MG/2ML IJ SOLN
4.0000 mg | Freq: Four times a day (QID) | INTRAMUSCULAR | Status: DC | PRN
  Administered 2021-01-08: 4 mg via INTRAVENOUS
  Filled 2021-01-08: qty 2

## 2021-01-08 MED ORDER — POTASSIUM CHLORIDE CRYS ER 20 MEQ PO TBCR
40.0000 meq | EXTENDED_RELEASE_TABLET | Freq: Once | ORAL | Status: AC
  Administered 2021-01-08: 40 meq via ORAL
  Filled 2021-01-08: qty 2

## 2021-01-08 MED ORDER — SENNOSIDES-DOCUSATE SODIUM 8.6-50 MG PO TABS
1.0000 | ORAL_TABLET | Freq: Every day | ORAL | Status: DC
  Administered 2021-01-08 – 2021-01-10 (×2): 1 via ORAL
  Filled 2021-01-08 (×2): qty 1

## 2021-01-08 MED ORDER — LACTATED RINGERS IV SOLN: INTRAVENOUS | Status: AC

## 2021-01-08 MED ORDER — BISACODYL 10 MG RE SUPP: 10.0000 mg | Freq: Every day | RECTAL | Status: DC | PRN

## 2021-01-08 NOTE — Progress Notes (Signed)
Patient arrived to [phone] via stretcher/ED Staff.

## 2021-01-08 NOTE — Progress Notes (Signed)
PROGRESS NOTE        PATIENT DETAILS Name: Kayla Thompson Age: 85 y.o. Sex: female Date of Birth: Feb 25, 1936 Admit Date: 01/07/2021 Admitting Physician Orene Desanctis, DO GMW:[id number], Real Cons, MD  Brief Narrative: Patient is a 85 y.o. female with history of HFpEF, HTN, PAD, frequent UTIs on suppressive therapy with Macrobid, PMR on chronic steroids, COPD/asthma, DM-2, HLD-who presented with altered mental status-found to have sepsis physiology due to UTI.  See below for further details.  Subjective: Looks frail/weak-however seems to be much more awake and alert compared to yesterday.  Caregiver at bedside-acknowledges significant improvement in mentation.  Objective: Vitals: Blood pressure (!) 140/109, pulse (!) 113, temperature 97.9 F (36.6 C), temperature source Oral, resp. rate (!) 21, height 4\' 11"  (1.499 m), weight 48.3 kg, SpO2 96 %.   Exam: Gen Exam:Alert awake-not in any distress HEENT:atraumatic, normocephalic Chest: B/L clear to auscultation anteriorly CVS:S1S2 regular Abdomen:soft non tender, non distended Extremities:no edema Neurology: Non focal-but with significant generalized weakness. Skin: no rash  Pertinent Labs/Radiology: WBC: 21.3 Hb: 9.4 Na: 132 K: 3.4 Creatinine: 0.50  10/20>>Blood culture: Pending 10/19>>Urine Culture: Pending  Assessment/Plan: Sepsis due to complicated UTI: Sepsis physiology rapidly improving-continue IV antibiotics-await blood/urine cultures.  Acute metabolic encephalopathy: Due to sepsis/UTI-improved-suspect close to baseline.  Hypothyroidism: Continue levothyroxine-TSH stable.  Polymyalgia rheumatica: Stable-continue usual regimen of prednisone.  COPD/asthma: Stable-not in exacerbation-continue bronchodilators.  DM-2 (A1c 6.4 on 9/29): CBG stable on SSI.  Follow and adjust.  Recent Labs    01/07/21 2258 01/08/21 0752  GLUCAP 230* 127*     Depression: Appears stable-continue  Cymbalta/Elavil  Chronic pain: Resume gabapentin/baclofen.  Debility/deconditioning/functional quadriplegia: Walks with the help of a walker at baseline-worsening debility likely due to acute illness-obtaining PT/OT eval.  Procedures: None Consults: None DVT Prophylaxis: Lovenox Code Status:Full code  Family Communication: Daughter Rhonda-[phone]-left voicemail on 10/20  Time spent: 35 minutes-Greater than 50% of this time was spent in counseling, explanation of diagnosis, planning of further management, and coordination of care.  Diet: Diet Order             Diet Heart Room service appropriate? Yes; Fluid consistency: Thin  Diet effective now                      Disposition Plan: Status is: Observation  The patient will require care spanning > 2 midnights and should be moved to inpatient because: Inpatient level of care due to requiring IV therapies.   Barriers to Discharge: Resolving sepsis physiology/encephalopathy from UTI-significantly weaker than baseline-needs continued IV antibiotics-and inpatient PT/OT eval for safe discharge.  Antimicrobial agents: Anti-infectives (From admission, onward)    Start     Dose/Rate Route Frequency Ordered Stop   01/08/21 1000  cefTRIAXone (ROCEPHIN) 1 g in sodium chloride 0.9 % 100 mL IVPB  Status:  Discontinued        1 g 200 mL/hr over 30 Minutes Intravenous Every 24 hours 01/07/21 2144 01/08/21 0736   01/08/21 0800  cefTRIAXone (ROCEPHIN) 2 g in sodium chloride 0.9 % 100 mL IVPB        2 g 200 mL/hr over 30 Minutes Intravenous Every 24 hours 01/08/21 0736     01/07/21 2000  cefTRIAXone (ROCEPHIN) 1 g in sodium chloride 0.9 % 100 mL IVPB        1 g  200 mL/hr over 30 Minutes Intravenous  Once 01/07/21 1947 01/07/21 2049        MEDICATIONS: Scheduled Meds:  amitriptyline  10 mg Oral QHS   vitamin C  1,000 mg Oral BID   baclofen  10 mg Oral QPM   calcium carbonate  625 mg Oral BID WC   cholecalciferol  2,000  Units Oral q AM   DULoxetine  90 mg Oral Daily   enoxaparin (LOVENOX) injection  40 mg Subcutaneous Q24H   feeding supplement  237 mL Oral TID BM   fluticasone furoate-vilanterol  1 puff Inhalation Daily   gabapentin  100 mg Oral QHS   insulin aspart  0-9 Units Subcutaneous TID WC   ipratropium-albuterol  3 mL Nebulization TID   levothyroxine  50 mcg Oral QAC breakfast   montelukast  10 mg Oral QPM   polyethylene glycol  17 g Oral Daily   predniSONE  2 mg Oral Q breakfast   rosuvastatin  10 mg Oral QPM   senna-docusate  1 tablet Oral QHS   umeclidinium bromide  1 puff Inhalation Daily   Continuous Infusions:  cefTRIAXone (ROCEPHIN)  IV Stopped (01/08/21 0939)   lactated ringers 75 mL/hr at 01/08/21 0756   PRN Meds:.albuterol, bisacodyl, oxyCODONE-acetaminophen   I have personally reviewed following labs and imaging studies  LABORATORY DATA: CBC: Recent Labs  Lab 01/07/21 1621 01/07/21 1646 01/08/21 0353  WBC 16.6*  --  21.3*  NEUTROABS 12.8*  --   --   HGB 10.2* 10.5* 9.4*  HCT 31.5* 31.0* 29.2*  MCV 114.5*  --  114.5*  PLT 365  --  259    Basic Metabolic Panel: Recent Labs  Lab 01/07/21 1621 01/07/21 1646 01/08/21 0353  NA 132* 134* 132*  K 4.0 3.9 3.4*  CL 99  --  100  CO2 24  --  24  GLUCOSE 175*  --  52*  BUN 23  --  16  CREATININE 0.86  --  0.50  CALCIUM 9.4  --  9.1    GFR: Estimated Creatinine Clearance: 36.3 mL/min (by C-G formula based on SCr of 0.5 mg/dL).  Liver Function Tests: Recent Labs  Lab 01/07/21 1621  AST 20  ALT 20  ALKPHOS 50  BILITOT 0.8  PROT 6.1*  ALBUMIN 3.1*   No results for input(s): LIPASE, AMYLASE in the last 168 hours. No results for input(s): AMMONIA in the last 168 hours.  Coagulation Profile: No results for input(s): INR, PROTIME in the last 168 hours.  Cardiac Enzymes: No results for input(s): CKTOTAL, CKMB, CKMBINDEX, TROPONINI in the last 168 hours.  BNP (last 3 results) Recent Labs    01/25/20 1147  11/26/20 1339  PROBNP 103.0* 291    Lipid Profile: No results for input(s): CHOL, HDL, LDLCALC, TRIG, CHOLHDL, LDLDIRECT in the last 72 hours.  Thyroid Function Tests: Recent Labs    01/08/21 0354  TSH 1.342    Anemia Panel: Recent Labs    01/08/21 0353 01/08/21 0354  VITAMINB12 1,297*  --   FOLATE  --  25.6    Urine analysis:    Component Value Date/Time   COLORURINE YELLOW 01/07/2021 1625   APPEARANCEUR CLOUDY (A) 01/07/2021 1625   LABSPEC 1.015 01/07/2021 1625   PHURINE 6.0 01/07/2021 1625   GLUCOSEU NEGATIVE 01/07/2021 1625   GLUCOSEU NEGATIVE 07/17/2020 0939   HGBUR SMALL (A) 01/07/2021 1625   BILIRUBINUR NEGATIVE 01/07/2021 1625   BILIRUBINUR 1+ 06/13/2019 1531   [place] 01/07/2021 1625  PROTEINUR 30 (A) 01/07/2021 1625   UROBILINOGEN 0.2 07/17/2020 0939   NITRITE NEGATIVE 01/07/2021 1625   LEUKOCYTESUR LARGE (A) 01/07/2021 1625    Sepsis Labs: Lactic Acid, Venous    Component Value Date/Time   LATICACIDVEN 0.8 01/07/2021 1831    MICROBIOLOGY: Recent Results (from the past 240 hour(s))  Resp Panel by RT-PCR (Flu A&B, Covid) Nasopharyngeal Swab     Status: None   Collection Time: 01/07/21 11:36 PM   Specimen: Nasopharyngeal Swab; Nasopharyngeal(NP) swabs in vial transport medium  Result Value Ref Range Status   SARS Coronavirus 2 by RT PCR NEGATIVE NEGATIVE Final    Comment: (NOTE) SARS-CoV-2 target nucleic acids are NOT DETECTED.  The SARS-CoV-2 RNA is generally detectable in upper respiratory specimens during the acute phase of infection. The lowest concentration of SARS-CoV-2 viral copies this assay can detect is 138 copies/mL. A negative result does not preclude SARS-Cov-2 infection and should not be used as the sole basis for treatment or other patient management decisions. A negative result may occur with  improper specimen collection/handling, submission of specimen other than nasopharyngeal swab, presence of viral mutation(s)  within the areas targeted by this assay, and inadequate number of viral copies(<138 copies/mL). A negative result must be combined with clinical observations, patient history, and epidemiological information. The expected result is Negative.  Fact Sheet for Patients:  EntrepreneurPulse.com.au  Fact Sheet for Healthcare Providers:  IncredibleEmployment.be  This test is no t yet approved or cleared by the [place] FDA and  has been authorized for detection and/or diagnosis of SARS-CoV-2 by FDA under an Emergency Use Authorization (EUA). This EUA will remain  in effect (meaning this test can be used) for the duration of the COVID-19 declaration under Section 564(b)(1) of the Act, 21 U.S.C.section 360bbb-3(b)(1), unless the authorization is terminated  or revoked sooner.       Influenza A by PCR NEGATIVE NEGATIVE Final   Influenza B by PCR NEGATIVE NEGATIVE Final    Comment: (NOTE) The Xpert Xpress SARS-CoV-2/FLU/RSV plus assay is intended as an aid in the diagnosis of influenza from Nasopharyngeal swab specimens and should not be used as a sole basis for treatment. Nasal washings and aspirates are unacceptable for Xpert Xpress SARS-CoV-2/FLU/RSV testing.  Fact Sheet for Patients: EntrepreneurPulse.com.au  Fact Sheet for Healthcare Providers: IncredibleEmployment.be  This test is not yet approved or cleared by the [place] FDA and has been authorized for detection and/or diagnosis of SARS-CoV-2 by FDA under an Emergency Use Authorization (EUA). This EUA will remain in effect (meaning this test can be used) for the duration of the COVID-19 declaration under Section 564(b)(1) of the Act, 21 U.S.C. section 360bbb-3(b)(1), unless the authorization is terminated or revoked.  Performed at [facility], [place] [address], [place], [place] [address]     RADIOLOGY STUDIES/RESULTS: No results  found.   LOS: 0 days   Oren Binet, MD  [facility]    To contact the attending provider between 7A-7P or the covering provider during after hours 7P-7A, please log into the web site [email or web address] and access using universal [place] password for that web site. If you do not have the password, please call the hospital operator.  01/08/2021, 11:56 AM

## 2021-01-09 DIAGNOSIS — G9341 Metabolic encephalopathy: Secondary | ICD-10-CM | POA: Diagnosis not present

## 2021-01-09 DIAGNOSIS — E039 Hypothyroidism, unspecified: Secondary | ICD-10-CM | POA: Diagnosis not present

## 2021-01-09 DIAGNOSIS — N390 Urinary tract infection, site not specified: Secondary | ICD-10-CM | POA: Diagnosis not present

## 2021-01-09 DIAGNOSIS — M353 Polymyalgia rheumatica: Secondary | ICD-10-CM | POA: Diagnosis not present

## 2021-01-09 LAB — GLUCOSE, CAPILLARY
Glucose-Capillary: 121 mg/dL — ABNORMAL HIGH (ref 70–99)
Glucose-Capillary: 170 mg/dL — ABNORMAL HIGH (ref 70–99)
Glucose-Capillary: 131 mg/dL — ABNORMAL HIGH (ref 70–99)
Glucose-Capillary: 270 mg/dL — ABNORMAL HIGH (ref 70–99)

## 2021-01-09 LAB — BASIC METABOLIC PANEL
Anion gap: 8 (ref 5–15)
BUN: 11 mg/dL (ref 8–23)
CO2: 25 mmol/L (ref 22–32)
Calcium: 9.2 mg/dL (ref 8.9–10.3)
Chloride: 100 mmol/L (ref 98–111)
Creatinine, Ser: 0.78 mg/dL (ref 0.44–1.00)
GFR, Estimated: >60 mL/min (ref >60)
Glucose, Bld: 209 mg/dL — ABNORMAL HIGH (ref 70–99)
Potassium: 3.8 mmol/L (ref 3.5–5.1)
Sodium: 133 mmol/L — ABNORMAL LOW (ref 135–145)

## 2021-01-09 LAB — URINE CULTURE: Culture: <10000 — AB

## 2021-01-09 LAB — CBC
HCT: 28.2 % — ABNORMAL LOW (ref 36.0–46.0)
Hemoglobin: 9.3 g/dL — ABNORMAL LOW (ref 12.0–15.0)
MCH: 36.9 pg — ABNORMAL HIGH (ref 26.0–34.0)
MCHC: 33 g/dL (ref 30.0–36.0)
MCV: 111.9 fL — ABNORMAL HIGH (ref 80.0–100.0)
Platelets: 284 10*3/uL (ref 150–400)
RBC: 2.52 MIL/uL — ABNORMAL LOW (ref 3.87–5.11)
RDW: 13 % (ref 11.5–15.5)
WBC: 19.1 10*3/uL — ABNORMAL HIGH (ref 4.0–10.5)
nRBC: 0 % (ref 0.0–0.2)

## 2021-01-09 LAB — C DIFFICILE QUICK SCREEN W PCR REFLEX
C Diff antigen: NEGATIVE
C Diff interpretation: NOT DETECTED
C Diff toxin: NEGATIVE

## 2021-01-09 MED ORDER — LOPERAMIDE HCL 2 MG PO CAPS
2.0000 mg | ORAL_CAPSULE | ORAL | Status: DC | PRN
  Administered 2021-01-09: 2 mg via ORAL
  Filled 2021-01-09: qty 1

## 2021-01-09 MED ORDER — GERHARDT'S BUTT CREAM
TOPICAL_CREAM | Freq: Four times a day (QID) | CUTANEOUS | Status: DC
  Filled 2021-01-09 (×2): qty 1

## 2021-01-09 MED ORDER — ENOXAPARIN SODIUM 30 MG/0.3ML IJ SOSY
30.0000 mg | PREFILLED_SYRINGE | INTRAMUSCULAR | Status: DC
  Administered 2021-01-09 – 2021-01-11 (×3): 30 mg via SUBCUTANEOUS
  Filled 2021-01-09 (×3): qty 0.3

## 2021-01-09 NOTE — Evaluation (Signed)
Physical Therapy Evaluation Patient Details Name: Kayla Thompson MRN: [id number] DOB: 1935-05-03 Today's Date: 01/09/2021  History of Present Illness  85 yo female with onset of AMS was brought to hosp to admit 10/19.  Overnight was confused, N&V, and had dysuria at some point.  Noted acute metabolic encephalopathy, sepsis.  PMHx:  CHF, PAD, HTN, UTI, PMR on steroids, asthma, granulomatous lung disease, COPD, hypothyroidism, DM, GERD, HLD, cognitive impairment.  Clinical Impression  Pt was seen for mobility eval but initially had to do some bathing and cleaning for very loose bowel movement.  Pt is motivated to try and did get to side of bed to practice sitting balance control.  However, her effort to move was hindered by fatigue and weakness after the rolling and work to get cleaned up.  Pt has PLOF with gait with assist on RW and caregiver assist to transfer OOB, so per the pt and caregiver request, will work toward dc to home.  Pt may need to be reconsidered for SNF care if the process of recovery in hosp is not sufficient to get her home.  Follow for acute PT goals as outlined below.  MONITOR O2 SATS while moving on her telemetry readout.         Recommendations for follow up therapy are one component of a multi-disciplinary discharge planning process, led by the attending physician.  Recommendations may be updated based on patient status, additional functional criteria and insurance authorization.  Follow Up Recommendations Home health PT;Supervision for mobility/OOB;Supervision/Assistance - 24 hour    Equipment Recommendations  None recommended by PT    Recommendations for Other Services       Precautions / Restrictions Precautions Precautions: Fall Precaution Comments: lethargic and weak, MONITOR SATS while moving Restrictions Weight Bearing Restrictions: No Other Position/Activity Restrictions: erythema on peri area      Mobility  Bed Mobility Overal bed mobility: Needs  Assistance Bed Mobility: Supine to Sit;Sit to Supine;Rolling Rolling: Mod assist   Supine to sit: Mod assist;Max assist Sit to supine: Max assist   General bed mobility comments: pt is more practiced with rollling after cleaning up watery bowel movement, but desaturates with R sidelying    Transfers Overall transfer level: Needs assistance               General transfer comment: pt declined to try  Ambulation/Gait                Stairs            Wheelchair Mobility    Modified Rankin (Stroke Patients Only)       Balance Overall balance assessment: Needs assistance Sitting-balance support: Feet supported;Bilateral upper extremity supported Sitting balance-Leahy Scale: Poor Sitting balance - Comments: poor balance with posterior lean due to fatigue and weakness Postural control: Posterior lean                                   Pertinent Vitals/Pain Pain Assessment: Faces Faces Pain Scale: Hurts little more Pain Location: peri area with cleaning loose bowel movement, due to skin maceration Pain Descriptors / Indicators: Grimacing;Guarding;Aching Pain Intervention(s): Monitored during session;Repositioned    Home Living Family/patient expects to be discharged to:: Private residence Living Arrangements: Alone Available Help at Discharge: Family;Personal care attendant;Available 24 hours/day Type of Home: House Home Access: Stairs to enter Entrance Stairs-Rails: None Entrance Stairs-Number of Steps: 1 full step to a patio, then  threshold Home Layout: One level Home Equipment: Walker - 2 wheels;Bedside commode;Shower seat;Walker - 4 wheels;Hand held shower head;Grab bars - tub/shower;Wheelchair - manual Additional Comments: Rollator inside, RW outside    Prior Function Level of Independence: Needs assistance   Gait / Transfers Assistance Needed: transfers assisted and used RW  ADL's / Homemaking Assistance Needed: Pt has 24/7 help  for house and her own care, all equipment in place.        Hand Dominance   Dominant Hand: Right    Extremity/Trunk Assessment                Communication   Communication: No difficulties  Cognition Arousal/Alertness: Lethargic Behavior During Therapy: Flat affect Overall Cognitive Status: Impaired/Different from baseline                   Orientation Level: Place;Time;Situation Current Attention Level: Selective Memory: Decreased short-term memory Following Commands: Follows one step commands inconsistently;Follows one step commands with increased time   Awareness: Intellectual Problem Solving: Slow processing;Requires verbal cues;Requires tactile cues;Decreased initiation General Comments: pt has both weakness and discomfort along with new symptoms that make initiating rolling difficult      General Comments General comments (skin integrity, edema, etc.): Pt was not energetic enough to try to stand, but did make the effort to let PT see how she sits.  Has a sitter in her room with experience caring for pt, and has a good working relationship with her    Exercises     Assessment/Plan    PT Assessment Patient needs continued PT services  PT Problem List Decreased strength;Decreased activity tolerance;Decreased balance;Decreased mobility;Decreased coordination;Decreased range of motion;Decreased knowledge of use of DME;Decreased safety awareness;Cardiopulmonary status limiting activity;Decreased skin integrity;Pain       PT Treatment Interventions DME instruction;Gait training;Stair training;Functional mobility training;Therapeutic activities;Therapeutic exercise;Balance training;Neuromuscular re-education;Patient/family education    PT Goals (Current goals can be found in the Care Plan section)  Acute Rehab PT Goals Patient Stated Goal: to go home with her caregiver PT Goal Formulation: With patient Time For Goal Achievement: 01/23/21 Potential to  Achieve Goals: Fair    Frequency Min 3X/week   Barriers to discharge Inaccessible home environment;Decreased caregiver support home with one sitter and steps to enter    Co-evaluation               AM-PAC PT "6 Clicks" Mobility  Outcome Measure Help needed turning from your back to your side while in a flat bed without using bedrails?: A Lot Help needed moving from lying on your back to sitting on the side of a flat bed without using bedrails?: A Lot Help needed moving to and from a bed to a chair (including a wheelchair)?: A Lot Help needed standing up from a chair using your arms (e.g., wheelchair or bedside chair)?: Total Help needed to walk in hospital room?: Total Help needed climbing 3-5 steps with a railing? : Total 6 Click Score: 9    End of Session Equipment Utilized During Treatment: Gait belt Activity Tolerance: Patient limited by fatigue;Treatment limited secondary to medical complications (Comment) Patient left: in bed;with call bell/phone within reach;with bed alarm set;with nursing/sitter in room   PT Visit Diagnosis: Muscle weakness (generalized) (M62.81);Adult, failure to thrive (R62.7);Pain;Difficulty in walking, not elsewhere classified (R26.2) Pain - Right/Left:  (peri area) Pain - part of body:  (peri area)    Time: 7616-0737 PT Time Calculation (min) (ACUTE ONLY): 30 min   Charges:   PT Evaluation $  PT Eval Moderate Complexity: 1 Mod PT Treatments $Therapeutic Activity: 8-22 mins       Ramond Dial 01/09/2021, 5:21 PM  Mee Hives, PT PhD Acute Rehab Dept. Number: [place] [facility] [place]

## 2021-01-09 NOTE — Progress Notes (Signed)
PROGRESS NOTE        PATIENT DETAILS Name: Kayla Thompson Age: 85 y.o. Sex: female Date of Birth: [birth year] Admit Date: 01/07/2021 Admitting Physician Evalee Mutton Kristeen Mans, MD PIR:JJOACZYS, Real Cons, MD  Brief Narrative: Patient is a 85 y.o. female with history of HFpEF, HTN, PAD, frequent UTIs on suppressive therapy with Macrobid, PMR on chronic steroids, COPD/asthma, DM-2, HLD-who presented with altered mental status-found to have sepsis physiology due to UTI.  See below for further details.  Subjective: Seen earlier this morning-had just woken up-caregiver at bedside-looks frail-but seems to be much more awake and alert compared to yesterday.  Still complaining of some dysuria.  Objective: Vitals: Blood pressure 137/69, pulse 91, temperature 98 F (36.7 C), resp. rate 20, height 4\' 11"  (1.499 m), weight 48.3 kg, SpO2 97 %.   Exam: Gen Exam:Alert awake-not in any distress HEENT:atraumatic, normocephalic Chest: B/L clear to auscultation anteriorly CVS:S1S2 regular Abdomen:soft non tender, non distended Extremities:no edema Neurology: Non focal-has generalized weakness. Skin: no rash   Pertinent Labs/Radiology: WBC: 21.3>>19.0 Hb: 9.3 Na: 133 K: 3.8 Creatinine: 0.78  10/19>>Urine Culture: Insignificant growth 10/20>>Blood culture: No growth  Assessment/Plan: Sepsis due to complicated UTI: Sepsis physiology has resolved-leukocytosis downtrending-cultures negative so far-remains on IV Rocephin.  Although she has a history of ESBL E. coli infection-she is clinically improving with IV Rocephin-hence felt okay to continue with IV Rocephin for now.  Acute metabolic encephalopathy: Due to sepsis/UTI-improved-suspect close to baseline.  Continue supportive care.   Hypothyroidism: Continue levothyroxine-TSH stable.  Polymyalgia rheumatica: Stable-continue usual regimen of prednisone.  COPD/asthma: Stable-not in exacerbation-continue  bronchodilators.  DM-2 (A1c 6.4 on 9/29): CBG stable on SSI.  Follow and adjust.  Recent Labs    01/08/21 2049 01/09/21 0755 01/09/21 1159  GLUCAP 158* 121* 170*     Depression: Appears stable-continue Cymbalta/Elavil  Chronic pain: Resume gabapentin/baclofen.  Debility/deconditioning/functional quadriplegia: Walks with the help of a walker at baseline-worsening debility likely due to acute illness-obtaining PT/OT eval.  Procedures: None Consults: None DVT Prophylaxis: Lovenox Code Status:Full code  Family Communication: Daughter Rhonda-[phone]-updated on 10/21  Time spent: 4 minutes-Greater than 50% of this time was spent in counseling, explanation of diagnosis, planning of further management, and coordination of care.  Diet: Diet Order             Diet Heart Room service appropriate? Yes; Fluid consistency: Thin  Diet effective now                      Disposition Plan: Status is: Observation  The patient will require care spanning > 2 midnights and should be moved to inpatient because: Inpatient level of care due to requiring IV therapies.   Barriers to Discharge: Resolving sepsis physiology/encephalopathy from UTI-significantly weaker than baseline-needs continued IV antibiotics-and inpatient PT/OT eval for safe discharge.  Antimicrobial agents: Anti-infectives (From admission, onward)    Start     Dose/Rate Route Frequency Ordered Stop   01/08/21 1000  cefTRIAXone (ROCEPHIN) 1 g in sodium chloride 0.9 % 100 mL IVPB  Status:  Discontinued        1 g 200 mL/hr over 30 Minutes Intravenous Every 24 hours 01/07/21 2144 01/08/21 0736   01/08/21 0800  cefTRIAXone (ROCEPHIN) 2 g in sodium chloride 0.9 % 100 mL IVPB        2 g 200 mL/hr  over 30 Minutes Intravenous Every 24 hours 01/08/21 0736     01/07/21 2000  cefTRIAXone (ROCEPHIN) 1 g in sodium chloride 0.9 % 100 mL IVPB        1 g 200 mL/hr over 30 Minutes Intravenous  Once 01/07/21 1947 01/07/21  2049        MEDICATIONS: Scheduled Meds:  amitriptyline  10 mg Oral QHS   vitamin C  1,000 mg Oral BID   baclofen  10 mg Oral QPM   calcium carbonate  625 mg Oral BID WC   cholecalciferol  2,000 Units Oral q AM   DULoxetine  90 mg Oral Daily   enoxaparin (LOVENOX) injection  30 mg Subcutaneous Q24H   feeding supplement  237 mL Oral TID BM   fluticasone furoate-vilanterol  1 puff Inhalation Daily   gabapentin  100 mg Oral QHS   insulin aspart  0-9 Units Subcutaneous TID WC   levothyroxine  50 mcg Oral QAC breakfast   montelukast  10 mg Oral QPM   polyethylene glycol  17 g Oral Daily   predniSONE  2 mg Oral Q breakfast   rosuvastatin  10 mg Oral QPM   senna-docusate  1 tablet Oral QHS   umeclidinium bromide  1 puff Inhalation Daily   Continuous Infusions:  cefTRIAXone (ROCEPHIN)  IV 2 g (01/09/21 0945)   PRN Meds:.albuterol, bisacodyl, ondansetron (ZOFRAN) IV, oxyCODONE-acetaminophen   I have personally reviewed following labs and imaging studies  LABORATORY DATA: CBC: Recent Labs  Lab 01/07/21 1621 01/07/21 1646 01/08/21 0353 01/09/21 0124  WBC 16.6*  --  21.3* 19.1*  NEUTROABS 12.8*  --   --   --   HGB 10.2* 10.5* 9.4* 9.3*  HCT 31.5* 31.0* 29.2* 28.2*  MCV 114.5*  --  114.5* 111.9*  PLT 365  --  354 284     Basic Metabolic Panel: Recent Labs  Lab 01/07/21 1621 01/07/21 1646 01/08/21 0353 01/09/21 0124  NA 132* 134* 132* 133*  K 4.0 3.9 3.4* 3.8  CL 99  --  100 100  CO2 24  --  24 25  GLUCOSE 175*  --  52* 209*  BUN 23  --  16 11  CREATININE 0.86  --  0.50 0.78  CALCIUM 9.4  --  9.1 9.2     GFR: Estimated Creatinine Clearance: 36.3 mL/min (by C-G formula based on SCr of 0.78 mg/dL).  Liver Function Tests: Recent Labs  Lab 01/07/21 1621  AST 20  ALT 20  ALKPHOS 50  BILITOT 0.8  PROT 6.1*  ALBUMIN 3.1*    No results for input(s): LIPASE, AMYLASE in the last 168 hours. No results for input(s): AMMONIA in the last 168  hours.  Coagulation Profile: No results for input(s): INR, PROTIME in the last 168 hours.  Cardiac Enzymes: No results for input(s): CKTOTAL, CKMB, CKMBINDEX, TROPONINI in the last 168 hours.  BNP (last 3 results) Recent Labs    01/25/20 1147 11/26/20 1339  PROBNP 103.0* 291     Lipid Profile: No results for input(s): CHOL, HDL, LDLCALC, TRIG, CHOLHDL, LDLDIRECT in the last 72 hours.  Thyroid Function Tests: Recent Labs    01/08/21 0354  TSH 1.342     Anemia Panel: Recent Labs    01/08/21 0353 01/08/21 0354  VITAMINB12 1,297*  --   FOLATE  --  25.6     Urine analysis:    Component Value Date/Time   COLORURINE YELLOW 01/07/2021 1625   APPEARANCEUR CLOUDY (A) 01/07/2021  1625   LABSPEC 1.015 01/07/2021 1625   PHURINE 6.0 01/07/2021 1625   GLUCOSEU NEGATIVE 01/07/2021 1625   GLUCOSEU NEGATIVE 07/17/2020 0939   HGBUR SMALL (A) 01/07/2021 1625   BILIRUBINUR NEGATIVE 01/07/2021 1625   BILIRUBINUR 1+ 06/13/2019 1531   KETONESUR NEGATIVE 01/07/2021 1625   PROTEINUR 30 (A) 01/07/2021 1625   UROBILINOGEN 0.2 07/17/2020 0939   NITRITE NEGATIVE 01/07/2021 1625   LEUKOCYTESUR LARGE (A) 01/07/2021 1625    Sepsis Labs: Lactic Acid, Venous    Component Value Date/Time   LATICACIDVEN 0.8 01/07/2021 1831    MICROBIOLOGY: Recent Results (from the past 240 hour(s))  Urine Culture     Status: Abnormal   Collection Time: 01/07/21  7:48 PM   Specimen: Urine, Clean Catch  Result Value Ref Range Status   Specimen Description URINE, CLEAN CATCH  Final   Special Requests NONE  Final   Culture (A)  Final    <10,000 COLONIES/mL INSIGNIFICANT GROWTH Performed at [facility], [place] [address], [place], [place] [address]    Report Status 01/09/2021 FINAL  Final  Resp Panel by RT-PCR (Flu A&B, Covid) Nasopharyngeal Swab     Status: None   Collection Time: 01/07/21 11:36 PM   Specimen: Nasopharyngeal Swab; Nasopharyngeal(NP) swabs in vial transport medium  Result  Value Ref Range Status   SARS Coronavirus 2 by RT PCR NEGATIVE NEGATIVE Final    Comment: (NOTE) SARS-CoV-2 target nucleic acids are NOT DETECTED.  The SARS-CoV-2 RNA is generally detectable in upper respiratory specimens during the acute phase of infection. The lowest concentration of SARS-CoV-2 viral copies this assay can detect is 138 copies/mL. A negative result does not preclude SARS-Cov-2 infection and should not be used as the sole basis for treatment or other patient management decisions. A negative result may occur with  improper specimen collection/handling, submission of specimen other than nasopharyngeal swab, presence of viral mutation(s) within the areas targeted by this assay, and inadequate number of viral copies(<138 copies/mL). A negative result must be combined with clinical observations, patient history, and epidemiological information. The expected result is Negative.  Fact Sheet for Patients:  EntrepreneurPulse.com.au  Fact Sheet for Healthcare Providers:  IncredibleEmployment.be  This test is no t yet approved or cleared by the [place] FDA and  has been authorized for detection and/or diagnosis of SARS-CoV-2 by FDA under an Emergency Use Authorization (EUA). This EUA will remain  in effect (meaning this test can be used) for the duration of the COVID-19 declaration under Section 564(b)(1) of the Act, 21 U.S.C.section 360bbb-3(b)(1), unless the authorization is terminated  or revoked sooner.       Influenza A by PCR NEGATIVE NEGATIVE Final   Influenza B by PCR NEGATIVE NEGATIVE Final    Comment: (NOTE) The Xpert Xpress SARS-CoV-2/FLU/RSV plus assay is intended as an aid in the diagnosis of influenza from Nasopharyngeal swab specimens and should not be used as a sole basis for treatment. Nasal washings and aspirates are unacceptable for Xpert Xpress SARS-CoV-2/FLU/RSV testing.  Fact Sheet for  Patients: EntrepreneurPulse.com.au  Fact Sheet for Healthcare Providers: IncredibleEmployment.be  This test is not yet approved or cleared by the [place] FDA and has been authorized for detection and/or diagnosis of SARS-CoV-2 by FDA under an Emergency Use Authorization (EUA). This EUA will remain in effect (meaning this test can be used) for the duration of the COVID-19 declaration under Section 564(b)(1) of the Act, 21 U.S.C. section 360bbb-3(b)(1), unless the authorization is terminated or revoked.  Performed  at [facility], [place] [address], [place], [place] [address]   Culture, blood (routine x 2)     Status: None (Preliminary result)   Collection Time: 01/08/21  8:30 AM   Specimen: BLOOD RIGHT ARM  Result Value Ref Range Status   Specimen Description BLOOD RIGHT ARM  Final   Special Requests   Final    BOTTLES DRAWN AEROBIC AND ANAEROBIC Blood Culture adequate volume   Culture   Final    NO GROWTH 1 DAY Performed at [facility], [place] [address], [place], [place] [address]    Report Status PENDING  Incomplete  Culture, blood (routine x 2)     Status: None (Preliminary result)   Collection Time: 01/08/21  8:43 AM   Specimen: BLOOD RIGHT HAND  Result Value Ref Range Status   Specimen Description BLOOD RIGHT HAND  Final   Special Requests   Final    BOTTLES DRAWN AEROBIC AND ANAEROBIC Blood Culture adequate volume   Culture   Final    NO GROWTH 1 DAY Performed at [facility], [place] [address] [place], [place] [address]    Report Status PENDING  Incomplete    RADIOLOGY STUDIES/RESULTS: No results found.   LOS: 1 day   Oren Binet, MD  [facility]    To contact the attending provider between 7A-7P or the covering provider during after hours 7P-7A, please log into the web site [email or web address] and access using universal [place] password for that web site. If you do not have the password, please  call the hospital operator.  01/09/2021, 1:57 PM

## 2021-01-09 NOTE — Progress Notes (Signed)
PT Cancellation Note  Patient Details Name: Kayla Thompson MRN: [id number] DOB: 06/17/1935   Cancelled Treatment:    Reason Eval/Treat Not Completed: Other (comment).  Awaiting a short rest and then will retry.   Ramond Dial 01/09/2021, 11:21 AM  Mee Hives, PT PhD Acute Rehab Dept. Number: [place] [facility] [place]

## 2021-01-09 NOTE — Evaluation (Signed)
Occupational Therapy Evaluation Patient Details Name: Kayla Thompson MRN: [id number] DOB: February 08, 1936 Today's Date: 01/09/2021   History of Present Illness 85 yo female with onset of AMS was brought to hosp to admit 10/19.  Overnight was confused, N&V, and had dysuria at some point.  Noted acute metabolic encephalopathy, sepsis.  PMHx:  CHF, PAD, HTN, UTI, PMR on steroids, asthma, granulomatous lung disease, COPD, hypothyroidism, DM, GERD, HLD, cognitive impairment.   Clinical Impression   Patient admitted for the diagnosis above.  PCA in the room, information gathered from [place].  Patient has 24 hour coverage at home, typically walks with RW and supervision.  Assist with all IADL, lower body ADL with showers 3x/wk, setup for meds, assist with hygiene s/p BM, and she is able to feed herself.  Currently lethargic, but will open eyes, and follow one step commands with increased time.  Currently general mobility is closer to Max A, and needing up to Mod A for upper body ADL at bed level.  OT will follow in the acute setting to maximize function, but PCA expects home with prior supports including ? PT at home 2x/wk for HEP and mobility.       Recommendations for follow up therapy are one component of a multi-disciplinary discharge planning process, led by the attending physician.  Recommendations may be updated based on patient status, additional functional criteria and insurance authorization.   Follow Up Recommendations  No OT follow up    Equipment Recommendations  None recommended by OT    Recommendations for Other Services       Precautions / Restrictions Precautions Precautions: Fall Restrictions Weight Bearing Restrictions: No      Mobility Bed Mobility Overal bed mobility: Needs Assistance Bed Mobility: Supine to Sit;Sit to Supine     Supine to sit: Max assist Sit to supine: Max assist     Patient Response: Flat affect  Transfers Overall transfer level: Needs  assistance   Transfers: Sit to/from Stand;Stand Pivot Transfers Sit to Stand: Max assist Stand pivot transfers: Max assist            Balance Overall balance assessment: Needs assistance Sitting-balance support: Bilateral upper extremity supported;Feet supported Sitting balance-Leahy Scale: Fair   Postural control: Posterior lean Standing balance support: Bilateral upper extremity supported Standing balance-Leahy Scale: Poor Standing balance comment: relies on RW and external support                           ADL either performed or assessed with clinical judgement   ADL       Grooming: Wash/dry face;Moderate assistance;Bed level   Upper Body Bathing: Moderate assistance;Bed level       Upper Body Dressing : Maximal assistance;Bed level       Toilet Transfer: Maximal assistance;Squat-pivot;BSC             General ADL Comments: Has 24 hour coverage for ADL/IADL assist as needed.     Vision Baseline Vision/History: 1 Wears glasses Patient Visual Report: No change from baseline       Perception     Praxis      Pertinent Vitals/Pain Pain Assessment: Faces Faces Pain Scale: No hurt Pain Intervention(s): Monitored during session     Hand Dominance     Extremity/Trunk Assessment Upper Extremity Assessment Upper Extremity Assessment: Generalized weakness;RUE deficits/detail;LUE deficits/detail RUE Deficits / Details: limited shoulder AROM RUE Sensation: WNL RUE Coordination: WNL LUE Deficits / Details: limited shoulder  AROM LUE Sensation: WNL LUE Coordination: WNL   Lower Extremity Assessment Lower Extremity Assessment: Defer to PT evaluation (Hx of R ankle fusion)   Cervical / Trunk Assessment Cervical / Trunk Assessment: Kyphotic   Communication Communication Communication: No difficulties   Cognition Arousal/Alertness: Lethargic Behavior During Therapy: Flat affect Overall Cognitive Status: Impaired/Different from  baseline Area of Impairment: Orientation;Attention;Memory;Following commands;Awareness;Problem solving                 Orientation Level: Disoriented to;Place;Time;Situation Current Attention Level: Sustained Memory: Decreased short-term memory Following Commands: Follows one step commands with increased time   Awareness: Intellectual Problem Solving: Slow processing;Decreased initiation;Difficulty sequencing;Requires tactile cues;Requires verbal cues                      Home Living Family/patient expects to be discharged to:: Private residence Living Arrangements: Alone Available Help at Discharge: Family;Personal care attendant;Available 24 hours/day Type of Home: House Home Access: Stairs to enter [employer] of Steps: 1 full step to a patio, then threshold Entrance Stairs-Rails: None Home Layout: One level     Bathroom Shower/Tub: Occupational psychologist: Standard Bathroom Accessibility: Yes How Accessible: Accessible via walker Home Equipment: [place] - 2 wheels;Bedside commode;Shower seat;Walker - 4 wheels;Hand held shower head;Grab bars - tub/shower;Wheelchair - manual   Additional Comments: 2 WRW outside, and 4WRW in the home.      Prior Functioning/Environment Level of Independence: Needs assistance  Gait / Transfers Assistance Needed: Pt reports aides assist her OOB and with transfers, SUPV with ambulation in home and community using rollator ADL's / Homemaking Assistance Needed: Pt reports home aides complete household chores, Assist with bathing, dressing, toileting due to limited shoulder mobility L>R.  PCA sets up medications and assists with community mobility.            OT Problem List: Decreased strength;Decreased activity tolerance;Impaired balance (sitting and/or standing);Decreased safety awareness;Decreased cognition      OT Treatment/Interventions: Self-care/ADL training;Therapeutic activities;Cognitive  remediation/compensation;Patient/family education;Balance training    OT Goals(Current goals can be found in the care plan section) Acute Rehab OT Goals Patient Stated Goal: PCA states home with prior level of services.. has a HH ? PT for mobility and HEP. OT Goal Formulation: With patient Time For Goal Achievement: 01/23/21 Potential to Achieve Goals: Good ADL Goals Pt Will Perform Grooming: with supervision;sitting Pt Will Perform Upper Body Bathing: with supervision;sitting Pt Will Perform Upper Body Dressing: with supervision;sitting Pt Will Transfer to Toilet: ambulating;regular height toilet;with min assist  OT Frequency: Min 2X/week   Barriers to D/C:    None noted       Co-evaluation              AM-PAC OT "6 Clicks" Daily Activity     Outcome Measure Help from another person eating meals?: A Lot Help from another person taking care of personal grooming?: A Lot Help from another person toileting, which includes using toliet, bedpan, or urinal?: Total Help from another person bathing (including washing, rinsing, drying)?: A Lot Help from another person to put on and taking off regular upper body clothing?: A Lot Help from another person to put on and taking off regular lower body clothing?: Total 6 Click Score: 10   End of Session Equipment Utilized During Treatment: Gait belt;Rolling walker  Activity Tolerance: Patient limited by lethargy Patient left: in bed;with call bell/phone within reach;with bed alarm set;with family/visitor present  OT Visit Diagnosis: Unsteadiness on feet (R26.81);Other symptoms  and signs involving cognitive function                Time: 1052-1110 OT Time Calculation (min): 18 min Charges:  OT General Charges $OT Visit: 1 Visit OT Evaluation $OT Eval Moderate Complexity: 1 Mod  01/09/2021  RP, OTR/L  Acute Rehabilitation Services  Office:  [phone]   Metta Clines 01/09/2021, 11:22 AM

## 2021-01-10 ENCOUNTER — Inpatient Hospital Stay (HOSPITAL_COMMUNITY): Payer: Medicare Other

## 2021-01-10 DIAGNOSIS — N390 Urinary tract infection, site not specified: Secondary | ICD-10-CM | POA: Diagnosis not present

## 2021-01-10 DIAGNOSIS — E039 Hypothyroidism, unspecified: Secondary | ICD-10-CM | POA: Diagnosis not present

## 2021-01-10 DIAGNOSIS — G9341 Metabolic encephalopathy: Secondary | ICD-10-CM | POA: Diagnosis not present

## 2021-01-10 DIAGNOSIS — M353 Polymyalgia rheumatica: Secondary | ICD-10-CM | POA: Diagnosis not present

## 2021-01-10 LAB — BASIC METABOLIC PANEL
Anion gap: 8 (ref 5–15)
BUN: 8 mg/dL (ref 8–23)
CO2: 28 mmol/L (ref 22–32)
Calcium: 9.7 mg/dL (ref 8.9–10.3)
Chloride: 102 mmol/L (ref 98–111)
Creatinine, Ser: 0.82 mg/dL (ref 0.44–1.00)
GFR, Estimated: >60 mL/min (ref >60)
Glucose, Bld: 132 mg/dL — ABNORMAL HIGH (ref 70–99)
Potassium: 3.9 mmol/L (ref 3.5–5.1)
Sodium: 138 mmol/L (ref 135–145)

## 2021-01-10 LAB — CBC
HCT: 31.2 % — ABNORMAL LOW (ref 36.0–46.0)
Hemoglobin: 10.1 g/dL — ABNORMAL LOW (ref 12.0–15.0)
MCH: 37 pg — ABNORMAL HIGH (ref 26.0–34.0)
MCHC: 32.4 g/dL (ref 30.0–36.0)
MCV: 114.3 fL — ABNORMAL HIGH (ref 80.0–100.0)
Platelets: 280 10*3/uL (ref 150–400)
RBC: 2.73 MIL/uL — ABNORMAL LOW (ref 3.87–5.11)
RDW: 13.3 % (ref 11.5–15.5)
WBC: 13.2 10*3/uL — ABNORMAL HIGH (ref 4.0–10.5)
nRBC: 0 % (ref 0.0–0.2)

## 2021-01-10 LAB — GLUCOSE, CAPILLARY
Glucose-Capillary: 132 mg/dL — ABNORMAL HIGH (ref 70–99)
Glucose-Capillary: 147 mg/dL — ABNORMAL HIGH (ref 70–99)
Glucose-Capillary: 178 mg/dL — ABNORMAL HIGH (ref 70–99)

## 2021-01-10 MED ORDER — METRONIDAZOLE 500 MG/100ML IV SOLN
500.0000 mg | Freq: Two times a day (BID) | INTRAVENOUS | Status: DC
  Administered 2021-01-10 – 2021-01-12 (×5): 500 mg via INTRAVENOUS
  Filled 2021-01-10 (×5): qty 100

## 2021-01-10 MED ORDER — HYDROCORTISONE 100 MG/60ML RE ENEM
100.0000 mg | ENEMA | Freq: Every day | RECTAL | Status: DC
  Filled 2021-01-10: qty 1

## 2021-01-10 MED ORDER — SODIUM CHLORIDE 0.9 % IV SOLN: INTRAVENOUS | Status: DC | PRN

## 2021-01-10 NOTE — Plan of Care (Signed)
  Problem: Clinical Measurements: Goal: Diagnostic test results will improve Outcome: Progressing Goal: Respiratory complications will improve Outcome: Progressing   Problem: Activity: Goal: Risk for activity intolerance will decrease Outcome: Progressing   Problem: Elimination: Goal: Will not experience complications related to bowel motility Outcome: Progressing Goal: Will not experience complications related to urinary retention Outcome: Progressing   Problem: Safety: Goal: Ability to remain free from injury will improve Outcome: Progressing   Problem: Skin Integrity: Goal: Risk for impaired skin integrity will decrease Outcome: Progressing

## 2021-01-10 NOTE — Progress Notes (Signed)
PROGRESS NOTE        PATIENT DETAILS Name: Kayla Thompson Age: 85 y.o. Sex: female Date of Birth: 17-Feb-1936 Admit Date: 01/07/2021 Admitting Physician Evalee Mutton Kristeen Mans, MD [id number], Real Cons, MD  Brief Narrative: Patient is a 85 y.o. female with history of HFpEF, HTN, PAD, frequent UTIs on suppressive therapy with Macrobid, PMR on chronic steroids, COPD/asthma, DM-2, HLD-who presented with altered mental status-thought to have sepsis physiology secondary to UTI-however post hospitalization-developed diarrhea-CT abdomen showed proctitis.    Subjective: Improving-she denies any tenesmus today.  Diarrhea has improved.  Caregiver at bedside.  Objective: Vitals: Blood pressure (!) 148/84, pulse (!) 103, temperature 98.3 F (36.8 C), temperature source Oral, resp. rate 19, height 4\' 11"  (1.499 m), weight 48.3 kg, SpO2 97 %.   Exam: Gen Exam:Alert awake-not in any distress.  Looks frail HEENT:atraumatic, normocephalic Chest: B/L clear to auscultation anteriorly CVS:S1S2 regular Abdomen:soft non tender, non distended Extremities:no edema Neurology: Non focal Skin: no rash   Pertinent Labs/Radiology: WBC: 21.3>>19.0> 13.2 Hb: 10.1 Na: 138  Creatinine: 0.82  10/19>>Urine Culture: Insignificant growth 10/20>>Blood culture: No growth 10/21>> stool C. difficile: Negative  10/21>> CT abdomen/pelvis: Circumferential thickening of the rectum concerning for proctitis.  Thickened/irregular appearance of the posterior bladder wall.  Assessment/Plan: Sepsis due to proctitis/?  UTI: Sepsis physiology has resolved-leukocytosis continues to downtrend-initially thought to have UTI-however it her presentation on admission may have been due to proctitis.   On 10/21-she developed lower abdominal pain/tenesmus and loose stools-subsequent CT abdomen showed proctitis.  Stool C. difficile studies negative-GI pathogen panel ordered (but not done yet as diarrhea  better).  Continue Rocephin-have added Flagyl.  Chart reviewed with GI MD-Dr. Gupta-recommends a trial of steroid enemas to see how she does.  If no response to antibiotic/steroid enemas-plans are to formally consult GI for possible flex sigmoidoscopy.  Follow closely.  Acute metabolic encephalopathy: Due to sepsis/UTI-improved-suspect close to baseline.  Continue supportive care.   Hypothyroidism: Continue levothyroxine-TSH stable.  Polymyalgia rheumatica: Stable-continue usual regimen of prednisone.  COPD/asthma: Stable-not in exacerbation-continue bronchodilators.  DM-2 (A1c 6.4 on 9/29): CBG stable on SSI.  Follow and adjust.  Recent Labs    01/09/21 2039 01/10/21 0752 01/10/21 1200  GLUCAP 270* 132* 147*     Depression: Appears stable-continue Cymbalta/Elavil  Chronic pain: Resume gabapentin/baclofen.  Debility/deconditioning/functional quadriplegia: Walks with the help of a walker at baseline-worsening debility likely due to acute illness-obtaining PT/OT eval.  Procedures: None Consults: None DVT Prophylaxis: Lovenox Code Status:Full code  Family Communication: Daughter Rhonda-[phone]-updated on 10/22  Time spent: 57 minutes-Greater than 50% of this time was spent in counseling, explanation of diagnosis, planning of further management, and coordination of care.  Diet: Diet Order             Diet Heart Room service appropriate? Yes; Fluid consistency: Thin  Diet effective now                      Disposition Plan: Status is: Inpatient  The patient will require care spanning > 2 midnights and should be moved to inpatient because: Inpatient level of care due to requiring IV therapies.   Barriers to Discharge: Resolving sepsis physiology/encephalopathy from UTI-significantly weaker than baseline-needs continued IV antibiotics-and inpatient PT/OT eval for safe discharge.  Antimicrobial agents: Anti-infectives (From admission, onward)    Start  Dose/Rate Route Frequency Ordered Stop   01/10/21 0800  metroNIDAZOLE (FLAGYL) IVPB 500 mg        500 mg 100 mL/hr over 60 Minutes Intravenous Every 12 hours 01/10/21 0706     01/08/21 1000  cefTRIAXone (ROCEPHIN) 1 g in sodium chloride 0.9 % 100 mL IVPB  Status:  Discontinued        1 g 200 mL/hr over 30 Minutes Intravenous Every 24 hours 01/07/21 2144 01/08/21 0736   01/08/21 0800  cefTRIAXone (ROCEPHIN) 2 g in sodium chloride 0.9 % 100 mL IVPB        2 g 200 mL/hr over 30 Minutes Intravenous Every 24 hours 01/08/21 0736     01/07/21 2000  cefTRIAXone (ROCEPHIN) 1 g in sodium chloride 0.9 % 100 mL IVPB        1 g 200 mL/hr over 30 Minutes Intravenous  Once 01/07/21 1947 01/07/21 2049        MEDICATIONS: Scheduled Meds:  amitriptyline  10 mg Oral QHS   vitamin C  1,000 mg Oral BID   baclofen  10 mg Oral QPM   calcium carbonate  625 mg Oral BID WC   cholecalciferol  2,000 Units Oral q AM   DULoxetine  90 mg Oral Daily   enoxaparin (LOVENOX) injection  30 mg Subcutaneous Q24H   feeding supplement  237 mL Oral TID BM   fluticasone furoate-vilanterol  1 puff Inhalation Daily   gabapentin  100 mg Oral QHS   Gerhardt's butt cream   Topical QID   insulin aspart  0-9 Units Subcutaneous TID WC   levothyroxine  50 mcg Oral QAC breakfast   montelukast  10 mg Oral QPM   polyethylene glycol  17 g Oral Daily   predniSONE  2 mg Oral Q breakfast   rosuvastatin  10 mg Oral QPM   senna-docusate  1 tablet Oral QHS   umeclidinium bromide  1 puff Inhalation Daily   Continuous Infusions:  sodium chloride 10 mL/hr at 01/10/21 0855   cefTRIAXone (ROCEPHIN)  IV 2 g (01/10/21 0857)   metronidazole 500 mg (01/10/21 0900)   PRN Meds:.sodium chloride, albuterol, bisacodyl, loperamide, ondansetron (ZOFRAN) IV, oxyCODONE-acetaminophen   I have personally reviewed following labs and imaging studies  LABORATORY DATA: CBC: Recent Labs  Lab 01/07/21 1621 01/07/21 1646 01/08/21 0353  01/09/21 0124 01/10/21 0748  WBC 16.6*  --  21.3* 19.1* 13.2*  NEUTROABS 12.8*  --   --   --   --   HGB 10.2* 10.5* 9.4* 9.3* 10.1*  HCT 31.5* 31.0* 29.2* 28.2* 31.2*  MCV 114.5*  --  114.5* 111.9* 114.3*  PLT 365  --  354 284 280     Basic Metabolic Panel: Recent Labs  Lab 01/07/21 1621 01/07/21 1646 01/08/21 0353 01/09/21 0124 01/10/21 0748  NA 132* 134* 132* 133* 138  K 4.0 3.9 3.4* 3.8 3.9  CL 99  --  100 100 102  CO2 24  --  24 25 28   GLUCOSE 175*  --  52* 209* 132*  BUN 23  --  16 11 8   CREATININE 0.86  --  0.50 0.78 0.82  CALCIUM 9.4  --  9.1 9.2 9.7     GFR: Estimated Creatinine Clearance: 35.5 mL/min (by C-G formula based on SCr of 0.82 mg/dL).  Liver Function Tests: Recent Labs  Lab 01/07/21 1621  AST 20  ALT 20  ALKPHOS 50  BILITOT 0.8  PROT 6.1*  ALBUMIN 3.1*  No results for input(s): LIPASE, AMYLASE in the last 168 hours. No results for input(s): AMMONIA in the last 168 hours.  Coagulation Profile: No results for input(s): INR, PROTIME in the last 168 hours.  Cardiac Enzymes: No results for input(s): CKTOTAL, CKMB, CKMBINDEX, TROPONINI in the last 168 hours.  BNP (last 3 results) Recent Labs    01/25/20 1147 11/26/20 1339  PROBNP 103.0* 291     Lipid Profile: No results for input(s): CHOL, HDL, LDLCALC, TRIG, CHOLHDL, LDLDIRECT in the last 72 hours.  Thyroid Function Tests: Recent Labs    01/08/21 0354  TSH 1.342     Anemia Panel: Recent Labs    01/08/21 0353 01/08/21 0354  VITAMINB12 1,297*  --   FOLATE  --  25.6     Urine analysis:    Component Value Date/Time   COLORURINE YELLOW 01/07/2021 1625   APPEARANCEUR CLOUDY (A) 01/07/2021 1625   LABSPEC 1.015 01/07/2021 1625   PHURINE 6.0 01/07/2021 1625   GLUCOSEU NEGATIVE 01/07/2021 1625   GLUCOSEU NEGATIVE 07/17/2020 0939   HGBUR SMALL (A) 01/07/2021 1625   BILIRUBINUR NEGATIVE 01/07/2021 1625   BILIRUBINUR 1+ 06/13/2019 1531   KETONESUR NEGATIVE 01/07/2021  1625   PROTEINUR 30 (A) 01/07/2021 1625   UROBILINOGEN 0.2 07/17/2020 0939   NITRITE NEGATIVE 01/07/2021 1625   LEUKOCYTESUR LARGE (A) 01/07/2021 1625    Sepsis Labs: Lactic Acid, Venous    Component Value Date/Time   LATICACIDVEN 0.8 01/07/2021 1831    MICROBIOLOGY: Recent Results (from the past 240 hour(s))  Urine Culture     Status: Abnormal   Collection Time: 01/07/21  7:48 PM   Specimen: Urine, Clean Catch  Result Value Ref Range Status   Specimen Description URINE, CLEAN CATCH  Final   Special Requests NONE  Final   Culture (A)  Final    <10,000 COLONIES/mL INSIGNIFICANT GROWTH Performed at [facility], [place] [address], [place], [place] [address]    Report Status 01/09/2021 FINAL  Final  Resp Panel by RT-PCR (Flu A&B, Covid) Nasopharyngeal Swab     Status: None   Collection Time: 01/07/21 11:36 PM   Specimen: Nasopharyngeal Swab; Nasopharyngeal(NP) swabs in vial transport medium  Result Value Ref Range Status   SARS Coronavirus 2 by RT PCR NEGATIVE NEGATIVE Final    Comment: (NOTE) SARS-CoV-2 target nucleic acids are NOT DETECTED.  The SARS-CoV-2 RNA is generally detectable in upper respiratory specimens during the acute phase of infection. The lowest concentration of SARS-CoV-2 viral copies this assay can detect is 138 copies/mL. A negative result does not preclude SARS-Cov-2 infection and should not be used as the sole basis for treatment or other patient management decisions. A negative result may occur with  improper specimen collection/handling, submission of specimen other than nasopharyngeal swab, presence of viral mutation(s) within the areas targeted by this assay, and inadequate number of viral copies(<138 copies/mL). A negative result must be combined with clinical observations, patient history, and epidemiological information. The expected result is Negative.  Fact Sheet for Patients:  EntrepreneurPulse.com.au  Fact Sheet for  Healthcare Providers:  IncredibleEmployment.be  This test is no t yet approved or cleared by the [place] FDA and  has been authorized for detection and/or diagnosis of SARS-CoV-2 by FDA under an Emergency Use Authorization (EUA). This EUA will remain  in effect (meaning this test can be used) for the duration of the COVID-19 declaration under Section 564(b)(1) of the Act, 21 U.S.C.section 360bbb-3(b)(1), unless the authorization is terminated  or revoked sooner.       Influenza A by PCR NEGATIVE NEGATIVE Final   Influenza B by PCR NEGATIVE NEGATIVE Final    Comment: (NOTE) The Xpert Xpress SARS-CoV-2/FLU/RSV plus assay is intended as an aid in the diagnosis of influenza from Nasopharyngeal swab specimens and should not be used as a sole basis for treatment. Nasal washings and aspirates are unacceptable for Xpert Xpress SARS-CoV-2/FLU/RSV testing.  Fact Sheet for Patients: EntrepreneurPulse.com.au  Fact Sheet for Healthcare Providers: IncredibleEmployment.be  This test is not yet approved or cleared by the [place] FDA and has been authorized for detection and/or diagnosis of SARS-CoV-2 by FDA under an Emergency Use Authorization (EUA). This EUA will remain in effect (meaning this test can be used) for the duration of the COVID-19 declaration under Section 564(b)(1) of the Act, 21 U.S.C. section 360bbb-3(b)(1), unless the authorization is terminated or revoked.  Performed at [facility], [place] [address] [place], [place] [address]   Culture, blood (routine x 2)     Status: None (Preliminary result)   Collection Time: 01/08/21  8:30 AM   Specimen: BLOOD RIGHT ARM  Result Value Ref Range Status   Specimen Description BLOOD RIGHT ARM  Final   Special Requests   Final    BOTTLES DRAWN AEROBIC AND ANAEROBIC Blood Culture adequate volume   Culture   Final    NO GROWTH 2 DAYS Performed at [facility], [place] [address] [place], [place] [address]    Report Status PENDING  Incomplete  Culture, blood (routine x 2)     Status: None (Preliminary result)   Collection Time: 01/08/21  8:43 AM   Specimen: BLOOD RIGHT HAND  Result Value Ref Range Status   Specimen Description BLOOD RIGHT HAND  Final   Special Requests   Final    BOTTLES DRAWN AEROBIC AND ANAEROBIC Blood Culture adequate volume   Culture   Final    NO GROWTH 2 DAYS Performed at [facility], [place] [address] [place], [place] [address]    Report Status PENDING  Incomplete  C Difficile Quick Screen w PCR reflex     Status: None   Collection Time: 01/09/21  9:03 PM   Specimen: STOOL  Result Value Ref Range Status   C Diff antigen NEGATIVE NEGATIVE Final   C Diff toxin NEGATIVE NEGATIVE Final   C Diff interpretation No C. difficile detected.  Final    Comment: Performed at [facility], [place] [address], [place], [place] [address]    RADIOLOGY STUDIES/RESULTS: CT ABDOMEN PELVIS WO CONTRAST  Result Date: 01/10/2021 CLINICAL DATA:  Abdominal pain. EXAM: CT ABDOMEN AND PELVIS WITHOUT CONTRAST TECHNIQUE: Multidetector CT imaging of the abdomen and pelvis was performed following the standard protocol without IV contrast. COMPARISON:  CT abdomen pelvis dated 01/25/2019. FINDINGS: Evaluation of this exam is limited in the absence of intravenous contrast. Lower chest: There is coronary vascular calcification and calcification of the mitral annulus. Patchy area of airspace consolidation at the right lung base may represent atelectasis or infiltrate. No intra-abdominal free air or free fluid. Hepatobiliary: The liver is grossly unremarkable. No intrahepatic biliary dilatation. Layering sludge or small stones within the gallbladder. No pericholecystic fluid or evidence of acute cholecystitis by CT. Pancreas: Moderately atrophic pancreas. No acute inflammatory process. Spleen: Normal in size without focal abnormality. Adrenals/Urinary  Tract: The adrenal glands unremarkable. There is no hydronephrosis or nephrolithiasis on either side. The visualized ureters are grossly  unremarkable. The urinary bladder is partially distended. There is suboptimal opacification of the bladder due to streak artifact caused by right hip hardware. There is however thickened and irregular appearance of the posterior bladder wall. Cystoscopy may provide better evaluation of the bladder. A 3 mm focus of calcification along the right posterior bladder wall may represent phlebolith or less likely distal right ureteral/UVJ calculus. Stomach/Bowel: There is circumferential thickening appearance of the rectum concerning for proctitis. No bowel obstruction. Postsurgical changes of the bowel with anastomotic suture in the right lower quadrant. Vascular/Lymphatic: Advanced aortoiliac atherosclerotic disease. The IVC is grossly unremarkable. No portal venous gas. There is no adenopathy. Reproductive: Hysterectomy. Other: None Musculoskeletal: Osteopenia with degenerative changes of the spine. Old healed right pubic bone fracture. Prior ORIF of the right femoral neck fracture. No acute osseous pathology. IMPRESSION: 1. Circumferential thickening appearance of the rectum concerning for proctitis. No bowel obstruction. 2. Thickened and irregular appearance of the posterior bladder wall. Correlation with cystoscopy recommended. 3. No hydronephrosis or nephrolithiasis. 4. Patchy area of airspace consolidation at the right lung base may represent atelectasis or infiltrate. 5. Aortic Atherosclerosis (ICD10-I70.0). Electronically Signed   By: Anner Crete M.D.   On: 01/10/2021 01:14     LOS: 2 days   Oren Binet, MD  [facility]    To contact the attending provider between 7A-7P or the covering provider during after hours 7P-7A, please log into the web site [email or web address] and access using universal [place] password for that web site. If you do not have the  password, please call the hospital operator.  01/10/2021, 3:07 PM

## 2021-01-11 ENCOUNTER — Inpatient Hospital Stay (HOSPITAL_COMMUNITY): Payer: Medicare Other

## 2021-01-11 DIAGNOSIS — G9341 Metabolic encephalopathy: Secondary | ICD-10-CM | POA: Diagnosis not present

## 2021-01-11 LAB — CBC
HCT: 27.8 % — ABNORMAL LOW (ref 36.0–46.0)
Hemoglobin: 8.9 g/dL — ABNORMAL LOW (ref 12.0–15.0)
MCH: 36.3 pg — ABNORMAL HIGH (ref 26.0–34.0)
MCHC: 32 g/dL (ref 30.0–36.0)
MCV: 113.5 fL — ABNORMAL HIGH (ref 80.0–100.0)
Platelets: 273 10*3/uL (ref 150–400)
RBC: 2.45 MIL/uL — ABNORMAL LOW (ref 3.87–5.11)
RDW: 12.7 % (ref 11.5–15.5)
WBC: 12.8 10*3/uL — ABNORMAL HIGH (ref 4.0–10.5)
nRBC: 0 % (ref 0.0–0.2)

## 2021-01-11 LAB — BASIC METABOLIC PANEL
Anion gap: 8 (ref 5–15)
BUN: 13 mg/dL (ref 8–23)
CO2: 26 mmol/L (ref 22–32)
Calcium: 9.2 mg/dL (ref 8.9–10.3)
Chloride: 98 mmol/L (ref 98–111)
Creatinine, Ser: 0.9 mg/dL (ref 0.44–1.00)
GFR, Estimated: >60 mL/min (ref >60)
Glucose, Bld: 219 mg/dL — ABNORMAL HIGH (ref 70–99)
Potassium: 3.5 mmol/L (ref 3.5–5.1)
Sodium: 132 mmol/L — ABNORMAL LOW (ref 135–145)

## 2021-01-11 LAB — GLUCOSE, CAPILLARY
Glucose-Capillary: 212 mg/dL — ABNORMAL HIGH (ref 70–99)
Glucose-Capillary: 170 mg/dL — ABNORMAL HIGH (ref 70–99)
Glucose-Capillary: 188 mg/dL — ABNORMAL HIGH (ref 70–99)
Glucose-Capillary: 136 mg/dL — ABNORMAL HIGH (ref 70–99)
Glucose-Capillary: 232 mg/dL — ABNORMAL HIGH (ref 70–99)

## 2021-01-11 MED ORDER — POTASSIUM CHLORIDE CRYS ER 20 MEQ PO TBCR
40.0000 meq | EXTENDED_RELEASE_TABLET | Freq: Once | ORAL | Status: AC
  Administered 2021-01-11: 40 meq via ORAL
  Filled 2021-01-11: qty 2

## 2021-01-11 MED ORDER — HYDROCORTISONE ACETATE 25 MG RE SUPP
25.0000 mg | Freq: Two times a day (BID) | RECTAL | Status: DC
  Administered 2021-01-11: 25 mg via RECTAL
  Filled 2021-01-11 (×3): qty 1

## 2021-01-11 MED ORDER — BISACODYL 5 MG PO TBEC
5.0000 mg | DELAYED_RELEASE_TABLET | Freq: Every day | ORAL | Status: DC
  Administered 2021-01-12: 5 mg via ORAL
  Filled 2021-01-11: qty 1

## 2021-01-11 NOTE — Progress Notes (Signed)
PROGRESS NOTE        PATIENT DETAILS Name: Kayla Thompson Age: 85 y.o. Sex: female Date of Birth: 10-20-1935 Admit Date: 01/07/2021 Admitting Physician Evalee Mutton Kristeen Mans, MD [id number], Real Cons, MD  Brief Narrative: Patient is a 85 y.o. female with history of HFpEF, HTN, PAD, frequent UTIs on suppressive therapy with Macrobid, PMR on chronic steroids, COPD/asthma, DM-2, HLD-who presented with altered mental status-thought to have sepsis physiology secondary to UTI-however post hospitalization-developed diarrhea-CT abdomen showed proctitis.    Subjective: Patient in bed, appears comfortable, denies any headache, no fever, no chest pain or pressure, no shortness of breath , no abdominal pain. No new focal weakness.   Objective: Vitals: Blood pressure (!) 147/80, pulse 84, temperature 98 F (36.7 C), temperature source Oral, resp. rate 18, height 4\' 11"  (1.499 m), weight 48.3 kg, SpO2 98 %.   Exam:  Awake Alert, No new F.N deficits, Normal affect [place].AT,PERRAL Supple Neck, No JVD,   Symmetrical Chest wall movement, Good air movement bilaterally, CTAB RRR,No Gallops, Rubs or new Murmurs,  +ve B.Sounds, Abd Soft, No tenderness,   No Cyanosis, Clubbing or edema,     Pertinent Labs/Radiology: WBC: 21.3>>19.0> 13.2 Hb: 10.1 Na: 138  Creatinine: 0.82  10/19>>Urine Culture: Insignificant growth 10/20>>Blood culture: No growth 10/21>> stool C. difficile: Negative  10/21>> CT abdomen/pelvis: Circumferential thickening of the rectum concerning for proctitis.  Thickened/irregular appearance of the posterior bladder wall.  Assessment/Plan:  Sepsis due to proctitis/?  UTI: Sepsis physiology has resolved-leukocytosis continues to downtrend-initially thought to have UTI-however it her presentation on admission may have been due to proctitis.   On 10/21-she developed lower abdominal pain/tenesmus and loose stools-subsequent CT abdomen showed proctitis.   Stool C. difficile studies negative-GI pathogen panel ordered (but not done yet as diarrhea better).  Continue Rocephin-have added Flagyl.  Previous MD reviewed the chart with GI physician Dr. Lyndel Safe, also got detailed history from patient and daughter on 01/11/2021, proctitis likely due to chronic bowel impaction and constipation.  Much improved with supportive care including rectal steroid suppository/enema, continue bowel regimen, clinically much better likely discharge on 01/12/2021, sepsis most likely due to UTI, will be discharged on short course of oral antibiotics along with bowel regimen and steroid suppositories.  Acute metabolic encephalopathy: Due to sepsis/UTI-improved-suspect close to baseline.  Continue supportive care.   Hypothyroidism: Continue levothyroxine-TSH stable.  Polymyalgia rheumatica: Stable-continue usual regimen of prednisone.  COPD/asthma: Stable-not in exacerbation-continue bronchodilators.  DM-2 (A1c 6.4 on 9/29): CBG stable on SSI.  Follow and adjust.  Recent Labs    01/10/21 1556 01/11/21 0139 01/11/21 0756  GLUCAP 178* 212* 170*    Depression: Appears stable-continue Cymbalta/Elavil  Chronic pain: Resume gabapentin/baclofen.  Does have history suggestive of narcotic bowel placed on bowel regimen.  Debility/deconditioning/functional quadriplegia: Walks with the help of a walker at baseline-worsening debility likely due to acute illness-obtaining PT/OT eval.  Procedures: None Consults: None DVT Prophylaxis: Lovenox Code Status:Full code  Family Communication: Daughter [phone]-updated on 01/11/21  Time spent: 30 minutes-Greater than 50% of this time was spent in counseling, explanation of diagnosis, planning of further management, and coordination of care.  Diet: Diet Order             Diet Heart Room service appropriate? Yes; Fluid consistency: Thin  Diet effective now  Disposition Plan: Status is:  Inpatient  The patient will require care spanning > 2 midnights and should be moved to inpatient because: Inpatient level of care due to requiring IV therapies.   Barriers to Discharge: Resolving sepsis physiology/encephalopathy from UTI-significantly weaker than baseline-needs continued IV antibiotics-and inpatient PT/OT eval for safe discharge.  Antimicrobial agents: Anti-infectives (From admission, onward)    Start     Dose/Rate Route Frequency Ordered Stop   01/10/21 0800  metroNIDAZOLE (FLAGYL) IVPB 500 mg        500 mg 100 mL/hr over 60 Minutes Intravenous Every 12 hours 01/10/21 0706     01/08/21 1000  cefTRIAXone (ROCEPHIN) 1 g in sodium chloride 0.9 % 100 mL IVPB  Status:  Discontinued        1 g 200 mL/hr over 30 Minutes Intravenous Every 24 hours 01/07/21 2144 01/08/21 0736   01/08/21 0800  cefTRIAXone (ROCEPHIN) 2 g in sodium chloride 0.9 % 100 mL IVPB        2 g 200 mL/hr over 30 Minutes Intravenous Every 24 hours 01/08/21 0736     01/07/21 2000  cefTRIAXone (ROCEPHIN) 1 g in sodium chloride 0.9 % 100 mL IVPB        1 g 200 mL/hr over 30 Minutes Intravenous  Once 01/07/21 1947 01/07/21 2049        MEDICATIONS: Scheduled Meds:  amitriptyline  10 mg Oral QHS   vitamin C  1,000 mg Oral BID   baclofen  10 mg Oral QPM   bisacodyl  5 mg Oral Daily   calcium carbonate  625 mg Oral BID WC   cholecalciferol  2,000 Units Oral q AM   DULoxetine  90 mg Oral Daily   enoxaparin (LOVENOX) injection  30 mg Subcutaneous Q24H   feeding supplement  237 mL Oral TID BM   fluticasone furoate-vilanterol  1 puff Inhalation Daily   gabapentin  100 mg Oral QHS   Gerhardt's butt cream   Topical QID   hydrocortisone  25 mg Rectal BID   insulin aspart  0-9 Units Subcutaneous TID WC   levothyroxine  50 mcg Oral QAC breakfast   montelukast  10 mg Oral QPM   polyethylene glycol  17 g Oral Daily   predniSONE  2 mg Oral Q breakfast   rosuvastatin  10 mg Oral QPM   umeclidinium bromide  1  puff Inhalation Daily   Continuous Infusions:  sodium chloride Stopped (01/10/21 1006)   cefTRIAXone (ROCEPHIN)  IV 2 g (01/11/21 1006)   metronidazole 500 mg (01/11/21 0819)   PRN Meds:.sodium chloride, albuterol, bisacodyl, ondansetron (ZOFRAN) IV, oxyCODONE-acetaminophen   I have personally reviewed following labs and imaging studies  LABORATORY DATA: CBC: Recent Labs  Lab 01/07/21 1621 01/07/21 1646 01/08/21 0353 01/09/21 0124 01/10/21 0748 01/11/21 0153  WBC 16.6*  --  21.3* 19.1* 13.2* 12.8*  NEUTROABS 12.8*  --   --   --   --   --   HGB 10.2* 10.5* 9.4* 9.3* 10.1* 8.9*  HCT 31.5* 31.0* 29.2* 28.2* 31.2* 27.8*  MCV 114.5*  --  114.5* 111.9* 114.3* 113.5*  PLT 365  --  354 284 280 409    Basic Metabolic Panel: Recent Labs  Lab 01/07/21 1621 01/07/21 1646 01/08/21 0353 01/09/21 0124 01/10/21 0748 01/11/21 0153  NA 132* 134* 132* 133* 138 132*  K 4.0 3.9 3.4* 3.8 3.9 3.5  CL 99  --  100 100 102 98  CO2 24  --  24  25 28 26   GLUCOSE 175*  --  52* 209* 132* 219*  BUN 23  --  16 11 8 13   CREATININE 0.86  --  0.50 0.78 0.82 0.90  CALCIUM 9.4  --  9.1 9.2 9.7 9.2    GFR: Estimated Creatinine Clearance: 32.3 mL/min (by C-G formula based on SCr of 0.9 mg/dL).  Liver Function Tests: Recent Labs  Lab 01/07/21 1621  AST 20  ALT 20  ALKPHOS 50  BILITOT 0.8  PROT 6.1*  ALBUMIN 3.1*   No results for input(s): LIPASE, AMYLASE in the last 168 hours. No results for input(s): AMMONIA in the last 168 hours.  Coagulation Profile: No results for input(s): INR, PROTIME in the last 168 hours.  Cardiac Enzymes: No results for input(s): CKTOTAL, CKMB, CKMBINDEX, TROPONINI in the last 168 hours.  BNP (last 3 results) Recent Labs    01/25/20 1147 11/26/20 1339  PROBNP 103.0* 291    Lipid Profile: No results for input(s): CHOL, HDL, LDLCALC, TRIG, CHOLHDL, LDLDIRECT in the last 72 hours.  Thyroid Function Tests: No results for input(s): TSH, T4TOTAL, FREET4,  T3FREE, THYROIDAB in the last 72 hours.  Anemia Panel: No results for input(s): VITAMINB12, FOLATE, FERRITIN, TIBC, IRON, RETICCTPCT in the last 72 hours.  Urine analysis:    Component Value Date/Time   COLORURINE YELLOW 01/07/2021 1625   APPEARANCEUR CLOUDY (A) 01/07/2021 1625   LABSPEC 1.015 01/07/2021 1625   PHURINE 6.0 01/07/2021 1625   GLUCOSEU NEGATIVE 01/07/2021 1625   GLUCOSEU NEGATIVE 07/17/2020 0939   HGBUR SMALL (A) 01/07/2021 1625   BILIRUBINUR NEGATIVE 01/07/2021 1625   BILIRUBINUR 1+ 06/13/2019 1531   KETONESUR NEGATIVE 01/07/2021 1625   PROTEINUR 30 (A) 01/07/2021 1625   UROBILINOGEN 0.2 07/17/2020 0939   NITRITE NEGATIVE 01/07/2021 1625   LEUKOCYTESUR LARGE (A) 01/07/2021 1625    Sepsis Labs: Lactic Acid, Venous    Component Value Date/Time   LATICACIDVEN 0.8 01/07/2021 1831      RADIOLOGY STUDIES/RESULTS: CT ABDOMEN PELVIS WO CONTRAST  Result Date: 01/10/2021 CLINICAL DATA:  Abdominal pain. EXAM: CT ABDOMEN AND PELVIS WITHOUT CONTRAST TECHNIQUE: Multidetector CT imaging of the abdomen and pelvis was performed following the standard protocol without IV contrast. COMPARISON:  CT abdomen pelvis dated 01/25/2019. FINDINGS: Evaluation of this exam is limited in the absence of intravenous contrast. Lower chest: There is coronary vascular calcification and calcification of the mitral annulus. Patchy area of airspace consolidation at the right lung base may represent atelectasis or infiltrate. No intra-abdominal free air or free fluid. Hepatobiliary: The liver is grossly unremarkable. No intrahepatic biliary dilatation. Layering sludge or small stones within the gallbladder. No pericholecystic fluid or evidence of acute cholecystitis by CT. Pancreas: Moderately atrophic pancreas. No acute inflammatory process. Spleen: Normal in size without focal abnormality. Adrenals/Urinary Tract: The adrenal glands unremarkable. There is no hydronephrosis or nephrolithiasis on either  side. The visualized ureters are grossly unremarkable. The urinary bladder is partially distended. There is suboptimal opacification of the bladder due to streak artifact caused by right hip hardware. There is however thickened and irregular appearance of the posterior bladder wall. Cystoscopy may provide better evaluation of the bladder. A 3 mm focus of calcification along the right posterior bladder wall may represent phlebolith or less likely distal right ureteral/UVJ calculus. Stomach/Bowel: There is circumferential thickening appearance of the rectum concerning for proctitis. No bowel obstruction. Postsurgical changes of the bowel with anastomotic suture in the right lower quadrant. Vascular/Lymphatic: Advanced aortoiliac atherosclerotic disease. The IVC is  grossly unremarkable. No portal venous gas. There is no adenopathy. Reproductive: Hysterectomy. Other: None Musculoskeletal: Osteopenia with degenerative changes of the spine. Old healed right pubic bone fracture. Prior ORIF of the right femoral neck fracture. No acute osseous pathology. IMPRESSION: 1. Circumferential thickening appearance of the rectum concerning for proctitis. No bowel obstruction. 2. Thickened and irregular appearance of the posterior bladder wall. Correlation with cystoscopy recommended. 3. No hydronephrosis or nephrolithiasis. 4. Patchy area of airspace consolidation at the right lung base may represent atelectasis or infiltrate. 5. Aortic Atherosclerosis (ICD10-I70.0). Electronically Signed   By: Anner Crete M.D.   On: 01/10/2021 01:14     LOS: 3 days   Signature  Lala Lund M.D on 01/11/2021 at 11:10 AM   -  To page go to [email or web address]

## 2021-01-11 NOTE — TOC Initial Note (Signed)
Transition of Care [facility]) - Initial/Assessment Note    Patient Details  Name: Kayla Thompson MRN: [id number] Date of Birth: 02/26/1936  Transition of [facility]) CM/SW Contact:    Carles Collet, RN Phone Number: 01/11/2021, 12:21 PM  Clinical Narrative:               Damaris Schooner w patient's daughter over the phone.  She states that her mom lives at home, by herself, and she has aids that care for her 24 hours a day.  She has all needed DME RW, 3/1, lift chair, WC, and will not have any DME needs at DC.    She is active w [facility] through [facility]. CM placed resumption order and notified [place] planned for tomorrow. [name] states that either an aid or family will be able to provide transportation home. They would prefer DC between 12 and 2pm if possible but understands that a lot of different factors go into DC time.    [facility] Daughter   [phone]     Expected Discharge Plan: [place] Barriers to Discharge: Continued Medical Work up   Patient Goals and CMS Choice Patient states their goals for this hospitalization and ongoing recovery are:: to go home CMS Medicare.gov Compare Post Acute Care list provided to:: Other (Comment Required) Choice offered to / list presented to : Adult Children  Expected Discharge Plan and Services Expected Discharge Plan: [place]   Discharge Planning Services: CM Consult Post Acute Care Choice: Home Health   Expected Discharge Date: 01/11/21               DME Arranged: N/A         HH Arranged: PT HH Agency: [facility] Date [facility] Agency Contacted: 01/11/21 Time HH Agency Contacted: 1220 Representative spoke with at [place]: [place] Arrangements/Services   Lives with:: Self   Do you feel safe going back to the place where you live?: Yes               Activities of Daily Living Home Assistive Devices/Equipment: CBG Meter ADL Screening (condition at time of  admission) Patient's cognitive ability adequate to safely complete daily activities?: No Is the patient deaf or have difficulty hearing?: No Does the patient have difficulty seeing, even when wearing glasses/contacts?: No Does the patient have difficulty concentrating, remembering, or making decisions?: Yes Patient able to express need for assistance with ADLs?: Yes Does the patient have difficulty dressing or bathing?: Yes Independently performs ADLs?: No Does the patient have difficulty walking or climbing stairs?: Yes Weakness of Legs: Both Weakness of Arms/Hands: Both  Permission Sought/Granted                  Emotional Assessment              Admission diagnosis:  Altered mental status, unspecified altered mental status type [R41.82] AMS (altered mental status) [R41.82] Patient Active Problem List   Diagnosis Date Noted   Chronic obstructive pulmonary disease ([place]) 11/20/2020   Urinary tract infection without hematuria    Other chronic pain    UTI due to extended-spectrum beta lactamase (ESBL) producing Escherichia coli 07/30/2020   AMS (altered mental status) 07/07/2020   Hypertension associated with diabetes ([place]) 05/07/2020   Hyperlipidemia associated with type 2 diabetes mellitus ([place]) 05/07/2020   Depression 05/07/2020   PAD (peripheral artery disease) ([place]) 02/05/2020   Sepsis ([place]) 01/12/2020  Lower extremity edema 11/20/2019   UTI symptoms 05/29/2019   Dysuria 05/11/2019   Upper esophageal web 03/27/2019   Iron deficiency anemia 03/27/2019   Candida esophagitis ([place]) 02/14/2019   Acute lower UTI 24/58/0998   Acute metabolic encephalopathy 33/82/5053   Encounter for general adult medical examination with abnormal findings 01/09/2019   Abnormal barium swallow 12/28/2018   History of colonic polyps 12/28/2018   Dysphagia 12/28/2018   Diabetic foot ulcer ([place]) 12/05/2018   Rotator cuff arthropathy, left 11/16/2018   Degenerative arthritis of left  knee 11/16/2018   Insomnia 09/26/2018   Leukocytosis 10/17/2017   Severe protein-calorie malnutrition ([place]) 10/10/2017   Memory changes 07/29/2017   Granulomatous lung disease ([place]) 06/14/2017   Tracheobronchomalacia 06/14/2017   Dizziness 03/04/2017   Exercise hypoxemia 07/12/2016   Chronic heart failure with preserved ejection fraction ([place]) 12/25/2015   GERD (gastroesophageal reflux disease) 12/25/2015   Epidermal inclusion cyst 09/02/2015   Muscle cramps 03/06/2015   Cough 01/07/2015   Bronchiectasis without acute exacerbation ([place]) 01/07/2015   PMR (polymyalgia rheumatica) ([place]) 01/07/2015   Osteoarthritis 01/07/2015   Asthma, chronic 11/12/2014   Anxiety state 11/12/2014   Hyperparathyroidism ([place]) 10/28/2014   Fibromyalgia 09/21/2014   Personal history of ovarian cancer 08/23/2014   Osteoporosis 08/23/2014   DM2 (diabetes mellitus, type 2) ([place]) 08/02/2014   HLD (hyperlipidemia) 08/02/2014   Hypothyroidism 08/02/2014   PCP:  [name], MD Pharmacy:   [id number] [address], [place] [address] Phone: [phone] Fax: [phone]     Social Determinants of Health ([place]) Interventions    Readmission Risk Interventions Readmission Risk Prevention Plan 08/04/2020 05/09/2020  Transportation Screening Complete Complete  PCP or Specialist Appt within 5-7 Days - Complete  Home Care Screening - Complete  PCP or Specialist appointment within 3-5 days of discharge Complete -  SW Recovery Care/Counseling Consult Complete -  Palliative Care Screening Not Applicable -  [place] Not Applicable -  Some recent data might be hidden

## 2021-01-12 DIAGNOSIS — G9341 Metabolic encephalopathy: Secondary | ICD-10-CM | POA: Diagnosis not present

## 2021-01-12 LAB — GLUCOSE, CAPILLARY
Glucose-Capillary: 151 mg/dL — ABNORMAL HIGH (ref 70–99)
Glucose-Capillary: 194 mg/dL — ABNORMAL HIGH (ref 70–99)

## 2021-01-12 MED ORDER — SENNA 8.6 MG PO TABS: 1.0000 | ORAL_TABLET | Freq: Every day | ORAL | 0 refills | Status: DC | PRN

## 2021-01-12 MED ORDER — HYDROCORTISONE ACETATE 25 MG RE SUPP: 25.0000 mg | Freq: Two times a day (BID) | RECTAL | 0 refills | Status: DC

## 2021-01-12 MED ORDER — DOCUSATE SODIUM 100 MG PO CAPS: 100.0000 mg | ORAL_CAPSULE | Freq: Every day | ORAL | 0 refills | Status: DC

## 2021-01-12 MED ORDER — METRONIDAZOLE 500 MG PO TABS: 500.0000 mg | ORAL_TABLET | Freq: Two times a day (BID) | ORAL | 0 refills | Status: AC

## 2021-01-12 NOTE — Discharge Instructions (Addendum)
Follow with Primary MD [name], MD in 7 days   Get CBC, CMP  -  checked next visit within 1 week by Primary MD    Activity: As tolerated with Full fall precautions use walker/cane & assistance as needed  Disposition Home    Diet: Heart Healthy Low Carb with feeding assistance and aspiration precautions.  Accuchecks 4 times/day, Once in AM empty stomach and then before each meal. Log in all results and show them to your Prim.MD in 3 days. If any glucose reading is under 80 or above 300 call your Prim MD immidiately. Follow Low glucose instructions for glucose under 80 as instructed.   Special Instructions: If you have smoked or chewed Tobacco  in the last 2 yrs please stop smoking, stop any regular Alcohol  and or any Recreational drug use.  On your next visit with your primary care physician please Get Medicines reviewed and adjusted.  Please request your Prim.MD to go over all Hospital Tests and Procedure/Radiological results at the follow up, please get all Hospital records sent to your Prim MD by signing hospital release before you go home.  If you experience worsening of your admission symptoms, develop shortness of breath, life threatening emergency, suicidal or homicidal thoughts you must seek medical attention immediately by calling 911 or calling your MD immediately  if symptoms less severe.  You Must read complete instructions/literature along with all the possible adverse reactions/side effects for all the Medicines you take and that have been prescribed to you. Take any new Medicines after you have completely understood and accpet all the possible adverse reactions/side effects.

## 2021-01-12 NOTE — Plan of Care (Signed)
  Problem: Health Behavior/Discharge Planning: Goal: Ability to manage health-related needs will improve Outcome: Adequate for Discharge   Problem: Clinical Measurements: Goal: Ability to maintain clinical measurements within normal limits will improve Outcome: Adequate for Discharge Goal: Will remain free from infection Outcome: Adequate for Discharge Goal: Diagnostic test results will improve Outcome: Adequate for Discharge Goal: Respiratory complications will improve Outcome: Adequate for Discharge Goal: Cardiovascular complication will be avoided Outcome: Adequate for Discharge   Problem: Activity: Goal: Risk for activity intolerance will decrease Outcome: Adequate for Discharge   Problem: Nutrition: Goal: Adequate nutrition will be maintained Outcome: Adequate for Discharge   Problem: Coping: Goal: Level of anxiety will decrease Outcome: Adequate for Discharge   Problem: Elimination: Goal: Will not experience complications related to bowel motility Outcome: Adequate for Discharge Goal: Will not experience complications related to urinary retention Outcome: Adequate for Discharge   Problem: Pain Managment: Goal: General experience of comfort will improve Outcome: Adequate for Discharge   Problem: Safety: Goal: Ability to remain free from injury will improve Outcome: Adequate for Discharge   Problem: Skin Integrity: Goal: Risk for impaired skin integrity will decrease Outcome: Adequate for Discharge   Problem: Urinary Elimination: Goal: Signs and symptoms of infection will decrease Outcome: Adequate for Discharge

## 2021-01-12 NOTE — Discharge Summary (Signed)
Kayla Thompson KAJ:[id number] DOB: 1935/11/27 DOA: 01/07/2021  PCP: [name], MD  Admit date: 01/07/2021  Discharge date: 01/12/2021  Admitted From: Home   Disposition:  Home   Recommendations for Outpatient Follow-up:   Follow up with PCP in 1-2 weeks  PCP Please obtain BMP/CBC, 2 view CXR in 1week,  (see Discharge instructions)   PCP Please follow up on the following pending results:    Home Health: PT Equipment/Devices: None  Consultations: None  Discharge Condition: Stable    CODE STATUS: Full    Diet Recommendation: Heart Healthy Low Carb  CC - AMS    Brief history of present illness from the day of admission and additional interim summary    Patient is a 85 y.o. female with history of HFpEF, HTN, PAD, frequent UTIs on suppressive therapy with Macrobid, PMR on chronic steroids, COPD/asthma, DM-2, HLD-who presented with altered mental status-thought to have sepsis physiology secondary to UTI-however post hospitalization-developed diarrhea-CT abdomen showed proctitis.    Pertinent Labs/Radiology:   10/19>>Urine Culture: Insignificant growth 10/20>>Blood culture: No growth 10/21>> stool C. difficile: Negative   10/21>> CT abdomen/pelvis: Circumferential thickening of the rectum concerning for proctitis.  Thickened/irregular appearance of the posterior bladder wall.                                                                 Hospital Course    Sepsis due to proctitis/?  UTI: Sepsis physiology has resolved-leukocytosis continues to downtrend-initially thought to have UTI-however her presentation on admission may have been due to proctitis + UTI.   On 10/21-she developed lower abdominal pain/tenesmus and loose stools-subsequent CT abdomen showed proctitis.  Stool C. difficile studies  negative-GI pathogen panel ordered (but not done yet as diarrhea better). Most likely from intermittent constipation- impaction- narcotic bowel.   Previous MD reviewed the chart with GI physician Dr. Lyndel Safe, also got detailed history from patient and daughter on 01/11/2021, proctitis likely due to chronic bowel impaction and constipation.  Much improved with supportive care including rectal steroid suppository/enema, continue bowel regimen, clinically much better likely discharge on 01/12/2021, sepsis most likely due to UTI resolved, has finished her UTI RX, will be discharged on short course of oral Flagyl for proctitis along with bowel regimen and steroid suppositories for proctitis.  Also placed her on promotility agent for constipation along with stool softener, PCP to monitor constipation and bowel impaction post discharge.  Try to titrate down narcotic use .   Acute metabolic encephalopathy: Due to sepsis/UTI-improved-and now symptom-free and at baseline.   Hypothyroidism: Continue levothyroxine-TSH stable.   Polymyalgia rheumatica: Stable-continue usual regimen of prednisone.   COPD/asthma: Stable-not in exacerbation-continue bronchodilators.  Depression: Appears stable-continue Cymbalta/Elavil   Chronic pain: Resume gabapentin/baclofen.  Does have history suggestive of narcotic bowel placed on bowel regimen.  Debility/deconditioning/functional quadriplegia: Walks with the help of a walker at baseline-worsening debility likely due to acute illness-obtaining PT/OT eval. will be discharged home per her wishes she has 24/7 caregiver at home.  DM-2 (A1c 6.4 on 9/29): Continue home regimen.  A1c recently was 6.4.    Discharge diagnosis     Principal Problem:   Acute metabolic encephalopathy Active Problems:   DM2 (diabetes mellitus, type 2) (HCC)   Hypothyroidism   Asthma, chronic   PMR (polymyalgia rheumatica) (HCC)   Acute lower UTI   Sepsis (HCC)   Depression   AMS (altered  mental status)    Discharge instructions    Discharge Instructions     Diet - low sodium heart healthy   Complete by: As directed    Discharge instructions   Complete by: As directed    Follow with Primary MD Myrlene Broker, MD in 7 days   Get CBC, CMP  -  checked next visit within 1 week by Primary MD    Activity: As tolerated with Full fall precautions use walker/cane & assistance as needed  Disposition Home    Diet: Heart Healthy Low Carb diet, with feeding assistance and aspiration precautions.   Accuchecks 4 times/day, Once in AM empty stomach and then before each meal. Log in all results and show them to your Prim.MD in 3 days. If any glucose reading is under 80 or above 300 call your Prim MD immidiately. Follow Low glucose instructions for glucose under 80 as instructed.   Special Instructions: If you have smoked or chewed Tobacco  in the last 2 yrs please stop smoking, stop any regular Alcohol  and or any Recreational drug use.  On your next visit with your primary care physician please Get Medicines reviewed and adjusted.  Please request your Prim.MD to go over all Hospital Tests and Procedure/Radiological results at the follow up, please get all Hospital records sent to your Prim MD by signing hospital release before you go home.  If you experience worsening of your admission symptoms, develop shortness of breath, life threatening emergency, suicidal or homicidal thoughts you must seek medical attention immediately by calling 911 or calling your MD immediately  if symptoms less severe.  You Must read complete instructions/literature along with all the possible adverse reactions/side effects for all the Medicines you take and that have been prescribed to you. Take any new Medicines after you have completely understood and accpet all the possible adverse reactions/side effects.   Discharge wound care:   Complete by: As directed    Keep skin clean and dry    Increase activity slowly   Complete by: As directed        Discharge Medications   Allergies as of 01/12/2021       Reactions   Tape Other (See Comments)   SKIN IS VERY THIN AND DELICATE- will tear like paper!!   Lasix [furosemide] Other (See Comments)   Confusion- MD stopped this med   Latex Rash   Penicillins Rash   Has patient had a PCN reaction causing immediate rash, facial/tongue/throat swelling, SOB or lightheadedness with hypotension: No Has patient had a PCN reaction causing severe rash involving mucus membranes or skin necrosis: No Has patient had a PCN reaction that required hospitalization: No Has patient had a PCN reaction occurring within the last 10 years: No If all of the above answers are "NO", then may proceed with Cephalosporin use.        Medication List  STOP taking these medications    fluconazole 100 MG tablet Commonly known as: DIFLUCAN   furosemide 20 MG tablet Commonly known as: LASIX   magnesium oxide 400 MG tablet Commonly known as: MAG-OX       TAKE these medications    acetaminophen 650 MG CR tablet Commonly known as: TYLENOL Take 1,300 mg by mouth See admin instructions. Take 1,300 mg by mouth every 4-5 hours IN CONJUNCTION WITH one Percocet 7.5/325 tablet- for pain   albuterol 108 (90 Base) MCG/ACT inhaler Commonly known as: ProAir HFA Inhale 2 puffs into the lungs every 6 (six) hours as needed for wheezing or shortness of breath.   amitriptyline 10 MG tablet Commonly known as: ELAVIL TAKE 1 TABLET BY MOUTH EVERYDAY AT BEDTIME What changed:  how much to take how to take this when to take this additional instructions   baclofen 10 MG tablet Commonly known as: LIORESAL TAKE 0.5-1 TABLET BY MOUTH TWICE DAILY AS NEEDED FOR MUSCLE SPASMS OR PAIN What changed: See the new instructions.   BD Pen Needle Nano 2nd Gen 32G X 4 MM Misc Generic drug: Insulin Pen Needle USE AS DIRECTED 4 TIMES A DAY   busPIRone 5 MG  tablet Commonly known as: BUSPAR Take 5-10 mg by mouth daily as needed (for anxiety).   CALTRATE 600 PO Take 600 mg by mouth in the morning and at bedtime.   Centrum Silver 50+Women Tabs Take 1 tablet by mouth daily with breakfast.   cholecalciferol 25 MCG (1000 UNIT) tablet Commonly known as: VITAMIN D3 Take 2,000 Units by mouth in the morning.   clotrimazole-betamethasone cream Commonly known as: LOTRISONE APPLY TO AFFECTED AREA TWICE A DAY What changed: See the new instructions.   denosumab 60 MG/ML Sosy injection Commonly known as: PROLIA Inject 60 mg into the skin every 6 (six) months.   docusate sodium 100 MG capsule Commonly known as: COLACE Take 1 capsule (100 mg total) by mouth daily. What changed:  when to take this reasons to take this   DULoxetine 30 MG capsule Commonly known as: CYMBALTA Take 1 capsule (30 mg total) by mouth daily. What changed: when to take this   DULoxetine 60 MG capsule Commonly known as: CYMBALTA Take 1 capsule (60 mg total) by mouth daily. What changed: when to take this   estradiol 0.1 MG/GM vaginal cream Commonly known as: ESTRACE Place 1 Applicatorful vaginally See admin instructions. Takes 2 times a week   feeding supplement Liqd Take 237 mLs by mouth 2 (two) times daily between meals. What changed: when to take this   freestyle lancets Use to test blood sugar 3 times daily. Dx: E11.65   FREESTYLE LITE test strip Generic drug: glucose blood USE TO TEST BLOOD SUGAR 3 TIMES DAILY.   gabapentin 100 MG capsule Commonly known as: NEURONTIN Take 100 mg by mouth at bedtime.   hydrocortisone 2.5 % ointment Apply 1 application topically 2 (two) times daily as needed (to affected sites).   hydrocortisone 25 MG suppository Commonly known as: ANUSOL-HC Place 1 suppository (25 mg total) rectally 2 (two) times daily.   insulin glargine 100 UNIT/ML Solostar Pen Commonly known as: LANTUS Inject 5 Units into the skin in the  morning.   insulin lispro 100 UNIT/ML KwikPen Commonly known as: HumaLOG KwikPen Inject 2-4 Units into the skin daily before supper. What changed:  how much to take when to take this additional instructions   ipratropium-albuterol 0.5-2.5 (3) MG/3ML Soln Commonly known as: DUONEB USE  1 VIAL IN NEBULIZER 3 TIMES DAILY What changed: See the new instructions.   levothyroxine 50 MCG tablet Commonly known as: SYNTHROID TAKE 1 TABLET BY MOUTH EVERY DAY BEFORE BREAKFAST What changed: See the new instructions.   Lidocaine 4 % Ptch Apply 1 patch topically 2 (two) times daily as needed (pain). What changed: Another medication with the same name was removed. Continue taking this medication, and follow the directions you see here.   melatonin 1 MG Tabs tablet Take 1 mg by mouth at bedtime as needed (for sleep).   methenamine 1 g tablet Commonly known as: MANDELAMINE Take 1,000 mg by mouth in the morning and at bedtime.   metroNIDAZOLE 500 MG tablet Commonly known as: Flagyl Take 1 tablet (500 mg total) by mouth 2 (two) times daily for 4 days.   montelukast 10 MG tablet Commonly known as: SINGULAIR TAKE 1 TABLET BY MOUTH EVERY DAY What changed: when to take this   MUCINEX D MAX STRENGTH PO Take 1,200 mg by mouth in the morning and at bedtime.   Mucinex DM Maximum Strength 60-1200 MG Tb12 Take 1 tablet by mouth in the morning and at bedtime.   Mucinex Fast-Max Congest Cough 2.5-5-100 MG/5ML Liqd Generic drug: Phenylephrine-DM-GG Take 2.5-5 mLs by mouth at bedtime as needed (for coughing).   naloxone 4 MG/0.1ML Liqd nasal spray kit Commonly known as: NARCAN Place 1 spray into the nose once as needed (overdose).   nitrofurantoin (macrocrystal-monohydrate) 100 MG capsule Commonly known as: MACROBID Take 100 mg by mouth daily.   omeprazole 20 MG capsule Commonly known as: PRILOSEC TAKE 1 CAPSULE TWICE A DAY BEFORE A MEAL What changed: See the new instructions.    oxyCODONE-acetaminophen 7.5-325 MG tablet Commonly known as: PERCOCET Take 1 tablet by mouth See admin instructions. Take 1 tablet by mouth every 4-5 hours (IN CONJUNCTION WITH 1,300 mg of Tylenol)   polyethylene glycol 17 g packet Commonly known as: MIRALAX / GLYCOLAX Take 17 g by mouth daily.   predniSONE 1 MG tablet Commonly known as: DELTASONE Take 2 tablets (2 mg total) by mouth daily. What changed:  how much to take when to take this   PREVAGEN PO Take 1 tablet by mouth every evening.   rosuvastatin 10 MG tablet Commonly known as: CRESTOR Take 1 tablet (10 mg total) by mouth every evening.   senna 8.6 MG Tabs tablet Commonly known as: SENOKOT Take 1 tablet (8.6 mg total) by mouth daily as needed for moderate constipation.   SYSTANE COMPLETE OP Place 1-2 drops into both eyes 2 (two) times daily as needed (for dryness).   TART CHERRY ADVANCED PO Take 1,000 mg by mouth every evening.   Trelegy Ellipta 100-62.5-25 MCG/ACT Aepb Generic drug: Fluticasone-Umeclidin-Vilant INHALE 1 PUFF INTO THE LUNGS DAILY. RINSE MOUTH What changed: when to take this   TURMERIC PO Take 1,000 mg by mouth in the morning.   VITAMIN B 12 PO Take 2,500 mcg by mouth every Thursday.   vitamin C 1000 MG tablet Take 1,000 mg by mouth in the morning and at bedtime.               Discharge Care Instructions  (From admission, onward)           Start     Ordered   01/12/21 0000  Discharge wound care:       Comments: Keep skin clean and dry   01/12/21 0913  Follow-up Information     [facility], [facility] Follow up.   Specialty: Home Health Services Why: Home health services to continue as previously established Contact information: [address] [place] [address] [phone]         [name], MD. Schedule an appointment as soon as possible for a visit in 1 week(s).   Specialty: Internal Medicine Contact  information: [place] [address] [phone]         Werner Lean, MD .   Specialty: Cardiology Contact information: [address] [place] [address] [phone]                 Major procedures and Radiology Reports - PLEASE review detailed and final reports thoroughly  -       CT ABDOMEN PELVIS WO CONTRAST  Result Date: 01/10/2021 CLINICAL DATA:  Abdominal pain. EXAM: CT ABDOMEN AND PELVIS WITHOUT CONTRAST TECHNIQUE: Multidetector CT imaging of the abdomen and pelvis was performed following the standard protocol without IV contrast. COMPARISON:  CT abdomen pelvis dated 01/25/2019. FINDINGS: Evaluation of this exam is limited in the absence of intravenous contrast. Lower chest: There is coronary vascular calcification and calcification of the mitral annulus. Patchy area of airspace consolidation at the right lung base may represent atelectasis or infiltrate. No intra-abdominal free air or free fluid. Hepatobiliary: The liver is grossly unremarkable. No intrahepatic biliary dilatation. Layering sludge or small stones within the gallbladder. No pericholecystic fluid or evidence of acute cholecystitis by CT. Pancreas: Moderately atrophic pancreas. No acute inflammatory process. Spleen: Normal in size without focal abnormality. Adrenals/Urinary Tract: The adrenal glands unremarkable. There is no hydronephrosis or nephrolithiasis on either side. The visualized ureters are grossly unremarkable. The urinary bladder is partially distended. There is suboptimal opacification of the bladder due to streak artifact caused by right hip hardware. There is however thickened and irregular appearance of the posterior bladder wall. Cystoscopy may provide better evaluation of the bladder. A 3 mm focus of calcification along the right posterior bladder wall may represent phlebolith or less likely distal right ureteral/UVJ calculus. Stomach/Bowel: There is  circumferential thickening appearance of the rectum concerning for proctitis. No bowel obstruction. Postsurgical changes of the bowel with anastomotic suture in the right lower quadrant. Vascular/Lymphatic: Advanced aortoiliac atherosclerotic disease. The IVC is grossly unremarkable. No portal venous gas. There is no adenopathy. Reproductive: Hysterectomy. Other: None Musculoskeletal: Osteopenia with degenerative changes of the spine. Old healed right pubic bone fracture. Prior ORIF of the right femoral neck fracture. No acute osseous pathology. IMPRESSION: 1. Circumferential thickening appearance of the rectum concerning for proctitis. No bowel obstruction. 2. Thickened and irregular appearance of the posterior bladder wall. Correlation with cystoscopy recommended. 3. No hydronephrosis or nephrolithiasis. 4. Patchy area of airspace consolidation at the right lung base may represent atelectasis or infiltrate. 5. Aortic Atherosclerosis (ICD10-I70.0). Electronically Signed   By: Anner Crete M.D.   On: 01/10/2021 01:14   DG SWALLOW FUNC OP MEDICARE SPEECH PATH  Result Date: 12/23/2020 Table formatting from the original result was not included. Objective Swallowing Evaluation: Type of Study: MBS-Modified Barium Swallow Study  Patient Details Name: Kayla Thompson MRN: [id number] Date of Birth: 01-11-1936 Today's Date: 12/23/2020 Time: SLP Start Time (ACUTE ONLY): 1315 -SLP Stop Time (ACUTE ONLY): 1401 SLP Time Calculation (min) (ACUTE ONLY): 46 min Past Medical History: Past Medical History: Diagnosis Date  Anxiety   Arthritis   Asthma   Cataracts, bilateral  removed by surgery  COPD (chronic obstructive pulmonary disease) (HCC)   Depression   Diabetes mellitus without complication (HCC)   type 2  Dyspnea   with exertion  Fibromyalgia   GERD (gastroesophageal reflux disease)   History of chemotherapy   History of fractured pelvis   Hypertension   Hypothyroidism   Osteoporosis 12/2017  T score -2.8 stable from  prior DEXA  Osteoporosis   Ovarian cancer ([place])   Pneumonia   several times Past Surgical History: Past Surgical History: Procedure Laterality Date  ABDOMINAL HYSTERECTOMY  1996  ANKLE FRACTURE SURGERY    plate and screws  BIOPSY  02/05/2019  Procedure: BIOPSY;  Surgeon: Irving Copas., MD;  Location: [facility] ENDOSCOPY;  Service: Gastroenterology;;  BLADDER SUSPENSION    CATARACT EXTRACTION    COLONOSCOPY    ESOPHAGEAL BRUSHING  02/05/2019  Procedure: ESOPHAGEAL BRUSHING;  Surgeon: Irving Copas., MD;  Location: [facility] ENDOSCOPY;  Service: Gastroenterology;;  ESOPHAGOGASTRODUODENOSCOPY (EGD) WITH PROPOFOL N/A 02/05/2019  Procedure: ESOPHAGOGASTRODUODENOSCOPY (EGD) WITH PROPOFOL with dialtion;  Surgeon: Irving Copas., MD;  Location: [facility];  Service: Gastroenterology;  Laterality: N/A;  EYE SURGERY    cataracts removed-bilateral  hip surgey    knee surgey    LUNG SURGERY    OOPHORECTOMY    BSO  SAVORY DILATION N/A 02/05/2019  Procedure: SAVORY DILATION;  Surgeon: Irving Copas., MD;  Location: [facility];  Service: Gastroenterology;  Laterality: N/A;  TONSILLECTOMY    UPPER GI ENDOSCOPY   HPI: 85 yo female referred for MBS by Dr Cathie Olden, Cardiology.  Pt with PMH + for COPD, acid reflux, ovarian cancer s/p chemo, unintentional weight loss, anxiety, fibromyalgia and bronchiectasis.  Pt with is s/p endoscopy 01/2019 with findings of potential candida, erythematous mucosal in the atrum, dilation performed of the entire esophagus, mucosal rent noted at UES, 4 cm hiatal hernia.  Pt's family describes pt coughing with po intake sometimes.  She takes her medications with applesauce due to it being easier for her to swallow.  Subjective: pt arrived with her daughter and pt's caregiver Assessment / Plan / Recommendation CHL IP CLINICAL IMPRESSIONS 12/23/2020 Clinical Impression Patient presents with mild oropharyngeal dysphagia c/b decreased oral coordination with transiting liquids boluses  and impaired laryngeal closure allowing laryngeal penetration of thin during the swallow. Trace aspiration noted of secretions and barium after the swallow without cough responses.  Cued cough removed trace penetrates/aspirates but pharyngeal secretions remained.  Pt also tends to take very large liquid boluses and allows spillage of liquids into pharynx - not well controlled.   Chin tuck posture with use of straw prevented laryngeal penetration with 2 thin boluses early in the study and later and decreased penetration to trace.  Oral retention with liquids spilled into pharynx uncontrolled and is inconsistently penetrated.  Pt's oropharyngeal swallow with solids was normal (puree/cracker) - with swallow triggering at vallecular space and adequate pharyngeal clearance.   Recommend pt continue diet as tolerated, implementing chin tuck posture with liquids to mitigate aspiration.  Small bolus size advised to decrease aspiration.  Silent nature of aspiration reviewed as well as larger amount of aspiration likely if pt overtly coughing with liquids.  Water with her meals for airway protection advised due to pH neutral status of water.  Advised pt use a straw for ease of motor planning with chin tuck posture however her daughter advised that GI advised pt not to use a straw- therefore defer to GI.  Recommend brief follow up with [facility] SLP  for dysphagia management, swallowing exercises- HEP establishment, etc. SLP Visit Diagnosis Dysphagia, oropharyngeal phase (R13.12) Attention and concentration deficit following -- Frontal lobe and executive function deficit following -- Impact on safety and function Moderate aspiration risk   CHL IP TREATMENT RECOMMENDATION 01/26/2019 Treatment Recommendations No treatment recommended at this time   No flowsheet data found. CHL IP DIET RECOMMENDATION 12/23/2020 SLP Diet Recommendations Regular solids;Thin liquid Liquid Administration via Cup;Straw Medication Administration Whole meds with  puree Compensations Slow rate;Small sips/bites;Chin tuck Postural Changes Remain semi-upright after after feeds/meals (Comment);Seated upright at 90 degrees   CHL IP OTHER RECOMMENDATIONS 01/26/2019 Recommended Consults -- Oral Care Recommendations -- Other Recommendations Clarify dietary restrictions   CHL IP FOLLOW UP RECOMMENDATIONS 08/04/2020 Follow up Recommendations None;24 hour supervision/assistance   No flowsheet data found.     CHL IP ORAL PHASE 12/23/2020 Oral Phase Impaired Oral - Pudding Teaspoon -- Oral - Pudding Cup -- Oral - Honey Teaspoon -- Oral - Honey Cup -- Oral - Nectar Teaspoon -- Oral - Nectar Cup Premature spillage;Piecemeal swallowing;Decreased bolus cohesion Oral - Nectar Straw WFL;Premature spillage Oral - Thin Teaspoon WFL;Premature spillage Oral - Thin Cup Premature spillage;Piecemeal swallowing;Decreased bolus cohesion;Lingual/palatal residue Oral - Thin Straw Premature spillage;Piecemeal swallowing;Lingual/palatal residue Oral - Puree WFL Oral - Mech Soft Impaired mastication Oral - Regular -- Oral - Multi-Consistency -- Oral - Pill -- Oral Phase - Comment vertical mastication pattern, pt takes large sequential boluses of liquids - spilling portion of bolus into pharynx - small single boluses tolerated better  CHL IP PHARYNGEAL PHASE 12/23/2020 Pharyngeal Phase Impaired Pharyngeal- Pudding Teaspoon -- Pharyngeal -- Pharyngeal- Pudding Cup -- Pharyngeal -- Pharyngeal- Honey Teaspoon -- Pharyngeal -- Pharyngeal- Honey Cup -- Pharyngeal -- Pharyngeal- Nectar Teaspoon -- Pharyngeal -- Pharyngeal- Nectar Cup [facility] Pharyngeal Material does not enter airway Pharyngeal- Nectar Straw WFL Pharyngeal Material does not enter airway Pharyngeal- Thin Teaspoon WFL Pharyngeal Material does not enter airway Pharyngeal- Thin Cup Reduced laryngeal elevation;Reduced airway/laryngeal closure;Penetration/Aspiration during swallow Pharyngeal Material enters airway, passes BELOW cords without attempt by patient to  eject out (silent aspiration);Material enters airway, CONTACTS cords and not ejected out Pharyngeal- Thin Straw Reduced laryngeal elevation;Reduced airway/laryngeal closure;Penetration/Aspiration during swallow Pharyngeal Material enters airway, passes BELOW cords without attempt by patient to eject out (silent aspiration);Material enters airway, CONTACTS cords and not ejected out Pharyngeal- Puree WFL;Delayed swallow initiation-vallecula Pharyngeal Material does not enter airway Pharyngeal- Mechanical Soft WFL;Delayed swallow initiation-vallecula Pharyngeal Material does not enter airway Pharyngeal- Regular -- Pharyngeal -- Pharyngeal- Multi-consistency -- Pharyngeal -- Pharyngeal- Pill -- Pharyngeal -- Pharyngeal Comment Chin tuck posture helpful to minimize laryngeal penetration, pt with trace aspiration of thin and secretions during and after the swallow, NO cough response noted  CHL IP CERVICAL ESOPHAGEAL PHASE 12/23/2020 Cervical Esophageal Phase ? Appearance of cervical esophageal web type feature at C4-C5 on film series #3 112/147, which was viewed upon review of MBS.   Pudding Teaspoon -- Pudding Cup -- Honey Teaspoon -- Honey Cup -- Nectar Teaspoon -- Nectar Cup -- Nectar Straw -- Thin Teaspoon -- Thin Cup -- Thin Straw -- Puree -- Mechanical Soft -- Regular -- Multi-consistency -- Pill -- Cervical Esophageal Comment -- Kathleen Lime, MS [facility] SLP Acute Rehab Services Office [phone] Pager [phone] Macario Golds 12/23/2020, 3:02 PM  CLINICAL DATA:  Dysphagia. EXAM: MODIFIED BARIUM SWALLOW TECHNIQUE: Different consistencies of barium were administered orally to the patient by the Speech Pathologist. Imaging of the pharynx was performed in the lateral projection. The radiologist was present in  the fluoroscopy room for this study, providing personal supervision. FLUOROSCOPY TIME:  Fluoroscopy Time:  2 minutes, 18 seconds Radiation Exposure Index (if provided by the fluoroscopic device): 3.7 mGy Number of  Acquired Spot Images: 0 COMPARISON:  None. FINDINGS: Thin liquid: Episodes of penetration including penetration to the cords. Initially with chin-tuck this look mildly improved. There were some episodes of flash penetration with head neutral straw swallowing. Nectar: Unremarkable Puree: Unremarkable Solid: Unremarkable IMPRESSION: 1. Episodes of laryngeal penetration with thin liquids. Please refer to the Speech Pathologists report for complete details and recommendations. Electronically Signed   By: Van Clines M.D.   On: 12/23/2020 14:21   DG Abd Portable 1V  Result Date: 01/11/2021 CLINICAL DATA:  85 year old female with history of constipation. EXAM: PORTABLE ABDOMEN - 1 VIEW COMPARISON:  CT abdomen pelvis from 11/10/2020 FINDINGS: Nonspecific bowel gas pattern. No significant colonic or small bowel gaseous distension. No abnormal stool burden. Multiple surgical clips in the lower abdomen and pelvis. Partially visualized right hip fixation hardware. Multilevel degenerative changes of the lumbar spine. IMPRESSION: Nonspecific bowel gas pattern.  Normal stool burden. Electronically Signed   By: Ruthann Cancer M.D.   On: 01/11/2021 11:34    Today   Subjective    Verniece Encarnacion today has no headache,no chest abdominal pain,no new weakness tingling or numbness, feels much better wants to go home today.     Objective   Blood pressure (!) 151/79, pulse 77, temperature 98 F (36.7 C), temperature source Oral, resp. rate 18, height $RemoveBe'4\' 11"'heZUMIrgO$  (1.499 m), weight 48.3 kg, SpO2 98 %.   Intake/Output Summary (Last 24 hours) at 01/12/2021 0914 Last data filed at 01/12/2021 0606 Gross per 24 hour  Intake 233.66 ml  Output 300 ml  Net -66.34 ml    Exam  Awake Alert, No new F.N deficits, Normal affect [place].AT,PERRAL Supple Neck,No JVD, No cervical lymphadenopathy appriciated.  Symmetrical Chest wall movement, Good air movement bilaterally, CTAB RRR,No Gallops,Rubs or new Murmurs, No Parasternal  Heave +ve B.Sounds, Abd Soft, Non tender, No organomegaly appriciated, No rebound -guarding or rigidity. No Cyanosis, Clubbing or edema, No new Rash or bruise   Data Review   CBC w Diff:  Lab Results  Component Value Date   WBC 12.8 (H) 01/11/2021   HGB 8.9 (L) 01/11/2021   HGB 11.7 11/28/2017   HCT 27.8 (L) 01/11/2021   HCT 36.9 11/28/2017   PLT 273 01/11/2021   PLT 304 11/28/2017   LYMPHOPCT 17 01/07/2021   BANDSPCT  01/12/2020    QUESTIONABLE RESULTS, RECOMMEND RECOLLECT TO VERIFY   MONOPCT 4 01/07/2021   EOSPCT 2 01/07/2021   BASOPCT 0 01/07/2021    CMP:  Lab Results  Component Value Date   NA 132 (L) 01/11/2021   NA 140 11/26/2020   K 3.5 01/11/2021   CL 98 01/11/2021   CO2 26 01/11/2021   BUN 13 01/11/2021   BUN 30 (H) 11/26/2020   CREATININE 0.90 01/11/2021   CREATININE 0.94 (H) 12/07/2019   PROT 6.1 (L) 01/07/2021   ALBUMIN 3.1 (L) 01/07/2021   BILITOT 0.8 01/07/2021   ALKPHOS 50 01/07/2021   AST 20 01/07/2021   ALT 20 01/07/2021  .   Total Time in preparing paper work, data evaluation and todays exam - 18 minutes  Lala Lund M.D on 01/12/2021 at 9:14 AM  [facility]

## 2021-01-12 NOTE — TOC Transition Note (Signed)
Transition of Care [facility]) - CM/SW Discharge Note   Patient Details  Name: Kayla Thompson MRN: [id number] Date of Birth: Jan 16, 1936  Transition of Care [facility]) CM/SW Contact:  Cyndi Bender, RN Phone Number: 01/12/2021, 9:29 AM   Clinical Narrative:   Patient stable for discharge. HHPT was setup with [facility] yesterday. Notified Tommi Rumps with [facility] of patient discharge today.  Family or aide will transport patient home.   Final next level of care: [place] Barriers to Discharge: Barriers Resolved   Patient Goals and CMS Choice Patient states their goals for this hospitalization and ongoing recovery are:: to go home CMS Medicare.gov Compare Post Acute Care list provided to:: Other (Comment Required) Choice offered to / list presented to : Adult Children  Discharge Placement             Home with [place]        Discharge Plan and Services   Discharge Planning Services: CM Consult Post Acute Care Choice: Home Health          DME Arranged: N/A         HH Arranged: PT HH Agency: [place] Date [facility] Agency Contacted: 01/12/21 Time [place]: [phone] Representative spoke with at [place]: [place] ([place]) Interventions     Readmission Risk Interventions Readmission Risk Prevention Plan 08/04/2020 05/09/2020  Transportation Screening Complete Complete  PCP or Specialist Appt within 5-7 Days - Complete  Home Care Screening - Complete  PCP or Specialist appointment within 3-5 days of discharge Complete -  SW Recovery Care/Counseling Consult Complete -  [place] Not Applicable -  Some recent data might be hidden

## 2021-01-12 NOTE — Progress Notes (Signed)
Occupational Therapy Treatment Patient Details Name: Kayla Thompson MRN: [id number] DOB: [birth year] Today's Date: 01/12/2021   History of present illness 85 yo female with onset of AMS was brought to hosp to admit 10/19.  Overnight was confused, N&V, and had dysuria at some point.  Noted acute metabolic encephalopathy, sepsis.  PMHx:  CHF, PAD, HTN, UTI, PMR on steroids, asthma, granulomatous lung disease, COPD, hypothyroidism, DM, GERD, HLD, cognitive impairment.   OT comments  Pt  present in bed and caregiver at bed sie. Pt's caregiver reports 24/7 assist with the return home. Pt required supine to sitting and sitting to supine with max assist. Pt was able to sit at EOB for about 10 mins while self feeding with hand over hand. Pt noted in session following warming up hands o2 on room air went from 64-90s. It was noted with any movement required cues on breathing and following coughing in session. Pt's caregiver reported pt has a concentrator and portable tanks in home. Pt currently with functional limitations due to the deficits listed below (see OT Problem List).  Pt will benefit from skilled OT to increase their safety and independence with ADL and functional mobility for ADL to facilitate discharge to venue listed below.     Recommendations for follow up therapy are one component of a multi-disciplinary discharge planning process, led by the attending physician.  Recommendations may be updated based on patient status, additional functional criteria and insurance authorization.    Follow Up Recommendations  Home health OT (Pt request to go home)    Assistance Recommended at Discharge Frequent or constant Supervision/Assistance  Equipment Recommendations  None recommended by OT    Recommendations for Other Services      Precautions / Restrictions Precautions Precautions: Fall Precaution Comments: MONITOR SATS while moving Restrictions Weight Bearing Restrictions: No        Mobility Bed Mobility Overal bed mobility: Needs Assistance Bed Mobility: Supine to Sit;Sit to Supine Rolling: Max assist   Supine to sit: Max assist Sit to supine: Mod assist   General bed mobility comments: Pt needed cues to engage in bed mobility and activity    Transfers                   General transfer comment: Pt at this time limited particiaption due to o2 status     Balance Overall balance assessment: Needs assistance Sitting-balance support: Feet supported Sitting balance-Leahy Scale: Fair Sitting balance - Comments: Pt when first sitting at EOB required mod assist then to min guard Postural control: Posterior lean                                 ADL either performed or assessed with clinical judgement   ADL Overall ADL's : Needs assistance/impaired Eating/Feeding: Set up;Cueing for safety;Cueing for sequencing;Sitting   Grooming: Wash/dry hands;Wash/dry face;Moderate assistance;Cueing for safety;Cueing for sequencing;Sitting   Upper Body Bathing: Moderate assistance;Cueing for safety;Cueing for sequencing;Sitting   Lower Body Bathing: Maximal assistance;Cueing for safety;Cueing for sequencing;Bed level   Upper Body Dressing : Moderate assistance;Cueing for safety;Cueing for sequencing;Sitting   Lower Body Dressing: Maximal assistance;Cueing for safety;Cueing for sequencing;Bed level                 General ADL Comments: Has 24 hour coverage for ADL/IADL assist as needed.     Vision       Perception     Praxis  Cognition Arousal/Alertness: Awake/alert Behavior During Therapy: Flat affect Overall Cognitive Status:  (Hx of impairments but caregiver did not express chnages)                     Current Attention Level: Selective Memory: Decreased short-term memory Following Commands: Follows one step commands consistently     Problem Solving: Slow processing            Exercises Exercises: General  Upper Extremity General Exercises - Upper Extremity Shoulder Flexion: PROM;Both;5 reps;Seated   Shoulder Instructions       General Comments      Pertinent Vitals/ Pain       Pain Assessment: Faces Faces Pain Scale: Hurts little more Pain Location: BLE-pt reports chronic pain due to rotator cuff Pain Descriptors / Indicators: Grimacing;Guarding;Aching Pain Intervention(s): Limited activity within patient's tolerance;Monitored during session  Home Living                                          Prior Functioning/Environment              Frequency  Min 2X/week        Progress Toward Goals  OT Goals(current goals can now be found in the care plan section)  Progress towards OT goals: Progressing toward goals  Acute Rehab OT Goals OT Goal Formulation: With patient Time For Goal Achievement: 01/23/21 Potential to Achieve Goals: Good ADL Goals Pt Will Perform Grooming: with supervision;sitting Pt Will Perform Upper Body Bathing: with supervision;sitting Pt Will Perform Upper Body Dressing: with supervision;sitting Pt Will Transfer to Toilet: ambulating;regular height toilet;with min assist  Plan Discharge plan remains appropriate    Co-evaluation                 AM-PAC OT "6 Clicks" Daily Activity     Outcome Measure   Help from another person eating meals?: A Lot Help from another person taking care of personal grooming?: A Lot Help from another person toileting, which includes using toliet, bedpan, or urinal?: A Lot Help from another person bathing (including washing, rinsing, drying)?: A Lot Help from another person to put on and taking off regular upper body clothing?: A Lot Help from another person to put on and taking off regular lower body clothing?: A Lot 6 Click Score: 12    End of Session    OT Visit Diagnosis: Unsteadiness on feet (R26.81);Other symptoms and signs involving cognitive function   Activity Tolerance Patient  limited by fatigue   Patient Left in bed;with call bell/phone within reach;with bed alarm set;with nursing/sitter in room   Nurse Communication  (o2 status)        Time: 7253-6644 OT Time Calculation (min): 36 min  Charges: OT General Charges $OT Visit: 1 Visit OT Treatments $Self Care/Home Management : 23-37 mins  Joeseph Amor OTR/L  Acute Rehab Services  [phone] office number [phone] pager number   Joeseph Amor 01/12/2021, 9:55 AM

## 2021-01-13 ENCOUNTER — Ambulatory Visit: Payer: Medicare Other | Admitting: Podiatry

## 2021-01-13 LAB — CULTURE, BLOOD (ROUTINE X 2)
Culture: NO GROWTH
Culture: NO GROWTH
Special Requests: ADEQUATE
Special Requests: ADEQUATE

## 2021-01-15 DIAGNOSIS — I152 Hypertension secondary to endocrine disorders: Secondary | ICD-10-CM | POA: Diagnosis not present

## 2021-01-15 DIAGNOSIS — J449 Chronic obstructive pulmonary disease, unspecified: Secondary | ICD-10-CM | POA: Diagnosis not present

## 2021-01-15 DIAGNOSIS — I5032 Chronic diastolic (congestive) heart failure: Secondary | ICD-10-CM | POA: Diagnosis not present

## 2021-01-15 DIAGNOSIS — E43 Unspecified severe protein-calorie malnutrition: Secondary | ICD-10-CM | POA: Diagnosis not present

## 2021-01-15 DIAGNOSIS — M353 Polymyalgia rheumatica: Secondary | ICD-10-CM | POA: Diagnosis not present

## 2021-01-15 DIAGNOSIS — E1159 Type 2 diabetes mellitus with other circulatory complications: Secondary | ICD-10-CM | POA: Diagnosis not present

## 2021-01-19 ENCOUNTER — Telehealth: Payer: Self-pay | Admitting: Internal Medicine

## 2021-01-19 DIAGNOSIS — J471 Bronchiectasis with (acute) exacerbation: Secondary | ICD-10-CM

## 2021-01-19 MED ORDER — IPRATROPIUM-ALBUTEROL 0.5-2.5 (3) MG/3ML IN SOLN: 3.0000 mL | RESPIRATORY_TRACT | 11 refills | Status: DC | PRN

## 2021-01-19 MED ORDER — IPRATROPIUM-ALBUTEROL 0.5-2.5 (3) MG/3ML IN SOLN: 3.0000 mL | RESPIRATORY_TRACT | 0 refills | Status: DC | PRN

## 2021-01-19 NOTE — Telephone Encounter (Signed)
I have called and talked to the daughter to let her know that I have sent some of the solution to the local pharmacy to get refilled. She did not have any other questions.

## 2021-01-19 NOTE — Telephone Encounter (Addendum)
Patient is needing refill on albuterol nebulizer solution. Patient is completely out and wants to know if one refill can be sent to local pharmacy, [facility] on [address] so she does not have to wait and the other refills sent to [place].  Call back is [phone]   Please advise.

## 2021-01-21 DIAGNOSIS — E43 Unspecified severe protein-calorie malnutrition: Secondary | ICD-10-CM | POA: Diagnosis not present

## 2021-01-21 DIAGNOSIS — J449 Chronic obstructive pulmonary disease, unspecified: Secondary | ICD-10-CM | POA: Diagnosis not present

## 2021-01-21 DIAGNOSIS — I152 Hypertension secondary to endocrine disorders: Secondary | ICD-10-CM | POA: Diagnosis not present

## 2021-01-21 DIAGNOSIS — M353 Polymyalgia rheumatica: Secondary | ICD-10-CM | POA: Diagnosis not present

## 2021-01-21 DIAGNOSIS — I5032 Chronic diastolic (congestive) heart failure: Secondary | ICD-10-CM | POA: Diagnosis not present

## 2021-01-21 DIAGNOSIS — E1159 Type 2 diabetes mellitus with other circulatory complications: Secondary | ICD-10-CM | POA: Diagnosis not present

## 2021-01-23 ENCOUNTER — Telehealth: Payer: Self-pay | Admitting: Internal Medicine

## 2021-01-23 DIAGNOSIS — I5032 Chronic diastolic (congestive) heart failure: Secondary | ICD-10-CM | POA: Diagnosis not present

## 2021-01-23 DIAGNOSIS — E43 Unspecified severe protein-calorie malnutrition: Secondary | ICD-10-CM | POA: Diagnosis not present

## 2021-01-23 DIAGNOSIS — I152 Hypertension secondary to endocrine disorders: Secondary | ICD-10-CM | POA: Diagnosis not present

## 2021-01-23 DIAGNOSIS — M353 Polymyalgia rheumatica: Secondary | ICD-10-CM | POA: Diagnosis not present

## 2021-01-23 DIAGNOSIS — J449 Chronic obstructive pulmonary disease, unspecified: Secondary | ICD-10-CM | POA: Diagnosis not present

## 2021-01-23 DIAGNOSIS — E1159 Type 2 diabetes mellitus with other circulatory complications: Secondary | ICD-10-CM | POA: Diagnosis not present

## 2021-01-23 NOTE — Telephone Encounter (Signed)
Marland Kitchen informing provider she received an alert w/ patients' pain level being 8/10  No appointment requested at this time

## 2021-01-23 NOTE — Telephone Encounter (Signed)
See below

## 2021-01-26 ENCOUNTER — Telehealth: Payer: Self-pay | Admitting: Internal Medicine

## 2021-01-26 NOTE — Telephone Encounter (Signed)
Pt. Daughter has called and states that [facility] has faxed over request for medical records for about a month now. Would like for records to be faxed to [phone] or emailed at [email or web address].   Callback #- [phone]( [place])

## 2021-01-27 ENCOUNTER — Ambulatory Visit (INDEPENDENT_AMBULATORY_CARE_PROVIDER_SITE_OTHER): Payer: Medicare Other | Admitting: Podiatry

## 2021-01-27 ENCOUNTER — Other Ambulatory Visit: Payer: Self-pay

## 2021-01-27 ENCOUNTER — Encounter: Payer: Self-pay | Admitting: Podiatry

## 2021-01-27 DIAGNOSIS — B351 Tinea unguium: Secondary | ICD-10-CM | POA: Diagnosis not present

## 2021-01-27 DIAGNOSIS — M79675 Pain in left toe(s): Secondary | ICD-10-CM | POA: Diagnosis not present

## 2021-01-27 DIAGNOSIS — M79674 Pain in right toe(s): Secondary | ICD-10-CM | POA: Diagnosis not present

## 2021-01-27 NOTE — Progress Notes (Signed)
This patient returns to my office for at risk foot care.  This patient requires this care by a professional since this patient will be at risk due to having type 2 DM, and PAD.  This patient is unable to cut nails herself since the patient cannot reach her nails.These nails are painful walking and wearing shoes.  This patient presents for at risk foot care today.  General Appearance  Alert, conversant and in no acute stress.  Vascular  Dorsalis pedis and posterior tibial  pulses are  weakly palpable  bilaterally.  Capillary return is within normal limits  bilaterally. Cold feet bilaterally. Purplish feet noted.  Neurologic  Senn-Weinstein monofilament wire test within normal limits  bilaterally. Muscle power within normal limits bilaterally.  Nails Thick disfigured discolored nails with subungual debris  from hallux to fifth toes bilaterally. No evidence of bacterial infection or drainage bilaterally.  Subungual hematoma right hallux.  Orthopedic  No limitations of motion  feet .  No crepitus or effusions noted.  Mild  HAV with hammer toes 2-5  B/L.  Rigid ht second right.  Skin  normotropic skin with no porokeratosis noted bilaterally.  No signs of infections or ulcers noted.     Onychomycosis  Pain in right toes  Pain in left toes  Consent was obtained for treatment procedures.   Mechanical debridement of nails 1-5  bilaterally performed with a nail nipper.  Filed with dremel without incident. Padding dispensed to pad rigid  HT.   Return office visit    3 months                  Told patient to return for periodic foot care and evaluation due to potential at risk complications.   Gardiner Barefoot DPM

## 2021-01-28 ENCOUNTER — Telehealth (INDEPENDENT_AMBULATORY_CARE_PROVIDER_SITE_OTHER): Payer: Medicare Other | Admitting: Internal Medicine

## 2021-01-28 DIAGNOSIS — D509 Iron deficiency anemia, unspecified: Secondary | ICD-10-CM | POA: Diagnosis not present

## 2021-01-28 DIAGNOSIS — I152 Hypertension secondary to endocrine disorders: Secondary | ICD-10-CM

## 2021-01-28 DIAGNOSIS — E1159 Type 2 diabetes mellitus with other circulatory complications: Secondary | ICD-10-CM | POA: Diagnosis not present

## 2021-01-28 DIAGNOSIS — F3341 Major depressive disorder, recurrent, in partial remission: Secondary | ICD-10-CM | POA: Diagnosis not present

## 2021-01-28 DIAGNOSIS — E43 Unspecified severe protein-calorie malnutrition: Secondary | ICD-10-CM | POA: Diagnosis not present

## 2021-01-28 MED ORDER — DOCUSATE SODIUM 100 MG PO CAPS: 100.0000 mg | ORAL_CAPSULE | Freq: Every day | ORAL | 3 refills | Status: DC

## 2021-01-28 MED ORDER — SENNA 8.6 MG PO TABS: 1.0000 | ORAL_TABLET | Freq: Every day | ORAL | 3 refills | Status: DC | PRN

## 2021-01-28 MED ORDER — MIRTAZAPINE 15 MG PO TABS: 15.0000 mg | ORAL_TABLET | Freq: Every day | ORAL | 3 refills | Status: DC

## 2021-01-28 NOTE — Progress Notes (Signed)
Virtual Visit via Video Note  I connected with Kayla Thompson on 01/28/21 at 11:00 AM EST by a video enabled telemedicine application and verified that I am speaking with the correct person using two identifiers.  The patient and the provider were at separate locations throughout the entire encounter. Patient location: home, Provider location: work   I discussed the limitations of evaluation and management by telemedicine and the availability of in person appointments. The patient expressed understanding and agreed to proceed. The patient and the provider were the only parties present for the visit unless noted in HPI below.  History of Present Illness: The patient is a 85 y.o. female with visit for hospital follow up. In for proctitis and possible UTI. She is feeling improved but still tired. Appetite low and losing weight still. Daughter present and helps to provide history.   PMH, [facility], social history reviewed and updated.   Observations/Objective: Appearance: lying in bed, breathing appears normal, casual grooming  Assessment and Plan: See problem oriented charting.  Follow Up Instructions: rx remeron, stop elavil, refill docusate and senokot  I discussed the assessment and treatment plan with the patient. The patient was provided an opportunity to ask questions and all were answered. The patient agreed with the plan and demonstrated an understanding of the instructions.   The patient was advised to call back or seek an in-person evaluation if the symptoms worsen or if the condition fails to improve as anticipated.  [name], MD

## 2021-01-29 DIAGNOSIS — E43 Unspecified severe protein-calorie malnutrition: Secondary | ICD-10-CM | POA: Diagnosis not present

## 2021-01-29 DIAGNOSIS — M353 Polymyalgia rheumatica: Secondary | ICD-10-CM | POA: Diagnosis not present

## 2021-01-29 DIAGNOSIS — I5032 Chronic diastolic (congestive) heart failure: Secondary | ICD-10-CM | POA: Diagnosis not present

## 2021-01-29 DIAGNOSIS — J449 Chronic obstructive pulmonary disease, unspecified: Secondary | ICD-10-CM | POA: Diagnosis not present

## 2021-01-29 DIAGNOSIS — I152 Hypertension secondary to endocrine disorders: Secondary | ICD-10-CM | POA: Diagnosis not present

## 2021-01-29 DIAGNOSIS — E1159 Type 2 diabetes mellitus with other circulatory complications: Secondary | ICD-10-CM | POA: Diagnosis not present

## 2021-01-29 NOTE — Telephone Encounter (Signed)
Called pt's. LVM letting her know her request was sent to medical records and left her medical records contact information. Mychart message was sent as well.

## 2021-01-30 ENCOUNTER — Encounter: Payer: Self-pay | Admitting: Internal Medicine

## 2021-01-30 DIAGNOSIS — Z79899 Other long term (current) drug therapy: Secondary | ICD-10-CM | POA: Diagnosis not present

## 2021-01-30 DIAGNOSIS — M545 Low back pain, unspecified: Secondary | ICD-10-CM | POA: Diagnosis not present

## 2021-01-30 NOTE — Assessment & Plan Note (Signed)
Stop elavil 10 mg qhs and start remeron 15 mg qhs. They will contact us in 2-3 weeks with how this is working.

## 2021-01-30 NOTE — Assessment & Plan Note (Signed)
She continues to lose weight and we are starting remeron 15 mg qhs to help appetite and they are aware about high calorie and ensure to help.

## 2021-01-30 NOTE — Assessment & Plan Note (Signed)
Most recent labs from the hospital reviewed and stable. No repeat today.

## 2021-01-30 NOTE — Assessment & Plan Note (Signed)
BP in hospital at goal and she has been okay since getting home. As she is able to eat more normally we will need to monitor closely. Not taking BP medication currently.

## 2021-02-03 DIAGNOSIS — J449 Chronic obstructive pulmonary disease, unspecified: Secondary | ICD-10-CM | POA: Diagnosis not present

## 2021-02-03 DIAGNOSIS — I5032 Chronic diastolic (congestive) heart failure: Secondary | ICD-10-CM | POA: Diagnosis not present

## 2021-02-03 DIAGNOSIS — E43 Unspecified severe protein-calorie malnutrition: Secondary | ICD-10-CM | POA: Diagnosis not present

## 2021-02-03 DIAGNOSIS — E1159 Type 2 diabetes mellitus with other circulatory complications: Secondary | ICD-10-CM | POA: Diagnosis not present

## 2021-02-03 DIAGNOSIS — M353 Polymyalgia rheumatica: Secondary | ICD-10-CM | POA: Diagnosis not present

## 2021-02-03 DIAGNOSIS — I152 Hypertension secondary to endocrine disorders: Secondary | ICD-10-CM | POA: Diagnosis not present

## 2021-02-05 DIAGNOSIS — I152 Hypertension secondary to endocrine disorders: Secondary | ICD-10-CM | POA: Diagnosis not present

## 2021-02-05 DIAGNOSIS — J449 Chronic obstructive pulmonary disease, unspecified: Secondary | ICD-10-CM | POA: Diagnosis not present

## 2021-02-05 DIAGNOSIS — I5032 Chronic diastolic (congestive) heart failure: Secondary | ICD-10-CM | POA: Diagnosis not present

## 2021-02-05 DIAGNOSIS — E43 Unspecified severe protein-calorie malnutrition: Secondary | ICD-10-CM | POA: Diagnosis not present

## 2021-02-05 DIAGNOSIS — M353 Polymyalgia rheumatica: Secondary | ICD-10-CM | POA: Diagnosis not present

## 2021-02-05 DIAGNOSIS — E1159 Type 2 diabetes mellitus with other circulatory complications: Secondary | ICD-10-CM | POA: Diagnosis not present

## 2021-02-07 ENCOUNTER — Other Ambulatory Visit: Payer: Self-pay | Admitting: Internal Medicine

## 2021-02-15 ENCOUNTER — Other Ambulatory Visit: Payer: Self-pay | Admitting: Internal Medicine

## 2021-02-15 DIAGNOSIS — J471 Bronchiectasis with (acute) exacerbation: Secondary | ICD-10-CM

## 2021-02-19 ENCOUNTER — Other Ambulatory Visit: Payer: Self-pay | Admitting: Internal Medicine

## 2021-02-24 ENCOUNTER — Telehealth: Payer: Self-pay | Admitting: Internal Medicine

## 2021-02-24 NOTE — Telephone Encounter (Signed)
Almyra Free from [facility] in  [place] says patient has applied to be a resident at their facility & she faxed over some forms requesting medical info regarding patient on 11/29  Please confirm if forms have been received & call Almyra Free back [phone]

## 2021-02-25 NOTE — Progress Notes (Signed)
We will repeatHPI F never smoker followed for asthma/ COPD/ Tracheobronchomalacia,Bronchiectasis, Granulomatous lung disease,  Chronic Hypoxic Resp Failure,  GERD, complicated by  DM2, Hypothyroid, HBP, dCHF Office Spirometry 01/07/15- Mild obstructive airways disease- FVC 7.2/ 100%, FEV1 1.4/ 83%, FEV1/FVC 0.62, FEF25-75% 44% PFT 02/26/2019- Mild obstruction, Diffusion moderately reduced, no resp to BD, ----------------------------------------------------------------------------------------- .  02/28/20-  84 yoF never smoker followed for asthma/ COPD/ Tracheobronchomalacia, Bronchiectasis, Granulomatous lung disease,  GERD, complicated by  DM2, Hypothyroid, HBP, dCHF, GERD, Chronic UTI, Chronic dCHF,  O2 2L [employer],  -Rescue inhaler 1-2x/ week, Trelegy 100 and maintenance prednisone 2mg ( 1 mg x 2), Nebulizer Duoneb. Singulair, Mucinex DM,  Covid vax- 2 Phizer Flu vax- had Hosp 10/26 - confusion due to E.coli UTI sepsis,   DC to SNF -----Patient has productive cough with yellow sputum that was noticed today, shortness of breath with exertion.  Has not been using home O2 lately.  Discussed Covax booster- recommended. CXR 01/28/20- IMPRESSION: Stable scarring right base. No edema or airspace opacity. Stable cardiac silhouette. Old healed rib fractures. Aortic Atherosclerosis (ICD10-I70.0).  02/26/21- 85 yoF never smoker followed for asthma/ COPD/ Tracheobronchomalacia, Bronchiectasis, Granulomatous lung disease,  GERD, complicated by  DM2, Hypothyroid, HBP, dCHF, GERD, Chronic UTI, Chronic dCHF, PMR, Depression,  O2 2L [employer],  -Rescue inhaler 1-2x/ week, Trelegy 100 and maintenance prednisone 2mg ( 1 mg x 2), Nebulizer Duoneb. Singulair, Mucinex DM,  Covid vax- 4 Phizer Flu vax- declines -----Patient feels she is doing well with her breathing.  Here with  aide who reports she is using inhaled medicines as directed.  No exacerbation.  Rarely uses oxygen and does not routinely sleep with  it-discussed. CXR 11/26/20- IMPRESSION: No active cardiopulmonary disease. ,  ROS-see HPI   + = positive Constitutional:    weight loss, night sweats, fevers, chills, fatigue, lassitude. HEENT:    headaches, +difficulty swallowing, tooth/dental problems, sore throat,  sneezing, itching, ear ache, nasal congestion, post nasal drip, snoring CV:    chest pain, orthopnea, PND, swelling in lower extremities, anasarca,  dizziness, palpitations Resp:   +shortness of breath with exertion or at rest.                +productive cough,  + non-productive cough, coughing up of blood.              change in color of mucus.  +wheezing.   Skin:    rash or lesions. GI:  No-   heartburn, indigestion, abdominal pain, nausea, vomiting, diarrhea,                 change in bowel habits, loss of appetite GU: dysuria, change in color of urine, no urgency or frequency.   flank pain. MS:   +joint pain, stiffness, decreased range of motion, back pain. Neuro-     nothing unusual Psych:  change in mood or affect.  depression or anxiety.   memory loss.  OBJ- Physical Exam General- Alert, Oriented, Affect-appropriate, Distress- none acute, 94% on room air.  +Wheelchair + overweight Skin- rash-none, lesions- none, excoriation- none Lymphadenopathy- none Head- atraumatic            Eyes- Gross vision intact, PERRLA, conjunctivae and secretions clear            Ears- Hearing, canals-normal            Nose- Clear, no-Septal dev, mucus, polyps, erosion, perforation             Throat- Mallampati II , mucosa  clear , drainage- none, tonsils- atrophic Neck- flexible , trachea midline, no stridor , thyroid nl, carotid no bruit Chest - symmetrical excursion , unlabored           Heart/CV- RRR , no murmur , no gallop  , no rub, nl s1 s2                           - JVD- none , edema- none, stasis changes- none, varices- none           Lung- +quiet, clear, wheeze-none, cough- none , dullness-none, rub- none           Chest  wall-  Abd-  Br/ Gen/ Rectal- Not done, not indicated Extrem- cyanosis- none, clubbing, none, atrophy- none, strength- nl Neuro- grossly intact to observation

## 2021-02-26 ENCOUNTER — Other Ambulatory Visit: Payer: Self-pay

## 2021-02-26 ENCOUNTER — Encounter: Payer: Self-pay | Admitting: Internal Medicine

## 2021-02-26 ENCOUNTER — Ambulatory Visit (INDEPENDENT_AMBULATORY_CARE_PROVIDER_SITE_OTHER): Payer: Medicare Other | Admitting: Internal Medicine

## 2021-02-26 DIAGNOSIS — J398 Other specified diseases of upper respiratory tract: Secondary | ICD-10-CM

## 2021-02-26 DIAGNOSIS — J479 Bronchiectasis, uncomplicated: Secondary | ICD-10-CM | POA: Diagnosis not present

## 2021-02-26 NOTE — Patient Instructions (Signed)
Ok to use oxygen if needed to keep O2 saturation above 88%.  We can continue current breathing medicines. Please call for refills or if you need our help.

## 2021-02-26 NOTE — Assessment & Plan Note (Signed)
Uncomplicated without recent exacerbation.  No concerns expressed.  Medications are well-tolerated and seem helpful.  Plan-call for refills as needed.

## 2021-02-26 NOTE — Assessment & Plan Note (Signed)
Some care to maintain pulmonary toilet.  Seems to be doing well with solid and liquid swallowing now.

## 2021-03-02 DIAGNOSIS — Z79899 Other long term (current) drug therapy: Secondary | ICD-10-CM | POA: Diagnosis not present

## 2021-03-02 DIAGNOSIS — R0602 Shortness of breath: Secondary | ICD-10-CM | POA: Diagnosis not present

## 2021-03-02 DIAGNOSIS — M129 Arthropathy, unspecified: Secondary | ICD-10-CM | POA: Diagnosis not present

## 2021-03-02 DIAGNOSIS — R300 Dysuria: Secondary | ICD-10-CM | POA: Diagnosis not present

## 2021-03-02 DIAGNOSIS — R5383 Other fatigue: Secondary | ICD-10-CM | POA: Diagnosis not present

## 2021-03-02 DIAGNOSIS — E1165 Type 2 diabetes mellitus with hyperglycemia: Secondary | ICD-10-CM | POA: Diagnosis not present

## 2021-03-02 DIAGNOSIS — M545 Low back pain, unspecified: Secondary | ICD-10-CM | POA: Diagnosis not present

## 2021-03-03 NOTE — Telephone Encounter (Signed)
Called Almyra Free. LDVM at [phone] requesting to have the forms sent over again to our office since we have not received them. Office and fax number were provided.

## 2021-03-04 ENCOUNTER — Telehealth: Payer: Self-pay | Admitting: Internal Medicine

## 2021-03-04 DIAGNOSIS — N3020 Other chronic cystitis without hematuria: Secondary | ICD-10-CM | POA: Diagnosis not present

## 2021-03-04 NOTE — Telephone Encounter (Signed)
LVM to schedule appt for Prolia injection.

## 2021-03-04 NOTE — Telephone Encounter (Signed)
Patient's daughter Kayla Thompson requesting a call back to discuss getting patient a sooner appt w/ provider regarding patient's weight loss   Caller also checking status of faxed form to [facility]  Patient next ov 03-11-2021 w/ Dr. Jenny Reichmann

## 2021-03-10 NOTE — Telephone Encounter (Signed)
[name] is not listed on dpr.

## 2021-03-11 ENCOUNTER — Other Ambulatory Visit: Payer: Self-pay

## 2021-03-11 ENCOUNTER — Encounter: Payer: Self-pay | Admitting: Internal Medicine

## 2021-03-11 ENCOUNTER — Ambulatory Visit (INDEPENDENT_AMBULATORY_CARE_PROVIDER_SITE_OTHER): Payer: Medicare Other | Admitting: Internal Medicine

## 2021-03-11 DIAGNOSIS — R634 Abnormal weight loss: Secondary | ICD-10-CM

## 2021-03-11 DIAGNOSIS — J4530 Mild persistent asthma, uncomplicated: Secondary | ICD-10-CM | POA: Diagnosis not present

## 2021-03-11 DIAGNOSIS — K5900 Constipation, unspecified: Secondary | ICD-10-CM | POA: Diagnosis not present

## 2021-03-11 MED ORDER — MEGESTROL ACETATE 40 MG PO TABS: 40.0000 mg | ORAL_TABLET | Freq: Every day | ORAL | 3 refills | Status: DC

## 2021-03-11 MED ORDER — LINACLOTIDE 72 MCG PO CAPS: 72.0000 ug | ORAL_CAPSULE | Freq: Every day | ORAL | 3 refills | Status: DC

## 2021-03-11 NOTE — Assessment & Plan Note (Signed)
I enocuraged start linzess daily use, with holding senakot and miralax when using this, hopefully to reduce her stool burden and improve nausea /GI discomfort and allow increased calorie intake

## 2021-03-11 NOTE — Assessment & Plan Note (Signed)
Also for megace asd, cont all other meds including remeron for now

## 2021-03-11 NOTE — Progress Notes (Signed)
Patient ID: Kayla Thompson, female   DOB: [birth year], 85 y.o.   MRN: [id number]        Chief Complaint: follow up wt loss, constipation, dyspnea       HPI:  Kayla Thompson is a 85 y.o. female here with daytime home assist person/sitter and daughter on speakerphone; Pt denies chest pain, increased sob or doe, wheezing, orthopnea, PND, increased LE swelling, palpitations, dizziness or syncope.  But though Denies worsening reflux, abd pain, dysphagia, n/v,or blood, she does have persistent constipation, intermittent nausea and further wt loss despite prn miralax (doesn't like to take it), colace, and remeron since last visit Has linzes per GI at home but has not taken even the first dose as daughter was fearful it would be too strong.  Pt daughter also asking if pt should increase her albuterol neg use to twice per day, though pt has more recently seen Dr Annamaria Boots pulmonary with current rx for q 4 hrs prn albuterol nebs, and appears fearful of mother using this med as well.    Pt already taking ensure 2 cans per day most days  BP Readings from Last 3 Encounters:  03/11/21 102/80  02/26/21 110/64  01/12/21 136/65         Past Medical History:  Diagnosis Date   Anxiety    Arthritis    Asthma    Cataracts, bilateral    removed by surgery   COPD (chronic obstructive pulmonary disease) ([place])    Depression    Diabetes mellitus without complication (HCC)    type 2   Dyspnea    with exertion   Fibromyalgia    GERD (gastroesophageal reflux disease)    History of chemotherapy    History of fractured pelvis    Hypertension    Hypothyroidism    Osteoporosis 12/2017   T score -2.8 stable from prior DEXA   Osteoporosis    Ovarian cancer ([place])    Pneumonia    several times   Past Surgical History:  Procedure Laterality Date   ABDOMINAL HYSTERECTOMY  1996   ANKLE FRACTURE SURGERY     plate and screws   BIOPSY  02/05/2019   Procedure: BIOPSY;  Surgeon: Irving Copas., MD;  Location:  [facility] ENDOSCOPY;  Service: Gastroenterology;;   BLADDER SUSPENSION     CATARACT EXTRACTION     COLONOSCOPY     ESOPHAGEAL BRUSHING  02/05/2019   Procedure: ESOPHAGEAL BRUSHING;  Surgeon: Irving Copas., MD;  Location: [facility] ENDOSCOPY;  Service: Gastroenterology;;   ESOPHAGOGASTRODUODENOSCOPY (EGD) WITH PROPOFOL N/A 02/05/2019   Procedure: ESOPHAGOGASTRODUODENOSCOPY (EGD) WITH PROPOFOL with dialtion;  Surgeon: Irving Copas., MD;  Location: [place];  Service: Gastroenterology;  Laterality: N/A;   EYE SURGERY     cataracts removed-bilateral   hip surgey     knee surgey     LUNG SURGERY     OOPHORECTOMY     BSO   SAVORY DILATION N/A 02/05/2019   Procedure: SAVORY DILATION;  Surgeon: Irving Copas., MD;  Location: [place];  Service: Gastroenterology;  Laterality: N/A;   TONSILLECTOMY     UPPER GI ENDOSCOPY      reports that she has never smoked. She has never used smokeless tobacco. She reports that she does not drink alcohol and does not use drugs. family history includes CAD in her father; Diabetes in her father, paternal aunt, paternal grandfather, paternal grandmother, and paternal uncle; Lung cancer in her maternal aunt and mother. Allergies  Allergen Reactions  Tape Other (See Comments)    SKIN IS VERY THIN AND DELICATE- will tear like paper!!   Lasix [Furosemide] Other (See Comments)    Confusion- MD stopped this med   Latex Rash   Penicillins Rash    Has patient had a PCN reaction causing immediate rash, facial/tongue/throat swelling, SOB or lightheadedness with hypotension: No Has patient had a PCN reaction causing severe rash involving mucus membranes or skin necrosis: No Has patient had a PCN reaction that required hospitalization: No Has patient had a PCN reaction occurring within the last 10 years: No If all of the above answers are "NO", then may proceed with Cephalosporin use.    Current Outpatient Medications on File Prior to Visit   Medication Sig Dispense Refill   acetaminophen (TYLENOL) 650 MG CR tablet Take 1,300 mg by mouth See admin instructions. Take 1,300 mg by mouth every 4-5 hours IN CONJUNCTION WITH one Percocet 7.5/325 tablet- for pain     albuterol (PROAIR HFA) 108 (90 Base) MCG/ACT inhaler Inhale 2 puffs into the lungs every 6 (six) hours as needed for wheezing or shortness of breath. 18 g 12   Apoaequorin (PREVAGEN PO) Take 1 tablet by mouth every evening.     Ascorbic Acid (VITAMIN C) 1000 MG tablet Take 1,000 mg by mouth in the morning and at bedtime.     baclofen (LIORESAL) 10 MG tablet TAKE 0.5-1 TABLET BY MOUTH TWICE DAILY AS NEEDED FOR MUSCLE SPASMS OR PAIN 180 tablet 1   BD PEN NEEDLE NANO 2ND GEN 32G X 4 MM MISC USE AS DIRECTED 4 TIMES A DAY 100 each 5   busPIRone (BUSPAR) 5 MG tablet Take 5-10 mg by mouth daily as needed (for anxiety).     Calcium Carbonate (CALTRATE 600 PO) Take 600 mg by mouth in the morning and at bedtime.     cholecalciferol (VITAMIN D3) 25 MCG (1000 UNIT) tablet Take 2,000 Units by mouth in the morning.     clotrimazole-betamethasone (LOTRISONE) cream APPLY TO AFFECTED AREA TWICE A DAY (Patient taking differently: Apply 1 application topically 2 (two) times daily as needed (to affected sites).) 45 g 6   Cyanocobalamin (VITAMIN B 12 PO) Take 2,500 mcg by mouth every Thursday.     denosumab (PROLIA) 60 MG/ML SOSY injection Inject 60 mg into the skin every 6 (six) months.     Dextromethorphan-guaiFENesin (MUCINEX DM MAXIMUM STRENGTH) 60-1200 MG TB12 Take 1 tablet by mouth in the morning and at bedtime.     docusate sodium (COLACE) 100 MG capsule Take 1 capsule (100 mg total) by mouth daily. 90 capsule 3   DULoxetine (CYMBALTA) 30 MG capsule Take 1 capsule (30 mg total) by mouth daily. (Patient taking differently: Take 30 mg by mouth in the morning.) 90 capsule 3   DULoxetine (CYMBALTA) 60 MG capsule Take 1 capsule (60 mg total) by mouth daily. (Patient taking differently: Take 60 mg by  mouth in the morning.) 90 capsule 3   estradiol (ESTRACE) 0.1 MG/GM vaginal cream Place 1 Applicatorful vaginally See admin instructions. Takes 2 times a week     feeding supplement (ENSURE ENLIVE / ENSURE PLUS) LIQD Take 237 mLs by mouth 2 (two) times daily between meals. (Patient taking differently: Take 237 mLs by mouth 3 (three) times daily with meals.) 237 mL 12   gabapentin (NEURONTIN) 100 MG capsule Take 100 mg by mouth at bedtime.     glucose blood (FREESTYLE LITE) test strip USE TO TEST BLOOD SUGAR 3 TIMES  DAILY. 100 strip 5   hydrocortisone (ANUSOL-HC) 25 MG suppository Place 1 suppository (25 mg total) rectally 2 (two) times daily. 12 suppository 0   hydrocortisone 2.5 % ointment Apply 1 application topically 2 (two) times daily as needed (to affected sites).     insulin glargine (LANTUS) 100 UNIT/ML Solostar Pen Inject 5 Units into the skin in the morning.     insulin lispro (HUMALOG KWIKPEN) 100 UNIT/ML KwikPen Inject 2-4 Units into the skin daily before supper. (Patient taking differently: Inject 2 Units into the skin See admin instructions. Inject 2 units into the skin before meals as needed for a low BGL) 15 mL 11   ipratropium-albuterol (DUONEB) 0.5-2.5 (3) MG/3ML SOLN Inhale 3 mLs into the lungs every 4 (four) hours as needed. 90 mL 11   Lancets (FREESTYLE) lancets Use to test blood sugar 3 times daily. Dx: E11.65 100 each 3   levothyroxine (SYNTHROID) 50 MCG tablet TAKE 1 TABLET BY MOUTH EVERY DAY BEFORE BREAKFAST (Patient taking differently: Take 50 mcg by mouth daily before breakfast.) 90 tablet 1   Lidocaine 4 % PTCH Apply 1 patch topically 2 (two) times daily as needed (pain).     melatonin 1 MG TABS tablet Take 1 mg by mouth at bedtime as needed (for sleep).     methenamine (MANDELAMINE) 1 g tablet Take 1,000 mg by mouth in the morning and at bedtime.     mirtazapine (REMERON) 15 MG tablet Take 1 tablet (15 mg total) by mouth at bedtime. 30 tablet 3   Misc Natural Products  (TART CHERRY ADVANCED PO) Take 1,000 mg by mouth every evening.     montelukast (SINGULAIR) 10 MG tablet TAKE 1 TABLET BY MOUTH EVERY DAY 90 tablet 2   MUCINEX FAST-MAX CONGEST COUGH 2.5-5-100 MG/5ML LIQD Take 2.5-5 mLs by mouth at bedtime as needed (for coughing).     Multiple Vitamins-Minerals (CENTRUM SILVER 50+WOMEN) TABS Take 1 tablet by mouth daily with breakfast.     naloxone (NARCAN) nasal spray 4 mg/0.1 mL Place 1 spray into the nose once as needed (overdose).     nitrofurantoin, macrocrystal-monohydrate, (MACROBID) 100 MG capsule Take 100 mg by mouth daily.     omeprazole (PRILOSEC) 20 MG capsule TAKE 1 CAPSULE TWICE A DAY BEFORE A MEAL (Patient taking differently: Take 20 mg by mouth 2 (two) times daily before a meal.) 180 capsule 1   oxyCODONE-acetaminophen (PERCOCET) 7.5-325 MG tablet Take 1 tablet by mouth See admin instructions. Take 1 tablet by mouth every 4-5 hours (IN CONJUNCTION WITH 1,300 mg of Tylenol)     polyethylene glycol (MIRALAX / GLYCOLAX) 17 g packet Take 17 g by mouth daily.     predniSONE (DELTASONE) 1 MG tablet Take 2 tablets (2 mg total) by mouth daily. (Patient taking differently: Take 1 mg by mouth in the morning and at bedtime.) 60 tablet 1   Propylene Glycol (SYSTANE COMPLETE OP) Place 1-2 drops into both eyes 2 (two) times daily as needed (for dryness).     Pseudoephedrine-guaiFENesin (MUCINEX D MAX STRENGTH PO) Take 1,200 mg by mouth in the morning and at bedtime.     rosuvastatin (CRESTOR) 10 MG tablet Take 1 tablet (10 mg total) by mouth every evening. 90 tablet 3   senna (SENOKOT) 8.6 MG TABS tablet Take 1 tablet (8.6 mg total) by mouth daily as needed for moderate constipation. 90 tablet 3   TRELEGY ELLIPTA 100-62.5-25 MCG/INH AEPB INHALE 1 PUFF INTO THE LUNGS DAILY. RINSE MOUTH (Patient taking  differently: Inhale 1 puff into the lungs in the morning. Rinse mouth) 60 each 11   TURMERIC PO Take 1,000 mg by mouth in the morning.     No current  facility-administered medications on file prior to visit.        ROS:  All others reviewed and negative.  Objective        PE:  BP 102/80    Pulse (!) 58    Resp 18    Ht 4\' 11"  (1.499 m)    Wt 96 lb 3.2 oz (43.6 kg)    LMP  (LMP Unknown)    BMI 19.43 kg/m                 Constitutional: Pt appears in NAD               HENT: Head: NCAT.                Right Ear: External ear normal.                 Left Ear: External ear normal.                Eyes: . Pupils are equal, round, and reactive to light. Conjunctivae and EOM are normal               Nose: without d/c or deformity               Neck: Neck supple. Gross normal ROM               Cardiovascular: Normal rate and regular rhythm.                 Pulmonary/Chest: Effort normal and breath sounds without rales or wheezing.                Abd:  Soft, NT, ND, + BS, no organomegaly               Neurological: Pt is alert. At baseline orientation, motor grossly intact               Skin: Skin is warm. No rashes, no other new lesions, LE edema - none               Psychiatric: Pt behavior is normal without agitation   Micro: none  Cardiac tracings I have personally interpreted today:  none  Pertinent Radiological findings (summarize): none   Lab Results  Component Value Date   WBC 12.8 (H) 01/11/2021   HGB 8.9 (L) 01/11/2021   HCT 27.8 (L) 01/11/2021   PLT 273 01/11/2021   GLUCOSE 219 (H) 01/11/2021   CHOL 132 10/16/2019   TRIG 114 10/16/2019   HDL 55 10/16/2019   LDLCALC 57 10/16/2019   ALT 20 01/07/2021   AST 20 01/07/2021   NA 132 (L) 01/11/2021   K 3.5 01/11/2021   CL 98 01/11/2021   CREATININE 0.90 01/11/2021   BUN 13 01/11/2021   CO2 26 01/11/2021   TSH 1.342 01/08/2021   INR 1.0 07/07/2020   HGBA1C 6.4 (A) 12/18/2020   MICROALBUR 4.6 (H) 01/08/2019   Assessment/Plan:  Kayla Thompson is a 85 y.o. White or Caucasian [1] female with  has a past medical history of Anxiety, Arthritis, Asthma, Cataracts, bilateral,  COPD (chronic obstructive pulmonary disease) ([place]), Depression, Diabetes mellitus without complication ([place]), Dyspnea, Fibromyalgia, GERD (gastroesophageal reflux disease), History of chemotherapy, History of fractured pelvis, Hypertension, Hypothyroidism, Osteoporosis (12/2017),  Osteoporosis, Ovarian cancer ([place]), and Pneumonia.  Constipation I enocuraged start linzess daily use, with holding senakot and miralax when using this, hopefully to reduce her stool burden and improve nausea /GI discomfort and allow increased calorie intake  Weight loss Also for megace asd, cont all other meds including remeron for now  Asthma, chronic Pt and daughter encouraged to take albuterol neb prn as rx  Followup: Return in about 4 weeks (around 04/08/2021).  Cathlean Cower, MD 03/11/2021 9:29 PM [facility]

## 2021-03-11 NOTE — Assessment & Plan Note (Signed)
Pt and daughter encouraged to take albuterol neb prn as rx

## 2021-03-11 NOTE — Patient Instructions (Signed)
Please take all new medication as prescribed - the linzess 72 mg per day, and the megace 40 mg for appetite  Ok to increase the nebulizer to at least twice per day  Ok to [facility] on taking the miralax and senakot when taking the linzess  Please continue all other medications as before, and refills have been done if requested.  Please have the pharmacy call with any other refills you may need.  Please keep your appointments with your specialists as you may have planned  Please see Dr Sharlet Salina in 1 month

## 2021-03-20 ENCOUNTER — Telehealth: Payer: Self-pay | Admitting: Internal Medicine

## 2021-03-20 ENCOUNTER — Other Ambulatory Visit: Payer: Self-pay | Admitting: Internal Medicine

## 2021-03-20 NOTE — Telephone Encounter (Signed)
Connected to [facility] 12.29.2022.   Caller states she is calling for her mother, who was recently prescribed linzess for bowel issues and started it on Saturday. She is having some stomach cramps, which is a side effect of the linzess. She is on oxycodone 5 times a day so she has been having some constipation from that for a while. Daughter reports more bowel movements (3 large bms today) but her mother has not eaten much today. Daughter is more concerned about her abdominal pain and wonders if she could hold the linzess tomorrow and make an appt with her PCP. She reports the pain has been going on all day but not 24 hours. Daughter states her mother will always rate her pain as high, 8,9, or 10, but will refuse to be seen in ED. Daughter denies any fever or other symptoms at this time. Daughter has tried to reach her mother and was unable to reach her. Mother did call back and advise pain is better now. Caller denies any new or worsening symptoms at this time and declined to be triaged at this time.

## 2021-03-20 NOTE — Telephone Encounter (Signed)
Okay to use miralax instead of linzess goal for regular BM every 1-2 days.

## 2021-03-20 NOTE — Telephone Encounter (Signed)
See below

## 2021-03-20 NOTE — Telephone Encounter (Signed)
Spoke with the patient's daughter [name] and she has been made aware of Dr. Nathanial Millman recommendations.

## 2021-03-20 NOTE — Telephone Encounter (Signed)
[facility]onda patient daughter calling in (listed on [facility] 01/2019)  Says she has some questions & concerns regarding 2 new meds patient has started taking linaclotide (LINZESS) 72 MCG capsule & megestrol (MEGACE) 40 MG tablet  Symptoms: severe cramping..frequent bathroom visits..increase in BS levels  Please call [phone]

## 2021-03-24 ENCOUNTER — Other Ambulatory Visit: Payer: Self-pay | Admitting: Internal Medicine

## 2021-03-24 DIAGNOSIS — E039 Hypothyroidism, unspecified: Secondary | ICD-10-CM

## 2021-03-26 ENCOUNTER — Encounter (HOSPITAL_BASED_OUTPATIENT_CLINIC_OR_DEPARTMENT_OTHER): Payer: Self-pay | Admitting: Obstetrics and Gynecology

## 2021-03-26 ENCOUNTER — Emergency Department (HOSPITAL_BASED_OUTPATIENT_CLINIC_OR_DEPARTMENT_OTHER): Payer: Medicare Other

## 2021-03-26 ENCOUNTER — Other Ambulatory Visit: Payer: Self-pay

## 2021-03-26 ENCOUNTER — Emergency Department (HOSPITAL_BASED_OUTPATIENT_CLINIC_OR_DEPARTMENT_OTHER): Payer: Medicare Other | Admitting: Radiology

## 2021-03-26 ENCOUNTER — Inpatient Hospital Stay (HOSPITAL_BASED_OUTPATIENT_CLINIC_OR_DEPARTMENT_OTHER)
Admission: EM | Admit: 2021-03-26 | Discharge: 2021-03-31 | DRG: 871 | Disposition: A | Discharge status: Home Health | Payer: Medicare Other | Attending: Internal Medicine | Admitting: Internal Medicine

## 2021-03-26 DIAGNOSIS — L89611 Pressure ulcer of right heel, stage 1: Secondary | ICD-10-CM | POA: Diagnosis present

## 2021-03-26 DIAGNOSIS — R627 Adult failure to thrive: Secondary | ICD-10-CM | POA: Diagnosis present

## 2021-03-26 DIAGNOSIS — Z794 Long term (current) use of insulin: Secondary | ICD-10-CM

## 2021-03-26 DIAGNOSIS — U071 COVID-19: Secondary | ICD-10-CM | POA: Diagnosis not present

## 2021-03-26 DIAGNOSIS — K5909 Other constipation: Secondary | ICD-10-CM | POA: Diagnosis present

## 2021-03-26 DIAGNOSIS — A419 Sepsis, unspecified organism: Principal | ICD-10-CM | POA: Diagnosis present

## 2021-03-26 DIAGNOSIS — M797 Fibromyalgia: Secondary | ICD-10-CM | POA: Diagnosis present

## 2021-03-26 DIAGNOSIS — Z66 Do not resuscitate: Secondary | ICD-10-CM | POA: Diagnosis present

## 2021-03-26 DIAGNOSIS — M353 Polymyalgia rheumatica: Secondary | ICD-10-CM | POA: Diagnosis present

## 2021-03-26 DIAGNOSIS — I110 Hypertensive heart disease with heart failure: Secondary | ICD-10-CM | POA: Diagnosis present

## 2021-03-26 DIAGNOSIS — Z8543 Personal history of malignant neoplasm of ovary: Secondary | ICD-10-CM | POA: Diagnosis not present

## 2021-03-26 DIAGNOSIS — E785 Hyperlipidemia, unspecified: Secondary | ICD-10-CM | POA: Diagnosis present

## 2021-03-26 DIAGNOSIS — Z7952 Long term (current) use of systemic steroids: Secondary | ICD-10-CM | POA: Diagnosis not present

## 2021-03-26 DIAGNOSIS — Z888 Allergy status to other drugs, medicaments and biological substances status: Secondary | ICD-10-CM

## 2021-03-26 DIAGNOSIS — K219 Gastro-esophageal reflux disease without esophagitis: Secondary | ICD-10-CM | POA: Diagnosis present

## 2021-03-26 DIAGNOSIS — Z743 Need for continuous supervision: Secondary | ICD-10-CM | POA: Diagnosis not present

## 2021-03-26 DIAGNOSIS — M810 Age-related osteoporosis without current pathological fracture: Secondary | ICD-10-CM | POA: Diagnosis present

## 2021-03-26 DIAGNOSIS — R7401 Elevation of levels of liver transaminase levels: Secondary | ICD-10-CM | POA: Diagnosis present

## 2021-03-26 DIAGNOSIS — Z91040 Latex allergy status: Secondary | ICD-10-CM

## 2021-03-26 DIAGNOSIS — Z79818 Long term (current) use of other agents affecting estrogen receptors and estrogen levels: Secondary | ICD-10-CM

## 2021-03-26 DIAGNOSIS — R6520 Severe sepsis without septic shock: Secondary | ICD-10-CM

## 2021-03-26 DIAGNOSIS — Z91048 Other nonmedicinal substance allergy status: Secondary | ICD-10-CM | POA: Diagnosis not present

## 2021-03-26 DIAGNOSIS — Z90722 Acquired absence of ovaries, bilateral: Secondary | ICD-10-CM

## 2021-03-26 DIAGNOSIS — Z8249 Family history of ischemic heart disease and other diseases of the circulatory system: Secondary | ICD-10-CM

## 2021-03-26 DIAGNOSIS — E039 Hypothyroidism, unspecified: Secondary | ICD-10-CM | POA: Diagnosis present

## 2021-03-26 DIAGNOSIS — Z90710 Acquired absence of both cervix and uterus: Secondary | ICD-10-CM

## 2021-03-26 DIAGNOSIS — N3000 Acute cystitis without hematuria: Secondary | ICD-10-CM | POA: Diagnosis not present

## 2021-03-26 DIAGNOSIS — G8929 Other chronic pain: Secondary | ICD-10-CM | POA: Diagnosis present

## 2021-03-26 DIAGNOSIS — F32A Depression, unspecified: Secondary | ICD-10-CM | POA: Diagnosis present

## 2021-03-26 DIAGNOSIS — E860 Dehydration: Secondary | ICD-10-CM | POA: Diagnosis present

## 2021-03-26 DIAGNOSIS — E1151 Type 2 diabetes mellitus with diabetic peripheral angiopathy without gangrene: Secondary | ICD-10-CM | POA: Diagnosis present

## 2021-03-26 DIAGNOSIS — Z833 Family history of diabetes mellitus: Secondary | ICD-10-CM

## 2021-03-26 DIAGNOSIS — J449 Chronic obstructive pulmonary disease, unspecified: Secondary | ICD-10-CM | POA: Diagnosis present

## 2021-03-26 DIAGNOSIS — I5032 Chronic diastolic (congestive) heart failure: Secondary | ICD-10-CM | POA: Diagnosis present

## 2021-03-26 DIAGNOSIS — J45909 Unspecified asthma, uncomplicated: Secondary | ICD-10-CM | POA: Diagnosis present

## 2021-03-26 DIAGNOSIS — G9341 Metabolic encephalopathy: Secondary | ICD-10-CM | POA: Diagnosis not present

## 2021-03-26 DIAGNOSIS — N390 Urinary tract infection, site not specified: Secondary | ICD-10-CM

## 2021-03-26 DIAGNOSIS — R410 Disorientation, unspecified: Secondary | ICD-10-CM | POA: Diagnosis not present

## 2021-03-26 DIAGNOSIS — Z79899 Other long term (current) drug therapy: Secondary | ICD-10-CM

## 2021-03-26 DIAGNOSIS — R509 Fever, unspecified: Secondary | ICD-10-CM | POA: Diagnosis not present

## 2021-03-26 DIAGNOSIS — Z880 Allergy status to penicillin: Secondary | ICD-10-CM

## 2021-03-26 DIAGNOSIS — Z7951 Long term (current) use of inhaled steroids: Secondary | ICD-10-CM

## 2021-03-26 DIAGNOSIS — Z87440 Personal history of urinary (tract) infections: Secondary | ICD-10-CM

## 2021-03-26 DIAGNOSIS — J4530 Mild persistent asthma, uncomplicated: Secondary | ICD-10-CM | POA: Diagnosis not present

## 2021-03-26 DIAGNOSIS — Z79890 Hormone replacement therapy (postmenopausal): Secondary | ICD-10-CM

## 2021-03-26 DIAGNOSIS — L8990 Pressure ulcer of unspecified site, unspecified stage: Secondary | ICD-10-CM | POA: Insufficient documentation

## 2021-03-26 DIAGNOSIS — R531 Weakness: Secondary | ICD-10-CM | POA: Diagnosis not present

## 2021-03-26 DIAGNOSIS — Z9221 Personal history of antineoplastic chemotherapy: Secondary | ICD-10-CM

## 2021-03-26 DIAGNOSIS — R2689 Other abnormalities of gait and mobility: Secondary | ICD-10-CM | POA: Diagnosis not present

## 2021-03-26 LAB — CBC WITH DIFFERENTIAL/PLATELET
Abs Immature Granulocytes: 0.13 10*3/uL — ABNORMAL HIGH (ref 0.00–0.07)
Basophils Absolute: 0.1 10*3/uL (ref 0.0–0.1)
Basophils Relative: 0 %
Eosinophils Absolute: 0.2 10*3/uL (ref 0.0–0.5)
Eosinophils Relative: 1 %
HCT: 30.5 % — ABNORMAL LOW (ref 36.0–46.0)
Hemoglobin: 10.2 g/dL — ABNORMAL LOW (ref 12.0–15.0)
Immature Granulocytes: 1 %
Lymphocytes Relative: 20 %
Lymphs Abs: 3.2 10*3/uL (ref 0.7–4.0)
MCH: 35.9 pg — ABNORMAL HIGH (ref 26.0–34.0)
MCHC: 33.4 g/dL (ref 30.0–36.0)
MCV: 107.4 fL — ABNORMAL HIGH (ref 80.0–100.0)
Monocytes Absolute: 0.9 10*3/uL (ref 0.1–1.0)
Monocytes Relative: 6 %
Neutro Abs: 11.1 10*3/uL — ABNORMAL HIGH (ref 1.7–7.7)
Neutrophils Relative %: 72 %
Platelets: 405 10*3/uL — ABNORMAL HIGH (ref 150–400)
RBC: 2.84 MIL/uL — ABNORMAL LOW (ref 3.87–5.11)
RDW: 14.1 % (ref 11.5–15.5)
WBC: 15.6 10*3/uL — ABNORMAL HIGH (ref 4.0–10.5)
nRBC: 0 % (ref 0.0–0.2)

## 2021-03-26 LAB — URINALYSIS, ROUTINE W REFLEX MICROSCOPIC
Bilirubin Urine: NEGATIVE
Glucose, UA: NEGATIVE mg/dL
Hgb urine dipstick: NEGATIVE
Ketones, ur: NEGATIVE mg/dL
Nitrite: NEGATIVE
Protein, ur: 30 mg/dL — AB
Specific Gravity, Urine: 1.017 (ref 1.005–1.030)
pH: 6 (ref 5.0–8.0)

## 2021-03-26 LAB — COMPREHENSIVE METABOLIC PANEL
ALT: 92 U/L — ABNORMAL HIGH (ref 0–44)
AST: 29 U/L (ref 15–41)
Albumin: 3.8 g/dL (ref 3.5–5.0)
Alkaline Phosphatase: 66 U/L (ref 38–126)
Anion gap: 11 (ref 5–15)
BUN: 19 mg/dL (ref 8–23)
CO2: 24 mmol/L (ref 22–32)
Calcium: 10 mg/dL (ref 8.9–10.3)
Chloride: 98 mmol/L (ref 98–111)
Creatinine, Ser: 0.8 mg/dL (ref 0.44–1.00)
GFR, Estimated: >60 mL/min (ref >60)
Glucose, Bld: 164 mg/dL — ABNORMAL HIGH (ref 70–99)
Potassium: 3.7 mmol/L (ref 3.5–5.1)
Sodium: 133 mmol/L — ABNORMAL LOW (ref 135–145)
Total Bilirubin: 0.5 mg/dL (ref 0.3–1.2)
Total Protein: 7.1 g/dL (ref 6.5–8.1)

## 2021-03-26 LAB — RESP PANEL BY RT-PCR (FLU A&B, COVID) ARPGX2
Influenza A by PCR: NEGATIVE
Influenza B by PCR: NEGATIVE
SARS Coronavirus 2 by RT PCR: POSITIVE — AB

## 2021-03-26 LAB — LACTIC ACID, PLASMA
Lactic Acid, Venous: 1.1 mmol/L (ref 0.5–1.9)
Lactic Acid, Venous: 1 mmol/L (ref 0.5–1.9)

## 2021-03-26 LAB — CBG MONITORING, ED: Glucose-Capillary: 131 mg/dL — ABNORMAL HIGH (ref 70–99)

## 2021-03-26 MED ORDER — LACTATED RINGERS IV SOLN: INTRAVENOUS | Status: DC

## 2021-03-26 MED ORDER — SODIUM CHLORIDE 0.9 % IV SOLN: INTRAVENOUS | Status: AC

## 2021-03-26 MED ORDER — SODIUM CHLORIDE 0.9 % IV SOLN
1.0000 g | Freq: Once | INTRAVENOUS | Status: AC
  Administered 2021-03-26: 1 g via INTRAVENOUS
  Filled 2021-03-26: qty 10

## 2021-03-26 MED ORDER — FLUTICASONE-UMECLIDIN-VILANT 100-62.5-25 MCG/ACT IN AEPB: 1.0000 | INHALATION_SPRAY | Freq: Every day | RESPIRATORY_TRACT | Status: DC

## 2021-03-26 MED ORDER — SODIUM CHLORIDE 0.9 % IV SOLN
100.0000 mg | Freq: Every day | INTRAVENOUS | Status: AC
  Administered 2021-03-26 – 2021-03-28 (×4): 100 mg via INTRAVENOUS
  Filled 2021-03-26 (×2): qty 20

## 2021-03-26 MED ORDER — DULOXETINE HCL 60 MG PO CPEP
60.0000 mg | ORAL_CAPSULE | Freq: Every morning | ORAL | Status: DC
  Administered 2021-03-27 – 2021-03-31 (×5): 60 mg via ORAL
  Filled 2021-03-26 (×5): qty 1

## 2021-03-26 MED ORDER — ENSURE ENLIVE PO LIQD
237.0000 mL | Freq: Three times a day (TID) | ORAL | Status: DC
  Administered 2021-03-27 – 2021-03-31 (×14): 237 mL via ORAL

## 2021-03-26 MED ORDER — LACTATED RINGERS IV BOLUS
1000.0000 mL | Freq: Once | INTRAVENOUS | Status: AC
  Administered 2021-03-26: 1000 mL via INTRAVENOUS

## 2021-03-26 MED ORDER — ACETAMINOPHEN 325 MG PO TABS
650.0000 mg | ORAL_TABLET | Freq: Four times a day (QID) | ORAL | Status: DC | PRN
  Administered 2021-03-27 – 2021-03-31 (×5): 650 mg via ORAL
  Filled 2021-03-26 (×5): qty 2

## 2021-03-26 MED ORDER — ACETAMINOPHEN 325 MG PO TABS
650.0000 mg | ORAL_TABLET | Freq: Once | ORAL | Status: AC | PRN
  Administered 2021-03-26: 650 mg via ORAL
  Filled 2021-03-26: qty 2

## 2021-03-26 MED ORDER — MIRTAZAPINE 15 MG PO TABS
15.0000 mg | ORAL_TABLET | Freq: Every day | ORAL | Status: DC
  Administered 2021-03-27 – 2021-03-30 (×5): 15 mg via ORAL
  Filled 2021-03-26 (×5): qty 1

## 2021-03-26 MED ORDER — SODIUM CHLORIDE 0.9 % IV SOLN: 100.0000 mg | INTRAVENOUS | Status: AC

## 2021-03-26 MED ORDER — FLUTICASONE FUROATE-VILANTEROL 100-25 MCG/ACT IN AEPB
1.0000 | INHALATION_SPRAY | Freq: Every day | RESPIRATORY_TRACT | Status: DC
  Administered 2021-03-27 – 2021-03-31 (×5): 1 via RESPIRATORY_TRACT
  Filled 2021-03-26: qty 28

## 2021-03-26 MED ORDER — ALBUTEROL SULFATE HFA 108 (90 BASE) MCG/ACT IN AERS: 2.0000 | INHALATION_SPRAY | RESPIRATORY_TRACT | Status: DC | PRN

## 2021-03-26 MED ORDER — GABAPENTIN 100 MG PO CAPS
100.0000 mg | ORAL_CAPSULE | Freq: Every day | ORAL | Status: DC
  Administered 2021-03-27 – 2021-03-30 (×5): 100 mg via ORAL
  Filled 2021-03-26 (×5): qty 1

## 2021-03-26 MED ORDER — ROSUVASTATIN CALCIUM 10 MG PO TABS
10.0000 mg | ORAL_TABLET | Freq: Every evening | ORAL | Status: DC
  Administered 2021-03-27 – 2021-03-30 (×4): 10 mg via ORAL
  Filled 2021-03-26 (×4): qty 1

## 2021-03-26 MED ORDER — POLYETHYLENE GLYCOL 3350 17 G PO PACK
17.0000 g | PACK | Freq: Every day | ORAL | Status: DC
  Administered 2021-03-27 – 2021-03-31 (×4): 17 g via ORAL
  Filled 2021-03-26 (×5): qty 1

## 2021-03-26 MED ORDER — ENOXAPARIN SODIUM 30 MG/0.3ML IJ SOSY
30.0000 mg | PREFILLED_SYRINGE | INTRAMUSCULAR | Status: DC
  Administered 2021-03-27 – 2021-03-31 (×5): 30 mg via SUBCUTANEOUS
  Filled 2021-03-26 (×5): qty 0.3

## 2021-03-26 MED ORDER — MONTELUKAST SODIUM 10 MG PO TABS
10.0000 mg | ORAL_TABLET | Freq: Every day | ORAL | Status: DC
  Administered 2021-03-27 – 2021-03-31 (×5): 10 mg via ORAL
  Filled 2021-03-26 (×5): qty 1

## 2021-03-26 MED ORDER — DOCUSATE SODIUM 100 MG PO CAPS
100.0000 mg | ORAL_CAPSULE | Freq: Every day | ORAL | Status: DC
  Administered 2021-03-27 – 2021-03-31 (×5): 100 mg via ORAL
  Filled 2021-03-26 (×5): qty 1

## 2021-03-26 MED ORDER — LEVOTHYROXINE SODIUM 50 MCG PO TABS
50.0000 ug | ORAL_TABLET | Freq: Every day | ORAL | Status: DC
  Administered 2021-03-27 – 2021-03-31 (×5): 50 ug via ORAL
  Filled 2021-03-26 (×5): qty 1

## 2021-03-26 MED ORDER — INSULIN GLARGINE 100 UNIT/ML SOLOSTAR PEN: 5.0000 [IU] | PEN_INJECTOR | Freq: Every morning | SUBCUTANEOUS | Status: DC

## 2021-03-26 MED ORDER — INSULIN ASPART 100 UNIT/ML IJ SOLN
0.0000 [IU] | Freq: Three times a day (TID) | INTRAMUSCULAR | Status: DC
  Administered 2021-03-27: 1 [IU] via SUBCUTANEOUS
  Administered 2021-03-27: 3 [IU] via SUBCUTANEOUS
  Administered 2021-03-28 – 2021-03-29 (×3): 2 [IU] via SUBCUTANEOUS
  Administered 2021-03-29: 3 [IU] via SUBCUTANEOUS
  Administered 2021-03-30 – 2021-03-31 (×4): 2 [IU] via SUBCUTANEOUS

## 2021-03-26 MED ORDER — PANTOPRAZOLE SODIUM 40 MG PO TBEC
40.0000 mg | DELAYED_RELEASE_TABLET | Freq: Every day | ORAL | Status: DC
  Administered 2021-03-27 – 2021-03-31 (×5): 40 mg via ORAL
  Filled 2021-03-26 (×5): qty 1

## 2021-03-26 MED ORDER — PREDNISONE 1 MG PO TABS
1.0000 mg | ORAL_TABLET | Freq: Two times a day (BID) | ORAL | Status: DC
  Administered 2021-03-27 – 2021-03-31 (×9): 1 mg via ORAL
  Filled 2021-03-26 (×10): qty 1

## 2021-03-26 MED ORDER — DULOXETINE HCL 30 MG PO CPEP
30.0000 mg | ORAL_CAPSULE | Freq: Every morning | ORAL | Status: DC
  Administered 2021-03-27 – 2021-03-31 (×5): 30 mg via ORAL
  Filled 2021-03-26 (×5): qty 1

## 2021-03-26 MED ORDER — UMECLIDINIUM BROMIDE 62.5 MCG/ACT IN AEPB
1.0000 | INHALATION_SPRAY | Freq: Every day | RESPIRATORY_TRACT | Status: DC
  Administered 2021-03-27 – 2021-03-31 (×5): 1 via RESPIRATORY_TRACT
  Filled 2021-03-26: qty 7

## 2021-03-26 MED ORDER — INSULIN GLARGINE-YFGN 100 UNIT/ML ~~LOC~~ SOLN
5.0000 [IU] | Freq: Every day | SUBCUTANEOUS | Status: DC
  Administered 2021-03-27 – 2021-03-31 (×5): 5 [IU] via SUBCUTANEOUS
  Filled 2021-03-26 (×5): qty 0.05

## 2021-03-26 MED ORDER — SODIUM CHLORIDE 0.9 % IV SOLN
1.0000 g | INTRAVENOUS | Status: DC
  Administered 2021-03-27 – 2021-03-30 (×4): 1 g via INTRAVENOUS
  Filled 2021-03-26 (×6): qty 10

## 2021-03-26 MED ORDER — INSULIN ASPART 100 UNIT/ML IJ SOLN: 0.0000 [IU] | Freq: Every day | INTRAMUSCULAR | Status: DC

## 2021-03-26 NOTE — ED Notes (Signed)
[facility] here for transport, attempted report, no answer

## 2021-03-26 NOTE — H&P (Signed)
History and Physical    Kayla Thompson:[id number] DOB: [birth year] DOA: 03/26/2021  PCP: [name], MD Patient coming from: [facility]  Chief Complaint: Fever  HPI: Kayla Thompson is a 86 y.o. female with medical history significant of chronic diastolic CHF, hypertension, PAD, frequent UTIs, PMR on chronic steroids, asthma, insulin-dependent type 2 diabetes, hyperlipidemia, chronic constipation, hypothyroidism, depression, anxiety, fibromyalgia, osteoporosis, chronic pain, GERD, history of ovarian cancer status post TAH/BSO presented to the ED for evaluation of fever, URI symptoms, generalized weakness, somnolence, and poor oral intake.  In the ED, slightly tachycardic but not febrile or hypotensive.  Not hypoxic.  SARS-CoV-2 PCR test positive.  WBC 15.6.  Hemoglobin 10.2, stable.  Sodium 133, chronically low.  Creatinine 0.8, stable.  ALT 92, remainder of LFTs normal.  Lactic acid normal x2.  UA with negative nitrite, large amount of leukocytes, and 21-50 WBCs.  Urine culture pending.  Blood culture x2 drawn.  Chest x-ray negative for acute finding. Patient was given ceftriaxone, 1 L LR bolus, remdesivir, and Tylenol.  Patient is confused and not able to give any history.  No family at bedside.  Review of Systems:  All systems reviewed and apart from history of presenting illness, are negative.  Past Medical History:  Diagnosis Date   Anxiety    Arthritis    Asthma    Cataracts, bilateral    removed by surgery   COPD (chronic obstructive pulmonary disease) ([place])    Depression    Diabetes mellitus without complication (HCC)    type 2   Dyspnea    with exertion   Fibromyalgia    GERD (gastroesophageal reflux disease)    History of chemotherapy    History of fractured pelvis    Hypertension    Hypothyroidism    Osteoporosis 12/2017   T score -2.8 stable from prior DEXA   Osteoporosis    Ovarian cancer ([place])    Pneumonia    several times    Past Surgical History:   Procedure Laterality Date   ABDOMINAL HYSTERECTOMY  1996   ANKLE FRACTURE SURGERY     plate and screws   BIOPSY  02/05/2019   Procedure: BIOPSY;  Surgeon: Irving Copas., MD;  Location: [facility] ENDOSCOPY;  Service: Gastroenterology;;   BLADDER SUSPENSION     CATARACT EXTRACTION     COLONOSCOPY     ESOPHAGEAL BRUSHING  02/05/2019   Procedure: ESOPHAGEAL BRUSHING;  Surgeon: Irving Copas., MD;  Location: [facility] ENDOSCOPY;  Service: Gastroenterology;;   ESOPHAGOGASTRODUODENOSCOPY (EGD) WITH PROPOFOL N/A 02/05/2019   Procedure: ESOPHAGOGASTRODUODENOSCOPY (EGD) WITH PROPOFOL with dialtion;  Surgeon: Irving Copas., MD;  Location: [place];  Service: Gastroenterology;  Laterality: N/A;   EYE SURGERY     cataracts removed-bilateral   hip surgey     knee surgey     LUNG SURGERY     OOPHORECTOMY     BSO   SAVORY DILATION N/A 02/05/2019   Procedure: SAVORY DILATION;  Surgeon: Irving Copas., MD;  Location: [facility];  Service: Gastroenterology;  Laterality: N/A;   TONSILLECTOMY     UPPER GI ENDOSCOPY       reports that she has never smoked. She has never been exposed to tobacco smoke. She has never used smokeless tobacco. She reports that she does not drink alcohol and does not use drugs.  Allergies  Allergen Reactions   Tape Other (See Comments)    SKIN IS VERY THIN AND DELICATE- will tear like paper!!  Lasix [Furosemide] Other (See Comments)    Confusion- MD stopped this med   Latex Rash   Penicillins Rash    Has patient had a PCN reaction causing immediate rash, facial/tongue/throat swelling, SOB or lightheadedness with hypotension: No Has patient had a PCN reaction causing severe rash involving mucus membranes or skin necrosis: No Has patient had a PCN reaction that required hospitalization: No Has patient had a PCN reaction occurring within the last 10 years: No If all of the above answers are "NO", then may proceed with Cephalosporin use.      Family History  Problem Relation Age of Onset   Lung cancer Mother    CAD Father    Diabetes Father    Lung cancer Maternal Aunt    Diabetes Paternal Aunt    Diabetes Paternal Uncle    Diabetes Paternal Grandmother    Diabetes Paternal Grandfather    Colon cancer Neg Hx    Esophageal cancer Neg Hx    Inflammatory bowel disease Neg Hx    Liver disease Neg Hx    Pancreatic cancer Neg Hx    Rectal cancer Neg Hx    Stomach cancer Neg Hx     Prior to Admission medications   Medication Sig Start Date End Date Taking? Authorizing Provider  Apoaequorin (PREVAGEN PO) Take 1 tablet by mouth every evening.   Yes [provider]  Ascorbic Acid (VITAMIN C) 1000 MG tablet Take 1,000 mg by mouth in the morning and at bedtime.   Yes [provider]  baclofen (LIORESAL) 10 MG tablet TAKE 0.5-1 TABLET BY MOUTH TWICE DAILY AS NEEDED FOR MUSCLE SPASMS OR PAIN 02/09/21  Yes [name], MD  Calcium Carbonate (CALTRATE 600 PO) Take 600 mg by mouth in the morning and at bedtime.   Yes [provider]  cholecalciferol (VITAMIN D3) 25 MCG (1000 UNIT) tablet Take 2,000 Units by mouth in the morning.   Yes [provider]  Cyanocobalamin (VITAMIN B 12 PO) Take 2,500 mcg by mouth every Thursday.   Yes [provider]  Dextromethorphan-guaiFENesin (MUCINEX DM MAXIMUM STRENGTH) 60-1200 MG TB12 Take 1 tablet by mouth in the morning and at bedtime.   Yes [provider]  docusate sodium (COLACE) 100 MG capsule Take 1 capsule (100 mg total) by mouth daily. 01/28/21  Yes [name], MD  DULoxetine (CYMBALTA) 30 MG capsule Take 1 capsule (30 mg total) by mouth daily. Patient taking differently: Take 30 mg by mouth in the morning. 05/19/20  Yes [name], MD  DULoxetine (CYMBALTA) 60 MG capsule Take 1 capsule (60 mg total) by mouth daily. Patient taking differently: Take 60 mg by mouth in the morning. 05/19/20  Yes [name], MD  estradiol (ESTRACE) 0.1 MG/GM vaginal cream Place 1 Applicatorful vaginally See admin instructions. Takes 2 times a week 06/28/20  Yes [provider]  gabapentin (NEURONTIN) 100 MG capsule Take 100 mg by mouth at bedtime. 11/03/20  Yes [provider]  insulin glargine (LANTUS) 100 UNIT/ML Solostar Pen Inject 5 Units into the skin in the morning.   Yes [provider]  ipratropium-albuterol (DUONEB) 0.5-2.5 (3) MG/3ML SOLN Inhale 3 mLs into the lungs every 4 (four) hours as needed. 01/19/21  Yes Baird Lyons D, MD  levothyroxine (SYNTHROID) 50 MCG tablet TAKE 1 TABLET BY MOUTH EVERY DAY BEFORE BREAKFAST 03/25/21  Yes [name], MD  methenamine (MANDELAMINE) 1 g tablet Take 1,000 mg by mouth in the  morning and at bedtime.   Yes [provider]  mirtazapine (REMERON) 15 MG tablet TAKE 1 TABLET BY MOUTH EVERYDAY AT BEDTIME 03/20/21  Yes [name], MD  Misc Natural Products (TART CHERRY ADVANCED PO) Take 1,000 mg by mouth every evening.   Yes [provider]  montelukast (SINGULAIR) 10 MG tablet TAKE 1 TABLET BY MOUTH EVERY DAY 02/07/21  Yes Young, Clinton D, MD  nitrofurantoin, macrocrystal-monohydrate, (MACROBID) 100 MG capsule Take 100 mg by mouth daily. 11/15/20  Yes [provider]  omeprazole (PRILOSEC) 20 MG capsule TAKE 1 CAPSULE TWICE A DAY BEFORE A MEAL Patient taking differently: Take 20 mg by mouth 2 (two) times daily before a meal. 10/03/20  Yes Mansouraty, Telford Nab., MD  oxyCODONE-acetaminophen (PERCOCET) 7.5-325 MG tablet Take 1 tablet by mouth See admin instructions. Take 1 tablet by mouth every 4-5 hours (IN CONJUNCTION WITH 1,300 mg of Tylenol) 10/23/20  Yes [provider]  polyethylene glycol (MIRALAX / GLYCOLAX) 17 g packet Take 17 g by mouth daily.   Yes [provider]  predniSONE (DELTASONE) 1 MG tablet Take 2 tablets (2 mg total) by mouth daily. Patient taking differently:  Take 1 mg by mouth in the morning and at bedtime. 07/25/18  Yes [name], MD  rosuvastatin (CRESTOR) 10 MG tablet Take 1 tablet (10 mg total) by mouth every evening. 05/19/20  Yes [name], MD  TRELEGY ELLIPTA 100-62.5-25 MCG/INH AEPB INHALE 1 PUFF INTO THE LUNGS DAILY. RINSE MOUTH Patient taking differently: Inhale 1 puff into the lungs in the morning. Rinse mouth 07/05/20  Yes Young, Tarri Fuller D, MD  TURMERIC PO Take 1,000 mg by mouth in the morning.   Yes [provider]  acetaminophen (TYLENOL) 650 MG CR tablet Take 1,300 mg by mouth See admin instructions. Take 1,300 mg by mouth every 4-5 hours IN CONJUNCTION WITH one Percocet 7.5/325 tablet- for pain    [provider]  albuterol (PROAIR HFA) 108 (90 Base) MCG/ACT inhaler Inhale 2 puffs into the lungs every 6 (six) hours as needed for wheezing or shortness of breath. 08/27/19   Deneise Lever, MD  BD PEN NEEDLE NANO 2ND GEN 32G X 4 MM MISC USE AS DIRECTED 4 TIMES A DAY 10/22/20   [name], MD  busPIRone (BUSPAR) 5 MG tablet Take 5-10 mg by mouth daily as needed (for anxiety).    [provider]  clotrimazole-betamethasone (LOTRISONE) cream APPLY TO AFFECTED AREA TWICE A DAY Patient not taking: Reported on 03/26/2021 02/26/20   [name], MD  denosumab (PROLIA) 60 MG/ML SOSY injection Inject 60 mg into the skin every 6 (six) months.    [provider]  feeding supplement (ENSURE ENLIVE / ENSURE PLUS) LIQD Take 237 mLs by mouth 2 (two) times daily between meals. Patient taking differently: Take 237 mLs by mouth 3 (three) times daily with meals. 01/16/20   Hongalgi, Everlene Farrier D, MD  glucose blood (FREESTYLE LITE) test strip USE TO TEST BLOOD SUGAR 3 TIMES DAILY. 09/19/20   Philemon Kingdom, MD  hydrocortisone (ANUSOL-HC) 25 MG suppository Place 1 suppository (25 mg total) rectally 2 (two) times daily. Patient not taking: Reported on 03/26/2021 01/12/21   Thurnell Lose, MD   insulin lispro (HUMALOG KWIKPEN) 100 UNIT/ML KwikPen Inject 2-4 Units into the skin daily before supper. Patient taking differently: Inject 2 Units into the skin See admin instructions. Inject 2 units into the skin before meals as needed for a low BGL  12/18/20   Philemon Kingdom, MD  Lancets (FREESTYLE) lancets Use to test blood sugar 3 times daily. Dx: E11.65 10/14/16   Philemon Kingdom, MD  linaclotide Rolan Lipa) 72 MCG capsule Take 1 capsule (72 mcg total) by mouth daily before breakfast. 03/11/21   Biagio Borg, MD  megestrol (MEGACE) 40 MG tablet Take 1 tablet (40 mg total) by mouth daily. 03/11/21   Biagio Borg, MD  naloxone [facility] 121) nasal spray 4 mg/0.1 mL Place 1 spray into the nose once as needed (overdose). 06/27/20   [provider]  senna (SENOKOT) 8.6 MG TABS tablet Take 1 tablet (8.6 mg total) by mouth daily as needed for moderate constipation. Patient not taking: Reported on 03/26/2021 01/28/21   [name], MD    Physical Exam: Vitals:   03/26/21 2000 03/26/21 2029 03/26/21 2229 03/27/21 0030  BP: (!) 141/90  (!) 150/89 (!) 144/82  Pulse: (!) 113  (!) 115 (!) 110  Resp: 20  16 16   Temp: 99.5 F (37.5 C)  98.9 F (37.2 C) 98.6 F (37 C)  TempSrc:   Oral Oral  SpO2: 97%  97% 97%  Weight:  41.9 kg    Height:  4\' 11"  (1.499 m)      Physical Exam Constitutional:      General: She is not in acute distress. HENT:     Head: Normocephalic and atraumatic.     Mouth/Throat:     Mouth: Mucous membranes are dry.  Eyes:     Extraocular Movements: Extraocular movements intact.     Conjunctiva/sclera: Conjunctivae normal.  Cardiovascular:     Rate and Rhythm: Regular rhythm. Tachycardia present.     Pulses: Normal pulses.  Pulmonary:     Effort: Pulmonary effort is normal. No respiratory distress.     Breath sounds: No wheezing or rales.  Abdominal:     General: Bowel sounds are normal. There is no distension.     Palpations: Abdomen is soft.      Tenderness: There is no abdominal tenderness.  Musculoskeletal:        General: No swelling or tenderness.     Cervical back: Normal range of motion and neck supple.  Skin:    General: Skin is warm and dry.  Neurological:     Mental Status: She is alert.     Cranial Nerves: No cranial nerve deficit.     Sensory: No sensory deficit.     Motor: No weakness.     Comments: Oriented to self only     Labs on Admission: I have personally reviewed following labs and imaging studies  CBC: Recent Labs  Lab 03/26/21 1141 03/27/21 0048  WBC 15.6* 14.4*  NEUTROABS 11.1*  --   HGB 10.2* 9.1*  HCT 30.5* 26.8*  MCV 107.4* 109.8*  PLT 405* 086   Basic Metabolic Panel: Recent Labs  Lab 03/26/21 1141 03/27/21 0048  NA 133* 131*  K 3.7 3.3*  CL 98 100  CO2 24 22  GLUCOSE 164* 133*  BUN 19 14  CREATININE 0.80 0.65  CALCIUM 10.0 8.6*   GFR: Estimated Creatinine Clearance: 35.2 mL/min (by C-G formula based on SCr of 0.65 mg/dL). Liver Function Tests: Recent Labs  Lab 03/26/21 1141 03/27/21 0048  AST 29 23  ALT 92* 70*  ALKPHOS 66 53  BILITOT 0.5 0.5  PROT 7.1 6.2*  ALBUMIN 3.8 2.8*   No results for input(s): LIPASE, AMYLASE in the last 168 hours. No results for input(s): AMMONIA  in the last 168 hours. Coagulation Profile: No results for input(s): INR, PROTIME in the last 168 hours. Cardiac Enzymes: No results for input(s): CKTOTAL, CKMB, CKMBINDEX, TROPONINI in the last 168 hours. BNP (last 3 results) Recent Labs    11/26/20 1339  PROBNP 291   HbA1C: No results for input(s): HGBA1C in the last 72 hours. CBG: Recent Labs  Lab 03/26/21 1717 03/27/21 0025  GLUCAP 131* 136*   Lipid Profile: No results for input(s): CHOL, HDL, LDLCALC, TRIG, CHOLHDL, LDLDIRECT in the last 72 hours. Thyroid Function Tests: No results for input(s): TSH, T4TOTAL, FREET4, T3FREE, THYROIDAB in the last 72 hours. Anemia Panel: No results for input(s): VITAMINB12, FOLATE, FERRITIN,  TIBC, IRON, RETICCTPCT in the last 72 hours. Urine analysis:    Component Value Date/Time   COLORURINE YELLOW 03/26/2021 1336   APPEARANCEUR HAZY (A) 03/26/2021 1336   LABSPEC 1.017 03/26/2021 1336   PHURINE 6.0 03/26/2021 1336   GLUCOSEU NEGATIVE 03/26/2021 1336   GLUCOSEU NEGATIVE 07/17/2020 0939   HGBUR NEGATIVE 03/26/2021 1336   BILIRUBINUR NEGATIVE 03/26/2021 1336   BILIRUBINUR 1+ 06/13/2019 1531   KETONESUR NEGATIVE 03/26/2021 1336   PROTEINUR 30 (A) 03/26/2021 1336   UROBILINOGEN 0.2 07/17/2020 0939   NITRITE NEGATIVE 03/26/2021 1336   LEUKOCYTESUR LARGE (A) 03/26/2021 1336    Radiological Exams on Admission: DG Chest Port 1 View  Result Date: 03/26/2021 CLINICAL DATA:  Fevers EXAM: PORTABLE CHEST 1 VIEW COMPARISON:  Chest x-ray 11/26/2020 FINDINGS: Heart size is normal. Mediastinum appears stable. Calcified plaques in the aortic arch. Chronic prominent interstitial lung markings with no new focal consolidation identified. No pleural effusion or pneumothorax visualized. Severe degenerative changes of the shoulders noted. IMPRESSION: Chronic changes with no acute process identified. Electronically Signed   By: Ofilia Neas M.D.   On: 03/26/2021 12:29    Assessment/Plan Principal Problem:   COVID-19 Active Problems:   Asthma, chronic   UTI (urinary tract infection)   Acute metabolic encephalopathy   Severe sepsis ([place])   COVID-19 viral infection Patient here for evaluation of fever and URI symptoms.  SARS-CoV-2 PCR test positive.  Her vaccination status is unknown at this time.  Not hypoxic.  Chest x-ray not suggestive of pneumonia. -Remdesivir -No indication for steroids at this time -Antitussive as needed -Check inflammatory markers including ferritin, fibrinogen, D-dimer, CRP, LDH -Airborne and contact precautions -Continuous pulse ox -Supplemental oxygen as needed to keep oxygen saturation above 90% -Blood culture x2 pending   Possible UTI Labs showing  leukocytosis. UA with negative nitrite, large amount of leukocytes, and 21-50 WBCs.   -Continue ceftriaxone.  Urine culture pending.  Severe sepsis Meets criteria for severe sepsis with tachycardia, leukocytosis, acute encephalopathy, and source of infection including COVID-19 viral infection and possible UTI.  No lactic acidosis or hypotension. -IV fluid hydration.  Continue remdesivir and ceftriaxone as mentioned above.  Blood and urine cultures pending.  Monitor WBC count.  Acute metabolic encephalopathy Likely due to conditions listed above.  Patient is oriented to self only.  No gross focal neurodeficit. -Stat CT head without contrast.  Check TSH, B12, and ammonia levels.  Mild elevation of transaminase Likely due to acute viral illness. -Continue to monitor  Generalized weakness -PT/OT eval, fall precautions  Chronic diastolic CHF No signs of volume overload. -Monitor volume status closely  PMR -Continue home prednisone  Asthma Stable.  No signs of acute exacerbation.   -Continue Singulair and home inhalers  Insulin-dependent type 2 diabetes Well-controlled-A1c 6.4 in September 2022. -Repeat  A1c.  Continue Semglee 5 units daily.  Sliding scale insulin sensitive ACHS.   Hyperlipidemia -Continue Crestor  Chronic constipation -Continue Colace, MiraLAX  Hypothyroidism -Continue Synthroid and check TSH level  Depression -Continue Cymbalta, mirtazapine  Chronic pain -Continue gabapentin  DVT prophylaxis: Lovenox Code Status: DNR per review of records from prior hospitalizations.  There is a signed DNR form present in the electronic chart under [employer] platform.  Please confirm with family in the morning. Family Communication: No family available at this time. Disposition Plan: Status is: Inpatient  Remains inpatient appropriate because: Severe sepsis  Level of care: Level of care: Telemetry  The medical decision making on this patient was of high complexity and  the patient is at high risk for clinical deterioration, therefore this is a level 3 visit.  Shela Leff MD [facility]  If 7PM-7AM, please contact night-coverage [email or web address]  03/27/2021, 1:33 AM

## 2021-03-26 NOTE — Plan of Care (Signed)
Kayla Thompson MRN: [id number]  [place]: [facility] Requesting Physician/APP: Charlesetta Shanks, MD  History: 86 year old female who presented to [place] with confusion, reported fevers and was found to be COVID-19 positive with also concern for urinalysis.  At baseline, lives at home with caregivers/family members, and able to ambulate with assistance at baseline.  Evaluation in the ED, elevated WBC count of 15.6, COVID-19 positive, urinalysis with large leukocytes, negative nitrite, 21-50 WBCs.  Chest x-ray with no acute cardiopulmonary disease process.  Patient was started on IV fluid hydration, IV ceftriaxone and given remdesivir.  Plan of Care:  Accepted to med/surg bed under observation status at [facility] or [place], once bed available.  Recommend continue remdesivir x3 days, ceftriaxone and follow urine culture.  Will need PT/OT evaluation on arrival.  Care per ED physician/provider until patient arrives for hospitalist evaluation at [facility] or [facility].  Please call the [facility] [place] number at the top of Amion at the time of the patient's arrival to the floor.  Kayla Thompson [place] DO

## 2021-03-26 NOTE — Progress Notes (Addendum)
Patient arrived to the unit from [place]. Paged admitting.

## 2021-03-26 NOTE — ED Provider Notes (Signed)
[facility] EMERGENCY DEPT Provider Note   CSN: [id number] Arrival date & time: 03/26/21  1112     History  Chief Complaint  Patient presents with   Fever    Kayla Thompson is a 87 y.o. female.  HPI Patient has had some failure to thrive over the past weeks or months per the patient's granddaughter who is at bedside.  However, at baseline patient is interactive verbally and sits up in her recliner to eat her meals.  Over the past 24 hours the patient has become acutely much more ill.  She has been noted to have a cough for a while but does at baseline have COPD.  Family members and caregivers had not noticed dramatic increase in coughing.  She did have some complaints of nasal congestion.  She was found to have a fever of 102.5 at home.  Patient become much more somnolent and generally weak.  Today she has not taken anything to eat or drink and has not been able to get out of bed.  Patient cannot add anything to the history.  Patient's caregiver reports that about 3 or 4 days ago one of the other caregivers reported the patient complained of some pain with urination but when the current caregiver had been asking her about it for the past 2 days patient denied any pain.  Caregiver reports she was trying to increase her hydration and fluid intake for the past 2 days but today she would take nothing.    Home Medications Prior to Admission medications   Medication Sig Start Date End Date Taking? Authorizing Provider  Apoaequorin (PREVAGEN PO) Take 1 tablet by mouth every evening.   Yes [provider]  Ascorbic Acid (VITAMIN C) 1000 MG tablet Take 1,000 mg by mouth in the morning and at bedtime.   Yes [provider]  baclofen (LIORESAL) 10 MG tablet TAKE 0.5-1 TABLET BY MOUTH TWICE DAILY AS NEEDED FOR MUSCLE SPASMS OR PAIN 02/09/21  Yes [name], MD  Calcium Carbonate (CALTRATE 600 PO) Take 600 mg by mouth in the morning and at bedtime.   Yes  [provider]  cholecalciferol (VITAMIN D3) 25 MCG (1000 UNIT) tablet Take 2,000 Units by mouth in the morning.   Yes [provider]  Cyanocobalamin (VITAMIN B 12 PO) Take 2,500 mcg by mouth every Thursday.   Yes [provider]  Dextromethorphan-guaiFENesin (MUCINEX DM MAXIMUM STRENGTH) 60-1200 MG TB12 Take 1 tablet by mouth in the morning and at bedtime.   Yes [provider]  docusate sodium (COLACE) 100 MG capsule Take 1 capsule (100 mg total) by mouth daily. 01/28/21  Yes [name], MD  DULoxetine (CYMBALTA) 30 MG capsule Take 1 capsule (30 mg total) by mouth daily. Patient taking differently: Take 30 mg by mouth in the morning. 05/19/20  Yes [name], MD  DULoxetine (CYMBALTA) 60 MG capsule Take 1 capsule (60 mg total) by mouth daily. Patient taking differently: Take 60 mg by mouth in the morning. 05/19/20  Yes [name], MD  estradiol (ESTRACE) 0.1 MG/GM vaginal cream Place 1 Applicatorful vaginally See admin instructions. Takes 2 times a week 06/28/20  Yes [provider]  gabapentin (NEURONTIN) 100 MG capsule Take 100 mg by mouth at bedtime. 11/03/20  Yes [provider]  insulin glargine (LANTUS) 100 UNIT/ML Solostar Pen Inject 5 Units into the skin in the morning.   Yes [provider]  ipratropium-albuterol (DUONEB) 0.5-2.5 (3) MG/3ML SOLN  Inhale 3 mLs into the lungs every 4 (four) hours as needed. 01/19/21  Yes Baird Lyons D, MD  levothyroxine (SYNTHROID) 50 MCG tablet TAKE 1 TABLET BY MOUTH EVERY DAY BEFORE BREAKFAST 03/25/21  Yes [name], MD  methenamine (MANDELAMINE) 1 g tablet Take 1,000 mg by mouth in the morning and at bedtime.   Yes [provider]  mirtazapine (REMERON) 15 MG tablet TAKE 1 TABLET BY MOUTH EVERYDAY AT BEDTIME 03/20/21  Yes [name], MD  Misc Natural Products (TART CHERRY ADVANCED PO) Take 1,000 mg by mouth every evening.   Yes  [provider]  montelukast (SINGULAIR) 10 MG tablet TAKE 1 TABLET BY MOUTH EVERY DAY 02/07/21  Yes Young, Clinton D, MD  nitrofurantoin, macrocrystal-monohydrate, (MACROBID) 100 MG capsule Take 100 mg by mouth daily. 11/15/20  Yes [provider]  omeprazole (PRILOSEC) 20 MG capsule TAKE 1 CAPSULE TWICE A DAY BEFORE A MEAL Patient taking differently: Take 20 mg by mouth 2 (two) times daily before a meal. 10/03/20  Yes Mansouraty, Telford Nab., MD  oxyCODONE-acetaminophen (PERCOCET) 7.5-325 MG tablet Take 1 tablet by mouth See admin instructions. Take 1 tablet by mouth every 4-5 hours (IN CONJUNCTION WITH 1,300 mg of Tylenol) 10/23/20  Yes [provider]  polyethylene glycol (MIRALAX / GLYCOLAX) 17 g packet Take 17 g by mouth daily.   Yes [provider]  predniSONE (DELTASONE) 1 MG tablet Take 2 tablets (2 mg total) by mouth daily. Patient taking differently: Take 1 mg by mouth in the morning and at bedtime. 07/25/18  Yes [name], MD  rosuvastatin (CRESTOR) 10 MG tablet Take 1 tablet (10 mg total) by mouth every evening. 05/19/20  Yes [name], MD  TRELEGY ELLIPTA 100-62.5-25 MCG/INH AEPB INHALE 1 PUFF INTO THE LUNGS DAILY. RINSE MOUTH Patient taking differently: Inhale 1 puff into the lungs in the morning. Rinse mouth 07/05/20  Yes Young, Tarri Fuller D, MD  TURMERIC PO Take 1,000 mg by mouth in the morning.   Yes [provider]  acetaminophen (TYLENOL) 650 MG CR tablet Take 1,300 mg by mouth See admin instructions. Take 1,300 mg by mouth every 4-5 hours IN CONJUNCTION WITH one Percocet 7.5/325 tablet- for pain    [provider]  albuterol (PROAIR HFA) 108 (90 Base) MCG/ACT inhaler Inhale 2 puffs into the lungs every 6 (six) hours as needed for wheezing or shortness of breath. 08/27/19   Deneise Lever, MD  BD PEN NEEDLE NANO 2ND GEN 32G X 4 MM MISC USE AS DIRECTED 4 TIMES A DAY 10/22/20   [name], MD  busPIRone  (BUSPAR) 5 MG tablet Take 5-10 mg by mouth daily as needed (for anxiety).    [provider]  clotrimazole-betamethasone (LOTRISONE) cream APPLY TO AFFECTED AREA TWICE A DAY Patient not taking: Reported on 03/26/2021 02/26/20   [name], MD  denosumab (PROLIA) 60 MG/ML SOSY injection Inject 60 mg into the skin every 6 (six) months.    [provider]  feeding supplement (ENSURE ENLIVE / ENSURE PLUS) LIQD Take 237 mLs by mouth 2 (two) times daily between meals. Patient taking differently: Take 237 mLs by mouth 3 (three) times daily with meals. 01/16/20   Hongalgi, Everlene Farrier D, MD  glucose blood (FREESTYLE LITE) test strip USE TO TEST BLOOD SUGAR 3 TIMES DAILY. 09/19/20   Philemon Kingdom, MD  hydrocortisone (ANUSOL-HC) 25 MG suppository Place 1 suppository (25 mg total) rectally 2 (two) times daily. Patient not  taking: Reported on 03/26/2021 01/12/21   Thurnell Lose, MD  insulin lispro (HUMALOG KWIKPEN) 100 UNIT/ML KwikPen Inject 2-4 Units into the skin daily before supper. Patient taking differently: Inject 2 Units into the skin See admin instructions. Inject 2 units into the skin before meals as needed for a low BGL 12/18/20   Philemon Kingdom, MD  Lancets (FREESTYLE) lancets Use to test blood sugar 3 times daily. Dx: E11.65 10/14/16   Philemon Kingdom, MD  linaclotide Rolan Lipa) 72 MCG capsule Take 1 capsule (72 mcg total) by mouth daily before breakfast. 03/11/21   Biagio Borg, MD  megestrol (MEGACE) 40 MG tablet Take 1 tablet (40 mg total) by mouth daily. 03/11/21   Biagio Borg, MD  naloxone [facility]) nasal spray 4 mg/0.1 mL Place 1 spray into the nose once as needed (overdose). 06/27/20   [provider]  senna (SENOKOT) 8.6 MG TABS tablet Take 1 tablet (8.6 mg total) by mouth daily as needed for moderate constipation. Patient not taking: Reported on 03/26/2021 01/28/21   [name], MD      Allergies    Tape, Lasix [furosemide], Latex, and  Penicillins    Review of Systems   Review of Systems Level 5 caveat cannot obtain review of systems due to patient condition Physical Exam Updated Vital Signs BP (!) 119/58    Pulse 87    Temp 98.6 F (37 C) (Oral)    Resp 20    LMP  (LMP Unknown)    SpO2 97%  Physical Exam Constitutional:      Comments: Patient is sleeping.  She does not exhibit respiratory distress at rest.  She is very somnolent and slow to wake up.  When awake she will attempt some eye contact and answer simple questions.  HENT:     Head: Normocephalic and atraumatic.     Mouth/Throat:     Mouth: Mucous membranes are dry.     Pharynx: Oropharynx is clear.  Eyes:     Extraocular Movements: Extraocular movements intact.  Cardiovascular:     Rate and Rhythm: Normal rate and regular rhythm.  Pulmonary:     Effort: Pulmonary effort is normal.     Comments: Breath sounds clear to anterior auscultation.  Soft at the bases.  No gross rhonchi or wheeze. Abdominal:     General: There is no distension.     Palpations: Abdomen is soft.     Tenderness: There is no abdominal tenderness. There is no guarding.  Musculoskeletal:     Comments: No significant peripheral edema.  The lower legs and feet are in good condition without any active wounds.  Skin:    General: Skin is warm and dry.  Neurological:     Comments: Patient is somnolent.  She makes very limited 1 or 2 word answers.  No obvious focal motor deficits.  As I examined and move her extremities she can respond with movement.    ED Results / Procedures / Treatments   Labs (all labs ordered are listed, but only abnormal results are displayed) Labs Reviewed  RESP PANEL BY RT-PCR (FLU A&B, COVID) ARPGX2 - Abnormal; Notable for the following components:      Result Value   SARS Coronavirus 2 by RT PCR POSITIVE (*)    All other components within normal limits  COMPREHENSIVE METABOLIC PANEL - Abnormal; Notable for the following components:   Sodium 133 (*)     Glucose, Bld 164 (*)    ALT 92 (*)  All other components within normal limits  CBC WITH DIFFERENTIAL/PLATELET - Abnormal; Notable for the following components:   WBC 15.6 (*)    RBC 2.84 (*)    Hemoglobin 10.2 (*)    HCT 30.5 (*)    MCV 107.4 (*)    MCH 35.9 (*)    Platelets 405 (*)    Neutro Abs 11.1 (*)    Abs Immature Granulocytes 0.13 (*)    All other components within normal limits  URINALYSIS, ROUTINE W REFLEX MICROSCOPIC - Abnormal; Notable for the following components:   APPearance HAZY (*)    Protein, ur 30 (*)    Leukocytes,Ua LARGE (*)    All other components within normal limits  URINE CULTURE  CULTURE, BLOOD (ROUTINE X 2)  CULTURE, BLOOD (ROUTINE X 2)  URINE CULTURE  LACTIC ACID, PLASMA  LACTIC ACID, PLASMA    EKG None  Radiology DG Chest Port 1 View  Result Date: 03/26/2021 CLINICAL DATA:  Fevers EXAM: PORTABLE CHEST 1 VIEW COMPARISON:  Chest x-ray 11/26/2020 FINDINGS: Heart size is normal. Mediastinum appears stable. Calcified plaques in the aortic arch. Chronic prominent interstitial lung markings with no new focal consolidation identified. No pleural effusion or pneumothorax visualized. Severe degenerative changes of the shoulders noted. IMPRESSION: Chronic changes with no acute process identified. Electronically Signed   By: Ofilia Neas M.D.   On: 03/26/2021 12:29    Procedures Procedures    Medications Ordered in ED Medications  lactated ringers infusion (has no administration in time range)  cefTRIAXone (ROCEPHIN) 1 g in sodium chloride 0.9 % 100 mL IVPB (1 g Intravenous New Bag/Given 03/26/21 1603)  remdesivir 100 mg in sodium chloride 0.9 % 100 mL IVPB (has no administration in time range)  remdesivir 100 mg in sodium chloride 0.9 % 100 mL IVPB (has no administration in time range)  acetaminophen (TYLENOL) tablet 650 mg (650 mg Oral Given 03/26/21 1146)  lactated ringers bolus 1,000 mL (1,000 mLs Intravenous New Bag/Given 03/26/21 1557)    ED  Course/ Medical Decision Making/ A&P Clinical Course as of 03/26/21 1608  Thu Mar 26, 2021  1519 Consult: [facility] hospitalist accepts for admission [MP]    Clinical Course User Index [MP] Charlesetta Shanks, MD                           Medical Decision Making Patient presents as outlined.  Differential diagnosis includes sepsis\metabolic encephalopathy\dehydration\failure to thrive.  Patient has been febrile.  Diagnostic evaluation is returning positive for COVID and UTI.  All labs and imaging reviewed by myself.  Results are reviewed extensively with family member at bedside.  Patient's presentation includes lethargy and mental status change.  Will initiate rehydration with lactated Ringer's, treatment for UTI with ceftriaxone and initiation of remdesivir with patient being positive for COVID.  Currently she is not exhibiting respiratory distress and chest x-ray does not show infiltrate.  Patient not currently requiring oxygen or respiratory support.  Consult: [facility] hospitalist Dr. Gilman Buttner for admission   Final Clinical Impression(s) / ED Diagnoses Final diagnoses:  COVID  Dehydration  Acute cystitis without hematuria    Rx / DC Orders ED Discharge Orders     None         Charlesetta Shanks, MD 03/26/21 1610

## 2021-03-26 NOTE — ED Notes (Signed)
Report given to [facility] RN on 3W

## 2021-03-26 NOTE — ED Triage Notes (Signed)
Patient reports to the ER for fevers and not feeling well. Patient reports she is stuffy and had a 102.5 fever at home, has not yet had anything for fever at this time.

## 2021-03-26 NOTE — Progress Notes (Signed)
Per Dr. Marlowe Sax patient's heart rate is o.k. in the one-teens,. Notify if heart rate increases.

## 2021-03-26 NOTE — ED Notes (Cosign Needed)
Pt alert to herself and birthrate. Pt gave her location as [place] and wass unable to give month. Granddaughter advises this is the Pt's baseline for the past 5 months. Pt lives in her on condo with 24/7 care. Pt has been to weak to ambulate on her on without assistance. Pt in past used a walker. Pt has a calm demeanor and follows commands well.

## 2021-03-26 NOTE — ED Notes (Signed)
[facility] to administer Remdesivir en route

## 2021-03-27 ENCOUNTER — Inpatient Hospital Stay (HOSPITAL_COMMUNITY): Payer: Medicare Other

## 2021-03-27 DIAGNOSIS — R2689 Other abnormalities of gait and mobility: Secondary | ICD-10-CM | POA: Diagnosis not present

## 2021-03-27 DIAGNOSIS — G8929 Other chronic pain: Secondary | ICD-10-CM | POA: Diagnosis present

## 2021-03-27 DIAGNOSIS — J4530 Mild persistent asthma, uncomplicated: Secondary | ICD-10-CM | POA: Diagnosis not present

## 2021-03-27 DIAGNOSIS — R531 Weakness: Secondary | ICD-10-CM | POA: Diagnosis not present

## 2021-03-27 DIAGNOSIS — F32A Depression, unspecified: Secondary | ICD-10-CM | POA: Diagnosis present

## 2021-03-27 DIAGNOSIS — M810 Age-related osteoporosis without current pathological fracture: Secondary | ICD-10-CM | POA: Diagnosis present

## 2021-03-27 DIAGNOSIS — R6520 Severe sepsis without septic shock: Secondary | ICD-10-CM | POA: Diagnosis present

## 2021-03-27 DIAGNOSIS — A419 Sepsis, unspecified organism: Secondary | ICD-10-CM | POA: Diagnosis present

## 2021-03-27 DIAGNOSIS — U071 COVID-19: Secondary | ICD-10-CM | POA: Diagnosis present

## 2021-03-27 DIAGNOSIS — Z743 Need for continuous supervision: Secondary | ICD-10-CM | POA: Diagnosis not present

## 2021-03-27 DIAGNOSIS — J449 Chronic obstructive pulmonary disease, unspecified: Secondary | ICD-10-CM | POA: Diagnosis present

## 2021-03-27 DIAGNOSIS — I110 Hypertensive heart disease with heart failure: Secondary | ICD-10-CM | POA: Diagnosis present

## 2021-03-27 DIAGNOSIS — G9341 Metabolic encephalopathy: Secondary | ICD-10-CM | POA: Diagnosis present

## 2021-03-27 DIAGNOSIS — I5032 Chronic diastolic (congestive) heart failure: Secondary | ICD-10-CM | POA: Diagnosis present

## 2021-03-27 DIAGNOSIS — E785 Hyperlipidemia, unspecified: Secondary | ICD-10-CM | POA: Diagnosis present

## 2021-03-27 DIAGNOSIS — E1151 Type 2 diabetes mellitus with diabetic peripheral angiopathy without gangrene: Secondary | ICD-10-CM | POA: Diagnosis present

## 2021-03-27 DIAGNOSIS — N3000 Acute cystitis without hematuria: Secondary | ICD-10-CM

## 2021-03-27 DIAGNOSIS — K5909 Other constipation: Secondary | ICD-10-CM | POA: Diagnosis present

## 2021-03-27 DIAGNOSIS — E039 Hypothyroidism, unspecified: Secondary | ICD-10-CM | POA: Diagnosis present

## 2021-03-27 DIAGNOSIS — Z888 Allergy status to other drugs, medicaments and biological substances status: Secondary | ICD-10-CM | POA: Diagnosis not present

## 2021-03-27 DIAGNOSIS — M353 Polymyalgia rheumatica: Secondary | ICD-10-CM | POA: Diagnosis present

## 2021-03-27 DIAGNOSIS — M797 Fibromyalgia: Secondary | ICD-10-CM | POA: Diagnosis present

## 2021-03-27 DIAGNOSIS — R410 Disorientation, unspecified: Secondary | ICD-10-CM | POA: Diagnosis not present

## 2021-03-27 DIAGNOSIS — K219 Gastro-esophageal reflux disease without esophagitis: Secondary | ICD-10-CM | POA: Diagnosis present

## 2021-03-27 DIAGNOSIS — Z7952 Long term (current) use of systemic steroids: Secondary | ICD-10-CM | POA: Diagnosis not present

## 2021-03-27 DIAGNOSIS — Z8543 Personal history of malignant neoplasm of ovary: Secondary | ICD-10-CM | POA: Diagnosis not present

## 2021-03-27 DIAGNOSIS — Z87440 Personal history of urinary (tract) infections: Secondary | ICD-10-CM | POA: Diagnosis not present

## 2021-03-27 DIAGNOSIS — Z66 Do not resuscitate: Secondary | ICD-10-CM | POA: Diagnosis present

## 2021-03-27 DIAGNOSIS — Z91048 Other nonmedicinal substance allergy status: Secondary | ICD-10-CM | POA: Diagnosis not present

## 2021-03-27 DIAGNOSIS — L8990 Pressure ulcer of unspecified site, unspecified stage: Secondary | ICD-10-CM | POA: Insufficient documentation

## 2021-03-27 DIAGNOSIS — E860 Dehydration: Secondary | ICD-10-CM | POA: Diagnosis present

## 2021-03-27 LAB — COMPREHENSIVE METABOLIC PANEL
ALT: 70 U/L — ABNORMAL HIGH (ref 0–44)
AST: 23 U/L (ref 15–41)
Albumin: 2.8 g/dL — ABNORMAL LOW (ref 3.5–5.0)
Alkaline Phosphatase: 53 U/L (ref 38–126)
Anion gap: 9 (ref 5–15)
BUN: 14 mg/dL (ref 8–23)
CO2: 22 mmol/L (ref 22–32)
Calcium: 8.6 mg/dL — ABNORMAL LOW (ref 8.9–10.3)
Chloride: 100 mmol/L (ref 98–111)
Creatinine, Ser: 0.65 mg/dL (ref 0.44–1.00)
GFR, Estimated: >60 mL/min (ref >60)
Glucose, Bld: 133 mg/dL — ABNORMAL HIGH (ref 70–99)
Potassium: 3.3 mmol/L — ABNORMAL LOW (ref 3.5–5.1)
Sodium: 131 mmol/L — ABNORMAL LOW (ref 135–145)
Total Bilirubin: 0.5 mg/dL (ref 0.3–1.2)
Total Protein: 6.2 g/dL — ABNORMAL LOW (ref 6.5–8.1)

## 2021-03-27 LAB — CBC
HCT: 26.8 % — ABNORMAL LOW (ref 36.0–46.0)
Hemoglobin: 9.1 g/dL — ABNORMAL LOW (ref 12.0–15.0)
MCH: 37.3 pg — ABNORMAL HIGH (ref 26.0–34.0)
MCHC: 34 g/dL (ref 30.0–36.0)
MCV: 109.8 fL — ABNORMAL HIGH (ref 80.0–100.0)
Platelets: 332 10*3/uL (ref 150–400)
RBC: 2.44 MIL/uL — ABNORMAL LOW (ref 3.87–5.11)
RDW: 14.3 % (ref 11.5–15.5)
WBC: 14.4 10*3/uL — ABNORMAL HIGH (ref 4.0–10.5)
nRBC: 0 % (ref 0.0–0.2)

## 2021-03-27 LAB — URINE CULTURE: Culture: 80000 — AB

## 2021-03-27 LAB — GLUCOSE, CAPILLARY
Glucose-Capillary: 115 mg/dL — ABNORMAL HIGH (ref 70–99)
Glucose-Capillary: 126 mg/dL — ABNORMAL HIGH (ref 70–99)
Glucose-Capillary: 136 mg/dL — ABNORMAL HIGH (ref 70–99)
Glucose-Capillary: 172 mg/dL — ABNORMAL HIGH (ref 70–99)
Glucose-Capillary: 218 mg/dL — ABNORMAL HIGH (ref 70–99)

## 2021-03-27 LAB — VITAMIN B12: Vitamin B-12: 869 pg/mL (ref 180–914)

## 2021-03-27 LAB — AMMONIA: Ammonia: 22 umol/L (ref 9–35)

## 2021-03-27 LAB — FIBRINOGEN: Fibrinogen: 626 mg/dL — ABNORMAL HIGH (ref 210–475)

## 2021-03-27 LAB — TSH: TSH: 0.118 u[IU]/mL — ABNORMAL LOW (ref 0.350–4.500)

## 2021-03-27 LAB — FERRITIN: Ferritin: 105 ng/mL (ref 11–307)

## 2021-03-27 LAB — HEMOGLOBIN A1C
Hgb A1c MFr Bld: 6.5 % — ABNORMAL HIGH (ref 4.8–5.6)
Mean Plasma Glucose: 139.85 mg/dL

## 2021-03-27 LAB — C-REACTIVE PROTEIN: CRP: 9.6 mg/dL — ABNORMAL HIGH (ref <1.0)

## 2021-03-27 LAB — LACTATE DEHYDROGENASE: LDH: 106 U/L (ref 98–192)

## 2021-03-27 LAB — D-DIMER, QUANTITATIVE: D-Dimer, Quant: 2.15 ug/mL-FEU — ABNORMAL HIGH (ref 0.00–0.50)

## 2021-03-27 MED ORDER — POTASSIUM CHLORIDE 2 MEQ/ML IV SOLN
INTRAVENOUS | Status: DC
  Filled 2021-03-27 (×3): qty 1000

## 2021-03-27 MED ORDER — GUAIFENESIN-DM 100-10 MG/5ML PO SYRP: 5.0000 mL | ORAL_SOLUTION | ORAL | Status: DC | PRN

## 2021-03-27 MED ORDER — NYSTATIN 100000 UNIT/GM EX CREA
TOPICAL_CREAM | Freq: Two times a day (BID) | CUTANEOUS | Status: DC
  Administered 2021-03-28: 1 via TOPICAL
  Filled 2021-03-27 (×2): qty 15

## 2021-03-27 NOTE — Evaluation (Addendum)
Physical Therapy Evaluation Patient Details Name: Kayla Thompson MRN: [id number] DOB: January 24, 1936 Today's Date: 03/27/2021  History of Present Illness  Kayla Thompson is a 86 y.o. female admitted with covid19. PMH: CHF, HTN, PAD, frequent UTIs, asthma, diabetes, hyperlipidemia, hypothyroidism, depression, anxiety, fibromyalgia, osteoporosis, chronic pain, GERD, history of ovarian cancer s/p TAH/BSO   Clinical Impression  Pt admitted with above diagnosis. Per chart review, pt more conversational and interactive with family and caregivers at baseline; none present during eval. Pt currently requiring total A with supine<>sit, inches BLE 4-5 inches towards EOB then stops and declines use of BUE due to pain, though is able to move UE and squeeze therapist's hand when cued. Pt mobilizing BLE <20% when cued at hip, knee and ankle. Pt minimally interacting with therapist, using 1 word responses, requiring increased time and cues with mobility. Recommend HHPT with 24/7 support from family; if family unable to provide 24/7 assist, will need to consider SNF.  Pt currently with functional limitations due to the deficits listed below (see PT Problem List). Pt will benefit from skilled PT to increase their independence and safety with mobility to allow discharge to the venue listed below.          Recommendations for follow up therapy are one component of a multi-disciplinary discharge planning process, led by the attending physician.  Recommendations may be updated based on patient status, additional functional criteria and insurance authorization.  Follow Up Recommendations Home health PT (consider SNF if family unable to provide 24/7 assist)    Assistance Recommended at Discharge Frequent or constant Supervision/Assistance  Patient can return home with the following  A lot of help with walking and/or transfers;A lot of help with bathing/dressing/bathroom;Assistance with feeding;Assistance with  cooking/housework    Equipment Recommendations None recommended by PT  Recommendations for Other Services       Functional Status Assessment Patient has had a recent decline in their functional status and demonstrates the ability to make significant improvements in function in a reasonable and predictable amount of time.     Precautions / Restrictions Precautions Precautions: Fall Restrictions Weight Bearing Restrictions: No      Mobility  Bed Mobility Overal bed mobility: Needs Assistance Bed Mobility: Supine to Sit;Sit to Supine  Supine to sit: Total assist Sit to supine: Total assist  General bed mobility comments: heavy multimodal cues with bed mobility, inches BLE towards EOB but then stops, declines use of BUE to assist, once seated EOB requires mod A to maintain sitting EOB for limited duration    Transfers  General transfer comment: not attempted    Ambulation/Gait  General Gait Details: not attempted  Stairs            Wheelchair Mobility    Modified Rankin (Stroke Patients Only)       Balance Overall balance assessment: Needs assistance Sitting-balance support: Feet supported Sitting balance-Leahy Scale: Poor Sitting balance - Comments: mod A sitting EOB with LOB in all directions, declines UE propping to support trunk in sitting       Pertinent Vitals/Pain Pain Assessment: Faces Faces Pain Scale: Hurts a little bit Pain Location: "arms" with bed mobility Pain Descriptors / Indicators: Grimacing;Guarding Pain Intervention(s): Limited activity within patient's tolerance;Monitored during session;Repositioned    Home Living Family/patient expects to be discharged to:: Private residence Living Arrangements: Alone Available Help at Discharge: Family;Personal care attendant;Available 24 hours/day Type of Home: House Home Access: Stairs to enter Entrance Stairs-Rails: None Entrance Stairs-Number of Steps: 1 full  step to a patio, then threshold    Home Layout: One level Home Equipment: Conservation officer, nature (2 wheels);Rollator (4 wheels);Wheelchair - manual;Grab bars - tub/shower;Hand held shower head;Shower seat Additional Comments: All information obtained from chart review, pt unable to provide PLOF or baseline, no family present to confirm    Prior Function Prior Level of Function : Patient poor historian/Family not available      Hand Dominance        Extremity/Trunk Assessment   Upper Extremity Assessment Upper Extremity Assessment: Generalized weakness (able to squeeze therapist hand and move BUE freely but prefers to not move them with mobility)    Lower Extremity Assessment Lower Extremity Assessment: Generalized weakness (AROM <20% at hip, knees and ankles bil, engages minimally with bed mobility)       Communication   Communication: Other (comment) (very soft spoken, answers questions with 1 word answers)  Cognition Arousal/Alertness: Lethargic Behavior During Therapy: Flat affect Overall Cognitive Status: No family/caregiver present to determine baseline cognitive functioning  General Comments: pt opens eyes to cues but flat, responds with 1 word answers, requires increasead time and cues with 1 step commands, no family present to determine baseline        General Comments General comments (skin integrity, edema, etc.): HR up to 110 max noted, SpO2 >92% on RA    Exercises     Assessment/Plan    PT Assessment Patient needs continued PT services  PT Problem List Decreased strength;Decreased range of motion;Decreased activity tolerance;Decreased balance;Decreased mobility;Decreased cognition;Cardiopulmonary status limiting activity       PT Treatment Interventions DME instruction;Gait training;Functional mobility training;Therapeutic activities;Therapeutic exercise;Balance training;Patient/family education    PT Goals (Current goals can be found in the Care Plan section)  Acute Rehab PT Goals Patient Stated  Goal: none stated PT Goal Formulation: Patient unable to participate in goal setting Time For Goal Achievement: 04/10/21 Potential to Achieve Goals: Fair    Frequency Min 2X/week     Co-evaluation               AM-PAC PT "6 Clicks" Mobility  Outcome Measure Help needed turning from your back to your side while in a flat bed without using bedrails?: Total Help needed moving from lying on your back to sitting on the side of a flat bed without using bedrails?: Total Help needed moving to and from a bed to a chair (including a wheelchair)?: Total Help needed standing up from a chair using your arms (e.g., wheelchair or bedside chair)?: Total Help needed to walk in hospital room?: Total Help needed climbing 3-5 steps with a railing? : Total 6 Click Score: 6    End of Session   Activity Tolerance: Patient tolerated treatment well;Patient limited by lethargy Patient left: in bed;with call bell/phone within reach;with bed alarm set Nurse Communication: Mobility status PT Visit Diagnosis: Other abnormalities of gait and mobility (R26.89);Muscle weakness (generalized) (M62.81)    Time: 7564-3329 PT Time Calculation (min) (ACUTE ONLY): 19 min   Charges:   PT Evaluation $PT Eval Moderate Complexity: 1 Mod          Tori Avyonna Wagoner PT, DPT 03/27/21, 1:44 PM

## 2021-03-27 NOTE — Progress Notes (Signed)
Patient transferred to room 1437.

## 2021-03-27 NOTE — Progress Notes (Signed)
Called daughter Gardiner Ramus with update on unit transfer.

## 2021-03-27 NOTE — Progress Notes (Signed)
PROGRESS NOTE    Kayla Thompson  HQI:[id number] DOB: [birth year] DOA: 03/26/2021 PCP: [name], MD    Brief Narrative:   Kayla Thompson is a 86 y.o. female with medical history significant of chronic diastolic CHF, hypertension, PAD, frequent UTIs, PMR on chronic steroids, asthma, insulin-dependent type 2 diabetes, hyperlipidemia, chronic constipation, hypothyroidism, depression, anxiety, fibromyalgia, osteoporosis, chronic pain, GERD, history of ovarian cancer status post TAH/BSO presented to the ED for evaluation of fever, URI symptoms, generalized weakness, somnolence, and poor oral intake.  In the ED, slightly tachycardic but not febrile or hypotensive.  Not hypoxic.  SARS-CoV-2 PCR test positive.  WBC 15.6.  Hemoglobin 10.2, stable.  Sodium 133, chronically low.  Creatinine 0.8, stable.  ALT 92, remainder of LFTs normal.  Lactic acid normal x2.  UA with negative nitrite, large amount of leukocytes, and 21-50 WBCs.  Urine culture pending.  Blood culture x2 drawn.  Chest x-ray negative for acute finding. Patient was given ceftriaxone, 1 L LR bolus, remdesivir, and Tylenol.     Assessment & Plan:   Principal Problem:   COVID-19 Active Problems:   Asthma, chronic   UTI (urinary tract infection)   Acute metabolic encephalopathy   Severe sepsis (HCC)   Pressure injury of skin   COVID-19 infection -Started on remdesivir -She is not hypoxic, no indication for steroids -Continue supportive measures  Possible UTI -Continue on ceftriaxone -Follow-up urine culture  Severe sepsis, present on admission -Admitted with leukocytosis, tachycardia, encephalopathy with source being UTI -Overall sepsis physiology appears to have improved  Acute metabolic encephalopathy -At baseline, appears that she may have some cognitive deficits that had began to develop in the last 6 months -She was increasingly lethargic on admission, appears to be improving now -Suspect encephalopathy  related to infectious process  Polymyalgia rheumatica -Continue home dose of prednisone  Asthma -No shortness of breath or wheezing -Continue current treatments  Insulin-dependent type 2 diabetes -Continue on Semglee and sliding scale insulin  Generalized weakness -Seen by PT/OT with recommendations for home health PT -Discussed with granddaughter, patient normally has 24/7 caregivers -Unfortunately, due to her recent COVID diagnosis, it is likely that hired caregivers, be able to care for her at home -Patient normally ambulates a few steps with a walker at home and would not be safe to be at home alone -We will need to discuss further resources with family  DVT prophylaxis: enoxaparin (LOVENOX) injection 30 mg Start: 03/27/21 1000  Code Status: DNR Family Communication: updated grand daughter over the phone Disposition Plan: Status is: Inpatient  Remains inpatient appropriate because: continued treatment of UTI and covid         Consultants:    Procedures:    Antimicrobials:  Ceftriaxone 1/5> Remdesivir 1/5>    Subjective: Sitting up in bed, pleasant, denies any shortness of breath, wants to order food for supper  Objective: Vitals:   03/27/21 0446 03/27/21 1146 03/27/21 2007 03/27/21 2229  BP: 136/76 128/72 134/90   Pulse: 86 97 100   Resp: 18 16 18    Temp: (!) 97.3 F (36.3 C) 99.4 F (37.4 C) 100.2 F (37.9 C) 99.2 F (37.3 C)  TempSrc: Oral Oral Oral Oral  SpO2: 99% 99% 98%   Weight:      Height:        Intake/Output Summary (Last 24 hours) at 03/27/2021 2318 Last data filed at 03/27/2021 2000 Gross per 24 hour  Intake 1853.97 ml  Output 1200 ml  Net 653.97 ml  Filed Weights   03/26/21 2029  Weight: 41.9 kg    Examination:  General exam: Appears calm and comfortable  Respiratory system: Clear to auscultation. Respiratory effort normal. Cardiovascular system: S1 & S2 heard, RRR. No JVD, murmurs, rubs, gallops or clicks. No pedal  edema. Gastrointestinal system: Abdomen is nondistended, soft and nontender. No organomegaly or masses felt. Normal bowel sounds heard. Central nervous system: Alert and oriented. No focal neurological deficits. Extremities: Symmetric 5 x 5 power. Skin: No rashes, lesions or ulcers Psychiatry: Judgement and insight appear normal. Mood & affect appropriate.     Data Reviewed: I have personally reviewed following labs and imaging studies  CBC: Recent Labs  Lab 03/26/21 1141 03/27/21 0048  WBC 15.6* 14.4*  NEUTROABS 11.1*  --   HGB 10.2* 9.1*  HCT 30.5* 26.8*  MCV 107.4* 109.8*  PLT 405* 017   Basic Metabolic Panel: Recent Labs  Lab 03/26/21 1141 03/27/21 0048  NA 133* 131*  K 3.7 3.3*  CL 98 100  CO2 24 22  GLUCOSE 164* 133*  BUN 19 14  CREATININE 0.80 0.65  CALCIUM 10.0 8.6*   GFR: Estimated Creatinine Clearance: 35.2 mL/min (by C-G formula based on SCr of 0.65 mg/dL). Liver Function Tests: Recent Labs  Lab 03/26/21 1141 03/27/21 0048  AST 29 23  ALT 92* 70*  ALKPHOS 66 53  BILITOT 0.5 0.5  PROT 7.1 6.2*  ALBUMIN 3.8 2.8*   No results for input(s): LIPASE, AMYLASE in the last 168 hours. Recent Labs  Lab 03/27/21 0338  AMMONIA 22   Coagulation Profile: No results for input(s): INR, PROTIME in the last 168 hours. Cardiac Enzymes: No results for input(s): CKTOTAL, CKMB, CKMBINDEX, TROPONINI in the last 168 hours. BNP (last 3 results) Recent Labs    11/26/20 1339  PROBNP 291   HbA1C: Recent Labs    03/27/21 0048  HGBA1C 6.5*   CBG: Recent Labs  Lab 03/27/21 0025 03/27/21 0802 03/27/21 1144 03/27/21 1712 03/27/21 2222  GLUCAP 136* 115* 126* 218* 172*   Lipid Profile: No results for input(s): CHOL, HDL, LDLCALC, TRIG, CHOLHDL, LDLDIRECT in the last 72 hours. Thyroid Function Tests: Recent Labs    03/27/21 0338  TSH 0.118*   Anemia Panel: Recent Labs    03/27/21 0048 03/27/21 0338  VITAMINB12  --  869  FERRITIN 105  --     Sepsis Labs: Recent Labs  Lab 03/26/21 1141 03/26/21 1605  LATICACIDVEN 1.1 1.0    Recent Results (from the past 240 hour(s))  Resp Panel by RT-PCR (Flu A&B, Covid) Nasopharyngeal Swab     Status: Abnormal   Collection Time: 03/26/21 11:36 AM   Specimen: Nasopharyngeal Swab; Nasopharyngeal(NP) swabs in vial transport medium  Result Value Ref Range Status   SARS Coronavirus 2 by RT PCR POSITIVE (A) NEGATIVE Final    Comment: (NOTE) SARS-CoV-2 target nucleic acids are DETECTED.  The SARS-CoV-2 RNA is generally detectable in upper respiratory specimens during the acute phase of infection. Positive results are indicative of the presence of the identified virus, but do not rule out bacterial infection or co-infection with other pathogens not detected by the test. Clinical correlation with patient history and other diagnostic information is necessary to determine patient infection status. The expected result is Negative.  Fact Sheet for Patients: EntrepreneurPulse.com.au  Fact Sheet for Healthcare Providers: IncredibleEmployment.be  This test is not yet approved or cleared by the [place] FDA and  has been authorized for detection and/or diagnosis of SARS-CoV-2 by  FDA under an Emergency Use Authorization (EUA).  This EUA will remain in effect (meaning this test can be used) for the duration of  the COVID-19 declaration under Section 564(b)(1) of the A ct, 21 U.S.C. section 360bbb-3(b)(1), unless the authorization is terminated or revoked sooner.     Influenza A by PCR NEGATIVE NEGATIVE Final   Influenza B by PCR NEGATIVE NEGATIVE Final    Comment: (NOTE) The Xpert Xpress SARS-CoV-2/FLU/RSV plus assay is intended as an aid in the diagnosis of influenza from Nasopharyngeal swab specimens and should not be used as a sole basis for treatment. Nasal washings and aspirates are unacceptable for Xpert Xpress  SARS-CoV-2/FLU/RSV testing.  Fact Sheet for Patients: EntrepreneurPulse.com.au  Fact Sheet for Healthcare Providers: IncredibleEmployment.be  This test is not yet approved or cleared by the [place] FDA and has been authorized for detection and/or diagnosis of SARS-CoV-2 by FDA under an Emergency Use Authorization (EUA). This EUA will remain in effect (meaning this test can be used) for the duration of the COVID-19 declaration under Section 564(b)(1) of the Act, 21 U.S.C. section 360bbb-3(b)(1), unless the authorization is terminated or revoked.  Performed at [facility], [address], [place], [place] [address]   Urine Culture     Status: Abnormal   Collection Time: 03/26/21  1:35 PM   Specimen: Urine, Random  Result Value Ref Range Status   Specimen Description   Final    URINE, RANDOM Performed at [facility], [address] [place], [place] [address]    Special Requests   Final    NONE Performed at [facility], [address] [place], [place] [address]    Culture (A)  Final    80,000 COLONIES/mL GROUP B STREP(S.AGALACTIAE)ISOLATED TESTING AGAINST S. AGALACTIAE NOT ROUTINELY PERFORMED DUE TO PREDICTABILITY OF AMP/PEN/VAN SUSCEPTIBILITY. Performed at [facility], [place] [address], [place], [place] [address]    Report Status 03/27/2021 FINAL  Final  Culture, blood (routine x 2)     Status: None (Preliminary result)   Collection Time: 03/26/21  3:06 PM   Specimen: BLOOD RIGHT FOREARM  Result Value Ref Range Status   Specimen Description   Final    BLOOD RIGHT FOREARM Performed at [facility], [address], [place], [place] [address]    Special Requests   Final    BOTTLES DRAWN AEROBIC AND ANAEROBIC Blood Culture results may not be optimal due to an excessive volume of blood received in culture bottles Performed at [facility], [address], [place], [place] [address]    Culture   Final    NO GROWTH < 24 HOURS Performed at [facility], [place] [address] [place], [place] [address]    Report Status PENDING  Incomplete  Culture, blood (routine x 2)     Status: None (Preliminary result)   Collection Time: 03/26/21  3:57 PM   Specimen: BLOOD  Result Value Ref Range Status   Specimen Description   Final    BLOOD RIGHT ANTECUBITAL Performed at [facility], [address], [place], [place] [address]    Special Requests   Final    BOTTLES DRAWN AEROBIC AND ANAEROBIC Blood Culture adequate volume Performed at [facility], [address] [place], [place] [address]    Culture   Final    NO GROWTH < 24 HOURS Performed at [facility], [place] [address], [place], [place] [address]    Report Status  PENDING  Incomplete         Radiology Studies: CT HEAD WO CONTRAST (5MM)  Result Date: 03/27/2021 CLINICAL DATA:  Delirium, general weakness, fever, confusion. EXAM: CT HEAD WITHOUT CONTRAST TECHNIQUE: Contiguous axial images were obtained from the base of the skull through the vertex without intravenous contrast. COMPARISON:  05/07/2020. FINDINGS: Brain: No acute intracranial hemorrhage, midline shift or mass effect. No extra-axial fluid collection. There is diffuse cortical atrophy. Subcortical and periventricular white matter hypodensities are present bilaterally. There is no hydrocephalus. Vascular: Atherosclerotic calcification of the carotid siphons. No hyperdense vessel. Skull: Normal. Negative for fracture or focal lesion. Sinuses/Orbits: Mucosal thickening is present in the ethmoid air cells and maxillary sinuses bilaterally. An air-fluid level seen in the left sphenoid sinus. The orbits are within normal limits. Other: There chronic sclerosis in the inferior aspect of the mastoid air cells on the right. IMPRESSION: 1. No acute intracranial process. 2.  Atrophy with chronic microvascular ischemic changes. Electronically Signed   By: Brett Fairy M.D.   On: 03/27/2021 03:01   DG Chest Port 1 View  Result Date: 03/26/2021 CLINICAL DATA:  Fevers EXAM: PORTABLE CHEST 1 VIEW COMPARISON:  Chest x-ray 11/26/2020 FINDINGS: Heart size is normal. Mediastinum appears stable. Calcified plaques in the aortic arch. Chronic prominent interstitial lung markings with no new focal consolidation identified. No pleural effusion or pneumothorax visualized. Severe degenerative changes of the shoulders noted. IMPRESSION: Chronic changes with no acute process identified. Electronically Signed   By: Ofilia Neas M.D.   On: 03/26/2021 12:29        Scheduled Meds:  docusate sodium  100 mg Oral Daily   DULoxetine  30 mg Oral q AM   DULoxetine  60 mg Oral q AM   enoxaparin (LOVENOX) injection  30 mg Subcutaneous Q24H   feeding supplement  237 mL Oral TID WC   fluticasone furoate-vilanterol  1 puff Inhalation Daily   And   umeclidinium bromide  1 puff Inhalation Daily   gabapentin  100 mg Oral QHS   insulin aspart  0-5 Units Subcutaneous QHS   insulin aspart  0-9 Units Subcutaneous TID WC   insulin glargine-yfgn  5 Units Subcutaneous Daily   levothyroxine  50 mcg Oral Daily   mirtazapine  15 mg Oral QHS   montelukast  10 mg Oral Daily   nystatin cream   Topical BID   pantoprazole  40 mg Oral Daily   polyethylene glycol  17 g Oral Daily   predniSONE  1 mg Oral BID WC   rosuvastatin  10 mg Oral QPM   Continuous Infusions:  cefTRIAXone (ROCEPHIN)  IV 1 g (03/27/21 1750)   lactated ringers with kcl 50 mL/hr at 03/27/21 1750   remdesivir 100 mg in NS 100 mL 100 mg (03/27/21 1050)     LOS: 0 days    Time spent: 35 mins    Kathie Dike, MD [facility]   If 7PM-7AM, please contact night-coverage [email or web address]  03/27/2021, 11:18 PM

## 2021-03-27 NOTE — TOC Progression Note (Signed)
Transition of Care [facility]) - Progression Note    Patient Details  Name: Kayla Thompson MRN: [id number] Date of Birth: [birth year]  Transition of Care [facility] CM/SW Contact  Purcell Mouton, RN Phone Number: 03/27/2021, 2:58 PM  Clinical Narrative:     Pt oriented to self. Will not be able to go to SNF until 04/07/21 related to [place].   Expected Discharge Plan: [place] Barriers to Discharge: No Barriers Identified  Expected Discharge Plan and Services Expected Discharge Plan: [place] arrangements for the past 2 months: Single Family Home                                       Social Determinants of Health (SDOH) Interventions    Readmission Risk Interventions Readmission Risk Prevention Plan 01/12/2021 08/04/2020 05/09/2020  Transportation Screening Complete Complete Complete  PCP or Specialist Appt within 5-7 Days - - Complete  PCP or Specialist Appt within 3-5 Days Complete - -  Home Care Screening - - Complete  HRI or Home Care Consult Complete - -  Palliative Care Screening Not Applicable - -  Medication Review ([place]) Complete - -  PCP or Specialist appointment within 3-5 days of discharge - Complete -  SW Recovery Care/Counseling Consult - Complete -  [place] - Not Applicable -  Some recent data might be hidden

## 2021-03-27 NOTE — Plan of Care (Signed)

## 2021-03-28 DIAGNOSIS — G9341 Metabolic encephalopathy: Secondary | ICD-10-CM | POA: Diagnosis not present

## 2021-03-28 DIAGNOSIS — N3000 Acute cystitis without hematuria: Secondary | ICD-10-CM | POA: Diagnosis not present

## 2021-03-28 DIAGNOSIS — U071 COVID-19: Secondary | ICD-10-CM | POA: Diagnosis not present

## 2021-03-28 DIAGNOSIS — J4530 Mild persistent asthma, uncomplicated: Secondary | ICD-10-CM | POA: Diagnosis not present

## 2021-03-28 LAB — GLUCOSE, CAPILLARY
Glucose-Capillary: 81 mg/dL (ref 70–99)
Glucose-Capillary: 197 mg/dL — ABNORMAL HIGH (ref 70–99)
Glucose-Capillary: 180 mg/dL — ABNORMAL HIGH (ref 70–99)
Glucose-Capillary: 143 mg/dL — ABNORMAL HIGH (ref 70–99)

## 2021-03-28 LAB — CBC
HCT: 27.1 % — ABNORMAL LOW (ref 36.0–46.0)
Hemoglobin: 9 g/dL — ABNORMAL LOW (ref 12.0–15.0)
MCH: 37.5 pg — ABNORMAL HIGH (ref 26.0–34.0)
MCHC: 33.2 g/dL (ref 30.0–36.0)
MCV: 112.9 fL — ABNORMAL HIGH (ref 80.0–100.0)
Platelets: 332 10*3/uL (ref 150–400)
RBC: 2.4 MIL/uL — ABNORMAL LOW (ref 3.87–5.11)
RDW: 14.1 % (ref 11.5–15.5)
WBC: 12.5 10*3/uL — ABNORMAL HIGH (ref 4.0–10.5)
nRBC: 0 % (ref 0.0–0.2)

## 2021-03-28 LAB — COMPREHENSIVE METABOLIC PANEL
ALT: 57 U/L — ABNORMAL HIGH (ref 0–44)
AST: 33 U/L (ref 15–41)
Albumin: 2.8 g/dL — ABNORMAL LOW (ref 3.5–5.0)
Alkaline Phosphatase: 46 U/L (ref 38–126)
Anion gap: 8 (ref 5–15)
BUN: 17 mg/dL (ref 8–23)
CO2: 24 mmol/L (ref 22–32)
Calcium: 8.8 mg/dL — ABNORMAL LOW (ref 8.9–10.3)
Chloride: 100 mmol/L (ref 98–111)
Creatinine, Ser: 0.65 mg/dL (ref 0.44–1.00)
GFR, Estimated: >60 mL/min (ref >60)
Glucose, Bld: 96 mg/dL (ref 70–99)
Potassium: 3.4 mmol/L — ABNORMAL LOW (ref 3.5–5.1)
Sodium: 132 mmol/L — ABNORMAL LOW (ref 135–145)
Total Bilirubin: 0.6 mg/dL (ref 0.3–1.2)
Total Protein: 6 g/dL — ABNORMAL LOW (ref 6.5–8.1)

## 2021-03-28 LAB — C-REACTIVE PROTEIN: CRP: 10.8 mg/dL — ABNORMAL HIGH (ref <1.0)

## 2021-03-28 NOTE — Progress Notes (Signed)
PHARMACY - PHYSICIAN COMMUNICATION CRITICAL VALUE ALERT - BLOOD CULTURE IDENTIFICATION (BCID)  Kayla Thompson is an 86 y.o. female who presented to [facility] on 03/26/2021 with Covid and UTI.  Assessment:  BCID + for Gram positive rods in 1 out of 4 bottles (1 anaerobic bottle).  As only in 1 bottle, very likely a contaminant.    Name of physician (or Provider) Contacted: X. Blount, FNP  Current antibiotics: Ceftriaxone for UTI  Changes to prescribed antibiotics recommended:  Patient is on recommended antibiotics - No changes needed  Results for orders placed or performed during the hospital encounter of 01/12/20  Blood Culture ID Panel (Reflexed) (Collected: 01/12/2020 11:29 AM)  Result Value Ref Range   Enterococcus faecalis NOT DETECTED NOT DETECTED   Enterococcus Faecium NOT DETECTED NOT DETECTED   Listeria monocytogenes NOT DETECTED NOT DETECTED   Staphylococcus species NOT DETECTED NOT DETECTED   Staphylococcus aureus (BCID) NOT DETECTED NOT DETECTED   Staphylococcus epidermidis NOT DETECTED NOT DETECTED   Staphylococcus lugdunensis NOT DETECTED NOT DETECTED   Streptococcus species NOT DETECTED NOT DETECTED   Streptococcus agalactiae NOT DETECTED NOT DETECTED   Streptococcus pneumoniae NOT DETECTED NOT DETECTED   Streptococcus pyogenes NOT DETECTED NOT DETECTED   A.calcoaceticus-baumannii NOT DETECTED NOT DETECTED   Bacteroides fragilis NOT DETECTED NOT DETECTED   Enterobacterales DETECTED (A) NOT DETECTED   Enterobacter cloacae complex NOT DETECTED NOT DETECTED   Escherichia coli DETECTED (A) NOT DETECTED   Klebsiella aerogenes NOT DETECTED NOT DETECTED   Klebsiella oxytoca NOT DETECTED NOT DETECTED   Klebsiella pneumoniae NOT DETECTED NOT DETECTED   Proteus species NOT DETECTED NOT DETECTED   Salmonella species NOT DETECTED NOT DETECTED   Serratia marcescens NOT DETECTED NOT DETECTED   Haemophilus influenzae NOT DETECTED NOT DETECTED   Neisseria meningitidis NOT  DETECTED NOT DETECTED   Pseudomonas aeruginosa NOT DETECTED NOT DETECTED   Stenotrophomonas maltophilia NOT DETECTED NOT DETECTED   Candida albicans NOT DETECTED NOT DETECTED   Candida auris NOT DETECTED NOT DETECTED   Candida glabrata NOT DETECTED NOT DETECTED   Candida krusei NOT DETECTED NOT DETECTED   Candida parapsilosis NOT DETECTED NOT DETECTED   Candida tropicalis NOT DETECTED NOT DETECTED   Cryptococcus neoformans/gattii NOT DETECTED NOT DETECTED   CTX-M ESBL DETECTED (A) NOT DETECTED   Carbapenem resistance IMP NOT DETECTED NOT DETECTED   Carbapenem resistance KPC NOT DETECTED NOT DETECTED   Carbapenem resistance NDM NOT DETECTED NOT DETECTED   Carbapenem resist OXA 48 LIKE NOT DETECTED NOT DETECTED   Carbapenem resistance VIM NOT DETECTED NOT DETECTED    Everette Rank, PharmD 03/28/2021  10:44 PM

## 2021-03-28 NOTE — Progress Notes (Signed)
PROGRESS NOTE    [name]  ERD:[id number] DOB: Jan 22, 1936 DOA: 03/26/2021 PCP: [name], MD    Brief Narrative:   Kayla Thompson is a 86 y.o. female with medical history significant of chronic diastolic CHF, hypertension, PAD, frequent UTIs, PMR on chronic steroids, asthma, insulin-dependent type 2 diabetes, hyperlipidemia, chronic constipation, hypothyroidism, depression, anxiety, fibromyalgia, osteoporosis, chronic pain, GERD, history of ovarian cancer status post TAH/BSO presented to the ED for evaluation of fever, URI symptoms, generalized weakness, somnolence, and poor oral intake.  In the ED, slightly tachycardic but not febrile or hypotensive.  Not hypoxic.  SARS-CoV-2 PCR test positive.  WBC 15.6.  Hemoglobin 10.2, stable.  Sodium 133, chronically low.  Creatinine 0.8, stable.  ALT 92, remainder of LFTs normal.  Lactic acid normal x2.  UA with negative nitrite, large amount of leukocytes, and 21-50 WBCs.  Urine culture pending.  Blood culture x2 drawn.  Chest x-ray negative for acute finding. Patient was given ceftriaxone, 1 L LR bolus, remdesivir, and Tylenol.     Assessment & Plan:   Principal Problem:   COVID-19 Active Problems:   Asthma, chronic   UTI (urinary tract infection)   Acute metabolic encephalopathy   Severe sepsis (HCC)   Pressure injury of skin   COVID-19 infection -Started on remdesivir -She is not hypoxic, no indication for steroids -Continue supportive measures -Continue to follow inflammatory markers  Possible UTI -Continue on ceftriaxone -Urine culture group B strep  Severe sepsis, present on admission -Admitted with leukocytosis, tachycardia, encephalopathy with source being UTI -Overall sepsis physiology appears to have improved  Acute metabolic encephalopathy -At baseline, appears that she may have some cognitive deficits that had began to develop in the last 6 months -She was increasingly lethargic on admission, appears to  be improving now -Suspect encephalopathy related to infectious process  Polymyalgia rheumatica -Continue home dose of prednisone  Asthma -No shortness of breath or wheezing -Continue current treatments  Insulin-dependent type 2 diabetes -Continue on Semglee and sliding scale insulin  Generalized weakness -Seen by PT/OT with recommendations for home health PT -Discussed with granddaughter, patient normally has 24/7 caregivers -Unfortunately, due to her recent COVID diagnosis, it is likely that hired caregivers, be able to care for her at home -Patient normally ambulates a few steps with a walker at home and would not be safe to be at home alone -Her daughter who lives in [place] to come down to [place] in order to care for the patient, but she would not be able to get down here until 1/16 -She is requesting to speak with TOC to help determine the best way to bridge the gap between discharge and her arrival to [facility]  DVT prophylaxis: enoxaparin (LOVENOX) injection 30 mg Start: 03/27/21 1000  Code Status: DNR Family Communication: updated daughter over the phone Disposition Plan: Status is: Inpatient  Remains inpatient appropriate because: continued treatment of UTI and covid         Consultants:    Procedures:    Antimicrobials:  Ceftriaxone 1/5> Remdesivir 1/5>    Subjective: She is up in the chair.  Became short of breath on ambulation.  She does not have any cough.  Objective: Vitals:   03/27/21 2229 03/28/21 0612 03/28/21 1122 03/28/21 1957  BP:  130/62 (!) 144/69 117/79  Pulse:  84 89 100  Resp:  16 17 18   Temp: 99.2 F (37.3 C) 98.2 F (36.8 C) 98.4 F (36.9 C) 99.2 F (37.3 C)  TempSrc: Oral Oral Oral Oral  SpO2:  100% 98% 99%  Weight:      Height:        Intake/Output Summary (Last 24 hours) at 03/28/2021 2024 Last data filed at 03/28/2021 0700 Gross per 24 hour  Intake 550.08 ml  Output --  Net 550.08 ml   Filed Weights    03/26/21 2029  Weight: 41.9 kg    Examination:  General exam: Appears calm and comfortable  Respiratory system: Clear to auscultation. Respiratory effort normal. Cardiovascular system: S1 & S2 heard, RRR. No JVD, murmurs, rubs, gallops or clicks. No pedal edema. Gastrointestinal system: Abdomen is nondistended, soft and nontender. No organomegaly or masses felt. Normal bowel sounds heard. Central nervous system: Alert and oriented. No focal neurological deficits. Extremities: Symmetric 5 x 5 power. Skin: No rashes, lesions or ulcers Psychiatry: Judgement and insight appear normal. Mood & affect appropriate.     Data Reviewed: I have personally reviewed following labs and imaging studies  CBC: Recent Labs  Lab 03/26/21 1141 03/27/21 0048 03/28/21 0416  WBC 15.6* 14.4* 12.5*  NEUTROABS 11.1*  --   --   HGB 10.2* 9.1* 9.0*  HCT 30.5* 26.8* 27.1*  MCV 107.4* 109.8* 112.9*  PLT 405* 332 373   Basic Metabolic Panel: Recent Labs  Lab 03/26/21 1141 03/27/21 0048 03/28/21 0416  NA 133* 131* 132*  K 3.7 3.3* 3.4*  CL 98 100 100  CO2 24 22 24   GLUCOSE 164* 133* 96  BUN 19 14 17   CREATININE 0.80 0.65 0.65  CALCIUM 10.0 8.6* 8.8*   GFR: Estimated Creatinine Clearance: 35.2 mL/min (by C-G formula based on SCr of 0.65 mg/dL). Liver Function Tests: Recent Labs  Lab 03/26/21 1141 03/27/21 0048 03/28/21 0416  AST 29 23 33  ALT 92* 70* 57*  ALKPHOS 66 53 46  BILITOT 0.5 0.5 0.6  PROT 7.1 6.2* 6.0*  ALBUMIN 3.8 2.8* 2.8*   No results for input(s): LIPASE, AMYLASE in the last 168 hours. Recent Labs  Lab 03/27/21 0338  AMMONIA 22   Coagulation Profile: No results for input(s): INR, PROTIME in the last 168 hours. Cardiac Enzymes: No results for input(s): CKTOTAL, CKMB, CKMBINDEX, TROPONINI in the last 168 hours. BNP (last 3 results) Recent Labs    11/26/20 1339  PROBNP 291   HbA1C: Recent Labs    03/27/21 0048  HGBA1C 6.5*   CBG: Recent Labs  Lab  03/27/21 2222 03/28/21 0745 03/28/21 1123 03/28/21 1714 03/28/21 1952  GLUCAP 172* 81 197* 180* 143*   Lipid Profile: No results for input(s): CHOL, HDL, LDLCALC, TRIG, CHOLHDL, LDLDIRECT in the last 72 hours. Thyroid Function Tests: Recent Labs    03/27/21 0338  TSH 0.118*   Anemia Panel: Recent Labs    03/27/21 0048 03/27/21 0338  VITAMINB12  --  869  FERRITIN 105  --    Sepsis Labs: Recent Labs  Lab 03/26/21 1141 03/26/21 1605  LATICACIDVEN 1.1 1.0    Recent Results (from the past 240 hour(s))  Resp Panel by RT-PCR (Flu A&B, Covid) Nasopharyngeal Swab     Status: Abnormal   Collection Time: 03/26/21 11:36 AM   Specimen: Nasopharyngeal Swab; Nasopharyngeal(NP) swabs in vial transport medium  Result Value Ref Range Status   SARS Coronavirus 2 by RT PCR POSITIVE (A) NEGATIVE Final    Comment: (NOTE) SARS-CoV-2 target nucleic acids are DETECTED.  The SARS-CoV-2 RNA is generally detectable in upper respiratory specimens during the acute phase of infection. Positive results are  indicative of the presence of the identified virus, but do not rule out bacterial infection or co-infection with other pathogens not detected by the test. Clinical correlation with patient history and other diagnostic information is necessary to determine patient infection status. The expected result is Negative.  Fact Sheet for Patients: EntrepreneurPulse.com.au  Fact Sheet for Healthcare Providers: IncredibleEmployment.be  This test is not yet approved or cleared by the [place] FDA and  has been authorized for detection and/or diagnosis of SARS-CoV-2 by FDA under an Emergency Use Authorization (EUA).  This EUA will remain in effect (meaning this test can be used) for the duration of  the COVID-19 declaration under Section 564(b)(1) of the A ct, 21 U.S.C. section 360bbb-3(b)(1), unless the authorization is terminated or revoked sooner.      Influenza A by PCR NEGATIVE NEGATIVE Final   Influenza B by PCR NEGATIVE NEGATIVE Final    Comment: (NOTE) The Xpert Xpress SARS-CoV-2/FLU/RSV plus assay is intended as an aid in the diagnosis of influenza from Nasopharyngeal swab specimens and should not be used as a sole basis for treatment. Nasal washings and aspirates are unacceptable for Xpert Xpress SARS-CoV-2/FLU/RSV testing.  Fact Sheet for Patients: EntrepreneurPulse.com.au  Fact Sheet for Healthcare Providers: IncredibleEmployment.be  This test is not yet approved or cleared by the [place] FDA and has been authorized for detection and/or diagnosis of SARS-CoV-2 by FDA under an Emergency Use Authorization (EUA). This EUA will remain in effect (meaning this test can be used) for the duration of the COVID-19 declaration under Section 564(b)(1) of the Act, 21 U.S.C. section 360bbb-3(b)(1), unless the authorization is terminated or revoked.  Performed at [facility], [address] [place], [place] [address]   Urine Culture     Status: Abnormal   Collection Time: 03/26/21  1:35 PM   Specimen: Urine, Random  Result Value Ref Range Status   Specimen Description   Final    URINE, RANDOM Performed at [facility], [address] [place], [place] [address]    Special Requests   Final    NONE Performed at [facility], [address] [place], [place] [address]    Culture (A)  Final    80,000 COLONIES/mL GROUP B STREP(S.AGALACTIAE)ISOLATED TESTING AGAINST S. AGALACTIAE NOT ROUTINELY PERFORMED DUE TO PREDICTABILITY OF AMP/PEN/VAN SUSCEPTIBILITY. Performed at [facility], [place] [address] [place], [place] [address]    Report Status 03/27/2021 FINAL  Final  Culture, blood (routine x 2)     Status: None (Preliminary result)   Collection Time: 03/26/21  3:06 PM   Specimen: BLOOD RIGHT FOREARM  Result Value Ref Range  Status   Specimen Description   Final    BLOOD RIGHT FOREARM Performed at [facility], [address] [place], [place] [address]    Special Requests   Final    BOTTLES DRAWN AEROBIC AND ANAEROBIC Blood Culture results may not be optimal due to an excessive volume of blood received in culture bottles Performed at [facility], [address] [place], [place] [address]    Culture   Final    NO GROWTH 2 DAYS Performed at [facility], [place] [address], [place], [place] [address]    Report Status PENDING  Incomplete  Culture, blood (routine x 2)     Status: None (Preliminary result)   Collection Time: 03/26/21  3:57 PM   Specimen: BLOOD  Result Value Ref Range Status   Specimen Description  Final    BLOOD RIGHT ANTECUBITAL Performed at [facility], [address] [place], [place] [address]    Special Requests   Final    BOTTLES DRAWN AEROBIC AND ANAEROBIC Blood Culture adequate volume Performed at [facility], [address] [place], [place] [address]    Culture   Final    NO GROWTH 2 DAYS Performed at [facility], [place] [address] [place],  [address]    Report Status PENDING  Incomplete         Radiology Studies: CT HEAD WO CONTRAST (5MM)  Result Date: 03/27/2021 CLINICAL DATA:  Delirium, general weakness, fever, confusion. EXAM: CT HEAD WITHOUT CONTRAST TECHNIQUE: Contiguous axial images were obtained from the base of the skull through the vertex without intravenous contrast. COMPARISON:  05/07/2020. FINDINGS: Brain: No acute intracranial hemorrhage, midline shift or mass effect. No extra-axial fluid collection. There is diffuse cortical atrophy. Subcortical and periventricular white matter hypodensities are present bilaterally. There is no hydrocephalus. Vascular: Atherosclerotic calcification of the carotid siphons. No hyperdense vessel. Skull: Normal. Negative for fracture  or focal lesion. Sinuses/Orbits: Mucosal thickening is present in the ethmoid air cells and maxillary sinuses bilaterally. An air-fluid level seen in the left sphenoid sinus. The orbits are within normal limits. Other: There chronic sclerosis in the inferior aspect of the mastoid air cells on the right. IMPRESSION: 1. No acute intracranial process. 2. Atrophy with chronic microvascular ischemic changes. Electronically Signed   By: Brett Fairy M.D.   On: 03/27/2021 03:01        Scheduled Meds:  docusate sodium  100 mg Oral Daily   DULoxetine  30 mg Oral q AM   DULoxetine  60 mg Oral q AM   enoxaparin (LOVENOX) injection  30 mg Subcutaneous Q24H   feeding supplement  237 mL Oral TID WC   fluticasone furoate-vilanterol  1 puff Inhalation Daily   And   umeclidinium bromide  1 puff Inhalation Daily   gabapentin  100 mg Oral QHS   insulin aspart  0-5 Units Subcutaneous QHS   insulin aspart  0-9 Units Subcutaneous TID WC   insulin glargine-yfgn  5 Units Subcutaneous Daily   levothyroxine  50 mcg Oral Daily   mirtazapine  15 mg Oral QHS   montelukast  10 mg Oral Daily   nystatin cream   Topical BID   pantoprazole  40 mg Oral Daily   polyethylene glycol  17 g Oral Daily   predniSONE  1 mg Oral BID WC   rosuvastatin  10 mg Oral QPM   Continuous Infusions:  cefTRIAXone (ROCEPHIN)  IV 1 g (03/28/21 1721)   lactated ringers with kcl 50 mL/hr at 03/27/21 1750     LOS: 1 day    Time spent: 35 mins    Kathie Dike, MD [facility]   If 7PM-7AM, please contact night-coverage [email or web address]  03/28/2021, 8:24 PM

## 2021-03-28 NOTE — Evaluation (Addendum)
Occupational Therapy Evaluation Patient Details Name: Kayla Thompson MRN: [id number] DOB: 03-18-1936 Today's Date: 03/28/2021   History of Present Illness Kayla Thompson is a 86 y.o. female admitted with covid19. PMH: CHF, HTN, PAD, frequent UTIs, asthma, diabetes, hyperlipidemia, hypothyroidism, depression, anxiety, fibromyalgia, osteoporosis, chronic pain, GERD, history of ovarian cancer s/p TAH/BSO   Clinical Impression   Patient is a 86 year old female who was noted to have had a decline in the ability to participate in ADLs at [facility]. Patient was living at home with caregivers support prior level but able to participate more than current levels. Patient is currently mod A +2 for transfers with increased time and safety cues to participate in tasks with RW. Patient was noted to have decreased activity tolerance, decreased endurance, decreased standing balance/tolerance, decreased strength and impaired ROM of BUE impacting participation in ADLs. Patient would continue to benefit from skilled OT services at this time while admitted and after d/c to address noted deficits in order to improve overall safety and independence in ADLs. Patients HR was noted to reach 125 bpm after transfer. Nurse made aware.        Recommendations for follow up therapy are one component of a multi-disciplinary discharge planning process, led by the attending physician.  Recommendations may be updated based on patient status, additional functional criteria and insurance authorization.   Follow Up Recommendations  Home health OT (pending level of support at home. SNF if family unable to provide assist)    Assistance Recommended at Discharge    Patient can return home with the following Two people to help with walking and/or transfers;Two people to help with bathing/dressing/bathroom;Direct supervision/assist for medications management;Help with stairs or ramp for entrance;Assist for transportation;Direct  supervision/assist for financial management    Functional Status Assessment  Patient has had a recent decline in their functional status and demonstrates the ability to make significant improvements in function in a reasonable and predictable amount of time.  Equipment Recommendations  Other (comment) (TBD)    Recommendations for Other Services       Precautions / Restrictions Precautions Precautions: Fall Restrictions Weight Bearing Restrictions: No      Mobility Bed Mobility Overal bed mobility: Needs Assistance Bed Mobility: Supine to Sit;Sit to Supine     Supine to sit: Max assist;HOB elevated     General bed mobility comments: with increased time and noted siffness in BLE with movement. initally patient unable to bend BLE until a few minutes sitting on EOB to relax knees    Transfers                          Balance Overall balance assessment: Needs assistance Sitting-balance support: Feet supported Sitting balance-Leahy Scale: Fair     Standing balance support: Reliant on assistive device for balance;Bilateral upper extremity supported Standing balance-Leahy Scale: Poor                             ADL either performed or assessed with clinical judgement   ADL Overall ADL's : Needs assistance/impaired Eating/Feeding: Minimal assistance;Sitting Eating/Feeding Details (indicate cue type and reason): in recliner Grooming: Wash/dry face;Sitting;Minimal assistance   Upper Body Bathing: Maximal assistance;Sitting Upper Body Bathing Details (indicate cue type and reason): at baseline Lower Body Bathing: Maximal assistance;Bed level Lower Body Bathing Details (indicate cue type and reason): at baseline Upper Body Dressing : Bed level;Maximal assistance Upper Body Dressing  Details (indicate cue type and reason): at baseline Lower Body Dressing: Maximal assistance;Bed level Lower Body Dressing Details (indicate cue type and reason): at  baseline Toilet Transfer: Moderate assistance;+2 for safety/equipment;+2 for physical assistance Toilet Transfer Details (indicate cue type and reason): with RW to recliner in room to simulate transfer Reserve and Hygiene: Maximal assistance;Sit to/from stand       Functional mobility during ADLs: +2 for physical assistance;+2 for safety/equipment;Rolling walker (2 wheels)       Vision Baseline Vision/History: 1 Wears glasses Ability to See in Adequate Light: 1 Impaired Patient Visual Report: No change from baseline       Perception     Praxis      Pertinent Vitals/Pain Pain Assessment: Faces Faces Pain Scale: Hurts a little bit Pain Location: "arms" with bed mobility Pain Descriptors / Indicators: Grimacing;Guarding Pain Intervention(s): Limited activity within patient's tolerance;Monitored during session;Repositioned     Hand Dominance Right   Extremity/Trunk Assessment Upper Extremity Assessment Upper Extremity Assessment: RUE deficits/detail;LUE deficits/detail RUE Deficits / Details: patient has no active ROM of shoulder. patient declined PROM with reports of pain with movement. daughter reported history of rotator cuff tears bilaterally. LUE Deficits / Details: patient has no active ROM of shoulder. patient declined PROM with reports of pain with movement. daughter reported history of rotator cuff tears bilaterally. noted to have audible crepitus with all movement of all joints and trunk   Lower Extremity Assessment Lower Extremity Assessment: Defer to PT evaluation   Cervical / Trunk Assessment Cervical / Trunk Assessment: Kyphotic   Communication Communication Communication: Other (comment);HOH   Cognition Arousal/Alertness: Awake/alert Behavior During Therapy: Flat affect Overall Cognitive Status: History of cognitive impairments - at baseline                                 General Comments: patients daughter was  present for session. patient was able to follow one step commands with increased time and redirection     General Comments       Exercises     Shoulder Instructions      Home Living Family/patient expects to be discharged to:: Private residence Living Arrangements: Alone Available Help at Discharge: Family;Personal care attendant;Available 24 hours/day Type of Home: House Home Access: Stairs to enter [employer] of Steps: 1 full step to a patio, then threshold Entrance Stairs-Rails: None Home Layout: One level     Bathroom Shower/Tub: Occupational psychologist: Standard Bathroom Accessibility: Yes How Accessible: Accessible via walker Home Equipment: Rolling Walker (2 wheels);Rollator (4 wheels);Wheelchair - manual;Grab bars - tub/shower;Hand held shower head;Shower seat   Additional Comments: patients daughter was present to confirm info in chart today      Prior Functioning/Environment Prior Level of Function : Needs assist  Cognitive Assist : Mobility (cognitive);ADLs (cognitive) Mobility (Cognitive): Intermittent cues ADLs (Cognitive): Intermittent cues Physical Assist : ADLs (physical)   ADLs (physical): Bathing;Dressing;IADLs Mobility Comments: patient was able to use rollator in house for functional mobility short distances ADLs Comments: patient was able to complete toileting tasks with caregivers present. patient does not complete bathing or dressing tasks herself. bilateral shoulder ROM issues impact participation in all tasks. albe to self feed.        OT Problem List: Decreased strength;Decreased activity tolerance;Impaired balance (sitting and/or standing);Decreased safety awareness;Decreased cognition;Cardiopulmonary status limiting activity;Impaired UE functional use;Decreased knowledge of precautions;Decreased knowledge of use of DME or AE  OT Treatment/Interventions: Self-care/ADL training;Neuromuscular education;Energy  conservation;DME and/or AE instruction;Therapeutic activities;Balance training;Patient/family education    OT Goals(Current goals can be found in the care plan section) Acute Rehab OT Goals Patient Stated Goal: to get out of bed OT Goal Formulation: With patient/family Time For Goal Achievement: 04/11/21 Potential to Achieve Goals: Fair  OT Frequency: Min 2X/week    Co-evaluation              AM-PAC OT "6 Clicks" Daily Activity     Outcome Measure Help from another person eating meals?: A Little Help from another person taking care of personal grooming?: A Little Help from another person toileting, which includes using toliet, bedpan, or urinal?: A Lot Help from another person bathing (including washing, rinsing, drying)?: A Lot Help from another person to put on and taking off regular upper body clothing?: A Lot Help from another person to put on and taking off regular lower body clothing?: A Lot 6 Click Score: 14   End of Session Equipment Utilized During Treatment: Gait belt;Rolling walker (2 wheels) Nurse Communication: Mobility status  Activity Tolerance: Patient tolerated treatment well Patient left: in chair;with call bell/phone within reach;with chair alarm set;with family/visitor present;with nursing/sitter in room  OT Visit Diagnosis: Unsteadiness on feet (R26.81);Other abnormalities of gait and mobility (R26.89);Muscle weakness (generalized) (M62.81);History of falling (Z91.81)                Time: 7282-0601 OT Time Calculation (min): 22 min Charges:  OT General Charges $OT Visit: 1 Visit OT Evaluation $OT Eval Low Complexity: 1 Low  Leota Sauers, MS Acute Rehabilitation Department Office# [phone] Pager# [phone]   Marcellina Millin 03/28/2021, 5:11 PM

## 2021-03-29 DIAGNOSIS — N3000 Acute cystitis without hematuria: Secondary | ICD-10-CM | POA: Diagnosis not present

## 2021-03-29 DIAGNOSIS — U071 COVID-19: Secondary | ICD-10-CM | POA: Diagnosis not present

## 2021-03-29 DIAGNOSIS — G9341 Metabolic encephalopathy: Secondary | ICD-10-CM | POA: Diagnosis not present

## 2021-03-29 DIAGNOSIS — J4530 Mild persistent asthma, uncomplicated: Secondary | ICD-10-CM | POA: Diagnosis not present

## 2021-03-29 LAB — CBC
HCT: 28.6 % — ABNORMAL LOW (ref 36.0–46.0)
Hemoglobin: 9.4 g/dL — ABNORMAL LOW (ref 12.0–15.0)
MCH: 37.3 pg — ABNORMAL HIGH (ref 26.0–34.0)
MCHC: 32.9 g/dL (ref 30.0–36.0)
MCV: 113.5 fL — ABNORMAL HIGH (ref 80.0–100.0)
Platelets: 352 10*3/uL (ref 150–400)
RBC: 2.52 MIL/uL — ABNORMAL LOW (ref 3.87–5.11)
RDW: 14.2 % (ref 11.5–15.5)
WBC: 11.3 10*3/uL — ABNORMAL HIGH (ref 4.0–10.5)
nRBC: 0 % (ref 0.0–0.2)

## 2021-03-29 LAB — BASIC METABOLIC PANEL
Anion gap: 10 (ref 5–15)
BUN: 19 mg/dL (ref 8–23)
CO2: 24 mmol/L (ref 22–32)
Calcium: 9 mg/dL (ref 8.9–10.3)
Chloride: 100 mmol/L (ref 98–111)
Creatinine, Ser: 0.89 mg/dL (ref 0.44–1.00)
GFR, Estimated: >60 mL/min (ref >60)
Glucose, Bld: 158 mg/dL — ABNORMAL HIGH (ref 70–99)
Potassium: 3.5 mmol/L (ref 3.5–5.1)
Sodium: 134 mmol/L — ABNORMAL LOW (ref 135–145)

## 2021-03-29 LAB — C-REACTIVE PROTEIN: CRP: 8 mg/dL — ABNORMAL HIGH (ref <1.0)

## 2021-03-29 LAB — GLUCOSE, CAPILLARY
Glucose-Capillary: 204 mg/dL — ABNORMAL HIGH (ref 70–99)
Glucose-Capillary: 170 mg/dL — ABNORMAL HIGH (ref 70–99)
Glucose-Capillary: 118 mg/dL — ABNORMAL HIGH (ref 70–99)
Glucose-Capillary: 120 mg/dL — ABNORMAL HIGH (ref 70–99)

## 2021-03-29 NOTE — Progress Notes (Signed)
PROGRESS NOTE    Kayla Thompson  SJG:[id number] DOB: 11/11/1935 DOA: 03/26/2021 PCP: [name], MD    Brief Narrative:   Kayla Thompson is a 86 y.o. female with medical history significant of chronic diastolic CHF, hypertension, PAD, frequent UTIs, PMR on chronic steroids, asthma, insulin-dependent type 2 diabetes, hyperlipidemia, chronic constipation, hypothyroidism, depression, anxiety, fibromyalgia, osteoporosis, chronic pain, GERD, history of ovarian cancer status post TAH/BSO presented to the ED for evaluation of fever, URI symptoms, generalized weakness, somnolence, and poor oral intake.  In the ED, slightly tachycardic but not febrile or hypotensive.  Not hypoxic.  SARS-CoV-2 PCR test positive.  WBC 15.6.  Hemoglobin 10.2, stable.  Sodium 133, chronically low.  Creatinine 0.8, stable.  ALT 92, remainder of LFTs normal.  Lactic acid normal x2.  UA with negative nitrite, large amount of leukocytes, and 21-50 WBCs.  Urine culture pending.  Blood culture x2 drawn.  Chest x-ray negative for acute finding. Patient was given ceftriaxone, 1 L LR bolus, remdesivir, and Tylenol.     Assessment & Plan:   Principal Problem:   COVID-19 Active Problems:   Asthma, chronic   UTI (urinary tract infection)   Acute metabolic encephalopathy   Severe sepsis (HCC)   Pressure injury of skin   COVID-19 infection -Started on remdesivir -She is not hypoxic, no indication for steroids -Continue supportive measures -Overall inflammatory markers trending down -Remdesivir should be completed after tomorrow's dose  Possible UTI -Continue on ceftriaxone -Urine culture group B strep  Severe sepsis, present on admission -Admitted with leukocytosis, tachycardia, encephalopathy with source being UTI -Overall sepsis physiology appears to have improved  Acute metabolic encephalopathy -At baseline, appears that she may have some cognitive deficits that had began to develop in the last 6  months -She was increasingly lethargic on admission, appears to be improving now -Suspect encephalopathy related to infectious process -Mental status appears to have improved back to baseline  Polymyalgia rheumatica -Continue home dose of prednisone  Asthma -No shortness of breath or wheezing -Continue current treatments  Insulin-dependent type 2 diabetes -Continue on Semglee and sliding scale insulin  Generalized weakness -Seen by PT/OT with recommendations for home health PT -Discussed with granddaughter, patient normally has 24/7 caregivers -Unfortunately, due to her recent COVID diagnosis, it is likely that hired caregivers, be able to care for her at home -Patient normally ambulates a few steps with a walker at home and would not be safe to be at home alone -Her daughter who lives in [place] to come down to [place] in order to care for the patient, but she would not be able to get down here until 1/16 -She is requesting to speak with TOC to help determine the best way to bridge the gap between discharge and her arrival to [facility]  DVT prophylaxis: enoxaparin (LOVENOX) injection 30 mg Start: 03/27/21 1000  Code Status: DNR Family Communication: updated daughter over the phone 1/7 Disposition Plan: Status is: Inpatient  Remains inpatient appropriate because: continued treatment of UTI and covid         Consultants:    Procedures:    Antimicrobials:  Ceftriaxone 1/5> Remdesivir 1/5>    Subjective: She is sitting up in bed.  Reports that she still becomes short of breath on exertion.  Overall, she denies any shortness of breath at rest.  Objective: Vitals:   03/28/21 1957 03/29/21 0651 03/29/21 1136 03/29/21 1957  BP: 117/79 (!) 149/81 123/79 127/78  Pulse: 100 96 (!) 108 94  Resp: 18 16 17 18   Temp: 99.2 F (37.3 C) 98.4 F (36.9 C)  98.5 F (36.9 C)  TempSrc: Oral Oral  Oral  SpO2: 99% 99% 99% 100%  Weight:      Height:         Intake/Output Summary (Last 24 hours) at 03/29/2021 2130 Last data filed at 03/29/2021 1054 Gross per 24 hour  Intake 600 ml  Output 1350 ml  Net -750 ml   Filed Weights   03/26/21 2029  Weight: 41.9 kg    Examination:  General exam: Appears calm and comfortable  Respiratory system: Coarse breath sounds bilaterally. Respiratory effort normal. Cardiovascular system: S1 & S2 heard, RRR. No JVD, murmurs, rubs, gallops or clicks. No pedal edema. Gastrointestinal system: Abdomen is nondistended, soft and nontender. No organomegaly or masses felt. Normal bowel sounds heard. Central nervous system: Alert and oriented. No focal neurological deficits. Extremities: Symmetric 5 x 5 power. Skin: No rashes, lesions or ulcers Psychiatry: Judgement and insight appear normal. Mood & affect appropriate.     Data Reviewed: I have personally reviewed following labs and imaging studies  CBC: Recent Labs  Lab 03/26/21 1141 03/27/21 0048 03/28/21 0416 03/29/21 0351  WBC 15.6* 14.4* 12.5* 11.3*  NEUTROABS 11.1*  --   --   --   HGB 10.2* 9.1* 9.0* 9.4*  HCT 30.5* 26.8* 27.1* 28.6*  MCV 107.4* 109.8* 112.9* 113.5*  PLT 405* 332 332 568   Basic Metabolic Panel: Recent Labs  Lab 03/26/21 1141 03/27/21 0048 03/28/21 0416 03/29/21 0351  NA 133* 131* 132* 134*  K 3.7 3.3* 3.4* 3.5  CL 98 100 100 100  CO2 24 22 24 24   GLUCOSE 164* 133* 96 158*  BUN 19 14 17 19   CREATININE 0.80 0.65 0.65 0.89  CALCIUM 10.0 8.6* 8.8* 9.0   GFR: Estimated Creatinine Clearance: 31.7 mL/min (by C-G formula based on SCr of 0.89 mg/dL). Liver Function Tests: Recent Labs  Lab 03/26/21 1141 03/27/21 0048 03/28/21 0416  AST 29 23 33  ALT 92* 70* 57*  ALKPHOS 66 53 46  BILITOT 0.5 0.5 0.6  PROT 7.1 6.2* 6.0*  ALBUMIN 3.8 2.8* 2.8*   No results for input(s): LIPASE, AMYLASE in the last 168 hours. Recent Labs  Lab 03/27/21 0338  AMMONIA 22   Coagulation Profile: No results for input(s): INR,  PROTIME in the last 168 hours. Cardiac Enzymes: No results for input(s): CKTOTAL, CKMB, CKMBINDEX, TROPONINI in the last 168 hours. BNP (last 3 results) Recent Labs    11/26/20 1339  PROBNP 291   HbA1C: Recent Labs    03/27/21 0048  HGBA1C 6.5*   CBG: Recent Labs  Lab 03/28/21 1952 03/29/21 0742 03/29/21 1131 03/29/21 1619 03/29/21 1953  GLUCAP 143* 204* 170* 118* 120*   Lipid Profile: No results for input(s): CHOL, HDL, LDLCALC, TRIG, CHOLHDL, LDLDIRECT in the last 72 hours. Thyroid Function Tests: Recent Labs    03/27/21 0338  TSH 0.118*   Anemia Panel: Recent Labs    03/27/21 0048 03/27/21 0338  VITAMINB12  --  869  FERRITIN 105  --    Sepsis Labs: Recent Labs  Lab 03/26/21 1141 03/26/21 1605  LATICACIDVEN 1.1 1.0    Recent Results (from the past 240 hour(s))  Resp Panel by RT-PCR (Flu A&B, Covid) Nasopharyngeal Swab     Status: Abnormal   Collection Time: 03/26/21 11:36 AM   Specimen: Nasopharyngeal Swab; Nasopharyngeal(NP) swabs in vial transport medium  Result Value Ref Range Status  SARS Coronavirus 2 by RT PCR POSITIVE (A) NEGATIVE Final    Comment: (NOTE) SARS-CoV-2 target nucleic acids are DETECTED.  The SARS-CoV-2 RNA is generally detectable in upper respiratory specimens during the acute phase of infection. Positive results are indicative of the presence of the identified virus, but do not rule out bacterial infection or co-infection with other pathogens not detected by the test. Clinical correlation with patient history and other diagnostic information is necessary to determine patient infection status. The expected result is Negative.  Fact Sheet for Patients: EntrepreneurPulse.com.au  Fact Sheet for Healthcare Providers: IncredibleEmployment.be  This test is not yet approved or cleared by the [place] FDA and  has been authorized for detection and/or diagnosis of SARS-CoV-2 by FDA under  an Emergency Use Authorization (EUA).  This EUA will remain in effect (meaning this test can be used) for the duration of  the COVID-19 declaration under Section 564(b)(1) of the A ct, 21 U.S.C. section 360bbb-3(b)(1), unless the authorization is terminated or revoked sooner.     Influenza A by PCR NEGATIVE NEGATIVE Final   Influenza B by PCR NEGATIVE NEGATIVE Final    Comment: (NOTE) The Xpert Xpress SARS-CoV-2/FLU/RSV plus assay is intended as an aid in the diagnosis of influenza from Nasopharyngeal swab specimens and should not be used as a sole basis for treatment. Nasal washings and aspirates are unacceptable for Xpert Xpress SARS-CoV-2/FLU/RSV testing.  Fact Sheet for Patients: EntrepreneurPulse.com.au  Fact Sheet for Healthcare Providers: IncredibleEmployment.be  This test is not yet approved or cleared by the [place] FDA and has been authorized for detection and/or diagnosis of SARS-CoV-2 by FDA under an Emergency Use Authorization (EUA). This EUA will remain in effect (meaning this test can be used) for the duration of the COVID-19 declaration under Section 564(b)(1) of the Act, 21 U.S.C. section 360bbb-3(b)(1), unless the authorization is terminated or revoked.  Performed at [facility], [address], [place], [place] [address]   Urine Culture     Status: Abnormal   Collection Time: 03/26/21  1:35 PM   Specimen: Urine, Random  Result Value Ref Range Status   Specimen Description   Final    URINE, RANDOM Performed at [facility], [address], [place], [place] [address]    Special Requests   Final    NONE Performed at [facility], [address] [place], [place] [address]    Culture (A)  Final    80,000 COLONIES/mL GROUP B STREP(S.AGALACTIAE)ISOLATED TESTING AGAINST S. AGALACTIAE NOT ROUTINELY PERFORMED DUE TO PREDICTABILITY OF AMP/PEN/VAN  SUSCEPTIBILITY. Performed at [facility], [place] [address], [place], [place] [address]    Report Status 03/27/2021 FINAL  Final  Culture, blood (routine x 2)     Status: None (Preliminary result)   Collection Time: 03/26/21  3:06 PM   Specimen: BLOOD RIGHT FOREARM  Result Value Ref Range Status   Specimen Description   Final    BLOOD RIGHT FOREARM Performed at [facility], [address] [place], [place] [address]    Special Requests   Final    BOTTLES DRAWN AEROBIC AND ANAEROBIC Blood Culture results may not be optimal due to an excessive volume of blood received in culture bottles Performed at [facility], [address], [place], [place] [address]    Culture   Final    NO GROWTH 3 DAYS Performed at [facility], [place] [address], [place], [place] [address]    Report Status  PENDING  Incomplete  Culture, blood (routine x 2)     Status: None (Preliminary result)   Collection Time: 03/26/21  3:57 PM   Specimen: BLOOD  Result Value Ref Range Status   Specimen Description   Final    BLOOD RIGHT ANTECUBITAL Performed at [facility], [address] [place], [place] [address]    Special Requests   Final    BOTTLES DRAWN AEROBIC AND ANAEROBIC Blood Culture adequate volume Performed at [facility], [place], [place] [address]    Culture  Setup Time   Final    GRAM POSITIVE RODS ANAEROBIC BOTTLE ONLY CRITICAL RESULT CALLED TO, READ BACK BY AND VERIFIED WITH: L [email or web address]  03/28/21 [place] Performed at [facility], [place] [address], [place], [place] [address]    Culture GRAM POSITIVE RODS  Final   Report Status PENDING  Incomplete         Radiology Studies: No results found.      Scheduled Meds:  docusate sodium  100 mg Oral Daily   DULoxetine  30 mg Oral q AM   DULoxetine  60 mg Oral q AM   enoxaparin (LOVENOX) injection  30 mg Subcutaneous Q24H    feeding supplement  237 mL Oral TID WC   fluticasone furoate-vilanterol  1 puff Inhalation Daily   And   umeclidinium bromide  1 puff Inhalation Daily   gabapentin  100 mg Oral QHS   insulin aspart  0-5 Units Subcutaneous QHS   insulin aspart  0-9 Units Subcutaneous TID WC   insulin glargine-yfgn  5 Units Subcutaneous Daily   levothyroxine  50 mcg Oral Daily   mirtazapine  15 mg Oral QHS   montelukast  10 mg Oral Daily   nystatin cream   Topical BID   pantoprazole  40 mg Oral Daily   polyethylene glycol  17 g Oral Daily   predniSONE  1 mg Oral BID WC   rosuvastatin  10 mg Oral QPM   Continuous Infusions:  cefTRIAXone (ROCEPHIN)  IV 1 g (03/29/21 1839)     LOS: 2 days    Time spent: 35 mins    Kathie Dike, MD [facility]   If 7PM-7AM, please contact night-coverage [email or web address]  03/29/2021, 9:30 PM

## 2021-03-29 NOTE — NC FL2 (Signed)
[place] LEVEL OF CARE SCREENING TOOL     IDENTIFICATION  Patient Name: Kayla Thompson Birthdate: 01-21-1936 Sex: female Admission Date (Current Location): 03/26/2021  [facility] and [place] Number:  Herbalist and Address:  [facility],  [place] , [place]      Provider Number: [id number]  Attending Physician Name and Address:  Kathie Dike, MD  Relative Name and Phone Number:  [name],[name] Daughter [phone]    Shireen Quan Granddaughter   [phone]  [facility] Daughter   [phone]    Current Level of Care: SNF Recommended Level of Care: [place] Prior Approval Number:    Date Approved/Denied:   PASRR Number: [id number]  Discharge Plan: SNF    Current Diagnoses: Patient Active Problem List   Diagnosis Date Noted   COVID-19 03/27/2021   Severe sepsis ([place]) 03/27/2021   Pressure injury of skin 09/30/1973   Acute metabolic encephalopathy 88/32/5498   Constipation 03/11/2021   Weight loss 03/11/2021   Pain due to onychomycosis of toenails of both feet 01/27/2021   Chronic obstructive pulmonary disease ([place]) 11/20/2020   Other chronic pain    UTI due to extended-spectrum beta lactamase (ESBL) producing Escherichia coli 07/30/2020   Hypertension associated with diabetes ([place]) 05/07/2020   Hyperlipidemia associated with type 2 diabetes mellitus ([place]) 05/07/2020   Depression 05/07/2020   PAD (peripheral artery disease) ([place]) 02/05/2020   Lower extremity edema 11/20/2019   Dysuria 05/11/2019   Upper esophageal web 03/27/2019   Iron deficiency anemia 03/27/2019   Candida esophagitis ([place]) 02/14/2019   UTI (urinary tract infection) 01/25/2019   Acute lower UTI 01/25/2019   Encounter for general adult medical examination with abnormal findings 01/09/2019   Abnormal barium swallow 12/28/2018   History of colonic polyps 12/28/2018   Dysphagia 12/28/2018   Diabetic foot ulcer ([place])  12/05/2018   Rotator cuff arthropathy, left 11/16/2018   Degenerative arthritis of left knee 11/16/2018   Insomnia 09/26/2018   Leukocytosis 10/17/2017   Severe protein-calorie malnutrition ([place]) 10/10/2017   Memory changes 07/29/2017   Granulomatous lung disease ([place]) 06/14/2017   Tracheobronchomalacia 06/14/2017   Dizziness 03/04/2017   Exercise hypoxemia 07/12/2016   Chronic heart failure with preserved ejection fraction ([place]) 12/25/2015   GERD (gastroesophageal reflux disease) 12/25/2015   Epidermal inclusion cyst 09/02/2015   Muscle cramps 03/06/2015   Cough 01/07/2015   Bronchiectasis without acute exacerbation ([place]) 01/07/2015   PMR (polymyalgia rheumatica) ([place]) 01/07/2015   Osteoarthritis 01/07/2015   Asthma, chronic 11/12/2014   Anxiety state 11/12/2014   Hyperparathyroidism ([place]) 10/28/2014   Fibromyalgia 09/21/2014   Personal history of ovarian cancer 08/23/2014   Osteoporosis 08/23/2014   DM2 (diabetes mellitus, type 2) ([place]) 08/02/2014   HLD (hyperlipidemia) 08/02/2014   Hypothyroidism 08/02/2014    Orientation RESPIRATION BLADDER Height & Weight     Time, Situation, Place, Self  Normal Incontinent Weight: 92 lb 4.8 oz (41.9 kg) Height:  4\' 11"  (149.9 cm)  BEHAVIORAL SYMPTOMS/MOOD NEUROLOGICAL BOWEL NUTRITION STATUS      Incontinent Diet  AMBULATORY STATUS COMMUNICATION OF NEEDS Skin   Limited Assist Verbally PU Stage and Appropriate Care PU Stage 1 Dressing:  (PRN dressing changes)                     Personal Care Assistance Level of Assistance  Bathing, Feeding, Dressing Bathing Assistance: Limited assistance Feeding assistance: Limited assistance Dressing Assistance: Limited assistance     Functional Limitations Info  Hearing,  Sight, Speech Sight Info: Adequate Hearing Info: Adequate Speech Info: Adequate    SPECIAL CARE FACTORS FREQUENCY  PT (By licensed PT), OT (By licensed OT)     PT Frequency: Minimum 5x a week OT Frequency: Minimum  5x a week            Contractures Contractures Info: Not present    Additional Factors Info  Code Status, Allergies, Insulin Sliding Scale, Psychotropic, Isolation Precautions Code Status Info: DNR Allergies Info: Tape   Lasix (Furosemide)   Latex   Penicillins Psychotropic Info: mirtazapine (REMERON) tablet 15 mg Insulin Sliding Scale Info: insulin aspart (novoLOG) injection 0-9 Units 3x a day with meals Isolation Precautions Info: ESBL and Covid+ as of 03/26/21     Current Medications (03/29/2021):  This is the current hospital active medication list Current Facility-Administered Medications  Medication Dose Route Frequency Provider Last Rate Last Admin   acetaminophen (TYLENOL) tablet 650 mg  650 mg Oral Q6H PRN Shela Leff, MD   650 mg at 03/27/21 0047   albuterol (VENTOLIN HFA) 108 (90 Base) MCG/ACT inhaler 2 puff  2 puff Inhalation Q4H PRN Shela Leff, MD       cefTRIAXone (ROCEPHIN) 1 g in sodium chloride 0.9 % 100 mL IVPB  1 g Intravenous Q24H Shela Leff, MD 200 mL/hr at 03/28/21 1721 1 g at 03/28/21 1721   docusate sodium (COLACE) capsule 100 mg  100 mg Oral Daily Shela Leff, MD   100 mg at 03/29/21 1013   DULoxetine (CYMBALTA) DR capsule 30 mg  30 mg Oral q AM Shela Leff, MD   30 mg at 03/29/21 6160   DULoxetine (CYMBALTA) DR capsule 60 mg  60 mg Oral q AM Shela Leff, MD   60 mg at 03/29/21 0642   enoxaparin (LOVENOX) injection 30 mg  30 mg Subcutaneous Q24H Shela Leff, MD   30 mg at 03/29/21 1013   feeding supplement (ENSURE ENLIVE / ENSURE PLUS) liquid 237 mL  237 mL Oral TID WC Shela Leff, MD   237 mL at 03/29/21 1648   fluticasone furoate-vilanterol (BREO ELLIPTA) 100-25 MCG/ACT 1 puff  1 puff Inhalation Daily [place] Cyree Chuong J, DO   1 puff at 03/29/21 1015   And   umeclidinium bromide (INCRUSE ELLIPTA) 62.5 MCG/ACT 1 puff  1 puff Inhalation Daily [place] Zabria Liss J, DO   1 puff at 03/29/21 1014   gabapentin  (NEURONTIN) capsule 100 mg  100 mg Oral QHS Shela Leff, MD   100 mg at 03/28/21 2131   guaiFENesin-dextromethorphan (ROBITUSSIN DM) 100-10 MG/5ML syrup 5 mL  5 mL Oral Q4H PRN Shela Leff, MD       insulin aspart (novoLOG) injection 0-5 Units  0-5 Units Subcutaneous QHS Shela Leff, MD       insulin aspart (novoLOG) injection 0-9 Units  0-9 Units Subcutaneous TID WC Shela Leff, MD   2 Units at 03/29/21 1253   insulin glargine-yfgn [facility]) injection 5 Units  5 Units Subcutaneous Daily [place] Zaynah Chawla J, DO   5 Units at 03/29/21 1028   levothyroxine (SYNTHROID) tablet 50 mcg  50 mcg Oral Daily Shela Leff, MD   50 mcg at 03/29/21 0554   mirtazapine (REMERON) tablet 15 mg  15 mg Oral QHS Shela Leff, MD   15 mg at 03/28/21 2131   montelukast (SINGULAIR) tablet 10 mg  10 mg Oral Daily Shela Leff, MD   10 mg at 03/29/21 1013   nystatin cream (MYCOSTATIN)   Topical BID Kathie Dike,  MD   Given at 03/29/21 1014   pantoprazole (PROTONIX) EC tablet 40 mg  40 mg Oral Daily Shela Leff, MD   40 mg at 03/29/21 1013   polyethylene glycol (MIRALAX / GLYCOLAX) packet 17 g  17 g Oral Daily Shela Leff, MD   17 g at 03/29/21 1013   predniSONE (DELTASONE) tablet 1 mg  1 mg Oral BID WC Shela Leff, MD   1 mg at 03/29/21 1648   rosuvastatin (CRESTOR) tablet 10 mg  10 mg Oral QPM Shela Leff, MD   10 mg at 03/28/21 1816     Discharge Medications: Please see discharge summary for a list of discharge medications.  Relevant Imaging Results:  Relevant Lab Results:   Additional Information SSN [id number]  Covid+ as of 03/26/21  Ross Ludwig, LCSW

## 2021-03-29 NOTE — TOC Progression Note (Signed)
Transition of Care [facility]) - Progression Note    Patient Details  Name: ARELLY WHITTENBERG MRN: [id number] Date of Birth: [birth year]  Transition of [facility]) CM/SW Contact  Ross Ludwig, [place] Phone Number: 03/29/2021, 5:45 PM  Clinical Narrative:    Patient was Covid+ on 03/26/2021.  CSW spoke to patient's daughter [name] [phone]  is in [place].  Patient normally has [facility] care services and patient receives 24 hour care.  However per [facility], they can not work with patient if they are still Covid+.  CSW informed patient's daughter that a list of other private duty agencies can be emailed to her.  Per patient's daughter, she said that would be fine and she will try to find another agency that can see patient temporarily.  CSW asked if she would like CSW to try to find a SNF that can accept her and she said yes.  CSW informed her that most SNFs require patient to be 11 days post covid before they can accept, however there are a couple that will take Covid+ patients.  CSW to begin bed search in [facility].   Expected Discharge Plan: [place] Barriers to Discharge: No Barriers Identified  Expected Discharge Plan and Services Expected Discharge Plan: [place] arrangements for the past 2 months: Single Family Home                                       Social Determinants of Health (SDOH) Interventions    Readmission Risk Interventions Readmission Risk Prevention Plan 01/12/2021 08/04/2020 05/09/2020  Transportation Screening Complete Complete Complete  PCP or Specialist Appt within 5-7 Days - - Complete  PCP or Specialist Appt within 3-5 Days Complete - -  Home Care Screening - - Complete  HRI or Home Care Consult Complete - -  Palliative Care Screening Not Applicable - -  Medication Review ([place]) Complete - -  PCP or Specialist appointment within 3-5 days of discharge - Complete -  SW Recovery  Care/Counseling Consult - Complete -  [place] - Not Applicable -  Some recent data might be hidden

## 2021-03-30 DIAGNOSIS — N3000 Acute cystitis without hematuria: Secondary | ICD-10-CM | POA: Diagnosis not present

## 2021-03-30 DIAGNOSIS — U071 COVID-19: Secondary | ICD-10-CM | POA: Diagnosis not present

## 2021-03-30 DIAGNOSIS — J4530 Mild persistent asthma, uncomplicated: Secondary | ICD-10-CM | POA: Diagnosis not present

## 2021-03-30 DIAGNOSIS — G9341 Metabolic encephalopathy: Secondary | ICD-10-CM | POA: Diagnosis not present

## 2021-03-30 LAB — GLUCOSE, CAPILLARY
Glucose-Capillary: 101 mg/dL — ABNORMAL HIGH (ref 70–99)
Glucose-Capillary: 189 mg/dL — ABNORMAL HIGH (ref 70–99)
Glucose-Capillary: 192 mg/dL — ABNORMAL HIGH (ref 70–99)
Glucose-Capillary: 198 mg/dL — ABNORMAL HIGH (ref 70–99)

## 2021-03-30 MED ORDER — SODIUM CHLORIDE 0.9 % IV SOLN
INTRAVENOUS | Status: AC
  Filled 2021-03-30: qty 20

## 2021-03-30 MED ORDER — MELATONIN 5 MG PO TABS
5.0000 mg | ORAL_TABLET | Freq: Once | ORAL | Status: AC
  Administered 2021-03-30: 5 mg via ORAL
  Filled 2021-03-30: qty 1

## 2021-03-30 NOTE — Progress Notes (Signed)
PROGRESS NOTE    Kayla Thompson  TIW:[id number] DOB: [birth year] DOA: 03/26/2021 PCP: [name], MD    Brief Narrative:   Kayla Thompson is a 86 y.o. female with medical history significant of chronic diastolic CHF, hypertension, PAD, frequent UTIs, PMR on chronic steroids, asthma, insulin-dependent type 2 diabetes, hyperlipidemia, chronic constipation, hypothyroidism, depression, anxiety, fibromyalgia, osteoporosis, chronic pain, GERD, history of ovarian cancer status post TAH/BSO presented to the ED for evaluation of fever, URI symptoms, generalized weakness, somnolence, and poor oral intake.  In the ED, slightly tachycardic but not febrile or hypotensive.  Not hypoxic.  SARS-CoV-2 PCR test positive.  WBC 15.6.  Hemoglobin 10.2, stable.  Sodium 133, chronically low.  Creatinine 0.8, stable.  ALT 92, remainder of LFTs normal.  Lactic acid normal x2.  UA with negative nitrite, large amount of leukocytes, and 21-50 WBCs.  Urine culture pending.  Blood culture x2 drawn.  Chest x-ray negative for acute finding. Patient was given ceftriaxone, 1 L LR bolus, remdesivir, and Tylenol.     Assessment & Plan:   Principal Problem:   COVID-19 Active Problems:   Asthma, chronic   UTI (urinary tract infection)   Acute metabolic encephalopathy   Severe sepsis (HCC)   Pressure injury of skin   COVID-19 infection -Started on remdesivir -She is not hypoxic, no indication for steroids -Continue supportive measures -Overall inflammatory markers trending down -Remdesivir completed  Possible UTI -Continue on ceftriaxone -Urine culture group B strep  Severe sepsis, present on admission -Admitted with leukocytosis, tachycardia, encephalopathy with source being UTI -Overall sepsis physiology appears to have improved  Acute metabolic encephalopathy -At baseline, appears that she may have some cognitive deficits that had began to develop in the last 6 months -She was increasingly lethargic  on admission, appears to be improving now -Suspect encephalopathy related to infectious process -Mental status appears to have improved back to baseline  Polymyalgia rheumatica -Continue home dose of prednisone  Asthma -No shortness of breath or wheezing -Continue current treatments  Insulin-dependent type 2 diabetes -Continue on Semglee and sliding scale insulin  Generalized weakness -Seen by PT/OT with recommendations for home health PT -Discussed with granddaughter, patient normally has 24/7 caregivers -Unfortunately, due to her recent COVID diagnosis, it is likely that hired caregivers, be able to care for her at home -Daughter is working on caregiver coverage at home so that patient had adequate assistance on discharge and will have a better idea by tomorrow  DVT prophylaxis: enoxaparin (LOVENOX) injection 30 mg Start: 03/27/21 1000  Code Status: DNR Family Communication: updated daughter over the phone 1/9 Disposition Plan: Status is: Inpatient  Remains inpatient appropriate because: continued treatment of UTI and covid         Consultants:    Procedures:    Antimicrobials:  Ceftriaxone 1/5> Remdesivir 1/5>    Subjective: She is sleeping in bed but easily wakes up to voice. She continues to have some cough. Reports some pain in shoulders and neck.  Objective: Vitals:   03/29/21 1136 03/29/21 1957 03/30/21 0654 03/30/21 1128  BP: 123/79 127/78 135/80 132/62  Pulse: (!) 108 94 90 98  Resp: 17 18 16 16   Temp:  98.5 F (36.9 C) 98.9 F (37.2 C) 98.6 F (37 C)  TempSrc:  Oral Oral Oral  SpO2: 99% 100% 100% 96%  Weight:      Height:        Intake/Output Summary (Last 24 hours) at 03/30/2021 1930 Last data filed at 03/30/2021 1800  Gross per 24 hour  Intake 988.79 ml  Output 2275 ml  Net -1286.21 ml   Filed Weights   03/26/21 2029  Weight: 41.9 kg    Examination:  General exam: Appears calm and comfortable  Respiratory system: Coarse breath  sounds bilaterally. Respiratory effort normal. Cardiovascular system: S1 & S2 heard, RRR. No JVD, murmurs, rubs, gallops or clicks. No pedal edema. Gastrointestinal system: Abdomen is nondistended, soft and nontender. No organomegaly or masses felt. Normal bowel sounds heard. Central nervous system: Alert and oriented. No focal neurological deficits. Extremities: Symmetric 5 x 5 power. Skin: No rashes, lesions or ulcers Psychiatry: Judgement and insight appear normal. Mood & affect appropriate.     Data Reviewed: I have personally reviewed following labs and imaging studies  CBC: Recent Labs  Lab 03/26/21 1141 03/27/21 0048 03/28/21 0416 03/29/21 0351  WBC 15.6* 14.4* 12.5* 11.3*  NEUTROABS 11.1*  --   --   --   HGB 10.2* 9.1* 9.0* 9.4*  HCT 30.5* 26.8* 27.1* 28.6*  MCV 107.4* 109.8* 112.9* 113.5*  PLT 405* 332 332 607   Basic Metabolic Panel: Recent Labs  Lab 03/26/21 1141 03/27/21 0048 03/28/21 0416 03/29/21 0351  NA 133* 131* 132* 134*  K 3.7 3.3* 3.4* 3.5  CL 98 100 100 100  CO2 24 22 24 24   GLUCOSE 164* 133* 96 158*  BUN 19 14 17 19   CREATININE 0.80 0.65 0.65 0.89  CALCIUM 10.0 8.6* 8.8* 9.0   GFR: Estimated Creatinine Clearance: 31.7 mL/min (by C-G formula based on SCr of 0.89 mg/dL). Liver Function Tests: Recent Labs  Lab 03/26/21 1141 03/27/21 0048 03/28/21 0416  AST 29 23 33  ALT 92* 70* 57*  ALKPHOS 66 53 46  BILITOT 0.5 0.5 0.6  PROT 7.1 6.2* 6.0*  ALBUMIN 3.8 2.8* 2.8*   No results for input(s): LIPASE, AMYLASE in the last 168 hours. Recent Labs  Lab 03/27/21 0338  AMMONIA 22   Coagulation Profile: No results for input(s): INR, PROTIME in the last 168 hours. Cardiac Enzymes: No results for input(s): CKTOTAL, CKMB, CKMBINDEX, TROPONINI in the last 168 hours. BNP (last 3 results) Recent Labs    11/26/20 1339  PROBNP 291   HbA1C: No results for input(s): HGBA1C in the last 72 hours.  CBG: Recent Labs  Lab 03/29/21 1619  03/29/21 1953 03/30/21 0742 03/30/21 1158 03/30/21 1644  GLUCAP 118* 120* 101* 189* 192*   Lipid Profile: No results for input(s): CHOL, HDL, LDLCALC, TRIG, CHOLHDL, LDLDIRECT in the last 72 hours. Thyroid Function Tests: No results for input(s): TSH, T4TOTAL, FREET4, T3FREE, THYROIDAB in the last 72 hours.  Anemia Panel: No results for input(s): VITAMINB12, FOLATE, FERRITIN, TIBC, IRON, RETICCTPCT in the last 72 hours.  Sepsis Labs: Recent Labs  Lab 03/26/21 1141 03/26/21 1605  LATICACIDVEN 1.1 1.0    Recent Results (from the past 240 hour(s))  Resp Panel by RT-PCR (Flu A&B, Covid) Nasopharyngeal Swab     Status: Abnormal   Collection Time: 03/26/21 11:36 AM   Specimen: Nasopharyngeal Swab; Nasopharyngeal(NP) swabs in vial transport medium  Result Value Ref Range Status   SARS Coronavirus 2 by RT PCR POSITIVE (A) NEGATIVE Final    Comment: (NOTE) SARS-CoV-2 target nucleic acids are DETECTED.  The SARS-CoV-2 RNA is generally detectable in upper respiratory specimens during the acute phase of infection. Positive results are indicative of the presence of the identified virus, but do not rule out bacterial infection or co-infection with other pathogens not detected by  the test. Clinical correlation with patient history and other diagnostic information is necessary to determine patient infection status. The expected result is Negative.  Fact Sheet for Patients: EntrepreneurPulse.com.au  Fact Sheet for Healthcare Providers: IncredibleEmployment.be  This test is not yet approved or cleared by the [place] FDA and  has been authorized for detection and/or diagnosis of SARS-CoV-2 by FDA under an Emergency Use Authorization (EUA).  This EUA will remain in effect (meaning this test can be used) for the duration of  the COVID-19 declaration under Section 564(b)(1) of the A ct, 21 U.S.C. section 360bbb-3(b)(1), unless the authorization  is terminated or revoked sooner.     Influenza A by PCR NEGATIVE NEGATIVE Final   Influenza B by PCR NEGATIVE NEGATIVE Final    Comment: (NOTE) The Xpert Xpress SARS-CoV-2/FLU/RSV plus assay is intended as an aid in the diagnosis of influenza from Nasopharyngeal swab specimens and should not be used as a sole basis for treatment. Nasal washings and aspirates are unacceptable for Xpert Xpress SARS-CoV-2/FLU/RSV testing.  Fact Sheet for Patients: EntrepreneurPulse.com.au  Fact Sheet for Healthcare Providers: IncredibleEmployment.be  This test is not yet approved or cleared by the [place] FDA and has been authorized for detection and/or diagnosis of SARS-CoV-2 by FDA under an Emergency Use Authorization (EUA). This EUA will remain in effect (meaning this test can be used) for the duration of the COVID-19 declaration under Section 564(b)(1) of the Act, 21 U.S.C. section 360bbb-3(b)(1), unless the authorization is terminated or revoked.  Performed at [facility], [address] [place], [place] [address]   Urine Culture     Status: Abnormal   Collection Time: 03/26/21  1:35 PM   Specimen: Urine, Random  Result Value Ref Range Status   Specimen Description   Final    URINE, RANDOM Performed at [facility], [address] [place], [place] [address]    Special Requests   Final    NONE Performed at [facility], [address] [place], [place] [address]    Culture (A)  Final    80,000 COLONIES/mL GROUP B STREP(S.AGALACTIAE)ISOLATED TESTING AGAINST S. AGALACTIAE NOT ROUTINELY PERFORMED DUE TO PREDICTABILITY OF AMP/PEN/VAN SUSCEPTIBILITY. Performed at [facility], [place] [address], [place], [place] [address]    Report Status 03/27/2021 FINAL  Final  Culture, blood (routine x 2)     Status: None (Preliminary result)   Collection Time: 03/26/21  3:06 PM   Specimen: BLOOD  RIGHT FOREARM  Result Value Ref Range Status   Specimen Description   Final    BLOOD RIGHT FOREARM Performed at [facility], [address] [place], [place] [address]    Special Requests   Final    BOTTLES DRAWN AEROBIC AND ANAEROBIC Blood Culture results may not be optimal due to an excessive volume of blood received in culture bottles Performed at [facility], [address], [place], [place] [address]    Culture   Final    NO GROWTH 4 DAYS Performed at [facility], [place] [address] [place], [place] [address]    Report Status PENDING  Incomplete  Culture, blood (routine x 2)     Status: None (Preliminary result)   Collection Time: 03/26/21  3:57 PM   Specimen: BLOOD  Result Value Ref Range Status   Specimen Description   Final    BLOOD RIGHT ANTECUBITAL Performed at [facility], [address] [place], [place] [address]    Special  Requests   Final    BOTTLES DRAWN AEROBIC AND ANAEROBIC Blood Culture adequate volume Performed at [facility], [address], [place], [place] [address]    Culture  Setup Time   Final    GRAM POSITIVE RODS ANAEROBIC BOTTLE ONLY CRITICAL RESULT CALLED TO, READ BACK BY AND VERIFIED WITH: L POINDEXTER,[email or web address]  03/28/21 [place]    Culture   Final    GRAM POSITIVE RODS CULTURE REINCUBATED FOR BETTER GROWTH Performed at [facility], [place] [address], [place], [place] [address]    Report Status PENDING  Incomplete         Radiology Studies: No results found.      Scheduled Meds:  docusate sodium  100 mg Oral Daily   DULoxetine  30 mg Oral q AM   DULoxetine  60 mg Oral q AM   enoxaparin (LOVENOX) injection  30 mg Subcutaneous Q24H   feeding supplement  237 mL Oral TID WC   fluticasone furoate-vilanterol  1 puff Inhalation Daily   And   umeclidinium bromide  1 puff Inhalation Daily   gabapentin  100 mg Oral QHS   insulin aspart  0-5 Units  Subcutaneous QHS   insulin aspart  0-9 Units Subcutaneous TID WC   insulin glargine-yfgn  5 Units Subcutaneous Daily   levothyroxine  50 mcg Oral Daily   mirtazapine  15 mg Oral QHS   montelukast  10 mg Oral Daily   nystatin cream   Topical BID   pantoprazole  40 mg Oral Daily   polyethylene glycol  17 g Oral Daily   predniSONE  1 mg Oral BID WC   rosuvastatin  10 mg Oral QPM   Continuous Infusions:  cefTRIAXone (ROCEPHIN)  IV Stopped (03/30/21 1615)     LOS: 3 days    Time spent: 35 mins    Kathie Dike, MD [facility]   If 7PM-7AM, please contact night-coverage [email or web address]  03/30/2021, 7:30 PM

## 2021-03-30 NOTE — Care Management Important Message (Signed)
Important Message  Patient Details IM Letter placed in Patients room. Name: Kayla Thompson MRN: [id number] Date of Birth: October 31, 1935   Medicare Important Message Given:  Yes     Kerin Salen 03/30/2021, 2:14 PM

## 2021-03-30 NOTE — TOC Progression Note (Signed)
Transition of Care [facility]) - Progression Note    Patient Details  Name: Kayla Thompson MRN: [id number] Date of Birth: 1936-02-16  Transition of Care [facility]) CM/SW Contact  Purcell Mouton, RN Phone Number: 03/30/2021, 3:40 PM  Clinical Narrative:    Spoke with pt's daughter concerning pt's discharge to home vs SNF. [name] will know in the AM if [name] can follow pt at home.    Expected Discharge Plan: [place] Barriers to Discharge: No Barriers Identified  Expected Discharge Plan and Services Expected Discharge Plan: [place] arrangements for the past 2 months: Single Family Home                                       Social Determinants of Health (SDOH) Interventions    Readmission Risk Interventions Readmission Risk Prevention Plan 01/12/2021 08/04/2020 05/09/2020  Transportation Screening Complete Complete Complete  PCP or Specialist Appt within 5-7 Days - - Complete  PCP or Specialist Appt within 3-5 Days Complete - -  Home Care Screening - - Complete  HRI or Home Care Consult Complete - -  Palliative Care Screening Not Applicable - -  Medication Review ([place]) Complete - -  PCP or Specialist appointment within 3-5 days of discharge - Complete -  SW Recovery Care/Counseling Consult - Complete -  [place] - Not Applicable -  Some recent data might be hidden

## 2021-03-31 ENCOUNTER — Other Ambulatory Visit: Payer: Self-pay | Admitting: Internal Medicine

## 2021-03-31 DIAGNOSIS — A419 Sepsis, unspecified organism: Principal | ICD-10-CM

## 2021-03-31 DIAGNOSIS — E860 Dehydration: Secondary | ICD-10-CM | POA: Diagnosis not present

## 2021-03-31 DIAGNOSIS — G9341 Metabolic encephalopathy: Secondary | ICD-10-CM | POA: Diagnosis not present

## 2021-03-31 DIAGNOSIS — R6520 Severe sepsis without septic shock: Secondary | ICD-10-CM

## 2021-03-31 DIAGNOSIS — U071 COVID-19: Secondary | ICD-10-CM | POA: Diagnosis not present

## 2021-03-31 LAB — CBC
HCT: 26.8 % — ABNORMAL LOW (ref 36.0–46.0)
Hemoglobin: 8.7 g/dL — ABNORMAL LOW (ref 12.0–15.0)
MCH: 36.7 pg — ABNORMAL HIGH (ref 26.0–34.0)
MCHC: 32.5 g/dL (ref 30.0–36.0)
MCV: 113.1 fL — ABNORMAL HIGH (ref 80.0–100.0)
Platelets: 371 10*3/uL (ref 150–400)
RBC: 2.37 MIL/uL — ABNORMAL LOW (ref 3.87–5.11)
RDW: 14.4 % (ref 11.5–15.5)
WBC: 15.4 10*3/uL — ABNORMAL HIGH (ref 4.0–10.5)
nRBC: 0 % (ref 0.0–0.2)

## 2021-03-31 LAB — GLUCOSE, CAPILLARY
Glucose-Capillary: 155 mg/dL — ABNORMAL HIGH (ref 70–99)
Glucose-Capillary: 200 mg/dL — ABNORMAL HIGH (ref 70–99)

## 2021-03-31 LAB — COMPREHENSIVE METABOLIC PANEL
ALT: 57 U/L — ABNORMAL HIGH (ref 0–44)
AST: 28 U/L (ref 15–41)
Albumin: 2.7 g/dL — ABNORMAL LOW (ref 3.5–5.0)
Alkaline Phosphatase: 51 U/L (ref 38–126)
Anion gap: 10 (ref 5–15)
BUN: 29 mg/dL — ABNORMAL HIGH (ref 8–23)
CO2: 24 mmol/L (ref 22–32)
Calcium: 9.3 mg/dL (ref 8.9–10.3)
Chloride: 104 mmol/L (ref 98–111)
Creatinine, Ser: 0.82 mg/dL (ref 0.44–1.00)
GFR, Estimated: >60 mL/min (ref >60)
Glucose, Bld: 198 mg/dL — ABNORMAL HIGH (ref 70–99)
Potassium: 4.1 mmol/L (ref 3.5–5.1)
Sodium: 138 mmol/L (ref 135–145)
Total Bilirubin: 0.4 mg/dL (ref 0.3–1.2)
Total Protein: 5.8 g/dL — ABNORMAL LOW (ref 6.5–8.1)

## 2021-03-31 LAB — CULTURE, BLOOD (ROUTINE X 2)
Culture: NO GROWTH
Special Requests: ADEQUATE

## 2021-03-31 LAB — C-REACTIVE PROTEIN: CRP: 4.8 mg/dL — ABNORMAL HIGH (ref <1.0)

## 2021-03-31 MED ORDER — ACETAMINOPHEN ER 650 MG PO TBCR: 650.0000 mg | EXTENDED_RELEASE_TABLET | ORAL | Status: DC

## 2021-03-31 MED ORDER — DOXYCYCLINE HYCLATE 100 MG PO CAPS: 100.0000 mg | ORAL_CAPSULE | Freq: Two times a day (BID) | ORAL | 0 refills | Status: DC

## 2021-03-31 MED ORDER — PREDNISONE 1 MG PO TABS: 1.0000 mg | ORAL_TABLET | Freq: Two times a day (BID) | ORAL | Status: DC

## 2021-03-31 NOTE — Progress Notes (Signed)
Order to discharge pt home.  Discharge instructions/AVS placed in pt packet.  Called and spoke with pts daughter, [name], about reviewing the AVS and medication regimen in MyChart.  Rhonda verbalized understanding.  Will continue to monitor pt until PTAR transport.

## 2021-03-31 NOTE — Consult Note (Signed)
[facility] Inpatient Consult   03/31/2021  Kayla Thompson 1935-12-27 [id number]  [place] [facility] CM)   Patient chart reviewed due to extreme high risk score for unplanned readmission. Assessed for post hospital ambulatory care needs with [facility] CM services. Per review, primary provider office offers chronic care management services for chronic disease management and care coordination.   Plan: Will continue to follow for disposition.   Of note, [facility] Care Management services does not replace or interfere with any services that are arranged by inpatient case management or social work.   Kayla Cedars, MSN, RN [facility] [employer] [phone]  Toll free office [phone]

## 2021-03-31 NOTE — Plan of Care (Signed)

## 2021-03-31 NOTE — Progress Notes (Signed)
Physical Therapy Treatment Patient Details Name: Kayla Thompson MRN: [id number] DOB: Dec 05, 1935 Today's Date: 03/31/2021   History of Present Illness CONSANDRA LASKE is a 86 y.o. female admitted with Covid-19. PMH: CHF, HTN, PAD, frequent UTIs, asthma, diabetes, hyperlipidemia, hypothyroidism, depression, anxiety, fibromyalgia, osteoporosis, chronic pain, GERD, history of ovarian cancer s/p TAH/BSO    PT Comments    Pt requiring physical assist for bed mobility and standing today.  Pt to d/c and uncertain if current caregivers can provide physical assist.  Would recommend SNF if caregivers cannot provide physical assist.     Recommendations for follow up therapy are one component of a multi-disciplinary discharge planning process, led by the attending physician.  Recommendations may be updated based on patient status, additional functional criteria and insurance authorization.  Follow Up Recommendations  Skilled nursing-short term rehab (<3 hours/day)     Assistance Recommended at Discharge Frequent or constant Supervision/Assistance  Patient can return home with the following A lot of help with walking and/or transfers;A lot of help with bathing/dressing/bathroom;Assistance with feeding;Assistance with cooking/housework   Equipment Recommendations  None recommended by PT    Recommendations for Other Services       Precautions / Restrictions Precautions Precautions: Fall     Mobility  Bed Mobility Overal bed mobility: Needs Assistance Bed Mobility: Supine to Sit;Sit to Supine     Supine to sit: Min assist Sit to supine: Mod assist   General bed mobility comments: increased time and multimodal cues provided, pt utilized bed rail to self assist trunk upright; assist required for LEs onto bed    Transfers Overall transfer level: Needs assistance Equipment used: Rolling walker (2 wheels) Transfers: Sit to/from Stand Sit to Stand: Min assist           General  transfer comment: pt with retropulsion against bed to self assist into standing and maintain, light assist to start rise; pt not steady to march in place or transfer and also declined attempting this    Ambulation/Gait                   Stairs             Wheelchair Mobility    Modified Rankin (Stroke Patients Only)       Balance Overall balance assessment: Needs assistance Sitting-balance support: Feet supported;Bilateral upper extremity supported Sitting balance-Leahy Scale: Poor     Standing balance support: Reliant on assistive device for balance;Bilateral upper extremity supported Standing balance-Leahy Scale: Zero                              Cognition Arousal/Alertness: Awake/alert Behavior During Therapy: Flat affect Overall Cognitive Status: History of cognitive impairments - at baseline                                 General Comments: patient was able to follow one step commands with increased time and redirection        Exercises      General Comments        Pertinent Vitals/Pain Pain Assessment: Faces Faces Pain Scale: No hurt    Home Living                          Prior Function            PT Goals (  current goals can now be found in the care plan section) Progress towards PT goals: Progressing toward goals    Frequency    Min 2X/week      PT Plan Current plan remains appropriate    Co-evaluation              AM-PAC PT "6 Clicks" Mobility   Outcome Measure  Help needed turning from your back to your side while in a flat bed without using bedrails?: Total Help needed moving from lying on your back to sitting on the side of a flat bed without using bedrails?: Total Help needed moving to and from a bed to a chair (including a wheelchair)?: Total Help needed standing up from a chair using your arms (e.g., wheelchair or bedside chair)?: Total Help needed to walk in hospital  room?: Total Help needed climbing 3-5 steps with a railing? : Total 6 Click Score: 6    End of Session   Activity Tolerance: Patient tolerated treatment well Patient left: in bed;with call bell/phone within reach Nurse Communication: Mobility status PT Visit Diagnosis: Other abnormalities of gait and mobility (R26.89);Muscle weakness (generalized) (M62.81)     Time: 1101-1120 PT Time Calculation (min) (ACUTE ONLY): 19 min  Charges:  $Therapeutic Activity: 8-22 mins                    Jannette Spanner PT, DPT Acute Rehabilitation Services Pager: [phone] Office: [place] 03/31/2021, 12:48 PM

## 2021-03-31 NOTE — Plan of Care (Signed)

## 2021-03-31 NOTE — Discharge Summary (Signed)
Physician Discharge Summary  Kayla Thompson NTI:[id number] DOB: 09/07/1935 DOA: 03/26/2021  PCP: [name], MD  Admit date: 03/26/2021 Discharge date: 03/31/2021  Admitted From: Home Disposition: Home  Recommendations for Outpatient Follow-up:  Follow up with PCP in 1-2 weeks Please obtain BMP/CBC in one week Follow-up with ID clinic will be scheduled for positive blood culture  Home Health: Home health PT, OT Equipment/Devices:  Discharge Condition: Stable CODE STATUS: DNR Diet recommendation: Regular diet  Brief/Interim Summary: Kayla Thompson is a 86 y.o. female with medical history significant of chronic diastolic CHF, hypertension, PAD, frequent UTIs, PMR on chronic steroids, asthma, insulin-dependent type 2 diabetes, hyperlipidemia, chronic constipation, hypothyroidism, depression, anxiety, fibromyalgia, osteoporosis, chronic pain, GERD, history of ovarian cancer status post TAH/BSO presented to the ED for evaluation of fever, URI symptoms, generalized weakness, somnolence, and poor oral intake.  In the ED, slightly tachycardic but not febrile or hypotensive.  Not hypoxic.  SARS-CoV-2 PCR test positive.  WBC 15.6.  Hemoglobin 10.2, stable.  Sodium 133, chronically low.  Creatinine 0.8, stable.  ALT 92, remainder of LFTs normal.  Lactic acid normal x2.  UA with negative nitrite, large amount of leukocytes, and 21-50 WBCs.  Urine culture pending.  Blood culture x2 drawn.  Chest x-ray negative for acute finding. Patient was given ceftriaxone, 1 L LR bolus, remdesivir, and Tylenol.  Discharge Diagnoses:  Principal Problem:   COVID-19 Active Problems:   Asthma, chronic   UTI (urinary tract infection)   Acute metabolic encephalopathy   Severe sepsis (HCC)   Pressure injury of skin  COVID-19 infection -Started on remdesivir -She is not hypoxic, no indication for steroids -Continue supportive measures -Overall inflammatory markers trending down -Remdesivir completed    Possible UTI -She completed a course of ceftriaxone -Urine culture group B strep   Severe sepsis, present on admission -Admitted with leukocytosis, tachycardia, encephalopathy with source being UTI -Overall sepsis physiology appears to have improved   Acute metabolic encephalopathy -At baseline, appears that she may have some cognitive deficits that had began to develop in the last 6 months -She was increasingly lethargic on admission, appears to be improving now -Suspect encephalopathy related to infectious process -Mental status appears to have improved back to baseline   Polymyalgia rheumatica -Continue home dose of prednisone   Asthma -No shortness of breath or wheezing -Continue current treatments   Insulin-dependent type 2 diabetes -Continue on Semglee and sliding scale insulin  Positive blood culture -Noted to have 1 positive blood culture which returned after discharge for actinomyces -Pharmacy alerted me of positive culture after discussing with infectious disease, Dr. Juleen China -Recommendations were to call in doxycycline 100 mg twice daily for 14 days -Patient will be followed up in the infectious disease clinic.   Generalized weakness -Seen by PT/OT with recommendations for home health PT -Patient has 24-hour caregivers at home  Discharge Instructions  Discharge Instructions     Diet - low sodium heart healthy   Complete by: As directed    Increase activity slowly   Complete by: As directed    No wound care   Complete by: As directed       Allergies as of 03/31/2021       Reactions   Tape Other (See Comments)   SKIN IS VERY THIN AND DELICATE- will tear like paper!!   Lasix [furosemide] Other (See Comments)   Confusion- MD stopped this med   Latex Rash   Penicillins Rash   Has patient had a PCN  reaction causing immediate rash, facial/tongue/throat swelling, SOB or lightheadedness with hypotension: No Has patient had a PCN reaction causing severe rash  involving mucus membranes or skin necrosis: No Has patient had a PCN reaction that required hospitalization: No Has patient had a PCN reaction occurring within the last 10 years: No If all of the above answers are "NO", then may proceed with Cephalosporin use.        Medication List     STOP taking these medications    clotrimazole-betamethasone cream Commonly known as: LOTRISONE   hydrocortisone 25 MG suppository Commonly known as: ANUSOL-HC   megestrol 40 MG tablet Commonly known as: MEGACE   senna 8.6 MG Tabs tablet Commonly known as: SENOKOT       TAKE these medications    acetaminophen 650 MG CR tablet Commonly known as: TYLENOL Take 1 tablet (650 mg total) by mouth See admin instructions. Take 1,300 mg by mouth every 4-5 hours IN CONJUNCTION WITH one Percocet 7.5/325 tablet- for pain What changed: how much to take   albuterol 108 (90 Base) MCG/ACT inhaler Commonly known as: ProAir HFA Inhale 2 puffs into the lungs every 6 (six) hours as needed for wheezing or shortness of breath.   baclofen 10 MG tablet Commonly known as: LIORESAL TAKE 0.5-1 TABLET BY MOUTH TWICE DAILY AS NEEDED FOR MUSCLE SPASMS OR PAIN   BD Pen Needle Nano 2nd Gen 32G X 4 MM Misc Generic drug: Insulin Pen Needle USE AS DIRECTED 4 TIMES A DAY   busPIRone 5 MG tablet Commonly known as: BUSPAR Take 5-10 mg by mouth daily as needed (for anxiety).   CALTRATE 600 PO Take 600 mg by mouth in the morning and at bedtime.   cholecalciferol 25 MCG (1000 UNIT) tablet Commonly known as: VITAMIN D3 Take 2,000 Units by mouth in the morning.   denosumab 60 MG/ML Sosy injection Commonly known as: PROLIA Inject 60 mg into the skin every 6 (six) months.   docusate sodium 100 MG capsule Commonly known as: COLACE Take 1 capsule (100 mg total) by mouth daily.   DULoxetine 30 MG capsule Commonly known as: CYMBALTA Take 1 capsule (30 mg total) by mouth daily. What changed: when to take this    DULoxetine 60 MG capsule Commonly known as: CYMBALTA Take 1 capsule (60 mg total) by mouth daily. What changed: when to take this   estradiol 0.1 MG/GM vaginal cream Commonly known as: ESTRACE Place 1 Applicatorful vaginally See admin instructions. Takes 2 times a week   feeding supplement Liqd Take 237 mLs by mouth 2 (two) times daily between meals. What changed: when to take this   freestyle lancets Use to test blood sugar 3 times daily. Dx: E11.65   FREESTYLE LITE test strip Generic drug: glucose blood USE TO TEST BLOOD SUGAR 3 TIMES DAILY.   gabapentin 100 MG capsule Commonly known as: NEURONTIN Take 100 mg by mouth at bedtime.   insulin glargine 100 UNIT/ML Solostar Pen Commonly known as: LANTUS Inject 5 Units into the skin in the morning.   insulin lispro 100 UNIT/ML KwikPen Commonly known as: HumaLOG KwikPen Inject 2-4 Units into the skin daily before supper. What changed:  how much to take when to take this additional instructions   ipratropium-albuterol 0.5-2.5 (3) MG/3ML Soln Commonly known as: DUONEB Inhale 3 mLs into the lungs every 4 (four) hours as needed.   levothyroxine 50 MCG tablet Commonly known as: SYNTHROID TAKE 1 TABLET BY MOUTH EVERY DAY BEFORE BREAKFAST  linaclotide 72 MCG capsule Commonly known as: Linzess Take 1 capsule (72 mcg total) by mouth daily before breakfast.   methenamine 1 g tablet Commonly known as: MANDELAMINE Take 1,000 mg by mouth in the morning and at bedtime.   mirtazapine 15 MG tablet Commonly known as: REMERON TAKE 1 TABLET BY MOUTH EVERYDAY AT BEDTIME   montelukast 10 MG tablet Commonly known as: SINGULAIR TAKE 1 TABLET BY MOUTH EVERY DAY   Mucinex DM Maximum Strength 60-1200 MG Tb12 Take 1 tablet by mouth in the morning and at bedtime.   naloxone 4 MG/0.1ML Liqd nasal spray kit Commonly known as: NARCAN Place 1 spray into the nose once as needed (overdose).   nitrofurantoin (macrocrystal-monohydrate)  100 MG capsule Commonly known as: MACROBID Take 100 mg by mouth daily.   omeprazole 20 MG capsule Commonly known as: PRILOSEC TAKE 1 CAPSULE TWICE A DAY BEFORE A MEAL What changed: See the new instructions.   oxyCODONE-acetaminophen 7.5-325 MG tablet Commonly known as: PERCOCET Take 1 tablet by mouth See admin instructions. Take 1 tablet by mouth every 4-5 hours (IN CONJUNCTION WITH 1,300 mg of Tylenol)   polyethylene glycol 17 g packet Commonly known as: MIRALAX / GLYCOLAX Take 17 g by mouth daily.   predniSONE 1 MG tablet Commonly known as: DELTASONE Take 1 tablet (1 mg total) by mouth in the morning and at bedtime.   PREVAGEN PO Take 1 tablet by mouth every evening.   rosuvastatin 10 MG tablet Commonly known as: CRESTOR Take 1 tablet (10 mg total) by mouth every evening.   TART CHERRY ADVANCED PO Take 1,000 mg by mouth every evening.   Trelegy Ellipta 100-62.5-25 MCG/ACT Aepb Generic drug: Fluticasone-Umeclidin-Vilant INHALE 1 PUFF INTO THE LUNGS DAILY. RINSE MOUTH What changed: when to take this   TURMERIC PO Take 1,000 mg by mouth in the morning.   VITAMIN B 12 PO Take 2,500 mcg by mouth every Thursday.   vitamin C 1000 MG tablet Take 1,000 mg by mouth in the morning and at bedtime.        Allergies  Allergen Reactions   Tape Other (See Comments)    SKIN IS VERY THIN AND DELICATE- will tear like paper!!   Lasix [Furosemide] Other (See Comments)    Confusion- MD stopped this med   Latex Rash   Penicillins Rash    Has patient had a PCN reaction causing immediate rash, facial/tongue/throat swelling, SOB or lightheadedness with hypotension: No Has patient had a PCN reaction causing severe rash involving mucus membranes or skin necrosis: No Has patient had a PCN reaction that required hospitalization: No Has patient had a PCN reaction occurring within the last 10 years: No If all of the above answers are "NO", then may proceed with Cephalosporin use.      Consultations:    Procedures/Studies: CT HEAD WO CONTRAST (5MM)  Result Date: 03/27/2021 CLINICAL DATA:  Delirium, general weakness, fever, confusion. EXAM: CT HEAD WITHOUT CONTRAST TECHNIQUE: Contiguous axial images were obtained from the base of the skull through the vertex without intravenous contrast. COMPARISON:  05/07/2020. FINDINGS: Brain: No acute intracranial hemorrhage, midline shift or mass effect. No extra-axial fluid collection. There is diffuse cortical atrophy. Subcortical and periventricular white matter hypodensities are present bilaterally. There is no hydrocephalus. Vascular: Atherosclerotic calcification of the carotid siphons. No hyperdense vessel. Skull: Normal. Negative for fracture or focal lesion. Sinuses/Orbits: Mucosal thickening is present in the ethmoid air cells and maxillary sinuses bilaterally. An air-fluid level seen in the left  sphenoid sinus. The orbits are within normal limits. Other: There chronic sclerosis in the inferior aspect of the mastoid air cells on the right. IMPRESSION: 1. No acute intracranial process. 2. Atrophy with chronic microvascular ischemic changes. Electronically Signed   By: Brett Fairy M.D.   On: 03/27/2021 03:01   DG Chest Port 1 View  Result Date: 03/26/2021 CLINICAL DATA:  Fevers EXAM: PORTABLE CHEST 1 VIEW COMPARISON:  Chest x-ray 11/26/2020 FINDINGS: Heart size is normal. Mediastinum appears stable. Calcified plaques in the aortic arch. Chronic prominent interstitial lung markings with no new focal consolidation identified. No pleural effusion or pneumothorax visualized. Severe degenerative changes of the shoulders noted. IMPRESSION: Chronic changes with no acute process identified. Electronically Signed   By: Ofilia Neas M.D.   On: 03/26/2021 12:29      Subjective: Denies any shortness of breath, feels well, wants to go home.  Discharge Exam: Vitals:   03/30/21 1128 03/30/21 2100 03/31/21 0605 03/31/21 1248  BP: 132/62  114/80 123/70 107/60  Pulse: 98 (!) 103 93 (!) 106  Resp: $Remo'16 20 16 16  'SanXk$ Temp: 98.6 F (37 C) 98.4 F (36.9 C) 98.3 F (36.8 C) 98.4 F (36.9 C)  TempSrc: Oral Oral Oral Oral  SpO2: 96% 99% 96% 97%  Weight:      Height:        General: Pt is alert, awake, not in acute distress Cardiovascular: RRR, S1/S2 +, no rubs, no gallops Respiratory: CTA bilaterally, no wheezing, no rhonchi Abdominal: Soft, NT, ND, bowel sounds + Extremities: no edema, no cyanosis    The results of significant diagnostics from this hospitalization (including imaging, microbiology, ancillary and laboratory) are listed below for reference.     Microbiology: Recent Results (from the past 240 hour(s))  Resp Panel by RT-PCR (Flu A&B, Covid) Nasopharyngeal Swab     Status: Abnormal   Collection Time: 03/26/21 11:36 AM   Specimen: Nasopharyngeal Swab; Nasopharyngeal(NP) swabs in vial transport medium  Result Value Ref Range Status   SARS Coronavirus 2 by RT PCR POSITIVE (A) NEGATIVE Final    Comment: (NOTE) SARS-CoV-2 target nucleic acids are DETECTED.  The SARS-CoV-2 RNA is generally detectable in upper respiratory specimens during the acute phase of infection. Positive results are indicative of the presence of the identified virus, but do not rule out bacterial infection or co-infection with other pathogens not detected by the test. Clinical correlation with patient history and other diagnostic information is necessary to determine patient infection status. The expected result is Negative.  Fact Sheet for Patients: EntrepreneurPulse.com.au  Fact Sheet for Healthcare Providers: IncredibleEmployment.be  This test is not yet approved or cleared by the [place] FDA and  has been authorized for detection and/or diagnosis of SARS-CoV-2 by FDA under an Emergency Use Authorization (EUA).  This EUA will remain in effect (meaning this test can be used) for the duration  of  the COVID-19 declaration under Section 564(b)(1) of the A ct, 21 U.S.C. section 360bbb-3(b)(1), unless the authorization is terminated or revoked sooner.     Influenza A by PCR NEGATIVE NEGATIVE Final   Influenza B by PCR NEGATIVE NEGATIVE Final    Comment: (NOTE) The Xpert Xpress SARS-CoV-2/FLU/RSV plus assay is intended as an aid in the diagnosis of influenza from Nasopharyngeal swab specimens and should not be used as a sole basis for treatment. Nasal washings and aspirates are unacceptable for Xpert Xpress SARS-CoV-2/FLU/RSV testing.  Fact Sheet for Patients: EntrepreneurPulse.com.au  Fact Sheet for Healthcare Providers: IncredibleEmployment.be  This test is not yet approved or cleared by the [place] and has been authorized for detection and/or diagnosis of SARS-CoV-2 by FDA under an Emergency Use Authorization (EUA). This EUA will remain in effect (meaning this test can be used) for the duration of the COVID-19 declaration under Section 564(b)(1) of the Act, 21 U.S.C. section 360bbb-3(b)(1), unless the authorization is terminated or revoked.  Performed at [facility], [address] [place], [place] [address]   Urine Culture     Status: Abnormal   Collection Time: 03/26/21  1:35 PM   Specimen: Urine, Random  Result Value Ref Range Status   Specimen Description   Final    URINE, RANDOM Performed at [facility], [address], [place], [place] [address]    Special Requests   Final    NONE Performed at [facility], [address], [place], [place] [address]    Culture (A)  Final    80,000 COLONIES/mL GROUP B STREP(S.AGALACTIAE)ISOLATED TESTING AGAINST S. AGALACTIAE NOT ROUTINELY PERFORMED DUE TO PREDICTABILITY OF AMP/PEN/VAN SUSCEPTIBILITY. Performed at [facility], [place] [address] [place], [name] [address]    Report Status 03/27/2021 FINAL   Final  Culture, blood (routine x 2)     Status: None   Collection Time: 03/26/21  3:06 PM   Specimen: BLOOD RIGHT FOREARM  Result Value Ref Range Status   Specimen Description   Final    BLOOD RIGHT FOREARM Performed at [facility], [address] [place], [place] [address]    Special Requests   Final    BOTTLES DRAWN AEROBIC AND ANAEROBIC Blood Culture results may not be optimal due to an excessive volume of blood received in culture bottles Performed at [facility], [address] [place], [place] [address]    Culture   Final    NO GROWTH 5 DAYS Performed at [facility], [place] [address] [place], [place] [address]    Report Status 03/31/2021 FINAL  Final  Culture, blood (routine x 2)     Status: Abnormal   Collection Time: 03/26/21  3:57 PM   Specimen: BLOOD  Result Value Ref Range Status   Specimen Description   Final    BLOOD RIGHT ANTECUBITAL Performed at [facility], [address], [place], [place] [address]    Special Requests   Final    BOTTLES DRAWN AEROBIC AND ANAEROBIC Blood Culture adequate volume Performed at [facility], [address] [place], [place] [address]    Culture  Setup Time   Final    GRAM POSITIVE RODS ANAEROBIC BOTTLE ONLY CRITICAL RESULT CALLED TO, READ BACK BY AND VERIFIED WITH: L POINDEXTER,[email or web address]  03/28/21 [place]    Culture (A)  Final    ACTINOMYCES NEUII Standardized susceptibility testing for this organism is not available. Performed at [facility], [place] [address] [place], [place] [address]    Report Status 03/31/2021 FINAL  Final     Labs: BNP (last 3 results) Recent Labs    11/17/20 1757  BNP 78.5   Basic Metabolic Panel: Recent Labs  Lab 03/26/21 1141 03/27/21 0048 03/28/21 0416 03/29/21 0351 03/31/21 0343  NA 133* 131* 132* 134* 138  K 3.7 3.3* 3.4* 3.5 4.1  CL 98 100 100 100 104  CO2 24 22 24 24 24   GLUCOSE 164* 133*  96 158* 198*  BUN 19 14 17 19  29*  CREATININE 0.80 0.65 0.65 0.89 0.82  CALCIUM  10.0 8.6* 8.8* 9.0 9.3   Liver Function Tests: Recent Labs  Lab 03/26/21 1141 03/27/21 0048 03/28/21 0416 03/31/21 0343  AST 29 23 33 28  ALT 92* 70* 57* 57*  ALKPHOS 66 53 46 51  BILITOT 0.5 0.5 0.6 0.4  PROT 7.1 6.2* 6.0* 5.8*  ALBUMIN 3.8 2.8* 2.8* 2.7*   No results for input(s): LIPASE, AMYLASE in the last 168 hours. Recent Labs  Lab 03/27/21 0338  AMMONIA 22   CBC: Recent Labs  Lab 03/26/21 1141 03/27/21 0048 03/28/21 0416 03/29/21 0351 03/31/21 0343  WBC 15.6* 14.4* 12.5* 11.3* 15.4*  NEUTROABS 11.1*  --   --   --   --   HGB 10.2* 9.1* 9.0* 9.4* 8.7*  HCT 30.5* 26.8* 27.1* 28.6* 26.8*  MCV 107.4* 109.8* 112.9* 113.5* 113.1*  PLT 405* 332 332 352 371   Cardiac Enzymes: No results for input(s): CKTOTAL, CKMB, CKMBINDEX, TROPONINI in the last 168 hours. BNP: Invalid input(s): POCBNP CBG: Recent Labs  Lab 03/30/21 1158 03/30/21 1644 03/30/21 2056 03/31/21 0725 03/31/21 1113  GLUCAP 189* 192* 198* 155* 200*   D-Dimer No results for input(s): DDIMER in the last 72 hours. Hgb A1c No results for input(s): HGBA1C in the last 72 hours. Lipid Profile No results for input(s): CHOL, HDL, LDLCALC, TRIG, CHOLHDL, LDLDIRECT in the last 72 hours. Thyroid function studies No results for input(s): TSH, T4TOTAL, T3FREE, THYROIDAB in the last 72 hours.  Invalid input(s): FREET3 Anemia work up No results for input(s): VITAMINB12, FOLATE, FERRITIN, TIBC, IRON, RETICCTPCT in the last 72 hours. Urinalysis    Component Value Date/Time   COLORURINE YELLOW 03/26/2021 1336   APPEARANCEUR HAZY (A) 03/26/2021 1336   LABSPEC 1.017 03/26/2021 1336   PHURINE 6.0 03/26/2021 1336   GLUCOSEU NEGATIVE 03/26/2021 1336   GLUCOSEU NEGATIVE 07/17/2020 0939   HGBUR NEGATIVE 03/26/2021 1336   BILIRUBINUR NEGATIVE 03/26/2021 1336   BILIRUBINUR 1+ 06/13/2019 1531   KETONESUR NEGATIVE 03/26/2021 1336    PROTEINUR 30 (A) 03/26/2021 1336   UROBILINOGEN 0.2 07/17/2020 0939   NITRITE NEGATIVE 03/26/2021 1336   LEUKOCYTESUR LARGE (A) 03/26/2021 1336   Sepsis Labs Invalid input(s): PROCALCITONIN,  WBC,  LACTICIDVEN Microbiology Recent Results (from the past 240 hour(s))  Resp Panel by RT-PCR (Flu A&B, Covid) Nasopharyngeal Swab     Status: Abnormal   Collection Time: 03/26/21 11:36 AM   Specimen: Nasopharyngeal Swab; Nasopharyngeal(NP) swabs in vial transport medium  Result Value Ref Range Status   SARS Coronavirus 2 by RT PCR POSITIVE (A) NEGATIVE Final    Comment: (NOTE) SARS-CoV-2 target nucleic acids are DETECTED.  The SARS-CoV-2 RNA is generally detectable in upper respiratory specimens during the acute phase of infection. Positive results are indicative of the presence of the identified virus, but do not rule out bacterial infection or co-infection with other pathogens not detected by the test. Clinical correlation with patient history and other diagnostic information is necessary to determine patient infection status. The expected result is Negative.  Fact Sheet for Patients: BloggerCourse.com  Fact Sheet for Healthcare Providers: SeriousBroker.it  This test is not yet approved or cleared by the [place] FDA and  has been authorized for detection and/or diagnosis of SARS-CoV-2 by FDA under an Emergency Use Authorization (EUA).  This EUA will remain in effect (meaning this test can be used) for the duration of  the COVID-19 declaration under Section 564(b)(1) of the A ct, 21 U.S.C. section 360bbb-3(b)(1), unless the authorization is terminated or revoked sooner.  Influenza A by PCR NEGATIVE NEGATIVE Final   Influenza B by PCR NEGATIVE NEGATIVE Final    Comment: (NOTE) The Xpert Xpress SARS-CoV-2/FLU/RSV plus assay is intended as an aid in the diagnosis of influenza from Nasopharyngeal swab specimens and should  not be used as a sole basis for treatment. Nasal washings and aspirates are unacceptable for Xpert Xpress SARS-CoV-2/FLU/RSV testing.  Fact Sheet for Patients: EntrepreneurPulse.com.au  Fact Sheet for Healthcare Providers: IncredibleEmployment.be  This test is not yet approved or cleared by the [place] FDA and has been authorized for detection and/or diagnosis of SARS-CoV-2 by FDA under an Emergency Use Authorization (EUA). This EUA will remain in effect (meaning this test can be used) for the duration of the COVID-19 declaration under Section 564(b)(1) of the Act, 21 U.S.C. section 360bbb-3(b)(1), unless the authorization is terminated or revoked.  Performed at [facility], [address], [place], [place] [address]   Urine Culture     Status: Abnormal   Collection Time: 03/26/21  1:35 PM   Specimen: Urine, Random  Result Value Ref Range Status   Specimen Description   Final    URINE, RANDOM Performed at [facility], [address], [place], [place] [address]    Special Requests   Final    NONE Performed at [facility], [address] [place], [place] [address]    Culture (A)  Final    80,000 COLONIES/mL GROUP B STREP(S.AGALACTIAE)ISOLATED TESTING AGAINST S. AGALACTIAE NOT ROUTINELY PERFORMED DUE TO PREDICTABILITY OF AMP/PEN/VAN SUSCEPTIBILITY. Performed at [facility], [place] [address], [place], [place] [address]    Report Status 03/27/2021 FINAL  Final  Culture, blood (routine x 2)     Status: None   Collection Time: 03/26/21  3:06 PM   Specimen: BLOOD RIGHT FOREARM  Result Value Ref Range Status   Specimen Description   Final    BLOOD RIGHT FOREARM Performed at [facility], [address], [place], [place] [address]    Special Requests   Final    BOTTLES DRAWN AEROBIC AND ANAEROBIC Blood Culture results may not be optimal due to an  excessive volume of blood received in culture bottles Performed at [facility], [address] [place], [place] [address]    Culture   Final    NO GROWTH 5 DAYS Performed at [facility], [place] [address], [place], [place] [address]    Report Status 03/31/2021 FINAL  Final  Culture, blood (routine x 2)     Status: Abnormal   Collection Time: 03/26/21  3:57 PM   Specimen: BLOOD  Result Value Ref Range Status   Specimen Description   Final    BLOOD RIGHT ANTECUBITAL Performed at [facility], [address] [place], [place] [address]    Special Requests   Final    BOTTLES DRAWN AEROBIC AND ANAEROBIC Blood Culture adequate volume Performed at [facility], [address], [place], [place] [address]    Culture  Setup Time   Final    GRAM POSITIVE RODS ANAEROBIC BOTTLE ONLY CRITICAL RESULT CALLED TO, READ BACK BY AND VERIFIED WITH: L POINDEXTER,[email or web address]  03/28/21 [place]    Culture (A)  Final    ACTINOMYCES NEUII Standardized susceptibility testing for this organism is not available. Performed at [facility], [place] [address], [place],  [address]    Report Status 03/31/2021 FINAL  Final     Time coordinating discharge: 77mins  SIGNED:  Kathie Dike, MD  [facility] 03/31/2021, 11:30 PM   If 7PM-7AM, please contact night-coverage [email or web address]

## 2021-03-31 NOTE — Progress Notes (Unsigned)
I was informed by pharmacy that after patient discharge, she was noted to have 1 out of 2 positive blood culture for actinomyces. This had been reviewed by pharmacy with infectious Disease Dr. Juleen China who recommended 2 weeks of doxycycline. Follow up with infectious disease clinic will be arranged. I have e-prescribed prescription for doxycycline to the pharmacy on file.  Pharmacist will contact patient's daughter to update her on plan.

## 2021-03-31 NOTE — Progress Notes (Signed)
ID Follow-up: Positive blood culture for Actinomyces Neuii  29 YOM who presented on 1/5 with fevers and treated inpatient for UTI/COVID - now with 1 of 4 blood cultures growing GPR with species identified as Actinomyces neuii.  Discussed with the ID-MD, Jule Ser, and it was recommend to start her on Doxycycline 100 mg po bid for now (Amoxicillin preferred but noted PCN allergy) until she can be seen in the ID clinic.   Was able to reach patient's daughter, [name], to make her aware of the positive blood culture, need for antibiotics, and ID clinic follow-up.   Plan - Discharging MD ([facility]) to call in script for Doxycycline 100 mg po bid x 2 weeks - ID team will make clinic appointment for her to follow-up - Daughter [name]) aware to pick up prescription and will contact her mother's care team to inform.   Thank you for allowing pharmacy to be a part of this patients care.  Alycia Rossetti, PharmD, BCPS Clinical Pharmacist 03/31/2021 5:04 PM   **Pharmacist phone directory can now be found on [email or web address] (PW [id number]).  Listed under [place].

## 2021-04-01 ENCOUNTER — Telehealth: Payer: Self-pay

## 2021-04-01 NOTE — Telephone Encounter (Signed)
Spoke with patient's daughter, [name], provided her with appointment information.   Relayed per Dr. Juleen China that patient should be taking the doxycycline.   Beryle Flock, RN

## 2021-04-01 NOTE — Telephone Encounter (Addendum)
Transition Care Management Unsuccessful Follow-up Telephone Call  Date of discharge and from where:  [facility] 03-31-21 Dx: COVID  Attempts:  1st Attempt  Reason for unsuccessful TCM follow-up call:  Left voice message  Transition Care Management Unsuccessful Follow-up Telephone Call  Date of discharge and from where:  [facility] 03-31-21 Dx: COVID  Attempts:  2nd Attempt  Reason for unsuccessful TCM follow-up call:  Left voice message  Transition Care Management Unsuccessful Follow-up Telephone Call  Date of discharge and from where:  [facility] 03-31-21 Dx: COVID  Attempts:  3rd attempt  Reason for unsuccessful TCM follow-up call:  Left voice message- if pt calls back ok to schedule TCM fu appt

## 2021-04-01 NOTE — Telephone Encounter (Incomplete Revision)
Transition Care Management Unsuccessful Follow-up Telephone Call  Date of discharge and from where:  [facility] 03-31-21 Dx: COVID  Attempts:  1st Attempt  Reason for unsuccessful TCM follow-up call:  Left voice message  Transition Care Management Unsuccessful Follow-up Telephone Call  Date of discharge and from where:  [facility] 03-31-21 Dx: COVID  Attempts:  2nd Attempt  Reason for unsuccessful TCM follow-up call:  Left voice message

## 2021-04-03 ENCOUNTER — Telehealth (INDEPENDENT_AMBULATORY_CARE_PROVIDER_SITE_OTHER): Payer: Medicare Other | Admitting: Internal Medicine

## 2021-04-03 ENCOUNTER — Other Ambulatory Visit: Payer: Self-pay

## 2021-04-03 ENCOUNTER — Encounter: Payer: Self-pay | Admitting: Internal Medicine

## 2021-04-03 DIAGNOSIS — R7881 Bacteremia: Secondary | ICD-10-CM | POA: Diagnosis not present

## 2021-04-03 NOTE — Progress Notes (Signed)
[facility] for Infectious Disease   I connected with  Kayla Thompson on 04/03/21 by telephone and verified that I am speaking with the correct person using two identifiers.   I discussed the limitations of evaluation and management by telemedicine. The patient expressed understanding and agreed to proceed.  Location: Patient - home Physician - clinic  Duration of visit:  17 minutes   Reason for Consult:positive blood culture    Referring Physician: Dr. Roderic Palau    Patient ID: Kayla Thompson, female    DOB: [birth year], 86 y.o.   MRN: [id number]  HPI:   She is called as a new patient with a recent history of a positive blood culture with Actinomyces neuii in 1/4 blood culture bottles.  She has presented with fever and concern for UTI and also had mildly symptomatic COVID-19 infection.  She was treated with ceftriaxone and given 14 days of doxycycline for home once the blood culture grew out.  At this time, she is having no fever, no chills and denies any skin abscess, boils or redness anywhere.     Past Medical History:  Diagnosis Date   Anxiety    Arthritis    Asthma    Cataracts, bilateral    removed by surgery   COPD (chronic obstructive pulmonary disease) ([place])    Depression    Diabetes mellitus without complication ([place])    type 2   Dyspnea    with exertion   Fibromyalgia    GERD (gastroesophageal reflux disease)    History of chemotherapy    History of fractured pelvis    Hypertension    Hypothyroidism    Osteoporosis 12/2017   T score -2.8 stable from prior DEXA   Osteoporosis    Ovarian cancer ([place])    Pneumonia    several times    Prior to Admission medications   Medication Sig Start Date End Date Taking? Authorizing Provider  acetaminophen (TYLENOL) 650 MG CR tablet Take 1 tablet (650 mg total) by mouth See admin instructions. Take 1,300 mg by mouth every 4-5 hours IN CONJUNCTION WITH one Percocet 7.5/325 tablet- for pain 03/31/21  Yes Kathie Dike,  MD  albuterol (PROAIR HFA) 108 (90 Base) MCG/ACT inhaler Inhale 2 puffs into the lungs every 6 (six) hours as needed for wheezing or shortness of breath. 08/27/19  Yes Young, Clinton D, MD  Apoaequorin (PREVAGEN PO) Take 1 tablet by mouth every evening.   Yes [provider]  Ascorbic Acid (VITAMIN C) 1000 MG tablet Take 1,000 mg by mouth in the morning and at bedtime.   Yes [provider]  baclofen (LIORESAL) 10 MG tablet TAKE 0.5-1 TABLET BY MOUTH TWICE DAILY AS NEEDED FOR MUSCLE SPASMS OR PAIN 02/09/21  Yes [name], MD  BD PEN NEEDLE NANO 2ND GEN 32G X 4 MM MISC USE AS DIRECTED 4 TIMES A DAY 10/22/20  Yes [name], MD  busPIRone (BUSPAR) 5 MG tablet Take 5-10 mg by mouth daily as needed (for anxiety).   Yes [provider]  Calcium Carbonate (CALTRATE 600 PO) Take 600 mg by mouth in the morning and at bedtime.   Yes [provider]  cholecalciferol (VITAMIN D3) 25 MCG (1000 UNIT) tablet Take 2,000 Units by mouth in the morning.   Yes [provider]  Cyanocobalamin (VITAMIN B 12 PO) Take 2,500 mcg by mouth every Thursday.   Yes [provider]  denosumab (PROLIA) 60 MG/ML SOSY injection Inject 60  mg into the skin every 6 (six) months.   Yes [provider]  Dextromethorphan-guaiFENesin (MUCINEX DM MAXIMUM STRENGTH) 60-1200 MG TB12 Take 1 tablet by mouth in the morning and at bedtime.   Yes [provider]  docusate sodium (COLACE) 100 MG capsule Take 1 capsule (100 mg total) by mouth daily. 01/28/21  Yes [name], MD  doxycycline (VIBRAMYCIN) 100 MG capsule Take 1 capsule (100 mg total) by mouth 2 (two) times daily. 03/31/21  Yes Kathie Dike, MD  DULoxetine (CYMBALTA) 30 MG capsule Take 1 capsule (30 mg total) by mouth daily. Patient taking differently: Take 30 mg by mouth in the morning. 05/19/20  Yes [name], MD  DULoxetine (CYMBALTA) 60 MG capsule Take 1 capsule (60 mg  total) by mouth daily. Patient taking differently: Take 60 mg by mouth in the morning. 05/19/20  Yes [name], MD  estradiol (ESTRACE) 0.1 MG/GM vaginal cream Place 1 Applicatorful vaginally See admin instructions. Takes 2 times a week 06/28/20  Yes [provider]  feeding supplement (ENSURE ENLIVE / ENSURE PLUS) LIQD Take 237 mLs by mouth 2 (two) times daily between meals. Patient taking differently: Take 237 mLs by mouth 3 (three) times daily with meals. 01/16/20  Yes Hongalgi, Lenis Dickinson, MD  gabapentin (NEURONTIN) 100 MG capsule Take 100 mg by mouth at bedtime. 11/03/20  Yes [provider]  glucose blood (FREESTYLE LITE) test strip USE TO TEST BLOOD SUGAR 3 TIMES DAILY. 09/19/20  Yes Philemon Kingdom, MD  insulin glargine (LANTUS) 100 UNIT/ML Solostar Pen Inject 5 Units into the skin in the morning.   Yes [provider]  insulin lispro (HUMALOG KWIKPEN) 100 UNIT/ML KwikPen Inject 2-4 Units into the skin daily before supper. Patient taking differently: Inject 2 Units into the skin See admin instructions. Inject 2 units into the skin before meals as needed for a low BGL 12/18/20  Yes Philemon Kingdom, MD  ipratropium-albuterol (DUONEB) 0.5-2.5 (3) MG/3ML SOLN Inhale 3 mLs into the lungs every 4 (four) hours as needed. 01/19/21  Yes Young, Tarri Fuller D, MD  Lancets (FREESTYLE) lancets Use to test blood sugar 3 times daily. Dx: E11.65 10/14/16  Yes Philemon Kingdom, MD  levothyroxine (SYNTHROID) 50 MCG tablet TAKE 1 TABLET BY MOUTH EVERY DAY BEFORE BREAKFAST 03/25/21  Yes [name], MD  linaclotide [facility]) 72 MCG capsule Take 1 capsule (72 mcg total) by mouth daily before breakfast. 03/11/21  Yes Biagio Borg, MD  methenamine (MANDELAMINE) 1 g tablet Take 1,000 mg by mouth in the morning and at bedtime.   Yes [provider]  mirtazapine (REMERON) 15 MG tablet TAKE 1 TABLET BY MOUTH EVERYDAY AT BEDTIME 03/20/21  Yes [name], MD  Misc  Natural Products (TART CHERRY ADVANCED PO) Take 1,000 mg by mouth every evening.   Yes [provider]  montelukast (SINGULAIR) 10 MG tablet TAKE 1 TABLET BY MOUTH EVERY DAY 02/07/21  Yes Young, Clinton D, MD  naloxone [facility]) nasal spray 4 mg/0.1 mL Place 1 spray into the nose once as needed (overdose). 06/27/20  Yes [provider]  nitrofurantoin, macrocrystal-monohydrate, (MACROBID) 100 MG capsule Take 100 mg by mouth daily. 11/15/20  Yes [provider]  omeprazole (PRILOSEC) 20 MG capsule TAKE 1 CAPSULE TWICE A DAY BEFORE A MEAL Patient taking differently: Take 20 mg by mouth 2 (two) times daily before a meal. 10/03/20  Yes Mansouraty, Telford Nab., MD  oxyCODONE-acetaminophen (PERCOCET) 7.5-325 MG tablet Take 1 tablet by  mouth See admin instructions. Take 1 tablet by mouth every 4-5 hours (IN CONJUNCTION WITH 1,300 mg of Tylenol) 10/23/20  Yes [provider]  polyethylene glycol (MIRALAX / GLYCOLAX) 17 g packet Take 17 g by mouth daily.   Yes [provider]  predniSONE (DELTASONE) 1 MG tablet Take 1 tablet (1 mg total) by mouth in the morning and at bedtime. 03/31/21  Yes Kathie Dike, MD  rosuvastatin (CRESTOR) 10 MG tablet Take 1 tablet (10 mg total) by mouth every evening. 05/19/20  Yes [name], MD  TRELEGY ELLIPTA 100-62.5-25 MCG/INH AEPB INHALE 1 PUFF INTO THE LUNGS DAILY. RINSE MOUTH Patient taking differently: Inhale 1 puff into the lungs in the morning. Rinse mouth 07/05/20  Yes Young, Tarri Fuller D, MD  TURMERIC PO Take 1,000 mg by mouth in the morning.   Yes [provider]    Allergies  Allergen Reactions   Tape Other (See Comments)    SKIN IS VERY THIN AND DELICATE- will tear like paper!!   Lasix [Furosemide] Other (See Comments)    Confusion- MD stopped this med   Latex Rash   Penicillins Rash    Has patient had a PCN reaction causing immediate rash, facial/tongue/throat swelling, SOB or lightheadedness with  hypotension: No Has patient had a PCN reaction causing severe rash involving mucus membranes or skin necrosis: No Has patient had a PCN reaction that required hospitalization: No Has patient had a PCN reaction occurring within the last 10 years: No If all of the above answers are "NO", then may proceed with Cephalosporin use.     Social History   Tobacco Use   Smoking status: Never    Passive exposure: Never   Smokeless tobacco: Never  Vaping Use   Vaping Use: Never used  Substance Use Topics   Alcohol use: No    Alcohol/week: 0.0 standard drinks   Drug use: No    Family History  Problem Relation Age of Onset   Lung cancer Mother    CAD Father    Diabetes Father    Lung cancer Maternal Aunt    Diabetes Paternal Aunt    Diabetes Paternal Uncle    Diabetes Paternal Grandmother    Diabetes Paternal Grandfather    Colon cancer Neg Hx    Esophageal cancer Neg Hx    Inflammatory bowel disease Neg Hx    Liver disease Neg Hx    Pancreatic cancer Neg Hx    Rectal cancer Neg Hx    Stomach cancer Neg Hx     Review of Systems  Constitutional: negative for fevers, chills, sweats, and malaise Gastrointestinal: negative for nausea and diarrhea Integument/breast: negative for rash and skin lesion(s) All other systems reviewed and are negative    Constitutional: in no apparent distress  Respiratory: normal respiratory effort  Labs: Lab Results  Component Value Date   WBC 15.4 (H) 03/31/2021   HGB 8.7 (L) 03/31/2021   HCT 26.8 (L) 03/31/2021   MCV 113.1 (H) 03/31/2021   PLT 371 03/31/2021    Lab Results  Component Value Date   CREATININE 0.82 03/31/2021   BUN 29 (H) 03/31/2021   NA 138 03/31/2021   K 4.1 03/31/2021   CL 104 03/31/2021   CO2 24 03/31/2021    Lab Results  Component Value Date   ALT 57 (H) 03/31/2021   AST 28 03/31/2021   ALKPHOS 51 03/31/2021   BILITOT 0.4 03/31/2021   INR 1.0 07/07/2020     Assessment:  positive blood culture.  No current or  new concerns for infection on history so no further antibiotics indicated.  She reports taking the doxyxycline.  Discussed return precautions, concern for skin infection.  Nothing evident.   Plan: 1)  observe off of antibiotics.

## 2021-04-08 ENCOUNTER — Telehealth: Payer: Self-pay

## 2021-04-08 DIAGNOSIS — G8929 Other chronic pain: Secondary | ICD-10-CM | POA: Diagnosis not present

## 2021-04-08 DIAGNOSIS — I5032 Chronic diastolic (congestive) heart failure: Secondary | ICD-10-CM | POA: Diagnosis not present

## 2021-04-08 DIAGNOSIS — E1151 Type 2 diabetes mellitus with diabetic peripheral angiopathy without gangrene: Secondary | ICD-10-CM | POA: Diagnosis not present

## 2021-04-08 DIAGNOSIS — R569 Unspecified convulsions: Secondary | ICD-10-CM | POA: Diagnosis not present

## 2021-04-08 DIAGNOSIS — Z981 Arthrodesis status: Secondary | ICD-10-CM | POA: Diagnosis not present

## 2021-04-08 DIAGNOSIS — K219 Gastro-esophageal reflux disease without esophagitis: Secondary | ICD-10-CM | POA: Diagnosis not present

## 2021-04-08 DIAGNOSIS — G9341 Metabolic encephalopathy: Secondary | ICD-10-CM | POA: Diagnosis not present

## 2021-04-08 DIAGNOSIS — M19012 Primary osteoarthritis, left shoulder: Secondary | ICD-10-CM | POA: Diagnosis not present

## 2021-04-08 DIAGNOSIS — Z8543 Personal history of malignant neoplasm of ovary: Secondary | ICD-10-CM | POA: Diagnosis not present

## 2021-04-08 DIAGNOSIS — E785 Hyperlipidemia, unspecified: Secondary | ICD-10-CM | POA: Diagnosis not present

## 2021-04-08 DIAGNOSIS — Z96649 Presence of unspecified artificial hip joint: Secondary | ICD-10-CM | POA: Diagnosis not present

## 2021-04-08 DIAGNOSIS — M810 Age-related osteoporosis without current pathological fracture: Secondary | ICD-10-CM | POA: Diagnosis not present

## 2021-04-08 DIAGNOSIS — M797 Fibromyalgia: Secondary | ICD-10-CM | POA: Diagnosis not present

## 2021-04-08 DIAGNOSIS — A401 Sepsis due to streptococcus, group B: Secondary | ICD-10-CM | POA: Diagnosis not present

## 2021-04-08 DIAGNOSIS — E039 Hypothyroidism, unspecified: Secondary | ICD-10-CM | POA: Diagnosis not present

## 2021-04-08 DIAGNOSIS — J45909 Unspecified asthma, uncomplicated: Secondary | ICD-10-CM | POA: Diagnosis not present

## 2021-04-08 DIAGNOSIS — U071 COVID-19: Secondary | ICD-10-CM | POA: Diagnosis not present

## 2021-04-08 DIAGNOSIS — F419 Anxiety disorder, unspecified: Secondary | ICD-10-CM | POA: Diagnosis not present

## 2021-04-08 DIAGNOSIS — M353 Polymyalgia rheumatica: Secondary | ICD-10-CM | POA: Diagnosis not present

## 2021-04-08 DIAGNOSIS — K5909 Other constipation: Secondary | ICD-10-CM | POA: Diagnosis not present

## 2021-04-08 DIAGNOSIS — M19011 Primary osteoarthritis, right shoulder: Secondary | ICD-10-CM | POA: Diagnosis not present

## 2021-04-08 DIAGNOSIS — I110 Hypertensive heart disease with heart failure: Secondary | ICD-10-CM | POA: Diagnosis not present

## 2021-04-08 DIAGNOSIS — Z96659 Presence of unspecified artificial knee joint: Secondary | ICD-10-CM | POA: Diagnosis not present

## 2021-04-08 DIAGNOSIS — F32A Depression, unspecified: Secondary | ICD-10-CM | POA: Diagnosis not present

## 2021-04-08 DIAGNOSIS — N390 Urinary tract infection, site not specified: Secondary | ICD-10-CM | POA: Diagnosis not present

## 2021-04-08 NOTE — Telephone Encounter (Signed)
Patient daughter [name] called wanting to ask if dental pain or abscess could be the cause of the infection. Patient was complaining of dental pain prior to be admitting in the hospital but had to reschedule dental appointment due to being in the hospital. I advised [name] that the patient should see a dentist as soon as possible due to the dental pain but I would send a message back to the providers. Patient is scheduled with Dr.Singh on Monday - Rhonda requested her mother be seen before d/c of Doxycycline.    [place], CMA

## 2021-04-09 DIAGNOSIS — R030 Elevated blood-pressure reading, without diagnosis of hypertension: Secondary | ICD-10-CM | POA: Diagnosis not present

## 2021-04-09 DIAGNOSIS — Z09 Encounter for follow-up examination after completed treatment for conditions other than malignant neoplasm: Secondary | ICD-10-CM | POA: Diagnosis not present

## 2021-04-09 DIAGNOSIS — N183 Chronic kidney disease, stage 3 unspecified: Secondary | ICD-10-CM | POA: Diagnosis not present

## 2021-04-09 DIAGNOSIS — R0602 Shortness of breath: Secondary | ICD-10-CM | POA: Diagnosis not present

## 2021-04-09 DIAGNOSIS — Z79899 Other long term (current) drug therapy: Secondary | ICD-10-CM | POA: Diagnosis not present

## 2021-04-09 DIAGNOSIS — M545 Low back pain, unspecified: Secondary | ICD-10-CM | POA: Diagnosis not present

## 2021-04-09 DIAGNOSIS — E119 Type 2 diabetes mellitus without complications: Secondary | ICD-10-CM | POA: Diagnosis not present

## 2021-04-11 ENCOUNTER — Emergency Department (HOSPITAL_BASED_OUTPATIENT_CLINIC_OR_DEPARTMENT_OTHER): Payer: Medicare Other

## 2021-04-11 ENCOUNTER — Encounter (HOSPITAL_BASED_OUTPATIENT_CLINIC_OR_DEPARTMENT_OTHER): Payer: Self-pay

## 2021-04-11 ENCOUNTER — Inpatient Hospital Stay (HOSPITAL_COMMUNITY): Payer: Medicare Other

## 2021-04-11 ENCOUNTER — Inpatient Hospital Stay (HOSPITAL_BASED_OUTPATIENT_CLINIC_OR_DEPARTMENT_OTHER)
Admission: EM | Admit: 2021-04-11 | Discharge: 2021-04-14 | DRG: 871 | Disposition: A | Discharge status: Home Health | Payer: Medicare Other | Attending: Family Medicine | Admitting: Family Medicine

## 2021-04-11 ENCOUNTER — Other Ambulatory Visit: Payer: Self-pay

## 2021-04-11 DIAGNOSIS — K5641 Fecal impaction: Secondary | ICD-10-CM

## 2021-04-11 DIAGNOSIS — A429 Actinomycosis, unspecified: Secondary | ICD-10-CM | POA: Diagnosis present

## 2021-04-11 DIAGNOSIS — I110 Hypertensive heart disease with heart failure: Secondary | ICD-10-CM | POA: Diagnosis not present

## 2021-04-11 DIAGNOSIS — N3090 Cystitis, unspecified without hematuria: Secondary | ICD-10-CM | POA: Diagnosis not present

## 2021-04-11 DIAGNOSIS — Z0389 Encounter for observation for other suspected diseases and conditions ruled out: Secondary | ICD-10-CM | POA: Diagnosis not present

## 2021-04-11 DIAGNOSIS — Z833 Family history of diabetes mellitus: Secondary | ICD-10-CM

## 2021-04-11 DIAGNOSIS — Z8543 Personal history of malignant neoplasm of ovary: Secondary | ICD-10-CM

## 2021-04-11 DIAGNOSIS — Z7189 Other specified counseling: Secondary | ICD-10-CM

## 2021-04-11 DIAGNOSIS — M353 Polymyalgia rheumatica: Secondary | ICD-10-CM | POA: Diagnosis present

## 2021-04-11 DIAGNOSIS — I5032 Chronic diastolic (congestive) heart failure: Secondary | ICD-10-CM | POA: Diagnosis present

## 2021-04-11 DIAGNOSIS — Z880 Allergy status to penicillin: Secondary | ICD-10-CM | POA: Diagnosis not present

## 2021-04-11 DIAGNOSIS — Z794 Long term (current) use of insulin: Secondary | ICD-10-CM

## 2021-04-11 DIAGNOSIS — R000 Tachycardia, unspecified: Secondary | ICD-10-CM | POA: Diagnosis not present

## 2021-04-11 DIAGNOSIS — M797 Fibromyalgia: Secondary | ICD-10-CM | POA: Diagnosis present

## 2021-04-11 DIAGNOSIS — K5900 Constipation, unspecified: Secondary | ICD-10-CM | POA: Diagnosis not present

## 2021-04-11 DIAGNOSIS — U071 COVID-19: Secondary | ICD-10-CM | POA: Diagnosis present

## 2021-04-11 DIAGNOSIS — E785 Hyperlipidemia, unspecified: Secondary | ICD-10-CM | POA: Diagnosis present

## 2021-04-11 DIAGNOSIS — Z9841 Cataract extraction status, right eye: Secondary | ICD-10-CM

## 2021-04-11 DIAGNOSIS — D72829 Elevated white blood cell count, unspecified: Secondary | ICD-10-CM | POA: Diagnosis present

## 2021-04-11 DIAGNOSIS — E039 Hypothyroidism, unspecified: Secondary | ICD-10-CM | POA: Diagnosis present

## 2021-04-11 DIAGNOSIS — E876 Hypokalemia: Secondary | ICD-10-CM | POA: Diagnosis not present

## 2021-04-11 DIAGNOSIS — E119 Type 2 diabetes mellitus without complications: Secondary | ICD-10-CM | POA: Diagnosis not present

## 2021-04-11 DIAGNOSIS — J449 Chronic obstructive pulmonary disease, unspecified: Secondary | ICD-10-CM | POA: Diagnosis not present

## 2021-04-11 DIAGNOSIS — B3741 Candidal cystitis and urethritis: Secondary | ICD-10-CM | POA: Diagnosis present

## 2021-04-11 DIAGNOSIS — R7881 Bacteremia: Secondary | ICD-10-CM | POA: Diagnosis present

## 2021-04-11 DIAGNOSIS — R4182 Altered mental status, unspecified: Secondary | ICD-10-CM | POA: Diagnosis not present

## 2021-04-11 DIAGNOSIS — A419 Sepsis, unspecified organism: Secondary | ICD-10-CM | POA: Diagnosis not present

## 2021-04-11 DIAGNOSIS — N390 Urinary tract infection, site not specified: Secondary | ICD-10-CM | POA: Diagnosis not present

## 2021-04-11 DIAGNOSIS — Z801 Family history of malignant neoplasm of trachea, bronchus and lung: Secondary | ICD-10-CM

## 2021-04-11 DIAGNOSIS — K219 Gastro-esophageal reflux disease without esophagitis: Secondary | ICD-10-CM | POA: Diagnosis present

## 2021-04-11 DIAGNOSIS — Z9221 Personal history of antineoplastic chemotherapy: Secondary | ICD-10-CM

## 2021-04-11 DIAGNOSIS — Z9842 Cataract extraction status, left eye: Secondary | ICD-10-CM

## 2021-04-11 DIAGNOSIS — Z91048 Other nonmedicinal substance allergy status: Secondary | ICD-10-CM

## 2021-04-11 DIAGNOSIS — Z8249 Family history of ischemic heart disease and other diseases of the circulatory system: Secondary | ICD-10-CM

## 2021-04-11 DIAGNOSIS — G9341 Metabolic encephalopathy: Secondary | ICD-10-CM | POA: Diagnosis present

## 2021-04-11 DIAGNOSIS — R21 Rash and other nonspecific skin eruption: Secondary | ICD-10-CM | POA: Diagnosis present

## 2021-04-11 DIAGNOSIS — Z79899 Other long term (current) drug therapy: Secondary | ICD-10-CM

## 2021-04-11 DIAGNOSIS — R413 Other amnesia: Secondary | ICD-10-CM | POA: Diagnosis present

## 2021-04-11 DIAGNOSIS — E860 Dehydration: Secondary | ICD-10-CM | POA: Diagnosis present

## 2021-04-11 DIAGNOSIS — Z91040 Latex allergy status: Secondary | ICD-10-CM

## 2021-04-11 DIAGNOSIS — N3000 Acute cystitis without hematuria: Secondary | ICD-10-CM | POA: Diagnosis not present

## 2021-04-11 DIAGNOSIS — Z7952 Long term (current) use of systemic steroids: Secondary | ICD-10-CM

## 2021-04-11 DIAGNOSIS — Z66 Do not resuscitate: Secondary | ICD-10-CM | POA: Diagnosis present

## 2021-04-11 DIAGNOSIS — I700 Atherosclerosis of aorta: Secondary | ICD-10-CM | POA: Diagnosis not present

## 2021-04-11 LAB — COMPREHENSIVE METABOLIC PANEL
ALT: 12 U/L (ref 0–44)
AST: 14 U/L — ABNORMAL LOW (ref 15–41)
Albumin: 2.8 g/dL — ABNORMAL LOW (ref 3.5–5.0)
Alkaline Phosphatase: 33 U/L — ABNORMAL LOW (ref 38–126)
Anion gap: 10 (ref 5–15)
BUN: 33 mg/dL — ABNORMAL HIGH (ref 8–23)
CO2: 18 mmol/L — ABNORMAL LOW (ref 22–32)
Calcium: 8.2 mg/dL — ABNORMAL LOW (ref 8.9–10.3)
Chloride: 103 mmol/L (ref 98–111)
Creatinine, Ser: 0.64 mg/dL (ref 0.44–1.00)
GFR, Estimated: >60 mL/min (ref >60)
Glucose, Bld: 144 mg/dL — ABNORMAL HIGH (ref 70–99)
Potassium: 3.4 mmol/L — ABNORMAL LOW (ref 3.5–5.1)
Sodium: 131 mmol/L — ABNORMAL LOW (ref 135–145)
Total Bilirubin: 0.4 mg/dL (ref 0.3–1.2)
Total Protein: 5.1 g/dL — ABNORMAL LOW (ref 6.5–8.1)

## 2021-04-11 LAB — CBC
HCT: 31.2 % — ABNORMAL LOW (ref 36.0–46.0)
Hemoglobin: 10.4 g/dL — ABNORMAL LOW (ref 12.0–15.0)
MCH: 36.2 pg — ABNORMAL HIGH (ref 26.0–34.0)
MCHC: 33.3 g/dL (ref 30.0–36.0)
MCV: 108.7 fL — ABNORMAL HIGH (ref 80.0–100.0)
Platelets: 445 10*3/uL — ABNORMAL HIGH (ref 150–400)
RBC: 2.87 MIL/uL — ABNORMAL LOW (ref 3.87–5.11)
RDW: 14.5 % (ref 11.5–15.5)
WBC: 20 10*3/uL — ABNORMAL HIGH (ref 4.0–10.5)
nRBC: 0 % (ref 0.0–0.2)

## 2021-04-11 LAB — PROTIME-INR
INR: 1 (ref 0.8–1.2)
Prothrombin Time: 12.9 seconds (ref 11.4–15.2)

## 2021-04-11 LAB — RESP PANEL BY RT-PCR (FLU A&B, COVID) ARPGX2
Influenza A by PCR: NEGATIVE
Influenza B by PCR: NEGATIVE
SARS Coronavirus 2 by RT PCR: POSITIVE — AB

## 2021-04-11 LAB — URINALYSIS, ROUTINE W REFLEX MICROSCOPIC
Bilirubin Urine: NEGATIVE
Glucose, UA: NEGATIVE mg/dL
Ketones, ur: NEGATIVE mg/dL
Nitrite: NEGATIVE
Protein, ur: 30 mg/dL — AB
RBC / HPF: >50 RBC/hpf — ABNORMAL HIGH (ref 0–5)
Specific Gravity, Urine: 1.024 (ref 1.005–1.030)
WBC, UA: >50 WBC/hpf — ABNORMAL HIGH (ref 0–5)
pH: 6 (ref 5.0–8.0)

## 2021-04-11 LAB — GLUCOSE, CAPILLARY: Glucose-Capillary: 162 mg/dL — ABNORMAL HIGH (ref 70–99)

## 2021-04-11 LAB — LACTIC ACID, PLASMA
Lactic Acid, Venous: 1.4 mmol/L (ref 0.5–1.9)
Lactic Acid, Venous: 1.4 mmol/L (ref 0.5–1.9)

## 2021-04-11 LAB — LIPASE, BLOOD: Lipase: 10 U/L — ABNORMAL LOW (ref 11–51)

## 2021-04-11 LAB — APTT: aPTT: 29 seconds (ref 24–36)

## 2021-04-11 MED ORDER — LACTATED RINGERS IV SOLN: INTRAVENOUS | Status: DC

## 2021-04-11 MED ORDER — LACTATED RINGERS IV BOLUS (SEPSIS)
1000.0000 mL | Freq: Once | INTRAVENOUS | Status: AC
  Administered 2021-04-11: 1000 mL via INTRAVENOUS

## 2021-04-11 MED ORDER — LEVOTHYROXINE SODIUM 50 MCG PO TABS
50.0000 ug | ORAL_TABLET | Freq: Every day | ORAL | Status: DC
  Administered 2021-04-12 – 2021-04-14 (×3): 50 ug via ORAL
  Filled 2021-04-11 (×3): qty 1

## 2021-04-11 MED ORDER — LACTATED RINGERS IV SOLN: INTRAVENOUS | Status: AC

## 2021-04-11 MED ORDER — IOHEXOL 300 MG/ML  SOLN
100.0000 mL | Freq: Once | INTRAMUSCULAR | Status: AC | PRN
  Administered 2021-04-11: 80 mL via INTRAVENOUS

## 2021-04-11 MED ORDER — PANTOPRAZOLE SODIUM 40 MG PO TBEC
40.0000 mg | DELAYED_RELEASE_TABLET | Freq: Every day | ORAL | Status: DC
  Administered 2021-04-12 – 2021-04-14 (×3): 40 mg via ORAL
  Filled 2021-04-11 (×3): qty 1

## 2021-04-11 MED ORDER — SODIUM CHLORIDE 0.9 % IV SOLN
2.0000 g | Freq: Once | INTRAVENOUS | Status: AC
  Administered 2021-04-11: 2 g via INTRAVENOUS
  Filled 2021-04-11: qty 2

## 2021-04-11 MED ORDER — LACTATED RINGERS IV BOLUS (SEPSIS)
500.0000 mL | Freq: Once | INTRAVENOUS | Status: AC
  Administered 2021-04-11: 500 mL via INTRAVENOUS

## 2021-04-11 MED ORDER — APOAEQUORIN 10 MG PO CAPS: ORAL_CAPSULE | Freq: Every evening | ORAL | Status: DC

## 2021-04-11 MED ORDER — INSULIN GLARGINE-YFGN 100 UNIT/ML ~~LOC~~ SOLN
5.0000 [IU] | Freq: Every morning | SUBCUTANEOUS | Status: DC
  Administered 2021-04-12 – 2021-04-14 (×3): 5 [IU] via SUBCUTANEOUS
  Filled 2021-04-11 (×5): qty 0.05

## 2021-04-11 MED ORDER — SODIUM CHLORIDE 0.9 % IV SOLN
2.0000 g | INTRAVENOUS | Status: DC
  Filled 2021-04-11: qty 2

## 2021-04-11 MED ORDER — FLUTICASONE-UMECLIDIN-VILANT 100-62.5-25 MCG/ACT IN AEPB: 1.0000 | INHALATION_SPRAY | Freq: Every morning | RESPIRATORY_TRACT | Status: DC

## 2021-04-11 MED ORDER — PREDNISONE 1 MG PO TABS
2.0000 mg | ORAL_TABLET | Freq: Every day | ORAL | Status: DC
  Administered 2021-04-12 – 2021-04-14 (×3): 2 mg via ORAL
  Filled 2021-04-11 (×4): qty 2

## 2021-04-11 MED ORDER — BACLOFEN 10 MG PO TABS
10.0000 mg | ORAL_TABLET | Freq: Every day | ORAL | Status: DC
  Administered 2021-04-12 – 2021-04-13 (×2): 10 mg via ORAL
  Filled 2021-04-11 (×2): qty 1

## 2021-04-11 MED ORDER — DULOXETINE HCL 60 MG PO CPEP
60.0000 mg | ORAL_CAPSULE | Freq: Every morning | ORAL | Status: DC
  Administered 2021-04-12 – 2021-04-14 (×3): 60 mg via ORAL
  Filled 2021-04-11 (×3): qty 1

## 2021-04-11 MED ORDER — MONTELUKAST SODIUM 10 MG PO TABS
10.0000 mg | ORAL_TABLET | Freq: Every day | ORAL | Status: DC
  Administered 2021-04-11 – 2021-04-14 (×4): 10 mg via ORAL
  Filled 2021-04-11 (×4): qty 1

## 2021-04-11 MED ORDER — CYANOCOBALAMIN 500 MCG PO TABS: 2500.0000 ug | ORAL_TABLET | ORAL | Status: DC

## 2021-04-11 MED ORDER — ENOXAPARIN SODIUM 30 MG/0.3ML IJ SOSY
30.0000 mg | PREFILLED_SYRINGE | INTRAMUSCULAR | Status: DC
  Administered 2021-04-11 – 2021-04-13 (×3): 30 mg via SUBCUTANEOUS
  Filled 2021-04-11 (×3): qty 0.3

## 2021-04-11 MED ORDER — ACETAMINOPHEN 650 MG RE SUPP: 650.0000 mg | Freq: Four times a day (QID) | RECTAL | Status: DC | PRN

## 2021-04-11 MED ORDER — DULOXETINE HCL 30 MG PO CPEP
30.0000 mg | ORAL_CAPSULE | Freq: Every morning | ORAL | Status: DC
  Administered 2021-04-12 – 2021-04-14 (×3): 30 mg via ORAL
  Filled 2021-04-11 (×3): qty 1

## 2021-04-11 MED ORDER — DOCUSATE SODIUM 100 MG PO CAPS
100.0000 mg | ORAL_CAPSULE | Freq: Every day | ORAL | Status: DC
  Administered 2021-04-11 – 2021-04-14 (×4): 100 mg via ORAL
  Filled 2021-04-11 (×4): qty 1

## 2021-04-11 MED ORDER — OXYCODONE-ACETAMINOPHEN 5-325 MG PO TABS
1.0000 | ORAL_TABLET | Freq: Once | ORAL | Status: AC
  Administered 2021-04-11: 1 via ORAL
  Filled 2021-04-11: qty 1

## 2021-04-11 MED ORDER — INSULIN ASPART 100 UNIT/ML IJ SOLN
0.0000 [IU] | Freq: Three times a day (TID) | INTRAMUSCULAR | Status: DC
  Administered 2021-04-12: 2 [IU] via SUBCUTANEOUS
  Administered 2021-04-13 (×2): 1 [IU] via SUBCUTANEOUS
  Administered 2021-04-14: 17:00:00 2 [IU] via SUBCUTANEOUS
  Administered 2021-04-14 (×2): 1 [IU] via SUBCUTANEOUS

## 2021-04-11 MED ORDER — MIRTAZAPINE 15 MG PO TABS
15.0000 mg | ORAL_TABLET | Freq: Every day | ORAL | Status: DC
  Administered 2021-04-12 – 2021-04-13 (×2): 15 mg via ORAL
  Filled 2021-04-11 (×2): qty 1

## 2021-04-11 MED ORDER — GABAPENTIN 100 MG PO CAPS
100.0000 mg | ORAL_CAPSULE | Freq: Every day | ORAL | Status: DC
  Administered 2021-04-12 – 2021-04-13 (×2): 100 mg via ORAL
  Filled 2021-04-11 (×2): qty 1

## 2021-04-11 MED ORDER — ALBUTEROL SULFATE HFA 108 (90 BASE) MCG/ACT IN AERS: 2.0000 | INHALATION_SPRAY | Freq: Four times a day (QID) | RESPIRATORY_TRACT | Status: DC | PRN

## 2021-04-11 MED ORDER — UMECLIDINIUM BROMIDE 62.5 MCG/ACT IN AEPB
1.0000 | INHALATION_SPRAY | Freq: Every day | RESPIRATORY_TRACT | Status: DC
  Administered 2021-04-12 – 2021-04-14 (×3): 1 via RESPIRATORY_TRACT
  Filled 2021-04-11: qty 7

## 2021-04-11 MED ORDER — FLUTICASONE FUROATE-VILANTEROL 100-25 MCG/ACT IN AEPB
1.0000 | INHALATION_SPRAY | Freq: Every day | RESPIRATORY_TRACT | Status: DC
  Administered 2021-04-12 – 2021-04-14 (×3): 1 via RESPIRATORY_TRACT
  Filled 2021-04-11: qty 28

## 2021-04-11 MED ORDER — ROSUVASTATIN CALCIUM 10 MG PO TABS
10.0000 mg | ORAL_TABLET | Freq: Every evening | ORAL | Status: DC
  Administered 2021-04-11 – 2021-04-14 (×4): 10 mg via ORAL
  Filled 2021-04-11 (×4): qty 1

## 2021-04-11 MED ORDER — ACETAMINOPHEN 325 MG PO TABS
650.0000 mg | ORAL_TABLET | Freq: Four times a day (QID) | ORAL | Status: DC | PRN
  Administered 2021-04-12 – 2021-04-14 (×3): 650 mg via ORAL
  Filled 2021-04-11 (×4): qty 2

## 2021-04-11 MED ORDER — METRONIDAZOLE 500 MG/100ML IV SOLN
500.0000 mg | Freq: Once | INTRAVENOUS | Status: AC
  Administered 2021-04-11: 500 mg via INTRAVENOUS
  Filled 2021-04-11: qty 100

## 2021-04-11 MED ORDER — SODIUM CHLORIDE 0.9 % IV SOLN
1.0000 g | Freq: Two times a day (BID) | INTRAVENOUS | Status: DC
  Administered 2021-04-11 – 2021-04-13 (×4): 1 g via INTRAVENOUS
  Filled 2021-04-11 (×5): qty 1

## 2021-04-11 MED ORDER — BUSPIRONE HCL 5 MG PO TABS: 5.0000 mg | ORAL_TABLET | Freq: Every day | ORAL | Status: DC | PRN

## 2021-04-11 MED ORDER — LINACLOTIDE 72 MCG PO CAPS
72.0000 ug | ORAL_CAPSULE | Freq: Every day | ORAL | Status: DC
  Administered 2021-04-12 – 2021-04-14 (×3): 72 ug via ORAL
  Filled 2021-04-11 (×4): qty 1

## 2021-04-11 NOTE — ED Provider Notes (Signed)
[facility] DEPT Provider Note   CSN: [id number] Arrival date & time: 04/11/21  1119     History  Chief Complaint  Patient presents with   Constipation    Kayla Thompson is a 86 y.o. female.  HPI  This is an 86 year old female presenting due to constipation and generalized weakness decline since discharge from the hospital a week ago.Marland Kitchen  History is provided by the patient's daughter who is at bedside and chart review, level 5 caveat applies secondary to AMS.  Patient has been having confusion intermittently for the last 2 months, it worsened while in the hospital a week ago for admission due to UTI and COVID.  She has been confused since being discharged, she had a positive blood culture and has been started on doxycycline outpatient.  Per the daughter patient has not had a bowel movement in 10 days.  She is not eating and drinking at home, complaining of abdominal pain.  No fevers at home, 1 episode of emesis 2 days ago.  Surgical history of ovarian hysterectomy with oophorectomy bilaterally.  Denies any other abdominal surgeries.  PMH: chronic diastolic CHF, hypertension, PAD, frequent UTIs, PMR on chronic steroids, asthma, insulin-dependent type 2 diabetes, hyperlipidemia, chronic constipation, hypothyroidism, depression, anxiety, fibromyalgia, osteoporosis, chronic pain, GERD, history of ovarian cancer status post TAH/BSO   Social: Patient lives independently in her home with 24-hour health provided.  Daughter states different family member stay with her on her rotation, she is with her mother for the next 3 months.  Of note, patient is status post partial lobectomy secondary to TB.  She has supplemental oxygen prescribed to her at home as needed, patient does not enjoy wearing it and typically does not.  Not currently hypoxic.  Home Medications Prior to Admission medications   Medication Sig Start Date End Date Taking? Authorizing Provider  acetaminophen  (TYLENOL) 650 MG CR tablet Take 1 tablet (650 mg total) by mouth See admin instructions. Take 1,300 mg by mouth every 4-5 hours IN CONJUNCTION WITH one Percocet 7.5/325 tablet- for pain 03/31/21   Kathie Dike, MD  albuterol (PROAIR HFA) 108 (90 Base) MCG/ACT inhaler Inhale 2 puffs into the lungs every 6 (six) hours as needed for wheezing or shortness of breath. 08/27/19   Baird Lyons D, MD  Apoaequorin (PREVAGEN PO) Take 1 tablet by mouth every evening.    [provider]  Ascorbic Acid (VITAMIN C) 1000 MG tablet Take 1,000 mg by mouth in the morning and at bedtime.    [provider]  baclofen (LIORESAL) 10 MG tablet TAKE 0.5-1 TABLET BY MOUTH TWICE DAILY AS NEEDED FOR MUSCLE SPASMS OR PAIN 02/09/21   [name], MD  BD PEN NEEDLE NANO 2ND GEN 32G X 4 MM MISC USE AS DIRECTED 4 TIMES A DAY 10/22/20   [name], MD  busPIRone (BUSPAR) 5 MG tablet Take 5-10 mg by mouth daily as needed (for anxiety).    [provider]  Calcium Carbonate (CALTRATE 600 PO) Take 600 mg by mouth in the morning and at bedtime.    [provider]  cholecalciferol (VITAMIN D3) 25 MCG (1000 UNIT) tablet Take 2,000 Units by mouth in the morning.    [provider]  Cyanocobalamin (VITAMIN B 12 PO) Take 2,500 mcg by mouth every Thursday.    [provider]  denosumab (PROLIA) 60 MG/ML SOSY injection Inject 60 mg into the skin every 6 (six) months.    [provider]  Dextromethorphan-guaiFENesin (MUCINEX DM MAXIMUM STRENGTH) 60-1200 MG TB12 Take 1 tablet by mouth in the morning and at bedtime.    [provider]  docusate sodium (COLACE) 100 MG capsule Take 1 capsule (100 mg total) by mouth daily. 01/28/21   [name], MD  doxycycline (VIBRAMYCIN) 100 MG capsule Take 1 capsule (100 mg total) by mouth 2 (two) times daily. 03/31/21   Kathie Dike, MD  DULoxetine (CYMBALTA) 30 MG capsule Take 1 capsule (30 mg total) by mouth  daily. Patient taking differently: Take 30 mg by mouth in the morning. 05/19/20   [name], MD  DULoxetine (CYMBALTA) 60 MG capsule Take 1 capsule (60 mg total) by mouth daily. Patient taking differently: Take 60 mg by mouth in the morning. 05/19/20   [name], MD  estradiol (ESTRACE) 0.1 MG/GM vaginal cream Place 1 Applicatorful vaginally See admin instructions. Takes 2 times a week 06/28/20   [provider]  feeding supplement (ENSURE ENLIVE / ENSURE PLUS) LIQD Take 237 mLs by mouth 2 (two) times daily between meals. Patient taking differently: Take 237 mLs by mouth 3 (three) times daily with meals. 01/16/20   Hongalgi, Lenis Dickinson, MD  gabapentin (NEURONTIN) 100 MG capsule Take 100 mg by mouth at bedtime. 11/03/20   [provider]  glucose blood (FREESTYLE LITE) test strip USE TO TEST BLOOD SUGAR 3 TIMES DAILY. 09/19/20   Philemon Kingdom, MD  insulin glargine (LANTUS) 100 UNIT/ML Solostar Pen Inject 5 Units into the skin in the morning.    [provider]  insulin lispro (HUMALOG KWIKPEN) 100 UNIT/ML KwikPen Inject 2-4 Units into the skin daily before supper. Patient taking differently: Inject 2 Units into the skin See admin instructions. Inject 2 units into the skin before meals as needed for a low BGL 12/18/20   Philemon Kingdom, MD  ipratropium-albuterol (DUONEB) 0.5-2.5 (3) MG/3ML SOLN Inhale 3 mLs into the lungs every 4 (four) hours as needed. 01/19/21   Baird Lyons D, MD  Lancets (FREESTYLE) lancets Use to test blood sugar 3 times daily. Dx: E11.65 10/14/16   Philemon Kingdom, MD  levothyroxine (SYNTHROID) 50 MCG tablet TAKE 1 TABLET BY MOUTH EVERY DAY BEFORE BREAKFAST 03/25/21   [name], MD  linaclotide [facility]) 72 MCG capsule Take 1 capsule (72 mcg total) by mouth daily before breakfast. 03/11/21   Biagio Borg, MD  methenamine (MANDELAMINE) 1 g tablet Take 1,000 mg by mouth in the morning and at bedtime.    [provider]  mirtazapine (REMERON) 15 MG tablet TAKE 1 TABLET BY MOUTH EVERYDAY AT BEDTIME 03/20/21   [name], MD  Misc Natural Products (TART CHERRY ADVANCED PO) Take 1,000 mg by mouth every evening.    [provider]  montelukast (SINGULAIR) 10 MG tablet TAKE 1 TABLET BY MOUTH EVERY DAY 02/07/21   Baird Lyons D, MD  naloxone [facility]) nasal spray 4 mg/0.1 mL Place 1 spray into the nose once as needed (overdose). 06/27/20   [provider]  nitrofurantoin, macrocrystal-monohydrate, (MACROBID) 100 MG capsule Take 100 mg by mouth daily. 11/15/20   [provider]  omeprazole (PRILOSEC) 20 MG capsule TAKE 1 CAPSULE TWICE A DAY BEFORE A MEAL Patient taking differently: Take 20 mg by mouth 2 (two) times daily before a meal. 10/03/20   Mansouraty, Telford Nab., MD  oxyCODONE-acetaminophen (PERCOCET) 7.5-325 MG tablet Take 1 tablet by mouth See admin instructions. Take 1 tablet by mouth every 4-5 hours (IN CONJUNCTION WITH  1,300 mg of Tylenol) 10/23/20   [provider]  polyethylene glycol (MIRALAX / GLYCOLAX) 17 g packet Take 17 g by mouth daily.    [provider]  predniSONE (DELTASONE) 1 MG tablet Take 1 tablet (1 mg total) by mouth in the morning and at bedtime. 03/31/21   Kathie Dike, MD  rosuvastatin (CRESTOR) 10 MG tablet Take 1 tablet (10 mg total) by mouth every evening. 05/19/20   [name], MD  TRELEGY ELLIPTA 100-62.5-25 MCG/INH AEPB INHALE 1 PUFF INTO THE LUNGS DAILY. RINSE MOUTH Patient taking differently: Inhale 1 puff into the lungs in the morning. Rinse mouth 07/05/20   Baird Lyons D, MD  TURMERIC PO Take 1,000 mg by mouth in the morning.    [provider]      Allergies    Tape, Lasix [furosemide], Latex, and Penicillins    Review of Systems   Review of Systems  Gastrointestinal:  Positive for constipation.  PER HPI  Physical Exam Updated Vital Signs BP (!) 142/85    Pulse (!) 101    Temp 97.8  F (36.6 C)    Resp 16    Ht 4\' 11"  (1.499 m)    Wt 41.9 kg    LMP  (LMP Unknown)    SpO2 98%    BMI 18.66 kg/m  Physical Exam Vitals and nursing note reviewed. Exam conducted with a chaperone present.  Constitutional:      Appearance: Normal appearance. She is ill-appearing.  HENT:     Head: Normocephalic and atraumatic.  Eyes:     General: No scleral icterus.       Right eye: No discharge.        Left eye: No discharge.     Extraocular Movements: Extraocular movements intact.     Pupils: Pupils are equal, round, and reactive to light.  Cardiovascular:     Rate and Rhythm: Regular rhythm. Tachycardia present.     Pulses: Normal pulses.     Heart sounds: Normal heart sounds. No murmur heard.   No friction rub. No gallop.  Pulmonary:     Effort: Pulmonary effort is normal. No respiratory distress.     Comments: Decreased breath sounds bilaterally Abdominal:     General: Abdomen is flat. Bowel sounds are normal. There is no distension.     Palpations: Abdomen is soft.     Tenderness: There is abdominal tenderness.  Musculoskeletal:     Right lower leg: No edema.     Left lower leg: No edema.  Skin:    General: Skin is warm and dry.     Coloration: Skin is not jaundiced.  Neurological:     Mental Status: She is alert. She is disoriented.     Comments: Patient disoriented, responsive and follows some commands but clearly altered.   ED Results / Procedures / Treatments   Labs (all labs ordered are listed, but only abnormal results are displayed) Labs Reviewed  CBC - Abnormal; Notable for the following components:      Result Value   WBC 20.0 (*)    RBC 2.87 (*)    Hemoglobin 10.4 (*)    HCT 31.2 (*)    MCV 108.7 (*)    MCH 36.2 (*)    Platelets 445 (*)    All other components within normal limits  URINE CULTURE  CULTURE, BLOOD (ROUTINE X 2)  CULTURE, BLOOD (ROUTINE X 2)  RESP PANEL BY RT-PCR (FLU A&B, COVID) ARPGX2  URINALYSIS,  ROUTINE W REFLEX MICROSCOPIC   COMPREHENSIVE METABOLIC PANEL  LIPASE, BLOOD  LACTIC ACID, PLASMA  LACTIC ACID, PLASMA  PROTIME-INR  APTT    EKG None  Radiology No results found.  Procedures Fecal disimpaction  Date/Time: 04/11/2021 4:40 PM Performed by: Sherrill Raring, PA-C Authorized by: Sherrill Raring, PA-C  Consent: The procedure was performed in an emergent situation. Verbal consent obtained. Risks and benefits: risks, benefits and alternatives were discussed Patient states understanding of procedure being performed: altered.. Patient's understanding of procedure matches consent: altered. Procedure consent matches procedure scheduled: altered. Relevant documents: relevant documents present and verified Test results: test results available and properly labeled Site marked: the operative site was marked Imaging studies: imaging studies available Required items: required blood products, implants, devices, and special equipment available Patient identity confirmed: verbally with patient Time out: Immediately prior to procedure a "time out" was called to verify the correct patient, procedure, equipment, support staff and site/side marked as required. Preparation: Patient was prepped and draped in the usual sterile fashion. Local anesthesia used: no  Anesthesia: Local anesthesia used: no  Sedation: Patient sedated: no  Patient tolerance: patient tolerated the procedure well with no immediate complications   .Critical Care Performed by: Sherrill Raring, PA-C Authorized by: Sherrill Raring, PA-C   Critical care provider statement:    Critical care time (minutes):  30   Critical care start time:  04/11/2021 2:00 PM   Critical care end time:  04/11/2021 2:30 PM   Critical care was necessary to treat or prevent imminent or life-threatening deterioration of the following conditions:  Sepsis   Critical care was time spent personally by me on the following activities:  Development of treatment plan with patient or  surrogate, discussions with consultants, evaluation of patient's response to treatment, examination of patient, ordering and review of laboratory studies, ordering and review of radiographic studies, ordering and performing treatments and interventions, pulse oximetry, re-evaluation of patient's condition and review of old charts   Care discussed with: admitting provider       Medications Ordered in ED Medications  lactated ringers infusion (has no administration in time range)  lactated ringers bolus 1,000 mL (has no administration in time range)    And  lactated ringers bolus 500 mL (has no administration in time range)  ceFEPIme (MAXIPIME) 2 g in sodium chloride 0.9 % 100 mL IVPB (has no administration in time range)  metroNIDAZOLE (FLAGYL) IVPB 500 mg (has no administration in time range)    ED Course/ Medical Decision Making/ A&P Clinical Course as of 04/11/21 1640  Sat Apr 11, 2021  1439 SARS Coronavirus 2 by RT PCR(!): POSITIVE [JL]  1439 WBC(!): 20.0 [JL]    Clinical Course User Index [JL] Regan Lemming, MD                           Medical Decision Making Amount and/or Complexity of Data Reviewed Labs: ordered. Decision-making details documented in ED Course. Radiology: ordered. ECG/medicine tests: ordered.  Risk Prescription drug management. Decision regarding hospitalization.   This is an 86 year old female presenting due to constipation and generalized weakness and decline.  She is tachycardic and ill-appearing on exam.  Abdomen is soft but diffusely tender.  PMH chronic diastolic CHF, hypertension, PAD, frequent UTIs, PMR on chronic steroids, asthma, insulin-dependent type 2 diabetes, hyperlipidemia, chronic constipation, hypothyroidism, depression, anxiety, fibromyalgia, osteoporosis, chronic pain, GERD, history of ovarian cancer status post TAH/BSO .  Additional history obtained: -Additional history  obtained from patient's daughter at bedside. -External  records from outside source obtained and reviewed including: Chart review including previous notes, labs, imaging, consultation notes  Patient was admitted 03/26/2021 due to COVID and UTI.  She was altered at the time of discharge, initially was improved but recently had blood cultures grow back.  Was started on doxycycline, has been increasingly weak and constipated at home.    Lab Tests: -I ordered, reviewed, and interpreted labs.  The pertinent results include:   Leukocytosis of 20.  This in commendation with tachycardia and suspected source UTI meets sepsis criteria.  Code sepsis initiated.  Patient given the resuscitation and started on IV antibiotics.  Patient is COVID-positive, possibly still from previous admission a few weeks ago.  UA notable for leukocytosis, this in combination with cystic changes on CT is suggestive of continued cystitis.  CT did not show any evidence of cervical oil colitis.  Also not consistent with a bowel obstruction.  There is evidence of fecal impaction, proceeded with fecal disimpaction while in the ED with success.  EKG -Sinus tachycardia     Imaging Studies ordered: -I ordered imaging studies including CT abdomen and pelvis, DG chest, DG abdomen -I independently visualized and interpreted imaging which showed constipation, cystitis, chronic interstitial coarsening to the lungs. -I agree with the radiologist interpretation   Medicines ordered and prescription drug management: -I ordered medication including Flagyl and cefepime and lactated Ringer's for sepsis with UTI as suspected source -Reevaluation of the patient after these medicines showed that the patient stayed the same -I have reviewed the patients home medicines and have made adjustments as needed   Consultations Obtained: I requested consultation with the hospitalist,  and discussed lab and imaging findings as well as pertinent plan - they recommend: admit    Cardiac Monitoring: The patient  was maintained on a cardiac monitor.  I personally viewed and interpreted the cardiac monitored which showed an underlying rhythm of: Sinus tachycardia   Reevaluation: After the interventions noted above, I reevaluated the patient and found that they have :stayed the same   Dispostion: We will plan to admit patient due to UTI, sepsis        Final Clinical Impression(s) / ED Diagnoses Final diagnoses:  Constipation    Rx / DC Orders ED Discharge Orders     None         Sherrill Raring, Hershal Coria 04/11/21 2313    Regan Lemming, MD 04/12/21 [phone]

## 2021-04-11 NOTE — Sepsis Progress Note (Signed)
Elink following code sepsis °

## 2021-04-11 NOTE — ED Notes (Signed)
Patient transported to CT 

## 2021-04-11 NOTE — ED Triage Notes (Signed)
Patient here POV from Home with Constipation.  Patient Family states she has not had a BM for at least 10 days.   Family also states she is more altered than usual.  Patient has been taking Antibiotics recently for Positive Blood Culture.  Patient was recently admitted for COVID-19 and a UTI. Discharged 03/31/21.  NAD Noted during Triage. A&Ox4. GCS 15. BIB Wheelchair.

## 2021-04-11 NOTE — H&P (Addendum)
History and Physical    [name] YHC:[id number] DOB: 1936-03-17 DOA: 04/11/2021  PCP: [name], MD  Patient coming from: Home.  History obtained from patient's daughter.  Chief Complaint: Increasing confusion abdominal discomfort.  HPI: Kayla Thompson is a 86 y.o. female with history of polymyalgia rheumatica on prednisone, diabetes mellitus type 2, COPD, diastolic CHF, chronic leukocytosis, macrocytic anemia, history of ovarian cancer status post hysterectomy and oophorectomy who was recently admitted to the hospital for sepsis and respiratory failure with COVID infection discharged on March 31, 2021 during which patient also was found to have actinomyces bacteremia and was started on doxycycline since patient had allergy to penicillin was found to be increasingly confused weak not eating well and complained of abdominal discomfort as per the patient's daughter.  Patient has not had a bowel movement for almost 10 days.  Patient also has a chronic rash in the groin area and has multiple skin changes in the lower extremity.  ED Course: In the ER it was noted that patient was tachypneic tachycardic confused.  UA is consistent with UTI.  Patient lab work does show leukocytosis more than her usual.  CT abdomen pelvis done shows features concerning for large volume of stool and also concerning features for cystitis.  Had cultures drawn and started on empiric antibiotics.  On my exam patient is still confused but moving all extremities.  Groin area skin appears to be erythematous.  COVID test is positive.  Review of Systems: As per HPI, rest all negative.   Past Medical History:  Diagnosis Date   Anxiety    Arthritis    Asthma    Cataracts, bilateral    removed by surgery   COPD (chronic obstructive pulmonary disease) ([place])    Depression    Diabetes mellitus without complication (HCC)    type 2   Dyspnea    with exertion   Fibromyalgia    GERD (gastroesophageal  reflux disease)    History of chemotherapy    History of fractured pelvis    Hypertension    Hypothyroidism    Osteoporosis 12/2017   T score -2.8 stable from prior DEXA   Osteoporosis    Ovarian cancer ([place])    Pneumonia    several times    Past Surgical History:  Procedure Laterality Date   ABDOMINAL HYSTERECTOMY  1996   ANKLE FRACTURE SURGERY     plate and screws   BIOPSY  02/05/2019   Procedure: BIOPSY;  Surgeon: Irving Copas., MD;  Location: [facility] ENDOSCOPY;  Service: Gastroenterology;;   BLADDER SUSPENSION     CATARACT EXTRACTION     COLONOSCOPY     ESOPHAGEAL BRUSHING  02/05/2019   Procedure: ESOPHAGEAL BRUSHING;  Surgeon: Irving Copas., MD;  Location: [facility] ENDOSCOPY;  Service: Gastroenterology;;   ESOPHAGOGASTRODUODENOSCOPY (EGD) WITH PROPOFOL N/A 02/05/2019   Procedure: ESOPHAGOGASTRODUODENOSCOPY (EGD) WITH PROPOFOL with dialtion;  Surgeon: Irving Copas., MD;  Location: [place];  Service: Gastroenterology;  Laterality: N/A;   EYE SURGERY     cataracts removed-bilateral   hip surgey     knee surgey     LUNG SURGERY     OOPHORECTOMY     BSO   SAVORY DILATION N/A 02/05/2019   Procedure: SAVORY DILATION;  Surgeon: Irving Copas., MD;  Location: [place];  Service: Gastroenterology;  Laterality: N/A;   TONSILLECTOMY     UPPER GI ENDOSCOPY       reports that she has never smoked.  She has never been exposed to tobacco smoke. She has never used smokeless tobacco. She reports that she does not drink alcohol and does not use drugs.  Allergies  Allergen Reactions   Tape Other (See Comments)    SKIN IS VERY THIN AND DELICATE- will tear like paper!!   Lasix [Furosemide] Other (See Comments)    Confusion- MD stopped this med   Latex Rash   Penicillins Rash    Has patient had a PCN reaction causing immediate rash, facial/tongue/throat swelling, SOB or lightheadedness with hypotension: No Has patient had a PCN reaction causing  severe rash involving mucus membranes or skin necrosis: No Has patient had a PCN reaction that required hospitalization: No Has patient had a PCN reaction occurring within the last 10 years: No If all of the above answers are "NO", then may proceed with Cephalosporin use.     Family History  Problem Relation Age of Onset   Lung cancer Mother    CAD Father    Diabetes Father    Lung cancer Maternal Aunt    Diabetes Paternal Aunt    Diabetes Paternal Uncle    Diabetes Paternal Grandmother    Diabetes Paternal Grandfather    Colon cancer Neg Hx    Esophageal cancer Neg Hx    Inflammatory bowel disease Neg Hx    Liver disease Neg Hx    Pancreatic cancer Neg Hx    Rectal cancer Neg Hx    Stomach cancer Neg Hx     Prior to Admission medications   Medication Sig Start Date End Date Taking? Authorizing Provider  acetaminophen (TYLENOL) 650 MG CR tablet Take 1 tablet (650 mg total) by mouth See admin instructions. Take 1,300 mg by mouth every 4-5 hours IN CONJUNCTION WITH one Percocet 7.5/325 tablet- for pain 03/31/21   Kathie Dike, MD  albuterol (PROAIR HFA) 108 (90 Base) MCG/ACT inhaler Inhale 2 puffs into the lungs every 6 (six) hours as needed for wheezing or shortness of breath. 08/27/19   Baird Lyons D, MD  Apoaequorin (PREVAGEN PO) Take 1 tablet by mouth every evening.    [provider]  Ascorbic Acid (VITAMIN C) 1000 MG tablet Take 1,000 mg by mouth in the morning and at bedtime.    [provider]  baclofen (LIORESAL) 10 MG tablet TAKE 0.5-1 TABLET BY MOUTH TWICE DAILY AS NEEDED FOR MUSCLE SPASMS OR PAIN 02/09/21   [name], MD  BD PEN NEEDLE NANO 2ND GEN 32G X 4 MM MISC USE AS DIRECTED 4 TIMES A DAY 10/22/20   [name], MD  busPIRone (BUSPAR) 5 MG tablet Take 5-10 mg by mouth daily as needed (for anxiety).    [provider]  Calcium Carbonate (CALTRATE 600 PO) Take 600 mg by mouth in the morning and at bedtime.    [provider]  cholecalciferol (VITAMIN D3) 25 MCG (1000 UNIT) tablet Take 2,000 Units by mouth in the morning.    [provider]  Cyanocobalamin (VITAMIN B 12 PO) Take 2,500 mcg by mouth every Thursday.    [provider]  denosumab (PROLIA) 60 MG/ML SOSY injection Inject 60 mg into the skin every 6 (six) months.    [provider]  Dextromethorphan-guaiFENesin (MUCINEX DM MAXIMUM STRENGTH) 60-1200 MG TB12 Take 1 tablet by mouth in the morning and at bedtime.    [provider]  docusate sodium (COLACE) 100 MG capsule Take 1 capsule (100 mg total) by mouth daily. 01/28/21  [name], MD  doxycycline (VIBRAMYCIN) 100 MG capsule Take 1 capsule (100 mg total) by mouth 2 (two) times daily. 03/31/21   Kathie Dike, MD  DULoxetine (CYMBALTA) 30 MG capsule Take 1 capsule (30 mg total) by mouth daily. Patient taking differently: Take 30 mg by mouth in the morning. 05/19/20   [name], MD  DULoxetine (CYMBALTA) 60 MG capsule Take 1 capsule (60 mg total) by mouth daily. Patient taking differently: Take 60 mg by mouth in the morning. 05/19/20   [name], MD  estradiol (ESTRACE) 0.1 MG/GM vaginal cream Place 1 Applicatorful vaginally See admin instructions. Takes 2 times a week 06/28/20   [provider]  feeding supplement (ENSURE ENLIVE / ENSURE PLUS) LIQD Take 237 mLs by mouth 2 (two) times daily between meals. Patient taking differently: Take 237 mLs by mouth 3 (three) times daily with meals. 01/16/20   Hongalgi, Lenis Dickinson, MD  gabapentin (NEURONTIN) 100 MG capsule Take 100 mg by mouth at bedtime. 11/03/20   [provider]  glucose blood (FREESTYLE LITE) test strip USE TO TEST BLOOD SUGAR 3 TIMES DAILY. 09/19/20   Philemon Kingdom, MD  insulin glargine (LANTUS) 100 UNIT/ML Solostar Pen Inject 5 Units into the skin in the morning.    [provider]  insulin lispro (HUMALOG KWIKPEN) 100 UNIT/ML KwikPen Inject  2-4 Units into the skin daily before supper. Patient taking differently: Inject 2 Units into the skin See admin instructions. Inject 2 units into the skin before meals as needed for a low BGL 12/18/20   Philemon Kingdom, MD  ipratropium-albuterol (DUONEB) 0.5-2.5 (3) MG/3ML SOLN Inhale 3 mLs into the lungs every 4 (four) hours as needed. 01/19/21   Baird Lyons D, MD  Lancets (FREESTYLE) lancets Use to test blood sugar 3 times daily. Dx: E11.65 10/14/16   Philemon Kingdom, MD  levothyroxine (SYNTHROID) 50 MCG tablet TAKE 1 TABLET BY MOUTH EVERY DAY BEFORE BREAKFAST 03/25/21   [name], MD  linaclotide [facility]) 72 MCG capsule Take 1 capsule (72 mcg total) by mouth daily before breakfast. 03/11/21   Biagio Borg, MD  methenamine (MANDELAMINE) 1 g tablet Take 1,000 mg by mouth in the morning and at bedtime.    [provider]  mirtazapine (REMERON) 15 MG tablet TAKE 1 TABLET BY MOUTH EVERYDAY AT BEDTIME 03/20/21   [name], MD  Misc Natural Products (TART CHERRY ADVANCED PO) Take 1,000 mg by mouth every evening.    [provider]  montelukast (SINGULAIR) 10 MG tablet TAKE 1 TABLET BY MOUTH EVERY DAY 02/07/21   Baird Lyons D, MD  naloxone [facility]) nasal spray 4 mg/0.1 mL Place 1 spray into the nose once as needed (overdose). 06/27/20   [provider]  nitrofurantoin, macrocrystal-monohydrate, (MACROBID) 100 MG capsule Take 100 mg by mouth daily. 11/15/20   [provider]  omeprazole (PRILOSEC) 20 MG capsule TAKE 1 CAPSULE TWICE A DAY BEFORE A MEAL Patient taking differently: Take 20 mg by mouth 2 (two) times daily before a meal. 10/03/20   Mansouraty, Telford Nab., MD  oxyCODONE-acetaminophen (PERCOCET) 7.5-325 MG tablet Take 1 tablet by mouth See admin instructions. Take 1 tablet by mouth every 4-5 hours (IN CONJUNCTION WITH 1,300 mg of Tylenol) 10/23/20   [provider]  polyethylene glycol (MIRALAX / GLYCOLAX) 17 g packet Take 17  g by mouth daily.    [provider]  predniSONE (DELTASONE) 1 MG tablet Take 1 tablet (1 mg total) by  mouth in the morning and at bedtime. 03/31/21   Kathie Dike, MD  rosuvastatin (CRESTOR) 10 MG tablet Take 1 tablet (10 mg total) by mouth every evening. 05/19/20   [name], MD  TRELEGY ELLIPTA 100-62.5-25 MCG/INH AEPB INHALE 1 PUFF INTO THE LUNGS DAILY. RINSE MOUTH Patient taking differently: Inhale 1 puff into the lungs in the morning. Rinse mouth 07/05/20   Baird Lyons D, MD  TURMERIC PO Take 1,000 mg by mouth in the morning.    [provider]    Physical Exam: Constitutional: Moderately built and nourished. Vitals:   04/11/21 1500 04/11/21 1600 04/11/21 1630 04/11/21 2005  BP: (!) 152/78 134/74 134/67 (!) 100/49  Pulse: 95 (!) 102 91 (!) 110  Resp: (!) 21 17 (!) 23   Temp:    98.3 F (36.8 C)  TempSrc:    Oral  SpO2: 97% 100% 98% 98%  Weight:    40.7 kg  Height:    4\' 11"  (1.499 m)   Eyes: Anicteric no pallor. ENMT: No discharge from the ears eyes nose and mouth. Neck: No mass felt.  No neck rigidity. Respiratory: No rhonchi or crepitations. Cardiovascular: S1-S2 heard. Abdomen: Soft nontender bowel sound present. Musculoskeletal: No edema but has chronic skin changes on the anterior shin. Skin: Groin skin looks erythematous extending up to the buttocks. Neurologic: Alert awake but not following commands appears confused moving all extremities. Psychiatric: Appears confused.   Labs on Admission: I have personally reviewed following labs and imaging studies  CBC: Recent Labs  Lab 04/11/21 1151  WBC 20.0*  HGB 10.4*  HCT 31.2*  MCV 108.7*  PLT 638*   Basic Metabolic Panel: Recent Labs  Lab 04/11/21 1550  NA 131*  K 3.4*  CL 103  CO2 18*  GLUCOSE 144*  BUN 33*  CREATININE 0.64  CALCIUM 8.2*   GFR: Estimated Creatinine Clearance: 34.2 mL/min (by C-G formula based on SCr of 0.64 mg/dL). Liver Function Tests: Recent Labs   Lab 04/11/21 1550  AST 14*  ALT 12  ALKPHOS 33*  BILITOT 0.4  PROT 5.1*  ALBUMIN 2.8*   Recent Labs  Lab 04/11/21 1550  LIPASE 10*   No results for input(s): AMMONIA in the last 168 hours. Coagulation Profile: Recent Labs  Lab 04/11/21 1310  INR 1.0   Cardiac Enzymes: No results for input(s): CKTOTAL, CKMB, CKMBINDEX, TROPONINI in the last 168 hours. BNP (last 3 results) Recent Labs    11/26/20 1339  PROBNP 291   HbA1C: No results for input(s): HGBA1C in the last 72 hours. CBG: No results for input(s): GLUCAP in the last 168 hours. Lipid Profile: No results for input(s): CHOL, HDL, LDLCALC, TRIG, CHOLHDL, LDLDIRECT in the last 72 hours. Thyroid Function Tests: No results for input(s): TSH, T4TOTAL, FREET4, T3FREE, THYROIDAB in the last 72 hours. Anemia Panel: No results for input(s): VITAMINB12, FOLATE, FERRITIN, TIBC, IRON, RETICCTPCT in the last 72 hours. Urine analysis:    Component Value Date/Time   COLORURINE YELLOW 04/11/2021 1143   APPEARANCEUR CLOUDY (A) 04/11/2021 1143   LABSPEC 1.024 04/11/2021 1143   PHURINE 6.0 04/11/2021 1143   GLUCOSEU NEGATIVE 04/11/2021 1143   GLUCOSEU NEGATIVE 07/17/2020 0939   HGBUR SMALL (A) 04/11/2021 1143   BILIRUBINUR NEGATIVE 04/11/2021 1143   BILIRUBINUR 1+ 06/13/2019 1531   KETONESUR NEGATIVE 04/11/2021 1143   PROTEINUR 30 (A) 04/11/2021 1143   UROBILINOGEN 0.2 07/17/2020 0939   NITRITE NEGATIVE 04/11/2021 1143   LEUKOCYTESUR LARGE (A) 04/11/2021 1143  Sepsis Labs: @LABRCNTIP (procalcitonin:4,lacticidven:4) ) Recent Results (from the past 240 hour(s))  Resp Panel by RT-PCR (Flu A&B, Covid) Nasopharyngeal Swab     Status: Abnormal   Collection Time: 04/11/21  1:25 PM   Specimen: Nasopharyngeal Swab; Nasopharyngeal(NP) swabs in vial transport medium  Result Value Ref Range Status   SARS Coronavirus 2 by RT PCR POSITIVE (A) NEGATIVE Final    Comment: (NOTE) SARS-CoV-2 target nucleic acids are DETECTED.  The  SARS-CoV-2 RNA is generally detectable in upper respiratory specimens during the acute phase of infection. Positive results are indicative of the presence of the identified virus, but do not rule out bacterial infection or co-infection with other pathogens not detected by the test. Clinical correlation with patient history and other diagnostic information is necessary to determine patient infection status. The expected result is Negative.  Fact Sheet for Patients: EntrepreneurPulse.com.au  Fact Sheet for Healthcare Providers: IncredibleEmployment.be  This test is not yet approved or cleared by the [place] FDA and  has been authorized for detection and/or diagnosis of SARS-CoV-2 by FDA under an Emergency Use Authorization (EUA).  This EUA will remain in effect (meaning this test can be used) for the duration of  the COVID-19 declaration under Section 564(b)(1) of the A ct, 21 U.S.C. section 360bbb-3(b)(1), unless the authorization is terminated or revoked sooner.     Influenza A by PCR NEGATIVE NEGATIVE Final   Influenza B by PCR NEGATIVE NEGATIVE Final    Comment: (NOTE) The Xpert Xpress SARS-CoV-2/FLU/RSV plus assay is intended as an aid in the diagnosis of influenza from Nasopharyngeal swab specimens and should not be used as a sole basis for treatment. Nasal washings and aspirates are unacceptable for Xpert Xpress SARS-CoV-2/FLU/RSV testing.  Fact Sheet for Patients: EntrepreneurPulse.com.au  Fact Sheet for Healthcare Providers: IncredibleEmployment.be  This test is not yet approved or cleared by the [place] FDA and has been authorized for detection and/or diagnosis of SARS-CoV-2 by FDA under an Emergency Use Authorization (EUA). This EUA will remain in effect (meaning this test can be used) for the duration of the COVID-19 declaration under Section 564(b)(1) of the Act, 21 U.S.C. section  360bbb-3(b)(1), unless the authorization is terminated or revoked.  Performed at [facility], [address], [place], [place] [address]      Radiological Exams on Admission: CT Abdomen Pelvis W Contrast  Result Date: 04/11/2021 CLINICAL DATA:  Constipation.  Bowel obstruction suspected. EXAM: CT ABDOMEN AND PELVIS WITH CONTRAST TECHNIQUE: Multidetector CT imaging of the abdomen and pelvis was performed using the standard protocol following bolus administration of intravenous contrast. RADIATION DOSE REDUCTION: This exam was performed according to the departmental dose-optimization program which includes automated exposure control, adjustment of the mA and/or kV according to patient size and/or use of iterative reconstruction technique. CONTRAST:  60mL OMNIPAQUE IOHEXOL 300 MG/ML  SOLN COMPARISON:  01/10/2021 FINDINGS: Lower chest: Similar appearance architectural distortion and scarring in the right lower lobe with some peripheral tree-in-bud nodularity in the lower lungs bilaterally, similar to prior and suggesting atypical infection. 6 mm left lower lobe nodule on image 12/series 6 is stable and also unchanged comparing back to 01/25/2019. Hepatobiliary: No suspicious focal abnormality within the liver parenchyma. Gallbladder is distended. No intrahepatic or extrahepatic biliary dilation. Pancreas: No focal mass lesion. No dilatation of the main duct. No intraparenchymal cyst. No peripancreatic edema. Spleen: No splenomegaly. No focal mass lesion. Adrenals/Urinary Tract: No adrenal nodule or mass. Kidneys unremarkable. No evidence for hydroureter. Diffuse irregular bladder wall thickening  noted with some mucosal hyperenhancement posteriorly. Stomach/Bowel: Stomach is nondistended. Duodenum is normally positioned as is the ligament of Treitz. No small bowel wall thickening. No small bowel dilatation. The terminal ileum is normal. The appendix is normal. Prominent stool volume noted  with large volume formed stool in the sigmoid colon. Rectal vault is stool-filled measuring 6.9 x 7.7 cm diameter on axial imaging. Vascular/Lymphatic: There is moderate atherosclerotic calcification of the abdominal aorta without aneurysm. There is no gastrohepatic or hepatoduodenal ligament lymphadenopathy. No retroperitoneal or mesenteric lymphadenopathy. No pelvic sidewall lymphadenopathy. Surgical clips in the retroperitoneal space and pelvic sidewall suggest prior lymph node dissection. Reproductive: Uterus is surgically absent. There is no adnexal mass. Other: No intraperitoneal free fluid. Musculoskeletal: Mild presacral edema evident, possibly related to the rectal distension. Status post right hip replacement chronic posttraumatic deformity noted both superior and inferior pubic rami. Nonacute posterior right rib fractures evident. IMPRESSION: 1. Prominent stool volume with large volume formed stool in the sigmoid colon. Rectal vault is stool-filled measuring 6.9 x 7.7 cm diameter on axial imaging. Imaging features raise concern for fecal impaction. 2. Diffuse irregular bladder wall thickening with some mucosal hyperenhancement posteriorly. Cystitis could have this appearance. 3. Similar appearance architectural distortion and scarring in the right lower lobe with some peripheral tree-in-bud nodularity in the lower lungs bilaterally, similar to prior and suggesting sequelae of atypical infection. 4. Stable 6 mm left lower lobe pulmonary nodule comparing back to 01/25/2019 consistent with benign etiology. 5. Aortic Atherosclerosis (ICD10-I70.0). Electronically Signed   By: Misty Stanley M.D.   On: 04/11/2021 15:48   DG Chest Port 1 View  Result Date: 04/11/2021 CLINICAL DATA:  Possible sepsis. EXAM: PORTABLE CHEST 1 VIEW COMPARISON:  03/26/2021 FINDINGS: 1527 hours. The lungs are clear without focal pneumonia, edema, pneumothorax or pleural effusion. Interstitial markings are diffusely coarsened with  chronic features. The cardiopericardial silhouette is within normal limits for size. Advanced degenerative changes noted in both shoulders. Telemetry leads overlie the chest. IMPRESSION: Chronic interstitial coarsening without acute cardiopulmonary findings. Electronically Signed   By: Misty Stanley M.D.   On: 04/11/2021 15:37   DG Abd Portable 1 View  Result Date: 04/11/2021 CLINICAL DATA:  Constipation. EXAM: PORTABLE ABDOMEN - 1 VIEW COMPARISON:  01/11/2021 FINDINGS: A moderate to large stool burden is identified within the descending colon, sigmoid colon and rectum. No dilated loops of small bowel identified to suggest obstruction. Previous ORIF of the right femur. Surgical clips are identified within the lower abdomen in both sides of pelvis. IMPRESSION: Moderate to large stool burden within the descending colon, sigmoid colon and rectum compatible with constipation. Electronically Signed   By: Kerby Moors M.D.   On: 04/11/2021 12:57    EKG: Independently reviewed.  Normal sinus rhythm.  Assessment/Plan Active Problems:   DM2 (diabetes mellitus, type 2) (HCC)   Hypothyroidism   PMR (polymyalgia rheumatica) (HCC)   Memory changes   Acute lower UTI   Sepsis ([place])   Cystitis    Possible developing sepsis with recent blood cultures growing actinomyces presently patient with WBC count is more than her usual with patient having tachypnea tachycardia at presentation with UA consistent with UTI patient likely developing sepsis started on empiric antibiotics.  I did discuss with pharmacy about patient having actinomyces infection and prior history of ESBL per pharmacy meropenem will cover for both for now until we get repeat cultures. Acute encephalopathy with prior history of memory issues.  Could be from sepsis.  Will check CT head.  In addition we will also check B12 TSH ammonia levels.  For tonight I am holding off patient's gabapentin mirtazapine and baclofen.  May continue it tomorrow if  symptoms improved. Persistent COVID-positive test with no signs of active infection at this time.  Patient was treated for COVID infection about 2 weeks ago.  We will continue to monitor. History of polymyalgia rheumatica on prednisone 2 mg daily.  We will give 1 dose of stress dose steroids. Macrocytic anemia check anemia panel.  Follow CBC. Chronic leukocytosis has had followed with ophthalmologist in 2019.  May need referral again since patient's leukocytosis is persistent. Diabetes mellitus type 2 on Lantus 5 units in the morning. Hypothyroidism on Synthroid.  Checking TSH. History of COPD presently not actively wheezing. History of diastolic CHF appears dehydrated at this time. Groin skin erythema we will get wound team consult. History of chronic pain. Poor oral intake with weight loss we will get nutrition consult. Constipation add stool disimpacted in the ER.  Check TSH.  Since patient has symptoms concerning for possible [place] sepsis with recent actinomyces bacteremia with worsening mental status and leukocytosis will need close monitoring and further management and inpatient status.   DVT prophylaxis: Lovenox. Code Status: DNR. Family Communication: Patient's daughter. Disposition Plan: To be determined. Consults called: Wound team. Admission status: Inpatient.   Rise Patience MD [facility] Pager [phone].  If 7PM-7AM, please contact night-coverage [email or web address] Password [facility]  04/11/2021, 9:06 PM

## 2021-04-11 NOTE — Care Plan (Signed)
Was called by ED PA at [facility] ED to admit patient who is a 86 year old female, history of chronic diastolic CHF, hypertension, PAD, frequent UTIs, PMR on chronic steroids, recently hospital and discharged 03/31/2021 after being admitted with UTI and COVID status post treatment with IV antibiotics, IV remdesivir.  Post discharge patient noted to have positive blood cultures for actinomyces and placed on doxycycline in the outpatient setting presenting to the ED with constipation, generalized weakness and decline since discharge, failure to thrive, worsening mental status.  Work-up done concerning for sepsis felt secondary to UTI as patient noted to have a leukocytosis, tachypnea, tachycardia on presentation.  Code sepsis protocol initiated in the ED.  Patient placed empirically on IV cefepime.  Patient also manually disimpacted by ED PA.  Hospitalist consulted for admission for further management of sepsis secondary to UTI, constipation, generalized weakness.  Patient pancultured with results pending.  COVID-19 PCR positive likely from recent hospitalization.  Patient was treated with IV remdesivir and as such we will place on the airborne precautions until day 21 from initial COVID-19 PCR positivity.  Patient noted not hypoxic.  Patient not started on steroids or remdesivir.  Patient accepted to telemetry bed at [facility].  No charge.

## 2021-04-11 NOTE — ED Notes (Signed)
ED Provider at bedside. 

## 2021-04-11 NOTE — ED Notes (Signed)
Purewick placed, pt perineum and perianal area reddened and excoriated.   Small amount of stool noted in anal area.

## 2021-04-11 NOTE — Progress Notes (Signed)
Pharmacy Antibiotic Note  Kayla Thompson is a 86 y.o. female recently treated for actinomyces with doxycycline admitted on 04/11/2021 .  Pharmacy has been consulted for meropenem for sepis.  Plan: Meropenem 1 g iv q 12 hours.   Will sign off and follow remotely.   Height: 4\' 11"  (149.9 cm) Weight: 40.7 kg (89 lb 11.6 oz) IBW/kg (Calculated) : 43.2  Temp (24hrs), Avg:98.1 F (36.7 C), Min:97.8 F (36.6 C), Max:98.3 F (36.8 C)  Recent Labs  Lab 04/11/21 1151 04/11/21 1313 04/11/21 1525 04/11/21 1550  WBC 20.0*  --   --   --   CREATININE  --   --   --  0.64  LATICACIDVEN  --  1.4 1.4  --     Estimated Creatinine Clearance: 34.2 mL/min (by C-G formula based on SCr of 0.64 mg/dL).    Allergies  Allergen Reactions   Tape Other (See Comments)    SKIN IS VERY THIN AND DELICATE- will tear like paper!!   Lasix [Furosemide] Other (See Comments)    Confusion- MD stopped this med   Latex Rash   Penicillins Rash    Has patient had a PCN reaction causing immediate rash, facial/tongue/throat swelling, SOB or lightheadedness with hypotension: No Has patient had a PCN reaction causing severe rash involving mucus membranes or skin necrosis: No Has patient had a PCN reaction that required hospitalization: No Has patient had a PCN reaction occurring within the last 10 years: No If all of the above answers are "NO", then may proceed with Cephalosporin use.     Thank you for allowing pharmacy to be a part of this patients care.  Napoleon Form 04/11/2021 9:18 PM

## 2021-04-11 NOTE — Progress Notes (Signed)
Pharmacy Antibiotic Note  Kayla Thompson is a 86 y.o. female admitted on 04/11/2021 presenting with constipation and abdominal pain, concern for intra-abdominal infection.  Pharmacy has been consulted for cefepime dosing.  Plan: Cefepime 2g IV q 12h Monitor renal function, Cx and clinical progression to narrow   Height: 4\' 11"  (149.9 cm) Weight: 41.9 kg (92 lb 6 oz) IBW/kg (Calculated) : 43.2  Temp (24hrs), Avg:97.8 F (36.6 C), Min:97.8 F (36.6 C), Max:97.8 F (36.6 C)  Recent Labs  Lab 04/11/21 1151 04/11/21 1313 04/11/21 1525 04/11/21 1550  WBC 20.0*  --   --   --   CREATININE  --   --   --  0.64  LATICACIDVEN  --  1.4 1.4  --     Estimated Creatinine Clearance: 35.2 mL/min (by C-G formula based on SCr of 0.64 mg/dL).    Allergies  Allergen Reactions   Tape Other (See Comments)    SKIN IS VERY THIN AND DELICATE- will tear like paper!!   Lasix [Furosemide] Other (See Comments)    Confusion- MD stopped this med   Latex Rash   Penicillins Rash    Has patient had a PCN reaction causing immediate rash, facial/tongue/throat swelling, SOB or lightheadedness with hypotension: No Has patient had a PCN reaction causing severe rash involving mucus membranes or skin necrosis: No Has patient had a PCN reaction that required hospitalization: No Has patient had a PCN reaction occurring within the last 10 years: No If all of the above answers are "NO", then may proceed with Cephalosporin use.     Bertis Ruddy, PharmD Clinical Pharmacist ED Pharmacist Phone # [phone] 04/11/2021 5:16 PM

## 2021-04-12 DIAGNOSIS — J449 Chronic obstructive pulmonary disease, unspecified: Secondary | ICD-10-CM

## 2021-04-12 DIAGNOSIS — K5641 Fecal impaction: Secondary | ICD-10-CM

## 2021-04-12 DIAGNOSIS — G9341 Metabolic encephalopathy: Secondary | ICD-10-CM

## 2021-04-12 DIAGNOSIS — E876 Hypokalemia: Secondary | ICD-10-CM

## 2021-04-12 LAB — COMPREHENSIVE METABOLIC PANEL
ALT: 16 U/L (ref 0–44)
AST: 14 U/L — ABNORMAL LOW (ref 15–41)
Albumin: 2.5 g/dL — ABNORMAL LOW (ref 3.5–5.0)
Alkaline Phosphatase: 38 U/L (ref 38–126)
Anion gap: 7 (ref 5–15)
BUN: 24 mg/dL — ABNORMAL HIGH (ref 8–23)
CO2: 23 mmol/L (ref 22–32)
Calcium: 9 mg/dL (ref 8.9–10.3)
Chloride: 106 mmol/L (ref 98–111)
Creatinine, Ser: 0.74 mg/dL (ref 0.44–1.00)
GFR, Estimated: >60 mL/min (ref >60)
Glucose, Bld: 116 mg/dL — ABNORMAL HIGH (ref 70–99)
Potassium: 3.2 mmol/L — ABNORMAL LOW (ref 3.5–5.1)
Sodium: 136 mmol/L (ref 135–145)
Total Bilirubin: 0.8 mg/dL (ref 0.3–1.2)
Total Protein: 5.3 g/dL — ABNORMAL LOW (ref 6.5–8.1)

## 2021-04-12 LAB — TSH: TSH: 0.58 u[IU]/mL (ref 0.350–4.500)

## 2021-04-12 LAB — FOLATE: Folate: 22.3 ng/mL (ref >5.9)

## 2021-04-12 LAB — VITAMIN B12: Vitamin B-12: 1665 pg/mL — ABNORMAL HIGH (ref 180–914)

## 2021-04-12 LAB — CBC
HCT: 24.8 % — ABNORMAL LOW (ref 36.0–46.0)
Hemoglobin: 8.1 g/dL — ABNORMAL LOW (ref 12.0–15.0)
MCH: 35.8 pg — ABNORMAL HIGH (ref 26.0–34.0)
MCHC: 32.7 g/dL (ref 30.0–36.0)
MCV: 109.7 fL — ABNORMAL HIGH (ref 80.0–100.0)
Platelets: 333 10*3/uL (ref 150–400)
RBC: 2.26 MIL/uL — ABNORMAL LOW (ref 3.87–5.11)
RDW: 14.2 % (ref 11.5–15.5)
WBC: 15.7 10*3/uL — ABNORMAL HIGH (ref 4.0–10.5)
nRBC: 0 % (ref 0.0–0.2)

## 2021-04-12 LAB — IRON AND TIBC
Iron: 23 ug/dL — ABNORMAL LOW (ref 28–170)
Saturation Ratios: 9 % — ABNORMAL LOW (ref 10.4–31.8)
TIBC: 246 ug/dL — ABNORMAL LOW (ref 250–450)
UIBC: 223 ug/dL

## 2021-04-12 LAB — RETICULOCYTES
Immature Retic Fract: 27 % — ABNORMAL HIGH (ref 2.3–15.9)
RBC.: 2.47 MIL/uL — ABNORMAL LOW (ref 3.87–5.11)
Retic Count, Absolute: 65.5 10*3/uL (ref 19.0–186.0)
Retic Ct Pct: 2.7 % (ref 0.4–3.1)

## 2021-04-12 LAB — MAGNESIUM: Magnesium: 1.6 mg/dL — ABNORMAL LOW (ref 1.7–2.4)

## 2021-04-12 LAB — URINE CULTURE: Culture: >=100000 — AB

## 2021-04-12 LAB — GLUCOSE, CAPILLARY
Glucose-Capillary: 116 mg/dL — ABNORMAL HIGH (ref 70–99)
Glucose-Capillary: 170 mg/dL — ABNORMAL HIGH (ref 70–99)
Glucose-Capillary: 94 mg/dL (ref 70–99)
Glucose-Capillary: 162 mg/dL — ABNORMAL HIGH (ref 70–99)

## 2021-04-12 LAB — AMMONIA: Ammonia: 12 umol/L (ref 9–35)

## 2021-04-12 LAB — FERRITIN: Ferritin: 99 ng/mL (ref 11–307)

## 2021-04-12 MED ORDER — OXYCODONE-ACETAMINOPHEN 7.5-325 MG PO TABS
1.0000 | ORAL_TABLET | Freq: Four times a day (QID) | ORAL | Status: DC | PRN
  Administered 2021-04-12 – 2021-04-14 (×5): 1 via ORAL
  Filled 2021-04-12 (×5): qty 1

## 2021-04-12 MED ORDER — POTASSIUM CHLORIDE CRYS ER 20 MEQ PO TBCR
40.0000 meq | EXTENDED_RELEASE_TABLET | ORAL | Status: AC
  Administered 2021-04-12 (×2): 40 meq via ORAL
  Filled 2021-04-12 (×2): qty 2

## 2021-04-12 MED ORDER — GERHARDT'S BUTT CREAM
TOPICAL_CREAM | Freq: Two times a day (BID) | CUTANEOUS | Status: DC
  Filled 2021-04-12 (×3): qty 1

## 2021-04-12 MED ORDER — POLYETHYLENE GLYCOL 3350 17 G PO PACK
17.0000 g | PACK | Freq: Two times a day (BID) | ORAL | Status: DC
  Administered 2021-04-12 – 2021-04-14 (×5): 17 g via ORAL
  Filled 2021-04-12 (×5): qty 1

## 2021-04-12 MED ORDER — SORBITOL 70 % SOLN
960.0000 mL | TOPICAL_OIL | Freq: Once | ORAL | Status: AC
  Administered 2021-04-12: 960 mL via RECTAL
  Filled 2021-04-12: qty 473

## 2021-04-12 NOTE — Plan of Care (Signed)
Patient will remain free from falls and injury and vital signs will remain stable throughout shfit

## 2021-04-12 NOTE — Assessment & Plan Note (Signed)
Likely secondary to infection. Appears to have cleared with treatment of infection.

## 2021-04-12 NOTE — Assessment & Plan Note (Addendum)
Patient with a chronic issue of constipation. Fecal impaction noted on imaging. Failed attempt at manual disimpaction while in the ED. Good response to enema administration. Recommendation for Miralax on discharge.

## 2021-04-12 NOTE — Progress Notes (Signed)
SMOG enema administrated, large amount of hard type 1 stool balls moved.  Patient asked if she would be willing to be transferred to bedside commode prior to enema being performed, patient refused.  Cited too much pain for refusal.  Patient tolerated enema well, resting seemingly comfortably in bed afterwards.  Angie Fava, RN

## 2021-04-12 NOTE — Assessment & Plan Note (Addendum)
Patient is on Lantus 5 units daily as an outpatient. Last hemoglobin A1C of 6.5% which is very controlled for age. May benefit from cessation of insulin therapy to minimize hypoglycemia as an outpatient

## 2021-04-12 NOTE — Assessment & Plan Note (Addendum)
Associated hypomagnesemia. Resolved with repletion.

## 2021-04-12 NOTE — Assessment & Plan Note (Addendum)
Continue home prednisone, Cymbalta, gabapentin and Percocet

## 2021-04-12 NOTE — Progress Notes (Signed)
PROGRESS NOTE    [name]  MWN:[id number] DOB: [birth year] DOA: 04/11/2021 PCP: [name], MD   Brief Narrative: Kayla Thompson is a 86 y.o. female with a history of polymyalgia rheumatica on prednisone, diabetes mellitus type 2, COPD, diastolic CHF, chronic leukocytosis, macrocytic anemia, history of ovarian cancer status post hysterectomy and oophorectomy. Patient presented secondary to confusion and abdominal discomfort with evidence of sepsis secondary to UTI. She was also found to be COVID-19 positive without symptoms or signs of active disease. Empiric antibiotics started. Blood and urine cultures obtained.   Assessment & Plan:   * Sepsis ([place])- (present on admission) Present on admission. Concern for UTI but patient is also COVID test positive. Blood and urine cultures obtained on admission. Initially started on Cefepime and Flagyl, transitioned to meropenem secondary to history of ESBL infection.  Acute lower UTI- (present on admission) Urinalysis suggests pyuria, but no mention of bacteria. Cystitis suggested on imaging. Patient admitted with evidence of sepsis. History of ESBL infection. -Continue meropenem  PMR (polymyalgia rheumatica) ([facility])- (present on admission) -Continue home prednisone, Cymbalta, gabapentin and Percocet  Hypothyroidism- (present on admission) -Continue Synthroid  Hypomagnesemia -Magnesium supplementation  Hypokalemia Associated hypomagnesemia -Potassium supplementation  Fecal impaction ([place]) Patient with a chronic issue of constipation. Fecal impaction noted on imaging. Failed attempt at manual disimpaction while in the ED. -Miralax -SMOG enema  Acute metabolic encephalopathy- (present on admission) Likely secondary to infection. Appears to have cleared with treatment of infection.  Chronic obstructive pulmonary disease ([place])- (present on admission) Patient uses Trelegy ellipta as an outpatient. -Continue Incruse ellipta  (substituted for Trelegy) -Continue albuterol prn  DM2 (diabetes mellitus, type 2) ([place]) Patient is on Lantus 5 units daily as an outpatient. Last hemoglobin A1C of 6.5% which is very controlled for age. -Continue Semglee and SSI -May benefit from cessation of insulin therapy to minimize hypoglycemia as an outpatient     DVT prophylaxis: Lovenox Code Status:   Code Status: DNR Family Communication: Daughter on telephone Disposition Plan: Discharge home vs SNF pending transition to outpatient antibiotic regimen, PT/OT recommendations   Consultants:  None  Procedures:  None  Antimicrobials: Meropenem Cefepime Flagyl    Subjective: Patient reports no concerns this morning. No issues overnight. Per nursing patient has had some chronic neck and shoulder pain.  Objective: Vitals:   04/11/21 2005 04/11/21 2354 04/12/21 0430 04/12/21 0739  BP: (!) 100/49 111/62 126/69 119/70  Pulse: (!) 110 (!) 101 88 85  Resp:  18 19 16   Temp: 98.3 F (36.8 C) 98.4 F (36.9 C) 98.1 F (36.7 C) 98.2 F (36.8 C)  TempSrc: Oral Oral Oral Oral  SpO2: 98% 97% 99% 97%  Weight: 40.7 kg     Height: 4\' 11"  (1.499 m)       Intake/Output Summary (Last 24 hours) at 04/12/2021 1017 Last data filed at 04/12/2021 4034 Gross per 24 hour  Intake 608.67 ml  Output --  Net 608.67 ml   Filed Weights   04/11/21 1140 04/11/21 2005  Weight: 41.9 kg 40.7 kg    Examination:  General exam: Appears calm and comfortable Respiratory system: Diminished but clear on anterior auscultation. Respiratory effort normal. Cardiovascular system: S1 & S2 heard, RRR. Systolic murmur Gastrointestinal system: Abdomen is nondistended, soft and nontender. No organomegaly or masses felt. Normal bowel sounds heard. Central nervous system: Alert and oriented to self only. No focal neurological deficits. Musculoskeletal: No edema. No calf tenderness Skin: No cyanosis. No rashes Psychiatry: Judgement  and insight appear  normal. Mood & affect appropriate.     Data Reviewed: I have personally reviewed following labs and imaging studies  CBC Lab Results  Component Value Date   WBC 15.7 (H) 04/12/2021   RBC 2.47 (L) 04/12/2021   HGB 8.1 (L) 04/12/2021   HCT 24.8 (L) 04/12/2021   MCV 109.7 (H) 04/12/2021   MCH 35.8 (H) 04/12/2021   PLT 333 04/12/2021   MCHC 32.7 04/12/2021   RDW 14.2 04/12/2021   LYMPHSABS 3.2 03/26/2021   MONOABS 0.9 03/26/2021   EOSABS 0.2 03/26/2021   BASOSABS 0.1 82/95/6213     Last metabolic panel Lab Results  Component Value Date   NA 136 04/12/2021   K 3.2 (L) 04/12/2021   CL 106 04/12/2021   CO2 23 04/12/2021   BUN 24 (H) 04/12/2021   CREATININE 0.74 04/12/2021   GLUCOSE 116 (H) 04/12/2021   GFRNONAA >60 04/12/2021   GFRAA 72 01/08/2020   CALCIUM 9.0 04/12/2021   PHOS 3.8 11/08/2014   PROT 5.3 (L) 04/12/2021   ALBUMIN 2.5 (L) 04/12/2021   BILITOT 0.8 04/12/2021   ALKPHOS 38 04/12/2021   AST 14 (L) 04/12/2021   ALT 16 04/12/2021   ANIONGAP 7 04/12/2021    CBG (last 3)  Recent Labs    04/11/21 2128 04/12/21 0735  GLUCAP 162* 116*     GFR: Estimated Creatinine Clearance: 34.2 mL/min (by C-G formula based on SCr of 0.74 mg/dL).  Coagulation Profile: Recent Labs  Lab 04/11/21 1310  INR 1.0    Recent Results (from the past 240 hour(s))  Blood culture (routine x 2)     Status: None (Preliminary result)   Collection Time: 04/11/21  1:10 PM   Specimen: BLOOD  Result Value Ref Range Status   Specimen Description   Final    BLOOD BLOOD LEFT FOREARM Performed at [facility], [address], [place], [place] [address]    Special Requests   Final    BOTTLES DRAWN AEROBIC AND ANAEROBIC Blood Culture adequate volume Performed at [facility], [address], [place], [place] [address]    Culture   Final    NO GROWTH < 24 HOURS Performed at [facility], [place] [address] [place], [place] [address]     Report Status PENDING  Incomplete  Resp Panel by RT-PCR (Flu A&B, Covid) Nasopharyngeal Swab     Status: Abnormal   Collection Time: 04/11/21  1:25 PM   Specimen: Nasopharyngeal Swab; Nasopharyngeal(NP) swabs in vial transport medium  Result Value Ref Range Status   SARS Coronavirus 2 by RT PCR POSITIVE (A) NEGATIVE Final    Comment: (NOTE) SARS-CoV-2 target nucleic acids are DETECTED.  The SARS-CoV-2 RNA is generally detectable in upper respiratory specimens during the acute phase of infection. Positive results are indicative of the presence of the identified virus, but do not rule out bacterial infection or co-infection with other pathogens not detected by the test. Clinical correlation with patient history and other diagnostic information is necessary to determine patient infection status. The expected result is Negative.  Fact Sheet for Patients: EntrepreneurPulse.com.au  Fact Sheet for Healthcare Providers: IncredibleEmployment.be  This test is not yet approved or cleared by the [place] FDA and  has been authorized for detection and/or diagnosis of SARS-CoV-2 by FDA under an Emergency Use Authorization (EUA).  This EUA will remain in effect (meaning this test can be used) for the duration of  the COVID-19 declaration under Section 564(b)(1)  of the A ct, 21 U.S.C. section 360bbb-3(b)(1), unless the authorization is terminated or revoked sooner.     Influenza A by PCR NEGATIVE NEGATIVE Final   Influenza B by PCR NEGATIVE NEGATIVE Final    Comment: (NOTE) The Xpert Xpress SARS-CoV-2/FLU/RSV plus assay is intended as an aid in the diagnosis of influenza from Nasopharyngeal swab specimens and should not be used as a sole basis for treatment. Nasal washings and aspirates are unacceptable for Xpert Xpress SARS-CoV-2/FLU/RSV testing.  Fact Sheet for Patients: EntrepreneurPulse.com.au  Fact Sheet for Healthcare  Providers: IncredibleEmployment.be  This test is not yet approved or cleared by the [place] FDA and has been authorized for detection and/or diagnosis of SARS-CoV-2 by FDA under an Emergency Use Authorization (EUA). This EUA will remain in effect (meaning this test can be used) for the duration of the COVID-19 declaration under Section 564(b)(1) of the Act, 21 U.S.C. section 360bbb-3(b)(1), unless the authorization is terminated or revoked.  Performed at [facility], [address] [place], [place] [address]   Blood culture (routine x 2)     Status: None (Preliminary result)   Collection Time: 04/11/21  1:35 PM   Specimen: BLOOD  Result Value Ref Range Status   Specimen Description   Final    BLOOD LEFT WRIST Performed at [facility], [address], [place], [place] [address]    Special Requests   Final    BOTTLES DRAWN AEROBIC AND ANAEROBIC Blood Culture adequate volume Performed at [facility], [address] [place], [place] [address]    Culture   Final    NO GROWTH < 24 HOURS Performed at [facility] [address], [place], [place] [address]    Report Status PENDING  Incomplete        Radiology Studies: CT HEAD WO CONTRAST (5MM)  Result Date: 04/11/2021 CLINICAL DATA:  Altered mental status. EXAM: CT HEAD WITHOUT CONTRAST TECHNIQUE: Contiguous axial images were obtained from the base of the skull through the vertex without intravenous contrast. RADIATION DOSE REDUCTION: This exam was performed according to the departmental dose-optimization program which includes automated exposure control, adjustment of the mA and/or kV according to patient size and/or use of iterative reconstruction technique. COMPARISON:  Head CT dated 03/27/2021. FINDINGS: Brain: There is mild age-related atrophy and moderate chronic microvascular ischemic changes. There is no acute intracranial  hemorrhage. No mass effect or midline shift. No extra-axial fluid collection. Vascular: No hyperdense vessel or unexpected calcification. Skull: Normal. Negative for fracture or focal lesion. Sinuses/Orbits: No acute finding. Other: None IMPRESSION: 1. No acute intracranial pathology. 2. Age-related atrophy and chronic microvascular ischemic changes. Electronically Signed   By: Anner Crete M.D.   On: 04/11/2021 23:48   CT Abdomen Pelvis W Contrast  Result Date: 04/11/2021 CLINICAL DATA:  Constipation.  Bowel obstruction suspected. EXAM: CT ABDOMEN AND PELVIS WITH CONTRAST TECHNIQUE: Multidetector CT imaging of the abdomen and pelvis was performed using the standard protocol following bolus administration of intravenous contrast. RADIATION DOSE REDUCTION: This exam was performed according to the departmental dose-optimization program which includes automated exposure control, adjustment of the mA and/or kV according to patient size and/or use of iterative reconstruction technique. CONTRAST:  18mL OMNIPAQUE IOHEXOL 300 MG/ML  SOLN COMPARISON:  01/10/2021 FINDINGS: Lower chest: Similar appearance architectural distortion and scarring in the right lower lobe with some peripheral tree-in-bud nodularity in the lower lungs bilaterally, similar to prior and suggesting atypical infection. 6 mm left lower lobe nodule  on image 12/series 6 is stable and also unchanged comparing back to 01/25/2019. Hepatobiliary: No suspicious focal abnormality within the liver parenchyma. Gallbladder is distended. No intrahepatic or extrahepatic biliary dilation. Pancreas: No focal mass lesion. No dilatation of the main duct. No intraparenchymal cyst. No peripancreatic edema. Spleen: No splenomegaly. No focal mass lesion. Adrenals/Urinary Tract: No adrenal nodule or mass. Kidneys unremarkable. No evidence for hydroureter. Diffuse irregular bladder wall thickening noted with some mucosal hyperenhancement posteriorly. Stomach/Bowel:  Stomach is nondistended. Duodenum is normally positioned as is the ligament of Treitz. No small bowel wall thickening. No small bowel dilatation. The terminal ileum is normal. The appendix is normal. Prominent stool volume noted with large volume formed stool in the sigmoid colon. Rectal vault is stool-filled measuring 6.9 x 7.7 cm diameter on axial imaging. Vascular/Lymphatic: There is moderate atherosclerotic calcification of the abdominal aorta without aneurysm. There is no gastrohepatic or hepatoduodenal ligament lymphadenopathy. No retroperitoneal or mesenteric lymphadenopathy. No pelvic sidewall lymphadenopathy. Surgical clips in the retroperitoneal space and pelvic sidewall suggest prior lymph node dissection. Reproductive: Uterus is surgically absent. There is no adnexal mass. Other: No intraperitoneal free fluid. Musculoskeletal: Mild presacral edema evident, possibly related to the rectal distension. Status post right hip replacement chronic posttraumatic deformity noted both superior and inferior pubic rami. Nonacute posterior right rib fractures evident. IMPRESSION: 1. Prominent stool volume with large volume formed stool in the sigmoid colon. Rectal vault is stool-filled measuring 6.9 x 7.7 cm diameter on axial imaging. Imaging features raise concern for fecal impaction. 2. Diffuse irregular bladder wall thickening with some mucosal hyperenhancement posteriorly. Cystitis could have this appearance. 3. Similar appearance architectural distortion and scarring in the right lower lobe with some peripheral tree-in-bud nodularity in the lower lungs bilaterally, similar to prior and suggesting sequelae of atypical infection. 4. Stable 6 mm left lower lobe pulmonary nodule comparing back to 01/25/2019 consistent with benign etiology. 5. Aortic Atherosclerosis (ICD10-I70.0). Electronically Signed   By: Misty Stanley M.D.   On: 04/11/2021 15:48   DG Chest Port 1 View  Result Date: 04/11/2021 CLINICAL DATA:   Possible sepsis. EXAM: PORTABLE CHEST 1 VIEW COMPARISON:  03/26/2021 FINDINGS: 1527 hours. The lungs are clear without focal pneumonia, edema, pneumothorax or pleural effusion. Interstitial markings are diffusely coarsened with chronic features. The cardiopericardial silhouette is within normal limits for size. Advanced degenerative changes noted in both shoulders. Telemetry leads overlie the chest. IMPRESSION: Chronic interstitial coarsening without acute cardiopulmonary findings. Electronically Signed   By: Misty Stanley M.D.   On: 04/11/2021 15:37   DG Abd Portable 1 View  Result Date: 04/11/2021 CLINICAL DATA:  Constipation. EXAM: PORTABLE ABDOMEN - 1 VIEW COMPARISON:  01/11/2021 FINDINGS: A moderate to large stool burden is identified within the descending colon, sigmoid colon and rectum. No dilated loops of small bowel identified to suggest obstruction. Previous ORIF of the right femur. Surgical clips are identified within the lower abdomen in both sides of pelvis. IMPRESSION: Moderate to large stool burden within the descending colon, sigmoid colon and rectum compatible with constipation. Electronically Signed   By: Kerby Moors M.D.   On: 04/11/2021 12:57        Scheduled Meds:  baclofen  10 mg Oral QHS   docusate sodium  100 mg Oral Daily   DULoxetine  30 mg Oral q AM   DULoxetine  60 mg Oral q AM   enoxaparin (LOVENOX) injection  30 mg Subcutaneous Q24H   fluticasone furoate-vilanterol  1 puff Inhalation Daily  And   umeclidinium bromide  1 puff Inhalation Daily   gabapentin  100 mg Oral QHS   Gerhardt's butt cream   Topical BID   insulin aspart  0-9 Units Subcutaneous TID WC   insulin glargine-yfgn  5 Units Subcutaneous q AM   levothyroxine  50 mcg Oral Q0600   linaclotide  72 mcg Oral QAC breakfast   mirtazapine  15 mg Oral QHS   montelukast  10 mg Oral Daily   pantoprazole  40 mg Oral Daily   polyethylene glycol  17 g Oral BID   potassium chloride  40 mEq Oral Q4H    predniSONE  2 mg Oral Q breakfast   rosuvastatin  10 mg Oral QPM   [START ON 04/16/2021] vitamin B-12  2,500 mcg Oral Q Thu   Continuous Infusions:  lactated ringers 75 mL/hr at 04/11/21 2200   meropenem (MERREM) IV 1 g (04/12/21 0939)     LOS: 1 day     Cordelia Poche, MD [facility] 04/12/2021, 10:17 AM  If 7PM-7AM, please contact night-coverage [email or web address]

## 2021-04-12 NOTE — Assessment & Plan Note (Addendum)
Urinalysis suggests pyuria, but no mention of bacteria. Cystitis suggested on imaging. Patient admitted with evidence of sepsis. History of ESBL infection so patient was managed on empiric meropenem. Urine culture significant for yeast, so meropenem was discontinued and patient was started on fluconazole by mouth. Discharged to continue a 2 week course for treatment.

## 2021-04-12 NOTE — Assessment & Plan Note (Addendum)
Patient uses Trelegy ellipta as an outpatient. Continue.

## 2021-04-12 NOTE — Assessment & Plan Note (Addendum)
Present on admission. Concern for UTI but patient is also COVID test positive. Blood and urine cultures obtained on admission. Initially started on Cefepime and Flagyl, transitioned to meropenem secondary to history of ESBL infection. Yeast on urine culture. Otherwise, see problem, Yeast cystitis

## 2021-04-12 NOTE — Hospital Course (Addendum)
Kayla Thompson is a 86 y.o. female with a history of polymyalgia rheumatica on prednisone, diabetes mellitus type 2, COPD, diastolic CHF, chronic leukocytosis, macrocytic anemia, history of ovarian cancer status post hysterectomy and oophorectomy. Patient presented secondary to confusion and abdominal discomfort with evidence of sepsis secondary to UTI. She was also found to be COVID-19 positive without symptoms or signs of active disease. Empiric antibiotics started. Blood and urine cultures obtained. Urine culture significant for yeast. Patient transitioned to fluconazole.

## 2021-04-12 NOTE — Assessment & Plan Note (Addendum)
Continue Synthroid °

## 2021-04-12 NOTE — Assessment & Plan Note (Addendum)
Given Magnesium supplementation

## 2021-04-13 ENCOUNTER — Telehealth: Payer: Self-pay

## 2021-04-13 ENCOUNTER — Other Ambulatory Visit: Payer: Self-pay

## 2021-04-13 ENCOUNTER — Ambulatory Visit: Payer: Medicare Other | Admitting: Internal Medicine

## 2021-04-13 ENCOUNTER — Telehealth: Payer: Medicare Other | Admitting: Internal Medicine

## 2021-04-13 LAB — CBC
HCT: 29.1 % — ABNORMAL LOW (ref 36.0–46.0)
Hemoglobin: 9.5 g/dL — ABNORMAL LOW (ref 12.0–15.0)
MCH: 36.1 pg — ABNORMAL HIGH (ref 26.0–34.0)
MCHC: 32.6 g/dL (ref 30.0–36.0)
MCV: 110.6 fL — ABNORMAL HIGH (ref 80.0–100.0)
Platelets: 314 10*3/uL (ref 150–400)
RBC: 2.63 MIL/uL — ABNORMAL LOW (ref 3.87–5.11)
RDW: 14.3 % (ref 11.5–15.5)
WBC: 15.5 10*3/uL — ABNORMAL HIGH (ref 4.0–10.5)
nRBC: 0 % (ref 0.0–0.2)

## 2021-04-13 LAB — BASIC METABOLIC PANEL
Anion gap: 7 (ref 5–15)
BUN: 17 mg/dL (ref 8–23)
CO2: 25 mmol/L (ref 22–32)
Calcium: 9.1 mg/dL (ref 8.9–10.3)
Chloride: 103 mmol/L (ref 98–111)
Creatinine, Ser: 0.65 mg/dL (ref 0.44–1.00)
GFR, Estimated: >60 mL/min (ref >60)
Glucose, Bld: 155 mg/dL — ABNORMAL HIGH (ref 70–99)
Potassium: 4 mmol/L (ref 3.5–5.1)
Sodium: 135 mmol/L (ref 135–145)

## 2021-04-13 LAB — GLUCOSE, CAPILLARY
Glucose-Capillary: 108 mg/dL — ABNORMAL HIGH (ref 70–99)
Glucose-Capillary: 129 mg/dL — ABNORMAL HIGH (ref 70–99)
Glucose-Capillary: 150 mg/dL — ABNORMAL HIGH (ref 70–99)
Glucose-Capillary: 117 mg/dL — ABNORMAL HIGH (ref 70–99)

## 2021-04-13 LAB — RPR: RPR Ser Ql: NONREACTIVE

## 2021-04-13 MED ORDER — FLUCONAZOLE 100 MG PO TABS
200.0000 mg | ORAL_TABLET | Freq: Every day | ORAL | Status: DC
  Administered 2021-04-13 – 2021-04-14 (×2): 200 mg via ORAL
  Filled 2021-04-13 (×2): qty 2

## 2021-04-13 MED ORDER — BOOST / RESOURCE BREEZE PO LIQD CUSTOM
1.0000 | Freq: Two times a day (BID) | ORAL | Status: DC
  Administered 2021-04-13: 1 via ORAL

## 2021-04-13 MED ORDER — ENSURE ENLIVE PO LIQD
237.0000 mL | Freq: Two times a day (BID) | ORAL | Status: DC
  Administered 2021-04-13 – 2021-04-14 (×2): 237 mL via ORAL

## 2021-04-13 MED ORDER — ADULT MULTIVITAMIN W/MINERALS CH
1.0000 | ORAL_TABLET | Freq: Every day | ORAL | Status: DC
  Administered 2021-04-13 – 2021-04-14 (×2): 1 via ORAL
  Filled 2021-04-13 (×2): qty 1

## 2021-04-13 NOTE — Evaluation (Signed)
Physical Therapy Evaluation Patient Details Name: Kayla Thompson MRN: [id number] DOB: September 08, 1935 Today's Date: 04/13/2021  History of Present Illness  Kayla Thompson is a 86 y.o. female who presented with increased confusion and abdominal discomfort. Patient admitted 04/11/20 with sepsis, UTI.   PMH: recent COVID 19 admission. CHF, HTN, PAD, frequent UTIs, asthma, diabetes, hyperlipidemia, hypothyroidism, depression, anxiety, fibromyalgia, osteoporosis, chronic pain, GERD, history of ovarian cancer s/p TAH/BSO  Clinical Impression  Pt admitted with above diagnosis.  Pt currently with functional limitations due to the deficits listed below (see PT Problem List). Pt will benefit from skilled PT to increase their independence and safety with mobility to allow discharge to the venue listed below.  Pt with recent admission and discharged home with family and caregiver assist.  Pt reports she was receiving HHPT and was ambulating at home prior to this admission.  Pt assisted with standing and ambulating short distance in room.  Pt plans to return home upon d/c.      Recommendations for follow up therapy are one component of a multi-disciplinary discharge planning process, led by the attending physician.  Recommendations may be updated based on patient status, additional functional criteria and insurance authorization.  Follow Up Recommendations Home health PT    Assistance Recommended at Discharge Frequent or constant Supervision/Assistance  Patient can return home with the following  A lot of help with walking and/or transfers;A lot of help with bathing/dressing/bathroom;Assistance with feeding;Assistance with cooking/housework    Equipment Recommendations None recommended by PT  Recommendations for Other Services       Functional Status Assessment Patient has had a recent decline in their functional status and demonstrates the ability to make significant improvements in function in a reasonable  and predictable amount of time.     Precautions / Restrictions Precautions Precautions: Fall Restrictions Weight Bearing Restrictions: No      Mobility  Bed Mobility               General bed mobility comments: pt in recliner    Transfers Overall transfer level: Needs assistance Equipment used: Rolling walker (2 wheels) Transfers: Sit to/from Stand Sit to Stand: Min assist           General transfer comment: pt with good hand placement using armrests to self assist, min assist to rise and steady due to retropulsion    Ambulation/Gait Ambulation/Gait assistance: Min guard, Min assist Gait Distance (Feet): 12 Feet (x2) Assistive device: Rolling walker (2 wheels) Gait Pattern/deviations: Step-through pattern, Decreased stride length       General Gait Details: initially min assist however improved to min/guard, pt performed 12 ft x2 with seated reast break  Stairs            Wheelchair Mobility    Modified Rankin (Stroke Patients Only)       Balance Overall balance assessment: Needs assistance         Standing balance support: Reliant on assistive device for balance, Bilateral upper extremity supported Standing balance-Leahy Scale: Poor                               Pertinent Vitals/Pain Pain Assessment Pain Assessment: No/denies pain Pain Intervention(s): Monitored during session, Repositioned    Home Living Family/patient expects to be discharged to:: Private residence Living Arrangements: Children;Other (Comment) Available Help at Discharge: Family;Personal care attendant;Available 24 hours/day Type of Home: House Home Access: Stairs to enter Entrance Stairs-Rails: None  Entrance Stairs-Number of Steps: 1 full step to a patio, then threshold   Home Layout: One level Home Equipment: Conservation officer, nature (2 wheels);Rollator (4 wheels);Wheelchair - manual;Grab bars - tub/shower;Hand held shower head;Shower seat      Prior  Function Prior Level of Function : Needs assist  Cognitive Assist : Mobility (cognitive);ADLs (cognitive) Mobility (Cognitive): Intermittent cues ADLs (Cognitive): Intermittent cues Physical Assist : ADLs (physical)   ADLs (physical): Bathing;Dressing;IADLs Mobility Comments: patient was able to use rollator in house for functional mobility short distances ADLs Comments: patient was able to complete toileting tasks with caregivers present. patient does not complete bathing or dressing tasks herself. bilateral shoulder ROM issues impact participation in all tasks. albe to self feed. d/c from hospital one week prior to this admission patient was recommended to transition home with 24/7 caregivers.     Hand Dominance   Dominant Hand: Right    Extremity/Trunk Assessment   Upper Extremity Assessment RUE Deficits / Details: patient has no active ROM of shoulder. patient declined PROM with reports of pain with movement. h/o rotator cuff teards bilaterally LUE Deficits / Details: patient has no active ROM of shoulder. patient declined PROM with reports of pain with movement. daughter reported history of rotator cuff tears bilaterally. noted to have audible crepitus with all movement of all joints and trunk    Lower Extremity Assessment Lower Extremity Assessment: Generalized weakness    Cervical / Trunk Assessment Cervical / Trunk Assessment: Kyphotic  Communication   Communication: Other (comment);HOH  Cognition Arousal/Alertness: Awake/alert Behavior During Therapy: Flat affect Overall Cognitive Status: History of cognitive impairments - at baseline                                 General Comments: patient was able to follow one step commands with increased time and redirection        General Comments      Exercises     Assessment/Plan    PT Assessment Patient needs continued PT services  PT Problem List Decreased strength;Decreased activity tolerance;Decreased  balance;Decreased mobility;Decreased cognition       PT Treatment Interventions DME instruction;Gait training;Functional mobility training;Therapeutic activities;Therapeutic exercise;Balance training;Patient/family education    PT Goals (Current goals can be found in the Care Plan section)  Acute Rehab PT Goals Patient Stated Goal: home PT Goal Formulation: With patient Time For Goal Achievement: 04/27/21 Potential to Achieve Goals: Fair    Frequency Min 2X/week     Co-evaluation               AM-PAC PT "6 Clicks" Mobility  Outcome Measure Help needed turning from your back to your side while in a flat bed without using bedrails?: A Lot Help needed moving from lying on your back to sitting on the side of a flat bed without using bedrails?: A Lot Help needed moving to and from a bed to a chair (including a wheelchair)?: A Lot Help needed standing up from a chair using your arms (e.g., wheelchair or bedside chair)?: A Lot Help needed to walk in hospital room?: A Lot Help needed climbing 3-5 steps with a railing? : Total 6 Click Score: 11    End of Session Equipment Utilized During Treatment: Gait belt Activity Tolerance: Patient tolerated treatment well Patient left: in chair;with call bell/phone within reach;with chair alarm set   PT Visit Diagnosis: Other abnormalities of gait and mobility (R26.89);Muscle weakness (generalized) (M62.81)  Time: 5945-8592 PT Time Calculation (min) (ACUTE ONLY): 29 min   Charges:   PT Evaluation $PT Eval Low Complexity: 1 Low PT Treatments $Gait Training: 8-22 mins      Jannette Spanner PT, DPT Acute Rehabilitation Services Pager: [phone] Office: [place] 04/13/2021, 4:38 PM

## 2021-04-13 NOTE — Evaluation (Signed)
Occupational Therapy Evaluation Patient Details Name: Kayla Thompson MRN: [id number] DOB: 1935/09/22 Today's Date: 04/13/2021   History of Present Illness Kayla Thompson is a 86 y.o. female who presented with increased confusion and abdominal discomfort. patient was found to have sepsis with UTI.   PMH: recent COVID 19 admission. CHF, HTN, PAD, frequent UTIs, asthma, diabetes, hyperlipidemia, hypothyroidism, depression, anxiety, fibromyalgia, osteoporosis, chronic pain, GERD, history of ovarian cancer s/p TAH/BSO   Clinical Impression   Patient is a 86 year old female who was recently at this hospital with COVID 19 infection. Currently, patient is max A for bed mobility with increased time and cues for proper hand and foot positioning. Patient was noted to have some confusion but was intrinsically motivated to get out of bed on this date. Patient was noted to have decreased activity tolerance, decreased sitting balance, decreased safety awareness, decreased endurance, increased need for supplemental O2 impacting participation in ADLs. Patient would continue to benefit from skilled OT services at this time while admitted and after d/c to address noted deficits in order to improve overall safety and independence in ADLs.       Recommendations for follow up therapy are one component of a multi-disciplinary discharge planning process, led by the attending physician.  Recommendations may be updated based on patient status, additional functional criteria and insurance authorization.   Follow Up Recommendations  Home health OT    Assistance Recommended at Discharge Frequent or constant Supervision/Assistance  Patient can return home with the following Two people to help with walking and/or transfers;Two people to help with bathing/dressing/bathroom;Direct supervision/assist for medications management;Help with stairs or ramp for entrance;Assist for transportation;Direct supervision/assist for financial  management    Functional Status Assessment     Equipment Recommendations  None recommended by OT    Recommendations for Other Services       Precautions / Restrictions Precautions Precautions: Fall Restrictions Weight Bearing Restrictions: No      Mobility Bed Mobility Overal bed mobility: Needs Assistance Bed Mobility: Supine to Sit     Supine to sit: Max assist, HOB elevated     General bed mobility comments: patient needed increased time and physical assistance to bring trunk into upright positioning and BLE over edge of bed.    Transfers                          Balance Overall balance assessment: Needs assistance Sitting-balance support: Feet supported, Bilateral upper extremity supported Sitting balance-Leahy Scale: Poor     Standing balance support: Reliant on assistive device for balance, Bilateral upper extremity supported                               ADL either performed or assessed with clinical judgement   ADL Overall ADL's : Needs assistance/impaired Eating/Feeding: Minimal assistance;Sitting Eating/Feeding Details (indicate cue type and reason): in recliner Grooming: Wash/dry face;Sitting;Minimal assistance   Upper Body Bathing: Maximal assistance;Sitting Upper Body Bathing Details (indicate cue type and reason): at baseline Lower Body Bathing: Maximal assistance;Bed level Lower Body Bathing Details (indicate cue type and reason): at baseline Upper Body Dressing : Bed level;Maximal assistance Upper Body Dressing Details (indicate cue type and reason): at baseline Lower Body Dressing: Maximal assistance;Bed level Lower Body Dressing Details (indicate cue type and reason): at baseline Toilet Transfer: Moderate assistance;+2 for safety/equipment;+2 for physical assistance Toilet Transfer Details (indicate cue type and reason):  with RW to recliner in room to simulate transfer [place] and Hygiene:  Maximal assistance;Sit to/from stand       Functional mobility during ADLs: +2 for physical assistance;+2 for safety/equipment;Rolling walker (2 wheels)       Vision Baseline Vision/History: 1 Wears glasses Ability to See in Adequate Light: 2 Moderately impaired Patient Visual Report: No change from baseline       Perception     Praxis      Pertinent Vitals/Pain Pain Assessment Pain Assessment: Faces Faces Pain Scale: Hurts a little bit Pain Location: "arms" with bed mobility Pain Descriptors / Indicators: Grimacing, Guarding Pain Intervention(s): Limited activity within patient's tolerance, Monitored during session, Repositioned     Hand Dominance Right   Extremity/Trunk Assessment Upper Extremity Assessment RUE Deficits / Details: patient has no active ROM of shoulder. patient declined PROM with reports of pain with movement. h/o rotator cuff teards bilaterally LUE Deficits / Details: patient has no active ROM of shoulder. patient declined PROM with reports of pain with movement. daughter reported history of rotator cuff tears bilaterally. noted to have audible crepitus with all movement of all joints and trunk   Lower Extremity Assessment Lower Extremity Assessment: Defer to PT evaluation   Cervical / Trunk Assessment Cervical / Trunk Assessment: Kyphotic   Communication Communication Communication: Other (comment);HOH   Cognition Arousal/Alertness: Awake/alert Behavior During Therapy: Flat affect Overall Cognitive Status: History of cognitive impairments - at baseline                                 General Comments: patient was able to follow one step commands with increased time and redirection     General Comments       Exercises     Shoulder Instructions      Home Living Family/patient expects to be discharged to:: Private residence Living Arrangements: Children;Other (Comment) Available Help at Discharge: Family;Personal care  attendant;Available 24 hours/day Type of Home: House Home Access: Stairs to enter [employer] of Steps: 1 full step to a patio, then threshold Entrance Stairs-Rails: None Home Layout: One level     Bathroom Shower/Tub: Occupational psychologist: Standard Bathroom Accessibility: Yes How Accessible: Accessible via walker Home Equipment: Rolling Walker (2 wheels);Rollator (4 wheels);Wheelchair - manual;Grab bars - tub/shower;Hand held shower head;Shower seat          Prior Functioning/Environment Prior Level of Function : Needs assist  Cognitive Assist : Mobility (cognitive);ADLs (cognitive) Mobility (Cognitive): Intermittent cues ADLs (Cognitive): Intermittent cues Physical Assist : ADLs (physical)   ADLs (physical): Bathing;Dressing;IADLs Mobility Comments: patient was able to use rollator in house for functional mobility short distances ADLs Comments: patient was able to complete toileting tasks with caregivers present. patient does not complete bathing or dressing tasks herself. bilateral shoulder ROM issues impact participation in all tasks. albe to self feed. d/c from hospital one week prior to this admission patient was recommended to transition home with 24/7 caregivers.        OT Problem List: Decreased strength;Decreased activity tolerance;Impaired balance (sitting and/or standing);Decreased safety awareness;Decreased cognition;Cardiopulmonary status limiting activity;Impaired UE functional use;Decreased knowledge of precautions;Decreased knowledge of use of DME or AE      OT Treatment/Interventions: Self-care/ADL training;Neuromuscular education;Energy conservation;DME and/or AE instruction;Therapeutic activities;Balance training;Patient/family education    OT Goals(Current goals can be found in the care plan section) Acute Rehab OT Goals Patient Stated Goal: to get into chair  OT Goal Formulation: With patient Time For Goal Achievement:  04/11/21 Potential to Achieve Goals: Fair  OT Frequency: Min 2X/week    Co-evaluation              AM-PAC OT "6 Clicks" Daily Activity     Outcome Measure Help from another person eating meals?: A Little Help from another person taking care of personal grooming?: A Little Help from another person toileting, which includes using toliet, bedpan, or urinal?: A Lot Help from another person bathing (including washing, rinsing, drying)?: A Lot Help from another person to put on and taking off regular upper body clothing?: A Lot Help from another person to put on and taking off regular lower body clothing?: A Lot 6 Click Score: 14   End of Session Equipment Utilized During Treatment: Gait belt;Rolling walker (2 wheels) Nurse Communication: Mobility status  Activity Tolerance: Patient tolerated treatment well Patient left: in chair;with call bell/phone within reach;with chair alarm set;with nursing/sitter in room  OT Visit Diagnosis: Unsteadiness on feet (R26.81);Other abnormalities of gait and mobility (R26.89);Muscle weakness (generalized) (M62.81);History of falling (Z91.81)                Time: 1254-1316 OT Time Calculation (min): 22 min Charges:  OT General Charges $OT Visit: 1 Visit OT Evaluation $OT Eval Low Complexity: 1 Low  Leota Sauers, MS Acute Rehabilitation Department Office# [phone] Pager# [phone]   Marcellina Millin 04/13/2021, 3:54 PM

## 2021-04-13 NOTE — TOC Initial Note (Signed)
Transition of Care [facility]) - Initial/Assessment Note    Patient Details  Name: Kayla Thompson MRN: [id number] Date of Birth: 16-Dec-1935  Transition of [facility]) CM/SW Contact:    Leeroy Cha, RN Phone Number: 04/13/2021, 9:53 AM  Clinical Narrative:                  Transition of Care [facility]) Screening Note   Patient Details  Name: Kayla Thompson Date of Birth: April 09, 1935   Transition of Care [facility]) CM/SW Contact:    Leeroy Cha, RN Phone Number: 04/13/2021, 9:53 AM    Transition of Care Department [facility]) has reviewed patient and no TOC needs have been identified at this time. We will continue to monitor patient advancement through interdisciplinary progression rounds. If new patient transition needs arise, please place a TOC consult.    Expected Discharge Plan: Home/Self Care Barriers to Discharge: Continued Medical Work up   Patient Goals and CMS Choice Patient states their goals for this hospitalization and ongoing recovery are:: to go back home CMS Medicare.gov Compare Post Acute Care list provided to:: Patient    Expected Discharge Plan and Services Expected Discharge Plan: Home/Self Care   Discharge Planning Services: CM Consult   Living arrangements for the past 2 months: Single Family Home                                      Prior Living Arrangements/Services Living arrangements for the past 2 months: Single Family Home Lives with:: Self Patient language and need for interpreter reviewed:: Yes Do you feel safe going back to the place where you live?: Yes          Current home services: Sitter, Home PT Criminal Activity/Legal Involvement Pertinent to Current Situation/Hospitalization: No - Comment as needed  Activities of Daily Living Home Assistive Devices/Equipment: None ADL Screening (condition at time of admission) Patient's cognitive ability adequate to safely complete daily activities?: No Is the patient deaf or have  difficulty hearing?: Yes Does the patient have difficulty seeing, even when wearing glasses/contacts?: No Does the patient have difficulty concentrating, remembering, or making decisions?: Yes Patient able to express need for assistance with ADLs?: No Does the patient have difficulty dressing or bathing?: Yes Independently performs ADLs?: No Communication: Needs assistance Dressing (OT): Dependent Grooming: Dependent Feeding: Dependent Bathing: Dependent Toileting: Dependent In/Out Bed: Dependent Does the patient have difficulty walking or climbing stairs?: Yes Weakness of Legs: Both Weakness of Arms/Hands: Both  Permission Sought/Granted                  Emotional Assessment Appearance:: Appears stated age Attitude/Demeanor/Rapport: Engaged Affect (typically observed): Calm Orientation: : Oriented to Self, Oriented to Place, Oriented to  Time, Oriented to Situation Alcohol / Substance Use: Not Applicable Psych Involvement: No (comment)  Admission diagnosis:  Constipation [K59.00] Cystitis [N30.90] Sepsis ([place]) [A41.9] Sepsis, due to unspecified organism, unspecified whether acute organ dysfunction present ([place]) [A41.9] COVID-19 [U07.1] Patient Active Problem List   Diagnosis Date Noted   Fecal impaction ([place]) 04/12/2021   Hypokalemia 04/12/2021   Hypomagnesemia 04/12/2021   Sepsis ([place]) 04/11/2021   Cystitis    Positive blood culture 04/03/2021   COVID-19 03/27/2021   Severe sepsis ([place]) 03/27/2021   Pressure injury of skin 03/47/4259   Acute metabolic encephalopathy 56/38/7564   Constipation 03/11/2021   Weight loss 03/11/2021   Pain due to onychomycosis of  toenails of both feet 01/27/2021   Chronic obstructive pulmonary disease ([place]) 11/20/2020   Other chronic pain    UTI due to extended-spectrum beta lactamase (ESBL) producing Escherichia coli 07/30/2020   Hypertension associated with diabetes ([place]) 05/07/2020   Hyperlipidemia associated with type 2  diabetes mellitus ([place]) 05/07/2020   Depression 05/07/2020   PAD (peripheral artery disease) ([place]) 02/05/2020   Lower extremity edema 11/20/2019   Dysuria 05/11/2019   Upper esophageal web 03/27/2019   Iron deficiency anemia 03/27/2019   Candida esophagitis ([place]) 02/14/2019   UTI (urinary tract infection) 01/25/2019   Acute lower UTI 01/25/2019   Encounter for general adult medical examination with abnormal findings 01/09/2019   Abnormal barium swallow 12/28/2018   History of colonic polyps 12/28/2018   Dysphagia 12/28/2018   Diabetic foot ulcer ([place]) 12/05/2018   Rotator cuff arthropathy, left 11/16/2018   Degenerative arthritis of left knee 11/16/2018   Insomnia 09/26/2018   Leukocytosis 10/17/2017   Severe protein-calorie malnutrition ([place]) 10/10/2017   Memory changes 07/29/2017   Granulomatous lung disease ([place]) 06/14/2017   Tracheobronchomalacia 06/14/2017   Dizziness 03/04/2017   Exercise hypoxemia 07/12/2016   Chronic heart failure with preserved ejection fraction ([place]) 12/25/2015   GERD (gastroesophageal reflux disease) 12/25/2015   Epidermal inclusion cyst 09/02/2015   Muscle cramps 03/06/2015   Cough 01/07/2015   Bronchiectasis without acute exacerbation ([place]) 01/07/2015   PMR (polymyalgia rheumatica) ([place]) 01/07/2015   Osteoarthritis 01/07/2015   Asthma, chronic 11/12/2014   Anxiety state 11/12/2014   Hyperparathyroidism ([place]) 10/28/2014   Fibromyalgia 09/21/2014   Personal history of ovarian cancer 08/23/2014   Osteoporosis 08/23/2014   DM2 (diabetes mellitus, type 2) ([place]) 08/02/2014   HLD (hyperlipidemia) 08/02/2014   Hypothyroidism 08/02/2014   PCP:  [name], MD Pharmacy:   [id number] [address], [place] [address] Phone: [phone] Fax: [place], [place] [place]. [place]  [address] Phone: [phone] Fax: [phone]     Social Determinants of Health (SDOH) Interventions    Readmission Risk Interventions Readmission Risk Prevention Plan 01/12/2021 08/04/2020 05/09/2020  Transportation Screening Complete Complete Complete  PCP or Specialist Appt within 5-7 Days - - Complete  PCP or Specialist Appt within 3-5 Days Complete - -  Home Care Screening - - Complete  HRI or Home Care Consult Complete - -  Palliative Care Screening Not Applicable - -  Medication Review ([place]) Complete - -  PCP or Specialist appointment within 3-5 days of discharge - Complete -  SW Recovery Care/Counseling Consult - Complete -  [place] - Not Applicable -  Some recent data might be hidden

## 2021-04-13 NOTE — Progress Notes (Signed)
Initial Nutrition Assessment  DOCUMENTATION CODES:   Underweight  INTERVENTION:  - will order Boost Breeze BID, each supplement provides 250 kcal and 9 grams of protein. - will order Ensure Plus High Protein BID, each supplement provides 350 kcal and 20 grams of protein. - will order 1 tablet multivitamin with minerals/day. - recommend liberalizing diet from Heart Healthy/Carb Modified to Regular so patient's choices are not limited and so visitors can bring in items she enjoys.   NUTRITION DIAGNOSIS:   Increased nutrient needs related to acute illness, catabolic illness (XJOIT-25 infection) as evidenced by estimated needs.  GOAL:   Patient will meet greater than or equal to 90% of their needs  MONITOR:   PO intake, Supplement acceptance, Labs, Weight trends  REASON FOR ASSESSMENT:   Malnutrition Screening Tool, Consult Assessment of nutrition requirement/status  ASSESSMENT:   86 y.o. female with medical history of polymyalgia rheumatica on prednisone, type 2 DM, COPD, diastolic CHF, chronic leukocytosis, macrocytic anemia, ovarian cancer s/p hysterectomy and oophorectomy. She was recently admitted for sepsis and respiratory failure due to [place] and was discharged on 03/31/21. She returned to the ED due to increasing confusion, not eating well, abdominal pain, and no BM x10 days.  Patient noted to be a/o to self only. Able to communicate with RN who shares that patient has had 3 cups of water and a bite of applesauce today. Even with encouragement and education on importance of nutrition and things like Ensure, patient declined.   She has not been seen by a [place] RD since 08/01/20.  Weight on 1/21 was 90 lb and weight on 03/11/21 was 96 lb. This indicates 6 lb weight loss (6.2% body weight) in the past 1 month; significant for time frame.     Labs reviewed; CBGs: 108 and 129 mg/dl.  Medications reviewed; 100 mg colace/day, sliding scale novolog, 5 units semglee/day, 50  mcg oral synthroid/day, 40 mg oral protonix/day, 17 g miralax BID, 2 mg deltasone/day, 2500 mcg oral cyanocobalamin every Thursday.    NUTRITION - FOCUSED PHYSICAL EXAM:  Unable to complete at this time.   Diet Order:   Diet Order             Diet heart healthy/carb modified Room service appropriate? Yes; Fluid consistency: Thin  Diet effective now                   EDUCATION NEEDS:   No education needs have been identified at this time  Skin:  Skin Assessment: Reviewed RN Assessment  Last BM:  1/22 (type 1 x2, one small and one large)  Height:   Ht Readings from Last 1 Encounters:  04/11/21 4\' 11"  (1.499 m)    Weight:   Wt Readings from Last 1 Encounters:  04/11/21 40.7 kg      Estimated Nutritional Needs:  Kcal:  1425-1630 kcal Protein:  70-85 grams Fluid:  >/= 1.8 L/day     Jarome Matin, MS, RD, LDN Inpatient Clinical Dietitian RD pager # available in [place]  After hours/weekend pager # available in [facility]

## 2021-04-13 NOTE — Telephone Encounter (Signed)
Pt daughter is wanting to speak to Dr. Sharlet Salina about Possible Palliative care order. Pts daughter also want to also talk about the amount of Medications that the pt is taking if they are all necessary at this time due to the pt complaining about taking so much medication. Pt is having difficulty swallowing some of the medications even taking them with apple sauce.  [name] CB [phone]  Pt had an appt scheduled today but was cx due to pt being hospitalized

## 2021-04-13 NOTE — Progress Notes (Addendum)
PROGRESS NOTE    Kayla Thompson  AJO:[id number] DOB: 1936/02/14 DOA: 04/11/2021 PCP: [name], MD   Brief Narrative: Kayla Thompson is a 86 y.o. female with a history of polymyalgia rheumatica on prednisone, diabetes mellitus type 2, COPD, diastolic CHF, chronic leukocytosis, macrocytic anemia, history of ovarian cancer status post hysterectomy and oophorectomy. Patient presented secondary to confusion and abdominal discomfort with evidence of sepsis secondary to UTI. She was also found to be COVID-19 positive without symptoms or signs of active disease. Empiric antibiotics started. Blood and urine cultures obtained. Urine culture significant for yeast. Patient transitioned to fluconazole.   Assessment & Plan:   * Sepsis ([place])- (present on admission) Present on admission. Concern for UTI but patient is also COVID test positive. Blood and urine cultures obtained on admission. Initially started on Cefepime and Flagyl, transitioned to meropenem secondary to history of ESBL infection. Yeast on urine culture. -See problem, Yeast cystitis  Fecal impaction [facility]) Patient with a chronic issue of constipation. Fecal impaction noted on imaging. Failed attempt at manual disimpaction while in the ED. Good response to enema administration. -Continue Miralax  Yeast cystitis- (present on admission) Urinalysis suggests pyuria, but no mention of bacteria. Cystitis suggested on imaging. Patient admitted with evidence of sepsis. History of ESBL infection. Urine culture significant for yeast. -Discontinue meropenem -Start fluconazole PO  Hypomagnesemia Given Magnesium supplementation   Hypokalemia Associated hypomagnesemia. Resolved with repletion.  PMR (polymyalgia rheumatica) (HCC)- (present on admission) -Continue home prednisone, Cymbalta, gabapentin and Percocet  Acute metabolic encephalopathy-resolved as of 04/13/2021, (present on admission) Likely secondary to infection. Appears  to have cleared with treatment of infection.  COVID-19- (present on admission) Test positive, but previously diagnosed and treated. No symptoms.  Chronic obstructive pulmonary disease (HCC)- (present on admission) Patient uses Trelegy ellipta as an outpatient. -Continue Incruse ellipta (substituted for Trelegy) -Continue albuterol prn  Hypothyroidism- (present on admission) -Continue Synthroid  DM2 (diabetes mellitus, type 2) ([place]) Patient is on Lantus 5 units daily as an outpatient. Last hemoglobin A1C of 6.5% which is very controlled for age. -Continue Semglee and SSI -May benefit from cessation of insulin therapy to minimize hypoglycemia as an outpatient     DVT prophylaxis: Lovenox Code Status:   Code Status: DNR Family Communication: Called daughter but no answer Disposition Plan: Discharge home with home health likely in 24 hours   Consultants:  None  Procedures:  None  Antimicrobials: Meropenem Cefepime Flagyl    Subjective: No issues this morning per patient.  Objective: Vitals:   04/12/21 2125 04/13/21 0611 04/13/21 0637 04/13/21 1316  BP: (!) 145/83 (!) 174/81 (!) 165/77 129/85  Pulse: 98 79 78 (!) 102  Resp: 20 20  18   Temp: 98.6 F (37 C) (!) 97.5 F (36.4 C)  97.7 F (36.5 C)  TempSrc: Oral Oral  Oral  SpO2: 100%   98%  Weight:      Height:        Intake/Output Summary (Last 24 hours) at 04/13/2021 1624 Last data filed at 04/13/2021 0740 Gross per 24 hour  Intake 240 ml  Output 1350 ml  Net -1110 ml    Filed Weights   04/11/21 1140 04/11/21 2005  Weight: 41.9 kg 40.7 kg    Examination:  General exam: Appears calm and comfortable Respiratory system: Diminished on auscultation. Respiratory effort normal. Cardiovascular system: S1 & S2 heard, RRR. No murmurs, rubs, gallops or clicks. Gastrointestinal system: Abdomen is nondistended, soft and nontender. No organomegaly or masses  felt. Normal bowel sounds heard. Central nervous system:  Alert and oriented to self. No focal neurological deficits. Musculoskeletal: No edema. No calf tenderness Skin: No cyanosis. No rashes    Data Reviewed: I have personally reviewed following labs and imaging studies  CBC Lab Results  Component Value Date   WBC 15.5 (H) 04/13/2021   RBC 2.63 (L) 04/13/2021   HGB 9.5 (L) 04/13/2021   HCT 29.1 (L) 04/13/2021   MCV 110.6 (H) 04/13/2021   MCH 36.1 (H) 04/13/2021   PLT 314 04/13/2021   MCHC 32.6 04/13/2021   RDW 14.3 04/13/2021   LYMPHSABS 3.2 03/26/2021   MONOABS 0.9 03/26/2021   EOSABS 0.2 03/26/2021   BASOSABS 0.1 17/51/0258     Last metabolic panel Lab Results  Component Value Date   NA 135 04/13/2021   K 4.0 04/13/2021   CL 103 04/13/2021   CO2 25 04/13/2021   BUN 17 04/13/2021   CREATININE 0.65 04/13/2021   GLUCOSE 155 (H) 04/13/2021   GFRNONAA >60 04/13/2021   GFRAA 72 01/08/2020   CALCIUM 9.1 04/13/2021   PHOS 3.8 11/08/2014   PROT 5.3 (L) 04/12/2021   ALBUMIN 2.5 (L) 04/12/2021   BILITOT 0.8 04/12/2021   ALKPHOS 38 04/12/2021   AST 14 (L) 04/12/2021   ALT 16 04/12/2021   ANIONGAP 7 04/13/2021    CBG (last 3)  Recent Labs    04/12/21 2122 04/13/21 0751 04/13/21 1126  GLUCAP 162* 108* 129*      GFR: Estimated Creatinine Clearance: 34.2 mL/min (by C-G formula based on SCr of 0.65 mg/dL).  Coagulation Profile: Recent Labs  Lab 04/11/21 1310  INR 1.0     Recent Results (from the past 240 hour(s))  Urine Culture     Status: Abnormal   Collection Time: 04/11/21 12:31 PM   Specimen: Urine, Clean Catch  Result Value Ref Range Status   Specimen Description   Final    URINE, CLEAN CATCH Performed at [facility], [address], [place], [place] [address]    Special Requests   Final    NONE Performed at [facility], [address] [place], [place] [address]    Culture >=100,000 COLONIES/mL YEAST (A)  Final   Report Status 04/12/2021 FINAL  Final   Blood culture (routine x 2)     Status: None (Preliminary result)   Collection Time: 04/11/21  1:10 PM   Specimen: BLOOD  Result Value Ref Range Status   Specimen Description   Final    BLOOD BLOOD LEFT FOREARM Performed at [facility], [address], [place], [place] [address]    Special Requests   Final    BOTTLES DRAWN AEROBIC AND ANAEROBIC Blood Culture adequate volume Performed at [facility], [address] [place], [place] [address]    Culture   Final    NO GROWTH 2 DAYS Performed at [facility], [place] [address] [place], [place] [address]    Report Status PENDING  Incomplete  Resp Panel by RT-PCR (Flu A&B, Covid) Nasopharyngeal Swab     Status: Abnormal   Collection Time: 04/11/21  1:25 PM   Specimen: Nasopharyngeal Swab; Nasopharyngeal(NP) swabs in vial transport medium  Result Value Ref Range Status   SARS Coronavirus 2 by RT PCR POSITIVE (A) NEGATIVE Final    Comment: (NOTE) SARS-CoV-2 target nucleic acids are DETECTED.  The SARS-CoV-2 RNA is generally detectable in upper respiratory specimens during the acute phase of infection. Positive results are  indicative of the presence of the identified virus, but do not rule out bacterial infection or co-infection with other pathogens not detected by the test. Clinical correlation with patient history and other diagnostic information is necessary to determine patient infection status. The expected result is Negative.  Fact Sheet for Patients: EntrepreneurPulse.com.au  Fact Sheet for Healthcare Providers: IncredibleEmployment.be  This test is not yet approved or cleared by the [place] FDA and  has been authorized for detection and/or diagnosis of SARS-CoV-2 by FDA under an Emergency Use Authorization (EUA).  This EUA will remain in effect (meaning this test can be used) for the duration of  the COVID-19 declaration under Section  564(b)(1) of the A ct, 21 U.S.C. section 360bbb-3(b)(1), unless the authorization is terminated or revoked sooner.     Influenza A by PCR NEGATIVE NEGATIVE Final   Influenza B by PCR NEGATIVE NEGATIVE Final    Comment: (NOTE) The Xpert Xpress SARS-CoV-2/FLU/RSV plus assay is intended as an aid in the diagnosis of influenza from Nasopharyngeal swab specimens and should not be used as a sole basis for treatment. Nasal washings and aspirates are unacceptable for Xpert Xpress SARS-CoV-2/FLU/RSV testing.  Fact Sheet for Patients: EntrepreneurPulse.com.au  Fact Sheet for Healthcare Providers: IncredibleEmployment.be  This test is not yet approved or cleared by the [place] FDA and has been authorized for detection and/or diagnosis of SARS-CoV-2 by FDA under an Emergency Use Authorization (EUA). This EUA will remain in effect (meaning this test can be used) for the duration of the COVID-19 declaration under Section 564(b)(1) of the Act, 21 U.S.C. section 360bbb-3(b)(1), unless the authorization is terminated or revoked.  Performed at [facility], [address] [place], [place] [address]   Blood culture (routine x 2)     Status: None (Preliminary result)   Collection Time: 04/11/21  1:35 PM   Specimen: BLOOD  Result Value Ref Range Status   Specimen Description   Final    BLOOD LEFT WRIST Performed at [facility], [address], [place], [place] [address]    Special Requests   Final    BOTTLES DRAWN AEROBIC AND ANAEROBIC Blood Culture adequate volume Performed at [facility], [address] [place], [place] [address]    Culture   Final    NO GROWTH 2 DAYS Performed at [facility], [place] [address] [place], [place] [address]    Report Status PENDING  Incomplete         Radiology Studies: CT HEAD WO CONTRAST (5MM)  Result Date: 04/11/2021 CLINICAL DATA:   Altered mental status. EXAM: CT HEAD WITHOUT CONTRAST TECHNIQUE: Contiguous axial images were obtained from the base of the skull through the vertex without intravenous contrast. RADIATION DOSE REDUCTION: This exam was performed according to the departmental dose-optimization program which includes automated exposure control, adjustment of the mA and/or kV according to patient size and/or use of iterative reconstruction technique. COMPARISON:  Head CT dated 03/27/2021. FINDINGS: Brain: There is mild age-related atrophy and moderate chronic microvascular ischemic changes. There is no acute intracranial hemorrhage. No mass effect or midline shift. No extra-axial fluid collection. Vascular: No hyperdense vessel or unexpected calcification. Skull: Normal. Negative for fracture or focal lesion. Sinuses/Orbits: No acute finding. Other: None IMPRESSION: 1. No acute intracranial pathology. 2. Age-related atrophy and chronic microvascular ischemic changes. Electronically Signed   By: Anner Crete M.D.   On: 04/11/2021 23:48        Scheduled Meds:  baclofen  10 mg  Oral QHS   docusate sodium  100 mg Oral Daily   DULoxetine  30 mg Oral q AM   DULoxetine  60 mg Oral q AM   enoxaparin (LOVENOX) injection  30 mg Subcutaneous Q24H   feeding supplement  1 Container Oral BID BM   feeding supplement  237 mL Oral BID BM   fluconazole  200 mg Oral Daily   fluticasone furoate-vilanterol  1 puff Inhalation Daily   And   umeclidinium bromide  1 puff Inhalation Daily   gabapentin  100 mg Oral QHS   Gerhardt's butt cream   Topical BID   insulin aspart  0-9 Units Subcutaneous TID WC   insulin glargine-yfgn  5 Units Subcutaneous q AM   levothyroxine  50 mcg Oral Q0600   linaclotide  72 mcg Oral QAC breakfast   mirtazapine  15 mg Oral QHS   montelukast  10 mg Oral Daily   multivitamin with minerals  1 tablet Oral Daily   pantoprazole  40 mg Oral Daily   polyethylene glycol  17 g Oral BID   predniSONE  2 mg Oral  Q breakfast   rosuvastatin  10 mg Oral QPM   [START ON 04/16/2021] vitamin B-12  2,500 mcg Oral Q Thu   Continuous Infusions:     LOS: 2 days     Cordelia Poche, MD [facility] 04/13/2021, 4:24 PM  If 7PM-7AM, please contact night-coverage [email or web address]

## 2021-04-13 NOTE — Telephone Encounter (Signed)
See below

## 2021-04-13 NOTE — Telephone Encounter (Signed)
Spoke with [name] and she verbalized understanding Dr. Nathanial Millman recommendation. No other questions or concerns.

## 2021-04-13 NOTE — Telephone Encounter (Signed)
I would recommend she talk to hospital doctor about this now and then we can follow up once she is out of hospital.

## 2021-04-13 NOTE — Assessment & Plan Note (Signed)
Test positive, but previously diagnosed and treated. No symptoms.

## 2021-04-14 LAB — CBC
HCT: 30 % — ABNORMAL LOW (ref 36.0–46.0)
Hemoglobin: 9.5 g/dL — ABNORMAL LOW (ref 12.0–15.0)
MCH: 35.7 pg — ABNORMAL HIGH (ref 26.0–34.0)
MCHC: 31.7 g/dL (ref 30.0–36.0)
MCV: 112.8 fL — ABNORMAL HIGH (ref 80.0–100.0)
Platelets: 301 10*3/uL (ref 150–400)
RBC: 2.66 MIL/uL — ABNORMAL LOW (ref 3.87–5.11)
RDW: 14.6 % (ref 11.5–15.5)
WBC: 16.6 10*3/uL — ABNORMAL HIGH (ref 4.0–10.5)
nRBC: 0 % (ref 0.0–0.2)

## 2021-04-14 LAB — GLUCOSE, CAPILLARY
Glucose-Capillary: 129 mg/dL — ABNORMAL HIGH (ref 70–99)
Glucose-Capillary: 139 mg/dL — ABNORMAL HIGH (ref 70–99)
Glucose-Capillary: 209 mg/dL — ABNORMAL HIGH (ref 70–99)

## 2021-04-14 MED ORDER — POLYETHYLENE GLYCOL 3350 17 G PO PACK: 17.0000 g | PACK | Freq: Two times a day (BID) | ORAL | 0 refills | Status: DC

## 2021-04-14 MED ORDER — FLUCONAZOLE 100 MG PO TABS: 100.0000 mg | ORAL_TABLET | Freq: Every day | ORAL | 0 refills | Status: AC

## 2021-04-14 MED ORDER — FLUCONAZOLE 200 MG PO TABS: 200.0000 mg | ORAL_TABLET | Freq: Every day | ORAL | 0 refills | Status: DC

## 2021-04-14 MED ORDER — DOXYCYCLINE HYCLATE 100 MG PO TABS
100.0000 mg | ORAL_TABLET | Freq: Two times a day (BID) | ORAL | Status: DC
  Administered 2021-04-14: 12:00:00 100 mg via ORAL
  Filled 2021-04-14: qty 1

## 2021-04-14 NOTE — TOC Progression Note (Addendum)
Transition of Care [facility]) - Progression Note    Patient Details  Name: Kayla Thompson MRN: [id number] Date of Birth: 10-28-1935  [facility]) CM/SW Contact  Leeroy Cha, RN Phone Number: 04/14/2021, 9:02 AM  Clinical Narrative:    Tct-daughter-she is upset that patient is being dcd without her being able to talk with the md first.  Feels that patient has not gotten better and was just in the hospital two weeks ago.  Message sent to md requesting that he call the daughter and discuss care with her. Palliative care consult to authrocare done.  Patient unable to leave until 5pm due to daughter;'s work schedule.  Ptar will be called for 5 pm pick up   Expected Discharge Plan: Home/Self Care Barriers to Discharge: Continued Medical Work up  Expected Discharge Plan and Services Expected Discharge Plan: Home/Self Care   Discharge Planning Services: CM Consult   Living arrangements for the past 2 months: Single Family Home Expected Discharge Date: 04/14/21                                     Social Determinants of Health (SDOH) Interventions    Readmission Risk Interventions Readmission Risk Prevention Plan 04/13/2021 01/12/2021 08/04/2020  Transportation Screening Complete Complete Complete  PCP or Specialist Appt within 5-7 Days - - -  PCP or Specialist Appt within 3-5 Days - Complete -  [place] or Home Care Consult - Complete -  Palliative Care Screening - Not Applicable -  Medication Review (RN Care Manager) Complete Complete -  PCP or Specialist appointment within 3-5 days of discharge Complete - Complete  [place] or Home Care Consult Complete - -  SW Recovery Care/Counseling Consult Complete - Complete  Palliative Care Screening Not Applicable - Not [place] Not Applicable - Not Applicable  Some recent data might be hidden

## 2021-04-14 NOTE — Progress Notes (Addendum)
Pt daughter came to d/c pt to home, though PTAR was set up and on the way to transport pt. Pt was safely escorted to private vehicle via wheelchair with her daughter. Pt was alert and oriented x2(self and situation) and remains forgetful, no signs of distress or pain noted. All pt belongings were sent home with pt along with discharge paperwork in pt daughter's hands.

## 2021-04-14 NOTE — Discharge Summary (Addendum)
Physician Discharge Summary  RANDEE HUSTON AJO:[id number] DOB: 1935-10-19 DOA: 04/11/2021  PCP: [name], MD  Admit date: 04/11/2021 Discharge date: 04/14/2021  Admitted From: Home Disposition: Home  Recommendations for Outpatient Follow-up:  Follow up with PCP in 1 week Follow up with ID as previously planned Please obtain BMP/CBC in one week Palliative care medicine referral placed Consider discontinuing insulin therapy Continued bowel regimen to prevent recurrent fecal impaction Please follow up on the following pending results: Blood culture  Home Health: None Equipment/Devices: None  Discharge Condition: Stable CODE STATUS: DNR Diet recommendation: Carb modified   Brief/Interim Summary:  Admission HPI written by Rise Patience, MD   HPI: SENA HOOPINGARNER is a 86 y.o. female with history of polymyalgia rheumatica on prednisone, diabetes mellitus type 2, COPD, diastolic CHF, chronic leukocytosis, macrocytic anemia, history of ovarian cancer status post hysterectomy and oophorectomy who was recently admitted to the hospital for sepsis and respiratory failure with COVID infection discharged on March 31, 2021 during which patient also was found to have actinomyces bacteremia and was started on doxycycline since patient had allergy to penicillin was found to be increasingly confused weak not eating well and complained of abdominal discomfort as per the patient's daughter.  Patient has not had a bowel movement for almost 10 days.  Patient also has a chronic rash in the groin area and has multiple skin changes in the lower extremity.   Hospital course:  * Sepsis [facility])- (present on admission) Present on admission. Concern for UTI but patient is also COVID test positive. Blood and urine cultures obtained on admission. Initially started on Cefepime and Flagyl, transitioned to meropenem secondary to history of ESBL infection. Yeast on urine culture. Otherwise, see  problem, Yeast cystitis  Fecal impaction [facility]) Patient with a chronic issue of constipation. Fecal impaction noted on imaging. Failed attempt at manual disimpaction while in the ED. Good response to enema administration. Recommendation for Miralax on discharge.  Yeast cystitis- (present on admission) Urinalysis suggests pyuria, but no mention of bacteria. Cystitis suggested on imaging. Patient admitted with evidence of sepsis. History of ESBL infection so patient was managed on empiric meropenem. Urine culture significant for yeast, so meropenem was discontinued and patient was started on fluconazole by mouth. Discharged to continue a 2 week course for treatment.  Hypomagnesemia Given Magnesium supplementation   Hypokalemia Associated hypomagnesemia. Resolved with repletion.  PMR (polymyalgia rheumatica) (HCC)- (present on admission) Continue home prednisone, Cymbalta, gabapentin and Percocet  Acute metabolic encephalopathy-resolved as of 04/13/2021, (present on admission) Likely secondary to infection. Appears to have cleared with treatment of infection.  COVID-19- (present on admission) Test positive, but previously diagnosed and treated. No symptoms.  Chronic obstructive pulmonary disease ([place])- (present on admission) Patient uses Trelegy ellipta as an outpatient. Continue.  Hypothyroidism- (present on admission) Continue Synthroid  DM2 (diabetes mellitus, type 2) ([place]) Patient is on Lantus 5 units daily as an outpatient. Last hemoglobin A1C of 6.5% which is very controlled for age. May benefit from cessation of insulin therapy to minimize hypoglycemia as an outpatient  Positive blood culture- (present on admission) History. Present prior to admission. Patient is on doxycycline as an outpatient. Clarified with ID, Dr. Linus Salmons who recommends patient continue 2 week treatment course of doxycycline. Blood cultures with no growth (x3 days) this admission.  Leukocytosis- (present on  admission) Persistently elevated for years. May benefit from smear review.no associated fevers or concerning symptoms/signs.    Discharge Instructions  Discharge Instructions  Amb Referral to Palliative Care   Complete by: As directed       Allergies as of 04/14/2021       Reactions   Tape Other (See Comments)   SKIN IS VERY THIN AND DELICATE- will tear like paper!!   Lasix [furosemide] Other (See Comments)   Confusion- MD stopped this med   Latex Rash   Penicillins Rash   Has patient had a PCN reaction causing immediate rash, facial/tongue/throat swelling, SOB or lightheadedness with hypotension: No Has patient had a PCN reaction causing severe rash involving mucus membranes or skin necrosis: No Has patient had a PCN reaction that required hospitalization: No Has patient had a PCN reaction occurring within the last 10 years: No If all of the above answers are "NO", then may proceed with Cephalosporin use.        Medication List     STOP taking these medications    insulin lispro 100 UNIT/ML KwikPen Commonly known as: HumaLOG KwikPen       TAKE these medications    acetaminophen 650 MG CR tablet Commonly known as: TYLENOL Take 1 tablet (650 mg total) by mouth See admin instructions. Take 1,300 mg by mouth every 4-5 hours IN CONJUNCTION WITH one Percocet 7.5/325 tablet- for pain   albuterol 108 (90 Base) MCG/ACT inhaler Commonly known as: ProAir HFA Inhale 2 puffs into the lungs every 6 (six) hours as needed for wheezing or shortness of breath.   Apoaequorin 10 MG Caps Take 10 mg by mouth every evening.   baclofen 10 MG tablet Commonly known as: LIORESAL TAKE 0.5-1 TABLET BY MOUTH TWICE DAILY AS NEEDED FOR MUSCLE SPASMS OR PAIN What changed: See the new instructions.   BD Pen Needle Nano 2nd Gen 32G X 4 MM Misc Generic drug: Insulin Pen Needle USE AS DIRECTED 4 TIMES A DAY   CALTRATE 600 PO Take 600 mg by mouth in the morning and at bedtime.    cholecalciferol 25 MCG (1000 UNIT) tablet Commonly known as: VITAMIN D3 Take 1,000 Units by mouth daily.   denosumab 60 MG/ML Sosy injection Commonly known as: PROLIA Inject 60 mg into the skin every 6 (six) months.   docusate sodium 100 MG capsule Commonly known as: COLACE Take 1 capsule (100 mg total) by mouth daily.   doxycycline 100 MG capsule Commonly known as: VIBRAMYCIN Take 1 capsule (100 mg total) by mouth 2 (two) times daily.   DULoxetine 30 MG capsule Commonly known as: CYMBALTA Take 1 capsule (30 mg total) by mouth daily. What changed: when to take this   DULoxetine 60 MG capsule Commonly known as: CYMBALTA Take 1 capsule (60 mg total) by mouth daily. What changed: when to take this   estradiol 0.1 MG/GM vaginal cream Commonly known as: ESTRACE Place 1 Applicatorful vaginally See admin instructions. Takes 2 times a week   feeding supplement Liqd Take 237 mLs by mouth 2 (two) times daily between meals. What changed: when to take this   fluconazole 200 MG tablet Commonly known as: DIFLUCAN Take 1 tablet (200 mg total) by mouth daily for 12 days. Start taking on: April 15, 2021   freestyle lancets Use to test blood sugar 3 times daily. Dx: E11.65   FREESTYLE LITE test strip Generic drug: glucose blood USE TO TEST BLOOD SUGAR 3 TIMES DAILY.   gabapentin 100 MG capsule Commonly known as: NEURONTIN Take 100 mg by mouth at bedtime.   insulin glargine 100 UNIT/ML Solostar Pen Commonly known as:  LANTUS Inject 5 Units into the skin in the morning.   ipratropium-albuterol 0.5-2.5 (3) MG/3ML Soln Commonly known as: DUONEB Inhale 3 mLs into the lungs every 4 (four) hours as needed. What changed: when to take this   levothyroxine 50 MCG tablet Commonly known as: SYNTHROID TAKE 1 TABLET BY MOUTH EVERY DAY BEFORE BREAKFAST What changed: See the new instructions.   linaclotide 72 MCG capsule Commonly known as: Linzess Take 1 capsule (72 mcg total) by  mouth daily before breakfast.   methenamine 1 g tablet Commonly known as: MANDELAMINE Take 1,000 mg by mouth 2 (two) times daily.   mirtazapine 15 MG tablet Commonly known as: REMERON TAKE 1 TABLET BY MOUTH EVERYDAY AT BEDTIME What changed: See the new instructions.   montelukast 10 MG tablet Commonly known as: SINGULAIR TAKE 1 TABLET BY MOUTH EVERY DAY What changed: when to take this   Mucinex DM Maximum Strength 60-1200 MG Tb12 Take 1 tablet by mouth in the morning and at bedtime.   naloxone 4 MG/0.1ML Liqd nasal spray kit Commonly known as: NARCAN Place 1 spray into the nose once as needed (overdose).   nitrofurantoin (macrocrystal-monohydrate) 100 MG capsule Commonly known as: MACROBID Take 100 mg by mouth in the morning.   omeprazole 20 MG capsule Commonly known as: PRILOSEC TAKE 1 CAPSULE TWICE A DAY BEFORE A MEAL What changed: See the new instructions.   oxyCODONE-acetaminophen 7.5-325 MG tablet Commonly known as: PERCOCET Take 1 tablet by mouth See admin instructions. Take 1 tablet by mouth every 4-5 hours (IN CONJUNCTION WITH 1,300 mg of Tylenol). Max 5 tab/day.   polyethylene glycol 17 g packet Commonly known as: MIRALAX / GLYCOLAX Take 17 g by mouth 2 (two) times daily. Decrease to daily or every other day pending on bowel movement frequency/consistency   predniSONE 1 MG tablet Commonly known as: DELTASONE Take 1 tablet (1 mg total) by mouth in the morning and at bedtime. What changed:  how much to take when to take this   rosuvastatin 10 MG tablet Commonly known as: CRESTOR Take 1 tablet (10 mg total) by mouth every evening.   TART CHERRY ADVANCED PO Take 1,000 mg by mouth every evening.   Trelegy Ellipta 100-62.5-25 MCG/ACT Aepb Generic drug: Fluticasone-Umeclidin-Vilant INHALE 1 PUFF INTO THE LUNGS DAILY. RINSE MOUTH What changed:  when to take this additional instructions   TURMERIC PO Take 1,000 mg by mouth in the morning.   VITAMIN B 12  PO Take 2,500 mcg by mouth every Thursday.   vitamin C 1000 MG tablet Take 1,000 mg by mouth in the morning and at bedtime.        Follow-up Information     [name], MD. Schedule an appointment as soon as possible for a visit in 1 week(s).   Specialty: Internal Medicine Why: For hospital follow-up Contact information: [place] [address] [phone]         Thayer Headings, MD Follow up.   Specialty: Infectious Diseases Why: Positive blood culture prior to this admission Contact information: [address] [place] [address] [phone]                Allergies  Allergen Reactions   Tape Other (See Comments)    SKIN IS VERY THIN AND DELICATE- will tear like paper!!   Lasix [Furosemide] Other (See Comments)    Confusion- MD stopped this med   Latex Rash   Penicillins Rash  Has patient had a PCN reaction causing immediate rash, facial/tongue/throat swelling, SOB or lightheadedness with hypotension: No Has patient had a PCN reaction causing severe rash involving mucus membranes or skin necrosis: No Has patient had a PCN reaction that required hospitalization: No Has patient had a PCN reaction occurring within the last 10 years: No If all of the above answers are "NO", then may proceed with Cephalosporin use.     Consultations: None   Procedures/Studies: CT HEAD WO CONTRAST (5MM)  Result Date: 04/11/2021 CLINICAL DATA:  Altered mental status. EXAM: CT HEAD WITHOUT CONTRAST TECHNIQUE: Contiguous axial images were obtained from the base of the skull through the vertex without intravenous contrast. RADIATION DOSE REDUCTION: This exam was performed according to the departmental dose-optimization program which includes automated exposure control, adjustment of the mA and/or kV according to patient size and/or use of iterative reconstruction technique. COMPARISON:  Head CT dated 03/27/2021. FINDINGS: Brain: There  is mild age-related atrophy and moderate chronic microvascular ischemic changes. There is no acute intracranial hemorrhage. No mass effect or midline shift. No extra-axial fluid collection. Vascular: No hyperdense vessel or unexpected calcification. Skull: Normal. Negative for fracture or focal lesion. Sinuses/Orbits: No acute finding. Other: None IMPRESSION: 1. No acute intracranial pathology. 2. Age-related atrophy and chronic microvascular ischemic changes. Electronically Signed   By: Anner Crete M.D.   On: 04/11/2021 23:48   CT HEAD WO CONTRAST (5MM)  Result Date: 03/27/2021 CLINICAL DATA:  Delirium, general weakness, fever, confusion. EXAM: CT HEAD WITHOUT CONTRAST TECHNIQUE: Contiguous axial images were obtained from the base of the skull through the vertex without intravenous contrast. COMPARISON:  05/07/2020. FINDINGS: Brain: No acute intracranial hemorrhage, midline shift or mass effect. No extra-axial fluid collection. There is diffuse cortical atrophy. Subcortical and periventricular white matter hypodensities are present bilaterally. There is no hydrocephalus. Vascular: Atherosclerotic calcification of the carotid siphons. No hyperdense vessel. Skull: Normal. Negative for fracture or focal lesion. Sinuses/Orbits: Mucosal thickening is present in the ethmoid air cells and maxillary sinuses bilaterally. An air-fluid level seen in the left sphenoid sinus. The orbits are within normal limits. Other: There chronic sclerosis in the inferior aspect of the mastoid air cells on the right. IMPRESSION: 1. No acute intracranial process. 2. Atrophy with chronic microvascular ischemic changes. Electronically Signed   By: Brett Fairy M.D.   On: 03/27/2021 03:01   CT Abdomen Pelvis W Contrast  Result Date: 04/11/2021 CLINICAL DATA:  Constipation.  Bowel obstruction suspected. EXAM: CT ABDOMEN AND PELVIS WITH CONTRAST TECHNIQUE: Multidetector CT imaging of the abdomen and pelvis was performed using the  standard protocol following bolus administration of intravenous contrast. RADIATION DOSE REDUCTION: This exam was performed according to the departmental dose-optimization program which includes automated exposure control, adjustment of the mA and/or kV according to patient size and/or use of iterative reconstruction technique. CONTRAST:  50mL OMNIPAQUE IOHEXOL 300 MG/ML  SOLN COMPARISON:  01/10/2021 FINDINGS: Lower chest: Similar appearance architectural distortion and scarring in the right lower lobe with some peripheral tree-in-bud nodularity in the lower lungs bilaterally, similar to prior and suggesting atypical infection. 6 mm left lower lobe nodule on image 12/series 6 is stable and also unchanged comparing back to 01/25/2019. Hepatobiliary: No suspicious focal abnormality within the liver parenchyma. Gallbladder is distended. No intrahepatic or extrahepatic biliary dilation. Pancreas: No focal mass lesion. No dilatation of the main duct. No intraparenchymal cyst. No peripancreatic edema. Spleen: No splenomegaly. No focal mass lesion. Adrenals/Urinary Tract: No adrenal nodule or mass. Kidneys unremarkable. No  evidence for hydroureter. Diffuse irregular bladder wall thickening noted with some mucosal hyperenhancement posteriorly. Stomach/Bowel: Stomach is nondistended. Duodenum is normally positioned as is the ligament of Treitz. No small bowel wall thickening. No small bowel dilatation. The terminal ileum is normal. The appendix is normal. Prominent stool volume noted with large volume formed stool in the sigmoid colon. Rectal vault is stool-filled measuring 6.9 x 7.7 cm diameter on axial imaging. Vascular/Lymphatic: There is moderate atherosclerotic calcification of the abdominal aorta without aneurysm. There is no gastrohepatic or hepatoduodenal ligament lymphadenopathy. No retroperitoneal or mesenteric lymphadenopathy. No pelvic sidewall lymphadenopathy. Surgical clips in the retroperitoneal space and  pelvic sidewall suggest prior lymph node dissection. Reproductive: Uterus is surgically absent. There is no adnexal mass. Other: No intraperitoneal free fluid. Musculoskeletal: Mild presacral edema evident, possibly related to the rectal distension. Status post right hip replacement chronic posttraumatic deformity noted both superior and inferior pubic rami. Nonacute posterior right rib fractures evident. IMPRESSION: 1. Prominent stool volume with large volume formed stool in the sigmoid colon. Rectal vault is stool-filled measuring 6.9 x 7.7 cm diameter on axial imaging. Imaging features raise concern for fecal impaction. 2. Diffuse irregular bladder wall thickening with some mucosal hyperenhancement posteriorly. Cystitis could have this appearance. 3. Similar appearance architectural distortion and scarring in the right lower lobe with some peripheral tree-in-bud nodularity in the lower lungs bilaterally, similar to prior and suggesting sequelae of atypical infection. 4. Stable 6 mm left lower lobe pulmonary nodule comparing back to 01/25/2019 consistent with benign etiology. 5. Aortic Atherosclerosis (ICD10-I70.0). Electronically Signed   By: Misty Stanley M.D.   On: 04/11/2021 15:48   DG Chest Port 1 View  Result Date: 04/11/2021 CLINICAL DATA:  Possible sepsis. EXAM: PORTABLE CHEST 1 VIEW COMPARISON:  03/26/2021 FINDINGS: 1527 hours. The lungs are clear without focal pneumonia, edema, pneumothorax or pleural effusion. Interstitial markings are diffusely coarsened with chronic features. The cardiopericardial silhouette is within normal limits for size. Advanced degenerative changes noted in both shoulders. Telemetry leads overlie the chest. IMPRESSION: Chronic interstitial coarsening without acute cardiopulmonary findings. Electronically Signed   By: Misty Stanley M.D.   On: 04/11/2021 15:37   DG Chest Port 1 View  Result Date: 03/26/2021 CLINICAL DATA:  Fevers EXAM: PORTABLE CHEST 1 VIEW COMPARISON:   Chest x-ray 11/26/2020 FINDINGS: Heart size is normal. Mediastinum appears stable. Calcified plaques in the aortic arch. Chronic prominent interstitial lung markings with no new focal consolidation identified. No pleural effusion or pneumothorax visualized. Severe degenerative changes of the shoulders noted. IMPRESSION: Chronic changes with no acute process identified. Electronically Signed   By: Ofilia Neas M.D.   On: 03/26/2021 12:29   DG Abd Portable 1 View  Result Date: 04/11/2021 CLINICAL DATA:  Constipation. EXAM: PORTABLE ABDOMEN - 1 VIEW COMPARISON:  01/11/2021 FINDINGS: A moderate to large stool burden is identified within the descending colon, sigmoid colon and rectum. No dilated loops of small bowel identified to suggest obstruction. Previous ORIF of the right femur. Surgical clips are identified within the lower abdomen in both sides of pelvis. IMPRESSION: Moderate to large stool burden within the descending colon, sigmoid colon and rectum compatible with constipation. Electronically Signed   By: Kerby Moors M.D.   On: 04/11/2021 12:57      Subjective: No issues overnight  Discharge Exam: Vitals:   04/13/21 2129 04/14/21 0649  BP: (!) 156/79 133/87  Pulse: 95 89  Resp: 18 20  Temp: 98 F (36.7 C) 97.9 F (36.6 C)  SpO2:  100% 97%   Vitals:   04/13/21 0637 04/13/21 1316 04/13/21 2129 04/14/21 0649  BP: (!) 165/77 129/85 (!) 156/79 133/87  Pulse: 78 (!) 102 95 89  Resp:  $Remo'18 18 20  'lSBLS$ Temp:  97.7 F (36.5 C) 98 F (36.7 C) 97.9 F (36.6 C)  TempSrc:  Oral Oral   SpO2:  98% 100% 97%  Weight:      Height:        General: Pt is alert, awake, not in acute distress Cardiovascular: RRR, S1/S2 +, no rubs, no gallops Respiratory: CTA bilaterally, no wheezing, no rhonchi Abdominal: Soft, NT, ND, bowel sounds + Extremities:  no cyanosis    The results of significant diagnostics from this hospitalization (including imaging, microbiology, ancillary and laboratory) are  listed below for reference.     Microbiology: Recent Results (from the past 240 hour(s))  Urine Culture     Status: Abnormal   Collection Time: 04/11/21 12:31 PM   Specimen: Urine, Clean Catch  Result Value Ref Range Status   Specimen Description   Final    URINE, CLEAN CATCH Performed at [facility], [address] [place], [place] [address]    Special Requests   Final    NONE Performed at [facility], [address] [place], [place] [address]    Culture >=100,000 COLONIES/mL YEAST (A)  Final   Report Status 04/12/2021 FINAL  Final  Blood culture (routine x 2)     Status: None (Preliminary result)   Collection Time: 04/11/21  1:10 PM   Specimen: BLOOD  Result Value Ref Range Status   Specimen Description   Final    BLOOD BLOOD LEFT FOREARM Performed at [facility], [address], [place], [place] [address]    Special Requests   Final    BOTTLES DRAWN AEROBIC AND ANAEROBIC Blood Culture adequate volume Performed at [facility], [address], [place], [place] [address]    Culture   Final    NO GROWTH 3 DAYS Performed at [facility], [place] [address], [place], [place] [address]    Report Status PENDING  Incomplete  Resp Panel by RT-PCR (Flu A&B, Covid) Nasopharyngeal Swab     Status: Abnormal   Collection Time: 04/11/21  1:25 PM   Specimen: Nasopharyngeal Swab; Nasopharyngeal(NP) swabs in vial transport medium  Result Value Ref Range Status   SARS Coronavirus 2 by RT PCR POSITIVE (A) NEGATIVE Final    Comment: (NOTE) SARS-CoV-2 target nucleic acids are DETECTED.  The SARS-CoV-2 RNA is generally detectable in upper respiratory specimens during the acute phase of infection. Positive results are indicative of the presence of the identified virus, but do not rule out bacterial infection or co-infection with other pathogens not detected by the test. Clinical correlation with patient  history and other diagnostic information is necessary to determine patient infection status. The expected result is Negative.  Fact Sheet for Patients: EntrepreneurPulse.com.au  Fact Sheet for Healthcare Providers: IncredibleEmployment.be  This test is not yet approved or cleared by the [place] FDA and  has been authorized for detection and/or diagnosis of SARS-CoV-2 by FDA under an Emergency Use Authorization (EUA).  This EUA will remain in effect (meaning this test can be used) for the duration of  the COVID-19 declaration under Section 564(b)(1) of the A ct, 21 U.S.C. section 360bbb-3(b)(1), unless the authorization is terminated or revoked sooner.     Influenza A by PCR NEGATIVE NEGATIVE Final   Influenza B  by PCR NEGATIVE NEGATIVE Final    Comment: (NOTE) The Xpert Xpress SARS-CoV-2/FLU/RSV plus assay is intended as an aid in the diagnosis of influenza from Nasopharyngeal swab specimens and should not be used as a sole basis for treatment. Nasal washings and aspirates are unacceptable for Xpert Xpress SARS-CoV-2/FLU/RSV testing.  Fact Sheet for Patients: BloggerCourse.com  Fact Sheet for Healthcare Providers: SeriousBroker.it  This test is not yet approved or cleared by the [place] FDA and has been authorized for detection and/or diagnosis of SARS-CoV-2 by FDA under an Emergency Use Authorization (EUA). This EUA will remain in effect (meaning this test can be used) for the duration of the COVID-19 declaration under Section 564(b)(1) of the Act, 21 U.S.C. section 360bbb-3(b)(1), unless the authorization is terminated or revoked.  Performed at [facility], [address] [place], [place] [address]   Blood culture (routine x 2)     Status: None (Preliminary result)   Collection Time: 04/11/21  1:35 PM   Specimen: BLOOD  Result Value Ref Range Status    Specimen Description   Final    BLOOD LEFT WRIST Performed at [facility], [address] [place], [place] [address]    Special Requests   Final    BOTTLES DRAWN AEROBIC AND ANAEROBIC Blood Culture adequate volume Performed at [facility], [address], [place], [place] [address]    Culture   Final    NO GROWTH 3 DAYS Performed at [facility], [address], [place], [place] [address]    Report Status PENDING  Incomplete     Labs: BNP (last 3 results) Recent Labs    11/17/20 1757  BNP 87.9   Basic Metabolic Panel: Recent Labs  Lab 04/11/21 1550 04/12/21 0356 04/12/21 0719 04/13/21 0400  NA 131* 136  --  135  K 3.4* 3.2*  --  4.0  CL 103 106  --  103  CO2 18* 23  --  25  GLUCOSE 144* 116*  --  155*  BUN 33* 24*  --  17  CREATININE 0.64 0.74  --  0.65  CALCIUM 8.2* 9.0  --  9.1  MG  --   --  1.6*  --    Liver Function Tests: Recent Labs  Lab 04/11/21 1550 04/12/21 0356  AST 14* 14*  ALT 12 16  ALKPHOS 33* 38  BILITOT 0.4 0.8  PROT 5.1* 5.3*  ALBUMIN 2.8* 2.5*   Recent Labs  Lab 04/11/21 1550  LIPASE 10*   Recent Labs  Lab 04/12/21 0719  AMMONIA 12   CBC: Recent Labs  Lab 04/11/21 1151 04/12/21 0356 04/13/21 0400 04/14/21 0407  WBC 20.0* 15.7* 15.5* 16.6*  HGB 10.4* 8.1* 9.5* 9.5*  HCT 31.2* 24.8* 29.1* 30.0*  MCV 108.7* 109.7* 110.6* 112.8*  PLT 445* 333 314 301   Cardiac Enzymes: No results for input(s): CKTOTAL, CKMB, CKMBINDEX, TROPONINI in the last 168 hours. BNP: Invalid input(s): POCBNP CBG: Recent Labs  Lab 04/13/21 1126 04/13/21 1702 04/13/21 2113 04/14/21 0929 04/14/21 1151  GLUCAP 129* 150* 117* 129* 139*   D-Dimer No results for input(s): DDIMER in the last 72 hours. Hgb A1c No results for input(s): HGBA1C in the last 72 hours. Lipid Profile No results for input(s): CHOL, HDL, LDLCALC, TRIG, CHOLHDL, LDLDIRECT in the last 72 hours. Thyroid function  studies Recent Labs    04/12/21 0719  TSH 0.580   Anemia work up Recent Labs    04/12/21 0719  VITAMINB12 1,665*  FOLATE 22.3  FERRITIN 99  TIBC 246*  IRON 23*  RETICCTPCT 2.7   Urinalysis    Component Value Date/Time   COLORURINE YELLOW 04/11/2021 1143   APPEARANCEUR CLOUDY (A) 04/11/2021 1143   LABSPEC 1.024 04/11/2021 1143   PHURINE 6.0 04/11/2021 1143   GLUCOSEU NEGATIVE 04/11/2021 1143   GLUCOSEU NEGATIVE 07/17/2020 0939   HGBUR SMALL (A) 04/11/2021 1143   BILIRUBINUR NEGATIVE 04/11/2021 1143   BILIRUBINUR 1+ 06/13/2019 1531   KETONESUR NEGATIVE 04/11/2021 1143   PROTEINUR 30 (A) 04/11/2021 1143   UROBILINOGEN 0.2 07/17/2020 0939   NITRITE NEGATIVE 04/11/2021 1143   LEUKOCYTESUR LARGE (A) 04/11/2021 1143    Microbiology Recent Results (from the past 240 hour(s))  Urine Culture     Status: Abnormal   Collection Time: 04/11/21 12:31 PM   Specimen: Urine, Clean Catch  Result Value Ref Range Status   Specimen Description   Final    URINE, CLEAN CATCH Performed at [facility], [address], [place], [place] [address]    Special Requests   Final    NONE Performed at [facility], [address] [place], [place] [address]    Culture >=100,000 COLONIES/mL YEAST (A)  Final   Report Status 04/12/2021 FINAL  Final  Blood culture (routine x 2)     Status: None (Preliminary result)   Collection Time: 04/11/21  1:10 PM   Specimen: BLOOD  Result Value Ref Range Status   Specimen Description   Final    BLOOD BLOOD LEFT FOREARM Performed at [facility], [address], [place], [place] [address]    Special Requests   Final    BOTTLES DRAWN AEROBIC AND ANAEROBIC Blood Culture adequate volume Performed at [facility], [address], [place], [place] [address]    Culture   Final    NO GROWTH 3 DAYS Performed at [facility], [place] [address] [place], [place] [address]     Report Status PENDING  Incomplete  Resp Panel by RT-PCR (Flu A&B, Covid) Nasopharyngeal Swab     Status: Abnormal   Collection Time: 04/11/21  1:25 PM   Specimen: Nasopharyngeal Swab; Nasopharyngeal(NP) swabs in vial transport medium  Result Value Ref Range Status   SARS Coronavirus 2 by RT PCR POSITIVE (A) NEGATIVE Final    Comment: (NOTE) SARS-CoV-2 target nucleic acids are DETECTED.  The SARS-CoV-2 RNA is generally detectable in upper respiratory specimens during the acute phase of infection. Positive results are indicative of the presence of the identified virus, but do not rule out bacterial infection or co-infection with other pathogens not detected by the test. Clinical correlation with patient history and other diagnostic information is necessary to determine patient infection status. The expected result is Negative.  Fact Sheet for Patients: EntrepreneurPulse.com.au  Fact Sheet for Healthcare Providers: IncredibleEmployment.be  This test is not yet approved or cleared by the [place] FDA and  has been authorized for detection and/or diagnosis of SARS-CoV-2 by FDA under an Emergency Use Authorization (EUA).  This EUA will remain in effect (meaning this test can be used) for the duration of  the COVID-19 declaration under Section 564(b)(1) of the A ct, 21 U.S.C. section 360bbb-3(b)(1), unless the authorization is terminated or revoked sooner.     Influenza A by PCR NEGATIVE NEGATIVE Final   Influenza B by PCR NEGATIVE NEGATIVE Final    Comment: (NOTE) The Xpert Xpress SARS-CoV-2/FLU/RSV plus assay is intended as an aid in the  diagnosis of influenza from Nasopharyngeal swab specimens and should not be used as a sole basis for treatment. Nasal washings and aspirates are unacceptable for Xpert Xpress SARS-CoV-2/FLU/RSV testing.  Fact Sheet for Patients: EntrepreneurPulse.com.au  Fact Sheet for Healthcare  Providers: IncredibleEmployment.be  This test is not yet approved or cleared by the [place] FDA and has been authorized for detection and/or diagnosis of SARS-CoV-2 by FDA under an Emergency Use Authorization (EUA). This EUA will remain in effect (meaning this test can be used) for the duration of the COVID-19 declaration under Section 564(b)(1) of the Act, 21 U.S.C. section 360bbb-3(b)(1), unless the authorization is terminated or revoked.  Performed at [facility], [address], [place], [place] [address]   Blood culture (routine x 2)     Status: None (Preliminary result)   Collection Time: 04/11/21  1:35 PM   Specimen: BLOOD  Result Value Ref Range Status   Specimen Description   Final    BLOOD LEFT WRIST Performed at [facility], [address] [place], [place] [address]    Special Requests   Final    BOTTLES DRAWN AEROBIC AND ANAEROBIC Blood Culture adequate volume Performed at [facility], [address] [place], [place] [address]    Culture   Final    NO GROWTH 3 DAYS Performed at [facility], [place] [address], [place], [place] [address]    Report Status PENDING  Incomplete     Time coordinating discharge: 35 minutes  SIGNED:   Cordelia Poche, MD [facility] 04/14/2021, 11:57 AM

## 2021-04-14 NOTE — Discharge Instructions (Signed)
Kayla Thompson,  You were in the hospital with a urinary/bladder infection. This appears to be a yeast infection. Please continue your Fluconazole on discharge.  While you were here, there was a question about your blood infection and continuing antibiotics. I discussed with Dr. Linus Salmons and he recommends resuming doxycycline and completing your course.  Your white blood cell count is elevated. This has been persistent for several years. Thankfully, this is mild.

## 2021-04-14 NOTE — Assessment & Plan Note (Signed)
History. Present prior to admission. Patient is on doxycycline as an outpatient. Clarified with ID, Dr. Linus Salmons who recommends patient continue 2 week treatment course of doxycycline. Blood cultures with no growth (x3 days) this admission.

## 2021-04-14 NOTE — Assessment & Plan Note (Signed)
Persistently elevated for years. May benefit from smear review.no associated fevers or concerning symptoms/signs.

## 2021-04-15 ENCOUNTER — Telehealth: Payer: Self-pay

## 2021-04-15 NOTE — Telephone Encounter (Signed)
Transition Care Management Follow-up Telephone Call Date of discharge and from where: [facility] 04-14-21 Dx: Sepsis How have you been since you were released from the hospital? Very weak and tired  Any questions or concerns? Yes- discuss medications-pt aggravated with the amount of medications that she takes- wondering if all of them are necessary   Items Reviewed: Did the pt receive and understand the discharge instructions provided? Yes  Medications obtained and verified? Yes  Other? No  Any new allergies since your discharge? No  Dietary orders reviewed? Yes Do you have support at home? Yes   Home Care and Equipment/Supplies: Were home health services ordered? No- but daughter would like [facility] to come back  If so, what is the name of the agency? na  Has the agency set up a time to come to the patient's home? not applicable Were any new equipment or medical supplies ordered?  No What is the name of the medical supply agency? na Were you able to get the supplies/equipment? not applicable Do you have any questions related to the use of the equipment or supplies? No  Functional Questionnaire: (I = Independent and D = Dependent) ADLs: I  Bathing/Dressing- I  Meal Prep- D  Eating- I  Maintaining continence- D  Transferring/Ambulation- D  Managing Meds- D  Follow up appointments reviewed:  PCP Hospital f/u appt confirmed? Yes  Scheduled to see Dr Sharlet Salina on 04-20-21 @ 320pm. [facility] f/u appt confirmed? No . Are transportation arrangements needed? No  If their condition worsens, is the pt aware to call PCP or go to the Emergency Dept.? Yes Was the patient provided with contact information for the PCP's office or ED? Yes Was to pt encouraged to call back with questions or concerns? Yes

## 2021-04-16 ENCOUNTER — Telehealth: Payer: Self-pay

## 2021-04-16 LAB — CULTURE, BLOOD (ROUTINE X 2)
Culture: NO GROWTH
Culture: NO GROWTH
Special Requests: ADEQUATE
Special Requests: ADEQUATE

## 2021-04-16 NOTE — Telephone Encounter (Signed)
Attempted to contact patient to schedule a Palliative Care consult appointment. No answer left a message to return call.  

## 2021-04-17 DIAGNOSIS — E1151 Type 2 diabetes mellitus with diabetic peripheral angiopathy without gangrene: Secondary | ICD-10-CM | POA: Diagnosis not present

## 2021-04-17 DIAGNOSIS — G9341 Metabolic encephalopathy: Secondary | ICD-10-CM | POA: Diagnosis not present

## 2021-04-17 DIAGNOSIS — I110 Hypertensive heart disease with heart failure: Secondary | ICD-10-CM | POA: Diagnosis not present

## 2021-04-17 DIAGNOSIS — U071 COVID-19: Secondary | ICD-10-CM | POA: Diagnosis not present

## 2021-04-17 DIAGNOSIS — A401 Sepsis due to streptococcus, group B: Secondary | ICD-10-CM | POA: Diagnosis not present

## 2021-04-17 DIAGNOSIS — N390 Urinary tract infection, site not specified: Secondary | ICD-10-CM | POA: Diagnosis not present

## 2021-04-20 ENCOUNTER — Encounter: Payer: Self-pay | Admitting: Internal Medicine

## 2021-04-20 ENCOUNTER — Other Ambulatory Visit: Payer: Self-pay

## 2021-04-20 ENCOUNTER — Ambulatory Visit (INDEPENDENT_AMBULATORY_CARE_PROVIDER_SITE_OTHER): Payer: Medicare Other | Admitting: Internal Medicine

## 2021-04-20 VITALS — BP 126/90 | HR 58 | Ht 59.0 in

## 2021-04-20 VITALS — BP 110/58 | HR 50 | Ht 59.0 in | Wt 89.0 lb

## 2021-04-20 DIAGNOSIS — I5032 Chronic diastolic (congestive) heart failure: Secondary | ICD-10-CM

## 2021-04-20 DIAGNOSIS — E559 Vitamin D deficiency, unspecified: Secondary | ICD-10-CM | POA: Diagnosis not present

## 2021-04-20 DIAGNOSIS — E039 Hypothyroidism, unspecified: Secondary | ICD-10-CM

## 2021-04-20 DIAGNOSIS — M818 Other osteoporosis without current pathological fracture: Secondary | ICD-10-CM

## 2021-04-20 DIAGNOSIS — E119 Type 2 diabetes mellitus without complications: Secondary | ICD-10-CM | POA: Diagnosis not present

## 2021-04-20 DIAGNOSIS — J8410 Pulmonary fibrosis, unspecified: Secondary | ICD-10-CM | POA: Diagnosis not present

## 2021-04-20 DIAGNOSIS — M353 Polymyalgia rheumatica: Secondary | ICD-10-CM

## 2021-04-20 DIAGNOSIS — D509 Iron deficiency anemia, unspecified: Secondary | ICD-10-CM

## 2021-04-20 DIAGNOSIS — I10 Essential (primary) hypertension: Secondary | ICD-10-CM | POA: Diagnosis not present

## 2021-04-20 DIAGNOSIS — R7881 Bacteremia: Secondary | ICD-10-CM | POA: Diagnosis not present

## 2021-04-20 DIAGNOSIS — E43 Unspecified severe protein-calorie malnutrition: Secondary | ICD-10-CM

## 2021-04-20 DIAGNOSIS — B3741 Candidal cystitis and urethritis: Secondary | ICD-10-CM | POA: Diagnosis not present

## 2021-04-20 DIAGNOSIS — K5641 Fecal impaction: Secondary | ICD-10-CM

## 2021-04-20 DIAGNOSIS — D72828 Other elevated white blood cell count: Secondary | ICD-10-CM

## 2021-04-20 DIAGNOSIS — R252 Cramp and spasm: Secondary | ICD-10-CM

## 2021-04-20 LAB — CBC
HCT: 32.1 % — ABNORMAL LOW (ref 36.0–46.0)
Hemoglobin: 10.2 g/dL — ABNORMAL LOW (ref 12.0–15.0)
MCHC: 31.8 g/dL (ref 30.0–36.0)
MCV: 109.1 fl — ABNORMAL HIGH (ref 78.0–100.0)
Platelets: 344 10*3/uL (ref 150.0–400.0)
RBC: 2.94 Mil/uL — ABNORMAL LOW (ref 3.87–5.11)
RDW: 15.6 % — ABNORMAL HIGH (ref 11.5–15.5)
WBC: 12.4 10*3/uL — ABNORMAL HIGH (ref 4.0–10.5)

## 2021-04-20 NOTE — Telephone Encounter (Signed)
Patient is wanting to do prolia however it is now 2023 does she need to start this process again? Last prolia 05/19/20

## 2021-04-20 NOTE — Progress Notes (Signed)
° °  Subjective:   Patient ID: Kayla Thompson, female    DOB: 05/16/1935, 86 y.o.   MRN: [id number]  HPI The patient is an 86 YO female coming in for hospital follow up (in for sepsis from possible fungal uti and recent covid-19 infection and prior bacteremia with prior hospital stay). She is still taking antibiotic for bacteremia another day and fungal treatment for another week or so. Still confused intermittently at home. Very weak and barely getting around. Daughter is present and gives all history and is living with patient most of the time now.   PMH, [facility], social history reviewed and updated.   Discharge summary, labs, imaging, notes reviewed from multiple hospital stays including most recent discharge.   Review of Systems  Constitutional:  Positive for activity change, appetite change, fatigue and unexpected weight change.  HENT: Negative.    Eyes: Negative.   Respiratory:  Positive for cough and shortness of breath. Negative for chest tightness.        Chronic  Cardiovascular:  Negative for chest pain, palpitations and leg swelling.  Gastrointestinal:  Positive for constipation. Negative for abdominal distention, abdominal pain, diarrhea, nausea and vomiting.  Musculoskeletal:  Positive for arthralgias, back pain, gait problem and myalgias.  Skin: Negative.   Neurological:  Positive for weakness.  Psychiatric/Behavioral: Negative.     Objective:  Physical Exam Constitutional:      Appearance: She is well-developed.     Comments: Chronically ill appearing  HENT:     Head: Normocephalic and atraumatic.  Cardiovascular:     Rate and Rhythm: Normal rate and regular rhythm.  Pulmonary:     Effort: Pulmonary effort is normal. No respiratory distress.     Breath sounds: Normal breath sounds. No wheezing or rales.  Abdominal:     General: Bowel sounds are normal. There is no distension.     Palpations: Abdomen is soft.     Tenderness: There is no abdominal tenderness. There is no  rebound.  Musculoskeletal:     Cervical back: Normal range of motion.  Skin:    General: Skin is warm and dry.  Neurological:     Mental Status: She is alert.     Coordination: Coordination abnormal.     Comments: Disengaged during conversation which is not usual for her, oriented to person    Vitals:   04/20/21 1501  BP: 126/90  Pulse: (!) 58  Height: 4\' 11"  (1.499 m)    This visit occurred during the SARS-CoV-2 public health emergency.  Safety protocols were in place, including screening questions prior to the visit, additional usage of staff PPE, and extensive cleaning of exam room while observing appropriate contact time as indicated for disinfecting solutions.   Assessment & Plan:

## 2021-04-20 NOTE — Patient Instructions (Addendum)
We will check the labs today. We can check on the prolia to get this scheduled.

## 2021-04-20 NOTE — Patient Instructions (Addendum)
Please continue: - Lantus 5 units mid-day  You can stay off: - Humalog  Continue levothyroxine 50 mcg daily.  Take the thyroid hormone every day, with water, at least 30 minutes before breakfast, separated by at least 4 hours from: - acid reflux medications - calcium - iron - multivitamins  Continue vitamin D 1000 units daily.  Please come back for a follow-up appointment in 6 months.

## 2021-04-20 NOTE — Progress Notes (Addendum)
Patient ID: Kayla Thompson, female   DOB: 06/03/1935, 86 y.o.   MRN: [id number]  This visit occurred during the SARS-CoV-2 public health emergency.  Safety protocols were in place, including screening questions prior to the visit, additional usage of staff PPE, and extensive cleaning of exam room while observing appropriate contact time as indicated for disinfecting solutions.   HPI  Kayla Thompson is a 86 y.o.-year-old female, initially referred by her OB/GYN doctor, Dr. Uvaldo Rising, for evaluation for hyperparathyroidism + normo/hypo-calcemia, vitamin D deficiency,  and DM2, dx 2010, insulin-dependent since 2010, controlled, w/o long-term complications.  Last visit 4 months ago.   At today's visit, she is here with her daughter.  She offers all the info about the medications, diet, activity, and recent hospitalizations..  Interim history: No increased urination, blurry vision, nausea, chest pain. No falls or fractures since last visit. Since last visit, she was admitted on 04/11/2021 for sepsis after having had COVID-19.  However, sepsis was due to UTI or yeast cystitis per review of the notes. She is now under palliative care.  They would like her to stop her mealtime insulin. Daughter tells me that she did not take Humalog for >1 mo as she did not need it.  Reviewed history: Pt was dx with hyperparathyroidism in 08/2014.  Reviewed pertinent labs: Lab Results  Component Value Date   PTH 37 06/09/2015   PTH Comment 06/09/2015   PTH 73 (H) 10/28/2014   PTH Comment 10/28/2014   PTH 190 (H) 09/10/2014   PTH 243 (H) 08/23/2014   CALCIUM 9.1 04/13/2021   CALCIUM 9.0 04/12/2021   CALCIUM 8.2 (L) 04/11/2021   CALCIUM 9.3 03/31/2021   CALCIUM 9.0 03/29/2021   CALCIUM 8.8 (L) 03/28/2021   CALCIUM 8.6 (L) 03/27/2021   CALCIUM 10.0 03/26/2021   CALCIUM 9.2 01/11/2021   CALCIUM 9.7 01/10/2021   Her osteoporosis was diagnosed in 2007.  She had few falls.  Reviewed previous DXA scan  report: Date L1-L2 (L3 and L4 excluded) T score FN T score  01/05/2018 ([facility])  +0.4  LFN: -2.8  07/25/2014   -0.1  LFN: -2.7  Right hip could not be analyzed due to previous instrumentation.  She has a history of multiple fractures in: - 09/23/1990: pelvic fx - 02/2005: R hip fx - rod - 05/2006: rib fx - 12/2008: 3 R ankle fx - in [place] - plate and screws - 25/6389: wrist fx's 01/2012: steroid inj's R knee - 07/2014: forearm fx and nasal bone fx - 09/2019: Chest x-ray: Age-indeterminate fractures of the third and fourth ribs  She started Prolia in 2017.   Per PCP. She is tolerating this well.   No h/o kidney stones.  No CKD. Last BUN/Cr: Lab Results  Component Value Date   BUN 17 04/13/2021   CREATININE 0.65 04/13/2021   She is not on HCTZ.  No FH of hypercalcemia, pituitary tumors, thyroid cancer, or osteoporosis.   Vitamin D level:   Lab Results  Component Value Date   VD25OH 89.5 06/12/2020   VD25OH 101.47 (HH) 02/19/2020   VD25OH 99.3 12/14/2019   VD25OH 72.23 10/12/2018   VD25OH 81.19 06/09/2015   VD25OH 65.60 01/28/2015   VD25OH 48 08/23/2014  - we decreased the dose of vitamin D from 4000 to 2000 units in 11/2019. She is now on 1000 units.  A 24-hour urine calcium was normal: Component     Latest Ref Rng 03/31/2015  Calcium, Ur     Not  estab mg/dL 9  Calcium, 24 hour urine     35 - 250 mg/24 h 108  Creatinine, Urine     20 - 320 mg/dL 78  Creatinine, 24H Ur     0.63 - 2.50 g/24 h 0.94   DM2: - dx 2010 >> she started insulin and diagnosis.  Reviewed HbA1c levels: Lab Results  Component Value Date   HGBA1C 6.5 (H) 03/27/2021   HGBA1C 6.4 (A) 12/18/2020   HGBA1C 5.6 07/07/2020   She is on: - Lantus 30 >> 25 >> 20 >> 15 >> 11 >> 8 >> 5  units midday Previously on: Humalog 4-6 >> Novolog 4 units before breakfast only >> stopped 09/2018 >> restarted Humalog 2 to 4 units before dinner >> last dose 1 mo ago.  She checks sugars 2x a day: - am:  48, 52, 63-136, 169 >> 74, 112-187, 218 >> 98, 114-150, 168 - after lunch: 120 >> 116-154 >> n/c  - after dinner: 132 >> 70 >> n/c - bedtime: 84-190s >> 89-256 >> 174, 193-337 >> 130-197 Lowest: 91 >> 84 >> 74 >> 98 Highest: 194 >> 190s >> 337 >> 197.  -+ HL; latest lipids:  Lab Results  Component Value Date   CHOL 132 10/16/2019   HDL 55 10/16/2019   LDLCALC 57 10/16/2019   TRIG 114 10/16/2019   CHOLHDL 2.4 10/16/2019  On Crestor 10.  + Mild CKD;. Latest BUN/Cr: Lab Results  Component Value Date   BUN 17 04/13/2021   Lab Results  Component Value Date   CREATININE 0.65 04/13/2021   Last dilated eye exam: 11/13/2020: No DR; Dr Gershon Crane.  She has a history of chalazion, has a history of cataract surgery.  She also has a history of Ovarian cancer - 1996, urinary incontinence, HTN.  She has a history of hypothyroidism: Lab Results  Component Value Date   TSH 0.580 04/12/2021   TSH 0.118 (L) 03/27/2021   TSH 1.342 01/08/2021   TSH 0.681 05/07/2020   TSH 0.77 02/19/2020   TSH 0.76 10/16/2019   TSH 0.110 (L) 01/27/2019   TSH 0.85 10/12/2018   TSH 0.23 (L) 12/01/2016   TSH 0.61 07/15/2016   TSH 0.61 10/21/2015   TSH 0.36 03/03/2015   TSH 0.591 08/02/2014   She is very reticent to decrease the dose of levothyroxine due to fear of hair loss.  At last visit she was on levothyroxine 50 mcg daily.  In 01/2019, her TSH was suppressed and I again advised her to decrease the dose to 25 g daily.  She did not do so.  Pt takes the levothyroxine: - in am - fasting - at least 30 min from b'fast  - + Ca, Fe, MVI, PPIs  - moved >4h after levothyroxine at last visit - not on Biotin  Pt denies: - feeling nodules in neck - hoarseness - choking - SOB with lying down But does have chronic dysphagia  ROS:  Cardiovascular: no CP/no SOB/no palpitations/+ leg swelling Neurological: + tremors/no numbness/no tingling/no dizziness  I reviewed pt's medications, allergies, PMH, social  hx, family hx, and changes were documented in the history of present illness. Otherwise, unchanged from my initial visit note.  Past Medical History:  Diagnosis Date   Anxiety    Arthritis    Asthma    Cataracts, bilateral    removed by surgery   COPD (chronic obstructive pulmonary disease) ([place])    Depression    Diabetes mellitus without complication ([place])  type 2   Dyspnea    with exertion   Fibromyalgia    GERD (gastroesophageal reflux disease)    History of chemotherapy    History of fractured pelvis    Hypertension    Hypothyroidism    Osteoporosis 12/2017   T score -2.8 stable from prior DEXA   Osteoporosis    Ovarian cancer ([place])    Pneumonia    several times   Past Surgical History:  Procedure Laterality Date   ABDOMINAL HYSTERECTOMY  1996   ANKLE FRACTURE SURGERY     plate and screws   BIOPSY  02/05/2019   Procedure: BIOPSY;  Surgeon: Irving Copas., MD;  Location: [facility] ENDOSCOPY;  Service: Gastroenterology;;   BLADDER SUSPENSION     CATARACT EXTRACTION     COLONOSCOPY     ESOPHAGEAL BRUSHING  02/05/2019   Procedure: ESOPHAGEAL BRUSHING;  Surgeon: Irving Copas., MD;  Location: [facility] ENDOSCOPY;  Service: Gastroenterology;;   ESOPHAGOGASTRODUODENOSCOPY (EGD) WITH PROPOFOL N/A 02/05/2019   Procedure: ESOPHAGOGASTRODUODENOSCOPY (EGD) WITH PROPOFOL with dialtion;  Surgeon: Irving Copas., MD;  Location: [place];  Service: Gastroenterology;  Laterality: N/A;   EYE SURGERY     cataracts removed-bilateral   hip surgey     knee surgey     LUNG SURGERY     OOPHORECTOMY     BSO   SAVORY DILATION N/A 02/05/2019   Procedure: SAVORY DILATION;  Surgeon: Irving Copas., MD;  Location: [place];  Service: Gastroenterology;  Laterality: N/A;   TONSILLECTOMY     UPPER GI ENDOSCOPY     History   Social History   Marital Status: Widowed    Spouse Name: N/A   Number of Children: 2 (1 adopted)   Occupational History   retired    Social History Main Topics   Smoking status: Never Smoker    Smokeless tobacco: Never Used   Alcohol Use: No   Drug Use: No   Current Outpatient Medications on File Prior to Visit  Medication Sig Dispense Refill   acetaminophen (TYLENOL) 650 MG CR tablet Take 1 tablet (650 mg total) by mouth See admin instructions. Take 1,300 mg by mouth every 4-5 hours IN CONJUNCTION WITH one Percocet 7.5/325 tablet- for pain     Apoaequorin 10 MG CAPS Take 10 mg by mouth every evening.     Ascorbic Acid (VITAMIN C) 1000 MG tablet Take 1,000 mg by mouth in the morning and at bedtime.     baclofen (LIORESAL) 10 MG tablet TAKE 0.5-1 TABLET BY MOUTH TWICE DAILY AS NEEDED FOR MUSCLE SPASMS OR PAIN (Patient taking differently: Take 10 mg by mouth at bedtime.) 180 tablet 1   BD PEN NEEDLE NANO 2ND GEN 32G X 4 MM MISC USE AS DIRECTED 4 TIMES A DAY 100 each 5   Calcium Carbonate (CALTRATE 600 PO) Take 600 mg by mouth in the morning and at bedtime.     cholecalciferol (VITAMIN D3) 25 MCG (1000 UNIT) tablet Take 1,000 Units by mouth daily.     Cyanocobalamin (VITAMIN B 12 PO) Take 2,500 mcg by mouth every Thursday.     denosumab (PROLIA) 60 MG/ML SOSY injection Inject 60 mg into the skin every 6 (six) months.     Dextromethorphan-guaiFENesin (MUCINEX DM MAXIMUM STRENGTH) 60-1200 MG TB12 Take 1 tablet by mouth in the morning and at bedtime.     docusate sodium (COLACE) 100 MG capsule Take 1 capsule (100 mg total) by mouth daily. 90 capsule 3  doxycycline (VIBRAMYCIN) 100 MG capsule Take 1 capsule (100 mg total) by mouth 2 (two) times daily. 28 capsule 0   DULoxetine (CYMBALTA) 30 MG capsule Take 1 capsule (30 mg total) by mouth daily. (Patient taking differently: Take 30 mg by mouth in the morning.) 90 capsule 3   DULoxetine (CYMBALTA) 60 MG capsule Take 1 capsule (60 mg total) by mouth daily. (Patient taking differently: Take 60 mg by mouth in the morning.) 90 capsule 3   estradiol (ESTRACE) 0.1 MG/GM vaginal cream  Place 1 Applicatorful vaginally See admin instructions. Takes 2 times a week     feeding supplement (ENSURE ENLIVE / ENSURE PLUS) LIQD Take 237 mLs by mouth 2 (two) times daily between meals. (Patient taking differently: Take 237 mLs by mouth 3 (three) times daily with meals.) 237 mL 12   fluconazole (DIFLUCAN) 100 MG tablet Take 1 tablet (100 mg total) by mouth daily for 12 days. 12 tablet 0   gabapentin (NEURONTIN) 100 MG capsule Take 100 mg by mouth at bedtime.     glucose blood (FREESTYLE LITE) test strip USE TO TEST BLOOD SUGAR 3 TIMES DAILY. 100 strip 5   insulin glargine (LANTUS) 100 UNIT/ML Solostar Pen Inject 5 Units into the skin in the morning.     ipratropium-albuterol (DUONEB) 0.5-2.5 (3) MG/3ML SOLN Inhale 3 mLs into the lungs every 4 (four) hours as needed. (Patient taking differently: Inhale 3 mLs into the lungs 3 (three) times daily.) 90 mL 11   Lancets (FREESTYLE) lancets Use to test blood sugar 3 times daily. Dx: E11.65 100 each 3   levothyroxine (SYNTHROID) 50 MCG tablet TAKE 1 TABLET BY MOUTH EVERY DAY BEFORE BREAKFAST (Patient taking differently: Take 50 mcg by mouth daily before breakfast.) 90 tablet 1   linaclotide (LINZESS) 72 MCG capsule Take 1 capsule (72 mcg total) by mouth daily before breakfast. 90 capsule 3   methenamine (MANDELAMINE) 1 g tablet Take 1,000 mg by mouth 2 (two) times daily.     mirtazapine (REMERON) 15 MG tablet TAKE 1 TABLET BY MOUTH EVERYDAY AT BEDTIME (Patient taking differently: Take 15 mg by mouth at bedtime.) 90 tablet 2   Misc Natural Products (TART CHERRY ADVANCED PO) Take 1,000 mg by mouth every evening.     montelukast (SINGULAIR) 10 MG tablet TAKE 1 TABLET BY MOUTH EVERY DAY (Patient taking differently: Take 10 mg by mouth at bedtime.) 90 tablet 2   naloxone (NARCAN) nasal spray 4 mg/0.1 mL Place 1 spray into the nose once as needed (overdose).     nitrofurantoin, macrocrystal-monohydrate, (MACROBID) 100 MG capsule Take 100 mg by mouth in the  morning.     omeprazole (PRILOSEC) 20 MG capsule TAKE 1 CAPSULE TWICE A DAY BEFORE A MEAL (Patient taking differently: Take 20 mg by mouth 2 (two) times daily before a meal.) 180 capsule 1   oxyCODONE-acetaminophen (PERCOCET) 7.5-325 MG tablet Take 1 tablet by mouth See admin instructions. Take 1 tablet by mouth every 4-5 hours (IN CONJUNCTION WITH 1,300 mg of Tylenol). Max 5 tab/day.     polyethylene glycol (MIRALAX / GLYCOLAX) 17 g packet Take 17 g by mouth 2 (two) times daily. Decrease to daily or every other day pending on bowel movement frequency/consistency 30 each 0   predniSONE (DELTASONE) 1 MG tablet Take 1 tablet (1 mg total) by mouth in the morning and at bedtime. (Patient taking differently: Take 2 mg by mouth daily at 6 (six) AM.)     rosuvastatin (CRESTOR) 10 MG tablet  Take 1 tablet (10 mg total) by mouth every evening. 90 tablet 3   TRELEGY ELLIPTA 100-62.5-25 MCG/INH AEPB INHALE 1 PUFF INTO THE LUNGS DAILY. RINSE MOUTH (Patient taking differently: Inhale 1 puff into the lungs in the morning.) 60 each 11   TURMERIC PO Take 1,000 mg by mouth in the morning.     No current facility-administered medications on file prior to visit.   Allergies  Allergen Reactions   Tape Other (See Comments)    SKIN IS VERY THIN AND DELICATE- will tear like paper!!   Lasix [Furosemide] Other (See Comments)    Confusion- MD stopped this med   Latex Rash   Penicillins Rash    Has patient had a PCN reaction causing immediate rash, facial/tongue/throat swelling, SOB or lightheadedness with hypotension: No Has patient had a PCN reaction causing severe rash involving mucus membranes or skin necrosis: No Has patient had a PCN reaction that required hospitalization: No Has patient had a PCN reaction occurring within the last 10 years: No If all of the above answers are "NO", then may proceed with Cephalosporin use.    Family History  Problem Relation Age of Onset   Lung cancer Mother    CAD Father     Diabetes Father    Lung cancer Maternal Aunt    Diabetes Paternal Aunt    Diabetes Paternal Uncle    Diabetes Paternal Grandmother    Diabetes Paternal Grandfather    Colon cancer Neg Hx    Esophageal cancer Neg Hx    Inflammatory bowel disease Neg Hx    Liver disease Neg Hx    Pancreatic cancer Neg Hx    Rectal cancer Neg Hx    Stomach cancer Neg Hx    PE: BP (!) 110/58 (BP Location: Right Arm, Patient Position: Sitting, Cuff Size: Normal)    Pulse (!) 50    Ht 4\' 11"  (1.499 m)    Wt 89 lb (40.4 kg)    LMP  (LMP Unknown)    SpO2 (!) 70%    BMI 17.98 kg/m   Wt Readings from Last 3 Encounters:  04/20/21 89 lb (40.4 kg)  04/11/21 89 lb 11.6 oz (40.7 kg)  03/26/21 92 lb 4.8 oz (41.9 kg)   Constitutional: normal weight, in NAD Eyes: PERRLA, EOMI, no exophthalmos ENT: moist mucous membranes, no thyromegaly, no cervical lymphadenopathy Cardiovascular: tachycardia, RR, No MRG, + bilateral lower extremity edema Respiratory: CTA B Musculoskeletal: + finger deformities (Heberden and Bouchard nodules), strength intact in all 4 Skin: moist, warm, no rashes Neurological: + tremor with outstretched hands, DTR normal in all 4  Assessment: 1. H/o Hyperparathyroidism - recent normocalcemia  2. Vitamin D def  3. DM2, insulin-dependent, controlled, without complications  4. Low TSH  5.  Osteoporosis  Plan: 1. HPTH and 2. Vit D def -Patient with history of hyperparathyroidism in the setting of low calcium, with PTH levels normalizing after correction of calcium levels.  She continues on Prolia injections, calcium, vitamin D. -She did have elevated vitamin D levels in the past, on supplementation -At last visit, she was on 2000 units daily, but now daughter tells me that she is taking 1000 units daily only -Repeat vitamin D level was 89.5 in 05/2020. -She is due for another vitamin D level -she has an appointment with PCP coming up and I recommended to also have a vitamin D level drawn at  that time  3. DM2 - Patient with longstanding, uncontrolled, type 2 diabetes, on  basal-bolus insulin regimen.  At last visit, sugars were close to goal in the morning but they were very high at night, up to 300s.  At that time, I advised the patient to use 2 to 4 units of Humalog before dinner, but also check some sugars midday or after lunch and see if she needs to take some Humalog before this meal, also. -At this visit, patient's daughter tells me that she is now living with her mother, taking care of her and her sugars have improved, only increasing up to 190s after dinner.  She has not been using Humalog for almost a month now.  Therefore, for now, we discussed about continuing without Humalog but only use this as needed if sugars increase to upper 200s or 300s.  We will continue Lantus for now. -Of note, latest HbA1c obtained earlier this month was excellent, at 6.5%, only slightly higher than before - I advised her to  Patient Instructions  Please continue: - Lantus 5 units mid-day  You can stay off: - Humalog  Continue levothyroxine 50 mcg daily.  Take the thyroid hormone every day, with water, at least 30 minutes before breakfast, separated by at least 4 hours from: - acid reflux medications - calcium - iron - multivitamins  Continue vitamin D 1000 units daily.  Please come back for a follow-up appointment in 6 months.  - advised to check sugars at different times of the day - 3x a day, rotating check times - advised for yearly eye exams >> she is UTD - return to clinic in 4 months  4.  Hypothyroidism - latest thyroid labs reviewed with pt. >> normal: Lab Results  Component Value Date   TSH 0.580 04/12/2021  - she continues on LT4 50 mcg daily - pt feels good on this dose. - we discussed about taking the thyroid hormone every day, with water, >30 minutes before breakfast, separated by >4 hours from acid reflux medications, calcium, iron, multivitamins. Pt. is taking it  correctly.  5.  Osteoporosis -Reviewed her bone density results from 12/2017: Left femoral neck T score was -2.8, only slightly lower compared to 2016, when he was -2.6.  The right femoral neck could not be analyzed due to hardware. -She continues on Prolia in PCPs office-tolerated well -In 2021 she had a chest x-ray which showed age indeterminate rib fractures of the third and fourth ribs.  Also, a left hip x-ray showed chronic fracture deformities in the bilateral superior and inferior pubic rami (these were previously known). -She is now in palliative care, I do not think that she needs new DXA scans.  Per PCP.  Philemon Kingdom, MD PhD [facility] Endocrinology

## 2021-04-21 ENCOUNTER — Telehealth: Payer: Self-pay

## 2021-04-21 DIAGNOSIS — U071 COVID-19: Secondary | ICD-10-CM | POA: Diagnosis not present

## 2021-04-21 DIAGNOSIS — E1151 Type 2 diabetes mellitus with diabetic peripheral angiopathy without gangrene: Secondary | ICD-10-CM | POA: Diagnosis not present

## 2021-04-21 DIAGNOSIS — A401 Sepsis due to streptococcus, group B: Secondary | ICD-10-CM | POA: Diagnosis not present

## 2021-04-21 DIAGNOSIS — N390 Urinary tract infection, site not specified: Secondary | ICD-10-CM | POA: Diagnosis not present

## 2021-04-21 DIAGNOSIS — I110 Hypertensive heart disease with heart failure: Secondary | ICD-10-CM | POA: Diagnosis not present

## 2021-04-21 DIAGNOSIS — G9341 Metabolic encephalopathy: Secondary | ICD-10-CM | POA: Diagnosis not present

## 2021-04-21 LAB — COMPREHENSIVE METABOLIC PANEL
ALT: 18 U/L (ref 0–35)
AST: 16 U/L (ref 0–37)
Albumin: 3.7 g/dL (ref 3.5–5.2)
Alkaline Phosphatase: 46 U/L (ref 39–117)
BUN: 28 mg/dL — ABNORMAL HIGH (ref 6–23)
CO2: 23 mEq/L (ref 19–32)
Calcium: 10.1 mg/dL (ref 8.4–10.5)
Chloride: 101 mEq/L (ref 96–112)
Creatinine, Ser: 0.98 mg/dL (ref 0.40–1.20)
GFR: 53.25 mL/min — ABNORMAL LOW (ref >60.00)
Glucose, Bld: 144 mg/dL — ABNORMAL HIGH (ref 70–99)
Potassium: 4.6 mEq/L (ref 3.5–5.1)
Sodium: 136 mEq/L (ref 135–145)
Total Bilirubin: 0.3 mg/dL (ref 0.2–1.2)
Total Protein: 6.7 g/dL (ref 6.0–8.3)

## 2021-04-21 LAB — VITAMIN D 25 HYDROXY (VIT D DEFICIENCY, FRACTURES): VITD: >120 ng/mL

## 2021-04-21 NOTE — Assessment & Plan Note (Signed)
No current but struggling with constipation due to chronic opioids. We talked about how this can contribute to UTI formation. They are doing miralax and colace and linzess to help.

## 2021-04-21 NOTE — Assessment & Plan Note (Signed)
Keep gabapentin at night time and will move baclofen to prn only.

## 2021-04-21 NOTE — Assessment & Plan Note (Signed)
No flare today.  

## 2021-04-21 NOTE — Telephone Encounter (Signed)
Call daughter, ask her to stop vitamin D altogether as it is high. Rest of results will follow later today.

## 2021-04-21 NOTE — Assessment & Plan Note (Signed)
Checking CBC as this was rising at discharge.

## 2021-04-21 NOTE — Assessment & Plan Note (Signed)
Taking diflucan and will finish this course. Then we can recheck urine and culture.

## 2021-04-21 NOTE — Assessment & Plan Note (Signed)
Checking vitamin D level for endo as well as for possible d/c of vitamin D over the counter. Daughter is looking to simplify regimen as swallowing pills is difficult for patient.

## 2021-04-21 NOTE — Telephone Encounter (Signed)
CRITICAL VALUE STICKER  CRITICAL VALUE: Vit D > 120  RECEIVER (on-site recipient of call): Lynn Sissel, CMA  DATE & TIME NOTIFIED: 1/31 @ 7.36am  MESSENGER (representative from lab): Hope  MD NOTIFIED: Sharlet Salina  TIME OF NOTIFICATION: 7.37am

## 2021-04-21 NOTE — Assessment & Plan Note (Signed)
Discussed need to continue synthroid which daughter is aware of.

## 2021-04-21 NOTE — Assessment & Plan Note (Signed)
Overall stable, using inhaler trelegy and nebulizers and these will continue daughter is aware.

## 2021-04-21 NOTE — Assessment & Plan Note (Signed)
Encouraged to still do boost/ensure.

## 2021-04-21 NOTE — Assessment & Plan Note (Signed)
Will finish doxycycline and then stop. Recent blood cultures in hospital were negative.

## 2021-04-21 NOTE — Assessment & Plan Note (Signed)
Have asked daughter to reach out to rheumatology to see if they could decrease to 1 mg prednisone then stop after 1 month to help lower pill burden and they then might be able to reduce or stop lantus as well. It is not clear that she is benefiting from this any longer.

## 2021-04-22 ENCOUNTER — Telehealth: Payer: Self-pay

## 2021-04-22 DIAGNOSIS — G9341 Metabolic encephalopathy: Secondary | ICD-10-CM | POA: Diagnosis not present

## 2021-04-22 DIAGNOSIS — N390 Urinary tract infection, site not specified: Secondary | ICD-10-CM | POA: Diagnosis not present

## 2021-04-22 DIAGNOSIS — U071 COVID-19: Secondary | ICD-10-CM | POA: Diagnosis not present

## 2021-04-22 DIAGNOSIS — A401 Sepsis due to streptococcus, group B: Secondary | ICD-10-CM | POA: Diagnosis not present

## 2021-04-22 DIAGNOSIS — E1151 Type 2 diabetes mellitus with diabetic peripheral angiopathy without gangrene: Secondary | ICD-10-CM | POA: Diagnosis not present

## 2021-04-22 DIAGNOSIS — I110 Hypertensive heart disease with heart failure: Secondary | ICD-10-CM | POA: Diagnosis not present

## 2021-04-22 NOTE — Telephone Encounter (Signed)
See result note.  

## 2021-04-22 NOTE — Telephone Encounter (Signed)
Spoke with patient's daughter [name] and scheduled a Televisit Palliative Consult for 04/24/21 @ 1:30 PM.  Consent obtained; updated Outlook/Netsmart/Team List and Epic.

## 2021-04-24 ENCOUNTER — Other Ambulatory Visit: Payer: Medicare Other | Admitting: Family Medicine

## 2021-04-24 ENCOUNTER — Other Ambulatory Visit: Payer: Self-pay

## 2021-04-24 ENCOUNTER — Telehealth: Payer: Self-pay | Admitting: Internal Medicine

## 2021-04-24 DIAGNOSIS — Z515 Encounter for palliative care: Secondary | ICD-10-CM

## 2021-04-24 DIAGNOSIS — G9341 Metabolic encephalopathy: Secondary | ICD-10-CM | POA: Diagnosis not present

## 2021-04-24 DIAGNOSIS — N390 Urinary tract infection, site not specified: Secondary | ICD-10-CM | POA: Diagnosis not present

## 2021-04-24 DIAGNOSIS — A401 Sepsis due to streptococcus, group B: Secondary | ICD-10-CM | POA: Diagnosis not present

## 2021-04-24 DIAGNOSIS — R1310 Dysphagia, unspecified: Secondary | ICD-10-CM

## 2021-04-24 DIAGNOSIS — N3000 Acute cystitis without hematuria: Secondary | ICD-10-CM

## 2021-04-24 DIAGNOSIS — E43 Unspecified severe protein-calorie malnutrition: Secondary | ICD-10-CM

## 2021-04-24 DIAGNOSIS — G8929 Other chronic pain: Secondary | ICD-10-CM

## 2021-04-24 DIAGNOSIS — E1151 Type 2 diabetes mellitus with diabetic peripheral angiopathy without gangrene: Secondary | ICD-10-CM | POA: Diagnosis not present

## 2021-04-24 DIAGNOSIS — K5900 Constipation, unspecified: Secondary | ICD-10-CM

## 2021-04-24 DIAGNOSIS — I110 Hypertensive heart disease with heart failure: Secondary | ICD-10-CM | POA: Diagnosis not present

## 2021-04-24 DIAGNOSIS — U071 COVID-19: Secondary | ICD-10-CM | POA: Diagnosis not present

## 2021-04-24 NOTE — Telephone Encounter (Signed)
Kayla Thompson from [facility] called with plan of care:  OT 1 x week for 2 weeks - training in heart failure education, ulcer  prevention, transfers, exercise   Phone # [phone]

## 2021-04-24 NOTE — Progress Notes (Signed)
[facility] Consult Note Telephone: [phone]  Fax: [phone]   Date of encounter: 04/24/21 1:42 PM PATIENT NAME: Kayla [name] [name] Thompson [id number]   [phone] (home) [phone] (work) DOB: [birth year] MRN: [id number] PRIMARY CARE PROVIDER:    [name], MD,  [place] [address] [phone]  REFERRING PROVIDER:   [name], MD [address] [place],  [place] [address] [phone]  RESPONSIBLE PARTY:    Contact Information     Name Relation Home Work [place] Daughter   [phone]   [name] Daughter [phone]     alexander,Kayla Thompson Granddaughter   [phone]       Due to the COVID-19 crisis, this visit was done via telemedicine from my office and it was initiated and consent by this patient and or family.  I connected with  Kayla Thompson OR PROXY on 04/26/21 by a telephone as pt was unable to do video enabled telemedicine application and verified that I am speaking with the correct person using two identifiers.   I discussed the limitations of evaluation and management by telemedicine. The patient's daughter Kayla Thompson expressed understanding and agreed to proceed. Palliative Care was asked to follow this patient by consultation request of  Kayla Thompson, * to address advance care planning and complex medical decision making. This is the initial visit.          ASSESSMENT, SYMPTOM MANAGEMENT AND PLAN / RECOMMENDATIONS:  Palliative Care Encounter Attempted to discuss goals of care and daughter adamant that pt is not ready for Hospice.  Has DNR, discussed issues that are covered with MOST but daughter not ready to address. Will take over pain management for patient.  Dysphagia/Severe Protein Calorie Malnutrition Advised checking after eating to make sure pt not pocketing food, increasing risk for aspiration  pneumonia. Continue Protein supplements TID and medications either liquid or crushed with puree to improve swallow/add calories. Advised upright for all meals for 30 minutes after, use of chin tuck to aid in swallow.  Recurrent UTI Has had multiple infections, now with MDRO, on Methenamine and Nitrofurantoin prophylactically.  Chronic pain Continue current Percocet but did discuss if difficulty with getting pt to take pills she may benefit from transition to Fentanyl transdermal for more continuous release.   Currently daughter does not want to try Fentanyl. Has Narcan on hand and lockbox. Will monitor and issue pain contract when assuming care for controlled meds.  Constipation If unable to get pt to take Miralax or Linzess daily, can switch to Lactulose which comes in smaller volume and give BID.   Follow up Palliative Care Visit: Palliative care will continue to follow for complex medical decision making, advance care planning, and clarification of goals. Return 4 weeks or prn.    This visit was coded based on medical decision making (MDM).  PPS: 40%  HOSPICE ELIGIBILITY/DIAGNOSIS: TBD  Chief Complaint:  [facility] received a referral to follow up with patient for chronic disease management, advance directive and defining/refining goals of care.   HISTORY OF PRESENT ILLNESS:  Kayla Thompson is a 86 y.o. year old female with COPD, DM, DJD, RA and multiple falls. Has had recent trouble with recurrent UTI, development of sepsis with recent Covid 19 infection January 5.2023. She is on prophylactic methenamine and nitrofurantoin for prevention of urosepsis since pt is usually asymptomatic until very ill. Subsequently she had a 2nd hospitalization for  fecal impaction and colitis, is now taking Linzess, has been pocketing food, having more difficulty getting around with trouble swallowing and some nausea. Eating waxes and wanes. She has lost 50 lbs over the last  year.  October 101 lbs, now 87-91 lbs.  Don't have an etiology for weight loss except failure to thrive. Doing Ensure.   Has 24 hr care as her daughter Kayla Thompson is with her for 3 months at a time, then home for 2 weeks and also has paid caregiver. Caregiver 8:30-4:30 Mon to Friday and some weekend coverage.  Patient is at times refusing to shower and take medications. Daughter wants to de-escalate some medications and offer in liquid form if possible.Kayla Thompson has already started taking out some supplements including Vitamin B12 which was high, Baclofen, 1 Caltrate and tart cherry.   Hates going to ER/hospital.  Kayla Thompson states multiple times "She is not ready for hospice because of her will".     Has 2 torn rotator cuffs. Taking Oxycodone 7.5 /325 mg and every 4-6 hrs along with additional Tylenol.  Seeing pain management at [facility] but having increased difficulty in getting to those appointments and wants to know if Palliative can take over prescribing.  She states she has everything controlled med in locked boxes. Goes monthly, last appt 2 weeks ago. She also wants to know if she can call Palliative to do urine specimen at any sign of UTI or increased confusion given her repetitive urosepsis.  PCP notes indicate possible fungal sepsis for which pt is still being treated with antibiotics. There is some mention of a diagnosis of dementia which is consistent with pt's presentation.  History obtained from review of EMR, interview with daughter and primary caregiver Kayla Thompson.  I reviewed available labs, medications, imaging, studies and related documents from the EMR.  Records reviewed and summarized above.   ROS Unable to obtain from pt, obtained information in HPI above from daughter   Physical Exam: Current and past weights: 89 lbs on 04/20/2021, 106.26 lbs on 01/07/21, 116.6 lbs on 07/30/20   CURRENT PROBLEM LIST:  Patient Active Problem List   Diagnosis Date Noted   Fecal impaction ([facility]) 04/12/2021    Hypokalemia 04/12/2021   Positive blood culture 04/03/2021   Constipation 03/11/2021   Weight loss 03/11/2021   Chronic obstructive pulmonary disease ([place]) 11/20/2020   Other chronic pain    Hypertension associated with diabetes ([place]) 05/07/2020   Hyperlipidemia associated with type 2 diabetes mellitus ([place]) 05/07/2020   Depression 05/07/2020   PAD (peripheral artery disease) ([place]) 02/05/2020   Upper esophageal web 03/27/2019   Iron deficiency anemia 03/27/2019   Candida esophagitis ([place]) 02/14/2019   UTI (urinary tract infection) 01/25/2019   Yeast cystitis 01/25/2019   Encounter for general adult medical examination with abnormal findings 01/09/2019   History of colonic polyps 12/28/2018   Dysphagia 12/28/2018   Diabetic foot ulcer ([place]) 12/05/2018   Rotator cuff arthropathy, left 11/16/2018   Degenerative arthritis of left knee 11/16/2018   Insomnia 09/26/2018   Leukocytosis 10/17/2017   Severe protein-calorie malnutrition ([place]) 10/10/2017   Memory changes 07/29/2017   Granulomatous lung disease ([place]) 06/14/2017   Tracheobronchomalacia 06/14/2017   Dizziness 03/04/2017   Exercise hypoxemia 07/12/2016   Chronic heart failure with preserved ejection fraction ([place]) 12/25/2015   GERD (gastroesophageal reflux disease) 12/25/2015   Epidermal inclusion cyst 09/02/2015   Muscle cramps 03/06/2015   Cough 01/07/2015   Bronchiectasis without acute exacerbation ([place]) 01/07/2015   PMR (polymyalgia rheumatica) ([place])  01/07/2015   Osteoarthritis 01/07/2015   Asthma, chronic 11/12/2014   Anxiety state 11/12/2014   Hyperparathyroidism ([place]) 10/28/2014   Fibromyalgia 09/21/2014   Personal history of ovarian cancer 08/23/2014   Osteoporosis 08/23/2014   DM2 (diabetes mellitus, type 2) ([place]) 08/02/2014   HLD (hyperlipidemia) 08/02/2014   Hypothyroidism 08/02/2014   PAST MEDICAL HISTORY:  Active Ambulatory Problems    Diagnosis Date Noted   DM2 (diabetes mellitus, type 2) ([place])  08/02/2014   HLD (hyperlipidemia) 08/02/2014   Hypothyroidism 08/02/2014   Personal history of ovarian cancer 08/23/2014   Osteoporosis 08/23/2014   Fibromyalgia 09/21/2014   Hyperparathyroidism ([place]) 10/28/2014   Asthma, chronic 11/12/2014   Anxiety state 11/12/2014   Cough 01/07/2015   Bronchiectasis without acute exacerbation ([place]) 01/07/2015   PMR (polymyalgia rheumatica) ([place]) 01/07/2015   Osteoarthritis 01/07/2015   Muscle cramps 03/06/2015   Epidermal inclusion cyst 09/02/2015   Chronic heart failure with preserved ejection fraction ([place]) 12/25/2015   GERD (gastroesophageal reflux disease) 12/25/2015   Exercise hypoxemia 07/12/2016   Dizziness 03/04/2017   Granulomatous lung disease ([place]) 06/14/2017   Tracheobronchomalacia 06/14/2017   Memory changes 07/29/2017   Severe protein-calorie malnutrition ([place]) 10/10/2017   Leukocytosis 10/17/2017   Insomnia 09/26/2018   Rotator cuff arthropathy, left 11/16/2018   Degenerative arthritis of left knee 11/16/2018   Diabetic foot ulcer ([place]) 12/05/2018   History of colonic polyps 12/28/2018   Dysphagia 12/28/2018   Encounter for general adult medical examination with abnormal findings 01/09/2019   UTI (urinary tract infection) 01/25/2019   Yeast cystitis 01/25/2019   Candida esophagitis ([place]) 02/14/2019   Upper esophageal web 03/27/2019   Iron deficiency anemia 03/27/2019   PAD (peripheral artery disease) ([place]) 02/05/2020   Hypertension associated with diabetes ([place]) 05/07/2020   Hyperlipidemia associated with type 2 diabetes mellitus ([place]) 05/07/2020   Depression 05/07/2020   Other chronic pain    Chronic obstructive pulmonary disease ([place]) 11/20/2020   Constipation 03/11/2021   Weight loss 03/11/2021   Positive blood culture 04/03/2021   Fecal impaction ([place]) 04/12/2021   Hypokalemia 04/12/2021   Resolved Ambulatory Problems    Diagnosis Date Noted   Syncope 08/02/2014   Left wrist fracture 08/02/2014   Fall     Faintness    Dyspnea 01/07/2015   Acute bronchitis 02/21/2015   Type 2 diabetes mellitus with hyperglycemia, with long-term current use of insulin ([place]) 03/10/2015   Left elbow pain 04/16/2017   Rib pain on left side 04/16/2017   Community acquired pneumonia of left lower lobe of lung 10/09/2017   Bronchiectasis with (acute) exacerbation ([place]) 03/20/2018   Abnormal barium swallow 12/28/2018   Throat clearing 75/64/3329   Acute metabolic encephalopathy 51/88/4166   Sepsis ([place]) 01/25/2019   Dysuria 05/11/2019   UTI symptoms 05/29/2019   Pre-operative cardiovascular examination 10/18/2019   Lower extremity edema 11/20/2019   Sepsis ([place]) 01/12/2020   Acute pain of both knees 02/20/2020   Sepsis due to urinary tract infection ([place]) 05/07/2020   AMS (altered mental status) 07/07/2020   UTI due to extended-spectrum beta lactamase (ESBL) producing Escherichia coli 07/30/2020   Urinary tract infection without hematuria    Pain due to onychomycosis of toenails of both feet 09/19/1599   Acute metabolic encephalopathy 09/32/3557   COVID-19 03/27/2021   Severe sepsis ([place]) 03/27/2021   Pressure injury of skin 03/27/2021   Sepsis ([facility]la) 04/11/2021   Cystitis    Hypomagnesemia 04/12/2021   Past Medical History:  Diagnosis Date   Anxiety  Arthritis    Asthma    Cataracts, bilateral    COPD (chronic obstructive pulmonary disease) (HCC)    Diabetes mellitus without complication (HCC)    History of chemotherapy    History of fractured pelvis    Hypertension    Ovarian cancer ([place])    Pneumonia    SOCIAL HX:  Social History   Tobacco Use   Smoking status: Never    Passive exposure: Never   Smokeless tobacco: Never  Substance Use Topics   Alcohol use: No    Alcohol/week: 0.0 standard drinks   FAMILY HX:  Family History  Problem Relation Age of Onset   Lung cancer Mother    CAD Father    Diabetes Father    Lung cancer Maternal Aunt    Diabetes Paternal Aunt    Diabetes  Paternal Uncle    Diabetes Paternal Grandmother    Diabetes Paternal Grandfather    Colon cancer Neg Hx    Esophageal cancer Neg Hx    Inflammatory bowel disease Neg Hx    Liver disease Neg Hx    Pancreatic cancer Neg Hx    Rectal cancer Neg Hx    Stomach cancer Neg Hx        Preferred Pharmacy: [facility]:  Allergies  Allergen Reactions   Tape Other (See Comments)    SKIN IS VERY THIN AND DELICATE- will tear like paper!!   Lasix [Furosemide] Other (See Comments)    Confusion- MD stopped this med   Latex Rash   Penicillins Rash    Has patient had a PCN reaction causing immediate rash, facial/tongue/throat swelling, SOB or lightheadedness with hypotension: No Has patient had a PCN reaction causing severe rash involving mucus membranes or skin necrosis: No Has patient had a PCN reaction that required hospitalization: No Has patient had a PCN reaction occurring within the last 10 years: No If all of the above answers are "NO", then may proceed with Cephalosporin use.      PERTINENT MEDICATIONS:  Outpatient Encounter Medications as of 04/24/2021  Medication Sig   acetaminophen (TYLENOL) 650 MG CR tablet Take 1 tablet (650 mg total) by mouth See admin instructions. Take 1,300 mg by mouth every 4-5 hours IN CONJUNCTION WITH one Percocet 7.5/325 tablet- for pain (Patient not taking: Reported on 04/20/2021)   Apoaequorin 10 MG CAPS Take 10 mg by mouth every evening. (Patient not taking: Reported on 04/20/2021)   Ascorbic Acid (VITAMIN C) 1000 MG tablet Take 1,000 mg by mouth in the morning and at bedtime.   baclofen (LIORESAL) 10 MG tablet TAKE 0.5-1 TABLET BY MOUTH TWICE DAILY AS NEEDED FOR MUSCLE SPASMS OR PAIN (Patient taking differently: Take 10 mg by mouth at bedtime.)   BD PEN NEEDLE NANO 2ND GEN 32G X 4 MM MISC USE AS DIRECTED 4 TIMES A DAY   Calcium Carbonate (CALTRATE 600 PO) Take 600 mg by mouth in the morning and at bedtime.   cholecalciferol (VITAMIN D3) 25 MCG (1000 UNIT)  tablet Take 1,000 Units by mouth daily.   Cyanocobalamin (VITAMIN B 12 PO) Take 2,500 mcg by mouth every Thursday.   denosumab (PROLIA) 60 MG/ML SOSY injection Inject 60 mg into the skin every 6 (six) months.   Dextromethorphan-guaiFENesin (MUCINEX DM MAXIMUM STRENGTH) 60-1200 MG TB12 Take 1 tablet by mouth in the morning and at bedtime.   docusate sodium (COLACE) 100 MG capsule Take 1 capsule (100 mg total) by mouth daily.   doxycycline (VIBRAMYCIN) 100 MG capsule Take  1 capsule (100 mg total) by mouth 2 (two) times daily.   DULoxetine (CYMBALTA) 30 MG capsule Take 1 capsule (30 mg total) by mouth daily. (Patient taking differently: Take 30 mg by mouth in the morning.)   DULoxetine (CYMBALTA) 60 MG capsule Take 1 capsule (60 mg total) by mouth daily. (Patient taking differently: Take 60 mg by mouth in the morning.)   estradiol (ESTRACE) 0.1 MG/GM vaginal cream Place 1 Applicatorful vaginally See admin instructions. Takes 2 times a week   feeding supplement (ENSURE ENLIVE / ENSURE PLUS) LIQD Take 237 mLs by mouth 2 (two) times daily between meals. (Patient taking differently: Take 237 mLs by mouth 3 (three) times daily with meals.)   fluconazole (DIFLUCAN) 100 MG tablet Take 1 tablet (100 mg total) by mouth daily for 12 days.   gabapentin (NEURONTIN) 100 MG capsule Take 100 mg by mouth at bedtime.   glucose blood (FREESTYLE LITE) test strip USE TO TEST BLOOD SUGAR 3 TIMES DAILY.   insulin glargine (LANTUS) 100 UNIT/ML Solostar Pen Inject 5 Units into the skin in the morning.   ipratropium-albuterol (DUONEB) 0.5-2.5 (3) MG/3ML SOLN Inhale 3 mLs into the lungs every 4 (four) hours as needed. (Patient taking differently: Inhale 3 mLs into the lungs 3 (three) times daily.)   Lancets (FREESTYLE) lancets Use to test blood sugar 3 times daily. Dx: E11.65   levothyroxine (SYNTHROID) 50 MCG tablet TAKE 1 TABLET BY MOUTH EVERY DAY BEFORE BREAKFAST (Patient taking differently: Take 50 mcg by mouth daily before  breakfast.)   linaclotide (LINZESS) 72 MCG capsule Take 1 capsule (72 mcg total) by mouth daily before breakfast.   methenamine (MANDELAMINE) 1 g tablet Take 1,000 mg by mouth 2 (two) times daily.   mirtazapine (REMERON) 15 MG tablet TAKE 1 TABLET BY MOUTH EVERYDAY AT BEDTIME (Patient taking differently: Take 15 mg by mouth at bedtime.)   Misc Natural Products (TART CHERRY ADVANCED PO) Take 1,000 mg by mouth every evening.   montelukast (SINGULAIR) 10 MG tablet TAKE 1 TABLET BY MOUTH EVERY DAY (Patient taking differently: Take 10 mg by mouth at bedtime.)   naloxone (NARCAN) nasal spray 4 mg/0.1 mL Place 1 spray into the nose once as needed (overdose).   nitrofurantoin, macrocrystal-monohydrate, (MACROBID) 100 MG capsule Take 100 mg by mouth in the morning.   omeprazole (PRILOSEC) 20 MG capsule TAKE 1 CAPSULE TWICE A DAY BEFORE A MEAL (Patient taking differently: Take 20 mg by mouth 2 (two) times daily before a meal.)   oxyCODONE-acetaminophen (PERCOCET) 7.5-325 MG tablet Take 1 tablet by mouth See admin instructions. Take 1 tablet by mouth every 4-5 hours (IN CONJUNCTION WITH 1,300 mg of Tylenol). Max 5 tab/day.   polyethylene glycol (MIRALAX / GLYCOLAX) 17 g packet Take 17 g by mouth 2 (two) times daily. Decrease to daily or every other day pending on bowel movement frequency/consistency   predniSONE (DELTASONE) 1 MG tablet Take 1 tablet (1 mg total) by mouth in the morning and at bedtime. (Patient taking differently: Take 2 mg by mouth daily at 6 (six) AM.)   rosuvastatin (CRESTOR) 10 MG tablet Take 1 tablet (10 mg total) by mouth every evening.   TRELEGY ELLIPTA 100-62.5-25 MCG/INH AEPB INHALE 1 PUFF INTO THE LUNGS DAILY. RINSE MOUTH (Patient taking differently: Inhale 1 puff into the lungs in the morning.)   TURMERIC PO Take 1,000 mg by mouth in the morning.   No facility-administered encounter medications on file as of 04/24/2021.      ----------------------------------------------------------------------------------------------------------------------------------------------------------------------------------------------------------------  Advance Care Planning/Goals of Care: Goals include symptom management. Health care surrogate did not want to discuss much, stating her mother was "strong willed, wasn't ready for Hospice.  Our advance care planning conversation included a discussion about:    The value and importance of advance care planning regardless of whether patient/family goals are for full treatment or comfort care. Experiences with loved ones who have been seriously ill or have died  Identification  of a healthcare agent Reviewed general MOST information and daughter is not ready for creation of an advance directive document .  CODE STATUS: DNR status per signed document 05/31/2017 (not sure if daughter would be in agreement)    Thank you for the opportunity to participate in the care of Ms. Toto.  The palliative care team will continue to follow. Please call our office at [phone] if we can be of additional assistance.   Marijo Conception, FNP-C  COVID-19 PATIENT SCREENING TOOL Asked and negative response unless otherwise noted:  Have you had symptoms of covid, tested positive or been in contact with someone with symptoms/positive test in the past 5-10 days? Diagnosed with SARS-CoV-2 03/27/2021.

## 2021-04-24 NOTE — Telephone Encounter (Signed)
Okay for verbals °

## 2021-04-26 ENCOUNTER — Encounter: Payer: Self-pay | Admitting: Family Medicine

## 2021-04-26 DIAGNOSIS — Z515 Encounter for palliative care: Secondary | ICD-10-CM | POA: Insufficient documentation

## 2021-04-28 ENCOUNTER — Other Ambulatory Visit: Payer: Self-pay | Admitting: Gastroenterology

## 2021-04-28 DIAGNOSIS — G9341 Metabolic encephalopathy: Secondary | ICD-10-CM | POA: Diagnosis not present

## 2021-04-28 DIAGNOSIS — I110 Hypertensive heart disease with heart failure: Secondary | ICD-10-CM | POA: Diagnosis not present

## 2021-04-28 DIAGNOSIS — A401 Sepsis due to streptococcus, group B: Secondary | ICD-10-CM | POA: Diagnosis not present

## 2021-04-28 DIAGNOSIS — E1151 Type 2 diabetes mellitus with diabetic peripheral angiopathy without gangrene: Secondary | ICD-10-CM | POA: Diagnosis not present

## 2021-04-28 DIAGNOSIS — U071 COVID-19: Secondary | ICD-10-CM | POA: Diagnosis not present

## 2021-04-28 DIAGNOSIS — N390 Urinary tract infection, site not specified: Secondary | ICD-10-CM | POA: Diagnosis not present

## 2021-04-28 NOTE — Telephone Encounter (Signed)
Called Don. LDVM at [phone] with verbal orders. Office number was provided.

## 2021-04-29 DIAGNOSIS — A401 Sepsis due to streptococcus, group B: Secondary | ICD-10-CM | POA: Diagnosis not present

## 2021-04-29 DIAGNOSIS — E1151 Type 2 diabetes mellitus with diabetic peripheral angiopathy without gangrene: Secondary | ICD-10-CM | POA: Diagnosis not present

## 2021-04-29 DIAGNOSIS — N390 Urinary tract infection, site not specified: Secondary | ICD-10-CM | POA: Diagnosis not present

## 2021-04-29 DIAGNOSIS — U071 COVID-19: Secondary | ICD-10-CM | POA: Diagnosis not present

## 2021-04-29 DIAGNOSIS — G9341 Metabolic encephalopathy: Secondary | ICD-10-CM | POA: Diagnosis not present

## 2021-04-29 DIAGNOSIS — I110 Hypertensive heart disease with heart failure: Secondary | ICD-10-CM | POA: Diagnosis not present

## 2021-05-01 DIAGNOSIS — E1151 Type 2 diabetes mellitus with diabetic peripheral angiopathy without gangrene: Secondary | ICD-10-CM | POA: Diagnosis not present

## 2021-05-01 DIAGNOSIS — U071 COVID-19: Secondary | ICD-10-CM | POA: Diagnosis not present

## 2021-05-01 DIAGNOSIS — N390 Urinary tract infection, site not specified: Secondary | ICD-10-CM | POA: Diagnosis not present

## 2021-05-01 DIAGNOSIS — A401 Sepsis due to streptococcus, group B: Secondary | ICD-10-CM | POA: Diagnosis not present

## 2021-05-01 DIAGNOSIS — G9341 Metabolic encephalopathy: Secondary | ICD-10-CM | POA: Diagnosis not present

## 2021-05-01 DIAGNOSIS — I110 Hypertensive heart disease with heart failure: Secondary | ICD-10-CM | POA: Diagnosis not present

## 2021-05-04 ENCOUNTER — Ambulatory Visit (INDEPENDENT_AMBULATORY_CARE_PROVIDER_SITE_OTHER): Payer: Medicare Other | Admitting: Podiatry

## 2021-05-04 ENCOUNTER — Other Ambulatory Visit: Payer: Self-pay

## 2021-05-04 ENCOUNTER — Encounter: Payer: Self-pay | Admitting: Podiatry

## 2021-05-04 DIAGNOSIS — B351 Tinea unguium: Secondary | ICD-10-CM | POA: Diagnosis not present

## 2021-05-04 DIAGNOSIS — M79675 Pain in left toe(s): Secondary | ICD-10-CM

## 2021-05-04 DIAGNOSIS — M79674 Pain in right toe(s): Secondary | ICD-10-CM

## 2021-05-04 DIAGNOSIS — M2041 Other hammer toe(s) (acquired), right foot: Secondary | ICD-10-CM | POA: Diagnosis not present

## 2021-05-04 DIAGNOSIS — E1149 Type 2 diabetes mellitus with other diabetic neurological complication: Secondary | ICD-10-CM

## 2021-05-04 NOTE — Telephone Encounter (Signed)
Prolia VOB initiated for 2023. °

## 2021-05-04 NOTE — Progress Notes (Signed)
This patient returns to my office for at risk foot care.  This patient requires this care by a professional since this patient will be at risk due to having type 2 DM, and PAD.  This patient is unable to cut nails herself since the patient cannot reach her nails.These nails are painful walking and wearing shoes.  This patient presents for at risk foot care today.  General Appearance  Alert, conversant and in no acute stress.  Vascular  Dorsalis pedis and posterior tibial  pulses are  weakly palpable  bilaterally.  Capillary return is within normal limits  bilaterally. Cold feet bilaterally. Purplish feet noted.  Neurologic  Senn-Weinstein monofilament wire test within normal limits  bilaterally. Muscle power within normal limits bilaterally.  Nails Thick disfigured discolored nails with subungual debris  from hallux to fifth toes bilaterally. No evidence of bacterial infection or drainage bilaterally.  Subungual hematoma right hallux.  Orthopedic  No limitations of motion  feet .  No crepitus or effusions noted.  Mild  HAV with hammer toes 2-5  B/L.  Rigid ht second right.  Skin  normotropic skin with no porokeratosis noted bilaterally.  No signs of infections or ulcers noted.     Onychomycosis  Pain in right toes  Pain in left toes  Consent was obtained for treatment procedures.   Mechanical debridement of nails 1-5  bilaterally performed with a nail nipper.  Filed with dremel without incident.   Return office visit    3 months                  Told patient to return for periodic foot care and evaluation due to potential at risk complications.   Gardiner Barefoot DPM

## 2021-05-05 ENCOUNTER — Other Ambulatory Visit: Payer: Self-pay | Admitting: Internal Medicine

## 2021-05-06 DIAGNOSIS — I110 Hypertensive heart disease with heart failure: Secondary | ICD-10-CM | POA: Diagnosis not present

## 2021-05-06 DIAGNOSIS — U071 COVID-19: Secondary | ICD-10-CM | POA: Diagnosis not present

## 2021-05-06 DIAGNOSIS — G9341 Metabolic encephalopathy: Secondary | ICD-10-CM | POA: Diagnosis not present

## 2021-05-06 DIAGNOSIS — N390 Urinary tract infection, site not specified: Secondary | ICD-10-CM | POA: Diagnosis not present

## 2021-05-06 DIAGNOSIS — E1151 Type 2 diabetes mellitus with diabetic peripheral angiopathy without gangrene: Secondary | ICD-10-CM | POA: Diagnosis not present

## 2021-05-06 DIAGNOSIS — A401 Sepsis due to streptococcus, group B: Secondary | ICD-10-CM | POA: Diagnosis not present

## 2021-05-08 DIAGNOSIS — K5641 Fecal impaction: Secondary | ICD-10-CM | POA: Diagnosis not present

## 2021-05-08 DIAGNOSIS — I110 Hypertensive heart disease with heart failure: Secondary | ICD-10-CM | POA: Diagnosis not present

## 2021-05-08 DIAGNOSIS — E785 Hyperlipidemia, unspecified: Secondary | ICD-10-CM | POA: Diagnosis not present

## 2021-05-08 DIAGNOSIS — G8929 Other chronic pain: Secondary | ICD-10-CM | POA: Diagnosis not present

## 2021-05-08 DIAGNOSIS — M19011 Primary osteoarthritis, right shoulder: Secondary | ICD-10-CM | POA: Diagnosis not present

## 2021-05-08 DIAGNOSIS — F32A Depression, unspecified: Secondary | ICD-10-CM | POA: Diagnosis not present

## 2021-05-08 DIAGNOSIS — F419 Anxiety disorder, unspecified: Secondary | ICD-10-CM | POA: Diagnosis not present

## 2021-05-08 DIAGNOSIS — Z1612 Extended spectrum beta lactamase (ESBL) resistance: Secondary | ICD-10-CM | POA: Diagnosis not present

## 2021-05-08 DIAGNOSIS — J449 Chronic obstructive pulmonary disease, unspecified: Secondary | ICD-10-CM | POA: Diagnosis not present

## 2021-05-08 DIAGNOSIS — K219 Gastro-esophageal reflux disease without esophagitis: Secondary | ICD-10-CM | POA: Diagnosis not present

## 2021-05-08 DIAGNOSIS — B379 Candidiasis, unspecified: Secondary | ICD-10-CM | POA: Diagnosis not present

## 2021-05-08 DIAGNOSIS — D539 Nutritional anemia, unspecified: Secondary | ICD-10-CM | POA: Diagnosis not present

## 2021-05-08 DIAGNOSIS — G319 Degenerative disease of nervous system, unspecified: Secondary | ICD-10-CM | POA: Diagnosis not present

## 2021-05-08 DIAGNOSIS — M797 Fibromyalgia: Secondary | ICD-10-CM | POA: Diagnosis not present

## 2021-05-08 DIAGNOSIS — M353 Polymyalgia rheumatica: Secondary | ICD-10-CM | POA: Diagnosis not present

## 2021-05-08 DIAGNOSIS — B3741 Candidal cystitis and urethritis: Secondary | ICD-10-CM | POA: Diagnosis not present

## 2021-05-08 DIAGNOSIS — I5032 Chronic diastolic (congestive) heart failure: Secondary | ICD-10-CM | POA: Diagnosis not present

## 2021-05-08 DIAGNOSIS — M810 Age-related osteoporosis without current pathological fracture: Secondary | ICD-10-CM | POA: Diagnosis not present

## 2021-05-08 DIAGNOSIS — M19012 Primary osteoarthritis, left shoulder: Secondary | ICD-10-CM | POA: Diagnosis not present

## 2021-05-08 DIAGNOSIS — A401 Sepsis due to streptococcus, group B: Secondary | ICD-10-CM | POA: Diagnosis not present

## 2021-05-08 DIAGNOSIS — G40909 Epilepsy, unspecified, not intractable, without status epilepticus: Secondary | ICD-10-CM | POA: Diagnosis not present

## 2021-05-08 DIAGNOSIS — I700 Atherosclerosis of aorta: Secondary | ICD-10-CM | POA: Diagnosis not present

## 2021-05-08 DIAGNOSIS — E039 Hypothyroidism, unspecified: Secondary | ICD-10-CM | POA: Diagnosis not present

## 2021-05-08 DIAGNOSIS — A427 Actinomycotic sepsis: Secondary | ICD-10-CM | POA: Diagnosis not present

## 2021-05-08 DIAGNOSIS — E1151 Type 2 diabetes mellitus with diabetic peripheral angiopathy without gangrene: Secondary | ICD-10-CM | POA: Diagnosis not present

## 2021-05-09 DIAGNOSIS — Z1612 Extended spectrum beta lactamase (ESBL) resistance: Secondary | ICD-10-CM | POA: Diagnosis not present

## 2021-05-09 DIAGNOSIS — B379 Candidiasis, unspecified: Secondary | ICD-10-CM | POA: Diagnosis not present

## 2021-05-09 DIAGNOSIS — B3741 Candidal cystitis and urethritis: Secondary | ICD-10-CM | POA: Diagnosis not present

## 2021-05-09 DIAGNOSIS — A427 Actinomycotic sepsis: Secondary | ICD-10-CM | POA: Diagnosis not present

## 2021-05-09 DIAGNOSIS — K5641 Fecal impaction: Secondary | ICD-10-CM | POA: Diagnosis not present

## 2021-05-09 DIAGNOSIS — A401 Sepsis due to streptococcus, group B: Secondary | ICD-10-CM | POA: Diagnosis not present

## 2021-05-13 ENCOUNTER — Telehealth: Payer: Self-pay | Admitting: Internal Medicine

## 2021-05-13 ENCOUNTER — Other Ambulatory Visit: Payer: Medicare Other | Admitting: Family Medicine

## 2021-05-13 ENCOUNTER — Encounter: Payer: Self-pay | Admitting: Family Medicine

## 2021-05-13 VITALS — BP 92/50 | HR 77 | Temp 97.4°F | Resp 18 | Wt 87.0 lb

## 2021-05-13 DIAGNOSIS — E43 Unspecified severe protein-calorie malnutrition: Secondary | ICD-10-CM | POA: Diagnosis not present

## 2021-05-13 DIAGNOSIS — Z515 Encounter for palliative care: Secondary | ICD-10-CM

## 2021-05-13 NOTE — Telephone Encounter (Signed)
All orders have ben signed and faxed as we have received them. Confirmation faxes have been received.

## 2021-05-13 NOTE — Progress Notes (Signed)
her   Kayla Thompson: [phone]  Fax: [phone]    Date of encounter: 05/13/21 8:48 PM PATIENT NAME: Kayla Thompson   [phone] (home) [phone] (work) DOB: 12-04-1935 MRN: [id number] PRIMARY CARE PROVIDER:    [name], MD,  [place] [address] [phone]  REFERRING PROVIDER:   [name], MD [address] [place],  [place] [address] [phone]  RESPONSIBLE PARTY:    Contact Information     Name Relation Home Work Kayla Thompson Daughter   [phone]   [place] Kayla Thompson Daughter [phone]     Kayla Thompson Granddaughter   [phone]        I met face to face with patient and family in her home. Palliative Care was asked to follow this patient by consultation request of  Kayla Thompson, * to address advance care planning and complex medical decision making. This is a follow up visit.                                   ASSESSMENT, SYMPTOM MANAGEMENT AND PLAN / RECOMMENDATIONS:   Palliative Care Encounter DNR, need to continue to address goals of care and MOST as pt/daughter willing. Educate on disease process and average course of disease. Will assume responsibility for pain management prescribing when next due provided notification of Dr Kayla Thompson at [facility] so no duplicate prescribing.   Protein Calorie malnutrition/Dysphagia Agree with continued use of protein supplements 2-3 times per day. Discussed chin tuck method to improve swallow. Small, frequent meals Alternate sips/bites   Advance Care Planning/Goals of Care: Goals include to maximize quality of life and symptom management. Patient has not wanted to discuss advance Care planning. Our advance care planning conversation included a discussion about:    The value and importance of advance care planning including use  of MOST to help direct care in desired setting. Exploration of goals of care in the event of a sudden injury or illness -DNR in place Identification of a healthcare agent-daughter Kayla Thompson Reviewed briefly MOST form. Decision not to resuscitate or to de-escalate disease focused treatments due to poor prognosis. CODE STATUS: DNR   Follow up Palliative Care Visit: Palliative care will continue to follow for complex medical decision making, advance care planning, and clarification of goals. Return 4 weeks or prn.   This visit was coded based on medical decision making (MDM).  PPS: 30%  HOSPICE ELIGIBILITY/DIAGNOSIS: TBD  Chief Complaint:  [facility] received a referral to follow up with patient for chronic disease management, advance directive and defining/refining goals of care in patient with weight loss and difficulty swallowing.   HISTORY OF PRESENT ILLNESS:  Kayla Thompson is a 86 y.o. year old female with chronic pain issues related to torn rotator cuffs/fused vertebrae/hx of broken pelvis and hip.  Pt has DM, COPD with bronchiectasis, CHF, hypothyroidism, chronic UTI, colitis and neuropathy.  Per daughter Kayla Thompson it is much harder now for patient to get out and she goes to [facility] for pain management.  Kayla Thompson lives in [place] but has ability to work remotely and spends 3 months at a time living with pt in her home here, will go home for a week or two then returns.  Pt has lost from 130 lbs to 87 lbs per daughter who supplements diet  with 2-3 Ensure daily and 3+ snacks as well as meals.  Pt likes Belvita cookies.  She has had issues with constipation, currently improved with Linzess 72 mcg daily, Miralax and Colace daily.  Has had no recent falls. Sleeps more and has issues with aspiration per MBS.  Per daughter pt has SOB, does have a percussion vest but does not like to use it.  Last HGB A1c was 6.5% on 03/27/2021 with pt taking 5 units Lantus  daily. She is on Mirtazapine for sleep and appetite stimulation.  Kayla Thompson keeps controlled meds in lockbox and pt can have up to 5 pills per day of OxyCodone 7.5/325 mg.  She is on prophylactic Methenamine and Nitrofurantoin for UTI prevention.  History obtained from review of EMR, interview with daughter Kayla Thompson and/or Kayla Thompson.  I reviewed available labs, medications, imaging, studies and related documents from the EMR.  Records reviewed and summarized above.   ROS General: NAD EYES: denies vision changes ENMT: endorses dysphagia and cough Cardiovascular: denies chest pain, endorses DOE Pulmonary: endorses intermittent cough with increased SOB Abdomen: endorses fair appetite and intermittent constipation, endorses continence of bowel GU: denies dysuria, endorses continence of urine MSK:  endorses increased weakness/decreased endurance, no falls reported Skin: denies rashes or wounds Neurological: endorses chronic widespread pain, denies insomnia Psych: Endorses depressed mood Heme/lymph/immuno: denies bruises, abnormal bleeding  Physical Exam: Current and past weights: 87 lbs as of 05/13/21, weight 02/26/21 97 lbs, weight 01/07/21 106.26 lbs Constitutional: NAD, chronically ill appearing General: frail appearing, thin EYES: anicteric sclera, lids intact, no discharge  ENMT: intact hearing, oral mucous membranes moist, dentition intact CV: S1S2, RRR, no LE edema Pulmonary: CTAB diminished in bases, no increased work of breathing Abdomen:  normo-active BS + 4 quadrants, soft and non tender, no ascites GU: deferred MSK: noted sarcopenia of all 4 extremities, moves all extremities but with limited ROM of shoulders (worse on right), ambulatory Skin: warm and dry, no rashes or wounds on visible skin Neuro:  noted generalized weakness and some confusion Psych: non-anxious affect, A and O x 2 Hem/lymph/immuno: no widespread bruising   Thank you for the opportunity to participate in the  care of Kayla Thompson.  The palliative care team will continue to follow. Please call our office at [phone] if we can be of additional assistance.   Kayla Conception, FNP -C  COVID-19 PATIENT SCREENING TOOL Asked and negative response unless otherwise noted:   Have you had symptoms of covid, tested positive or been in contact with someone with symptoms/positive test in the past 5-10 days?  No

## 2021-05-13 NOTE — Telephone Encounter (Signed)
Kayla Thompson w/ [facility] checking status of 127 resumption of care order faxed 04-24-2021 "and multiple times after"  Caller states order is almost 1 month out of medicare compliance  Caller requesting a c/b

## 2021-05-14 ENCOUNTER — Other Ambulatory Visit: Payer: Self-pay

## 2021-05-14 DIAGNOSIS — M79643 Pain in unspecified hand: Secondary | ICD-10-CM | POA: Diagnosis not present

## 2021-05-14 DIAGNOSIS — M25519 Pain in unspecified shoulder: Secondary | ICD-10-CM | POA: Diagnosis not present

## 2021-05-14 DIAGNOSIS — R700 Elevated erythrocyte sedimentation rate: Secondary | ICD-10-CM | POA: Diagnosis not present

## 2021-05-14 DIAGNOSIS — M797 Fibromyalgia: Secondary | ICD-10-CM | POA: Diagnosis not present

## 2021-05-14 DIAGNOSIS — M1990 Unspecified osteoarthritis, unspecified site: Secondary | ICD-10-CM | POA: Diagnosis not present

## 2021-05-14 DIAGNOSIS — M2550 Pain in unspecified joint: Secondary | ICD-10-CM | POA: Diagnosis not present

## 2021-05-14 DIAGNOSIS — Z8739 Personal history of other diseases of the musculoskeletal system and connective tissue: Secondary | ICD-10-CM | POA: Diagnosis not present

## 2021-05-14 DIAGNOSIS — M810 Age-related osteoporosis without current pathological fracture: Secondary | ICD-10-CM | POA: Diagnosis not present

## 2021-05-14 DIAGNOSIS — Z7952 Long term (current) use of systemic steroids: Secondary | ICD-10-CM | POA: Diagnosis not present

## 2021-05-19 NOTE — Telephone Encounter (Signed)
Pt ready for scheduling on or after 05/04/21  Out-of-pocket cost due at time of visit: $0  Primary: Medicare [place] co-insurance: 20% (approximately $276) Admin fee co-insurance: 20% (approximately $25)  Secondary: AARP Medicare Supp [place] co-insurance: Covers Medicare Part B co-insurance and deductible.  Admin fee co-insurance: Covers Medicare Part B co-insurance and deductible.   Deductible: covered by secondary  Prior Auth: not required PA# Valid:   ** This summary of benefits is an estimation of the patient's out-of-pocket cost. Exact cost may vary based on individual plan coverage.

## 2021-05-29 DIAGNOSIS — A427 Actinomycotic sepsis: Secondary | ICD-10-CM | POA: Diagnosis not present

## 2021-05-29 DIAGNOSIS — B379 Candidiasis, unspecified: Secondary | ICD-10-CM | POA: Diagnosis not present

## 2021-05-29 DIAGNOSIS — K5641 Fecal impaction: Secondary | ICD-10-CM | POA: Diagnosis not present

## 2021-05-29 DIAGNOSIS — A401 Sepsis due to streptococcus, group B: Secondary | ICD-10-CM | POA: Diagnosis not present

## 2021-05-29 DIAGNOSIS — B3741 Candidal cystitis and urethritis: Secondary | ICD-10-CM | POA: Diagnosis not present

## 2021-05-29 DIAGNOSIS — Z1612 Extended spectrum beta lactamase (ESBL) resistance: Secondary | ICD-10-CM | POA: Diagnosis not present

## 2021-06-01 DIAGNOSIS — A427 Actinomycotic sepsis: Secondary | ICD-10-CM | POA: Diagnosis not present

## 2021-06-01 DIAGNOSIS — Z1612 Extended spectrum beta lactamase (ESBL) resistance: Secondary | ICD-10-CM | POA: Diagnosis not present

## 2021-06-01 DIAGNOSIS — B379 Candidiasis, unspecified: Secondary | ICD-10-CM | POA: Diagnosis not present

## 2021-06-01 DIAGNOSIS — B3741 Candidal cystitis and urethritis: Secondary | ICD-10-CM | POA: Diagnosis not present

## 2021-06-01 DIAGNOSIS — A401 Sepsis due to streptococcus, group B: Secondary | ICD-10-CM | POA: Diagnosis not present

## 2021-06-01 DIAGNOSIS — K5641 Fecal impaction: Secondary | ICD-10-CM | POA: Diagnosis not present

## 2021-06-04 ENCOUNTER — Other Ambulatory Visit: Payer: Self-pay | Admitting: Internal Medicine

## 2021-06-05 DIAGNOSIS — B379 Candidiasis, unspecified: Secondary | ICD-10-CM | POA: Diagnosis not present

## 2021-06-05 DIAGNOSIS — Z1612 Extended spectrum beta lactamase (ESBL) resistance: Secondary | ICD-10-CM | POA: Diagnosis not present

## 2021-06-05 DIAGNOSIS — K5641 Fecal impaction: Secondary | ICD-10-CM | POA: Diagnosis not present

## 2021-06-05 DIAGNOSIS — A401 Sepsis due to streptococcus, group B: Secondary | ICD-10-CM | POA: Diagnosis not present

## 2021-06-05 DIAGNOSIS — A427 Actinomycotic sepsis: Secondary | ICD-10-CM | POA: Diagnosis not present

## 2021-06-05 DIAGNOSIS — B3741 Candidal cystitis and urethritis: Secondary | ICD-10-CM | POA: Diagnosis not present

## 2021-06-07 DIAGNOSIS — B3741 Candidal cystitis and urethritis: Secondary | ICD-10-CM | POA: Diagnosis not present

## 2021-06-07 DIAGNOSIS — M797 Fibromyalgia: Secondary | ICD-10-CM | POA: Diagnosis not present

## 2021-06-07 DIAGNOSIS — I110 Hypertensive heart disease with heart failure: Secondary | ICD-10-CM | POA: Diagnosis not present

## 2021-06-07 DIAGNOSIS — G319 Degenerative disease of nervous system, unspecified: Secondary | ICD-10-CM | POA: Diagnosis not present

## 2021-06-07 DIAGNOSIS — E1151 Type 2 diabetes mellitus with diabetic peripheral angiopathy without gangrene: Secondary | ICD-10-CM | POA: Diagnosis not present

## 2021-06-07 DIAGNOSIS — A401 Sepsis due to streptococcus, group B: Secondary | ICD-10-CM | POA: Diagnosis not present

## 2021-06-07 DIAGNOSIS — B379 Candidiasis, unspecified: Secondary | ICD-10-CM | POA: Diagnosis not present

## 2021-06-07 DIAGNOSIS — G40909 Epilepsy, unspecified, not intractable, without status epilepticus: Secondary | ICD-10-CM | POA: Diagnosis not present

## 2021-06-07 DIAGNOSIS — M19011 Primary osteoarthritis, right shoulder: Secondary | ICD-10-CM | POA: Diagnosis not present

## 2021-06-07 DIAGNOSIS — I700 Atherosclerosis of aorta: Secondary | ICD-10-CM | POA: Diagnosis not present

## 2021-06-07 DIAGNOSIS — D539 Nutritional anemia, unspecified: Secondary | ICD-10-CM | POA: Diagnosis not present

## 2021-06-07 DIAGNOSIS — Z1612 Extended spectrum beta lactamase (ESBL) resistance: Secondary | ICD-10-CM | POA: Diagnosis not present

## 2021-06-07 DIAGNOSIS — I5032 Chronic diastolic (congestive) heart failure: Secondary | ICD-10-CM | POA: Diagnosis not present

## 2021-06-07 DIAGNOSIS — E039 Hypothyroidism, unspecified: Secondary | ICD-10-CM | POA: Diagnosis not present

## 2021-06-07 DIAGNOSIS — M810 Age-related osteoporosis without current pathological fracture: Secondary | ICD-10-CM | POA: Diagnosis not present

## 2021-06-07 DIAGNOSIS — A427 Actinomycotic sepsis: Secondary | ICD-10-CM | POA: Diagnosis not present

## 2021-06-07 DIAGNOSIS — J449 Chronic obstructive pulmonary disease, unspecified: Secondary | ICD-10-CM | POA: Diagnosis not present

## 2021-06-07 DIAGNOSIS — G8929 Other chronic pain: Secondary | ICD-10-CM | POA: Diagnosis not present

## 2021-06-07 DIAGNOSIS — E785 Hyperlipidemia, unspecified: Secondary | ICD-10-CM | POA: Diagnosis not present

## 2021-06-07 DIAGNOSIS — K5641 Fecal impaction: Secondary | ICD-10-CM | POA: Diagnosis not present

## 2021-06-07 DIAGNOSIS — K219 Gastro-esophageal reflux disease without esophagitis: Secondary | ICD-10-CM | POA: Diagnosis not present

## 2021-06-07 DIAGNOSIS — F32A Depression, unspecified: Secondary | ICD-10-CM | POA: Diagnosis not present

## 2021-06-07 DIAGNOSIS — F419 Anxiety disorder, unspecified: Secondary | ICD-10-CM | POA: Diagnosis not present

## 2021-06-07 DIAGNOSIS — M353 Polymyalgia rheumatica: Secondary | ICD-10-CM | POA: Diagnosis not present

## 2021-06-07 DIAGNOSIS — M19012 Primary osteoarthritis, left shoulder: Secondary | ICD-10-CM | POA: Diagnosis not present

## 2021-06-08 DIAGNOSIS — K5641 Fecal impaction: Secondary | ICD-10-CM | POA: Diagnosis not present

## 2021-06-08 DIAGNOSIS — B379 Candidiasis, unspecified: Secondary | ICD-10-CM | POA: Diagnosis not present

## 2021-06-08 DIAGNOSIS — B3741 Candidal cystitis and urethritis: Secondary | ICD-10-CM | POA: Diagnosis not present

## 2021-06-08 DIAGNOSIS — A401 Sepsis due to streptococcus, group B: Secondary | ICD-10-CM | POA: Diagnosis not present

## 2021-06-08 DIAGNOSIS — Z1612 Extended spectrum beta lactamase (ESBL) resistance: Secondary | ICD-10-CM | POA: Diagnosis not present

## 2021-06-08 DIAGNOSIS — A427 Actinomycotic sepsis: Secondary | ICD-10-CM | POA: Diagnosis not present

## 2021-06-10 DIAGNOSIS — Z20822 Contact with and (suspected) exposure to covid-19: Secondary | ICD-10-CM | POA: Diagnosis not present

## 2021-06-12 DIAGNOSIS — A427 Actinomycotic sepsis: Secondary | ICD-10-CM | POA: Diagnosis not present

## 2021-06-12 DIAGNOSIS — B3741 Candidal cystitis and urethritis: Secondary | ICD-10-CM | POA: Diagnosis not present

## 2021-06-12 DIAGNOSIS — B379 Candidiasis, unspecified: Secondary | ICD-10-CM | POA: Diagnosis not present

## 2021-06-12 DIAGNOSIS — Z1612 Extended spectrum beta lactamase (ESBL) resistance: Secondary | ICD-10-CM | POA: Diagnosis not present

## 2021-06-12 DIAGNOSIS — K5641 Fecal impaction: Secondary | ICD-10-CM | POA: Diagnosis not present

## 2021-06-12 DIAGNOSIS — A401 Sepsis due to streptococcus, group B: Secondary | ICD-10-CM | POA: Diagnosis not present

## 2021-06-18 DIAGNOSIS — K5641 Fecal impaction: Secondary | ICD-10-CM | POA: Diagnosis not present

## 2021-06-18 DIAGNOSIS — B379 Candidiasis, unspecified: Secondary | ICD-10-CM | POA: Diagnosis not present

## 2021-06-18 DIAGNOSIS — A401 Sepsis due to streptococcus, group B: Secondary | ICD-10-CM | POA: Diagnosis not present

## 2021-06-18 DIAGNOSIS — Z1612 Extended spectrum beta lactamase (ESBL) resistance: Secondary | ICD-10-CM | POA: Diagnosis not present

## 2021-06-18 DIAGNOSIS — A427 Actinomycotic sepsis: Secondary | ICD-10-CM | POA: Diagnosis not present

## 2021-06-18 DIAGNOSIS — B3741 Candidal cystitis and urethritis: Secondary | ICD-10-CM | POA: Diagnosis not present

## 2021-06-19 DIAGNOSIS — Z1612 Extended spectrum beta lactamase (ESBL) resistance: Secondary | ICD-10-CM | POA: Diagnosis not present

## 2021-06-19 DIAGNOSIS — A401 Sepsis due to streptococcus, group B: Secondary | ICD-10-CM | POA: Diagnosis not present

## 2021-06-19 DIAGNOSIS — K5641 Fecal impaction: Secondary | ICD-10-CM | POA: Diagnosis not present

## 2021-06-19 DIAGNOSIS — A427 Actinomycotic sepsis: Secondary | ICD-10-CM | POA: Diagnosis not present

## 2021-06-19 DIAGNOSIS — B379 Candidiasis, unspecified: Secondary | ICD-10-CM | POA: Diagnosis not present

## 2021-06-19 DIAGNOSIS — B3741 Candidal cystitis and urethritis: Secondary | ICD-10-CM | POA: Diagnosis not present

## 2021-06-22 DIAGNOSIS — A427 Actinomycotic sepsis: Secondary | ICD-10-CM | POA: Diagnosis not present

## 2021-06-22 DIAGNOSIS — B3741 Candidal cystitis and urethritis: Secondary | ICD-10-CM | POA: Diagnosis not present

## 2021-06-22 DIAGNOSIS — A401 Sepsis due to streptococcus, group B: Secondary | ICD-10-CM | POA: Diagnosis not present

## 2021-06-22 DIAGNOSIS — K5641 Fecal impaction: Secondary | ICD-10-CM | POA: Diagnosis not present

## 2021-06-22 DIAGNOSIS — Z1612 Extended spectrum beta lactamase (ESBL) resistance: Secondary | ICD-10-CM | POA: Diagnosis not present

## 2021-06-22 DIAGNOSIS — B379 Candidiasis, unspecified: Secondary | ICD-10-CM | POA: Diagnosis not present

## 2021-06-25 ENCOUNTER — Telehealth: Payer: Self-pay | Admitting: Internal Medicine

## 2021-06-25 DIAGNOSIS — J471 Bronchiectasis with (acute) exacerbation: Secondary | ICD-10-CM

## 2021-06-25 NOTE — Telephone Encounter (Signed)
Kayla Thompson Daughter called in to let somone know that the Rx was sent to pharmacy wrong,and it needs to be written correctly, so that her mom has enough for 3x a day right now it's written for 1x a day. The medication has 11 remaining refills for the 30 a month, and  the quantity should be 90 a month. Dr Adonis Housekeeper has to fax the Rx to the pharmacy at [phone]  ?

## 2021-06-26 ENCOUNTER — Other Ambulatory Visit: Payer: Medicare Other | Admitting: Family Medicine

## 2021-06-26 ENCOUNTER — Encounter: Payer: Self-pay | Admitting: Family Medicine

## 2021-06-26 VITALS — BP 98/56 | HR 82 | Temp 97.7°F | Resp 20

## 2021-06-26 DIAGNOSIS — M12812 Other specific arthropathies, not elsewhere classified, left shoulder: Secondary | ICD-10-CM

## 2021-06-26 DIAGNOSIS — M158 Other polyosteoarthritis: Secondary | ICD-10-CM

## 2021-06-26 DIAGNOSIS — B3731 Acute candidiasis of vulva and vagina: Secondary | ICD-10-CM | POA: Diagnosis not present

## 2021-06-26 DIAGNOSIS — E43 Unspecified severe protein-calorie malnutrition: Secondary | ICD-10-CM

## 2021-06-26 MED ORDER — IPRATROPIUM-ALBUTEROL 0.5-2.5 (3) MG/3ML IN SOLN: 3.0000 mL | RESPIRATORY_TRACT | 11 refills | Status: DC | PRN

## 2021-06-26 NOTE — Telephone Encounter (Signed)
Called and spoke with daughter who states that patient is needing new RX sent in because the patient is doing it 2-3 times a day and they are only giving patient a 30 day supply which is only enough for 1 time a day. New RX has been sent to [facility]. Nothing further needed at this time.  ?

## 2021-06-26 NOTE — Progress Notes (Signed)
? ? ?[facility] ?Community Palliative Care Consult Note ?Telephone: [phone]  ?Fax: [phone]  ? ? ?Date of encounter: 06/26/21 ?10:46 AM ?PATIENT NAME: Kayla Thompson ?[address] ?[place] [address]   ?[phone] (home) [phone] (work) ?DOB: 06/28/1935 ?MRN: [id number] ?PRIMARY CARE PROVIDER:    ?Crawford, Elizabeth A, MD,  ?[address] ?[place] [address] ?[phone] ? ?REFERRING PROVIDER:   ?Crawford, Elizabeth A, MD ?[address] ?[place],  [place] [address] ?[phone] ? ?RESPONSIBLE PARTY:    ?Contact Information   ? ? Name Relation Home Work Mobile  ? [name],RHONDA Daughter   [phone]  ? [name],[name] Daughter [phone]    ? alexander,Brittany Granddaughter   [phone]  ? ?  ? ? ? ?I met face to face with patient in her home. Palliative Care was asked to follow this patient by consultation request of  Crawford, Elizabeth A, * to address advance care planning and complex medical decision making. This is a follow up visit. ? ?                                 ASSESSMENT, SYMPTOM MANAGEMENT AND PLAN / RECOMMENDATIONS:  ? Rotator cuff arthropathy of bilat shoulders/OA of multiple sites ?National back order on all strengths of Oxycodone with APAP except 10mg /325 mg. ?Oxycodone 10 mg/325 mg po Q 6 hrs prn pain. ?Continue Tylenol 650 mg TID in between. ? ? Protein calorie malnutrition ?Continue supplement with ensure BID and add protein wherever possible to drinks or snacks. ? ? Vulvovaginal candidiasis ?Apply thin layer of Nystatin and triamcinolone creams to affected areas externally BID x 2 weeks. ?Other than those applications, use A & D or zinc paste with each episode of voiding or BM. ?After 2 weeks, change to topical Nystatin powder to affected areas BID ? ? ?Advance Care Planning/Goals of Care: Goals include to maximize quality of life and symptom management.  ?CODE STATUS: ?DNR ? ? ? ? ? ?Follow up Palliative Care Visit: Palliative care  will continue to follow for complex medical decision making, advance care planning, and clarification of goals. Return 2 weeks or prn. ? ? ?This visit was coded based on medical decision making (MDM). ? ?PPS: 30% ? ?HOSPICE ELIGIBILITY/DIAGNOSIS: TBD ? ?Chief Complaint:  ?Palliative Care is following for chronic medical management of chronic heart failure and bronchiectasis with pain management of OA in bilateral shoulders as well as severe protein calorie malnutrition. ? ?HISTORY OF PRESENT ILLNESS:  Kayla Thompson is a 86 y.o. year old female with  chronic pain issues related to torn rotator cuffs/fused vertebrae/hx of broken pelvis and hip.  Pt has DM, COPD with bronchiectasis, CHF, hypothyroidism, chronic UTI, colitis and neuropathy. .  ?Denies pain if not moving otherwise has pain in bilateral shoulders. Denies CP, SOB, falls.  Eating well, no weight loss or choking episodes.  Sleeping well.  Denies fever, constipation and dysuria. Drinking 2 ensures daily. Caregiver and daughter both echo that pt is currently doing well and her only issue is that she is continually red and irritated in her groin and vaginal area, particularly worse if she wears pants.  Small amount of itching and they have tried A & D ointment, zinc, and Boudreaux's butt paste. [name] will be travelling home to [place] for 3 weeks.  Pt will have in home caregivers while daughter is away..                                                                                   ? ?  History obtained from review of EMR, discussion with paid caregiver, daughter and/or Ms. Mclennan.  ?I reviewed available labs, medications, imaging, studies and related documents from the EMR.  Records reviewed and summarized above.  ? ?ROS ? ?General: NAD ?EYES: denies vision changes ?ENMT: denies dysphagia ?Cardiovascular: denies chest pain, denies DOE ?Pulmonary: denies cough, denies increased SOB ?Abdomen: endorses good appetite, denies constipation, endorses continence  of bowel ?GU: denies dysuria, endorses continence of urine ?MSK:  denies increased weakness,  no falls reported ?Skin: denies rashes or wounds ?Neurological: denies pain, denies insomnia ?Psych: Endorses positive mood ?Heme/lymph/immuno: denies bruises, abnormal bleeding ? ?Physical Exam: ?Current and past weights: ?Constitutional: NAD ?General: frail appearing, thin ?EYES: anicteric sclera, lids intact, no discharge  ?ENMT: hard of hearing, oral mucous membranes moist, dentition intact ?CV: S1S2, RRR with holosystolic grade III LUSB murmur, no LE edema, hands cool and pale, feet cool and cyanotic ?Pulmonary: CTAB, no increased work of breathing, no cough, room air ?Abdomen: normo-active BS + 4 quadrants, soft and non tender, no ascites ?GU: external genitalia, intertriginous areas are erythematous with mild white coating of skin not consistent with paste. ?MSK: noted generalized sarcopenia, moves all extremities, ambulatory with rollator ?Skin: warm and dry, no rashes or wounds on visible skin ?Neuro:  no generalized weakness,  no cognitive impairment ?Psych: non-anxious affect, A and O x 3 ?Hem/lymph/immuno: no widespread bruising ? ? ?Thank you for the opportunity to participate in the care of Ms. Aamodt.  The palliative care team will continue to follow. Please call our office at [phone] if we can be of additional assistance.  ? ? A , FNP -C ? ?COVID-19 PATIENT SCREENING TOOL ?Asked and negative response unless otherwise noted:  ? ?Have you had symptoms of covid, tested positive or been in contact with someone with symptoms/positive test in the past 5-10 days?  No ? ?

## 2021-06-27 DIAGNOSIS — A401 Sepsis due to streptococcus, group B: Secondary | ICD-10-CM | POA: Diagnosis not present

## 2021-06-27 DIAGNOSIS — K5641 Fecal impaction: Secondary | ICD-10-CM | POA: Diagnosis not present

## 2021-06-27 DIAGNOSIS — A427 Actinomycotic sepsis: Secondary | ICD-10-CM | POA: Diagnosis not present

## 2021-06-27 DIAGNOSIS — B379 Candidiasis, unspecified: Secondary | ICD-10-CM | POA: Diagnosis not present

## 2021-06-27 DIAGNOSIS — B3741 Candidal cystitis and urethritis: Secondary | ICD-10-CM | POA: Diagnosis not present

## 2021-06-27 DIAGNOSIS — B3731 Acute candidiasis of vulva and vagina: Secondary | ICD-10-CM | POA: Insufficient documentation

## 2021-06-27 DIAGNOSIS — Z1612 Extended spectrum beta lactamase (ESBL) resistance: Secondary | ICD-10-CM | POA: Diagnosis not present

## 2021-06-29 DIAGNOSIS — Z1612 Extended spectrum beta lactamase (ESBL) resistance: Secondary | ICD-10-CM | POA: Diagnosis not present

## 2021-06-29 DIAGNOSIS — B3741 Candidal cystitis and urethritis: Secondary | ICD-10-CM | POA: Diagnosis not present

## 2021-06-29 DIAGNOSIS — B379 Candidiasis, unspecified: Secondary | ICD-10-CM | POA: Diagnosis not present

## 2021-06-29 DIAGNOSIS — K5641 Fecal impaction: Secondary | ICD-10-CM | POA: Diagnosis not present

## 2021-06-29 DIAGNOSIS — A427 Actinomycotic sepsis: Secondary | ICD-10-CM | POA: Diagnosis not present

## 2021-06-29 DIAGNOSIS — A401 Sepsis due to streptococcus, group B: Secondary | ICD-10-CM | POA: Diagnosis not present

## 2021-07-06 DIAGNOSIS — B379 Candidiasis, unspecified: Secondary | ICD-10-CM | POA: Diagnosis not present

## 2021-07-06 DIAGNOSIS — A401 Sepsis due to streptococcus, group B: Secondary | ICD-10-CM | POA: Diagnosis not present

## 2021-07-06 DIAGNOSIS — B3741 Candidal cystitis and urethritis: Secondary | ICD-10-CM | POA: Diagnosis not present

## 2021-07-06 DIAGNOSIS — A427 Actinomycotic sepsis: Secondary | ICD-10-CM | POA: Diagnosis not present

## 2021-07-06 DIAGNOSIS — Z1612 Extended spectrum beta lactamase (ESBL) resistance: Secondary | ICD-10-CM | POA: Diagnosis not present

## 2021-07-06 DIAGNOSIS — K5641 Fecal impaction: Secondary | ICD-10-CM | POA: Diagnosis not present

## 2021-07-08 ENCOUNTER — Other Ambulatory Visit: Payer: Medicare Other | Admitting: Family Medicine

## 2021-07-13 ENCOUNTER — Encounter: Payer: Self-pay | Admitting: Family Medicine

## 2021-07-13 ENCOUNTER — Other Ambulatory Visit: Payer: Medicare Other | Admitting: Family Medicine

## 2021-07-13 VITALS — BP 132/86 | HR 61 | Resp 20

## 2021-07-13 DIAGNOSIS — M12812 Other specific arthropathies, not elsewhere classified, left shoulder: Secondary | ICD-10-CM | POA: Diagnosis not present

## 2021-07-13 DIAGNOSIS — B3731 Acute candidiasis of vulva and vagina: Secondary | ICD-10-CM

## 2021-07-13 NOTE — Progress Notes (Signed)
? ? ?[facility] ?Community Palliative Care Consult Note ?Telephone: [phone]  ?Fax: [phone]  ? ? ?Date of encounter: 07/13/21 ?11:11 PM ?PATIENT NAME: Kayla Thompson ?[address] ?[place] [address]   ?[phone] (home) [phone] (work) ?DOB: [birth year] ?MRN: [id number] ?PRIMARY CARE PROVIDER:    ?Kayla Koch, MD,  ?[name] [address] ?[phone] ? ?REFERRING PROVIDER:   ?Kayla Koch, MD ?[place],  [name] [address] ?[phone] ? ?RESPONSIBLE PARTY:    ?Contact Information   ? ? Name Relation Home Work Mobile  ? Kayla Thompson Daughter   [phone]  ? Kayla Thompson Daughter [phone]    ? Kayla Thompson Granddaughter   [phone]  ? ?  ? ? ? ?I met face to face with patient in her home. Palliative Care was asked to follow this patient by consultation request of  Kayla Thompson, * to address advance care planning and complex medical decision making. This is a follow up visit. ? ?                                 ASSESSMENT, SYMPTOM MANAGEMENT AND PLAN / RECOMMENDATIONS:  ? Rotator cuff arthropathy of bilat shoulders/OA of multiple sites ?Pain controlled on current regimen ?National back order on all strengths of Oxycodone with APAP except 50m /325 mg. ?Oxycodone 10 mg/325 mg po Q 6 hrs prn pain. ?Continue Tylenol 650 mg TID in between. ? ? ? Vulvovaginal candidiasis ?Improving. ?Continue thin layer Nystatin BID and hold triamcinolone creams to affected areas externally ?Other than those applications, use A & D or zinc paste with each episode of voiding or BM. ? ? ?Advance Care Planning/Goals of Care: Goals include to maximize quality of life and symptom management.  ?CODE STATUS: ?DNR ? ? ? ? ? ?Follow up Palliative Care Visit: Palliative care will continue to follow for complex medical decision making, advance care planning, and clarification of goals. Return 4 weeks or prn. ? ? ?This visit was  coded based on medical decision making (MDM). ? ?PPS: 30% ? ?HOSPICE ELIGIBILITY/DIAGNOSIS: TBD ? ?Chief Complaint:  ?Palliative Care is following for chronic medical management of chronic heart failure and bronchiectasis with pain management of OA in bilateral shoulders. ? ?HISTORY OF PRESENT ILLNESS:  Kayla MANAGOis a 86y.o. year old female with  chronic pain issues related to torn rotator cuffs/fused vertebrae/hx of broken pelvis and hip.  Pt has DM, COPD with bronchiectasis, CHF, hypothyroidism, chronic UTI, colitis and neuropathy.   ?Denies pain if not moving otherwise has pain in bilateral shoulders. Denies CP, SOB, falls.  Good appetite, sleeping well.  Occasional issues with constipation. Drinking 2 ensures daily. [name] Kayla Hawwas visiting her today [place]. Caregiver states slow improvement of all prior areas. In home caregivers remain with one exception of a person who did not change or offer any assistance to patient.                                                                   ? ?History obtained from review of EMR, discussion with paid caregiver, daughter Kayla Hawand/or Kayla Thompson.  ?  I reviewed available labs, medications, imaging, studies and related documents from the EMR.  Records reviewed and summarized above.  ? ?ROS ? ?General: NAD ?EYES: denies vision changes ?ENMT: denies dysphagia ?Cardiovascular: denies chest pain, denies DOE ?Pulmonary: denies cough, denies increased SOB ?Abdomen: endorses good appetite, endorses intermittent constipation, endorses continence of bowel ?GU: denies dysuria, endorses continence of urine ?MSK:  denies increased weakness,  no falls reported ?Skin: denies rashes or wounds ?Neurological: denies insomnia ?Psych: Endorses positive mood ?Heme/lymph/immuno: denies bruises, abnormal bleeding ? ?Physical Exam: ?Current and past weights: ?Constitutional: NAD 87 lbs as of 04/2021 ?General: frail appearing, thin ?EYES:  anicteric sclera, lids intact, no discharge  ?ENMT: hard of hearing, oral mucous membranes moist, dentition intact ?CV: S1S2, RRR with holosystolic grade III LUSB murmur, no LE edema, hands cool and pale, feet cool and slightly dusky bilaterally ?Pulmonary: CTAB, no increased work of breathing, no cough, room air ?Abdomen: hypo-active BS + 4 quadrants, soft and non tender, no ascites ?[id number] per pt request ?MSK: noted generalized sarcopenia, moves all extremities ?Skin: warm and dry, no rashes or wounds on visible skin ?Neuro:  no generalized weakness,  no cognitive impairment ?Psych: non-anxious affect, A and O x 3 ?Hem/lymph/immuno: no widespread bruising ? ? ?Thank you for the opportunity to participate in the care of Kayla Thompson.  The palliative care team will continue to follow. Please call our office at [phone] if we can be of additional assistance.  ? ?Kayla Conception, Kayla Thompson ? ?COVID-19 PATIENT SCREENING TOOL ?Asked and negative response unless otherwise noted:  ? ?Have you had symptoms of covid, tested positive or been in contact with someone with symptoms/positive test in the past 5-10 days?  No ? ?

## 2021-07-14 DIAGNOSIS — Z20822 Contact with and (suspected) exposure to covid-19: Secondary | ICD-10-CM | POA: Diagnosis not present

## 2021-07-16 DIAGNOSIS — Z20822 Contact with and (suspected) exposure to covid-19: Secondary | ICD-10-CM | POA: Diagnosis not present

## 2021-07-21 ENCOUNTER — Ambulatory Visit: Payer: Medicare Other

## 2021-07-24 DIAGNOSIS — N3000 Acute cystitis without hematuria: Secondary | ICD-10-CM | POA: Diagnosis not present

## 2021-07-24 DIAGNOSIS — N952 Postmenopausal atrophic vaginitis: Secondary | ICD-10-CM | POA: Diagnosis not present

## 2021-07-25 ENCOUNTER — Encounter (HOSPITAL_COMMUNITY): Payer: Self-pay | Admitting: Family Medicine

## 2021-07-25 ENCOUNTER — Emergency Department (HOSPITAL_BASED_OUTPATIENT_CLINIC_OR_DEPARTMENT_OTHER): Payer: Medicare Other

## 2021-07-25 ENCOUNTER — Other Ambulatory Visit: Payer: Self-pay | Admitting: Internal Medicine

## 2021-07-25 ENCOUNTER — Other Ambulatory Visit: Payer: Self-pay

## 2021-07-25 ENCOUNTER — Inpatient Hospital Stay (HOSPITAL_BASED_OUTPATIENT_CLINIC_OR_DEPARTMENT_OTHER)
Admission: EM | Admit: 2021-07-25 | Discharge: 2021-07-29 | DRG: 689 | Disposition: A | Discharge status: Home Health | Payer: Medicare Other | Attending: Family Medicine | Admitting: Family Medicine

## 2021-07-25 DIAGNOSIS — Z66 Do not resuscitate: Secondary | ICD-10-CM | POA: Diagnosis present

## 2021-07-25 DIAGNOSIS — Z8543 Personal history of malignant neoplasm of ovary: Secondary | ICD-10-CM

## 2021-07-25 DIAGNOSIS — E785 Hyperlipidemia, unspecified: Secondary | ICD-10-CM | POA: Diagnosis present

## 2021-07-25 DIAGNOSIS — G9341 Metabolic encephalopathy: Secondary | ICD-10-CM | POA: Diagnosis present

## 2021-07-25 DIAGNOSIS — E871 Hypo-osmolality and hyponatremia: Secondary | ICD-10-CM | POA: Diagnosis not present

## 2021-07-25 DIAGNOSIS — Z79890 Hormone replacement therapy (postmenopausal): Secondary | ICD-10-CM

## 2021-07-25 DIAGNOSIS — E1165 Type 2 diabetes mellitus with hyperglycemia: Secondary | ICD-10-CM | POA: Diagnosis present

## 2021-07-25 DIAGNOSIS — Z8616 Personal history of COVID-19: Secondary | ICD-10-CM

## 2021-07-25 DIAGNOSIS — Z7952 Long term (current) use of systemic steroids: Secondary | ICD-10-CM | POA: Diagnosis not present

## 2021-07-25 DIAGNOSIS — B9620 Unspecified Escherichia coli [E. coli] as the cause of diseases classified elsewhere: Secondary | ICD-10-CM | POA: Diagnosis present

## 2021-07-25 DIAGNOSIS — I110 Hypertensive heart disease with heart failure: Secondary | ICD-10-CM | POA: Diagnosis present

## 2021-07-25 DIAGNOSIS — N3000 Acute cystitis without hematuria: Secondary | ICD-10-CM | POA: Diagnosis not present

## 2021-07-25 DIAGNOSIS — R0602 Shortness of breath: Secondary | ICD-10-CM | POA: Diagnosis not present

## 2021-07-25 DIAGNOSIS — Z1612 Extended spectrum beta lactamase (ESBL) resistance: Secondary | ICD-10-CM | POA: Diagnosis not present

## 2021-07-25 DIAGNOSIS — K219 Gastro-esophageal reflux disease without esophagitis: Secondary | ICD-10-CM | POA: Diagnosis present

## 2021-07-25 DIAGNOSIS — Z91048 Other nonmedicinal substance allergy status: Secondary | ICD-10-CM

## 2021-07-25 DIAGNOSIS — Z8249 Family history of ischemic heart disease and other diseases of the circulatory system: Secondary | ICD-10-CM

## 2021-07-25 DIAGNOSIS — E039 Hypothyroidism, unspecified: Secondary | ICD-10-CM | POA: Diagnosis not present

## 2021-07-25 DIAGNOSIS — K5900 Constipation, unspecified: Secondary | ICD-10-CM | POA: Diagnosis present

## 2021-07-25 DIAGNOSIS — Z79818 Long term (current) use of other agents affecting estrogen receptors and estrogen levels: Secondary | ICD-10-CM

## 2021-07-25 DIAGNOSIS — M353 Polymyalgia rheumatica: Secondary | ICD-10-CM | POA: Diagnosis not present

## 2021-07-25 DIAGNOSIS — G9340 Encephalopathy, unspecified: Secondary | ICD-10-CM | POA: Diagnosis not present

## 2021-07-25 DIAGNOSIS — J9611 Chronic respiratory failure with hypoxia: Secondary | ICD-10-CM | POA: Diagnosis not present

## 2021-07-25 DIAGNOSIS — N390 Urinary tract infection, site not specified: Secondary | ICD-10-CM | POA: Diagnosis not present

## 2021-07-25 DIAGNOSIS — Z87440 Personal history of urinary (tract) infections: Secondary | ICD-10-CM | POA: Diagnosis not present

## 2021-07-25 DIAGNOSIS — M069 Rheumatoid arthritis, unspecified: Secondary | ICD-10-CM | POA: Diagnosis present

## 2021-07-25 DIAGNOSIS — F32A Depression, unspecified: Secondary | ICD-10-CM | POA: Diagnosis present

## 2021-07-25 DIAGNOSIS — Z79899 Other long term (current) drug therapy: Secondary | ICD-10-CM

## 2021-07-25 DIAGNOSIS — D7589 Other specified diseases of blood and blood-forming organs: Secondary | ICD-10-CM

## 2021-07-25 DIAGNOSIS — Z515 Encounter for palliative care: Secondary | ICD-10-CM

## 2021-07-25 DIAGNOSIS — R4182 Altered mental status, unspecified: Secondary | ICD-10-CM | POA: Diagnosis not present

## 2021-07-25 DIAGNOSIS — E559 Vitamin D deficiency, unspecified: Secondary | ICD-10-CM | POA: Diagnosis present

## 2021-07-25 DIAGNOSIS — Z794 Long term (current) use of insulin: Secondary | ICD-10-CM

## 2021-07-25 DIAGNOSIS — Z833 Family history of diabetes mellitus: Secondary | ICD-10-CM

## 2021-07-25 DIAGNOSIS — J449 Chronic obstructive pulmonary disease, unspecified: Secondary | ICD-10-CM | POA: Diagnosis not present

## 2021-07-25 DIAGNOSIS — Z7951 Long term (current) use of inhaled steroids: Secondary | ICD-10-CM

## 2021-07-25 DIAGNOSIS — B9629 Other Escherichia coli [E. coli] as the cause of diseases classified elsewhere: Secondary | ICD-10-CM | POA: Diagnosis not present

## 2021-07-25 DIAGNOSIS — M810 Age-related osteoporosis without current pathological fracture: Secondary | ICD-10-CM | POA: Diagnosis present

## 2021-07-25 DIAGNOSIS — I5032 Chronic diastolic (congestive) heart failure: Secondary | ICD-10-CM | POA: Diagnosis present

## 2021-07-25 DIAGNOSIS — Z9221 Personal history of antineoplastic chemotherapy: Secondary | ICD-10-CM

## 2021-07-25 DIAGNOSIS — D638 Anemia in other chronic diseases classified elsewhere: Secondary | ICD-10-CM

## 2021-07-25 DIAGNOSIS — M797 Fibromyalgia: Secondary | ICD-10-CM | POA: Diagnosis present

## 2021-07-25 DIAGNOSIS — Z20822 Contact with and (suspected) exposure to covid-19: Secondary | ICD-10-CM | POA: Diagnosis present

## 2021-07-25 DIAGNOSIS — Z90722 Acquired absence of ovaries, bilateral: Secondary | ICD-10-CM | POA: Diagnosis not present

## 2021-07-25 DIAGNOSIS — D539 Nutritional anemia, unspecified: Secondary | ICD-10-CM | POA: Diagnosis present

## 2021-07-25 DIAGNOSIS — R9431 Abnormal electrocardiogram [ECG] [EKG]: Secondary | ICD-10-CM | POA: Diagnosis not present

## 2021-07-25 DIAGNOSIS — G8929 Other chronic pain: Secondary | ICD-10-CM | POA: Diagnosis present

## 2021-07-25 DIAGNOSIS — Z91040 Latex allergy status: Secondary | ICD-10-CM

## 2021-07-25 DIAGNOSIS — D72829 Elevated white blood cell count, unspecified: Secondary | ICD-10-CM | POA: Diagnosis present

## 2021-07-25 LAB — CBC WITH DIFFERENTIAL/PLATELET
Abs Immature Granulocytes: 0.07 10*3/uL (ref 0.00–0.07)
Basophils Absolute: 0.1 10*3/uL (ref 0.0–0.1)
Basophils Relative: 0 %
Eosinophils Absolute: 0.3 10*3/uL (ref 0.0–0.5)
Eosinophils Relative: 2 %
HCT: 33.9 % — ABNORMAL LOW (ref 36.0–46.0)
Hemoglobin: 10.9 g/dL — ABNORMAL LOW (ref 12.0–15.0)
Immature Granulocytes: 0 %
Lymphocytes Relative: 15 %
Lymphs Abs: 2.7 10*3/uL (ref 0.7–4.0)
MCH: 32.8 pg (ref 26.0–34.0)
MCHC: 32.2 g/dL (ref 30.0–36.0)
MCV: 102.1 fL — ABNORMAL HIGH (ref 80.0–100.0)
Monocytes Absolute: 1.2 10*3/uL — ABNORMAL HIGH (ref 0.1–1.0)
Monocytes Relative: 7 %
Neutro Abs: 13.6 10*3/uL — ABNORMAL HIGH (ref 1.7–7.7)
Neutrophils Relative %: 76 %
Platelets: 264 10*3/uL (ref 150–400)
RBC: 3.32 MIL/uL — ABNORMAL LOW (ref 3.87–5.11)
RDW: 12.8 % (ref 11.5–15.5)
WBC: 17.8 10*3/uL — ABNORMAL HIGH (ref 4.0–10.5)
nRBC: 0 % (ref 0.0–0.2)

## 2021-07-25 LAB — URINALYSIS, ROUTINE W REFLEX MICROSCOPIC
Bilirubin Urine: NEGATIVE
Glucose, UA: NEGATIVE mg/dL
Hgb urine dipstick: NEGATIVE
Ketones, ur: NEGATIVE mg/dL
Nitrite: POSITIVE — AB
Protein, ur: 100 mg/dL — AB
Specific Gravity, Urine: 1.021 (ref 1.005–1.030)
WBC, UA: >50 WBC/hpf — ABNORMAL HIGH (ref 0–5)
pH: 6 (ref 5.0–8.0)

## 2021-07-25 LAB — COMPREHENSIVE METABOLIC PANEL
ALT: 23 U/L (ref 0–44)
AST: 11 U/L — ABNORMAL LOW (ref 15–41)
Albumin: 4 g/dL (ref 3.5–5.0)
Alkaline Phosphatase: 42 U/L (ref 38–126)
Anion gap: 9 (ref 5–15)
BUN: 34 mg/dL — ABNORMAL HIGH (ref 8–23)
CO2: 26 mmol/L (ref 22–32)
Calcium: 9.8 mg/dL (ref 8.9–10.3)
Chloride: 100 mmol/L (ref 98–111)
Creatinine, Ser: 0.93 mg/dL (ref 0.44–1.00)
GFR, Estimated: >60 mL/min (ref >60)
Glucose, Bld: 214 mg/dL — ABNORMAL HIGH (ref 70–99)
Potassium: 4.1 mmol/L (ref 3.5–5.1)
Sodium: 135 mmol/L (ref 135–145)
Total Bilirubin: 0.5 mg/dL (ref 0.3–1.2)
Total Protein: 6.7 g/dL (ref 6.5–8.1)

## 2021-07-25 LAB — LIPASE, BLOOD: Lipase: <10 U/L — ABNORMAL LOW (ref 11–51)

## 2021-07-25 LAB — LACTIC ACID, PLASMA: Lactic Acid, Venous: 1.3 mmol/L (ref 0.5–1.9)

## 2021-07-25 LAB — CBG MONITORING, ED
Glucose-Capillary: 220 mg/dL — ABNORMAL HIGH (ref 70–99)
Glucose-Capillary: 196 mg/dL — ABNORMAL HIGH (ref 70–99)

## 2021-07-25 LAB — RESP PANEL BY RT-PCR (FLU A&B, COVID) ARPGX2
Influenza A by PCR: NEGATIVE
Influenza B by PCR: NEGATIVE
SARS Coronavirus 2 by RT PCR: NEGATIVE

## 2021-07-25 LAB — TROPONIN I (HIGH SENSITIVITY): Troponin I (High Sensitivity): 8 ng/L (ref <18)

## 2021-07-25 LAB — GLUCOSE, CAPILLARY: Glucose-Capillary: 144 mg/dL — ABNORMAL HIGH (ref 70–99)

## 2021-07-25 MED ORDER — PANTOPRAZOLE SODIUM 40 MG PO TBEC
40.0000 mg | DELAYED_RELEASE_TABLET | Freq: Every day | ORAL | Status: DC
  Administered 2021-07-25 – 2021-07-29 (×5): 40 mg via ORAL
  Filled 2021-07-25 (×5): qty 1

## 2021-07-25 MED ORDER — INSULIN GLARGINE-YFGN 100 UNIT/ML ~~LOC~~ SOLN
5.0000 [IU] | Freq: Every day | SUBCUTANEOUS | Status: DC
  Administered 2021-07-26 – 2021-07-29 (×4): 5 [IU] via SUBCUTANEOUS
  Filled 2021-07-25 (×5): qty 0.05

## 2021-07-25 MED ORDER — INSULIN ASPART 100 UNIT/ML IJ SOLN
0.0000 [IU] | Freq: Three times a day (TID) | INTRAMUSCULAR | Status: DC
  Administered 2021-07-26: 1 [IU] via SUBCUTANEOUS
  Administered 2021-07-26: 2 [IU] via SUBCUTANEOUS
  Administered 2021-07-27: 3 [IU] via SUBCUTANEOUS
  Administered 2021-07-27 (×2): 2 [IU] via SUBCUTANEOUS
  Administered 2021-07-28: 1 [IU] via SUBCUTANEOUS
  Administered 2021-07-28: 3 [IU] via SUBCUTANEOUS
  Administered 2021-07-29: 1 [IU] via SUBCUTANEOUS

## 2021-07-25 MED ORDER — PREDNISONE 1 MG PO TABS
2.0000 mg | ORAL_TABLET | Freq: Every day | ORAL | Status: DC
  Administered 2021-07-26 – 2021-07-29 (×4): 2 mg via ORAL
  Filled 2021-07-25 (×4): qty 2

## 2021-07-25 MED ORDER — ESTRADIOL 0.1 MG/GM VA CREA
1.0000 | TOPICAL_CREAM | VAGINAL | Status: DC
  Filled 2021-07-25 (×2): qty 42.5

## 2021-07-25 MED ORDER — IPRATROPIUM-ALBUTEROL 0.5-2.5 (3) MG/3ML IN SOLN: 3.0000 mL | RESPIRATORY_TRACT | Status: DC | PRN

## 2021-07-25 MED ORDER — LACTATED RINGERS IV SOLN: INTRAVENOUS | Status: DC

## 2021-07-25 MED ORDER — POLYETHYLENE GLYCOL 3350 17 G PO PACK
17.0000 g | PACK | Freq: Two times a day (BID) | ORAL | Status: DC
  Administered 2021-07-25 – 2021-07-29 (×5): 17 g via ORAL
  Filled 2021-07-25 (×6): qty 1

## 2021-07-25 MED ORDER — OXYCODONE-ACETAMINOPHEN 5-325 MG PO TABS
1.0000 | ORAL_TABLET | Freq: Once | ORAL | Status: AC
  Administered 2021-07-25: 1 via ORAL
  Filled 2021-07-25: qty 1

## 2021-07-25 MED ORDER — INSULIN GLARGINE-YFGN 100 UNIT/ML ~~LOC~~ SOLN
15.0000 [IU] | Freq: Once | SUBCUTANEOUS | Status: AC
Start: 2021-07-25 — End: 2021-07-25
  Administered 2021-07-25: 5 [IU] via SUBCUTANEOUS
  Filled 2021-07-25: qty 10

## 2021-07-25 MED ORDER — INSULIN ASPART 100 UNIT/ML IJ SOLN
2.0000 [IU] | Freq: Three times a day (TID) | INTRAMUSCULAR | Status: DC
  Administered 2021-07-26 – 2021-07-29 (×6): 2 [IU] via SUBCUTANEOUS

## 2021-07-25 MED ORDER — DULOXETINE HCL 30 MG PO CPEP
30.0000 mg | ORAL_CAPSULE | Freq: Every day | ORAL | Status: DC
Start: 2021-07-26 — End: 2021-07-29
  Administered 2021-07-26 – 2021-07-29 (×4): 30 mg via ORAL
  Filled 2021-07-25 (×4): qty 1

## 2021-07-25 MED ORDER — SODIUM CHLORIDE 0.9 % IV BOLUS
500.0000 mL | Freq: Once | INTRAVENOUS | Status: AC
  Administered 2021-07-25: 500 mL via INTRAVENOUS

## 2021-07-25 MED ORDER — LEVOTHYROXINE SODIUM 50 MCG PO TABS
50.0000 ug | ORAL_TABLET | Freq: Every day | ORAL | Status: DC
  Administered 2021-07-26 – 2021-07-29 (×4): 50 ug via ORAL
  Filled 2021-07-25 (×4): qty 1

## 2021-07-25 MED ORDER — VITAMIN B-12 1000 MCG PO TABS: 2500.0000 ug | ORAL_TABLET | ORAL | Status: DC

## 2021-07-25 MED ORDER — SODIUM CHLORIDE 0.9 % IV SOLN
1.0000 g | Freq: Two times a day (BID) | INTRAVENOUS | Status: DC
  Administered 2021-07-25 – 2021-07-29 (×8): 1 g via INTRAVENOUS
  Filled 2021-07-25 (×9): qty 20

## 2021-07-25 MED ORDER — MUCINEX DM MAXIMUM STRENGTH 60-1200 MG PO TB12: 1.0000 | ORAL_TABLET | Freq: Two times a day (BID) | ORAL | Status: DC

## 2021-07-25 MED ORDER — CEFTRIAXONE SODIUM 1 G IJ SOLR
1.0000 g | Freq: Once | INTRAMUSCULAR | Status: AC
Start: 2021-07-25 — End: 2021-07-25
  Administered 2021-07-25: 1 g via INTRAVENOUS
  Filled 2021-07-25: qty 10

## 2021-07-25 MED ORDER — LINACLOTIDE 72 MCG PO CAPS
72.0000 ug | ORAL_CAPSULE | Freq: Every day | ORAL | Status: DC
  Administered 2021-07-26 – 2021-07-29 (×4): 72 ug via ORAL
  Filled 2021-07-25 (×4): qty 1

## 2021-07-25 MED ORDER — VITAMIN B 12 500 MCG PO TABS
2500.0000 ug | ORAL_TABLET | ORAL | Status: DC
Start: 2021-07-30 — End: 2021-07-25

## 2021-07-25 MED ORDER — DM-GUAIFENESIN ER 30-600 MG PO TB12
2.0000 | ORAL_TABLET | Freq: Two times a day (BID) | ORAL | Status: DC
  Administered 2021-07-25 – 2021-07-29 (×7): 2 via ORAL
  Filled 2021-07-25 (×8): qty 2

## 2021-07-25 MED ORDER — OXYCODONE HCL 5 MG PO TABS
5.0000 mg | ORAL_TABLET | Freq: Once | ORAL | Status: AC
  Administered 2021-07-25: 5 mg via ORAL
  Filled 2021-07-25: qty 1

## 2021-07-25 MED ORDER — ENSURE ENLIVE PO LIQD
237.0000 mL | Freq: Three times a day (TID) | ORAL | Status: DC
  Administered 2021-07-26 – 2021-07-29 (×8): 237 mL via ORAL

## 2021-07-25 MED ORDER — HEPARIN SODIUM (PORCINE) 5000 UNIT/ML IJ SOLN
5000.0000 [IU] | Freq: Three times a day (TID) | INTRAMUSCULAR | Status: DC
  Administered 2021-07-25 – 2021-07-29 (×11): 5000 [IU] via SUBCUTANEOUS
  Filled 2021-07-25 (×11): qty 1

## 2021-07-25 MED ORDER — FLUTICASONE FUROATE-VILANTEROL 100-25 MCG/ACT IN AEPB
1.0000 | INHALATION_SPRAY | Freq: Every day | RESPIRATORY_TRACT | Status: DC
  Administered 2021-07-26 – 2021-07-27 (×2): 1 via RESPIRATORY_TRACT
  Filled 2021-07-25: qty 28

## 2021-07-25 MED ORDER — UMECLIDINIUM BROMIDE 62.5 MCG/ACT IN AEPB
1.0000 | INHALATION_SPRAY | Freq: Every day | RESPIRATORY_TRACT | Status: DC
  Administered 2021-07-26 – 2021-07-27 (×2): 1 via RESPIRATORY_TRACT
  Filled 2021-07-25: qty 7

## 2021-07-25 NOTE — ED Triage Notes (Signed)
Pt bib daughter, c/o AMS noted before bedtime, not following commands, worse today. Per daughter, pt's blood sugar elevated- 206, noted blood in stool- daughter reports constipation. Seen nephrology yesterday, urine culture sent but no results yet. H/O colitis, septic UTI, cystitis. Per daughter, pt is in [facility], has caregiver 24/7 and lives by herself. ?

## 2021-07-25 NOTE — H&P (Signed)
? ?HPI ? ?Kayla Thompson GHW:[id number] DOB: 05-Sep-1935 DOA: 07/25/2021 ? ?PCP: [name], MD  ? ?Chief Complaint: Altered mental status ? ?HPI:  ?86 year old white female home dwelling known polymyalgia rheumatica/rheumatoid on chronic prednisone (follows with Dr. Kathlene November) with chronically elevated white count ?DM TY 2 ?HFpEF ?Macrocytic anemia ?Asthma/COPD overlap on intermittent oxygen ?Ovarian cancer status post TAH/BSO ?Chronic left shoulder issues followed by [facility] ?COVID infection and recovered 03/31/2021-at that admission also had actinomyces bacteremia Rx doxycycline ?Frequent UTIs ?She had ESBL infection at that time in addition to possible yeast and fecal impaction-patient was discharged home on nitrofurantoin doxycycline fluconazole additionally ? ?Lives alone at home is followed by palliative care and has caregivers 24/7 ?Found to be encephalopathic compared to her baseline over 48 hours no new medications-unable to perform usual tasks-went to urologist apparently 5/6 and urine was sent for culture ? ?Daughter augments h/o--she couldn't do basic things--didn't know what to do with the glucose pen [normally can] ?Regularly constipated--had some bleeding on last hard stool ? ?Review of Systems:  ?Unable to obtain given superimposed dementia confusion secondary to this ? ?Pertinent +'s: ?Pertinent -"s: ? ?ED Course: In ED started on antibiotics with Rocephin she did not have SIRS nor concerns for sepsis when evaluated-started on saline 75 cc/H after 500 cc bolus given Semglee insulin 15 units and transferred to [facility] ? ? ?Past Medical History:  ?Diagnosis Date  ? Anxiety   ? Arthritis   ? Asthma   ? Cataracts, bilateral   ? removed by surgery  ? COPD (chronic obstructive pulmonary disease) ([place])   ? Depression   ? Diabetes mellitus without complication ([place])   ? type 2  ? Dyspnea   ? with exertion  ? Fibromyalgia   ? GERD (gastroesophageal reflux disease)   ? History of  chemotherapy   ? History of fractured pelvis   ? Hypertension   ? Hypothyroidism   ? Osteoporosis 12/2017  ? T score -2.8 stable from prior DEXA  ? Osteoporosis   ? Ovarian cancer ([place])   ? Pneumonia   ? several times  ? ?Past Surgical History:  ?Procedure Laterality Date  ? ABDOMINAL HYSTERECTOMY  1996  ? ANKLE FRACTURE SURGERY    ? plate and screws  ? BIOPSY  02/05/2019  ? Procedure: BIOPSY;  Surgeon: Irving Copas., MD;  Location: [place];  Service: Gastroenterology;;  ? BLADDER SUSPENSION    ? CATARACT EXTRACTION    ? COLONOSCOPY    ? ESOPHAGEAL BRUSHING  02/05/2019  ? Procedure: ESOPHAGEAL BRUSHING;  Surgeon: Rush Landmark Telford Nab., MD;  Location: [place];  Service: Gastroenterology;;  ? ESOPHAGOGASTRODUODENOSCOPY (EGD) WITH PROPOFOL N/A 02/05/2019  ? Procedure: ESOPHAGOGASTRODUODENOSCOPY (EGD) WITH PROPOFOL with dialtion;  Surgeon: Mansouraty, Telford Nab., MD;  Location: [place];  Service: Gastroenterology;  Laterality: N/A;  ? EYE SURGERY    ? cataracts removed-bilateral  ? hip surgey    ? knee surgey    ? LUNG SURGERY    ? OOPHORECTOMY    ? BSO  ? SAVORY DILATION N/A 02/05/2019  ? Procedure: SAVORY DILATION;  Surgeon: Irving Copas., MD;  Location: [place];  Service: Gastroenterology;  Laterality: N/A;  ? TONSILLECTOMY    ? UPPER GI ENDOSCOPY    ? ? reports that she has never smoked. She has never been exposed to tobacco smoke. She has never used smokeless tobacco. She reports that she does not drink alcohol and does not use drugs. ? ?Mobility:  Seems to baseline ? ?Allergies  ?Allergen Reactions  ? Tape Other (See Comments)  ?  SKIN IS VERY THIN AND DELICATE- will tear like paper!!  ? Lasix [Furosemide] Other (See Comments)  ?  Confusion- MD stopped this med  ? Latex Rash  ? Penicillins Rash  ?  Has patient had a PCN reaction causing immediate rash, facial/tongue/throat swelling, SOB or lightheadedness with hypotension: No ?Has patient had a PCN reaction causing severe  rash involving mucus membranes or skin necrosis: No ?Has patient had a PCN reaction that required hospitalization: No ?Has patient had a PCN reaction occurring within the last 10 years: No ?If all of the above answers are "NO", then may proceed with Cephalosporin use. ?  ? ?Family History  ?Problem Relation Age of Onset  ? Lung cancer Mother   ? CAD Father   ? Diabetes Father   ? Lung cancer Maternal Aunt   ? Diabetes Paternal Aunt   ? Diabetes Paternal Uncle   ? Diabetes Paternal Grandmother   ? Diabetes Paternal Grandfather   ? Colon cancer Neg Hx   ? Esophageal cancer Neg Hx   ? Inflammatory bowel disease Neg Hx   ? Liver disease Neg Hx   ? Pancreatic cancer Neg Hx   ? Rectal cancer Neg Hx   ? Stomach cancer Neg Hx   ? ?Prior to Admission medications   ?Medication Sig Start Date End Date Taking? Authorizing Provider  ?Ascorbic Acid (VITAMIN C) 1000 MG tablet Take 1,000 mg by mouth in the morning and at bedtime.   Yes [provider]  ?Calcium Carbonate (CALTRATE 600 PO) Take 600 mg by mouth in the morning and at bedtime.   Yes [provider]  ?Cyanocobalamin (VITAMIN B 12 PO) Take 2,500 mcg by mouth every Thursday.   Yes [provider]  ?docusate sodium (COLACE) 100 MG capsule Take 1 capsule (100 mg total) by mouth daily. 01/28/21  Yes [name], MD  ?DULoxetine (CYMBALTA) 30 MG capsule TAKE 1 CAPSULE BY MOUTH EVERY DAY 05/05/21  Yes [name], MD  ?DULoxetine (CYMBALTA) 60 MG capsule TAKE 1 CAPSULE BY MOUTH EVERY DAY 05/05/21  Yes [name], MD  ?estradiol (ESTRACE) 0.1 MG/GM vaginal cream Place 1 Applicatorful vaginally See admin instructions. Takes 2 times a week 06/28/20  Yes [provider]  ?insulin glargine (LANTUS) 100 UNIT/ML Solostar Pen Inject 5 Units into the skin in the morning.   Yes [provider]  ?levothyroxine (SYNTHROID) 50 MCG tablet TAKE 1 TABLET BY MOUTH EVERY DAY BEFORE BREAKFAST ?Patient taking differently: Take  50 mcg by mouth daily before breakfast. 03/25/21  Yes [name], MD  ?linaclotide [facility]) 72 MCG capsule Take 1 capsule (72 mcg total) by mouth daily before breakfast. 03/11/21  Yes Biagio Borg, MD  ?methenamine (MANDELAMINE) 1 g tablet Take 1,000 mg by mouth 2 (two) times daily.   Yes [provider]  ?montelukast (SINGULAIR) 10 MG tablet TAKE 1 TABLET BY MOUTH EVERY DAY ?Patient taking differently: Take 10 mg by mouth at bedtime. 02/07/21  Yes Young, Tarri Fuller D, MD  ?nitrofurantoin, macrocrystal-monohydrate, (MACROBID) 100 MG capsule Take 100 mg by mouth in the morning. 11/15/20  Yes [provider]  ?oxyCODONE-acetaminophen (PERCOCET) 10-325 MG tablet Take 1 tablet by mouth every 6 (six) hours as needed for pain.   Yes Marijo Conception, FNP  ?polyethylene glycol (MIRALAX / GLYCOLAX) 17 g packet Take 17 g by mouth 2 (two) times  daily. Decrease to daily or every other day pending on bowel movement frequency/consistency 04/14/21  Yes Mariel Aloe, MD  ?predniSONE (DELTASONE) 1 MG tablet Take 1 tablet (1 mg total) by mouth in the morning and at bedtime. ?Patient taking differently: Take 2 mg by mouth daily at 6 (six) AM. 03/31/21  Yes Kathie Dike, MD  ?rosuvastatin (CRESTOR) 10 MG tablet TAKE 1 TABLET BY MOUTH EVERY DAY IN THE EVENING 05/05/21  Yes [name], MD  ?TRELEGY ELLIPTA 100-62.5-25 MCG/INH AEPB INHALE 1 PUFF INTO THE LUNGS DAILY. RINSE MOUTH ?Patient taking differently: Inhale 1 puff into the lungs in the morning. 07/05/20  Yes Young, Tarri Fuller D, MD  ?TURMERIC PO Take 1,000 mg by mouth in the morning.   Yes [provider]  ?acetaminophen (TYLENOL) 650 MG CR tablet Take 1 tablet (650 mg total) by mouth See admin instructions. Take 1,300 mg by mouth every 4-5 hours IN CONJUNCTION WITH one Percocet 7.5/325 tablet- for pain ?Patient not taking: Reported on 04/20/2021 03/31/21   Kathie Dike, MD  ?Apoaequorin 10 MG CAPS Take 10 mg by mouth every  evening. ?Patient not taking: Reported on 04/20/2021    [provider]  ?baclofen (LIORESAL) 10 MG tablet TAKE 0.5-1 TABLET BY MOUTH TWICE DAILY AS NEEDED FOR MUSCLE SPASMS OR PAIN ?Patient not taking:

## 2021-07-25 NOTE — ED Notes (Signed)
Md ordered no 2nd trop or lactic needed. ?

## 2021-07-25 NOTE — Progress Notes (Signed)
Pharmacy Antibiotic Note ? ?Kayla Thompson is a 86 y.o. female admitted on 07/25/2021 with AMS. Patient received a dose of ceftriaxone. She has a h/o ESBL E.coli UTI.  Pharmacy has been consulted for meropenem dosing. ? ?Plan: ?Meropenem 1 g IV q12h ? ?Height: '4\' 11"'$  (149.9 cm) ?Weight: 42.2 kg (93 lb) ?IBW/kg (Calculated) : 43.2 ? ?Temp (24hrs), Avg:98.4 ?F (36.9 ?C), Min:98.2 ?F (36.8 ?C), Max:98.5 ?F (36.9 ?C) ? ?Recent Labs  ?Lab 07/25/21 ?1230  ?WBC 17.8*  ?CREATININE 0.93  ?LATICACIDVEN 1.3  ?  ?Estimated Creatinine Clearance: 30 mL/min (by C-G formula based on SCr of 0.93 mg/dL).   ? ?Allergies  ?Allergen Reactions  ? Tape Other (See Comments)  ?  SKIN IS VERY THIN AND DELICATE- will tear like paper!!  ? Lasix [Furosemide] Other (See Comments)  ?  Confusion- MD stopped this med  ? Latex Rash  ? Penicillins Rash  ?  Has patient had a PCN reaction causing immediate rash, facial/tongue/throat swelling, SOB or lightheadedness with hypotension: No ?Has patient had a PCN reaction causing severe rash involving mucus membranes or skin necrosis: No ?Has patient had a PCN reaction that required hospitalization: No ?Has patient had a PCN reaction occurring within the last 10 years: No ?If all of the above answers are "NO", then may proceed with Cephalosporin use. ?  ? ? ?Antimicrobials this admission: ?Meropenem 5/6 >> ?Ceftriaxone 5/6 x 1 ? ? ?Microbiology results: ?5/6 BCx: pending ?5/6 UCx: pending  ? ? ?Thank you for allowing pharmacy to be a part of this patient?s care. ? ?Tawnya Crook, PharmD, BCPS ?Clinical Pharmacist ?07/25/2021 6:15 PM ? ? ?

## 2021-07-25 NOTE — ED Provider Notes (Signed)
? ?Emergency Department Provider Note ? ? ?I have reviewed the triage vital signs and the nursing notes. ? ? ?HISTORY ? ?Chief Complaint ?Altered Mental Status ? ? ?HPI ?Kayla Thompson is a 86 y.o. female with past history reviewed below presents to the emergency department with her daughter and primary caregiver.  The daughter tells me that the patient has been much more confused compared to her baseline over the past 2 days.  She has not started any new medications.  She has had significant increase in weakness.  For example, this morning it took her 1 hour to get her up and out of bed and into a wheelchair to go to the car whereas typically that will take around 10 minutes.  She has been confused and unable to check her blood sugar which is normally a test you can perform.  Its been difficult for her to use her walker.  The daughter states they were seen at the urologist yesterday and urine was sent for culture but noted that he did have a funny look to it.  No fevers.  No recent falls or head trauma.  Patient has had some constipation with some streaks of blood 1 week ago but that has not recurred.  ? ? ?Past Medical History:  ?Diagnosis Date  ? Anxiety   ? Arthritis   ? Asthma   ? Cataracts, bilateral   ? removed by surgery  ? COPD (chronic obstructive pulmonary disease) ([place])   ? Depression   ? Diabetes mellitus without complication ([place])   ? type 2  ? Dyspnea   ? with exertion  ? Fibromyalgia   ? GERD (gastroesophageal reflux disease)   ? History of chemotherapy   ? History of fractured pelvis   ? Hypertension   ? Hypothyroidism   ? Osteoporosis 12/2017  ? T score -2.8 stable from prior DEXA  ? Osteoporosis   ? Ovarian cancer ([place])   ? Pneumonia   ? several times  ? ? ?Review of Systems ? ?Level 5 caveat: AMS ? ? ?____________________________________________ ? ? ?PHYSICAL EXAM: ? ?VITAL SIGNS: ?ED Triage Vitals  ?Enc Vitals Group  ?   BP 07/25/21 1144 101/64  ?   Pulse Rate 07/25/21 1144 (!) 103  ?   Resp  07/25/21 1144 17  ?   Temp 07/25/21 1144 98.5 ?F (36.9 ?C)  ?   Temp src --   ?   SpO2 07/25/21 1144 100 %  ?   Weight 07/25/21 1139 93 lb (42.2 kg)  ?   Height 07/25/21 1139 '4\' 11"'$  (1.499 m)  ? ?Constitutional: Alert and oriented. Well appearing and in no acute distress. ?Eyes: Conjunctivae are normal.  ?Head: Atraumatic. ?Nose: No congestion/rhinnorhea. ?Mouth/Throat: Mucous membranes are moist.   ?Neck: No stridor.  ?Cardiovascular: Normal rate, regular rhythm. Good peripheral circulation. Grossly normal heart sounds.   ?Respiratory: Normal respiratory effort.  No retractions. Lungs CTAB. ?Gastrointestinal: Soft and nontender. No distention.  ?Musculoskeletal: No lower extremity tenderness nor edema. No gross deformities of extremities. ?Neurologic:  Normal speech and language. No gross focal neurologic deficits are appreciated.  ?Skin:  Skin is warm, dry and intact. No rash noted. ? ? ?____________________________________________ ?  ?LABS ?(all labs ordered are listed, but only abnormal results are displayed) ? ?Labs Reviewed  ?COMPREHENSIVE METABOLIC PANEL - Abnormal; Notable for the following components:  ?    Result Value  ? Glucose, Bld 214 (*)   ? BUN 34 (*)   ?  AST 11 (*)   ? All other components within normal limits  ?LIPASE, BLOOD - Abnormal; Notable for the following components:  ? Lipase <10 (*)   ? All other components within normal limits  ?CBC WITH DIFFERENTIAL/PLATELET - Abnormal; Notable for the following components:  ? WBC 17.8 (*)   ? RBC 3.32 (*)   ? Hemoglobin 10.9 (*)   ? HCT 33.9 (*)   ? MCV 102.1 (*)   ? Neutro Abs 13.6 (*)   ? Monocytes Absolute 1.2 (*)   ? All other components within normal limits  ?URINALYSIS, ROUTINE W REFLEX MICROSCOPIC - Abnormal; Notable for the following components:  ? APPearance HAZY (*)   ? Protein, ur 100 (*)   ? Nitrite POSITIVE (*)   ? Leukocytes,Ua LARGE (*)   ? WBC, UA >50 (*)   ? Bacteria, UA RARE (*)   ? All other components within normal limits  ?CBG  MONITORING, ED - Abnormal; Notable for the following components:  ? Glucose-Capillary 220 (*)   ? All other components within normal limits  ?RESP PANEL BY RT-PCR (FLU A&B, COVID) [id number]  ?URINE CULTURE  ?CULTURE, BLOOD (ROUTINE X 2)  ?CULTURE, BLOOD (ROUTINE X 2)  ?LACTIC ACID, PLASMA  ?TROPONIN I (HIGH SENSITIVITY)  ? ?____________________________________________ ? ?EKG ? ? EKG Interpretation ? ?Date/Time:  Saturday Jul 25 2021 12:16:31 EDT ?Ventricular Rate:  97 ?PR Interval:  148 ?QRS Duration: 88 ?QT Interval:  369 ?QTC Calculation: 469 ?R Axis:   70 ?Text Interpretation: Sinus rhythm Abnormal R-wave progression, early transition Confirmed by Nanda Quinton [phone]) on 07/25/2021 12:38:31 PM ?  ? ?  ? ? ?____________________________________________ ? ?RADIOLOGY ? ?DG Chest Portable 1 View ? ?Result Date: 07/25/2021 ?CLINICAL DATA:  Shortness of breath.  Altered mental status. EXAM: PORTABLE CHEST 1 VIEW COMPARISON:  04/11/2021 and prior radiographs FINDINGS: Cardiomediastinal silhouette is unchanged. There is no evidence of focal airspace disease, pulmonary edema, suspicious pulmonary nodule/mass, pleural effusion, or pneumothorax. No acute bony abnormalities are identified. Remote rib fractures are again identified. Degenerative changes in the shoulders and thoracic spine again noted. IMPRESSION: No active disease. Electronically Signed   By: Margarette Canada M.D.   On: 07/25/2021 12:49   ? ?____________________________________________ ? ? ?PROCEDURES ? ?Procedure(s) performed:  ? ?Procedures ? ?None  ?____________________________________________ ? ? ?INITIAL IMPRESSION / ASSESSMENT AND PLAN / ED COURSE ? ?Pertinent labs & imaging results that were available during my care of the patient were reviewed by me and considered in my medical decision making (see chart for details). ?  ?This patient is Presenting for Evaluation of AMS, which does require a range of treatment options, and is a complaint that involves a high risk  of morbidity and mortality. ? ?The Differential Diagnoses includes but is not exclusive to alcohol, illicit or prescription medications, intracranial pathology such as stroke, intracerebral hemorrhage, fever or infectious causes including sepsis, hypoxemia, uremia, trauma, endocrine related disorders such as diabetes, hypoglycemia, thyroid-related diseases, etc. ? ? ?Critical Interventions-  ?  ?Medications  ?sodium chloride 0.9 % bolus 500 mL (has no administration in time range)  ?lactated ringers infusion (has no administration in time range)  ?insulin glargine-yfgn (SEMGLEE) injection 15 Units (has no administration in time range)  ?cefTRIAXone (ROCEPHIN) 1 g in sodium chloride 0.9 % 100 mL IVPB (1 g Intravenous New Bag/Given 07/25/21 1503)  ? ? ?Reassessment after intervention: Patient awake, alert and eating crackers but not at baseline per family.  ? ? ?I did obtain  Additional Historical Information from daughter at bedside, as the patient is altered. ? ?I decided to review pertinent External Data, and in summary prior urine cultures reviewed. ?  ?Clinical Laboratory Tests Ordered, included COVID and flu PCR negative.  Patient with leukocytosis above her prior levels now at 17.8.  Lactic acid is normal.  Troponin is normal.  Patient appears to have a urinary tract infection which was sent for culture.  ? ?Radiologic Tests Ordered, included CXR. I independently interpreted the images and agree with radiology interpretation.  ? ?Cardiac Monitor Tracing which shows NSR. ? ? ?Social Determinants of Health Risk patient lives at home with daughter.  ? ?Consult complete with Hospitalist. Plan for admit.  ? ?Medical Decision Making: Summary:  ?Patient presents to the emergency department for evaluation of altered mental status.  No evidence of head trauma or focal neurodeficit.  In discussion with daughter, plan to defer CT imaging of the head for now but will follow labs.  Lab work coming back showing likely urinary  tract infection with leukocytosis.  Lactic is normal.  Vital signs are not consistent with SIRS.  Low suspicion for developing sepsis.  Plan for IV Rocephin and admit.   ? ?Reevaluation with update and discu

## 2021-07-26 DIAGNOSIS — N390 Urinary tract infection, site not specified: Secondary | ICD-10-CM | POA: Diagnosis not present

## 2021-07-26 LAB — COMPREHENSIVE METABOLIC PANEL
ALT: 20 U/L (ref 0–44)
AST: 13 U/L — ABNORMAL LOW (ref 15–41)
Albumin: 3.1 g/dL — ABNORMAL LOW (ref 3.5–5.0)
Alkaline Phosphatase: 43 U/L (ref 38–126)
Anion gap: 9 (ref 5–15)
BUN: 26 mg/dL — ABNORMAL HIGH (ref 8–23)
CO2: 24 mmol/L (ref 22–32)
Calcium: 8.9 mg/dL (ref 8.9–10.3)
Chloride: 100 mmol/L (ref 98–111)
Creatinine, Ser: 0.8 mg/dL (ref 0.44–1.00)
GFR, Estimated: >60 mL/min (ref >60)
Glucose, Bld: 164 mg/dL — ABNORMAL HIGH (ref 70–99)
Potassium: 3.6 mmol/L (ref 3.5–5.1)
Sodium: 133 mmol/L — ABNORMAL LOW (ref 135–145)
Total Bilirubin: 0.5 mg/dL (ref 0.3–1.2)
Total Protein: 5.9 g/dL — ABNORMAL LOW (ref 6.5–8.1)

## 2021-07-26 LAB — CBC
HCT: 30 % — ABNORMAL LOW (ref 36.0–46.0)
Hemoglobin: 10.2 g/dL — ABNORMAL LOW (ref 12.0–15.0)
MCH: 34.3 pg — ABNORMAL HIGH (ref 26.0–34.0)
MCHC: 34 g/dL (ref 30.0–36.0)
MCV: 101 fL — ABNORMAL HIGH (ref 80.0–100.0)
Platelets: 198 10*3/uL (ref 150–400)
RBC: 2.97 MIL/uL — ABNORMAL LOW (ref 3.87–5.11)
RDW: 12.7 % (ref 11.5–15.5)
WBC: 12.7 10*3/uL — ABNORMAL HIGH (ref 4.0–10.5)
nRBC: 0 % (ref 0.0–0.2)

## 2021-07-26 LAB — GLUCOSE, CAPILLARY
Glucose-Capillary: 176 mg/dL — ABNORMAL HIGH (ref 70–99)
Glucose-Capillary: 122 mg/dL — ABNORMAL HIGH (ref 70–99)
Glucose-Capillary: 184 mg/dL — ABNORMAL HIGH (ref 70–99)
Glucose-Capillary: 145 mg/dL — ABNORMAL HIGH (ref 70–99)

## 2021-07-26 MED ORDER — OXYCODONE-ACETAMINOPHEN 7.5-325 MG PO TABS
1.0000 | ORAL_TABLET | Freq: Four times a day (QID) | ORAL | Status: DC | PRN
  Administered 2021-07-28: 1 via ORAL
  Filled 2021-07-26: qty 1

## 2021-07-26 MED ORDER — SODIUM CHLORIDE 0.9 % IV SOLN: INTRAVENOUS | Status: DC

## 2021-07-26 MED ORDER — GERHARDT'S BUTT CREAM
TOPICAL_CREAM | Freq: Two times a day (BID) | CUTANEOUS | Status: DC
  Filled 2021-07-26: qty 1

## 2021-07-26 NOTE — Progress Notes (Signed)
?PROGRESS NOTE ? ? ?Kayla Thompson  XBL:[id number] DOB: 10/03/1935 DOA: 07/25/2021 ?PCP: [name], MD  ?Brief Narrative:  ?86 year old white female home dwelling known polymyalgia rheumatica/rheumatoid on chronic prednisone (follows with Dr. Kathlene November) with chronically elevated white count ?DM TY 2 ?HFpEF ?Macrocytic anemia ?Asthma/COPD overlap on intermittent oxygen ?Ovarian cancer status post TAH/BSO ?Chronic left shoulder issues followed by [facility] ?COVID infection and recovered 03/31/2021-at that admission also had actinomyces bacteremia Rx doxycycline ?Frequent UTIs ?She had ESBL infection at that time in addition to possible yeast and fecal impaction-patient was discharged home on nitrofurantoin doxycycline fluconazole additionally ?  ?Lives alone at home is followed by palliative care and has caregivers 24/7 ?Found to be encephalopathic compared to her baseline over 48 hours no new medications-unable to perform usual tasks-went to urologist apparently 5/6 and urine was sent for culture ?  ?Daughter augments h/o--she couldn't do basic things--didn't know what to do with the glucose pen [normally can] ?Regularly constipated--had some bleeding on last hard stool ? ?Hospital-Problem based course ? ?Probable underlying UTI--growing 40,000 colony-forming units ?History of MDR organisms confounded by chronically elevated white count as is on chronic steroids ?Continue meropenem ?-await culture prior to adjustment ?Saline 100 cc/H ?Awake enough to have diet ?Rheumatoid on chronic prednisone 2 mg follows Dr. Kathlene November ?As is not hypotensive no need for stress dosing continue 2 mg dosing ?Outpatient follow-up ?Asthma/COPD on intermittent oxygen ?No wheeze continue standard inhalers ?Ovarian cancer status post TAH/BSO ?Follow updated guidelines and outpatient follow-up with her oncologist ?Dm ty 2 ?CBGs 1 20-1 80-continue SSI, lantus 5 ?Constipation ?continue Linzess and will need to continue her oxy for  her chronic pain issues ?Chronic shoulder pain ?Continue oxy-monitor mentation, use Cymbalta 30 for the depression and pain control but lower dose--will not be dosing at 90 mg dosing ?Vitamin D deficiency-hold supplements at this time ?Hyperlipidemia-hold Crestor minimize meds ?Attempt to reduce polypharmacy hold Remeron at this time ? ? ?DVT prophylaxis: Heparin ?Code Status: DNR ?Family Communication: d/w daughter on admit ?Disposition:  ?Status is: Inpatient ?Remains inpatient appropriate because: Has infection and not ready for discharge needs to narrow antibiotics prior to decision ?  ?Consultants:  ? ? ?Procedures:  ? ?Antimicrobials: Meropenem ? ? ?Subjective: ?Well ?Much more coherent ?No distress ?Can tell me time place but gets confused about year ?Ate some soup --not much ?She seems closer to what her baseline might be ? ? ?Objective: ?Vitals:  ? 07/25/21 2135 07/26/21 0129 07/26/21 0524 07/26/21 0757  ?BP: 116/64 118/60 129/62   ?Pulse: 84 87 87   ?Resp: '17 17 18   '$ ?Temp: 98.2 ?F (36.8 ?C) 98.4 ?F (36.9 ?C) 98.6 ?F (37 ?C)   ?TempSrc: Oral Oral Oral   ?SpO2: 99% 100% 97% 97%  ?Weight:      ?Height:      ? ? ?Intake/Output Summary (Last 24 hours) at 07/26/2021 1315 ?Last data filed at 07/26/2021 1139 ?Gross per 24 hour  ?Intake 894.54 ml  ?Output 1200 ml  ?Net -305.46 ml  ? ?Filed Weights  ? 07/25/21 1139  ?Weight: 42.2 kg  ? ? ?Examination: ? ?EOMI NCAT no focal deficit ?Neck soft supple ?Chest clear no rales rhonchi wheeze ?ROM intact ?Abdomen is soft ?neurologically intact ? ?Data Reviewed: personally reviewed  ? ?CBC ?   ?Component Value Date/Time  ? WBC 12.7 (H) 07/26/2021 0728  ? RBC 2.97 (L) 07/26/2021 0728  ? HGB 10.2 (L) 07/26/2021 0728  ? HGB 11.7 11/28/2017 1439  ? HCT  30.0 (L) 07/26/2021 0728  ? HCT 36.9 11/28/2017 1438  ? PLT 198 07/26/2021 0728  ? PLT 304 11/28/2017 1439  ? MCV 101.0 (H) 07/26/2021 0728  ? MCH 34.3 (H) 07/26/2021 1443  ? MCHC 34.0 07/26/2021 0728  ? RDW 12.7 07/26/2021 0728  ?  LYMPHSABS 2.7 07/25/2021 1230  ? MONOABS 1.2 (H) 07/25/2021 1230  ? EOSABS 0.3 07/25/2021 1230  ? BASOSABS 0.1 07/25/2021 1230  ? ? ?  Latest Ref Rng & Units 07/26/2021  ?  7:28 AM 07/25/2021  ? 12:30 PM 04/20/2021  ?  3:43 PM  ?CMP  ?Glucose 70 - 99 mg/dL 164   214   144    ?BUN 8 - 23 mg/dL 26   34   28    ?Creatinine 0.44 - 1.00 mg/dL 0.80   0.93   0.98    ?Sodium 135 - 145 mmol/L 133   135   136    ?Potassium 3.5 - 5.1 mmol/L 3.6   4.1   4.6    ?Chloride 98 - 111 mmol/L 100   100   101    ?CO2 22 - 32 mmol/L '24   26   23    '$ ?Calcium 8.9 - 10.3 mg/dL 8.9   9.8   10.1    ?Total Protein 6.5 - 8.1 g/dL 5.9   6.7   6.7    ?Total Bilirubin 0.3 - 1.2 mg/dL 0.5   0.5   0.3    ?Alkaline Phos 38 - 126 U/L 43   42   46    ?AST 15 - 41 U/L '13   11   16    '$ ?ALT 0 - 44 U/L '20   23   18    '$ ? ? ? ?Radiology Studies: ?DG Chest Portable 1 View ? ?Result Date: 07/25/2021 ?CLINICAL DATA:  Shortness of breath.  Altered mental status. EXAM: PORTABLE CHEST 1 VIEW COMPARISON:  04/11/2021 and prior radiographs FINDINGS: Cardiomediastinal silhouette is unchanged. There is no evidence of focal airspace disease, pulmonary edema, suspicious pulmonary nodule/mass, pleural effusion, or pneumothorax. No acute bony abnormalities are identified. Remote rib fractures are again identified. Degenerative changes in the shoulders and thoracic spine again noted. IMPRESSION: No active disease. Electronically Signed   By: Margarette Canada M.D.   On: 07/25/2021 12:49   ? ? ?Scheduled Meds: ? dextromethorphan-guaiFENesin  2 tablet Oral BID  ? DULoxetine  30 mg Oral Daily  ? [START ON 07/28/2021] estradiol  1 Applicatorful Vaginal Once per day on Tue Fri  ? feeding supplement  237 mL Oral TID WC  ? fluticasone furoate-vilanterol  1 puff Inhalation Daily  ? And  ? umeclidinium bromide  1 puff Inhalation Daily  ? Gerhardt's butt cream   Topical BID  ? heparin  5,000 Units Subcutaneous Q8H  ? insulin aspart  0-9 Units Subcutaneous TID WC  ? insulin aspart  2 Units  Subcutaneous TID WC  ? insulin glargine-yfgn  5 Units Subcutaneous Q breakfast  ? levothyroxine  50 mcg Oral QAC breakfast  ? linaclotide  72 mcg Oral QAC breakfast  ? pantoprazole  40 mg Oral Daily  ? polyethylene glycol  17 g Oral BID  ? predniSONE  2 mg Oral Q breakfast  ? [START ON 07/30/2021] vitamin B-12  2,500 mcg Oral Q Thu  ? ?Continuous Infusions: ? sodium chloride 100 mL/hr at 07/26/21 1114  ? meropenem (MERREM) IV 1 g (07/26/21 1115)  ? ? ? LOS: 1 day  ? ?  Time spent: 83 ? ?Nita Sells, MD ?[facility] ?To contact the attending provider between 7A-7P or the covering provider during after hours 7P-7A, please log into the web site [email or web address] and access using universal [place] password for that web site. If you do not have the password, please call the hospital operator. ? ?07/26/2021, 1:15 PM  ? ? ?

## 2021-07-27 DIAGNOSIS — G9341 Metabolic encephalopathy: Secondary | ICD-10-CM

## 2021-07-27 DIAGNOSIS — N390 Urinary tract infection, site not specified: Secondary | ICD-10-CM | POA: Diagnosis not present

## 2021-07-27 LAB — CBC
HCT: 32 % — ABNORMAL LOW (ref 36.0–46.0)
Hemoglobin: 10.6 g/dL — ABNORMAL LOW (ref 12.0–15.0)
MCH: 33.9 pg (ref 26.0–34.0)
MCHC: 33.1 g/dL (ref 30.0–36.0)
MCV: 102.2 fL — ABNORMAL HIGH (ref 80.0–100.0)
Platelets: 207 10*3/uL (ref 150–400)
RBC: 3.13 MIL/uL — ABNORMAL LOW (ref 3.87–5.11)
RDW: 12.7 % (ref 11.5–15.5)
WBC: 10.6 10*3/uL — ABNORMAL HIGH (ref 4.0–10.5)
nRBC: 0 % (ref 0.0–0.2)

## 2021-07-27 LAB — GLUCOSE, CAPILLARY
Glucose-Capillary: 198 mg/dL — ABNORMAL HIGH (ref 70–99)
Glucose-Capillary: 246 mg/dL — ABNORMAL HIGH (ref 70–99)
Glucose-Capillary: 180 mg/dL — ABNORMAL HIGH (ref 70–99)
Glucose-Capillary: 199 mg/dL — ABNORMAL HIGH (ref 70–99)

## 2021-07-27 LAB — URINE CULTURE: Culture: 40000 — AB

## 2021-07-27 LAB — RENAL FUNCTION PANEL
Albumin: 3 g/dL — ABNORMAL LOW (ref 3.5–5.0)
Anion gap: 9 (ref 5–15)
BUN: 17 mg/dL (ref 8–23)
CO2: 24 mmol/L (ref 22–32)
Calcium: 9 mg/dL (ref 8.9–10.3)
Chloride: 105 mmol/L (ref 98–111)
Creatinine, Ser: 0.82 mg/dL (ref 0.44–1.00)
GFR, Estimated: >60 mL/min (ref >60)
Glucose, Bld: 153 mg/dL — ABNORMAL HIGH (ref 70–99)
Phosphorus: 3.1 mg/dL (ref 2.5–4.6)
Potassium: 3.5 mmol/L (ref 3.5–5.1)
Sodium: 138 mmol/L (ref 135–145)

## 2021-07-27 NOTE — Evaluation (Signed)
Physical Therapy Evaluation ?Patient Details ?Name: Kayla Thompson ?MRN: [id number] ?DOB: 03-29-1935 ?Today's Date: 07/27/2021 ? ?History of Present Illness ? Patient is 86 y.o. female presenting to [facility] with AMS, suspsected UTI.   PMH: asthma, COPD on intermittent O2, RA, anxiety & depression, DMII, GERD, HTN, CHF, ovarian cancer, hypotyroidism, chroinic UTis and PNA, fibromyalgia.  ?Clinical Impression ? Pt present with the problems listed above and functional impairments listed below. Pt Aox3; reports she lives with her daughter, utilizes a RW for household ambulation, and has a home aide M-Sat 8 hrs/day for general housework and some bathing ADLs. Pt was mod assist for transfers, min assist for bed mobility, and min guard for in-room ambulation with RW of ~20f. Pt demonstrated significantly decreased shoulder ROM bilaterally with 9/10 pain and creptius with functional movement; BLE strength and ROM functional but decreased. Pt had episode of fecal incontinence while ambulating and was able to report knowledge of the event. Discussed SNF-level therapies upon discharge and pt is not currently agreeable despite elucidation of current functional limitations and burden of care that is upon her daughter; if pt continues to refuse would benefit from HHuslia She will continue to benefit from skilled acute therapy and we will follow her acutely.   ?   ? ?Recommendations for follow up therapy are one component of a multi-disciplinary discharge planning process, led by the attending physician.  Recommendations may be updated based on patient status, additional functional criteria and insurance authorization. ? ?Follow Up Recommendations Skilled nursing-short term rehab (<3 hours/day) (Pt likely to refuse SNF, if she does then will need HHPT (reports she already has set up for 1x/weekly)) ? ?  ?Assistance Recommended at Discharge Frequent or constant Supervision/Assistance  ?Patient can return home with the  following ? A little help with walking and/or transfers;A little help with bathing/dressing/bathroom;Assistance with cooking/housework;Direct supervision/assist for medications management;Direct supervision/assist for financial management;Assist for transportation;Help with stairs or ramp for entrance ? ?  ?Equipment Recommendations None recommended by PT (pt has recommended DME)  ?Recommendations for Other Services ?    ?  ?Functional Status Assessment Patient has had a recent decline in their functional status and demonstrates the ability to make significant improvements in function in a reasonable and predictable amount of time.  ? ?  ?Precautions / Restrictions Precautions ?Precautions: Fall  ? ?  ? ?Mobility ? Bed Mobility ?Overal bed mobility: Needs Assistance ?Bed Mobility: Sit to Supine ?  ?  ?  ?Sit to supine: Min assist ?  ?General bed mobility comments: Pt requires min assist for bringing BLE into bed, able to adjust positioning in bed without assistance ?  ? ?Transfers ?Overall transfer level: Needs assistance ?Equipment used: Rolling walker (2 wheels) ?Transfers: Sit to/from Stand ?Sit to Stand: Mod assist ?  ?  ?  ?  ?  ?General transfer comment: Pt required mod assist for lift assist and steadying of RW during standing from recliner. Upon standing, pt demonstrated strong posterior lean against recliner surface but was able to correct with cuing. ?  ? ?Ambulation/Gait ?Ambulation/Gait assistance: Min guard, Supervision ?Gait Distance (Feet): 20 Feet ?Assistive device: Rolling walker (2 wheels) ?Gait Pattern/deviations: Step-to pattern, Decreased stride length, Decreased dorsiflexion - right, Decreased dorsiflexion - left, Shuffle, Trunk flexed, Narrow base of support ?Gait velocity: decreased ?  ?  ?General Gait Details: Pt ambulated with RW and min guard assist ~240fin room without overt LOB. Demonstrated decreased stride length, DF, and narrow BOS; VCs for proximity to  device. Pt was able to safely  take several backwards and side-steps with RW and supervision. During gait, pt experienced fecal incontinence but was safe to return to supine, RN notified. ? ?Stairs ?  ?  ?  ?  ?  ? ?Wheelchair Mobility ?  ? ?Modified Rankin (Stroke Patients Only) ?  ? ?  ? ?Balance Overall balance assessment: Needs assistance ?Sitting-balance support: Feet supported, No upper extremity supported ?Sitting balance-Leahy Scale: Fair ?  ?Postural control: Posterior lean ?Standing balance support: Reliant on assistive device for balance, Bilateral upper extremity supported, During functional activity ?Standing balance-Leahy Scale: Poor ?  ?  ?  ?  ?  ?  ?  ?  ?  ?  ?  ?  ?   ? ? ? ?Pertinent Vitals/Pain Pain Assessment ?Pain Assessment: 0-10 ?Pain Score: 9  ?Pain Location: B/L shoulders, L>R ?Pain Descriptors / Indicators: Discomfort ?Pain Intervention(s): Limited activity within patient's tolerance, Monitored during session, Repositioned  ? ? ?Home Living Family/patient expects to be discharged to:: Private residence ?Living Arrangements: Children (Daughter) ?Available Help at Discharge: Family;Personal care attendant;Available 24 hours/day (Aide for housework M-Sat 8hours/day, some ADLs) ?Type of Home: House ?Home Access: Stairs to enter ?Entrance Stairs-Rails: None ?Entrance Stairs-Number of Steps: 1 full step to a patio, then threshold ?  ?Home Layout: One level ?Home Equipment: Conservation officer, nature (2 wheels) ?Additional Comments: portions of home environment provided from encounter 04/13/21  ?  ?Prior Function Prior Level of Function : Needs assist ? Cognitive Assist : Mobility (cognitive);ADLs (cognitive) ?Mobility (Cognitive): Intermittent cues ?ADLs (Cognitive): Intermittent cues ?Physical Assist : ADLs (physical) ?  ?ADLs (physical): Bathing;Dressing;IADLs ?Mobility Comments: Pt uses RW in house for short distance ?ADLs Comments: patient was able to complete toileting tasks with caregivers present. patient does not complete bathing  or dressing tasks herself. bilateral shoulder ROM issues impact participation in all tasks. ?  ? ? ?Hand Dominance  ?   ? ?  ?Extremity/Trunk Assessment  ? Upper Extremity Assessment ?Upper Extremity Assessment: RUE deficits/detail;LUE deficits/detail;Generalized weakness ?RUE Deficits / Details: Unable to move Rshoulder above ~45deg flexion, functional elbow flexion ?LUE Deficits / Details: Able to functionally grip RW at ~30deg shoulder flexion but during ROM testing even less motion; during functional activities shoulder producing a large amount of crepitus. ?  ? ?Lower Extremity Assessment ?Lower Extremity Assessment: Generalized weakness;RLE deficits/detail;LLE deficits/detail ?RLE Deficits / Details: Reduced ank df ROM, knee flexion ROM ?LLE Deficits / Details: Reduced ank DF rom, knee flexion rom ?  ? ?Cervical / Trunk Assessment ?Cervical / Trunk Assessment: Kyphotic  ?Communication  ? Communication: HOH  ?Cognition Arousal/Alertness: Awake/alert ?Behavior During Therapy: [facility] for tasks assessed/performed ?Overall Cognitive Status: No family/caregiver present to determine baseline cognitive functioning ?  ?  ?  ?  ?  ?  ?  ?  ?  ?  ?  ?  ?  ?  ?  ?  ?General Comments: Pt AOx4, originally reported she was at [facility] but was able to correct. ?  ?  ? ?  ?General Comments   ? ?  ?Exercises General Exercises - Lower Extremity ?Ankle Circles/Pumps: AROM, Both, 5 reps ?Heel Slides: AROM, Both, 10 reps ?Hip Flexion/Marching: AROM, Both, 10 reps  ? ?Assessment/Plan  ?  ?PT Assessment Patient needs continued PT services  ?PT Problem List Decreased strength;Decreased range of motion;Decreased activity tolerance;Decreased balance;Decreased mobility;Decreased coordination;Decreased cognition;Decreased knowledge of use of DME;Decreased safety awareness;Pain ? ?   ?  ?PT Treatment Interventions  DME instruction;Gait training;Stair training;Functional mobility training;Therapeutic activities;Therapeutic  exercise;Balance training;Neuromuscular re-education;Patient/family education   ? ?PT Goals (Current goals can be found in the Care Plan section)  ?Acute Rehab PT Goals ?Patient Stated Goal: to go home ?PT Goal Formulat

## 2021-07-27 NOTE — Progress Notes (Signed)
?PROGRESS NOTE ? ? ? ?Kayla Thompson  ZWC:[id number] DOB: October 24, 1935 DOA: 07/25/2021 ?PCP: [name], MD  ? ?Brief Narrative:  ?86 year old female with history of polymyalgia rheumatica/rheumatoid arthritis on chronic prednisone with chronically elevated white count, diabetes mellitus type 2, chronic diastolic heart failure, macrocytic anemia, asthma/COPD with chronic hypoxic respiratory failure requiring intermittent oxygen, ovarian cancer status post TAH/BSO, chronic left shoulder issues followed by [facility], COVID-19 infection in January 2023, actinomyces bacteremia requiring treatment with doxycycline, frequent UTIs presented with increasing confusion.  She was admitted for possible MDR UTI and started on meropenem. ? ?Assessment & Plan: ?  ?Possible UTI ?History of MDR UTI in the past ?-Currently on meropenem.  Urine cultures growing E. coli.  Follow sensitivities. ? ?Acute metabolic encephalopathy ?-Possibly from above.  Still slow to respond.  Monitor.  Fall precautions. ?-Remeron on hold ? ?Leukocytosis ?-Resolved ? ?Rheumatoid arthritis/polymyalgia rheumatica ?-On chronic prednisone as an outpatient.  Follows up with rheumatology/Dr. Kathlene November ?-Continue home prednisone dose.  Outpatient follow-up ? ?Asthma/COPD ?Chronic respiratory failure with hypoxia on intermittent oxygen at home ?-Respite status currently stable.  Continue current inhaled regimen ? ?History of ovarian cancer status post TAHBSO ?-Outpatient follow-up with oncology ? ?Diabetes mellitus type 2 with hyperglycemia ?-Continue Lantus along with NovoLog with meals and CBGs with SSI ? ?Hyponatremia ?-Resolved ? ?Constipation--continue Linzess. ? ?Hyperlipidemia--Crestor on hold ? ?Vitamin D deficiency ?-Hold supplements at this time ? ?Generalized deconditioning ?-PT eval ? ? ?DVT prophylaxis: Heparin ?Code Status:DNR ?Family Communication: None at bedside ?Disposition Plan: ?Status is: Inpatient ?Remains inpatient appropriate  because: Of need for IV antibiotics.  PT eval ? ? ? ?Consultants: None ? ?Procedures: None ? ?Antimicrobials: Meropenem from 07/25/2021 ? ? ?Subjective: ?Patient seen and examined at bedside.  Still slow to respond but answers some questions appropriately.  No overnight fever, nausea, vomiting reported. ? ?Objective: ?Vitals:  ? 07/26/21 0757 07/26/21 1529 07/26/21 2208 07/27/21 0520  ?BP:  137/84 (!) 144/78 (!) 149/78  ?Pulse:  91 87 74  ?Resp:  '17 17 17  '$ ?Temp:  (!) 97.5 ?F (36.4 ?C) 98.3 ?F (36.8 ?C) 97.6 ?F (36.4 ?C)  ?TempSrc:  Oral Oral Oral  ?SpO2: 97% 96% 99% 100%  ?Weight:      ?Height:      ? ? ?Intake/Output Summary (Last 24 hours) at 07/27/2021 1153 ?Last data filed at 07/27/2021 1043 ?Gross per 24 hour  ?Intake 2460.8 ml  ?Output --  ?Net 2460.8 ml  ? ?Filed Weights  ? 07/25/21 1139  ?Weight: 42.2 kg  ? ? ?Examination: ? ?General exam: Appears calm and comfortable.  Elderly female sitting on chair.  Currently on room air. ?Respiratory system: Bilateral decreased breath sounds at bases ?Cardiovascular system: S1 & S2 heard, Rate controlled ?Gastrointestinal system: Abdomen is nondistended, soft and nontender. Normal bowel sounds heard. ?Extremities: No cyanosis, clubbing, edema  ?Central nervous system: Awake, slow to respond, intermittently confused.  Answers some questions appropriately.  No focal neurological deficits. Moving extremities ?Skin: No rashes, lesions or ulcers ?Psychiatry: Affect is mostly flat.  No signs of agitation. ? ? ?Data Reviewed: I have personally reviewed following labs and imaging studies ? ?CBC: ?Recent Labs  ?Lab 07/25/21 ?1230 07/26/21 ?0728 07/27/21 ?0500  ?WBC 17.8* 12.7* 10.6*  ?NEUTROABS 13.6*  --   --   ?HGB 10.9* 10.2* 10.6*  ?HCT 33.9* 30.0* 32.0*  ?MCV 102.1* 101.0* 102.2*  ?PLT 264 198 207  ? ?Basic Metabolic Panel: ?Recent Labs  ?Lab 07/25/21 ?1230 07/26/21 ?2353  07/27/21 ?0500  ?NA 135 133* 138  ?K 4.1 3.6 3.5  ?CL 100 100 105  ?CO2 '26 24 24  '$ ?GLUCOSE 214* 164* 153*   ?BUN 34* 26* 17  ?CREATININE 0.93 0.80 0.82  ?CALCIUM 9.8 8.9 9.0  ?PHOS  --   --  3.1  ? ?GFR: ?Estimated Creatinine Clearance: 34 mL/min (by C-G formula based on SCr of 0.82 mg/dL). ?Liver Function Tests: ?Recent Labs  ?Lab 07/25/21 ?1230 07/26/21 ?0728 07/27/21 ?0500  ?AST 11* 13*  --   ?ALT 23 20  --   ?ALKPHOS 42 43  --   ?BILITOT 0.5 0.5  --   ?PROT 6.7 5.9*  --   ?ALBUMIN 4.0 3.1* 3.0*  ? ?Recent Labs  ?Lab 07/25/21 ?1230  ?LIPASE <10*  ? ?No results for input(s): AMMONIA in the last 168 hours. ?Coagulation Profile: ?No results for input(s): INR, PROTIME in the last 168 hours. ?Cardiac Enzymes: ?No results for input(s): CKTOTAL, CKMB, CKMBINDEX, TROPONINI in the last 168 hours. ?BNP (last 3 results) ?Recent Labs  ?  11/26/20 ?1339  ?PROBNP 291  ? ?HbA1C: ?No results for input(s): HGBA1C in the last 72 hours. ?CBG: ?Recent Labs  ?Lab 07/26/21 ?1234 07/26/21 ?1634 07/26/21 ?2210 07/27/21 ?0756 07/27/21 ?1134  ?GLUCAP 122* 184* 145* 198* 246*  ? ?Lipid Profile: ?No results for input(s): CHOL, HDL, LDLCALC, TRIG, CHOLHDL, LDLDIRECT in the last 72 hours. ?Thyroid Function Tests: ?No results for input(s): TSH, T4TOTAL, FREET4, T3FREE, THYROIDAB in the last 72 hours. ?Anemia Panel: ?No results for input(s): VITAMINB12, FOLATE, FERRITIN, TIBC, IRON, RETICCTPCT in the last 72 hours. ?Sepsis Labs: ?Recent Labs  ?Lab 07/25/21 ?1230  ?LATICACIDVEN 1.3  ? ? ?Recent Results (from the past 240 hour(s))  ?Resp Panel by RT-PCR (Flu A&B, Covid) Nasopharyngeal Swab     Status: None  ? Collection Time: 07/25/21 12:30 PM  ? Specimen: Nasopharyngeal Swab; Nasopharyngeal(NP) swabs in vial transport medium  ?Result Value Ref Range Status  ? SARS Coronavirus 2 by RT PCR NEGATIVE NEGATIVE Final  ?  Comment: (NOTE) ?SARS-CoV-2 target nucleic acids are NOT DETECTED. ? ?The SARS-CoV-2 RNA is generally detectable in upper respiratory ?specimens during the acute phase of infection. The lowest ?concentration of SARS-CoV-2 viral copies this  assay can detect is ?138 copies/mL. A negative result does not preclude SARS-Cov-2 ?infection and should not be used as the sole basis for treatment or ?other patient management decisions. A negative result may occur with  ?improper specimen collection/handling, submission of specimen other ?than nasopharyngeal swab, presence of viral mutation(s) within the ?areas targeted by this assay, and inadequate number of viral ?copies(<138 copies/mL). A negative result must be combined with ?clinical observations, patient history, and epidemiological ?information. The expected result is Negative. ? ?Fact Sheet for Patients:  ?EntrepreneurPulse.com.au ? ?Fact Sheet for Healthcare Providers:  ?IncredibleEmployment.be ? ?This test is no t yet approved or cleared by the [place] FDA and  ?has been authorized for detection and/or diagnosis of SARS-CoV-2 by ?FDA under an Emergency Use Authorization (EUA). This EUA will remain  ?in effect (meaning this test can be used) for the duration of the ?COVID-19 declaration under Section 564(b)(1) of the Act, 21 ?U.S.C.section 360bbb-3(b)(1), unless the authorization is terminated  ?or revoked sooner.  ? ? ?  ? Influenza A by PCR NEGATIVE NEGATIVE Final  ? Influenza B by PCR NEGATIVE NEGATIVE Final  ?  Comment: (NOTE) ?The Xpert Xpress SARS-CoV-2/FLU/RSV plus assay is intended as an aid ?in the diagnosis of influenza from  Nasopharyngeal swab specimens and ?should not be used as a sole basis for treatment. Nasal washings and ?aspirates are unacceptable for Xpert Xpress SARS-CoV-2/FLU/RSV ?testing. ? ?Fact Sheet for Patients: ?EntrepreneurPulse.com.au ? ?Fact Sheet for Healthcare Providers: ?IncredibleEmployment.be ? ?This test is not yet approved or cleared by the [place] FDA and ?has been authorized for detection and/or diagnosis of SARS-CoV-2 by ?FDA under an Emergency Use Authorization (EUA). This EUA will  remain ?in effect (meaning this test can be used) for the duration of the ?COVID-19 declaration under Section 564(b)(1) of the Act, 21 U.S.C. ?section 360bbb-3(b)(1), unless the authorization is terminat

## 2021-07-27 NOTE — TOC Initial Note (Signed)
Transition of Care (TOC) - Initial/Assessment Note  ? ? ?Patient Details  ?Name: Kayla Thompson ?MRN: [id number] ?Date of Birth: 05-May-1935 ? ?Transition of Care (TOC) CM/SW Contact:    ?Munachimso Rigdon, Marjie Skiff, RN ?Phone Number: ?07/27/2021, 3:04 PM ? ?Clinical Narrative:                 ?Spoke with pt and daughter Kayla Thompson at the bedside for dc planning. Pt states that she would like to go home at dc. She is confused at this time. Daughter Kayla Thompson states that pt has caregivers at home 8 hours a day and her other daughter stays with pt in the evening. Kayla Thompson says she will talk with her sister about SNF and TOC will follow back up with them tomorrow. ? ?Expected Discharge Plan: [place] ?Barriers to Discharge: Continued Medical Work up ? ? ?Patient Goals and CMS Choice ?Patient states their goals for this hospitalization and ongoing recovery are:: To go home ?  ?  ? ?Expected Discharge Plan and Services ?Expected Discharge Plan: [place] ?  ?Discharge Planning Services: CM Consult ?  ?Living arrangements for the past 2 months: [place] ?                ?  ?Prior Living Arrangements/Services ?Living arrangements for the past 2 months: [place] ?Lives with:: Self, Adult Children, Other (Comment) (caregiver) ?Patient language and need for interpreter reviewed:: Yes ?Do you feel safe going back to the place where you live?: Yes      ?Need for Family Participation in Patient Care: Yes (Comment) ?Care giver support system in place?: Yes (comment) ?Current home services: [facility], Home PT ?Criminal Activity/Legal Involvement Pertinent to Current Situation/Hospitalization: No - Comment as needed ? ?Activities of Daily Living ?Home Assistive Devices/Equipment: Gilford Rile (specify type) ?ADL Screening (condition at time of admission) ?Patient's cognitive ability adequate to safely complete daily activities?: No ?Is the patient deaf or have difficulty hearing?: Yes ?Does the patient have  difficulty seeing, even when wearing glasses/contacts?: No ?Does the patient have difficulty concentrating, remembering, or making decisions?: Yes ?Patient able to express need for assistance with ADLs?: Yes ?Does the patient have difficulty dressing or bathing?: Yes ?Independently performs ADLs?: No ?Communication: Independent ?Dressing (OT): Needs assistance ?Is this a change from baseline?: Pre-admission baseline ?Grooming: Needs assistance ?Is this a change from baseline?: Pre-admission baseline ?Feeding: Needs assistance ?Is this a change from baseline?: Pre-admission baseline ?Bathing: Needs assistance ?Is this a change from baseline?: Pre-admission baseline ?Toileting: Needs assistance ?Is this a change from baseline?: Pre-admission baseline ?In/Out Bed: Needs assistance ?Is this a change from baseline?: Pre-admission baseline ?Walks in Home: Needs assistance ?Is this a change from baseline?: Pre-admission baseline ?Does the patient have difficulty walking or climbing stairs?: Yes ?Weakness of Legs: Both ?Weakness of Arms/Hands: Both ? ?Permission Sought/Granted ?Permission sought to share information with : Customer service manager ?Permission granted to share information with : Yes, Verbal Permission Granted ?   ? Permission granted to share info w AGENCY: Nanine Means ?   ?   ? ?Emotional Assessment ?Appearance:: Appears stated age ?Attitude/Demeanor/Rapport: Gracious ?Affect (typically observed): Calm ?Orientation: : Oriented to Self, Oriented to Place ?Alcohol / Substance Use: Not Applicable ?Psych Involvement: No (comment) ? ?Admission diagnosis:  Encephalopathy [G93.40] ?Acute cystitis without hematuria [N30.00] ?Acute metabolic encephalopathy [Y40.34] ?Complicated urinary tract infection [N39.0] ?Patient Active Problem List  ? Diagnosis Date Noted  ? Acute metabolic encephalopathy 74/25/9563  ?  Complicated urinary tract infection 07/25/2021  ? Vulvovaginal candidiasis 06/27/2021  ? Palliative care  encounter 04/26/2021  ? Fecal impaction ([place]) 04/12/2021  ? Hypokalemia 04/12/2021  ? Positive blood culture 04/03/2021  ? Constipation 03/11/2021  ? Weight loss 03/11/2021  ? Chronic obstructive pulmonary disease ([place]) 11/20/2020  ? Other chronic pain   ? Hypertension associated with diabetes ([place]) 05/07/2020  ? Hyperlipidemia associated with type 2 diabetes mellitus ([place]) 05/07/2020  ? Depression 05/07/2020  ? PAD (peripheral artery disease) ([place]) 02/05/2020  ? Upper esophageal web 03/27/2019  ? Iron deficiency anemia 03/27/2019  ? Candida esophagitis ([place]) 02/14/2019  ? UTI (urinary tract infection) 01/25/2019  ? Yeast cystitis 01/25/2019  ? Encounter for general adult medical examination with abnormal findings 01/09/2019  ? History of colonic polyps 12/28/2018  ? Dysphagia 12/28/2018  ? Diabetic foot ulcer ([place]) 12/05/2018  ? Rotator cuff arthropathy, left 11/16/2018  ? Degenerative arthritis of left knee 11/16/2018  ? Insomnia 09/26/2018  ? Leukocytosis 10/17/2017  ? Severe protein-calorie malnutrition ([place]) 10/10/2017  ? Memory changes 07/29/2017  ? Granulomatous lung disease ([place]) 06/14/2017  ? Tracheobronchomalacia 06/14/2017  ? Dizziness 03/04/2017  ? Exercise hypoxemia 07/12/2016  ? Chronic heart failure with preserved ejection fraction ([place]) 12/25/2015  ? GERD (gastroesophageal reflux disease) 12/25/2015  ? Epidermal inclusion cyst 09/02/2015  ? Muscle cramps 03/06/2015  ? Cough 01/07/2015  ? Bronchiectasis without acute exacerbation ([place]) 01/07/2015  ? PMR (polymyalgia rheumatica) ([place]) 01/07/2015  ? Osteoarthritis 01/07/2015  ? Asthma, chronic 11/12/2014  ? Anxiety state 11/12/2014  ? Hyperparathyroidism ([place]) 10/28/2014  ? Fibromyalgia 09/21/2014  ? Personal history of ovarian cancer 08/23/2014  ? Osteoporosis 08/23/2014  ? DM2 (diabetes mellitus, type 2) ([place]) 08/02/2014  ? HLD (hyperlipidemia) 08/02/2014  ? Hypothyroidism 08/02/2014  ? ?PCP:  [name], MD ?Pharmacy:   ?[id number] [facility] [id number]?Phone: [phone]: [phone]? ?[place] ?[address] ?[address] ?[id number]?Phone: [phone]: [phone]? ? ? ? ?Social Determinants of Health (SDOH) Interventions ?  ? ?Readmission Risk Interventions ? ?  07/27/2021  ?  2:37 PM 04/13/2021  ?  9:53 AM 01/12/2021  ?  9:32 AM  ?Readmission Risk Prevention Plan  ?Transportation Screening Complete Complete Complete  ?PCP or Specialist Appt within 3-5 Days Complete  Complete  ?[facility] Consult Complete  Complete  ?Social Work Consult for [place]/Counseling Complete    ?Palliative Care Screening Not Applicable  Not Applicable  ?Medication Review (Press photographer Complete Complete Complete  ?PCP or Specialist appointment within 3-5 days of discharge  Complete   ?[facility] Consult  Complete   ?SW Recovery Care/Counseling Consult  Complete   ?Palliative Care Screening  Not Applicable   ?[place] Not Applicable   ? ? ? ?

## 2021-07-28 DIAGNOSIS — N390 Urinary tract infection, site not specified: Secondary | ICD-10-CM | POA: Diagnosis not present

## 2021-07-28 DIAGNOSIS — D638 Anemia in other chronic diseases classified elsewhere: Secondary | ICD-10-CM

## 2021-07-28 DIAGNOSIS — G9341 Metabolic encephalopathy: Secondary | ICD-10-CM | POA: Diagnosis not present

## 2021-07-28 DIAGNOSIS — J9611 Chronic respiratory failure with hypoxia: Secondary | ICD-10-CM

## 2021-07-28 DIAGNOSIS — D7589 Other specified diseases of blood and blood-forming organs: Secondary | ICD-10-CM

## 2021-07-28 LAB — MAGNESIUM: Magnesium: 1.7 mg/dL (ref 1.7–2.4)

## 2021-07-28 LAB — COMPREHENSIVE METABOLIC PANEL
ALT: 54 U/L — ABNORMAL HIGH (ref 0–44)
AST: 41 U/L (ref 15–41)
Albumin: 3 g/dL — ABNORMAL LOW (ref 3.5–5.0)
Alkaline Phosphatase: 51 U/L (ref 38–126)
Anion gap: 8 (ref 5–15)
BUN: 15 mg/dL (ref 8–23)
CO2: 23 mmol/L (ref 22–32)
Calcium: 8.9 mg/dL (ref 8.9–10.3)
Chloride: 108 mmol/L (ref 98–111)
Creatinine, Ser: 0.64 mg/dL (ref 0.44–1.00)
GFR, Estimated: >60 mL/min (ref >60)
Glucose, Bld: 147 mg/dL — ABNORMAL HIGH (ref 70–99)
Potassium: 3.8 mmol/L (ref 3.5–5.1)
Sodium: 139 mmol/L (ref 135–145)
Total Bilirubin: 0.6 mg/dL (ref 0.3–1.2)
Total Protein: 6 g/dL — ABNORMAL LOW (ref 6.5–8.1)

## 2021-07-28 LAB — CBC WITH DIFFERENTIAL/PLATELET
Abs Immature Granulocytes: 0.04 10*3/uL (ref 0.00–0.07)
Basophils Absolute: 0 10*3/uL (ref 0.0–0.1)
Basophils Relative: 0 %
Eosinophils Absolute: 0.5 10*3/uL (ref 0.0–0.5)
Eosinophils Relative: 4 %
HCT: 32.4 % — ABNORMAL LOW (ref 36.0–46.0)
Hemoglobin: 10.7 g/dL — ABNORMAL LOW (ref 12.0–15.0)
Immature Granulocytes: 0 %
Lymphocytes Relative: 27 %
Lymphs Abs: 3.2 10*3/uL (ref 0.7–4.0)
MCH: 33.9 pg (ref 26.0–34.0)
MCHC: 33 g/dL (ref 30.0–36.0)
MCV: 102.5 fL — ABNORMAL HIGH (ref 80.0–100.0)
Monocytes Absolute: 0.5 10*3/uL (ref 0.1–1.0)
Monocytes Relative: 4 %
Neutro Abs: 7.3 10*3/uL (ref 1.7–7.7)
Neutrophils Relative %: 65 %
Platelets: 243 10*3/uL (ref 150–400)
RBC: 3.16 MIL/uL — ABNORMAL LOW (ref 3.87–5.11)
RDW: 12.4 % (ref 11.5–15.5)
WBC: 11.5 10*3/uL — ABNORMAL HIGH (ref 4.0–10.5)
nRBC: 0 % (ref 0.0–0.2)

## 2021-07-28 LAB — GLUCOSE, CAPILLARY
Glucose-Capillary: 125 mg/dL — ABNORMAL HIGH (ref 70–99)
Glucose-Capillary: 107 mg/dL — ABNORMAL HIGH (ref 70–99)
Glucose-Capillary: 206 mg/dL — ABNORMAL HIGH (ref 70–99)
Glucose-Capillary: 121 mg/dL — ABNORMAL HIGH (ref 70–99)
Glucose-Capillary: 156 mg/dL — ABNORMAL HIGH (ref 70–99)

## 2021-07-28 MED ORDER — ZINC OXIDE 12.8 % EX OINT
TOPICAL_OINTMENT | Freq: Three times a day (TID) | CUTANEOUS | Status: DC
  Administered 2021-07-28: 1 via TOPICAL
  Filled 2021-07-28 (×2): qty 56.7

## 2021-07-28 NOTE — Consult Note (Signed)
WOC Nurse Consult Note: ?Patient receiving care in [place]. Primary RN present at time of my assessment. ?Reason for Consult: peri area issues ?Wound type: MASD IAD now referred to as  Irritant Dermatitis ?[id number] - Due to fecal, urinary or dual incontinence ?Patient is dually incontinent ?Pressure Injury POA: Yes/No/NA ?Measurement: ?Wound bed: reddened ?Drainage (amount, consistency, odor) none ?Periwound: intact ?Dressing procedure/placement/frequency: ?Apply Triple Paste to buttocks, labia, upper thighs three times daily and after each incontinent episode. ? ?I also entered a turning order. ? ?Thank you for the consult.  Discussed plan of care with the patient and bedside nurse.  [facility] nurse will not follow at this time.  Please re-consult the [facility] team if needed. ? ?Val Riles, RN, MSN, CWOCN, CNS-BC, pager [phone]  ?  ?

## 2021-07-28 NOTE — Progress Notes (Signed)
PROGRESS NOTE    Kayla Thompson  JXB:[id number] DOB: 03-27-1935 DOA: 07/25/2021 PCP: Myrlene Broker, MD   Brief Narrative:  86 year old female with history of polymyalgia rheumatica/rheumatoid arthritis on chronic prednisone with chronically elevated white count, diabetes mellitus type 2, chronic diastolic heart failure, macrocytic anemia, asthma/COPD with chronic hypoxic respiratory failure requiring intermittent oxygen, ovarian cancer status post TAH/BSO, chronic left shoulder issues followed by [facility], COVID-19 infection in January 2023, actinomyces bacteremia requiring treatment with doxycycline, frequent UTIs presented with increasing confusion.  She was admitted for possible MDR UTI and started on meropenem.  Assessment & Plan:   ESBL E. coli UTI History of MDR UTI in the past -Currently on meropenem.  Urine cultures growing ESBL E. coli.  Finish at least 5-day course of therapy with meropenem.  Acute metabolic encephalopathy Possible underlying dementia -Possibly from above.  Still slow to respond.  Monitor.  Fall precautions. -Remeron on hold  Leukocytosis -Mild.  Monitor intermittently.  Mildly elevated ALT -Monitor intermittently  Rheumatoid arthritis/polymyalgia rheumatica -On chronic prednisone as an outpatient.  Follows up with rheumatology/Dr. Deanne Coffer -Continue home prednisone dose.  Outpatient follow-up  Asthma/COPD Chronic respiratory failure with hypoxia on intermittent oxygen at home -Respiratory status currently stable.  Currently on room air.  Continue current inhaled regimen  History of ovarian cancer status post TAHBSO -Outpatient follow-up with oncology  Anemia of chronic disease Macrocytosis -Hemoglobin currently stable.  Monitor intermittently.  Diabetes mellitus type 2 with hyperglycemia -Continue Lantus along with NovoLog with meals and CBGs with SSI  Hyponatremia -Resolved  Constipation--continue  Linzess.  Hyperlipidemia--Crestor on hold  Vitamin D deficiency -Hold supplements at this time  Generalized deconditioning -PT recommends SNF placement.  TOC following.   DVT prophylaxis: Heparin Code Status:DNR Family Communication: None at bedside Disposition Plan: Status is: Inpatient Remains inpatient appropriate because: Of need for IV antibiotics and stent placement   Consultants: None  Procedures: None  Antimicrobials: Meropenem from 07/25/2021   Subjective: Patient seen and examined at bedside.  Poor historian.  Slightly confused.  No agitation, fever, seizures or vomiting reported.  Objective: Vitals:   07/27/21 0520 07/27/21 1408 07/27/21 2200 07/28/21 0600  BP: (!) 149/78 138/90 (!) 154/77 135/77  Pulse: 74 92 79 75  Resp: 17 16 17 17   Temp: 97.6 F (36.4 C) 98.4 F (36.9 C) 98.2 F (36.8 C) 97.6 F (36.4 C)  TempSrc: Oral Oral Oral Oral  SpO2: 100% 100% 100% 94%  Weight:      Height:        Intake/Output Summary (Last 24 hours) at 07/28/2021 0804 Last data filed at 07/28/2021 0729 Gross per 24 hour  Intake 2236.29 ml  Output 1200 ml  Net 1036.29 ml    Filed Weights   07/25/21 1139  Weight: 42.2 kg    Examination: General: On room air.  No distress.  Looks chronically ill and deconditioned.  Extremely thinly built ENT/neck: No thyromegaly.  JVD is not elevated  respiratory: Decreased breath sounds at bases bilaterally with some crackles; no wheezing  CVS: S1-S2 heard, rate controlled currently Abdominal: Soft, nontender, slightly distended; no organomegaly, normal bowel sounds are heard Extremities: Trace lower extremity edema; no cyanosis  CNS: Awake, slow to respond, slightly confused.  No focal neurologic deficit.  Moves extremities Lymph: No obvious lymphadenopathy Skin: No obvious ecchymosis/lesions  psych: Could not be assessed because of mental status  musculoskeletal: No obvious joint swelling/deformity    Data Reviewed: I have  personally reviewed following labs and  imaging studies  CBC: Recent Labs  Lab 07/25/21 1230 07/26/21 0728 07/27/21 0500 07/28/21 0534  WBC 17.8* 12.7* 10.6* 11.5*  NEUTROABS 13.6*  --   --  7.3  HGB 10.9* 10.2* 10.6* 10.7*  HCT 33.9* 30.0* 32.0* 32.4*  MCV 102.1* 101.0* 102.2* 102.5*  PLT 264 198 207 243    Basic Metabolic Panel: Recent Labs  Lab 07/25/21 1230 07/26/21 0728 07/27/21 0500 07/28/21 0534  NA 135 133* 138 139  K 4.1 3.6 3.5 3.8  CL 100 100 105 108  CO2 26 24 24 23   GLUCOSE 214* 164* 153* 147*  BUN 34* 26* 17 15  CREATININE 0.93 0.80 0.82 0.64  CALCIUM 9.8 8.9 9.0 8.9  MG  --   --   --  1.7  PHOS  --   --  3.1  --     GFR: Estimated Creatinine Clearance: 34.9 mL/min (by C-G formula based on SCr of 0.64 mg/dL). Liver Function Tests: Recent Labs  Lab 07/25/21 1230 07/26/21 0728 07/27/21 0500 07/28/21 0534  AST 11* 13*  --  41  ALT 23 20  --  54*  ALKPHOS 42 43  --  51  BILITOT 0.5 0.5  --  0.6  PROT 6.7 5.9*  --  6.0*  ALBUMIN 4.0 3.1* 3.0* 3.0*    Recent Labs  Lab 07/25/21 1230  LIPASE <10*    No results for input(s): AMMONIA in the last 168 hours. Coagulation Profile: No results for input(s): INR, PROTIME in the last 168 hours. Cardiac Enzymes: No results for input(s): CKTOTAL, CKMB, CKMBINDEX, TROPONINI in the last 168 hours. BNP (last 3 results) Recent Labs    11/26/20 1339  PROBNP 291    HbA1C: No results for input(s): HGBA1C in the last 72 hours. CBG: Recent Labs  Lab 07/27/21 1134 07/27/21 1544 07/27/21 2207 07/28/21 0548 07/28/21 0737  GLUCAP 246* 180* 199* 125* 107*    Lipid Profile: No results for input(s): CHOL, HDL, LDLCALC, TRIG, CHOLHDL, LDLDIRECT in the last 72 hours. Thyroid Function Tests: No results for input(s): TSH, T4TOTAL, FREET4, T3FREE, THYROIDAB in the last 72 hours. Anemia Panel: No results for input(s): VITAMINB12, FOLATE, FERRITIN, TIBC, IRON, RETICCTPCT in the last 72 hours. Sepsis  Labs: Recent Labs  Lab 07/25/21 1230  LATICACIDVEN 1.3     Recent Results (from the past 240 hour(s))  Resp Panel by RT-PCR (Flu A&B, Covid) Nasopharyngeal Swab     Status: None   Collection Time: 07/25/21 12:30 PM   Specimen: Nasopharyngeal Swab; Nasopharyngeal(NP) swabs in vial transport medium  Result Value Ref Range Status   SARS Coronavirus 2 by RT PCR NEGATIVE NEGATIVE Final    Comment: (NOTE) SARS-CoV-2 target nucleic acids are NOT DETECTED.  The SARS-CoV-2 RNA is generally detectable in upper respiratory specimens during the acute phase of infection. The lowest concentration of SARS-CoV-2 viral copies this assay can detect is 138 copies/mL. A negative result does not preclude SARS-Cov-2 infection and should not be used as the sole basis for treatment or other patient management decisions. A negative result may occur with  improper specimen collection/handling, submission of specimen other than nasopharyngeal swab, presence of viral mutation(s) within the areas targeted by this assay, and inadequate number of viral copies(<138 copies/mL). A negative result must be combined with clinical observations, patient history, and epidemiological information. The expected result is Negative.  Fact Sheet for Patients:  BloggerCourse.com  Fact Sheet for Healthcare Providers:  SeriousBroker.it  This test is no t yet approved  or cleared by the [place] and  has been authorized for detection and/or diagnosis of SARS-CoV-2 by FDA under an Emergency Use Authorization (EUA). This EUA will remain  in effect (meaning this test can be used) for the duration of the COVID-19 declaration under Section 564(b)(1) of the Act, 21 U.S.C.section 360bbb-3(b)(1), unless the authorization is terminated  or revoked sooner.       Influenza A by PCR NEGATIVE NEGATIVE Final   Influenza B by PCR NEGATIVE NEGATIVE Final    Comment: (NOTE) The  Xpert Xpress SARS-CoV-2/FLU/RSV plus assay is intended as an aid in the diagnosis of influenza from Nasopharyngeal swab specimens and should not be used as a sole basis for treatment. Nasal washings and aspirates are unacceptable for Xpert Xpress SARS-CoV-2/FLU/RSV testing.  Fact Sheet for Patients: BloggerCourse.com  Fact Sheet for Healthcare Providers: SeriousBroker.it  This test is not yet approved or cleared by the [place] FDA and has been authorized for detection and/or diagnosis of SARS-CoV-2 by FDA under an Emergency Use Authorization (EUA). This EUA will remain in effect (meaning this test can be used) for the duration of the COVID-19 declaration under Section 564(b)(1) of the Act, 21 U.S.C. section 360bbb-3(b)(1), unless the authorization is terminated or revoked.  Performed at [facility], [address] [place], [place] [address]   Culture, blood (routine x 2)     Status: None (Preliminary result)   Collection Time: 07/25/21 12:30 PM   Specimen: BLOOD  Result Value Ref Range Status   Specimen Description   Final    BLOOD BLOOD LEFT FOREARM Performed at [facility], [address], [place], [place] [address]    Special Requests   Final    BOTTLES DRAWN AEROBIC AND ANAEROBIC Blood Culture adequate volume Performed at [facility], [address] [place], [place] [address]    Culture   Final    NO GROWTH 2 DAYS Performed at [facility], [address] [place], [place] [address]    Report Status PENDING  Incomplete  Urine Culture     Status: Abnormal   Collection Time: 07/25/21  2:15 PM   Specimen: Urine, Clean Catch  Result Value Ref Range Status   Specimen Description   Final    URINE, CLEAN CATCH Performed at [facility], [address], [place], [place] [address]    Special Requests   Final    NONE Performed at  [facility], [address], [place], [place] [address]    Culture (A)  Final    40,000 COLONIES/mL ESCHERICHIA COLI Confirmed Extended Spectrum Beta-Lactamase Producer (ESBL).  In bloodstream infections from ESBL organisms, carbapenems are preferred over piperacillin/tazobactam. They are shown to have a lower risk of mortality.    Report Status 07/27/2021 FINAL  Final   Organism ID, Bacteria ESCHERICHIA COLI (A)  Final      Susceptibility   Escherichia coli - MIC*    AMPICILLIN >=32 RESISTANT Resistant     CEFAZOLIN >=64 RESISTANT Resistant     CEFEPIME 16 RESISTANT Resistant     CEFTRIAXONE >=64 RESISTANT Resistant     CIPROFLOXACIN 1 RESISTANT Resistant     GENTAMICIN >=16 RESISTANT Resistant     IMIPENEM <=0.25 SENSITIVE Sensitive     NITROFURANTOIN 128 RESISTANT Resistant     TRIMETH/SULFA <=20 SENSITIVE Sensitive     AMPICILLIN/SULBACTAM >=32 RESISTANT Resistant     PIP/TAZO <=4 SENSITIVE Sensitive     * 40,000 COLONIES/mL ESCHERICHIA  COLI  Culture, blood (Routine X 2) w Reflex to ID Panel     Status: None (Preliminary result)   Collection Time: 07/25/21  6:31 PM   Specimen: BLOOD  Result Value Ref Range Status   Specimen Description   Final    BLOOD RIGHT ANTECUBITAL Performed at [facility], [address] [place], [place] [address]    Special Requests   Final    BOTTLES DRAWN AEROBIC ONLY Blood Culture adequate volume Performed at [facility], [address] [place], [place] [address]    Culture   Final    NO GROWTH 1 DAY Performed at [facility], [address] [place], [place] [address]    Report Status PENDING  Incomplete          Radiology Studies: No results found.      Scheduled Meds:  dextromethorphan-guaiFENesin  2 tablet Oral BID   DULoxetine  30 mg Oral Daily   estradiol  1 Applicatorful Vaginal Once per day on Tue Fri   feeding supplement  237 mL Oral TID WC    fluticasone furoate-vilanterol  1 puff Inhalation Daily   And   umeclidinium bromide  1 puff Inhalation Daily   Gerhardt's butt cream   Topical BID   heparin  5,000 Units Subcutaneous Q8H   insulin aspart  0-9 Units Subcutaneous TID WC   insulin aspart  2 Units Subcutaneous TID WC   insulin glargine-yfgn  5 Units Subcutaneous Q breakfast   levothyroxine  50 mcg Oral QAC breakfast   linaclotide  72 mcg Oral QAC breakfast   pantoprazole  40 mg Oral Daily   polyethylene glycol  17 g Oral BID   predniSONE  2 mg Oral Q breakfast   [START ON 07/30/2021] vitamin B-12  2,500 mcg Oral Q Thu   Continuous Infusions:  sodium chloride 50 mL/hr at 07/27/21 1253   meropenem (MERREM) IV Stopped (07/27/21 2153)          Glade Lloyd, MD [facility] 07/28/2021, 8:04 AM

## 2021-07-28 NOTE — TOC Progression Note (Signed)
Transition of Care (TOC) - Progression Note  ? ? ?Patient Details  ?Name: Kayla Thompson ?MRN: [id number] ?Date of Birth: October 29, 1935 ? ?Transition of Care (TOC) CM/SW Contact  ?Valena Ivanov, Marjie Skiff, RN ?Phone Number: ?07/28/2021, 1:02 PM ? ?Clinical Narrative:    ?Spoke with daughter [name] again for continued dc planning. [name] states that her and her sister have decided to bring pt home with continued 24hr caregivers and HHPT. [name] states that pt has used [facility] in the past and would like to use them again. [facility] liaison alerted of referral. Will need MD order for HHPT at dc. [name] states they have all DME needed. ? ? ?Expected Discharge Plan: [place] ?Barriers to Discharge: Continued Medical Work up ? ?Expected Discharge Plan and Services ?Expected Discharge Plan: [place] ?  ?Discharge Planning Services: CM Consult ?  ?Living arrangements for the past 2 months: [place] ? HH Arranged: PT ?[place] Agency: [place] ?Date HH Agency Contacted: 07/28/21 ?Time Jay: 1200 ?Representative spoke with at [place]: Tommi Rumps ? ? ?Social Determinants of Health (SDOH) Interventions ?  ? ?Readmission Risk Interventions ? ?  07/27/2021  ?  2:37 PM 04/13/2021  ?  9:53 AM 01/12/2021  ?  9:32 AM  ?Readmission Risk Prevention Plan  ?Transportation Screening Complete Complete Complete  ?PCP or Specialist Appt within 3-5 Days Complete  Complete  ?[place] or Home Care Consult Complete  Complete  ?Social Work Consult for [place] Planning/Counseling Complete    ?Palliative Care Screening Not Applicable  Not Applicable  ?Medication Review Press photographer) Complete Complete Complete  ?PCP or Specialist appointment within 3-5 days of discharge  Complete   ?[place] or Home Care Consult  Complete   ?SW Recovery Care/Counseling Consult  Complete   ?Palliative Care Screening  Not Applicable   ?[place]  Not Applicable   ? ? ?

## 2021-07-28 NOTE — Care Management Important Message (Signed)
Important Message ? ?Patient Details IM Letter placed in Patients room. ?Name: Kayla Thompson ?MRN: [id number] ?Date of Birth: 09-30-1935 ? ? ?Medicare Important Message Given:  Yes ? ? ? ? ?Kerin Salen ?07/28/2021, 9:32 AM ?

## 2021-07-28 NOTE — Telephone Encounter (Signed)
Rec'd msg stating " Alternative Requested:PT PAYING $0 FOR ROSUV. PT REQUESTING LOWER-COST ALTERNATIVE: ATORV. $22;" Pls advise.Marland KitchenJohny Chess ?

## 2021-07-29 DIAGNOSIS — B9629 Other Escherichia coli [E. coli] as the cause of diseases classified elsewhere: Secondary | ICD-10-CM | POA: Diagnosis not present

## 2021-07-29 DIAGNOSIS — Z1612 Extended spectrum beta lactamase (ESBL) resistance: Secondary | ICD-10-CM

## 2021-07-29 DIAGNOSIS — N390 Urinary tract infection, site not specified: Secondary | ICD-10-CM | POA: Diagnosis not present

## 2021-07-29 LAB — GLUCOSE, CAPILLARY
Glucose-Capillary: 129 mg/dL — ABNORMAL HIGH (ref 70–99)
Glucose-Capillary: 160 mg/dL — ABNORMAL HIGH (ref 70–99)

## 2021-07-29 NOTE — Discharge Summary (Signed)
Physician Discharge Summary  ?Kayla Thompson KMM:[id number] DOB: [birth year] DOA: 07/25/2021 ? ?PCP: [name], MD ? ?Admit date: 07/25/2021 ?Discharge date: 07/29/2021 ? ?Time spent: 27 minutes ? ?Recommendations for Outpatient Follow-up:  ?Recommend outpatient follow-up with Dr. Royce Macadamia a sample of urine was taken on 07/24/2021 which showed yeast--- patient will complete fluconazole at his direction ?Patient completed 5 days of meropenem here in the hospital ?Would suggest care coordination via Dr. Sharlet Salina with infectious disease specialist with regards to suppressive therapy etc.-not sure if would benefit from other options to prevent urinary tract infections- ?I am not sure if patient would benefit from coming off of steroids completely--I would defer these discussions to Dr. Sharlet Salina and Dr. Kathlene November as an outpatient ?Needs Chem-12 CBC in about 1 week ? ?Discharge Diagnoses:  ?MAIN problem for hospitalization  ? ?Urinary infection present prior to admission ? ?Please see below for itemized issues addressed in HOpsital- ?refer to other progress notes for clarity if needed ? ?Discharge Condition: Improved ? ?Diet recommendation: Home health ? ?Filed Weights  ? 07/25/21 1139  ?Weight: 42.2 kg  ? ? ?History of present illness:  ?86 year old white female home dwelling known polymyalgia rheumatica/rheumatoid on chronic prednisone (follows with Dr. Kathlene November) with chronically elevated white count ?DM TY 2 ?HFpEF ?Macrocytic anemia ?Asthma/COPD overlap on intermittent oxygen ?Ovarian cancer status post TAH/BSO ?Chronic left shoulder issues followed by [facility] ?COVID infection and recovered 03/31/2021-at that admission also had actinomyces bacteremia Rx doxycycline ?Frequent UTIs ?She had ESBL infection at that time in addition to possible yeast and fecal impaction-patient was discharged home on nitrofurantoin doxycycline fluconazole additionally ?  ?Lives alone at home is followed by palliative  care and has caregivers 24/7 ?Found to be encephalopathic compared to her baseline over 48 hours no new medications-unable to perform usual tasks-went to urologist apparently 5/6 and urine was sent for culture ?  ?Daughter augments h/o--she couldn't do basic things--didn't know what to do with the glucose pen [normally can] ?Regularly constipated--had some bleeding on last hard stool ?  ? ?Hospital Course:  ?Probable underlying UTI--growing 40,000 ESBL ?History of MDR organisms confounded by chronically elevated white count as is on chronic steroids ?Continue meropenem--Saline lock eventually ?Tolerating diet and doing fair ?Rheumatoid on chronic prednisone 2 mg follows Dr. Kathlene November ?As is not hypotensive no need for stress dosing ?Steroids were stopped patient needs to follow-up with outpatient rheumatologist and discuss nonsteroid options?  Biologicals versus other options for rheumatoid arthritis iron ?Asthma/COPD on intermittent oxygen ?No wheeze continue standard inhalers ?Ovarian cancer status post TAH/BSO ?Follow updated guidelines and outpatient follow-up with her oncologist ?Dm ty 2 ?CBGs130-170 0- lantus 5 ?Constipation ?continue Linzess and will need to continue her oxy for her chronic pain issues ?Chronic shoulder pain ?Continue oxy-monitor mentation, use Cymbalta 30 for the depression and pain control but lower dose--will not be dosing at 90 mg dosing on discharge ?Vitamin D deficiency-hold supplements at this time ?Hyperlipidemia-hold Crestor during hospitalization but was resumed ?Attempt to reduce polypharmacy hold Remeron at this time and can resume in the outpatient setting  ? ? ?Discharge Exam: ?Vitals:  ? 07/29/21 0546 07/29/21 1227  ?BP: (!) 172/70 140/76  ?Pulse: 69 71  ?Resp: 14 18  ?Temp: 98.2 ?F (36.8 ?C) 97.9 ?F (36.6 ?C)  ?SpO2: 98% (!) 87%  ? ? ?Subj on day of d/c ?  ?Awake coherent no distress EOMI NCAT no focal deficit ? ?General Exam on discharge ? ?EOMI NCAT no focal deficit coherent  alert ?  S1-S2 no murmur no rub no gallop ?Chest is clear no rales no rhonchi ?Abdomen soft no rebound no guarding ?Neurologically intact no focal deficit ?Skin is intact ? ?Discharge Instructions ? ? ?Discharge Instructions   ? ? Diet - low sodium heart healthy   Complete by: As directed ?  ? Discharge instructions   Complete by: As directed ?  ? Completely antifungals called in by Dr. Alan Ripper office-you may have had 2 separate things going on with the urine-patient did complete meropenem during hospital stay and I will CC Dr. Diona Fanti to ensure that you are not lost to follow-up and that he can discuss with you suppressive antibacterial therapy for your frequent urinary infections versus referral to an infectious disease specialist ?It would be helpful/useful to empty your bladder several times before bed and drink your last fluids in the evening at around 1700-this may prevent stasis of water in your bladder and may decrease the frequency of your urinary tract infections  ? Increase activity slowly   Complete by: As directed ?  ? ?  ? ?Allergies as of 07/29/2021   ? ?   Reactions  ? Tape Other (See Comments)  ? SKIN IS VERY THIN AND DELICATE- will tear like paper!!  ? Lasix [furosemide] Other (See Comments)  ? Confusion- MD stopped this med  ? Latex Rash  ? Penicillins Rash  ? Has patient had a PCN reaction causing immediate rash, facial/tongue/throat swelling, SOB or lightheadedness with hypotension: No ?Has patient had a PCN reaction causing severe rash involving mucus membranes or skin necrosis: No ?Has patient had a PCN reaction that required hospitalization: No ?Has patient had a PCN reaction occurring within the last 10 years: No ?If all of the above answers are "NO", then may proceed with Cephalosporin use.  ? ?  ? ?  ?Medication List  ?  ? ?STOP taking these medications   ? ?acetaminophen 650 MG CR tablet ?Commonly known as: TYLENOL ?  ?CALTRATE 600 PO ?  ?denosumab 60 MG/ML Sosy injection ?Commonly  known as: PROLIA ?  ?docusate sodium 100 MG capsule ?Commonly known as: COLACE ?  ?montelukast 10 MG tablet ?Commonly known as: SINGULAIR ?  ?nitrofurantoin (macrocrystal-monohydrate) 100 MG capsule ?Commonly known as: MACROBID ?  ? ?  ? ?TAKE these medications   ? ?BD Pen Needle Nano 2nd Gen 32G X 4 MM Misc ?Generic drug: Insulin Pen Needle ?USE AS DIRECTED 4 TIMES A DAY ?  ?cholecalciferol 25 MCG (1000 UNIT) tablet ?Commonly known as: VITAMIN D3 ?Take 1,000 Units by mouth daily. ?  ?DULoxetine 30 MG capsule ?Commonly known as: CYMBALTA ?TAKE 1 CAPSULE BY MOUTH EVERY DAY ?What changed:  ?how much to take ?additional instructions ?Another medication with the same name was removed. Continue taking this medication, and follow the directions you see here. ?  ?estradiol 0.1 MG/GM vaginal cream ?Commonly known as: ESTRACE ?Place 1 Applicatorful vaginally See admin instructions. Takes 2 times a week ?  ?feeding supplement Liqd ?Take 237 mLs by mouth 2 (two) times daily between meals. ?What changed: when to take this ?  ?freestyle lancets ?Use to test blood sugar 3 times daily. Dx: E11.65 ?  ?FREESTYLE LITE test strip ?Generic drug: glucose blood ?USE TO TEST BLOOD SUGAR 3 TIMES DAILY ?  ?insulin glargine 100 UNIT/ML Solostar Pen ?Commonly known as: LANTUS ?Inject 5 Units into the skin in the morning. ?  ?ipratropium-albuterol 0.5-2.5 (3) MG/3ML Soln ?Commonly known as: DUONEB ?Inhale 3 mLs into the lungs  every 4 (four) hours as needed. ?  ?levothyroxine 50 MCG tablet ?Commonly known as: SYNTHROID ?TAKE 1 TABLET BY MOUTH EVERY DAY BEFORE BREAKFAST ?What changed: See the new instructions. ?  ?linaclotide 72 MCG capsule ?Commonly known as: Linzess ?Take 1 capsule (72 mcg total) by mouth daily before breakfast. ?  ?methenamine 1 g tablet ?Commonly known as: MANDELAMINE ?Take 1,000 mg by mouth 2 (two) times daily. ?  ?Mucinex DM Maximum Strength 60-1200 MG Tb12 ?Take 1 tablet by mouth in the morning and at bedtime. ?   ?naloxone 4 MG/0.1ML Liqd nasal spray kit ?Commonly known as: NARCAN ?Place 1 spray into the nose once as needed (overdose). ?  ?omeprazole 20 MG capsule ?Commonly known as: PRILOSEC ?Take 1 capsule (20 mg total) by

## 2021-07-29 NOTE — Telephone Encounter (Signed)
Crestor prescribed and on current list 1 year supply done in 04/2021 is there a concern here? ?

## 2021-07-29 NOTE — Consult Note (Signed)
[facility] CM Inpatient Consult ? ? ?07/29/2021 ? ?Kayla Thompson ?1935/11/30 ?[id number] ? ?[facility] CM) ?  ?Patient chart has been reviewed with noted high risk score for unplanned readmissions.  Patient assessed for community [facility] follow up needs. ? ?Per review, patient followed by palliative services at home and 24/7 caregivers. Patient's primary provider office is listed for transition of care post hospital follow up call. No [facility] CM needs identified. ? ?Of note, [facility] Management services does not replace or interfere with any services that are arranged by inpatient case management or social work.  ?  ?Netta Cedars, MSN, RN ?[facility] Liaison ?Phone [phone] ?Toll free office [phone]   ? ?

## 2021-07-30 LAB — CULTURE, BLOOD (ROUTINE X 2)
Culture: NO GROWTH
Special Requests: ADEQUATE

## 2021-07-31 LAB — CULTURE, BLOOD (ROUTINE X 2)
Culture: NO GROWTH
Special Requests: ADEQUATE

## 2021-08-03 ENCOUNTER — Ambulatory Visit: Payer: Medicare Other | Admitting: Podiatry

## 2021-08-05 DIAGNOSIS — M810 Age-related osteoporosis without current pathological fracture: Secondary | ICD-10-CM | POA: Diagnosis not present

## 2021-08-05 DIAGNOSIS — M47814 Spondylosis without myelopathy or radiculopathy, thoracic region: Secondary | ICD-10-CM | POA: Diagnosis not present

## 2021-08-05 DIAGNOSIS — I110 Hypertensive heart disease with heart failure: Secondary | ICD-10-CM | POA: Diagnosis not present

## 2021-08-05 DIAGNOSIS — I5030 Unspecified diastolic (congestive) heart failure: Secondary | ICD-10-CM | POA: Diagnosis not present

## 2021-08-05 DIAGNOSIS — M353 Polymyalgia rheumatica: Secondary | ICD-10-CM | POA: Diagnosis not present

## 2021-08-05 DIAGNOSIS — E559 Vitamin D deficiency, unspecified: Secondary | ICD-10-CM | POA: Diagnosis not present

## 2021-08-05 DIAGNOSIS — Z792 Long term (current) use of antibiotics: Secondary | ICD-10-CM | POA: Diagnosis not present

## 2021-08-05 DIAGNOSIS — M797 Fibromyalgia: Secondary | ICD-10-CM | POA: Diagnosis not present

## 2021-08-05 DIAGNOSIS — M19011 Primary osteoarthritis, right shoulder: Secondary | ICD-10-CM | POA: Diagnosis not present

## 2021-08-05 DIAGNOSIS — B3741 Candidal cystitis and urethritis: Secondary | ICD-10-CM | POA: Diagnosis not present

## 2021-08-05 DIAGNOSIS — F419 Anxiety disorder, unspecified: Secondary | ICD-10-CM | POA: Diagnosis not present

## 2021-08-05 DIAGNOSIS — E785 Hyperlipidemia, unspecified: Secondary | ICD-10-CM | POA: Diagnosis not present

## 2021-08-05 DIAGNOSIS — D72829 Elevated white blood cell count, unspecified: Secondary | ICD-10-CM | POA: Diagnosis not present

## 2021-08-05 DIAGNOSIS — J449 Chronic obstructive pulmonary disease, unspecified: Secondary | ICD-10-CM | POA: Diagnosis not present

## 2021-08-05 DIAGNOSIS — M19012 Primary osteoarthritis, left shoulder: Secondary | ICD-10-CM | POA: Diagnosis not present

## 2021-08-05 DIAGNOSIS — G8929 Other chronic pain: Secondary | ICD-10-CM | POA: Diagnosis not present

## 2021-08-05 DIAGNOSIS — Z794 Long term (current) use of insulin: Secondary | ICD-10-CM | POA: Diagnosis not present

## 2021-08-05 DIAGNOSIS — D509 Iron deficiency anemia, unspecified: Secondary | ICD-10-CM | POA: Diagnosis not present

## 2021-08-05 DIAGNOSIS — K219 Gastro-esophageal reflux disease without esophagitis: Secondary | ICD-10-CM | POA: Diagnosis not present

## 2021-08-05 DIAGNOSIS — E119 Type 2 diabetes mellitus without complications: Secondary | ICD-10-CM | POA: Diagnosis not present

## 2021-08-05 DIAGNOSIS — Z8701 Personal history of pneumonia (recurrent): Secondary | ICD-10-CM | POA: Diagnosis not present

## 2021-08-05 DIAGNOSIS — G9341 Metabolic encephalopathy: Secondary | ICD-10-CM | POA: Diagnosis not present

## 2021-08-05 DIAGNOSIS — J9611 Chronic respiratory failure with hypoxia: Secondary | ICD-10-CM | POA: Diagnosis not present

## 2021-08-05 DIAGNOSIS — E039 Hypothyroidism, unspecified: Secondary | ICD-10-CM | POA: Diagnosis not present

## 2021-08-05 DIAGNOSIS — F32A Depression, unspecified: Secondary | ICD-10-CM | POA: Diagnosis not present

## 2021-08-08 DIAGNOSIS — B3741 Candidal cystitis and urethritis: Secondary | ICD-10-CM | POA: Diagnosis not present

## 2021-08-08 DIAGNOSIS — I110 Hypertensive heart disease with heart failure: Secondary | ICD-10-CM | POA: Diagnosis not present

## 2021-08-08 DIAGNOSIS — M353 Polymyalgia rheumatica: Secondary | ICD-10-CM | POA: Diagnosis not present

## 2021-08-08 DIAGNOSIS — I5030 Unspecified diastolic (congestive) heart failure: Secondary | ICD-10-CM | POA: Diagnosis not present

## 2021-08-08 DIAGNOSIS — E119 Type 2 diabetes mellitus without complications: Secondary | ICD-10-CM | POA: Diagnosis not present

## 2021-08-08 DIAGNOSIS — G9341 Metabolic encephalopathy: Secondary | ICD-10-CM | POA: Diagnosis not present

## 2021-08-11 ENCOUNTER — Ambulatory Visit (INDEPENDENT_AMBULATORY_CARE_PROVIDER_SITE_OTHER): Payer: Medicare Other

## 2021-08-11 DIAGNOSIS — B3741 Candidal cystitis and urethritis: Secondary | ICD-10-CM | POA: Diagnosis not present

## 2021-08-11 DIAGNOSIS — E119 Type 2 diabetes mellitus without complications: Secondary | ICD-10-CM | POA: Diagnosis not present

## 2021-08-11 DIAGNOSIS — Z0000 Encounter for general adult medical examination without abnormal findings: Secondary | ICD-10-CM

## 2021-08-11 DIAGNOSIS — M353 Polymyalgia rheumatica: Secondary | ICD-10-CM | POA: Diagnosis not present

## 2021-08-11 DIAGNOSIS — I110 Hypertensive heart disease with heart failure: Secondary | ICD-10-CM | POA: Diagnosis not present

## 2021-08-11 DIAGNOSIS — G9341 Metabolic encephalopathy: Secondary | ICD-10-CM | POA: Diagnosis not present

## 2021-08-11 DIAGNOSIS — I5030 Unspecified diastolic (congestive) heart failure: Secondary | ICD-10-CM | POA: Diagnosis not present

## 2021-08-11 NOTE — Patient Instructions (Signed)
Kayla Thompson , Thank you for taking time to come for your Medicare Wellness Visit. I appreciate your ongoing commitment to your health goals. Please review the following plan we discussed and let me know if I can assist you in the future.   Screening recommendations/referrals: Colonoscopy: Discontinued due to age 86: Discontinued due to shoulder pain Bone Density: 01/05/2018; due every 2-3 years Recommended yearly ophthalmology/optometry visit for glaucoma screening and checkup Recommended yearly dental visit for hygiene and checkup  Vaccinations: Influenza vaccine: due Fall Season 2023 Pneumococcal vaccine: 12/18/2015, 02/26/2019 Tdap vaccine: 04/07/2017; due every 10 years Shingles vaccine: never done   Covid-19: 03/26/2019, 04/23/2019, 4/12/221, 03/13/2020  Advanced directives: Yes; Please bring a copy of your health care power of attorney and living will to the office at your convenience.  Conditions/risks identified: Yes  Next appointment: Please schedule your next Medicare Wellness Visit with your Nurse Health Advisor in 1 year by calling [phone].   Preventive Care 7 Years and Older, Female Preventive care refers to lifestyle choices and visits with your health care provider that can promote health and wellness. What does preventive care include? A yearly physical exam. This is also called an annual well check. Dental exams once or twice a year. Routine eye exams. Ask your health care provider how often you should have your eyes checked. Personal lifestyle choices, including: Daily care of your teeth and gums. Regular physical activity. Eating a healthy diet. Avoiding tobacco and drug use. Limiting alcohol use. Practicing safe sex. Taking low-dose aspirin every day. Taking vitamin and mineral supplements as recommended by your health care provider. What happens during an annual well check? The services and screenings done by your health care provider during your  annual well check will depend on your age, overall health, lifestyle risk factors, and family history of disease. Counseling  Your health care provider may ask you questions about your: Alcohol use. Tobacco use. Drug use. Emotional well-being. Home and relationship well-being. Sexual activity. Eating habits. History of falls. Memory and ability to understand (cognition). Work and work Statistician. Reproductive health. Screening  You may have the following tests or measurements: Height, weight, and BMI. Blood pressure. Lipid and cholesterol levels. These may be checked every 5 years, or more frequently if you are over 45 years old. Skin check. Lung cancer screening. You may have this screening every year starting at age 41 if you have a 30-pack-year history of smoking and currently smoke or have quit within the past 15 years. Fecal occult blood test (FOBT) of the stool. You may have this test every year starting at age 16. Flexible sigmoidoscopy or colonoscopy. You may have a sigmoidoscopy every 5 years or a colonoscopy every 10 years starting at age 4. Hepatitis C blood test. Hepatitis B blood test. Sexually transmitted disease (STD) testing. Diabetes screening. This is done by checking your blood sugar (glucose) after you have not eaten for a while (fasting). You may have this done every 1-3 years. Bone density scan. This is done to screen for osteoporosis. You may have this done starting at age 13. Mammogram. This may be done every 1-2 years. Talk to your health care provider about how often you should have regular mammograms. Talk with your health care provider about your test results, treatment options, and if necessary, the need for more tests. Vaccines  Your health care provider may recommend certain vaccines, such as: Influenza vaccine. This is recommended every year. Tetanus, diphtheria, and acellular pertussis (Tdap, Td) vaccine. You may  need a Td booster every 10  years. Zoster vaccine. You may need this after age 61. Pneumococcal 13-valent conjugate (PCV13) vaccine. One dose is recommended after age 61. Pneumococcal polysaccharide (PPSV23) vaccine. One dose is recommended after age 54. Talk to your health care provider about which screenings and vaccines you need and how often you need them. This information is not intended to replace advice given to you by your health care provider. Make sure you discuss any questions you have with your health care provider. Document Released: 04/04/2015 Document Revised: 11/26/2015 Document Reviewed: 01/07/2015 Elsevier Interactive Patient Education  2017 [place] Prevention in the Home Falls can cause injuries. They can happen to people of all ages. There are many things you can do to make your home safe and to help prevent falls. What can I do on the outside of my home? Regularly fix the edges of walkways and driveways and fix any cracks. Remove anything that might make you trip as you walk through a door, such as a raised step or threshold. Trim any bushes or trees on the path to your home. Use bright outdoor lighting. Clear any walking paths of anything that might make someone trip, such as rocks or tools. Regularly check to see if handrails are loose or broken. Make sure that both sides of any steps have handrails. Any raised decks and porches should have guardrails on the edges. Have any leaves, snow, or ice cleared regularly. Use sand or salt on walking paths during winter. Clean up any spills in your garage right away. This includes oil or grease spills. What can I do in the bathroom? Use night lights. Install grab bars by the toilet and in the tub and shower. Do not use towel bars as grab bars. Use non-skid mats or decals in the tub or shower. If you need to sit down in the shower, use a plastic, non-slip stool. Keep the floor dry. Clean up any water that spills on the floor as soon as it  happens. Remove soap buildup in the tub or shower regularly. Attach bath mats securely with double-sided non-slip rug tape. Do not have throw rugs and other things on the floor that can make you trip. What can I do in the bedroom? Use night lights. Make sure that you have a light by your bed that is easy to reach. Do not use any sheets or blankets that are too big for your bed. They should not hang down onto the floor. Have a firm chair that has side arms. You can use this for support while you get dressed. Do not have throw rugs and other things on the floor that can make you trip. What can I do in the kitchen? Clean up any spills right away. Avoid walking on wet floors. Keep items that you use a lot in easy-to-reach places. If you need to reach something above you, use a strong step stool that has a grab bar. Keep electrical cords out of the way. Do not use floor polish or wax that makes floors slippery. If you must use wax, use non-skid floor wax. Do not have throw rugs and other things on the floor that can make you trip. What can I do with my stairs? Do not leave any items on the stairs. Make sure that there are handrails on both sides of the stairs and use them. Fix handrails that are broken or loose. Make sure that handrails are as long as the stairways.  Check any carpeting to make sure that it is firmly attached to the stairs. Fix any carpet that is loose or worn. Avoid having throw rugs at the top or bottom of the stairs. If you do have throw rugs, attach them to the floor with carpet tape. Make sure that you have a light switch at the top of the stairs and the bottom of the stairs. If you do not have them, ask someone to add them for you. What else can I do to help prevent falls? Wear shoes that: Do not have high heels. Have rubber bottoms. Are comfortable and fit you well. Are closed at the toe. Do not wear sandals. If you use a stepladder: Make sure that it is fully opened.  Do not climb a closed stepladder. Make sure that both sides of the stepladder are locked into place. Ask someone to hold it for you, if possible. Clearly mark and make sure that you can see: Any grab bars or handrails. First and last steps. Where the edge of each step is. Use tools that help you move around (mobility aids) if they are needed. These include: Canes. Walkers. Scooters. Crutches. Turn on the lights when you go into a dark area. Replace any light bulbs as soon as they burn out. Set up your furniture so you have a clear path. Avoid moving your furniture around. If any of your floors are uneven, fix them. If there are any pets around you, be aware of where they are. Review your medicines with your doctor. Some medicines can make you feel dizzy. This can increase your chance of falling. Ask your doctor what other things that you can do to help prevent falls. This information is not intended to replace advice given to you by your health care provider. Make sure you discuss any questions you have with your health care provider. Document Released: 01/02/2009 Document Revised: 08/14/2015 Document Reviewed: 04/12/2014 Elsevier Interactive Patient Education  2017 [employer].

## 2021-08-11 NOTE — Progress Notes (Addendum)
I connected with Kayla Thompson today by telephone and verified that I am speaking with the correct person using two identifiers. Location patient: home Location provider: work Persons participating in the virtual visit: patient, Kayla Thompson (daughter) and provider.   I discussed the limitations, risks, security and privacy concerns of performing an evaluation and management service by telephone and the availability of in person appointments. I also discussed with the patient that there may be a patient responsible charge related to this service. The patient expressed understanding and verbally consented to this telephonic visit.    Interactive audio and video telecommunications were attempted between this provider and patient, however failed, due to patient having technical difficulties OR patient did not have access to video capability.  We continued and completed visit with audio only.  Some vital signs may be absent or patient reported.   Time Spent with patient on telephone encounter: 30 minutes  Subjective:   Kayla Thompson is a 86 y.o. female who presents for Medicare Annual (Subsequent) preventive examination.  Review of Systems     Cardiac Risk Factors include: advanced age (>92mn, >>47women);dyslipidemia;family history of premature cardiovascular disease     Objective:    Today's Vitals   08/11/21 1306  PainSc: 2    There is no height or weight on file to calculate BMI.     08/11/2021    1:24 PM 07/25/2021    8:00 PM 04/11/2021   11:41 AM 03/26/2021   11:27 AM 11/17/2020    5:51 PM 07/30/2020    8:42 PM 07/30/2020    4:24 PM  Advanced Directives  Does Patient Have a Medical Advance Directive? Yes Yes No No No Yes Yes  Type of AIndustrial/product designerof [place] will [place] Does patient want to make changes to medical advance directive? No - Patient declined No - Patient  declined    No - Patient declined   Copy of [place] Chart? No - copy requested No - copy requested    No - copy requested No - copy requested  Would patient like information on creating a medical advance directive?   No - Patient declined No - Patient declined No - Patient declined      Current Medications (verified) Outpatient Encounter Medications as of 08/11/2021  Medication Sig   Ascorbic Acid (VITAMIN C) 1000 MG tablet Take 1,000 mg by mouth in the morning and at bedtime.   BD PEN NEEDLE NANO 2ND GEN 32G X 4 MM MISC USE AS DIRECTED 4 TIMES A DAY   cholecalciferol (VITAMIN D3) 25 MCG (1000 UNIT) tablet Take 1,000 Units by mouth daily. (Patient not taking: Reported on 07/25/2021)   Cyanocobalamin (VITAMIN B 12 PO) Take 2,500 mcg by mouth every Thursday.   Dextromethorphan-guaiFENesin (MUCINEX DM MAXIMUM STRENGTH) 60-1200 MG TB12 Take 1 tablet by mouth in the morning and at bedtime.   DULoxetine (CYMBALTA) 30 MG capsule TAKE 1 CAPSULE BY MOUTH EVERY DAY (Patient taking differently: Take 30 mg by mouth daily. Patient takes with 60 mg for a total dose of 90 mg)   estradiol (ESTRACE) 0.1 MG/GM vaginal cream Place 1 Applicatorful vaginally See admin instructions. Takes 2 times a week   feeding supplement (ENSURE ENLIVE / ENSURE PLUS) LIQD Take 237 mLs by mouth 2 (two) times daily between meals. (Patient taking differently: Take 237 mLs by mouth 3 (three) times daily with  meals.)   FREESTYLE LITE test strip USE TO TEST BLOOD SUGAR 3 TIMES DAILY   insulin glargine (LANTUS) 100 UNIT/ML Solostar Pen Inject 5 Units into the skin in the morning.   ipratropium-albuterol (DUONEB) 0.5-2.5 (3) MG/3ML SOLN Inhale 3 mLs into the lungs every 4 (four) hours as needed.   Lancets (FREESTYLE) lancets Use to test blood sugar 3 times daily. Dx: E11.65   levothyroxine (SYNTHROID) 50 MCG tablet TAKE 1 TABLET BY MOUTH EVERY DAY BEFORE BREAKFAST (Patient taking differently: Take 50 mcg by mouth daily  before breakfast.)   linaclotide (LINZESS) 72 MCG capsule Take 1 capsule (72 mcg total) by mouth daily before breakfast.   methenamine (MANDELAMINE) 1 g tablet Take 1,000 mg by mouth 2 (two) times daily.   naloxone (NARCAN) nasal spray 4 mg/0.1 mL Place 1 spray into the nose once as needed (overdose).   omeprazole (PRILOSEC) 20 MG capsule Take 1 capsule (20 mg total) by mouth 2 (two) times daily before a meal.   oxyCODONE-acetaminophen (PERCOCET) 7.5-325 MG tablet Take 1 tablet by mouth See admin instructions. Daughter states can take up to 5 times daily as needed   polyethylene glycol (MIRALAX / GLYCOLAX) 17 g packet Take 17 g by mouth 2 (two) times daily. Decrease to daily or every other day pending on bowel movement frequency/consistency   predniSONE (DELTASONE) 1 MG tablet Take 1 tablet (1 mg total) by mouth in the morning and at bedtime. (Patient taking differently: Take 1 mg by mouth every other day. This week  its Tuesday,Thursday & Saturday)   rosuvastatin (CRESTOR) 10 MG tablet TAKE 1 TABLET BY MOUTH EVERY DAY IN THE EVENING   TRELEGY ELLIPTA 100-62.5-25 MCG/INH AEPB INHALE 1 PUFF INTO THE LUNGS DAILY. RINSE MOUTH (Patient taking differently: Inhale 1 puff into the lungs in the morning.)   TURMERIC PO Take 1,000 mg by mouth in the morning.   No facility-administered encounter medications on file as of 08/11/2021.    Allergies (verified) Tape, Lasix [furosemide], Latex, and Penicillins   History: Past Medical History:  Diagnosis Date   Anxiety    Arthritis    Asthma    Cataracts, bilateral    removed by surgery   COPD (chronic obstructive pulmonary disease) ([place])    Depression    Diabetes mellitus without complication ([place])    type 2   Dyspnea    with exertion   Fibromyalgia    GERD (gastroesophageal reflux disease)    History of chemotherapy    History of fractured pelvis    Hypertension    Hypothyroidism    Osteoporosis 12/2017   T score -2.8 stable from prior DEXA    Osteoporosis    Ovarian cancer ([place])    Pneumonia    several times   Past Surgical History:  Procedure Laterality Date   ABDOMINAL HYSTERECTOMY  1996   ANKLE FRACTURE SURGERY     plate and screws   BIOPSY  02/05/2019   Procedure: BIOPSY;  Surgeon: Irving Copas., MD;  Location: [facility] ENDOSCOPY;  Service: Gastroenterology;;   BLADDER SUSPENSION     CATARACT EXTRACTION     COLONOSCOPY     ESOPHAGEAL BRUSHING  02/05/2019   Procedure: ESOPHAGEAL BRUSHING;  Surgeon: Irving Copas., MD;  Location: [facility] ENDOSCOPY;  Service: Gastroenterology;;   ESOPHAGOGASTRODUODENOSCOPY (EGD) WITH PROPOFOL N/A 02/05/2019   Procedure: ESOPHAGOGASTRODUODENOSCOPY (EGD) WITH PROPOFOL with dialtion;  Surgeon: Irving Copas., MD;  Location: [place];  Service: Gastroenterology;  Laterality: N/A;   EYE  SURGERY     cataracts removed-bilateral   hip surgey     knee surgey     LUNG SURGERY     OOPHORECTOMY     BSO   SAVORY DILATION N/A 02/05/2019   Procedure: SAVORY DILATION;  Surgeon: Rush Landmark Telford Nab., MD;  Location: [place];  Service: Gastroenterology;  Laterality: N/A;   TONSILLECTOMY     UPPER GI ENDOSCOPY     Family History  Problem Relation Age of Onset   Lung cancer Mother    CAD Father    Diabetes Father    Lung cancer Maternal Aunt    Diabetes Paternal Aunt    Diabetes Paternal Uncle    Diabetes Paternal Grandmother    Diabetes Paternal Grandfather    Colon cancer Neg Hx    Esophageal cancer Neg Hx    Inflammatory bowel disease Neg Hx    Liver disease Neg Hx    Pancreatic cancer Neg Hx    Rectal cancer Neg Hx    Stomach cancer Neg Hx    Social History   Socioeconomic History   Marital status: Widowed    Spouse name: Not on file   Number of children: 2   Years of education: Not on file   Highest education level: Not on file  Occupational History   Not on file  Tobacco Use   Smoking status: Never    Passive exposure: Never   Smokeless tobacco:  Never  Vaping Use   Vaping Use: Never used  Substance and Sexual Activity   Alcohol use: No    Alcohol/week: 0.0 standard drinks   Drug use: No   Sexual activity: Not Currently    Birth control/protection: Post-menopausal    Comment: declined sexual Hx questions  Other Topics Concern   Not on file  Social History Narrative   Not on file   Social Determinants of Health   Financial Resource Strain: Low Risk    Difficulty of Paying Living Expenses: Not hard at all  Food Insecurity: No Food Insecurity   Worried About Charity fundraiser in the Last Year: Never true   Ran Out of Food in the Last Year: Never true  Transportation Needs: No Transportation Needs   Lack of Transportation (Medical): No   Lack of Transportation (Non-Medical): No  Physical Activity: Inactive   Days of Exercise per Week: 0 days   Minutes of Exercise per Session: 0 min  Stress: No Stress Concern Present   Feeling of Stress : Not at all  Social Connections: Moderately Integrated   Frequency of Communication with Friends and Family: More than three times a week   Frequency of Social Gatherings with Friends and Family: More than three times a week   Attends Religious Services: 1 to 4 times per year   Active Member of [employer] or Organizations: Yes   Attends Archivist Meetings: 1 to 4 times per year   Marital Status: Widowed    Tobacco Counseling Counseling given: Not Answered   Clinical Intake:  Pre-visit preparation completed: Yes  Pain : No/denies pain Pain Score: 2  Faces Pain Scale: Hurts a little bit Pain Type: Chronic pain Pain Location: Shoulder Pain Orientation: Right, Left Pain Descriptors / Indicators: Discomfort Pain Frequency: Constant  Faces Pain Scale: Hurts a little bit  BMI - recorded: 18.77 Nutritional Status: BMI <19  Underweight Nutritional Risks: None Diabetes: No  How often do you need to have someone help you when you read instructions, pamphlets,  or other  written materials from your doctor or pharmacy?: 1 - Never What is the last grade level you completed in school?: 2 years of college  Diabetic? prediabetes  Interpreter Needed?: No  Information entered by :: Lisette Abu, LPN.   Activities of Daily Living    08/11/2021    1:26 PM 07/25/2021    7:00 PM  In your present state of health, do you have any difficulty performing the following activities:  Hearing? 1 1  Vision? 0 0  Difficulty concentrating or making decisions? 0 1  Walking or climbing stairs? 0 1  Dressing or bathing? 0 1  Doing errands, shopping? 0 1  Preparing Food and eating ? Y   Using the Toilet? Y   In the past six months, have you accidently leaked urine? Y   Do you have problems with loss of bowel control? N   Managing your Medications? Y   Managing your Finances? Y   Housekeeping or managing your Housekeeping? Y     Patient Care Team: [name], MD as PCP - General (Internal Medicine) Werner Lean, MD as PCP - Cardiology (Cardiology) Fontaine, Belinda Block, MD (Inactive) as Consulting Physician (Gynecology) Noralee Space, MD as Consulting Physician (Pulmonary Disease) Rutherford Guys, MD as Consulting Physician (Ophthalmology) Philemon Kingdom, MD as Consulting Physician (Internal Medicine)  Indicate any recent Medical Services you may have received from other than [facility] providers in the past year (date may be approximate).     Assessment:   This is a routine wellness examination for [place].  Hearing/Vision screen Hearing Screening - Comments:: Patient has difficulty hearing. No hearing aids. Audiology consult scheduled for 08/15/2021 Vision Screening - Comments:: Patient does wear corrective lenses/contacts.  Eye exam done by: Rutherford Guys, MD.   Dietary issues and exercise activities discussed: Current Exercise Habits: The patient does not participate in regular exercise at present, Exercise limited by: orthopedic  condition(s);respiratory conditions(s);Other - see comments (Polymyalgia Rheumatica)   Goals Addressed             This Visit's Progress    Patient declined health goal at this time.        Depression Screen    08/11/2021    1:13 PM 04/20/2021    3:00 PM 07/15/2020    3:02 PM 06/19/2020    4:00 PM 01/23/2020    3:39 PM 01/08/2019    1:51 PM 11/29/2017    4:07 PM  PHQ 2/9 Scores  PHQ - 2 Score 4  4 0 0 1 2  PHQ- 9 Score '10  13    5  '$ Exception Documentation  Other- indicate reason in comment box       Not completed  Pt's health is declining recently.         Fall Risk    08/11/2021    1:11 PM 06/19/2020    4:01 PM 02/18/2020    4:13 PM 01/08/2019    1:51 PM 11/29/2017    4:07 PM  [place] in the past year? 0 1 1 0 Yes  Number falls in past yr: 0 0 1  2 or more  Injury with Fall? 0 1 1  Yes  Risk Factor Category      High Fall Risk  Risk for fall due to : Impaired balance/gait;Orthopedic patient Impaired balance/gait Impaired mobility;Impaired balance/gait;History of fall(s)    Follow up Falls prevention discussed;Falls evaluation completed Falls evaluation completed;Education provided;Falls prevention discussed Falls prevention discussed;Education  provided  Education provided;Falls prevention discussed    FALL RISK PREVENTION PERTAINING TO THE HOME:  Any stairs in or around the home? No  If so, are there any without handrails? No  Home free of loose throw rugs in walkways, pet beds, electrical cords, etc? Yes  Adequate lighting in your home to reduce risk of falls? Yes   ASSISTIVE DEVICES UTILIZED TO PREVENT FALLS:  Life alert? Yes  Use of a cane, walker or w/c? Yes  Grab bars in the bathroom? Yes  Shower chair or bench in shower? Yes  Elevated toilet seat or a handicapped toilet? Yes   TIMED UP AND GO:  Was the test performed? No .  Length of time to ambulate 10 feet: n/a sec.   Appearance of gait: Patient not evaluated for gait during this  visit.  Cognitive Function:    08/11/2021    1:28 PM 11/29/2017    4:09 PM  MMSE - Mini Mental State Exam  Not completed: Unable to complete Refused        Immunizations Immunization History  Administered Date(s) Administered   Fluad Quad(high Dose 65+) 01/08/2019, 01/01/2020   Influenza, High Dose Seasonal PF 12/01/2016, 11/29/2017   Influenza,inj,Quad PF,6+ Mos 01/17/2015, 12/18/2015   PFIZER(Purple Top)SARS-COV-2 Vaccination 03/26/2019, 04/23/2019, 07/02/2019, 03/13/2020   Pneumococcal Conjugate-13 02/26/2019   Pneumococcal Polysaccharide-23 12/18/2015   Tdap 08/02/2014, 04/07/2017    TDAP status: Up to date  Flu Vaccine status: Due, Education has been provided regarding the importance of this vaccine. Advised may receive this vaccine at local pharmacy or Health Dept. Aware to provide a copy of the vaccination record if obtained from local pharmacy or Health Dept. Verbalized acceptance and understanding.  Pneumococcal vaccine status: Up to date  Covid-19 vaccine status: Completed vaccines  Qualifies for Shingles Vaccine? Yes   Zostavax completed No   Shingrix Completed?: No.    Education has been provided regarding the importance of this vaccine. Patient has been advised to call insurance company to determine out of pocket expense if they have not yet received this vaccine. Advised may also receive vaccine at local pharmacy or Health Dept. Verbalized acceptance and understanding.  Screening Tests Health Maintenance  Topic Date Due   Zoster Vaccines- Shingrix (1 of 2) Never done   URINE MICROALBUMIN  01/08/2020   COVID-19 Vaccine (5 - Booster for Pfizer series) 05/08/2020   OPHTHALMOLOGY EXAM  09/30/2020   INFLUENZA VACCINE  10/20/2021   FOOT EXAM  01/27/2022   TETANUS/TDAP  04/08/2027   Pneumonia Vaccine 60+ Years old  Completed   DEXA SCAN  Completed   HPV VACCINES  Aged Out    Health Maintenance  Health Maintenance Due  Topic Date Due   Zoster Vaccines-  Shingrix (1 of 2) Never done   URINE MICROALBUMIN  01/08/2020   COVID-19 Vaccine (5 - Booster for Pfizer series) 05/08/2020   OPHTHALMOLOGY EXAM  09/30/2020    Colorectal cancer screening: No longer required.   Mammogram status: No longer required due to age.  Bone Density status: Completed 01/05/2018. Results reflect: Bone density results: OSTEOPOROSIS. Repeat every 2-3 years.  Lung Cancer Screening: (Low Dose CT Chest recommended if Age 30-80 years, 30 pack-year currently smoking OR have quit w/in 15years.) does not qualify.   Lung Cancer Screening Referral: no  Additional Screening:  Hepatitis C Screening: does not qualify; Completed no  Vision Screening: Recommended annual ophthalmology exams for early detection of glaucoma and other disorders of the eye. Is the patient up  to date with their annual eye exam?  Yes  Who is the provider or what is the name of the office in which the patient attends annual eye exams? Rutherford Guys, MD. If pt is not established with a provider, would they like to be referred to a provider to establish care? No .   Dental Screening: Recommended annual dental exams for proper oral hygiene  Community Resource Referral / Chronic Care Management: CRR required this visit?  No   CCM required this visit?  No      Plan:     I have personally reviewed and noted the following in the patient's chart:   Medical and social history Use of alcohol, tobacco or illicit drugs  Current medications and supplements including opioid prescriptions.  Functional ability and status Nutritional status Physical activity Advanced directives List of other physicians Hospitalizations, surgeries, and ER visits in previous 12 months Vitals Screenings to include cognitive, depression, and falls Referrals and appointments  In addition, I have reviewed and discussed with patient certain preventive protocols, quality metrics, and best practice recommendations. A written  personalized care plan for preventive services as well as general preventive health recommendations were provided to patient.     Sheral Flow, LPN   9/[id number]/8938   Nurse Notes:  There were no vitals filed for this visit. There is no height or weight on file to calculate BMI. Medications reviewed with patient; yes opioid use noted.  Medical screening examination/treatment/procedure(s) were performed by non-physician practitioner and as supervising physician I was immediately available for consultation/collaboration.  I agree with above. Lew Dawes, MD

## 2021-08-13 ENCOUNTER — Telehealth: Payer: Self-pay | Admitting: Family Medicine

## 2021-08-13 DIAGNOSIS — B3741 Candidal cystitis and urethritis: Secondary | ICD-10-CM | POA: Diagnosis not present

## 2021-08-13 DIAGNOSIS — E119 Type 2 diabetes mellitus without complications: Secondary | ICD-10-CM | POA: Diagnosis not present

## 2021-08-13 DIAGNOSIS — M353 Polymyalgia rheumatica: Secondary | ICD-10-CM | POA: Diagnosis not present

## 2021-08-13 DIAGNOSIS — I110 Hypertensive heart disease with heart failure: Secondary | ICD-10-CM | POA: Diagnosis not present

## 2021-08-13 DIAGNOSIS — I5030 Unspecified diastolic (congestive) heart failure: Secondary | ICD-10-CM | POA: Diagnosis not present

## 2021-08-13 DIAGNOSIS — G9341 Metabolic encephalopathy: Secondary | ICD-10-CM | POA: Diagnosis not present

## 2021-08-13 NOTE — Telephone Encounter (Signed)
Called daughter [name] back to confirm the Palliative f/u visit on 08/14/21 @ 2 PM, no answer - left message requesting a return call to confirm appointment.

## 2021-08-14 ENCOUNTER — Encounter: Payer: Self-pay | Admitting: Family Medicine

## 2021-08-14 ENCOUNTER — Other Ambulatory Visit: Payer: Medicare Other | Admitting: Family Medicine

## 2021-08-14 VITALS — BP 92/60 | HR 89 | Resp 20

## 2021-08-14 DIAGNOSIS — M12812 Other specific arthropathies, not elsewhere classified, left shoulder: Secondary | ICD-10-CM | POA: Diagnosis not present

## 2021-08-14 DIAGNOSIS — N3000 Acute cystitis without hematuria: Secondary | ICD-10-CM

## 2021-08-14 NOTE — Progress Notes (Signed)
Therapist, nutritional Palliative Care Consult Note Telephone: [phone]  Fax: [phone]    Date of encounter: 08/14/21 10:07 PM PATIENT NAME: Kayla Thompson [address] [place] [address]   [phone] (home) [phone] (work) DOB: [birth year] MRN: [id number] PRIMARY CARE PROVIDER:    Myrlene Broker, MD,  [address] [place] [address] [phone]  REFERRING PROVIDER:   Myrlene Broker, MD [address] [place],  [place] [address] [phone]  RESPONSIBLE PARTY:    Contact Information     Name Relation Home Work Kayla Thompson Daughter   [phone]   Kayla Thompson Daughter [phone]     Kayla Thompson Granddaughter   [phone]        I met face to face with patient in her home. Palliative Care was asked to follow this patient by consultation request of  Kayla Thompson, * to address advance care planning and complex medical decision making. This is a follow up visit.                                   ASSESSMENT, SYMPTOM MANAGEMENT AND PLAN / RECOMMENDATIONS:   Acute cystitis Pt has completed antibiotics with mental status at baseline. Encourage increased fluid intake. Follow up with PCP for test of cure.  Rotator cuff arthropathy of bilat shoulders/OA of multiple sites Pain controlled on current regimen National back order on all strengths of Oxycodone with APAP except 10mg  /325 mg. Refilled Oxycodone 10 mg/325 mg recently but decreased frequency to po Q 8 hrs prn pain from Q 6 hrs prn. Continue Prednisone 1 mg every other day. Continue Tylenol 650 mg TID in between.    Advance Care Planning/Goals of Care: Goals include to maximize quality of life and symptom management.  CODE STATUS: DNR      Follow up Palliative Care Visit: Palliative care will continue to follow for complex medical decision making, advance care planning, and clarification of goals. Return 4  weeks or prn.   This visit was coded based on medical decision making (MDM).  PPS: 30%  HOSPICE ELIGIBILITY/DIAGNOSIS: TBD  Chief Complaint:  Palliative Care is following for chronic medical management of chronic heart failure and bronchiectasis with pain management of OA in bilateral shoulders but today following up recent hospitalization for UTI.  HISTORY OF PRESENT ILLNESS:  Kayla Thompson is a 86 y.o. year old female with  chronic pain issues related to torn rotator cuffs/fused vertebrae/hx of broken pelvis and hip.  Pt has DM, COPD with bronchiectasis, CHF, hypothyroidism, chronic UTI, colitis and neuropathy.   Denies pain if not moving otherwise has pain in bilateral shoulders, right worst. Denies CP, SOB, falls.  Good appetite, sleeping well.  Occasional issues with constipation. Drinking 2 ensures per day.  Daughter states most blood sugars are under 170. She continues on Lantus 5 units daily and says that providers have told her if she goes off Prednisone she may not need any coverage for her blood sugar.  97 states they have her down to Prednisone 1 mg every other day currently.  She states that after most recent hospitalization her mother indicated she didn't think she wanted to go back to the hospital and is understanding that this could be life-threatening with infection but was ok as she would 'go to heaven'.  Daughter states she has talked with her further about the MOST form and they are  not quite ready to complete it yet but likely would in the near future. Pt with recent UTI and encephalopathy with hospitalization of about 7 days.  She is to follow up with a test of cure specimen for the PCP.                                                      History obtained from review of EMR, discussion with daughter Kayla Thompson and/or Kayla Thompson.  I reviewed available labs, medications, imaging, studies and related documents from the EMR.  Records reviewed and summarized above.    ROS  General: NAD EYES: denies vision changes ENMT: denies dysphagia Cardiovascular: denies chest pain, denies DOE Pulmonary: denies cough, denies increased SOB Abdomen: endorses good appetite, endorses intermittent constipation, endorses continence of bowel GU: denies dysuria, endorses continence of urine MSK: pain in bilateral shoulders, worse in right with some limited ROM  no falls reported Skin: denies rashes or wounds Neurological: denies insomnia Psych: Endorses positive mood Heme/lymph/immuno: denies bruises, abnormal bleeding  Physical Exam: Current and past weights: 07/25/21 93 lbs, 87 lbs as of 04/2021 Constitutional: NAD  General: frail appearing, thin EYES: anicteric sclera, lids intact, no discharge  ENMT: hard of hearing, oral mucous membranes moist, dentition intact CV: S1S2, RRR with holosystolic grade III LUSB murmur, no LE edema, feet cool and bilateral distal foot and toes mildly cyanotic Pulmonary: CTAB, no increased work of breathing, no cough, room air Abdomen: normo-active BS + 4 quadrants, soft and non tender, no ascites IR:[id number] per  MSK: noted generalized sarcopenia, limited ROM bilateral shoulders Skin: warm and dry, no rashes or wounds on visible skin Neuro:  no generalized weakness,  some memory deficit Psych: non-anxious affect, A and O x 2 Hem/lymph/immuno: no widespread bruising   Thank you for the opportunity to participate in the care of [name].  The palliative care team will continue to follow. Please call our office at [phone] if we can be of additional assistance.   Kayla Conception, FNP -C  COVID-19 PATIENT SCREENING TOOL Asked and negative response unless otherwise noted:   Have you had symptoms of covid, tested positive or been in contact with someone with symptoms/positive test in the past 5-10 days?  No

## 2021-08-15 ENCOUNTER — Other Ambulatory Visit: Payer: Self-pay | Admitting: Internal Medicine

## 2021-08-15 DIAGNOSIS — J479 Bronchiectasis, uncomplicated: Secondary | ICD-10-CM

## 2021-08-19 ENCOUNTER — Encounter: Payer: Self-pay | Admitting: Podiatry

## 2021-08-19 ENCOUNTER — Ambulatory Visit (INDEPENDENT_AMBULATORY_CARE_PROVIDER_SITE_OTHER): Payer: Medicare Other | Admitting: Podiatry

## 2021-08-19 DIAGNOSIS — L84 Corns and callosities: Secondary | ICD-10-CM | POA: Diagnosis not present

## 2021-08-19 DIAGNOSIS — M79675 Pain in left toe(s): Secondary | ICD-10-CM | POA: Diagnosis not present

## 2021-08-19 DIAGNOSIS — M79674 Pain in right toe(s): Secondary | ICD-10-CM

## 2021-08-19 DIAGNOSIS — B351 Tinea unguium: Secondary | ICD-10-CM | POA: Diagnosis not present

## 2021-08-19 DIAGNOSIS — M2041 Other hammer toe(s) (acquired), right foot: Secondary | ICD-10-CM

## 2021-08-19 DIAGNOSIS — E1149 Type 2 diabetes mellitus with other diabetic neurological complication: Secondary | ICD-10-CM | POA: Diagnosis not present

## 2021-08-19 NOTE — Progress Notes (Signed)
This patient returns to my office for at risk foot care.  This patient requires this care by a professional since this patient will be at risk due to having type 2 DM, and PAD.  This patient is unable to cut nails herself since the patient cannot reach her nails.These nails are painful walking and wearing shoes.  This patient presents for at risk foot care today.  General Appearance  Alert, conversant and in no acute stress.  Vascular  Dorsalis pedis and posterior tibial  pulses are  weakly palpable  bilaterally.  Capillary return is within normal limits  bilaterally. Cold feet bilaterally. Purplish feet noted.  Neurologic  Senn-Weinstein monofilament wire test within normal limits  bilaterally. Muscle power within normal limits bilaterally.  Nails Thick disfigured discolored nails with subungual debris  from hallux to fifth toes bilaterally. No evidence of bacterial infection or drainage bilaterally.  Subungual hematoma right hallux.  Orthopedic  No limitations of motion  feet .  No crepitus or effusions noted.  Mild  HAV with hammer toes 2-5  B/L.  Rigid ht second right.  Skin  normotropic skin with no porokeratosis noted bilaterally.  No signs of infections or ulcers noted.     Onychomycosis  Pain in right toes  Pain in left toes  Consent was obtained for treatment procedures.   Mechanical debridement of nails 1-5  bilaterally performed with a nail nipper.  Filed with dremel without incident.   Return office visit    3 months                  Told patient to return for periodic foot care and evaluation due to potential at risk complications.   Gardiner Barefoot DPM

## 2021-08-20 DIAGNOSIS — I110 Hypertensive heart disease with heart failure: Secondary | ICD-10-CM | POA: Diagnosis not present

## 2021-08-20 DIAGNOSIS — E119 Type 2 diabetes mellitus without complications: Secondary | ICD-10-CM | POA: Diagnosis not present

## 2021-08-20 DIAGNOSIS — I5030 Unspecified diastolic (congestive) heart failure: Secondary | ICD-10-CM | POA: Diagnosis not present

## 2021-08-20 DIAGNOSIS — M353 Polymyalgia rheumatica: Secondary | ICD-10-CM | POA: Diagnosis not present

## 2021-08-20 DIAGNOSIS — B3741 Candidal cystitis and urethritis: Secondary | ICD-10-CM | POA: Diagnosis not present

## 2021-08-20 DIAGNOSIS — G9341 Metabolic encephalopathy: Secondary | ICD-10-CM | POA: Diagnosis not present

## 2021-08-22 DIAGNOSIS — M353 Polymyalgia rheumatica: Secondary | ICD-10-CM | POA: Diagnosis not present

## 2021-08-22 DIAGNOSIS — G9341 Metabolic encephalopathy: Secondary | ICD-10-CM | POA: Diagnosis not present

## 2021-08-22 DIAGNOSIS — E119 Type 2 diabetes mellitus without complications: Secondary | ICD-10-CM | POA: Diagnosis not present

## 2021-08-22 DIAGNOSIS — B3741 Candidal cystitis and urethritis: Secondary | ICD-10-CM | POA: Diagnosis not present

## 2021-08-22 DIAGNOSIS — I110 Hypertensive heart disease with heart failure: Secondary | ICD-10-CM | POA: Diagnosis not present

## 2021-08-22 DIAGNOSIS — I5030 Unspecified diastolic (congestive) heart failure: Secondary | ICD-10-CM | POA: Diagnosis not present

## 2021-08-28 DIAGNOSIS — I110 Hypertensive heart disease with heart failure: Secondary | ICD-10-CM | POA: Diagnosis not present

## 2021-08-28 DIAGNOSIS — I5030 Unspecified diastolic (congestive) heart failure: Secondary | ICD-10-CM | POA: Diagnosis not present

## 2021-08-28 DIAGNOSIS — G9341 Metabolic encephalopathy: Secondary | ICD-10-CM | POA: Diagnosis not present

## 2021-08-28 DIAGNOSIS — B3741 Candidal cystitis and urethritis: Secondary | ICD-10-CM | POA: Diagnosis not present

## 2021-08-28 DIAGNOSIS — M353 Polymyalgia rheumatica: Secondary | ICD-10-CM | POA: Diagnosis not present

## 2021-08-28 DIAGNOSIS — E119 Type 2 diabetes mellitus without complications: Secondary | ICD-10-CM | POA: Diagnosis not present

## 2021-08-29 DIAGNOSIS — G9341 Metabolic encephalopathy: Secondary | ICD-10-CM | POA: Diagnosis not present

## 2021-08-29 DIAGNOSIS — E119 Type 2 diabetes mellitus without complications: Secondary | ICD-10-CM | POA: Diagnosis not present

## 2021-08-29 DIAGNOSIS — B3741 Candidal cystitis and urethritis: Secondary | ICD-10-CM | POA: Diagnosis not present

## 2021-08-29 DIAGNOSIS — M353 Polymyalgia rheumatica: Secondary | ICD-10-CM | POA: Diagnosis not present

## 2021-08-29 DIAGNOSIS — I110 Hypertensive heart disease with heart failure: Secondary | ICD-10-CM | POA: Diagnosis not present

## 2021-08-29 DIAGNOSIS — I5030 Unspecified diastolic (congestive) heart failure: Secondary | ICD-10-CM | POA: Diagnosis not present

## 2021-09-01 DIAGNOSIS — B3741 Candidal cystitis and urethritis: Secondary | ICD-10-CM | POA: Diagnosis not present

## 2021-09-01 DIAGNOSIS — G9341 Metabolic encephalopathy: Secondary | ICD-10-CM | POA: Diagnosis not present

## 2021-09-01 DIAGNOSIS — I110 Hypertensive heart disease with heart failure: Secondary | ICD-10-CM | POA: Diagnosis not present

## 2021-09-01 DIAGNOSIS — E119 Type 2 diabetes mellitus without complications: Secondary | ICD-10-CM | POA: Diagnosis not present

## 2021-09-01 DIAGNOSIS — I5030 Unspecified diastolic (congestive) heart failure: Secondary | ICD-10-CM | POA: Diagnosis not present

## 2021-09-01 DIAGNOSIS — M353 Polymyalgia rheumatica: Secondary | ICD-10-CM | POA: Diagnosis not present

## 2021-09-04 DIAGNOSIS — B3741 Candidal cystitis and urethritis: Secondary | ICD-10-CM | POA: Diagnosis not present

## 2021-09-04 DIAGNOSIS — M810 Age-related osteoporosis without current pathological fracture: Secondary | ICD-10-CM | POA: Diagnosis not present

## 2021-09-04 DIAGNOSIS — K219 Gastro-esophageal reflux disease without esophagitis: Secondary | ICD-10-CM | POA: Diagnosis not present

## 2021-09-04 DIAGNOSIS — J449 Chronic obstructive pulmonary disease, unspecified: Secondary | ICD-10-CM | POA: Diagnosis not present

## 2021-09-04 DIAGNOSIS — M19011 Primary osteoarthritis, right shoulder: Secondary | ICD-10-CM | POA: Diagnosis not present

## 2021-09-04 DIAGNOSIS — M353 Polymyalgia rheumatica: Secondary | ICD-10-CM | POA: Diagnosis not present

## 2021-09-04 DIAGNOSIS — E559 Vitamin D deficiency, unspecified: Secondary | ICD-10-CM | POA: Diagnosis not present

## 2021-09-04 DIAGNOSIS — I5030 Unspecified diastolic (congestive) heart failure: Secondary | ICD-10-CM | POA: Diagnosis not present

## 2021-09-04 DIAGNOSIS — Z792 Long term (current) use of antibiotics: Secondary | ICD-10-CM | POA: Diagnosis not present

## 2021-09-04 DIAGNOSIS — D72829 Elevated white blood cell count, unspecified: Secondary | ICD-10-CM | POA: Diagnosis not present

## 2021-09-04 DIAGNOSIS — M19012 Primary osteoarthritis, left shoulder: Secondary | ICD-10-CM | POA: Diagnosis not present

## 2021-09-04 DIAGNOSIS — D509 Iron deficiency anemia, unspecified: Secondary | ICD-10-CM | POA: Diagnosis not present

## 2021-09-04 DIAGNOSIS — E785 Hyperlipidemia, unspecified: Secondary | ICD-10-CM | POA: Diagnosis not present

## 2021-09-04 DIAGNOSIS — G8929 Other chronic pain: Secondary | ICD-10-CM | POA: Diagnosis not present

## 2021-09-04 DIAGNOSIS — M47814 Spondylosis without myelopathy or radiculopathy, thoracic region: Secondary | ICD-10-CM | POA: Diagnosis not present

## 2021-09-04 DIAGNOSIS — F32A Depression, unspecified: Secondary | ICD-10-CM | POA: Diagnosis not present

## 2021-09-04 DIAGNOSIS — Z8701 Personal history of pneumonia (recurrent): Secondary | ICD-10-CM | POA: Diagnosis not present

## 2021-09-04 DIAGNOSIS — E119 Type 2 diabetes mellitus without complications: Secondary | ICD-10-CM | POA: Diagnosis not present

## 2021-09-04 DIAGNOSIS — F419 Anxiety disorder, unspecified: Secondary | ICD-10-CM | POA: Diagnosis not present

## 2021-09-04 DIAGNOSIS — M797 Fibromyalgia: Secondary | ICD-10-CM | POA: Diagnosis not present

## 2021-09-04 DIAGNOSIS — J9611 Chronic respiratory failure with hypoxia: Secondary | ICD-10-CM | POA: Diagnosis not present

## 2021-09-04 DIAGNOSIS — I110 Hypertensive heart disease with heart failure: Secondary | ICD-10-CM | POA: Diagnosis not present

## 2021-09-04 DIAGNOSIS — Z794 Long term (current) use of insulin: Secondary | ICD-10-CM | POA: Diagnosis not present

## 2021-09-04 DIAGNOSIS — G9341 Metabolic encephalopathy: Secondary | ICD-10-CM | POA: Diagnosis not present

## 2021-09-04 DIAGNOSIS — E039 Hypothyroidism, unspecified: Secondary | ICD-10-CM | POA: Diagnosis not present

## 2021-09-05 DIAGNOSIS — M353 Polymyalgia rheumatica: Secondary | ICD-10-CM | POA: Diagnosis not present

## 2021-09-05 DIAGNOSIS — B3741 Candidal cystitis and urethritis: Secondary | ICD-10-CM | POA: Diagnosis not present

## 2021-09-05 DIAGNOSIS — I110 Hypertensive heart disease with heart failure: Secondary | ICD-10-CM | POA: Diagnosis not present

## 2021-09-05 DIAGNOSIS — I5030 Unspecified diastolic (congestive) heart failure: Secondary | ICD-10-CM | POA: Diagnosis not present

## 2021-09-05 DIAGNOSIS — G9341 Metabolic encephalopathy: Secondary | ICD-10-CM | POA: Diagnosis not present

## 2021-09-05 DIAGNOSIS — E119 Type 2 diabetes mellitus without complications: Secondary | ICD-10-CM | POA: Diagnosis not present

## 2021-09-07 ENCOUNTER — Other Ambulatory Visit: Payer: Self-pay | Admitting: Internal Medicine

## 2021-09-07 DIAGNOSIS — E039 Hypothyroidism, unspecified: Secondary | ICD-10-CM

## 2021-09-08 DIAGNOSIS — I110 Hypertensive heart disease with heart failure: Secondary | ICD-10-CM | POA: Diagnosis not present

## 2021-09-08 DIAGNOSIS — G9341 Metabolic encephalopathy: Secondary | ICD-10-CM | POA: Diagnosis not present

## 2021-09-08 DIAGNOSIS — M353 Polymyalgia rheumatica: Secondary | ICD-10-CM | POA: Diagnosis not present

## 2021-09-08 DIAGNOSIS — I5030 Unspecified diastolic (congestive) heart failure: Secondary | ICD-10-CM | POA: Diagnosis not present

## 2021-09-08 DIAGNOSIS — B3741 Candidal cystitis and urethritis: Secondary | ICD-10-CM | POA: Diagnosis not present

## 2021-09-08 DIAGNOSIS — E119 Type 2 diabetes mellitus without complications: Secondary | ICD-10-CM | POA: Diagnosis not present

## 2021-09-09 ENCOUNTER — Ambulatory Visit (INDEPENDENT_AMBULATORY_CARE_PROVIDER_SITE_OTHER): Payer: Medicare Other

## 2021-09-09 DIAGNOSIS — M818 Other osteoporosis without current pathological fracture: Secondary | ICD-10-CM | POA: Diagnosis not present

## 2021-09-09 MED ORDER — DENOSUMAB 60 MG/ML ~~LOC~~ SOSY
60.0000 mg | PREFILLED_SYRINGE | Freq: Once | SUBCUTANEOUS | Status: AC
  Administered 2021-09-09: 60 mg via SUBCUTANEOUS

## 2021-09-09 NOTE — Progress Notes (Signed)
Prolia Last Adminsiterd 05/19/2020 Administered Prolia 60 mg/ml Sub Q to right upper arm, ordered by PCP.  Patient tolerated injection will.

## 2021-09-10 DIAGNOSIS — I5030 Unspecified diastolic (congestive) heart failure: Secondary | ICD-10-CM | POA: Diagnosis not present

## 2021-09-10 DIAGNOSIS — I110 Hypertensive heart disease with heart failure: Secondary | ICD-10-CM | POA: Diagnosis not present

## 2021-09-10 DIAGNOSIS — B3741 Candidal cystitis and urethritis: Secondary | ICD-10-CM | POA: Diagnosis not present

## 2021-09-10 DIAGNOSIS — E119 Type 2 diabetes mellitus without complications: Secondary | ICD-10-CM | POA: Diagnosis not present

## 2021-09-10 DIAGNOSIS — M353 Polymyalgia rheumatica: Secondary | ICD-10-CM | POA: Diagnosis not present

## 2021-09-10 DIAGNOSIS — G9341 Metabolic encephalopathy: Secondary | ICD-10-CM | POA: Diagnosis not present

## 2021-09-15 DIAGNOSIS — I110 Hypertensive heart disease with heart failure: Secondary | ICD-10-CM | POA: Diagnosis not present

## 2021-09-15 DIAGNOSIS — E119 Type 2 diabetes mellitus without complications: Secondary | ICD-10-CM | POA: Diagnosis not present

## 2021-09-15 DIAGNOSIS — M353 Polymyalgia rheumatica: Secondary | ICD-10-CM | POA: Diagnosis not present

## 2021-09-15 DIAGNOSIS — G9341 Metabolic encephalopathy: Secondary | ICD-10-CM | POA: Diagnosis not present

## 2021-09-15 DIAGNOSIS — I5030 Unspecified diastolic (congestive) heart failure: Secondary | ICD-10-CM | POA: Diagnosis not present

## 2021-09-15 DIAGNOSIS — B3741 Candidal cystitis and urethritis: Secondary | ICD-10-CM | POA: Diagnosis not present

## 2021-09-19 DIAGNOSIS — B3741 Candidal cystitis and urethritis: Secondary | ICD-10-CM | POA: Diagnosis not present

## 2021-09-19 DIAGNOSIS — I5030 Unspecified diastolic (congestive) heart failure: Secondary | ICD-10-CM | POA: Diagnosis not present

## 2021-09-19 DIAGNOSIS — I110 Hypertensive heart disease with heart failure: Secondary | ICD-10-CM | POA: Diagnosis not present

## 2021-09-19 DIAGNOSIS — E119 Type 2 diabetes mellitus without complications: Secondary | ICD-10-CM | POA: Diagnosis not present

## 2021-09-19 DIAGNOSIS — M353 Polymyalgia rheumatica: Secondary | ICD-10-CM | POA: Diagnosis not present

## 2021-09-19 DIAGNOSIS — G9341 Metabolic encephalopathy: Secondary | ICD-10-CM | POA: Diagnosis not present

## 2021-09-23 DIAGNOSIS — I5030 Unspecified diastolic (congestive) heart failure: Secondary | ICD-10-CM | POA: Diagnosis not present

## 2021-09-23 DIAGNOSIS — I110 Hypertensive heart disease with heart failure: Secondary | ICD-10-CM | POA: Diagnosis not present

## 2021-09-23 DIAGNOSIS — M353 Polymyalgia rheumatica: Secondary | ICD-10-CM | POA: Diagnosis not present

## 2021-09-23 DIAGNOSIS — B3741 Candidal cystitis and urethritis: Secondary | ICD-10-CM | POA: Diagnosis not present

## 2021-09-23 DIAGNOSIS — E119 Type 2 diabetes mellitus without complications: Secondary | ICD-10-CM | POA: Diagnosis not present

## 2021-09-23 DIAGNOSIS — G9341 Metabolic encephalopathy: Secondary | ICD-10-CM | POA: Diagnosis not present

## 2021-09-25 DIAGNOSIS — I5030 Unspecified diastolic (congestive) heart failure: Secondary | ICD-10-CM | POA: Diagnosis not present

## 2021-09-25 DIAGNOSIS — G9341 Metabolic encephalopathy: Secondary | ICD-10-CM | POA: Diagnosis not present

## 2021-09-25 DIAGNOSIS — E119 Type 2 diabetes mellitus without complications: Secondary | ICD-10-CM | POA: Diagnosis not present

## 2021-09-25 DIAGNOSIS — B3741 Candidal cystitis and urethritis: Secondary | ICD-10-CM | POA: Diagnosis not present

## 2021-09-25 DIAGNOSIS — M353 Polymyalgia rheumatica: Secondary | ICD-10-CM | POA: Diagnosis not present

## 2021-09-25 DIAGNOSIS — I110 Hypertensive heart disease with heart failure: Secondary | ICD-10-CM | POA: Diagnosis not present

## 2021-09-30 DIAGNOSIS — B3741 Candidal cystitis and urethritis: Secondary | ICD-10-CM | POA: Diagnosis not present

## 2021-09-30 DIAGNOSIS — M353 Polymyalgia rheumatica: Secondary | ICD-10-CM | POA: Diagnosis not present

## 2021-09-30 DIAGNOSIS — G9341 Metabolic encephalopathy: Secondary | ICD-10-CM | POA: Diagnosis not present

## 2021-09-30 DIAGNOSIS — I110 Hypertensive heart disease with heart failure: Secondary | ICD-10-CM | POA: Diagnosis not present

## 2021-09-30 DIAGNOSIS — I5030 Unspecified diastolic (congestive) heart failure: Secondary | ICD-10-CM | POA: Diagnosis not present

## 2021-09-30 DIAGNOSIS — E119 Type 2 diabetes mellitus without complications: Secondary | ICD-10-CM | POA: Diagnosis not present

## 2021-10-03 DIAGNOSIS — G9341 Metabolic encephalopathy: Secondary | ICD-10-CM | POA: Diagnosis not present

## 2021-10-03 DIAGNOSIS — E119 Type 2 diabetes mellitus without complications: Secondary | ICD-10-CM | POA: Diagnosis not present

## 2021-10-03 DIAGNOSIS — I110 Hypertensive heart disease with heart failure: Secondary | ICD-10-CM | POA: Diagnosis not present

## 2021-10-03 DIAGNOSIS — I5030 Unspecified diastolic (congestive) heart failure: Secondary | ICD-10-CM | POA: Diagnosis not present

## 2021-10-03 DIAGNOSIS — M353 Polymyalgia rheumatica: Secondary | ICD-10-CM | POA: Diagnosis not present

## 2021-10-03 DIAGNOSIS — B3741 Candidal cystitis and urethritis: Secondary | ICD-10-CM | POA: Diagnosis not present

## 2021-10-08 ENCOUNTER — Encounter: Payer: Self-pay | Admitting: Emergency Medicine

## 2021-10-08 ENCOUNTER — Ambulatory Visit (INDEPENDENT_AMBULATORY_CARE_PROVIDER_SITE_OTHER): Payer: Medicare Other | Admitting: Emergency Medicine

## 2021-10-08 VITALS — BP 132/82 | HR 102 | Temp 98.1°F | Ht 59.0 in | Wt 105.1 lb

## 2021-10-08 DIAGNOSIS — J471 Bronchiectasis with (acute) exacerbation: Secondary | ICD-10-CM

## 2021-10-08 DIAGNOSIS — R051 Acute cough: Secondary | ICD-10-CM | POA: Diagnosis not present

## 2021-10-08 DIAGNOSIS — J22 Unspecified acute lower respiratory infection: Secondary | ICD-10-CM | POA: Insufficient documentation

## 2021-10-08 MED ORDER — AZITHROMYCIN 250 MG PO TABS: ORAL_TABLET | ORAL | 0 refills | Status: DC

## 2021-10-08 NOTE — Assessment & Plan Note (Signed)
With history of bronchiectasis.  Will benefit from azithromycin daily for 5 days.

## 2021-10-08 NOTE — Progress Notes (Signed)
Kayla Thompson 86 y.o.   Chief Complaint  Patient presents with   Cough    Cough , congestion in her chest    HISTORY OF PRESENT ILLNESS: Acute problem visit today.  Patient of Dr. Sharlet Salina This is a 86 y.o. female complaining of flulike symptoms and cough that started 2 weeks ago progressively getting worse.  Now cough is productive of yellow phlegm. No fever or chills.  Accompanied by caregiver.  No difficulty breathing. Able to eat and drink.  No nausea or vomiting. No other associated symptoms. No other complaints or medical concerns today.  Cough Pertinent negatives include no chest pain, chills, fever, headaches, rash, sore throat, shortness of breath or wheezing.     Prior to Admission medications   Medication Sig Start Date End Date Taking? Authorizing Provider  Ascorbic Acid (VITAMIN C) 1000 MG tablet Take 1,000 mg by mouth in the morning and at bedtime.   Yes [provider]  azithromycin (ZITHROMAX) 250 MG tablet Sig as indicated 10/08/21  Yes Dakarri Kessinger, Ines Bloomer, MD  BD PEN NEEDLE NANO 2ND GEN 32G X 4 MM MISC USE AS DIRECTED 4 TIMES A DAY 10/22/20  Yes [name], MD  Cyanocobalamin (VITAMIN B 12 PO) Take 2,500 mcg by mouth every Thursday.   Yes [provider]  Dextromethorphan-guaiFENesin (MUCINEX DM MAXIMUM STRENGTH) 60-1200 MG TB12 Take 1 tablet by mouth in the morning and at bedtime.   Yes [provider]  docusate sodium (COLACE) 100 MG capsule 1 capsule as needed   Yes [provider]  DULoxetine (CYMBALTA) 30 MG capsule TAKE 1 CAPSULE BY MOUTH EVERY DAY Patient taking differently: Take 30 mg by mouth daily. Patient takes with 60 mg for a total dose of 90 mg 05/05/21  Yes [name], MD  estradiol (ESTRACE) 0.1 MG/GM vaginal cream Place 1 Applicatorful vaginally See admin instructions. Takes 2 times a week 06/28/20  Yes [provider]  estradiol (ESTRACE) 0.5 MG tablet 1 tablet   Yes [provider]  feeding supplement (ENSURE ENLIVE / ENSURE PLUS) LIQD Take 237 mLs by mouth 2 (two) times daily between meals. Patient taking differently: Take 237 mLs by mouth 3 (three) times daily with meals. 01/16/20  Yes Hongalgi, Lenis Dickinson, MD  FREESTYLE LITE test strip USE TO TEST BLOOD SUGAR 3 TIMES DAILY 06/04/21  Yes Philemon Kingdom, MD  gabapentin (NEURONTIN) 100 MG capsule 1 capsule   Yes [provider]  insulin glargine (LANTUS) 100 UNIT/ML Solostar Pen Inject 5 Units into the skin in the morning.   Yes [provider]  ipratropium-albuterol (DUONEB) 0.5-2.5 (3) MG/3ML SOLN Inhale 3 mLs into the lungs every 4 (four) hours as needed. 06/26/21  Yes Young, Tarri Fuller D, MD  Lancets (FREESTYLE) lancets Use to test blood sugar 3 times daily. Dx: E11.65 10/14/16  Yes Philemon Kingdom, MD  levothyroxine (SYNTHROID) 50 MCG tablet TAKE 1 TABLET BY MOUTH EVERY DAY BEFORE BREAKFAST 09/08/21  Yes [name], MD  linaclotide [facility]) 72 MCG capsule Take 1 capsule (72 mcg total) by mouth daily before breakfast. 03/11/21  Yes Biagio Borg, MD  methenamine (MANDELAMINE) 1 g tablet Take 1,000 mg by mouth 2 (two) times daily.   Yes [provider]  mirtazapine (REMERON) 15 MG tablet 1 tablet at bedtime.   Yes [provider]  naloxone (NARCAN) nasal spray 4 mg/0.1 mL Place 1 spray into the nose once as needed (overdose). 06/27/20  Yes [provider]  nystatin cream (MYCOSTATIN) SMARTSIG:1 sparingly Topical Twice Daily 06/26/21  Yes [provider]  omeprazole (PRILOSEC) 20 MG capsule Take 1 capsule (20 mg total) by mouth 2 (two) times daily before a meal. 04/28/21  Yes Mansouraty, Telford Nab., MD  oxyCODONE-acetaminophen (PERCOCET) 7.5-325 MG tablet Take 1 tablet by mouth See admin instructions. Daughter states can take up to 5 times daily as needed   Yes [provider]  polyethylene glycol (MIRALAX / GLYCOLAX) 17 g packet Take 17 g by mouth  2 (two) times daily. Decrease to daily or every other day pending on bowel movement frequency/consistency 04/14/21  Yes Mariel Aloe, MD  predniSONE (DELTASONE) 1 MG tablet Take 1 tablet (1 mg total) by mouth in the morning and at bedtime. Patient taking differently: Take 1 mg by mouth every other day. This week  its Tuesday,Thursday & Saturday 03/31/21  Yes Kathie Dike, MD  rosuvastatin (CRESTOR) 10 MG tablet TAKE 1 TABLET BY MOUTH EVERY DAY IN THE EVENING 05/05/21  Yes [name], MD  TRELEGY ELLIPTA 100-62.5-25 MCG/ACT AEPB INHALE 1 PUFF INTO THE LUNGS DAILY. RINSE MOUTH 08/18/21  Yes Young, Clinton D, MD  triamcinolone cream (KENALOG) 0.1 % PLEASE SEE ATTACHED FOR DETAILED DIRECTIONS 06/26/21  Yes [provider]  TURMERIC PO Take 1,000 mg by mouth in the morning.   Yes [provider]  cholecalciferol (VITAMIN D3) 25 MCG (1000 UNIT) tablet Take 1,000 Units by mouth daily. Patient not taking: Reported on 07/25/2021    [provider]    Allergies  Allergen Reactions   Tape Other (See Comments)    SKIN IS VERY THIN AND DELICATE- will tear like paper!!   Lasix [Furosemide] Other (See Comments)    Confusion- MD stopped this med   Latex Rash   Penicillins Rash    Has patient had a PCN reaction causing immediate rash, facial/tongue/throat swelling, SOB or lightheadedness with hypotension: No Has patient had a PCN reaction causing severe rash involving mucus membranes or skin necrosis: No Has patient had a PCN reaction that required hospitalization: No Has patient had a PCN reaction occurring within the last 10 years: No If all of the above answers are "NO", then may proceed with Cephalosporin use.     Patient Active Problem List   Diagnosis Date Noted   Acute cystitis without hematuria 08/14/2021   Chronic respiratory failure with hypoxia ([place]) 07/28/2021   Anemia of chronic disease 07/28/2021   Macrocytosis 78/58/8502   Acute metabolic  encephalopathy 77/41/2878   Urinary tract infection due to extended-spectrum beta lactamase (ESBL) producing Escherichia coli 07/25/2021   Vulvovaginal candidiasis 06/27/2021   Palliative care encounter 04/26/2021   Fecal impaction ([place]) 04/12/2021   Hypokalemia 04/12/2021   Positive blood culture 04/03/2021   Constipation 03/11/2021   Weight loss 03/11/2021   COPD (chronic obstructive pulmonary disease) ([place]) 11/20/2020   Other chronic pain    Hypertension associated with diabetes ([place]) 05/07/2020   Hyperlipidemia associated with type 2 diabetes mellitus ([place]) 05/07/2020   Depression 05/07/2020   PAD (peripheral artery disease) ([place]) 02/05/2020   Upper esophageal web 03/27/2019   Iron deficiency anemia 03/27/2019   Candida esophagitis ([place]) 02/14/2019   UTI (urinary tract infection) 01/25/2019   Yeast cystitis 01/25/2019   Encounter for general adult medical examination with abnormal findings 01/09/2019   History of colonic polyps 12/28/2018   Dysphagia 12/28/2018   Diabetic foot ulcer ([place]) 12/05/2018   Rotator cuff arthropathy, left 11/16/2018   Degenerative arthritis of  left knee 11/16/2018   Insomnia 09/26/2018   Leukocytosis 10/17/2017   Severe protein-calorie malnutrition ([place]) 10/10/2017   Memory changes 07/29/2017   Granulomatous lung disease ([place]) 06/14/2017   Tracheobronchomalacia 06/14/2017   Dizziness 03/04/2017   Exercise hypoxemia 07/12/2016   Chronic heart failure with preserved ejection fraction ([place]) 12/25/2015   GERD (gastroesophageal reflux disease) 12/25/2015   Epidermal inclusion cyst 09/02/2015   Muscle cramps 03/06/2015   Cough 01/07/2015   Bronchiectasis without acute exacerbation ([place]) 01/07/2015   PMR (polymyalgia rheumatica) ([place]) 01/07/2015   Osteoarthritis 01/07/2015   Asthma, chronic 11/12/2014   Anxiety state 11/12/2014   Hyperparathyroidism ([place]) 10/28/2014   Fibromyalgia 09/21/2014   Personal history of ovarian cancer 08/23/2014    Osteoporosis 08/23/2014   DM2 (diabetes mellitus, type 2) ([place]) 08/02/2014   HLD (hyperlipidemia) 08/02/2014   Hypothyroidism 08/02/2014    Past Medical History:  Diagnosis Date   Anxiety    Arthritis    Asthma    Cataracts, bilateral    removed by surgery   COPD (chronic obstructive pulmonary disease) ([place])    Depression    Diabetes mellitus without complication ([place])    type 2   Dyspnea    with exertion   Fibromyalgia    GERD (gastroesophageal reflux disease)    History of chemotherapy    History of fractured pelvis    Hypertension    Hypothyroidism    Osteoporosis 12/2017   T score -2.8 stable from prior DEXA   Osteoporosis    Ovarian cancer ([place])    Pneumonia    several times    Past Surgical History:  Procedure Laterality Date   ABDOMINAL HYSTERECTOMY  1996   ANKLE FRACTURE SURGERY     plate and screws   BIOPSY  02/05/2019   Procedure: BIOPSY;  Surgeon: Irving Copas., MD;  Location[facility] ENDOSCOPY;  Service: Gastroenterology;;   BLADDER SUSPENSION     CATARACT EXTRACTION     COLONOSCOPY     ESOPHAGEAL BRUSHING  02/05/2019   Procedure: ESOPHAGEAL BRUSHING;  Surgeon: Irving Copas., MD;  Location: [facility] ENDOSCOPY;  Service: Gastroenterology;;   ESOPHAGOGASTRODUODENOSCOPY (EGD) WITH PROPOFOL N/A 02/05/2019   Procedure: ESOPHAGOGASTRODUODENOSCOPY (EGD) WITH PROPOFOL with dialtion;  Surgeon: Irving Copas., MD;  Location: [facility] ENDOSCOPY;  Service: Gastroenterology;  Laterality: N/A;   EYE SURGERY     cataracts removed-bilateral   hip surgey     knee surgey     LUNG SURGERY     OOPHORECTOMY     BSO   SAVORY DILATION N/A 02/05/2019   Procedure: SAVORY DILATION;  Surgeon: Irving Copas., MD;  Location: [place];  Service: Gastroenterology;  Laterality: N/A;   TONSILLECTOMY     UPPER GI ENDOSCOPY      Social History   Socioeconomic History   Marital status: Widowed    Spouse name: Not on file   Number of children: 2    Years of education: Not on file   Highest education level: Not on file  Occupational History   Not on file  Tobacco Use   Smoking status: Never    Passive exposure: Never   Smokeless tobacco: Never  Vaping Use   Vaping Use: Never used  Substance and Sexual Activity   Alcohol use: No    Alcohol/week: 0.0 standard drinks of alcohol   Drug use: No   Sexual activity: Not Currently    Birth control/protection: Post-menopausal    Comment: declined sexual Hx questions  Other Topics Concern  Not on file  Social History Narrative   Not on file   Social Determinants of Health   Financial Resource Strain: Low Risk  (08/11/2021)   Overall Financial Resource Strain (CARDIA)    Difficulty of Paying Living Expenses: Not hard at all  Food Insecurity: No Food Insecurity (08/11/2021)   Hunger Vital Sign    Worried About Running Out of Food in the Last Year: Never true    Ran Out of Food in the Last Year: Never true  Transportation Needs: No Transportation Needs (08/11/2021)   PRAPARE - Hydrologist (Medical): No    Lack of Transportation (Non-Medical): No  Physical Activity: Inactive (08/11/2021)   Exercise Vital Sign    Days of Exercise per Week: 0 days    Minutes of Exercise per Session: 0 min  Stress: No Stress Concern Present (08/11/2021)   [place]    Feeling of Stress : Not at all  Social Connections: Moderately Integrated (08/11/2021)   Social Connection and Isolation Panel [NHANES]    Frequency of Communication with Friends and Family: More than three times a week    Frequency of Social Gatherings with Friends and Family: More than three times a week    Attends Religious Services: 1 to 4 times per year    Active Member of [employer] or Organizations: Yes    Attends Archivist Meetings: 1 to 4 times per year    Marital Status: Widowed  Intimate Partner Violence: Not At Risk  (08/11/2021)   Humiliation, Afraid, Rape, and Kick questionnaire    Fear of Current or Ex-Partner: No    Emotionally Abused: No    Physically Abused: No    Sexually Abused: No    Family History  Problem Relation Age of Onset   Lung cancer Mother    CAD Father    Diabetes Father    Lung cancer Maternal Aunt    Diabetes Paternal Aunt    Diabetes Paternal Uncle    Diabetes Paternal Grandmother    Diabetes Paternal Grandfather    Colon cancer Neg Hx    Esophageal cancer Neg Hx    Inflammatory bowel disease Neg Hx    Liver disease Neg Hx    Pancreatic cancer Neg Hx    Rectal cancer Neg Hx    Stomach cancer Neg Hx      Review of Systems  Constitutional: Negative.  Negative for chills and fever.  HENT:  Positive for congestion. Negative for sore throat.   Respiratory:  Positive for cough and sputum production. Negative for shortness of breath and wheezing.   Cardiovascular:  Negative for chest pain and palpitations.  Gastrointestinal: Negative.  Negative for abdominal pain, diarrhea, nausea and vomiting.  Genitourinary: Negative.   Skin: Negative.  Negative for rash.  Neurological:  Negative for dizziness and headaches.  All other systems reviewed and are negative.  Today's Vitals   10/08/21 1040  BP: 132/82  Pulse: (!) 102  Temp: 98.1 F (36.7 C)  TempSrc: Oral  SpO2: 95%  Weight: 105 lb 2 oz (47.7 kg)  Height: '4\' 11"'$  (1.499 m)   Body mass index is 21.23 kg/m.   Physical Exam Vitals reviewed.  Constitutional:      Appearance: Normal appearance.  HENT:     Head: Normocephalic.     Mouth/Throat:     Mouth: Mucous membranes are moist.     Pharynx: Oropharynx is clear.  Eyes:     Extraocular Movements: Extraocular movements intact.     Pupils: Pupils are equal, round, and reactive to light.  Cardiovascular:     Rate and Rhythm: Normal rate and regular rhythm.     Pulses: Normal pulses.     Heart sounds: Normal heart sounds.  Pulmonary:     Breath sounds:  Rhonchi present.  Abdominal:     Palpations: Abdomen is soft.     Tenderness: There is no abdominal tenderness.  Musculoskeletal:     Cervical back: No tenderness.  Lymphadenopathy:     Cervical: No cervical adenopathy.  Skin:    General: Skin is warm and dry.     Capillary Refill: Capillary refill takes less than 2 seconds.  Neurological:     General: No focal deficit present.     Mental Status: She is alert and oriented to person, place, and time.  Psychiatric:        Mood and Affect: Mood normal.        Behavior: Behavior normal.      ASSESSMENT & PLAN: A total of 46 minutes was spent with the patient and counseling/coordination of care regarding preparing for this visit, review of most recent office visit notes, review of multiple chronic medical problems under management, review of all medications, diagnosis of bronchiectasis with acute lower respiratory infection, need for antibiotics, prognosis, documentation and need for follow-up.  Problem List Items Addressed This Visit       Respiratory   Bronchiectasis with (acute) exacerbation ([place]) - Primary    Phlegm recently changed color.  Chest congestion on exam. Will benefit from antibiotics. Start azithromycin daily for 5 days.      Lower respiratory infection    With history of bronchiectasis.  Will benefit from azithromycin daily for 5 days.      Relevant Medications   azithromycin (ZITHROMAX) 250 MG tablet     Other   Cough   Patient Instructions  Acute Bronchitis, Adult  Acute bronchitis is when air tubes in the lungs (bronchi) suddenly get swollen. The condition can make it hard for you to breathe. In adults, acute bronchitis usually goes away within 2 weeks. A cough caused by bronchitis may last up to 3 weeks. Smoking, allergies, and asthma can make the condition worse. What are the causes? Germs that cause cold and flu (viruses). The most common cause of this condition is the virus that causes the common  cold. Bacteria. Substances that bother (irritate) the lungs, including: Smoke from cigarettes and other types of tobacco. Dust and pollen. Fumes from chemicals, gases, or burned fuel. Indoor or outdoor air pollution. What increases the risk? A weak body's defense system. This is also called the immune system. Any condition that affects your lungs and breathing, such as asthma. What are the signs or symptoms? A cough. Coughing up clear, yellow, or green mucus. Making high-pitched whistling sounds when you breathe, most often when you breathe out (wheezing). Runny or stuffy nose. Having too much mucus in your lungs (chest congestion). Shortness of breath. Body aches. A sore throat. How is this treated? Acute bronchitis may go away over time without treatment. Your doctor may tell you to: Drink more fluids. This will help thin your mucus so it is easier to cough up. Use a device that gets medicine into your lungs (inhaler). Use a vaporizer or a humidifier. These are machines that add water to the air. This helps with coughing and poor breathing. Take a medicine  that thins mucus and helps clear it from your lungs. Take a medicine that prevents or stops coughing. It is not common to take an antibiotic medicine for this condition. Follow these instructions at home:  Take over-the-counter and prescription medicines only as told by your doctor. Use an inhaler, vaporizer, or humidifier as told by your doctor. Take two teaspoons (10 mL) of honey at bedtime. This helps lessen your coughing at night. Drink enough fluid to keep your pee (urine) pale yellow. Do not smoke or use any products that contain nicotine or tobacco. If you need help quitting, ask your doctor. Get a lot of rest. Return to your normal activities when your doctor says that it is safe. Keep all follow-up visits. How is this prevented?  Wash your hands often with soap and water for at least 20 seconds. If you cannot use  soap and water, use hand sanitizer. Avoid contact with people who have cold symptoms. Try not to touch your mouth, nose, or eyes with your hands. Avoid breathing in smoke or chemical fumes. Make sure to get the flu shot every year. Contact a doctor if: Your symptoms do not get better in 2 weeks. You have trouble coughing up the mucus. Your cough keeps you awake at night. You have a fever. Get help right away if: You cough up blood. You have chest pain. You have very bad shortness of breath. You faint or keep feeling like you are going to faint. You have a very bad headache. Your fever or chills get worse. These symptoms may be an emergency. Get help right away. Call your local emergency services (911 in the [place] Do not wait to see if the symptoms will go away. Do not drive yourself to the hospital. Summary Acute bronchitis is when air tubes in the lungs (bronchi) suddenly get swollen. In adults, acute bronchitis usually goes away within 2 weeks. Drink more fluids. This will help thin your mucus so it is easier to cough up. Take over-the-counter and prescription medicines only as told by your doctor. Contact a doctor if your symptoms do not improve after 2 weeks of treatment. This information is not intended to replace advice given to you by your health care provider. Make sure you discuss any questions you have with your health care provider. Document Revised: 07/09/2020 Document Reviewed: 07/09/2020 Elsevier Patient Education  [place], MD [facility]

## 2021-10-08 NOTE — Patient Instructions (Signed)
  Acute Bronchitis, Adult  Acute bronchitis is when air tubes in the lungs (bronchi) suddenly get swollen. The condition can make it hard for you to breathe. In adults, acute bronchitis usually goes away within 2 weeks. A cough caused by bronchitis may last up to 3 weeks. Smoking, allergies, and asthma can make the condition worse. What are the causes? Germs that cause cold and flu (viruses). The most common cause of this condition is the virus that causes the common cold. Bacteria. Substances that bother (irritate) the lungs, including: Smoke from cigarettes and other types of tobacco. Dust and pollen. Fumes from chemicals, gases, or burned fuel. Indoor or outdoor air pollution. What increases the risk? A weak body's defense system. This is also called the immune system. Any condition that affects your lungs and breathing, such as asthma. What are the signs or symptoms? A cough. Coughing up clear, yellow, or green mucus. Making high-pitched whistling sounds when you breathe, most often when you breathe out (wheezing). Runny or stuffy nose. Having too much mucus in your lungs (chest congestion). Shortness of breath. Body aches. A sore throat. How is this treated? Acute bronchitis may go away over time without treatment. Your doctor may tell you to: Drink more fluids. This will help thin your mucus so it is easier to cough up. Use a device that gets medicine into your lungs (inhaler). Use a vaporizer or a humidifier. These are machines that add water to the air. This helps with coughing and poor breathing. Take a medicine that thins mucus and helps clear it from your lungs. Take a medicine that prevents or stops coughing. It is not common to take an antibiotic medicine for this condition. Follow these instructions at home:  Take over-the-counter and prescription medicines only as told by your doctor. Use an inhaler, vaporizer, or humidifier as told by your doctor. Take two  teaspoons (10 mL) of honey at bedtime. This helps lessen your coughing at night. Drink enough fluid to keep your pee (urine) pale yellow. Do not smoke or use any products that contain nicotine or tobacco. If you need help quitting, ask your doctor. Get a lot of rest. Return to your normal activities when your doctor says that it is safe. Keep all follow-up visits. How is this prevented?  Wash your hands often with soap and water for at least 20 seconds. If you cannot use soap and water, use hand sanitizer. Avoid contact with people who have cold symptoms. Try not to touch your mouth, nose, or eyes with your hands. Avoid breathing in smoke or chemical fumes. Make sure to get the flu shot every year. Contact a doctor if: Your symptoms do not get better in 2 weeks. You have trouble coughing up the mucus. Your cough keeps you awake at night. You have a fever. Get help right away if: You cough up blood. You have chest pain. You have very bad shortness of breath. You faint or keep feeling like you are going to faint. You have a very bad headache. Your fever or chills get worse. These symptoms may be an emergency. Get help right away. Call your local emergency services (911 in the [place] Do not wait to see if the symptoms will go away. Do not drive yourself to the hospital. Summary Acute bronchitis is when air tubes in the lungs (bronchi) suddenly get swollen. In adults, acute bronchitis usually goes away within 2 weeks. Drink more fluids. This will help thin your mucus so it   is easier to cough up. Take over-the-counter and prescription medicines only as told by your doctor. Contact a doctor if your symptoms do not improve after 2 weeks of treatment. This information is not intended to replace advice given to you by your health care provider. Make sure you discuss any questions you have with your health care provider. Document Revised: 07/09/2020 Document Reviewed: 07/09/2020 Elsevier  Patient Education  2023 Elsevier Inc.  

## 2021-10-08 NOTE — Assessment & Plan Note (Signed)
Phlegm recently changed color.  Chest congestion on exam. Will benefit from antibiotics. Start azithromycin daily for 5 days.

## 2021-10-12 ENCOUNTER — Other Ambulatory Visit: Payer: Medicare Other | Admitting: Family Medicine

## 2021-10-12 ENCOUNTER — Encounter: Payer: Self-pay | Admitting: Family Medicine

## 2021-10-12 VITALS — HR 66 | Resp 16

## 2021-10-12 DIAGNOSIS — M158 Other polyosteoarthritis: Secondary | ICD-10-CM

## 2021-10-12 DIAGNOSIS — M12812 Other specific arthropathies, not elsewhere classified, left shoulder: Secondary | ICD-10-CM

## 2021-10-12 DIAGNOSIS — B3731 Acute candidiasis of vulva and vagina: Secondary | ICD-10-CM

## 2021-10-12 DIAGNOSIS — J471 Bronchiectasis with (acute) exacerbation: Secondary | ICD-10-CM | POA: Diagnosis not present

## 2021-10-12 NOTE — Progress Notes (Signed)
[facility] Palliative Care Consult Note Telephone: [phone]  Fax: [phone]    Date of encounter: 10/12/21 4:19 PM PATIENT NAME: Kayla Thompson [place] [id number]   [phone] (home) [phone] (work) DOB: December 10, 1935 MRN: [id number] PRIMARY CARE PROVIDER:    [name], MD,  [place] [address] [phone]  REFERRING PROVIDER:   [name], MD [address] [place],   [address] [phone]  RESPONSIBLE PARTY:    Contact Information     Name Relation Home Work Qulin Daughter   [phone]   Gardiner Ramus Daughter [phone]     alexander,Brittany Granddaughter   [phone]        I met face to face with patient in her home. Palliative Care was asked to follow this patient by consultation request of  [name], * to address advance care planning and complex medical decision making. This is a follow up visit.                                   ASSESSMENT, SYMPTOM MANAGEMENT AND PLAN / RECOMMENDATIONS:   Bronchiectasis with exacerbation Resolving, completed Azithromycin pack today.  Rotator cuff arthropathy of bilat shoulders/OA of multiple sites Pain not as well controlled, wears off early Was changed to higher dose OxyCodone with national back order recently and frequency decreased. Resume prior schedule which was working for patient- Oxycodone 7.5 mg/300 mg Q 6 hrs prn. Continue Prednisone 1 mg every other day.  3.  Vulvovaginal Candidiasis On recent antibiotic therapy. Finished Azithromycin today. Fluconazole 100 mg daily x 3. Continue to leave off brief during the day to allow air circulation. Continues on topical estrogen replacement therapy.    Advance Care Planning/Goals of Care: Goals include to maximize quality of life and symptom management.  CODE STATUS: DNR      Follow up Palliative Care  Visit: Palliative care will continue to follow for complex medical decision making, advance care planning, and clarification of goals. Return 4 weeks or prn.   This visit was coded based on medical decision making (MDM).  PPS: 40%  HOSPICE ELIGIBILITY/DIAGNOSIS: TBD  Chief Complaint:  Palliative Care is following for chronic medical management of chronic heart failure and recent COPD exacerbation with pain management of OA in bilateral shoulders.  HISTORY OF PRESENT ILLNESS:  Kayla Thompson is a 86 y.o. year old female with  chronic pain issues related to torn rotator cuffs/fused vertebrae/hx of broken pelvis and hip.  Pt has DM, COPD with bronchiectasis, CHF, hypothyroidism, chronic UTI, colitis and neuropathy.   Endorses pain in bilateral shoulders, right worst. She has had some coughing and denies worsening SOB above baseline, not needing O2. Cough was persistent and then productive of yellow sputum.  She just completed her Azithromycin course today.  Denies CP, falls.  Good appetite, sleeping well.  Occasional issues with constipation, managed with Miralax 3 times weekly in addition to Sennokot. Currently off Linzess due to being in the donut hole and cost of $450/month.  Fasting blood sugars 126-178.  Drinking 2 ensures per day and has gained some weight.  Daughter states most blood sugars are under 170. She continues on Lantus 5 units daily.  She continues on Prednisone 1 mg every other day currently.  She states her vaginal tissue has been bright red, irritated and painful so they had stopped  having her wear a brief all day and attempt every 1-2 hours to have her toilet.  Urine was cloudy but was difficult for caregivers to say if it was truly the urine or urine that had some contamination from barrier protective creams used on her bottom.                                              History obtained from review of EMR, discussion with daughter [name], paid caregiver and/or Ms. Mcnally.     Recently followed up with Dr Mitchel Honour on 10/08/21: Treated her for bronchiectasis with exacerbation and no other significant changes.    I reviewed EMR with no new available labs, imaging or studies.  Records reviewed and summarized above.   ROS General: NAD Cardiovascular: denies chest pain, denies DOE Pulmonary: improved cough, denies increased SOB Abdomen: endorses good appetite, endorses intermittent constipation, endorses continence of bowel GU: endorses improved dysuria, endorses continence of urine MSK: pain in bilateral shoulders  no falls reported Skin: painful sensitivity and redness of vulvovaginal area Neurological: denies insomnia Psych: Endorses positive mood  Physical Exam: Current and past weights: 07/25/21 93 lbs, 105 lbs  2oz as of 10/08/21 Constitutional: NAD  General: thin CV: S1S2, RRR with holosystolic grade III LUSB murmur, no LE edema, no noted cyanosis Pulmonary: intermittent coarse bronchovesicular sounds but no rales or rhonchi, no increased work of breathing, no cough, room air Abdomen: normo-active BS + 4 quadrants, soft and non tender MSK: noted generalized sarcopenia, limited ROM bilateral shoulders Skin: warm and dry, no rashes or wounds on visible skin Neuro:  no generalized weakness Psych: non-anxious affect, A and O x 2    Thank you for the opportunity to participate in the care of [name].  The palliative care team will continue to follow. Please call our office at [phone] if we can be of additional assistance.   Marijo Conception, FNP -C  COVID-19 PATIENT SCREENING TOOL Asked and negative response unless otherwise noted:   Have you had symptoms of covid, tested positive or been in contact with someone with symptoms/positive test in the past 5-10 days?  No

## 2021-10-19 DIAGNOSIS — N760 Acute vaginitis: Secondary | ICD-10-CM | POA: Diagnosis not present

## 2021-10-19 DIAGNOSIS — N3020 Other chronic cystitis without hematuria: Secondary | ICD-10-CM | POA: Diagnosis not present

## 2021-10-27 ENCOUNTER — Ambulatory Visit: Payer: Medicare Other | Admitting: Internal Medicine

## 2021-10-27 NOTE — Progress Notes (Deleted)
Patient ID: Kayla Thompson, female   DOB: February 01, 1936, 86 y.o.   MRN: [id number]  HPI  Kayla Thompson is a 86 y.o.-year-old female, initially referred by her OB/GYN doctor, Dr. Uvaldo Rising, for evaluation for hyperparathyroidism + normo/hypo-calcemia, vitamin D deficiency,  and DM2, dx 2010, insulin-dependent since 2010, controlled, w/o long-term complications.  Last visit 4 months ago.   At today's visit, she is here with her daughter.  She offers all the info about the medications, diet, activity, and recent hospitalizations..  Interim history: No increased urination, blurry vision, nausea, chest pain. No falls or fractures since last visit. She was recently admitted 07/25/2021 for acute encephalopathy.  She is feeling better.  Reviewed history: Pt was dx with hyperparathyroidism in 08/2014.  Reviewed pertinent labs: Lab Results  Component Value Date   PTH 37 06/09/2015   PTH Comment 06/09/2015   PTH 73 (H) 10/28/2014   PTH Comment 10/28/2014   PTH 190 (H) 09/10/2014   PTH 243 (H) 08/23/2014   CALCIUM 8.9 07/28/2021   CALCIUM 9.0 07/27/2021   CALCIUM 8.9 07/26/2021   CALCIUM 9.8 07/25/2021   CALCIUM 10.1 04/20/2021   CALCIUM 9.1 04/13/2021   CALCIUM 9.0 04/12/2021   CALCIUM 8.2 (L) 04/11/2021   CALCIUM 9.3 03/31/2021   CALCIUM 9.0 03/29/2021   Her osteoporosis was diagnosed in 2007.  She had few falls.  Reviewed previous DXA scan report: Date L1-L2 (L3 and L4 excluded) T score FN T score  01/05/2018 [facility])  +0.4  LFN: -2.8  07/25/2014   -0.1  LFN: -2.7  Right hip could not be analyzed due to previous instrumentation.  She has a history of multiple fractures in: - 09/23/1990: pelvic fx - 02/2005: R hip fx - rod - 05/2006: rib fx - 12/2008: 3 R ankle fx - in [place] - plate and screws - 78/2956: wrist fx's 01/2012: steroid inj's R knee - 07/2014: forearm fx and nasal bone fx - 09/2019: Chest x-ray: Age-indeterminate fractures of the third and fourth  ribs  She started Prolia in 2017.   Per PCP. She is tolerating this well.  Last dose 09/09/2021.  No h/o kidney stones.  No CKD. Last BUN/Cr: Lab Results  Component Value Date   BUN 15 07/28/2021   CREATININE 0.64 07/28/2021   She is not on HCTZ.  No FH of hypercalcemia, pituitary tumors, thyroid cancer, or osteoporosis.   Vitamin D level:   Lab Results  Component Value Date   VD25OH >120 04/20/2021   VD25OH 89.5 06/12/2020   VD25OH 101.47 (HH) 02/19/2020   VD25OH 99.3 12/14/2019   VD25OH 72.23 10/12/2018   VD25OH 81.19 06/09/2015   VD25OH 65.60 01/28/2015   VD25OH 48 08/23/2014  - we decreased the dose of vitamin D from 4000 to 2000 units in 11/2019.  At last visit she was on 1000 units.  A 24-hour urine calcium was normal: Component     Latest Ref Rng 03/31/2015  Calcium, Ur     Not estab mg/dL 9  Calcium, 24 hour urine     35 - 250 mg/24 h 108  Creatinine, Urine     20 - 320 mg/dL 78  Creatinine, 24H Ur     0.63 - 2.50 g/24 h 0.94   DM2: - dx 2010 >> she started insulin and diagnosis.  Reviewed HbA1c levels: Lab Results  Component Value Date   HGBA1C 6.5 (H) 03/27/2021   HGBA1C 6.4 (A) 12/18/2020   HGBA1C 5.6 07/07/2020   She  is on: - Lantus 30 >> 25 >> 20 >> 15 >> 11 >> 8 >> 5  units midday Previously on: Humalog 4-6 >> Novolog 4 units before breakfast only >> stopped 09/2018 >> restarted Humalog 2 to 4 units before dinner >> stopped.  She checks sugars 2x a day: - am: 48, 52, 63-136, 169 >> 74, 112-187, 218 >> 98, 114-150, 168 - after lunch: 120 >> 116-154 >> n/c  - after dinner: 132 >> 70 >> n/c - bedtime: 84-190s >> 89-256 >> 174, 193-337 >> 130-197 Lowest: 91 >> 84 >> 74 >> 98 Highest: 194 >> 190s >> 337 >> 197.  -+ HL; latest lipids:  Lab Results  Component Value Date   CHOL 132 10/16/2019   HDL 55 10/16/2019   LDLCALC 57 10/16/2019   TRIG 114 10/16/2019   CHOLHDL 2.4 10/16/2019  On Crestor 10.  + Mild CKD;. Latest BUN/Cr: Lab Results   Component Value Date   BUN 15 07/28/2021   Lab Results  Component Value Date   CREATININE 0.64 07/28/2021   Last dilated eye exam: 11/13/2020: No DR; Dr Gershon Crane.  She has a history of chalazion, has a history of cataract surgery.  Last foot exam 08/19/2021 by Dr. Prudence Davidson -podiatry.  She also has a history of Ovarian cancer - 1996, urinary incontinence, HTN.  She has a history of hypothyroidism: Lab Results  Component Value Date   TSH 0.580 04/12/2021   TSH 0.118 (L) 03/27/2021   TSH 1.342 01/08/2021   TSH 0.681 05/07/2020   TSH 0.77 02/19/2020   TSH 0.76 10/16/2019   TSH 0.110 (L) 01/27/2019   TSH 0.85 10/12/2018   TSH 0.23 (L) 12/01/2016   TSH 0.61 07/15/2016   TSH 0.61 10/21/2015   TSH 0.36 03/03/2015   TSH 0.591 08/02/2014   She is very reticent to decrease the dose of levothyroxine due to fear of hair loss.  At last visit she was on levothyroxine 50 mcg daily.  She had instances when TSH was suppressed and I advised her to decrease the dose to 25 g daily.  She did not do so.  Pt takes the levothyroxine: - in am - fasting - at least 30 min from b'fast  - + Ca, Fe, MVI, PPIs  >4h after levothyroxine - not on Biotin  Pt denies: - feeling nodules in neck - hoarseness - choking - SOB with lying down But does have chronic dysphagia.  ROS:  Cardiovascular: no CP/no SOB/no palpitations/+ leg swelling Neurological: + tremors/no numbness/no tingling/no dizziness  I reviewed pt's medications, allergies, PMH, social hx, family hx, and changes were documented in the history of present illness. Otherwise, unchanged from my initial visit note.  Past Medical History:  Diagnosis Date   Anxiety    Arthritis    Asthma    Cataracts, bilateral    removed by surgery   COPD (chronic obstructive pulmonary disease) ([place])    Depression    Diabetes mellitus without complication ([place])    type 2   Dyspnea    with exertion   Fibromyalgia    GERD (gastroesophageal reflux  disease)    History of chemotherapy    History of fractured pelvis    Hypertension    Hypothyroidism    Osteoporosis 12/2017   T score -2.8 stable from prior DEXA   Osteoporosis    Ovarian cancer ([place])    Pneumonia    several times   Past Surgical History:  Procedure Laterality Date   ABDOMINAL  HYSTERECTOMY  1996   ANKLE FRACTURE SURGERY     plate and screws   BIOPSY  02/05/2019   Procedure: BIOPSY;  Surgeon: Rush Landmark Telford Nab., MD;  Location: [facility];  Service: Gastroenterology;;   BLADDER SUSPENSION     CATARACT EXTRACTION     COLONOSCOPY     ESOPHAGEAL BRUSHING  02/05/2019   Procedure: ESOPHAGEAL BRUSHING;  Surgeon: Irving Copas., MD;  Location: [facility];  Service: Gastroenterology;;   ESOPHAGOGASTRODUODENOSCOPY (EGD) WITH PROPOFOL N/A 02/05/2019   Procedure: ESOPHAGOGASTRODUODENOSCOPY (EGD) WITH PROPOFOL with dialtion;  Surgeon: Irving Copas., MD;  Location: [facility];  Service: Gastroenterology;  Laterality: N/A;   EYE SURGERY     cataracts removed-bilateral   hip surgey     knee surgey     LUNG SURGERY     OOPHORECTOMY     BSO   SAVORY DILATION N/A 02/05/2019   Procedure: SAVORY DILATION;  Surgeon: Irving Copas., MD;  Location: [facility];  Service: Gastroenterology;  Laterality: N/A;   TONSILLECTOMY     UPPER GI ENDOSCOPY     History   Social History   Marital Status: Widowed    Spouse Name: N/A   Number of Children: 2 (1 adopted)   Occupational History   retired   Social History Main Topics   Smoking status: Never Smoker    Smokeless tobacco: Never Used   Alcohol Use: No   Drug Use: No   Current Outpatient Medications on File Prior to Visit  Medication Sig Dispense Refill   Ascorbic Acid (VITAMIN C) 1000 MG tablet Take 1,000 mg by mouth in the morning and at bedtime.     azithromycin (ZITHROMAX) 250 MG tablet Sig as indicated 6 tablet 0   BD PEN NEEDLE NANO 2ND GEN 32G X 4 MM MISC USE AS DIRECTED 4  TIMES A DAY 100 each 5   cholecalciferol (VITAMIN D3) 25 MCG (1000 UNIT) tablet Take 1,000 Units by mouth daily. (Patient not taking: Reported on 07/25/2021)     Cyanocobalamin (VITAMIN B 12 PO) Take 2,500 mcg by mouth every Thursday.     Dextromethorphan-guaiFENesin (MUCINEX DM MAXIMUM STRENGTH) 60-1200 MG TB12 Take 1 tablet by mouth in the morning and at bedtime.     docusate sodium (COLACE) 100 MG capsule 1 capsule as needed     DULoxetine (CYMBALTA) 30 MG capsule TAKE 1 CAPSULE BY MOUTH EVERY DAY (Patient taking differently: Take 30 mg by mouth daily. Patient takes with 60 mg for a total dose of 90 mg) 90 capsule 3   estradiol (ESTRACE) 0.1 MG/GM vaginal cream Place 1 Applicatorful vaginally See admin instructions. Takes 2 times a week     feeding supplement (ENSURE ENLIVE / ENSURE PLUS) LIQD Take 237 mLs by mouth 2 (two) times daily between meals. (Patient taking differently: Take 237 mLs by mouth 3 (three) times daily with meals.) 237 mL 12   FREESTYLE LITE test strip USE TO TEST BLOOD SUGAR 3 TIMES DAILY 100 strip 5   gabapentin (NEURONTIN) 100 MG capsule 1 capsule     insulin glargine (LANTUS) 100 UNIT/ML Solostar Pen Inject 5 Units into the skin in the morning.     ipratropium-albuterol (DUONEB) 0.5-2.5 (3) MG/3ML SOLN Inhale 3 mLs into the lungs every 4 (four) hours as needed. 540 mL 11   Lancets (FREESTYLE) lancets Use to test blood sugar 3 times daily. Dx: E11.65 100 each 3   levothyroxine (SYNTHROID) 50 MCG tablet TAKE 1 TABLET BY MOUTH EVERY DAY  BEFORE BREAKFAST 90 tablet 1   linaclotide (LINZESS) 72 MCG capsule Take 1 capsule (72 mcg total) by mouth daily before breakfast. 90 capsule 3   methenamine (MANDELAMINE) 1 g tablet Take 1,000 mg by mouth 2 (two) times daily.     mirtazapine (REMERON) 15 MG tablet 1 tablet at bedtime.     naloxone (NARCAN) nasal spray 4 mg/0.1 mL Place 1 spray into the nose once as needed (overdose).     nystatin cream (MYCOSTATIN) SMARTSIG:1 sparingly Topical  Twice Daily     omeprazole (PRILOSEC) 20 MG capsule Take 1 capsule (20 mg total) by mouth 2 (two) times daily before a meal. 60 capsule 2   oxyCODONE-acetaminophen (PERCOCET) 7.5-325 MG tablet Take 1 tablet by mouth See admin instructions. Daughter states can take up to 5 times daily as needed     polyethylene glycol (MIRALAX / GLYCOLAX) 17 g packet Take 17 g by mouth 2 (two) times daily. Decrease to daily or every other day pending on bowel movement frequency/consistency 30 each 0   predniSONE (DELTASONE) 1 MG tablet Take 1 tablet (1 mg total) by mouth in the morning and at bedtime. (Patient taking differently: Take 1 mg by mouth every other day. This week  its Tuesday,Thursday & Saturday)     rosuvastatin (CRESTOR) 10 MG tablet TAKE 1 TABLET BY MOUTH EVERY DAY IN THE EVENING 90 tablet 3   TRELEGY ELLIPTA 100-62.5-25 MCG/ACT AEPB INHALE 1 PUFF INTO THE LUNGS DAILY. RINSE MOUTH 60 each 11   triamcinolone cream (KENALOG) 0.1 % PLEASE SEE ATTACHED FOR DETAILED DIRECTIONS     TURMERIC PO Take 1,000 mg by mouth in the morning.     No current facility-administered medications on file prior to visit.   Allergies  Allergen Reactions   Tape Other (See Comments)    SKIN IS VERY THIN AND DELICATE- will tear like paper!!   Lasix [Furosemide] Other (See Comments)    Confusion- MD stopped this med   Latex Rash   Penicillins Rash    Has patient had a PCN reaction causing immediate rash, facial/tongue/throat swelling, SOB or lightheadedness with hypotension: No Has patient had a PCN reaction causing severe rash involving mucus membranes or skin necrosis: No Has patient had a PCN reaction that required hospitalization: No Has patient had a PCN reaction occurring within the last 10 years: No If all of the above answers are "NO", then may proceed with Cephalosporin use.    Family History  Problem Relation Age of Onset   Lung cancer Mother    CAD Father    Diabetes Father    Lung cancer Maternal Aunt     Diabetes Paternal Aunt    Diabetes Paternal Uncle    Diabetes Paternal Grandmother    Diabetes Paternal Grandfather    Colon cancer Neg Hx    Esophageal cancer Neg Hx    Inflammatory bowel disease Neg Hx    Liver disease Neg Hx    Pancreatic cancer Neg Hx    Rectal cancer Neg Hx    Stomach cancer Neg Hx    PE: LMP  (LMP Unknown)   Wt Readings from Last 3 Encounters:  10/08/21 105 lb 2 oz (47.7 kg)  07/25/21 93 lb (42.2 kg)  05/13/21 87 lb (39.5 kg)   Constitutional: normal weight, in NAD Eyes: EOMI, no exophthalmos ENT: moist mucous membranes, no thyromegaly, no cervical lymphadenopathy Cardiovascular: tachycardia, RR, No MRG, + bilateral lower extremity edema Respiratory: CTA B Musculoskeletal: + finger deformities (  [place] and Bouchard nodules) Skin: moist, warm, no rashes Neurological: + tremor with outstretched hands  Assessment: 1. H/o Hyperparathyroidism - recent normocalcemia  2. Vitamin D def  3. DM2, insulin-dependent, controlled, without complications  4. Low TSH  5.  Osteoporosis  Plan: 1.  Secondary HPTH and 2. Vit D def -Patient with history of hyperparathyroidism in the setting of low calcium, with PTH levels normalizing after correction of calcium levels.  She continues on Prolia injections, calcium, vitamin D -She has a history of elevated vitamin D levels in the past, on supplementation.  However, afterwards, she decreased the dose of vitamin D and at last visit she was taking 1000 units daily -Since then, she had another vitamin D obtained 04/20/2021 and this was again very high: >120 -  3. DM2 - Patient with longstanding, uncontrolled, type 2 diabetes, on basal/bolus insulin regimen in the past, but currently only on low-dose basal insulin.  At last visit, she was living with her daughter, who was taking care of her and sugars improved significantly.  She was off Humalog and I advised her to continue but only use it if the sugars increase to upper  200s or 300s.  HbA1c before last visit was excellent, at 6.5%.  - I advised her to  Patient Instructions  Please continue: - Lantus 5 units mid-day  Continue levothyroxine 50 mcg daily.  Take the thyroid hormone every day, with water, at least 30 minutes before breakfast, separated by at least 4 hours from: - acid reflux medications - calcium - iron - multivitamins  Continue vitamin D 1000 units daily.  Please come back for a follow-up appointment in 6 months.  - advised to check sugars at different times of the day - 3x a day, rotating check times - advised for yearly eye exams >> she is UTD - return to clinic in 4 months  4.  Hypothyroidism - latest thyroid labs reviewed with pt. >> normal: Lab Results  Component Value Date   TSH 0.580 04/12/2021  - she continues on LT4 50 mcg daily - pt feels good on this dose. - we discussed about taking the thyroid hormone every day, with water, >30 minutes before breakfast, separated by >4 hours from acid reflux medications, calcium, iron, multivitamins. Pt. is taking it correctly. - will check thyroid tests today: TSH and fT4 - If labs are abnormal, she will need to return for repeat TFTs in 1.5 months  5.  Osteoporosis -Reviewed her bone density results from 12/2017: Left femoral neck T score was -2.8, only slightly lower compared to 2016, when he was -2.6.  The right femoral neck could not be analyzed due to hardware. -She continues Prolia in PCPs office-tolerated well -In 2021 she had a chest x-ray which showed age indeterminate rib fractures of the third and fourth ribs.  Also, a left hip x-ray showed chronic fracture deformities in the bilateral superior and inferior pubic rami (these were previously known). -She is now in palliative care, I would not necessarily repeat the DXA scans - per PCP  Philemon Kingdom, MD PhD [facility] Endocrinology

## 2021-11-11 ENCOUNTER — Other Ambulatory Visit: Payer: Medicare Other | Admitting: Family Medicine

## 2021-11-11 VITALS — BP 132/84 | HR 90 | Resp 16

## 2021-11-11 DIAGNOSIS — E43 Unspecified severe protein-calorie malnutrition: Secondary | ICD-10-CM

## 2021-11-11 DIAGNOSIS — M158 Other polyosteoarthritis: Secondary | ICD-10-CM | POA: Diagnosis not present

## 2021-11-11 DIAGNOSIS — M12812 Other specific arthropathies, not elsewhere classified, left shoulder: Secondary | ICD-10-CM

## 2021-11-12 ENCOUNTER — Encounter: Payer: Self-pay | Admitting: Family Medicine

## 2021-11-12 NOTE — Progress Notes (Signed)
[facility] Consult Note Telephone: [phone]  Fax: [phone]    Date of encounter: 11/11/21 4:30 PM  PATIENT NAME: Kayla Thompson [place] [address]   [phone] (home) [phone] (work) DOB: 07-02-1935 MRN: [id number] PRIMARY CARE PROVIDER:    [name], MD,  [place] [address] [phone]  REFERRING PROVIDER:   [name], MD [address] [place],   [address] [phone]  RESPONSIBLE PARTY:    Contact Information     Name Relation Home Work [place] Daughter   [phone]   Gardiner Ramus Daughter [phone]     alexander,Brittany Granddaughter   [phone]        I met face to face with patient, paid caregiver and daughter [name] in her home. Palliative Care was asked to follow this patient by consultation request of  [name], * to address advance care planning and complex medical decision making. This is a follow up visit.                                   ASSESSMENT, SYMPTOM MANAGEMENT AND PLAN / RECOMMENDATIONS:   Rotator cuff arthropathy of bilat shoulders/OA of multiple sites Agreed with daughter to use Tylenol in between pain meds to extend her pain relief. Continue Prednisone 1 mg every other day. Has OxyCodone/acetaminophen 10 mg/325 mg Q 4-6 hours, using appropriately.  2.  Severe Protein Calorie Malnutrition Weight is stable. Encouraged continued use of protein supplements.    Advance Care Planning/Goals of Care: Goals include to maximize quality of life and symptom management.  CODE STATUS: DNR      Follow up Palliative Care Visit: Palliative care will continue to follow for complex medical decision making, advance care planning, and clarification of goals. Return 4 weeks or prn.   This visit was coded based on medical decision making (MDM).  PPS: 40%  HOSPICE  ELIGIBILITY/DIAGNOSIS: TBD  Chief Complaint:  Palliative Care is following for chronic medical management of chronic heart failure and recent COPD exacerbation with pain management of OA in bilateral shoulders.  HISTORY OF PRESENT ILLNESS:  Kayla Thompson is a 86 y.o. year old female with  chronic pain issues related to torn rotator cuffs/fused vertebrae/hx of broken pelvis and hip.  Pt has DM, COPD with bronchiectasis, CHF, hypothyroidism, chronic UTI, colitis and neuropathy.    Fasting blood sugars with isolated 80s and low 200s.  Most of her blood sugars are between 100-170s.  Drinking ensure.  Daughter and caregiver states that when she is gone back to [place] for her short periods to return home that the patient manipulates evening caregivers to give her soda, sweets and junkfood, stays up late and has started requesting her Tylenol with her pain meds and using OTC cough meds extensively so on her return it takes a little time to get her back to her normal routine. Daughter states she had cloudy urine and had been prescribed Fluconazole 150 mg daily x 7 days with good resolution.  Candidiasis of skin has improved. She has been told to use A & D ointment and the stronger steroid ointment they prescribed. Daughter states that pt has been expressing lately intermittently knowing when she needs to void and using the bathroom over going in her brief.  History obtained from review of EMR, discussion with daughter [name], paid caregiver and/or  Ms. Encarnacion.     I reviewed EMR with no new available labs, imaging or studies.    ROS General: NAD Cardiovascular: denies chest pain, denies DOE Pulmonary: improved cough, denies increased SOB Abdomen: endorses good appetite, endorses intermittent constipation, endorses continence of bowel GU: denies dysuria, endorses intermittent continence of urine MSK: pain in bilateral shoulders  no falls reported Skin: No rashes or wounds Neurological: denies  insomnia Psych: Endorses positive mood  Physical Exam: Current and past weights: 105 lbs  2 oz as of 10/08/21 Constitutional: NAD  General: thin CV: S1S2, RRR with holosystolic grade III LUSB murmur, no LE edema, some duskiness of distal bilat feet Pulmonary: CTAB no increased work of breathing, no cough, room air Abdomen: normo-active BS + 4 quadrants, soft and non tender MSK: noted generalized sarcopenia, limited ROM bilateral shoulders Skin: warm and dry, no rashes or wounds on visible skin Neuro:  no generalized weakness Psych: non-anxious affect, A and O x 3    Thank you for the opportunity to participate in the care of Kayla Thompson.  The palliative care team will continue to follow. Please call our office at [phone] if we can be of additional assistance.   Marijo Conception, FNP -C  COVID-19 PATIENT SCREENING TOOL Asked and negative response unless otherwise noted:   Have you had symptoms of covid, tested positive or been in contact with someone with symptoms/positive test in the past 5-10 days?  No

## 2021-11-19 DIAGNOSIS — M1990 Unspecified osteoarthritis, unspecified site: Secondary | ICD-10-CM | POA: Diagnosis not present

## 2021-11-19 DIAGNOSIS — M7500 Adhesive capsulitis of unspecified shoulder: Secondary | ICD-10-CM | POA: Diagnosis not present

## 2021-11-19 DIAGNOSIS — Z7952 Long term (current) use of systemic steroids: Secondary | ICD-10-CM | POA: Diagnosis not present

## 2021-11-19 DIAGNOSIS — M79643 Pain in unspecified hand: Secondary | ICD-10-CM | POA: Diagnosis not present

## 2021-11-19 DIAGNOSIS — M2550 Pain in unspecified joint: Secondary | ICD-10-CM | POA: Diagnosis not present

## 2021-11-19 DIAGNOSIS — M25519 Pain in unspecified shoulder: Secondary | ICD-10-CM | POA: Diagnosis not present

## 2021-11-19 DIAGNOSIS — Z8739 Personal history of other diseases of the musculoskeletal system and connective tissue: Secondary | ICD-10-CM | POA: Diagnosis not present

## 2021-11-19 DIAGNOSIS — M797 Fibromyalgia: Secondary | ICD-10-CM | POA: Diagnosis not present

## 2021-11-19 DIAGNOSIS — M810 Age-related osteoporosis without current pathological fracture: Secondary | ICD-10-CM | POA: Diagnosis not present

## 2021-11-22 ENCOUNTER — Other Ambulatory Visit: Payer: Self-pay | Admitting: Gastroenterology

## 2021-11-24 ENCOUNTER — Ambulatory Visit: Payer: Medicare Other | Admitting: Podiatry

## 2021-11-25 DIAGNOSIS — N952 Postmenopausal atrophic vaginitis: Secondary | ICD-10-CM | POA: Diagnosis not present

## 2021-11-25 DIAGNOSIS — N3020 Other chronic cystitis without hematuria: Secondary | ICD-10-CM | POA: Diagnosis not present

## 2021-12-04 ENCOUNTER — Other Ambulatory Visit: Payer: Self-pay | Admitting: Internal Medicine

## 2021-12-10 ENCOUNTER — Other Ambulatory Visit: Payer: Medicare Other | Admitting: Family Medicine

## 2021-12-10 VITALS — BP 110/72 | HR 74 | Resp 18

## 2021-12-10 DIAGNOSIS — Z794 Long term (current) use of insulin: Secondary | ICD-10-CM | POA: Diagnosis not present

## 2021-12-10 DIAGNOSIS — E43 Unspecified severe protein-calorie malnutrition: Secondary | ICD-10-CM

## 2021-12-10 DIAGNOSIS — E1165 Type 2 diabetes mellitus with hyperglycemia: Secondary | ICD-10-CM | POA: Diagnosis not present

## 2021-12-23 ENCOUNTER — Encounter: Payer: Self-pay | Admitting: *Deleted

## 2021-12-23 ENCOUNTER — Telehealth: Payer: Self-pay | Admitting: *Deleted

## 2021-12-23 NOTE — Patient Outreach (Signed)
  Care Coordination   Initial Visit Note   12/23/2021 Name: Kayla Thompson MRN: [id number] DOB: August 23, 1935  Kayla Thompson is a 86 y.o. year old female who sees Kayla Koch, MD for primary care. I spoke with caregiver/ daughter Kayla Thompson, on [place] re:  Kayla Thompson by phone today.  What matters to the patients health and wellness today?  Per caregiver/ daughter: "She is doing fine and we aren't having any issues; I plan to make sure she gets her flu vaccine.  We have private duty caregivers for her all the time.  We have no needs.  I am on vacation and can't talk right now, we will call if we have any problems"  No further or ongoing care coordination needs identified today    Goals Addressed             This Visit's Progress    COMPLETED: Care Coordination Activities: No follow up required   On track    Care Coordination Interventions: Evaluation of current treatment plan related to general health maintenance and patient's adherence to plan as established by provider Advised patient to provide appropriate vaccination information to provider or CM team member at next visit Reviewed scheduled/upcoming provider appointments including 12/29/21- podiatry; 01/15/22- endocrinology provider; 03/01/22- pulmonary provider: confirmed that caregiver providers transportation to provider office visits Assessed social determinant of health barriers Reminded caregiver to obtain annual flu vaccine for 2023-24 winter/ flu season          SDOH assessments and interventions completed:  Yes  SDOH Interventions Today    Flowsheet Row Most Recent Value  SDOH Interventions   Food Insecurity Interventions Intervention Not Indicated  Transportation Interventions Intervention Not Indicated  [caregivers/ family provide transportation]       Care Coordination Interventions Activated:  Yes  Care Coordination Interventions:  Yes, provided   Follow up plan: No further intervention  required.   Encounter Outcome:  Pt. Visit Completed   Kayla Rack, RN, BSN, CCRN Alumnus RN CM Care Coordination/ Transition of [place] Management [phone]: direct office

## 2021-12-23 NOTE — Patient Instructions (Signed)
Visit Information  Thank you for taking time to visit with me today. Please don't hesitate to contact me if I can be of assistance to you.   Following are the goals we discussed today:   Goals Addressed             This Visit's Progress    COMPLETED: Care Coordination Activities: No follow up required   On track    Care Coordination Interventions: Evaluation of current treatment plan related to general health maintenance and patient's adherence to plan as established by provider Advised patient to provide appropriate vaccination information to provider or CM team member at next visit Reviewed scheduled/upcoming provider appointments including 12/29/21- podiatry; 01/15/22- endocrinology provider; 03/01/22- pulmonary provider: confirmed that caregiver providers transportation to provider office visits Assessed social determinant of health barriers Reminded caregiver to obtain annual flu vaccine for 2023-24 winter/ flu season          If you are experiencing a [place] or [place] or need someone to talk to, please  call the Suicide and Crisis Lifeline: 988 call the [place] National Suicide Prevention Lifeline: 475-280-3770 or TTY: 731 283 1795 TTY (878)402-7377) to talk to a trained counselor call 1-800-273-TALK (toll free, 24 hour hotline) go to [facility] [address] [place] [id number]) call the [place]: [phone] call 911   Patient verbalizes understanding of instructions and care plan provided today and agrees to view in [place]. Active MyChart status and patient understanding of how to access instructions and care plan via MyChart confirmed with patient.     No further follow up required: no further or ongoing care coordination needs identified today  Oneta Rack, RN, BSN, CCRN Alumnus RN CM Care Coordination/ Transition of [place] Management [phone]: direct  office

## 2021-12-26 ENCOUNTER — Encounter: Payer: Self-pay | Admitting: Family Medicine

## 2021-12-26 NOTE — Progress Notes (Signed)
[facility] Consult Note Telephone: [phone]  Fax: [phone]    Date of encounter: 12/10/21 2:38 PM  PATIENT NAME: Kayla Thompson [place] [address]   [phone] (home) [phone] (work) DOB: 1935/06/02 MRN: [id number] PRIMARY CARE PROVIDER:    [name], MD,  [place] [address] [phone]  REFERRING PROVIDER:   [name], MD [address] [place],  [place] [address] [phone]  RESPONSIBLE PARTY:    Contact Information     Name Relation Home Work Kayla Thompson Daughter   [phone]   Kayla Thompson Daughter [phone]     Kayla Thompson,Kayla Thompson Granddaughter   [phone]        I met face to face with patient and paid caregiver in her home. Palliative Care was asked to follow this patient by consultation request of  Kayla Thompson, * to address advance care planning and complex medical decision making. This is a follow up visit.                                   ASSESSMENT, SYMPTOM MANAGEMENT AND PLAN / RECOMMENDATIONS:  1.  Severe Protein Calorie Malnutrition Weight is stable. Encouraged continued use of protein supplements.  2.  DM type 2 with hyperglycemia and use of insulin Encouraged limits of concentrated sweets  Blood sugar more well controlled during times of moderation of intake of concentrated sweets. Encourage more high protein intake   Advance Care Planning/Goals of Care: Goals include to maximize quality of life and symptom management.  CODE STATUS: DNR      Follow up Palliative Care Visit: Palliative care will continue to follow for complex medical decision making, advance care planning, and clarification of goals. Return 4 weeks or prn.   This visit was coded based on medical decision making (MDM).  PPS: 40%  HOSPICE ELIGIBILITY/DIAGNOSIS: TBD  Chief Complaint:  Palliative Care is  following for chronic medical management of chronic heart failure and recent COPD exacerbation with pain management of OA in bilateral shoulders.  HISTORY OF PRESENT ILLNESS:  Kayla Thompson is a 86 y.o. year old female with  chronic pain issues related to torn rotator cuffs/fused vertebrae/hx of broken pelvis and hip.  Pt has DM, COPD with bronchiectasis, CHF, hypothyroidism, chronic UTI, colitis and neuropathy.    Fasting blood sugars controlled in the 90s-140s range. Continues drinking ensure.  Caregiver states that pt had 4 bowls of ice cream in the overnight period and provided soda. AM fasting sugar today in the 250 range.  She was up until late in the overnight hours and has been sleepy with poor intake during the day. Bilateral shoulder pain controlled with current pain med regimen.    History obtained from review of EMR, discussion with daughter Kayla Thompson by phone, paid caregiver and/or Kayla Thompson.     I reviewed EMR with no new available labs, imaging or studies.    ROS General: NAD Cardiovascular: denies chest pain, denies DOE Pulmonary: denies cough or increased SOB Abdomen: endorses good appetite, endorses intermittent constipation, endorses continence of bowel GU: denies dysuria, endorses intermittent continence of urine MSK: pain in bilateral shoulders  no falls reported Skin: No rashes or wounds Neurological: denies insomnia Psych: Endorses positive mood  Physical Exam: Current and past weights: 105 lbs  2 oz as of 10/08/21 Constitutional: NAD  General: thin and  frail CV: S1S2, RRR with holosystolic grade III LUSB murmur, no LE edema, distal bilat feet cool and dusky Pulmonary: CTAB no increased work of breathing, no cough, room air Abdomen: normo-active BS + 4 quadrants, soft and non tender MSK: noted generalized sarcopenia, limited ROM bilateral shoulders Skin: warm and dry, no rashes or wounds on visible skin Neuro:  no generalized weakness Psych: non-anxious affect,  sleepy, arousable and O x 3    Thank you for the opportunity to participate in the care of Kayla Thompson.  The palliative care team will continue to follow. Please call our office at [phone] if we can be of additional assistance.   Kayla Conception, FNP -C  COVID-19 PATIENT SCREENING TOOL Asked and negative response unless otherwise noted:   Have you had symptoms of covid, tested positive or been in contact with someone with symptoms/positive test in the past 5-10 days?  No

## 2021-12-29 ENCOUNTER — Ambulatory Visit (INDEPENDENT_AMBULATORY_CARE_PROVIDER_SITE_OTHER): Payer: Medicare Other | Admitting: Podiatry

## 2021-12-29 ENCOUNTER — Encounter: Payer: Self-pay | Admitting: Podiatry

## 2021-12-29 DIAGNOSIS — B351 Tinea unguium: Secondary | ICD-10-CM | POA: Diagnosis not present

## 2021-12-29 DIAGNOSIS — M79674 Pain in right toe(s): Secondary | ICD-10-CM

## 2021-12-29 DIAGNOSIS — I739 Peripheral vascular disease, unspecified: Secondary | ICD-10-CM

## 2021-12-29 DIAGNOSIS — E1149 Type 2 diabetes mellitus with other diabetic neurological complication: Secondary | ICD-10-CM

## 2021-12-29 DIAGNOSIS — M79675 Pain in left toe(s): Secondary | ICD-10-CM

## 2021-12-29 NOTE — Progress Notes (Signed)
This patient returns to my office for at risk foot care.  This patient requires this care by a professional since this patient will be at risk due to having type 2 DM, and PAD.  This patient is unable to cut nails herself since the patient cannot reach her nails.These nails are painful walking and wearing shoes.  This patient presents for at risk foot care today.  General Appearance  Alert, conversant and in no acute stress.  Vascular  Dorsalis pedis and posterior tibial  pulses are  weakly palpable  bilaterally.  Capillary return is within normal limits  bilaterally. Cold feet bilaterally. Purplish feet noted.  Neurologic  Senn-Weinstein monofilament wire test within normal limits  bilaterally. Muscle power within normal limits bilaterally.  Nails Thick disfigured discolored nails with subungual debris  from hallux to fifth toes bilaterally. No evidence of bacterial infection or drainage bilaterally.  Subungual hematoma right hallux.  Orthopedic  No limitations of motion  feet .  No crepitus or effusions noted.  Mild  HAV with hammer toes 2-5  B/L.  Rigid ht second right.  Skin  normotropic skin with no porokeratosis noted bilaterally.  No signs of infections or ulcers noted.     Onychomycosis  Pain in right toes  Pain in left toes  Consent was obtained for treatment procedures.   Mechanical debridement of nails 1-5  bilaterally performed with a nail nipper.  Filed with dremel without incident.   Return office visit    4 months                  Told patient to return for periodic foot care and evaluation due to potential at risk complications.   Gardiner Barefoot DPM

## 2022-01-15 ENCOUNTER — Ambulatory Visit: Payer: Medicare Other | Admitting: Internal Medicine

## 2022-01-15 NOTE — Progress Notes (Deleted)
Patient ID: Kayla Thompson, female   DOB: November 29, 1935, 86 y.o.   MRN: [id number]  HPI  Kayla Thompson is a 86 y.o.-year-old female, initially referred by her OB/GYN doctor, Dr. Uvaldo Rising, for evaluation for hyperparathyroidism + normo/hypo-calcemia, vitamin D deficiency,  and DM2, dx 2010, insulin-dependent since 2010, controlled, w/o long-term complications.  Last visit 9 months ago.   At today's visit, she is here with her daughter.  She offers all the info about the medications, diet, activity.  Interim history: No increased urination, blurry vision, nausea, chest pain. No falls or fractures since last visit. She is  under palliative care.  At last visit, they wanted her her to stop her mealtime insulin.  She was off Humalog for 1 month and we continued this.  Reviewed history: Pt was dx with hyperparathyroidism in 08/2014.  Reviewed pertinent labs: Lab Results  Component Value Date   PTH 37 06/09/2015   PTH Comment 06/09/2015   PTH 73 (H) 10/28/2014   PTH Comment 10/28/2014   PTH 190 (H) 09/10/2014   PTH 243 (H) 08/23/2014   CALCIUM 8.9 07/28/2021   CALCIUM 9.0 07/27/2021   CALCIUM 8.9 07/26/2021   CALCIUM 9.8 07/25/2021   CALCIUM 10.1 04/20/2021   CALCIUM 9.1 04/13/2021   CALCIUM 9.0 04/12/2021   CALCIUM 8.2 (L) 04/11/2021   CALCIUM 9.3 03/31/2021   CALCIUM 9.0 03/29/2021   Her osteoporosis was diagnosed in 2007.  She had few falls.  Reviewed previous DXA scan report: Date L1-L2 (L3 and L4 excluded) T score FN T score  01/05/2018 [facility])  +0.4  LFN: -2.8  07/25/2014   -0.1  LFN: -2.7  Right hip could not be analyzed due to previous instrumentation.  She has a history of multiple fractures in: - 09/23/1990: pelvic fx - 02/2005: R hip fx - rod - 05/2006: rib fx - 12/2008: 3 R ankle fx - in [place] - plate and screws - 71/0626: wrist fx's 01/2012: steroid inj's R knee - 07/2014: forearm fx and nasal bone fx - 09/2019: Chest x-ray: Age-indeterminate  fractures of the third and fourth ribs  She started Prolia in 2017.   Per PCP. She is tolerating this well.   No h/o kidney stones.  No CKD. Last BUN/Cr: Lab Results  Component Value Date   BUN 15 07/28/2021   CREATININE 0.64 07/28/2021   She is not on HCTZ.  No FH of hypercalcemia, pituitary tumors, thyroid cancer, or osteoporosis.   Vitamin D level:   Lab Results  Component Value Date   VD25OH >120 04/20/2021   VD25OH 89.5 06/12/2020   VD25OH 101.47 (HH) 02/19/2020   VD25OH 99.3 12/14/2019   VD25OH 72.23 10/12/2018   VD25OH 81.19 06/09/2015   VD25OH 65.60 01/28/2015   VD25OH 48 08/23/2014  - we decreased the dose of vitamin D from 4000 to 2000 units in 11/2019. She is now on 1000 units.  A 24-hour urine calcium was normal: Component     Latest Ref Rng 03/31/2015  Calcium, Ur     Not estab mg/dL 9  Calcium, 24 hour urine     35 - 250 mg/24 h 108  Creatinine, Urine     20 - 320 mg/dL 78  Creatinine, 24H Ur     0.63 - 2.50 g/24 h 0.94   DM2: - dx 2010 >> she started insulin and diagnosis.  Reviewed HbA1c levels: Lab Results  Component Value Date   HGBA1C 6.5 (H) 03/27/2021   HGBA1C 6.4 (A)  12/18/2020   HGBA1C 5.6 07/07/2020   She is on: - Lantus 30 >> 25 >> 20 >> 15 >> 11 >> 8 >> 5  units midday Previously on: Humalog 4-6 >> Novolog 4 units before breakfast only >> stopped 09/2018 >> restarted Humalog 2 to 4 units before dinner >> off  She checks sugars 2x a day: - am: 74, 112-187, 218 >> 98, 114-150, 168 - after lunch: 120 >> 116-154 >> n/c  - after dinner: 132 >> 70 >> n/c - bedtime: 84-190s >> 89-256 >> 174, 193-337 >> 130-197 Lowest: 91 >> 84 >> 74 >> 98 Highest: 194 >> 190s >> 337 >> 197.  -+ HL; latest lipids:  Lab Results  Component Value Date   CHOL 132 10/16/2019   HDL 55 10/16/2019   LDLCALC 57 10/16/2019   TRIG 114 10/16/2019   CHOLHDL 2.4 10/16/2019  On Crestor 10.  + Mild CKD;. Latest BUN/Cr: Lab Results  Component Value Date   BUN  15 07/28/2021   Lab Results  Component Value Date   CREATININE 0.64 07/28/2021   Last dilated eye exam: 11/13/2020: No DR; Dr Gershon Crane.  She has a history of chalazion, has a history of cataract surgery.  Last foot exam 12/2021 - Dr. Prudence Davidson.  She also has a history of Ovarian cancer - 1996, urinary incontinence, HTN.  Hypothyroidism: Lab Results  Component Value Date   TSH 0.580 04/12/2021   TSH 0.118 (L) 03/27/2021   TSH 1.342 01/08/2021   TSH 0.681 05/07/2020   TSH 0.77 02/19/2020   TSH 0.76 10/16/2019   TSH 0.110 (L) 01/27/2019   TSH 0.85 10/12/2018   TSH 0.23 (L) 12/01/2016   TSH 0.61 07/15/2016   TSH 0.61 10/21/2015   TSH 0.36 03/03/2015   TSH 0.591 08/02/2014   She is very reticent to decrease the dose of levothyroxine due to fear of hair loss.  At last visit she was on levothyroxine 50 mcg daily.  In 01/2019, her TSH was suppressed and I again advised her to decrease the dose to 25 g daily.  She did not do so.  Pt takes the levothyroxine: - in am - fasting - at least 30 min from b'fast  - + Ca, Fe, MVI, PPIs  - moved >4h after levothyroxine  - not on Biotin  Pt denies: - feeling nodules in neck - hoarseness - choking - SOB with lying down But does have chronic dysphagia  ROS: + see HPI  I reviewed pt's medications, allergies, PMH, social hx, family hx, and changes were documented in the history of present illness. Otherwise, unchanged from my initial visit note.  Past Medical History:  Diagnosis Date   Anxiety    Arthritis    Asthma    Cataracts, bilateral    removed by surgery   COPD (chronic obstructive pulmonary disease) ([place])    Depression    Diabetes mellitus without complication ([place])    type 2   Dyspnea    with exertion   Fibromyalgia    GERD (gastroesophageal reflux disease)    History of chemotherapy    History of fractured pelvis    Hypertension    Hypothyroidism    Osteoporosis 12/2017   T score -2.8 stable from prior DEXA    Osteoporosis    Ovarian cancer ([place])    Pneumonia    several times   Past Surgical History:  Procedure Laterality Date   ABDOMINAL HYSTERECTOMY  1996   ANKLE FRACTURE SURGERY  plate and screws   BIOPSY  02/05/2019   Procedure: BIOPSY;  Surgeon: Rush Landmark Telford Nab., MD;  Location: [facility];  Service: Gastroenterology;;   BLADDER SUSPENSION     CATARACT EXTRACTION     COLONOSCOPY     ESOPHAGEAL BRUSHING  02/05/2019   Procedure: ESOPHAGEAL BRUSHING;  Surgeon: Irving Copas., MD;  Location: [facility];  Service: Gastroenterology;;   ESOPHAGOGASTRODUODENOSCOPY (EGD) WITH PROPOFOL N/A 02/05/2019   Procedure: ESOPHAGOGASTRODUODENOSCOPY (EGD) WITH PROPOFOL with dialtion;  Surgeon: Irving Copas., MD;  Location: [facility];  Service: Gastroenterology;  Laterality: N/A;   EYE SURGERY     cataracts removed-bilateral   hip surgey     knee surgey     LUNG SURGERY     OOPHORECTOMY     BSO   SAVORY DILATION N/A 02/05/2019   Procedure: SAVORY DILATION;  Surgeon: Irving Copas., MD;  Location: [facility];  Service: Gastroenterology;  Laterality: N/A;   TONSILLECTOMY     UPPER GI ENDOSCOPY     History   Social History   Marital Status: Widowed    Spouse Name: N/A   Number of Children: 2 (1 adopted)   Occupational History   retired   Social History Main Topics   Smoking status: Never Smoker    Smokeless tobacco: Never Used   Alcohol Use: No   Drug Use: No   Current Outpatient Medications on File Prior to Visit  Medication Sig Dispense Refill   Ascorbic Acid (VITAMIN C) 1000 MG tablet Take 1,000 mg by mouth in the morning and at bedtime.     azithromycin (ZITHROMAX) 250 MG tablet Sig as indicated 6 tablet 0   BD PEN NEEDLE NANO 2ND GEN 32G X 4 MM MISC USE AS DIRECTED 4 TIMES A DAY 100 each 5   cholecalciferol (VITAMIN D3) 25 MCG (1000 UNIT) tablet Take 1,000 Units by mouth daily.     Cyanocobalamin (VITAMIN B 12 PO) Take 2,500 mcg by mouth  every Thursday.     Dextromethorphan-guaiFENesin (MUCINEX DM MAXIMUM STRENGTH) 60-1200 MG TB12 Take 1 tablet by mouth in the morning and at bedtime.     docusate sodium (COLACE) 100 MG capsule 1 capsule as needed     DULoxetine (CYMBALTA) 30 MG capsule TAKE 1 CAPSULE BY MOUTH EVERY DAY (Patient taking differently: Take 30 mg by mouth daily. Patient takes with 60 mg for a total dose of 90 mg) 90 capsule 3   estradiol (ESTRACE) 0.1 MG/GM vaginal cream Place 1 Applicatorful vaginally See admin instructions. Takes 2 times a week     feeding supplement (ENSURE ENLIVE / ENSURE PLUS) LIQD Take 237 mLs by mouth 2 (two) times daily between meals. (Patient taking differently: Take 237 mLs by mouth 3 (three) times daily with meals.) 237 mL 12   FREESTYLE LITE test strip USE TO TEST BLOOD SUGAR 3 TIMES DAILY 100 strip 5   gabapentin (NEURONTIN) 100 MG capsule 1 capsule     insulin glargine (LANTUS) 100 UNIT/ML Solostar Pen Inject 5 Units into the skin in the morning.     ipratropium-albuterol (DUONEB) 0.5-2.5 (3) MG/3ML SOLN Inhale 3 mLs into the lungs every 4 (four) hours as needed. 540 mL 11   Lancets (FREESTYLE) lancets Use to test blood sugar 3 times daily. Dx: E11.65 100 each 3   levothyroxine (SYNTHROID) 50 MCG tablet TAKE 1 TABLET BY MOUTH EVERY DAY BEFORE BREAKFAST 90 tablet 1   linaclotide (LINZESS) 72 MCG capsule Take 1 capsule (72 mcg total)  by mouth daily before breakfast. 90 capsule 3   methenamine (MANDELAMINE) 1 g tablet Take 1,000 mg by mouth 2 (two) times daily.     mirtazapine (REMERON) 15 MG tablet 1 tablet at bedtime.     naloxone (NARCAN) nasal spray 4 mg/0.1 mL Place 1 spray into the nose once as needed (overdose).     nystatin cream (MYCOSTATIN) SMARTSIG:1 sparingly Topical Twice Daily     omeprazole (PRILOSEC) 20 MG capsule Take 1 capsule (20 mg total) by mouth 2 (two) times daily before a meal. **PLEASE CALL OFFICE TO SCHEDULE FOLLOW UP 180 capsule 0   oxyCODONE-acetaminophen  (PERCOCET) 7.5-325 MG tablet Take 1 tablet by mouth See admin instructions. Daughter states can take up to 5 times daily as needed     polyethylene glycol (MIRALAX / GLYCOLAX) 17 g packet Take 17 g by mouth 2 (two) times daily. Decrease to daily or every other day pending on bowel movement frequency/consistency 30 each 0   predniSONE (DELTASONE) 1 MG tablet Take 1 tablet (1 mg total) by mouth in the morning and at bedtime. (Patient taking differently: Take 1 mg by mouth every other day. This week  its Tuesday,Thursday & Saturday)     rosuvastatin (CRESTOR) 10 MG tablet TAKE 1 TABLET BY MOUTH EVERY DAY IN THE EVENING 90 tablet 3   TRELEGY ELLIPTA 100-62.5-25 MCG/ACT AEPB INHALE 1 PUFF INTO THE LUNGS DAILY. RINSE MOUTH 60 each 11   triamcinolone cream (KENALOG) 0.1 % PLEASE SEE ATTACHED FOR DETAILED DIRECTIONS     TURMERIC PO Take 1,000 mg by mouth in the morning.     No current facility-administered medications on file prior to visit.   Allergies  Allergen Reactions   Tape Other (See Comments)    SKIN IS VERY THIN AND DELICATE- will tear like paper!!   Lasix [Furosemide] Other (See Comments)    Confusion- MD stopped this med   Latex Rash   Penicillins Rash    Has patient had a PCN reaction causing immediate rash, facial/tongue/throat swelling, SOB or lightheadedness with hypotension: No Has patient had a PCN reaction causing severe rash involving mucus membranes or skin necrosis: No Has patient had a PCN reaction that required hospitalization: No Has patient had a PCN reaction occurring within the last 10 years: No If all of the above answers are "NO", then may proceed with Cephalosporin use.    Family History  Problem Relation Age of Onset   Lung cancer Mother    CAD Father    Diabetes Father    Lung cancer Maternal Aunt    Diabetes Paternal Aunt    Diabetes Paternal Uncle    Diabetes Paternal Grandmother    Diabetes Paternal Grandfather    Colon cancer Neg Hx    Esophageal cancer  Neg Hx    Inflammatory bowel disease Neg Hx    Liver disease Neg Hx    Pancreatic cancer Neg Hx    Rectal cancer Neg Hx    Stomach cancer Neg Hx    PE: LMP  (LMP Unknown)   Wt Readings from Last 3 Encounters:  10/08/21 105 lb 2 oz (47.7 kg)  07/25/21 93 lb (42.2 kg)  05/13/21 87 lb (39.5 kg)   Constitutional: normal weight, in NAD Eyes: EOMI, no exophthalmos ENT:  no thyromegaly, no cervical lymphadenopathy Cardiovascular: tachycardia, RR, No MRG, + bilateral lower extremity edema Respiratory: CTA B Musculoskeletal: + finger deformities ([place] and Bouchard nodules) Skin:  no rashes Neurological: + tremor with outstretched  hands  Assessment: 1. H/o Hyperparathyroidism - recent normocalcemia  2. Vitamin D def  3. DM2, insulin-dependent, controlled, without complications  4. Low TSH  5.  Osteoporosis  Plan: 1. HPTH and 2. Vit D def -Patient with history of hyperparathyroidism in the setting of low calcium, with PTH levels normalizing after correction of the calcium levels.  She also continues on Prolia injections, vitamin D. -The latest vitamin D level was actually quite high, at 120.  Previously 101. -Before last visit, the level was 89.5.  At that time, she was taking 1000 units vitamin D daily, decreased gradually from 4000 units daily. -At today's visit, we discussed about the dangers of having a high vitamin D - last calcium level was normal in 07/2021  3. DM2 - Patient with longstanding, previously uncontrolled, type 2 diabetes, on basal-bolus insulin regimen, with improving blood sugars at last visit.  HbA1c at that time was 6.5%.  At that time I advised her to stay off Humalog (she was off for approximately 1 month) and only to use it if needed (if sugars increase to upper 200s or 300s). - - I advised her to  Patient Instructions  Please continue: - Lantus 5 units mid-day  You can stay off: - Humalog  Continue levothyroxine 50 mcg daily.  Take the  thyroid hormone every day, with water, at least 30 minutes before breakfast, separated by at least 4 hours from: - acid reflux medications - calcium - iron - multivitamins  Continue vitamin D 1000 units daily.  Please come back for a follow-up appointment in 6 months.  - we checked her HbA1c: 7%  - advised to check sugars at different times of the day - 1x a day, rotating check times - advised for yearly eye exams >> she is UTD - return to clinic in 6 months  4.  Hypothyroidism - latest thyroid labs reviewed with pt. >> normal: Lab Results  Component Value Date   TSH 0.580 04/12/2021  - she continues on LT4 50 mcg daily - pt feels good on this dose. - we discussed about taking the thyroid hormone every day, with water, >30 minutes before breakfast, separated by >4 hours from acid reflux medications, calcium, iron, multivitamins. Pt. is taking it correctly. - will check thyroid tests today: TSH and fT4 - If labs are abnormal, she will need to return for repeat TFTs in 1.5 months  5.  Osteoporosis - per PCP - on Prolia in PCP's office -tolerated well - Reviewed her bone density results from 12/2017: Left femoral neck T score was -2.8, only slightly lower compared to 2016, when he was -2.6.  The right femoral neck could not be analyzed due to hardware. -In 2021 she had a chest x-ray which showed age indeterminate rib fractures of the third and fourth ribs.  Also, a left hip x-ray showed chronic fracture deformities in the bilateral superior and inferior pubic rami (these were previously known). -She palliative care.  I do not think that she needs further DXA scans.  Philemon Kingdom, MD PhD [facility]

## 2022-02-08 NOTE — Telephone Encounter (Signed)
Forwarding to Rx prior auth team

## 2022-02-08 NOTE — Telephone Encounter (Signed)
Prolia VOB initiated via [email or web address] 

## 2022-02-10 NOTE — Telephone Encounter (Signed)
Pt ready for scheduling on or after 03/12/22   Out-of-pocket cost due at time of visit: $0   Primary: Medicare Prolia co-insurance: 20% (approximately $276) Admin fee co-insurance: 20% (approximately $25)   Secondary: AARP Medicare Supp Prolia co-insurance: Covers Medicare Part B co-insurance and deductible.  Admin fee co-insurance: Covers Medicare Part B co-insurance and deductible.    Deductible: covered by secondary   Prior Auth: not required PA# Valid:    ** This summary of benefits is an estimation of the patient's out-of-pocket cost. Exact cost may vary based on individual plan coverage.

## 2022-02-13 ENCOUNTER — Other Ambulatory Visit: Payer: Self-pay | Admitting: Internal Medicine

## 2022-02-13 ENCOUNTER — Other Ambulatory Visit: Payer: Self-pay | Admitting: Gastroenterology

## 2022-02-15 ENCOUNTER — Telehealth: Payer: Self-pay | Admitting: Family Medicine

## 2022-02-15 NOTE — Telephone Encounter (Signed)
Pt needs a refill on her chronic OxyCodone/Tylenol 10 mg/325 mg which was last filled 01/16/22.  PDMP reviewed and no red flags noted.  Refill sent for 1 tab QID prn, disp #120, NR.  She also has URI which is improving but congestion per message from daughter, [name]. TCT [name], she states that her mother picked up a URI from her, did not run fever and is currently improving except for deep, barking cough.  She has been giving her Mucinex Max at HS, blood sugars are in good range for control for her of 120s-140s with an isolated 200.  [name] is travelling back to [place] on Friday for her job until December 17th. She is concerned about the possibility of a secondary infection and will have paid caregivers that stay with her Mom and her sister will be stopping by but does not handle the medical aspects of patient's care.  Marylou Mccoy that there have been some changes in the home Palliative Program and explained that since her mother does not have active cancer, is not on Hospice or has not been on Hospice and been d/c'd and because she is still able to go outside the home some that she will not meet the current criteria for the new [facility] home palliative program.  Advised that I will go ahead and fill her month's supply of pain meds to give her adequate time to make other arrangements for her pain management.  Also advised that I would give her Azithromycin 500 mg day 1, 250 mg days 2-5, disp #6, NR and since pt is diabetic with repetitive yeast, will give her Fluconazole 1 pill to take at completion of antibiotic therapy if needed.  Also sent in prescription for Benzonatate perles 100 mg TID prn cough and advised she could continues this with the Mucinex Max.  Advised if pt develops fever, increased sputum production that suddenly becomes discolored, SOB and not eating she should contact PCP or follow up with urgent care.  [name] acknowledged information and was informed that she would be receiving a letter in follow up  but I wanted to give her adequate time to make other arrangements.  Also advised if her mother declines in her physical health, is losing weight and function that she may be a candidate at a later time for hospice and [facility] would be happy to see her.  Kayla Hippo FNP-C

## 2022-02-17 ENCOUNTER — Encounter (HOSPITAL_COMMUNITY): Payer: Self-pay

## 2022-02-17 ENCOUNTER — Inpatient Hospital Stay (HOSPITAL_BASED_OUTPATIENT_CLINIC_OR_DEPARTMENT_OTHER)
Admission: EM | Admit: 2022-02-17 | Discharge: 2022-02-24 | DRG: 193 | Disposition: A | Discharge status: Home Health | Payer: Medicare Other | Attending: Internal Medicine | Admitting: Internal Medicine

## 2022-02-17 ENCOUNTER — Other Ambulatory Visit: Payer: Self-pay

## 2022-02-17 ENCOUNTER — Emergency Department (HOSPITAL_BASED_OUTPATIENT_CLINIC_OR_DEPARTMENT_OTHER): Payer: Medicare Other | Admitting: Radiology

## 2022-02-17 ENCOUNTER — Encounter (HOSPITAL_BASED_OUTPATIENT_CLINIC_OR_DEPARTMENT_OTHER): Payer: Self-pay

## 2022-02-17 DIAGNOSIS — Z1152 Encounter for screening for COVID-19: Secondary | ICD-10-CM

## 2022-02-17 DIAGNOSIS — Z794 Long term (current) use of insulin: Secondary | ICD-10-CM | POA: Diagnosis not present

## 2022-02-17 DIAGNOSIS — F32A Depression, unspecified: Secondary | ICD-10-CM | POA: Diagnosis present

## 2022-02-17 DIAGNOSIS — F03B0 Unspecified dementia, moderate, without behavioral disturbance, psychotic disturbance, mood disturbance, and anxiety: Secondary | ICD-10-CM | POA: Diagnosis present

## 2022-02-17 DIAGNOSIS — F419 Anxiety disorder, unspecified: Secondary | ICD-10-CM | POA: Diagnosis present

## 2022-02-17 DIAGNOSIS — Z833 Family history of diabetes mellitus: Secondary | ICD-10-CM

## 2022-02-17 DIAGNOSIS — N179 Acute kidney failure, unspecified: Secondary | ICD-10-CM

## 2022-02-17 DIAGNOSIS — R1310 Dysphagia, unspecified: Secondary | ICD-10-CM | POA: Diagnosis present

## 2022-02-17 DIAGNOSIS — E119 Type 2 diabetes mellitus without complications: Secondary | ICD-10-CM | POA: Diagnosis not present

## 2022-02-17 DIAGNOSIS — J4530 Mild persistent asthma, uncomplicated: Secondary | ICD-10-CM | POA: Diagnosis not present

## 2022-02-17 DIAGNOSIS — J449 Chronic obstructive pulmonary disease, unspecified: Secondary | ICD-10-CM

## 2022-02-17 DIAGNOSIS — E43 Unspecified severe protein-calorie malnutrition: Secondary | ICD-10-CM | POA: Diagnosis present

## 2022-02-17 DIAGNOSIS — Z7401 Bed confinement status: Secondary | ICD-10-CM | POA: Diagnosis not present

## 2022-02-17 DIAGNOSIS — Z7951 Long term (current) use of inhaled steroids: Secondary | ICD-10-CM

## 2022-02-17 DIAGNOSIS — Z681 Body mass index (BMI) 19 or less, adult: Secondary | ICD-10-CM | POA: Diagnosis not present

## 2022-02-17 DIAGNOSIS — J111 Influenza due to unidentified influenza virus with other respiratory manifestations: Secondary | ICD-10-CM | POA: Diagnosis not present

## 2022-02-17 DIAGNOSIS — E213 Hyperparathyroidism, unspecified: Secondary | ICD-10-CM | POA: Diagnosis present

## 2022-02-17 DIAGNOSIS — J440 Chronic obstructive pulmonary disease with acute lower respiratory infection: Secondary | ICD-10-CM | POA: Diagnosis not present

## 2022-02-17 DIAGNOSIS — Z7189 Other specified counseling: Secondary | ICD-10-CM | POA: Diagnosis not present

## 2022-02-17 DIAGNOSIS — Z801 Family history of malignant neoplasm of trachea, bronchus and lung: Secondary | ICD-10-CM

## 2022-02-17 DIAGNOSIS — M810 Age-related osteoporosis without current pathological fracture: Secondary | ICD-10-CM | POA: Diagnosis present

## 2022-02-17 DIAGNOSIS — E785 Hyperlipidemia, unspecified: Secondary | ICD-10-CM | POA: Diagnosis present

## 2022-02-17 DIAGNOSIS — J1008 Influenza due to other identified influenza virus with other specified pneumonia: Secondary | ICD-10-CM | POA: Diagnosis present

## 2022-02-17 DIAGNOSIS — Z7952 Long term (current) use of systemic steroids: Secondary | ICD-10-CM

## 2022-02-17 DIAGNOSIS — Z8249 Family history of ischemic heart disease and other diseases of the circulatory system: Secondary | ICD-10-CM

## 2022-02-17 DIAGNOSIS — E039 Hypothyroidism, unspecified: Secondary | ICD-10-CM

## 2022-02-17 DIAGNOSIS — M353 Polymyalgia rheumatica: Secondary | ICD-10-CM | POA: Diagnosis not present

## 2022-02-17 DIAGNOSIS — M797 Fibromyalgia: Secondary | ICD-10-CM | POA: Diagnosis present

## 2022-02-17 DIAGNOSIS — J9611 Chronic respiratory failure with hypoxia: Secondary | ICD-10-CM | POA: Diagnosis not present

## 2022-02-17 DIAGNOSIS — Z1619 Resistance to other specified beta lactam antibiotics: Secondary | ICD-10-CM | POA: Diagnosis not present

## 2022-02-17 DIAGNOSIS — Z515 Encounter for palliative care: Secondary | ICD-10-CM | POA: Diagnosis not present

## 2022-02-17 DIAGNOSIS — Z79899 Other long term (current) drug therapy: Secondary | ICD-10-CM

## 2022-02-17 DIAGNOSIS — J159 Unspecified bacterial pneumonia: Secondary | ICD-10-CM | POA: Diagnosis present

## 2022-02-17 DIAGNOSIS — I5032 Chronic diastolic (congestive) heart failure: Secondary | ICD-10-CM | POA: Diagnosis not present

## 2022-02-17 DIAGNOSIS — B965 Pseudomonas (aeruginosa) (mallei) (pseudomallei) as the cause of diseases classified elsewhere: Secondary | ICD-10-CM | POA: Diagnosis present

## 2022-02-17 DIAGNOSIS — I110 Hypertensive heart disease with heart failure: Secondary | ICD-10-CM | POA: Diagnosis present

## 2022-02-17 DIAGNOSIS — D72829 Elevated white blood cell count, unspecified: Secondary | ICD-10-CM | POA: Diagnosis not present

## 2022-02-17 DIAGNOSIS — M069 Rheumatoid arthritis, unspecified: Secondary | ICD-10-CM | POA: Diagnosis present

## 2022-02-17 DIAGNOSIS — E860 Dehydration: Secondary | ICD-10-CM | POA: Diagnosis not present

## 2022-02-17 DIAGNOSIS — E1165 Type 2 diabetes mellitus with hyperglycemia: Secondary | ICD-10-CM

## 2022-02-17 DIAGNOSIS — E876 Hypokalemia: Secondary | ICD-10-CM | POA: Diagnosis present

## 2022-02-17 DIAGNOSIS — K219 Gastro-esophageal reflux disease without esophagitis: Secondary | ICD-10-CM | POA: Diagnosis present

## 2022-02-17 DIAGNOSIS — R531 Weakness: Secondary | ICD-10-CM | POA: Diagnosis not present

## 2022-02-17 DIAGNOSIS — E871 Hypo-osmolality and hyponatremia: Secondary | ICD-10-CM | POA: Diagnosis present

## 2022-02-17 DIAGNOSIS — J101 Influenza due to other identified influenza virus with other respiratory manifestations: Principal | ICD-10-CM

## 2022-02-17 DIAGNOSIS — R0602 Shortness of breath: Secondary | ICD-10-CM | POA: Diagnosis not present

## 2022-02-17 DIAGNOSIS — D638 Anemia in other chronic diseases classified elsewhere: Secondary | ICD-10-CM | POA: Diagnosis present

## 2022-02-17 DIAGNOSIS — Z90710 Acquired absence of both cervix and uterus: Secondary | ICD-10-CM

## 2022-02-17 DIAGNOSIS — Z87440 Personal history of urinary (tract) infections: Secondary | ICD-10-CM

## 2022-02-17 DIAGNOSIS — J45909 Unspecified asthma, uncomplicated: Secondary | ICD-10-CM | POA: Diagnosis present

## 2022-02-17 DIAGNOSIS — Z9221 Personal history of antineoplastic chemotherapy: Secondary | ICD-10-CM

## 2022-02-17 DIAGNOSIS — B9562 Methicillin resistant Staphylococcus aureus infection as the cause of diseases classified elsewhere: Secondary | ICD-10-CM | POA: Diagnosis present

## 2022-02-17 DIAGNOSIS — R413 Other amnesia: Secondary | ICD-10-CM | POA: Diagnosis present

## 2022-02-17 DIAGNOSIS — R627 Adult failure to thrive: Secondary | ICD-10-CM | POA: Diagnosis present

## 2022-02-17 DIAGNOSIS — N390 Urinary tract infection, site not specified: Secondary | ICD-10-CM | POA: Diagnosis not present

## 2022-02-17 DIAGNOSIS — Z79890 Hormone replacement therapy (postmenopausal): Secondary | ICD-10-CM

## 2022-02-17 DIAGNOSIS — J9811 Atelectasis: Secondary | ICD-10-CM | POA: Diagnosis not present

## 2022-02-17 DIAGNOSIS — Z8543 Personal history of malignant neoplasm of ovary: Secondary | ICD-10-CM

## 2022-02-17 DIAGNOSIS — Z66 Do not resuscitate: Secondary | ICD-10-CM | POA: Diagnosis present

## 2022-02-17 LAB — RESP PANEL BY RT-PCR (FLU A&B, COVID) ARPGX2
Influenza A by PCR: POSITIVE — AB
Influenza B by PCR: NEGATIVE
SARS Coronavirus 2 by RT PCR: NEGATIVE

## 2022-02-17 LAB — CBC
HCT: 31 % — ABNORMAL LOW (ref 36.0–46.0)
Hemoglobin: 9.6 g/dL — ABNORMAL LOW (ref 12.0–15.0)
MCH: 30 pg (ref 26.0–34.0)
MCHC: 31 g/dL (ref 30.0–36.0)
MCV: 96.9 fL (ref 80.0–100.0)
Platelets: 222 10*3/uL (ref 150–400)
RBC: 3.2 MIL/uL — ABNORMAL LOW (ref 3.87–5.11)
RDW: 14 % (ref 11.5–15.5)
WBC: 9.5 10*3/uL (ref 4.0–10.5)
nRBC: 0 % (ref 0.0–0.2)

## 2022-02-17 LAB — BASIC METABOLIC PANEL
Anion gap: 11 (ref 5–15)
BUN: 25 mg/dL — ABNORMAL HIGH (ref 8–23)
CO2: 20 mmol/L — ABNORMAL LOW (ref 22–32)
Calcium: 8.4 mg/dL — ABNORMAL LOW (ref 8.9–10.3)
Chloride: 101 mmol/L (ref 98–111)
Creatinine, Ser: 1.14 mg/dL — ABNORMAL HIGH (ref 0.44–1.00)
GFR, Estimated: 47 mL/min — ABNORMAL LOW (ref >60)
Glucose, Bld: 211 mg/dL — ABNORMAL HIGH (ref 70–99)
Potassium: 3.8 mmol/L (ref 3.5–5.1)
Sodium: 132 mmol/L — ABNORMAL LOW (ref 135–145)

## 2022-02-17 LAB — URINALYSIS, ROUTINE W REFLEX MICROSCOPIC
Bilirubin Urine: NEGATIVE
Glucose, UA: NEGATIVE mg/dL
Ketones, ur: NEGATIVE mg/dL
Nitrite: NEGATIVE
Protein, ur: 30 mg/dL — AB
Specific Gravity, Urine: 1.011 (ref 1.005–1.030)
WBC, UA: >50 WBC/hpf — ABNORMAL HIGH (ref 0–5)
pH: 6.5 (ref 5.0–8.0)

## 2022-02-17 MED ORDER — LACTATED RINGERS IV BOLUS
1000.0000 mL | Freq: Once | INTRAVENOUS | Status: AC
  Administered 2022-02-17: 1000 mL via INTRAVENOUS

## 2022-02-17 MED ORDER — IPRATROPIUM-ALBUTEROL 0.5-2.5 (3) MG/3ML IN SOLN
3.0000 mL | Freq: Once | RESPIRATORY_TRACT | Status: AC
  Administered 2022-02-17: 3 mL via RESPIRATORY_TRACT
  Filled 2022-02-17: qty 3

## 2022-02-17 MED ORDER — OXYCODONE-ACETAMINOPHEN 5-325 MG PO TABS
1.0000 | ORAL_TABLET | Freq: Every day | ORAL | Status: DC | PRN
  Filled 2022-02-17: qty 1

## 2022-02-17 MED ORDER — OXYCODONE HCL 5 MG PO TABS
10.0000 mg | ORAL_TABLET | Freq: Four times a day (QID) | ORAL | Status: DC | PRN
  Administered 2022-02-17 – 2022-02-22 (×10): 10 mg via ORAL
  Filled 2022-02-17 (×11): qty 2

## 2022-02-17 MED ORDER — IBUPROFEN 400 MG PO TABS
400.0000 mg | ORAL_TABLET | Freq: Once | ORAL | Status: AC
  Administered 2022-02-17: 400 mg via ORAL
  Filled 2022-02-17: qty 1

## 2022-02-17 MED ORDER — OSELTAMIVIR PHOSPHATE 75 MG PO CAPS
75.0000 mg | ORAL_CAPSULE | Freq: Once | ORAL | Status: AC
  Administered 2022-02-17: 75 mg via ORAL
  Filled 2022-02-17: qty 1

## 2022-02-17 NOTE — H&P (Signed)
PCP:   [name], MD   Chief Complaint: Weakness  HPI: This is a  86 y.o. female with PMHx of DM type 2, anxiety and depression, hypothyroidism, CHF, HTN, HLD, GERD, RA, chronic UTIs, and COPD/asthma on intermittent oxygen.  The patient lives with her daughter who recently had a viral illness.  Subsequent to that the patient developed a cough productive of greenish sputum, decreased appetite and p.o. intake.  Her symptoms have been waxing and waning but today she became somewhat confused and weak.  At baseline she uses a rolling walker.  Monday through Saturday she has 8 hours of the home health aide to help with ADLs.  At baseline she transfers but today needed 2 individuals to get her up.  911 was called and she was taken to [facility].  The patient was diagnosed with influenza.  The hospitalist have been asked to admit  Review of Systems:  The patient denies anorexia, fever, weight loss,, vision loss, decreased hearing, hoarseness, chest pain, syncope, dyspnea on exertion, peripheral edema, balance deficits, hemoptysis, abdominal pain, melena, hematochezia, severe indigestion/heartburn, hematuria, incontinence, genital sores, muscle weakness, suspicious skin lesions, transient blindness, difficulty walking, depression, unusual weight change, abnormal bleeding, enlarged lymph nodes, angioedema, and breast masses. Positives: Confusion, anorexia, weakness, cough,  Past Medical History: Past Medical History:  Diagnosis Date   Anxiety    Arthritis    Asthma    Cataracts, bilateral    removed by surgery   COPD (chronic obstructive pulmonary disease) ([place])    Depression    Diabetes mellitus without complication (HCC)    type 2   Dyspnea    with exertion   Fibromyalgia    GERD (gastroesophageal reflux disease)    History of chemotherapy    History of fractured pelvis    Hypertension    Hypothyroidism    Osteoporosis 12/2017   T score -2.8 stable from prior DEXA    Osteoporosis    Ovarian cancer ([place])    Pneumonia    several times   Past Surgical History:  Procedure Laterality Date   ABDOMINAL HYSTERECTOMY  1996   ANKLE FRACTURE SURGERY     plate and screws   BIOPSY  02/05/2019   Procedure: BIOPSY;  Surgeon: Irving Copas., MD;  Location: [facility] ENDOSCOPY;  Service: Gastroenterology;;   BLADDER SUSPENSION     CATARACT EXTRACTION     COLONOSCOPY     ESOPHAGEAL BRUSHING  02/05/2019   Procedure: ESOPHAGEAL BRUSHING;  Surgeon: Irving Copas., MD;  Location: [facility] ENDOSCOPY;  Service: Gastroenterology;;   ESOPHAGOGASTRODUODENOSCOPY (EGD) WITH PROPOFOL N/A 02/05/2019   Procedure: ESOPHAGOGASTRODUODENOSCOPY (EGD) WITH PROPOFOL with dialtion;  Surgeon: Irving Copas., MD;  Location: [place];  Service: Gastroenterology;  Laterality: N/A;   EYE SURGERY     cataracts removed-bilateral   hip surgey     knee surgey     LUNG SURGERY     OOPHORECTOMY     BSO   SAVORY DILATION N/A 02/05/2019   Procedure: SAVORY DILATION;  Surgeon: Irving Copas., MD;  Location: [place];  Service: Gastroenterology;  Laterality: N/A;   TONSILLECTOMY     UPPER GI ENDOSCOPY      Medications: Prior to Admission medications   Medication Sig Start Date End Date Taking? Authorizing Provider  oxyCODONE-acetaminophen (PERCOCET) 10-325 MG tablet Take 1 tablet by mouth every 6 (six) hours as needed for pain. 02/15/22  Yes [provider]  Ascorbic Acid (VITAMIN C) 1000 MG tablet Take  1,000 mg by mouth in the morning and at bedtime.    [provider]  azithromycin (ZITHROMAX) 250 MG tablet Sig as indicated 10/08/21   Horald Pollen, MD  BD PEN NEEDLE NANO 2ND GEN 32G X 4 MM MISC USE AS DIRECTED 4 TIMES A DAY 10/22/20   [name], MD  cholecalciferol (VITAMIN D3) 25 MCG (1000 UNIT) tablet Take 1,000 Units by mouth daily.    [provider]  Cyanocobalamin (VITAMIN B 12 PO) Take 2,500 mcg by mouth every  Thursday.    [provider]  Dextromethorphan-guaiFENesin (MUCINEX DM MAXIMUM STRENGTH) 60-1200 MG TB12 Take 1 tablet by mouth in the morning and at bedtime.    [provider]  docusate sodium (COLACE) 100 MG capsule 1 capsule as needed    [provider]  DULoxetine (CYMBALTA) 30 MG capsule TAKE 1 CAPSULE BY MOUTH EVERY DAY Patient taking differently: Take 30 mg by mouth daily. Patient takes with 60 mg for a total dose of 90 mg 05/05/21   [name], MD  estradiol (ESTRACE) 0.1 MG/GM vaginal cream Place 1 Applicatorful vaginally See admin instructions. Takes 2 times a week 06/28/20   [provider]  feeding supplement (ENSURE ENLIVE / ENSURE PLUS) LIQD Take 237 mLs by mouth 2 (two) times daily between meals. Patient taking differently: Take 237 mLs by mouth 3 (three) times daily with meals. 01/16/20   Hongalgi, Lenis Dickinson, MD  FREESTYLE LITE test strip USE TO TEST BLOOD SUGAR 3 TIMES DAILY 06/04/21   Philemon Kingdom, MD  gabapentin (NEURONTIN) 100 MG capsule 1 capsule    [provider]  insulin glargine (LANTUS) 100 UNIT/ML Solostar Pen Inject 5 Units into the skin in the morning.    [provider]  ipratropium-albuterol (DUONEB) 0.5-2.5 (3) MG/3ML SOLN Inhale 3 mLs into the lungs every 4 (four) hours as needed. 06/26/21   Baird Lyons D, MD  Lancets (FREESTYLE) lancets Use to test blood sugar 3 times daily. Dx: E11.65 10/14/16   Philemon Kingdom, MD  levothyroxine (SYNTHROID) 50 MCG tablet TAKE 1 TABLET BY MOUTH EVERY DAY BEFORE BREAKFAST 09/08/21   [name], MD  linaclotide [facility]) 72 MCG capsule Take 1 capsule (72 mcg total) by mouth daily before breakfast. 03/11/21   Biagio Borg, MD  methenamine (MANDELAMINE) 1 g tablet Take 1,000 mg by mouth 2 (two) times daily.    [provider]  mirtazapine (REMERON) 15 MG tablet 1 tablet at bedtime.    [provider]  naloxone [facility]) nasal spray 4 mg/0.1  mL Place 1 spray into the nose once as needed (overdose). 06/27/20   [provider]  nystatin cream (MYCOSTATIN) SMARTSIG:1 sparingly Topical Twice Daily 06/26/21   [provider]  omeprazole (PRILOSEC) 20 MG capsule TAKE 1 CAPSULE BY MOUTH TWICE A DAY BEFORE A MEAL 02/15/22   Mansouraty, Telford Nab., MD  oxyCODONE-acetaminophen (PERCOCET) 7.5-325 MG tablet Take 1 tablet by mouth See admin instructions. Daughter states can take up to 5 times daily as needed    [provider]  polyethylene glycol (MIRALAX / GLYCOLAX) 17 g packet Take 17 g by mouth 2 (two) times daily. Decrease to daily or every other day pending on bowel movement frequency/consistency 04/14/21   Mariel Aloe, MD  predniSONE (DELTASONE) 1 MG tablet Take 1 tablet (1 mg total) by mouth in the morning and at bedtime. Patient taking differently: Take 1 mg by mouth every other day. This week  its Tuesday,Thursday & Saturday 03/31/21   Kathie Dike, MD  rosuvastatin (CRESTOR) 10 MG tablet TAKE 1 TABLET BY MOUTH EVERY DAY IN THE EVENING 05/05/21   [name], MD  TRELEGY ELLIPTA 100-62.5-25 MCG/ACT AEPB INHALE 1 PUFF INTO THE LUNGS DAILY. RINSE MOUTH 08/18/21   Young, Tarri Fuller D, MD  triamcinolone cream (KENALOG) 0.1 % PLEASE SEE ATTACHED FOR DETAILED DIRECTIONS 06/26/21   [provider]  TURMERIC PO Take 1,000 mg by mouth in the morning.    [provider]    Allergies:   Allergies  Allergen Reactions   Tape Other (See Comments)    SKIN IS VERY THIN AND DELICATE- will tear like paper!!   Lasix [Furosemide] Other (See Comments)    Confusion- MD stopped this med   Latex Rash   Penicillins Rash    Has patient had a PCN reaction causing immediate rash, facial/tongue/throat swelling, SOB or lightheadedness with hypotension: No Has patient had a PCN reaction causing severe rash involving mucus membranes or skin necrosis: No Has patient had a PCN reaction that required hospitalization:  No Has patient had a PCN reaction occurring within the last 10 years: No If all of the above answers are "NO", then may proceed with Cephalosporin use.     Social History:  reports that she has never smoked. She has never been exposed to tobacco smoke. She has never used smokeless tobacco. She reports that she does not drink alcohol and does not use drugs.  Family History: Family History  Problem Relation Age of Onset   Lung cancer Mother    CAD Father    Diabetes Father    Lung cancer Maternal Aunt    Diabetes Paternal Aunt    Diabetes Paternal Uncle    Diabetes Paternal Grandmother    Diabetes Paternal Grandfather    Colon cancer Neg Hx    Esophageal cancer Neg Hx    Inflammatory bowel disease Neg Hx    Liver disease Neg Hx    Pancreatic cancer Neg Hx    Rectal cancer Neg Hx    Stomach cancer Neg Hx     Physical Exam: Vitals:   02/17/22 1930 02/17/22 2000 02/17/22 2126 02/17/22 2159  BP: 134/72 (!) 147/75  132/66  Pulse: 91 89  (!) 103  Resp: (!) 27 (!) 29  20  Temp:   (!) 102.5 F (39.2 C) 99.6 F (37.6 C)  TempSrc:   Oral Oral  SpO2: 97% 100%  95%  Weight:      Height:        General:  Alert and oriented times three, well developed and nourished, no acute distress Eyes: PERRLA, pink conjunctiva, no scleral icterus ENT: Moist oral mucosa, neck supple, no thyromegaly Lungs: clear to ascultation, no wheeze, no crackles, no use of accessory muscles Cardiovascular: regular rate and rhythm, no regurgitation, no gallops, no murmurs. No carotid bruits, no JVD Abdomen: soft, positive BS, non-tender, non-distended, no organomegaly, not an acute abdomen GU: not examined Neuro: CN II - XII grossly intact, sensation intact Musculoskeletal: strength 5/5 all extremities, no clubbing, cyanosis or edema Skin: no rash, no subcutaneous crepitation, no decubitus Psych: appropriate patient   Labs on Admission:  Recent Labs    02/17/22 1423  NA 132*  K 3.8  CL 101  CO2  20*  GLUCOSE 211*  BUN 25*  CREATININE 1.14*  CALCIUM 8.4*    Recent Labs    02/17/22 1423  WBC 9.5  HGB 9.6*  HCT 31.0*  MCV 96.9  PLT 222    Micro Results: Recent Results (from the past 240 hour(s))  Resp Panel by RT-PCR (Flu A&B, Covid) Anterior Nasal Swab     Status: Abnormal   Collection Time: 02/17/22  2:23 PM   Specimen: Anterior Nasal Swab  Result Value Ref Range Status   SARS Coronavirus 2 by RT PCR NEGATIVE NEGATIVE Final    Comment: (NOTE) SARS-CoV-2 target nucleic acids are NOT DETECTED.  The SARS-CoV-2 RNA is generally detectable in upper respiratory specimens during the acute phase of infection. The lowest concentration of SARS-CoV-2 viral copies this assay can detect is 138 copies/mL. A negative result does not preclude SARS-Cov-2 infection and should not be used as the sole basis for treatment or other patient management decisions. A negative result may occur with  improper specimen collection/handling, submission of specimen other than nasopharyngeal swab, presence of viral mutation(s) within the areas targeted by this assay, and inadequate number of viral copies(<138 copies/mL). A negative result must be combined with clinical observations, patient history, and epidemiological information. The expected result is Negative.  Fact Sheet for Patients:  EntrepreneurPulse.com.au  Fact Sheet for Healthcare Providers:  IncredibleEmployment.be  This test is no t yet approved or cleared by the [place] FDA and  has been authorized for detection and/or diagnosis of SARS-CoV-2 by FDA under an Emergency Use Authorization (EUA). This EUA will remain  in effect (meaning this test can be used) for the duration of the COVID-19 declaration under Section 564(b)(1) of the Act, 21 U.S.C.section 360bbb-3(b)(1), unless the authorization is terminated  or revoked sooner.       Influenza A by PCR POSITIVE (A) NEGATIVE Final    Influenza B by PCR NEGATIVE NEGATIVE Final    Comment: (NOTE) The Xpert Xpress SARS-CoV-2/FLU/RSV plus assay is intended as an aid in the diagnosis of influenza from Nasopharyngeal swab specimens and should not be used as a sole basis for treatment. Nasal washings and aspirates are unacceptable for Xpert Xpress SARS-CoV-2/FLU/RSV testing.  Fact Sheet for Patients: EntrepreneurPulse.com.au  Fact Sheet for Healthcare Providers: IncredibleEmployment.be  This test is not yet approved or cleared by the [place] FDA and has been authorized for detection and/or diagnosis of SARS-CoV-2 by FDA under an Emergency Use Authorization (EUA). This EUA will remain in effect (meaning this test can be used) for the duration of the COVID-19 declaration under Section 564(b)(1) of the Act, 21 U.S.C. section 360bbb-3(b)(1), unless the authorization is terminated or revoked.  Performed at [facility], [address], [place], [place] [address]      Radiological Exams on Admission: DG Chest 2 View  Result Date: 02/17/2022 CLINICAL DATA:  SOB EXAM: CHEST - 2 VIEW COMPARISON:  07/25/2021. FINDINGS: The heart size and mediastinal contours are within normal limits. Linear scarring or subsegmental atelectasis identified right base. No evidence of pulmonary edema or pneumonia. No pneumothorax or pleural effusion. There are thoracic degenerative changes. Old bilateral rib fractures are identified, a stable finding. Aorta appears ectatic and calcified. IMPRESSION: Right base subsegmental atelectasis. No acute cardiopulmonary process identified Electronically Signed   By: Sammie Bench M.D.   On: 02/17/2022 14:56    Assessment/Plan Present on Admission:  Influenza infection -Admit to MedSurg -Tamiflu twice daily -Nebulizers as needed  Diabetes mellitus -Lantus ordered, sliding scale insulin   HLD (hyperlipidemia) -Stable, statin ordered    Hypothyroidism -Stable, Synthroid ordered   Dyslipidemia -Stable, Crestor resumed   Chronic heart failure with preserved ejection fraction (  [place]) -   Memory changes -Resume patient's memantine   COPD (chronic obstructive pulmonary disease) (HCC)  Chronic respiratory failure with hypoxia (HCC) -Stable, Trelegy resumed, nebs.   Severe protein-calorie malnutrition (HCC)  PMR (polymyalgia rheumatica) ([place])  Carol Loftin 02/17/2022, 11:31 PM

## 2022-02-17 NOTE — ED Notes (Signed)
SBAR report given to Ellard Artis, RN at [facility] at this time.

## 2022-02-17 NOTE — ED Provider Notes (Signed)
[facility] EMERGENCY DEPT Provider Note   CSN: [id number] Arrival date & time: 02/17/22  1400     History  Chief Complaint  Patient presents with   Cough    Kayla Thompson is a 86 y.o. female.  With PMH of DM2, HTN, HLD, GERD, COPD/asthma who presents with cough and fever and failure to thrive.  Patient's daughter has been living with her mother for the past year to help support her and take care of her as well as a home health aide.  The daughter was recently ill with a viral illness and recently recovered but her mother started getting ill this past Thursday.  Initially she improved but then this past Sunday Monday she has been getting worse again.  She has had productive cough of greenish-gray sputum.  She has also had decreased p.o. intake and no appetite.  She has had no GI symptoms no vomiting, no diarrhea, no abdominal pain or chest pain.  She is denying any complaints but patient's daughter notes that she does not like to complain often.  She does get recurrent UTIs.  They called her PCP who sent in a Z-Pak which she is on day 2 of.  However since she has had no improvement they decided to bring her into the ER.  They also note some mild confusion and generalized fatigue and weakness when typically she is able to transfer and stand up on her own she has been required to have 2 people assist or even get into bed.   Cough      Home Medications Prior to Admission medications   Medication Sig Start Date End Date Taking? Authorizing Provider  oxyCODONE-acetaminophen (PERCOCET) 10-325 MG tablet Take 1 tablet by mouth every 6 (six) hours as needed for pain. 02/15/22  Yes [provider]  Ascorbic Acid (VITAMIN C) 1000 MG tablet Take 1,000 mg by mouth in the morning and at bedtime.    [provider]  azithromycin (ZITHROMAX) 250 MG tablet Sig as indicated 10/08/21   Horald Pollen, MD  BD PEN NEEDLE NANO 2ND GEN 32G X 4 MM MISC USE AS DIRECTED 4  TIMES A DAY 10/22/20   [name], MD  cholecalciferol (VITAMIN D3) 25 MCG (1000 UNIT) tablet Take 1,000 Units by mouth daily.    [provider]  Cyanocobalamin (VITAMIN B 12 PO) Take 2,500 mcg by mouth every Thursday.    [provider]  Dextromethorphan-guaiFENesin (MUCINEX DM MAXIMUM STRENGTH) 60-1200 MG TB12 Take 1 tablet by mouth in the morning and at bedtime.    [provider]  docusate sodium (COLACE) 100 MG capsule 1 capsule as needed    [provider]  DULoxetine (CYMBALTA) 30 MG capsule TAKE 1 CAPSULE BY MOUTH EVERY DAY Patient taking differently: Take 30 mg by mouth daily. Patient takes with 60 mg for a total dose of 90 mg 05/05/21   [name], MD  estradiol (ESTRACE) 0.1 MG/GM vaginal cream Place 1 Applicatorful vaginally See admin instructions. Takes 2 times a week 06/28/20   [provider]  feeding supplement (ENSURE ENLIVE / ENSURE PLUS) LIQD Take 237 mLs by mouth 2 (two) times daily between meals. Patient taking differently: Take 237 mLs by mouth 3 (three) times daily with meals. 01/16/20   Hongalgi, Lenis Dickinson, MD  FREESTYLE LITE test strip USE TO TEST BLOOD SUGAR 3 TIMES DAILY 06/04/21   Philemon Kingdom, MD  gabapentin (NEURONTIN) 100 MG capsule 1 capsule  [provider]  insulin glargine (LANTUS) 100 UNIT/ML Solostar Pen Inject 5 Units into the skin in the morning.    [provider]  ipratropium-albuterol (DUONEB) 0.5-2.5 (3) MG/3ML SOLN Inhale 3 mLs into the lungs every 4 (four) hours as needed. 06/26/21   Baird Lyons D, MD  Lancets (FREESTYLE) lancets Use to test blood sugar 3 times daily. Dx: E11.65 10/14/16   Philemon Kingdom, MD  levothyroxine (SYNTHROID) 50 MCG tablet TAKE 1 TABLET BY MOUTH EVERY DAY BEFORE BREAKFAST 09/08/21   [name], MD  linaclotide [facility]) 72 MCG capsule Take 1 capsule (72 mcg total) by mouth daily before breakfast. 03/11/21   Biagio Borg, MD   methenamine (MANDELAMINE) 1 g tablet Take 1,000 mg by mouth 2 (two) times daily.    [provider]  mirtazapine (REMERON) 15 MG tablet 1 tablet at bedtime.    [provider]  naloxone [facility]) nasal spray 4 mg/0.1 mL Place 1 spray into the nose once as needed (overdose). 06/27/20   [provider]  nystatin cream (MYCOSTATIN) SMARTSIG:1 sparingly Topical Twice Daily 06/26/21   [provider]  omeprazole (PRILOSEC) 20 MG capsule TAKE 1 CAPSULE BY MOUTH TWICE A DAY BEFORE A MEAL 02/15/22   Mansouraty, Telford Nab., MD  oxyCODONE-acetaminophen (PERCOCET) 7.5-325 MG tablet Take 1 tablet by mouth See admin instructions. Daughter states can take up to 5 times daily as needed    [provider]  polyethylene glycol (MIRALAX / GLYCOLAX) 17 g packet Take 17 g by mouth 2 (two) times daily. Decrease to daily or every other day pending on bowel movement frequency/consistency 04/14/21   Mariel Aloe, MD  predniSONE (DELTASONE) 1 MG tablet Take 1 tablet (1 mg total) by mouth in the morning and at bedtime. Patient taking differently: Take 1 mg by mouth every other day. This week  its Tuesday,Thursday & Saturday 03/31/21   Kathie Dike, MD  rosuvastatin (CRESTOR) 10 MG tablet TAKE 1 TABLET BY MOUTH EVERY DAY IN THE EVENING 05/05/21   [name], MD  TRELEGY ELLIPTA 100-62.5-25 MCG/ACT AEPB INHALE 1 PUFF INTO THE LUNGS DAILY. RINSE MOUTH 08/18/21   Young, Tarri Fuller D, MD  triamcinolone cream (KENALOG) 0.1 % PLEASE SEE ATTACHED FOR DETAILED DIRECTIONS 06/26/21   [provider]  TURMERIC PO Take 1,000 mg by mouth in the morning.    [provider]      Allergies    Tape, Lasix [furosemide], Latex, and Penicillins    Review of Systems   Review of Systems  Respiratory:  Positive for cough.     Physical Exam Updated Vital Signs BP 132/66 (BP Location: Right Arm)   Pulse (!) 103   Temp 99.6 F (37.6 C) (Oral)   Resp 20   Ht '4\' 11"'$  (1.499  m)   Wt 47.7 kg   LMP  (LMP Unknown)   SpO2 95%   BMI 21.24 kg/m  Physical Exam Constitutional: Alert and oriented to person and place no acute distress  eyes: Conjunctivae are normal. ENT      Head: Normocephalic and atraumatic.      Nose: No congestion.      Mouth/Throat: Mucous membranes are dry.      Neck: No stridor. Cardiovascular: S1, S2,  Normal and symmetric distal pulses are present in all extremities.Warm and well perfused. Respiratory: Tachypneic to the 30s, transmitted upper airway sounds, no significant wheezing, intermittent wet cough Gastrointestinal: Soft and nontender.  Musculoskeletal: No pitting edema of  lower extremities Neurologic: Normal speech and language.  4 out of 5 strength bilateral upper and lower extremities diffusely.  Sensation grossly intact.  No facial droop.  No focal deficits.   Skin: Skin is warm, dry and intact. No rash noted. Psychiatric: Mood and affect are normal. Speech and behavior are normal.  ED Results / Procedures / Treatments   Labs (all labs ordered are listed, but only abnormal results are displayed) Labs Reviewed  RESP PANEL BY RT-PCR (FLU A&B, COVID) ARPGX2 - Abnormal; Notable for the following components:      Result Value   Influenza A by PCR POSITIVE (*)    All other components within normal limits  BASIC METABOLIC PANEL - Abnormal; Notable for the following components:   Sodium 132 (*)    CO2 20 (*)    Glucose, Bld 211 (*)    BUN 25 (*)    Creatinine, Ser 1.14 (*)    Calcium 8.4 (*)    GFR, Estimated 47 (*)    All other components within normal limits  CBC - Abnormal; Notable for the following components:   RBC 3.20 (*)    Hemoglobin 9.6 (*)    HCT 31.0 (*)    All other components within normal limits  URINALYSIS, ROUTINE W REFLEX MICROSCOPIC - Abnormal; Notable for the following components:   APPearance CLOUDY (*)    Hgb urine dipstick MODERATE (*)    Protein, ur 30 (*)    Leukocytes,Ua LARGE (*)    WBC, UA >50  (*)    Bacteria, UA RARE (*)    Crystals PRESENT (*)    All other components within normal limits    EKG EKG Interpretation  Date/Time:  Wednesday February 17 2022 14:29:50 EST Ventricular Rate:  100 PR Interval:  116 QRS Duration: 86 QT Interval:  374 QTC Calculation: 482 R Axis:   54 Text Interpretation: Normal sinus rhythm Nonspecific ST abnormality Abnormal ECG When compared with ECG of 25-Jul-2021 12:16, PREVIOUS ECG IS PRESENT No acute changes Confirmed by Georgina Snell [phone]) on 02/17/2022 3:43:26 PM  Radiology DG Chest 2 View  Result Date: 02/17/2022 CLINICAL DATA:  SOB EXAM: CHEST - 2 VIEW COMPARISON:  07/25/2021. FINDINGS: The heart size and mediastinal contours are within normal limits. Linear scarring or subsegmental atelectasis identified right base. No evidence of pulmonary edema or pneumonia. No pneumothorax or pleural effusion. There are thoracic degenerative changes. Old bilateral rib fractures are identified, a stable finding. Aorta appears ectatic and calcified. IMPRESSION: Right base subsegmental atelectasis. No acute cardiopulmonary process identified Electronically Signed   By: Sammie Bench M.D.   On: 02/17/2022 14:56    Procedures Procedures  Remain on constant cardiac monitoring which I personally reviewed mainly remained sinus rhythm and normal rates  Medications Ordered in ED Medications  oxyCODONE (Oxy IR/ROXICODONE) immediate release tablet 10 mg (10 mg Oral Given 02/17/22 1920)  oseltamivir (TAMIFLU) capsule 75 mg (75 mg Oral Given 02/17/22 1611)  lactated ringers bolus 1,000 mL (0 mLs Intravenous Stopped 02/17/22 1831)  ipratropium-albuterol (DUONEB) 0.5-2.5 (3) MG/3ML nebulizer solution 3 mL (3 mLs Nebulization Given 02/17/22 1609)  ibuprofen (ADVIL) tablet 400 mg (400 mg Oral Given 02/17/22 2127)    ED Course/ Medical Decision Making/ A&P Clinical Course as of 02/17/22 2341  Wed Feb 17, 2022  1630 Spoke with Dr Karleen Hampshire who has accepted  for admission to tele bed. [VB]    Clinical Course User Index [VB] Elgie Congo, MD  Medical Decision Making  SOPHI CALLIGAN is a 86 y.o. female.  With PMH of DM2, HTN, HLD, GERD, COPD/asthma who presents with cough and fever and failure to thrive.   Based on patient's illness symptoms, consider viral URI versus possible bacterial pneumonia or UTI contributing to generalized fatigue and weakness as well as failure to thrive.  She has no localizing deficits on exam, no concern for stroke.  She has an EKG that is sinus tachycardia with no acute changes from prior EKG without chest pain, doubt ACS.  Workup obtained and notable for positive influenza A test.  Chest x-ray obtained which I personally reviewed showed no consolidation concerning for superimposed bacterial pneumonia, no pneumothorax, no pleural effusion.  She is mildly tachypneic on exam but not significantly wheezy, doubt COPD exacerbation but will give DuoNeb.  Her labs were also remarkable for AKI creatinine 1.14 with elevated BUN 25 up from baseline creatinine 0.6-0.8.  She also has mild hyponatremia that is 132 and hypovolemic in nature.  Mild anemia hemoglobin 9.6.  Due to patient's AKI, influenza and increased work of breathing will admit for continued supportive care and monitoring and PT OT due to significant change in baseline ambulatory status.  Discussed with on-call hospitalist who is excepted patient for admission for continued care of reported above.  Amount and/or Complexity of Data Reviewed Labs: ordered. Radiology: ordered.  Risk Prescription drug management. Decision regarding hospitalization.    Final Clinical Impression(s) / ED Diagnoses Final diagnoses:  Influenza A  AKI (acute kidney injury) [facility])    Rx /  Orders ED Discharge Orders     None         Elgie Congo, MD 02/17/22 2341

## 2022-02-17 NOTE — ED Triage Notes (Signed)
Patient here POV from Home with Daughter.  Daughter endorses being Ill recently with Cold-Like Symptoms such as a Cough and Fever. Soon after the Patient became Ill with Productive Cough, Weakness. This has been occurring since Friday. Some Disorientation noted by Daughter.   NAD Noted during Triage. A&Ox3. GCS 15. BIB Wheelchair.

## 2022-02-17 NOTE — Progress Notes (Signed)
Kayla Thompson is a 86 y.o. female with  h/o type 2 DM,  HTN, HLD, GERD, COPD/asthma  presents with cough and fever and failure to thrive.  She was found to be in mild AKI, influenza positive, failure to thrive, generalized weakness over the last few days, unable to do daily ADL'S.   She was admitted for symptomatic management with IV Fluids, tamiflu.   She is accepted to telemetry bed at [facility].    Hosie Poisson, MD

## 2022-02-18 DIAGNOSIS — E1165 Type 2 diabetes mellitus with hyperglycemia: Secondary | ICD-10-CM | POA: Diagnosis not present

## 2022-02-18 DIAGNOSIS — J111 Influenza due to unidentified influenza virus with other respiratory manifestations: Secondary | ICD-10-CM | POA: Diagnosis not present

## 2022-02-18 DIAGNOSIS — I5032 Chronic diastolic (congestive) heart failure: Secondary | ICD-10-CM | POA: Diagnosis not present

## 2022-02-18 DIAGNOSIS — N179 Acute kidney failure, unspecified: Secondary | ICD-10-CM | POA: Diagnosis not present

## 2022-02-18 LAB — GLUCOSE, CAPILLARY
Glucose-Capillary: 100 mg/dL — ABNORMAL HIGH (ref 70–99)
Glucose-Capillary: 244 mg/dL — ABNORMAL HIGH (ref 70–99)
Glucose-Capillary: 199 mg/dL — ABNORMAL HIGH (ref 70–99)
Glucose-Capillary: 197 mg/dL — ABNORMAL HIGH (ref 70–99)

## 2022-02-18 LAB — CBC WITH DIFFERENTIAL/PLATELET
Abs Immature Granulocytes: 0.04 10*3/uL (ref 0.00–0.07)
Basophils Absolute: 0 10*3/uL (ref 0.0–0.1)
Basophils Relative: 0 %
Eosinophils Absolute: 0.1 10*3/uL (ref 0.0–0.5)
Eosinophils Relative: 2 %
HCT: 29.6 % — ABNORMAL LOW (ref 36.0–46.0)
Hemoglobin: 9.3 g/dL — ABNORMAL LOW (ref 12.0–15.0)
Immature Granulocytes: 1 %
Lymphocytes Relative: 43 %
Lymphs Abs: 3.3 10*3/uL (ref 0.7–4.0)
MCH: 30.4 pg (ref 26.0–34.0)
MCHC: 31.4 g/dL (ref 30.0–36.0)
MCV: 96.7 fL (ref 80.0–100.0)
Monocytes Absolute: 0.7 10*3/uL (ref 0.1–1.0)
Monocytes Relative: 9 %
Neutro Abs: 3.5 10*3/uL (ref 1.7–7.7)
Neutrophils Relative %: 45 %
Platelets: 194 10*3/uL (ref 150–400)
RBC: 3.06 MIL/uL — ABNORMAL LOW (ref 3.87–5.11)
RDW: 13.9 % (ref 11.5–15.5)
WBC: 7.7 10*3/uL (ref 4.0–10.5)
nRBC: 0 % (ref 0.0–0.2)

## 2022-02-18 LAB — BASIC METABOLIC PANEL
Anion gap: 9 (ref 5–15)
BUN: 24 mg/dL — ABNORMAL HIGH (ref 8–23)
CO2: 22 mmol/L (ref 22–32)
Calcium: 8.5 mg/dL — ABNORMAL LOW (ref 8.9–10.3)
Chloride: 105 mmol/L (ref 98–111)
Creatinine, Ser: 1.08 mg/dL — ABNORMAL HIGH (ref 0.44–1.00)
GFR, Estimated: 51 mL/min — ABNORMAL LOW (ref >60)
Glucose, Bld: 126 mg/dL — ABNORMAL HIGH (ref 70–99)
Potassium: 3.5 mmol/L (ref 3.5–5.1)
Sodium: 136 mmol/L (ref 135–145)

## 2022-02-18 LAB — RETICULOCYTES
Immature Retic Fract: 9.5 % (ref 2.3–15.9)
RBC.: 3.18 MIL/uL — ABNORMAL LOW (ref 3.87–5.11)
Retic Count, Absolute: 30.2 10*3/uL (ref 19.0–186.0)
Retic Ct Pct: 1 % (ref 0.4–3.1)

## 2022-02-18 LAB — IRON AND TIBC
Iron: 23 ug/dL — ABNORMAL LOW (ref 28–170)
Saturation Ratios: 8 % — ABNORMAL LOW (ref 10.4–31.8)
TIBC: 287 ug/dL (ref 250–450)
UIBC: 264 ug/dL

## 2022-02-18 LAB — PATHOLOGIST SMEAR REVIEW

## 2022-02-18 LAB — FERRITIN: Ferritin: 90 ng/mL (ref 11–307)

## 2022-02-18 LAB — VITAMIN B12: Vitamin B-12: 667 pg/mL (ref 180–914)

## 2022-02-18 LAB — MRSA NEXT GEN BY PCR, NASAL: MRSA by PCR Next Gen: NOT DETECTED

## 2022-02-18 LAB — FOLATE: Folate: 42.4 ng/mL (ref >5.9)

## 2022-02-18 MED ORDER — ROSUVASTATIN CALCIUM 10 MG PO TABS
10.0000 mg | ORAL_TABLET | Freq: Every evening | ORAL | Status: DC
  Administered 2022-02-18 – 2022-02-23 (×6): 10 mg via ORAL
  Filled 2022-02-18 (×6): qty 1

## 2022-02-18 MED ORDER — SODIUM CHLORIDE 0.9 % IV SOLN
500.0000 mg | INTRAVENOUS | Status: AC
  Administered 2022-02-18 – 2022-02-20 (×3): 500 mg via INTRAVENOUS
  Filled 2022-02-18 (×3): qty 5

## 2022-02-18 MED ORDER — PREDNISONE 1 MG PO TABS: 1.0000 mg | ORAL_TABLET | ORAL | Status: DC

## 2022-02-18 MED ORDER — DULOXETINE HCL 60 MG PO CPEP
90.0000 mg | ORAL_CAPSULE | Freq: Every day | ORAL | Status: DC
  Administered 2022-02-18 – 2022-02-22 (×5): 90 mg via ORAL
  Filled 2022-02-18 (×5): qty 1

## 2022-02-18 MED ORDER — LEVOTHYROXINE SODIUM 50 MCG PO TABS
50.0000 ug | ORAL_TABLET | Freq: Every day | ORAL | Status: DC
  Administered 2022-02-18 – 2022-02-24 (×6): 50 ug via ORAL
  Filled 2022-02-18 (×7): qty 1

## 2022-02-18 MED ORDER — OSELTAMIVIR PHOSPHATE 30 MG PO CAPS
30.0000 mg | ORAL_CAPSULE | Freq: Every day | ORAL | Status: AC
  Administered 2022-02-18 – 2022-02-21 (×4): 30 mg via ORAL
  Filled 2022-02-18 (×4): qty 1

## 2022-02-18 MED ORDER — DOCUSATE SODIUM 100 MG PO CAPS
100.0000 mg | ORAL_CAPSULE | Freq: Every day | ORAL | Status: DC
  Administered 2022-02-18 – 2022-02-24 (×7): 100 mg via ORAL
  Filled 2022-02-18 (×7): qty 1

## 2022-02-18 MED ORDER — PREDNISONE 1 MG PO TABS
1.0000 mg | ORAL_TABLET | ORAL | Status: DC
  Administered 2022-02-19 – 2022-02-24 (×3): 1 mg via ORAL
  Filled 2022-02-18 (×3): qty 1

## 2022-02-18 MED ORDER — IPRATROPIUM-ALBUTEROL 0.5-2.5 (3) MG/3ML IN SOLN: 3.0000 mL | RESPIRATORY_TRACT | Status: DC | PRN

## 2022-02-18 MED ORDER — ONDANSETRON HCL 4 MG PO TABS: 4.0000 mg | ORAL_TABLET | Freq: Four times a day (QID) | ORAL | Status: DC | PRN

## 2022-02-18 MED ORDER — INSULIN ASPART 100 UNIT/ML IJ SOLN
0.0000 [IU] | Freq: Every day | INTRAMUSCULAR | Status: DC
  Administered 2022-02-22: 2 [IU] via SUBCUTANEOUS

## 2022-02-18 MED ORDER — SENNOSIDES-DOCUSATE SODIUM 8.6-50 MG PO TABS: 1.0000 | ORAL_TABLET | Freq: Every evening | ORAL | Status: DC | PRN

## 2022-02-18 MED ORDER — POLYETHYLENE GLYCOL 3350 17 G PO PACK
17.0000 g | PACK | ORAL | Status: DC
  Administered 2022-02-18 – 2022-02-24 (×4): 17 g via ORAL
  Filled 2022-02-18 (×4): qty 1

## 2022-02-18 MED ORDER — ENOXAPARIN SODIUM 30 MG/0.3ML IJ SOSY
30.0000 mg | PREFILLED_SYRINGE | INTRAMUSCULAR | Status: DC
  Administered 2022-02-18 – 2022-02-24 (×7): 30 mg via SUBCUTANEOUS
  Filled 2022-02-18 (×7): qty 0.3

## 2022-02-18 MED ORDER — ACETAMINOPHEN 650 MG RE SUPP: 650.0000 mg | Freq: Four times a day (QID) | RECTAL | Status: DC | PRN

## 2022-02-18 MED ORDER — SENNOSIDES 8.6 MG PO TABS: 1.0000 | ORAL_TABLET | Freq: Every day | ORAL | Status: DC

## 2022-02-18 MED ORDER — LACTATED RINGERS IV SOLN: INTRAVENOUS | Status: DC

## 2022-02-18 MED ORDER — FLUTICASONE FUROATE-VILANTEROL 100-25 MCG/ACT IN AEPB
1.0000 | INHALATION_SPRAY | Freq: Every day | RESPIRATORY_TRACT | Status: DC
  Administered 2022-02-18 – 2022-02-24 (×7): 1 via RESPIRATORY_TRACT
  Filled 2022-02-18: qty 28

## 2022-02-18 MED ORDER — INSULIN ASPART 100 UNIT/ML IJ SOLN
0.0000 [IU] | Freq: Three times a day (TID) | INTRAMUSCULAR | Status: DC
  Administered 2022-02-18: 5 [IU] via SUBCUTANEOUS
  Administered 2022-02-18: 3 [IU] via SUBCUTANEOUS
  Administered 2022-02-19: 2 [IU] via SUBCUTANEOUS
  Administered 2022-02-19 – 2022-02-22 (×5): 5 [IU] via SUBCUTANEOUS
  Administered 2022-02-23: 2 [IU] via SUBCUTANEOUS
  Administered 2022-02-23: 3 [IU] via SUBCUTANEOUS
  Administered 2022-02-23: 2 [IU] via SUBCUTANEOUS
  Administered 2022-02-24: 5 [IU] via SUBCUTANEOUS
  Administered 2022-02-24: 11 [IU] via SUBCUTANEOUS

## 2022-02-18 MED ORDER — ACETAMINOPHEN 325 MG PO TABS
650.0000 mg | ORAL_TABLET | Freq: Four times a day (QID) | ORAL | Status: DC | PRN
  Administered 2022-02-24: 650 mg via ORAL
  Filled 2022-02-18: qty 2

## 2022-02-18 MED ORDER — ONDANSETRON HCL 4 MG/2ML IJ SOLN: 4.0000 mg | Freq: Four times a day (QID) | INTRAMUSCULAR | Status: DC | PRN

## 2022-02-18 MED ORDER — DULOXETINE HCL 60 MG PO CPEP: 60.0000 mg | ORAL_CAPSULE | Freq: Every day | ORAL | Status: DC

## 2022-02-18 MED ORDER — PANTOPRAZOLE SODIUM 40 MG PO TBEC
40.0000 mg | DELAYED_RELEASE_TABLET | Freq: Every day | ORAL | Status: DC
  Administered 2022-02-18 – 2022-02-24 (×7): 40 mg via ORAL
  Filled 2022-02-18 (×7): qty 1

## 2022-02-18 MED ORDER — INSULIN GLARGINE-YFGN 100 UNIT/ML ~~LOC~~ SOLN
5.0000 [IU] | Freq: Every day | SUBCUTANEOUS | Status: DC
  Administered 2022-02-18 – 2022-02-24 (×7): 5 [IU] via SUBCUTANEOUS
  Filled 2022-02-18 (×7): qty 0.05

## 2022-02-18 MED ORDER — UMECLIDINIUM BROMIDE 62.5 MCG/ACT IN AEPB
1.0000 | INHALATION_SPRAY | Freq: Every day | RESPIRATORY_TRACT | Status: DC
  Administered 2022-02-18 – 2022-02-24 (×7): 1 via RESPIRATORY_TRACT
  Filled 2022-02-18 (×2): qty 7

## 2022-02-18 MED ORDER — SENNA 8.6 MG PO TABS
1.0000 | ORAL_TABLET | Freq: Every day | ORAL | Status: DC
  Administered 2022-02-18 – 2022-02-24 (×7): 8.6 mg via ORAL
  Filled 2022-02-18 (×7): qty 1

## 2022-02-18 MED ORDER — ENSURE ENLIVE PO LIQD
237.0000 mL | Freq: Two times a day (BID) | ORAL | Status: DC
  Administered 2022-02-18 – 2022-02-24 (×8): 237 mL via ORAL

## 2022-02-18 NOTE — Progress Notes (Signed)
Order for swallow eval received. Pt familiar to this SLP from prior MBS - earlier this year.  Has h/o silent mild aspiration - prevented with chin tuck posture as well as esophageal dysmotility. Pending swallow evaluation, recommend pt tuck her chin with liquids and take medications with applesauce per prior MBS advise.  Thanks, sent RN secure chat with recommendations.   Kathleen Lime, MS [facility] SLP Acute Rehab Services Office [phone] Pager [phone]

## 2022-02-18 NOTE — Progress Notes (Signed)
Initial Nutrition Assessment  DOCUMENTATION CODES:   Severe malnutrition in context of chronic illness  INTERVENTION:  - Liberalize diet to Regular to avoid restricting intake.  - Continue Ensure Enlive po BID, each supplement provides 350 kcal and 20 grams of protein. - Encourage intake at all meals and of supplements.   NUTRITION DIAGNOSIS:   Severe Malnutrition related to chronic illness (CHF, COPD) as evidenced by severe fat depletion, severe muscle depletion.  GOAL:   Patient will meet greater than or equal to 90% of their needs  MONITOR:   PO intake, Supplement acceptance, Weight trends  REASON FOR ASSESSMENT:   Malnutrition Screening Tool    ASSESSMENT:   86 y.o. female with PMHx of DM type 2, anxiety and depression, hypothyroidism, CHF, HTN, HLD, GERD, RA, chronic UTIs, and COPD/asthma on intermittent oxygen. The patient lives with her daughter who recently had a viral illness.  Subsequent to that the patient developed a cough productive of greenish sputum, decreased appetite and p.o. intake and was admitted for weakness, found to have influenza.  Met with patient at bedside. Caregiver at bedside who reported most of patients history.  UBW reported to be 101#. Caregiver states patient was 95# when she first started taking care of her in January and was able to gain to 105# by September before possibly losing a little recently due to being sick. EMR confirms weight trends however patient admitted at 105# which is the same as weight in July.  Patient usually eats a good sized meal at breakfast and lunch but caregiver notes dinner depends on what is made as patient can be picky. Thankfully, patient consumes Ensure regularly at home. At least once a day or up to 2 if she doesn't eat a lot at dinner. Patient already ordered Ensure and has been consuming. Patient reports her current appetite is good. Encouraged intake at all meals and of supplements to avoid weight loss during  admission.  Medications reviewed and include: Insulin, Synthroid  Labs reviewed:  Creatinine 1.08 HA1C 6.5   NUTRITION - FOCUSED PHYSICAL EXAM:  Flowsheet Row Most Recent Value  Orbital Region Severe depletion  Upper Arm Region Severe depletion  Thoracic and Lumbar Region Severe depletion  Buccal Region Severe depletion  Temple Region Severe depletion  Clavicle Bone Region Severe depletion  Clavicle and Acromion Bone Region Severe depletion  Scapular Bone Region Unable to assess  Dorsal Hand Severe depletion  Patellar Region Severe depletion  Anterior Thigh Region Severe depletion  Posterior Calf Region Severe depletion  Edema (RD Assessment) None  Hair Reviewed  Eyes Reviewed  Mouth Reviewed  Skin Reviewed  Nails Reviewed       Diet Order:   Diet Order             Diet regular Room service appropriate? Yes; Fluid consistency: Thin  Diet effective now                   EDUCATION NEEDS:  No education needs have been identified at this time  Skin:  Skin Assessment: Reviewed RN Assessment  Last BM:  unknown  Height:  Ht Readings from Last 1 Encounters:  02/17/22 _0  (1.626 m)   Weight:  Wt Readings from Last 1 Encounters:  02/17/22 47.7 kg    BMI:  Body mass index is 18.05 kg/m.  Estimated Nutritional Needs:  Kcal:  1650-1800 kcals Protein:  70-95 grams Fluid:  >/= 1.6L   Samson Frederic RD, LDN For contact information, refer to  [facility].

## 2022-02-18 NOTE — TOC Initial Note (Signed)
Transition of Care [facility]) - Initial/Assessment Note    Patient Details  Name: Kayla Thompson MRN: [id number] Date of Birth: Sep 16, 1935  Transition of Care [facility] CM/SW Contact:    Leeroy Cha, RN Phone Number: 02/18/2022, 8:53 AM  Clinical Narrative:                  Transition of Care [facility]) Screening Note   Patient Details  Name: Kayla Thompson Date of Birth: 1935/05/21   Transition of Care [facility] CM/SW Contact:    Leeroy Cha, RN Phone Number: 02/18/2022, 8:54 AM    Transition of Care Department [facility]) has reviewed patient and no TOC needs have been identified at this time. We will continue to monitor patient advancement through interdisciplinary progression rounds. If new patient transition needs arise, please place a TOC consult.    Expected Discharge Plan: Home/Self Care Barriers to Discharge: Continued Medical Work up   Patient Goals and CMS Choice Patient states their goals for this hospitalization and ongoing recovery are:: to return to my home CMS Medicare.gov Compare Post Acute Care list provided to:: Patient    Expected Discharge Plan and Services Expected Discharge Plan: Home/Self Care   Discharge Planning Services: CM Consult   Living arrangements for the past 2 months: Single Family Home                                      Prior Living Arrangements/Services Living arrangements for the past 2 months: Single Family Home Lives with:: Self Patient language and need for interpreter reviewed:: Yes Do you feel safe going back to the place where you live?: Yes            Criminal Activity/Legal Involvement Pertinent to Current Situation/Hospitalization: No - Comment as needed  Activities of Daily Living Home Assistive Devices/Equipment: Wheelchair ADL Screening (condition at time of admission) Patient's cognitive ability adequate to safely complete daily activities?: Yes Is the patient deaf or have difficulty hearing?:  No Does the patient have difficulty seeing, even when wearing glasses/contacts?: No Does the patient have difficulty concentrating, remembering, or making decisions?: No Patient able to express need for assistance with ADLs?: Yes Does the patient have difficulty dressing or bathing?: No Independently performs ADLs?: No Communication: Independent Dressing (OT): Independent Grooming: Independent Feeding: Needs assistance Is this a change from baseline?: Pre-admission baseline Bathing: Needs assistance Is this a change from baseline?: Pre-admission baseline Toileting: Needs assistance Is this a change from baseline?: Pre-admission baseline In/Out Bed: Needs assistance Is this a change from baseline?: Pre-admission baseline Walks in Home: Independent with device (comment) Does the patient have difficulty walking or climbing stairs?: Yes Weakness of Legs: Both Weakness of Arms/Hands: Both  Permission Sought/Granted                  Emotional Assessment Appearance:: Appears stated age Attitude/Demeanor/Rapport: Gracious Affect (typically observed): Calm Orientation: : Oriented to Self, Oriented to Place, Oriented to  Time, Oriented to Situation Alcohol / Substance Use: Never Used Psych Involvement: No (comment)  Admission diagnosis:  Influenza A [J10.1] AKI (acute kidney injury) ([place]) [N17.9] Influenza [J11.1] Patient Active Problem List   Diagnosis Date Noted   AKI (acute kidney injury) ([place]) 02/18/2022   Influenza 02/17/2022   Lower respiratory infection 10/08/2021   Acute cystitis without hematuria 08/14/2021   Chronic respiratory failure with hypoxia ([place]) 07/28/2021   Anemia of chronic disease  07/28/2021   Macrocytosis 07/28/2021   Urinary tract infection due to extended-spectrum beta lactamase (ESBL) producing Escherichia coli 07/25/2021   Vulvovaginal candidiasis 06/27/2021   Palliative care encounter 04/26/2021   Fecal impaction ([place]) 04/12/2021   Hypokalemia  04/12/2021   Positive blood culture 04/03/2021   Constipation 03/11/2021   Weight loss 03/11/2021   COPD (chronic obstructive pulmonary disease) ([place]) 11/20/2020   Other chronic pain    Hypertension associated with diabetes ([place]) 05/07/2020   Hyperlipidemia associated with type 2 diabetes mellitus ([place]) 05/07/2020   Depression 05/07/2020   PAD (peripheral artery disease) ([place]) 02/05/2020   Upper esophageal web 03/27/2019   Iron deficiency anemia 03/27/2019   Candida esophagitis ([place]) 02/14/2019   UTI (urinary tract infection) 01/25/2019   Yeast cystitis 01/25/2019   Encounter for general adult medical examination with abnormal findings 01/09/2019   History of colonic polyps 12/28/2018   Dysphagia 12/28/2018   Diabetic foot ulcer ([place]) 12/05/2018   Rotator cuff arthropathy, left 11/16/2018   Degenerative arthritis of left knee 11/16/2018   Insomnia 09/26/2018   Bronchiectasis with (acute) exacerbation ([place]) 03/20/2018   Leukocytosis 10/17/2017   Severe protein-calorie malnutrition ([place]) 10/10/2017   Memory changes 07/29/2017   Granulomatous lung disease ([place]) 06/14/2017   Tracheobronchomalacia 06/14/2017   Dizziness 03/04/2017   Exercise hypoxemia 07/12/2016   Chronic heart failure with preserved ejection fraction ([place]) 12/25/2015   GERD (gastroesophageal reflux disease) 12/25/2015   Epidermal inclusion cyst 09/02/2015   Muscle cramps 03/06/2015   Cough 01/07/2015   Bronchiectasis without acute exacerbation ([place]) 01/07/2015   PMR (polymyalgia rheumatica) ([place]) 01/07/2015   Degenerative joint disease involving multiple joints 01/07/2015   Asthma, chronic 11/12/2014   Anxiety state 11/12/2014   Hyperparathyroidism ([place]) 10/28/2014   Fibromyalgia 09/21/2014   Personal history of ovarian cancer 08/23/2014   Osteoporosis 08/23/2014   DM2 (diabetes mellitus, type 2) ([place]) 08/02/2014   HLD (hyperlipidemia) 08/02/2014   Hypothyroidism 08/02/2014   PCP:  [name],  MD Pharmacy:   [id number]Phone: [phone]: [address] [place] [address] [address]Phone: [phone]: [phone]    Social Determinants of Health (SDOH) Interventions    Readmission Risk Interventions   Row Labels 07/27/2021    2:37 PM 04/13/2021    9:53 AM 01/12/2021    9:32 AM  Readmission Risk Prevention Plan   Section Header. No data exists in this row.     Transportation Screening   Complete Complete Complete  PCP or Specialist Appt within 3-5 Days   Complete  Complete  [place] or [place]  Complete  Complete  Social Work Consult for [place]Counsel[place]   Complete    Palliative Care Screening   Not Applicable  Not Applicable  Medication Review (Press photographer   Complete Complete Complete  PCP or Specialist appointment within 3-5 days of discharge    Complete   [place]RI or [place]   Complete   SW Recovery Care/Counseling Consult    Complete   Palliative Care Screening    [place]   Not Applicable

## 2022-02-18 NOTE — Progress Notes (Signed)
[facility]                                                                               [name], is a 86 y.o. female, DOB - [birth year], FKC:[id number] Admit date - 02/17/2022    Outpatient Primary MD for the patient is [name], MD  LOS - 1  days    Brief summary   This is a  86 y.o. female with PMHx of DM type 2, anxiety and depression, hypothyroidism, CHF, HTN, HLD, GERD, RA, chronic UTIs, and COPD/asthma on intermittent oxygen. The patient was diagnosed with influenza. The hospitalist have been asked to admit   Assessment & Plan    Assessment and Plan:   Influenza infection:  Symptomatic management with tamiflu, nebs and fluids.     AKI; Gently hydrate and repeat renal parameters improved.  Creatinine at baseline is less than 1.  Creatinine at 1.1 and improved to 1.08.  Continue to monitor.   Chronic diastolic CHF:  She appears dehydrated.    Hypothyroidism:  Resume home meds.    Hypertension:  Well controlled BP parameters.    Hyperlipidemia:  Resume statin.    COPD: no wheezing heard.     Anemia of chronic disease:  Hemoglobin stable at 9.    Estimated body mass index is 18.05 kg/m as calculated from the following:   Height as of this encounter: '5\' 4"'$  (1.626 m).   Weight as of this encounter: 47.7 kg.  Code Status: DNR DVT Prophylaxis:  enoxaparin (LOVENOX) injection 30 mg Start: 02/18/22 1000 SCDs Start: 02/18/22 0007   Level of Care: Level of care: Telemetry Family Communication: caregiver at bedside.   Disposition Plan:     Remains inpatient appropriate:  Iv antibiotics, IV fluids.   Procedures:  None.   Consultants:   None.   Antimicrobials:   Anti-infectives (From admission, onward)    Start     Dose/Rate Route Frequency Ordered Stop   02/18/22 1000  oseltamivir (TAMIFLU) capsule 30 mg        30 mg Oral Daily 02/18/22 0006 02/22/22 0959   02/18/22 1000  azithromycin (ZITHROMAX) 500 mg in  sodium chloride 0.9 % 250 mL IVPB        500 mg 250 mL/hr over 60 Minutes Intravenous Every 24 hours 02/18/22 0816 02/21/22 0959   02/17/22 1615  oseltamivir (TAMIFLU) capsule 75 mg        75 mg Oral  Once 02/17/22 1604 02/17/22 1611        Medications  Scheduled Meds:  enoxaparin (LOVENOX) injection  30 mg Subcutaneous Q24H   feeding supplement  237 mL Oral BID BM   fluticasone furoate-vilanterol  1 puff Inhalation Daily   And   umeclidinium bromide  1 puff Inhalation Daily   insulin aspart  0-15 Units Subcutaneous TID WC   insulin aspart  0-5 Units Subcutaneous QHS   levothyroxine  50 mcg Oral Q0600   oseltamivir  30 mg Oral Daily   Continuous Infusions:  azithromycin 500 mg (02/18/22 0932)   PRN Meds:.acetaminophen **OR** acetaminophen, ipratropium-albuterol, ondansetron **OR** ondansetron (ZOFRAN) IV, oxyCODONE, senna-docusate    Subjective:   Kayla Thompson  Bartus was seen and examined today.   Still coughing.    Objective:   Vitals:   02/17/22 2159 02/18/22 0212 02/18/22 0601 02/18/22 0742  BP: 132/66 111/61 109/61   Pulse: (!) 103 77 74   Resp: '20 18 18   '$ Temp: 99.6 F (37.6 C) 97.9 F (36.6 C) 98 F (36.7 C)   TempSrc: Oral Oral Oral   SpO2: 95% 96% 94% 94%  Weight:      Height: '5\' 4"'$  (1.626 m)       Intake/Output Summary (Last 24 hours) at 02/18/2022 1120 Last data filed at 02/18/2022 0605 Gross per 24 hour  Intake --  Output 650 ml  Net -650 ml   Filed Weights   02/17/22 1417  Weight: 47.7 kg     Exam General exam: Appears calm and comfortable  Respiratory system: Clear to auscultation. Respiratory effort normal. Cardiovascular system: S1 & S2 heard, RRR.  Gastrointestinal system: Abdomen is nondistended, soft and nontender. Central nervous system: Alert and oriented. No focal neurological deficits. Extremities: no pedal edema.  Skin: No rashes, lesions or ulcers Psychiatry: MOOD IS Appropriate.    Data Reviewed:  I have personally reviewed  following labs and imaging studies   CBC Lab Results  Component Value Date   WBC 7.7 02/18/2022   RBC 3.06 (L) 02/18/2022   HGB 9.3 (L) 02/18/2022   HCT 29.6 (L) 02/18/2022   MCV 96.7 02/18/2022   MCH 30.4 02/18/2022   PLT 194 02/18/2022   MCHC 31.4 02/18/2022   RDW 13.9 02/18/2022   LYMPHSABS 3.3 02/18/2022   MONOABS 0.7 02/18/2022   EOSABS 0.1 02/18/2022   BASOSABS 0.0 59/93/5701     Last metabolic panel Lab Results  Component Value Date   NA 136 02/18/2022   K 3.5 02/18/2022   CL 105 02/18/2022   CO2 22 02/18/2022   BUN 24 (H) 02/18/2022   CREATININE 1.08 (H) 02/18/2022   GLUCOSE 126 (H) 02/18/2022   GFRNONAA 51 (L) 02/18/2022   GFRAA 72 01/08/2020   CALCIUM 8.5 (L) 02/18/2022   PHOS 3.1 07/27/2021   PROT 6.0 (L) 07/28/2021   ALBUMIN 3.0 (L) 07/28/2021   BILITOT 0.6 07/28/2021   ALKPHOS 51 07/28/2021   AST 41 07/28/2021   ALT 54 (H) 07/28/2021   ANIONGAP 9 02/18/2022    CBG (last 3)  Recent Labs    02/18/22 0907  GLUCAP 100*      Coagulation Profile: No results for input(s): "INR", "PROTIME" in the last 168 hours.   Radiology Studies: DG Chest 2 View  Result Date: 02/17/2022 CLINICAL DATA:  SOB EXAM: CHEST - 2 VIEW COMPARISON:  07/25/2021. FINDINGS: The heart size and mediastinal contours are within normal limits. Linear scarring or subsegmental atelectasis identified right base. No evidence of pulmonary edema or pneumonia. No pneumothorax or pleural effusion. There are thoracic degenerative changes. Old bilateral rib fractures are identified, a stable finding. Aorta appears ectatic and calcified. IMPRESSION: Right base subsegmental atelectasis. No acute cardiopulmonary process identified Electronically Signed   By: Sammie Bench M.D.   On: 02/17/2022 14:56       Hosie Poisson M.D. [facility] 02/18/2022, 11:20 AM  Available via Epic secure chat 7am-7pm After 7 pm, please refer to night coverage provider listed on amion.

## 2022-02-19 ENCOUNTER — Ambulatory Visit: Payer: Medicare Other | Admitting: Internal Medicine

## 2022-02-19 DIAGNOSIS — E1165 Type 2 diabetes mellitus with hyperglycemia: Secondary | ICD-10-CM | POA: Diagnosis not present

## 2022-02-19 DIAGNOSIS — I5032 Chronic diastolic (congestive) heart failure: Secondary | ICD-10-CM | POA: Diagnosis not present

## 2022-02-19 DIAGNOSIS — J111 Influenza due to unidentified influenza virus with other respiratory manifestations: Secondary | ICD-10-CM | POA: Diagnosis not present

## 2022-02-19 DIAGNOSIS — N179 Acute kidney failure, unspecified: Secondary | ICD-10-CM | POA: Diagnosis not present

## 2022-02-19 LAB — CBC WITH DIFFERENTIAL/PLATELET
Abs Immature Granulocytes: 0.06 10*3/uL (ref 0.00–0.07)
Basophils Absolute: 0 10*3/uL (ref 0.0–0.1)
Basophils Relative: 0 %
Eosinophils Absolute: 0.3 10*3/uL (ref 0.0–0.5)
Eosinophils Relative: 2 %
HCT: 30.6 % — ABNORMAL LOW (ref 36.0–46.0)
Hemoglobin: 9.3 g/dL — ABNORMAL LOW (ref 12.0–15.0)
Immature Granulocytes: 1 %
Lymphocytes Relative: 34 %
Lymphs Abs: 4.1 10*3/uL — ABNORMAL HIGH (ref 0.7–4.0)
MCH: 30.7 pg (ref 26.0–34.0)
MCHC: 30.4 g/dL (ref 30.0–36.0)
MCV: 101 fL — ABNORMAL HIGH (ref 80.0–100.0)
Monocytes Absolute: 1.7 10*3/uL — ABNORMAL HIGH (ref 0.1–1.0)
Monocytes Relative: 14 %
Neutro Abs: 6.2 10*3/uL (ref 1.7–7.7)
Neutrophils Relative %: 49 %
Platelets: 185 10*3/uL (ref 150–400)
RBC: 3.03 MIL/uL — ABNORMAL LOW (ref 3.87–5.11)
RDW: 13.9 % (ref 11.5–15.5)
WBC: 12.3 10*3/uL — ABNORMAL HIGH (ref 4.0–10.5)
nRBC: 0 % (ref 0.0–0.2)

## 2022-02-19 LAB — GLUCOSE, CAPILLARY
Glucose-Capillary: 140 mg/dL — ABNORMAL HIGH (ref 70–99)
Glucose-Capillary: 209 mg/dL — ABNORMAL HIGH (ref 70–99)
Glucose-Capillary: 221 mg/dL — ABNORMAL HIGH (ref 70–99)
Glucose-Capillary: 177 mg/dL — ABNORMAL HIGH (ref 70–99)

## 2022-02-19 LAB — EXPECTORATED SPUTUM ASSESSMENT W GRAM STAIN, RFLX TO RESP C

## 2022-02-19 LAB — BASIC METABOLIC PANEL
Anion gap: 9 (ref 5–15)
BUN: 19 mg/dL (ref 8–23)
CO2: 21 mmol/L — ABNORMAL LOW (ref 22–32)
Calcium: 8 mg/dL — ABNORMAL LOW (ref 8.9–10.3)
Chloride: 103 mmol/L (ref 98–111)
Creatinine, Ser: 1.03 mg/dL — ABNORMAL HIGH (ref 0.44–1.00)
GFR, Estimated: 54 mL/min — ABNORMAL LOW (ref >60)
Glucose, Bld: 146 mg/dL — ABNORMAL HIGH (ref 70–99)
Potassium: 3.4 mmol/L — ABNORMAL LOW (ref 3.5–5.1)
Sodium: 133 mmol/L — ABNORMAL LOW (ref 135–145)

## 2022-02-19 LAB — HEMOGLOBIN A1C
Hgb A1c MFr Bld: 7.9 % — ABNORMAL HIGH (ref 4.8–5.6)
Mean Plasma Glucose: 180 mg/dL

## 2022-02-19 MED ORDER — FOOD THICKENER (SIMPLYTHICK): 1.0000 | ORAL | Status: DC | PRN

## 2022-02-19 MED ORDER — POTASSIUM CHLORIDE CRYS ER 20 MEQ PO TBCR
40.0000 meq | EXTENDED_RELEASE_TABLET | Freq: Once | ORAL | Status: AC
  Administered 2022-02-19: 40 meq via ORAL
  Filled 2022-02-19: qty 2

## 2022-02-19 NOTE — Evaluation (Signed)
Clinical/Bedside Swallow Evaluation Patient Details  Name: Kayla Thompson MRN: [id number] Date of Birth: 1935/10/20  Today's Date: 02/19/2022 Time: SLP Start Time (ACUTE ONLY): 2202 SLP Stop Time (ACUTE ONLY): 5427 SLP Time Calculation (min) (ACUTE ONLY): 30 min  Past Medical History:  Past Medical History:  Diagnosis Date   Anxiety    Arthritis    Asthma    Cataracts, bilateral    removed by surgery   COPD (chronic obstructive pulmonary disease) ([place])    Depression    Diabetes mellitus without complication (HCC)    type 2   Dyspnea    with exertion   Fibromyalgia    GERD (gastroesophageal reflux disease)    History of chemotherapy    History of fractured pelvis    Hypertension    Hypothyroidism    Osteoporosis 12/2017   T score -2.8 stable from prior DEXA   Osteoporosis    Ovarian cancer ([place])    Pneumonia    several times   Past Surgical History:  Past Surgical History:  Procedure Laterality Date   ABDOMINAL HYSTERECTOMY  1996   ANKLE FRACTURE SURGERY     plate and screws   BIOPSY  02/05/2019   Procedure: BIOPSY;  Surgeon: Irving Copas., MD;  Location: [facility] ENDOSCOPY;  Service: Gastroenterology;;   BLADDER SUSPENSION     CATARACT EXTRACTION     COLONOSCOPY     ESOPHAGEAL BRUSHING  02/05/2019   Procedure: ESOPHAGEAL BRUSHING;  Surgeon: Irving Copas., MD;  Location: [facility] ENDOSCOPY;  Service: Gastroenterology;;   ESOPHAGOGASTRODUODENOSCOPY (EGD) WITH PROPOFOL N/A 02/05/2019   Procedure: ESOPHAGOGASTRODUODENOSCOPY (EGD) WITH PROPOFOL with dialtion;  Surgeon: Irving Copas., MD;  Location: [facility];  Service: Gastroenterology;  Laterality: N/A;   EYE SURGERY     cataracts removed-bilateral   hip surgey     knee surgey     LUNG SURGERY     OOPHORECTOMY     BSO   SAVORY DILATION N/A 02/05/2019   Procedure: SAVORY DILATION;  Surgeon: Irving Copas., MD;  Location: [facility];  Service: Gastroenterology;  Laterality: N/A;    TONSILLECTOMY     UPPER GI ENDOSCOPY     HPI:  Pt is an 86 y.o. female with PMHx of DM type 2, anxiety and depression, hypothyroidism, CHF, HTN, HLD, GERD, RA, chronic UTIs, and COPD/asthma on intermittent oxygen,86 yo female referred for MBS by Dr Cathie Olden, Cardiology.  Also h/o ovarian cancer s/p chemo, unintentional weight loss, anxiety, fibromyalgia and bronchiectasis.  Pt with is s/p endoscopy 01/2019 with findings of potential candida, erythematous mucosal in the atrum, dilation performed of the entire esophagus, mucosal rent noted at UES, 4 cm hiatal hernia.   Family reported to this SLP on 07/2021 (for OP MBS) that pt takes her medications with applesauce due to it being easier for her to swallow and coughs sometimes with intake. MBS 07/25/2021 normal swallow with solids, premature of liquids, silent min aspiration of thin with secretions. Recommended regular/thin diet with chin tuck as chin tuck prevented penetration of thin.  11/01/2018 esophagram dysmotility Nonspecific esophageal dysmotility disorder, with considerable tertiary contractions distally in the esophagus, small type 1 HH.Now the the patient admitted and diagnosed with influenza.  She has been seen by [facility] palliative. H/o right lobe atypical pna and prior concern for fecal impaction.  MD ordered swallow eval- Current CXR negative for acute finding and old rib fractures.    Assessment / Plan / Recommendation  Clinical Impression  Patient current presents with  suspected multifactorial dysphagia due to known esophageal dysmotility diagnosed from prior esophagram and oropharyngeal deficits from prior [facility] 12/2020.  MBS of 2022, pt aspirated thin with secretions due to impaired laryngeal closure.  Chin tuck posture effective to prevent aspiration and allow only trace penetration during prior MBS but pt reports it is not comfortable. Pt today consumed thin via straw WITH CHIN TUCK with thin without cough not effective manuever given her  discomfort.  Coughing noted with thin in head neutral position - suspect due to premature spilage of liquids into pharynx given her suspected oral holding.   Nectar thick liquids consumed via straw - without any indication of aspiration - and pt reports improved ease in swallowing today. Pt currently with worse delay in swallow *suspect oral holding up to 9 seconds* than during prior MBS testing - suspect cognitive based.  Vertical mastication pattern noted as well with pt taking very small bites.  Recommend pt continue diet trying softer foods and nectar thick liquids with meals to mitigate aspiration and maximize pt's comfort.  Given chronicity of dysphagia, she likely has episodic aspiration and overal manages when well.  No family present during session and caregiver went to obtain lunch during SLP session.  Will follow up for education with family next week, SLP has concern for adequacy of po given small boluses consumed during today's session.  Skilled intervention included determining optimal compensation strategies to maximzie comfort/airway protection with po.  Reveiwed prior MBS with pt.  Medications with applesauce advised as pt has consumed at least dating back to 2022. SLP Visit Diagnosis: Dysphagia, oropharyngeal phase (R13.12);Dysphagia, unspecified (R13.10)    Aspiration Risk  Moderate aspiration risk    Diet Recommendation Regular;Thin liquid;Nectar-thick liquid (order soft foods please)   Medication Administration: Whole meds with puree Supervision: Patient able to self feed Compensations: Slow rate;Small sips/bites Postural Changes: Seated upright at 90 degrees;Remain upright for at least 30 minutes after po intake    Other  Recommendations Oral Care Recommendations: Oral care BID Other Recommendations: Order thickener from pharmacy    Recommendations for follow up therapy are one component of a multi-disciplinary discharge planning process, led by the attending physician.   Recommendations may be updated based on patient status, additional functional criteria and insurance authorization.  Follow up Recommendations Follow physician's recommendations for discharge plan and follow up therapies      Assistance Recommended at Discharge  full  Functional Status Assessment Patient has had a recent decline in their functional status and/or demonstrates limited ability to make significant improvements in function in a reasonable and predictable amount of time  Frequency and Duration min 1 x/week  1 week       Prognosis Prognosis for Safe Diet Advancement: Fair Barriers to Reach Goals: Time post onset Barriers/Prognosis Comment: pt denies dysphagia      Swallow Study   General HPI: Pt is an 86 y.o. female with PMHx of DM type 2, anxiety and depression, hypothyroidism, CHF, HTN, HLD, GERD, RA, chronic UTIs, and COPD/asthma on intermittent oxygen,86 yo female referred for MBS by Dr Cathie Olden, Cardiology.  Also h/o ovarian cancer s/p chemo, unintentional weight loss, anxiety, fibromyalgia and bronchiectasis.  Pt with is s/p endoscopy 01/2019 with findings of potential candida, erythematous mucosal in the atrum, dilation performed of the entire esophagus, mucosal rent noted at UES, 4 cm hiatal hernia.   Family reported to this SLP on 07/2021 (for OP MBS) that pt takes her medications with applesauce due to it being easier for  her to swallow and coughs sometimes with intake. MBS 07/25/2021 normal swallow with solids, premature of liquids, silent min aspiration of thin with secretions. Recommended regular/thin diet with chin tuck as chin tuck prevented penetration of thin.  11/01/2018 esophagram dysmotility Nonspecific esophageal dysmotility disorder, with considerable tertiary contractions distally in the esophagus, small type 1 HH.Now the the patient admitted and diagnosed with influenza.  She has been seen by [facility] palliative. H/o right lobe atypical pna and prior concern for  fecal impaction.  MD ordered swallow eval- Current CXR negative for acute finding and old rib fractures. Type of Study: Bedside Swallow Evaluation Previous Swallow Assessment: mbs 12/2020 Diet Prior to this Study: Regular;Thin liquids Temperature Spikes Noted: No Respiratory Status: Nasal cannula History of Recent Intubation: No Behavior/Cognition: Alert;Cooperative;Pleasant mood (HOH) Oral Cavity Assessment: Within Functional Limits Oral Care Completed by SLP: No Oral Cavity - Dentition: Adequate natural dentition Vision: Functional for self-feeding Self-Feeding Abilities: Able to feed self Patient Positioning: Upright in chair Baseline Vocal Quality: Low vocal intensity Volitional Cough: Congested;Other (Comment) (productive x1) Volitional Swallow: Unable to elicit    Oral/Motor/Sensory Function Overall Oral Motor/Sensory Function: Mild impairment Facial ROM: Within Functional Limits Facial Symmetry: Abnormal symmetry left Facial Strength: Within Functional Limits Lingual ROM: Within Functional Limits Lingual Symmetry: Within Functional Limits Velum: Within Functional Limits Mandible: Within Functional Limits   Ice Chips Ice chips: Not tested   Thin Liquid Thin Liquid: Impaired Presentation: Self Fed;Straw Oral Phase Functional Implications: Oral holding Pharyngeal  Phase Impairments: Cough - Immediate    Nectar Thick Nectar Thick Liquid: Impaired Presentation: Straw;Self Fed Oral phase functional implications: Oral holding   Honey Thick Honey Thick Liquid: Not tested   Puree Puree: Impaired Presentation: Spoon Oral Phase Impairments: Reduced lingual movement/coordination Oral Phase Functional Implications: Prolonged oral transit Pharyngeal Phase Impairments: Cough - Delayed   Solid     Solid: Impaired Presentation: Self Fed Oral Phase Impairments: Reduced lingual movement/coordination;Impaired mastication Oral Phase Functional Implications: Impaired mastication;Other  (comment) Pharyngeal Phase Impairments: Suspected delayed Swallow Other Comments: vertical mastication pattern noted consistent with cognitive deficits      Macario Golds 02/19/2022,2:02 PM  Kathleen Lime, MS [facility] SLP Acute Rehab Services Office [phone] Pager [phone]

## 2022-02-19 NOTE — TOC Progression Note (Signed)
Transition of Care [facility]) - Progression Note    Patient Details  Name: Kayla Thompson MRN: [id number] Date of Birth: [birth year]  Transition of Care [facility]) CM/SW Contact  Leeroy Cha, RN Phone Number: 02/19/2022, 2:22 PM  Clinical Narrative:    Request for [facility] pt and rn sent to [facility].   Expected Discharge Plan: [place] Barriers to Discharge: Continued Medical Work up  Expected Discharge Plan and Services Expected Discharge Plan: [place]   Discharge Planning Services: CM Consult Post Acute Care Choice: [place] arrangements for the past 2 months: Single Family Home                           HH Arranged: RN, PT [facility] Agency: [address] ([facility]) Date [place]: 02/19/22 Time [place]: 1422 Representative spoke with at [place]: [place] ([place]) Interventions    Readmission Risk Interventions   Row Labels 07/27/2021    2:37 PM 04/13/2021    9:53 AM 01/12/2021    9:32 AM  Readmission Risk Prevention Plan   Section Header. No data exists in this row.     Transportation Screening   Complete Complete Complete  PCP or Specialist Appt within 3-5 Days   Complete  Complete  HRI or [place]   Complete  Complete  Social Work Consult for [place] Planning/Counseling   Complete    Palliative Care Screening   Not Applicable  Not Applicable  Medication Review [employer])   Complete Complete Complete  PCP or Specialist appointment within 3-5 days of discharge    Complete   HRI or [place]    Complete   SW Recovery Care/Counseling Consult    Complete   Palliative Care Screening    Not [place]    Not Applicable

## 2022-02-19 NOTE — Progress Notes (Signed)
SP BSE completed, full report to follow.  Pt known to SLP from prior MBS in Oct 2022 at which time she aspirated thin with secretions due to impaired laryngeal closure. Chin tuck posture effective to prevent aspiration and allow only trace penetration. Pt today consumed thin via straw with thin without coughing *as observed with head neutral* but she reports discomfort with this posture - thus it is not effective manuever. Nectar thick liquids consumed via straw - without any indication of aspiration - and pt reports improved ease in swallowing. Pt currently with more delay in swallow *suspect oral holding up to 9 seconds* than during prior testing - suspect cognitive based. Vertical mastication pattern noted as well - and pt takes very small bites. Recommend pt continue diet trying softer foods and nectar thick liquids with meals to decrease aspiration *not prevent. Will follow up for education with family next week - concern for adequacy of po given small boluses consumed during today's session. Medications with applesauce advised.   Kathleen Lime, MS [facility] SLP Acute Rehab Services Office [phone] Pager [phone]

## 2022-02-19 NOTE — Progress Notes (Signed)
[facility]                                                                               Kayla Thompson, is a 86 y.o. female, DOB - 1935-07-25, HYQ:[id number] Admit date - 02/17/2022    Outpatient Primary MD for the patient is [name], MD  LOS - 2  days    Brief summary   This is a  86 y.o. female with PMHx of DM type 2, anxiety and depression, hypothyroidism, CHF, HTN, HLD, GERD, RA, chronic UTIs, and COPD/asthma on intermittent oxygen. The patient was diagnosed with influenza. The hospitalist have been asked to admit   Assessment & Plan    Assessment and Plan:   Influenza infection:  Symptomatic management with tamiflu, nebs and fluids.  Sputum cultures pending.     AKI; Gently hydrate and repeat renal parameters improved.  Creatinine at baseline is less than 1.  Creatinine at 1.1 and improved to 1.08 to 1.03. baseline creatinine appears to be around 0.6.  Continue to monitor.   Chronic diastolic CHF:  She appears dehydrated.  No decompensation.   Hypothyroidism:  Resume home meds.    Hypertension:  Well controlled BP parameters.    Hyperlipidemia:  Resume statin.    COPD: no wheezing heard.     Anemia of chronic disease:  Hemoglobin stable at 9.    Hypokalemia: replaced.    Mild hyponatremia:  Continue to monitor.   Leukocytosis. Continue to monitor.    Daughter reports that she might have UTI at the facility.  Urine cultures sent and pending.  Therapy eval recommending HOME health PT.    Estimated body mass index is 18.05 kg/m as calculated from the following:   Height as of this encounter: '5\' 4"'$  (1.626 m).   Weight as of this encounter: 47.7 kg.  Code Status: DNR DVT Prophylaxis:  enoxaparin (LOVENOX) injection 30 mg Start: 02/18/22 1000 SCDs Start: 02/18/22 0007   Level of Care: Level of care: Telemetry Family Communication: caregiver at bedside.   Disposition Plan:     Remains inpatient  appropriate:  Iv antibiotics, IV fluids.   Procedures:  None.   Consultants:   None.   Antimicrobials:   Anti-infectives (From admission, onward)    Start     Dose/Rate Route Frequency Ordered Stop   02/18/22 1000  oseltamivir (TAMIFLU) capsule 30 mg        30 mg Oral Daily 02/18/22 0006 02/22/22 0959   02/18/22 1000  azithromycin (ZITHROMAX) 500 mg in sodium chloride 0.9 % 250 mL IVPB        500 mg 250 mL/hr over 60 Minutes Intravenous Every 24 hours 02/18/22 0816 02/21/22 0959   02/17/22 1615  oseltamivir (TAMIFLU) capsule 75 mg        75 mg Oral  Once 02/17/22 1604 02/17/22 1611        Medications  Scheduled Meds:  docusate sodium  100 mg Oral Daily   DULoxetine  90 mg Oral Daily   enoxaparin (LOVENOX) injection  30 mg Subcutaneous Q24H   feeding supplement  237 mL Oral BID BM   fluticasone furoate-vilanterol  1 puff Inhalation Daily  And   umeclidinium bromide  1 puff Inhalation Daily   insulin aspart  0-15 Units Subcutaneous TID WC   insulin aspart  0-5 Units Subcutaneous QHS   insulin glargine-yfgn  5 Units Subcutaneous Daily   levothyroxine  50 mcg Oral Q0600   oseltamivir  30 mg Oral Daily   pantoprazole  40 mg Oral Daily   polyethylene glycol  17 g Oral QODAY   predniSONE  1 mg Oral Q M,W,F   rosuvastatin  10 mg Oral QPM   senna  1 tablet Oral Daily   Continuous Infusions:  azithromycin 500 mg (02/19/22 1018)   lactated ringers 75 mL/hr at 02/18/22 1319   PRN Meds:.acetaminophen **OR** acetaminophen, ipratropium-albuterol, ondansetron **OR** ondansetron (ZOFRAN) IV, oxyCODONE, senna-docusate    Subjective:   Kayla Thompson was seen and examined today.  COUGH IS IMPROVING. No chest pain . Breathing has improved.   Objective:   Vitals:   02/18/22 2129 02/19/22 0639 02/19/22 0654 02/19/22 1155  BP: 126/61 129/66  119/65  Pulse: (!) 104 92  89  Resp: 18 20  (!) 22  Temp: 100.2 F (37.9 C) 98.2 F (36.8 C)  98 F (36.7 C)  TempSrc: Oral Oral   Oral  SpO2: 94% 95% 94% 94%  Weight:      Height:        Intake/Output Summary (Last 24 hours) at 02/19/2022 1227 Last data filed at 02/19/2022 1155 Gross per 24 hour  Intake 1408.09 ml  Output 1900 ml  Net -491.91 ml    Filed Weights   02/17/22 1417  Weight: 47.7 kg     Exam General exam: Appears calm and comfortable  Respiratory system: Clear to auscultation. Respiratory effort normal. Cardiovascular system: S1 & S2 heard, RRR. No JVD,. Gastrointestinal system: Abdomen is nondistended, soft and nontender. Central nervous system: Alert and oriented. No focal neurological deficits. Extremities: no pedal edema.  Skin: No rashes,  Psychiatry:  Mood & affect appropriate.     Data Reviewed:  I have personally reviewed following labs and imaging studies   CBC Lab Results  Component Value Date   WBC 12.3 (H) 02/19/2022   RBC 3.03 (L) 02/19/2022   HGB 9.3 (L) 02/19/2022   HCT 30.6 (L) 02/19/2022   MCV 101.0 (H) 02/19/2022   MCH 30.7 02/19/2022   PLT 185 02/19/2022   MCHC 30.4 02/19/2022   RDW 13.9 02/19/2022   LYMPHSABS 4.1 (H) 02/19/2022   MONOABS 1.7 (H) 02/19/2022   EOSABS 0.3 02/19/2022   BASOSABS 0.0 63/87/5643     Last metabolic panel Lab Results  Component Value Date   NA 133 (L) 02/19/2022   K 3.4 (L) 02/19/2022   CL 103 02/19/2022   CO2 21 (L) 02/19/2022   BUN 19 02/19/2022   CREATININE 1.03 (H) 02/19/2022   GLUCOSE 146 (H) 02/19/2022   GFRNONAA 54 (L) 02/19/2022   GFRAA 72 01/08/2020   CALCIUM 8.0 (L) 02/19/2022   PHOS 3.1 07/27/2021   PROT 6.0 (L) 07/28/2021   ALBUMIN 3.0 (L) 07/28/2021   BILITOT 0.6 07/28/2021   ALKPHOS 51 07/28/2021   AST 41 07/28/2021   ALT 54 (H) 07/28/2021   ANIONGAP 9 02/19/2022    CBG (last 3)  Recent Labs    02/18/22 2128 02/19/22 0739 02/19/22 1147  GLUCAP 197* 140* 209*       Coagulation Profile: No results for input(s): "INR", "PROTIME" in the last 168 hours.   Radiology Studies: DG Chest 2  View  Result  Date: 02/17/2022 CLINICAL DATA:  SOB EXAM: CHEST - 2 VIEW COMPARISON:  07/25/2021. FINDINGS: The heart size and mediastinal contours are within normal limits. Linear scarring or subsegmental atelectasis identified right base. No evidence of pulmonary edema or pneumonia. No pneumothorax or pleural effusion. There are thoracic degenerative changes. Old bilateral rib fractures are identified, a stable finding. Aorta appears ectatic and calcified. IMPRESSION: Right base subsegmental atelectasis. No acute cardiopulmonary process identified Electronically Signed   By: Sammie Bench M.D.   On: 02/17/2022 14:56       Hosie Poisson M.D. [facility] 02/19/2022, 12:27 PM  Available via Epic secure chat 7am-7pm After 7 pm, please refer to night coverage provider listed on amion.

## 2022-02-19 NOTE — Evaluation (Signed)
Physical Therapy Evaluation Patient Details Name: Kayla Thompson MRN: [id number] DOB: 1935-05-04 Today's Date: 02/19/2022  History of Present Illness  86 y.o. female admitted 02/17/22 for influenza.  PMHx of DM type 2, anxiety and depression, hypothyroidism, CHF, HTN, HLD, GERD, RA, chronic UTIs, and COPD/asthma on intermittent oxygen, fibromyalgia  Clinical Impression  Pt admitted with above diagnosis.  Pt currently with functional limitations due to the deficits listed below (see PT Problem List). Pt will benefit from skilled PT to increase their independence and safety with mobility to allow discharge to the venue listed below.  Pt required some encouragement to be OOB today.  Caregiver present and assist pt with motivation and positioning (pt has particular method of movement due to decreased bil shoulder movement and arthritis.  Pt overall mod assist today.  Caregiver reports plan is to return home, and pt typically has HHPT after a hospitalization.        Recommendations for follow up therapy are one component of a multi-disciplinary discharge planning process, led by the attending physician.  Recommendations may be updated based on patient status, additional functional criteria and insurance authorization.  Follow Up Recommendations Home health PT      Assistance Recommended at Discharge Frequent or constant Supervision/Assistance  Patient can return home with the following  A little help with walking and/or transfers;A little help with bathing/dressing/bathroom;Help with stairs or ramp for entrance    Equipment Recommendations None recommended by PT  Recommendations for Other Services       Functional Status Assessment Patient has had a recent decline in their functional status and demonstrates the ability to make significant improvements in function in a reasonable and predictable amount of time.     Precautions / Restrictions Precautions Precautions: Fall Precaution  Comments: unable to perform bil shoulder movement, requests her aide assist with hand positioning/placement Restrictions Weight Bearing Restrictions: No      Mobility  Bed Mobility Overal bed mobility: Needs Assistance Bed Mobility: Supine to Sit     Supine to sit: Mod assist     General bed mobility comments: requires assist for trunk at baseline due to poor use of UEs from lack of shoulder movement    Transfers Overall transfer level: Needs assistance Equipment used: Rolling walker (2 wheels) Transfers: Sit to/from Stand, Bed to chair/wheelchair/BSC Sit to Stand: Mod assist, +2 physical assistance   Step pivot transfers: Min assist       General transfer comment: assist to rise and control descent; remained on O2 [place]    Ambulation/Gait                  Stairs            Wheelchair Mobility    Modified Rankin (Stroke Patients Only)       Balance Overall balance assessment: Needs assistance         Standing balance support: Bilateral upper extremity supported, Reliant on assistive device for balance Standing balance-Leahy Scale: Poor                               Pertinent Vitals/Pain Pain Assessment Pain Assessment: Faces Faces Pain Scale: Hurts even more Pain Location: with assisted arm movement (chronic, due to RA and arthritis) Pain Descriptors / Indicators: Grimacing Pain Intervention(s): Repositioned, Monitored during session    Home Living Family/patient expects to be discharged to:: Private residence Living Arrangements: Children (daughter) Available Help at Discharge: Family;Personal  care attendant;Available 24 hours/day (Aide for housework M-Sat 8hours/day, some ADLs) Type of Home: House         Home Layout: One level Home Equipment: Conservation officer, nature (2 wheels)      Prior Function Prior Level of Function : Needs assist             Mobility Comments: Pt uses RW in house for short distance ADLs Comments:  patient was able to complete toileting tasks with caregivers present. patient does not complete bathing or dressing tasks herself. bilateral shoulder ROM issues impact participation in all tasks.     Hand Dominance        Extremity/Trunk Assessment   Upper Extremity Assessment Upper Extremity Assessment:  (shoulder severely limited)    Lower Extremity Assessment Lower Extremity Assessment: Generalized weakness       Communication   Communication: HOH  Cognition Arousal/Alertness: Awake/alert Behavior During Therapy: Flat affect Overall Cognitive Status: Within Functional Limits for tasks assessed                                 General Comments: requires encouragement        General Comments      Exercises     Assessment/Plan    PT Assessment Patient needs continued PT services  PT Problem List Decreased range of motion;Decreased mobility;Decreased strength;Decreased activity tolerance;Decreased balance       PT Treatment Interventions DME instruction;Gait training;Balance training;Therapeutic exercise;Functional mobility training;Therapeutic activities;Patient/family education    PT Goals (Current goals can be found in the Care Plan section)  Acute Rehab PT Goals PT Goal Formulation: With patient Time For Goal Achievement: 03/05/22 Potential to Achieve Goals: Good    Frequency Min 2X/week     Co-evaluation               AM-PAC PT "6 Clicks" Mobility  Outcome Measure Help needed turning from your back to your side while in a flat bed without using bedrails?: A Lot Help needed moving from lying on your back to sitting on the side of a flat bed without using bedrails?: A Lot Help needed moving to and from a bed to a chair (including a wheelchair)?: A Lot Help needed standing up from a chair using your arms (e.g., wheelchair or bedside chair)?: A Lot Help needed to walk in hospital room?: Total Help needed climbing 3-5 steps with a  railing? : Total 6 Click Score: 10    End of Session Equipment Utilized During Treatment: Oxygen Activity Tolerance: Patient tolerated treatment well Patient left: in chair;with call bell/phone within reach;with family/visitor present Nurse Communication: Mobility status PT Visit Diagnosis: Other abnormalities of gait and mobility (R26.89)    Time: 4287-6811 PT Time Calculation (min) (ACUTE ONLY): 18 min   Charges:   PT Evaluation $PT Eval Low Complexity: 1 Low         Kati PT, DPT Physical Therapist Acute Rehabilitation Services Preferred contact method: Secure Chat Weekend Pager Only: [phone] Office: [phone]   Myrtis Hopping [place] 02/19/2022, 11:48 AM

## 2022-02-20 DIAGNOSIS — E1165 Type 2 diabetes mellitus with hyperglycemia: Secondary | ICD-10-CM | POA: Diagnosis not present

## 2022-02-20 DIAGNOSIS — I5032 Chronic diastolic (congestive) heart failure: Secondary | ICD-10-CM | POA: Diagnosis not present

## 2022-02-20 DIAGNOSIS — J111 Influenza due to unidentified influenza virus with other respiratory manifestations: Secondary | ICD-10-CM | POA: Diagnosis not present

## 2022-02-20 DIAGNOSIS — N179 Acute kidney failure, unspecified: Secondary | ICD-10-CM | POA: Diagnosis not present

## 2022-02-20 LAB — BASIC METABOLIC PANEL
Anion gap: 7 (ref 5–15)
BUN: 12 mg/dL (ref 8–23)
CO2: 24 mmol/L (ref 22–32)
Calcium: 8.5 mg/dL — ABNORMAL LOW (ref 8.9–10.3)
Chloride: 107 mmol/L (ref 98–111)
Creatinine, Ser: 0.97 mg/dL (ref 0.44–1.00)
GFR, Estimated: 58 mL/min — ABNORMAL LOW (ref >60)
Glucose, Bld: 102 mg/dL — ABNORMAL HIGH (ref 70–99)
Potassium: 4.1 mmol/L (ref 3.5–5.1)
Sodium: 138 mmol/L (ref 135–145)

## 2022-02-20 LAB — GLUCOSE, CAPILLARY
Glucose-Capillary: 109 mg/dL — ABNORMAL HIGH (ref 70–99)
Glucose-Capillary: 105 mg/dL — ABNORMAL HIGH (ref 70–99)
Glucose-Capillary: 222 mg/dL — ABNORMAL HIGH (ref 70–99)
Glucose-Capillary: 88 mg/dL (ref 70–99)

## 2022-02-20 MED ORDER — SODIUM CHLORIDE 0.9 % IV SOLN
1.0000 g | INTRAVENOUS | Status: DC
  Administered 2022-02-20: 1 g via INTRAVENOUS
  Filled 2022-02-20: qty 10

## 2022-02-20 NOTE — Progress Notes (Signed)
   02/20/22 2232  Vitals  Temp 98.9 F (37.2 C)  Temp Source Oral  BP 112/66  MAP (mmHg) 80  BP Location Right Arm  BP Method Automatic  Patient Position (if appropriate) Sitting  Pulse Rate (!) 112  Pulse Rate Source Monitor  Resp 20  MEWS COLOR  MEWS Score Color Yellow  Oxygen Therapy  SpO2 90 %  O2 Device Room Air  Pain Assessment  Pain Scale 0-10  Pain Score 7  Pain Type Acute pain  Pain Location Shoulder  Pain Orientation Right  Pain Radiating Towards right hand  Pain Descriptors / Indicators Aching  Pain Frequency Constant  Pain Onset On-going  MEWS Score  MEWS Temp 0  MEWS Systolic 0  MEWS Pulse 2  MEWS RR 0  MEWS LOC 0  MEWS Score 2   Pt's O2 is 90%, apply 2 L oxygen through [place]. MD notified.

## 2022-02-20 NOTE — Progress Notes (Signed)
   02/20/22 2034  Assess: MEWS Score  Temp 98.9 F (37.2 C)  BP (!) 162/86  MAP (mmHg) 110  Pulse Rate (!) 109  Resp (!) 30  SpO2 94 %  O2 Device Room Air  Assess: MEWS Score  MEWS Temp 0  MEWS Systolic 0  MEWS Pulse 1  MEWS RR 2  MEWS LOC 0  MEWS Score 3  MEWS Score Color Yellow  Assess: if the MEWS score is Yellow or Red  Were vital signs taken at a resting state? Yes  Focused Assessment Change from prior assessment (see assessment flowsheet)  Does the patient meet 2 or more of the SIRS criteria? Yes  Does the patient have a confirmed or suspected source of infection? No  MEWS guidelines implemented *See Row Information* Yes  Treat  MEWS Interventions Administered prn meds/treatments;Escalated (See documentation below)  Pain Scale 0-10  Pain Score 7  Pain Type Acute pain  Pain Location Hand  Pain Orientation Right;Left  Pain Descriptors / Indicators Discomfort  Pain Frequency Constant  Pain Onset On-going  Patients Stated Pain Goal 0  Pain Intervention(s) Medication (See eMAR);Pain med given for lower pain score than stated, per patient request  Take Vital Signs  Increase Vital Sign Frequency  Yellow: Q 2hr X 2 then Q 4hr X 2, if remains yellow, continue Q 4hrs  Escalate  MEWS: Escalate Yellow: discuss with charge nurse/RN and consider discussing with provider and RRT  Notify: Charge Nurse/RN  Name of Charge Nurse/RN Notified Kristine  Date Charge Nurse/RN Notified 02/20/22  Time Charge Nurse/RN Notified 2055  Provider Notification  Provider Name/Title Raenette Rover  Date Provider Notified 02/20/22  Time Provider Notified 2058  Method of Notification Page  Notification Reason Change in status  Notify: Rapid Response  Name of Rapid Response RN Notified ICU Charge nurse  Date Rapid Response Notified 02/20/22  Time Rapid Response Notified 2101  Document  Progress note created (see row info) Yes  Assess: SIRS CRITERIA  SIRS Temperature  0  SIRS Pulse 1  SIRS  Respirations  1  SIRS WBC 1  SIRS Score Sum  3   Patient increased BP, HR, and RR, pain score is 7. Pain medication given at 2041. MD, CN RT notified. RN will recheck the VS and continue monitor pt.

## 2022-02-20 NOTE — Progress Notes (Signed)
[facility]                                                                               Kayla Thompson, is a 86 y.o. female, DOB - 05/01/1935, MLY:[id number] Admit date - 02/17/2022    Outpatient Primary MD for the patient is [name], MD  LOS - 3  days    Brief summary   This is a  86 y.o. female with PMHx of DM type 2, anxiety and depression, hypothyroidism, CHF, HTN, HLD, GERD, RA, chronic UTIs, and COPD/asthma on intermittent oxygen. The patient was diagnosed with influenza. The hospitalist have been asked to admit   Assessment & Plan    Assessment and Plan:   Influenza infection:  Symptomatic management with tamiflu, nebs and fluids.  Sputum cultures pending.  Completed 3 days of azithromycin for acute bronchitis    AKI; Gently hydrate and repeat renal parameters improved.  Creatinine at baseline is less than 1.  Creatinine back to baseline today.  Chronic diastolic CHF:  She appears dehydrated.  No decompensation.   Hypothyroidism:  Resume home meds.    Hypertension:  Blood pressure parameters are optimal   Hyperlipidemia:  Resume statin.    COPD: no wheezing heard.     Anemia of chronic disease:  Hemoglobin stable at 9.    Hypokalemia: replaced.    Mild hyponatremia:  Continue to monitor.   Leukocytosis. Continue to monitor.    Daughter reports that she might have UTI at the facility.  Urine cultures sent and pending.  Urine cultures are growing 30,000 gram-negative rods.  Will started on IV Rocephin. Therapy eval recommending HOME health PT.    Estimated body mass index is 18.05 kg/m as calculated from the following:   Height as of this encounter: '5\' 4"'$  (1.626 m).   Weight as of this encounter: 47.7 kg.  Code Status: DNR DVT Prophylaxis:  enoxaparin (LOVENOX) injection 30 mg Start: 02/18/22 1000 SCDs Start: 02/18/22 0007   Level of Care: Level of care: Telemetry Family Communication: caregiver at  bedside.   Disposition Plan:     Remains inpatient appropriate:  Iv antibiotics, IV fluids.   Procedures:  None.   Consultants:   None.   Antimicrobials:   Anti-infectives (From admission, onward)    Start     Dose/Rate Route Frequency Ordered Stop   02/20/22 1205  cefTRIAXone (ROCEPHIN) 1 g in sodium chloride 0.9 % 100 mL IVPB        1 g 200 mL/hr over 30 Minutes Intravenous Every 24 hours 02/20/22 1205     02/18/22 1000  oseltamivir (TAMIFLU) capsule 30 mg        30 mg Oral Daily 02/18/22 0006 02/22/22 0959   02/18/22 1000  azithromycin (ZITHROMAX) 500 mg in sodium chloride 0.9 % 250 mL IVPB        500 mg 250 mL/hr over 60 Minutes Intravenous Every 24 hours 02/18/22 0816 02/20/22 1409   02/17/22 1615  oseltamivir (TAMIFLU) capsule 75 mg        75 mg Oral  Once 02/17/22 1604 02/17/22 1611        Medications  Scheduled Meds:  docusate sodium  100 mg Oral Daily   DULoxetine  90 mg Oral Daily   enoxaparin (LOVENOX) injection  30 mg Subcutaneous Q24H   feeding supplement  237 mL Oral BID BM   fluticasone furoate-vilanterol  1 puff Inhalation Daily   And   umeclidinium bromide  1 puff Inhalation Daily   insulin aspart  0-15 Units Subcutaneous TID WC   insulin aspart  0-5 Units Subcutaneous QHS   insulin glargine-yfgn  5 Units Subcutaneous Daily   levothyroxine  50 mcg Oral Q0600   oseltamivir  30 mg Oral Daily   pantoprazole  40 mg Oral Daily   polyethylene glycol  17 g Oral QODAY   predniSONE  1 mg Oral Q M,W,F   rosuvastatin  10 mg Oral QPM   senna  1 tablet Oral Daily   Continuous Infusions:  cefTRIAXone (ROCEPHIN)  IV 1 g (02/20/22 1515)   lactated ringers 75 mL/hr at 02/19/22 1631   PRN Meds:.acetaminophen **OR** acetaminophen, food thickener, ipratropium-albuterol, ondansetron **OR** ondansetron (ZOFRAN) IV, oxyCODONE, senna-docusate    Subjective:   Kayla Thompson was seen and examined today.  Breathing and cough is getting better, no chest  pain  Objective:   Vitals:   02/19/22 1155 02/19/22 2043 02/20/22 0544 02/20/22 0621  BP: 119/65 118/65 (!) 147/68   Pulse: 89 85 77   Resp: (!) '22 19 16   '$ Temp: 98 F (36.7 C) 97.7 F (36.5 C) 98.2 F (36.8 C)   TempSrc: Oral Oral Oral   SpO2: 94% 96% 99% 97%  Weight:      Height:        Intake/Output Summary (Last 24 hours) at 02/20/2022 1613 Last data filed at 02/20/2022 9798 Gross per 24 hour  Intake 2017.37 ml  Output 1425 ml  Net 592.37 ml    Filed Weights   02/17/22 1417  Weight: 47.7 kg     Exam General exam: Frail elderly woman not in any kind of distress Respiratory system: Clear to auscultation. Respiratory effort normal.  On room air Cardiovascular system: S1 & S2 heard, RRR. No JVD,  No pedal edema. Gastrointestinal system: Abdomen is nondistended, soft and nontender.  Normal bowel sounds heard. Central nervous system: Alert and oriented to place and person only Extremities: Symmetric 5 x 5 power. Skin: No rashes,  Psychiatry: Mood & affect appropriate.    Data Reviewed:  I have personally reviewed following labs and imaging studies   CBC Lab Results  Component Value Date   WBC 12.3 (H) 02/19/2022   RBC 3.03 (L) 02/19/2022   HGB 9.3 (L) 02/19/2022   HCT 30.6 (L) 02/19/2022   MCV 101.0 (H) 02/19/2022   MCH 30.7 02/19/2022   PLT 185 02/19/2022   MCHC 30.4 02/19/2022   RDW 13.9 02/19/2022   LYMPHSABS 4.1 (H) 02/19/2022   MONOABS 1.7 (H) 02/19/2022   EOSABS 0.3 02/19/2022   BASOSABS 0.0 92/01/9416     Last metabolic panel Lab Results  Component Value Date   NA 138 02/20/2022   K 4.1 02/20/2022   CL 107 02/20/2022   CO2 24 02/20/2022   BUN 12 02/20/2022   CREATININE 0.97 02/20/2022   GLUCOSE 102 (H) 02/20/2022   GFRNONAA 58 (L) 02/20/2022   GFRAA 72 01/08/2020   CALCIUM 8.5 (L) 02/20/2022   PHOS 3.1 07/27/2021   PROT 6.0 (L) 07/28/2021   ALBUMIN 3.0 (L) 07/28/2021   BILITOT 0.6 07/28/2021   ALKPHOS 51 07/28/2021   AST 41  07/28/2021  ALT 54 (H) 07/28/2021   ANIONGAP 7 02/20/2022    CBG (last 3)  Recent Labs    02/19/22 2039 02/20/22 0804 02/20/22 1303  GLUCAP 177* 109* 105*       Coagulation Profile: No results for input(s): "INR", "PROTIME" in the last 168 hours.   Radiology Studies: No results found.     Hosie Poisson M.D. [facility] 02/20/2022, 4:13 PM  Available via Epic secure chat 7am-7pm After 7 pm, please refer to night coverage provider listed on amion.

## 2022-02-21 DIAGNOSIS — I5032 Chronic diastolic (congestive) heart failure: Secondary | ICD-10-CM | POA: Diagnosis not present

## 2022-02-21 DIAGNOSIS — N179 Acute kidney failure, unspecified: Secondary | ICD-10-CM | POA: Diagnosis not present

## 2022-02-21 DIAGNOSIS — J9611 Chronic respiratory failure with hypoxia: Secondary | ICD-10-CM | POA: Diagnosis not present

## 2022-02-21 DIAGNOSIS — J111 Influenza due to unidentified influenza virus with other respiratory manifestations: Secondary | ICD-10-CM | POA: Diagnosis not present

## 2022-02-21 LAB — CBC WITH DIFFERENTIAL/PLATELET
Abs Immature Granulocytes: 0 10*3/uL (ref 0.00–0.07)
Basophils Absolute: 0 10*3/uL (ref 0.0–0.1)
Basophils Relative: 0 %
Eosinophils Absolute: 0 10*3/uL (ref 0.0–0.5)
Eosinophils Relative: 0 %
HCT: 28.4 % — ABNORMAL LOW (ref 36.0–46.0)
Hemoglobin: 8.9 g/dL — ABNORMAL LOW (ref 12.0–15.0)
Lymphocytes Relative: 17 %
Lymphs Abs: 2.5 10*3/uL (ref 0.7–4.0)
MCH: 30.1 pg (ref 26.0–34.0)
MCHC: 31.3 g/dL (ref 30.0–36.0)
MCV: 95.9 fL (ref 80.0–100.0)
Monocytes Absolute: 0.6 10*3/uL (ref 0.1–1.0)
Monocytes Relative: 4 %
Neutro Abs: 11.7 10*3/uL — ABNORMAL HIGH (ref 1.7–7.7)
Neutrophils Relative %: 79 %
Platelets: 230 10*3/uL (ref 150–400)
RBC: 2.96 MIL/uL — ABNORMAL LOW (ref 3.87–5.11)
RDW: 13.7 % (ref 11.5–15.5)
WBC: 14.8 10*3/uL — ABNORMAL HIGH (ref 4.0–10.5)
nRBC: 0 % (ref 0.0–0.2)

## 2022-02-21 LAB — URINE CULTURE: Culture: 30000 — AB

## 2022-02-21 LAB — GLUCOSE, CAPILLARY
Glucose-Capillary: 94 mg/dL (ref 70–99)
Glucose-Capillary: 232 mg/dL — ABNORMAL HIGH (ref 70–99)
Glucose-Capillary: 113 mg/dL — ABNORMAL HIGH (ref 70–99)
Glucose-Capillary: 199 mg/dL — ABNORMAL HIGH (ref 70–99)

## 2022-02-21 MED ORDER — FOSFOMYCIN TROMETHAMINE 3 G PO PACK
3.0000 g | PACK | Freq: Once | ORAL | Status: AC
  Administered 2022-02-21: 3 g via ORAL
  Filled 2022-02-21: qty 3

## 2022-02-21 NOTE — Progress Notes (Signed)
[facility]                                                                               Kayla Thompson, is a 86 y.o. female, DOB - Nov 16, 1935, [name] Admit date - 02/17/2022    Outpatient Primary MD for the patient is [name], MD  LOS - 4  days    Brief summary   This is a  86 y.o. female with PMHx of DM type 2, anxiety and depression, hypothyroidism, CHF, HTN, HLD, GERD, RA, chronic UTIs, and COPD/asthma on intermittent oxygen. The patient was diagnosed with influenza. The hospitalist have been asked to admit   Assessment & Plan    Assessment and Plan:   Influenza infection:  Symptomatic management with tamiflu, nebs and fluids.  Sputum cultures pending.  Completed 3 days of azithromycin for acute bronchitis    AKI; Gently hydrate and repeat renal parameters improved.  Creatinine at baseline is less than 1.  Creatinine back to baseline today.  Chronic diastolic CHF:  She appears dehydrated.  No decompensation.   Hypothyroidism:  Resume home meds.    Hypertension:  Blood pressure parameters are optimal   Hyperlipidemia:  Resume statin.    COPD: no wheezing heard.     Anemia of chronic disease:  Hemoglobin stable at 9.    Hypokalemia: replaced.    Mild hyponatremia:  Continue to monitor.   Leukocytosis. Continue to monitor.    Daughter reports that she might have UTI at the facility.  Urine cultures sent and pending.  Urine cultures are growing 30,000 gram-negative rods.  Will started on IV Rocephin urine cultures show e coli resistant  to rocephin. Fosfomycin given.  Therapy eval recommending HOME health PT.    Estimated body mass index is 18.05 kg/m as calculated from the following:   Height as of this encounter: '5\' 4"'$  (1.626 m).   Weight as of this encounter: 47.7 kg.  Code Status: DNR DVT Prophylaxis:  enoxaparin (LOVENOX) injection 30 mg Start: 02/18/22 1000 SCDs Start: 02/18/22 0007   Level of  Care: Level of care: Med-Surg Family Communication: caregiver at bedside.   Disposition Plan:     Remains inpatient appropriate:  Iv antibiotics, IV fluids.   Procedures:  None.   Consultants:   None.   Antimicrobials:   Anti-infectives (From admission, onward)    Start     Dose/Rate Route Frequency Ordered Stop   02/21/22 1330  fosfomycin (MONUROL) packet 3 g        3 g Oral  Once 02/21/22 1234 02/21/22 1304   02/20/22 1205  cefTRIAXone (ROCEPHIN) 1 g in sodium chloride 0.9 % 100 mL IVPB  Status:  Discontinued        1 g 200 mL/hr over 30 Minutes Intravenous Every 24 hours 02/20/22 1205 02/21/22 1228   02/18/22 1000  oseltamivir (TAMIFLU) capsule 30 mg        30 mg Oral Daily 02/18/22 0006 02/21/22 1006   02/18/22 1000  azithromycin (ZITHROMAX) 500 mg in sodium chloride 0.9 % 250 mL IVPB        500 mg 250 mL/hr over 60 Minutes Intravenous Every 24 hours  02/18/22 0816 02/20/22 1409   02/17/22 1615  oseltamivir (TAMIFLU) capsule 75 mg        75 mg Oral  Once 02/17/22 1604 02/17/22 1611        Medications  Scheduled Meds:  docusate sodium  100 mg Oral Daily   DULoxetine  90 mg Oral Daily   enoxaparin (LOVENOX) injection  30 mg Subcutaneous Q24H   feeding supplement  237 mL Oral BID BM   fluticasone furoate-vilanterol  1 puff Inhalation Daily   And   umeclidinium bromide  1 puff Inhalation Daily   insulin aspart  0-15 Units Subcutaneous TID WC   insulin aspart  0-5 Units Subcutaneous QHS   insulin glargine-yfgn  5 Units Subcutaneous Daily   levothyroxine  50 mcg Oral Q0600   pantoprazole  40 mg Oral Daily   polyethylene glycol  17 g Oral QODAY   predniSONE  1 mg Oral Q M,W,F   rosuvastatin  10 mg Oral QPM   senna  1 tablet Oral Daily   Continuous Infusions:   PRN Meds:.acetaminophen **OR** acetaminophen, food thickener, ipratropium-albuterol, ondansetron **OR** ondansetron (ZOFRAN) IV, oxyCODONE, senna-docusate    Subjective:   Kayla Thompson was seen and  examined today.  No new complaints.   Objective:   Vitals:   02/21/22 0036 02/21/22 0447 02/21/22 0819 02/21/22 0936  BP: 105/64 105/60 112/71   Pulse: (!) 105 86 83   Resp: '20 18 16   '$ Temp: 99.4 F (37.4 C) 98.4 F (36.9 C) 100 F (37.8 C)   TempSrc: Oral Oral Oral   SpO2: 94% 92% 92% 92%  Weight:      Height:        Intake/Output Summary (Last 24 hours) at 02/21/2022 1830 Last data filed at 02/21/2022 1727 Gross per 24 hour  Intake 280 ml  Output 825 ml  Net -545 ml    Filed Weights   02/17/22 1417  Weight: 47.7 kg     Exam General exam: Appears calm and comfortable  Respiratory system: Clear to auscultation. Respiratory effort normal. Cardiovascular system: S1 & S2 heard, RRR. No JVD,  No pedal edema. Gastrointestinal system: Abdomen is nondistended, soft and nontender.  Central nervous system: Alert and oriented. No focal neurological deficits. Extremities: Symmetric 5 x 5 power. Skin: No rashes,  Psychiatry:  Mood & affect appropriate.     Data Reviewed:  I have personally reviewed following labs and imaging studies   CBC Lab Results  Component Value Date   WBC 14.8 (H) 02/21/2022   RBC 2.96 (L) 02/21/2022   HGB 8.9 (L) 02/21/2022   HCT 28.4 (L) 02/21/2022   MCV 95.9 02/21/2022   MCH 30.1 02/21/2022   PLT 230 02/21/2022   MCHC 31.3 02/21/2022   RDW 13.7 02/21/2022   LYMPHSABS 2.5 02/21/2022   MONOABS 0.6 02/21/2022   EOSABS 0.0 02/21/2022   BASOSABS 0.0 03/47/4259     Last metabolic panel Lab Results  Component Value Date   NA 138 02/20/2022   K 4.1 02/20/2022   CL 107 02/20/2022   CO2 24 02/20/2022   BUN 12 02/20/2022   CREATININE 0.97 02/20/2022   GLUCOSE 102 (H) 02/20/2022   GFRNONAA 58 (L) 02/20/2022   GFRAA 72 01/08/2020   CALCIUM 8.5 (L) 02/20/2022   PHOS 3.1 07/27/2021   PROT 6.0 (L) 07/28/2021   ALBUMIN 3.0 (L) 07/28/2021   BILITOT 0.6 07/28/2021   ALKPHOS 51 07/28/2021   AST 41 07/28/2021   ALT 54 (H) 07/28/2021  ANIONGAP 7 02/20/2022    CBG (last 3)  Recent Labs    02/21/22 0800 02/21/22 1131 02/21/22 1600  GLUCAP 94 232* 113*       Coagulation Profile: No results for input(s): "INR", "PROTIME" in the last 168 hours.   Radiology Studies: No results found.     Hosie Poisson M.D. [facility] 02/21/2022, 6:30 PM  Available via Epic secure chat 7am-7pm After 7 pm, please refer to night coverage provider listed on amion.

## 2022-02-22 ENCOUNTER — Inpatient Hospital Stay (HOSPITAL_COMMUNITY): Payer: Medicare Other

## 2022-02-22 DIAGNOSIS — I5032 Chronic diastolic (congestive) heart failure: Secondary | ICD-10-CM | POA: Diagnosis not present

## 2022-02-22 DIAGNOSIS — J9611 Chronic respiratory failure with hypoxia: Secondary | ICD-10-CM | POA: Diagnosis not present

## 2022-02-22 DIAGNOSIS — J111 Influenza due to unidentified influenza virus with other respiratory manifestations: Secondary | ICD-10-CM | POA: Diagnosis not present

## 2022-02-22 DIAGNOSIS — N179 Acute kidney failure, unspecified: Secondary | ICD-10-CM | POA: Diagnosis not present

## 2022-02-22 LAB — CBC WITH DIFFERENTIAL/PLATELET
Abs Immature Granulocytes: 0.11 10*3/uL — ABNORMAL HIGH (ref 0.00–0.07)
Basophils Absolute: 0 10*3/uL (ref 0.0–0.1)
Basophils Relative: 0 %
Eosinophils Absolute: 0.3 10*3/uL (ref 0.0–0.5)
Eosinophils Relative: 2 %
HCT: 29.4 % — ABNORMAL LOW (ref 36.0–46.0)
Hemoglobin: 9.3 g/dL — ABNORMAL LOW (ref 12.0–15.0)
Immature Granulocytes: 1 %
Lymphocytes Relative: 33 %
Lymphs Abs: 4.5 10*3/uL — ABNORMAL HIGH (ref 0.7–4.0)
MCH: 30.1 pg (ref 26.0–34.0)
MCHC: 31.6 g/dL (ref 30.0–36.0)
MCV: 95.1 fL (ref 80.0–100.0)
Monocytes Absolute: 1.8 10*3/uL — ABNORMAL HIGH (ref 0.1–1.0)
Monocytes Relative: 13 %
Neutro Abs: 7 10*3/uL (ref 1.7–7.7)
Neutrophils Relative %: 51 %
Platelets: 280 10*3/uL (ref 150–400)
RBC: 3.09 MIL/uL — ABNORMAL LOW (ref 3.87–5.11)
RDW: 13.5 % (ref 11.5–15.5)
WBC: 13.7 10*3/uL — ABNORMAL HIGH (ref 4.0–10.5)
nRBC: 0 % (ref 0.0–0.2)

## 2022-02-22 LAB — BASIC METABOLIC PANEL
Anion gap: 9 (ref 5–15)
BUN: 10 mg/dL (ref 8–23)
CO2: 25 mmol/L (ref 22–32)
Calcium: 8.4 mg/dL — ABNORMAL LOW (ref 8.9–10.3)
Chloride: 101 mmol/L (ref 98–111)
Creatinine, Ser: 1.07 mg/dL — ABNORMAL HIGH (ref 0.44–1.00)
GFR, Estimated: 51 mL/min — ABNORMAL LOW (ref >60)
Glucose, Bld: 120 mg/dL — ABNORMAL HIGH (ref 70–99)
Potassium: 3.7 mmol/L (ref 3.5–5.1)
Sodium: 135 mmol/L (ref 135–145)

## 2022-02-22 LAB — CULTURE, RESPIRATORY W GRAM STAIN

## 2022-02-22 LAB — GLUCOSE, CAPILLARY
Glucose-Capillary: 118 mg/dL — ABNORMAL HIGH (ref 70–99)
Glucose-Capillary: 249 mg/dL — ABNORMAL HIGH (ref 70–99)
Glucose-Capillary: 206 mg/dL — ABNORMAL HIGH (ref 70–99)

## 2022-02-22 MED ORDER — ZINC OXIDE 40 % EX OINT
TOPICAL_OINTMENT | Freq: Four times a day (QID) | CUTANEOUS | Status: DC
  Administered 2022-02-23: 1 via TOPICAL
  Filled 2022-02-22: qty 57

## 2022-02-22 MED ORDER — VANCOMYCIN HCL 500 MG/100ML IV SOLN: 500.0000 mg | INTRAVENOUS | Status: DC

## 2022-02-22 MED ORDER — CIPROFLOXACIN HCL 250 MG PO TABS: 250.0000 mg | ORAL_TABLET | Freq: Two times a day (BID) | ORAL | Status: DC

## 2022-02-22 MED ORDER — VANCOMYCIN HCL IN DEXTROSE 1-5 GM/200ML-% IV SOLN
1000.0000 mg | Freq: Once | INTRAVENOUS | Status: AC
  Administered 2022-02-22: 1000 mg via INTRAVENOUS
  Filled 2022-02-22: qty 200

## 2022-02-22 MED ORDER — CIPROFLOXACIN HCL 500 MG PO TABS
500.0000 mg | ORAL_TABLET | ORAL | Status: DC
  Administered 2022-02-22: 500 mg via ORAL
  Filled 2022-02-22: qty 1

## 2022-02-22 NOTE — Progress Notes (Signed)
Pharmacy Antibiotic Note  Kayla Thompson is a 86 y.o. female admitted on 02/17/2022 with influenza infection.  Pharmacy has been consulted for vancomycin dosing for MRSA in sputum..  Plan: Change cipro 250 mg po bid to cipro 500 mg po q24 Vancomycin 1000 mg IV x 1 then vancomycin 500 mg IV q24 for est ACU 504.4 and Css min 14.8 using TBW 47.7 kg & Vd 0.72 F/u renal function, WBC, temp, culture data  Height: '5\' 4"'$  (162.6 cm) Weight: 47.7 kg (105 lb 2.6 oz) IBW/kg (Calculated) : 54.7  Temp (24hrs), Avg:98.6 F (37 C), Min:98.4 F (36.9 C), Max:98.8 F (37.1 C)  Recent Labs  Lab 02/17/22 1423 02/18/22 0407 02/19/22 0409 02/20/22 0408 02/21/22 0334 02/22/22 0916  WBC 9.5 7.7 12.3*  --  14.8* 13.7*  CREATININE 1.14* 1.08* 1.03* 0.97  --  1.07*    Estimated Creatinine Clearance: 29.5 mL/min (A) (by C-G formula based on SCr of 1.07 mg/dL (H)).    Allergies  Allergen Reactions   Tape Other (See Comments)    SKIN IS VERY THIN AND DELICATE- will tear like paper!!   Lasix [Furosemide] Other (See Comments)    Confusion- MD stopped this med   Latex Rash   Penicillins Rash    Has patient had a PCN reaction causing immediate rash, facial/tongue/throat swelling, SOB or lightheadedness with hypotension: No Has patient had a PCN reaction causing severe rash involving mucus membranes or skin necrosis: No Has patient had a PCN reaction that required hospitalization: No Has patient had a PCN reaction occurring within the last 10 years: No If all of the above answers are "NO", then may proceed with Cephalosporin use.     Antimicrobials this admission:  11/30 azithromycin >> 12/2  11/30 Tamiflu >> 12/3 12/3 fosfomycin x 1  12/4 vanc>> 12/4 cipro>> Dose adjustments this admission:  12/4 cipro 250 bid > 500 q24 for CrCl 29.5 ml/min Microbiology results:  11/29 rep panel: influenza positive  11/30 MRSA PCR: negative  12/1 UCx: 30k ESBL E.coli F 12/1 Resp culture: few MRSA; few PsA  sens cipro & gent  Thank you for allowing pharmacy to be a part of this patient's care.  Eudelia Bunch, Pharm.D Use secure chat for questions 02/22/2022 6:21 PM

## 2022-02-22 NOTE — Progress Notes (Signed)
[facility]                                                                               Kayla Thompson, is a 86 y.o. female, DOB - [birth year], QAS:[id number] Admit date - 02/17/2022    Outpatient Primary MD for the patient is [name], MD  LOS - 5  days    Brief summary   This is a  86 y.o. female with PMHx of DM type 2, anxiety and depression, hypothyroidism, CHF, HTN, HLD, GERD, RA, chronic UTIs, and COPD/asthma on intermittent oxygen. The patient was diagnosed with influenza. The hospitalist have been asked to admit   Assessment & Plan    Assessment and Plan:   Influenza infection:  Symptomatic management with tamiflu, nebs and fluids.  Sputum cultures growing MRSA and pseudomonas, suspect colonization. She is still coughing. But weaned off oxygen.  Repeat CXR ordered. She has persistent leukocytosis and yesterday had low grade temp of 100F Will start her on IV vancomycin , probably transition to oral linezolid on discharge.  Will also cover pseudomonas with ciprofloxacin.  Will request ID for further recommendations.      AKI; Gently hydrated and repeat renal parameters improved.  Creatinine back to baseline today.  Chronic diastolic CHF:  She appears dehydrated.  No decompensation.   Hypothyroidism:  Resume home meds.    Hypertension:  Blood pressure parameters are optimal   Hyperlipidemia:  Resume statin.    COPD: no wheezing heard.     Anemia of chronic disease:  Hemoglobin stable at 9.    Hypokalemia: replaced.    Mild hyponatremia:  Continue to monitor.   Leukocytosis. Continue to monitor.    Daughter reports that she might have UTI at the facility.  Urine cultures sent and pending.  Urine cultures are growing 30,000 gram-negative rods.  Will started on IV Rocephin urine cultures show e coli resistant  to rocephin. Fosfomycin given.  Therapy eval recommending HOME health PT.    Estimated body mass index  is 18.05 kg/m as calculated from the following:   Height as of this encounter: '5\' 4"'$  (1.626 m).   Weight as of this encounter: 47.7 kg.  Code Status: DNR DVT Prophylaxis:  enoxaparin (LOVENOX) injection 30 mg Start: 02/18/22 1000 SCDs Start: 02/18/22 0007   Level of Care: Level of care: Med-Surg Family Communication: caregiver at bedside.   Disposition Plan:     Remains inpatient appropriate:  Iv antibiotics, IV fluids.   Procedures:  None.   Consultants:   None.   Antimicrobials:   Anti-infectives (From admission, onward)    Start     Dose/Rate Route Frequency Ordered Stop   02/21/22 1330  fosfomycin (MONUROL) packet 3 g        3 g Oral  Once 02/21/22 1234 02/21/22 1304   02/20/22 1205  cefTRIAXone (ROCEPHIN) 1 g in sodium chloride 0.9 % 100 mL IVPB  Status:  Discontinued        1 g 200 mL/hr over 30 Minutes Intravenous Every 24 hours 02/20/22 1205 02/21/22 1228   02/18/22 1000  oseltamivir (TAMIFLU) capsule 30 mg        30  mg Oral Daily 02/18/22 0006 02/21/22 1006   02/18/22 1000  azithromycin (ZITHROMAX) 500 mg in sodium chloride 0.9 % 250 mL IVPB        500 mg 250 mL/hr over 60 Minutes Intravenous Every 24 hours 02/18/22 0816 02/20/22 1409   02/17/22 1615  oseltamivir (TAMIFLU) capsule 75 mg        75 mg Oral  Once 02/17/22 1604 02/17/22 1611        Medications  Scheduled Meds:  docusate sodium  100 mg Oral Daily   DULoxetine  90 mg Oral Daily   enoxaparin (LOVENOX) injection  30 mg Subcutaneous Q24H   feeding supplement  237 mL Oral BID BM   fluticasone furoate-vilanterol  1 puff Inhalation Daily   And   umeclidinium bromide  1 puff Inhalation Daily   insulin aspart  0-15 Units Subcutaneous TID WC   insulin aspart  0-5 Units Subcutaneous QHS   insulin glargine-yfgn  5 Units Subcutaneous Daily   levothyroxine  50 mcg Oral Q0600   liver oil-zinc oxide   Topical QID   pantoprazole  40 mg Oral Daily   polyethylene glycol  17 g Oral QODAY   predniSONE  1 mg  Oral Q M,W,F   rosuvastatin  10 mg Oral QPM   senna  1 tablet Oral Daily   Continuous Infusions:   PRN Meds:.acetaminophen **OR** acetaminophen, food thickener, ipratropium-albuterol, ondansetron **OR** ondansetron (ZOFRAN) IV, oxyCODONE, senna-docusate    Subjective:   Kayla Thompson was seen and examined today.  Appears comfortable, cough has improved.   Objective:   Vitals:   02/21/22 0936 02/21/22 2112 02/22/22 0524 02/22/22 0758  BP:  127/65 126/70   Pulse:  86 81   Resp:  16 20   Temp:  98.4 F (36.9 C) 98.8 F (37.1 C)   TempSrc:  Oral Oral   SpO2: 92% 93% 93% 93%  Weight:      Height:        Intake/Output Summary (Last 24 hours) at 02/22/2022 1209 Last data filed at 02/21/2022 1727 Gross per 24 hour  Intake --  Output 500 ml  Net -500 ml    Filed Weights   02/17/22 1417  Weight: 47.7 kg     Exam General exam: Appears calm and comfortable  Respiratory system: Clear to auscultation. Respiratory effort normal. Cardiovascular system: S1 & S2 heard, RRR. No JVD, No pedal edema. Gastrointestinal system: Abdomen is nondistended, soft and nontender.  Central nervous system: Alert and oriented. No focal neurological deficits. Extremities: Symmetric 5 x 5 power. Skin: No rashes, lesions or ulcers Psychiatry:  Mood & affect appropriate.      Data Reviewed:  I have personally reviewed following labs and imaging studies   CBC Lab Results  Component Value Date   WBC 13.7 (H) 02/22/2022   RBC 3.09 (L) 02/22/2022   HGB 9.3 (L) 02/22/2022   HCT 29.4 (L) 02/22/2022   MCV 95.1 02/22/2022   MCH 30.1 02/22/2022   PLT 280 02/22/2022   MCHC 31.6 02/22/2022   RDW 13.5 02/22/2022   LYMPHSABS 4.5 (H) 02/22/2022   MONOABS 1.8 (H) 02/22/2022   EOSABS 0.3 02/22/2022   BASOSABS 0.0 67/89/3810     Last metabolic panel Lab Results  Component Value Date   NA 135 02/22/2022   K 3.7 02/22/2022   CL 101 02/22/2022   CO2 25 02/22/2022   BUN 10 02/22/2022    CREATININE 1.07 (H) 02/22/2022   GLUCOSE 120 (H) 02/22/2022  GFRNONAA 51 (L) 02/22/2022   GFRAA 72 01/08/2020   CALCIUM 8.4 (L) 02/22/2022   PHOS 3.1 07/27/2021   PROT 6.0 (L) 07/28/2021   ALBUMIN 3.0 (L) 07/28/2021   BILITOT 0.6 07/28/2021   ALKPHOS 51 07/28/2021   AST 41 07/28/2021   ALT 54 (H) 07/28/2021   ANIONGAP 9 02/22/2022    CBG (last 3)  Recent Labs    02/21/22 1600 02/21/22 2108 02/22/22 0802  GLUCAP 113* 199* 118*       Coagulation Profile: No results for input(s): "INR", "PROTIME" in the last 168 hours.   Radiology Studies: No results found.     Hosie Poisson M.D. [facility] 02/22/2022, 12:09 PM  Available via Epic secure chat 7am-7pm After 7 pm, please refer to night coverage provider listed on amion.

## 2022-02-22 NOTE — Progress Notes (Signed)
Physical Therapy Treatment Patient Details Name: Kayla Thompson MRN: [id number] DOB: [birth year] Today's Date: 02/22/2022   History of Present Illness 86 y.o. female admitted 02/17/22 for influenza.  PMHx of DM type 2, anxiety and depression, hypothyroidism, CHF, HTN, HLD, GERD, RA, chronic UTIs, and COPD/asthma on intermittent oxygen, fibromyalgia    PT Comments    General Comments: AxO x 1 following repeat commands and quick to say "No".  Personal Care sitter present during session. Pt required MAX encouragement to participate.  Assisted OOB was difficult.  General bed mobility comments: requires assist for trunk at baseline due to poor use of UEs from lack of shoulder movement.  utilized bed pad to complete scooting to EOB as pt offered < 10% assist.  General transfer comment: pt required increased assist this session from elevated bed to [facility] then to amb a few feet to recliner.  Pt unable to use B UE's present with VERY limited shoulder mvmt.  General Gait Details: VERY limited amb distance from [facility] to recliner at [facility] Assist to guide pt as well as walker to complete turns.  Poor kyphotic posture.  Poor balance with poor self coorection.  HIGH FALL RISK. Prior to admit, pt has Personal Aides M-Sat 8 hours.  Due to amount of assist needed currently, pt will need 24/7 assist at home.  Will reach out to [facility] and update LPT, pt NOT progressing enough to safely D/C back home with only 8 hrs Aide unless a family member can be with pt esp at night.   If 24/7 not available at home then SNF  Recommendations for follow up therapy are one component of a multi-disciplinary discharge planning process, led by the attending physician.  Recommendations may be updated based on patient status, additional functional criteria and insurance authorization.  Follow Up Recommendations  Home health PT (resume  8 hr personal Aides care M-Sat) + 24/7 care family support If NOT will need SNF     Assistance Recommended at  Discharge Frequent or constant Supervision/Assistance  Patient can return home with the following A little help with walking and/or transfers;A little help with bathing/dressing/bathroom;Help with stairs or ramp for entrance   Equipment Recommendations  None recommended by PT    Recommendations for Other Services       Precautions / Restrictions Precautions Precautions: Fall Precaution Comments: Severe RA, unable to perform bil shoulder movement, requests her aide assist with hand positioning/placement Restrictions Weight Bearing Restrictions: No     Mobility  Bed Mobility Overal bed mobility: Needs Assistance Bed Mobility: Supine to Sit     Supine to sit: Max assist     General bed mobility comments: requires assist for trunk at baseline due to poor use of UEs from lack of shoulder movement.  utilized bed pad to complete scooting to EOB as pt offered < 10% assist.    Transfers Overall transfer level: Needs assistance Equipment used: Rolling walker (2 wheels) Transfers: Sit to/from Stand, Bed to chair/wheelchair/BSC Sit to Stand: +2 physical assistance, +2 safety/equipment, Max assist   Step pivot transfers: Max assist       General transfer comment: pt required increased assist this session from elevated bed to [facility] then to amb a few feet to recliner.  Pt unable to use B UE's present with VERY limited shoulder mvmt.    Ambulation/Gait Ambulation/Gait assistance: Mod assist Gait Distance (Feet): 4 Feet Assistive device: Rolling walker (2 wheels) Gait Pattern/deviations: Step-to pattern, Decreased step length - right, Decreased step length -  left Gait velocity: decreased     General Gait Details: VERY limited amb distance from [facility] to recliner at [facility] Assist to guide pt as well as walker to complete turns.  Poor kyphotic posture.  Poor balance with poor self coorection.  HIGH FALL RISK.   Stairs             Wheelchair Mobility    Modified Rankin (Stroke  Patients Only)       Balance                                            Cognition Arousal/Alertness: Awake/alert Behavior During Therapy: Flat affect                                   General Comments: AxO x 1 following repeat commands and quick to say "No".  Personal Care sitter present during session.        Exercises      General Comments        Pertinent Vitals/Pain Pain Assessment Pain Assessment: Faces Faces Pain Scale: Hurts little more Pain Location: shoulders, back, neck "where doesn't it hurt?" stated pt. Pain Descriptors / Indicators: Grimacing, Aching Pain Intervention(s): Monitored during session    Home Living                          Prior Function            PT Goals (current goals can now be found in the care plan section) Progress towards PT goals: Progressing toward goals    Frequency    Min 2X/week      PT Plan Current plan remains appropriate    Co-evaluation              AM-PAC PT "6 Clicks" Mobility   Outcome Measure  Help needed turning from your back to your side while in a flat bed without using bedrails?: A Lot Help needed moving from lying on your back to sitting on the side of a flat bed without using bedrails?: A Lot Help needed moving to and from a bed to a chair (including a wheelchair)?: A Lot Help needed standing up from a chair using your arms (e.g., wheelchair or bedside chair)?: A Lot Help needed to walk in hospital room?: A Lot Help needed climbing 3-5 steps with a railing? : Total 6 Click Score: 11    End of Session Equipment Utilized During Treatment: Gait belt   Patient left: in chair;with call bell/phone within reach;with family/visitor present Nurse Communication: Mobility status PT Visit Diagnosis: Other abnormalities of gait and mobility (R26.89)     Time: 8841-6606 PT Time Calculation (min) (ACUTE ONLY): 26 min  Charges:  $Gait Training: 8-22  mins $Therapeutic Activity: 8-22 mins                     Rica Koyanagi  PTA [facility] M-F          [phone] Weekend pager [phone]

## 2022-02-23 DIAGNOSIS — J4530 Mild persistent asthma, uncomplicated: Secondary | ICD-10-CM | POA: Diagnosis not present

## 2022-02-23 DIAGNOSIS — M353 Polymyalgia rheumatica: Secondary | ICD-10-CM

## 2022-02-23 DIAGNOSIS — N179 Acute kidney failure, unspecified: Secondary | ICD-10-CM | POA: Diagnosis not present

## 2022-02-23 DIAGNOSIS — J101 Influenza due to other identified influenza virus with other respiratory manifestations: Secondary | ICD-10-CM | POA: Diagnosis not present

## 2022-02-23 DIAGNOSIS — Z515 Encounter for palliative care: Secondary | ICD-10-CM

## 2022-02-23 DIAGNOSIS — R413 Other amnesia: Secondary | ICD-10-CM

## 2022-02-23 DIAGNOSIS — E1165 Type 2 diabetes mellitus with hyperglycemia: Secondary | ICD-10-CM | POA: Diagnosis not present

## 2022-02-23 DIAGNOSIS — E039 Hypothyroidism, unspecified: Secondary | ICD-10-CM | POA: Diagnosis not present

## 2022-02-23 DIAGNOSIS — E43 Unspecified severe protein-calorie malnutrition: Secondary | ICD-10-CM

## 2022-02-23 DIAGNOSIS — Z7189 Other specified counseling: Secondary | ICD-10-CM

## 2022-02-23 DIAGNOSIS — J111 Influenza due to unidentified influenza virus with other respiratory manifestations: Secondary | ICD-10-CM | POA: Diagnosis not present

## 2022-02-23 LAB — GLUCOSE, CAPILLARY
Glucose-Capillary: 284 mg/dL — ABNORMAL HIGH (ref 70–99)
Glucose-Capillary: 260 mg/dL — ABNORMAL HIGH (ref 70–99)
Glucose-Capillary: 151 mg/dL — ABNORMAL HIGH (ref 70–99)
Glucose-Capillary: 197 mg/dL — ABNORMAL HIGH (ref 70–99)
Glucose-Capillary: 137 mg/dL — ABNORMAL HIGH (ref 70–99)
Glucose-Capillary: 129 mg/dL — ABNORMAL HIGH (ref 70–99)

## 2022-02-23 MED ORDER — ZINC OXIDE 40 % EX OINT: TOPICAL_OINTMENT | Freq: Four times a day (QID) | CUTANEOUS | 0 refills | Status: DC

## 2022-02-23 MED ORDER — FOOD THICKENER (SIMPLYTHICK): 1.0000 | ORAL | Status: DC | PRN

## 2022-02-23 MED ORDER — LINEZOLID 600 MG PO TABS
600.0000 mg | ORAL_TABLET | Freq: Two times a day (BID) | ORAL | Status: DC
  Administered 2022-02-23: 600 mg via ORAL
  Filled 2022-02-23: qty 1

## 2022-02-23 NOTE — Consult Note (Signed)
[facility]  Total days of antibiotics 6       Reason for Consult:secondary bacterial pneumonia post flu   Referring Physician: Karleen [place]  Principal Problem:   Influenza Active Problems:   DM2 (diabetes mellitus, type 2) ([place])   HLD (hyperlipidemia)   Hypothyroidism   Fibromyalgia   Hyperparathyroidism ([place])   Asthma, chronic   PMR (polymyalgia rheumatica) (HCC)   Chronic heart failure with preserved ejection fraction (HCC)   Memory changes   Severe protein-calorie malnutrition (HCC)   Dysphagia   COPD (chronic obstructive pulmonary disease) (HCC)   Chronic respiratory failure with hypoxia (HCC)   AKI (acute kidney injury) ([place])    HPI: Kayla Thompson is a 86 y.o. female with hx of T2DM, CHF, HTN, COPD on intermittent supplemental oxygen, did receive her  flu vaccine, admitted on 11/29 for worsening productive cough which has been waxing and waning but now has some confusion. She was found to have tachypnea, fever up to 102.56F, and mild tachycardia. She was found to have influenza. She was started on oseltamivir and nebulizers. She also did have sputum cultures that found rare MRSA and PsA on culture. CXR did not show any infiltrates. She is improve with no further need of oxygen. She was empirically started on azithromycin initially which would not have treated either pathogen. She did receive one dose of vanco and cipro yesterday. Patient reports her appetite is improving  Past Medical History:  Diagnosis Date   Anxiety    Arthritis    Asthma    Cataracts, bilateral    removed by surgery   COPD (chronic obstructive pulmonary disease) ([place])    Depression    Diabetes mellitus without complication ([place])    type 2   Dyspnea    with exertion   Fibromyalgia    GERD (gastroesophageal reflux disease)    History of chemotherapy    History of fractured pelvis    Hypertension    Hypothyroidism    Osteoporosis 12/2017   T score -2.8 stable from prior  DEXA   Osteoporosis    Ovarian cancer ([place])    Pneumonia    several times    Allergies:  Allergies  Allergen Reactions   Tape Other (See Comments)    SKIN IS VERY THIN AND DELICATE- will tear like paper!!   Lasix [Furosemide] Other (See Comments)    Confusion- MD stopped this med   Latex Rash   Penicillins Rash    Has patient had a PCN reaction causing immediate rash, facial/tongue/throat swelling, SOB or lightheadedness with hypotension: No Has patient had a PCN reaction causing severe rash involving mucus membranes or skin necrosis: No Has patient had a PCN reaction that required hospitalization: No Has patient had a PCN reaction occurring within the last 10 years: No If all of the above answers are "NO", then may proceed with Cephalosporin use.     Current antibiotics:   MEDICATIONS:  docusate sodium  100 mg Oral Daily   enoxaparin (LOVENOX) injection  30 mg Subcutaneous Q24H   feeding supplement  237 mL Oral BID BM   fluticasone furoate-vilanterol  1 puff Inhalation Daily   And   umeclidinium bromide  1 puff Inhalation Daily   insulin aspart  0-15 Units Subcutaneous TID WC   insulin aspart  0-5 Units Subcutaneous QHS   insulin glargine-yfgn  5 Units Subcutaneous Daily   levothyroxine  50 mcg Oral Q0600   liver oil-zinc oxide  Topical QID   pantoprazole  40 mg Oral Daily   polyethylene glycol  17 g Oral QODAY   predniSONE  1 mg Oral Q M,W,F   rosuvastatin  10 mg Oral QPM   senna  1 tablet Oral Daily    Social History   Tobacco Use   Smoking status: Never    Passive exposure: Never   Smokeless tobacco: Never  Vaping Use   Vaping Use: Never used  Substance Use Topics   Alcohol use: No    Alcohol/week: 0.0 standard drinks of alcohol   Drug use: No    Family History  Problem Relation Age of Onset   Lung cancer Mother    CAD Father    Diabetes Father    Lung cancer Maternal Aunt    Diabetes Paternal Aunt    Diabetes Paternal Uncle    Diabetes  Paternal Grandmother    Diabetes Paternal Grandfather    Colon cancer Neg Hx    Esophageal cancer Neg Hx    Inflammatory bowel disease Neg Hx    Liver disease Neg Hx    Pancreatic cancer Neg Hx    Rectal cancer Neg Hx    Stomach cancer Neg Hx     Review of Systems -  Review of Systems  Constitutional: Negative for fever, chills, diaphoresis, activity change, appetite change, fatigue and unexpected weight change.  HENT: Negative for congestion, sore throat, rhinorrhea, sneezing, trouble swallowing and sinus pressure.  Eyes: Negative for photophobia and visual disturbance.  Respiratory: Negative for cough, chest tightness, shortness of breath, wheezing and stridor.  Cardiovascular: Negative for chest pain, palpitations and leg swelling.  Gastrointestinal: Negative for nausea, vomiting, abdominal pain, diarrhea, constipation, blood in stool, abdominal distention and anal bleeding.  Genitourinary: Negative for dysuria, hematuria, flank pain and difficulty urinating.  Musculoskeletal: Negative for myalgias, back pain, joint swelling, arthralgias and gait problem.  Skin: Negative for color change, pallor, rash and wound.  Neurological: Negative for dizziness, tremors, weakness and light-headedness.  Hematological: Negative for adenopathy. Does not bruise/bleed easily.  Psychiatric/Behavioral: Negative for behavioral problems, confusion, sleep disturbance, dysphoric mood, decreased concentration and agitation.     OBJECTIVE: Temp:  [98.2 F (36.8 C)-99 F (37.2 C)] 98.2 F (36.8 C) (12/05 0443) Pulse Rate:  [80-86] 86 (12/05 0443) BP: (131-153)/(70-76) 131/76 (12/05 0443) SpO2:  [94 %-95 %] 94 % (12/05 0810) Physical Exam  Constitutional:  oriented to person, place, and time. appears frail and undernourished. No distress.  HENT: /AT, PERRLA, no scleral icterus Mouth/Throat: Oropharynx is clear and moist. No oropharyngeal exudate.  Cardiovascular: Normal rate, regular rhythm and  normal heart sounds. Exam reveals no gallop and no friction rub.  No murmur heard.  Pulmonary/Chest: Effort normal and breath sounds normal. No respiratory distress.  has no wheezes.  Neck = supple, no nuchal rigidity Abdominal: Soft. Bowel sounds are normal.  exhibits no distension. There is no tenderness.  Lymphadenopathy: no cervical adenopathy. No axillary adenopathy Neurological: alert and oriented to person, place, and time.  Skin: Skin is warm and dry. No rash noted. No erythema.  Psychiatric: a normal mood and affect.  behavior is normal.    LABS: Results for orders placed or performed during the hospital encounter of 02/17/22 (from the past 48 hour(s))  Glucose, capillary     Status: Abnormal   Collection Time: 02/21/22  4:00 PM  Result Value Ref Range   Glucose-Capillary 113 (H) 70 - 99 mg/dL    Comment: Glucose reference range applies  only to samples taken after fasting for at least 8 hours.  Glucose, capillary     Status: Abnormal   Collection Time: 02/21/22  9:08 PM  Result Value Ref Range   Glucose-Capillary 199 (H) 70 - 99 mg/dL    Comment: Glucose reference range applies only to samples taken after fasting for at least 8 hours.  Glucose, capillary     Status: Abnormal   Collection Time: 02/22/22  8:02 AM  Result Value Ref Range   Glucose-Capillary 118 (H) 70 - 99 mg/dL    Comment: Glucose reference range applies only to samples taken after fasting for at least 8 hours.  CBC with Differential/Platelet     Status: Abnormal   Collection Time: 02/22/22  9:16 AM  Result Value Ref Range   WBC 13.7 (H) 4.0 - 10.5 K/uL   RBC 3.09 (L) 3.87 - 5.11 MIL/uL   Hemoglobin 9.3 (L) 12.0 - 15.0 g/dL   HCT 29.4 (L) 36.0 - 46.0 %   MCV 95.1 80.0 - 100.0 fL   MCH 30.1 26.0 - 34.0 pg   MCHC 31.6 30.0 - 36.0 g/dL   RDW 13.5 11.5 - 15.5 %   Platelets 280 150 - 400 K/uL   nRBC 0.0 0.0 - 0.2 %   Neutrophils Relative % 51 %   Neutro Abs 7.0 1.7 - 7.7 K/uL   Lymphocytes Relative 33 %    Lymphs Abs 4.5 (H) 0.7 - 4.0 K/uL   Monocytes Relative 13 %   Monocytes Absolute 1.8 (H) 0.1 - 1.0 K/uL   Eosinophils Relative 2 %   Eosinophils Absolute 0.3 0.0 - 0.5 K/uL   Basophils Relative 0 %   Basophils Absolute 0.0 0.0 - 0.1 K/uL   Immature Granulocytes 1 %   Abs Immature Granulocytes 0.11 (H) 0.00 - 0.07 K/uL   Reactive, Benign Lymphocytes PRESENT     Comment: Performed at [facility], [place] [address], [place], [place] [address]  Basic metabolic panel     Status: Abnormal   Collection Time: 02/22/22  9:16 AM  Result Value Ref Range   Sodium 135 135 - 145 mmol/L   Potassium 3.7 3.5 - 5.1 mmol/L   Chloride 101 98 - 111 mmol/L   CO2 25 22 - 32 mmol/L   Glucose, Bld 120 (H) 70 - 99 mg/dL    Comment: Glucose reference range applies only to samples taken after fasting for at least 8 hours.   BUN 10 8 - 23 mg/dL   Creatinine, Ser 1.07 (H) 0.44 - 1.00 mg/dL   Calcium 8.4 (L) 8.9 - 10.3 mg/dL   GFR, Estimated 51 (L) >60 mL/min    Comment: (NOTE) Calculated using the CKD-EPI Creatinine Equation (2021)    Anion gap 9 5 - 15    Comment: Performed at [facility], [place] [address], [place], [place] [address]  Glucose, capillary     Status: Abnormal   Collection Time: 02/22/22 12:09 PM  Result Value Ref Range   Glucose-Capillary 249 (H) 70 - 99 mg/dL    Comment: Glucose reference range applies only to samples taken after fasting for at least 8 hours.  Glucose, capillary     Status: Abnormal   Collection Time: 02/22/22  5:45 PM  Result Value Ref Range   Glucose-Capillary 284 (H) 70 - 99 mg/dL    Comment: Glucose reference range applies only to samples taken after fasting for at least 8 hours.  Glucose, capillary  Status: Abnormal   Collection Time: 02/22/22  7:13 PM  Result Value Ref Range   Glucose-Capillary 260 (H) 70 - 99 mg/dL    Comment: Glucose reference range applies only to samples taken after fasting for at least 8 hours.    Comment 1 QC Due   Glucose, capillary     Status: Abnormal   Collection Time: 02/22/22  8:51 PM  Result Value Ref Range   Glucose-Capillary 206 (H) 70 - 99 mg/dL    Comment: Glucose reference range applies only to samples taken after fasting for at least 8 hours.  Glucose, capillary     Status: Abnormal   Collection Time: 02/23/22  7:33 AM  Result Value Ref Range   Glucose-Capillary 151 (H) 70 - 99 mg/dL    Comment: Glucose reference range applies only to samples taken after fasting for at least 8 hours.  Glucose, capillary     Status: Abnormal   Collection Time: 02/23/22 11:44 AM  Result Value Ref Range   Glucose-Capillary 197 (H) 70 - 99 mg/dL    Comment: Glucose reference range applies only to samples taken after fasting for at least 8 hours.    MICRO: reviewed IMAGING: DG Chest 2 View  Result Date: 02/22/2022 CLINICAL DATA:  Leukocytosis. EXAM: CHEST - 2 VIEW COMPARISON:  Chest radiograph dated 02/17/2022. FINDINGS: Mild diffuse chronic interstitial coarsening. Right lung base linear atelectasis/scarring. No focal consolidation, pleural effusion, or pneumothorax. The cardiac silhouette is within normal limits. Atherosclerotic calcification of the aorta. Osteopenia with degenerative changes of the spine and shoulders. No acute osseous pathology. Old right posterior rib fracture. IMPRESSION: No active cardiopulmonary disease. Electronically Signed   By: Anner Crete M.D.   On: 02/22/2022 21:08    HISTORICAL MICRO/IMAGING  Assessment/Plan:  86yo F with influenza but also found to be colonized with mrsa and psa on sputum cx - symptoms improved cxr clear  - no need to treat with further antibiotics - continue with supportive care  I have personally spent 65 minutes involved in face-to-face and non-face-to-face activities for this patient on the day of the visit. Professional time spent includes the following activities: Preparing to see the patient (review of tests), Obtaining  and/or reviewing separately obtained history (admission/discharge record), Performing a medically appropriate examination and/or evaluation , Ordering medications/tests/procedures, referring and communicating with other health care professionals, Documenting clinical information in the EMR, Independently interpreting results (not separately reported), Communicating results to the patient/family/caregiver, Counseling and educating the patient/family/caregiver and Care coordination (not separately reported).

## 2022-02-23 NOTE — Progress Notes (Signed)
Nutrition Follow-up  DOCUMENTATION CODES:   Severe malnutrition in context of chronic illness  INTERVENTION:  - DYS 3 Diet with Nectar Thickened liquids, per SLP recommendations.  - Increase Ensure Enlive po to TID to further support intake given poor oral intake and malnourished state. Each supplement provides 350 kcal and 20 grams of protein. - Encourage intake at all meals and of supplements. - Monitor weight trends closely.   NUTRITION DIAGNOSIS:   Severe Malnutrition related to chronic illness (CHF, COPD) as evidenced by severe fat depletion, severe muscle depletion. *ongoing  GOAL:   Patient will meet greater than or equal to 90% of their needs *not being met  MONITOR:   PO intake, Supplement acceptance, Weight trends  REASON FOR ASSESSMENT:   Malnutrition Screening Tool    ASSESSMENT:   86 y.o. female with PMHx of DM type 2, anxiety and depression, hypothyroidism, CHF, HTN, HLD, GERD, RA, chronic UTIs, and COPD/asthma on intermittent oxygen. The patient lives with her daughter who recently had a viral illness.  Subsequent to that the patient developed a cough productive of greenish sputum, decreased appetite and p.o. intake and was admitted for weakness, found to have influenza.  Patient alert at time of visit. Reported she had been eating well with 3 meals a day but then stated her appetite has been very poor.  Only two meals documented over the past 5 days - 50% of Dinner on 11/30 and 0% of Dinner on 12/2. Suspect that patient has been eating poorly despite her report.   Thankfully, patient stated she "loves" the Ensure and has been drinking it twice a day when it is given. Per meds history patient confirmed to be consuming her Ensure daily.  Encouraged patient to eat three meals a day to avoid weight loss/promote weight gain during admission. Discussed increasing Ensure to 3x/day to further support intake and patient seemed excited by this.   Patient discussed with  SLP. SLP evacuated patient today and downgraded to DYS 3 diet due to poor dentition and nectar thickened liquids as patient reported they were easier for her to swallow.   Give patient's poor intake and malnourished state, would consider palliative care consult for discussion of goals of care.   Medications reviewed and include: Colace, Insulin, Synthroid, Miralax, Senokot  Labs reviewed:  None today *Yesterday: Creatinine 1.07 Blood Glucose 118-260 x24 hours   Diet Order:   Diet Order             Diet regular Room service appropriate? Yes; Fluid consistency: Thin  Diet effective now                   EDUCATION NEEDS:  No education needs have been identified at this time  Skin:  Skin Assessment: Skin Integrity Issues: Skin Integrity Issues:: Other (Comment) Other: Wound on Coccyx  Last BM:  12/3  Height:  Ht Readings from Last 1 Encounters:  02/17/22 _0  (1.626 m)   Weight:  Wt Readings from Last 1 Encounters:  02/17/22 47.7 kg   BMI:  Body mass index is 18.05 kg/m.  Estimated Nutritional Needs:  Kcal:  1650-1800 kcals Protein:  70-95 grams Fluid:  >/= 1.6L    Samson Frederic RD, LDN For contact information, refer to [facility] ([place]).

## 2022-02-23 NOTE — Discharge Summary (Signed)
Physician Discharge Summary   Patient: Kayla Thompson MRN: [id number] DOB: 05/01/1935  Admit date:     02/17/2022  Discharge date: 02/24/22  Discharge Physician: Hosie Poisson   PCP: [name], MD   Recommendations at discharge:  Please follow up with PCP in one week.  Please follow up with palliative care as outpatient.    Discharge Diagnoses: Principal Problem:   Influenza Active Problems:   DM2 (diabetes mellitus, type 2) (HCC)   HLD (hyperlipidemia)   Hypothyroidism   Fibromyalgia   Hyperparathyroidism (HCC)   Asthma, chronic   PMR (polymyalgia rheumatica) (HCC)   Chronic heart failure with preserved ejection fraction (HCC)   Memory changes   Severe protein-calorie malnutrition (HCC)   Dysphagia   COPD (chronic obstructive pulmonary disease) (HCC)   Chronic respiratory failure with hypoxia (HCC)   AKI (acute kidney injury) [facility])    Hospital Course:  This is a  86 y.o. female with PMHx of DM type 2, anxiety and depression, hypothyroidism, CHF, HTN, HLD, GERD, RA, chronic UTIs, and COPD/asthma on intermittent oxygen. The patient was diagnosed with influenza, reports sob and cough.  She was admitted for further evaluation.    Assessment and Plan:   Influenza infection:  Symptomatic management with tamiflu, nebs and fluids.  Sputum cultures growing MRSA and pseudomonas, suspect colonization. She is still coughing. But weaned off oxygen.  Repeat CXR ordered. Its clear. She is much better and on RA.  ID consulted and recommended no indication for antibiotics.         AKI; Gently hydrated and repeat renal parameters improved.  Creatinine back to baseline.   Chronic diastolic CHF:  She appears dehydrated.  No decompensation.     Hypothyroidism:  Resume home meds.      Hypertension:  Blood pressure parameters are optimal     Hyperlipidemia:  Resume statin.      COPD: no wheezing heard.        Anemia of chronic disease:  Hemoglobin  stable at 9.      Hypokalemia: replaced.      Mild hyponatremia:  Continue to monitor.    Leukocytosis. Continue to monitor.  Check cbc in am.      Daughter reports that she might have UTI at the facility.  Urine cultures sent and pending.  Urine cultures are growing 30,000 gram-negative rods.  was started on IV Rocephin , urine cultures show e coli resistant  to rocephin. Fosfomycin given.  Therapy eval recommending HOME health PT.   In view of her age and multiple medical issues and poor progression, palliative care consulted for goals of care. She will need an outpatient palliative care follow up .    She is stable for discharge, but daughter is in [place] and unable to arrange for [facility] aids until tomorrow, so plan for discharge in am.      Estimated body mass index is 18.05 kg/m as calculated from the following:   Height as of this encounter: _0  (1.626 m).   Weight as of this encounter: 47.7 kg.      Consultants: palliative care  Procedures performed: none.   Disposition: Home Diet recommendation:  Regular diet DISCHARGE MEDICATION: Allergies as of 02/23/2022       Reactions   Tape Other (See Comments)   SKIN IS VERY THIN AND DELICATE- will tear like paper!!   Lasix [furosemide] Other (See Comments)   Confusion- MD stopped this med   Latex Rash   Penicillins  Rash   Has patient had a PCN reaction causing immediate rash, facial/tongue/throat swelling, SOB or lightheadedness with hypotension: No Has patient had a PCN reaction causing severe rash involving mucus membranes or skin necrosis: No Has patient had a PCN reaction that required hospitalization: No Has patient had a PCN reaction occurring within the last 10 years: No If all of the above answers are "NO", then may proceed with Cephalosporin use.        Medication List     STOP taking these medications    azithromycin 250 MG tablet Commonly known as: ZITHROMAX       TAKE these medications     acidophilus Caps capsule Take 1 capsule by mouth daily.   BD Pen Needle Nano 2nd Gen 32G X 4 MM Misc Generic drug: Insulin Pen Needle USE AS DIRECTED 4 TIMES A DAY   denosumab 60 MG/ML Sosy injection Commonly known as: PROLIA Inject 60 mg into the skin every 6 (six) months.   docusate sodium 100 MG capsule Commonly known as: COLACE Take 100 mg by mouth daily.   DULoxetine 30 MG capsule Commonly known as: CYMBALTA TAKE 1 CAPSULE BY MOUTH EVERY DAY What changed:  how much to take additional instructions   DULoxetine 60 MG capsule Commonly known as: CYMBALTA Take 60 mg by mouth daily. Taking with 30 mg = 90 mg What changed: Another medication with the same name was changed. Make sure you understand how and when to take each.   estradiol 0.1 MG/GM vaginal cream Commonly known as: ESTRACE Place 1 Applicatorful vaginally See admin instructions. Takes 2 times a week   feeding supplement Liqd Take 237 mLs by mouth 2 (two) times daily between meals. What changed: when to take this   food thickener Gel Commonly known as: SIMPLYTHICK (NECTAR/LEVEL 2/MILDLY THICK) Take 1 packet by mouth as needed.   freestyle lancets Use to test blood sugar 3 times daily. Dx: E11.65   FREESTYLE LITE test strip Generic drug: glucose blood USE TO TEST BLOOD SUGAR 3 TIMES DAILY   insulin glargine 100 UNIT/ML Solostar Pen Commonly known as: LANTUS Inject 5 Units into the skin in the morning.   ipratropium-albuterol 0.5-2.5 (3) MG/3ML Soln Commonly known as: DUONEB Inhale 3 mLs into the lungs every 4 (four) hours as needed. What changed: reasons to take this   levothyroxine 50 MCG tablet Commonly known as: SYNTHROID TAKE 1 TABLET BY MOUTH EVERY DAY BEFORE BREAKFAST What changed: See the new instructions.   liver oil-zinc oxide 40 % ointment Commonly known as: DESITIN Apply topically 4 (four) times daily.   methenamine 1 g tablet Commonly known as: MANDELAMINE Take 1,000 mg by  mouth 2 (two) times daily.   montelukast 10 MG tablet Commonly known as: SINGULAIR Take 10 mg by mouth at bedtime.   Mucinex DM Maximum Strength 60-1200 MG Tb12 Take 1 tablet by mouth in the morning and at bedtime.   naloxone 4 MG/0.1ML Liqd nasal spray kit Commonly known as: NARCAN Place 1 spray into the nose once as needed (overdose).   nitrofurantoin 50 MG capsule Commonly known as: MACRODANTIN Take 50 mg by mouth at bedtime.   omeprazole 20 MG capsule Commonly known as: PRILOSEC TAKE 1 CAPSULE BY MOUTH TWICE A DAY BEFORE A MEAL What changed: See the new instructions.   oxyCODONE-acetaminophen 10-325 MG tablet Commonly known as: PERCOCET Take 1 tablet by mouth every 6 (six) hours as needed for pain.   polyethylene glycol 17 g packet Commonly known  as: MIRALAX / GLYCOLAX Take 17 g by mouth 2 (two) times daily. Decrease to daily or every other day pending on bowel movement frequency/consistency What changed:  when to take this additional instructions   predniSONE 1 MG tablet Commonly known as: DELTASONE Take 1 tablet (1 mg total) by mouth in the morning and at bedtime. What changed:  when to take this additional instructions   rosuvastatin 10 MG tablet Commonly known as: CRESTOR TAKE 1 TABLET BY MOUTH EVERY DAY IN THE EVENING What changed:  how much to take how to take this   senna 8.6 MG tablet Commonly known as: SENOKOT Take 1 tablet by mouth daily.   Systane Complete 0.6 % Soln Generic drug: Propylene Glycol Place 1 drop into both eyes daily as needed (dry eyes).   Trelegy Ellipta 100-62.5-25 MCG/ACT Aepb Generic drug: Fluticasone-Umeclidin-Vilant INHALE 1 PUFF INTO THE LUNGS DAILY. RINSE MOUTH What changed: See the new instructions.   triamcinolone cream 0.1 % Commonly known as: KENALOG Apply 1 Application topically 2 (two) times daily as needed (itching).   TURMERIC PO Take 1,500 mg by mouth in the morning.   vitamin C 1000 MG tablet Take  1,000 mg by mouth in the morning and at bedtime.        Discharge Exam: Filed Weights   02/17/22 1417  Weight: 47.7 kg   General exam: Appears calm and comfortable  Respiratory system: Clear to auscultation. Respiratory effort normal. Cardiovascular system: S1 & S2 heard, RRR. No JVD,  Gastrointestinal system: Abdomen is nondistended, soft and nontender.  Central nervous system: Alert and oriented. No focal neurological deficits. Extremities: Symmetric 5 x 5 power. Skin: No rashes, lesions or ulcers Psychiatry:  Mood & affect appropriate.    Condition at discharge: fair  The results of significant diagnostics from this hospitalization (including imaging, microbiology, ancillary and laboratory) are listed below for reference.   Imaging Studies: DG Chest 2 View  Result Date: 02/22/2022 CLINICAL DATA:  Leukocytosis. EXAM: CHEST - 2 VIEW COMPARISON:  Chest radiograph dated 02/17/2022. FINDINGS: Mild diffuse chronic interstitial coarsening. Right lung base linear atelectasis/scarring. No focal consolidation, pleural effusion, or pneumothorax. The cardiac silhouette is within normal limits. Atherosclerotic calcification of the aorta. Osteopenia with degenerative changes of the spine and shoulders. No acute osseous pathology. Old right posterior rib fracture. IMPRESSION: No active cardiopulmonary disease. Electronically Signed   By: Anner Crete M.D.   On: 02/22/2022 21:08   DG Chest 2 View  Result Date: 02/17/2022 CLINICAL DATA:  SOB EXAM: CHEST - 2 VIEW COMPARISON:  07/25/2021. FINDINGS: The heart size and mediastinal contours are within normal limits. Linear scarring or subsegmental atelectasis identified right base. No evidence of pulmonary edema or pneumonia. No pneumothorax or pleural effusion. There are thoracic degenerative changes. Old bilateral rib fractures are identified, a stable finding. Aorta appears ectatic and calcified. IMPRESSION: Right base subsegmental atelectasis.  No acute cardiopulmonary process identified Electronically Signed   By: Sammie Bench M.D.   On: 02/17/2022 14:56    Microbiology: Results for orders placed or performed during the hospital encounter of 02/17/22  Resp Panel by RT-PCR (Flu A&B, Covid) Anterior Nasal Swab     Status: Abnormal   Collection Time: 02/17/22  2:23 PM   Specimen: Anterior Nasal Swab  Result Value Ref Range Status   SARS Coronavirus 2 by RT PCR NEGATIVE NEGATIVE Final    Comment: (NOTE) SARS-CoV-2 target nucleic acids are NOT DETECTED.  The SARS-CoV-2 RNA is generally detectable in upper respiratory  specimens during the acute phase of infection. The lowest concentration of SARS-CoV-2 viral copies this assay can detect is 138 copies/mL. A negative result does not preclude SARS-Cov-2 infection and should not be used as the sole basis for treatment or other patient management decisions. A negative result may occur with  improper specimen collection/handling, submission of specimen other than nasopharyngeal swab, presence of viral mutation(s) within the areas targeted by this assay, and inadequate number of viral copies(<138 copies/mL). A negative result must be combined with clinical observations, patient history, and epidemiological information. The expected result is Negative.  Fact Sheet for Patients:  EntrepreneurPulse.com.au  Fact Sheet for Healthcare Providers:  IncredibleEmployment.be  This test is no t yet approved or cleared by the [place] FDA and  has been authorized for detection and/or diagnosis of SARS-CoV-2 by FDA under an Emergency Use Authorization (EUA). This EUA will remain  in effect (meaning this test can be used) for the duration of the COVID-19 declaration under Section 564(b)(1) of the Act, 21 U.S.C.section 360bbb-3(b)(1), unless the authorization is terminated  or revoked sooner.       Influenza A by PCR POSITIVE (A) NEGATIVE Final    Influenza B by PCR NEGATIVE NEGATIVE Final    Comment: (NOTE) The Xpert Xpress SARS-CoV-2/FLU/RSV plus assay is intended as an aid in the diagnosis of influenza from Nasopharyngeal swab specimens and should not be used as a sole basis for treatment. Nasal washings and aspirates are unacceptable for Xpert Xpress SARS-CoV-2/FLU/RSV testing.  Fact Sheet for Patients: EntrepreneurPulse.com.au  Fact Sheet for Healthcare Providers: IncredibleEmployment.be  This test is not yet approved or cleared by the [place] FDA and has been authorized for detection and/or diagnosis of SARS-CoV-2 by FDA under an Emergency Use Authorization (EUA). This EUA will remain in effect (meaning this test can be used) for the duration of the COVID-19 declaration under Section 564(b)(1) of the Act, 21 U.S.C. section 360bbb-3(b)(1), unless the authorization is terminated or revoked.  Performed at [facility], [address] [place], [place] [address]   MRSA Next Gen by PCR, Nasal     Status: None   Collection Time: 02/18/22  5:41 AM   Specimen: Nasal Mucosa; Nasal Swab  Result Value Ref Range Status   MRSA by PCR Next Gen NOT DETECTED NOT DETECTED Final    Comment: (NOTE) The GeneXpert MRSA Assay (FDA approved for NASAL specimens only), is one component of a comprehensive MRSA colonization surveillance program. It is not intended to diagnose MRSA infection nor to guide or monitor treatment for MRSA infections. Test performance is not FDA approved in patients less than 59 years old. Performed at [facility], [place] [address] [place], [place] [address]   Expectorated Sputum Assessment w Gram Stain, Rflx to Resp Cult     Status: None   Collection Time: 02/19/22 10:09 AM   Specimen: Expectorated Sputum  Result Value Ref Range Status   Specimen Description EXPECTORATED SPUTUM  Final   Special Requests NONE  Final   Sputum  evaluation   Final    THIS SPECIMEN IS ACCEPTABLE FOR SPUTUM CULTURE Performed at [facility], [place] [address], [place], [place] [address]    Report Status 02/19/2022 FINAL  Final  Culture, Respiratory w Gram Stain     Status: None   Collection Time: 02/19/22 10:09 AM  Result Value Ref Range Status   Specimen Description   Final    EXPECTORATED SPUTUM Performed at [facility],  [place] [address], [place], [place] [address]    Special Requests   Final    NONE Reflexed from [id number] Performed at [facility], [place] [address], [place], [place] [address]    Gram Stain   Final    ABUNDANT SQUAMOUS EPITHELIAL CELLS PRESENT ABUNDANT GRAM POSITIVE COCCI MODERATE GRAM NEGATIVE RODS FEW GRAM POSITIVE RODS FEW WBC PRESENT, PREDOMINANTLY PMN Performed at [facility], [place] [address], [place], [place] [address]    Culture   Final    FEW METHICILLIN RESISTANT STAPHYLOCOCCUS AUREUS RARE PSEUDOMONAS AERUGINOSA    Report Status 02/22/2022 FINAL  Final   Organism ID, Bacteria METHICILLIN RESISTANT STAPHYLOCOCCUS AUREUS  Final   Organism ID, Bacteria PSEUDOMONAS AERUGINOSA  Final      Susceptibility   Methicillin resistant staphylococcus aureus - MIC*    CIPROFLOXACIN >=8 RESISTANT Resistant     ERYTHROMYCIN >=8 RESISTANT Resistant     GENTAMICIN 8 INTERMEDIATE Intermediate     OXACILLIN >=4 RESISTANT Resistant     TETRACYCLINE >=16 RESISTANT Resistant     VANCOMYCIN <=0.5 SENSITIVE Sensitive     TRIMETH/SULFA >=320 RESISTANT Resistant     CLINDAMYCIN >=8 RESISTANT Resistant     RIFAMPIN <=0.5 SENSITIVE Sensitive     Inducible Clindamycin NEGATIVE Sensitive     * FEW METHICILLIN RESISTANT STAPHYLOCOCCUS AUREUS   Pseudomonas aeruginosa - MIC*    CEFTAZIDIME 16 INTERMEDIATE Intermediate     CIPROFLOXACIN <=0.25 SENSITIVE Sensitive     GENTAMICIN <=1 SENSITIVE Sensitive     IMIPENEM >=16 RESISTANT Resistant     * RARE PSEUDOMONAS  AERUGINOSA  Urine Culture     Status: Abnormal   Collection Time: 02/19/22 10:10 AM   Specimen: Urine, Clean Catch  Result Value Ref Range Status   Specimen Description   Final    URINE, CLEAN CATCH Performed at [facility], [place] [address] [place], [place] [address]    Special Requests   Final    NONE Performed at [facility], [place] [address], [place], [place] [address]    Culture (A)  Final    30,000 COLONIES/mL ESCHERICHIA COLI Confirmed Extended Spectrum Beta-Lactamase Producer (ESBL).  In bloodstream infections from ESBL organisms, carbapenems are preferred over piperacillin/tazobactam. They are shown to have a lower risk of mortality.    Report Status 02/21/2022 FINAL  Final   Organism ID, Bacteria ESCHERICHIA COLI (A)  Final      Susceptibility   Escherichia coli - MIC*    AMPICILLIN >=32 RESISTANT Resistant     CEFAZOLIN >=64 RESISTANT Resistant     CEFEPIME 2 SENSITIVE Sensitive     CEFTRIAXONE >=64 RESISTANT Resistant     CIPROFLOXACIN 0.5 INTERMEDIATE Intermediate     GENTAMICIN >=16 RESISTANT Resistant     IMIPENEM <=0.25 SENSITIVE Sensitive     NITROFURANTOIN <=16 SENSITIVE Sensitive     TRIMETH/SULFA >=320 RESISTANT Resistant     AMPICILLIN/SULBACTAM >=32 RESISTANT Resistant     PIP/TAZO <=4 SENSITIVE Sensitive     * 30,000 COLONIES/mL ESCHERICHIA COLI    Labs: CBC: Recent Labs  Lab 02/17/22 1423 02/18/22 0407 02/19/22 0409 02/21/22 0334 02/22/22 0916  WBC 9.5 7.7 12.3* 14.8* 13.7*  NEUTROABS  --  3.5 6.2 11.7* 7.0  HGB 9.6* 9.3* 9.3* 8.9* 9.3*  HCT 31.0* 29.6* 30.6* 28.4* 29.4*  MCV 96.9 96.7 101.0* 95.9 95.1  PLT 222 194 185 230 818   Basic Metabolic Panel: Recent Labs  Lab 02/17/22 1423 02/18/22  0407 02/19/22 0409 02/20/22 0408 02/22/22 0916  NA 132* 136 133* 138 135  K 3.8 3.5 3.4* 4.1 3.7  CL 101 105 103 107 101  CO2 20* 22 21* 24 25  GLUCOSE 211* 126* 146* 102* 120*  BUN 25* 24* _0 CREATININE 1.14* 1.08* 1.03* 0.97 1.07*  CALCIUM 8.4* 8.5* 8.0* 8.5* 8.4*   Liver Function Tests: No results for input(s): "AST", "ALT", "ALKPHOS", "BILITOT", "PROT", "ALBUMIN" in the last 168 hours. CBG: Recent Labs  Lab 02/22/22 1745 02/22/22 1913 02/22/22 2051 02/23/22 0733 02/23/22 1144  GLUCAP 284* 260* 206* 151* 197*    Discharge time spent: 39 minutes.   Signed: Hosie Poisson, MD [facility] 02/23/2022

## 2022-02-23 NOTE — Care Management Important Message (Signed)
Important Message  Patient Details IM Letter given. Name: Kayla Thompson MRN: [id number] Date of Birth: 10/14/1935   Medicare Important Message Given:  Yes     Kerin Salen 02/23/2022, 12:26 PM

## 2022-02-23 NOTE — Progress Notes (Addendum)
Patient refused IV access after extensive teaching of need for IV antibiotics and ramifications of noncompliance to include death. Patient states understanding of teaching and continues refusal of IV access.

## 2022-02-23 NOTE — Consult Note (Signed)
Consultation Note Date: 02/23/2022   Patient Name: Kayla Thompson  DOB: 1935/05/18  MRN: [id number]  Age / Sex: 86 y.o., female   PCP: [name], MD Referring Physician: Hosie Poisson, MD  Reason for Consultation: Establishing goals of care     Chief Complaint/History of Present Illness:   Patient is an 86 year old female with a past medical history of type 2 diabetes, anxiety, depression, hypothyroidism, CHF, hypertension, hyperlipidemia, GERD, RA, chronic UTIs, and COPD/asthma on intermittent oxygen who was admitted on 02/17/2022 for management of cough associated with decreased appetite and p.o. intake.  Since admission, patient has been diagnosed with influenza and has been receiving appropriate supportive medical care.  Patient has also been managed for AKI in setting of acute illness.  Extensive review of EMR prior to seeing patient.  Patient has DNR form already in place from 2019.  CODE STATUS already appropriately DNR/DNI.  On EMR review, seen that patient had previously been followed by [facility] her care palliative care.  Last note 02/15/2022.  Patient has been discharged from [facility] palliative care services due to  changes in  their criteria for continuation in [facility] home palliative program.  Presented to bedside to see patient. No family present in room. Updated bedside RN regarding patient's care. Patient has been refusing medical interventions. Introduced myself to patient. Tried to inquire about symptom burden and she noted she was fine. She was unable to discussed with me her admission to the hospital or what the plan for her medical care was. Noted her close family was her mother and husband. She wanted to be left along to enjoy her classical music so acknowledged her request and that I would reach out to her daughter to discuss care.  Able to call patient's daughter, [name], after visit with patient. Introduced myself and the role of the palliative  medicine team. Daughter was familiar with palliative medicine with [facility]'s recent involvement. Spent time learning from [place] about patient's life prior to admission. Kayla Thompson described that though patient does not have an official diagnosis of dementia, Kayla Thompson has been assisting the patient for over a year with care and can see the changes in the patient's mentation. Spent time exploring that and based off of the description from [place], it is likely patient had moderate dementia prior to admission. Discussed patient's current mentation worsened by delirium in setting of dementia.   Spent time learning about the patient wand what quality of life would mean to her. Kayla Thompson described how patient is reluctant to seek medical care. She also described how patient found it difficult to talk about "hard" topics and so patient never completed MOST form that was previously provided to her. Kayla Thompson noted though that patient did complete HCPOA of attorney documentation naming her as the HCPOA and her sister as the primary alternate if she is unavailable. Explained that based on patient's current mentation, she is unable to complete MOST form based on her capacity. Glad ACP documentation was completed though so Kayla Thompson and her sister could make loving decisions on patient's behalf.   Discussed possible pathways for medical care moving forward including continuing with aggressive medical management//hospitalizations/etc vs transitioning to focus on patient's comfort at the end of life. With patient's acute illness, it is affected her oral intake and functional status which may now make patient eligible for hospice or at minimum palliative care though another provider. Daughter agreeing with hospice evaluation at this time for patient to potentially go home with hospice support in  addition to hired caregivers/daughter's assistance. Noted would placed referral to TOC. Daughter specifically requested use of [facility]; acknowledged  and informed would also reach out to their liaison.   All questions answered at that time. Thanked daughter for speaking with me today.  Primary Diagnoses  Present on Admission:  Influenza  HLD (hyperlipidemia)  Hypothyroidism  Hyperparathyroidism ([place])  Fibromyalgia  Asthma, chronic  PMR (polymyalgia rheumatica) (HCC)  Chronic heart failure with preserved ejection fraction (HCC)  Memory changes  Severe protein-calorie malnutrition (HCC)  COPD (chronic obstructive pulmonary disease) (HCC)  Chronic respiratory failure with hypoxia ([place])   Palliative Review of Systems: Patient confused though denies pain.   Past Medical History:  Diagnosis Date   Anxiety    Arthritis    Asthma    Cataracts, bilateral    removed by surgery   COPD (chronic obstructive pulmonary disease) ([place])    Depression    Diabetes mellitus without complication ([place])    type 2   Dyspnea    with exertion   Fibromyalgia    GERD (gastroesophageal reflux disease)    History of chemotherapy    History of fractured pelvis    Hypertension    Hypothyroidism    Osteoporosis 12/2017   T score -2.8 stable from prior DEXA   Osteoporosis    Ovarian cancer ([place])    Pneumonia    several times   Social History   Socioeconomic History   Marital status: Widowed    Spouse name: Not on file   Number of children: 2   Years of education: Not on file   Highest education level: Not on file  Occupational History   Not on file  Tobacco Use   Smoking status: Never    Passive exposure: Never   Smokeless tobacco: Never  Vaping Use   Vaping Use: Never used  Substance and Sexual Activity   Alcohol use: No    Alcohol/week: 0.0 standard drinks of alcohol   Drug use: No   Sexual activity: Not Currently    Birth control/protection: Post-menopausal    Comment: declined sexual Hx questions  Other Topics Concern   Not on file  Social History Narrative   Not on file   Social Determinants of Health   Financial  Resource Strain: Low Risk  (08/11/2021)   Overall Financial Resource Strain (CARDIA)    Difficulty of Paying Living Expenses: Not hard at all  Food Insecurity: No Food Insecurity (02/17/2022)   Hunger Vital Sign    Worried About Running Out of Food in the Last Year: Never true    Ran Out of Food in the Last Year: Never true  Transportation Needs: No Transportation Needs (02/17/2022)   PRAPARE - Hydrologist (Medical): No    Lack of Transportation (Non-Medical): No  Physical Activity: Inactive (08/11/2021)   Exercise Vital Sign    Days of Exercise per Week: 0 days    Minutes of Exercise per Session: 0 min  Stress: No Stress Concern Present (08/11/2021)   [place]    Feeling of Stress : Not at all  Social Connections: Moderately Integrated (08/11/2021)   Social Connection and Isolation Panel [NHANES]    Frequency of Communication with Friends and Family: More than three times a week    Frequency of Social Gatherings with Friends and Family: More than three times a week    Attends Religious Services: 1 to 4 times per year  Active Member of Clubs or Organizations: Yes    Attends Archivist Meetings: 1 to 4 times per year    Marital Status: Widowed   Family History  Problem Relation Age of Onset   Lung cancer Mother    CAD Father    Diabetes Father    Lung cancer Maternal Aunt    Diabetes Paternal Aunt    Diabetes Paternal Uncle    Diabetes Paternal Grandmother    Diabetes Paternal Grandfather    Colon cancer Neg Hx    Esophageal cancer Neg Hx    Inflammatory bowel disease Neg Hx    Liver disease Neg Hx    Pancreatic cancer Neg Hx    Rectal cancer Neg Hx    Stomach cancer Neg Hx    Scheduled Meds:  docusate sodium  100 mg Oral Daily   enoxaparin (LOVENOX) injection  30 mg Subcutaneous Q24H   feeding supplement  237 mL Oral BID BM   fluticasone furoate-vilanterol  1 puff  Inhalation Daily   And   umeclidinium bromide  1 puff Inhalation Daily   insulin aspart  0-15 Units Subcutaneous TID WC   insulin aspart  0-5 Units Subcutaneous QHS   insulin glargine-yfgn  5 Units Subcutaneous Daily   levothyroxine  50 mcg Oral Q0600   linezolid  600 mg Oral Q12H   liver oil-zinc oxide   Topical QID   pantoprazole  40 mg Oral Daily   polyethylene glycol  17 g Oral QODAY   predniSONE  1 mg Oral Q M,W,F   rosuvastatin  10 mg Oral QPM   senna  1 tablet Oral Daily   Continuous Infusions: PRN Meds:.acetaminophen **OR** acetaminophen, food thickener, ipratropium-albuterol, ondansetron **OR** ondansetron (ZOFRAN) IV, oxyCODONE, senna-docusate Allergies  Allergen Reactions   Tape Other (See Comments)    SKIN IS VERY THIN AND DELICATE- will tear like paper!!   Lasix [Furosemide] Other (See Comments)    Confusion- MD stopped this med   Latex Rash   Penicillins Rash    Has patient had a PCN reaction causing immediate rash, facial/tongue/throat swelling, SOB or lightheadedness with hypotension: No Has patient had a PCN reaction causing severe rash involving mucus membranes or skin necrosis: No Has patient had a PCN reaction that required hospitalization: No Has patient had a PCN reaction occurring within the last 10 years: No If all of the above answers are "NO", then may proceed with Cephalosporin use.    CBC:    Component Value Date/Time   WBC 13.7 (H) 02/22/2022 0916   HGB 9.3 (L) 02/22/2022 0916   HGB 11.7 11/28/2017 1439   HCT 29.4 (L) 02/22/2022 0916   HCT 36.9 11/28/2017 1438   PLT 280 02/22/2022 0916   PLT 304 11/28/2017 1439   MCV 95.1 02/22/2022 0916   NEUTROABS 7.0 02/22/2022 0916   LYMPHSABS 4.5 (H) 02/22/2022 0916   MONOABS 1.8 (H) 02/22/2022 0916   EOSABS 0.3 02/22/2022 0916   BASOSABS 0.0 02/22/2022 0916   Comprehensive Metabolic Panel:    Component Value Date/Time   NA 135 02/22/2022 0916   NA 140 11/26/2020 1339   K 3.7 02/22/2022 0916    CL 101 02/22/2022 0916   CO2 25 02/22/2022 0916   BUN 10 02/22/2022 0916   BUN 30 (H) 11/26/2020 1339   CREATININE 1.07 (H) 02/22/2022 0916   CREATININE 0.94 (H) 12/07/2019 1440   GLUCOSE 120 (H) 02/22/2022 0916   CALCIUM 8.4 (L) 02/22/2022 0916   AST 41  07/28/2021 0534   ALT 54 (H) 07/28/2021 0534   ALKPHOS 51 07/28/2021 0534   BILITOT 0.6 07/28/2021 0534   PROT 6.0 (L) 07/28/2021 0534   ALBUMIN 3.0 (L) 07/28/2021 0534    Physical Exam: Vital Signs: BP 131/76 (BP Location: Right Arm)   Pulse 86   Temp 98.2 F (36.8 C) (Oral)   Resp 20   Ht '5\' 4"'$  (1.626 m)   Wt 47.7 kg   LMP  (LMP Unknown)   SpO2 94%   BMI 18.05 kg/m  SpO2: SpO2: 94 % O2 Device: O2 Device: Room Air O2 Flow Rate:   Intake/output summary:  Intake/Output Summary (Last 24 hours) at 02/23/2022 1331 Last data filed at 02/23/2022 0606 Gross per 24 hour  Intake --  Output 1800 ml  Net -1800 ml   LBM: Last BM Date : 02/21/22 Baseline Weight: Weight: 47.7 kg Most recent weight: Weight: 47.7 kg  General: NAD, awake, confused, chronically ill appearing, frail Eyes: conjunctiva clear, anicteric sclera HENT: dry mucous membranes Cardiovascular: RRR, no edema in LE b/l Respiratory: no increased work of breathing noted, not in respiratory distress Abdomen: not distended Extremities: moving UEs spontaneously  Skin: no rashes or lesions on visible skin Neuro: confused          Palliative Performance Scale: 30%              Additional Data Reviewed: Recent Labs    02/21/22 0334 02/22/22 0916  WBC 14.8* 13.7*  HGB 8.9* 9.3*  PLT 230 280  NA  --  135  BUN  --  10  CREATININE  --  1.07*    Imaging: DG Chest 2 View CLINICAL DATA:  Leukocytosis.  EXAM: CHEST - 2 VIEW  COMPARISON:  Chest radiograph dated 02/17/2022.  FINDINGS: Mild diffuse chronic interstitial coarsening. Right lung base linear atelectasis/scarring. No focal consolidation, pleural effusion, or pneumothorax. The cardiac silhouette  is within normal limits. Atherosclerotic calcification of the aorta. Osteopenia with degenerative changes of the spine and shoulders. No acute osseous pathology. Old right posterior rib fracture.  IMPRESSION: No active cardiopulmonary disease.  Electronically Signed   By: Anner Crete M.D.   On: 02/22/2022 21:08    I personally reviewed recent imaging.   Palliative Care Assessment and Plan Summary of Established Goals of Care and Medical Treatment Preferences   Patient is an 86 year old female with a past medical history of type 2 diabetes, anxiety, depression, hypothyroidism, CHF, hypertension, hyperlipidemia, GERD, RA, chronic UTIs, and COPD/asthma on intermittent oxygen who was admitted on 02/17/2022 for management of cough associated with decreased appetite and p.o. intake.  Since admission, patient has been diagnosed with influenza and has been receiving appropriate supportive medical care.  Patient has also been managed for AKI in setting of acute illness.  # Complex medical decision making/goals of care  -Patient unable to participate in complex medical decision making due to mentation.  -Spoke at length with patient's daughter, Kayla Thompson, who noted she is HCPOA and her sister is primary alternate as described above in HPI.   -Patient with worsening functional status and intake in the setting of acute viral illness. Daughter elected to proceed with hospice evaluation via [facility]. Await the liaison's input regarding evaluation.   -  Code Status: DNR   # Symptom management  -As per primary team   # Psycho-social/Spiritual Support:  - Support System: daughters  # Discharge Planning:  TBD  Informed hospitalist, TOC, and [facility] liaison regarding patient's care and conversation with  daughter.  Thank you for allowing the palliative care team to participate in the care [name].  Chelsea Aus, DO Palliative Care Provider PMT # [phone]  This provider spent  a total of 83 minutes providing patient's care.  Includes review of EMR, discussing care with other staff members involved in patient's medical care, obtaining relevant history and information from patient and/or patient's family, and personal review of imaging and lab work. Greater than 50% of the time was spent counseling and coordinating care related to the above assessment and plan.

## 2022-02-23 NOTE — Progress Notes (Addendum)
Speech Language Pathology Treatment: Dysphagia  Patient Details Name: Kayla Thompson MRN: [id number] DOB: 12-16-1935 Today's Date: 02/23/2022 Time: 1045-1100 SLP Time Calculation (min) (ACUTE ONLY): 15 min  Assessment / Plan / Recommendation Clinical Impression  SLP follow up for skilled SLP to address pt's dysphagia. Pt greeted in bed, eyes closed- when awoken she reported pain - level 10 chest - though she did not appear in distress. She report this to be new since yesterday and states nothing makes it better/worse. Inquired re: heartburn- and pt admitted to issues but said "I don't know" when asked if symptoms are consistent with her "heartburn". SLP obtained RN who immediately checked on pt - and pt denied any pain to RN despite RN asking questions in several ways. Pt encourged to consume banana and liquids to help "coat" stomach, esophagus if heartburn is resent. She is very xerostomic and erythemic - open mouth breaghing posture. Pt literally consumed only a single sips of water, two bites of banana and single sip of nectar thick cranberry juice. Delayed audible swallow *up to 4 seconds* noted as well as delayed strong coughing (approx 3 seconds after swallow) with thin water. Pt admits to improved comfort while swallowing nectar thick juice and no coughing observed. She declined more intake despite encouragement. Given findings are consistent with prior evaluation - and pt again confirms improved comfort with slightly lthicker liquids- will change to nectar liquids - as SLP questions if pt's overt coughing with thin may contribute to her poor intake. Of note, pt also has history of memory impairment per "snapshot" per DrCrawford 2019. No po documentation of over the weekend - but RN who had pt Sunday reports she consumed cereal *cornflakes" and milk as well as tea; approx 50% of lunch - hamburger and potatoes. Note CXR completed due to leukoytosis 12/4 and was found to have nothing acute.. RN reports  pt refusing her IV today. Pt has previously seen palliative and recommend consider follow up given pt's poor intake and decreased participation, age and progressive nature of medical issues. Informed pt of importance for her to consume intake and have IV- to which she said "I'm ok".     HPI HPI: Pt is an 86 y.o. female with PMHx of DM type 2, anxiety and depression, hypothyroidism, CHF, HTN, HLD, GERD, RA, chronic UTIs, and COPD/asthma on intermittent oxygen,86 yo female referred for MBS by Dr Cathie Olden, Cardiology.  Also h/o ovarian cancer s/p chemo, unintentional weight loss, anxiety, fibromyalgia and bronchiectasis.  Pt with is s/p endoscopy 01/2019 with findings of potential candida, erythematous mucosal in the atrum, dilation performed of the entire esophagus, mucosal rent noted at UES, 4 cm hiatal hernia.   Family reported to this SLP on 07/2021 (for OP MBS) that pt takes her medications with applesauce due to it being easier for her to swallow and coughs sometimes with intake. MBS 07/25/2021 normal swallow with solids, premature of liquids, silent min aspiration of thin with secretions. Recommended regular/thin diet with chin tuck as chin tuck prevented penetration of thin.  11/01/2018 esophagram dysmotility Nonspecific esophageal dysmotility disorder, with considerable tertiary contractions distally in the esophagus, small type 1 HH.Now the the patient admitted and diagnosed with influenza.  She has been seen by [facility] palliative. H/o right lobe atypical pna and prior concern for fecal impaction.  MD ordered swallow eval- Current CXR negative for acute finding and old rib fractures.      SLP Plan  Continue with current plan of care  Recommendations for follow up therapy are one component of a multi-disciplinary discharge planning process, led by the attending physician.  Recommendations may be updated based on patient status, additional functional criteria and insurance authorization.     Recommendations  Diet recommendations: Dysphagia 3 (mechanical soft);Nectar-thick liquid (thin water between meals) Liquids provided via: Cup Medication Administration: Whole meds with puree Compensations: Slow rate;Small sips/bites Postural Changes and/or Swallow Maneuvers: Seated upright 90 degrees;Upright 30-60 min after meal                Oral Care Recommendations: Oral care BID Follow Up Recommendations: Follow physician's recommendations for discharge plan and follow up therapies SLP Visit Diagnosis: Dysphagia, oropharyngeal phase (R13.12);Dysphagia, unspecified (R13.10) Plan: Continue with current plan of care         Kathleen Lime, MS [place] Office [phone] Pager [phone]   Macario Golds  02/23/2022, 11:24 AM

## 2022-02-23 NOTE — Progress Notes (Signed)
Manufacturing engineer [facility]) Hospital Liaison Note  Referral received for patient/family interest in hospice at home. Chart under review by [facility] physician.   Hospice eligibility pending.   Plan is to discharge home tomorrow via private vehicle.   DME in the home: oxygen, BSC, percussion vest, walker, and nebulizer.   No DME needs at this time.   Please send patient home with comfort medications/prescriptions at discharge.   Call with any questions or concerns. Thank you  Roselee Nova, [place] Hospital Liaison [phone]

## 2022-02-24 DIAGNOSIS — J111 Influenza due to unidentified influenza virus with other respiratory manifestations: Secondary | ICD-10-CM | POA: Diagnosis not present

## 2022-02-24 DIAGNOSIS — J4530 Mild persistent asthma, uncomplicated: Secondary | ICD-10-CM | POA: Diagnosis not present

## 2022-02-24 DIAGNOSIS — I5032 Chronic diastolic (congestive) heart failure: Secondary | ICD-10-CM | POA: Diagnosis not present

## 2022-02-24 DIAGNOSIS — J101 Influenza due to other identified influenza virus with other respiratory manifestations: Secondary | ICD-10-CM | POA: Diagnosis not present

## 2022-02-24 DIAGNOSIS — Z515 Encounter for palliative care: Secondary | ICD-10-CM | POA: Diagnosis not present

## 2022-02-24 DIAGNOSIS — J449 Chronic obstructive pulmonary disease, unspecified: Secondary | ICD-10-CM | POA: Diagnosis not present

## 2022-02-24 DIAGNOSIS — N179 Acute kidney failure, unspecified: Secondary | ICD-10-CM | POA: Diagnosis not present

## 2022-02-24 LAB — GLUCOSE, CAPILLARY
Glucose-Capillary: 301 mg/dL — ABNORMAL HIGH (ref 70–99)
Glucose-Capillary: 246 mg/dL — ABNORMAL HIGH (ref 70–99)

## 2022-02-24 MED ORDER — ORAL CARE MOUTH RINSE: 15.0000 mL | OROMUCOSAL | Status: DC | PRN

## 2022-02-24 NOTE — Progress Notes (Signed)
Pt discharged home today per Dr. Roderic Palau. Pt's VSS. Pt's caregiver provided with home medication list, discharge instructions and prescriptions. Verbalized understanding. Pt left floor via stretcher in stable condition accompanied by PTAR.

## 2022-02-24 NOTE — TOC Transition Note (Signed)
Transition of Care [facility]) - CM/SW Discharge Note   Patient Details  Name: Kayla Thompson MRN: [id number] Date of Birth: 10/07/1935  [facility] of [facility] CM/SW Contact:  Illene Regulus, LCSW Phone Number: 02/24/2022, 1:33 PM   Clinical Narrative:    PTAR called for transportation back home. No additional TOC needs will sign off.    Final next level of care: [place] Barriers to Discharge: Continued Medical Work up   Patient Goals and CMS Choice Patient states their goals for this hospitalization and ongoing recovery are:: to return to my home CMS Medicare.gov Compare Post Acute Care list provided to:: Patient Choice offered to / list presented to : Patient  Discharge Placement                       Discharge Plan and Services   Discharge Planning Services: CM Consult Post Acute Care Choice: Home Health                    HH Arranged: RN, PT [facility] Agency: [place] ([facility]) Date [facility] Agency Contacted: 02/19/22 Time [place]: [id number] Representative spoke with at [place]: [place] ([place]) Interventions     Readmission Risk Interventions    07/27/2021    2:37 PM 04/13/2021    9:53 AM 01/12/2021    9:32 AM  Readmission Risk Prevention Plan  Transportation Screening Complete Complete Complete  PCP or Specialist Appt within 3-5 Days Complete  Complete  HRI or [place] Complete  Complete  Social Work Consult for [place] Planning/Counseling Complete    Palliative Care Screening Not Applicable  Not Applicable  Medication Review Press photographer) Complete Complete Complete  PCP or Specialist appointment within 3-5 days of discharge  Complete   HRI or [place]  Complete   SW Recovery Care/Counseling Consult  Complete   [place]  Not Applicable

## 2022-02-24 NOTE — Plan of Care (Signed)

## 2022-02-24 NOTE — Plan of Care (Signed)
Problem: Education: Goal: Knowledge of General Education information will improve Description: Including pain rating scale, medication(s)/side effects and non-pharmacologic comfort measures 02/24/2022 1542 by Annie Sable, RN Outcome: Completed/Met 02/24/2022 0949 by Annie Sable, RN Outcome: Adequate for Discharge   Problem: Health Behavior/Discharge Planning: Goal: Ability to manage health-related needs will improve 02/24/2022 1542 by Annie Sable, RN Outcome: Completed/Met 02/24/2022 0949 by Annie Sable, RN Outcome: Adequate for Discharge   Problem: Clinical Measurements: Goal: Ability to maintain clinical measurements within normal limits will improve 02/24/2022 1542 by Annie Sable, RN Outcome: Completed/Met 02/24/2022 0949 by Annie Sable, RN Outcome: Adequate for Discharge Goal: Will remain free from infection 02/24/2022 1542 by Annie Sable, RN Outcome: Completed/Met 02/24/2022 0949 by Annie Sable, RN Outcome: Adequate for Discharge Goal: Diagnostic test results will improve 02/24/2022 1542 by Annie Sable, RN Outcome: Completed/Met 02/24/2022 0949 by Annie Sable, RN Outcome: Adequate for Discharge Goal: Respiratory complications will improve 02/24/2022 1542 by Annie Sable, RN Outcome: Completed/Met 02/24/2022 0949 by Annie Sable, RN Outcome: Adequate for Discharge Goal: Cardiovascular complication will be avoided 02/24/2022 1542 by Annie Sable, RN Outcome: Completed/Met 02/24/2022 0949 by Annie Sable, RN Outcome: Adequate for Discharge   Problem: Activity: Goal: Risk for activity intolerance will decrease 02/24/2022 1542 by Annie Sable, RN Outcome: Completed/Met 02/24/2022 0949 by Annie Sable, RN Outcome: Adequate for Discharge   Problem: Nutrition: Goal: Adequate nutrition will be maintained 02/24/2022 1542 by Annie Sable, RN Outcome: Completed/Met 02/24/2022 0949 by Annie Sable, RN Outcome: Adequate for  Discharge   Problem: Coping: Goal: Level of anxiety will decrease 02/24/2022 1542 by Annie Sable, RN Outcome: Completed/Met 02/24/2022 0949 by Annie Sable, RN Outcome: Adequate for Discharge   Problem: Elimination: Goal: Will not experience complications related to bowel motility 02/24/2022 1542 by Annie Sable, RN Outcome: Completed/Met 02/24/2022 0949 by Annie Sable, RN Outcome: Adequate for Discharge Goal: Will not experience complications related to urinary retention 02/24/2022 1542 by Annie Sable, RN Outcome: Completed/Met 02/24/2022 0949 by Annie Sable, RN Outcome: Adequate for Discharge   Problem: Pain Managment: Goal: General experience of comfort will improve 02/24/2022 1542 by Annie Sable, RN Outcome: Completed/Met 02/24/2022 0949 by Annie Sable, RN Outcome: Adequate for Discharge   Problem: Safety: Goal: Ability to remain free from injury will improve 02/24/2022 1542 by Annie Sable, RN Outcome: Completed/Met 02/24/2022 0949 by Annie Sable, RN Outcome: Adequate for Discharge   Problem: Skin Integrity: Goal: Risk for impaired skin integrity will decrease 02/24/2022 1542 by Annie Sable, RN Outcome: Completed/Met 02/24/2022 0949 by Annie Sable, RN Outcome: Adequate for Discharge   Problem: Education: Goal: Ability to describe self-care measures that may prevent or decrease complications (Diabetes Survival Skills Education) will improve 02/24/2022 1542 by Annie Sable, RN Outcome: Completed/Met 02/24/2022 0949 by Annie Sable, RN Outcome: Adequate for Discharge Goal: Individualized Educational Video(s) 02/24/2022 1542 by Annie Sable, RN Outcome: Completed/Met 02/24/2022 0949 by Annie Sable, RN Outcome: Adequate for Discharge   Problem: Coping: Goal: Ability to adjust to condition or change in health will improve 02/24/2022 1542 by Annie Sable, RN Outcome: Completed/Met 02/24/2022 0949 by Annie Sable,  RN Outcome: Adequate for Discharge   Problem: Fluid Volume: Goal: Ability to maintain a balanced intake and output will improve 02/24/2022 1542 by Annie Sable, RN Outcome: Completed/Met 02/24/2022 0949 by Annie Sable,  RN Outcome: Adequate for Discharge   Problem: Health Behavior/Discharge Planning: Goal: Ability to identify and utilize available resources and services will improve 02/24/2022 1542 by Annie Sable, RN Outcome: Completed/Met 02/24/2022 0949 by Annie Sable, RN Outcome: Adequate for Discharge Goal: Ability to manage health-related needs will improve 02/24/2022 1542 by Annie Sable, RN Outcome: Completed/Met 02/24/2022 0949 by Annie Sable, RN Outcome: Adequate for Discharge   Problem: Metabolic: Goal: Ability to maintain appropriate glucose levels will improve 02/24/2022 1542 by Annie Sable, RN Outcome: Completed/Met 02/24/2022 0949 by Annie Sable, RN Outcome: Adequate for Discharge   Problem: Nutritional: Goal: Maintenance of adequate nutrition will improve 02/24/2022 1542 by Annie Sable, RN Outcome: Completed/Met 02/24/2022 0949 by Annie Sable, RN Outcome: Adequate for Discharge Goal: Progress toward achieving an optimal weight will improve 02/24/2022 1542 by Annie Sable, RN Outcome: Completed/Met 02/24/2022 0949 by Annie Sable, RN Outcome: Adequate for Discharge   Problem: Skin Integrity: Goal: Risk for impaired skin integrity will decrease 02/24/2022 1542 by Annie Sable, RN Outcome: Completed/Met 02/24/2022 0949 by Annie Sable, RN Outcome: Adequate for Discharge   Problem: Tissue Perfusion: Goal: Adequacy of tissue perfusion will improve 02/24/2022 1542 by Annie Sable, RN Outcome: Completed/Met 02/24/2022 0949 by Annie Sable, RN Outcome: Adequate for Discharge

## 2022-02-24 NOTE — Progress Notes (Signed)
Daily Progress Note   Patient Name: Kayla Thompson       Date: 02/24/2022 DOB: 1935-09-11  Age: 86 y.o. MRN#: [id number] Attending Physician: Kathie Dike, MD Primary Care Physician: [name], MD Admit Date: 02/17/2022 Length of Stay: 7 days  Reason for Consultation/Follow-up: Establishing goals of care  Subjective:   Patient resting comfortably in bed.  Able to call daughter, [name], to follow-up regarding plans for care of patient at home.  Wanted noted she had spoke to [facility] liaison.  Patient will be evaluated once at home to determine hospice versus palliative support moving forward.  Hope is to discharge home today if patient can be discharged when primary daytime caregiver can assist with patient's care as she knows patient well.  Daughter noted daytime caregiver finishes at 4:30 PM and so if patient cannot be transferred home before then, would likely need to discharge tomorrow morning instead.  Noted would pass this information to patient's primary hospitalist and social worker to assist with coordination of care.  Answered all questions at that time.  Thank daughter for allowing me to speak with her today.  Objective:   Vital Signs:  BP (!) 100/57 (BP Location: Right Arm)   Pulse 83   Temp 98.1 F (36.7 C) (Oral)   Resp 20   Ht '5\' 4"'$  (1.626 m)   Wt 47.7 kg   LMP  (LMP Unknown)   SpO2 94%   BMI 18.05 kg/m   Physical Exam: General: NAD, chronically ill appearing, frail Eyes: conjunctiva clear, anicteric sclera HENT: dry mucous membranes Cardiovascular: RRR, no edema in LE b/l Respiratory: no increased work of breathing noted, not in respiratory distress Abdomen: not distended Extremities: moving UEs spontaneously  Skin: no rashes or lesions on visible skin  Assessment & Plan:   Assessment: Patient is an 86 year old female with a past medical history of type 2 diabetes, anxiety, depression, hypothyroidism, CHF, hypertension, hyperlipidemia, GERD, RA,  chronic UTIs, and COPD/asthma on intermittent oxygen who was admitted on 02/17/2022 for management of cough associated with decreased appetite and p.o. intake. Since admission, patient has been diagnosed with influenza and has been receiving appropriate supportive medical care. Patient has also been managed for AKI in setting of acute illness.   Recommendations/Plan: # Complex medical decision making/goals of care:  -Patient unable to participate in complex medical decision making due to mentation.                -Spoke at length with patient's daughter, [name], again today as described above in HPI to assist with coordination of care. Patient planning to return home with support from [facility] to evaluate for hospice VS palliative care once patient returns home. Daughter coordinating patient's caregivers at home.   -  Code Status: DNR  # Symptom management:  -As per primary team    # Psycho-social/Spiritual Support:  - Support System: daughters  # Discharge Planning: Home, ACC to evaluate for either palliative care VS hospice  Discussed with: hospitalist, TOC/CM  Thank you for allowing the palliative care team to participate in the care [name]. As goals for medical care are currently determined, palliative medicine team will sign off. Please reach out if our team can be of further assistance in the future.   Chelsea Aus, DO Palliative Care Provider PMT # [phone]  This provider spent a total of 35 minutes providing patient's care.  Includes review of EMR, discussing care with other staff members involved in patient's medical care,  obtaining relevant history and information from patient and/or patient's family, and personal review of imaging and lab work. Greater than 50% of the time was spent counseling and coordinating care related to the above assessment and plan.

## 2022-02-24 NOTE — Progress Notes (Signed)
Patient seen and examined.  Appears to be resting comfortably.  She denies any shortness of breath.  Caregiver is at the bedside.  Her care was reviewed with her daughter over the phone during my visit.  Patient admitted to the hospital with influenza infection.  There was concern for possible superimposed pneumonia and she was treated with antibiotics.  She also had urinary tract infection was treated with fosfomycin.  Infectious disease evaluated the patient and did not recommend any further antibiotics on discharge.  She was noted to be dehydrated on admission improved with IV fluids.  She is on chronic prednisone for polymyalgia rheumatica.  For dysphagia, she is on dysphagia 3 diet with nectar thick liquids.  Due to her multiple medical problems, frailty and advanced age, she was seen by palliative care.  The remainder of her medical problems have been stable.  Patient is already DNR.  Referral has been made for outpatient hospice/palliative to evaluate at home.  Patient does have caregivers at home.  Plan is to discharge her home today pending transport availability.  No change in plan of care or medications since discharge summary done yesterday.  Patient is stable for discharge home today.  [employer]

## 2022-02-25 ENCOUNTER — Telehealth: Payer: Self-pay | Admitting: Internal Medicine

## 2022-02-25 NOTE — Telephone Encounter (Signed)
Morena from [facility] called on behalf of family and patient. States that the family would like Dr. Sharlet Salina to be the hospice attending physician. She would also like to know if she would sign the care orders or should the [facility] physician sign them. She would also like for Dr. Sharlet Salina to provide oral certification of terminal illness dating that the patient has a life expectancy of 6 months or less if the illness runs its normal course. A good callback number for Gwen Pounds would be [phone].

## 2022-02-25 NOTE — Telephone Encounter (Signed)
Fine to be hospital attending. Okay for hospice to do symptom management. Dx Chronic respiratory failure with hypoxia. Life expenctancy 6 months or less.

## 2022-02-25 NOTE — Telephone Encounter (Signed)
Red back results for Ms Kayla Thompson

## 2022-02-27 DIAGNOSIS — J449 Chronic obstructive pulmonary disease, unspecified: Secondary | ICD-10-CM | POA: Diagnosis not present

## 2022-02-27 DIAGNOSIS — M797 Fibromyalgia: Secondary | ICD-10-CM | POA: Diagnosis not present

## 2022-02-27 DIAGNOSIS — Z87440 Personal history of urinary (tract) infections: Secondary | ICD-10-CM | POA: Diagnosis not present

## 2022-02-27 DIAGNOSIS — K219 Gastro-esophageal reflux disease without esophagitis: Secondary | ICD-10-CM | POA: Diagnosis not present

## 2022-02-27 DIAGNOSIS — E785 Hyperlipidemia, unspecified: Secondary | ICD-10-CM | POA: Diagnosis not present

## 2022-02-27 DIAGNOSIS — J302 Other seasonal allergic rhinitis: Secondary | ICD-10-CM | POA: Diagnosis not present

## 2022-02-27 DIAGNOSIS — E43 Unspecified severe protein-calorie malnutrition: Secondary | ICD-10-CM | POA: Diagnosis not present

## 2022-02-27 DIAGNOSIS — I509 Heart failure, unspecified: Secondary | ICD-10-CM | POA: Diagnosis not present

## 2022-02-27 DIAGNOSIS — E119 Type 2 diabetes mellitus without complications: Secondary | ICD-10-CM | POA: Diagnosis not present

## 2022-02-27 DIAGNOSIS — F32A Depression, unspecified: Secondary | ICD-10-CM | POA: Diagnosis not present

## 2022-02-27 DIAGNOSIS — E039 Hypothyroidism, unspecified: Secondary | ICD-10-CM | POA: Diagnosis not present

## 2022-02-27 DIAGNOSIS — R159 Full incontinence of feces: Secondary | ICD-10-CM | POA: Diagnosis not present

## 2022-02-27 DIAGNOSIS — R32 Unspecified urinary incontinence: Secondary | ICD-10-CM | POA: Diagnosis not present

## 2022-03-01 ENCOUNTER — Other Ambulatory Visit: Payer: Self-pay | Admitting: Internal Medicine

## 2022-03-01 ENCOUNTER — Ambulatory Visit: Payer: Medicare Other | Admitting: Internal Medicine

## 2022-03-01 DIAGNOSIS — E039 Hypothyroidism, unspecified: Secondary | ICD-10-CM

## 2022-03-02 DIAGNOSIS — I509 Heart failure, unspecified: Secondary | ICD-10-CM | POA: Diagnosis not present

## 2022-03-02 DIAGNOSIS — E43 Unspecified severe protein-calorie malnutrition: Secondary | ICD-10-CM | POA: Diagnosis not present

## 2022-03-02 DIAGNOSIS — R32 Unspecified urinary incontinence: Secondary | ICD-10-CM | POA: Diagnosis not present

## 2022-03-02 DIAGNOSIS — K219 Gastro-esophageal reflux disease without esophagitis: Secondary | ICD-10-CM | POA: Diagnosis not present

## 2022-03-02 DIAGNOSIS — J449 Chronic obstructive pulmonary disease, unspecified: Secondary | ICD-10-CM | POA: Diagnosis not present

## 2022-03-02 DIAGNOSIS — F32A Depression, unspecified: Secondary | ICD-10-CM | POA: Diagnosis not present

## 2022-03-08 DIAGNOSIS — F32A Depression, unspecified: Secondary | ICD-10-CM | POA: Diagnosis not present

## 2022-03-08 DIAGNOSIS — I509 Heart failure, unspecified: Secondary | ICD-10-CM | POA: Diagnosis not present

## 2022-03-08 DIAGNOSIS — J449 Chronic obstructive pulmonary disease, unspecified: Secondary | ICD-10-CM | POA: Diagnosis not present

## 2022-03-08 DIAGNOSIS — R32 Unspecified urinary incontinence: Secondary | ICD-10-CM | POA: Diagnosis not present

## 2022-03-08 DIAGNOSIS — E43 Unspecified severe protein-calorie malnutrition: Secondary | ICD-10-CM | POA: Diagnosis not present

## 2022-03-08 DIAGNOSIS — K219 Gastro-esophageal reflux disease without esophagitis: Secondary | ICD-10-CM | POA: Diagnosis not present

## 2022-03-10 ENCOUNTER — Ambulatory Visit: Payer: Medicare Other | Admitting: Internal Medicine

## 2022-03-17 DIAGNOSIS — E43 Unspecified severe protein-calorie malnutrition: Secondary | ICD-10-CM | POA: Diagnosis not present

## 2022-03-17 DIAGNOSIS — I509 Heart failure, unspecified: Secondary | ICD-10-CM | POA: Diagnosis not present

## 2022-03-17 DIAGNOSIS — R32 Unspecified urinary incontinence: Secondary | ICD-10-CM | POA: Diagnosis not present

## 2022-03-17 DIAGNOSIS — K219 Gastro-esophageal reflux disease without esophagitis: Secondary | ICD-10-CM | POA: Diagnosis not present

## 2022-03-17 DIAGNOSIS — J449 Chronic obstructive pulmonary disease, unspecified: Secondary | ICD-10-CM | POA: Diagnosis not present

## 2022-03-17 DIAGNOSIS — F32A Depression, unspecified: Secondary | ICD-10-CM | POA: Diagnosis not present

## 2022-03-18 DIAGNOSIS — F32A Depression, unspecified: Secondary | ICD-10-CM | POA: Diagnosis not present

## 2022-03-18 DIAGNOSIS — I509 Heart failure, unspecified: Secondary | ICD-10-CM | POA: Diagnosis not present

## 2022-03-18 DIAGNOSIS — E43 Unspecified severe protein-calorie malnutrition: Secondary | ICD-10-CM | POA: Diagnosis not present

## 2022-03-18 DIAGNOSIS — K219 Gastro-esophageal reflux disease without esophagitis: Secondary | ICD-10-CM | POA: Diagnosis not present

## 2022-03-18 DIAGNOSIS — J449 Chronic obstructive pulmonary disease, unspecified: Secondary | ICD-10-CM | POA: Diagnosis not present

## 2022-03-18 DIAGNOSIS — R32 Unspecified urinary incontinence: Secondary | ICD-10-CM | POA: Diagnosis not present

## 2022-03-22 DIAGNOSIS — J449 Chronic obstructive pulmonary disease, unspecified: Secondary | ICD-10-CM | POA: Diagnosis not present

## 2022-03-22 DIAGNOSIS — I509 Heart failure, unspecified: Secondary | ICD-10-CM | POA: Diagnosis not present

## 2022-03-22 DIAGNOSIS — E785 Hyperlipidemia, unspecified: Secondary | ICD-10-CM | POA: Diagnosis not present

## 2022-03-22 DIAGNOSIS — E119 Type 2 diabetes mellitus without complications: Secondary | ICD-10-CM | POA: Diagnosis not present

## 2022-03-22 DIAGNOSIS — R32 Unspecified urinary incontinence: Secondary | ICD-10-CM | POA: Diagnosis not present

## 2022-03-22 DIAGNOSIS — J302 Other seasonal allergic rhinitis: Secondary | ICD-10-CM | POA: Diagnosis not present

## 2022-03-22 DIAGNOSIS — R159 Full incontinence of feces: Secondary | ICD-10-CM | POA: Diagnosis not present

## 2022-03-22 DIAGNOSIS — Z87440 Personal history of urinary (tract) infections: Secondary | ICD-10-CM | POA: Diagnosis not present

## 2022-03-22 DIAGNOSIS — K219 Gastro-esophageal reflux disease without esophagitis: Secondary | ICD-10-CM | POA: Diagnosis not present

## 2022-03-22 DIAGNOSIS — M797 Fibromyalgia: Secondary | ICD-10-CM | POA: Diagnosis not present

## 2022-03-22 DIAGNOSIS — E43 Unspecified severe protein-calorie malnutrition: Secondary | ICD-10-CM | POA: Diagnosis not present

## 2022-03-22 DIAGNOSIS — E039 Hypothyroidism, unspecified: Secondary | ICD-10-CM | POA: Diagnosis not present

## 2022-03-22 DIAGNOSIS — F32A Depression, unspecified: Secondary | ICD-10-CM | POA: Diagnosis not present

## 2022-03-23 DIAGNOSIS — J449 Chronic obstructive pulmonary disease, unspecified: Secondary | ICD-10-CM | POA: Diagnosis not present

## 2022-03-23 DIAGNOSIS — F32A Depression, unspecified: Secondary | ICD-10-CM | POA: Diagnosis not present

## 2022-03-23 DIAGNOSIS — I509 Heart failure, unspecified: Secondary | ICD-10-CM | POA: Diagnosis not present

## 2022-03-23 DIAGNOSIS — R32 Unspecified urinary incontinence: Secondary | ICD-10-CM | POA: Diagnosis not present

## 2022-03-23 DIAGNOSIS — E43 Unspecified severe protein-calorie malnutrition: Secondary | ICD-10-CM | POA: Diagnosis not present

## 2022-03-23 DIAGNOSIS — K219 Gastro-esophageal reflux disease without esophagitis: Secondary | ICD-10-CM | POA: Diagnosis not present

## 2022-03-26 DIAGNOSIS — R32 Unspecified urinary incontinence: Secondary | ICD-10-CM | POA: Diagnosis not present

## 2022-03-26 DIAGNOSIS — I509 Heart failure, unspecified: Secondary | ICD-10-CM | POA: Diagnosis not present

## 2022-03-26 DIAGNOSIS — F32A Depression, unspecified: Secondary | ICD-10-CM | POA: Diagnosis not present

## 2022-03-26 DIAGNOSIS — E43 Unspecified severe protein-calorie malnutrition: Secondary | ICD-10-CM | POA: Diagnosis not present

## 2022-03-26 DIAGNOSIS — J449 Chronic obstructive pulmonary disease, unspecified: Secondary | ICD-10-CM | POA: Diagnosis not present

## 2022-03-26 DIAGNOSIS — K219 Gastro-esophageal reflux disease without esophagitis: Secondary | ICD-10-CM | POA: Diagnosis not present

## 2022-03-29 DIAGNOSIS — I509 Heart failure, unspecified: Secondary | ICD-10-CM | POA: Diagnosis not present

## 2022-03-29 DIAGNOSIS — R32 Unspecified urinary incontinence: Secondary | ICD-10-CM | POA: Diagnosis not present

## 2022-03-29 DIAGNOSIS — J449 Chronic obstructive pulmonary disease, unspecified: Secondary | ICD-10-CM | POA: Diagnosis not present

## 2022-03-29 DIAGNOSIS — E43 Unspecified severe protein-calorie malnutrition: Secondary | ICD-10-CM | POA: Diagnosis not present

## 2022-03-29 DIAGNOSIS — K219 Gastro-esophageal reflux disease without esophagitis: Secondary | ICD-10-CM | POA: Diagnosis not present

## 2022-03-29 DIAGNOSIS — F32A Depression, unspecified: Secondary | ICD-10-CM | POA: Diagnosis not present

## 2022-04-02 DIAGNOSIS — R32 Unspecified urinary incontinence: Secondary | ICD-10-CM | POA: Diagnosis not present

## 2022-04-02 DIAGNOSIS — F32A Depression, unspecified: Secondary | ICD-10-CM | POA: Diagnosis not present

## 2022-04-02 DIAGNOSIS — J449 Chronic obstructive pulmonary disease, unspecified: Secondary | ICD-10-CM | POA: Diagnosis not present

## 2022-04-02 DIAGNOSIS — I509 Heart failure, unspecified: Secondary | ICD-10-CM | POA: Diagnosis not present

## 2022-04-02 DIAGNOSIS — K219 Gastro-esophageal reflux disease without esophagitis: Secondary | ICD-10-CM | POA: Diagnosis not present

## 2022-04-02 DIAGNOSIS — E43 Unspecified severe protein-calorie malnutrition: Secondary | ICD-10-CM | POA: Diagnosis not present

## 2022-04-03 NOTE — Progress Notes (Deleted)
We will repeatHPI Kayla Thompson never smoker followed for asthma/ COPD/ Tracheobronchomalacia,Bronchiectasis, Granulomatous lung disease,  Chronic Hypoxic Resp Failure,  GERD, complicated by  DM2, Hypothyroid, HBP, dCHF Office Spirometry 01/07/15- Mild obstructive airways disease- FVC 7.2/ 100%, FEV1 1.4/ 83%, FEV1/FVC 0.62, FEF25-75% 44% PFT 02/26/2019- Mild obstruction, Diffusion moderately reduced, no resp to BD, -----------------------------------------------------------------------------------------  02/26/21- 85 yoF never smoker followed for asthma/ COPD/ Tracheobronchomalacia, Bronchiectasis, Granulomatous lung disease,  GERD, complicated by  DM2, Hypothyroid, HBP, dCHF, GERD, Chronic UTI, Chronic dCHF, PMR, Depression,  O2 2L [employer],  -Rescue inhaler 1-2x/ week, Trelegy 100 and maintenance prednisone 61m( 1 mg x 2), Nebulizer Duoneb. Singulair, Mucinex DM,  Covid vax- 4 Phizer Flu vax- declines -----Patient feels she is doing well with her breathing.  Here with  aide who reports she is using inhaled medicines as directed.  No exacerbation.  Rarely uses oxygen and does not routinely sleep with it-discussed. CXR 11/26/20- IMPRESSION: No active cardiopulmonary disease.  04/05/22-  86 yoF never smoker followed for asthma/ COPD/ Tracheobronchomalacia, Bronchiectasis, Granulomatous lung disease,  GERD, complicated by  DM2, Hypothyroid, HBP, dCHF, GERD, Chronic UTI,  PMR, Depression,  O2 2L [employer],  -Rescue inhaler 1-2x/ week, Trelegy 100 and maintenance prednisone 22m 1 mg x 2), Nebulizer Duoneb. Singulair, Mucinex DM,  Covid vax- 4 Phizer Flu vax-  Hosp 11/29-12/6/23- Influenza A   CXR 02/22/22- MPRESSION: No active cardiopulmonary disease.  ,  ROS-see HPI   + = positive Constitutional:    weight loss, night sweats, fevers, chills, fatigue, lassitude. HEENT:    headaches, +difficulty swallowing, tooth/dental problems, sore throat,  sneezing, itching, ear ache, nasal congestion, post nasal drip,  snoring CV:    chest pain, orthopnea, PND, swelling in lower extremities, anasarca,  dizziness, palpitations Resp:   +shortness of breath with exertion or at rest.                +productive cough,  + non-productive cough, coughing up of blood.              change in color of mucus.  +wheezing.   Skin:    rash or lesions. GI:  No-   heartburn, indigestion, abdominal pain, nausea, vomiting, diarrhea,                 change in bowel habits, loss of appetite GU: dysuria, change in color of urine, no urgency or frequency.   flank pain. MS:   +joint pain, stiffness, decreased range of motion, back pain. Neuro-     nothing unusual Psych:  change in mood or affect.  depression or anxiety.   memory loss.  OBJ- Physical Exam General- Alert, Oriented, Affect-appropriate, Distress- none acute, 94% on room air.  +Wheelchair + overweight Skin- rash-none, lesions- none, excoriation- none Lymphadenopathy- none Head- atraumatic            Eyes- Gross vision intact, PERRLA, conjunctivae and secretions clear            Ears- Hearing, canals-normal            Nose- Clear, no-Septal dev, mucus, polyps, erosion, perforation             Throat- Mallampati II , mucosa clear , drainage- none, tonsils- atrophic Neck- flexible , trachea midline, no stridor , thyroid nl, carotid no bruit Chest - symmetrical excursion , unlabored           Heart/CV- RRR , no murmur , no gallop  , no rub, nl s1 s2                           -  JVD- none , edema- none, stasis changes- none, varices- none           Lung- +quiet, clear, wheeze-none, cough- none , dullness-none, rub- none           Chest wall-  Abd-  Br/ Gen/ Rectal- Not done, not indicated Extrem- cyanosis- none, clubbing, none, atrophy- none, strength- nl Neuro- grossly intact to observation

## 2022-04-05 ENCOUNTER — Ambulatory Visit: Payer: Medicare Other | Admitting: Internal Medicine

## 2022-04-05 DIAGNOSIS — E43 Unspecified severe protein-calorie malnutrition: Secondary | ICD-10-CM | POA: Diagnosis not present

## 2022-04-05 DIAGNOSIS — F32A Depression, unspecified: Secondary | ICD-10-CM | POA: Diagnosis not present

## 2022-04-05 DIAGNOSIS — I509 Heart failure, unspecified: Secondary | ICD-10-CM | POA: Diagnosis not present

## 2022-04-05 DIAGNOSIS — J449 Chronic obstructive pulmonary disease, unspecified: Secondary | ICD-10-CM | POA: Diagnosis not present

## 2022-04-05 DIAGNOSIS — R32 Unspecified urinary incontinence: Secondary | ICD-10-CM | POA: Diagnosis not present

## 2022-04-05 DIAGNOSIS — K219 Gastro-esophageal reflux disease without esophagitis: Secondary | ICD-10-CM | POA: Diagnosis not present

## 2022-04-08 DIAGNOSIS — J449 Chronic obstructive pulmonary disease, unspecified: Secondary | ICD-10-CM | POA: Diagnosis not present

## 2022-04-08 DIAGNOSIS — I509 Heart failure, unspecified: Secondary | ICD-10-CM | POA: Diagnosis not present

## 2022-04-08 DIAGNOSIS — R32 Unspecified urinary incontinence: Secondary | ICD-10-CM | POA: Diagnosis not present

## 2022-04-08 DIAGNOSIS — F32A Depression, unspecified: Secondary | ICD-10-CM | POA: Diagnosis not present

## 2022-04-08 DIAGNOSIS — E43 Unspecified severe protein-calorie malnutrition: Secondary | ICD-10-CM | POA: Diagnosis not present

## 2022-04-08 DIAGNOSIS — K219 Gastro-esophageal reflux disease without esophagitis: Secondary | ICD-10-CM | POA: Diagnosis not present

## 2022-04-09 DIAGNOSIS — J449 Chronic obstructive pulmonary disease, unspecified: Secondary | ICD-10-CM | POA: Diagnosis not present

## 2022-04-09 DIAGNOSIS — K219 Gastro-esophageal reflux disease without esophagitis: Secondary | ICD-10-CM | POA: Diagnosis not present

## 2022-04-09 DIAGNOSIS — E43 Unspecified severe protein-calorie malnutrition: Secondary | ICD-10-CM | POA: Diagnosis not present

## 2022-04-09 DIAGNOSIS — F32A Depression, unspecified: Secondary | ICD-10-CM | POA: Diagnosis not present

## 2022-04-09 DIAGNOSIS — I509 Heart failure, unspecified: Secondary | ICD-10-CM | POA: Diagnosis not present

## 2022-04-09 DIAGNOSIS — R32 Unspecified urinary incontinence: Secondary | ICD-10-CM | POA: Diagnosis not present

## 2022-04-10 DIAGNOSIS — J449 Chronic obstructive pulmonary disease, unspecified: Secondary | ICD-10-CM | POA: Diagnosis not present

## 2022-04-10 DIAGNOSIS — E43 Unspecified severe protein-calorie malnutrition: Secondary | ICD-10-CM | POA: Diagnosis not present

## 2022-04-10 DIAGNOSIS — I509 Heart failure, unspecified: Secondary | ICD-10-CM | POA: Diagnosis not present

## 2022-04-10 DIAGNOSIS — K219 Gastro-esophageal reflux disease without esophagitis: Secondary | ICD-10-CM | POA: Diagnosis not present

## 2022-04-10 DIAGNOSIS — F32A Depression, unspecified: Secondary | ICD-10-CM | POA: Diagnosis not present

## 2022-04-10 DIAGNOSIS — R32 Unspecified urinary incontinence: Secondary | ICD-10-CM | POA: Diagnosis not present

## 2022-04-15 DIAGNOSIS — R32 Unspecified urinary incontinence: Secondary | ICD-10-CM | POA: Diagnosis not present

## 2022-04-15 DIAGNOSIS — E43 Unspecified severe protein-calorie malnutrition: Secondary | ICD-10-CM | POA: Diagnosis not present

## 2022-04-15 DIAGNOSIS — F32A Depression, unspecified: Secondary | ICD-10-CM | POA: Diagnosis not present

## 2022-04-15 DIAGNOSIS — K219 Gastro-esophageal reflux disease without esophagitis: Secondary | ICD-10-CM | POA: Diagnosis not present

## 2022-04-15 DIAGNOSIS — I509 Heart failure, unspecified: Secondary | ICD-10-CM | POA: Diagnosis not present

## 2022-04-15 DIAGNOSIS — J449 Chronic obstructive pulmonary disease, unspecified: Secondary | ICD-10-CM | POA: Diagnosis not present

## 2022-04-16 DIAGNOSIS — J449 Chronic obstructive pulmonary disease, unspecified: Secondary | ICD-10-CM | POA: Diagnosis not present

## 2022-04-16 DIAGNOSIS — E43 Unspecified severe protein-calorie malnutrition: Secondary | ICD-10-CM | POA: Diagnosis not present

## 2022-04-16 DIAGNOSIS — I509 Heart failure, unspecified: Secondary | ICD-10-CM | POA: Diagnosis not present

## 2022-04-16 DIAGNOSIS — R32 Unspecified urinary incontinence: Secondary | ICD-10-CM | POA: Diagnosis not present

## 2022-04-16 DIAGNOSIS — F32A Depression, unspecified: Secondary | ICD-10-CM | POA: Diagnosis not present

## 2022-04-16 DIAGNOSIS — K219 Gastro-esophageal reflux disease without esophagitis: Secondary | ICD-10-CM | POA: Diagnosis not present

## 2022-04-19 DIAGNOSIS — F32A Depression, unspecified: Secondary | ICD-10-CM | POA: Diagnosis not present

## 2022-04-19 DIAGNOSIS — I509 Heart failure, unspecified: Secondary | ICD-10-CM | POA: Diagnosis not present

## 2022-04-19 DIAGNOSIS — J449 Chronic obstructive pulmonary disease, unspecified: Secondary | ICD-10-CM | POA: Diagnosis not present

## 2022-04-19 DIAGNOSIS — K219 Gastro-esophageal reflux disease without esophagitis: Secondary | ICD-10-CM | POA: Diagnosis not present

## 2022-04-19 DIAGNOSIS — E43 Unspecified severe protein-calorie malnutrition: Secondary | ICD-10-CM | POA: Diagnosis not present

## 2022-04-19 DIAGNOSIS — R32 Unspecified urinary incontinence: Secondary | ICD-10-CM | POA: Diagnosis not present

## 2022-04-22 DIAGNOSIS — E119 Type 2 diabetes mellitus without complications: Secondary | ICD-10-CM | POA: Diagnosis not present

## 2022-04-22 DIAGNOSIS — M797 Fibromyalgia: Secondary | ICD-10-CM | POA: Diagnosis not present

## 2022-04-22 DIAGNOSIS — I509 Heart failure, unspecified: Secondary | ICD-10-CM | POA: Diagnosis not present

## 2022-04-22 DIAGNOSIS — R159 Full incontinence of feces: Secondary | ICD-10-CM | POA: Diagnosis not present

## 2022-04-22 DIAGNOSIS — Z87440 Personal history of urinary (tract) infections: Secondary | ICD-10-CM | POA: Diagnosis not present

## 2022-04-22 DIAGNOSIS — J302 Other seasonal allergic rhinitis: Secondary | ICD-10-CM | POA: Diagnosis not present

## 2022-04-22 DIAGNOSIS — R32 Unspecified urinary incontinence: Secondary | ICD-10-CM | POA: Diagnosis not present

## 2022-04-22 DIAGNOSIS — E785 Hyperlipidemia, unspecified: Secondary | ICD-10-CM | POA: Diagnosis not present

## 2022-04-22 DIAGNOSIS — K219 Gastro-esophageal reflux disease without esophagitis: Secondary | ICD-10-CM | POA: Diagnosis not present

## 2022-04-22 DIAGNOSIS — E43 Unspecified severe protein-calorie malnutrition: Secondary | ICD-10-CM | POA: Diagnosis not present

## 2022-04-22 DIAGNOSIS — J449 Chronic obstructive pulmonary disease, unspecified: Secondary | ICD-10-CM | POA: Diagnosis not present

## 2022-04-22 DIAGNOSIS — F32A Depression, unspecified: Secondary | ICD-10-CM | POA: Diagnosis not present

## 2022-04-22 DIAGNOSIS — E039 Hypothyroidism, unspecified: Secondary | ICD-10-CM | POA: Diagnosis not present

## 2022-04-23 DIAGNOSIS — R32 Unspecified urinary incontinence: Secondary | ICD-10-CM | POA: Diagnosis not present

## 2022-04-23 DIAGNOSIS — E43 Unspecified severe protein-calorie malnutrition: Secondary | ICD-10-CM | POA: Diagnosis not present

## 2022-04-23 DIAGNOSIS — I509 Heart failure, unspecified: Secondary | ICD-10-CM | POA: Diagnosis not present

## 2022-04-23 DIAGNOSIS — F32A Depression, unspecified: Secondary | ICD-10-CM | POA: Diagnosis not present

## 2022-04-23 DIAGNOSIS — K219 Gastro-esophageal reflux disease without esophagitis: Secondary | ICD-10-CM | POA: Diagnosis not present

## 2022-04-23 DIAGNOSIS — J449 Chronic obstructive pulmonary disease, unspecified: Secondary | ICD-10-CM | POA: Diagnosis not present

## 2022-04-24 ENCOUNTER — Other Ambulatory Visit: Payer: Self-pay | Admitting: Internal Medicine

## 2022-04-24 DIAGNOSIS — J449 Chronic obstructive pulmonary disease, unspecified: Secondary | ICD-10-CM | POA: Diagnosis not present

## 2022-04-24 DIAGNOSIS — I509 Heart failure, unspecified: Secondary | ICD-10-CM | POA: Diagnosis not present

## 2022-04-24 DIAGNOSIS — R32 Unspecified urinary incontinence: Secondary | ICD-10-CM | POA: Diagnosis not present

## 2022-04-24 DIAGNOSIS — E43 Unspecified severe protein-calorie malnutrition: Secondary | ICD-10-CM | POA: Diagnosis not present

## 2022-04-24 DIAGNOSIS — F32A Depression, unspecified: Secondary | ICD-10-CM | POA: Diagnosis not present

## 2022-04-24 DIAGNOSIS — K219 Gastro-esophageal reflux disease without esophagitis: Secondary | ICD-10-CM | POA: Diagnosis not present

## 2022-04-29 DIAGNOSIS — J449 Chronic obstructive pulmonary disease, unspecified: Secondary | ICD-10-CM | POA: Diagnosis not present

## 2022-04-29 DIAGNOSIS — R32 Unspecified urinary incontinence: Secondary | ICD-10-CM | POA: Diagnosis not present

## 2022-04-29 DIAGNOSIS — E43 Unspecified severe protein-calorie malnutrition: Secondary | ICD-10-CM | POA: Diagnosis not present

## 2022-04-29 DIAGNOSIS — I509 Heart failure, unspecified: Secondary | ICD-10-CM | POA: Diagnosis not present

## 2022-04-29 DIAGNOSIS — K219 Gastro-esophageal reflux disease without esophagitis: Secondary | ICD-10-CM | POA: Diagnosis not present

## 2022-04-29 DIAGNOSIS — F32A Depression, unspecified: Secondary | ICD-10-CM | POA: Diagnosis not present

## 2022-04-30 DIAGNOSIS — K219 Gastro-esophageal reflux disease without esophagitis: Secondary | ICD-10-CM | POA: Diagnosis not present

## 2022-04-30 DIAGNOSIS — I509 Heart failure, unspecified: Secondary | ICD-10-CM | POA: Diagnosis not present

## 2022-04-30 DIAGNOSIS — E43 Unspecified severe protein-calorie malnutrition: Secondary | ICD-10-CM | POA: Diagnosis not present

## 2022-04-30 DIAGNOSIS — R32 Unspecified urinary incontinence: Secondary | ICD-10-CM | POA: Diagnosis not present

## 2022-04-30 DIAGNOSIS — J449 Chronic obstructive pulmonary disease, unspecified: Secondary | ICD-10-CM | POA: Diagnosis not present

## 2022-04-30 DIAGNOSIS — F32A Depression, unspecified: Secondary | ICD-10-CM | POA: Diagnosis not present

## 2022-05-03 ENCOUNTER — Telehealth: Payer: Self-pay | Admitting: Internal Medicine

## 2022-05-03 ENCOUNTER — Ambulatory Visit: Payer: Medicare Other | Admitting: Podiatry

## 2022-05-03 NOTE — Telephone Encounter (Signed)
Called Kayla Thompson no answer LMOM w/MD response.Marland KitchenJohny Thompson

## 2022-05-03 NOTE — Telephone Encounter (Signed)
[name] from [facility] called to re-certify hospice care for the pt. Pt is still eligible and they need verbal confirmation to re-certify and keep Dr. Sharlet Salina as the attending.   Please call [name]: [phone]

## 2022-05-03 NOTE — Telephone Encounter (Signed)
Fine to re-certify.

## 2022-05-06 DIAGNOSIS — I509 Heart failure, unspecified: Secondary | ICD-10-CM | POA: Diagnosis not present

## 2022-05-06 DIAGNOSIS — R32 Unspecified urinary incontinence: Secondary | ICD-10-CM | POA: Diagnosis not present

## 2022-05-06 DIAGNOSIS — E43 Unspecified severe protein-calorie malnutrition: Secondary | ICD-10-CM | POA: Diagnosis not present

## 2022-05-06 DIAGNOSIS — F32A Depression, unspecified: Secondary | ICD-10-CM | POA: Diagnosis not present

## 2022-05-06 DIAGNOSIS — J449 Chronic obstructive pulmonary disease, unspecified: Secondary | ICD-10-CM | POA: Diagnosis not present

## 2022-05-06 DIAGNOSIS — K219 Gastro-esophageal reflux disease without esophagitis: Secondary | ICD-10-CM | POA: Diagnosis not present

## 2022-05-07 DIAGNOSIS — F32A Depression, unspecified: Secondary | ICD-10-CM | POA: Diagnosis not present

## 2022-05-07 DIAGNOSIS — R32 Unspecified urinary incontinence: Secondary | ICD-10-CM | POA: Diagnosis not present

## 2022-05-07 DIAGNOSIS — K219 Gastro-esophageal reflux disease without esophagitis: Secondary | ICD-10-CM | POA: Diagnosis not present

## 2022-05-07 DIAGNOSIS — J449 Chronic obstructive pulmonary disease, unspecified: Secondary | ICD-10-CM | POA: Diagnosis not present

## 2022-05-07 DIAGNOSIS — E43 Unspecified severe protein-calorie malnutrition: Secondary | ICD-10-CM | POA: Diagnosis not present

## 2022-05-07 DIAGNOSIS — I509 Heart failure, unspecified: Secondary | ICD-10-CM | POA: Diagnosis not present

## 2022-05-11 DIAGNOSIS — R32 Unspecified urinary incontinence: Secondary | ICD-10-CM | POA: Diagnosis not present

## 2022-05-11 DIAGNOSIS — K219 Gastro-esophageal reflux disease without esophagitis: Secondary | ICD-10-CM | POA: Diagnosis not present

## 2022-05-11 DIAGNOSIS — F32A Depression, unspecified: Secondary | ICD-10-CM | POA: Diagnosis not present

## 2022-05-11 DIAGNOSIS — I509 Heart failure, unspecified: Secondary | ICD-10-CM | POA: Diagnosis not present

## 2022-05-11 DIAGNOSIS — E43 Unspecified severe protein-calorie malnutrition: Secondary | ICD-10-CM | POA: Diagnosis not present

## 2022-05-11 DIAGNOSIS — J449 Chronic obstructive pulmonary disease, unspecified: Secondary | ICD-10-CM | POA: Diagnosis not present

## 2022-05-12 ENCOUNTER — Other Ambulatory Visit: Payer: Self-pay | Admitting: Internal Medicine

## 2022-05-13 DIAGNOSIS — I509 Heart failure, unspecified: Secondary | ICD-10-CM | POA: Diagnosis not present

## 2022-05-13 DIAGNOSIS — R32 Unspecified urinary incontinence: Secondary | ICD-10-CM | POA: Diagnosis not present

## 2022-05-13 DIAGNOSIS — F32A Depression, unspecified: Secondary | ICD-10-CM | POA: Diagnosis not present

## 2022-05-13 DIAGNOSIS — J449 Chronic obstructive pulmonary disease, unspecified: Secondary | ICD-10-CM | POA: Diagnosis not present

## 2022-05-13 DIAGNOSIS — K219 Gastro-esophageal reflux disease without esophagitis: Secondary | ICD-10-CM | POA: Diagnosis not present

## 2022-05-13 DIAGNOSIS — E43 Unspecified severe protein-calorie malnutrition: Secondary | ICD-10-CM | POA: Diagnosis not present

## 2022-05-14 ENCOUNTER — Other Ambulatory Visit: Payer: Self-pay | Admitting: Gastroenterology

## 2022-05-14 DIAGNOSIS — R32 Unspecified urinary incontinence: Secondary | ICD-10-CM | POA: Diagnosis not present

## 2022-05-14 DIAGNOSIS — E43 Unspecified severe protein-calorie malnutrition: Secondary | ICD-10-CM | POA: Diagnosis not present

## 2022-05-14 DIAGNOSIS — J449 Chronic obstructive pulmonary disease, unspecified: Secondary | ICD-10-CM | POA: Diagnosis not present

## 2022-05-14 DIAGNOSIS — I509 Heart failure, unspecified: Secondary | ICD-10-CM | POA: Diagnosis not present

## 2022-05-14 DIAGNOSIS — F32A Depression, unspecified: Secondary | ICD-10-CM | POA: Diagnosis not present

## 2022-05-14 DIAGNOSIS — K219 Gastro-esophageal reflux disease without esophagitis: Secondary | ICD-10-CM | POA: Diagnosis not present

## 2022-05-17 DIAGNOSIS — R32 Unspecified urinary incontinence: Secondary | ICD-10-CM | POA: Diagnosis not present

## 2022-05-17 DIAGNOSIS — F32A Depression, unspecified: Secondary | ICD-10-CM | POA: Diagnosis not present

## 2022-05-17 DIAGNOSIS — K219 Gastro-esophageal reflux disease without esophagitis: Secondary | ICD-10-CM | POA: Diagnosis not present

## 2022-05-17 DIAGNOSIS — E43 Unspecified severe protein-calorie malnutrition: Secondary | ICD-10-CM | POA: Diagnosis not present

## 2022-05-17 DIAGNOSIS — I509 Heart failure, unspecified: Secondary | ICD-10-CM | POA: Diagnosis not present

## 2022-05-17 DIAGNOSIS — J449 Chronic obstructive pulmonary disease, unspecified: Secondary | ICD-10-CM | POA: Diagnosis not present

## 2022-05-20 DIAGNOSIS — K219 Gastro-esophageal reflux disease without esophagitis: Secondary | ICD-10-CM | POA: Diagnosis not present

## 2022-05-20 DIAGNOSIS — F32A Depression, unspecified: Secondary | ICD-10-CM | POA: Diagnosis not present

## 2022-05-20 DIAGNOSIS — E43 Unspecified severe protein-calorie malnutrition: Secondary | ICD-10-CM | POA: Diagnosis not present

## 2022-05-20 DIAGNOSIS — R32 Unspecified urinary incontinence: Secondary | ICD-10-CM | POA: Diagnosis not present

## 2022-05-20 DIAGNOSIS — I509 Heart failure, unspecified: Secondary | ICD-10-CM | POA: Diagnosis not present

## 2022-05-20 DIAGNOSIS — J449 Chronic obstructive pulmonary disease, unspecified: Secondary | ICD-10-CM | POA: Diagnosis not present

## 2022-05-21 DIAGNOSIS — I509 Heart failure, unspecified: Secondary | ICD-10-CM | POA: Diagnosis not present

## 2022-05-21 DIAGNOSIS — R32 Unspecified urinary incontinence: Secondary | ICD-10-CM | POA: Diagnosis not present

## 2022-05-21 DIAGNOSIS — K219 Gastro-esophageal reflux disease without esophagitis: Secondary | ICD-10-CM | POA: Diagnosis not present

## 2022-05-21 DIAGNOSIS — F32A Depression, unspecified: Secondary | ICD-10-CM | POA: Diagnosis not present

## 2022-05-21 DIAGNOSIS — Z87440 Personal history of urinary (tract) infections: Secondary | ICD-10-CM | POA: Diagnosis not present

## 2022-05-21 DIAGNOSIS — R159 Full incontinence of feces: Secondary | ICD-10-CM | POA: Diagnosis not present

## 2022-05-21 DIAGNOSIS — J302 Other seasonal allergic rhinitis: Secondary | ICD-10-CM | POA: Diagnosis not present

## 2022-05-21 DIAGNOSIS — E119 Type 2 diabetes mellitus without complications: Secondary | ICD-10-CM | POA: Diagnosis not present

## 2022-05-21 DIAGNOSIS — J449 Chronic obstructive pulmonary disease, unspecified: Secondary | ICD-10-CM | POA: Diagnosis not present

## 2022-05-21 DIAGNOSIS — M797 Fibromyalgia: Secondary | ICD-10-CM | POA: Diagnosis not present

## 2022-05-21 DIAGNOSIS — E43 Unspecified severe protein-calorie malnutrition: Secondary | ICD-10-CM | POA: Diagnosis not present

## 2022-05-21 DIAGNOSIS — E039 Hypothyroidism, unspecified: Secondary | ICD-10-CM | POA: Diagnosis not present

## 2022-05-21 DIAGNOSIS — E785 Hyperlipidemia, unspecified: Secondary | ICD-10-CM | POA: Diagnosis not present

## 2022-05-24 ENCOUNTER — Other Ambulatory Visit: Payer: Self-pay | Admitting: Internal Medicine

## 2022-05-24 DIAGNOSIS — E43 Unspecified severe protein-calorie malnutrition: Secondary | ICD-10-CM | POA: Diagnosis not present

## 2022-05-24 DIAGNOSIS — I509 Heart failure, unspecified: Secondary | ICD-10-CM | POA: Diagnosis not present

## 2022-05-24 DIAGNOSIS — R32 Unspecified urinary incontinence: Secondary | ICD-10-CM | POA: Diagnosis not present

## 2022-05-24 DIAGNOSIS — F32A Depression, unspecified: Secondary | ICD-10-CM | POA: Diagnosis not present

## 2022-05-24 DIAGNOSIS — J449 Chronic obstructive pulmonary disease, unspecified: Secondary | ICD-10-CM | POA: Diagnosis not present

## 2022-05-24 DIAGNOSIS — K219 Gastro-esophageal reflux disease without esophagitis: Secondary | ICD-10-CM | POA: Diagnosis not present

## 2022-05-27 DIAGNOSIS — K219 Gastro-esophageal reflux disease without esophagitis: Secondary | ICD-10-CM | POA: Diagnosis not present

## 2022-05-27 DIAGNOSIS — J449 Chronic obstructive pulmonary disease, unspecified: Secondary | ICD-10-CM | POA: Diagnosis not present

## 2022-05-27 DIAGNOSIS — F32A Depression, unspecified: Secondary | ICD-10-CM | POA: Diagnosis not present

## 2022-05-27 DIAGNOSIS — E43 Unspecified severe protein-calorie malnutrition: Secondary | ICD-10-CM | POA: Diagnosis not present

## 2022-05-27 DIAGNOSIS — R32 Unspecified urinary incontinence: Secondary | ICD-10-CM | POA: Diagnosis not present

## 2022-05-27 DIAGNOSIS — I509 Heart failure, unspecified: Secondary | ICD-10-CM | POA: Diagnosis not present

## 2022-05-28 ENCOUNTER — Other Ambulatory Visit: Payer: Self-pay | Admitting: Internal Medicine

## 2022-05-28 DIAGNOSIS — F32A Depression, unspecified: Secondary | ICD-10-CM | POA: Diagnosis not present

## 2022-05-28 DIAGNOSIS — E43 Unspecified severe protein-calorie malnutrition: Secondary | ICD-10-CM | POA: Diagnosis not present

## 2022-05-28 DIAGNOSIS — K219 Gastro-esophageal reflux disease without esophagitis: Secondary | ICD-10-CM | POA: Diagnosis not present

## 2022-05-28 DIAGNOSIS — R32 Unspecified urinary incontinence: Secondary | ICD-10-CM | POA: Diagnosis not present

## 2022-05-28 DIAGNOSIS — J449 Chronic obstructive pulmonary disease, unspecified: Secondary | ICD-10-CM | POA: Diagnosis not present

## 2022-05-28 DIAGNOSIS — I509 Heart failure, unspecified: Secondary | ICD-10-CM | POA: Diagnosis not present

## 2022-05-31 DIAGNOSIS — R32 Unspecified urinary incontinence: Secondary | ICD-10-CM | POA: Diagnosis not present

## 2022-05-31 DIAGNOSIS — F32A Depression, unspecified: Secondary | ICD-10-CM | POA: Diagnosis not present

## 2022-05-31 DIAGNOSIS — K219 Gastro-esophageal reflux disease without esophagitis: Secondary | ICD-10-CM | POA: Diagnosis not present

## 2022-05-31 DIAGNOSIS — I509 Heart failure, unspecified: Secondary | ICD-10-CM | POA: Diagnosis not present

## 2022-05-31 DIAGNOSIS — E43 Unspecified severe protein-calorie malnutrition: Secondary | ICD-10-CM | POA: Diagnosis not present

## 2022-05-31 DIAGNOSIS — J449 Chronic obstructive pulmonary disease, unspecified: Secondary | ICD-10-CM | POA: Diagnosis not present

## 2022-06-03 DIAGNOSIS — F32A Depression, unspecified: Secondary | ICD-10-CM | POA: Diagnosis not present

## 2022-06-03 DIAGNOSIS — R32 Unspecified urinary incontinence: Secondary | ICD-10-CM | POA: Diagnosis not present

## 2022-06-03 DIAGNOSIS — J449 Chronic obstructive pulmonary disease, unspecified: Secondary | ICD-10-CM | POA: Diagnosis not present

## 2022-06-03 DIAGNOSIS — I509 Heart failure, unspecified: Secondary | ICD-10-CM | POA: Diagnosis not present

## 2022-06-03 DIAGNOSIS — K219 Gastro-esophageal reflux disease without esophagitis: Secondary | ICD-10-CM | POA: Diagnosis not present

## 2022-06-03 DIAGNOSIS — E43 Unspecified severe protein-calorie malnutrition: Secondary | ICD-10-CM | POA: Diagnosis not present

## 2022-06-04 DIAGNOSIS — J449 Chronic obstructive pulmonary disease, unspecified: Secondary | ICD-10-CM | POA: Diagnosis not present

## 2022-06-04 DIAGNOSIS — I509 Heart failure, unspecified: Secondary | ICD-10-CM | POA: Diagnosis not present

## 2022-06-04 DIAGNOSIS — E43 Unspecified severe protein-calorie malnutrition: Secondary | ICD-10-CM | POA: Diagnosis not present

## 2022-06-04 DIAGNOSIS — K219 Gastro-esophageal reflux disease without esophagitis: Secondary | ICD-10-CM | POA: Diagnosis not present

## 2022-06-04 DIAGNOSIS — R32 Unspecified urinary incontinence: Secondary | ICD-10-CM | POA: Diagnosis not present

## 2022-06-04 DIAGNOSIS — F32A Depression, unspecified: Secondary | ICD-10-CM | POA: Diagnosis not present

## 2022-06-07 DIAGNOSIS — F32A Depression, unspecified: Secondary | ICD-10-CM | POA: Diagnosis not present

## 2022-06-07 DIAGNOSIS — I509 Heart failure, unspecified: Secondary | ICD-10-CM | POA: Diagnosis not present

## 2022-06-07 DIAGNOSIS — E43 Unspecified severe protein-calorie malnutrition: Secondary | ICD-10-CM | POA: Diagnosis not present

## 2022-06-07 DIAGNOSIS — J449 Chronic obstructive pulmonary disease, unspecified: Secondary | ICD-10-CM | POA: Diagnosis not present

## 2022-06-07 DIAGNOSIS — K219 Gastro-esophageal reflux disease without esophagitis: Secondary | ICD-10-CM | POA: Diagnosis not present

## 2022-06-07 DIAGNOSIS — R32 Unspecified urinary incontinence: Secondary | ICD-10-CM | POA: Diagnosis not present

## 2022-06-10 DIAGNOSIS — R32 Unspecified urinary incontinence: Secondary | ICD-10-CM | POA: Diagnosis not present

## 2022-06-10 DIAGNOSIS — I509 Heart failure, unspecified: Secondary | ICD-10-CM | POA: Diagnosis not present

## 2022-06-10 DIAGNOSIS — E43 Unspecified severe protein-calorie malnutrition: Secondary | ICD-10-CM | POA: Diagnosis not present

## 2022-06-10 DIAGNOSIS — F32A Depression, unspecified: Secondary | ICD-10-CM | POA: Diagnosis not present

## 2022-06-10 DIAGNOSIS — J449 Chronic obstructive pulmonary disease, unspecified: Secondary | ICD-10-CM | POA: Diagnosis not present

## 2022-06-10 DIAGNOSIS — K219 Gastro-esophageal reflux disease without esophagitis: Secondary | ICD-10-CM | POA: Diagnosis not present

## 2022-06-11 DIAGNOSIS — I509 Heart failure, unspecified: Secondary | ICD-10-CM | POA: Diagnosis not present

## 2022-06-11 DIAGNOSIS — F32A Depression, unspecified: Secondary | ICD-10-CM | POA: Diagnosis not present

## 2022-06-11 DIAGNOSIS — R32 Unspecified urinary incontinence: Secondary | ICD-10-CM | POA: Diagnosis not present

## 2022-06-11 DIAGNOSIS — K219 Gastro-esophageal reflux disease without esophagitis: Secondary | ICD-10-CM | POA: Diagnosis not present

## 2022-06-11 DIAGNOSIS — E43 Unspecified severe protein-calorie malnutrition: Secondary | ICD-10-CM | POA: Diagnosis not present

## 2022-06-11 DIAGNOSIS — J449 Chronic obstructive pulmonary disease, unspecified: Secondary | ICD-10-CM | POA: Diagnosis not present

## 2022-06-14 DIAGNOSIS — J449 Chronic obstructive pulmonary disease, unspecified: Secondary | ICD-10-CM | POA: Diagnosis not present

## 2022-06-14 DIAGNOSIS — R32 Unspecified urinary incontinence: Secondary | ICD-10-CM | POA: Diagnosis not present

## 2022-06-14 DIAGNOSIS — I509 Heart failure, unspecified: Secondary | ICD-10-CM | POA: Diagnosis not present

## 2022-06-14 DIAGNOSIS — K219 Gastro-esophageal reflux disease without esophagitis: Secondary | ICD-10-CM | POA: Diagnosis not present

## 2022-06-14 DIAGNOSIS — F32A Depression, unspecified: Secondary | ICD-10-CM | POA: Diagnosis not present

## 2022-06-14 DIAGNOSIS — E43 Unspecified severe protein-calorie malnutrition: Secondary | ICD-10-CM | POA: Diagnosis not present

## 2022-06-16 ENCOUNTER — Telehealth: Payer: Self-pay | Admitting: Internal Medicine

## 2022-06-16 NOTE — Telephone Encounter (Signed)
Angie from [facility] called to check on the status of Home Health Certification and Plan of Care. She said she's faxed it in December and 06/11/22.  She would like a call back at [phone]) to let her know that it has been received. She just resent it today 06/16/22.

## 2022-06-17 DIAGNOSIS — F32A Depression, unspecified: Secondary | ICD-10-CM | POA: Diagnosis not present

## 2022-06-17 DIAGNOSIS — I509 Heart failure, unspecified: Secondary | ICD-10-CM | POA: Diagnosis not present

## 2022-06-17 DIAGNOSIS — J449 Chronic obstructive pulmonary disease, unspecified: Secondary | ICD-10-CM | POA: Diagnosis not present

## 2022-06-17 DIAGNOSIS — E43 Unspecified severe protein-calorie malnutrition: Secondary | ICD-10-CM | POA: Diagnosis not present

## 2022-06-17 DIAGNOSIS — K219 Gastro-esophageal reflux disease without esophagitis: Secondary | ICD-10-CM | POA: Diagnosis not present

## 2022-06-17 DIAGNOSIS — R32 Unspecified urinary incontinence: Secondary | ICD-10-CM | POA: Diagnosis not present

## 2022-06-18 DIAGNOSIS — I509 Heart failure, unspecified: Secondary | ICD-10-CM | POA: Diagnosis not present

## 2022-06-18 DIAGNOSIS — R32 Unspecified urinary incontinence: Secondary | ICD-10-CM | POA: Diagnosis not present

## 2022-06-18 DIAGNOSIS — J449 Chronic obstructive pulmonary disease, unspecified: Secondary | ICD-10-CM | POA: Diagnosis not present

## 2022-06-18 DIAGNOSIS — K219 Gastro-esophageal reflux disease without esophagitis: Secondary | ICD-10-CM | POA: Diagnosis not present

## 2022-06-18 DIAGNOSIS — E43 Unspecified severe protein-calorie malnutrition: Secondary | ICD-10-CM | POA: Diagnosis not present

## 2022-06-18 DIAGNOSIS — F32A Depression, unspecified: Secondary | ICD-10-CM | POA: Diagnosis not present

## 2022-06-21 DIAGNOSIS — F32A Depression, unspecified: Secondary | ICD-10-CM | POA: Diagnosis not present

## 2022-06-21 DIAGNOSIS — J449 Chronic obstructive pulmonary disease, unspecified: Secondary | ICD-10-CM | POA: Diagnosis not present

## 2022-06-21 DIAGNOSIS — R32 Unspecified urinary incontinence: Secondary | ICD-10-CM | POA: Diagnosis not present

## 2022-06-21 DIAGNOSIS — J302 Other seasonal allergic rhinitis: Secondary | ICD-10-CM | POA: Diagnosis not present

## 2022-06-21 DIAGNOSIS — R159 Full incontinence of feces: Secondary | ICD-10-CM | POA: Diagnosis not present

## 2022-06-21 DIAGNOSIS — E119 Type 2 diabetes mellitus without complications: Secondary | ICD-10-CM | POA: Diagnosis not present

## 2022-06-21 DIAGNOSIS — I509 Heart failure, unspecified: Secondary | ICD-10-CM | POA: Diagnosis not present

## 2022-06-21 DIAGNOSIS — E039 Hypothyroidism, unspecified: Secondary | ICD-10-CM | POA: Diagnosis not present

## 2022-06-21 DIAGNOSIS — K219 Gastro-esophageal reflux disease without esophagitis: Secondary | ICD-10-CM | POA: Diagnosis not present

## 2022-06-21 DIAGNOSIS — E785 Hyperlipidemia, unspecified: Secondary | ICD-10-CM | POA: Diagnosis not present

## 2022-06-21 DIAGNOSIS — M797 Fibromyalgia: Secondary | ICD-10-CM | POA: Diagnosis not present

## 2022-06-21 DIAGNOSIS — E43 Unspecified severe protein-calorie malnutrition: Secondary | ICD-10-CM | POA: Diagnosis not present

## 2022-06-21 DIAGNOSIS — Z87440 Personal history of urinary (tract) infections: Secondary | ICD-10-CM | POA: Diagnosis not present

## 2022-06-21 NOTE — Telephone Encounter (Signed)
Spoke with angie and stated to her that since patient has not been seen by her provider since 01/23 unfortunately she will not be able to sign due to her not being seen face to face.

## 2022-06-22 DIAGNOSIS — F32A Depression, unspecified: Secondary | ICD-10-CM | POA: Diagnosis not present

## 2022-06-22 DIAGNOSIS — E43 Unspecified severe protein-calorie malnutrition: Secondary | ICD-10-CM | POA: Diagnosis not present

## 2022-06-22 DIAGNOSIS — I509 Heart failure, unspecified: Secondary | ICD-10-CM | POA: Diagnosis not present

## 2022-06-22 DIAGNOSIS — J449 Chronic obstructive pulmonary disease, unspecified: Secondary | ICD-10-CM | POA: Diagnosis not present

## 2022-06-22 DIAGNOSIS — K219 Gastro-esophageal reflux disease without esophagitis: Secondary | ICD-10-CM | POA: Diagnosis not present

## 2022-06-22 DIAGNOSIS — R32 Unspecified urinary incontinence: Secondary | ICD-10-CM | POA: Diagnosis not present

## 2022-06-23 DIAGNOSIS — R32 Unspecified urinary incontinence: Secondary | ICD-10-CM | POA: Diagnosis not present

## 2022-06-23 DIAGNOSIS — F32A Depression, unspecified: Secondary | ICD-10-CM | POA: Diagnosis not present

## 2022-06-23 DIAGNOSIS — E43 Unspecified severe protein-calorie malnutrition: Secondary | ICD-10-CM | POA: Diagnosis not present

## 2022-06-23 DIAGNOSIS — I509 Heart failure, unspecified: Secondary | ICD-10-CM | POA: Diagnosis not present

## 2022-06-23 DIAGNOSIS — K219 Gastro-esophageal reflux disease without esophagitis: Secondary | ICD-10-CM | POA: Diagnosis not present

## 2022-06-23 DIAGNOSIS — J449 Chronic obstructive pulmonary disease, unspecified: Secondary | ICD-10-CM | POA: Diagnosis not present

## 2022-06-24 DIAGNOSIS — I509 Heart failure, unspecified: Secondary | ICD-10-CM | POA: Diagnosis not present

## 2022-06-24 DIAGNOSIS — K219 Gastro-esophageal reflux disease without esophagitis: Secondary | ICD-10-CM | POA: Diagnosis not present

## 2022-06-24 DIAGNOSIS — E43 Unspecified severe protein-calorie malnutrition: Secondary | ICD-10-CM | POA: Diagnosis not present

## 2022-06-24 DIAGNOSIS — F32A Depression, unspecified: Secondary | ICD-10-CM | POA: Diagnosis not present

## 2022-06-24 DIAGNOSIS — J449 Chronic obstructive pulmonary disease, unspecified: Secondary | ICD-10-CM | POA: Diagnosis not present

## 2022-06-24 DIAGNOSIS — R32 Unspecified urinary incontinence: Secondary | ICD-10-CM | POA: Diagnosis not present

## 2022-06-25 DIAGNOSIS — K219 Gastro-esophageal reflux disease without esophagitis: Secondary | ICD-10-CM | POA: Diagnosis not present

## 2022-06-25 DIAGNOSIS — J449 Chronic obstructive pulmonary disease, unspecified: Secondary | ICD-10-CM | POA: Diagnosis not present

## 2022-06-25 DIAGNOSIS — F32A Depression, unspecified: Secondary | ICD-10-CM | POA: Diagnosis not present

## 2022-06-25 DIAGNOSIS — E43 Unspecified severe protein-calorie malnutrition: Secondary | ICD-10-CM | POA: Diagnosis not present

## 2022-06-25 DIAGNOSIS — R32 Unspecified urinary incontinence: Secondary | ICD-10-CM | POA: Diagnosis not present

## 2022-06-25 DIAGNOSIS — I509 Heart failure, unspecified: Secondary | ICD-10-CM | POA: Diagnosis not present

## 2022-06-26 DIAGNOSIS — J449 Chronic obstructive pulmonary disease, unspecified: Secondary | ICD-10-CM | POA: Diagnosis not present

## 2022-06-26 DIAGNOSIS — R32 Unspecified urinary incontinence: Secondary | ICD-10-CM | POA: Diagnosis not present

## 2022-06-26 DIAGNOSIS — K219 Gastro-esophageal reflux disease without esophagitis: Secondary | ICD-10-CM | POA: Diagnosis not present

## 2022-06-26 DIAGNOSIS — E43 Unspecified severe protein-calorie malnutrition: Secondary | ICD-10-CM | POA: Diagnosis not present

## 2022-06-26 DIAGNOSIS — F32A Depression, unspecified: Secondary | ICD-10-CM | POA: Diagnosis not present

## 2022-06-26 DIAGNOSIS — I509 Heart failure, unspecified: Secondary | ICD-10-CM | POA: Diagnosis not present

## 2022-06-27 DIAGNOSIS — E43 Unspecified severe protein-calorie malnutrition: Secondary | ICD-10-CM | POA: Diagnosis not present

## 2022-06-27 DIAGNOSIS — K219 Gastro-esophageal reflux disease without esophagitis: Secondary | ICD-10-CM | POA: Diagnosis not present

## 2022-06-27 DIAGNOSIS — I509 Heart failure, unspecified: Secondary | ICD-10-CM | POA: Diagnosis not present

## 2022-06-27 DIAGNOSIS — F32A Depression, unspecified: Secondary | ICD-10-CM | POA: Diagnosis not present

## 2022-06-27 DIAGNOSIS — J449 Chronic obstructive pulmonary disease, unspecified: Secondary | ICD-10-CM | POA: Diagnosis not present

## 2022-06-27 DIAGNOSIS — R32 Unspecified urinary incontinence: Secondary | ICD-10-CM | POA: Diagnosis not present

## 2022-06-28 DIAGNOSIS — I509 Heart failure, unspecified: Secondary | ICD-10-CM | POA: Diagnosis not present

## 2022-06-28 DIAGNOSIS — R32 Unspecified urinary incontinence: Secondary | ICD-10-CM | POA: Diagnosis not present

## 2022-06-28 DIAGNOSIS — E43 Unspecified severe protein-calorie malnutrition: Secondary | ICD-10-CM | POA: Diagnosis not present

## 2022-06-28 DIAGNOSIS — F32A Depression, unspecified: Secondary | ICD-10-CM | POA: Diagnosis not present

## 2022-06-28 DIAGNOSIS — K219 Gastro-esophageal reflux disease without esophagitis: Secondary | ICD-10-CM | POA: Diagnosis not present

## 2022-06-28 DIAGNOSIS — J449 Chronic obstructive pulmonary disease, unspecified: Secondary | ICD-10-CM | POA: Diagnosis not present

## 2022-06-29 DIAGNOSIS — F32A Depression, unspecified: Secondary | ICD-10-CM | POA: Diagnosis not present

## 2022-06-29 DIAGNOSIS — I509 Heart failure, unspecified: Secondary | ICD-10-CM | POA: Diagnosis not present

## 2022-06-29 DIAGNOSIS — E43 Unspecified severe protein-calorie malnutrition: Secondary | ICD-10-CM | POA: Diagnosis not present

## 2022-06-29 DIAGNOSIS — K219 Gastro-esophageal reflux disease without esophagitis: Secondary | ICD-10-CM | POA: Diagnosis not present

## 2022-06-29 DIAGNOSIS — J449 Chronic obstructive pulmonary disease, unspecified: Secondary | ICD-10-CM | POA: Diagnosis not present

## 2022-06-29 DIAGNOSIS — R32 Unspecified urinary incontinence: Secondary | ICD-10-CM | POA: Diagnosis not present

## 2022-06-30 DIAGNOSIS — K219 Gastro-esophageal reflux disease without esophagitis: Secondary | ICD-10-CM | POA: Diagnosis not present

## 2022-06-30 DIAGNOSIS — I509 Heart failure, unspecified: Secondary | ICD-10-CM | POA: Diagnosis not present

## 2022-06-30 DIAGNOSIS — J449 Chronic obstructive pulmonary disease, unspecified: Secondary | ICD-10-CM | POA: Diagnosis not present

## 2022-06-30 DIAGNOSIS — R32 Unspecified urinary incontinence: Secondary | ICD-10-CM | POA: Diagnosis not present

## 2022-06-30 DIAGNOSIS — F32A Depression, unspecified: Secondary | ICD-10-CM | POA: Diagnosis not present

## 2022-06-30 DIAGNOSIS — E43 Unspecified severe protein-calorie malnutrition: Secondary | ICD-10-CM | POA: Diagnosis not present

## 2022-07-01 DIAGNOSIS — K219 Gastro-esophageal reflux disease without esophagitis: Secondary | ICD-10-CM | POA: Diagnosis not present

## 2022-07-01 DIAGNOSIS — E43 Unspecified severe protein-calorie malnutrition: Secondary | ICD-10-CM | POA: Diagnosis not present

## 2022-07-01 DIAGNOSIS — F32A Depression, unspecified: Secondary | ICD-10-CM | POA: Diagnosis not present

## 2022-07-01 DIAGNOSIS — J449 Chronic obstructive pulmonary disease, unspecified: Secondary | ICD-10-CM | POA: Diagnosis not present

## 2022-07-01 DIAGNOSIS — I509 Heart failure, unspecified: Secondary | ICD-10-CM | POA: Diagnosis not present

## 2022-07-01 DIAGNOSIS — R32 Unspecified urinary incontinence: Secondary | ICD-10-CM | POA: Diagnosis not present

## 2022-07-02 ENCOUNTER — Telehealth: Payer: Self-pay | Admitting: Internal Medicine

## 2022-07-02 NOTE — Telephone Encounter (Signed)
Darl Pikes from [facility] hospice reported that the pt passed last night (4.11.24) at home. States they will fax official paperwork later today.   For any questions please call Darl Pikes:  [phone]

## 2022-07-21 DEATH — deceased
